# Patient Record
Sex: Female | Born: 1950 | Race: White | Hispanic: No | Smoking: Former smoker
Health system: Southern US, Community
[De-identification: ages and names within clinical notes are randomized; demographics above are authoritative.]

## PROBLEM LIST (undated history)

## (undated) DIAGNOSIS — I219 Acute myocardial infarction, unspecified: Secondary | ICD-10-CM

## (undated) DIAGNOSIS — I509 Heart failure, unspecified: Secondary | ICD-10-CM

## (undated) DIAGNOSIS — I10 Essential (primary) hypertension: Secondary | ICD-10-CM

## (undated) DIAGNOSIS — E119 Type 2 diabetes mellitus without complications: Secondary | ICD-10-CM

## (undated) DIAGNOSIS — T4145XA Adverse effect of unspecified anesthetic, initial encounter: Secondary | ICD-10-CM

## (undated) DIAGNOSIS — I739 Peripheral vascular disease, unspecified: Secondary | ICD-10-CM

## (undated) DIAGNOSIS — D649 Anemia, unspecified: Secondary | ICD-10-CM

## (undated) DIAGNOSIS — M7070 Other bursitis of hip, unspecified hip: Secondary | ICD-10-CM

## (undated) DIAGNOSIS — Z9289 Personal history of other medical treatment: Secondary | ICD-10-CM

## (undated) DIAGNOSIS — E46 Unspecified protein-calorie malnutrition: Secondary | ICD-10-CM

## (undated) DIAGNOSIS — R6521 Severe sepsis with septic shock: Secondary | ICD-10-CM

## (undated) DIAGNOSIS — I639 Cerebral infarction, unspecified: Secondary | ICD-10-CM

## (undated) DIAGNOSIS — J9690 Respiratory failure, unspecified, unspecified whether with hypoxia or hypercapnia: Secondary | ICD-10-CM

## (undated) DIAGNOSIS — M1990 Unspecified osteoarthritis, unspecified site: Secondary | ICD-10-CM

## (undated) DIAGNOSIS — T8859XA Other complications of anesthesia, initial encounter: Secondary | ICD-10-CM

## (undated) DIAGNOSIS — I2510 Atherosclerotic heart disease of native coronary artery without angina pectoris: Secondary | ICD-10-CM

## (undated) DIAGNOSIS — G43909 Migraine, unspecified, not intractable, without status migrainosus: Secondary | ICD-10-CM

## (undated) DIAGNOSIS — M869 Osteomyelitis, unspecified: Secondary | ICD-10-CM

## (undated) DIAGNOSIS — R0902 Hypoxemia: Secondary | ICD-10-CM

## (undated) DIAGNOSIS — G709 Myoneural disorder, unspecified: Secondary | ICD-10-CM

## (undated) DIAGNOSIS — A419 Sepsis, unspecified organism: Secondary | ICD-10-CM

## (undated) DIAGNOSIS — K219 Gastro-esophageal reflux disease without esophagitis: Secondary | ICD-10-CM

## (undated) DIAGNOSIS — Z8489 Family history of other specified conditions: Secondary | ICD-10-CM

## (undated) DIAGNOSIS — E785 Hyperlipidemia, unspecified: Secondary | ICD-10-CM

## (undated) HISTORY — PX: APPENDECTOMY: SHX54

## (undated) HISTORY — PX: CORONARY ANGIOPLASTY: SHX604

## (undated) HISTORY — DX: Hyperlipidemia, unspecified: E78.5

## (undated) HISTORY — PX: OVARY SURGERY: SHX727

## (undated) HISTORY — PX: ABDOMINAL HYSTERECTOMY: SHX81

## (undated) HISTORY — PX: CATARACT EXTRACTION W/ INTRAOCULAR LENS IMPLANT: SHX1309

## (undated) SURGERY — Surgical Case
Anesthesia: *Unknown

---

## 1999-05-07 ENCOUNTER — Other Ambulatory Visit: Admission: RE | Admit: 1999-05-07 | Discharge: 1999-05-07 | Payer: Self-pay | Admitting: *Deleted

## 2000-07-08 ENCOUNTER — Other Ambulatory Visit: Admission: RE | Admit: 2000-07-08 | Discharge: 2000-07-08 | Payer: Self-pay | Admitting: *Deleted

## 2003-05-02 ENCOUNTER — Encounter: Payer: Self-pay | Admitting: Emergency Medicine

## 2003-05-02 ENCOUNTER — Inpatient Hospital Stay (HOSPITAL_COMMUNITY): Admission: AD | Admit: 2003-05-02 | Discharge: 2003-05-03 | Payer: Self-pay | Admitting: Pediatrics

## 2003-05-05 ENCOUNTER — Encounter: Admission: RE | Admit: 2003-05-05 | Discharge: 2003-08-03 | Payer: Self-pay | Admitting: Pediatrics

## 2005-07-04 ENCOUNTER — Emergency Department (HOSPITAL_COMMUNITY): Admission: EM | Admit: 2005-07-04 | Discharge: 2005-07-04 | Payer: Self-pay | Admitting: Family Medicine

## 2007-03-12 ENCOUNTER — Emergency Department (HOSPITAL_COMMUNITY): Admission: EM | Admit: 2007-03-12 | Discharge: 2007-03-12 | Payer: Self-pay | Admitting: Emergency Medicine

## 2008-06-23 ENCOUNTER — Other Ambulatory Visit: Admission: RE | Admit: 2008-06-23 | Discharge: 2008-06-23 | Payer: Self-pay | Admitting: Internal Medicine

## 2008-07-06 ENCOUNTER — Encounter: Admission: RE | Admit: 2008-07-06 | Discharge: 2008-07-06 | Payer: Self-pay | Admitting: Internal Medicine

## 2008-08-04 ENCOUNTER — Encounter: Admission: RE | Admit: 2008-08-04 | Discharge: 2008-08-04 | Payer: Self-pay | Admitting: Internal Medicine

## 2008-08-16 ENCOUNTER — Encounter: Admission: RE | Admit: 2008-08-16 | Discharge: 2008-08-16 | Payer: Self-pay | Admitting: Internal Medicine

## 2009-01-28 DIAGNOSIS — I219 Acute myocardial infarction, unspecified: Secondary | ICD-10-CM

## 2009-01-28 HISTORY — DX: Acute myocardial infarction, unspecified: I21.9

## 2009-10-13 ENCOUNTER — Inpatient Hospital Stay (HOSPITAL_COMMUNITY): Admission: EM | Admit: 2009-10-13 | Discharge: 2009-10-18 | Payer: Self-pay | Admitting: Emergency Medicine

## 2009-10-13 HISTORY — PX: CARDIAC CATHETERIZATION: SHX172

## 2009-10-16 ENCOUNTER — Encounter (INDEPENDENT_AMBULATORY_CARE_PROVIDER_SITE_OTHER): Payer: Self-pay | Admitting: Cardiovascular Disease

## 2009-10-17 HISTORY — PX: TRANSTHORACIC ECHOCARDIOGRAM: SHX275

## 2010-01-26 ENCOUNTER — Ambulatory Visit (HOSPITAL_COMMUNITY)
Admission: RE | Admit: 2010-01-26 | Discharge: 2010-01-26 | Payer: Self-pay | Source: Home / Self Care | Attending: Cardiovascular Disease | Admitting: Cardiovascular Disease

## 2010-01-26 HISTORY — PX: OTHER SURGICAL HISTORY: SHX169

## 2010-02-18 ENCOUNTER — Encounter: Payer: Self-pay | Admitting: Internal Medicine

## 2010-02-19 ENCOUNTER — Encounter: Payer: Self-pay | Admitting: Internal Medicine

## 2010-02-23 ENCOUNTER — Inpatient Hospital Stay (HOSPITAL_COMMUNITY)
Admission: RE | Admit: 2010-02-23 | Discharge: 2010-02-24 | Payer: Self-pay | Source: Home / Self Care | Attending: Cardiovascular Disease | Admitting: Cardiovascular Disease

## 2010-02-23 HISTORY — PX: OTHER SURGICAL HISTORY: SHX169

## 2010-02-23 LAB — CBC
HCT: 38.5 % (ref 36.0–46.0)
Hemoglobin: 13.2 g/dL (ref 12.0–15.0)
MCH: 29.9 pg (ref 26.0–34.0)
MCHC: 34.3 g/dL (ref 30.0–36.0)
MCV: 87.1 fL (ref 78.0–100.0)
Platelets: 180 K/uL (ref 150–400)
RBC: 4.42 MIL/uL (ref 3.87–5.11)
RDW: 12.7 % (ref 11.5–15.5)
WBC: 7.5 K/uL (ref 4.0–10.5)

## 2010-02-23 LAB — POCT ACTIVATED CLOTTING TIME: Activated Clotting Time: 435 s

## 2010-02-23 LAB — BASIC METABOLIC PANEL WITH GFR
BUN: 10 mg/dL (ref 6–23)
CO2: 25 meq/L (ref 19–32)
Calcium: 9.1 mg/dL (ref 8.4–10.5)
Chloride: 106 meq/L (ref 96–112)
Creatinine, Ser: 0.68 mg/dL (ref 0.4–1.2)
GFR calc Af Amer: >60 mL/min (ref >60)
GFR calc non Af Amer: >60 mL/min (ref >60)
Glucose, Bld: 157 mg/dL — ABNORMAL HIGH (ref 70–99)
Potassium: 4.8 meq/L (ref 3.5–5.1)
Sodium: 140 meq/L (ref 135–145)

## 2010-02-23 LAB — GLUCOSE, CAPILLARY: Glucose-Capillary: 151 mg/dL — ABNORMAL HIGH (ref 70–99)

## 2010-02-23 LAB — PROTIME-INR: INR: 1.04 (ref 0.00–1.49)

## 2010-02-24 LAB — BASIC METABOLIC PANEL
Calcium: 8.9 mg/dL (ref 8.4–10.5)
GFR calc Af Amer: >60 mL/min (ref >60)
GFR calc non Af Amer: >60 mL/min (ref >60)
Glucose, Bld: 144 mg/dL — ABNORMAL HIGH (ref 70–99)

## 2010-02-24 LAB — CBC
Hemoglobin: 12 g/dL (ref 12.0–15.0)
Platelets: 158 10*3/uL (ref 150–400)
RBC: 3.98 MIL/uL (ref 3.87–5.11)
WBC: 8.2 10*3/uL (ref 4.0–10.5)

## 2010-03-09 NOTE — Procedures (Signed)
[name], NOBILE [name]  [id number]  MEDICAL RECORD [name]  [id number]          PATIENT TYPE:  INP  LOCATION:  6525                         FACILITY:  [facility]  PHYSICIAN:  [name] [name], M.D.   DATE OF BIRTH:  February 09, 1950  DATE OF PROCEDURE:  02/23/2010 DATE OF DISCHARGE:                   PERIPHERAL VASCULAR INVASIVE PROCEDURE   PROCEDURES:  Right lower extremity angiogram/Diamondback orbital rotational atherectomy/percutaneous transluminal angioplasty and stent.  [name] is a 60 year old mildly overweight, married Caucasian female, mother to one daughter.  She was referred to me by Dr. Tresa Endo for [facility] evaluation.  She has a history of acute myocardial infarction secondary to occluded dominant circumflex.  She underwent PCI and stenting with a Promus drug-eluting stent by Dr. Tresa Endo on October 13, 2009.  She did have some inferior and lateral wall motion abnormalities and found an EF by 2-D echo of 45-50%.  Risk factors include continued tobacco abuse for approximately 40 years, diabetes, hypertension, hyperlipidemia as well as family history.  She was complaining of claudication in the last 2 years, worse in the last 6 months, left greater than right, less than 1 block.  Dopplers performed in our office on December 18, 2009, revealed ABIs in the 0.5 range with what appeared to be an occluded SFAs.  She also had an occluded right internal carotid artery.  Angiogram done on January 26, 2010, revealing an occluded left SFA at the origin down to the [facility] canal three vessel runoff.  She had 90 plus percent calcified distal and mid right SFA stenosis with two-vessel runoff.  She presents now for [facility] orbital rotational atherectomy, PTA and stenting of her popliteal and SFA for lifestyle-limiting claudication.  PROCEDURE DESCRIPTION:  The patient was brought to the Second Floor [facility] Angiographic Suite in the post absorptive state.  She  was premedicated with p.o. Valium, IV Versed, and fentanyl.  Her left groin was prepped and shaved in the usual sterile fashion.  Xylocaine 1% was used for local anesthesia.  A 7-French crossover sheath was inserted into the left femoral artery using standard Seldinger technique.  A 5- [place] crossover catheter was used for contralateral access.  The patient received Angiomax bolus with an ACT of 425.  She was on aspirin and Effient.  The SFA and popliteal were crossed with a 0.014 Spartacore 3.0 semi length wire along with an 0.018 Quick-Cross catheter.  This was placed in the below-the-knee pop.  Following this,  Spartacore wire was exchanged through the Quick-Cross for a 0.014 ViperWire.  Visipaque dye was used for the entirety of the case.  Retrograde aortic pressures were monitored during the case.  Using a 2.0 Stealth Predator orbital rotational atherectomy bur, atherectomy was performed of the mid and distal SFA and popliteal lesion up to 90,000 rpm.  Postdilatation was then performed with a 4 x 80 FoxCross in the popliteal and the SFA area.  Angiography revealed diffuse disease that was high-grade and flow-limiting.  It should be noted that there also was a physiologically significant lesion in the proximal right external iliac artery with at least a 50-75 mm pressure gradient.  Following this, overlapping  5 x 100-mm long Absolute Pro nitinol self-expanding stents were deployed beginning at the area just at the knee, extending back into the mid SFA with the final stenting of 5 x 60 Absolute Pro.  Postdilatation was performed with a 5 x 100 FoxCross balloon at 4-6 atmospheres resulting in reduction of long 90- 95% calcified diffusely diseased SFA and popliteal stenosis to 0% residual with excellent flow.  The sheath was then withdrawn just to the bifurcation and stenting was performed of the distal common and proximal external iliac artery with a 10 x 4 Absolute Pro nitinol  self-expanding stent with gentle postdilatation using a 7 x 3 balloon resulting in reduction of 70% lesion to 0% residual.  The patient tolerated the procedure well.  There was a palpable pulse at the end of the case.  The sheath was then withdrawn across the bifurcation into the left iliac system.  The patient left the lab in stable condition.  Sheath will be removed once the ACT falls below 150.  The patient will remain on aspirin and Effient, will be hydrated overnight and discharged home in the morning.  I will see back in the office in approximately 1 week for followup.  She may need staged left SFA intervention.     [name] [name], M.D.     JB/MEDQ  D:  02/23/2010  T:  02/24/2010  Job:  [id number]  cc:   Second Floor PV Angiographic Suite [facility] Nicki Guadalajara, M.D. Georgann Housekeeper, MD  Electronically Signed by [name] [name] M.D. on 03/09/2010 08:06:53 AM

## 2010-03-09 NOTE — Discharge Summary (Signed)
  NAMELATALIA, [name] [name]                  ACCOUNT NO.:  [id number]  MEDICAL RECORD NO.:  [id number]          PATIENT TYPE:  INP  LOCATION:  6525                         FACILITY:  [facility]  PHYSICIAN:  Nanetta Batty, M.D.   DATE OF BIRTH:  02/02/1950  DATE OF ADMISSION:  02/23/2010 DATE OF DISCHARGE:  02/24/2010                              DISCHARGE SUMMARY   DISCHARGE DIAGNOSES: 1. Claudication. 2. Peripheral vascular disease with occluded superficial femoral     artery.     a.     Diamondback atherectomy and percutaneous transluminal      angioplasty and stent to the right popliteal superficial femoral      artery reducing 95% stenosis to 0.     b.     Percutaneous transluminal angioplasty  stent to the right AI      is 50% stenosis resist to 0. 3. Coronary artery disease with history of myocardial infarction, drug-     eluting stent to the left circumflex in September 2011. 4. Diabetes mellitus. 5. Dyslipidemia.  DISCHARGE CONDITION:  Improved.  PROCEDURE:  Peripheral angiogram as well as percutaneous transluminal angioplasty and stent as stated on February 23, 2010.  DISCHARGE MEDICATIONS:  See medication reconciliation sheet.  We will hold her metformin until February 27, 2010.  DISCHARGE INSTRUCTIONS: 1. Follow up with Dr. Allyson Sabal on March 08, 2010. 2. Follow up with lower extremity Dopplers on March 06, 2010, at 3     p.m.  HOSPITAL COURSE:  The patient presented electively to short stay and underwent PV angiogram by Dr. Allyson Sabal secondary to claudication.  Please see his dictated report.  She has been stable post procedure.  She did bleed some in her groin site.  Extra pressure was held.  She was ambulating without problems prior to discharge.  LABS:  Sodium 138, potassium 4.4, BUN 12, creatinine 0.81, glucose 144. Hemoglobin 12, hematocrit 34.2, platelets 158, WBC 8.2. Blood pressure 107/53.  She is stable and will be discharged home.  Dr. Allyson Sabal has seen her in the  morning of discharge.     Darcella Gasman. Annie Paras, N.P.   ______________________________ Nanetta Batty, M.D.    LRI/MEDQ  D:  02/24/2010  T:  02/25/2010  Job:  [id number]  cc:   Georgann Housekeeper, MD Nicki Guadalajara, M.D.  Electronically Signed by Nada Boozer N.P. on 02/26/2010 03:20:44 PM Electronically Signed by Nanetta Batty M.D. on 03/09/2010 08:06:51 AM

## 2010-04-09 LAB — GLUCOSE, CAPILLARY
Glucose-Capillary: 130 mg/dL — ABNORMAL HIGH (ref 70–99)
Glucose-Capillary: 133 mg/dL — ABNORMAL HIGH (ref 70–99)

## 2010-04-12 LAB — POCT I-STAT, CHEM 8
BUN: 14 mg/dL (ref 6–23)
Chloride: 106 mEq/L (ref 96–112)
Potassium: 3.9 mEq/L (ref 3.5–5.1)
Sodium: 138 mEq/L (ref 135–145)

## 2010-04-12 LAB — BASIC METABOLIC PANEL
BUN: 4 mg/dL — ABNORMAL LOW (ref 6–23)
BUN: 4 mg/dL — ABNORMAL LOW (ref 6–23)
CO2: 22 mEq/L (ref 19–32)
CO2: 23 mEq/L (ref 19–32)
Calcium: 8.6 mg/dL (ref 8.4–10.5)
Calcium: 9 mg/dL (ref 8.4–10.5)
Chloride: 104 mEq/L (ref 96–112)
Chloride: 108 mEq/L (ref 96–112)
Chloride: 109 mEq/L (ref 96–112)
Chloride: 110 mEq/L (ref 96–112)
Creatinine, Ser: 0.54 mg/dL (ref 0.4–1.2)
Creatinine, Ser: 0.56 mg/dL (ref 0.4–1.2)
Creatinine, Ser: 0.66 mg/dL (ref 0.4–1.2)
GFR calc Af Amer: >60 mL/min (ref >60)
GFR calc Af Amer: >60 mL/min (ref >60)
GFR calc non Af Amer: >60 mL/min (ref >60)
GFR calc non Af Amer: >60 mL/min (ref >60)
GFR calc non Af Amer: >60 mL/min (ref >60)
Glucose, Bld: 112 mg/dL — ABNORMAL HIGH (ref 70–99)
Glucose, Bld: 90 mg/dL (ref 70–99)
Potassium: 3.7 mEq/L (ref 3.5–5.1)
Potassium: 3.8 mEq/L (ref 3.5–5.1)
Sodium: 133 mEq/L — ABNORMAL LOW (ref 135–145)
Sodium: 138 mEq/L (ref 135–145)

## 2010-04-12 LAB — CBC
HCT: 33.1 % — ABNORMAL LOW (ref 36.0–46.0)
HCT: 37.4 % (ref 36.0–46.0)
HCT: 40.1 % (ref 36.0–46.0)
HCT: 44.1 % (ref 36.0–46.0)
Hemoglobin: 11.6 g/dL — ABNORMAL LOW (ref 12.0–15.0)
Hemoglobin: 11.9 g/dL — ABNORMAL LOW (ref 12.0–15.0)
Hemoglobin: 13 g/dL (ref 12.0–15.0)
Hemoglobin: 14.3 g/dL (ref 12.0–15.0)
MCH: 31.3 pg (ref 26.0–34.0)
MCH: 31.8 pg (ref 26.0–34.0)
MCH: 31.9 pg (ref 26.0–34.0)
MCHC: 35.8 g/dL (ref 30.0–36.0)
MCHC: 35.8 g/dL (ref 30.0–36.0)
MCV: 87.6 fL (ref 78.0–100.0)
MCV: 89.3 fL (ref 78.0–100.0)
MCV: 89.7 fL (ref 78.0–100.0)
MCV: 91.4 fL (ref 78.0–100.0)
Platelets: 158 10*3/uL (ref 150–400)
Platelets: 159 10*3/uL (ref 150–400)
RBC: 3.8 MIL/uL — ABNORMAL LOW (ref 3.87–5.11)
RBC: 4.09 MIL/uL (ref 3.87–5.11)
RBC: 4.49 MIL/uL (ref 3.87–5.11)
RDW: 12.4 % (ref 11.5–15.5)
RDW: 12.5 % (ref 11.5–15.5)
RDW: 12.8 % (ref 11.5–15.5)
WBC: 11 10*3/uL — ABNORMAL HIGH (ref 4.0–10.5)
WBC: 13.6 10*3/uL — ABNORMAL HIGH (ref 4.0–10.5)
WBC: 13.6 10*3/uL — ABNORMAL HIGH (ref 4.0–10.5)
WBC: 9.7 10*3/uL (ref 4.0–10.5)

## 2010-04-12 LAB — CARDIAC PANEL(CRET KIN+CKTOT+MB+TROPI)
CK, MB: 375.8 ng/mL (ref 0.3–4.0)
Relative Index: 6.8 — ABNORMAL HIGH (ref 0.0–2.5)
Relative Index: 8.6 — ABNORMAL HIGH (ref 0.0–2.5)
Total CK: 2827 U/L — ABNORMAL HIGH (ref 7–177)
Troponin I: 63.95 ng/mL (ref 0.00–0.06)

## 2010-04-12 LAB — CK TOTAL AND CKMB (NOT AT ARMC)
CK, MB: 222.7 ng/mL (ref 0.3–4.0)
Relative Index: 8.3 — ABNORMAL HIGH (ref 0.0–2.5)
Total CK: 2688 U/L — ABNORMAL HIGH (ref 7–177)
Total CK: 92 U/L (ref 7–177)

## 2010-04-12 LAB — GLUCOSE, CAPILLARY
Glucose-Capillary: 130 mg/dL — ABNORMAL HIGH (ref 70–99)
Glucose-Capillary: 138 mg/dL — ABNORMAL HIGH (ref 70–99)
Glucose-Capillary: 138 mg/dL — ABNORMAL HIGH (ref 70–99)
Glucose-Capillary: 151 mg/dL — ABNORMAL HIGH (ref 70–99)
Glucose-Capillary: 175 mg/dL — ABNORMAL HIGH (ref 70–99)
Glucose-Capillary: 182 mg/dL — ABNORMAL HIGH (ref 70–99)
Glucose-Capillary: 204 mg/dL — ABNORMAL HIGH (ref 70–99)
Glucose-Capillary: 207 mg/dL — ABNORMAL HIGH (ref 70–99)
Glucose-Capillary: 213 mg/dL — ABNORMAL HIGH (ref 70–99)
Glucose-Capillary: 216 mg/dL — ABNORMAL HIGH (ref 70–99)
Glucose-Capillary: 253 mg/dL — ABNORMAL HIGH (ref 70–99)
Glucose-Capillary: 70 mg/dL (ref 70–99)

## 2010-04-12 LAB — DIFFERENTIAL
Basophils Absolute: 0 10*3/uL (ref 0.0–0.1)
Eosinophils Relative: 1 % (ref 0–5)
Lymphocytes Relative: 29 % (ref 12–46)
Lymphs Abs: 4 10*3/uL (ref 0.7–4.0)
Monocytes Absolute: 1 10*3/uL (ref 0.1–1.0)
Monocytes Relative: 7 % (ref 3–12)

## 2010-04-12 LAB — COMPREHENSIVE METABOLIC PANEL
ALT: 62 U/L — ABNORMAL HIGH (ref 0–35)
AST: 222 U/L — ABNORMAL HIGH (ref 0–37)
Alkaline Phosphatase: 92 U/L (ref 39–117)
CO2: 22 mEq/L (ref 19–32)
GFR calc Af Amer: >60 mL/min (ref >60)
GFR calc non Af Amer: >60 mL/min (ref >60)
Glucose, Bld: 226 mg/dL — ABNORMAL HIGH (ref 70–99)
Potassium: 4.2 mEq/L (ref 3.5–5.1)
Sodium: 137 mEq/L (ref 135–145)
Total Protein: 6.8 g/dL (ref 6.0–8.3)

## 2010-04-12 LAB — HEMOGLOBIN A1C: Hgb A1c MFr Bld: 8.7 % — ABNORMAL HIGH (ref <5.7)

## 2010-04-12 LAB — BRAIN NATRIURETIC PEPTIDE
Pro B Natriuretic peptide (BNP): 172 pg/mL — ABNORMAL HIGH (ref 0.0–100.0)
Pro B Natriuretic peptide (BNP): 565 pg/mL — ABNORMAL HIGH (ref 0.0–100.0)

## 2010-04-12 LAB — URINALYSIS, ROUTINE W REFLEX MICROSCOPIC
Glucose, UA: 250 mg/dL — AB
Specific Gravity, Urine: 1.007 (ref 1.005–1.030)
pH: 6.5 (ref 5.0–8.0)

## 2010-04-12 LAB — TROPONIN I
Troponin I: 0.18 ng/mL — ABNORMAL HIGH (ref 0.00–0.06)
Troponin I: 95.26 ng/mL (ref 0.00–0.06)

## 2010-04-12 LAB — URINE MICROSCOPIC-ADD ON

## 2010-04-12 LAB — URINE CULTURE

## 2010-04-12 LAB — LIPID PANEL
Total CHOL/HDL Ratio: 4.8 RATIO
Triglycerides: 213 mg/dL — ABNORMAL HIGH (ref <150)
VLDL: 43 mg/dL — ABNORMAL HIGH (ref 0–40)

## 2010-04-12 LAB — MRSA PCR SCREENING: MRSA by PCR: NEGATIVE

## 2010-04-12 LAB — TSH: TSH: 0.634 u[IU]/mL (ref 0.350–4.500)

## 2010-06-15 NOTE — Discharge Summary (Signed)
NAME:  [name] [name], [name] [name]                            ACCOUNT NO.:  [id number]   MEDICAL RECORD NO.:  [id number]                   PATIENT TYPE:  INP   LOCATION:  3002                                 FACILITY:  [facility]   PHYSICIAN:  Genene Churn. Love, M.D.                 DATE OF BIRTH:  02/25/1950   DATE OF ADMISSION:  05/02/2003  DATE OF DISCHARGE:  05/03/2003                                 DISCHARGE SUMMARY   PATIENT ADDRESS:  [address], [place], [place] [address]   This was the first [facility] for this 60 year old right-handed,  white, married female admitted May 02, 2003, by Dr. Sharene Skeans for evaluation  of suspected left body seizure.   HISTORY OF PRESENT ILLNESS:  This patient was sitting on the commode about 5  a.m. the day of admission having to urinate and experienced left truncal  pain with jerking and posturing of the left arm and leg, both of which were  extended.  There was no evidence of associated loss of consciousness.  The  left leg was internally rotated in the equinus position, and there was  jerking movement of the arm and leg.  This lasted for about 30 seconds to 2  minutes.  It was felt this most likely represented a form of simple partial  seizure. There was no loss of consciousness, and there as no evidence of  urinary incontinence, and there was no tongue biting.  There was no  postictal state such as left-sided weakness.   PAST MEDICAL HISTORY:  Significant for no prior history of seizures or  strokes.  She does have a known history of plantar fasciitis, infrequent  migraines, and tension headaches.  Prior to her admission, she had been  placed on a course of steroids for shingles which had recently been  discontinued.   MEDICATIONS ON ADMISSION:  Included ibuprofen.   ALLERGIES:  No known history of drug allergies.   SOCIAL HISTORY:  The patient [facility]  [address]. She smokes one-half pack per day  of cigarettes which she has  done for 32 years. She does not use alcohol.  She is married and has  children.  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   PHYSICAL EXAMINATION:  Her examination at time of admission was  unremarkable.   LABORATORY AND X-RAY DATA:  Her laboratory data revealed a white blood cell  count of 8700, hemoglobin 17.0, hematocrit 48.6, platelet count 192,000.  Differential was 73% polys, 19% lymphs, 6% monos, 2% eosinophils.  Her pro  time revealed 13.0, INR 1.0, PTT 30. Sodium 134, potassium 4.1, chloride  103, CO2 content 27, glucose high at 334, BUN 9, creatinine 0.7, total  bilirubin 0.3, alkaline phosphatase 144, SGOT 38, SGPT 37, total protein  7.3, albumin 3.7, calcium 10.1, hemoglobin A1C high at 13.2.  Her  homocysteine level was 10.47.  Urine drug screen was unremarkable.  Her  urinalysis revealed greater than 1000 mg glucose and negative nitrites and  leukocytes.  A lipid profile is pending.   Other laboratory data included a MRI study of the brain performed with and  without contrast enhancement showed mild sinusitis changes in the ethmoid  air cells and maxillary sinuses.  There were some nonspecific subcortical  white matter hyperintense changes noted in the anterior parietal regions  bilaterally.  They were nonspecific in terms of etiology and could represent  small vessel changes, either from hypertension or diabetes.  There was no  evidence of an acute stroke.   Intracranial MRA of the brain showed portions of both internal carotid  arteries to have adequate caliber and flow.  There was mild to moderate  caliber irregularity involving the cavernous portions of both internal  carotids.  The supraclinoid portions were normal bilaterally.  The right MCA  and its mid M1 segment demonstrated signal drop off with decreased caliber.  The trifurcation branches appear to be patent.  The  right anterior cerebral  artery and the left anterior cerebral artery both alternate adequate caliber  and flow signal.  The EA column was thought to be unremarkable.  Left M1  segment appeared to be somewhat short probably on a congenital basis.  Left  MCA trifurcation was  within normal limits.  The vertebrobasilar junctions  were codominant with flow signal and demonstrated both posterior inferior  cerebellar arteries present.  The basilar artery, the right posterior  cerebral and superior cerebral arteries demonstrated adequate caliber and  flow signal.  Overall the impression on the MRA was signal drop off with  significantly decreased caliber of the right M1 segment in its mid portion.  This could represent hemodynamically significant stenosis, very focal in  nature, with vessel tortuosity throughout.  There was no definite acute  stroke seen in that distribution.  There was also irregularity in the  cavernous segments of the internal carotid arteries which could represent  changes related to atherosclerosis.   MRV examination showed that there was no evidence of any superior sagittal  sinus clotting.  There was suggestion of mildly severe stenosis of the  posterior one-third of the superior saggital sinus and some signal drop off  in the proximal left transverse sinus that could represent some focal  stenosis.  This raised the question, if she had papiledema, that she could  have pseudotumor cerebri.   CT scan of the head at [facility] on May 02, 2003, showed a  hyperdense straight sinus, question of thrombus, and RV venography was  recommended.  There could have been a subtle infarct along the left basal  ganglia posterior to the left internal capsule and possibly involving the  subcortical region of the left frontal lobe could not be excluded on the CT  scan.   Chest x-ray showed no evidence of acute abnormality.  Telemetry showed normal sinus rhythm.   EKG  showed normal sinus rhythm with prolonged QT.   HOSPITAL COURSE:  The patient was admitted for suspected right brain, left  body focal seizure.  No acute stroke was seen on the MRI, but it was noted  that she had right MCA stenosis and now was found to have evidence of  diabetes mellitus.  It was not clear as to the cause of her right brain,  left  body focal seizure but could have been related to the MCA stenosis plus  the recently discovered diabetes mellitus.  Her capillary blood glucose is  ranging 340 to 195.  She did not have a physician.  The diabetes coordinator  was contacted who saw the patient in the hospital and instructed the patient  in diabetes and set up a Glucometer training session on Thursday, May 05, 2003.  The patient is to obtain a medical physician as an outpatient.   The patient had no further symptoms or signs in the hospital, but seizure  precautions were discussed with the patient and not driving a car.  She is  to stay out of work until May 09, 2003.   She did have an EEG in the hospital which was normal during the awake state.   IMPRESSION:  1. Right brain, left body focal simple partial seizure (Code 345.50).  2. 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  3. New onset diabetes mellitus (Code 250.60).  4. Recent history of shingles requiring steroid therapy (Code unknown).  5. Possible hypertension (Code 796.2).  6. Morbid obesity (Code 278.01).   PLAN:  1. The plan at this time is to discharge the patient on seizure precautions,     not driving a car.  2. She is to take 1 baby aspirin per day.  3. Metformin 500 mg 1 tablet p.o. b.i.d.  4. She is on a diabetic diet with 45 g of carbohydrates per meal.  5. She is to have Glucometer training on May 05, 2003.  6. She is not to drive a car until she returns to see Dr. Sharene Skeans in one     month.  7. She is to stay out of work until May 09, 2003.  8. She is  discharged improved from pre-hospital status.                                                Genene Churn. Sandria Manly, M.D.    JML/MEDQ  D:  05/03/2003  T:  05/04/2003  Job:  [id number]   cc:   [facility]. Sharene Skeans, M.D.  [address]  [address]  [place]  [place] [address]  Fax: [phone]

## 2010-06-15 NOTE — H&P (Signed)
NAME:  [name] [name], HOGAN                            ACCOUNT NO.:  [id number]   MEDICAL RECORD NO.:  [id number]                   PATIENT TYPE:  EMS   LOCATION:  ED                                   FACILITY:  [facility]   PHYSICIAN:  Deanna Artis. Sharene Skeans, M.D.           DATE OF BIRTH:  10/16/1950   DATE OF ADMISSION:  05/02/2003  DATE OF DISCHARGE:                                HISTORY & PHYSICAL   CHIEF COMPLAINT:  Shaking, posturing of my left arm and leg.   HISTORY OF PRESENT ILLNESS:  The patient is a 60 year old married white  woman awakened around 0500 having to urinate.  She was incontinent of urine.  While sitting on the commode, she experienced left truncal pain and then had  jerking and posturing of the left arm and leg.  Both were extended.  The arm  was inwardly rotated, the hand was semiflexed.  The left leg was internally  rotated in an equinus position.  There was jerking movement of the arm and  leg.  This lasted for 30 seconds to about two minutes.  She did not lose  consciousness, nor did she have postictal confusion.  She did not bite her  tongue and did not have further incontinence.  She did not have a Todd's  paresis.  The left side was tender in the aftermath. She did not complain of  a headache.   PAST MEDICAL HISTORY:  No prior seizures, stroke.  She does have plantar  fasciitis, infrequent migraines, and somewhat more frequent tension  headaches, no closed head injury or meningitis.   PAST SURGICAL HISTORY:  Hysterectomy and appendectomy in 1996, exploratory  laparoscopy for a ruptured ovarian cyst in 1985.   MEDICATIONS:  P.r.n. ibuprofen.   ALLERGIES:  No known drug allergies.   REVIEW OF SYSTEMS:  The patient has normal appetite and sleep.  No  complaints of head and neck.  LUNGS:  No COPD.  HEART:  No hypertension,  angina, or heart attack.  GASTROINTESTINAL:  No nausea, vomiting, or  diarrhea.  GENITOURINARY:  No urinary tract infection or hematuria.  MUSCULOSKELETAL:  No fractures or deformities.  SKIN:  No rash or  neurocutaneous lesions.  HEMATOLOGIC:  No anemia or bruisability.  ENDOCRINE:  No known diabetes or thyroid disease.  ALLERGY/IMMUNOLOGY:  No  known environmental allergies.  NEUROPSYCHE:  No depression or anxiety.  NEUROLOGY:  No prior neurologic complaints. The patient is morbidly obese.   12-system review is otherwise negative.   FAMILY HISTORY:  Father had myocardial infarctions and cerebrovascular  accidents.  Maternal great uncle cerebrovascular accidents.  There is a  history of hypertension.   SOCIAL HISTORY:  The patient is followed intermittently through [facility].  She smokes 1-1/2 packs per day and has done so for 32 years.  No  use of alcohol.  She is married and has children.  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   PHYSICAL EXAMINATION:  VITAL SIGNS:  Blood pressure 189/99, pulse 103,  respirations 22, temperature 98, pulse oximetry 95% on room air.  HEENT:  No signs of infection, no bruits, no meningismus.  LUNGS:  Clear.  HEART:  No murmurs.  ABDOMEN:  Protuberant with active bowel sounds.  Nontender. Abdomen soft  with no hepatosplenomegaly.  EXTREMITIES:  Normal.  NEUROLOGY:  Awake, alert, and no dysphagia.  Normal memory, orientation,  fund of knowledge, concentration, and language.  Cranial nerves; pupils  right greater than left phsyiologic, right 3.3 mm, left 2.5 mm, both  reactive.  Fundi were normal.  Visual fields full to double simultaneous  stimuli.  Symmetric facial strength.  Midline tongue and uvula. Air  conduction greater than bone conduction.  Motor examination; normal  strength, tone, and mass.  Good fine motor movements, no pronator drift.  Sensation; stocking polyneuropathy, good stereognosis.  Cerebellar  examination; good finger-to-nose, rapid fine movements.  Gait is normal, she  could get up on her toes.  I did not  ask her to get up on her heels.  She  has normal balance.  Deep tendon reflexes normal, biceps diminished to  absent in upper extremities.  She seemed to have a crossed adductor tapping  the right knee to the left abducted, but the left patellar reflex was  absent.  The ankles were absent.  She had bilateral flexor plantar  responses.   IMPRESSION:  1. Single seizure, not definitely epilepsy.  Simple partial motor type right     brain signature.  780.39  2. Hypertension.  3. Newly identified diabetes mellitus, not treated.  4. Morbid obesity.  5. CT scan is probably normal, but there is a right signal in the superior     sagittal sinus which needs to be evaluated.   PLAN:  The patient will be admitted and have an EEG today. She will have an  MRI of the brain without and with contrast, MRA intracranial, MRV  intracranial to look for vascular abnormalities.  We will sedate her with  Xanax for this. She will have an evaluation for metabolic syndrome including  hemoglobin A1C, lipid panel, and serum homocysteine.  We will have her seen  by the diabetic treatment center while she is in the hospital.                                                Deanna Artis. Sharene Skeans, M.D.    [facility]  D:  05/02/2003  T:  05/02/2003  Job:  [id number]

## 2010-06-15 NOTE — Procedures (Signed)
CLINICAL HISTORY:  The patient is a 60 year old with a single seizure, not  definitely epilepsy. Study is being done to look for the presence of a  seizure disorder. The patient had left sided posturing and jerking.   PROCEDURE:  The tracing was carried out on a 32-channel digital Cadwell  recorder reformatted into 16-channel montages with one devoted to EKG. The  patient was awake and drowsy during the recording. The International 10/20  system lead placement was used.   DESCRIPTION OF FINDINGS:  Dominant frequency is 9 hertz, 30 microvolt  activity that is well regulated and attenuates partially with eye opening.   Background activity is consistent with a mixture of alpha and theta range  components that are broadly distributed with strongly predominant under 20  microvolt beta range activity.   The patient becomes drowsy with increasing theta activity in the background.  Intermittent photo stimulation was carried out and failed to induce a  definite driving response. Hyperventilation was carried out and caused  little change in background activity, except the patient remained awake  throughout. There was no focal slowing. There was no intraictal epileptiform  activity in the form of spikes or sharp waves.   EKG showed a regular sinus rhythm with a ventricular response of 87 beats  per minute.   IMPRESSION:  Normal waking and drowsy record.    WILLIAM H. Sharene Skeans, M.D.   ZOX:WRUE  D:  05/02/2003 22:47:04  T:  05/03/2003 09:41:52  Job #:  [id number]

## 2010-06-15 NOTE — Procedures (Signed)
[name], [name]                  ACCOUNT NO.:  [id number]   MEDICAL RECORD NO.:  [id number]          PATIENT TYPE:  AMB   LOCATION:  SDS                          FACILITY:  [facility]   PHYSICIAN:  Nanetta Batty, M.D.   DATE OF BIRTH:  March 07, 1950   DATE OF PROCEDURE:  DATE OF DISCHARGE:  01/26/2010                    PERIPHERAL VASCULAR INVASIVE PROCEDURE    [name] [name] is a 60 year old mildly overweight married Caucasian female,  mother of 1 daughter, sent to me by Dr. Tresa Endo because of peripheral  vascular evaluation secondary to claudication.  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 Tresa Endo  on October 13, 2009.  She did have inferior lateral wall motion  abnormalities at that time with an EF on 2-D echo of 45-50%.  Her risk  factors include recently  discontinued tobacco abuse, smoked 40 pack  years, diabetes, hypertension, and hyperlipidemia.  There is a strong  family history of  heart disease in her father who had MI as well as  brother.  She has never had a clinical stroke, but on MRI has had old  stroke.  She is complaining of claudication for the last 2 years, worse  over the last 6 months, left greater than right with less than 1 block.  Dopplers done in the office performed on December 18, 2009, revealed an  ABI of 0.5 with occluded SFAs bilaterally.  Her Dopplers also showed  occluded right internal carotid artery.  She presents now for  angiography and potential endovascular therapy.   PROCEDURE DESCRIPTION:  The patient was brought to the Second Floor  [place] [facility] Suite in the postabsorptive state.  She was  premedicated with p.o. Valium, IV Versed and fentanyl.  Her right groin  was prepped and shaved in usual sterile fashion.  Xylocaine 1% was used  for local anesthesia.  A 5-French sheath was inserted into the right  femoral artery  using standard Seldinger technique under a direct  fluoroscopic control because of difficult to palpate pulse and  fluoroscopic calcification.  A 5-French tennis racket catheter was  advanced into the distal abdominal aorta.  Abdominal aortography with  bifemoral runoff was then performed using bolus chase digital  subtraction step table technique.  Visipaque dye was used for the  entirety of the case.  Retrograde aortic pressure was monitored during  the case.   ANGIOGRAPHIC RESULTS:  1. Abdominal aorta.      a.     Renal artery - normal.      b.     Infrarenal abdominal aorta - normal.  2. Left lower extremity.      a.     Total SFA at the origin with reconstitution in [place] canal       and two-vessel runoff.  3. Right lower extremity;      a.     90% mid right SFA stenosis with large area of calcification       adjacent to this and three-vessel runoff.   IMPRESSION:  [name] [name] has high-grade SFA disease left greater than  right.  I believe her left SFA would require fem-pop bypass grafting,  her right could be stented though she might require Diamondback orbital  rotational atherectomy to debulk because of the fluoroscopic  calcification.  Sheath was removed and pressure was held on the  groin to achieve hemostasis.  The patient left lab in stable condition.  She will be gently hydrated, discharged home later today as an  outpatient, and we will see her back in the office in 1 or 2 weeks for  followup.  She left the lab in stable condition.      Nanetta Batty, M.D.      Cordelia Pen  D:  01/26/2010  T:  01/27/2010  Job:  [id number]   cc:   Second Floor [facility]  [facility]  Nicki Guadalajara, M.D.  Georgann Housekeeper, MD  Sigmund I. Patsi Sears, M.D.   Electronically Signed by Nanetta Batty M.D. on 02/14/2010 05:40:39 PM

## 2010-06-17 HISTORY — PX: OTHER SURGICAL HISTORY: SHX169

## 2010-06-18 ENCOUNTER — Ambulatory Visit (HOSPITAL_COMMUNITY)
Admission: RE | Admit: 2010-06-18 | Discharge: 2010-06-19 | Disposition: A | Discharge status: Home / Self Care | Payer: 59 | Source: Ambulatory Visit | Attending: Cardiovascular Disease | Admitting: Cardiovascular Disease

## 2010-06-18 ENCOUNTER — Ambulatory Visit (HOSPITAL_COMMUNITY): Payer: 59

## 2010-06-18 DIAGNOSIS — Z01818 Encounter for other preprocedural examination: Secondary | ICD-10-CM | POA: Insufficient documentation

## 2010-06-18 DIAGNOSIS — T82898A Other specified complication of vascular prosthetic devices, implants and grafts, initial encounter: Secondary | ICD-10-CM | POA: Insufficient documentation

## 2010-06-18 DIAGNOSIS — Z01810 Encounter for preprocedural cardiovascular examination: Secondary | ICD-10-CM | POA: Insufficient documentation

## 2010-06-18 DIAGNOSIS — E119 Type 2 diabetes mellitus without complications: Secondary | ICD-10-CM | POA: Insufficient documentation

## 2010-06-18 DIAGNOSIS — I2510 Atherosclerotic heart disease of native coronary artery without angina pectoris: Secondary | ICD-10-CM | POA: Insufficient documentation

## 2010-06-18 DIAGNOSIS — Z01812 Encounter for preprocedural laboratory examination: Secondary | ICD-10-CM | POA: Insufficient documentation

## 2010-06-18 DIAGNOSIS — I6529 Occlusion and stenosis of unspecified carotid artery: Secondary | ICD-10-CM | POA: Insufficient documentation

## 2010-06-18 DIAGNOSIS — I252 Old myocardial infarction: Secondary | ICD-10-CM | POA: Insufficient documentation

## 2010-06-18 DIAGNOSIS — I70219 Atherosclerosis of native arteries of extremities with intermittent claudication, unspecified extremity: Secondary | ICD-10-CM | POA: Insufficient documentation

## 2010-06-18 DIAGNOSIS — Y831 Surgical operation with implant of artificial internal device as the cause of abnormal reaction of the patient, or of later complication, without mention of misadventure at the time of the procedure: Secondary | ICD-10-CM | POA: Insufficient documentation

## 2010-06-18 DIAGNOSIS — I10 Essential (primary) hypertension: Secondary | ICD-10-CM | POA: Insufficient documentation

## 2010-06-18 DIAGNOSIS — E785 Hyperlipidemia, unspecified: Secondary | ICD-10-CM | POA: Insufficient documentation

## 2010-06-18 DIAGNOSIS — I700 Atherosclerosis of aorta: Secondary | ICD-10-CM | POA: Insufficient documentation

## 2010-06-18 LAB — GLUCOSE, CAPILLARY
Glucose-Capillary: 157 mg/dL — ABNORMAL HIGH (ref 70–99)
Glucose-Capillary: 202 mg/dL — ABNORMAL HIGH (ref 70–99)
Glucose-Capillary: 217 mg/dL — ABNORMAL HIGH (ref 70–99)

## 2010-06-19 LAB — BASIC METABOLIC PANEL
BUN: 14 mg/dL (ref 6–23)
Calcium: 9.4 mg/dL (ref 8.4–10.5)
GFR calc non Af Amer: >60 mL/min (ref >60)
Potassium: 3.9 mEq/L (ref 3.5–5.1)

## 2010-06-19 LAB — GLUCOSE, CAPILLARY: Glucose-Capillary: 138 mg/dL — ABNORMAL HIGH (ref 70–99)

## 2010-06-19 LAB — CBC
HCT: 35.4 % — ABNORMAL LOW (ref 36.0–46.0)
Hemoglobin: 12.4 g/dL (ref 12.0–15.0)
MCH: 30.8 pg (ref 26.0–34.0)
MCHC: 35 g/dL (ref 30.0–36.0)
MCV: 88.1 fL (ref 78.0–100.0)
Platelets: 168 10*3/uL (ref 150–400)
RBC: 4.02 MIL/uL (ref 3.87–5.11)
RDW: 13.3 % (ref 11.5–15.5)
WBC: 9.1 10*3/uL (ref 4.0–10.5)

## 2010-06-21 NOTE — Procedures (Signed)
[name] [name], [name] [name]                  ACCOUNT NO.:  [id number]  MEDICAL RECORD NO.:  [id number]           PATIENT TYPE:  O  LOCATION:  6529                         FACILITY:  [facility]  PHYSICIAN:  Nanetta Batty, M.D.   DATE OF BIRTH:  May 22, 1950  DATE OF PROCEDURE: DATE OF DISCHARGE:  06/17/2010                   PERIPHERAL VASCULAR INVASIVE PROCEDURE   PROCEDURE:  Peripheral angiogram/percutaneous transluminal angiography stent procedure.  HISTORY OF PRESENT ILLNESS:  [name] is a 60 year old mildly overweight married Caucasian female mother of one daughter who is a cardiology patient of Dr. Tresa Endo.  She has a history of CAD status post myocardial infarction secondary to occluded dominant circumflex, PCI, and stenting by Dr. Tresa Endo on October 13, 2009, with a drug-eluting stent.  She did have some inferior and lateral wall motion abnormalities with an EF by 2- D echo of 45-50%.  Risk factors include discontinued tobacco abuse having smoked for 40 pack years, diabetes, hypertension, hyperlipidemia. I performed Diamondback orbital rotational atherectomy, PTA and stenting of right popliteal SFA February 23, 2010, as well as stenting of the right external iliac artery which resulted in improvement of her symptoms as well as her ABIs from 0.5-0.83.  Several days prior to that office visit, she came to the office with a painful dominant right leg. Her Dopplers performed on Jun 14, 2010, that showed high-grade iliac and popliteal stenosis.  She presents now for angiography and potential reintervention.  PROCEDURE DESCRIPTION:  The patient was brought to Second Floor [place] [facility] Suite in a postabsorptive state.  She was premedicated with p.o. Valium, IV Versed and fentanyl.  Her left groin was prepped and shaved in usual sterile fashion.  Xylocaine 1% was used for local anesthesia.  A 5 upgraded to a 6-French sheath was inserted into the left femoral artery using standard  Seldinger technique under direct fluoroscopic control given the diffuse calcification of the common femoral artery on the left.  A 5-French pigtail catheter was used for abdominal aortography with bifemoral runoff using bolus chase digital subtraction step table technique.  Visipaque dye was used for the entirety of the case.  Retrograde aortic pressures were monitored during the case.  ANGIOGRAPHIC RESULTS: 1. Abdominal aorta normal. 2. Left lower extremity;     a.     80% fairly focal and calcified left external iliac artery      stenosis at the bifurcation.     b.     Occlusion of the left common femoral artery by the catheter      suggesting high-grade disease.     c.     Short segment occlusion left SFA mid with two-vessel runoff. 3. Right lower extremity;     a.     95% "in-stent" restenosis within the distal third of the      right external iliac artery stent.     b.     60-70% segmental proximal right SFA stenosis.     c.     95% diffuse in-stent restenosis within the mid and distal      third of the previously placed nitinol self-expanding stents  within the mid and distal right SFA with three-vessel runoff.  PROCEDURE DESCRIPTION:  Contralateral access was obtained with a crossover catheter, Versacore followed by a Rosen wire and a 6-French destination sheath.  The patient received Angiomax bolus with ACT of 515.  The Versacore wire was then advanced through the nitinol stents in the SFA and popliteal down to below the knee.  PTA was performed within the right external iliac artery stent improving inflow.  PTA was then performed within the right popliteal and SFA stent using a 5 x 100 balloon at 10 atmospheres throughout the entirety of the stented segment.  Stenting was then performed of the right external iliac artery using a 6 x 18 Genesis on Opta balloon stent premount and postdilatation with a 7 x 2 balloon resulting reduction of a 95% "in-stent"  restenosis within the distal portion of the right external iliac artery stent to 0% residual and 95% diffuse in-stent restenosis within the right SFA and popliteal stent to less than 20-30% residual.  There was excellent runoff.  There was a fairly focal 80% lesion just at the distal edge of the above-the-knee popliteal stent.  A total of 170 mL of contrast was used for the case.  The sheath was then withdrawn across the bifurcation.  The patient left the lab in stable condition.  Sheath will be removed in several hours.  The patient will be hydrated, treated with aspirin and Plavix, discharged home in the morning.  She will get followup Dopplers and will see me back.  She has a recurrent in-stent restenosis within the SFA popliteal stent, one option would be iodine stent grafting versus below-the-knee pop bypass grafting.     Nanetta Batty, M.D.     Cordelia Pen  D:  06/18/2010  T:  06/18/2010  Job:  [id number]  cc:   Laurell Josephs Second Floor [facility] Nicki Guadalajara, M.D. Georgann Housekeeper, MD  Electronically Signed by Nanetta Batty M.D. on 06/21/2010 03:43:44 PM

## 2010-07-11 NOTE — Discharge Summary (Signed)
[name] [name], [name] [name]                  ACCOUNT NO.:  [id number]  MEDICAL RECORD NO.:  [id number]           PATIENT TYPE:  O  LOCATION:  6529                         FACILITY:  [facility]  PHYSICIAN:  [name] [name], M.D.   DATE OF BIRTH:  July 06, 1950  DATE OF ADMISSION:  06/18/2010 DATE OF DISCHARGE:  06/19/2010                              DISCHARGE SUMMARY   DISCHARGE DIAGNOSES: 1. Claudication, left greater than right lower extremity. 2. Peripheral arterial disease with right external iliac artery stent,     now occluded 95% in-stent restenosis, right superficial femoral     artery stenosis undergoing percutaneous transluminal angioplasty .     a.     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 3. Coronary artery disease with history of drug-eluting stent. 4. Diabetes mellitus with peripheral neuropathy. 5. Dyslipidemia.DISCHARGE CONDITION:  Stable.  PROCEDURES:  PV angiogram by Dr. Nanetta [name].  Jun 18, 2010, PTA and stent for in-stent restenosis to the right EIA and right SFA PTA only for in-stent restenosis.  She does have in-stent restenosis in the SFA-popliteal stent.  Dr. Allyson [name] will decide whether Viabahn stent graft versus below-the-knee pop bypass graft will be needed.  HOSPITAL COURSE:  A 60 year old patient of Dr. Allyson [name], who also has coronary artery disease who sees Dr. Tresa [name], presented to his office secondary to claudication.  She has a history of MI secondary to occluded dominant circ, undergoing PCI and stenting with a drug-eluting stent in September 2011.  She had inferior and lateral wall motion abnormalities at that time with echo showing EF of 45-50%.  She has stopped smoking and she does have diabetes, hypertension, and hyperlipidemia.  She also has occluded right internal carotid artery. In December, she had a PV angiogram revealing occluded left SFA from  its origin down to the [facility] canal with three-vessel runoff.  She underwent rotational atherectomy and PTA stenting with right popliteal and stenting of her right external iliac artery.  Her symptoms were improved and her ABIs increased from 0.5 to 0.83, but prior to this admission, she had increased pain and numbness in her right lower extremity and follow up Dopplers performed in May 2012 showed occluded iliac artery and high-grade popliteal disease as well and she presented for PV angiogram which she underwent with results as stated, undergoing PTA and stent.  By the next morning, she was stable, was seen by Dr. Clarene [name]. Blood pressure was stable 99-100/56, pulse 71, respiratory rate 17, temperature 98.4, and oxygen saturation 95% on room air.  Sodium 140, potassium 3.9, BUN 14, creatinine 0.80, and glucose 122.  Hemoglobin 12.4, hematocrit 35.4, platelets 168, and WBC 9.1.  Heart, regular rate and rhythm.  Lungs were clear with occasional crackles.  Abdomen soft. Extremities; right foot warm and 2+ pedal.  Left foot cold and 1+ to trace pedal.  Dr. Clarene [name] discussed diet, exercise, diabetes with the patient and her daughter.  She stopped smoking 1 year ago.  She ambulated and was ready for discharge home.  She will follow up with Dr. Allyson [name].  DISCHARGE INSTRUCTIONS: 1. Activity as tolerated.  Increase activity slowly.  May shower.  No     lifting for 1 week.  No driving for 2 days. 2. Low-sodium, heart-healthy, diabetic diet. 3. Wash cath site with soap and water.  Call if any bleeding,     swelling, or drainage. 4. Follow with Dr. Allyson [name] on July 17, 2010, at 10:45 a.m. your Dopplers     are July 12, 2010, at 3:00 p.m.  Medication reconciliation sheet:  Please see medication list per [facility] computer.  Medications remain the same.  She was already on furosemide, metoprolol, nitroglycerin, oxycodone, and potassium as an outpatient.     [name] [name]. [name] [name],  N.P.   ______________________________ [name] [name], M.D.    LRI/MEDQ  D:  06/19/2010  T:  06/20/2010  Job:  [id number]  Electronically Signed by [name] [name] N.P. on 06/27/2010 04:36:45 PM Electronically Signed by [name] [name] M.D. on 07/11/2010 10:44:02 AM

## 2010-10-25 HISTORY — PX: OTHER SURGICAL HISTORY: SHX169

## 2011-03-19 HISTORY — PX: OTHER SURGICAL HISTORY: SHX169

## 2011-05-29 ENCOUNTER — Other Ambulatory Visit: Payer: Self-pay | Admitting: Cardiovascular Disease

## 2011-05-29 ENCOUNTER — Ambulatory Visit
Admission: RE | Admit: 2011-05-29 | Discharge: 2011-05-29 | Disposition: A | Discharge status: Home / Self Care | Payer: 59 | Source: Ambulatory Visit | Attending: Cardiovascular Disease | Admitting: Cardiovascular Disease

## 2011-05-29 DIAGNOSIS — I270 Primary pulmonary hypertension: Secondary | ICD-10-CM

## 2011-05-31 ENCOUNTER — Encounter (HOSPITAL_COMMUNITY): Payer: Self-pay | Admitting: Pharmacy Technician

## 2011-06-04 ENCOUNTER — Encounter (HOSPITAL_COMMUNITY): Payer: Self-pay

## 2011-06-04 ENCOUNTER — Encounter (HOSPITAL_COMMUNITY)
Admission: RE | Disposition: A | Discharge status: Home / Self Care | Payer: Self-pay | Source: Ambulatory Visit | Attending: Cardiovascular Disease

## 2011-06-04 ENCOUNTER — Inpatient Hospital Stay (HOSPITAL_COMMUNITY)
Admission: RE | Admit: 2011-06-04 | Discharge: 2011-06-06 | DRG: 301 | Disposition: A | Discharge status: Home / Self Care | Payer: 59 | Source: Ambulatory Visit | Attending: Cardiovascular Disease | Admitting: Cardiovascular Disease

## 2011-06-04 DIAGNOSIS — Z9861 Coronary angioplasty status: Secondary | ICD-10-CM

## 2011-06-04 DIAGNOSIS — Z79899 Other long term (current) drug therapy: Secondary | ICD-10-CM

## 2011-06-04 DIAGNOSIS — Z87891 Personal history of nicotine dependence: Secondary | ICD-10-CM

## 2011-06-04 DIAGNOSIS — Z23 Encounter for immunization: Secondary | ICD-10-CM

## 2011-06-04 DIAGNOSIS — I70219 Atherosclerosis of native arteries of extremities with intermittent claudication, unspecified extremity: Principal | ICD-10-CM | POA: Diagnosis present

## 2011-06-04 DIAGNOSIS — I959 Hypotension, unspecified: Secondary | ICD-10-CM | POA: Diagnosis not present

## 2011-06-04 DIAGNOSIS — F17200 Nicotine dependence, unspecified, uncomplicated: Secondary | ICD-10-CM | POA: Diagnosis present

## 2011-06-04 DIAGNOSIS — Z7982 Long term (current) use of aspirin: Secondary | ICD-10-CM

## 2011-06-04 DIAGNOSIS — I739 Peripheral vascular disease, unspecified: Secondary | ICD-10-CM | POA: Diagnosis present

## 2011-06-04 DIAGNOSIS — I2510 Atherosclerotic heart disease of native coronary artery without angina pectoris: Secondary | ICD-10-CM | POA: Diagnosis present

## 2011-06-04 DIAGNOSIS — I7092 Chronic total occlusion of artery of the extremities: Secondary | ICD-10-CM | POA: Diagnosis present

## 2011-06-04 HISTORY — DX: Acute myocardial infarction, unspecified: I21.9

## 2011-06-04 HISTORY — DX: Essential (primary) hypertension: I10

## 2011-06-04 HISTORY — DX: Atherosclerotic heart disease of native coronary artery without angina pectoris: I25.10

## 2011-06-04 HISTORY — PX: ATHERECTOMY: SHX5502

## 2011-06-04 HISTORY — PX: OTHER SURGICAL HISTORY: SHX169

## 2011-06-04 HISTORY — DX: Peripheral vascular disease, unspecified: I73.9

## 2011-06-04 LAB — POCT ACTIVATED CLOTTING TIME: Activated Clotting Time: >1000 seconds

## 2011-06-04 LAB — HEMOGLOBIN AND HEMATOCRIT, BLOOD
HCT: 35.5 % — ABNORMAL LOW (ref 36.0–46.0)
Hemoglobin: 13 g/dL (ref 12.0–15.0)

## 2011-06-04 LAB — CBC
MCHC: 35.8 g/dL (ref 30.0–36.0)
Platelets: 188 10*3/uL (ref 150–400)
RDW: 13.3 % (ref 11.5–15.5)

## 2011-06-04 SURGERY — ATHERECTOMY
Anesthesia: LOCAL

## 2011-06-04 MED ORDER — FENTANYL CITRATE 0.05 MG/ML IJ SOLN
INTRAMUSCULAR | Status: AC
  Filled 2011-06-04: qty 2

## 2011-06-04 MED ORDER — SODIUM CHLORIDE 0.9 % IV SOLN: INTRAVENOUS | Status: DC

## 2011-06-04 MED ORDER — AMLODIPINE BESYLATE 5 MG PO TABS
5.0000 mg | ORAL_TABLET | Freq: Every day | ORAL | Status: DC
  Administered 2011-06-06: 5 mg via ORAL
  Filled 2011-06-04 (×2): qty 1

## 2011-06-04 MED ORDER — NIACIN ER (ANTIHYPERLIPIDEMIC) 500 MG PO TBCR
1500.0000 mg | EXTENDED_RELEASE_TABLET | Freq: Every day | ORAL | Status: DC
  Administered 2011-06-04 – 2011-06-05 (×2): 1500 mg via ORAL
  Filled 2011-06-04 (×3): qty 3

## 2011-06-04 MED ORDER — SODIUM CHLORIDE 0.9 % IV SOLN: INTRAVENOUS | Status: AC

## 2011-06-04 MED ORDER — HEPARIN (PORCINE) IN NACL 2-0.9 UNIT/ML-% IJ SOLN
INTRAMUSCULAR | Status: AC
  Filled 2011-06-04: qty 1000

## 2011-06-04 MED ORDER — ALPRAZOLAM 0.25 MG PO TABS: 0.2500 mg | ORAL_TABLET | Freq: Three times a day (TID) | ORAL | Status: DC | PRN

## 2011-06-04 MED ORDER — LIDOCAINE HCL (PF) 1 % IJ SOLN
INTRAMUSCULAR | Status: AC
  Filled 2011-06-04: qty 30

## 2011-06-04 MED ORDER — ACETAMINOPHEN 325 MG PO TABS
ORAL_TABLET | ORAL | Status: AC
  Filled 2011-06-04: qty 2

## 2011-06-04 MED ORDER — SODIUM CHLORIDE 0.9 % IJ SOLN: 3.0000 mL | INTRAMUSCULAR | Status: DC | PRN

## 2011-06-04 MED ORDER — DOPAMINE-DEXTROSE 3.2-5 MG/ML-% IV SOLN
2.0000 ug/kg/min | INTRAVENOUS | Status: DC
  Administered 2011-06-05: 5 ug/kg/min via INTRAVENOUS
  Filled 2011-06-04: qty 250

## 2011-06-04 MED ORDER — PRASUGREL HCL 10 MG PO TABS
10.0000 mg | ORAL_TABLET | Freq: Every day | ORAL | Status: DC
  Administered 2011-06-05 – 2011-06-06 (×2): 10 mg via ORAL
  Filled 2011-06-04 (×2): qty 1

## 2011-06-04 MED ORDER — ONDANSETRON HCL 4 MG/2ML IJ SOLN
4.0000 mg | Freq: Four times a day (QID) | INTRAMUSCULAR | Status: DC | PRN
  Administered 2011-06-04: 4 mg via INTRAVENOUS

## 2011-06-04 MED ORDER — LOSARTAN POTASSIUM 50 MG PO TABS
100.0000 mg | ORAL_TABLET | Freq: Every day | ORAL | Status: DC
  Administered 2011-06-06: 100 mg via ORAL
  Filled 2011-06-04 (×2): qty 2

## 2011-06-04 MED ORDER — MORPHINE SULFATE 4 MG/ML IJ SOLN: 1.0000 mg | INTRAMUSCULAR | Status: DC | PRN

## 2011-06-04 MED ORDER — DOPAMINE-DEXTROSE 3.2-5 MG/ML-% IV SOLN
INTRAVENOUS | Status: AC
  Filled 2011-06-04: qty 250

## 2011-06-04 MED ORDER — MIDAZOLAM HCL 2 MG/2ML IJ SOLN
INTRAMUSCULAR | Status: AC
  Filled 2011-06-04: qty 2

## 2011-06-04 MED ORDER — SODIUM CHLORIDE 0.9 % IV SOLN
INTRAVENOUS | Status: DC
  Administered 2011-06-04: 1000 mL via INTRAVENOUS

## 2011-06-04 MED ORDER — ACETAMINOPHEN 325 MG PO TABS
650.0000 mg | ORAL_TABLET | ORAL | Status: DC | PRN
  Administered 2011-06-04: 650 mg via ORAL

## 2011-06-04 MED ORDER — ATROPINE SULFATE 1 MG/ML IJ SOLN
INTRAMUSCULAR | Status: AC
  Filled 2011-06-04: qty 1

## 2011-06-04 MED ORDER — METOPROLOL TARTRATE 50 MG PO TABS
50.0000 mg | ORAL_TABLET | Freq: Two times a day (BID) | ORAL | Status: DC
  Administered 2011-06-06: 50 mg via ORAL
  Filled 2011-06-04 (×5): qty 1

## 2011-06-04 MED ORDER — PANTOPRAZOLE SODIUM 40 MG PO TBEC
40.0000 mg | DELAYED_RELEASE_TABLET | Freq: Every day | ORAL | Status: DC
  Administered 2011-06-05 – 2011-06-06 (×2): 40 mg via ORAL
  Filled 2011-06-04 (×2): qty 1

## 2011-06-04 MED ORDER — GLIMEPIRIDE 4 MG PO TABS
4.0000 mg | ORAL_TABLET | Freq: Every day | ORAL | Status: DC
  Administered 2011-06-05 – 2011-06-06 (×2): 4 mg via ORAL
  Filled 2011-06-04 (×3): qty 1

## 2011-06-04 MED ORDER — PNEUMOCOCCAL VAC POLYVALENT 25 MCG/0.5ML IJ INJ
0.5000 mL | INJECTION | INTRAMUSCULAR | Status: AC
  Filled 2011-06-04: qty 0.5

## 2011-06-04 MED ORDER — ZOLPIDEM TARTRATE 10 MG PO TABS: 10.0000 mg | ORAL_TABLET | Freq: Every evening | ORAL | Status: DC | PRN

## 2011-06-04 MED ORDER — ISOSORBIDE MONONITRATE ER 60 MG PO TB24
90.0000 mg | ORAL_TABLET | Freq: Every day | ORAL | Status: DC
  Administered 2011-06-06: 90 mg via ORAL
  Filled 2011-06-04 (×2): qty 1

## 2011-06-04 MED ORDER — ATORVASTATIN CALCIUM 80 MG PO TABS
80.0000 mg | ORAL_TABLET | Freq: Every day | ORAL | Status: DC
  Administered 2011-06-05 – 2011-06-06 (×2): 80 mg via ORAL
  Filled 2011-06-04 (×2): qty 1

## 2011-06-04 MED ORDER — DIAZEPAM 5 MG PO TABS
5.0000 mg | ORAL_TABLET | ORAL | Status: AC
  Administered 2011-06-04: 5 mg via ORAL
  Filled 2011-06-04: qty 1

## 2011-06-04 MED ORDER — TRAMADOL HCL 50 MG PO TABS
50.0000 mg | ORAL_TABLET | Freq: Four times a day (QID) | ORAL | Status: DC | PRN
  Filled 2011-06-04: qty 1

## 2011-06-04 NOTE — Op Note (Signed)
SHAKIRA LOS is a 61 y.o. female    [id number] LOCATION:  FACILITY: [facility]  PHYSICIAN: Nanetta Batty, M.D. December 15, 1950   DATE OF PROCEDURE:  06/04/2011  DATE OF DISCHARGE:  [facility]  PV Angio    History obtained from chart review. Ms. Kayes is a 61 year old married Caucasian female mother one child patient of Dr. Lorelee Market. She has a history of CAD and PAD status post circumflex stenting in the past. She's had stenting of her right common and external iliac artery as well as diamondback atherectomy, PTA and stenting of her right SFA. She has a known occluded left SFA. She said redilatation of her right SFA for "in-stent restenosis. She presents now for re\re angiography and potential percutaneous intervention for recurrent stenosis and lifestyle limiting claudication.  PROCEDURE DESCRIPTION:    The patient was brought to the second floor  [place] [facility] cath lab in the postabsorptive state. She was  premedicated with Valium 5 mg by mouth, IV Versed and fentanyl. Her left groin was prepped and shaved in usual sterile fashion. Xylocaine 1% was used  for local anesthesia. A 5 French sheath was inserted into the left common femoral  artery using standard Seldinger technique. The patient received  5000 units of heparin intravenously. A 5 [place] tennis racket catheter was used for abdominal aortography with bifemoral runoff using bolus chase digital subtraction step table technique. Visipaque dye was used for the entirety of the case. Retrograde aortic pressure was monitored during the case.    HEMODYNAMICS:    AO SYSTOLIC/AO DIASTOLIC: 160/71    ANGIOGRAPHIC RESULTS:   1: Abdominal aortogram-the distal abdominal aorta with widely patent. The iliac medication was free of significant disease.  2: Left lower extremity-60% calcified hypodense ostial left external iliac artery stenosis with a 23 mm pullback gradient using a 5 French endhole catheter after  administration of 200 mcg of intra-arterial nitroglycerin glycerin. There was severe diffuse disease in the left common femoral artery extending into the profunda femoris. The left SFA was occluded.there was two-vessel runoff.  3: Right lower extremity-the right common and external iliac artery stents were widely patent. The right SFA stent was occluded with reconstitution in the below below the knee popliteal bypass on the femoris collaterals with three-vessel runoff.    IMPRESSION:Ms. Clutter has bilateral total SFAs probably not percutaneously addressable. I believe she is a good candidate for femoropopliteal bypass grafting .I have reviewed the entrance with Dr. Mariam Dollar problem, VVS, who agrees with the above approach.the patient will be discharged home today as an outpatient after being gently hydrated a remaining recumbent for 4 hours. She'll see me back in the office in one to 2 weeks for followup and discussion of her options. She left the Cath Lab in stable condition.  Runell Gess MD, [facility] 06/04/2011 12:21 PM

## 2011-06-04 NOTE — H&P (Signed)
  H & P will be scanned in.  Pt was reexamined and existing H & P reviewed. No changes found.  Runell Gess, MD [facility] 06/04/2011 11:31 AM

## 2011-06-05 ENCOUNTER — Encounter (HOSPITAL_COMMUNITY): Payer: Self-pay

## 2011-06-05 DIAGNOSIS — Z87891 Personal history of nicotine dependence: Secondary | ICD-10-CM

## 2011-06-05 DIAGNOSIS — I2510 Atherosclerotic heart disease of native coronary artery without angina pectoris: Secondary | ICD-10-CM | POA: Diagnosis present

## 2011-06-05 DIAGNOSIS — I739 Peripheral vascular disease, unspecified: Secondary | ICD-10-CM | POA: Diagnosis present

## 2011-06-05 DIAGNOSIS — I959 Hypotension, unspecified: Secondary | ICD-10-CM | POA: Diagnosis not present

## 2011-06-05 LAB — BASIC METABOLIC PANEL
BUN: 11 mg/dL (ref 6–23)
CO2: 24 mEq/L (ref 19–32)
Calcium: 9.6 mg/dL (ref 8.4–10.5)
Chloride: 102 mEq/L (ref 96–112)
Creatinine, Ser: 0.6 mg/dL (ref 0.50–1.10)
GFR calc Af Amer: >90 mL/min (ref >90)
GFR calc non Af Amer: >90 mL/min (ref >90)
Glucose, Bld: 313 mg/dL — ABNORMAL HIGH (ref 70–99)
Potassium: 4.6 mEq/L (ref 3.5–5.1)
Sodium: 136 mEq/L (ref 135–145)

## 2011-06-05 LAB — CBC
HCT: 33.8 % — ABNORMAL LOW (ref 36.0–46.0)
Hemoglobin: 12.2 g/dL (ref 12.0–15.0)
MCH: 31 pg (ref 26.0–34.0)
MCHC: 36.1 g/dL — ABNORMAL HIGH (ref 30.0–36.0)
MCV: 85.8 fL (ref 78.0–100.0)
Platelets: 204 10*3/uL (ref 150–400)
RBC: 3.94 MIL/uL (ref 3.87–5.11)
RDW: 13 % (ref 11.5–15.5)
WBC: 9.8 10*3/uL (ref 4.0–10.5)

## 2011-06-05 LAB — POCT ACTIVATED CLOTTING TIME
Activated Clotting Time: 259 seconds
Activated Clotting Time: 177 seconds

## 2011-06-05 MED ORDER — SODIUM CHLORIDE 0.9 % IV BOLUS (SEPSIS)
500.0000 mL | Freq: Once | INTRAVENOUS | Status: AC
  Administered 2011-06-05: 500 mL via INTRAVENOUS

## 2011-06-05 MED ORDER — SODIUM CHLORIDE 0.9 % IV SOLN: INTRAVENOUS | Status: AC

## 2011-06-05 NOTE — Progress Notes (Signed)
[facility]  Subjective: Slept well.  Groin sore but getting better.  Objective: Vital signs in last 24 hours: Temp:  [96.9 F (36.1 C)-98.5 F (36.9 C)] 98.4 F (36.9 C) (05/08 0345) Pulse Rate:  [69-112] 86  (05/08 0700) Resp:  [15-24] 20  (05/08 0700) BP: (77-129)/(43-65) 113/59 mmHg (05/08 0700) SpO2:  [93 %-99 %] 99 % (05/08 0700) Weight:  [83.915 kg (185 lb)] 83.915 kg (185 lb) (05/07 0948)    Intake/Output from previous day: 05/07 0701 - 05/08 0700 In: 1235.3 [P.O.:510; I.V.:725.3] Out: 2500 [Urine:2500] Intake/Output this shift:    Medications Current Facility-Administered Medications  Medication Dose Route Frequency Provider Last Rate Last Dose  . 0.9 %  sodium chloride infusion   Intravenous Continuous Runell Gess, MD      . 0.9 %  sodium chloride infusion   Intravenous Continuous Runell Gess, MD      . acetaminophen (TYLENOL) tablet 650 mg  650 mg Oral Q4H PRN Runell Gess, MD   650 mg at 06/04/11 1404  . ALPRAZolam Prudy Feeler) tablet 0.25 mg  0.25 mg Oral TID PRN Abelino Derrick, PA      . amLODipine (NORVASC) tablet 5 mg  5 mg Oral Daily Eda Paschal [place], [place]      . atorvastatin (LIPITOR) tablet 80 mg  80 mg Oral Daily Eda Paschal [place], [place]      . diazepam (VALIUM) tablet 5 mg  5 mg Oral On Call Runell Gess, MD   5 mg at 06/04/11 1057  . DOPamine (INTROPIN) 800 mg in dextrose 5 % 250 mL infusion  2-20 mcg/kg/min Intravenous Titrated Runell Gess, MD 7.9 mL/hr at 06/05/11 0600 5 mcg/kg/min at 06/05/11 0600  . fentaNYL (SUBLIMAZE) 0.05 MG/ML injection           . glimepiride (AMARYL) tablet 4 mg  4 mg Oral QAC breakfast Eda Paschal [place], [place]      . heparin 2-0.9 UNIT/ML-% infusion           . heparin 2-0.9 UNIT/ML-% infusion           . isosorbide mononitrate (IMDUR) 24 hr tablet 90 mg  90 mg Oral Daily Luke K [place], [place]      . lidocaine (XYLOCAINE) 1 % injection           . lidocaine (XYLOCAINE) 1 % injection           .  losartan (COZAAR) tablet 100 mg  100 mg Oral Daily Eda Paschal [place], [place]      . metoprolol (LOPRESSOR) tablet 50 mg  50 mg Oral BID Eda Paschal [place], [place]      . midazolam (VERSED) 2 MG/2ML injection           . morphine 4 MG/ML injection 1 mg  1 mg Intravenous Q1H PRN Runell Gess, MD      . niacin (NIASPAN) CR tablet 1,500 mg  1,500 mg Oral QHS Eda Paschal [place], [place]   1,500 mg at 06/04/11 2224  . ondansetron (ZOFRAN) injection 4 mg  4 mg Intravenous Q6H PRN Runell Gess, MD   4 mg at 06/04/11 1637  . pantoprazole (PROTONIX) EC tablet 40 mg  40 mg Oral Daily Eda Paschal [place], [place]      . pneumococcal 23 valent vaccine (PNU-IMMUNE) injection 0.5 mL  0.5 mL Intramuscular Tomorrow-1000 Runell Gess, MD      . prasugrel (EFFIENT) tablet 10  mg  10 mg Oral Daily Eda Paschal [place], [place]      . traMADol Janean Sark) tablet 50 mg  50 mg Oral Q6H PRN Abelino Derrick, [place]      . zolpidem (AMBIEN) tablet 10 mg  10 mg Oral QHS PRN Abelino Derrick, [place]      . DISCONTD: 0.9 %  sodium chloride infusion   Intravenous Continuous Runell Gess, MD 75 mL/hr at 06/04/11 1058 1,000 mL at 06/04/11 1058  . DISCONTD: sodium chloride 0.9 % injection 3 mL  3 mL Intravenous PRN Runell Gess, MD        PE: General appearance: alert, cooperative and no distress Lungs: clear to auscultation bilaterally Heart: regular rate and rhythm, S1, S2 normal, no murmur, click, rub or gallop Extremities: No LEE Pulses: Radials 2+ and symmetric.  No palpable DPs.  Right foot tender. Left groin:  +ecchymosis.  Small hematoma. No Bruit  Lab Results:   Basename 06/04/11 2114 06/04/11 1728  WBC -- 15.6*  HGB 13.0 12.6  HCT 35.5* 35.2*  PLT -- 188    Studies/Results: PROCEDURE DESCRIPTION:  The patient was brought to the second floor  [place] [facility] cath lab in the postabsorptive state. She was  premedicated with Valium 5 mg by mouth, IV Versed and fentanyl. Her left groin  was prepped and shaved in usual sterile fashion. Xylocaine 1%  was used  for local anesthesia. A 5 French sheath was inserted into the left common femoral  artery using standard Seldinger technique. The patient received  5000 units of heparin intravenously. A 5 [place] tennis racket catheter was used for abdominal aortography with bifemoral runoff using bolus chase digital subtraction step table technique. Visipaque dye was used for the entirety of the case. Retrograde aortic pressure was monitored during the case.  HEMODYNAMICS:  AO SYSTOLIC/AO DIASTOLIC: 160/71  ANGIOGRAPHIC RESULTS:  1: Abdominal aortogram-the distal abdominal aorta with widely patent. The iliac medication was free of significant disease.  2: Left lower extremity-60% calcified hypodense ostial left external iliac artery stenosis with a 23 mm pullback gradient using a 5 French endhole catheter after administration of 200 mcg of intra-arterial nitroglycerin glycerin. There was severe diffuse disease in the left common femoral artery extending into the profunda femoris. The left SFA was occluded.there was two-vessel runoff.  3: Right lower extremity-the right common and external iliac artery stents were widely patent. The right SFA stent was occluded with reconstitution in the below below the knee popliteal bypass on the femoris collaterals with three-vessel runoff.  IMPRESSION:Ms. Stief has bilateral total SFAs probably not percutaneously addressable. I believe she is a good candidate for femoropopliteal bypass grafting .I have reviewed the entrance with Dr. Mariam Dollar problem, VVS, who agrees with the above approach.the patient will be discharged home today as an outpatient after being gently hydrated a remaining recumbent for 4 hours. She'll see me back in the office in one to 2 weeks for followup and discussion of her options. She left the Cath Lab in stable condition.  Runell Gess MD, [facility]  06/04/2011  12:21 PM    Assessment/Plan  Active Problems: Patient Active Hospital Problem  List:  PAD (peripheral artery disease) (06/05/2011) Hypotension (06/05/2011) CAD (coronary artery disease): DES to dominant Circ 10/13/09 (06/05/2011) Claudication in peripheral vascular disease:  Lifestyle limiting. (06/05/2011) Hx of tobacco use, presenting hazards to health (06/05/2011)   Plan:  S/P PV Angiogram 06/04/11.   The pt has bilateral total SFAs probably not percutaneously addressable.  She likely is a good candidate for femoropopliteal bypass grafting.  During sheath removal yesterday the patient became hypotensive.  She apparently received NS bolus then was started on dopamine.  It is slowly being weaned down.  BP Range 77/43 - 129/57.  Last 86/58.  Will give another bolus.  Dopamine is at and trying to wean off.  H&H stable.       LOS: 1 day    HAGER,BRYAN W 06/05/2011 7:44 AM  I have seen and examined the patient along with Dwana Melena, PA.  I have reviewed the chart, notes and new data.  I agree with PA's note.  Key new complaints: a little sore in left groin Key examination changes: small ecchymosis, no clear large hematoma Key new findings / data: Hgb stable  PLAN: Wean off dopamine. DC home when asymptomatic and BP stable standing up.  Thurmon Fair, MD, [facility] [phone] 06/05/2011, 8:33 AM

## 2011-06-05 NOTE — Care Management Note (Signed)
    Page 1 of 1   06/05/2011     10:31:57 AM   CARE MANAGEMENT NOTE 06/05/2011  Patient:  [name] [name], [name] [name]   Account Number:  [id number]  Date Initiated:  06/05/2011  Documentation initiated by:  Junius Creamer  Subjective/Objective Assessment:   adm w claudication     Action/Plan:   lives w husband,   Anticipated DC Date:  06/05/2011   Anticipated DC Plan:  HOME/SELF CARE      DC Planning Services  CM consult      Choice offered to / List presented to:             Status of service:   Medicare Important Message given?   (If response is "NO", the following Medicare IM given date fields will be blank) Date Medicare IM given:   Date Additional Medicare IM given:    Discharge Disposition:  HOME/SELF CARE  Per UR Regulation:  Reviewed for med. necessity/level of care/duration of stay  If discussed at Long Length of Stay Meetings, dates discussed:    Comments:  5/8 debbie Maleka Contino rn,bsn

## 2011-06-05 NOTE — Progress Notes (Signed)
Pt OOB to chair tolerated moving well. Once up in the chair for 30 minutes B/P started to decrease ( see vital signs on flowsheet) pt became symptomatic states I dont feel as good as I did. Placed pt feet up in recliner

## 2011-06-06 ENCOUNTER — Encounter (HOSPITAL_COMMUNITY): Payer: Self-pay

## 2011-06-06 LAB — GLUCOSE, CAPILLARY: Glucose-Capillary: 207 mg/dL — ABNORMAL HIGH (ref 70–99)

## 2011-06-06 NOTE — Progress Notes (Addendum)
[facility]  Subjective: Slept well.  Groin sore but dizziness and hypotension have resolved. Off dopamine.  Objective: Vital signs in last 24 hours: Temp:  [98 F (36.7 C)-98.4 F (36.9 C)] 98.4 F (36.9 C) (05/09 0730) Pulse Rate:  [74-96] 86  (05/09 0600) Resp:  [17-25] 18  (05/09 0652) BP: (78-140)/(45-78) 140/58 mmHg (05/09 0652) SpO2:  [94 %-100 %] 98 % (05/09 0652)    Intake/Output from previous day: 05/08 0701 - 05/09 0700 In: 2982.3 [P.O.:1440; I.V.:1542.3] Out: 2775 [Urine:2775] Intake/Output this shift:    Medications Current Facility-Administered Medications  Medication Dose Route Frequency Provider Last Rate Last Dose  . 0.9 %  sodium chloride infusion   Intravenous Continuous Dwana Melena, PA      . acetaminophen (TYLENOL) tablet 650 mg  650 mg Oral Q4H PRN Runell Gess, MD   650 mg at 06/04/11 1404  . ALPRAZolam Prudy Feeler) tablet 0.25 mg  0.25 mg Oral TID PRN Abelino Derrick, PA      . amLODipine (NORVASC) tablet 5 mg  5 mg Oral Daily Eda Paschal [place], [place]      . atorvastatin (LIPITOR) tablet 80 mg  80 mg Oral Daily Eda Paschal [place], [place]   80 mg at 06/05/11 0906  . DOPamine (INTROPIN) 800 mg in dextrose 5 % 250 mL infusion  2-20 mcg/kg/min Intravenous Titrated Runell Gess, MD 1.6 mL/hr at 06/05/11 2000 1 mcg/kg/min at 06/05/11 2000  . glimepiride (AMARYL) tablet 4 mg  4 mg Oral QAC breakfast Eda Paschal [place], [place]   4 mg at 06/05/11 1324  . isosorbide mononitrate (IMDUR) 24 hr tablet 90 mg  90 mg Oral Daily Luke K [place], [place]      . losartan (COZAAR) tablet 100 mg  100 mg Oral Daily Eda Paschal [place], [place]      . metoprolol (LOPRESSOR) tablet 50 mg  50 mg Oral BID Eda Paschal [place], [place]      . morphine 4 MG/ML injection 1 mg  1 mg Intravenous Q1H PRN Runell Gess, MD      . niacin (NIASPAN) CR tablet 1,500 mg  1,500 mg Oral QHS Eda Paschal [place], [place]   1,500 mg at 06/05/11 2127  . ondansetron (ZOFRAN) injection 4 mg  4 mg Intravenous Q6H PRN Runell Gess, MD   4 mg at 06/04/11 1637  . pantoprazole (PROTONIX) EC tablet 40 mg  40 mg Oral Daily Eda Paschal [place], [place]   40 mg at 06/05/11 0908  . pneumococcal 23 valent vaccine (PNU-IMMUNE) injection 0.5 mL  0.5 mL Intramuscular Tomorrow-1000 Runell Gess, MD      . prasugrel (EFFIENT) tablet 10 mg  10 mg Oral Daily Eda Paschal [place], [place]   10 mg at 06/05/11 0906  . sodium chloride 0.9 % bolus 500 mL  500 mL Intravenous Once Dwana Melena, PA   500 mL at 06/05/11 0901  . traMADol (ULTRAM) tablet 50 mg  50 mg Oral Q6H PRN Abelino Derrick, PA      . zolpidem (AMBIEN) tablet 10 mg  10 mg Oral QHS PRN Abelino Derrick, PA      . DISCONTD: 0.9 %  sodium chloride infusion   Intravenous Continuous Runell Gess, MD        PE: General appearance: alert, cooperative and no distress Lungs: clear to auscultation bilaterally Heart: regular rate and rhythm, S1, S2 normal, no murmur, click, rub or gallop Extremities:  No LEE Pulses: Radials 2+ and symmetric.  No palpable DPs.  Right foot tender. Left groin:  +ecchymosis.  Small hematoma. No Bruit  Lab Results:   Basename 06/05/11 1608 06/05/11 1610 06/04/11 2114 06/04/11 1728  WBC -- 9.8 -- 15.6*  HGB 12.2 12.2 13.0 --  HCT 34.2* 33.8* 35.5* --  PLT -- 204 -- 188      Assessment/Plan  Active Problems: Patient Active Hospital Problem List:  PAD (peripheral artery disease) (06/05/2011) Hypotension (06/05/2011) CAD (coronary artery disease): DES to dominant Circ 10/13/09 (06/05/2011) Claudication in peripheral vascular disease:  Lifestyle limiting. (06/05/2011) Hx of tobacco use, presenting hazards to health (06/05/2011)   Cause of hypotension is unclear. No overt severe blood loss and Hgb stable. Possible delayed anaphylactoid contrast reaction.  If she can walk around without dizziness or drop in BP she will go home later today.  Restart metoprolol only at discharge. Reintroduce losartan, nitrates and amlodipine gradually as outpatient.  LOS: 2 days

## 2011-06-09 NOTE — Discharge Summary (Signed)
Physician Discharge Summary  Patient ID: [name] [name] MRN: [id number] DOB/AGE: 1950-02-01 61 y.o.  Admit date: 06/04/2011 Discharge date: 06/09/2011  Admission Diagnoses:  Discharge Diagnoses:  Principal Problem:  *PAD (peripheral artery disease) Active Problems:  Hypotension  CAD (coronary artery disease): DES to dominant Circ 10/13/09  Claudication in peripheral vascular disease:  Lifestyle limiting.  Hx of tobacco use, presenting hazards to health   Discharged Condition: stable  Hospital Course:   [name] [name] is a 61 year old married Caucasian female mother one child patient of Dr. Lorelee Market. She has a history of CAD and PAD status post circumflex stenting in the past. She's had stenting of her right common and external iliac artery as well as diamondback atherectomy, PTA and stenting of her right SFA. She has a known occluded left SFA. She said redilatation of her right SFA for "in-stent restenosis. She presented for re angiography and potential percutaneous intervention for recurrent stenosis and lifestyle limiting claudication.  [name] [name] has bilateral total SFAs probably not percutaneously addressable. I believe she is a good candidate for femoropopliteal bypass grafting .  The results were reviewed with Dr. Mariam Dollar problem, VVS, who agrees with the above approach.  During sheath removal the patient became hypotensive. Possible delayed anaphylactoid contrast reaction.  She apparently received NS bolus then was started on dopamine. It was slowly weaned off.  Restarted metoprolol  at discharge. Reintroduce losartan, nitrates and amlodipine gradually as outpatient.  THe patient was discharge home in stable condition after being seen by Dr. Royann Shivers.  Follow up with Dr, Allyson Sabal.  Consults: None  Significant Diagnostic Studies:  PROCEDURE DESCRIPTION:  The patient was brought to the second floor  [place] [facility] cath lab in the postabsorptive state. She was  premedicated with Valium 5 mg  by mouth, IV Versed and fentanyl. Her left groin  was prepped and shaved in usual sterile fashion. Xylocaine 1% was used  for local anesthesia. A 5 French sheath was inserted into the left common femoral  artery using standard Seldinger technique. The patient received  5000 units of heparin intravenously. A 5 [place] tennis racket catheter was used for abdominal aortography with bifemoral runoff using bolus chase digital subtraction step table technique. Visipaque dye was used for the entirety of the case. Retrograde aortic pressure was monitored during the case.  HEMODYNAMICS:  AO SYSTOLIC/AO DIASTOLIC: 160/71  ANGIOGRAPHIC RESULTS:  1: Abdominal aortogram-the distal abdominal aorta with widely patent. The iliac medication was free of significant disease.  2: Left lower extremity-60% calcified hypodense ostial left external iliac artery stenosis with a 23 mm pullback gradient using a 5 French endhole catheter after administration of 200 mcg of intra-arterial nitroglycerin glycerin. There was severe diffuse disease in the left common femoral artery extending into the profunda femoris. The left SFA was occluded.there was two-vessel runoff.  3: Right lower extremity-the right common and external iliac artery stents were widely patent. The right SFA stent was occluded with reconstitution in the below below the knee popliteal bypass on the femoris collaterals with three-vessel runoff.  IMPRESSION:[name] [name] has bilateral total SFAs probably not percutaneously addressable. I believe she is a good candidate for femoropopliteal bypass grafting .I have reviewed the entrance with Dr. Mariam Dollar problem, VVS, who agrees with the above approach.the patient will be discharged home today as an outpatient after being gently hydrated a remaining recumbent for 4 hours. She'll see me back in the office in one to 2 weeks for followup and discussion of her options. She left  the Cath Lab in stable condition.  Runell Gess MD,  [facility]  06/04/2011  12:21 PM   Treatments: Dopamine  Discharge Exam: Blood pressure 125/47, pulse 85, temperature 98.6 F (37 C), temperature source Oral, resp. rate 19, height 5\' 4"  (1.626 m), weight 83.915 kg (185 lb), SpO2 98.00%.   Disposition: 01-Home or Self Care  Discharge Orders    Future Orders Please Complete By Expires   Diet - low sodium heart healthy      Increase activity slowly      Discharge instructions      Comments:   No Driving or lifting more than a gallon of milk for two days.     Medication List  As of 06/09/2011 10:51 AM   STOP taking these medications         metoprolol 50 MG tablet         TAKE these medications         amLODipine 5 MG tablet   Commonly known as: NORVASC   Take 5 mg by mouth daily.      aspirin EC 325 MG tablet   Take 325 mg by mouth at bedtime. Prior to Niaspan      atorvastatin 80 MG tablet   Commonly known as: LIPITOR   Take 80 mg by mouth daily.      glimepiride 4 MG tablet   Commonly known as: AMARYL   Take 4 mg by mouth daily before breakfast.      isosorbide mononitrate 60 MG 24 hr tablet   Commonly known as: IMDUR   Take 90 mg by mouth daily.      losartan 100 MG tablet   Commonly known as: COZAAR   Take 100 mg by mouth daily.      metFORMIN 1000 MG tablet   Commonly known as: GLUCOPHAGE   Take 1,000 mg by mouth 2 (two) times daily with a meal.      niacin 750 MG CR tablet   Commonly known as: NIASPAN   Take 1,500 mg by mouth at bedtime.      pantoprazole 40 MG tablet   Commonly known as: PROTONIX   Take 40 mg by mouth daily.      prasugrel 10 MG Tabs   Commonly known as: EFFIENT   Take 10 mg by mouth daily.           Follow-up Information    Follow up with Runell Gess, MD on 06/17/2011. (    3:45 PM)    Contact information:   [address] [place] [address] [phone]          Signed: Dwana Melena 06/09/2011, 10:51 AM

## 2011-09-18 ENCOUNTER — Other Ambulatory Visit: Payer: Self-pay | Admitting: Internal Medicine

## 2011-09-18 DIAGNOSIS — Z1231 Encounter for screening mammogram for malignant neoplasm of breast: Secondary | ICD-10-CM

## 2011-09-26 ENCOUNTER — Ambulatory Visit: Payer: 59

## 2012-06-10 ENCOUNTER — Other Ambulatory Visit: Payer: Self-pay | Admitting: *Deleted

## 2012-06-10 MED ORDER — AMLODIPINE BESYLATE 5 MG PO TABS: 5.0000 mg | ORAL_TABLET | Freq: Every day | ORAL | Status: DC

## 2012-06-10 NOTE — Telephone Encounter (Signed)
Refill sent to pharmacy.   

## 2012-08-04 ENCOUNTER — Other Ambulatory Visit: Payer: Self-pay

## 2012-08-04 MED ORDER — NIACIN ER (ANTIHYPERLIPIDEMIC) 750 MG PO TBCR: 1500.0000 mg | EXTENDED_RELEASE_TABLET | Freq: Every day | ORAL | Status: DC

## 2012-08-04 NOTE — Telephone Encounter (Signed)
Rx was sent to pharmacy electronically. 

## 2013-02-17 ENCOUNTER — Other Ambulatory Visit (HOSPITAL_COMMUNITY): Payer: Self-pay | Admitting: Cardiovascular Disease

## 2013-02-17 DIAGNOSIS — I739 Peripheral vascular disease, unspecified: Secondary | ICD-10-CM

## 2013-02-23 ENCOUNTER — Encounter (HOSPITAL_COMMUNITY): Payer: 59

## 2013-03-02 ENCOUNTER — Emergency Department (HOSPITAL_COMMUNITY): Payer: 59

## 2013-03-02 ENCOUNTER — Encounter (HOSPITAL_COMMUNITY): Payer: Self-pay | Admitting: Emergency Medicine

## 2013-03-02 ENCOUNTER — Inpatient Hospital Stay (HOSPITAL_COMMUNITY): Payer: 59

## 2013-03-02 ENCOUNTER — Observation Stay (HOSPITAL_COMMUNITY)
Admission: EM | Admit: 2013-03-02 | Discharge: 2013-03-04 | Disposition: A | Discharge status: Home / Self Care | Payer: 59 | Attending: Internal Medicine | Admitting: Internal Medicine

## 2013-03-02 DIAGNOSIS — R29818 Other symptoms and signs involving the nervous system: Secondary | ICD-10-CM

## 2013-03-02 DIAGNOSIS — R519 Headache, unspecified: Secondary | ICD-10-CM

## 2013-03-02 DIAGNOSIS — E785 Hyperlipidemia, unspecified: Secondary | ICD-10-CM | POA: Insufficient documentation

## 2013-03-02 DIAGNOSIS — Z87891 Personal history of nicotine dependence: Secondary | ICD-10-CM | POA: Insufficient documentation

## 2013-03-02 DIAGNOSIS — I10 Essential (primary) hypertension: Secondary | ICD-10-CM | POA: Insufficient documentation

## 2013-03-02 DIAGNOSIS — Z7902 Long term (current) use of antithrombotics/antiplatelets: Secondary | ICD-10-CM | POA: Insufficient documentation

## 2013-03-02 DIAGNOSIS — R2990 Unspecified symptoms and signs involving the nervous system: Secondary | ICD-10-CM | POA: Diagnosis present

## 2013-03-02 DIAGNOSIS — G43109 Migraine with aura, not intractable, without status migrainosus: Principal | ICD-10-CM

## 2013-03-02 DIAGNOSIS — Z8673 Personal history of transient ischemic attack (TIA), and cerebral infarction without residual deficits: Secondary | ICD-10-CM | POA: Insufficient documentation

## 2013-03-02 DIAGNOSIS — IMO0002 Reserved for concepts with insufficient information to code with codable children: Secondary | ICD-10-CM

## 2013-03-02 DIAGNOSIS — Z794 Long term (current) use of insulin: Secondary | ICD-10-CM | POA: Insufficient documentation

## 2013-03-02 DIAGNOSIS — E1165 Type 2 diabetes mellitus with hyperglycemia: Secondary | ICD-10-CM

## 2013-03-02 DIAGNOSIS — IMO0001 Reserved for inherently not codable concepts without codable children: Secondary | ICD-10-CM | POA: Insufficient documentation

## 2013-03-02 DIAGNOSIS — E119 Type 2 diabetes mellitus without complications: Secondary | ICD-10-CM | POA: Diagnosis present

## 2013-03-02 DIAGNOSIS — I2510 Atherosclerotic heart disease of native coronary artery without angina pectoris: Secondary | ICD-10-CM | POA: Diagnosis present

## 2013-03-02 DIAGNOSIS — I739 Peripheral vascular disease, unspecified: Secondary | ICD-10-CM

## 2013-03-02 DIAGNOSIS — R51 Headache: Secondary | ICD-10-CM

## 2013-03-02 DIAGNOSIS — I6529 Occlusion and stenosis of unspecified carotid artery: Secondary | ICD-10-CM | POA: Insufficient documentation

## 2013-03-02 DIAGNOSIS — R4701 Aphasia: Secondary | ICD-10-CM

## 2013-03-02 DIAGNOSIS — I252 Old myocardial infarction: Secondary | ICD-10-CM | POA: Insufficient documentation

## 2013-03-02 DIAGNOSIS — Z7982 Long term (current) use of aspirin: Secondary | ICD-10-CM | POA: Insufficient documentation

## 2013-03-02 HISTORY — DX: Myoneural disorder, unspecified: G70.9

## 2013-03-02 HISTORY — DX: Unspecified osteoarthritis, unspecified site: M19.90

## 2013-03-02 HISTORY — DX: Adverse effect of unspecified anesthetic, initial encounter: T41.45XA

## 2013-03-02 HISTORY — DX: Other complications of anesthesia, initial encounter: T88.59XA

## 2013-03-02 LAB — PROTIME-INR
INR: 1.02 (ref 0.00–1.49)
Prothrombin Time: 13.2 seconds (ref 11.6–15.2)

## 2013-03-02 LAB — CBC
HEMATOCRIT: 41.6 % (ref 36.0–46.0)
Hemoglobin: 15.4 g/dL — ABNORMAL HIGH (ref 12.0–15.0)
MCH: 31.6 pg (ref 26.0–34.0)
MCHC: 37 g/dL — ABNORMAL HIGH (ref 30.0–36.0)
MCV: 85.4 fL (ref 78.0–100.0)
Platelets: 224 10*3/uL (ref 150–400)
RBC: 4.87 MIL/uL (ref 3.87–5.11)
RDW: 13.2 % (ref 11.5–15.5)
WBC: 9.5 10*3/uL (ref 4.0–10.5)

## 2013-03-02 LAB — COMPREHENSIVE METABOLIC PANEL
ALT: 18 U/L (ref 0–35)
AST: 18 U/L (ref 0–37)
Albumin: 3.5 g/dL (ref 3.5–5.2)
Alkaline Phosphatase: 98 U/L (ref 39–117)
BUN: 15 mg/dL (ref 6–23)
CALCIUM: 9.3 mg/dL (ref 8.4–10.5)
CO2: 22 meq/L (ref 19–32)
Chloride: 93 mEq/L — ABNORMAL LOW (ref 96–112)
Creatinine, Ser: 0.57 mg/dL (ref 0.50–1.10)
GFR calc Af Amer: >90 mL/min (ref >90)
GLUCOSE: 415 mg/dL — AB (ref 70–99)
Potassium: 4.2 mEq/L (ref 3.7–5.3)
Sodium: 132 mEq/L — ABNORMAL LOW (ref 137–147)
TOTAL PROTEIN: 7.5 g/dL (ref 6.0–8.3)
Total Bilirubin: 0.5 mg/dL (ref 0.3–1.2)

## 2013-03-02 LAB — POCT I-STAT, CHEM 8
BUN: 15 mg/dL (ref 6–23)
CALCIUM ION: 1.18 mmol/L (ref 1.13–1.30)
CHLORIDE: 98 meq/L (ref 96–112)
Creatinine, Ser: 0.7 mg/dL (ref 0.50–1.10)
GLUCOSE: 408 mg/dL — AB (ref 70–99)
HEMATOCRIT: 46 % (ref 36.0–46.0)
HEMOGLOBIN: 15.6 g/dL — AB (ref 12.0–15.0)
POTASSIUM: 4 meq/L (ref 3.7–5.3)
Sodium: 134 mEq/L — ABNORMAL LOW (ref 137–147)
TCO2: 24 mmol/L (ref 0–100)

## 2013-03-02 LAB — DIFFERENTIAL
Basophils Absolute: 0 10*3/uL (ref 0.0–0.1)
Basophils Relative: 0 % (ref 0–1)
EOS ABS: 0.1 10*3/uL (ref 0.0–0.7)
EOS PCT: 1 % (ref 0–5)
LYMPHS ABS: 2.2 10*3/uL (ref 0.7–4.0)
LYMPHS PCT: 24 % (ref 12–46)
MONO ABS: 0.7 10*3/uL (ref 0.1–1.0)
MONOS PCT: 8 % (ref 3–12)
Neutro Abs: 6.4 10*3/uL (ref 1.7–7.7)
Neutrophils Relative %: 67 % (ref 43–77)

## 2013-03-02 LAB — APTT: aPTT: 25 seconds (ref 24–37)

## 2013-03-02 LAB — GLUCOSE, CAPILLARY
GLUCOSE-CAPILLARY: 380 mg/dL — AB (ref 70–99)
GLUCOSE-CAPILLARY: 279 mg/dL — AB (ref 70–99)

## 2013-03-02 LAB — POCT I-STAT TROPONIN I: Troponin i, poc: 0.01 ng/mL (ref 0.00–0.08)

## 2013-03-02 LAB — TROPONIN I

## 2013-03-02 MED ORDER — SODIUM CHLORIDE 0.9 % IV BOLUS (SEPSIS)
1000.0000 mL | Freq: Once | INTRAVENOUS | Status: AC
  Administered 2013-03-02: 1000 mL via INTRAVENOUS

## 2013-03-02 MED ORDER — KETOROLAC TROMETHAMINE 30 MG/ML IJ SOLN
30.0000 mg | Freq: Once | INTRAMUSCULAR | Status: AC
  Administered 2013-03-02: 30 mg via INTRAVENOUS
  Filled 2013-03-02: qty 1

## 2013-03-02 MED ORDER — INSULIN ASPART 100 UNIT/ML ~~LOC~~ SOLN
0.0000 [IU] | Freq: Every day | SUBCUTANEOUS | Status: DC
Start: 2013-03-02 — End: 2013-03-04
  Administered 2013-03-02: 3 [IU] via SUBCUTANEOUS
  Filled 2013-03-02: qty 1

## 2013-03-02 MED ORDER — MORPHINE SULFATE 4 MG/ML IJ SOLN
4.0000 mg | Freq: Once | INTRAMUSCULAR | Status: AC
  Administered 2013-03-02: 4 mg via INTRAVENOUS
  Filled 2013-03-02: qty 1

## 2013-03-02 MED ORDER — PROCHLORPERAZINE EDISYLATE 5 MG/ML IJ SOLN
10.0000 mg | Freq: Four times a day (QID) | INTRAMUSCULAR | Status: DC | PRN
  Administered 2013-03-02: 10 mg via INTRAVENOUS
  Filled 2013-03-02 (×2): qty 2

## 2013-03-02 NOTE — ED Notes (Signed)
Paged Dr. Cena Benton regarding change in mental status when performing neuro checks. Dr. Cena Benton stated to have floor page neurology if pt continues to have changes in mental status.

## 2013-03-02 NOTE — ED Notes (Signed)
Patient transported to X-ray 

## 2013-03-02 NOTE — Consult Note (Signed)
Referring Physician: Patria Mane    Chief Complaint: Headache and difficulty with speech  HPI: [name] [name] is an 63 y.o. female who was shopping with family today.  On the way home she began to complain of headache.  She describes the headache as being on the left.  She can not determine if it is sharp, dull or throbbing but reports that it just hurts.  It is not like any other headache she has had.  Has some associated photophobia but no phonophobia, nausea or vomiting.   Once at home began to have difficulty getting her words out.  EMS was called at that time and the patient was brought in as a code stroke.  Initial NIHSS of 2.  Date last known well: Date: 03/02/2013 Time last known well: Time: 17:30 tPA Given: No: Patient on Effient  Past Medical History  Diagnosis Date  . Peripheral vascular disease   . Diabetes mellitus   . Coronary artery disease   . Myocardial infarction   . Hypertension     Past Surgical History  Procedure Laterality Date  . Coronary angioplasty    . Abdominal hysterectomy      Family History  Problem Relation Age of Onset  . Hypertension Mother    Social History:  reports that she quit smoking about 3 years ago. Her smoking use included Cigarettes. She smoked 0.00 packs per day. She does not have any smokeless tobacco history on file. She reports that she does not drink alcohol or use illicit drugs.  Allergies:  Allergies  Allergen Reactions  . Latex Rash  . Penicillins Swelling and Rash    Medications: I have reviewed the patient's current medications. Prior to Admission:  Current outpatient prescriptions: amLODipine (NORVASC) 5 MG tablet, Take 1 tablet (5 mg total) by mouth daily., Disp: 30 tablet, Rfl: 3;   aspirin EC 325 MG tablet, Take 325 mg by mouth at bedtime. Prior to Niaspan, Disp: , Rfl: ;   atorvastatin (LIPITOR) 80 MG tablet, Take 80 mg by mouth daily., Disp: , Rfl: ;  glimepiride (AMARYL) 4 MG tablet, Take 4 mg by mouth daily before  breakfast., Disp: , Rfl:  isosorbide mononitrate (IMDUR) 60 MG 24 hr tablet, Take 90 mg by mouth daily., Disp: , Rfl: ;   losartan (COZAAR) 100 MG tablet, Take 100 mg by mouth daily., Disp: , Rfl: ;   metFORMIN (GLUCOPHAGE) 1000 MG tablet, Take 1,000 mg by mouth 2 (two) times daily with a meal., Disp: , Rfl: ;   niacin (NIASPAN) 750 MG CR tablet, Take 2 tablets (1,500 mg total) by mouth at bedtime., Disp: 60 tablet, Rfl: 1 pantoprazole (PROTONIX) 40 MG tablet, Take 40 mg by mouth daily., Disp: , Rfl: ;   prasugrel (EFFIENT) 10 MG TABS, Take 10 mg by mouth daily., Disp: , Rfl:   ROS: History obtained from family member  General ROS: negative for - chills, fatigue, fever, night sweats, weight gain or weight loss Psychological ROS: increased stress at home Ophthalmic ROS: negative for - blurry vision, double vision, eye pain or loss of vision ENT ROS: negative for - epistaxis, nasal discharge, oral lesions, sore throat, tinnitus or vertigo Allergy and Immunology ROS: negative for - hives or itchy/watery eyes Hematological and Lymphatic ROS: negative for - bleeding problems, bruising or swollen lymph nodes Endocrine ROS: negative for - galactorrhea, hair pattern changes, polydipsia/polyuria or temperature intolerance Respiratory ROS: negative for - cough, hemoptysis, shortness of breath or wheezing Cardiovascular ROS: negative for -  chest pain, dyspnea on exertion, edema or irregular heartbeat Gastrointestinal ROS: negative for - abdominal pain, diarrhea, hematemesis, nausea/vomiting or stool incontinence Genito-Urinary ROS: negative for - dysuria, hematuria, incontinence or urinary frequency/urgency Musculoskeletal ROS: negative for - joint swelling or muscular weakness Neurological ROS: as noted in HPI Dermatological ROS: negative for rash and skin lesion changes  Physical Examination: Blood pressure 115/72, pulse 106, temperature 98.2 F (36.8 C), temperature source Oral, resp. rate 18,  SpO2 97.00%.  Neurologic Examination: Mental Status: Alert, oriented, thought content appropriate.  Patient has some word finding difficulties and make some paraphasic errors.  Able to follow commands without difficulty. Cranial Nerves: II: Discs flat bilaterally; Does not appreciate my fingers in the right visual field with formal testing but seems to appreciate objects otherwise on the right. Left pupil 2mm and right pupil 3mm.  Both round, reactive to light and accommodation III,IV, VI: ptosis not present, extra-ocular motions intact bilaterally V,VII: smile symmetric, facial light touch sensation normal bilaterally VIII: hearing normal bilaterally IX,X: gag reflex present XI: bilateral shoulder shrug XII: midline tongue extension Motor: Right : Upper extremity   5/5    Left:     Upper extremity   5/5  Lower extremity   5/5     Lower extremity   5/5 Tone and bulk:normal tone throughout; no atrophy noted Sensory: Pinprick and light touch intact throughout, bilaterally Deep Tendon Reflexes: 2+ and symmetric throughout Plantars: Right: downgoing   Left: downgoing Cerebellar: normal finger-to-nose and normal heel-to-shin test Gait: Unable to test CV: pulses palpable throughout    Laboratory Studies:  Basic Metabolic Panel:  Recent Labs Lab 03/02/13 1942 03/02/13 1948  NA 132* 134*  K 4.2 4.0  CL 93* 98  CO2 22  --   GLUCOSE 415* 408*  BUN 15 15  CREATININE 0.57 0.70  CALCIUM 9.3  --     Liver Function Tests:  Recent Labs Lab 03/02/13 1942  AST 18  ALT 18  ALKPHOS 98  BILITOT 0.5  PROT 7.5  ALBUMIN 3.5   No results found for this basename: LIPASE, AMYLASE,  in the last 168 hours No results found for this basename: AMMONIA,  in the last 168 hours  CBC:  Recent Labs Lab 03/02/13 1942 03/02/13 1948  WBC 9.5  --   NEUTROABS 6.4  --   HGB 15.4* 15.6*  HCT 41.6 46.0  MCV 85.4  --   PLT 224  --     Cardiac Enzymes:  Recent Labs Lab 03/02/13 1942   TROPONINI <0.30    BNP: No components found with this basename: POCBNP,   CBG:  Recent Labs Lab 03/02/13 1959  GLUCAP 380*    Microbiology: Results for orders placed during the hospital encounter of 06/04/11  MRSA PCR SCREENING     Status: None   Collection Time    06/04/11  5:28 PM      Result Value Range Status   MRSA by PCR NEGATIVE  NEGATIVE Final   Comment:            The GeneXpert MRSA Assay (FDA     approved for NASAL specimens     only), is one component of a     comprehensive MRSA colonization     surveillance program. It is not     intended to diagnose MRSA     infection nor to guide or     monitor treatment for     MRSA infections.    Coagulation Studies:  Recent Labs  03/02/13 1942  LABPROT 13.2  INR 1.02    Urinalysis: No results found for this basename: COLORURINE, APPERANCEUR, LABSPEC, PHURINE, GLUCOSEU, HGBUR, BILIRUBINUR, KETONESUR, PROTEINUR, UROBILINOGEN, NITRITE, LEUKOCYTESUR,  in the last 168 hours  Lipid Panel:    Component Value Date/Time   CHOL  Value: 150        ATP III CLASSIFICATION:  <200     mg/dL   Desirable  621-308200-239  mg/dL   Borderline High  >=657>=240    mg/dL   High        8/46/96299/16/2011 0406   TRIG 213* 10/13/2009 0406   HDL 31* 10/13/2009 0406   CHOLHDL 4.8 10/13/2009 0406   VLDL 43* 10/13/2009 0406   LDLCALC  Value: 76        Total Cholesterol/HDL:CHD Risk Coronary Heart Disease Risk Table                     Men   Women  1/2 Average Risk   3.4   3.3  Average Risk       5.0   4.4  2 X Average Risk   9.6   7.1  3 X Average Risk  23.4   11.0        Use the calculated Patient Ratio above and the CHD Risk Table to determine the patient's CHD Risk.        ATP III CLASSIFICATION (LDL):  <100     mg/dL   Optimal  528-413100-129  mg/dL   Near or Above                    Optimal  130-159  mg/dL   Borderline  244-010160-189  mg/dL   High  >272>190     mg/dL   Very High 5/36/64409/16/2011 [id number]    HgbA1C:  Lab Results  Component Value Date   HGBA1C  Value: 8.7 (NOTE)                                                                        According to the ADA Clinical Practice Recommendations for 2011, when HbA1c is used as a screening test:   >=6.5%   Diagnostic of Diabetes Mellitus           (if abnormal result  is confirmed)  5.7-6.4%   Increased risk of developing Diabetes Mellitus  References:Diagnosis and Classification of Diabetes Mellitus,Diabetes Care,2011,34(Suppl 1):S62-S69 and Standards of Medical Care in         Diabetes - 2011,Diabetes Care,2011,34  (Suppl 1):S11-S61.* 10/13/2009    Urine Drug Screen:   No results found for this basename: labopia, cocainscrnur, labbenz, amphetmu, thcu, labbarb    Alcohol Level: No results found for this basename: ETH,  in the last 168 hours  Other results: EKG: sinus tachycardia at 109 bpm  Imaging: Ct Head (brain) Wo Contrast  03/02/2013   CLINICAL DATA:  Code stroke.  EXAM: CT HEAD WITHOUT CONTRAST  TECHNIQUE: Contiguous axial images were obtained from the base of the skull through the vertex without intravenous contrast.  COMPARISON:  No comparisons  FINDINGS: No acute intracranial abnormality. Specifically, no hemorrhage, hydrocephalus, mass lesion, acute infarction, or significant intracranial injury. No acute calvarial  abnormality. Visualized paranasal sinuses and mastoids clear. Orbital soft tissues unremarkable.  IMPRESSION: No acute intracranial abnormality.  Critical Value/emergent results were called by telephone at the time of interpretation on 03/02/2013 at 7:54 PM to Dr. Thad Ranger, who verbally acknowledged these results.   Electronically Signed   By: Charlett Nose M.D.   On: 03/02/2013 19:56    Assessment: 63 y.o. female presenting with headache and difficulty with speech.  Patient on Effient.  Unclear compliance with medications but it seems that she at least has had her anticoagulant within the past 24 hours.  She is therefore not a tPA candidate.  Head CT reviewed and shows no acute changes.  Although acute  infarct on the differential can not rule out a complicated migraine as well.  Further work up recommended.    Stroke Risk Factors - diabetes mellitus and hypertension  Plan: 1. HgbA1c, fasting lipid panel 2. MRI, MRA  of the brain without contrast 3. PT consult, OT consult, Speech consult 4. Echocardiogram 5. Carotid dopplers 6. Prophylactic therapy-Continue Effient at current dose  7. Analgesia for headache 8. Telemetry monitoring 9. Frequent neuro checks  Case discussed with Dr. Constance Goltz, MD [facility] [phone] 03/02/2013, 8:35 PM

## 2013-03-02 NOTE — H&P (Addendum)
[facility] History and Physical  [name] Jaegeronie A Smith ZOX:[id number] DOB: 11-26-1950 DOA: 03/02/2013  Referring physician: Dr. Patria Maneampos PCP: No primary provider on file.   Chief Complaint: Slurred speech   HPI: [name] [name] is a 63 y.o. female  With history of migraine headaches (per family), peripheral vascular disease status post stenting, coronary artery disease status post stent in 2011 on effient and aspirin. Presents to the ED after developing slurred speech. Reportedly she has also been complaining of photophobia. Much of the history is obtained from the family since patient is currently resting in somnolent after recent opioid medication administration in the ED. The problem occurred insidiously and has been persistent since onset according to family.  While in the ED neurology was consult at and recommended admission for further evaluation and recommendations. White blood cell count 9.5, temperature 97.6, and troponin within normal limits.   Review of Systems:  Patient unable to provide due to somnolence after opioid administration.  Past Medical History  Diagnosis Date  . Peripheral vascular disease   . Diabetes mellitus   . Coronary artery disease   . Myocardial infarction   . Hypertension    Past Surgical History  Procedure Laterality Date  . Coronary angioplasty    . Abdominal hysterectomy     Social History:  reports that she quit smoking about 3 years ago. Her smoking use included Cigarettes. She smoked 0.00 packs per day. She does not have any smokeless tobacco history on file. She reports that she does not drink alcohol or use illicit drugs.  Allergies  Allergen Reactions  . Latex Rash  . Penicillins Swelling and Rash    Family History  Problem Relation Age of Onset  . Hypertension Mother      Prior to Admission medications   Medication Sig Start Date End Date Taking? Authorizing Provider  amLODipine (NORVASC) 5 MG tablet Take 1 tablet (5 mg total) by mouth  daily. 06/10/12  Yes Lennette Biharihomas A Kelly, MD  aspirin EC 325 MG tablet Take 325 mg by mouth at bedtime. Prior to Niaspan   Yes Historical Provider, MD  atorvastatin (LIPITOR) 80 MG tablet Take 80 mg by mouth daily.   Yes Historical Provider, MD  glimepiride (AMARYL) 4 MG tablet Take 4 mg by mouth daily before breakfast.   Yes Historical Provider, MD  ibuprofen (ADVIL,MOTRIN) 200 MG tablet Take 400 mg by mouth every 6 (six) hours as needed for moderate pain.   Yes Historical Provider, MD  isosorbide mononitrate (IMDUR) 60 MG 24 hr tablet Take 90 mg by mouth daily.   Yes Historical Provider, MD  losartan (COZAAR) 100 MG tablet Take 100 mg by mouth daily.   Yes Historical Provider, MD  metFORMIN (GLUCOPHAGE) 1000 MG tablet Take 1,000 mg by mouth 2 (two) times daily with a meal.   Yes Historical Provider, MD  pantoprazole (PROTONIX) 40 MG tablet Take 40 mg by mouth daily.   Yes Historical Provider, MD  prasugrel (EFFIENT) 10 MG TABS Take 10 mg by mouth daily.   Yes Historical Provider, MD  niacin (NIASPAN) 750 MG CR tablet Take 2 tablets (1,500 mg total) by mouth at bedtime. 08/04/12   Lennette Biharihomas A Kelly, MD   Physical Exam: Filed Vitals:   03/02/13 2145  BP: 113/54  Pulse: 94  Temp:   Resp: 19    BP 113/54  Pulse 94  Temp(Src) 97.6 F (36.4 C) (Oral)  Resp 19  SpO2 96%  General: Resting comfortably, with wet towel over  her eyes, arousable on command. Eyes: PERRL, pinpoint pupils. No icterus ENT: grossly normal hearing, lips & tongue Neck: no LAD, masses or thyromegaly Cardiovascular: RRR, no m/r/g. No LE edema. Respiratory: CTA bilaterally, no w/r/r. Normal respiratory effort. Abdomen: soft, nt, nd Skin: no rash or induration seen on limited exam Musculoskeletal: grossly normal tone BUE/BLE Psychiatric: Limited examination due to somnolence after opioid administration. Neurologic: No facial asymmetry, Limited examination due to somnolence after opioid administration.patient moving extremities  equally           Labs on Admission:  Basic Metabolic Panel:  Recent Labs Lab 03/02/13 1942 03/02/13 1948  NA 132* 134*  K 4.2 4.0  CL 93* 98  CO2 22  --   GLUCOSE 415* 408*  BUN 15 15  CREATININE 0.57 0.70  CALCIUM 9.3  --    Liver Function Tests:  Recent Labs Lab 03/02/13 1942  AST 18  ALT 18  ALKPHOS 98  BILITOT 0.5  PROT 7.5  ALBUMIN 3.5   No results found for this basename: LIPASE, AMYLASE,  in the last 168 hours No results found for this basename: AMMONIA,  in the last 168 hours CBC:  Recent Labs Lab 03/02/13 1942 03/02/13 1948  WBC 9.5  --   NEUTROABS 6.4  --   HGB 15.4* 15.6*  HCT 41.6 46.0  MCV 85.4  --   PLT 224  --    Cardiac Enzymes:  Recent Labs Lab 03/02/13 1942  TROPONINI <0.30    BNP (last 3 results) No results found for this basename: PROBNP,  in the last 8760 hours CBG:  Recent Labs Lab 03/02/13 1959  GLUCAP 380*    Radiological Exams on Admission: Ct Head (brain) Wo Contrast  03/02/2013   CLINICAL DATA:  Code stroke.  EXAM: CT HEAD WITHOUT CONTRAST  TECHNIQUE: Contiguous axial images were obtained from the base of the skull through the vertex without intravenous contrast.  COMPARISON:  No comparisons  FINDINGS: No acute intracranial abnormality. Specifically, no hemorrhage, hydrocephalus, mass lesion, acute infarction, or significant intracranial injury. No acute calvarial abnormality. Visualized paranasal sinuses and mastoids clear. Orbital soft tissues unremarkable.  IMPRESSION: No acute intracranial abnormality.  Critical Value/emergent results were called by telephone at the time of interpretation on 03/02/2013 at 7:54 PM to Dr. Thad Ranger, who verbally acknowledged these results.   Electronically Signed   By: Charlett Nose M.D.   On: 03/02/2013 19:56    EKG: Independently reviewed. Normal sinus rhythm with no ST elevations or depressions  Assessment/Plan Principal Problem:   Stroke-like symptom - Neurology consulted:  addendum: recommend continuing effient at current dose. - Stroke workup underway please refer to orders for details - Telemetry monitoring  Active Problems:   CAD (coronary artery disease): DES to dominant Circ 10/13/09 - Continue Effient, addendum: will not continue aspirin given that patient is on effient    Diabetes mellitus - Place on sliding scale insulin - Diabetic diet - Hemoglobin A1c    Headache(784.0) - Patient reportedly has history of migraine headaches. Sequela could be contributing/or manifesting itself as principal problem  DVT prophylaxis - SCDs  Code Status: Full code Family Communication: Discussed with daughters at bedside Disposition Plan: Pending further recommendations from neurologist  Time spent: > 55 minutes  Penny Pia [facility] Pager [phone]

## 2013-03-02 NOTE — ED Provider Notes (Signed)
CSN: [id number]     Arrival date & time 03/02/13  1937 History   First MD Initiated Contact with Patient 03/02/13 1954     Chief Complaint  Patient presents with  . Code Stroke    HPI As reported that around 6 to 6:30 PM today the patient developed difficulty speaking.  Family reports some slurred speech.  Patient has a history of stroke in 2008.  She also has a history of coronary artery disease and peripheral arterial disease.  She no longer smokes cigarettes.  She continues to have diabetes, hypertension.  She is on effient.  She reports at the same time she developed a left-sided headache.  She presented to the emergency department as a code stroke she denies weakness of her arms or legs.  She states at one point she was having some pain and possible numbness of her right upper extremity.   Past Medical History  Diagnosis Date  . Peripheral vascular disease   . Diabetes mellitus   . Coronary artery disease   . Myocardial infarction   . Hypertension    Past Surgical History  Procedure Laterality Date  . Coronary angioplasty    . Abdominal hysterectomy     Family History  Problem Relation Age of Onset  . Hypertension Mother    History  Substance Use Topics  . Smoking status: Former Smoker    Types: Cigarettes    Quit date: 10/04/2009  . Smokeless tobacco: Not on file  . Alcohol Use: No   OB History   Grav Para Term Preterm Abortions TAB SAB Ect Mult Living                 Review of Systems  All other systems reviewed and are negative.    Allergies  Latex and Penicillins  Home Medications   Current Outpatient Rx  Name  Route  Sig  Dispense  Refill  . amLODipine (NORVASC) 5 MG tablet   Oral   Take 1 tablet (5 mg total) by mouth daily.   30 tablet   3   . aspirin EC 325 MG tablet   Oral   Take 325 mg by mouth at bedtime. Prior to Niaspan         . atorvastatin (LIPITOR) 80 MG tablet   Oral   Take 80 mg by mouth daily.         Marland Kitchen. glimepiride (AMARYL)  4 MG tablet   Oral   Take 4 mg by mouth daily before breakfast.         . isosorbide mononitrate (IMDUR) 60 MG 24 hr tablet   Oral   Take 90 mg by mouth daily.         Marland Kitchen. losartan (COZAAR) 100 MG tablet   Oral   Take 100 mg by mouth daily.         . metFORMIN (GLUCOPHAGE) 1000 MG tablet   Oral   Take 1,000 mg by mouth 2 (two) times daily with a meal.         . niacin (NIASPAN) 750 MG CR tablet   Oral   Take 2 tablets (1,500 mg total) by mouth at bedtime.   60 tablet   1   . pantoprazole (PROTONIX) 40 MG tablet   Oral   Take 40 mg by mouth daily.         . prasugrel (EFFIENT) 10 MG TABS   Oral   Take 10 mg by mouth daily.  BP 115/72  Pulse 106  Temp(Src) 97.6 F (36.4 C) (Oral)  Resp 18  SpO2 97% Physical Exam  Nursing note and vitals reviewed. Constitutional: She is oriented to person, place, and time. She appears well-developed and well-nourished. No distress.  HENT:  Head: Normocephalic and atraumatic.  Eyes: EOM are normal. Pupils are equal, round, and reactive to light.  Neck: Normal range of motion.  Cardiovascular: Normal rate, regular rhythm and normal heart sounds.   Pulmonary/Chest: Effort normal and breath sounds normal.  Abdominal: Soft. She exhibits no distension. There is no tenderness.  Musculoskeletal: Normal range of motion.  Neurological: She is alert and oriented to person, place, and time.  5/5 strength in major muscle groups of  bilateral upper and lower extremities.  Intermittent expressive aphasia without dysarthria. No facial asymetry.   Skin: Skin is warm and dry.  Psychiatric: She has a normal mood and affect. Judgment normal.    ED Course  Procedures (including critical care time) Labs Review Labs Reviewed  CBC - Abnormal; Notable for the following:    Hemoglobin 15.4 (*)    MCHC 37.0 (*)    All other components within normal limits  COMPREHENSIVE METABOLIC PANEL - Abnormal; Notable for the following:     Sodium 132 (*)    Chloride 93 (*)    Glucose, Bld 415 (*)    All other components within normal limits  GLUCOSE, CAPILLARY - Abnormal; Notable for the following:    Glucose-Capillary 380 (*)    All other components within normal limits  POCT I-STAT, CHEM 8 - Abnormal; Notable for the following:    Sodium 134 (*)    Glucose, Bld 408 (*)    Hemoglobin 15.6 (*)    All other components within normal limits  PROTIME-INR  APTT  DIFFERENTIAL  TROPONIN I  POCT I-STAT TROPONIN I   Imaging Review Ct Head (brain) Wo Contrast  03/02/2013   CLINICAL DATA:  Code stroke.  EXAM: CT HEAD WITHOUT CONTRAST  TECHNIQUE: Contiguous axial images were obtained from the base of the skull through the vertex without intravenous contrast.  COMPARISON:  No comparisons  FINDINGS: No acute intracranial abnormality. Specifically, no hemorrhage, hydrocephalus, mass lesion, acute infarction, or significant intracranial injury. No acute calvarial abnormality. Visualized paranasal sinuses and mastoids clear. Orbital soft tissues unremarkable.  IMPRESSION: No acute intracranial abnormality.  Critical Value/emergent results were called by telephone at the time of interpretation on 03/02/2013 at 7:54 PM to Dr. Thad Ranger, who verbally acknowledged these results.   Electronically Signed   By: Charlett Nose M.D.   On: 03/02/2013 19:56  I personally reviewed the imaging tests through PACS system I reviewed available ER/hospitalization records through the EMR   EKG Interpretation    Date/Time:  Tuesday March 02 2013 19:59:18 EST Ventricular Rate:  109 PR Interval:  159 QRS Duration: 97 QT Interval:  386 QTC Calculation: 520 R Axis:   2 Text Interpretation:  Sinus tachycardia Abnormal inferior Q waves Prolonged QT interval No significant change was found Confirmed by Dionicio Shelnutt  MD, Iraida Cragin ([id number]) on 03/02/2013 8:08:07 PM            MDM   1. Expressive aphasia   2. Headache    Stroke versus operative migraine.  Patient  seen in consultation by neurology.  Patient is not a candidate for TPA given her use of anticoagulants.  Please see consultation note for complete details    Lyanne Co, MD 03/02/13 2110

## 2013-03-02 NOTE — ED Notes (Signed)
Patient was riding in car with daughter, sudden onset of left frontal lobe HA.  Patient started having garbled/slurred speech at 1800.  No other deficits per EMS.  Patient denies any weakness or dizziness.  Patient continues to feel like she can't get what she wants to say out.  Patient is normotensive en route to ED by EMS.  Patient is CAOx3 on arrival to ED.

## 2013-03-03 ENCOUNTER — Telehealth (HOSPITAL_COMMUNITY): Payer: Self-pay | Admitting: *Deleted

## 2013-03-03 ENCOUNTER — Encounter (HOSPITAL_COMMUNITY): Payer: Self-pay | Admitting: General Practice

## 2013-03-03 ENCOUNTER — Inpatient Hospital Stay (HOSPITAL_COMMUNITY): Payer: 59

## 2013-03-03 DIAGNOSIS — E119 Type 2 diabetes mellitus without complications: Secondary | ICD-10-CM

## 2013-03-03 DIAGNOSIS — R51 Headache: Secondary | ICD-10-CM

## 2013-03-03 DIAGNOSIS — IMO0002 Reserved for concepts with insufficient information to code with codable children: Secondary | ICD-10-CM

## 2013-03-03 DIAGNOSIS — I739 Peripheral vascular disease, unspecified: Secondary | ICD-10-CM

## 2013-03-03 DIAGNOSIS — IMO0001 Reserved for inherently not codable concepts without codable children: Secondary | ICD-10-CM

## 2013-03-03 DIAGNOSIS — E1165 Type 2 diabetes mellitus with hyperglycemia: Secondary | ICD-10-CM

## 2013-03-03 DIAGNOSIS — I517 Cardiomegaly: Secondary | ICD-10-CM

## 2013-03-03 DIAGNOSIS — G43109 Migraine with aura, not intractable, without status migrainosus: Secondary | ICD-10-CM

## 2013-03-03 DIAGNOSIS — R4701 Aphasia: Secondary | ICD-10-CM

## 2013-03-03 LAB — HEMOGLOBIN A1C
HEMOGLOBIN A1C: 13.2 % — AB (ref <5.7)
Mean Plasma Glucose: 332 mg/dL — ABNORMAL HIGH (ref <117)

## 2013-03-03 LAB — LIPID PANEL
Cholesterol: 242 mg/dL — ABNORMAL HIGH (ref 0–200)
HDL: 33 mg/dL — AB (ref >39)
LDL CALC: 152 mg/dL — AB (ref 0–99)
Total CHOL/HDL Ratio: 7.3 RATIO
Triglycerides: 283 mg/dL — ABNORMAL HIGH (ref <150)
VLDL: 57 mg/dL — ABNORMAL HIGH (ref 0–40)

## 2013-03-03 LAB — GLUCOSE, CAPILLARY
GLUCOSE-CAPILLARY: 271 mg/dL — AB (ref 70–99)
Glucose-Capillary: 159 mg/dL — ABNORMAL HIGH (ref 70–99)
Glucose-Capillary: 131 mg/dL — ABNORMAL HIGH (ref 70–99)
Glucose-Capillary: 171 mg/dL — ABNORMAL HIGH (ref 70–99)

## 2013-03-03 MED ORDER — NIACIN ER 500 MG PO CPCR
1500.0000 mg | ORAL_CAPSULE | Freq: Every day | ORAL | Status: DC
  Administered 2013-03-03: 1500 mg via ORAL
  Filled 2013-03-03 (×2): qty 3

## 2013-03-03 MED ORDER — INSULIN ASPART 100 UNIT/ML ~~LOC~~ SOLN
0.0000 [IU] | Freq: Three times a day (TID) | SUBCUTANEOUS | Status: DC
  Administered 2013-03-03: 8 [IU] via SUBCUTANEOUS
  Administered 2013-03-04: 5 [IU] via SUBCUTANEOUS
  Administered 2013-03-04: 11 [IU] via SUBCUTANEOUS

## 2013-03-03 MED ORDER — ATORVASTATIN CALCIUM 80 MG PO TABS
80.0000 mg | ORAL_TABLET | Freq: Every day | ORAL | Status: DC
  Administered 2013-03-03 – 2013-03-04 (×2): 80 mg via ORAL
  Filled 2013-03-03 (×2): qty 1

## 2013-03-03 MED ORDER — SENNOSIDES-DOCUSATE SODIUM 8.6-50 MG PO TABS: 1.0000 | ORAL_TABLET | Freq: Every evening | ORAL | Status: DC | PRN

## 2013-03-03 MED ORDER — INSULIN ASPART 100 UNIT/ML ~~LOC~~ SOLN
0.0000 [IU] | Freq: Three times a day (TID) | SUBCUTANEOUS | Status: DC
Start: 2013-03-03 — End: 2013-03-03
  Administered 2013-03-03: 4 [IU] via SUBCUTANEOUS
  Administered 2013-03-03: 3 [IU] via SUBCUTANEOUS

## 2013-03-03 MED ORDER — PANTOPRAZOLE SODIUM 40 MG PO TBEC
40.0000 mg | DELAYED_RELEASE_TABLET | Freq: Every day | ORAL | Status: DC
  Administered 2013-03-03 – 2013-03-04 (×2): 40 mg via ORAL
  Filled 2013-03-03 (×4): qty 1

## 2013-03-03 MED ORDER — INSULIN GLARGINE 100 UNIT/ML ~~LOC~~ SOLN
15.0000 [IU] | Freq: Every day | SUBCUTANEOUS | Status: DC
  Administered 2013-03-03: 15 [IU] via SUBCUTANEOUS
  Filled 2013-03-03 (×2): qty 0.15

## 2013-03-03 MED ORDER — PRASUGREL HCL 10 MG PO TABS
10.0000 mg | ORAL_TABLET | Freq: Every day | ORAL | Status: DC
  Administered 2013-03-03 – 2013-03-04 (×2): 10 mg via ORAL
  Filled 2013-03-03 (×2): qty 1

## 2013-03-03 MED ORDER — NIACIN ER (ANTIHYPERLIPIDEMIC) 500 MG PO TBCR: 1500.0000 mg | EXTENDED_RELEASE_TABLET | Freq: Every day | ORAL | Status: DC

## 2013-03-03 MED ORDER — ISOSORBIDE MONONITRATE ER 60 MG PO TB24
90.0000 mg | ORAL_TABLET | Freq: Every day | ORAL | Status: DC
  Administered 2013-03-03 – 2013-03-04 (×2): 90 mg via ORAL
  Filled 2013-03-03 (×2): qty 1

## 2013-03-03 MED ORDER — ASPIRIN EC 325 MG PO TBEC
325.0000 mg | DELAYED_RELEASE_TABLET | Freq: Every day | ORAL | Status: DC
  Filled 2013-03-03: qty 1

## 2013-03-03 MED ORDER — INSULIN ASPART 100 UNIT/ML ~~LOC~~ SOLN: 0.0000 [IU] | Freq: Every day | SUBCUTANEOUS | Status: DC

## 2013-03-03 MED ORDER — LOSARTAN POTASSIUM 50 MG PO TABS
100.0000 mg | ORAL_TABLET | Freq: Every day | ORAL | Status: DC
  Administered 2013-03-03 – 2013-03-04 (×2): 100 mg via ORAL
  Filled 2013-03-03 (×2): qty 2

## 2013-03-03 MED ORDER — AMLODIPINE BESYLATE 5 MG PO TABS
5.0000 mg | ORAL_TABLET | Freq: Every day | ORAL | Status: DC
  Administered 2013-03-03 – 2013-03-04 (×2): 5 mg via ORAL
  Filled 2013-03-03 (×2): qty 1

## 2013-03-03 NOTE — Progress Notes (Signed)
Subjective: Patient had no new complaints. Speech difficulty has resolved. Headache has diminished in intensity down to 5/10.  Objective: Current vital signs: BP 118/59  Pulse 98  Temp(Src) 98 F (36.7 C) (Oral)  Resp 20  Ht 5\' 4"  (1.626 m)  Wt 76.93 kg (169 lb 9.6 oz)  BMI 29.10 kg/m2  SpO2 98%  Neurologic Exam: Alert and in no acute distress. Mental status was normal. Patient had no indications of receptive or expressive aphasia. Speech was normal with no dysarthria. No facial weakness was noted. Patient moved extremities equally with no signs of focal weakness.  MRI of the brain showed no signs of an acute infarction. 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 Entire anterior circulation on the right was applied from the left carotid artery via the anterior communicating artery. 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 There was no significant stenosis involving the left internal carotid artery. Vertebral artery flow was antegrade bilaterally.  Medications: I have reviewed the patient's current medications.  Assessment/Plan: 63 year old lady who presented with probable complicated migraine headache with neurologic deficit of aphasia resolved at this point and headache markedly improved. TIA is less likely but cannot be completely ruled out. Stroke workup to this point has been unremarkable. No stroke was demonstrated on MRI study. 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 Echocardiogram is pending. Carotid Doppler study showed no significant stenosis of left internal carotid artery. No further workup is indicated if echocardiogram was unremarkable.    Recommend continuing aspirin 325 mg per day. Continue Lipitor 80 mg per day.  C.R. Roseanne Reno, MD [facility] [phone]  03/03/2013  1:27 PM

## 2013-03-03 NOTE — Progress Notes (Signed)
Dr. Thad Ranger paged to notify current [facility] of 6. She is aware of patient receiving pain medication in the ER. No new orders given at this time. Will continue to monitor patient per shift.

## 2013-03-03 NOTE — Evaluation (Signed)
SLP Cancellation Note  Patient Details Name: [name] [name] MRN: [id number] DOB: Aug 18, 1950   Cancelled treatment:       Reason Eval/Treat Not Completed: Patient at procedure or test/unavailable (pt was at MRI, will reattempt later)   Donavan Burnet, MS [facility] SLP [phone]  SM:[id number]

## 2013-03-03 NOTE — Progress Notes (Signed)
[facility] PROGRESS NOTE  [name] [name] ZOX:[id number] DOB: [birth year] DOA: 03/02/2013 PCP: No primary provider on file.  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 Right carotid occlusion -This has been chronic which patient is aware Diabetes mellitus type 2, uncontrolled -Start Lantus 15 units at bedtime -Continue NovoLog sliding scale -Consult diabetic educator to provide education -Consult nutritionist Hyperlipidemia -LDL 152 -Lipitor increased to 80mg  daily -continue niacin 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  EXAM: CHEST  2 VIEW  COMPARISON:  05/29/2011  FINDINGS: Interstitial coarsening which slightly greater than baseline. No edema or consolidation. No effusion or pneumothorax. Borderline cardiomegaly.  IMPRESSION: Interstitial coarsening which could be bronchitic or congestive.   Electronically Signed   By: Tiburcio Pea M.D.   On: 03/02/2013 22:48   Ct Head (brain) Wo Contrast  03/02/2013   CLINICAL DATA:  Code stroke.  EXAM: CT HEAD WITHOUT CONTRAST  TECHNIQUE: Contiguous axial images were obtained from the base of the skull through the vertex without intravenous contrast.  COMPARISON:  No comparisons  FINDINGS: No acute intracranial abnormality. Specifically, no hemorrhage, hydrocephalus, mass lesion, acute infarction, or significant intracranial injury. No acute calvarial abnormality.  Visualized paranasal sinuses and mastoids clear. Orbital soft tissues unremarkable.  IMPRESSION: No acute intracranial abnormality.  Critical Value/emergent results were called by telephone at the time of interpretation on 03/02/2013 at 7:54 PM to Dr. Thad Ranger, who verbally acknowledged these results.   Electronically Signed   By: Charlett Nose M.D.   On: 03/02/2013 19:56   Mr Brain Wo Contrast  03/03/2013   CLINICAL DATA:  Headache.  Vascular disease.  Slurred speech.  EXAM: MRI HEAD WITHOUT CONTRAST  MRA HEAD WITHOUT CONTRAST  TECHNIQUE: Multiplanar, multiecho pulse sequences of the brain and surrounding structures were obtained without intravenous contrast. Angiographic images of the head were obtained using MRA technique without contrast.  COMPARISON:  Head CT 03/02/2013.  MRI 05/02/2003.  FINDINGS: MRI HEAD FINDINGS  Diffusion imaging does not show any acute or subacute infarction. There are minimal chronic small vessel changes of the pons. There are a few old small vessel cerebellar infarctions. The cerebral hemispheres show mild chronic small-vessel change affecting the thalami in the hemispheric white matter. No cortical or large vessel territory infarction. No mass lesion, hemorrhage, hydrocephalus or extra-axial collection. No pituitary mass. No inflammatory sinus disease. No skull or skullbase abnormality.  MRA HEAD FINDINGS  The left internal carotid artery is widely patent into the brain. There is a 2.5 mm aneurysm projecting posteriorly from the internal carotid artery at the proximal siphon. The anterior and middle cerebral vessels are widely patent. Flow extends through the anterior communicating artery to the right anterior circulation. There is no antegrade flow a in the right internal carotid artery. Flow is present throughout the right anterior and middle cerebral artery territories, though the more distal branch vessels show some irregularity.  Both vertebral arteries are widely patent to the  basilar. No basilar stenosis. Posterior circulation branch vessels are patent.  IMPRESSION: No acute infarction. Mild chronic small vessel disease throughout the brain as outlined above.  Occlusion of the right internal carotid artery without antegrade flow at the skullbase. Patent anterior communicating artery with the entire anterior circulation being supplied from the left carotid.  2.5 mm aneurysm projecting posteriorly from the proximal carotid siphon on the left. Similar to the study of 2005.   Electronically Signed   By: Paulina Fusi M.D.   On: 03/03/2013 12:27   Mr Maxine Glenn Head/brain Wo Cm  03/03/2013   CLINICAL DATA:  Headache.  Vascular disease.  Slurred speech.  EXAM: MRI HEAD WITHOUT CONTRAST  MRA HEAD WITHOUT CONTRAST  TECHNIQUE: Multiplanar, multiecho pulse sequences of the brain and surrounding structures were obtained without intravenous contrast. Angiographic images of the head were obtained using MRA technique without contrast.  COMPARISON:  Head CT 03/02/2013.  MRI 05/02/2003.  FINDINGS: MRI HEAD FINDINGS  Diffusion imaging does not show any acute or subacute infarction. There are minimal chronic small vessel changes of the pons. There are a few old small vessel cerebellar infarctions. The cerebral hemispheres show mild chronic small-vessel change affecting the thalami in the hemispheric white matter. No cortical or large vessel territory infarction. No mass lesion, hemorrhage, hydrocephalus or extra-axial collection. No pituitary mass. No inflammatory sinus disease. No skull or skullbase abnormality.  MRA HEAD FINDINGS  The left internal carotid artery is widely patent into the brain. There is a 2.5 mm aneurysm projecting posteriorly from the internal carotid artery at the proximal siphon. The anterior and middle cerebral vessels are widely patent. Flow extends through the anterior communicating artery to the right anterior circulation. There is no antegrade flow a in the right internal carotid  artery. Flow is present throughout the right anterior and middle cerebral artery territories, though the more distal branch vessels show some irregularity.  Both vertebral arteries are widely patent to the basilar. No basilar stenosis. Posterior circulation branch vessels are patent.  IMPRESSION: No acute infarction. Mild chronic small vessel disease throughout the brain as outlined above.  Occlusion of the right internal carotid artery without antegrade flow at the skullbase. Patent anterior communicating artery with the entire anterior circulation being supplied from the left carotid.  2.5 mm aneurysm projecting posteriorly from the proximal carotid siphon on the left. Similar to the study of 2005.   Electronically Signed   By: Paulina Fusi M.D.   On: 03/03/2013 12:27         Subjective: Patient is feeling better. Denies any headache, visual disturbance, fever, chills, chest pain, shortness breath, nausea, vomiting, diarrhea, abdominal pain.  Objective: Filed Vitals:   03/03/13 0220 03/03/13 0438 03/03/13 0622 03/03/13 1504  BP: 127/58 100/50 118/59 132/78  Pulse: 104 99 98 99  Temp: 97.4 F (36.3 C) 97.9 F (36.6 C) 98 F (36.7 C) 98.2 F (36.8 C)  TempSrc: Oral Oral Oral Oral  Resp: 18 18 20 20   Height:      Weight:      SpO2: 96% 99% 98% 95%   No intake or output data in the 24 hours ending 03/03/13 1630 Weight change:  Exam:   General:  Pt is alert, follows commands appropriately, not in acute distress  HEENT: No icterus, No thrush,[place]/AT  Cardiovascular: RRR, S1/S2, no rubs, no gallops  Respiratory: CTA bilaterally, no wheezing, no crackles, no rhonchi  Abdomen: Soft/+BS, non tender, non distended, no guarding  Extremities: No edema, No lymphangitis, No petechiae, No rashes, no synovitis  Data Reviewed: Basic  Metabolic Panel:  Recent Labs Lab 03/02/13 1942 03/02/13 1948  NA 132* 134*  K 4.2 4.0  CL 93* 98  CO2 22  --   GLUCOSE 415* 408*  BUN 15 15   CREATININE 0.57 0.70  CALCIUM 9.3  --    Liver Function Tests:  Recent Labs Lab 03/02/13 1942  AST 18  ALT 18  ALKPHOS 98  BILITOT 0.5  PROT 7.5  ALBUMIN 3.5   No results found for this basename: LIPASE, AMYLASE,  in the last 168 hours No results found for this basename: AMMONIA,  in the last 168 hours CBC:  Recent Labs Lab 03/02/13 1942 03/02/13 1948  WBC 9.5  --   NEUTROABS 6.4  --   HGB 15.4* 15.6*  HCT 41.6 46.0  MCV 85.4  --   PLT 224  --    Cardiac Enzymes:  Recent Labs Lab 03/02/13 1942  TROPONINI <0.30   BNP: No components found with this basename: POCBNP,  CBG:  Recent Labs Lab 03/02/13 1959 03/02/13 2321 03/03/13 0626 03/03/13 1142  GLUCAP 380* 279* 159* 131*    No results found for this or any previous visit (from the past 240 hour(s)).   Scheduled Meds: . amLODipine  5 mg Oral Daily  . atorvastatin  80 mg Oral Daily  . insulin aspart  0-15 Units Subcutaneous TID WC  . insulin aspart  0-5 Units Subcutaneous QHS  . insulin glargine  15 Units Subcutaneous QHS  . isosorbide mononitrate  90 mg Oral Daily  . losartan  100 mg Oral Daily  . niacin  1,500 mg Oral QHS  . pantoprazole  40 mg Oral Daily  . prasugrel  10 mg Oral Daily   Continuous Infusions:    [name] Eslick, DO  [facility] Pager [phone]  If 7PM-7AM, please contact night-coverage [email or web address] Password [email or web address] 03/03/2013, 4:30 PM   LOS: 1 day

## 2013-03-03 NOTE — Evaluation (Signed)
Physical Therapy Evaluation Patient Details Name: [name] [name] MRN: [id number] DOB: 1950-04-29 Today's Date: 03/03/2013 Time: 0801-0826 PT Time Calculation (min): 25 min  PT Assessment / Plan / Recommendation History of Present Illness  pt presents with speech difficulties.    Clinical Impression  Pt and daughter both indicating that pt's mobility is at baseline.  Pt does have balance deficits at baseline and states that her balance depends on how her neuropathies feel each day.  Pt has all needed DME and good support from her daughter.  No further therapy needs at this time.  Will notify OT of pt being at baseline and sign off.      PT Assessment  Patent does not need any further PT services    Follow Up Recommendations  No PT follow up;Supervision - Intermittent    Does the patient have the potential to tolerate intense rehabilitation      Barriers to Discharge        Equipment Recommendations  None recommended by PT    Recommendations for Other Services     Frequency      Precautions / Restrictions Precautions Precautions: Fall Restrictions Weight Bearing Restrictions: No   Pertinent Vitals/Pain Indicates tingling in Bil feet from neuropathies.        Mobility  Bed Mobility Overal bed mobility: Modified Independent General bed mobility comments: Needs icnreased time.   Transfers Overall transfer level: Modified independent Equipment used: None Ambulation/Gait Ambulation/Gait assistance: Supervision Ambulation Distance (Feet): 50 Feet (and 50) Assistive device: None Gait Pattern/deviations: Step-through pattern;Decreased stride length;Trunk flexed General Gait Details: pt indicates sometimes she uses AD at home when Neuropathies are bad and sometimes no AD.  pt and daughter both indicate mobility is at baseline.   Stairs: Yes Stairs assistance: Supervision Stair Management: One rail Left;Step to pattern;Forwards Number of Stairs: 2 General stair comments: pt  uses Bil UEs on rail and leans on rail for support.  Per daughter and pt this is baseline for pt.   Modified Rankin (Stroke Patients Only) Pre-Morbid Rankin Score: Moderate disability Modified Rankin: Moderate disability    Exercises     PT Diagnosis:    PT Problem List:   PT Treatment Interventions:       PT Goals(Current goals can be found in the care plan section)    Visit Information  Last PT Received On: 03/03/13 Assistance Needed: +1 History of Present Illness: pt presents with speech difficulties.         Prior Functioning  Home Living Family/patient expects to be discharged to:: Private residence Living Arrangements: Children Available Help at Discharge: Family;Available PRN/intermittently Type of Home: House Home Access: Stairs to enter [employer] of Steps: 2 Entrance Stairs-Rails: Left Home Layout: One level Home Equipment: Walker - 2 wheels;Walker - 4 wheels;Cane - single point;Shower seat;Toilet riser Prior Function Level of Independence: Needs assistance ADL's / Homemaking Assistance Needed: Daughter A with homemaking and getting in/out of tub.  Occasionally daughter A with dressing. Communication Communication: No difficulties    Cognition  Cognition Arousal/Alertness: Awake/alert Behavior During Therapy: WFL for tasks assessed/performed Overall Cognitive Status: Within Functional Limits for tasks assessed    Extremity/Trunk Assessment Upper Extremity Assessment Upper Extremity Assessment: Generalized weakness Lower Extremity Assessment Lower Extremity Assessment: Generalized weakness Cervical / Trunk Assessment Cervical / Trunk Assessment: Kyphotic   Balance Balance Overall balance assessment: Modified Independent  End of Session PT - End of Session Equipment Utilized During Treatment: Gait belt Activity Tolerance: Patient tolerated treatment well Patient  left: in bed;with call bell/phone within reach;with family/visitor present  (sitting EOB) Nurse Communication: Mobility status  GP     Sunny SchleinRitenour, Decklyn Hyder F, [place] CarolinaPT [phone] 03/03/2013, 8:37 AM

## 2013-03-03 NOTE — Progress Notes (Addendum)
Bilateral carotid artery duplex completed.  Left:  1-39% ICA stenosis.  Right:  Reversal of flow in the proximal ICA and loss of diastolic flow in the CCA suggest occlusion.  Bilateral:  Vertebral artery flow is antegrade.

## 2013-03-03 NOTE — Progress Notes (Signed)
Echo Lab  2D Echocardiogram completed.  Janus Vlcek L Elinor Kleine, RDCS 03/03/2013 10:05 AM

## 2013-03-03 NOTE — Progress Notes (Signed)
OT Cancellation Note  Patient Details Name: [name] [name] MRN: [id number] DOB: [birth year]   Cancelled Treatment:    Reason Eval/Treat Not Completed: OT screened, no needs identified, will sign off    Earlie Raveling OTR/L [phone] 03/03/2013, 8:46 AM

## 2013-03-03 NOTE — Progress Notes (Signed)
UR complete.  Cormick Moss RN, MSN 

## 2013-03-03 NOTE — Progress Notes (Signed)
Inpatient Diabetes Program Recommendations  AACE/ADA: New Consensus Statement on Inpatient Glycemic Control (2013)  Target Ranges:  Prepandial:   less than 140 mg/dL      Peak postprandial:   less than 180 mg/dL (1-2 hours)      Critically ill patients:  140 - 180 mg/dL   Reason for Visit: Results for LUVERNA, JOHANNES (MRN [id number]) as of 03/03/2013 10:14  Ref. Range 03/02/2013 19:59 03/02/2013 23:21 03/03/2013 06:26  Glucose-Capillary Latest Range: 70-99 mg/dL 915 (H) 056 (H) 979 (H)   Diabetes history: Type 2 Outpatient Diabetes medications: Amaryl 4 mg with breakfast, Metformin 1000 mg bid with meals Current orders for Inpatient glycemic control: Novolog resistant correction tid with meals and HS    A1C pending.  May consider adding Lantus 15 units daily while in the hospital.    Will follow. Beryl Meager, RN, BC-ADM Inpatient Diabetes Coordinator Pager [phone]

## 2013-03-04 DIAGNOSIS — R51 Headache: Secondary | ICD-10-CM

## 2013-03-04 DIAGNOSIS — I2510 Atherosclerotic heart disease of native coronary artery without angina pectoris: Secondary | ICD-10-CM

## 2013-03-04 DIAGNOSIS — G43109 Migraine with aura, not intractable, without status migrainosus: Principal | ICD-10-CM

## 2013-03-04 DIAGNOSIS — R29818 Other symptoms and signs involving the nervous system: Secondary | ICD-10-CM

## 2013-03-04 DIAGNOSIS — R4701 Aphasia: Secondary | ICD-10-CM

## 2013-03-04 LAB — CBC
HCT: 40.4 % (ref 36.0–46.0)
Hemoglobin: 14.4 g/dL (ref 12.0–15.0)
MCH: 30.8 pg (ref 26.0–34.0)
MCHC: 35.6 g/dL (ref 30.0–36.0)
MCV: 86.3 fL (ref 78.0–100.0)
Platelets: 194 10*3/uL (ref 150–400)
RBC: 4.68 MIL/uL (ref 3.87–5.11)
RDW: 13.6 % (ref 11.5–15.5)
WBC: 7.8 10*3/uL (ref 4.0–10.5)

## 2013-03-04 LAB — PROTIME-INR
INR: 1.06 (ref 0.00–1.49)
PROTHROMBIN TIME: 13.6 s (ref 11.6–15.2)

## 2013-03-04 LAB — BASIC METABOLIC PANEL
BUN: 12 mg/dL (ref 6–23)
CALCIUM: 9.1 mg/dL (ref 8.4–10.5)
CO2: 21 mEq/L (ref 19–32)
CREATININE: 0.56 mg/dL (ref 0.50–1.10)
Chloride: 103 mEq/L (ref 96–112)
Glucose, Bld: 234 mg/dL — ABNORMAL HIGH (ref 70–99)
Potassium: 4.2 mEq/L (ref 3.7–5.3)
Sodium: 139 mEq/L (ref 137–147)

## 2013-03-04 LAB — GLUCOSE, CAPILLARY
GLUCOSE-CAPILLARY: 229 mg/dL — AB (ref 70–99)
Glucose-Capillary: 318 mg/dL — ABNORMAL HIGH (ref 70–99)

## 2013-03-04 LAB — APTT: APTT: 28 s (ref 24–37)

## 2013-03-04 MED ORDER — INSULIN PEN STARTER KIT
1.0000 | Freq: Once | Status: AC
  Administered 2013-03-04: 1
  Filled 2013-03-04: qty 1

## 2013-03-04 MED ORDER — INSULIN PEN NEEDLE 32G X 4 MM MISC: Status: DC

## 2013-03-04 MED ORDER — INSULIN GLARGINE 100 UNIT/ML SOLOSTAR PEN: 15.0000 [IU] | PEN_INJECTOR | Freq: Every day | SUBCUTANEOUS | Status: DC

## 2013-03-04 NOTE — Progress Notes (Signed)
  RD consulted for nutrition education regarding diabetes.   Lab Results  Component Value Date   HGBA1C 13.2* 03/03/2013   Pt reports that she has not been taking her medications for diabetes for the past 3 months. Pt states she has received diabetes diet education in the past but, she is interested in reviewing diet education again. RD provided "Carbohydrate Counting for People with Diabetes" handout from the Academy of Nutrition and Dietetics. Discussed different food groups and their effects on blood sugar, emphasizing carbohydrate-containing foods. Provided list of carbohydrates and recommended serving sizes of common foods. Provided examples of ways to balance meals/snacks and encouraged intake of high-fiber, whole grain complex carbohydrates. Pt reports using Splenda, avoiding desserts, eating sugar-free cookies, and avoiding fruits/fruit juice PTA. She reports eating large portions of potatoes, bread, and pinto beans PTA which make her blood sugar go up. Pt also reports eating 2 eggs and 2 slices of bacon every morning.   Lipid Panel     Component Value Date/Time   CHOL 242* 03/03/2013 0431   TRIG 283* 03/03/2013 0431   HDL 33* 03/03/2013 0431   CHOLHDL 7.3 03/03/2013 0431   VLDL 57* 03/03/2013 0431   LDLCALC 152* 03/03/2013 0431   Provided "Cardiac Nutrition Therapy" handout from the Academy of Nutrition and Dietetics. Encouraged pt to reduce intake of saturated fat. Encouraged intake of fresh fruits and vegetables, whole grains, and fish.  Teach back method used. Pt voices understanding. Pt expresses goals to decrease portions sizes of carbohydrate containing foods, eat more fish, eat less bacon, and eat more vegetables.   Expect good compliance.  Body mass index is 29.1 kg/(m^2). Pt meets criteria for Overweight based on current BMI.  Current diet order is Carb Modified, patient is consuming approximately 75% of meals at this time. Labs and medications reviewed. No further nutrition  interventions warranted at this time. RD contact information provided. If additional nutrition issues arise, please re-consult RD.  Ian Malkin RD, LDN Inpatient Clinical Dietitian Pager: [phone] After Hours Pager: [phone]

## 2013-03-04 NOTE — Discharge Summary (Signed)
Physician Discharge Summary  [name] [name] WGN:[id number] DOB: 1950/10/17 DOA: 03/02/2013  PCP: Georgann HousekeeperHUSAIN,KARRAR, MD  Admit date: 03/02/2013 Discharge date: 03/04/2013  Recommendations for Outpatient Follow-up:  1. Pt will need to follow up with PCP in 2 weeks post discharge 2. Please obtain BMP to evaluate electrolytes and kidney function 3. 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 The patient was instructed to check her sugars before each meal and at bedtime and to keep her glycemic log. 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 female  With history of migraine headaches (per family), peripheral vascular disease status post stenting, coronary artery disease status post stent in 2011 on effient and aspirin. Presents to the ED after developing slurred speech. Reportedly she has also been complaining of photophobia. Much of the history is obtained from the family since patient is currently resting in somnolent after recent opioid medication administration in the ED. The problem occurred insidiously and has been persistent since onset according to family.  While in the ED neurology was consult at and recommended admission for further evaluation and recommendations. White blood cell count 9.5, temperature 97.6, and troponin within normal limits. . Workup was undertaken as discussed above. There was no acute stroke noted on MRI. The patient was found to have a migraine. Her focal deficits resolved during the hospitalization and did not recur. The patient's diabetes was optimized. The diabetic educator provided education for the patient. She will follow up with endocrinology in the outpatient setting. The patient's blood pressure was optimized.       Consultants: neurology  Discharge Exam: Filed Vitals:   03/04/13 1023  BP: 119/49  Pulse: 88  Temp: 97.6 F (36.4 C)  Resp: 18   Filed Vitals:   03/04/13 0113 03/04/13 0526 03/04/13 1001 03/04/13 1023  BP: 112/55 132/60 130/57 119/49  Pulse: 88 86  88  Temp: 98.3 F (36.8 C) 98.4 F (36.9  C)  97.6 F (36.4 C)  TempSrc: Oral Oral  Oral  Resp: 20 18  18   Height:      Weight:      SpO2: 98% 99%  97%   General: A&O x 3, NAD, pleasant,  cooperative Cardiovascular: RRR, no rub, no gallop, no S3 Respiratory: CTAB, no wheeze, no rhonchi Abdomen:soft, nontender, nondistended, positive bowel sounds Extremities: No edema, No lymphangitis, no petechiae  Discharge Instructions  Discharge Orders   Future Appointments Provider Department Dept Phone   03/16/2013 8:15 AM Carlus Pavlov, MD [facility] End[facility]ology [phone]   Future Orders Complete By Expires   Ambulatory referral to Nutrition and Diabetic Education  As directed    Comments:     A1c 13.2%.  History of Dm X 10 years.  Being d/c'd on insulin.  Patient: Please call the [facility] after discharge to schedule an appointment for diabetes education if you do not hear from the center before discharge  [phone]   Diet - low sodium heart healthy  As directed    Increase activity slowly  As directed        Medication List    STOP taking these medications       glimepiride 4 MG tablet  Commonly known as:  AMARYL     ibuprofen 200 MG tablet  Commonly known as:  ADVIL,MOTRIN      TAKE these medications       amLODipine 5 MG tablet  Commonly known as:  NORVASC  Take 1 tablet (5 mg total) by mouth daily.     aspirin EC 325 MG tablet  Take 325 mg by mouth at bedtime. Prior to Niaspan     atorvastatin 80 MG tablet  Commonly known as:  LIPITOR  Take 80 mg by mouth daily.     Insulin Glargine 100 UNIT/ML Solostar Pen  Commonly known as:  LANTUS SOLOSTAR  Inject 15 Units into the skin daily at 10 pm.     Insulin Pen Needle 32G X 4 MM Misc  Use with your insulin pen to dispense insulin every day     isosorbide mononitrate 60 MG 24 hr tablet  Commonly known as:  IMDUR  Take 90 mg by mouth daily.     losartan 100 MG tablet  Commonly known as:  COZAAR  Take 100 mg by mouth daily.     metFORMIN 1000 MG tablet  Commonly known as:  GLUCOPHAGE  Take 1,000 mg by mouth 2 (two) times daily with a meal.      niacin 750 MG CR tablet  Commonly known as:  NIASPAN  Take 2 tablets (1,500 mg total) by mouth at bedtime.     pantoprazole 40 MG tablet  Commonly known as:  PROTONIX  Take 40 mg by mouth daily.     prasugrel 10 MG Tabs tablet  Commonly known as:  EFFIENT  Take 10 mg by mouth daily.         The results of significant diagnostics from this hospitalization (including imaging, microbiology, ancillary and laboratory) are listed below for reference.    Significant Diagnostic Studies: Dg Chest 2 View  03/02/2013   CLINICAL DATA:  Stroke.  EXAM: CHEST  2 VIEW  COMPARISON:  05/29/2011  FINDINGS: Interstitial coarsening which slightly greater than baseline. No edema or consolidation. No effusion or pneumothorax. Borderline cardiomegaly.  IMPRESSION: Interstitial coarsening which could be bronchitic or congestive.   Electronically Signed   By: Christiane Ha  Watts M.D.   On: 03/02/2013 22:48   Ct Head (brain) Wo Contrast  03/02/2013   CLINICAL DATA:  Code stroke.  EXAM: CT HEAD WITHOUT CONTRAST  TECHNIQUE: Contiguous axial images were obtained from the base of the skull through the vertex without intravenous contrast.  COMPARISON:  No comparisons  FINDINGS: No acute intracranial abnormality. Specifically, no hemorrhage, hydrocephalus, mass lesion, acute infarction, or significant intracranial injury. No acute calvarial abnormality. Visualized paranasal sinuses and mastoids clear. Orbital soft tissues unremarkable.  IMPRESSION: No acute intracranial abnormality.  Critical Value/emergent results were called by telephone at the time of interpretation on 03/02/2013 at 7:54 PM to Dr. Thad Ranger, who verbally acknowledged these results.   Electronically Signed   By: Charlett Nose M.D.   On: 03/02/2013 19:56   Mr Brain Wo Contrast  03/03/2013   CLINICAL DATA:  Headache.  Vascular disease.  Slurred speech.  EXAM: MRI HEAD WITHOUT CONTRAST  MRA HEAD WITHOUT CONTRAST  TECHNIQUE: Multiplanar, multiecho pulse sequences  of the brain and surrounding structures were obtained without intravenous contrast. Angiographic images of the head were obtained using MRA technique without contrast.  COMPARISON:  Head CT 03/02/2013.  MRI 05/02/2003.  FINDINGS: MRI HEAD FINDINGS  Diffusion imaging does not show any acute or subacute infarction. There are minimal chronic small vessel changes of the pons. There are a few old small vessel cerebellar infarctions. The cerebral hemispheres show mild chronic small-vessel change affecting the thalami in the hemispheric white matter. No cortical or large vessel territory infarction. No mass lesion, hemorrhage, hydrocephalus or extra-axial collection. No pituitary mass. No inflammatory sinus disease. No skull or skullbase abnormality.  MRA HEAD FINDINGS  The left internal carotid artery is widely patent into the brain. There is a 2.5 mm aneurysm projecting posteriorly from the internal carotid artery at the proximal siphon. The anterior and middle cerebral vessels are widely patent. Flow extends through the anterior communicating artery to the right anterior circulation. There is no antegrade flow a in the right internal carotid artery. Flow is present throughout the right anterior and middle cerebral artery territories, though the more distal branch vessels show some irregularity.  Both vertebral arteries are widely patent to the basilar. No basilar stenosis. Posterior circulation branch vessels are patent.  IMPRESSION: No acute infarction. Mild chronic small vessel disease throughout the brain as outlined above.  Occlusion of the right internal carotid artery without antegrade flow at the skullbase. Patent anterior communicating artery with the entire anterior circulation being supplied from the left carotid.  2.5 mm aneurysm projecting posteriorly from the proximal carotid siphon on the left. Similar to the study of 2005.   Electronically Signed   By: Paulina Fusi M.D.   On: 03/03/2013 12:27   Mr Maxine Glenn  Head/brain Wo Cm  03/03/2013   CLINICAL DATA:  Headache.  Vascular disease.  Slurred speech.  EXAM: MRI HEAD WITHOUT CONTRAST  MRA HEAD WITHOUT CONTRAST  TECHNIQUE: Multiplanar, multiecho pulse sequences of the brain and surrounding structures were obtained without intravenous contrast. Angiographic images of the head were obtained using MRA technique without contrast.  COMPARISON:  Head CT 03/02/2013.  MRI 05/02/2003.  FINDINGS: MRI HEAD FINDINGS  Diffusion imaging does not show any acute or subacute infarction. There are minimal chronic small vessel changes of the pons. There are a few old small vessel cerebellar infarctions. The cerebral hemispheres show mild chronic small-vessel change affecting the thalami in the hemispheric white matter. No cortical or large vessel territory infarction. No mass lesion,  hemorrhage, hydrocephalus or extra-axial collection. No pituitary mass. No inflammatory sinus disease. No skull or skullbase abnormality.  MRA HEAD FINDINGS  The left internal carotid artery is widely patent into the brain. There is a 2.5 mm aneurysm projecting posteriorly from the internal carotid artery at the proximal siphon. The anterior and middle cerebral vessels are widely patent. Flow extends through the anterior communicating artery to the right anterior circulation. There is no antegrade flow a in the right internal carotid artery. Flow is present throughout the right anterior and middle cerebral artery territories, though the more distal branch vessels show some irregularity.  Both vertebral arteries are widely patent to the basilar. No basilar stenosis. Posterior circulation branch vessels are patent.  IMPRESSION: No acute infarction. Mild chronic small vessel disease throughout the brain as outlined above.  Occlusion of the right internal carotid artery without antegrade flow at the skullbase. Patent anterior communicating artery with the entire anterior circulation being supplied from the left  carotid.  2.5 mm aneurysm projecting posteriorly from the proximal carotid siphon on the left. Similar to the study of 2005.   Electronically Signed   By: Paulina Fusi M.D.   On: 03/03/2013 12:27     Microbiology: No results found for this or any previous visit (from the past 240 hour(s)).   Labs: Basic Metabolic Panel:  Recent Labs Lab 03/02/13 1942 03/02/13 1948 03/04/13 0710  NA 132* 134* 139  K 4.2 4.0 4.2  CL 93* 98 103  CO2 22  --  21  GLUCOSE 415* 408* 234*  BUN 15 15 12   CREATININE 0.57 0.70 0.56  CALCIUM 9.3  --  9.1   Liver Function Tests:  Recent Labs Lab 03/02/13 1942  AST 18  ALT 18  ALKPHOS 98  BILITOT 0.5  PROT 7.5  ALBUMIN 3.5   No results found for this basename: LIPASE, AMYLASE,  in the last 168 hours No results found for this basename: AMMONIA,  in the last 168 hours CBC:  Recent Labs Lab 03/02/13 1942 03/02/13 1948 03/04/13 0710  WBC 9.5  --  7.8  NEUTROABS 6.4  --   --   HGB 15.4* 15.6* 14.4  HCT 41.6 46.0 40.4  MCV 85.4  --  86.3  PLT 224  --  194   Cardiac Enzymes:  Recent Labs Lab 03/02/13 1942  TROPONINI <0.30   BNP: No components found with this basename: POCBNP,  CBG:  Recent Labs Lab 03/03/13 0626 03/03/13 1142 03/03/13 1642 03/03/13 2101 03/04/13 0635  GLUCAP 159* 131* 271* 171* 229*    Time coordinating discharge:  Greater than 30 minutes  Signed:  Hermila Millis, DO [facility] Pager: [phone] 03/04/2013, 12:43 PM

## 2013-03-04 NOTE — Progress Notes (Signed)
Subjective: The patient's daughters at the bedside. The patient states she feels fine and is back to baseline.   Objective: Current vital signs: BP 132/60  Pulse 86  Temp(Src) 98.4 F (36.9 C) (Oral)  Resp 18  Ht 5' 4"  (1.626 m)  Wt 76.93 kg (169 lb 9.6 oz)  BMI 29.10 kg/m2  SpO2 99% Vital signs in last 24 hours: Temp:  [97.9 F (36.6 C)-98.5 F (36.9 C)] 98.4 F (36.9 C) (02/05 0526) Pulse Rate:  [86-99] 86 (02/05 0526) Resp:  [18-20] 18 (02/05 0526) BP: (112-132)/(55-78) 132/60 mmHg (02/05 0526) SpO2:  [95 %-99 %] 99 % (02/05 0526)  Intake/Output from previous day:   Intake/Output this shift:   Nutritional status: Carb Control  Physical Exam  Neurologic Exam:  MENTAL STATUS: awake, alert, Language fluent Follows simple commands. Naming intact   CRANIAL NERVES: pupils equal and reactive to light, visual fields full to confrontation, extraocular muscles intact, no nystagmus, facial sensation and strength symmetric, uvula midline, shoulder shrug symmetric, tongue midline, Corneal reflex,  MOTOR: normal bulk and tone, full strength in the BUE, BLE - Remote fracture left upper extremity with slightly decreased range of motion and mild weakness. SENSORY: normal and symmetric to light touch except mildly decreased sensation left lower extremity secondary to diabetic neuropathy. COORDINATION: finger-nose-finger normal - heel to shin normal - rapid alternating movements normal REFLEXES: deep tendon reflexes present and symmetric - no babinski GAIT/STATION: Not tested   Lab Results: Basic Metabolic Panel:  Recent Labs Lab 03/02/13 1942 03/02/13 1948 03/04/13 0710  NA 132* 134* 139  K 4.2 4.0 4.2  CL 93* 98 103  CO2 22  --  21  GLUCOSE 415* 408* 234*  BUN 15 15 12   CREATININE 0.57 0.70 0.56  CALCIUM 9.3  --  9.1    Liver Function Tests:  Recent Labs Lab 03/02/13 1942  AST 18  ALT 18  ALKPHOS 98  BILITOT 0.5  PROT 7.5  ALBUMIN 3.5   No results found for  this basename: LIPASE, AMYLASE,  in the last 168 hours No results found for this basename: AMMONIA,  in the last 168 hours  CBC:  Recent Labs Lab 03/02/13 1942 03/02/13 1948 03/04/13 0710  WBC 9.5  --  7.8  NEUTROABS 6.4  --   --   HGB 15.4* 15.6* 14.4  HCT 41.6 46.0 40.4  MCV 85.4  --  86.3  PLT 224  --  194    Cardiac Enzymes:  Recent Labs Lab 03/02/13 1942  TROPONINI <0.30    Lipid Panel:  Recent Labs Lab 03/03/13 0431  CHOL 242*  TRIG 283*  HDL 33*  CHOLHDL 7.3  VLDL 57*  LDLCALC 152*    CBG:  Recent Labs Lab 03/03/13 0626 03/03/13 1142 03/03/13 1642 03/03/13 2101 03/04/13 0635  GLUCAP 159* 131* 271* 171* 229*    Microbiology: Results for orders placed during the hospital encounter of 06/04/11  MRSA PCR SCREENING     Status: None   Collection Time    06/04/11  5:28 PM      Result Value Range Status   MRSA by PCR NEGATIVE  NEGATIVE Final   Comment:            The GeneXpert MRSA Assay (FDA     approved for NASAL specimens     only), is one component of a     comprehensive MRSA colonization     surveillance program. It is not     intended  to diagnose MRSA     infection nor to guide or     monitor treatment for     MRSA infections.    Coagulation Studies:  Recent Labs  03/02/13 1942 03/04/13 0710  LABPROT 13.2 13.6  INR 1.02 1.06    2D Echo - ejection fraction 40-45%. No cardiac source of emboli identified.  Imaging:  Dg Chest 2 View 03/02/2013    Interstitial coarsening which could be bronchitic or congestive.   Ct Head (brain) Wo Contrast 03/02/2013    No acute intracranial abnormality.     MRI / MRA Brain Wo Contrast 03/03/2013    No acute infarction. Mild chronic small vessel disease throughout the brain as outlined above.  Occlusion of the right internal carotid artery without antegrade flow at the skullbase. Patent anterior communicating artery with the entire anterior circulation being supplied from the left carotid.  2.5  mm aneurysm projecting posteriorly from the proximal carotid siphon on the left. Similar to the study of 2005.       Medications:  Current Facility-Administered Medications  Medication Dose Route Frequency Provider Last Rate Last Dose  . amLODipine (NORVASC) tablet 5 mg  5 mg Oral Daily Velvet Bathe, MD   5 mg at 03/03/13 1156  . atorvastatin (LIPITOR) tablet 80 mg  80 mg Oral Daily Velvet Bathe, MD   80 mg at 03/03/13 1157  . insulin aspart (novoLOG) injection 0-15 Units  0-15 Units Subcutaneous TID WC Orson Eva, MD   5 Units at 03/04/13 [phone]  . insulin aspart (novoLOG) injection 0-5 Units  0-5 Units Subcutaneous QHS Velvet Bathe, MD   3 Units at 03/02/13 2326  . insulin glargine (LANTUS) injection 15 Units  15 Units Subcutaneous QHS Orson Eva, MD   15 Units at 03/03/13 2154  . Insulin Pen Starter Kit 1 kit  1 kit Other Once Shanon Brow Tat, MD      . isosorbide mononitrate (IMDUR) 24 hr tablet 90 mg  90 mg Oral Daily Velvet Bathe, MD   90 mg at 03/03/13 1156  . losartan (COZAAR) tablet 100 mg  100 mg Oral Daily Velvet Bathe, MD   100 mg at 03/03/13 1158  . niacin CR capsule 1,500 mg  1,500 mg Oral QHS Orson Eva, MD   1,500 mg at 03/03/13 2148  . pantoprazole (PROTONIX) EC tablet 40 mg  40 mg Oral Daily Velvet Bathe, MD   40 mg at 03/03/13 1158  . prasugrel (EFFIENT) tablet 10 mg  10 mg Oral Daily Velvet Bathe, MD   10 mg at 03/03/13 1158  . prochlorperazine (COMPAZINE) injection 10 mg  10 mg Intravenous Q6H PRN Hoy Morn, MD   10 mg at 03/02/13 2052  . senna-docusate (Senokot-S) tablet 1 tablet  1 tablet Oral QHS PRN Velvet Bathe, MD         Assessment/Plan: 63 year old lady who presented with probable complicated migraine headache with neurologic deficit of aphasia resolved at this point and headache markedly improved. TIA is less likely but cannot be completely ruled out. Stroke workup has been unremarkable. No stroke was demonstrated on MRI study. MRA showed occlusion of right internal  carotid which is chronic. Small 2.5 mm aneurysm involving left carotid is also chronic and unchanged. Carotid Doppler study showed no significant stenosis of left internal carotid artery. Echo revealed ejection fraction 40-45% however no cardiac source of emboli was identified.  No further workup is indicated. Continue aspirin 325 mg daily with Lipitor 80 mg daily.  Probable discharge today.   Mikey Bussing PA-C [facility] Pager [phone] 03/04/2013, 9:27 AM

## 2013-03-04 NOTE — Progress Notes (Signed)
Discharge order received. VSS, educated patient about stroke/TIA prevention and diabetes management. Patient sent home with insulin pen starter kit and watched all insulin/ diabetes education videos and voiced understanding of how to use the pen as well as demonstrated knowledge and confidence in performing insulin injections. Educated on new medications and when next doses due. IV removed. Discharged home with daughter via wheelchair.  [name], [name] 2:04 PM 03/04/2013

## 2013-03-04 NOTE — Progress Notes (Signed)
Inpatient Diabetes Program Recommendations  AACE/ADA: New Consensus Statement on Inpatient Glycemic Control (2013)  Target Ranges:  Prepandial:   less than 140 mg/dL      Peak postprandial:   less than 180 mg/dL (1-2 hours)      Critically ill patients:  140 - 180 mg/dL    **Patient for d/c home today.  A1c 13.2%.  Patient needs insulin for d/c per Dr. Carles Collet.  **Spoke to patient about her current A1c of 13.2%.  Explained what an A1c is and what it measures.  Reminded patient that her goal A1c is 7% or less per ADA standards to prevent both acute and long-term complications.  Encouraged patient to check her CBGs at least bid at home (fasting and another check within the day) and to record all CBGs in a logbook for her PCP to review.  Also discussed the fact that patient will be discharged home on insulin.  Patient stated she would rather use insulin pens at home.  Has [employer] insurance coverage.  **Educated patient and daughter on insulin pen use at home.  Reviewed contents of insulin flexpen starter kit.  Reviewed all steps if insulin pen including attachment of needle, 2-unit air shot, dialing up dose, giving injection, removing needle, disposal of sharps, storage of unused insulin, disposal of insulin etc.  Patient able to provide successful return demonstration.  Also reviewed troubleshooting with insulin pen.  MD to give patient Rxs for insulin pens and insulin pen needles.  **Called patient's pharmacy [facility]).  Patient can get Lantus solostar insulin pens for $18 co-pay.  **Patient may do well on Lantus QHS + Metformin.  MD, Please consider sending patient home on Lantus Solostar insulin pen QHS + her previous dose of Metformin.  Would d/c patient's home Amaryl.  Patient to follow up with [facility] as an outpatient.  **Dr. Carles Collet- Please provide patient with the following Rxs at d/c:  1. Lantus Solostar insulin pens 2. Insulin pen needles- 32 gauge X 41m  **Patient relayed that  she is interested in seeing an Endocrinologist after d/c for further DM management.  Assisted patient in making [facility]  Appointment placed on AVS and call made to patient to relay information.  Will follow. JWyn QuakerRN, MSN, CDE Diabetes Coordinator Inpatient Diabetes Program Team Pager: [phone](8a-10p)

## 2013-03-09 NOTE — Progress Notes (Signed)
Physical Therapy Note  Late entry for missed g-codes.     21-Mar-2013 0815  PT Visit Information  Last PT Received On March 21, 2013  PT G-Codes **NOT FOR INPATIENT CLASS**  Functional Assessment Tool Used Clinical Judgement  Functional Limitation Mobility: Walking and moving around  Mobility: Walking and Moving Around Current Status (K1031) CI  Mobility: Walking and Moving Around Goal Status [phone]) CI  Mobility: Walking and Moving Around Discharge Status [phone]) CI   Mack Hook, PT [phone]

## 2013-03-15 ENCOUNTER — Other Ambulatory Visit: Payer: Self-pay | Admitting: Cardiovascular Disease

## 2013-03-16 ENCOUNTER — Ambulatory Visit: Payer: 59 | Admitting: Internal Medicine

## 2013-03-18 ENCOUNTER — Other Ambulatory Visit: Payer: Self-pay | Admitting: *Deleted

## 2013-03-18 MED ORDER — PANTOPRAZOLE SODIUM 40 MG PO TBEC: 40.0000 mg | DELAYED_RELEASE_TABLET | Freq: Every day | ORAL | Status: DC

## 2013-03-18 NOTE — Telephone Encounter (Signed)
Rx was sent to pharmacy electronically. 

## 2013-04-12 ENCOUNTER — Other Ambulatory Visit: Payer: Self-pay | Admitting: Cardiovascular Disease

## 2013-04-12 NOTE — Telephone Encounter (Signed)
Rx was sent to pharmacy electronically. 

## 2013-04-13 ENCOUNTER — Ambulatory Visit: Payer: 59 | Admitting: Cardiovascular Disease

## 2013-04-14 ENCOUNTER — Ambulatory Visit: Payer: 59 | Admitting: Dietician

## 2013-05-08 ENCOUNTER — Other Ambulatory Visit: Payer: Self-pay | Admitting: Cardiovascular Disease

## 2013-05-11 ENCOUNTER — Ambulatory Visit: Payer: 59 | Admitting: Cardiovascular Disease

## 2013-05-12 ENCOUNTER — Other Ambulatory Visit: Payer: Self-pay | Admitting: *Deleted

## 2013-05-24 ENCOUNTER — Ambulatory Visit: Payer: 59 | Admitting: *Deleted

## 2013-05-29 ENCOUNTER — Other Ambulatory Visit: Payer: Self-pay | Admitting: Cardiovascular Disease

## 2013-06-01 ENCOUNTER — Other Ambulatory Visit: Payer: Self-pay | Admitting: Cardiovascular Disease

## 2013-06-02 NOTE — Telephone Encounter (Signed)
Rx was sent to pharmacy electronically. 

## 2013-06-11 ENCOUNTER — Other Ambulatory Visit: Payer: Self-pay | Admitting: Cardiovascular Disease

## 2013-06-11 MED ORDER — PRASUGREL HCL 10 MG PO TABS: 10.0000 mg | ORAL_TABLET | Freq: Every day | ORAL | Status: DC

## 2013-06-11 NOTE — Telephone Encounter (Signed)
Patient has not been seen since 09/17/2011, left VM with patient to make and keep appointment, patient has made two appointments in the past until Rx is refilled then canceled appointments.  Rx refill of #10 with 0 refills sent to patient pharmacy

## 2013-06-12 ENCOUNTER — Other Ambulatory Visit: Payer: Self-pay | Admitting: Cardiovascular Disease

## 2013-06-14 NOTE — Telephone Encounter (Signed)
Rx was sent to pharmacy electronically. 

## 2013-06-17 ENCOUNTER — Telehealth: Payer: Self-pay | Admitting: Cardiovascular Disease

## 2013-06-17 MED ORDER — NIACIN ER (ANTIHYPERLIPIDEMIC) 750 MG PO TBCR: 1500.0000 mg | EXTENDED_RELEASE_TABLET | Freq: Every day | ORAL | Status: DC

## 2013-06-17 MED ORDER — LOSARTAN POTASSIUM 100 MG PO TABS: 100.0000 mg | ORAL_TABLET | Freq: Every day | ORAL | Status: DC

## 2013-06-17 MED ORDER — PRASUGREL HCL 10 MG PO TABS: 10.0000 mg | ORAL_TABLET | Freq: Every day | ORAL | Status: DC

## 2013-06-17 MED ORDER — AMLODIPINE BESYLATE 5 MG PO TABS: 5.0000 mg | ORAL_TABLET | Freq: Every day | ORAL | Status: DC

## 2013-06-17 MED ORDER — ATORVASTATIN CALCIUM 80 MG PO TABS: 80.0000 mg | ORAL_TABLET | Freq: Every day | ORAL | Status: DC

## 2013-06-17 MED ORDER — PANTOPRAZOLE SODIUM 40 MG PO TBEC: 40.0000 mg | DELAYED_RELEASE_TABLET | Freq: Every day | ORAL | Status: DC

## 2013-06-17 NOTE — Telephone Encounter (Signed)
Per Nada Boozer... Please refill this patient medications until her next appointment .... Thanks

## 2013-06-17 NOTE — Telephone Encounter (Signed)
Refills for all cardiac medications sent to pharmacy electronically.

## 2013-06-18 ENCOUNTER — Encounter: Payer: Self-pay | Admitting: Cardiovascular Disease

## 2013-06-18 ENCOUNTER — Ambulatory Visit: Payer: 59 | Admitting: Cardiology

## 2013-06-28 ENCOUNTER — Ambulatory Visit (INDEPENDENT_AMBULATORY_CARE_PROVIDER_SITE_OTHER): Payer: 59 | Admitting: Cardiology

## 2013-06-28 ENCOUNTER — Encounter: Payer: Self-pay | Admitting: Cardiology

## 2013-06-28 VITALS — BP 132/64 | HR 89 | Ht 64.0 in | Wt 170.9 lb

## 2013-06-28 DIAGNOSIS — IMO0002 Reserved for concepts with insufficient information to code with codable children: Secondary | ICD-10-CM

## 2013-06-28 DIAGNOSIS — E785 Hyperlipidemia, unspecified: Secondary | ICD-10-CM

## 2013-06-28 DIAGNOSIS — I2510 Atherosclerotic heart disease of native coronary artery without angina pectoris: Secondary | ICD-10-CM

## 2013-06-28 DIAGNOSIS — IMO0001 Reserved for inherently not codable concepts without codable children: Secondary | ICD-10-CM

## 2013-06-28 DIAGNOSIS — I6529 Occlusion and stenosis of unspecified carotid artery: Secondary | ICD-10-CM

## 2013-06-28 DIAGNOSIS — I739 Peripheral vascular disease, unspecified: Secondary | ICD-10-CM

## 2013-06-28 DIAGNOSIS — E119 Type 2 diabetes mellitus without complications: Secondary | ICD-10-CM

## 2013-06-28 DIAGNOSIS — E1165 Type 2 diabetes mellitus with hyperglycemia: Secondary | ICD-10-CM

## 2013-06-28 HISTORY — DX: Hyperlipidemia, unspecified: E78.5

## 2013-06-28 MED ORDER — PANTOPRAZOLE SODIUM 40 MG PO TBEC
40.0000 mg | DELAYED_RELEASE_TABLET | Freq: Every day | ORAL | Status: DC
Start: 2013-06-28 — End: 2014-02-25

## 2013-06-28 MED ORDER — AMLODIPINE BESYLATE 5 MG PO TABS: 5.0000 mg | ORAL_TABLET | Freq: Every day | ORAL | Status: DC

## 2013-06-28 MED ORDER — EZETIMIBE 10 MG PO TABS: 10.0000 mg | ORAL_TABLET | Freq: Every day | ORAL | Status: DC

## 2013-06-28 MED ORDER — ISOSORBIDE MONONITRATE ER 60 MG PO TB24: ORAL_TABLET | ORAL | Status: DC

## 2013-06-28 MED ORDER — LOSARTAN POTASSIUM 100 MG PO TABS: 100.0000 mg | ORAL_TABLET | Freq: Every day | ORAL | Status: DC

## 2013-06-28 MED ORDER — PRASUGREL HCL 10 MG PO TABS: 10.0000 mg | ORAL_TABLET | Freq: Every day | ORAL | Status: DC

## 2013-06-28 MED ORDER — ATORVASTATIN CALCIUM 80 MG PO TABS
80.0000 mg | ORAL_TABLET | Freq: Every day | ORAL | Status: DC
Start: 2013-06-28 — End: 2014-07-15

## 2013-06-28 NOTE — Assessment & Plan Note (Signed)
Carotid disease with recent carotid Dopplers with migraine syndrome, total RCIA and 0-39% left carotid internal artery disease.

## 2013-06-28 NOTE — Assessment & Plan Note (Signed)
Has not seen Dr. Allyson Sabal in some time, she does have claudication symptoms but no worse than it was the last time she saw him.

## 2013-06-28 NOTE — Patient Instructions (Addendum)
Have lipid and hepatic lab work done in 6 weeks after starting zetia.    Follow up with Dr. Tresa Endo in 6 months unless problems prior to that time.

## 2013-06-28 NOTE — Assessment & Plan Note (Signed)
In 02/2013 total cholesterol 242 triglycerides 283 HDL 33 LDL of 152 and that was taking Lipitor 80 mg daily.  I am adding Zetia today and recheck hepatic and lipid panel in 6 weeks.

## 2013-06-28 NOTE — Assessment & Plan Note (Signed)
Followed by primary care.   

## 2013-06-28 NOTE — Progress Notes (Signed)
06/28/2013   PCP: Georgann Housekeeper, MD   Chief Complaint  Patient presents with  . Annual Exam    C/o leg pain-contributed to neuropathy, otherwise, no complaints.    Primary Cardiologist: Dr. Bishop Limbo   HPI:  63 year old female presents to the office today for followup for her coronary artery disease and refill of medications.  She has coronary artery disease as well as peripheral vascular disease. September 2011 she had ST segment The left circumflex myocardial infarction underwent intervention to a telemetry occluded proximal circumflex ultimately stented with a premised drug-eluting stent. She had diffuse disease in her circumflex beyond the site of the total occlusion involving multiple bifurcation branches and underwent angioplasty of her smaller vessels.    She also has has significant peripheral vascular disease has undergone angioplasty and stenting with a long went down back rotational atherectomy of the right popliteal and SFA in January 2012 and required repeat intervention for diffuse in-stent restenosis on her most recent angiogram in May of 2013 Chaddock lid SFAs bilaterally and patent iliac stent on the right 80% calcified distal left common femoral artery stenosis it was felt this could not be addressed percutaneously and she would require bilateral femoropopliteal bypass grafting if her symptoms worsened.  Also has a total right internal carotid stenosis and a small amount of fibrous plaque in the left. Carotid Dopplers were just done in February of this year, and begin entirely excluded RCA left with 1-39% stenosis.  2-D echo was also done during hospitalization EF remains 40-45% there is akinesis of the basal inferior posterior myocardium she had grade 1 diastolic dysfunction mitral valve with calcified annulus.  She has not seen Dr. Tresa Endo since 2013. Recently she was hospitalized for migraine lipid panel was done revealing total cholesterol 242 triglycerides 283  HDL 33 LDL of 152 and that was taking Lipitor 80 mg daily.  Her hemoglobin A1c was 13 at that time as well.  It looks like niacin was to be added but patient has not started taking.  We did discuss her elevated cholesterol levels and I am inclined to add zetia and hold the niacin for now.  Her diabetes was poorly controlled she has been managing this better and she believes her last hemoglobin A1c was 8 or 9 but improved from 13.   Allergies  Allergen Reactions  . Hydrochlorothiazide   . Latex Rash  . Penicillins Swelling and Rash    Current Outpatient Prescriptions  Medication Sig Dispense Refill  . amLODipine (NORVASC) 5 MG tablet Take 1 tablet (5 mg total) by mouth daily.  30 tablet  6  . aspirin EC 81 MG tablet Take 81 mg by mouth daily.      Marland Kitchen atorvastatin (LIPITOR) 80 MG tablet Take 1 tablet (80 mg total) by mouth daily.  30 tablet  6  . Insulin Glargine (LANTUS) 100 UNIT/ML Solostar Pen Inject 30 Units into the skin daily at 10 pm.      . isosorbide mononitrate (IMDUR) 60 MG 24 hr tablet Take 1.5 tablets by mouth once daily. <please make appointment>  45 tablet  6  . losartan (COZAAR) 100 MG tablet Take 1 tablet (100 mg total) by mouth daily.  30 tablet  6  . metFORMIN (GLUCOPHAGE) 1000 MG tablet Take 1,000 mg by mouth 2 (two) times daily with a meal.      . pantoprazole (PROTONIX) 40 MG tablet Take 1 tablet (40 mg total) by mouth daily.  30 tablet  6  . prasugrel (EFFIENT) 10 MG TABS tablet Take 1 tablet (10 mg total) by mouth daily.  30 tablet  6  . ezetimibe (ZETIA) 10 MG tablet Take 1 tablet (10 mg total) by mouth daily.  35 tablet  0   No current facility-administered medications for this visit.    Past Medical History  Diagnosis Date  . Peripheral vascular disease   . Diabetes mellitus   . Coronary artery disease   . Myocardial infarction   . Hypertension   . Complication of anesthesia     DIFFICULT WAKING  . Headache(784.0)   . Neuromuscular disorder     DIABETIC  NEUROPATHY  . Arthritis   . Hyperlipidemia LDL goal < 70 06/28/2013    Past Surgical History  Procedure Laterality Date  . Coronary angioplasty    . Abdominal hysterectomy    . Peripheral vascular angiogram  01/26/2010    High-grade SFA disease: left greater than right. Left SFA would require fem-pop bypass grafting. Right SFA could be stented but might require Diamondback Orbital atherectomy.  . Peripheral vascular angiogram  02/23/2010    Stealth Predator orbital rotational atherectomy performed on SFA & Popliteal up to 90,000 RPM. Stenting using overlapping 5x16600mm and 5x1160mm Absolute Pro Nitinol self-expanding stents beginning just at the knee up to the mid SFA resulting in reduction of 90-95% calcified SFA & Popliteal stenosis to 0. Stenting performed on the distal common & proximal iliac artery with a 10x4 Absolute Pro- 70-0%.  . Peripheral vascular angiogram  06/17/2010    PTA performed to the right external iliac artery stent using a 5x100 balloon at 10 atmospheres. Stenting performed using a 6x18 Genesis on Opta balloon. Postdilatation with a 7x2 balloon resulting in a 95% "in-stent" stenosis to 0% residual.  . Peripheral vascular angiogram  06/04/2011    Bilateral total SFAs not percutaneously addressable. Good canidate for femoropopliteal bypass grafting  . Carotid duplex  03/19/2011    Right ICA-demonstrates complete occlusion. Left ICA-demonstrates a small amount of fibrous plaque.  . Cardiac catheterization  10/13/2009    95% stenosis in the AV groove circumflex and 95% ostial stenosis in small OM3. A 3x6628mm drug-eluting Promus stent inserted ito the circumflex. Dilatated with a 3.25x2812mm noncompliant Quantum balloon within entire segment. The entire region was reduced to 0% and brisk TIMI3 flow.  Eugenie Birks. Lexiscan myoview  10/25/2010    Moderate perfusion defect due to infarct/scar with mild perinfarct ischemia seen in the Basal Inferolateral, Basal Anterolateral, Mid Inferolateral, and Mid  Anterolateral regions. Post-stress EF is 50%.  . Transthoracic echocardiogram  10/17/2009    EF 45-50%, moderate hypokinesis of the entire inferolateral myocardium, mild concentric hypertrophy and mild regurg of the mitral valva.    ZOX:WRUEAVW:UJROS:General:no colds or fevers, no weight changes Skin:no rashes or ulcers HEENT:no blurred vision, no congestion CV:see HPI PUL:see HPI GI:no diarrhea constipation or melena, no indigestion GU:no hematuria, no dysuria MS:no joint pain, + claudication with hip pain she also has neuropathy she has burning of her feet as well. She can only walk a couple blocks before she has to stop. At this time she's not interested in pursuing a femoropopliteal bypass.  Neuro:no syncope, no lightheadedness Endo:+  Diabetes had been uncontrolled but is currently improving, no thyroid disease  PHYSICAL EXAM BP 132/64  Pulse 89  Ht 5\' 4"  (1.626 m)  Wt 170 lb 14.4 oz (77.52 kg)  BMI 29.32 kg/m2 General:Pleasant affect, NAD Skin:Warm and dry, brisk capillary refill HEENT:normocephalic,  sclera clear, mucus membranes moist Neck:supple, no JVD, + carotid bruits bil Heart:S1S2 RRR with 2/6 systolic murmur, gallup, rub or click Lungs:clear without rales, rhonchi, or wheezes ZOX:WRUE, non tender, + BS, do not palpate liver spleen or masses Ext:no lower ext edema, 2+ pedal pulses, 2+ radial pulses Neuro:alert and oriented, MAE, follows commands, + facial symmetry  EKG:SR no acute changes from previous.  ASSESSMENT AND PLAN CAD (coronary artery disease): DES to dominant Circ 10/13/09 No chest pain, some dyspnea on exertion.  Continue current medications including effient and as she does have diffuse disease in the circumflex.  She will follow with Dr. Tresa Endo in 6 months unless she has problems prior to that time.  Hyperlipidemia LDL goal < 70 In 02/2013 total cholesterol 242 triglycerides 283 HDL 33 LDL of 152 and that was taking Lipitor 80 mg daily.  I am adding Zetia today and  recheck hepatic and lipid panel in 6 weeks.  Diabetes mellitus type II, uncontrolled Followed by primary care  PAD (peripheral artery disease) Carotid disease with recent carotid Dopplers with migraine syndrome, total RCIA and 0-39% left carotid internal artery disease.    Claudication in peripheral vascular disease:  Lifestyle limiting. Has not seen Dr. Allyson Sabal in some time, she does have claudication symptoms but no worse than it was the last time she saw him.

## 2013-06-28 NOTE — Assessment & Plan Note (Addendum)
No chest pain, some dyspnea on exertion.  Continue current medications including effient and as she does have diffuse disease in the circumflex.  She will follow with Dr. Tresa Endo in 6 months unless she has problems prior to that time.

## 2013-07-06 ENCOUNTER — Ambulatory Visit: Payer: 59 | Admitting: *Deleted

## 2013-07-08 ENCOUNTER — Encounter (HOSPITAL_COMMUNITY): Payer: 59

## 2013-08-25 ENCOUNTER — Ambulatory Visit: Payer: 59 | Admitting: *Deleted

## 2013-10-13 LAB — LIPID PANEL
CHOLESTEROL: 106 mg/dL (ref 0–200)
HDL: 33 mg/dL — ABNORMAL LOW (ref >39)
LDL Cholesterol: 41 mg/dL (ref 0–99)
Total CHOL/HDL Ratio: 3.2 Ratio
Triglycerides: 160 mg/dL — ABNORMAL HIGH (ref <150)
VLDL: 32 mg/dL (ref 0–40)

## 2013-10-13 LAB — HEPATIC FUNCTION PANEL
ALT: 20 U/L (ref 0–35)
AST: 19 U/L (ref 0–37)
Albumin: 4.2 g/dL (ref 3.5–5.2)
Alkaline Phosphatase: 77 U/L (ref 39–117)
BILIRUBIN INDIRECT: 0.4 mg/dL (ref 0.2–1.2)
Bilirubin, Direct: 0.1 mg/dL (ref 0.0–0.3)
Total Bilirubin: 0.5 mg/dL (ref 0.2–1.2)
Total Protein: 7.2 g/dL (ref 6.0–8.3)

## 2013-10-14 ENCOUNTER — Other Ambulatory Visit: Payer: Self-pay | Admitting: Internal Medicine

## 2013-10-14 DIAGNOSIS — Z1231 Encounter for screening mammogram for malignant neoplasm of breast: Secondary | ICD-10-CM

## 2013-10-18 ENCOUNTER — Encounter: Payer: Self-pay | Admitting: *Deleted

## 2013-10-29 ENCOUNTER — Ambulatory Visit: Payer: 59

## 2013-11-09 ENCOUNTER — Ambulatory Visit: Payer: 59

## 2014-01-06 ENCOUNTER — Encounter (HOSPITAL_COMMUNITY): Payer: Self-pay | Admitting: Cardiovascular Disease

## 2014-02-24 ENCOUNTER — Other Ambulatory Visit: Payer: Self-pay | Admitting: Cardiology

## 2014-02-24 NOTE — Telephone Encounter (Signed)
Rx has been sent to the pharmacy electronically. ° °

## 2014-02-25 ENCOUNTER — Other Ambulatory Visit: Payer: Self-pay | Admitting: Cardiology

## 2014-02-25 NOTE — Telephone Encounter (Signed)
Rx(s) sent to pharmacy electronically. Message sent to [facility], Dr. Landry Dyke scheduler, to contact patient for an appointment

## 2014-03-02 ENCOUNTER — Telehealth: Payer: Self-pay | Admitting: Cardiovascular Disease

## 2014-03-07 ENCOUNTER — Other Ambulatory Visit: Payer: Self-pay | Admitting: Cardiovascular Disease

## 2014-03-07 NOTE — Telephone Encounter (Signed)
Rx(s) sent to pharmacy electronically. OV 05/02/14 

## 2014-03-09 NOTE — Telephone Encounter (Signed)
Closed encounter °

## 2014-03-20 ENCOUNTER — Other Ambulatory Visit: Payer: Self-pay | Admitting: Cardiovascular Disease

## 2014-03-20 ENCOUNTER — Other Ambulatory Visit: Payer: Self-pay | Admitting: Cardiology

## 2014-03-21 NOTE — Telephone Encounter (Signed)
Rx(s) sent to pharmacy electronically.  

## 2014-04-15 ENCOUNTER — Encounter: Payer: Self-pay | Admitting: Podiatrist

## 2014-04-15 ENCOUNTER — Ambulatory Visit (INDEPENDENT_AMBULATORY_CARE_PROVIDER_SITE_OTHER): Payer: 59 | Admitting: Podiatrist

## 2014-04-15 VITALS — BP 130/70 | HR 98 | Resp 12

## 2014-04-15 DIAGNOSIS — B351 Tinea unguium: Secondary | ICD-10-CM | POA: Diagnosis not present

## 2014-04-15 DIAGNOSIS — I739 Peripheral vascular disease, unspecified: Secondary | ICD-10-CM | POA: Diagnosis not present

## 2014-04-15 DIAGNOSIS — E1140 Type 2 diabetes mellitus with diabetic neuropathy, unspecified: Secondary | ICD-10-CM | POA: Diagnosis not present

## 2014-04-15 DIAGNOSIS — M79609 Pain in unspecified limb: Secondary | ICD-10-CM

## 2014-04-15 NOTE — Progress Notes (Signed)
   Subjective:    Patient ID: [name] [name], female    DOB: 04-23-1950, 64 y.o.   MRN: [id number]  HPI  Patient presents today for a diabetic foot check and for a toenail debridement.  She states she has decreased sensation and decreased circulation to both feet.  She has had multiple stents placed in both legs and states that she knows her circulation is bad.  She complains of ischemic foot pain at night that requires her to get out of bed and walk around to alleviate the pain.  She also has tingling and numbness in her left foot greater than right foot.   Review of Systems  Cardiovascular: Positive for leg swelling.  Neurological: Positive for numbness.  All other systems reviewed and are negative.      Objective:   Physical Exam Pedal pulses non palpable dp or pt bilateral. Capillary refill time is increased to distal digits bilateral.  Temperature is cool to cool,  Rubor on dependency is noted bilateral.  Neurological sensation decreased on the left greater than right foot.  Sensation intact at 1/5 sites left and 2/5 sites right,  Vibratory sensation is absent bilateral.  Toenails are mildly elongated, thick, discolored, incurvated x 10.  No open lesions present, no pre ulcerative lesions present.  Rectus foot type with intact intrinsic musculature noted.      Assessment & Plan:  Diabetes with pvd, neuropathy, symptomatic toenails  Plan:  Careful debridement carried out on the toenail.  Discussed the improtance for her to be very careful with her feet as she is at a higher risk for foot related ulcerations and complications due to pvd and neuropathy.  Recommended routine follow ups every 3 months.  She is following up with her cardiologist for her stents as she routinely does every 6 months.

## 2014-04-15 NOTE — Patient Instructions (Signed)
Diabetes and Foot Care Diabetes may cause you to have problems because of poor blood supply (circulation) to your feet and legs. This may cause the skin on your feet to become thinner, break easier, and heal more slowly. Your skin may become dry, and the skin may peel and crack. You may also have nerve damage in your legs and feet causing decreased feeling in them. You may not notice minor injuries to your feet that could lead to infections or more serious problems. Taking care of your feet is one of the most important things you can do for yourself.  HOME CARE INSTRUCTIONS  Wear shoes at all times, even in the house. Do not go barefoot. Bare feet are easily injured.  Check your feet daily for blisters, cuts, and redness. If you cannot see the bottom of your feet, use a mirror or ask someone for help.  Wash your feet with warm water (do not use hot water) and mild soap. Then pat your feet and the areas between your toes until they are completely dry. Do not soak your feet as this can dry your skin.  Apply a moisturizing lotion or petroleum jelly (that does not contain alcohol and is unscented) to the skin on your feet and to dry, brittle toenails. Do not apply lotion between your toes.  Trim your toenails straight across. Do not dig under them or around the cuticle. File the edges of your nails with an emery board or nail file.  Do not cut corns or calluses or try to remove them with medicine.  Wear clean socks or stockings every day. Make sure they are not too tight. Do not wear knee-high stockings since they may decrease blood flow to your legs.  Wear shoes that fit properly and have enough cushioning. To break in new shoes, wear them for just a few hours a day. This prevents you from injuring your feet. Always look in your shoes before you put them on to be sure there are no objects inside.  Do not cross your legs. This may decrease the blood flow to your feet.  If you find a minor scrape,  cut, or break in the skin on your feet, keep it and the skin around it clean and dry. These areas may be cleansed with mild soap and water. Do not cleanse the area with peroxide, alcohol, or iodine.  When you remove an adhesive bandage, be sure not to damage the skin around it.  If you have a wound, look at it several times a day to make sure it is healing.  Do not use heating pads or hot water bottles. They may burn your skin. If you have lost feeling in your feet or legs, you may not know it is happening until it is too late.  Make sure your health care provider performs a complete foot exam at least annually or more often if you have foot problems. Report any cuts, sores, or bruises to your health care provider immediately. SEEK MEDICAL CARE IF:   You have an injury that is not healing.  You have cuts or breaks in the skin.  You have an ingrown nail.  You notice redness on your legs or feet.  You feel burning or tingling in your legs or feet.  You have pain or cramps in your legs and feet.  Your legs or feet are numb.  Your feet always feel cold. SEEK IMMEDIATE MEDICAL CARE IF:   There is increasing redness,   swelling, or pain in or around a wound.  There is a red line that goes up your leg.  Pus is coming from a wound.  You develop a fever or as directed by your health care provider.  You notice a bad smell coming from an ulcer or wound. Document Released: 01/12/2000 Document Revised: 09/16/2012 Document Reviewed: 06/23/2012 Exit[employer] Patient Information 2015 [employer]. This information is not intended to replace advice given to you by your health care provider. Make sure you discuss any questions you have with your health care provider.  

## 2014-05-02 ENCOUNTER — Ambulatory Visit: Payer: Self-pay | Admitting: Cardiovascular Disease

## 2014-05-11 ENCOUNTER — Other Ambulatory Visit: Payer: Self-pay | Admitting: Cardiovascular Disease

## 2014-05-12 NOTE — Telephone Encounter (Signed)
E-SENT TO PHARMACY   NEEDS APPOINTMENT

## 2014-07-02 ENCOUNTER — Other Ambulatory Visit: Payer: Self-pay | Admitting: Cardiology

## 2014-07-04 NOTE — Telephone Encounter (Signed)
Rx has been sent to the pharmacy electronically. ° °

## 2014-07-15 ENCOUNTER — Other Ambulatory Visit: Payer: Self-pay | Admitting: Cardiology

## 2014-07-15 NOTE — Telephone Encounter (Signed)
Rx(s) sent to pharmacy electronically.  

## 2014-07-22 ENCOUNTER — Ambulatory Visit (INDEPENDENT_AMBULATORY_CARE_PROVIDER_SITE_OTHER): Payer: 59 | Admitting: Podiatry

## 2014-07-22 DIAGNOSIS — M79676 Pain in unspecified toe(s): Secondary | ICD-10-CM

## 2014-07-22 DIAGNOSIS — I739 Peripheral vascular disease, unspecified: Secondary | ICD-10-CM

## 2014-07-22 DIAGNOSIS — B351 Tinea unguium: Secondary | ICD-10-CM

## 2014-07-22 DIAGNOSIS — E1140 Type 2 diabetes mellitus with diabetic neuropathy, unspecified: Secondary | ICD-10-CM | POA: Diagnosis not present

## 2014-07-23 NOTE — Progress Notes (Signed)
Patient ID: [name] [name], female   DOB: [birth year], 64 y.o.   MRN: [id number]  Subjective: 64 y.o.-year-old female returns the office today for painful, elongated, thickened toenails. Denies any redness or drainage around the nails. Denies any acute changes since last appointment and no new complaints today. Denies any systemic complaints such as fevers, chills, nausea, vomiting. She follows with Dr. Allyson Sabal for this.   Objective: AAO 3, NAD DP/PT pulses decreased, CRT less than 3 seconds Protective sensation decreased with Simms Weinstein monofilament Nails hypertrophic, dystrophic, elongated, brittle, discolored 10. There is tenderness overlying the nails 1-5 bilaterally. There is no surrounding erythema or drainage along the nail sites. No open lesions or pre-ulcerative lesions are identified. No other areas of tenderness bilateral lower extremities. No overlying edema, erythema, increased warmth. No pain with calf compression, swelling, warmth, erythema.  Assessment: Patient presents with symptomatic onychomycosis; DM with neuropathy/angiopathy   Plan: -Treatment options including alternatives, risks, complications were discussed -Nails sharply debrided 10 without complication/bleeding. -Discussed daily foot inspection. If there are any changes, to call the office immediately.  -Follow-up in 3 months or sooner if any problems are to arise. In the meantime, encouraged to call the office with any questions, concerns, changes symptoms.

## 2014-07-30 ENCOUNTER — Other Ambulatory Visit: Payer: Self-pay | Admitting: Cardiovascular Disease

## 2014-08-02 NOTE — Telephone Encounter (Signed)
Rx(s) sent to pharmacy electronically.  

## 2014-09-05 ENCOUNTER — Other Ambulatory Visit: Payer: Self-pay | Admitting: Cardiovascular Disease

## 2014-09-05 NOTE — Telephone Encounter (Signed)
REFILL 

## 2014-10-13 ENCOUNTER — Encounter: Payer: Self-pay | Admitting: Cardiovascular Disease

## 2014-10-13 ENCOUNTER — Ambulatory Visit (INDEPENDENT_AMBULATORY_CARE_PROVIDER_SITE_OTHER): Payer: Commercial Managed Care - HMO | Admitting: Cardiovascular Disease

## 2014-10-13 VITALS — BP 150/70 | HR 89 | Ht 64.0 in | Wt 172.1 lb

## 2014-10-13 DIAGNOSIS — I25810 Atherosclerosis of coronary artery bypass graft(s) without angina pectoris: Secondary | ICD-10-CM | POA: Diagnosis not present

## 2014-10-13 DIAGNOSIS — E785 Hyperlipidemia, unspecified: Secondary | ICD-10-CM | POA: Diagnosis not present

## 2014-10-13 DIAGNOSIS — Z79899 Other long term (current) drug therapy: Secondary | ICD-10-CM | POA: Diagnosis not present

## 2014-10-13 DIAGNOSIS — IMO0002 Reserved for concepts with insufficient information to code with codable children: Secondary | ICD-10-CM

## 2014-10-13 DIAGNOSIS — I739 Peripheral vascular disease, unspecified: Secondary | ICD-10-CM

## 2014-10-13 DIAGNOSIS — R0989 Other specified symptoms and signs involving the circulatory and respiratory systems: Secondary | ICD-10-CM

## 2014-10-13 DIAGNOSIS — R011 Cardiac murmur, unspecified: Secondary | ICD-10-CM

## 2014-10-13 DIAGNOSIS — E1165 Type 2 diabetes mellitus with hyperglycemia: Secondary | ICD-10-CM

## 2014-10-13 MED ORDER — METOPROLOL SUCCINATE ER 50 MG PO TB24: ORAL_TABLET | ORAL | Status: DC

## 2014-10-13 NOTE — Patient Instructions (Signed)
Your physician has recommended you make the following change in your medication: start new prescription for metoprolol succ 50 mg as directed on the bottle. This has already been sent to your pharmacy.  Your physician recommends that you return for lab work fasting.  Your physician has requested that you have a carotid duplex. This test is an ultrasound of the carotid arteries in your neck. It looks at blood flow through these arteries that supply the brain with blood. Allow one hour for this exam. There are no restrictions or special instructions.  Your physician has requested that you have an echocardiogram. Echocardiography is a painless test that uses sound waves to create images of your heart. It provides your doctor with information about the size and shape of your heart and how well your heart's chambers and valves are working. This procedure takes approximately one hour. There are no restrictions for this procedure.  Your physician has requested that you have a lower  extremity arterial duplex. This test is an ultrasound of the arteries in the legs or arms. It looks at arterial blood flow in the legs and arms. Allow one hour for Lower and Upper Arterial scans. There are no restrictions or special instructions  Your physician recommends that you schedule a follow-up appointment in: 2 months with Dr. Tresa Endo.

## 2014-10-15 ENCOUNTER — Encounter: Payer: Self-pay | Admitting: Cardiovascular Disease

## 2014-10-15 NOTE — Progress Notes (Signed)
Patient ID: [name] [name], female   DOB: [birth year], 64 y.o.   MRN: [id number]     HPI: [name] [name] is a 64 y.o. female who presents to the office today for a cardiology evaluation.  I have not seen her in over 3 years.  [name] [name] has a history of CAD as well as PVD.  In September 2011 she suffered an ST segment elevation myocardial infarction and underwent intervention to a totally occluded proximal left circumflex coronary artery which was successfully stented with a DES stent.  There was diffuse disease in her circumflex beyond the site of total occlusion and involving multiple bifurcation branches for which she underwent PTCA of the smaller vessels.  She has significant PVD and underwent angioplasty and stenting with diamondback rotational atherectomy for right popliteal and SFA in January 2012 and required repeat intervention in 2013 by Dr. Gwenlyn Found.  She has documented carotid stenosis with total right internal carotid occlusion and small amount of fibrous plaque in the left.  An echo Doppler study showed an EF in the 40-45% range with akinesis of basal inferior posterior myocardium with grade 1 diastolic dysfunction and mitral valvular annular calcification.  She was seen by Cecilie Kicks in June 2015.  At that time she was having shortness of breath with exertion but no chest pain.  She has hyperlipidemia with cholesterols in the past.  There continues to be high despite lipid-lowering therapy with Lipitor 80 mg and Zetia was added to her medical regimen.  She has type 2 diabetes not well controlled.  She continues to have chronic claudication symptoms but this has been fairly stable and not progressive.  She presents now for evaluation.  She has difficulty walking and now walks with a cane.  She has significant peripheral neuropathy.  She's had diabetes mellitus for at least 14 years.  She notes swelling of her ankles, left greater than right.  She denies PND, orthopnea.  She is unaware of  palpitations.    Past Medical History  Diagnosis Date  . Peripheral vascular disease   . Diabetes mellitus   . Coronary artery disease   . Myocardial infarction   . Hypertension   . Complication of anesthesia     DIFFICULT WAKING  . Headache(784.0)   . Neuromuscular disorder     DIABETIC NEUROPATHY  . Arthritis   . Hyperlipidemia LDL goal < 70 06/28/2013    Past Surgical History  Procedure Laterality Date  . Coronary angioplasty    . Abdominal hysterectomy    . Peripheral vascular angiogram  01/26/2010    High-grade SFA disease: left greater than right. Left SFA would require fem-pop bypass grafting. Right SFA could be stented but might require Diamondback Orbital atherectomy.  . Peripheral vascular angiogram  02/23/2010    Stealth Predator orbital rotational atherectomy performed on SFA & Popliteal up to 90,000 RPM. Stenting using overlapping 5x153m and 5x625mAbsolute Pro Nitinol self-expanding stents beginning just at the knee up to the mid SFA resulting in reduction of 90-95% calcified SFA & Popliteal stenosis to 0. Stenting performed on the distal common & proximal iliac artery with a 10x4 Absolute Pro- 70-0%.  . Peripheral vascular angiogram  06/17/2010    PTA performed to the right external iliac artery stent using a 5x100 balloon at 10 atmospheres. Stenting performed using a 6x18 Genesis on Opta balloon. Postdilatation with a 7x2 balloon resulting in a 95% "in-stent" stenosis to 0% residual.  . Peripheral vascular angiogram  06/04/2011  Bilateral total SFAs not percutaneously addressable. Good canidate for femoropopliteal bypass grafting  . Carotid duplex  03/19/2011    Right ICA-demonstrates complete occlusion. Left ICA-demonstrates a small amount of fibrous plaque.  . Cardiac catheterization  10/13/2009    95% stenosis in the AV groove circumflex and 95% ostial stenosis in small OM3. A 3x59m drug-eluting Promus stent inserted ito the circumflex. Dilatated with a 3.25x118m noncompliant Quantum balloon within entire segment. The entire region was reduced to 0% and brisk TIMI3 flow.  . Carlton Adamyoview  10/25/2010    Moderate perfusion defect due to infarct/scar with mild perinfarct ischemia seen in the Basal Inferolateral, Basal Anterolateral, Mid Inferolateral, and Mid Anterolateral regions. Post-stress EF is 50%.  . Transthoracic echocardiogram  10/17/2009    EF 45-50%, moderate hypokinesis of the entire inferolateral myocardium, mild concentric hypertrophy and mild regurg of the mitral valva.  . Atherectomy N/A 06/04/2011    Procedure: ATHERECTOMY;  Surgeon: JoLorretta HarpMD;  [facility];  Service: Cardiovascular;  Laterality: N/A;    Allergies  Allergen Reactions  . Hydrochlorothiazide   . Latex Rash  . Penicillins Swelling and Rash    Current Outpatient Prescriptions  Medication Sig Dispense Refill  . amLODipine (NORVASC) 5 MG tablet Take 1 tablet (5 mg total) by mouth daily. 30 tablet 5  . aspirin EC 81 MG tablet Take 81 mg by mouth daily.    . Marland Kitchentorvastatin (LIPITOR) 80 MG tablet TAKE 1 TABLET BY MOUTH EVERY DAY. MUST KEEP 06/28/13 APPOINTMENT FOR FUTURE REFILLS 30 tablet 6  . EFFIENT 10 MG TABS tablet TAKE 1 TABLET BY MOUTH DAILY*NEEDS APPOINTMENT* 30 tablet 1  . ezetimibe (ZETIA) 10 MG tablet Take 1 tablet (10 mg total) by mouth daily. 30 tablet 5  . Insulin Glargine (LANTUS) 100 UNIT/ML Solostar Pen Inject 35 Units into the skin daily at 10 pm.     . isosorbide mononitrate (IMDUR) 60 MG 24 hr tablet Take 1.5 tablets by mouth once daily. <please make appointment> 45 tablet 6  . losartan (COZAAR) 100 MG tablet Take 1 tablet (100 mg total) by mouth daily. 30 tablet 5  . metFORMIN (GLUCOPHAGE) 1000 MG tablet Take 1,000 mg by mouth 2 (two) times daily with a meal.    . pantoprazole (PROTONIX) 40 MG tablet TAKE 1 TABLET BY MOUTH EVERY DAY 30 tablet 6  . metoprolol succinate (TOPROL-XL) 50 MG 24 hr tablet Take 1/2 tablet daily for 2 weeks then  increase to 1 tablet daily. 30 tablet 6   No current facility-administered medications for this visit.    Social History   Social History  . Marital Status: Married    Spouse Name: N/A  . Number of Children: N/A  . Years of Education: N/A   Occupational History  . Not on file.   Social History Main Topics  . Smoking status: Former Smoker    Types: Cigarettes    Quit date: 10/04/2009  . Smokeless tobacco: Never Used  . Alcohol Use: No  . Drug Use: No  . Sexual Activity: Yes    Birth Control/ Protection: Surgical   Other Topics Concern  . Not on file   Social History Narrative    Family History  Problem Relation Age of Onset  . Hypertension Mother     ROS General: Negative; No fevers, chills, or night sweats HEENT: Negative; No changes in vision or hearing, sinus congestion, difficulty swallowing Pulmonary: Negative; No cough, wheezing, shortness of breath, hemoptysis Cardiovascular: See  HPI: Positive for claudication. GI: Negative; No nausea, vomiting, diarrhea, or abdominal pain GU: Negative; No dysuria, hematuria, or difficulty voiding Musculoskeletal: Negative; no myalgias, joint pain, or weakness Hematologic: Negative; no easy bruising, bleeding Endocrine: Positive for poorly controlled diabetes Neuro: Positive for peripheral neuropathy Skin: Negative; No rashes or skin lesions Psychiatric: Negative; No behavioral problems, depression Sleep: Negative; No snoring,  daytime sleepiness, hypersomnolence, bruxism, restless legs, hypnogognic hallucinations. Other comprehensive 14 point system review is negative   Physical Exam BP 150/70 mmHg  Pulse 89  Ht 5' 4"  (1.626 m)  Wt 172 lb 1.6 oz (78.064 kg)  BMI 29.53 kg/m2 Wt Readings from Last 3 Encounters:  10/13/14 172 lb 1.6 oz (78.064 kg)  06/28/13 170 lb 14.4 oz (77.52 kg)  03/03/13 169 lb 9.6 oz (76.93 kg)   General: Alert, oriented, no distress.  Skin: normal turgor, no rashes, warm and dry HEENT:  Normocephalic, atraumatic. Pupils equal round and reactive to light; sclera anicteric; extraocular muscles intact, No lid lag; Nose without nasal septal hypertrophy; Mouth/Parynx benign; Mallinpatti scale 3 Neck: No JVD, positive for carotid bruit Lungs: clear to ausculatation and percussion bilaterally; no wheezing or rales, normal inspiratory and expiratory effort Chest wall: without tenderness to palpitation Heart: PMI not displaced, RRR, s1 s2 normal, 2/6 systolic murmur, No diastolic murmur, no rubs, gallops, thrills, or heaves Abdomen: soft, nontender; no hepatosplenomehaly, BS+; abdominal aorta nontender and not dilated by palpation. Back: no CVA tenderness Pulses: 2+ positive for bilateral femoral bruits Musculoskeletal: full range of motion, normal strength, no joint deformities Extremities: Pulses 2+, no clubbing cyanosis or edema, Homan's sign negative  Neurologic: grossly nonfocal; Cranial nerves grossly wnl Psychologic: Normal mood and affect   ECG (independently read by me): Normal sinus rhythm at 89 bpm.  Normal intervals.  LABS:  BMP Latest Ref Rng 03/04/2013 03/02/2013 03/02/2013  Glucose 70 - 99 mg/dL 234(H) 408(H) 415(H)  BUN 6 - 23 mg/dL 12 15 15   Creatinine 0.50 - 1.10 mg/dL 0.56 0.70 0.57  Sodium 137 - 147 mEq/L 139 134(L) 132(L)  Potassium 3.7 - 5.3 mEq/L 4.2 4.0 4.2  Chloride 96 - 112 mEq/L 103 98 93(L)  CO2 19 - 32 mEq/L 21 - 22  Calcium 8.4 - 10.5 mg/dL 9.1 - 9.3     Hepatic Function Latest Ref Rng 10/13/2013 03/02/2013 10/13/2009  Total Protein 6.0 - 8.3 g/dL 7.2 7.5 6.8  Albumin 3.5 - 5.2 g/dL 4.2 3.5 3.7  AST 0 - 37 U/L 19 18 222(H)  ALT 0 - 35 U/L 20 18 62(H)  Alk Phosphatase 39 - 117 U/L 77 98 92  Total Bilirubin 0.2 - 1.2 mg/dL 0.5 0.5 0.5  Bilirubin, Direct 0.0 - 0.3 mg/dL 0.1 - -    CBC Latest Ref Rng 03/04/2013 03/02/2013 03/02/2013  WBC 4.0 - 10.5 K/uL 7.8 - 9.5  Hemoglobin 12.0 - 15.0 g/dL 14.4 15.6(H) 15.4(H)  Hematocrit 36.0 - 46.0 % 40.4 46.0 41.6   Platelets 150 - 400 K/uL 194 - 224   Lab Results  Component Value Date   MCV 86.3 03/04/2013   MCV 85.4 03/02/2013   MCV 85.8 06/05/2011    Lab Results  Component Value Date   TSH 0.634 10/13/2009    BNP No results found for: BNP  ProBNP    Component Value Date/Time   PROBNP 565.0* 10/18/2009 0500     Lipid Panel     Component Value Date/Time   CHOL 106 10/13/2013 0913   TRIG 160* 10/13/2013 0913  HDL 33* 10/13/2013 0913   CHOLHDL 3.2 10/13/2013 0913   VLDL 32 10/13/2013 0913   LDLCALC 41 10/13/2013 0913     RADIOLOGY: No results found.    ASSESSMENT AND PLAN: Ms. Pattijo Juste is a 64 year old female who suffered an ST segment elevation MI in September 2011 secondary to total left circumflex occlusion.  She status post intervention with DES stenting proximally and PTCA of diffuse disease distally.  She has significant peripheral vascular disease and is status post prior intervention by Dr. Gwenlyn Found.  She has chronic claudication symptomatology.  She has diabetes which has not been well controlled and she has significant diabetic neuropathy.  Her blood pressure today is increased on losartan 100 mg, Imdur 30 mg and amlodipine 5 mg with initial blood pressure by the nurse 150/70 and repeat blood pressure by me 180/84.  Her resting pulse is 89.  I have recommended initiation of Toprol-XL 25 mg per 2 weeks and then she will increase this to 50 mg.  I'm scheduling her for 2-D echo Doppler study to further evaluate her systolic and diastolic function as well as 2/6 systolic murmur.  She will undergo follow-up carotid duplex imaging, as well as lower extremity Dopplers.  Laboratory will be obtained in the fasting state.  I will see her back in the office in 2 months for reevaluation.    Time spent: 30 minutes  Troy Sine, MD, [facility]  10/15/2014 4:39 PM

## 2014-10-19 ENCOUNTER — Other Ambulatory Visit: Payer: Self-pay | Admitting: Cardiovascular Disease

## 2014-10-19 DIAGNOSIS — I739 Peripheral vascular disease, unspecified: Secondary | ICD-10-CM

## 2014-10-24 ENCOUNTER — Other Ambulatory Visit (HOSPITAL_COMMUNITY): Payer: Commercial Managed Care - HMO

## 2014-10-26 ENCOUNTER — Other Ambulatory Visit: Payer: Self-pay | Admitting: Cardiovascular Disease

## 2014-10-26 ENCOUNTER — Ambulatory Visit (HOSPITAL_BASED_OUTPATIENT_CLINIC_OR_DEPARTMENT_OTHER)
Admission: RE | Admit: 2014-10-26 | Discharge: 2014-10-26 | Disposition: A | Discharge status: Home / Self Care | Payer: Commercial Managed Care - HMO | Source: Ambulatory Visit | Attending: Urology | Admitting: Urology

## 2014-10-26 ENCOUNTER — Ambulatory Visit (HOSPITAL_COMMUNITY)
Admission: RE | Admit: 2014-10-26 | Discharge: 2014-10-26 | Disposition: A | Discharge status: Home / Self Care | Payer: Commercial Managed Care - HMO | Source: Ambulatory Visit | Attending: Cardiovascular Disease | Admitting: Cardiovascular Disease

## 2014-10-26 DIAGNOSIS — E785 Hyperlipidemia, unspecified: Secondary | ICD-10-CM | POA: Diagnosis not present

## 2014-10-26 DIAGNOSIS — Z87891 Personal history of nicotine dependence: Secondary | ICD-10-CM | POA: Diagnosis not present

## 2014-10-26 DIAGNOSIS — E119 Type 2 diabetes mellitus without complications: Secondary | ICD-10-CM | POA: Diagnosis not present

## 2014-10-26 DIAGNOSIS — I739 Peripheral vascular disease, unspecified: Secondary | ICD-10-CM | POA: Insufficient documentation

## 2014-10-26 DIAGNOSIS — R0989 Other specified symptoms and signs involving the circulatory and respiratory systems: Secondary | ICD-10-CM | POA: Insufficient documentation

## 2014-10-26 DIAGNOSIS — I2510 Atherosclerotic heart disease of native coronary artery without angina pectoris: Secondary | ICD-10-CM | POA: Insufficient documentation

## 2014-10-26 DIAGNOSIS — I6523 Occlusion and stenosis of bilateral carotid arteries: Secondary | ICD-10-CM | POA: Diagnosis not present

## 2014-10-26 DIAGNOSIS — Z95820 Peripheral vascular angioplasty status with implants and grafts: Secondary | ICD-10-CM | POA: Diagnosis not present

## 2014-10-28 ENCOUNTER — Ambulatory Visit (HOSPITAL_COMMUNITY): Payer: Commercial Managed Care - HMO | Attending: Cardiovascular Disease

## 2014-10-28 ENCOUNTER — Other Ambulatory Visit: Payer: Self-pay

## 2014-10-28 DIAGNOSIS — E785 Hyperlipidemia, unspecified: Secondary | ICD-10-CM | POA: Insufficient documentation

## 2014-10-28 DIAGNOSIS — E119 Type 2 diabetes mellitus without complications: Secondary | ICD-10-CM | POA: Insufficient documentation

## 2014-10-28 DIAGNOSIS — I10 Essential (primary) hypertension: Secondary | ICD-10-CM | POA: Diagnosis not present

## 2014-10-28 DIAGNOSIS — I517 Cardiomegaly: Secondary | ICD-10-CM | POA: Diagnosis not present

## 2014-10-28 DIAGNOSIS — F17200 Nicotine dependence, unspecified, uncomplicated: Secondary | ICD-10-CM | POA: Insufficient documentation

## 2014-10-28 DIAGNOSIS — R011 Cardiac murmur, unspecified: Secondary | ICD-10-CM

## 2014-10-29 ENCOUNTER — Other Ambulatory Visit: Payer: Self-pay | Admitting: Cardiovascular Disease

## 2014-10-31 NOTE — Telephone Encounter (Signed)
Rx(s) sent to pharmacy electronically.  

## 2014-11-03 ENCOUNTER — Ambulatory Visit: Payer: Commercial Managed Care - HMO | Admitting: Podiatry

## 2014-11-07 ENCOUNTER — Other Ambulatory Visit: Payer: Self-pay | Admitting: Cardiovascular Disease

## 2014-11-07 ENCOUNTER — Telehealth: Payer: Self-pay | Admitting: *Deleted

## 2014-11-07 NOTE — Telephone Encounter (Signed)
-----   Message from Lennette Bihari, MD sent at 11/06/2014 12:44 PM EDT ----- Severe ABI reduction; show Dr. Allyson Sabal and f/u with him ASAP

## 2014-11-07 NOTE — Telephone Encounter (Signed)
Patient notified of results and recommendations. I spoke with [name] [name]) she will call patient with ASAP appointment.

## 2014-11-08 NOTE — Telephone Encounter (Signed)
Rx request sent to pharmacy.  

## 2014-11-16 ENCOUNTER — Encounter: Payer: Commercial Managed Care - HMO | Admitting: Cardiovascular Disease

## 2014-12-03 ENCOUNTER — Other Ambulatory Visit: Payer: Self-pay | Admitting: Cardiovascular Disease

## 2014-12-19 ENCOUNTER — Ambulatory Visit: Payer: Commercial Managed Care - HMO | Admitting: Cardiovascular Disease

## 2015-01-03 ENCOUNTER — Encounter: Payer: Self-pay | Admitting: Cardiovascular Disease

## 2015-01-03 ENCOUNTER — Ambulatory Visit (INDEPENDENT_AMBULATORY_CARE_PROVIDER_SITE_OTHER): Payer: Commercial Managed Care - HMO | Admitting: Cardiovascular Disease

## 2015-01-03 VITALS — BP 142/118 | HR 88 | Ht 64.0 in | Wt 167.0 lb

## 2015-01-03 DIAGNOSIS — I739 Peripheral vascular disease, unspecified: Secondary | ICD-10-CM | POA: Diagnosis not present

## 2015-01-03 NOTE — Progress Notes (Signed)
01/03/2015 [name] [name]   [birth year]  [id number]  Primary Physician Georgann Housekeeper, MD Primary Cardiologist: Runell Gess MD Roseanne Reno   HPI:  [name] [name] is a 64 year old Caucasian female patient of Dr. Landry Dyke with a history of CAD and PAD. I last saw her at the time of her previous angiogram 06/04/11. She has had right common and external iliac artery stenting as well as diamondback atherectomy of her right SFA, PTA and stenting of that vessel as well with known occluded left SFA. She's had redilatation of her right SFA for "in-stent restenosis. I re-angiogramed 06/04/11  Demonstrating a patent right common iliac artery stent, an occluded right SFA stent, 60% proximal left external iliac artery stenosis with a 23 mm gradient, 80% left common femoral artery stenosis. She has severe lifestyle including claudication as well as rest pain on the left with recent Dopplers performed 10/26/14 revealing a right ABI 0.49 and a left upper and 25. We decide to proceed with angiography and potential intervention of her left external iliac artery. She may ultimately require left common femoral endarterectomy and patch angioplasty plus or minus femoropopliteal bypass grafting.her other problems include history of CAD status post circumflex stenting and at the time of a myocardial infarction. She has a history of hypertension, diabetes and hyperlipidemia as well. She denies chest pain or shortness of breath. She has severe lifestyle limiting claudication.   Current Outpatient Prescriptions  Medication Sig Dispense Refill  . amLODipine (NORVASC) 5 MG tablet TAKE 1 TABLET BY MOUTH EVERY DAY 30 tablet 0  . aspirin EC 81 MG tablet Take 81 mg by mouth daily.    Marland Kitchen atorvastatin (LIPITOR) 80 MG tablet TAKE 1 TABLET BY MOUTH EVERY DAY. MUST KEEP 06/28/13 APPOINTMENT FOR FUTURE REFILLS 30 tablet 6  . EFFIENT 10 MG TABS tablet TAKE 1 TABLET BY MOUTH DAILY. NEEDS APPT. 30 tablet 1  . ezetimibe (ZETIA) 10 MG  tablet Take 1 tablet (10 mg total) by mouth daily. 30 tablet 5  . Insulin Glargine (LANTUS) 100 UNIT/ML Solostar Pen Inject 35 Units into the skin daily at 10 pm.     . isosorbide mononitrate (IMDUR) 60 MG 24 hr tablet Take 1.5 tablets by mouth once daily. <please make appointment> 45 tablet 6  . losartan (COZAAR) 100 MG tablet TAKE 1 TABLET BY MOUTH EVERY DAY 30 tablet 11  . metFORMIN (GLUCOPHAGE) 1000 MG tablet Take 1,000 mg by mouth 2 (two) times daily with a meal.    . metoprolol succinate (TOPROL-XL) 50 MG 24 hr tablet Take 1/2 tablet daily for 2 weeks then increase to 1 tablet daily. (Patient taking differently: Take 50 mg by mouth daily. ) 30 tablet 6  . pantoprazole (PROTONIX) 40 MG tablet TAKE 1 TABLET BY MOUTH EVERY DAY 30 tablet 6   No current facility-administered medications for this visit.    Allergies  Allergen Reactions  . Hydrochlorothiazide   . Latex Rash  . Penicillins Swelling and Rash    Social History   Social History  . Marital Status: Married    Spouse Name: N/A  . Number of Children: N/A  . Years of Education: N/A   Occupational History  . Not on file.   Social History Main Topics  . Smoking status: Former Smoker    Types: Cigarettes    Quit date: 10/04/2009  . Smokeless tobacco: Never Used  . Alcohol Use: No  . Drug Use: No  . Sexual Activity: Yes    Birth  Control/ Protection: Surgical   Other Topics Concern  . Not on file   Social History Narrative     Review of Systems: General: negative for chills, fever, night sweats or weight changes.  Cardiovascular: negative for chest pain, dyspnea on exertion, edema, orthopnea, palpitations, paroxysmal nocturnal dyspnea or shortness of breath Dermatological: negative for rash Respiratory: negative for cough or wheezing Urologic: negative for hematuria Abdominal: negative for nausea, vomiting, diarrhea, bright red blood per rectum, melena, or hematemesis Neurologic: negative for visual changes,  syncope, or dizziness All other systems reviewed and are otherwise negative except as noted above.    Blood pressure 142/118, pulse 88, height  (1.626 m), weight 167 lb (75.751 kg).  General appearance: alert and no distress Neck: no adenopathy, no JVD, supple, symmetrical, trachea midline, thyroid not enlarged, symmetric, no tenderness/mass/nodules and bbilateral carotid bruits Lungs: clear to auscultation bilaterally Heart: regular rate and rhythm, S1, S2 normal, no murmur, click, rub or gallop Extremities: 1+ femorals without bruits bilaterally  EKG not performed today  ASSESSMENT AND PLAN:   PAD (peripheral artery disease) (HCC) [name] [name] is a 64 year old Caucasian female patient of Dr. Landry Dyke with a history of CAD and PAD. She has had right common and external iliac artery stenting as well as diamondback atherectomy of her right SFA, PTA and stenting of that vessel as well with known occluded left SFA. She's had redilatation of her right SFA for "in-stent restenosis. I re-angiogramed 06/04/11  Demonstrating a patent right common iliac artery stent, an occluded right SFA stent, 60% proximal left external iliac artery stenosis with a 23 mm gradient, 80% left common femoral artery stenosis. She has severe lifestyle including claudication as well as rest pain on the left with recent Dopplers performed 10/26/14 revealing a right ABI 0.49 and a left upper and 25. We decide to proceed with angiography and potential intervention of her left external iliac artery. She may ultimately require left common femoral endarterectomy and patch angioplasty plus or minus femoropopliteal bypass grafting.      Runell Gess MD FACP,FACC,FAHA, [facility] 01/03/2015 11:29 AM

## 2015-01-03 NOTE — Assessment & Plan Note (Signed)
[name] is a 64 year old Caucasian female patient of Dr. Landry Dyke with a history of CAD and PAD. She has had right common and external iliac artery stenting as well as diamondback atherectomy of her right SFA, PTA and stenting of that vessel as well with known occluded left SFA. She's had redilatation of her right SFA for "in-stent restenosis. I re-angiogramed 06/04/11  Demonstrating a patent right common iliac artery stent, an occluded right SFA stent, 60% proximal left external iliac artery stenosis with a 23 mm gradient, 80% left common femoral artery stenosis. She has severe lifestyle including claudication as well as rest pain on the left with recent Dopplers performed 10/26/14 revealing a right ABI 0.49 and a left upper and 25. We decide to proceed with angiography and potential intervention of her left external iliac artery. She may ultimately require left common femoral endarterectomy and patch angioplasty plus or minus femoropopliteal bypass grafting.

## 2015-01-03 NOTE — Patient Instructions (Addendum)
Medication Instructions:  Your physician recommends that you continue on your current medications as directed. Please refer to the Current Medication list given to you today.   Labwork: Your physician recommends that you return for lab work in: TODAY The lab can be found on the FIRST FLOOR of out building in [address]   Testing/Procedures: Dr. Allyson Sabal has ordered a peripheral angiogram to be done at [facility].  This procedure is going to look at the bloodflow in your lower extremities.  If Dr. Allyson Sabal is able to open up the arteries, you will have to spend one night in the hospital.  If he is not able to open the arteries, you will be able to go home that same day.  SCHEDULE ON 12/8  After the procedure, you will not be allowed to drive for 3 days or push, pull, or lift anything greater than 10 lbs for one week.    You will be required to have the following tests prior to the procedure:  1. Blood work-the blood work can be done no more than 7 days prior to the procedure.  It can be done at any [facility] lab.  There is one downstairs on the first floor of this building and one in the [facility] building [phone] [address], [address])  2. Chest Xray-the chest xray order has already been placed at the [facility].     *REPS: TO BE DECIDED  Puncture site: RIGHT GROIN    Any Other Special Instructions Will Be Listed Below (If Applicable).     If you need a refill on your cardiac medications before your next appointment, please call your pharmacy.

## 2015-01-12 ENCOUNTER — Other Ambulatory Visit: Payer: Self-pay | Admitting: Cardiovascular Disease

## 2015-01-13 NOTE — Telephone Encounter (Signed)
Rx(s) sent to pharmacy electronically.  

## 2015-01-16 ENCOUNTER — Telehealth: Payer: Self-pay | Admitting: Podiatry

## 2015-01-16 NOTE — Telephone Encounter (Signed)
Left voicemail to call to schedule appt.

## 2015-01-18 ENCOUNTER — Other Ambulatory Visit: Payer: Self-pay

## 2015-01-18 DIAGNOSIS — I739 Peripheral vascular disease, unspecified: Secondary | ICD-10-CM

## 2015-01-23 ENCOUNTER — Ambulatory Visit: Payer: Commercial Managed Care - HMO | Admitting: Podiatry

## 2015-01-29 DIAGNOSIS — M869 Osteomyelitis, unspecified: Secondary | ICD-10-CM

## 2015-01-29 HISTORY — DX: Osteomyelitis, unspecified: M86.9

## 2015-01-31 ENCOUNTER — Telehealth: Payer: Self-pay | Admitting: Cardiovascular Disease

## 2015-01-31 NOTE — Telephone Encounter (Signed)
Pt called in stating that she will need to cancel her Catheterization on 1/5 and will need to r/s for another time. Please f/u with her  Thanks

## 2015-01-31 NOTE — Telephone Encounter (Signed)
Left message for pt to call.

## 2015-01-31 NOTE — Telephone Encounter (Signed)
Left message to call back.  Want to verify pt wants to cancel 1/5 procedure

## 2015-02-01 NOTE — Telephone Encounter (Signed)
Spoke with pt she is sick and would like to reschedule procedure, she will call us when ready.

## 2015-02-02 ENCOUNTER — Encounter (HOSPITAL_COMMUNITY): Admission: RE | Payer: Self-pay | Source: Ambulatory Visit

## 2015-02-02 ENCOUNTER — Ambulatory Visit (HOSPITAL_COMMUNITY)
Admission: RE | Admit: 2015-02-02 | Payer: Commercial Managed Care - HMO | Source: Ambulatory Visit | Admitting: Cardiovascular Disease

## 2015-02-02 SURGERY — LOWER EXTREMITY ANGIOGRAPHY
Anesthesia: LOCAL

## 2015-02-08 ENCOUNTER — Other Ambulatory Visit: Payer: Self-pay | Admitting: Cardiovascular Disease

## 2015-02-08 NOTE — Telephone Encounter (Signed)
Rx request sent to pharmacy.  

## 2015-03-01 ENCOUNTER — Other Ambulatory Visit: Payer: Self-pay | Admitting: Cardiovascular Disease

## 2015-03-01 NOTE — Telephone Encounter (Signed)
Rx(s) sent to pharmacy electronically.  

## 2015-03-17 ENCOUNTER — Other Ambulatory Visit: Payer: Self-pay | Admitting: Cardiovascular Disease

## 2015-03-20 NOTE — Telephone Encounter (Signed)
Rx(s) sent to pharmacy electronically.  

## 2015-03-24 ENCOUNTER — Other Ambulatory Visit: Payer: Self-pay | Admitting: Cardiovascular Disease

## 2015-03-27 NOTE — Telephone Encounter (Signed)
Rx(s) sent to pharmacy electronically.  

## 2015-03-28 ENCOUNTER — Other Ambulatory Visit: Payer: Self-pay | Admitting: Cardiovascular Disease

## 2015-03-28 NOTE — Telephone Encounter (Signed)
REFILL 

## 2015-04-03 ENCOUNTER — Other Ambulatory Visit: Payer: Self-pay | Admitting: Internal Medicine

## 2015-04-03 DIAGNOSIS — Z1231 Encounter for screening mammogram for malignant neoplasm of breast: Secondary | ICD-10-CM

## 2015-04-12 ENCOUNTER — Ambulatory Visit
Admission: RE | Admit: 2015-04-12 | Discharge: 2015-04-12 | Disposition: A | Discharge status: Home / Self Care | Payer: Commercial Managed Care - HMO | Source: Ambulatory Visit | Attending: Internal Medicine | Admitting: Internal Medicine

## 2015-04-12 DIAGNOSIS — Z1231 Encounter for screening mammogram for malignant neoplasm of breast: Secondary | ICD-10-CM

## 2015-04-19 ENCOUNTER — Telehealth: Payer: Self-pay | Admitting: Cardiovascular Disease

## 2015-04-19 MED ORDER — PRASUGREL HCL 10 MG PO TABS: 10.0000 mg | ORAL_TABLET | Freq: Every day | ORAL | Status: DC

## 2015-04-19 NOTE — Telephone Encounter (Signed)
Pt called in wanting to speak with a nurse about taking another medication besides Effient. She said it is too expensive and will cause her to go in the donut hole. Please f/u with her   Thanks

## 2015-04-19 NOTE — Telephone Encounter (Signed)
Returning your call. °

## 2015-04-19 NOTE — Telephone Encounter (Signed)
Left msg for patient to call.  I have provided samples at front desk of Effient.

## 2015-04-19 NOTE — Telephone Encounter (Signed)
Returned call to patient. Notes her medicare coverage won't kick in until May, she needs samples until that time. Informed her I have samples at front desk for her. Advised pt to notify us when she needs samples, we can try to keep her supplied until May - she voiced understanding, thanks.

## 2015-05-30 DIAGNOSIS — L570 Actinic keratosis: Secondary | ICD-10-CM | POA: Diagnosis not present

## 2015-05-30 DIAGNOSIS — B353 Tinea pedis: Secondary | ICD-10-CM | POA: Diagnosis not present

## 2015-06-09 ENCOUNTER — Other Ambulatory Visit: Payer: Self-pay | Admitting: Cardiovascular Disease

## 2015-06-12 NOTE — Telephone Encounter (Signed)
Rx request sent to pharmacy.  

## 2015-06-20 ENCOUNTER — Telehealth: Payer: Self-pay

## 2015-06-20 MED ORDER — PRASUGREL HCL 10 MG PO TABS: 10.0000 mg | ORAL_TABLET | Freq: Every day | ORAL | Status: DC

## 2015-06-20 NOTE — Telephone Encounter (Signed)
Patient called in today to get samples of Zetia and Effient. Informed patient that we did not have samples of Zetia, but samples of Effient were placed up front for pickup. Patient informed me that her insurance does not cover Zetia and would to be put on something else. Please advise.

## 2015-06-28 NOTE — Telephone Encounter (Signed)
Dr Tresa Endo please address. Thanks.

## 2015-06-29 NOTE — Telephone Encounter (Signed)
Continue lipitor 80mg daily  

## 2015-06-30 MED ORDER — ATORVASTATIN CALCIUM 80 MG PO TABS
80.0000 mg | ORAL_TABLET | Freq: Every day | ORAL | Status: DC
Start: 2015-06-30 — End: 2016-08-27

## 2015-06-30 NOTE — Telephone Encounter (Signed)
Left message on voicemail informing patient that Zetia has been changed to Lipitor 80 mg QD and I will send rx over to [facility] on [address], advised patient to contact office if she had any questions or concerns.

## 2015-07-03 ENCOUNTER — Telehealth: Payer: Self-pay | Admitting: Cardiovascular Disease

## 2015-07-03 DIAGNOSIS — Z79899 Other long term (current) drug therapy: Secondary | ICD-10-CM

## 2015-07-03 DIAGNOSIS — Z5181 Encounter for therapeutic drug level monitoring: Secondary | ICD-10-CM

## 2015-07-03 DIAGNOSIS — Z7902 Long term (current) use of antithrombotics/antiplatelets: Principal | ICD-10-CM

## 2015-07-03 NOTE — Telephone Encounter (Signed)
Clarified instruction for lipitor - continue current dose of 80mg . Pt voiced understanding. She wanted samples of Effient as well.  Informed pt we do not have Effient samples - manufacturer has provided [place] w savings card but this is not medicare reimbursable. Pt voiced acknowledgment - wants to know if any other options. She has difficulty in getting the Effient due to cost.

## 2015-07-03 NOTE — Telephone Encounter (Signed)
New message     Talk to the nurse about her generic lipitor.

## 2015-07-04 NOTE — Telephone Encounter (Signed)
Reviewed with Dr. Allyson Sabal.  Ok to switch patient to clopidogrel from Effient.  Please have P2Y12 drawn 2-3 weeks after switching.

## 2015-07-05 MED ORDER — CLOPIDOGREL BISULFATE 75 MG PO TABS: 75.0000 mg | ORAL_TABLET | Freq: Every day | ORAL | Status: DC

## 2015-07-05 NOTE — Telephone Encounter (Signed)
Called patient and discussed recommendations. Relayed instructions to start the Plavix (24 hrs after last Effient dose) and to have P2Y12 test performed at [facility] 2-3 weeks after this. Pt voiced understanding. Rx sent to her preferred pharmacy. Lab slip ordered and mailed to her.

## 2015-08-02 ENCOUNTER — Other Ambulatory Visit: Payer: Self-pay | Admitting: Cardiovascular Disease

## 2015-08-17 ENCOUNTER — Telehealth: Payer: Self-pay | Admitting: Cardiovascular Disease

## 2015-08-17 NOTE — Telephone Encounter (Signed)
Follow-up ° ° ° ° °The pt is returning the nurses call °

## 2015-08-17 NOTE — Telephone Encounter (Signed)
Pt needs appt asap. ,

## 2015-08-17 NOTE — Telephone Encounter (Signed)
No answer. Left message to call back.   

## 2015-08-18 ENCOUNTER — Ambulatory Visit
Admission: RE | Admit: 2015-08-18 | Discharge: 2015-08-18 | Disposition: A | Discharge status: Home / Self Care | Payer: Medicare Other | Source: Ambulatory Visit | Attending: Cardiovascular Disease | Admitting: Cardiovascular Disease

## 2015-08-18 ENCOUNTER — Other Ambulatory Visit: Payer: Self-pay | Admitting: *Deleted

## 2015-08-18 ENCOUNTER — Encounter: Payer: Self-pay | Admitting: Cardiovascular Disease

## 2015-08-18 ENCOUNTER — Ambulatory Visit (INDEPENDENT_AMBULATORY_CARE_PROVIDER_SITE_OTHER): Payer: Medicare Other | Admitting: Cardiovascular Disease

## 2015-08-18 VITALS — BP 130/62 | HR 84 | Ht 64.0 in | Wt 164.4 lb

## 2015-08-18 DIAGNOSIS — I739 Peripheral vascular disease, unspecified: Secondary | ICD-10-CM

## 2015-08-18 DIAGNOSIS — Z79899 Other long term (current) drug therapy: Secondary | ICD-10-CM

## 2015-08-18 DIAGNOSIS — D689 Coagulation defect, unspecified: Secondary | ICD-10-CM | POA: Diagnosis not present

## 2015-08-18 DIAGNOSIS — Z01818 Encounter for other preprocedural examination: Secondary | ICD-10-CM

## 2015-08-18 DIAGNOSIS — R0602 Shortness of breath: Secondary | ICD-10-CM | POA: Diagnosis not present

## 2015-08-18 NOTE — Patient Instructions (Signed)
Medication Instructions:  Your physician recommends that you continue on your current medications as directed. Please refer to the Current Medication list given to you today.   Testing/Procedures: Your physician has requested that you have a lower extremity arterial doppler- During this test, ultrasound is used to evaluate arterial blood flow in the legs. Allow approximately one hour for this exam. WILL NEED PRIOR TO THE ANGIOGRAM.    Dr. Allyson Sabal has ordered a peripheral angiogram to be done at [facility].  This procedure is going to look at the bloodflow in your lower extremities.  If Dr. Allyson Sabal is able to open up the arteries, you will have to spend one night in the hospital.  If he is not able to open the arteries, you will be able to go home that same day.    After the procedure, you will not be allowed to drive for 3 days or push, pull, or lift anything greater than 10 lbs for one week.    You will be required to have the following tests prior to the procedure:  1. Blood work-the blood work can be done no more than 14 days prior to the procedure.  It can be done at any [facility] lab.  There is one downstairs on the first floor of this building and one in the [facility] building [phone] [address], [address])  2. Chest Xray-the chest xray order has already been placed at the [facility].     *REPS none per Dr Allyson Sabal  Puncture site RIGHT GROIN   If you need a refill on your cardiac medications before your next appointment, please call your pharmacy.

## 2015-08-18 NOTE — Assessment & Plan Note (Signed)
Mrs. Nalle returns today for follow-up of her peripheral arterial disease. She has rest pain at this point with a nonhealing ulcer on her left foot. Her left lower extremity Dopplers performed 10/18/14 revealed a right ABI 0.49 and a left point to 5. She does have known occluded SFAs bilaterally probably not pertinent his intractable with high-grade common femoral disease. She also has diabetes with diabetic neuropathy. At this point we'll proceed with angiography to further define her anatomy in anticipation of potential femoropopliteal bypass grafting.

## 2015-08-18 NOTE — Progress Notes (Signed)
08/18/2015 [name] [name]   07-Oct-1950  [id number]  Primary Physician Georgann Housekeeper, MD Primary Cardiologist: Runell Gess MD Roseanne Reno  HPI:  [name] [name] is a 65 year old Caucasian female patient of Dr. Landry Dyke with a history of CAD and PAD. I last saw her at the time of her previous angiogram 01/03/15. She has had right common and external iliac artery stenting as well as diamondback atherectomy of her right SFA, PTA and stenting of that vessel as well with known occluded left SFA. She's had redilatation of her right SFA for "in-stent restenosis. I re-angiogramed 06/04/11 Demonstrating a patent right common iliac artery stent, an occluded right SFA stent, 60% proximal left external iliac artery stenosis with a 23 mm gradient, 80% left common femoral artery stenosis. She has severe lifestyle including claudication as well as rest pain on the left with recent Dopplers performed 10/26/14 revealing a right ABI 0.49 and a left upper and 25. We decide to proceed with angiography and potential intervention of her left external iliac artery. She may ultimately require left common femoral endarterectomy and patch angioplasty plus or minus femoropopliteal bypass grafting.her other problems include history of CAD status post circumflex stenting and at the time of a myocardial infarction. She has a history of hypertension, diabetes and hyperlipidemia as well. She denies chest pain or shortness of breath. She has severe lifestyle limiting claudication, and now has rest pain with a nonhealing ulcer on her left foot. Her left lower extremity arterial Doppler studies performed 10/26/14 showed a right ABI 0.49 and a left of 0.25. Her SFAs were occluded and she had high-grade common femoral disease bilaterally.    Current Outpatient Prescriptions  Medication Sig Dispense Refill  . amLODipine (NORVASC) 5 MG tablet TAKE 1 TABLET(5 MG) BY MOUTH DAILY 30 tablet 11  . aspirin EC 81 MG tablet Take 81 mg by  mouth daily.    Marland Kitchen atorvastatin (LIPITOR) 80 MG tablet Take 1 tablet (80 mg total) by mouth daily. 90 tablet 1  . clopidogrel (PLAVIX) 75 MG tablet Take 1 tablet (75 mg total) by mouth daily. 30 tablet 5  . gabapentin (NEURONTIN) 100 MG capsule Take 100 mg by mouth 2 (two) times daily.  3  . Insulin Glargine (LANTUS) 100 UNIT/ML Solostar Pen Inject 35 Units into the skin daily at 10 pm.     . isosorbide mononitrate (IMDUR) 60 MG 24 hr tablet Take 1.5 tablets by mouth once daily. <please make appointment> (Patient taking differently: Take 90 mg by mouth daily. ) 45 tablet 6  . losartan (COZAAR) 100 MG tablet TAKE 1 TABLET BY MOUTH EVERY DAY 30 tablet 11  . metFORMIN (GLUCOPHAGE) 1000 MG tablet Take 1,000 mg by mouth 2 (two) times daily with a meal.    . metoprolol succinate (TOPROL-XL) 50 MG 24 hr tablet TAKE 1 TABLET(50 MG) BY MOUTH DAILY 30 tablet 0  . pantoprazole (PROTONIX) 40 MG tablet Take 1 tablet (40 mg total) by mouth daily. 30 tablet 10   No current facility-administered medications for this visit.    Allergies  Allergen Reactions  . Hydrochlorothiazide   . Latex Rash  . Penicillins Swelling and Rash    Has patient had a PCN reaction causing immediate rash, facial/tongue/throat swelling, SOB or lightheadedness with hypotension: Yes Has patient had a PCN reaction causing severe rash involving mucus membranes or skin necrosis: No Has patient had a PCN reaction that required hospitalization No Has patient had a PCN reaction occurring  within the last 10 years: No If all of the above answers are "NO", then may proceed with Cephalosporin use.     Social History   Social History  . Marital Status: Married    Spouse Name: N/A  . Number of Children: N/A  . Years of Education: N/A   Occupational History  . Not on file.   Social History Main Topics  . Smoking status: Former Smoker    Types: Cigarettes    Quit date: 10/04/2009  . Smokeless tobacco: Never Used  . Alcohol Use: No   . Drug Use: No  . Sexual Activity: Yes    Birth Control/ Protection: Surgical   Other Topics Concern  . Not on file   Social History Narrative     Review of Systems: General: negative for chills, fever, night sweats or weight changes.  Cardiovascular: negative for chest pain, dyspnea on exertion, edema, orthopnea, palpitations, paroxysmal nocturnal dyspnea or shortness of breath Dermatological: negative for rash Respiratory: negative for cough or wheezing Urologic: negative for hematuria Abdominal: negative for nausea, vomiting, diarrhea, bright red blood per rectum, melena, or hematemesis Neurologic: negative for visual changes, syncope, or dizziness All other systems reviewed and are otherwise negative except as noted above.    Blood pressure 130/62, pulse 84, height 5\' 4"  (1.626 m), weight 164 lb 6.4 oz (74.571 kg), SpO2 96 %.  General appearance: alert and no distress Neck: no adenopathy, no JVD, supple, symmetrical, trachea midline, thyroid not enlarged, symmetric, no tenderness/mass/nodules and Loud bilateral carotid bruits Lungs: clear to auscultation bilaterally Heart: regular rate and rhythm, S1, S2 normal, no murmur, click, rub or gallop Extremities: extremities normal, atraumatic, no cyanosis or edema and Nonhealing ulcer lateral aspect of her left foot  EKG not performed today  ASSESSMENT AND PLAN:   PAD (peripheral artery disease) (HCC) [name] [name] returns today for follow-up of her peripheral arterial disease. She has rest pain at this point with a nonhealing ulcer on her left foot. Her left lower extremity Dopplers performed 10/18/14 revealed a right ABI 0.49 and a left point to 5. She does have known occluded SFAs bilaterally probably not pertinent his intractable with high-grade common femoral disease. She also has diabetes with diabetic neuropathy. At this point we'll proceed with angiography to further define her anatomy in anticipation of potential femoropopliteal  bypass grafting.      Runell Gess MD FACP,FACC,FAHA, [facility] 08/18/2015 10:44 AM

## 2015-08-18 NOTE — Telephone Encounter (Signed)
Pt saw Dr. Allyson Sabal today

## 2015-08-21 ENCOUNTER — Other Ambulatory Visit: Payer: Self-pay | Admitting: Cardiovascular Disease

## 2015-08-21 DIAGNOSIS — I739 Peripheral vascular disease, unspecified: Secondary | ICD-10-CM

## 2015-08-23 ENCOUNTER — Telehealth: Payer: Self-pay | Admitting: Cardiovascular Disease

## 2015-08-23 NOTE — Telephone Encounter (Signed)
New message     The pt is coming in the morning for a doppler study and she was wondering if she should go ahead and get her lab work done tomorrow while she is there.   The pt needs to be called back so she knows what to do please

## 2015-08-23 NOTE — Telephone Encounter (Signed)
Patient notified she can have labs done tomorrow. She is aware she will NOT be fasting.

## 2015-08-24 ENCOUNTER — Other Ambulatory Visit: Payer: Self-pay | Admitting: Cardiovascular Disease

## 2015-08-24 ENCOUNTER — Telehealth: Payer: Self-pay | Admitting: *Deleted

## 2015-08-24 ENCOUNTER — Ambulatory Visit (HOSPITAL_COMMUNITY)
Admission: RE | Admit: 2015-08-24 | Discharge: 2015-08-24 | Disposition: A | Discharge status: Home / Self Care | Payer: Medicare Other | Source: Ambulatory Visit | Attending: Cardiology | Admitting: Cardiology

## 2015-08-24 DIAGNOSIS — I70203 Unspecified atherosclerosis of native arteries of extremities, bilateral legs: Secondary | ICD-10-CM | POA: Diagnosis not present

## 2015-08-24 DIAGNOSIS — E785 Hyperlipidemia, unspecified: Secondary | ICD-10-CM | POA: Insufficient documentation

## 2015-08-24 DIAGNOSIS — I739 Peripheral vascular disease, unspecified: Secondary | ICD-10-CM

## 2015-08-24 DIAGNOSIS — G629 Polyneuropathy, unspecified: Secondary | ICD-10-CM | POA: Diagnosis not present

## 2015-08-24 DIAGNOSIS — Z79899 Other long term (current) drug therapy: Secondary | ICD-10-CM | POA: Diagnosis not present

## 2015-08-24 DIAGNOSIS — E119 Type 2 diabetes mellitus without complications: Secondary | ICD-10-CM | POA: Insufficient documentation

## 2015-08-24 DIAGNOSIS — Z01818 Encounter for other preprocedural examination: Secondary | ICD-10-CM | POA: Diagnosis not present

## 2015-08-24 DIAGNOSIS — I10 Essential (primary) hypertension: Secondary | ICD-10-CM | POA: Diagnosis not present

## 2015-08-24 DIAGNOSIS — I2510 Atherosclerotic heart disease of native coronary artery without angina pectoris: Secondary | ICD-10-CM | POA: Insufficient documentation

## 2015-08-24 DIAGNOSIS — D689 Coagulation defect, unspecified: Secondary | ICD-10-CM | POA: Diagnosis not present

## 2015-08-24 NOTE — Telephone Encounter (Signed)
Received information from Vascular that Dr Allyson Sabal requests pt's procedure to be changed to Monday July 31.   Called cath lab and had procedure rescheduled for August 28, 2015 at 0800 and pt to arrive at 0600.  Patient still in clinic. Information given to patient with date and time change. She verbalized understanding and date and time change and to still follow instructions as previously given to her.

## 2015-08-25 LAB — CBC WITH DIFFERENTIAL/PLATELET
BASOS ABS: 0 {cells}/uL (ref 0–200)
Basophils Relative: 0 %
EOS ABS: 306 {cells}/uL (ref 15–500)
EOS PCT: 3 %
HCT: 36.8 % (ref 35.0–45.0)
Hemoglobin: 12.7 g/dL (ref 11.7–15.5)
LYMPHS ABS: 2142 {cells}/uL (ref 850–3900)
Lymphocytes Relative: 21 %
MCH: 30 pg (ref 27.0–33.0)
MCHC: 34.5 g/dL (ref 32.0–36.0)
MCV: 87 fL (ref 80.0–100.0)
MPV: 8.8 fL (ref 7.5–12.5)
Monocytes Absolute: 714 cells/uL (ref 200–950)
Monocytes Relative: 7 %
NEUTROS ABS: 7038 {cells}/uL (ref 1500–7800)
Neutrophils Relative %: 69 %
PLATELETS: 317 10*3/uL (ref 140–400)
RBC: 4.23 MIL/uL (ref 3.80–5.10)
RDW: 12.9 % (ref 11.0–15.0)
WBC: 10.2 10*3/uL (ref 3.8–10.8)

## 2015-08-25 LAB — BASIC METABOLIC PANEL
BUN: 15 mg/dL (ref 7–25)
CHLORIDE: 103 mmol/L (ref 98–110)
CO2: 25 mmol/L (ref 20–31)
Calcium: 9.1 mg/dL (ref 8.6–10.4)
Creat: 0.61 mg/dL (ref 0.50–0.99)
Glucose, Bld: 241 mg/dL — ABNORMAL HIGH (ref 65–99)
Potassium: 5 mmol/L (ref 3.5–5.3)
SODIUM: 139 mmol/L (ref 135–146)

## 2015-08-25 LAB — APTT: aPTT: 27 s (ref 22–34)

## 2015-08-25 LAB — PROTIME-INR
INR: 1
Prothrombin Time: 10.7 s (ref 9.0–11.5)

## 2015-08-25 LAB — TSH: TSH: 1.05 m[IU]/L

## 2015-08-28 ENCOUNTER — Encounter (HOSPITAL_COMMUNITY)
Admission: RE | Disposition: A | Discharge status: Home / Self Care | Payer: Self-pay | Source: Ambulatory Visit | Attending: Cardiovascular Disease

## 2015-08-28 ENCOUNTER — Encounter (HOSPITAL_COMMUNITY): Payer: Self-pay | Admitting: General Practice

## 2015-08-28 ENCOUNTER — Ambulatory Visit (HOSPITAL_COMMUNITY)
Admission: RE | Admit: 2015-08-28 | Discharge: 2015-08-29 | Disposition: A | Discharge status: Home / Self Care | Payer: Medicare Other | Source: Ambulatory Visit | Attending: Cardiovascular Disease | Admitting: Cardiovascular Disease

## 2015-08-28 DIAGNOSIS — I252 Old myocardial infarction: Secondary | ICD-10-CM | POA: Insufficient documentation

## 2015-08-28 DIAGNOSIS — Z794 Long term (current) use of insulin: Secondary | ICD-10-CM | POA: Insufficient documentation

## 2015-08-28 DIAGNOSIS — I70211 Atherosclerosis of native arteries of extremities with intermittent claudication, right leg: Secondary | ICD-10-CM | POA: Diagnosis not present

## 2015-08-28 DIAGNOSIS — Z7984 Long term (current) use of oral hypoglycemic drugs: Secondary | ICD-10-CM | POA: Diagnosis not present

## 2015-08-28 DIAGNOSIS — E11621 Type 2 diabetes mellitus with foot ulcer: Secondary | ICD-10-CM | POA: Insufficient documentation

## 2015-08-28 DIAGNOSIS — Z7982 Long term (current) use of aspirin: Secondary | ICD-10-CM | POA: Diagnosis not present

## 2015-08-28 DIAGNOSIS — M1990 Unspecified osteoarthritis, unspecified site: Secondary | ICD-10-CM | POA: Insufficient documentation

## 2015-08-28 DIAGNOSIS — E785 Hyperlipidemia, unspecified: Secondary | ICD-10-CM | POA: Diagnosis present

## 2015-08-28 DIAGNOSIS — E119 Type 2 diabetes mellitus without complications: Secondary | ICD-10-CM

## 2015-08-28 DIAGNOSIS — I10 Essential (primary) hypertension: Secondary | ICD-10-CM | POA: Insufficient documentation

## 2015-08-28 DIAGNOSIS — I2510 Atherosclerotic heart disease of native coronary artery without angina pectoris: Secondary | ICD-10-CM | POA: Diagnosis present

## 2015-08-28 DIAGNOSIS — I70245 Atherosclerosis of native arteries of left leg with ulceration of other part of foot: Secondary | ICD-10-CM | POA: Insufficient documentation

## 2015-08-28 DIAGNOSIS — I700 Atherosclerosis of aorta: Secondary | ICD-10-CM | POA: Diagnosis not present

## 2015-08-28 DIAGNOSIS — E1140 Type 2 diabetes mellitus with diabetic neuropathy, unspecified: Secondary | ICD-10-CM | POA: Diagnosis not present

## 2015-08-28 DIAGNOSIS — Z87891 Personal history of nicotine dependence: Secondary | ICD-10-CM | POA: Diagnosis not present

## 2015-08-28 DIAGNOSIS — I70229 Atherosclerosis of native arteries of extremities with rest pain, unspecified extremity: Secondary | ICD-10-CM | POA: Diagnosis present

## 2015-08-28 DIAGNOSIS — L97529 Non-pressure chronic ulcer of other part of left foot with unspecified severity: Secondary | ICD-10-CM | POA: Diagnosis not present

## 2015-08-28 DIAGNOSIS — I70222 Atherosclerosis of native arteries of extremities with rest pain, left leg: Secondary | ICD-10-CM | POA: Diagnosis not present

## 2015-08-28 DIAGNOSIS — E1151 Type 2 diabetes mellitus with diabetic peripheral angiopathy without gangrene: Secondary | ICD-10-CM | POA: Insufficient documentation

## 2015-08-28 DIAGNOSIS — Z7902 Long term (current) use of antithrombotics/antiplatelets: Secondary | ICD-10-CM | POA: Diagnosis not present

## 2015-08-28 DIAGNOSIS — Z880 Allergy status to penicillin: Secondary | ICD-10-CM | POA: Diagnosis not present

## 2015-08-28 DIAGNOSIS — Z955 Presence of coronary angioplasty implant and graft: Secondary | ICD-10-CM | POA: Diagnosis not present

## 2015-08-28 DIAGNOSIS — I998 Other disorder of circulatory system: Secondary | ICD-10-CM | POA: Diagnosis present

## 2015-08-28 DIAGNOSIS — Z91040 Latex allergy status: Secondary | ICD-10-CM | POA: Diagnosis not present

## 2015-08-28 DIAGNOSIS — Y812 Prosthetic and other implants, materials and accessory general- and plastic-surgery devices associated with adverse incidents: Secondary | ICD-10-CM | POA: Insufficient documentation

## 2015-08-28 DIAGNOSIS — T82856A Stenosis of peripheral vascular stent, initial encounter: Secondary | ICD-10-CM | POA: Insufficient documentation

## 2015-08-28 DIAGNOSIS — I739 Peripheral vascular disease, unspecified: Secondary | ICD-10-CM | POA: Diagnosis present

## 2015-08-28 HISTORY — PX: PERIPHERAL VASCULAR CATHETERIZATION: SHX172C

## 2015-08-28 HISTORY — DX: Other bursitis of hip, unspecified hip: M70.70

## 2015-08-28 HISTORY — PX: ILIAC ARTERY STENT: SHX1786

## 2015-08-28 HISTORY — DX: Peripheral vascular disease, unspecified: I73.9

## 2015-08-28 HISTORY — PX: OTHER SURGICAL HISTORY: SHX169

## 2015-08-28 HISTORY — DX: Migraine, unspecified, not intractable, without status migrainosus: G43.909

## 2015-08-28 HISTORY — DX: Type 2 diabetes mellitus without complications: E11.9

## 2015-08-28 LAB — GLUCOSE, CAPILLARY
GLUCOSE-CAPILLARY: 198 mg/dL — AB (ref 65–99)
Glucose-Capillary: 230 mg/dL — ABNORMAL HIGH (ref 65–99)
Glucose-Capillary: 207 mg/dL — ABNORMAL HIGH (ref 65–99)
Glucose-Capillary: 226 mg/dL — ABNORMAL HIGH (ref 65–99)

## 2015-08-28 LAB — POCT ACTIVATED CLOTTING TIME: ACTIVATED CLOTTING TIME: 164 s

## 2015-08-28 SURGERY — LOWER EXTREMITY ANGIOGRAPHY
Anesthesia: LOCAL

## 2015-08-28 MED ORDER — OXYCODONE-ACETAMINOPHEN 5-325 MG PO TABS
1.0000 | ORAL_TABLET | ORAL | Status: DC | PRN
  Administered 2015-08-28 – 2015-08-29 (×4): 1 via ORAL
  Filled 2015-08-28 (×4): qty 1

## 2015-08-28 MED ORDER — PANTOPRAZOLE SODIUM 40 MG PO TBEC
40.0000 mg | DELAYED_RELEASE_TABLET | Freq: Every day | ORAL | Status: DC
  Administered 2015-08-28 – 2015-08-29 (×2): 40 mg via ORAL
  Filled 2015-08-28 (×2): qty 1

## 2015-08-28 MED ORDER — ASPIRIN 81 MG PO CHEW
81.0000 mg | CHEWABLE_TABLET | ORAL | Status: AC
  Administered 2015-08-28: 81 mg via ORAL

## 2015-08-28 MED ORDER — LIDOCAINE HCL (PF) 1 % IJ SOLN
INTRAMUSCULAR | Status: AC
  Filled 2015-08-28: qty 30

## 2015-08-28 MED ORDER — MIDAZOLAM HCL 2 MG/2ML IJ SOLN
INTRAMUSCULAR | Status: DC | PRN
  Administered 2015-08-28 (×2): 1 mg via INTRAVENOUS

## 2015-08-28 MED ORDER — CLOPIDOGREL BISULFATE 300 MG PO TABS
ORAL_TABLET | ORAL | Status: DC | PRN
  Administered 2015-08-28: 300 mg via ORAL

## 2015-08-28 MED ORDER — ASPIRIN 81 MG PO CHEW
CHEWABLE_TABLET | ORAL | Status: AC
  Administered 2015-08-28: 81 mg via ORAL
  Filled 2015-08-28: qty 1

## 2015-08-28 MED ORDER — NITROGLYCERIN 1 MG/10 ML FOR IR/CATH LAB
INTRA_ARTERIAL | Status: DC | PRN
  Administered 2015-08-28: 200 ug

## 2015-08-28 MED ORDER — LIDOCAINE HCL (PF) 1 % IJ SOLN
INTRAMUSCULAR | Status: DC | PRN
  Administered 2015-08-28: 60 mL

## 2015-08-28 MED ORDER — ONDANSETRON HCL 4 MG/2ML IJ SOLN
4.0000 mg | Freq: Four times a day (QID) | INTRAMUSCULAR | Status: DC | PRN
  Administered 2015-08-28: 4 mg via INTRAVENOUS
  Filled 2015-08-28: qty 2

## 2015-08-28 MED ORDER — ASPIRIN EC 81 MG PO TBEC: 81.0000 mg | DELAYED_RELEASE_TABLET | Freq: Every day | ORAL | Status: DC

## 2015-08-28 MED ORDER — MUPIROCIN CALCIUM 2 % EX CREA
TOPICAL_CREAM | Freq: Every day | CUTANEOUS | Status: DC
  Administered 2015-08-28: 15:00:00 via TOPICAL
  Administered 2015-08-29: 1 via TOPICAL
  Filled 2015-08-28: qty 15

## 2015-08-28 MED ORDER — HEPARIN (PORCINE) IN NACL 2-0.9 UNIT/ML-% IJ SOLN
INTRAMUSCULAR | Status: AC
  Filled 2015-08-28: qty 1000

## 2015-08-28 MED ORDER — MIDAZOLAM HCL 2 MG/2ML IJ SOLN
INTRAMUSCULAR | Status: AC
  Filled 2015-08-28: qty 2

## 2015-08-28 MED ORDER — GABAPENTIN 300 MG PO CAPS
300.0000 mg | ORAL_CAPSULE | Freq: Two times a day (BID) | ORAL | Status: DC
Start: 2015-08-28 — End: 2015-08-29
  Administered 2015-08-28 – 2015-08-29 (×3): 300 mg via ORAL
  Filled 2015-08-28 (×3): qty 1

## 2015-08-28 MED ORDER — METOPROLOL SUCCINATE ER 50 MG PO TB24
50.0000 mg | ORAL_TABLET | Freq: Every day | ORAL | Status: DC
  Administered 2015-08-28 – 2015-08-29 (×2): 50 mg via ORAL
  Filled 2015-08-28 (×2): qty 1

## 2015-08-28 MED ORDER — ACETAMINOPHEN 325 MG PO TABS: 650.0000 mg | ORAL_TABLET | ORAL | Status: DC | PRN

## 2015-08-28 MED ORDER — SODIUM CHLORIDE 0.9 % IV SOLN
INTRAVENOUS | Status: AC
  Administered 2015-08-28: 10:00:00 via INTRAVENOUS

## 2015-08-28 MED ORDER — AMLODIPINE BESYLATE 5 MG PO TABS
5.0000 mg | ORAL_TABLET | Freq: Every day | ORAL | Status: DC
  Administered 2015-08-28 – 2015-08-29 (×2): 5 mg via ORAL
  Filled 2015-08-28 (×2): qty 1

## 2015-08-28 MED ORDER — ATORVASTATIN CALCIUM 80 MG PO TABS
80.0000 mg | ORAL_TABLET | Freq: Every day | ORAL | Status: DC
  Administered 2015-08-28: 18:00:00 80 mg via ORAL
  Filled 2015-08-28: qty 1

## 2015-08-28 MED ORDER — SODIUM CHLORIDE 0.9 % WEIGHT BASED INFUSION
3.0000 mL/kg/h | INTRAVENOUS | Status: DC
  Administered 2015-08-28: 3 mL/kg/h via INTRAVENOUS

## 2015-08-28 MED ORDER — ASPIRIN EC 81 MG PO TBEC
81.0000 mg | DELAYED_RELEASE_TABLET | Freq: Every day | ORAL | Status: DC
  Administered 2015-08-29: 10:00:00 81 mg via ORAL
  Filled 2015-08-28 (×2): qty 1

## 2015-08-28 MED ORDER — NITROGLYCERIN IN D5W 200-5 MCG/ML-% IV SOLN
INTRAVENOUS | Status: AC
  Filled 2015-08-28: qty 250

## 2015-08-28 MED ORDER — INSULIN ASPART 100 UNIT/ML ~~LOC~~ SOLN
0.0000 [IU] | Freq: Three times a day (TID) | SUBCUTANEOUS | Status: DC
  Administered 2015-08-28: 3 [IU] via SUBCUTANEOUS
  Administered 2015-08-28: 5 [IU] via SUBCUTANEOUS
  Administered 2015-08-29: 11 [IU] via SUBCUTANEOUS
  Administered 2015-08-29: 5 [IU] via SUBCUTANEOUS

## 2015-08-28 MED ORDER — FENTANYL CITRATE (PF) 100 MCG/2ML IJ SOLN
INTRAMUSCULAR | Status: DC | PRN
  Administered 2015-08-28 (×4): 25 ug via INTRAVENOUS

## 2015-08-28 MED ORDER — INSULIN GLARGINE 100 UNIT/ML ~~LOC~~ SOLN
35.0000 [IU] | Freq: Every day | SUBCUTANEOUS | Status: DC
  Administered 2015-08-28: 35 [IU] via SUBCUTANEOUS
  Filled 2015-08-28 (×2): qty 0.35

## 2015-08-28 MED ORDER — HEPARIN SODIUM (PORCINE) 1000 UNIT/ML IJ SOLN
INTRAMUSCULAR | Status: AC
  Filled 2015-08-28: qty 1

## 2015-08-28 MED ORDER — HEPARIN SODIUM (PORCINE) 1000 UNIT/ML IJ SOLN
INTRAMUSCULAR | Status: DC | PRN
  Administered 2015-08-28: 3000 [IU] via INTRAVENOUS
  Administered 2015-08-28: 2000 [IU] via INTRAVENOUS
  Administered 2015-08-28: 4000 [IU] via INTRAVENOUS

## 2015-08-28 MED ORDER — ISOSORBIDE MONONITRATE ER 60 MG PO TB24
90.0000 mg | ORAL_TABLET | Freq: Every day | ORAL | Status: DC
  Administered 2015-08-28 – 2015-08-29 (×2): 90 mg via ORAL
  Filled 2015-08-28 (×2): qty 2

## 2015-08-28 MED ORDER — VERAPAMIL HCL 2.5 MG/ML IV SOLN
INTRAVENOUS | Status: AC
  Filled 2015-08-28: qty 2

## 2015-08-28 MED ORDER — SODIUM CHLORIDE 0.9 % WEIGHT BASED INFUSION: 1.0000 mL/kg/h | INTRAVENOUS | Status: DC

## 2015-08-28 MED ORDER — CLOPIDOGREL BISULFATE 75 MG PO TABS
75.0000 mg | ORAL_TABLET | Freq: Every day | ORAL | Status: DC
  Administered 2015-08-29: 75 mg via ORAL
  Filled 2015-08-28: qty 1

## 2015-08-28 MED ORDER — HEPARIN (PORCINE) IN NACL 2-0.9 UNIT/ML-% IJ SOLN
INTRAMUSCULAR | Status: DC | PRN
  Administered 2015-08-28: 1000 mL

## 2015-08-28 MED ORDER — CLOPIDOGREL BISULFATE 75 MG PO TABS: 75.0000 mg | ORAL_TABLET | Freq: Every day | ORAL | Status: DC

## 2015-08-28 MED ORDER — CLOPIDOGREL BISULFATE 300 MG PO TABS
ORAL_TABLET | ORAL | Status: AC
  Filled 2015-08-28: qty 1

## 2015-08-28 MED ORDER — IODIXANOL 320 MG/ML IV SOLN
INTRAVENOUS | Status: DC | PRN
  Administered 2015-08-28: 225 mL via INTRAVENOUS

## 2015-08-28 MED ORDER — MORPHINE SULFATE (PF) 2 MG/ML IV SOLN
2.0000 mg | INTRAVENOUS | Status: DC | PRN
  Administered 2015-08-28 – 2015-08-29 (×7): 2 mg via INTRAVENOUS
  Filled 2015-08-28 (×7): qty 1

## 2015-08-28 MED ORDER — LOSARTAN POTASSIUM 50 MG PO TABS
100.0000 mg | ORAL_TABLET | Freq: Every day | ORAL | Status: DC
  Administered 2015-08-28 – 2015-08-29 (×2): 100 mg via ORAL
  Filled 2015-08-28 (×2): qty 2

## 2015-08-28 MED ORDER — SODIUM CHLORIDE 0.9% FLUSH: 3.0000 mL | INTRAVENOUS | Status: DC | PRN

## 2015-08-28 MED ORDER — METFORMIN HCL 500 MG PO TABS
1000.0000 mg | ORAL_TABLET | Freq: Two times a day (BID) | ORAL | Status: DC
Start: 2015-08-31 — End: 2015-08-29

## 2015-08-28 MED ORDER — FENTANYL CITRATE (PF) 100 MCG/2ML IJ SOLN
INTRAMUSCULAR | Status: AC
  Filled 2015-08-28: qty 2

## 2015-08-28 MED ORDER — HYDRALAZINE HCL 20 MG/ML IJ SOLN: 10.0000 mg | Freq: Four times a day (QID) | INTRAMUSCULAR | Status: DC | PRN

## 2015-08-28 SURGICAL SUPPLY — 20 items
BALLN ANGIOSCULPT OTW 5.0X20 (BALLOONS) ×3
BALLOON ANGIOSCULPT OTW 5.0X20 (BALLOONS) IMPLANT
CATH ANGIO 5F PIGTAIL 65CM (CATHETERS) ×1 IMPLANT
CATH STRAIGHT 5FR 65CM (CATHETERS) ×1 IMPLANT
DEVICE CONTINUOUS FLUSH (MISCELLANEOUS) ×1 IMPLANT
DIAMONDBACK SOLID OAS 1.5MM (CATHETERS) ×3
KIT ENCORE 26 ADVANTAGE (KITS) ×1 IMPLANT
KIT PV (KITS) ×3 IMPLANT
SHEATH BRITE TIP 6FR 35CM (SHEATH) ×1 IMPLANT
SHEATH PINNACLE 5F 10CM (SHEATH) ×1 IMPLANT
SHEATH PINNACLE 6F 10CM (SHEATH) ×1 IMPLANT
STENT ABSOLUTE PRO 8X40X135 (Permanent Stent) ×1 IMPLANT
SYRINGE MEDRAD AVANTA MACH 7 (SYRINGE) ×1 IMPLANT
SYSTEM DIMNDBCK SLD OAS 1.5MM (CATHETERS) IMPLANT
TAPE RADIOPAQUE TURBO (MISCELLANEOUS) ×1 IMPLANT
TRANSDUCER W/STOPCOCK (MISCELLANEOUS) ×3 IMPLANT
TRAY PV CATH (CUSTOM PROCEDURE TRAY) ×3 IMPLANT
WIRE HITORQ VERSACORE ST 145CM (WIRE) ×1 IMPLANT
WIRE VERSACORE LOC 115CM (WIRE) ×1 IMPLANT
WIRE VIPER ADVANCE .017X335CM (WIRE) ×1 IMPLANT

## 2015-08-28 NOTE — H&P (View-Only) (Signed)
08/18/2015 [name] [name]   27-Jul-1950  [id number]  Primary Physician Georgann Housekeeper, MD Primary Cardiologist: Runell Gess MD Roseanne Reno  HPI:  [name] [name] is a 65 year old Caucasian female patient of Dr. Landry Dyke with a history of CAD and PAD. I last saw [name] [name] at the time of [name] [name] previous angiogram 01/03/15. She has had right common and external iliac artery stenting as well as diamondback atherectomy of [name] [name] right SFA, PTA and stenting of that vessel as well with known occluded left SFA. She's had redilatation of [name] [name] right SFA for "in-stent restenosis. I re-angiogramed 06/04/11 Demonstrating a patent right common iliac artery stent, an occluded right SFA stent, 60% proximal left external iliac artery stenosis with a 23 mm gradient, 80% left common femoral artery stenosis. She has severe lifestyle including claudication as well as rest pain on the left with recent Dopplers performed 10/26/14 revealing a right ABI 0.49 and a left upper and 25. We decide to proceed with angiography and potential intervention of [name] [name] left external iliac artery. She may ultimately require left common femoral endarterectomy and patch angioplasty plus or minus femoropopliteal bypass grafting.[name] [name] other problems include history of CAD status post circumflex stenting and at the time of a myocardial infarction. She has a history of hypertension, diabetes and hyperlipidemia as well. She denies chest pain or shortness of breath. She has severe lifestyle limiting claudication, and now has rest pain with a nonhealing ulcer on [name] [name] left foot. [name] [name] left lower extremity arterial Doppler studies performed 10/26/14 showed a right ABI 0.49 and a left of 0.25. [name] [name] SFAs were occluded and she had high-grade common femoral disease bilaterally.    Current Outpatient Prescriptions  Medication Sig Dispense Refill  . amLODipine (NORVASC) 5 MG tablet TAKE 1 TABLET(5 MG) BY MOUTH DAILY 30 tablet 11  . aspirin EC 81 MG tablet Take 81 mg by  mouth daily.    Marland Kitchen atorvastatin (LIPITOR) 80 MG tablet Take 1 tablet (80 mg total) by mouth daily. 90 tablet 1  . clopidogrel (PLAVIX) 75 MG tablet Take 1 tablet (75 mg total) by mouth daily. 30 tablet 5  . gabapentin (NEURONTIN) 100 MG capsule Take 100 mg by mouth 2 (two) times daily.  3  . Insulin Glargine (LANTUS) 100 UNIT/ML Solostar Pen Inject 35 Units into the skin daily at 10 pm.     . isosorbide mononitrate (IMDUR) 60 MG 24 hr tablet Take 1.5 tablets by mouth once daily. <please make appointment> (Patient taking differently: Take 90 mg by mouth daily. ) 45 tablet 6  . losartan (COZAAR) 100 MG tablet TAKE 1 TABLET BY MOUTH EVERY DAY 30 tablet 11  . metFORMIN (GLUCOPHAGE) 1000 MG tablet Take 1,000 mg by mouth 2 (two) times daily with a meal.    . metoprolol succinate (TOPROL-XL) 50 MG 24 hr tablet TAKE 1 TABLET(50 MG) BY MOUTH DAILY 30 tablet 0  . pantoprazole (PROTONIX) 40 MG tablet Take 1 tablet (40 mg total) by mouth daily. 30 tablet 10   No current facility-administered medications for this visit.    Allergies  Allergen Reactions  . Hydrochlorothiazide   . Latex Rash  . Penicillins Swelling and Rash    Has patient had a PCN reaction causing immediate rash, facial/tongue/throat swelling, SOB or lightheadedness with hypotension: Yes Has patient had a PCN reaction causing severe rash involving mucus membranes or skin necrosis: No Has patient had a PCN reaction that required hospitalization No Has patient had a PCN reaction occurring  within the last 10 years: No If all of the above answers are "NO", then may proceed with Cephalosporin use.     Social History   Social History  . Marital Status: Married    Spouse Name: N/A  . Number of Children: N/A  . Years of Education: N/A   Occupational History  . Not on file.   Social History Main Topics  . Smoking status: Former Smoker    Types: Cigarettes    Quit date: 10/04/2009  . Smokeless tobacco: Never Used  . Alcohol Use: No   . Drug Use: No  . Sexual Activity: Yes    Birth Control/ Protection: Surgical   Other Topics Concern  . Not on file   Social History Narrative     Review of Systems: General: negative for chills, fever, night sweats or weight changes.  Cardiovascular: negative for chest pain, dyspnea on exertion, edema, orthopnea, palpitations, paroxysmal nocturnal dyspnea or shortness of breath Dermatological: negative for rash Respiratory: negative for cough or wheezing Urologic: negative for hematuria Abdominal: negative for nausea, vomiting, diarrhea, bright red blood per rectum, melena, or hematemesis Neurologic: negative for visual changes, syncope, or dizziness All other systems reviewed and are otherwise negative except as noted above.    Blood pressure 130/62, pulse 84, height 5\' 4"  (1.626 m), weight 164 lb 6.4 oz (74.571 kg), SpO2 96 %.  General appearance: alert and no distress Neck: no adenopathy, no JVD, supple, symmetrical, trachea midline, thyroid not enlarged, symmetric, no tenderness/mass/nodules and Loud bilateral carotid bruits Lungs: clear to auscultation bilaterally Heart: regular rate and rhythm, S1, S2 normal, no murmur, click, rub or gallop Extremities: extremities normal, atraumatic, no cyanosis or edema and Nonhealing ulcer lateral aspect of [name] [name] left foot  EKG not performed today  ASSESSMENT AND PLAN:   PAD (peripheral artery disease) (HCC) [name] [name] returns today for follow-up of [name] [name] peripheral arterial disease. She has rest pain at this point with a nonhealing ulcer on [name] [name] left foot. [name] [name] left lower extremity Dopplers performed 10/18/14 revealed a right ABI 0.49 and a left point to 5. She does have known occluded SFAs bilaterally probably not pertinent his intractable with high-grade common femoral disease. She also has diabetes with diabetic neuropathy. At this point we'll proceed with angiography to further define [name] [name] anatomy in anticipation of potential femoropopliteal  bypass grafting.      Runell Gess MD FACP,FACC,FAHA, [facility] 08/18/2015 10:44 AM

## 2015-08-28 NOTE — Consult Note (Signed)
 [facility] [place]  Patient name: [name] [name] MRN: [id number] DOB: 04/15/1950 Sex: female  REASON FOR CONSULT: Critical limb ischemia left lower extremity. Consult is from Dr. Jonathan Berry.  HPI: [name] [name] is a 65 y.o. female, who underwent arteriography today by Dr. Jonathan Berry. She gives a long history of bilateral lower extremity claudication which has been more significant on the left side. She experiences pain in both calves and thighs and hips which is brought on by ambulation and relieved with rest. This occurs at a very short distance. She developed rest pain in her left foot approximately 3-4 weeks ago.She also states that she's had a wound on the lateral aspect of her left fifth toe for about 2 weeks.  She underwent an arteriogram today which showed multilevel arterial occlusive disease bilaterally. Given the limb threatening situation of the left lower extremity vascular surgery was consulted.  Her risk factors for peripheral vascular disease include diabetes, hypertension, hyperlipidemia, and history of tobacco use in the past.  She had a myocardial infarction in 2006. She had a PTCA at that time and has had no problems since then. She is followed by Dr. Tom Kelley and Dr. Jonathan Berry. She denies any recent chest pain or chest pressure.   Past Medical History:  Diagnosis Date  . Arthritis   . Complication of anesthesia    DIFFICULT WAKING  . Coronary artery disease   . Diabetes mellitus   . Headache(784.0)   . Hyperlipidemia LDL goal < 70 06/28/2013  . Hypertension   . Myocardial infarction (HCC)   . Neuromuscular disorder (HCC)    DIABETIC NEUROPATHY  . Peripheral vascular disease (HCC)     Family History  Problem Relation Age of Onset  . Hypertension Mother   . Heart failure Mother   . Heart failure Father   . Stroke Father   . Diabetes Father    There is no family history of premature cardiovascular disease.  SOCIAL HISTORY:  She is married. She has one child. She quit tobacco in 2006.  Social History   Social History  . Marital status: Married    Spouse name: N/A  . Number of children: N/A  . Years of education: N/A   Occupational History  . Not on file.   Social History Main Topics  . Smoking status: Former Smoker    Types: Cigarettes    Quit date: 10/04/2009  . Smokeless tobacco: Never Used  . Alcohol use No  . Drug use: No  . Sexual activity: Yes    Birth control/ protection: Surgical   Other Topics Concern  . Not on file   Social History Narrative  . No narrative on file    Allergies  Allergen Reactions  . Hydrochlorothiazide   . Latex Rash  . Penicillins Swelling and Rash    Has patient had a PCN reaction causing immediate rash, facial/tongue/throat swelling, SOB or lightheadedness with hypotension: Yes Has patient had a PCN reaction causing severe rash involving mucus membranes or skin necrosis: No Has patient had a PCN reaction that required hospitalization No Has patient had a PCN reaction occurring within the last 10 years: No If all of the above answers are "NO", then may proceed with Cephalosporin use.     Current Facility-Administered Medications  Medication Dose Route Frequency Provider Last Rate Last Dose  . 0.9 %  sodium chloride infusion   Intravenous Continuous Jonathan J Berry, MD      .   acetaminophen (TYLENOL) tablet 650 mg  650 mg Oral Q4H PRN Jonathan J Berry, MD      . amLODipine (NORVASC) tablet 5 mg  5 mg Oral Daily Jonathan J Berry, MD      . [START ON 08/29/2015] aspirin EC tablet 81 mg  81 mg Oral Daily Jonathan J Berry, MD      . atorvastatin (LIPITOR) tablet 80 mg  80 mg Oral q1800 Jonathan J Berry, MD      . [START ON 08/29/2015] clopidogrel (PLAVIX) tablet 75 mg  75 mg Oral Q breakfast Jonathan J Berry, MD      . gabapentin (NEURONTIN) capsule 300 mg  300 mg Oral BID Jonathan J Berry, MD      . hydrALAZINE (APRESOLINE) injection 10 mg  10 mg Intravenous Q6H PRN  Jonathan J Berry, MD      . insulin glargine (LANTUS) injection 35 Units  35 Units Subcutaneous Q2200 Jonathan J Berry, MD      . isosorbide mononitrate (IMDUR) 24 hr tablet 90 mg  90 mg Oral Daily Jonathan J Berry, MD      . losartan (COZAAR) tablet 100 mg  100 mg Oral Daily Jonathan J Berry, MD      . [START ON 08/31/2015] metFORMIN (GLUCOPHAGE) tablet 1,000 mg  1,000 mg Oral BID WC Jonathan J Berry, MD      . metoprolol succinate (TOPROL-XL) 24 hr tablet 50 mg  50 mg Oral Daily Jonathan J Berry, MD      . morphine 2 MG/ML injection 2 mg  2 mg Intravenous Q1H PRN Jonathan J Berry, MD      . ondansetron (ZOFRAN) injection 4 mg  4 mg Intravenous Q6H PRN Jonathan J Berry, MD      . pantoprazole (PROTONIX) EC tablet 40 mg  40 mg Oral Daily Jonathan J Berry, MD        REVIEW OF SYSTEMS:  [X] denotes positive finding, [ ] denotes negative finding Cardiac  Comments:  Chest pain or chest pressure:    Shortness of breath upon exertion: X   Short of breath when lying flat:    Irregular heart rhythm:        Vascular    Pain in calf, thigh, or hip brought on by ambulation: X Bilateral calf thigh and hip.  Pain in feet at night that wakes you up from your sleep:  X Left foot  Blood clot in your veins:    Leg swelling:         Pulmonary    Oxygen at home:    Productive cough:     Wheezing:         Neurologic    Sudden weakness in arms or legs:     Sudden numbness in arms or legs:     Sudden onset of difficulty speaking or slurred speech:    Temporary loss of vision in one eye:     Problems with dizziness:         Gastrointestinal    Blood in stool:     Vomited blood:         Genitourinary    Burning when urinating:     Blood in urine:        Psychiatric    Major depression:         Hematologic    Bleeding problems:    Problems with blood clotting too easily:        Skin    Rashes or   ulcers: X Left fifth toe      Constitutional    Fever or chills:      PHYSICAL  EXAM: Vitals:   08/28/15 0925 08/28/15 0930 08/28/15 0935 08/28/15 0935  BP: (!) 204/101 (!) 198/98  (!) 193/107  Pulse: 95 92 (!) 118 96  Resp: 12 (!) 9 14 (!) 0  Temp:      TempSrc:      SpO2: 98% 98% 98% 99%  Weight:      Height:        GENERAL: The patient is a well-nourished female, in no acute distress. The vital signs are documented above. CARDIAC: There is a regular rate and rhythm.  VASCULAR: I do not detect carotid bruits. She has dressings on both groins and I cannot assess her femoral pulses. I cannot palpate popliteal or pedal pulses. She has mild bilateral lower extremity swelling more significant on the right side. PULMONARY: There is good air exchange bilaterally without wheezing or rales. ABDOMEN: Soft and non-tender with normal pitched bowel sounds.  MUSCULOSKELETAL: There are no major deformities or cyanosis. NEUROLOGIC: No focal weakness or paresthesias are detected. SKIN: There are no ulcers or rashes noted. PSYCHIATRIC: The patient has a normal affect.  DATA:   ARTERIOGRAM: I have reviewed her arteriogram that was performed today. She has single renal arteries bilaterally with no significant renal artery stenosis. The infrarenal aorta is widely patent.  On the left side, which is the site of concern, there is mild disease in the proximal left common iliac artery. There was significant plaque at the bifurcation of the left common iliac artery involving the proximal hypogastric artery and proximal external iliac artery. This was successfully addressed with atherectomy, balloon angioplasty, and stenting. The catheter was essentially occlusive on the left. The common femoral artery is occluded with a patent deep femoral artery. The superficial femoral artery is occluded with poor visualization distally.  On the right side, there is extensive plaque in the common femoral artery. The superficial femoral artery is occluded. There does appear to be a patent posterior  tibial artery on the right.  ARTERIAL DOPPLER STUDY ON 08/24/2015:  ABI is 0% on the left is no Doppler signals could be obtained of the left foot. ABI on the right was 28%.  CAROTID DUPLEX: Carotid duplex scan in September 2016 showed a chronically occludedright internal carotid artery with less than 39% left carotid stenosis.   MEDICAL ISSUES:  CRITICAL LIMB ISCHEMIA LEFT LOWER EXTREMITY: This patient has multilevel arterial occlusive disease with a nonhealing wound of the left foot. This is clearly a limb threatening situation. Without attempted revascularization she would require proximal amputation. I think the best option would be left femoral endarterectomy with profundoplasty and intraoperative arteriography. If she has a distal target for bypass she could potentially also require bypass. She has not previously had her vein taken from either leg. A will look at her vein at the time of surgery. She could potentially require placement of a prosthetic graft. I have reviewed the indications for lower extremity bypass. I have also reviewed the potential complications of surgery including but not limited to: wound healing problems, infection, graft thrombosis, limb loss, or other unpredictable medical problems. All the patient's questions were answered and they are agreeable to proceed. Her surgery is scheduled for 09/05/15.     Dickson, Christopher [facility] [facility] [phone]     

## 2015-08-28 NOTE — Consult Note (Addendum)
WOC Nurse wound consult note Reason for Consult: Consult requested for left foot.  Pt is followed by a podiatrist prior to admission and states she has been using Silvadene.  She had revascularization to the LLE and is followed by the vascular team. Wound type: Left outer foot with dry yellow raised callous; 3X2cm; with loose nonviable outer layer of skin which removes easily when touched, and clipped off the remaining attached skin with scissors. There was no pain or bleeding. Measurement: After nonviable skin was removed, it revealed inner wound bed 50% red, 15% yellow, 35% dark purple-red. Drainage (amount, consistency, odor) Scant amt yellow drainage, no odor Periwound: Yellow calloused raised wound edges remain to wound edges Dressing procedure/placement/frequency: Bactroban to provide antimicrobial benefits and promote moist healing. Foam dressing to protect from further injury.  Pt can resume follow-up with her podiatrist after discharge. Discussed plan of care and she denied further questions. Please re-consult if further assistance is needed.  Thank-you,  Cammie Mcgee MSN, RN, CWOCN, [place], CNS [phone]

## 2015-08-28 NOTE — Interval H&P Note (Signed)
History and Physical Interval Note:  08/28/2015 7:44 AM  [name] [name]  has presented today for surgery, with the diagnosis of pvd  The various methods of treatment have been discussed with the patient and family. After consideration of risks, benefits and other options for treatment, the patient has consented to  Procedure(s): Lower Extremity Angiography (N/A) as a surgical intervention .  The patient's history has been reviewed, patient examined, no change in status, stable for surgery.  I have reviewed the patient's chart and labs.  Questions were answered to the patient's satisfaction.     Nanetta Batty

## 2015-08-28 NOTE — Progress Notes (Signed)
Site area: left groin  Site Prior to Removal:  Level 0  Pressure Applied For 20 MINUTES    Minutes Beginning at 1220  Manual:   Yes.    Patient Status During Pull:  stable  Post Pull Groin Site:  Level 0  Post Pull Instructions Given:  Yes.    Post Pull Pulses Present:  No.  Dressing Applied:  Yes.    Comments:  LLE is pulseless, at baseline.  Doppler can detect flow, but nonpulsatile.

## 2015-08-28 NOTE — Care Management Note (Signed)
Case Management Note  Patient Details  Name: [name] [name] MRN: [id number] Date of Birth: Sep 21, 1950  Subjective/Objective:    Patient is from home, s/p angiography, already on plavix , NCM will cont to follow for dc needs.                 Action/Plan:   Expected Discharge Date:                  Expected Discharge Plan:  Home/Self Care  In-House Referral:     Discharge planning Services  CM Consult  Post Acute Care Choice:    Choice offered to:     DME Arranged:    DME Agency:     HH Arranged:    HH Agency:     Status of Service:  In process, will continue to follow  If discussed at Long Length of Stay Meetings, dates discussed:    Additional Comments:  Leone Haven, RN 08/28/2015, 1:03 PM

## 2015-08-29 DIAGNOSIS — I2510 Atherosclerotic heart disease of native coronary artery without angina pectoris: Secondary | ICD-10-CM | POA: Diagnosis not present

## 2015-08-29 DIAGNOSIS — I998 Other disorder of circulatory system: Secondary | ICD-10-CM

## 2015-08-29 DIAGNOSIS — I252 Old myocardial infarction: Secondary | ICD-10-CM | POA: Diagnosis not present

## 2015-08-29 DIAGNOSIS — I70245 Atherosclerosis of native arteries of left leg with ulceration of other part of foot: Secondary | ICD-10-CM | POA: Diagnosis not present

## 2015-08-29 DIAGNOSIS — I10 Essential (primary) hypertension: Secondary | ICD-10-CM | POA: Diagnosis not present

## 2015-08-29 DIAGNOSIS — L97529 Non-pressure chronic ulcer of other part of left foot with unspecified severity: Secondary | ICD-10-CM | POA: Diagnosis not present

## 2015-08-29 DIAGNOSIS — I700 Atherosclerosis of aorta: Secondary | ICD-10-CM | POA: Diagnosis not present

## 2015-08-29 DIAGNOSIS — I739 Peripheral vascular disease, unspecified: Secondary | ICD-10-CM

## 2015-08-29 DIAGNOSIS — E1140 Type 2 diabetes mellitus with diabetic neuropathy, unspecified: Secondary | ICD-10-CM | POA: Diagnosis not present

## 2015-08-29 DIAGNOSIS — E1151 Type 2 diabetes mellitus with diabetic peripheral angiopathy without gangrene: Secondary | ICD-10-CM | POA: Diagnosis not present

## 2015-08-29 DIAGNOSIS — E11621 Type 2 diabetes mellitus with foot ulcer: Secondary | ICD-10-CM | POA: Diagnosis not present

## 2015-08-29 DIAGNOSIS — Z955 Presence of coronary angioplasty implant and graft: Secondary | ICD-10-CM | POA: Diagnosis not present

## 2015-08-29 DIAGNOSIS — I70211 Atherosclerosis of native arteries of extremities with intermittent claudication, right leg: Secondary | ICD-10-CM | POA: Diagnosis not present

## 2015-08-29 DIAGNOSIS — T82856A Stenosis of peripheral vascular stent, initial encounter: Secondary | ICD-10-CM | POA: Diagnosis not present

## 2015-08-29 LAB — GLUCOSE, CAPILLARY
GLUCOSE-CAPILLARY: 206 mg/dL — AB (ref 65–99)
GLUCOSE-CAPILLARY: 346 mg/dL — AB (ref 65–99)

## 2015-08-29 LAB — BASIC METABOLIC PANEL
ANION GAP: 6 (ref 5–15)
BUN: 20 mg/dL (ref 6–20)
CALCIUM: 8.9 mg/dL (ref 8.9–10.3)
CO2: 24 mmol/L (ref 22–32)
Chloride: 103 mmol/L (ref 101–111)
Creatinine, Ser: 0.78 mg/dL (ref 0.44–1.00)
GFR calc Af Amer: >60 mL/min (ref >60)
Glucose, Bld: 236 mg/dL — ABNORMAL HIGH (ref 65–99)
POTASSIUM: 4.4 mmol/L (ref 3.5–5.1)
SODIUM: 133 mmol/L — AB (ref 135–145)

## 2015-08-29 LAB — CBC
HEMATOCRIT: 31.9 % — AB (ref 36.0–46.0)
Hemoglobin: 10.8 g/dL — ABNORMAL LOW (ref 12.0–15.0)
MCH: 29.7 pg (ref 26.0–34.0)
MCHC: 33.9 g/dL (ref 30.0–36.0)
MCV: 87.6 fL (ref 78.0–100.0)
PLATELETS: 233 10*3/uL (ref 150–400)
RBC: 3.64 MIL/uL — ABNORMAL LOW (ref 3.87–5.11)
RDW: 12.7 % (ref 11.5–15.5)
WBC: 8.7 10*3/uL (ref 4.0–10.5)

## 2015-08-29 LAB — POCT ACTIVATED CLOTTING TIME
ACTIVATED CLOTTING TIME: 230 s
ACTIVATED CLOTTING TIME: 252 s
ACTIVATED CLOTTING TIME: 263 s
Activated Clotting Time: 191 seconds

## 2015-08-29 MED ORDER — CLOPIDOGREL BISULFATE 75 MG PO TABS: 75.0000 mg | ORAL_TABLET | Freq: Every day | ORAL | 3 refills | Status: DC

## 2015-08-29 MED ORDER — OXYCODONE-ACETAMINOPHEN 5-325 MG PO TABS: 1.0000 | ORAL_TABLET | ORAL | 0 refills | Status: DC | PRN

## 2015-08-29 MED FILL — Verapamil HCl IV Soln 2.5 MG/ML: INTRAVENOUS | Qty: 2 | Status: AC

## 2015-08-29 MED FILL — Nitroglycerin IV Soln 200 MCG/ML in D5W: INTRAVENOUS | Qty: 250 | Status: AC

## 2015-08-29 NOTE — Progress Notes (Signed)
Patient Name: [name] [name] Date of Encounter: 08/29/2015  Primary Cardiologist: Dr. Tresa Endo PV specialist: Dr. Allyson Sabal   Principal Problem:   Critical lower limb ischemia Active Problems:   PAD (peripheral artery disease) (HCC)   CAD (coronary artery disease): DES to dominant Circ 10/13/09   Diabetes mellitus (HCC)   Hyperlipidemia with target LDL less than 70    SUBJECTIVE  Denies any CP or SOB. Still has L leg pain worse than the right.   CURRENT MEDS . amLODipine  5 mg Oral Daily  . aspirin EC  81 mg Oral Daily  . atorvastatin  80 mg Oral q1800  . clopidogrel  75 mg Oral Q breakfast  . gabapentin  300 mg Oral BID  . insulin aspart  0-15 Units Subcutaneous TID WC  . insulin glargine  35 Units Subcutaneous Q2200  . isosorbide mononitrate  90 mg Oral Daily  . losartan  100 mg Oral Daily  . [START ON 08/31/2015] metFORMIN  1,000 mg Oral BID WC  . metoprolol succinate  50 mg Oral Daily  . mupirocin cream   Topical Daily  . pantoprazole  40 mg Oral Daily    OBJECTIVE  Vitals:   08/28/15 2351 08/29/15 0259 08/29/15 0300 08/29/15 0747  BP: (!) 127/46 (!) 153/49 (!) 153/49   Pulse:  85 79 86  Resp: Temp:  98.2 F (36.8 C)  98 F (36.7 C)  TempSrc:  Oral  Oral  SpO2:  93% 92% 95%  Weight:  166 lb 3.6 oz (75.4 kg)    Height:        Intake/Output Summary (Last 24 hours) at 08/29/15 0747 Last data filed at 08/29/15 0304  Gross per 24 hour  Intake             1275 ml  Output             2350 ml  Net            -1075 ml   Filed Weights   08/28/15 0620 08/29/15 0259  Weight: 165 lb (74.8 kg) 166 lb 3.6 oz (75.4 kg)    PHYSICAL EXAM  General: Pleasant, NAD. Neuro: Alert and oriented X 3. Moves all extremities spontaneously. Psych: Normal affect. HEENT:  Normal  Neck: Supple without bruits or JVD. Lungs:  Resp regular and unlabored, CTA. Heart: RRR no s3, s4, or murmurs. L femoral cath site stable, no bleeding or significant hematoma Abdomen: Soft,  non-tender, non-distended, BS + x 4.  Extremities: No clubbing, cyanosis or edema. DP/PT/Radials 2+ and equal bilaterally.  Accessory Clinical Findings  CBC  Recent Labs  08/29/15 0321  WBC 8.7  HGB 10.8*  HCT 31.9*  MCV 87.6  PLT 233   Basic Metabolic Panel  Recent Labs  08/29/15 0321  NA 133*  K 4.4  CL 103  CO2 24  GLUCOSE 236*  BUN 20  CREATININE 0.78  CALCIUM 8.9   TELE NSR with HR 70-80s    ECG  No new EKG  Echocardiogram 10/28/2014  LV EF: 50% -   55%  ------------------------------------------------------------------- Indications:      (R01.1).  ------------------------------------------------------------------- History:   PMH:  Acquired from the patient and from the patient&'s chart.  Murmur.  Coronary artery disease.  PMH:   Myocardial infarction.  Risk factors:  Current tobacco use. Hypertension. Diabetes mellitus. Dyslipidemia.  ------------------------------------------------------------------- Study Conclusions  - Left ventricle: Distal septal hypokinesis. The cavity size was   mildly  dilated. Wall thickness was normal. Systolic function was   normal. The estimated ejection fraction was in the range of 50%   to 55%. - Left atrium: The atrium was mildly dilated. - Atrial septum: No subcostal views.    Radiology/Studies  Dg Chest 2 View  Result Date: 08/18/2015 CLINICAL DATA:  65 year old with bilateral lower extremity swelling and shortness of breath. Preoperative evaluation prior to arteriography. EXAM: CHEST  2 VIEW COMPARISON:  03/02/2013, 05/29/2011 and earlier. FINDINGS: AP sitting erect and lateral images were obtained. Cardiac silhouette normal in size for AP technique, unchanged. Thoracic aorta atherosclerotic, unchanged. Hilar and mediastinal contours otherwise unremarkable. Lungs clear. Bronchovascular markings normal. Pulmonary vascularity normal. No visible pleural effusions. No pneumothorax. Mild degenerative changes  involving the thoracic spine. IMPRESSION: 1.  No acute cardiopulmonary disease. 2. Thoracic aortic atherosclerosis. Electronically Signed   By: Hulan Saas M.D.   On: 08/18/2015 15:40   Lower extremity angiography Procedures Performed:                      1. Abdominal aortogram/bilateral iliac angiogram/bifemoral runoff                      2. Diamondback orbital rotational arthrectomy left common iliac artery                      3. Cutting balloon atherectomy left common iliac artery                      4. Stenting left common iliac artery Angiographic Data:   1: Abdominal aortogram-the distal abdominal aorta was fluoroscopically calcified and diffusely atherosclerotic 2: Left lower extremity-there was a 95% calcified distal left common iliac artery stenosis which was occlusive with passing an 0. 35 wire. Below that there was faint filling of the profunda. The SFA popliteal and tibial vessels appeared to be occluded. 3: Right lower extremity-there was a 95% eccentric calcified stenosis in the right common femoral artery. The right SFA was occluded. The profunda fill the popliteal artery by collaterals. I was unable to see the infrapopliteal vasculature  IMPRESSION: Mrs. Schlauch has critical limb ischemia on the left with rest pain and a nonhealing ulcer. She has very poor inflow and outflow. We will proceed with diamondback orbital rotational left colectomy for debulking of a highly calcified distal left common iliac artery stenosis with a 70-80 mm gradient followed by balloon angioplasty and stenting.  Final Impression: This Pottle has critical limb ischemia with resting pain. I successfully open the left common iliac artery for inflow. Her profunda appears patent and she has high-grade distal left common femoral and proximal profunda disease. I reviewed this with Dr. Cari Caraway , vascular surgeon, who is agreed to consider her for common femoral and profunda endarterectomy with patch  angioplasty to improve collateral flow to her infrapopliteal vessels. She left the lab in stable condition. She will be discharged home in the morning. The sheath will be removed once the eighth because below 170 and pressure held. She'll be hydrated overnight.   ASSESSMENT AND PLAN  1. PAD with claudication  - lower extremity Dopplers performed 10/18/14 revealed a right ABI 0.49 and a left 0.25  - resting pain on the left side with nonhealing ulcers.   - repeat LE doppler 08/25/2015 showed bilateral ABI critical range, R ABI 0.28, L ABI 0.08. Occluded great toe-brachial indices bilaterally. 50-74% R common femorral artery stenosis, 30-49%  L common femoral artery stenosis, known occluded bilateral SFA, trickle flow in bilateral popliteal arteries  - Proceeded with angiography to define anatomy in anticipation of potential femopop bypass  - LE angiography 08/28/2015 95% calcified stenosis in L common femoral artery with faint filling of profunda below, occluded L SFA, popliteal and tibial vessels. 95% eccentric R common femoral artery stenosis with occluded R SFA, R profunda fill the popliteal artery by collaterals, unable to see infrapopliteal vascular.  - she underwent successful L common iliac artery diamondback orbital rotational athectomy  - patient has been seen by Dr. Edilia Bo who recommended femoral endarterectomy with profundoplasty and intraoperative arteriography. Surgery is scheduled for 09/05/2015.   - discharge today, however will need to check with vascular surgery if need to hold plavix.   2. CAD  - s/p DES to LCx in 10/13/2009, and PCTA of distal small vessels of distal LCx.   3. HTN: continue amlodipine, imdur, losartan.  4. HLD: on 80mg  lipitor  5. DM II: hold metformin for 48 hours before resuming.    Signed, Azalee Course PA-C Pager: [phone] Agree with note by Azalee Course PA-C  S/P Diamondback Orbital Rotational Atherectomy, cutting balloon PTA and stenting highly calcified distal  LCIA. Groin OK. Feels much better. OK to DC home. ROV with me 2-3 weeks. Pt was seen by Dr. Edilia Bo for LCFA/ profunda endarterectomy with patch angioplasty.  Runell Gess, M.D., FACP, [facility], [facility] [address] [place], [place]  [address]  [phone] 08/29/2015 8:51 AM

## 2015-08-29 NOTE — Care Management Note (Signed)
Case Management Note  Patient Details  Name: [name] [name] MRN: [id number] Date of Birth: 1950-04-11  Subjective/Objective:  Patient is from home, s/p angiography, already on plavix, patient states she would like to work with [facility] if she needs [facility] services.  Referral made to [facility] with [facility] for HHPT, and Jermaine for rolling walker, he will bring up to patient's room.  Patient is for discharge today.                    Action/Plan:   Expected Discharge Date:                  Expected Discharge Plan:  Home/Self Care  In-House Referral:     Discharge planning Services  CM Consult  Post Acute Care Choice:  Home Health Choice offered to:  Patient  DME Arranged:  Walker rolling DME Agency:  [facility]  HH Arranged:  PT [facility] Agency:     Status of Service:  Completed, signed off  If discussed at Long Length of Stay Meetings, dates discussed:    Additional Comments:  Leone Haven, RN 08/29/2015, 3:37 PM

## 2015-08-29 NOTE — Progress Notes (Signed)
Addendum to discharge:  Discussed with Dr. Allyson Sabal, given pain from ischemic limb, will prescribe patient with limited 20 tablets of percocet. This is a one time Rx prior to vascular repair next week, if she needs more later, she will need to discuss with surgeon or her PCP.   We also got her rolling walker and home PT as per recommended by PT.  Ramond Dial PA Pager: [phone]

## 2015-08-29 NOTE — Discharge Instructions (Signed)
No driving for 48 hours. No lifting over 5 lbs for 1 week. No sexual activity for 1 week. Keep procedure site clean & dry. If you notice increased pain, swelling, bleeding or pus, call/return!  You may shower, but no soaking baths/hot tubs/pools for 1 week.   Seek medical attention if has sudden worsening of leg pain, discoloration or loss of sensation.

## 2015-08-29 NOTE — Evaluation (Signed)
Physical Therapy Evaluation Patient Details Name: [name] [name] MRN: [id number] DOB: 1951-01-22 Today's Date: 08/29/2015   History of Present Illness  LLE angiography with stent due to occlusive disease completed 08/28/15  Clinical Impression  Pt admitted with above diagnosis. Pt currently with functional limitations due to the deficits listed below (see PT Problem List). Pt will benefit from HHPT prior to upcoming surgery to increase their independence and safety with mobility (with new 2 wheel RW). HHPT can also address LLE contractures prior to surgery.    Follow Up Recommendations Home health PT    Equipment Recommendations  Rolling walker with 5" wheels    Recommendations for Other Services       Precautions / Restrictions Precautions Precautions: Fall Restrictions LLE Weight Bearing: Weight bearing as tolerated      Mobility  Bed Mobility Overal bed mobility: Modified Independent                Transfers Overall transfer level: Needs assistance Equipment used: Rolling walker (2 wheeled) Transfers: Sit to/from Stand Sit to Stand: Min assist         General transfer comment: to anchor RW as she pulls to stand (accustomed to rollator with brakes)  Ambulation/Gait Ambulation/Gait assistance: Min assist Ambulation Distance (Feet): 5 Feet Assistive device: Rolling walker (2 wheeled) Gait Pattern/deviations: Step-to pattern;Decreased stride length;Trunk flexed   Gait velocity interpretation: Below normal speed for age/gender General Gait Details: LLE held in flexion with wt-bearing through toes; UEs tend to "give way" due to weakness; educated to stand upright and attempt to lock her elbows for support  Stairs            Wheelchair Mobility    Modified Rankin (Stroke Patients Only)       Balance Overall balance assessment: Needs assistance Sitting-balance support: No upper extremity supported;Feet unsupported Sitting balance-Leahy Scale: Normal      Standing balance support: Bilateral upper extremity supported Standing balance-Leahy Scale: Poor Standing balance comment: due to wt-bearing only on RLE                             Pertinent Vitals/Pain Pain Assessment: Faces Faces Pain Scale: Hurts whole lot Pain Location: posterior Lt knee Pain Descriptors / Indicators: Cramping;Grimacing;Guarding Pain Intervention(s): Limited activity within patient's tolerance;Monitored during session;Repositioned;Relaxation    Home Living Family/patient expects to be discharged to:: Private residence Living Arrangements: Children Available Help at Discharge: Family;Available PRN/intermittently (sister and ?cousin also available) Type of Home: House Home Access: Stairs to enter Entrance Stairs-Rails: Left Entrance Stairs-Number of Steps: 3 Home Layout: One level Home Equipment: Walker - 4 wheels;Cane - quad;Cane - single point;Bedside commode Additional Comments: states 4 wheel walker has a seat; was her mother's    Prior Function Level of Independence: Independent with assistive device(s)         Comments: prior to onset of severe pain, walking short distances (limited by claudication pain)     Hand Dominance        Extremity/Trunk Assessment   Upper Extremity Assessment: Generalized weakness           Lower Extremity Assessment: Generalized weakness;LLE deficits/detail   LLE Deficits / Details: tends to hold her leg in abduction, flexion, external rotation; able to achieve neutral rotation, hip extension -30, knee extension -30, DF to neutral  Cervical / Trunk Assessment: Other exceptions  Communication   Communication: No difficulties  Cognition Arousal/Alertness: Awake/alert Behavior During Therapy: [facility] for  tasks assessed/performed Overall Cognitive Status: Within Functional Limits for tasks assessed                      General Comments General comments (skin integrity, edema, etc.):  Educated patient on differences between 4 wheel walker and 2 wheel walker (improved upright posture and ability to offload sore leg). she agreed she would like RW to use (especially in anticipation of 2 more surgeries)    Exercises Other Exercises Other Exercises: Educated pt in passive stretching of LLE (in supine with towel roll to lateral thigh, rolled towel under knee--small enough to allow slight stretch via gravity). Educated to attempt to stretch up to 30 minutes at a time, gradually making roll underknee smaller to incr stretch. Discussed use of ice (with appropriate barrier between ice pack and her skin) and benefits to pain and spasticity.      Assessment/Plan    PT Assessment All further PT needs can be met in the next venue of care  PT Diagnosis Difficulty walking;Acute pain   PT Problem List Decreased strength;Decreased range of motion;Decreased activity tolerance;Decreased balance;Decreased mobility;Decreased knowledge of use of DME;Impaired sensation;Pain  PT Treatment Interventions     PT Goals (Current goals can be found in the Care Plan section) Acute Rehab PT Goals Patient Stated Goal: be able to tolerate walking PT Goal Formulation: All assessment and education complete, DC therapy    Frequency     Barriers to discharge        Co-evaluation               End of Session Equipment Utilized During Treatment: Gait belt Activity Tolerance: Patient limited by pain Patient left: in chair;with call bell/phone within reach Nurse Communication: Mobility status;Other (comment) (HHPT, RW)    Functional Assessment Tool Used: clinical judgemetn Functional Limitation: Mobility: Walking and moving around Mobility: Walking and Moving Around Current Status [phone]): At least 1 percent but less than 20 percent impaired, limited or restricted Mobility: Walking and Moving Around Goal Status [phone]): At least 1 percent but less than 20 percent impaired, limited or  restricted Mobility: Walking and Moving Around Discharge Status [phone]): At least 1 percent but less than 20 percent impaired, limited or restricted    Time: 8315-1761 PT Time Calculation (min) (ACUTE ONLY): 38 min   Charges:   PT Evaluation $PT Eval Low Complexity: 1 Procedure PT Treatments $Therapeutic Exercise: 23-37 mins   PT G Codes:   PT G-Codes **NOT FOR INPATIENT CLASS** Functional Assessment Tool Used: clinical judgemetn Functional Limitation: Mobility: Walking and moving around Mobility: Walking and Moving Around Current Status ([id number]): At least 1 percent but less than 20 percent impaired, limited or restricted Mobility: Walking and Moving Around Goal Status [phone]): At least 1 percent but less than 20 percent impaired, limited or restricted Mobility: Walking and Moving Around Discharge Status [phone]): At least 1 percent but less than 20 percent impaired, limited or restricted    SASSER,LYNN 08/29/2015, 3:39 PM Pager [phone]

## 2015-08-29 NOTE — Discharge Summary (Signed)
Discharge Summary    Patient ID: [name] [name],  MRN: [id number], DOB/AGE: 06-02-1950 65 y.o.  Admit date: 08/28/2015 Discharge date: 08/29/2015  Primary Care Provider: WUJWJX,BJYNWG Primary Cardiologist: Dr. Tresa Endo PV specialist: Dr. Allyson Sabal  Discharge Diagnoses    Principal Problem:   Critical lower limb ischemia Active Problems:   PAD (peripheral artery disease) (HCC)   CAD (coronary artery disease): DES to dominant Circ 10/13/09   Diabetes mellitus (HCC)   Hyperlipidemia with target LDL less than 70   Allergies Allergies  Allergen Reactions  . Hydrochlorothiazide   . Latex Rash  . Penicillins Swelling and Rash    Has patient had a PCN reaction causing immediate rash, facial/tongue/throat swelling, SOB or lightheadedness with hypotension: Yes Has patient had a PCN reaction causing severe rash involving mucus membranes or skin necrosis: No Has patient had a PCN reaction that required hospitalization No Has patient had a PCN reaction occurring within the last 10 years: No If all of the above answers are "NO", then may proceed with Cephalosporin use.     Diagnostic Studies/Procedures    Lower extremity angiography Procedures Performed: 1. Abdominal aortogram/bilateral iliac angiogram/bifemoral runoff 2. Diamondback orbital rotational arthrectomy left common iliac artery 3. Cutting balloon atherectomy left common iliac artery 4. Stenting left common iliac artery Angiographic Data:   1: Abdominal aortogram-the distal abdominal aorta was fluoroscopically calcified and diffusely atherosclerotic 2: Left lower extremity-there was a 95% calcified distal left common iliac artery stenosis which was occlusive with passing an 0. 35 wire. Below that there was faint filling of the profunda. The SFA popliteal and tibial vessels appeared to be occluded. 3: Right lower extremity-there was a 95%  eccentric calcified stenosis in the right common femoral artery. The right SFA was occluded. The profunda fill the popliteal artery by collaterals. I was unable to see the infrapopliteal vasculature  IMPRESSION:[name] [name] has critical limb ischemia on the left with rest pain and a nonhealing ulcer. She has very poor inflow and outflow. We will proceed with diamondback orbital rotational left colectomy for debulking of a highly calcified distal left common iliac artery stenosis with a 70-80 mm gradient followed by balloon angioplasty and stenting.  Final Impression:This [name] [name] has critical limb ischemia with resting pain. I successfully open the left common iliac artery for inflow. Her profunda appears patent and she has high-grade distal left common femoral and proximal profunda disease. I reviewed this with Dr. Cari Caraway , vascular surgeon, who is agreed to consider her for common femoral and profunda endarterectomy with patch angioplasty to improve collateral flow to her infrapopliteal vessels. She left the lab in stable condition. She will be discharged home in the morning. The sheath will be removed once the eighth because below 170 and pressure held. She'll be hydrated overnight. _____________   History of Present Illness     [name] [name] is a 65 year old Caucasian female patient of Dr. Landry Dyke with a history of CAD and PAD. I last saw her at the time of her previous angiogram 01/03/15. She has had right common and external iliac artery stenting as well as diamondback atherectomy of her right SFA, PTA and stenting of that vessel as well with known occluded left SFA. She's had redilatation of her right SFA for "in-stent restenosis. I re-angiogramed 06/04/11 Demonstrating a patent right common iliac artery stent, an occluded right SFA stent, 60% proximal left external iliac artery stenosis with a 23 mm gradient, 80% left common femoral artery stenosis. She has severe  lifestyle including claudication as  well as rest pain on the left with recent Dopplers performed 10/26/14 revealing a right ABI 0.49 and a left upper and 25. We decide to proceed with angiography and potential intervention of her left external iliac artery. She may ultimately require left common femoral endarterectomy and patch angioplasty plus or minus femoropopliteal bypass grafting.her other problems include history of CAD status post circumflex stenting and at the time of a myocardial infarction. She has a history of hypertension, diabetes and hyperlipidemia as well. She denies chest pain or shortness of breath. She has severe lifestyle limiting claudication, and now has rest pain with a nonhealing ulcer on her left foot. Her left lower extremity arterial Doppler studies performed 10/26/14 showed a right ABI 0.49 and a left of 0.25. Her SFAs were occluded and she had high-grade common femoral disease bilaterally  Hospital Course     Her symptom was consistent with critical limb ischemia. Recent lower extremity Doppler obtained on 08/17/2015 showed bilateral ABI critical range, R ABI 0.28, L ABI 0.08. Occluded great toe-brachial indices bilaterally. 50-74% R common femorral artery stenosis, 30-49% L common femoral artery stenosis, known occluded bilateral SFA, trickle flow in bilateral popliteal arteries. She underwent lower extremity angiography on 08/28/2015 which showed 95% calcified stenosis in L common femoral artery with faint filling of profunda below, occluded L SFA, popliteal and tibial vessels. 95% eccentric R common femoral artery stenosis with occluded R SFA, R profunda fill the popliteal artery by collaterals, unable to see infrapopliteal vascular. She underwent left common iliac artery diamondback orbital rotational atherectomy followed by balloon angioplasty and stenting. Given the severe lower extremity stenosis, she was evaluated by Dr. Edilia Bo who agree patient will need femoral endarterectomy breath profundoplasty and  intraoperative arteriography in order to save the limb. This has been scheduled for 09/05/2015. I have discussed with Dr. Edilia Bo, who preferred to proceed with surgery if patient can come off Plavix, I have also discussed with Dr. Allyson Sabal who is okay with the patient to temporarily come off Plavix and restart after surgery.  Patient was seen in the morning of 08/29/2015 at which time she denies any significant discomfort. She is deemed stable for discharge from cardiology perspective. Her left femoral cath site appears to be stable without bleeding or significant hematoma.  _____________  Discharge Vitals Blood pressure (!) 97/52, pulse 85, temperature 97 F (36.1 C), temperature source Oral, resp. rate (!) 21, height  (1.626 m), weight 166 lb 3.6 oz (75.4 kg), SpO2 96 %.  Filed Weights   08/28/15 0620 08/29/15 0259  Weight: 165 lb (74.8 kg) 166 lb 3.6 oz (75.4 kg)    Labs & Radiologic Studies     CBC  Recent Labs  08/29/15 0321  WBC 8.7  HGB 10.8*  HCT 31.9*  MCV 87.6  PLT 233   Basic Metabolic Panel  Recent Labs  08/29/15 0321  NA 133*  K 4.4  CL 103  CO2 24  GLUCOSE 236*  BUN 20  CREATININE 0.78  CALCIUM 8.9    Dg Chest 2 View  Result Date: 08/18/2015 CLINICAL DATA:  65 year old with bilateral lower extremity swelling and shortness of breath. Preoperative evaluation prior to arteriography. EXAM: CHEST  2 VIEW COMPARISON:  03/02/2013, 05/29/2011 and earlier. FINDINGS: AP sitting erect and lateral images were obtained. Cardiac silhouette normal in size for AP technique, unchanged. Thoracic aorta atherosclerotic, unchanged. Hilar and mediastinal contours otherwise unremarkable. Lungs clear. Bronchovascular markings normal. Pulmonary vascularity normal. No visible pleural  effusions. No pneumothorax. Mild degenerative changes involving the thoracic spine. IMPRESSION: 1.  No acute cardiopulmonary disease. 2. Thoracic aortic atherosclerosis. Electronically Signed   By: Hulan Saas M.D.   On: 08/18/2015 15:40    Disposition   Pt is being discharged home today in good condition.  Follow-up Plans & Appointments    Follow-up Information    Nanetta Batty, MD Follow up on 09/19/2015.   Specialties:  Cardiology, Radiology Why:  4:00PM. Cardiology followup. Contact information: [address] [place] [address] [phone]        Waverly Ferrari, MD .   Specialties:  Vascular Surgery, Cardiology Why:  Dr. Adele Dan office will contact you for preoperative instruction.  Contact information: [address] [place] [address] [phone]          Discharge Instructions    Diet - low sodium heart healthy    Complete by:  As directed   Increase activity slowly    Complete by:  As directed      Discharge Medications   Current Discharge Medication List    CONTINUE these medications which have CHANGED   Details  clopidogrel (PLAVIX) 75 MG tablet Take 1 tablet (75 mg total) by mouth daily. Holding for vascular surgery, restart as soon as possible after leg surgery (after 8/8) Qty: 90 tablet, Refills: 3      CONTINUE these medications which have NOT CHANGED   Details  amLODipine (NORVASC) 5 MG tablet TAKE 1 TABLET(5 MG) BY MOUTH DAILY Qty: 30 tablet, Refills: 11    aspirin EC 81 MG tablet Take 81 mg by mouth daily.    atorvastatin (LIPITOR) 80 MG tablet Take 1 tablet (80 mg total) by mouth daily. Qty: 90 tablet, Refills: 1    gabapentin (NEURONTIN) 100 MG capsule Take 300 mg by mouth 2 (two) times daily.  Refills: 3    Insulin Glargine (LANTUS) 100 UNIT/ML Solostar Pen Inject 35 Units into the skin daily at 10 pm.     isosorbide mononitrate (IMDUR) 60 MG 24 hr tablet Take 1.5 tablets by mouth once daily. <please make appointment> Qty: 45 tablet, Refills: 6    losartan (COZAAR) 100 MG tablet TAKE 1 TABLET BY MOUTH EVERY DAY Qty: 30 tablet, Refills: 11    metFORMIN (GLUCOPHAGE) 1000 MG tablet Take 1,000 mg  by mouth 2 (two) times daily with a meal.    metoprolol succinate (TOPROL-XL) 50 MG 24 hr tablet TAKE 1 TABLET(50 MG) BY MOUTH DAILY Qty: 30 tablet, Refills: 0    pantoprazole (PROTONIX) 40 MG tablet Take 1 tablet (40 mg total) by mouth daily. Qty: 30 tablet, Refills: 10          Outstanding Labs/Studies   None  Duration of Discharge Encounter   Greater than 30 minutes including physician time.  Signed, Azalee Course PA-C 08/29/2015, 11:33 AM

## 2015-09-01 ENCOUNTER — Encounter (HOSPITAL_COMMUNITY): Payer: Self-pay

## 2015-09-01 ENCOUNTER — Telehealth: Payer: Self-pay | Admitting: Cardiovascular Disease

## 2015-09-01 ENCOUNTER — Other Ambulatory Visit: Payer: Self-pay | Admitting: *Deleted

## 2015-09-01 ENCOUNTER — Encounter (HOSPITAL_COMMUNITY)
Admission: RE | Admit: 2015-09-01 | Discharge: 2015-09-01 | Disposition: A | Discharge status: Home / Self Care | Payer: Medicare Other | Source: Ambulatory Visit | Attending: Vascular Surgery | Admitting: Vascular Surgery

## 2015-09-01 DIAGNOSIS — Z794 Long term (current) use of insulin: Secondary | ICD-10-CM | POA: Insufficient documentation

## 2015-09-01 DIAGNOSIS — Z0183 Encounter for blood typing: Secondary | ICD-10-CM | POA: Insufficient documentation

## 2015-09-01 DIAGNOSIS — Z880 Allergy status to penicillin: Secondary | ICD-10-CM | POA: Insufficient documentation

## 2015-09-01 DIAGNOSIS — I10 Essential (primary) hypertension: Secondary | ICD-10-CM | POA: Insufficient documentation

## 2015-09-01 DIAGNOSIS — Z888 Allergy status to other drugs, medicaments and biological substances status: Secondary | ICD-10-CM | POA: Diagnosis not present

## 2015-09-01 DIAGNOSIS — Z01812 Encounter for preprocedural laboratory examination: Secondary | ICD-10-CM | POA: Insufficient documentation

## 2015-09-01 DIAGNOSIS — Z79899 Other long term (current) drug therapy: Secondary | ICD-10-CM | POA: Insufficient documentation

## 2015-09-01 DIAGNOSIS — I70245 Atherosclerosis of native arteries of left leg with ulceration of other part of foot: Secondary | ICD-10-CM | POA: Insufficient documentation

## 2015-09-01 DIAGNOSIS — Z87891 Personal history of nicotine dependence: Secondary | ICD-10-CM | POA: Insufficient documentation

## 2015-09-01 DIAGNOSIS — Z91040 Latex allergy status: Secondary | ICD-10-CM | POA: Diagnosis not present

## 2015-09-01 DIAGNOSIS — E785 Hyperlipidemia, unspecified: Secondary | ICD-10-CM | POA: Insufficient documentation

## 2015-09-01 HISTORY — DX: Family history of other specified conditions: Z84.89

## 2015-09-01 HISTORY — DX: Gastro-esophageal reflux disease without esophagitis: K21.9

## 2015-09-01 LAB — SURGICAL PCR SCREEN
MRSA, PCR: NEGATIVE
STAPHYLOCOCCUS AUREUS: NEGATIVE

## 2015-09-01 LAB — ABO/RH: ABO/RH(D): A POS

## 2015-09-01 LAB — GLUCOSE, CAPILLARY: Glucose-Capillary: 155 mg/dL — ABNORMAL HIGH (ref 65–99)

## 2015-09-01 NOTE — Pre-Procedure Instructions (Signed)
[name] [name]  09/01/2015      [id number] - [place], [place] - [address] AT [facility] OF [address] [place] [address] Phone: [phone] Fax: [phone]  [facility] - [place], [place] - [address] AT [facility] [address] [place] [address] [place] [address] Phone: [phone] Fax: [phone]    Your procedure is scheduled on Tues, Aug 8 @ 7:30 AM  Report to [facility] Admitting at 5:30 AM  Call this number if you have problems the morning of surgery:  [phone]   Remember:  Do not eat food or drink liquids after midnight.  Take these medicines the morning of surgery with A SIP OF WATER Amlodipine(Norvasc),Aspirin,Gabapentin(Neurontin),Imdur(Isosorbide),Metoprolol(Toprol),Pantoprazole(Protonix),and Pain Pill(if needed)               No Goody's,BC's,Aleve,Advil,Motrin,Ibuprofen,Fish Oil,or any Herbal Medications.      How to Manage Your Diabetes Before and After Surgery  Why is it important to control my blood sugar before and after surgery? . Improving blood sugar levels before and after surgery helps healing and can limit problems. . A way of improving blood sugar control is eating a healthy diet by: o  Eating less sugar and carbohydrates o  Increasing activity/exercise o  Talking with your doctor about reaching your blood sugar goals . High blood sugars (greater than 180 mg/dL) can raise your risk of infections and slow your recovery, so you will need to focus on controlling your diabetes during the weeks before surgery. . Make sure that the doctor who takes care of your diabetes knows about your planned surgery including the date and location.  How do I manage my blood sugar before surgery? . Check your blood sugar at least 4 times a day, starting 2 days before surgery, to make sure that the level is not too high or low. o Check your blood sugar the morning of your  surgery when you wake up and every 2 hours until you get to the Short Stay unit. . If your blood sugar is less than 70 mg/dL, you will need to treat for low blood sugar: o Do not take insulin. o Treat a low blood sugar (less than 70 mg/dL) with  cup of clear juice (cranberry or apple), 4 glucose tablets, OR glucose gel. o Recheck blood sugar in 15 minutes after treatment (to make sure it is greater than 70 mg/dL). If your blood sugar is not greater than 70 mg/dL on recheck, call [phone] for further instructions. . Report your blood sugar to the short stay nurse when you get to Short Stay.  . If you are admitted to the hospital after surgery: o Your blood sugar will be checked by the staff and you will probably be given insulin after surgery (instead of oral diabetes medicines) to make sure you have good blood sugar levels. o The goal for blood sugar control after surgery is 80-180 mg/dL.              WHAT DO I DO ABOUT MY DIABETES MEDICATION?   Marland Kitchen Do not take oral diabetes medicines (pills) the morning of surgery.  . THE NIGHT BEFORE SURGERY, take _____17______ units of __Lantus_________insulin.       .   . The day of surgery, do not take other diabetes injectables, including Byetta (exenatide), Bydureon (exenatide ER), Victoza (liraglutide), or Trulicity (dulaglutide).  . If your CBG is  greater than 220 mg/dL, you may take  of your sliding scale (correction) dose of insulin.  Other Instructions:          Patient Signature:  Date:   Nurse Signature:  Date:   Reviewed and Endorsed by [facility] Patient Education Committee, August 2015    Do not wear jewelry, make-up or nail polish.  Do not wear lotions, powders, or perfumes.  You may wear deoderant.  Do not shave 48 hours prior to surgery.     Do not bring valuables to the hospital.  [facility] is not responsible for any belongings or valuables.  Contacts, dentures or bridgework may not be worn into  surgery.  Leave your suitcase in the car.  After surgery it may be brought to your room.  For patients admitted to the hospital, discharge time will be determined by your treatment team.  Patients discharged the day of surgery will not be allowed to drive home.    Special instructions:   [facility] - Preparing for Surgery  Before surgery, you can play an important role.  Because skin is not sterile, your skin needs to be as free of germs as possible.  You can reduce the number of germs on you skin by washing with CHG (chlorahexidine gluconate) soap before surgery.  CHG is an antiseptic cleaner which kills germs and bonds with the skin to continue killing germs even after washing.  Please DO NOT use if you have an allergy to CHG or antibacterial soaps.  If your skin becomes reddened/irritated stop using the CHG and inform your nurse when you arrive at Short Stay.  Do not shave (including legs and underarms) for at least 48 hours prior to the first CHG shower.  You may shave your face.  Please follow these instructions carefully:   1.  Shower with CHG Soap the night before surgery and the                                morning of Surgery.  2.  If you choose to wash your hair, wash your hair first as usual with your       normal shampoo.  3.  After you shampoo, rinse your hair and body thoroughly to remove the                      Shampoo.  4.  Use CHG as you would any other liquid soap.  You can apply chg directly       to the skin and wash gently with scrungie or a clean washcloth.  5.  Apply the CHG Soap to your body ONLY FROM THE NECK DOWN.        Do not use on open wounds or open sores.  Avoid contact with your eyes,       ears, mouth and genitals (private parts).  Wash genitals (private parts)       with your normal soap.  6.  Wash thoroughly, paying special attention to the area where your surgery        will be performed.  7.  Thoroughly rinse your body with warm water from the neck  down.  8.  DO NOT shower/wash with your normal soap after using and rinsing off       the CHG Soap.  9.  Pat yourself dry with a clean towel.  10.  Wear clean pajamas.            11.  Place clean sheets on your bed the night of your first shower and do not        sleep with pets.  Day of Surgery  Do not apply any lotions/deoderants the morning of surgery.  Please wear clean clothes to the hospital/surgery center.   Please read over the following fact sheets that you were given. Pain Booklet, MRSA Information and Surgical Site Infection Prevention

## 2015-09-01 NOTE — Telephone Encounter (Signed)
Threasa Alpha, PT called to inform us that the patient was not feeling well and cancelled appt for in-home PT initial visit today.  He will reattempt to set up w patient next week.  Called our office as FYI.  I called and spoke w/ patient. She states she is just not feeling up to PT today, just had the surgery on her foot on Tuesday, she is getting bloodwork done today, and felt like she didn't have the time or energy to commit to PT appt.  I did discuss benefits of PT w her and encouraged her to keep option open to have consult done next week. Pt voiced understanding and thanks for the call.

## 2015-09-01 NOTE — Progress Notes (Signed)
Left message for [name] [name] that orders are needed.

## 2015-09-01 NOTE — Telephone Encounter (Signed)
New Message  Referral received first visit planned for 8.4.17,   Pt canceled last min. due to not feeling well,   Pt declined visit to check on her status.   Pt stated she already alerted the Dr.   Please follow up with Dr. Threasa Alpha

## 2015-09-01 NOTE — Progress Notes (Signed)
Cardiologist is Dr.Jonathan Berry,last visit in epic from 08-18-15  Medical MD is Dr.Karrar Donette Larry  EKG in epic from 08-29-15  CXR in epic from 08-18-15  Echo reports in epic from 2011/2015/2016  Stress test report in epic from 2012  Heart cath

## 2015-09-02 LAB — HEMOGLOBIN A1C
HEMOGLOBIN A1C: 8.3 % — AB (ref 4.8–5.6)
MEAN PLASMA GLUCOSE: 192 mg/dL

## 2015-09-04 ENCOUNTER — Other Ambulatory Visit: Payer: Self-pay

## 2015-09-04 NOTE — Anesthesia Preprocedure Evaluation (Addendum)
Anesthesia Evaluation  Patient identified by MRN, date of birth, ID band Patient awake    Reviewed: Allergy & Precautions, NPO status , Patient's Chart, lab work & pertinent test results  Airway Mallampati: II  TM Distance: >3 FB Neck ROM: Full    Dental no notable dental hx. (+) Upper Dentures, Poor Dentition   Pulmonary neg pulmonary ROS, former smoker,    Pulmonary exam normal breath sounds clear to auscultation       Cardiovascular hypertension, Pt. on medications and Pt. on home beta blockers + CAD, + Past MI and + Peripheral Vascular Disease  Normal cardiovascular exam Rhythm:Regular Rate:Normal  Echo 2016 Left ventricle: Distal septal hypokinesis. The cavity size was mildly dilated. Wall thickness was normal. Systolic function was normal. The estimated ejection fraction was in the range of 50% to 55%. - Left atrium: The atrium was mildly dilated. - Atrial septum: No subcostal views.   Neuro/Psych  Headaches, negative psych ROS   GI/Hepatic Neg liver ROS, GERD  ,  Endo/Other  diabetes, Type 2  Renal/GU negative Renal ROS     Musculoskeletal  (+) Arthritis ,   Abdominal   Peds  Hematology negative hematology ROS (+)   Anesthesia Other Findings   Reproductive/Obstetrics negative OB ROS                           Anesthesia Physical Anesthesia Plan  ASA: IV  Anesthesia Plan: General   Post-op Pain Management:    Induction: Intravenous  Airway Management Planned: Oral ETT  Additional Equipment:   Intra-op Plan:   Post-operative Plan: Extubation in OR  Informed Consent: I have reviewed the patients History and Physical, chart, labs and discussed the procedure including the risks, benefits and alternatives for the proposed anesthesia with the patient or authorized representative who has indicated his/her understanding and acceptance.   Dental advisory given  Plan Discussed  with: CRNA  Anesthesia Plan Comments:        Anesthesia Quick Evaluation

## 2015-09-05 ENCOUNTER — Encounter (HOSPITAL_COMMUNITY): Payer: Self-pay | Admitting: Critical Care Medicine

## 2015-09-05 ENCOUNTER — Inpatient Hospital Stay (HOSPITAL_COMMUNITY)
Admission: RE | Admit: 2015-09-05 | Discharge: 2015-09-07 | DRG: 271 | Disposition: A | Discharge status: Home Health | Payer: Medicare Other | Source: Ambulatory Visit | Attending: Vascular Surgery | Admitting: Vascular Surgery

## 2015-09-05 ENCOUNTER — Inpatient Hospital Stay (HOSPITAL_COMMUNITY): Payer: Medicare Other | Admitting: Emergency Medicine

## 2015-09-05 ENCOUNTER — Encounter (HOSPITAL_COMMUNITY)
Admission: RE | Disposition: A | Discharge status: Home Health | Payer: Self-pay | Source: Ambulatory Visit | Attending: Vascular Surgery

## 2015-09-05 ENCOUNTER — Inpatient Hospital Stay (HOSPITAL_COMMUNITY): Payer: Medicare Other

## 2015-09-05 ENCOUNTER — Telehealth: Payer: Self-pay | Admitting: *Deleted

## 2015-09-05 DIAGNOSIS — Z833 Family history of diabetes mellitus: Secondary | ICD-10-CM | POA: Diagnosis not present

## 2015-09-05 DIAGNOSIS — Z91040 Latex allergy status: Secondary | ICD-10-CM

## 2015-09-05 DIAGNOSIS — I252 Old myocardial infarction: Secondary | ICD-10-CM | POA: Diagnosis not present

## 2015-09-05 DIAGNOSIS — Z8249 Family history of ischemic heart disease and other diseases of the circulatory system: Secondary | ICD-10-CM

## 2015-09-05 DIAGNOSIS — Z87891 Personal history of nicotine dependence: Secondary | ICD-10-CM

## 2015-09-05 DIAGNOSIS — I10 Essential (primary) hypertension: Secondary | ICD-10-CM | POA: Diagnosis not present

## 2015-09-05 DIAGNOSIS — E1151 Type 2 diabetes mellitus with diabetic peripheral angiopathy without gangrene: Principal | ICD-10-CM | POA: Diagnosis present

## 2015-09-05 DIAGNOSIS — E1140 Type 2 diabetes mellitus with diabetic neuropathy, unspecified: Secondary | ICD-10-CM | POA: Diagnosis present

## 2015-09-05 DIAGNOSIS — E785 Hyperlipidemia, unspecified: Secondary | ICD-10-CM | POA: Diagnosis present

## 2015-09-05 DIAGNOSIS — D62 Acute posthemorrhagic anemia: Secondary | ICD-10-CM | POA: Diagnosis not present

## 2015-09-05 DIAGNOSIS — I998 Other disorder of circulatory system: Secondary | ICD-10-CM

## 2015-09-05 DIAGNOSIS — I743 Embolism and thrombosis of arteries of the lower extremities: Secondary | ICD-10-CM | POA: Diagnosis not present

## 2015-09-05 DIAGNOSIS — K219 Gastro-esophageal reflux disease without esophagitis: Secondary | ICD-10-CM | POA: Diagnosis not present

## 2015-09-05 DIAGNOSIS — I2510 Atherosclerotic heart disease of native coronary artery without angina pectoris: Secondary | ICD-10-CM | POA: Diagnosis not present

## 2015-09-05 DIAGNOSIS — I739 Peripheral vascular disease, unspecified: Secondary | ICD-10-CM | POA: Diagnosis not present

## 2015-09-05 DIAGNOSIS — Z880 Allergy status to penicillin: Secondary | ICD-10-CM | POA: Diagnosis not present

## 2015-09-05 DIAGNOSIS — E11621 Type 2 diabetes mellitus with foot ulcer: Secondary | ICD-10-CM | POA: Diagnosis present

## 2015-09-05 DIAGNOSIS — L97529 Non-pressure chronic ulcer of other part of left foot with unspecified severity: Secondary | ICD-10-CM | POA: Diagnosis not present

## 2015-09-05 DIAGNOSIS — I70229 Atherosclerosis of native arteries of extremities with rest pain, unspecified extremity: Secondary | ICD-10-CM

## 2015-09-05 DIAGNOSIS — IMO0001 Reserved for inherently not codable concepts without codable children: Secondary | ICD-10-CM

## 2015-09-05 HISTORY — PX: ENDARTERECTOMY FEMORAL: SHX5804

## 2015-09-05 HISTORY — PX: INTRAOPERATIVE ARTERIOGRAM: SHX5157

## 2015-09-05 LAB — URINALYSIS, ROUTINE W REFLEX MICROSCOPIC
BILIRUBIN URINE: NEGATIVE
Glucose, UA: 100 mg/dL — AB
KETONES UR: NEGATIVE mg/dL
Leukocytes, UA: NEGATIVE
NITRITE: NEGATIVE
PH: 6 (ref 5.0–8.0)
Protein, ur: 100 mg/dL — AB
Specific Gravity, Urine: 1.013 (ref 1.005–1.030)

## 2015-09-05 LAB — CREATININE, SERUM
Creatinine, Ser: 0.47 mg/dL (ref 0.44–1.00)
GFR calc non Af Amer: >60 mL/min (ref >60)

## 2015-09-05 LAB — PROTIME-INR
INR: 1.13
PROTHROMBIN TIME: 14.6 s (ref 11.4–15.2)

## 2015-09-05 LAB — URINE MICROSCOPIC-ADD ON

## 2015-09-05 LAB — COMPREHENSIVE METABOLIC PANEL
ALK PHOS: 68 U/L (ref 38–126)
ALT: 12 U/L — ABNORMAL LOW (ref 14–54)
ANION GAP: 9 (ref 5–15)
AST: 13 U/L — AB (ref 15–41)
Albumin: 2.8 g/dL — ABNORMAL LOW (ref 3.5–5.0)
BILIRUBIN TOTAL: 0.3 mg/dL (ref 0.3–1.2)
BUN: 14 mg/dL (ref 6–20)
CALCIUM: 9.1 mg/dL (ref 8.9–10.3)
CO2: 24 mmol/L (ref 22–32)
Chloride: 105 mmol/L (ref 101–111)
Creatinine, Ser: 0.59 mg/dL (ref 0.44–1.00)
GFR calc Af Amer: >60 mL/min (ref >60)
Glucose, Bld: 155 mg/dL — ABNORMAL HIGH (ref 65–99)
POTASSIUM: 4.6 mmol/L (ref 3.5–5.1)
Sodium: 138 mmol/L (ref 135–145)
TOTAL PROTEIN: 6.2 g/dL — AB (ref 6.5–8.1)

## 2015-09-05 LAB — GLUCOSE, CAPILLARY
GLUCOSE-CAPILLARY: 102 mg/dL — AB (ref 65–99)
GLUCOSE-CAPILLARY: 207 mg/dL — AB (ref 65–99)
Glucose-Capillary: 160 mg/dL — ABNORMAL HIGH (ref 65–99)
Glucose-Capillary: 162 mg/dL — ABNORMAL HIGH (ref 65–99)

## 2015-09-05 LAB — CBC
HEMATOCRIT: 31.3 % — AB (ref 36.0–46.0)
Hemoglobin: 10.6 g/dL — ABNORMAL LOW (ref 12.0–15.0)
MCH: 29.6 pg (ref 26.0–34.0)
MCHC: 33.9 g/dL (ref 30.0–36.0)
MCV: 87.4 fL (ref 78.0–100.0)
Platelets: 221 10*3/uL (ref 150–400)
RBC: 3.58 MIL/uL — ABNORMAL LOW (ref 3.87–5.11)
RDW: 13.1 % (ref 11.5–15.5)
WBC: 11.4 10*3/uL — AB (ref 4.0–10.5)

## 2015-09-05 LAB — APTT: aPTT: 31 seconds (ref 24–36)

## 2015-09-05 LAB — PREPARE RBC (CROSSMATCH)

## 2015-09-05 SURGERY — ENDARTERECTOMY, FEMORAL
Anesthesia: General | Laterality: Left

## 2015-09-05 MED ORDER — GLYCOPYRROLATE 0.2 MG/ML IV SOSY
PREFILLED_SYRINGE | INTRAVENOUS | Status: AC
  Filled 2015-09-05: qty 3

## 2015-09-05 MED ORDER — ONDANSETRON HCL 4 MG/2ML IJ SOLN
INTRAMUSCULAR | Status: AC
  Filled 2015-09-05: qty 2

## 2015-09-05 MED ORDER — SODIUM CHLORIDE 0.9 % IV SOLN
INTRAVENOUS | Status: DC
Start: 2015-09-05 — End: 2015-09-07
  Administered 2015-09-05: 17:00:00 via INTRAVENOUS

## 2015-09-05 MED ORDER — PAPAVERINE HCL 30 MG/ML IJ SOLN
INTRAMUSCULAR | Status: AC
  Filled 2015-09-05: qty 2

## 2015-09-05 MED ORDER — ENOXAPARIN SODIUM 40 MG/0.4ML ~~LOC~~ SOLN
40.0000 mg | SUBCUTANEOUS | Status: DC
  Administered 2015-09-06: 40 mg via SUBCUTANEOUS
  Filled 2015-09-05: qty 0.4

## 2015-09-05 MED ORDER — GUAIFENESIN-DM 100-10 MG/5ML PO SYRP: 15.0000 mL | ORAL_SOLUTION | ORAL | Status: DC | PRN

## 2015-09-05 MED ORDER — ARTIFICIAL TEARS OP OINT
TOPICAL_OINTMENT | OPHTHALMIC | Status: DC | PRN
  Administered 2015-09-05: 1 via OPHTHALMIC

## 2015-09-05 MED ORDER — ARTIFICIAL TEARS OP OINT
TOPICAL_OINTMENT | OPHTHALMIC | Status: AC
  Filled 2015-09-05: qty 3.5

## 2015-09-05 MED ORDER — PANTOPRAZOLE SODIUM 40 MG PO TBEC
40.0000 mg | DELAYED_RELEASE_TABLET | Freq: Every day | ORAL | Status: DC
  Administered 2015-09-06 – 2015-09-07 (×2): 40 mg via ORAL
  Filled 2015-09-05 (×2): qty 1

## 2015-09-05 MED ORDER — FENTANYL CITRATE (PF) 100 MCG/2ML IJ SOLN
INTRAMUSCULAR | Status: DC | PRN
Start: 2015-09-05 — End: 2015-09-05
  Administered 2015-09-05: 50 ug via INTRAVENOUS
  Administered 2015-09-05: 100 ug via INTRAVENOUS
  Administered 2015-09-05: 50 ug via INTRAVENOUS

## 2015-09-05 MED ORDER — PHENYLEPHRINE HCL 10 MG/ML IJ SOLN
INTRAMUSCULAR | Status: DC | PRN
  Administered 2015-09-05: 80 ug via INTRAVENOUS

## 2015-09-05 MED ORDER — SODIUM CHLORIDE 0.9 % IV SOLN: 500.0000 mL | Freq: Once | INTRAVENOUS | Status: DC | PRN

## 2015-09-05 MED ORDER — POTASSIUM CHLORIDE CRYS ER 20 MEQ PO TBCR
20.0000 meq | EXTENDED_RELEASE_TABLET | Freq: Every day | ORAL | Status: DC | PRN
Start: 2015-09-05 — End: 2015-09-07

## 2015-09-05 MED ORDER — METOPROLOL TARTRATE 5 MG/5ML IV SOLN: 2.0000 mg | INTRAVENOUS | Status: DC | PRN

## 2015-09-05 MED ORDER — LABETALOL HCL 5 MG/ML IV SOLN: 10.0000 mg | INTRAVENOUS | Status: DC | PRN

## 2015-09-05 MED ORDER — NEOSTIGMINE METHYLSULFATE 5 MG/5ML IV SOSY
PREFILLED_SYRINGE | INTRAVENOUS | Status: AC
  Filled 2015-09-05: qty 5

## 2015-09-05 MED ORDER — MIDAZOLAM HCL 5 MG/5ML IJ SOLN
INTRAMUSCULAR | Status: DC | PRN
  Administered 2015-09-05: 2 mg via INTRAVENOUS

## 2015-09-05 MED ORDER — ATROPINE SULFATE 0.4 MG/ML IV SOSY
PREFILLED_SYRINGE | INTRAVENOUS | Status: AC
  Filled 2015-09-05: qty 2.5

## 2015-09-05 MED ORDER — ETOMIDATE 2 MG/ML IV SOLN
INTRAVENOUS | Status: AC
  Filled 2015-09-05: qty 10

## 2015-09-05 MED ORDER — CHLORHEXIDINE GLUCONATE CLOTH 2 % EX PADS: 6.0000 | MEDICATED_PAD | Freq: Once | CUTANEOUS | Status: DC

## 2015-09-05 MED ORDER — METOPROLOL SUCCINATE ER 50 MG PO TB24
50.0000 mg | ORAL_TABLET | Freq: Every day | ORAL | Status: DC
  Administered 2015-09-06 – 2015-09-07 (×2): 50 mg via ORAL
  Filled 2015-09-05 (×2): qty 1

## 2015-09-05 MED ORDER — THROMBIN 20000 UNITS EX SOLR
CUTANEOUS | Status: AC
  Filled 2015-09-05: qty 20000

## 2015-09-05 MED ORDER — SODIUM CHLORIDE 0.9 % IV SOLN
INTRAVENOUS | Status: DC | PRN
  Administered 2015-09-05: 500 mL

## 2015-09-05 MED ORDER — GLYCOPYRROLATE 0.2 MG/ML IJ SOLN
INTRAMUSCULAR | Status: DC | PRN
  Administered 2015-09-05: 0.3 mg via INTRAVENOUS

## 2015-09-05 MED ORDER — ALUM & MAG HYDROXIDE-SIMETH 200-200-20 MG/5ML PO SUSP
15.0000 mL | ORAL | Status: DC | PRN
  Administered 2015-09-06: 30 mL via ORAL
  Filled 2015-09-05: qty 30

## 2015-09-05 MED ORDER — SODIUM CHLORIDE 0.9 % IJ SOLN
INTRAMUSCULAR | Status: AC
  Filled 2015-09-05: qty 10

## 2015-09-05 MED ORDER — HEPARIN SODIUM (PORCINE) 1000 UNIT/ML IJ SOLN
INTRAMUSCULAR | Status: AC
  Filled 2015-09-05: qty 1

## 2015-09-05 MED ORDER — HYDRALAZINE HCL 20 MG/ML IJ SOLN: 5.0000 mg | INTRAMUSCULAR | Status: DC | PRN

## 2015-09-05 MED ORDER — OXYCODONE-ACETAMINOPHEN 5-325 MG PO TABS
1.0000 | ORAL_TABLET | ORAL | Status: DC | PRN
  Administered 2015-09-05: 1 via ORAL
  Administered 2015-09-06 – 2015-09-07 (×5): 2 via ORAL
  Filled 2015-09-05: qty 2
  Filled 2015-09-05: qty 1
  Filled 2015-09-05 (×4): qty 2

## 2015-09-05 MED ORDER — PROTAMINE SULFATE 10 MG/ML IV SOLN
INTRAVENOUS | Status: AC
  Filled 2015-09-05: qty 5

## 2015-09-05 MED ORDER — ROCURONIUM BROMIDE 10 MG/ML (PF) SYRINGE
PREFILLED_SYRINGE | INTRAVENOUS | Status: AC
  Filled 2015-09-05: qty 10

## 2015-09-05 MED ORDER — PAPAVERINE HCL 30 MG/ML IJ SOLN
INTRAMUSCULAR | Status: DC | PRN
  Administered 2015-09-05: 60 mg via INTRAVENOUS

## 2015-09-05 MED ORDER — MAGNESIUM SULFATE 2 GM/50ML IV SOLN
2.0000 g | Freq: Every day | INTRAVENOUS | Status: DC | PRN
  Filled 2015-09-05: qty 50

## 2015-09-05 MED ORDER — ATORVASTATIN CALCIUM 80 MG PO TABS
80.0000 mg | ORAL_TABLET | Freq: Every day | ORAL | Status: DC
  Administered 2015-09-05 – 2015-09-06 (×2): 80 mg via ORAL
  Filled 2015-09-05 (×2): qty 1

## 2015-09-05 MED ORDER — VANCOMYCIN HCL IN DEXTROSE 1-5 GM/200ML-% IV SOLN
1000.0000 mg | Freq: Two times a day (BID) | INTRAVENOUS | Status: AC
  Administered 2015-09-05 – 2015-09-06 (×2): 1000 mg via INTRAVENOUS
  Filled 2015-09-05 (×2): qty 200

## 2015-09-05 MED ORDER — PHENYLEPHRINE 40 MCG/ML (10ML) SYRINGE FOR IV PUSH (FOR BLOOD PRESSURE SUPPORT)
PREFILLED_SYRINGE | INTRAVENOUS | Status: AC
  Filled 2015-09-05: qty 10

## 2015-09-05 MED ORDER — DOCUSATE SODIUM 100 MG PO CAPS
100.0000 mg | ORAL_CAPSULE | Freq: Every day | ORAL | Status: DC
  Administered 2015-09-06 – 2015-09-07 (×2): 100 mg via ORAL
  Filled 2015-09-05 (×2): qty 1

## 2015-09-05 MED ORDER — ACETAMINOPHEN 325 MG PO TABS: 325.0000 mg | ORAL_TABLET | ORAL | Status: DC | PRN

## 2015-09-05 MED ORDER — SODIUM CHLORIDE 0.9 % IV SOLN
INTRAVENOUS | Status: DC
Start: 2015-09-05 — End: 2015-09-05

## 2015-09-05 MED ORDER — IOPAMIDOL (ISOVUE-300) INJECTION 61%
INTRAVENOUS | Status: AC
  Filled 2015-09-05: qty 50

## 2015-09-05 MED ORDER — OXYCODONE HCL 5 MG PO TABS
ORAL_TABLET | ORAL | Status: AC
  Filled 2015-09-05: qty 1

## 2015-09-05 MED ORDER — LACTATED RINGERS IV SOLN
INTRAVENOUS | Status: DC | PRN
  Administered 2015-09-05 (×2): via INTRAVENOUS

## 2015-09-05 MED ORDER — FENTANYL CITRATE (PF) 250 MCG/5ML IJ SOLN
INTRAMUSCULAR | Status: AC
  Filled 2015-09-05: qty 5

## 2015-09-05 MED ORDER — MIDAZOLAM HCL 2 MG/2ML IJ SOLN
INTRAMUSCULAR | Status: AC
  Filled 2015-09-05: qty 2

## 2015-09-05 MED ORDER — MORPHINE SULFATE (PF) 2 MG/ML IV SOLN
2.0000 mg | INTRAVENOUS | Status: DC | PRN
  Administered 2015-09-05 – 2015-09-06 (×3): 4 mg via INTRAVENOUS
  Filled 2015-09-05 (×2): qty 1
  Filled 2015-09-05 (×2): qty 2

## 2015-09-05 MED ORDER — INSULIN ASPART 100 UNIT/ML ~~LOC~~ SOLN
0.0000 [IU] | Freq: Three times a day (TID) | SUBCUTANEOUS | Status: DC
  Administered 2015-09-06: 3 [IU] via SUBCUTANEOUS
  Administered 2015-09-06: 2 [IU] via SUBCUTANEOUS

## 2015-09-05 MED ORDER — PHENOL 1.4 % MT LIQD
1.0000 | OROMUCOSAL | Status: DC | PRN
Start: 2015-09-05 — End: 2015-09-07

## 2015-09-05 MED ORDER — ACETAMINOPHEN 325 MG RE SUPP: 325.0000 mg | RECTAL | Status: DC | PRN

## 2015-09-05 MED ORDER — HEPARIN SODIUM (PORCINE) 1000 UNIT/ML IJ SOLN
INTRAMUSCULAR | Status: DC | PRN
  Administered 2015-09-05: 7000 [IU] via INTRAVENOUS
  Administered 2015-09-05: 2000 [IU] via INTRAVENOUS

## 2015-09-05 MED ORDER — 0.9 % SODIUM CHLORIDE (POUR BTL) OPTIME
TOPICAL | Status: DC | PRN
  Administered 2015-09-05: 2000 mL

## 2015-09-05 MED ORDER — SODIUM CHLORIDE 0.9 % IV SOLN
INTRAVENOUS | Status: DC | PRN
  Administered 2015-09-05: 10:00:00 via INTRAVENOUS

## 2015-09-05 MED ORDER — HYDROMORPHONE HCL 1 MG/ML IJ SOLN: 0.2500 mg | INTRAMUSCULAR | Status: DC | PRN

## 2015-09-05 MED ORDER — OXYCODONE HCL 5 MG PO TABS
5.0000 mg | ORAL_TABLET | Freq: Once | ORAL | Status: AC | PRN
  Administered 2015-09-05: 5 mg via ORAL

## 2015-09-05 MED ORDER — OXYCODONE HCL 5 MG/5ML PO SOLN: 5.0000 mg | Freq: Once | ORAL | Status: AC | PRN

## 2015-09-05 MED ORDER — ONDANSETRON HCL 4 MG/2ML IJ SOLN
INTRAMUSCULAR | Status: DC | PRN
  Administered 2015-09-05: 4 mg via INTRAVENOUS

## 2015-09-05 MED ORDER — ONDANSETRON HCL 4 MG/2ML IJ SOLN
4.0000 mg | Freq: Four times a day (QID) | INTRAMUSCULAR | Status: DC | PRN
  Administered 2015-09-06: 4 mg via INTRAVENOUS
  Filled 2015-09-05 (×2): qty 2

## 2015-09-05 MED ORDER — ASPIRIN EC 81 MG PO TBEC
81.0000 mg | DELAYED_RELEASE_TABLET | Freq: Every day | ORAL | Status: DC
Start: 2015-09-06 — End: 2015-09-07
  Administered 2015-09-06 – 2015-09-07 (×2): 81 mg via ORAL
  Filled 2015-09-05 (×2): qty 1

## 2015-09-05 MED ORDER — LIDOCAINE HCL (CARDIAC) 20 MG/ML IV SOLN
INTRAVENOUS | Status: DC | PRN
  Administered 2015-09-05: 100 mg via INTRAVENOUS

## 2015-09-05 MED ORDER — PROMETHAZINE HCL 25 MG/ML IJ SOLN: 6.2500 mg | INTRAMUSCULAR | Status: DC | PRN

## 2015-09-05 MED ORDER — LIDOCAINE 2% (20 MG/ML) 5 ML SYRINGE
INTRAMUSCULAR | Status: AC
  Filled 2015-09-05: qty 5

## 2015-09-05 MED ORDER — ROCURONIUM BROMIDE 100 MG/10ML IV SOLN
INTRAVENOUS | Status: DC | PRN
  Administered 2015-09-05: 50 mg via INTRAVENOUS

## 2015-09-05 MED ORDER — LOSARTAN POTASSIUM 50 MG PO TABS
100.0000 mg | ORAL_TABLET | Freq: Every day | ORAL | Status: DC
  Administered 2015-09-06 – 2015-09-07 (×2): 100 mg via ORAL
  Filled 2015-09-05 (×2): qty 2

## 2015-09-05 MED ORDER — VANCOMYCIN HCL IN DEXTROSE 1-5 GM/200ML-% IV SOLN
1000.0000 mg | INTRAVENOUS | Status: AC
  Administered 2015-09-05: 1000 mg via INTRAVENOUS
  Filled 2015-09-05: qty 200

## 2015-09-05 MED ORDER — INSULIN GLARGINE 100 UNIT/ML ~~LOC~~ SOLN
35.0000 [IU] | Freq: Every day | SUBCUTANEOUS | Status: DC
  Administered 2015-09-05 – 2015-09-06 (×2): 35 [IU] via SUBCUTANEOUS
  Filled 2015-09-05 (×4): qty 0.35

## 2015-09-05 MED ORDER — PHENYLEPHRINE HCL 10 MG/ML IJ SOLN
INTRAVENOUS | Status: DC | PRN
  Administered 2015-09-05: 25 ug/min via INTRAVENOUS

## 2015-09-05 MED ORDER — ISOSORBIDE MONONITRATE ER 60 MG PO TB24
90.0000 mg | ORAL_TABLET | Freq: Every day | ORAL | Status: DC
  Administered 2015-09-06 – 2015-09-07 (×2): 90 mg via ORAL
  Filled 2015-09-05 (×2): qty 1

## 2015-09-05 MED ORDER — NEOSTIGMINE METHYLSULFATE 10 MG/10ML IV SOLN
INTRAVENOUS | Status: DC | PRN
  Administered 2015-09-05: 2 mg via INTRAVENOUS

## 2015-09-05 MED ORDER — AMLODIPINE BESYLATE 5 MG PO TABS
5.0000 mg | ORAL_TABLET | Freq: Every day | ORAL | Status: DC
  Administered 2015-09-06 – 2015-09-07 (×2): 5 mg via ORAL
  Filled 2015-09-05 (×2): qty 1

## 2015-09-05 MED ORDER — PROPOFOL 10 MG/ML IV BOLUS
INTRAVENOUS | Status: DC | PRN
Start: 2015-09-05 — End: 2015-09-05
  Administered 2015-09-05: 100 mg via INTRAVENOUS

## 2015-09-05 MED ORDER — HYDROMORPHONE HCL 1 MG/ML IJ SOLN
INTRAMUSCULAR | Status: AC
  Filled 2015-09-05: qty 1

## 2015-09-05 MED ORDER — INSULIN GLARGINE 100 UNIT/ML SOLOSTAR PEN: 35.0000 [IU] | PEN_INJECTOR | Freq: Every day | SUBCUTANEOUS | Status: DC

## 2015-09-05 MED ORDER — ALBUMIN HUMAN 5 % IV SOLN
INTRAVENOUS | Status: DC | PRN
  Administered 2015-09-05: 09:00:00 via INTRAVENOUS

## 2015-09-05 MED ORDER — GABAPENTIN 300 MG PO CAPS
300.0000 mg | ORAL_CAPSULE | Freq: Two times a day (BID) | ORAL | Status: DC
  Administered 2015-09-05 – 2015-09-07 (×4): 300 mg via ORAL
  Filled 2015-09-05 (×4): qty 1

## 2015-09-05 MED ORDER — IOPAMIDOL (ISOVUE-300) INJECTION 61%
INTRAVENOUS | Status: DC | PRN
  Administered 2015-09-05: 50 mL via INTRAVENOUS

## 2015-09-05 MED ORDER — MEPERIDINE HCL 25 MG/ML IJ SOLN: 6.2500 mg | INTRAMUSCULAR | Status: DC | PRN

## 2015-09-05 SURGICAL SUPPLY — 83 items
BANDAGE ELASTIC 4 VELCRO ST LF (GAUZE/BANDAGES/DRESSINGS) IMPLANT
BANDAGE ESMARK 6X9 LF (GAUZE/BANDAGES/DRESSINGS) IMPLANT
BIOPATCH RED 1 DISK 7.0 (GAUZE/BANDAGES/DRESSINGS) ×1 IMPLANT
BIOPATCH RED 1IN DISK 7.0MM (GAUZE/BANDAGES/DRESSINGS) ×1
BNDG CMPR 9X6 STRL LF SNTH (GAUZE/BANDAGES/DRESSINGS)
BNDG ESMARK 6X9 LF (GAUZE/BANDAGES/DRESSINGS)
CANISTER SUCTION 2500CC (MISCELLANEOUS) ×3 IMPLANT
CANNULA VESSEL 3MM 2 BLNT TIP (CANNULA) ×10 IMPLANT
CATH EMB LATEX FREE 4FRX80CM (CATHETERS) ×3
CATH EMB LF 4FRX80 (CATHETERS) IMPLANT
CLIP TI MEDIUM 24 (CLIP) ×3 IMPLANT
CLIP TI WIDE RED SMALL 24 (CLIP) ×3 IMPLANT
COVER SURGICAL LIGHT HANDLE (MISCELLANEOUS) ×2 IMPLANT
CUFF TOURNIQUET SINGLE 18IN (TOURNIQUET CUFF) IMPLANT
CUFF TOURNIQUET SINGLE 24IN (TOURNIQUET CUFF) IMPLANT
CUFF TOURNIQUET SINGLE 34IN LL (TOURNIQUET CUFF) IMPLANT
CUFF TOURNIQUET SINGLE 44IN (TOURNIQUET CUFF) IMPLANT
DECANTER SPIKE VIAL GLASS SM (MISCELLANEOUS) ×2 IMPLANT
DEVICE TORQUE H2O (MISCELLANEOUS) ×2 IMPLANT
DRAIN CHANNEL 15F RND FF W/TCR (WOUND CARE) ×2 IMPLANT
DRAPE C-ARM 42X72 X-RAY (DRAPES) ×2 IMPLANT
DRAPE PROXIMA HALF (DRAPES) IMPLANT
DRAPE X-RAY CASS 24X20 (DRAPES) IMPLANT
DRSG COVADERM 4X8 (GAUZE/BANDAGES/DRESSINGS) IMPLANT
ELECT REM PT RETURN 9FT ADLT (ELECTROSURGICAL) ×3
ELECTRODE REM PT RTRN 9FT ADLT (ELECTROSURGICAL) ×1 IMPLANT
EVACUATOR SILICONE 100CC (DRAIN) ×2 IMPLANT
GAUZE SPONGE 4X4 16PLY XRAY LF (GAUZE/BANDAGES/DRESSINGS) ×2 IMPLANT
GLOVE BIO SURGEON STRL SZ7.5 (GLOVE) ×1 IMPLANT
GLOVE BIOGEL PI IND STRL 6.5 (GLOVE) IMPLANT
GLOVE BIOGEL PI IND STRL 7.0 (GLOVE) IMPLANT
GLOVE BIOGEL PI IND STRL 8 (GLOVE) ×1 IMPLANT
GLOVE BIOGEL PI INDICATOR 6.5 (GLOVE) ×8
GLOVE BIOGEL PI INDICATOR 7.0 (GLOVE) ×4
GLOVE BIOGEL PI INDICATOR 8 (GLOVE) ×2
GLOVE SURG SS PI 6.5 STRL IVOR (GLOVE) ×8 IMPLANT
GLOVE SURG SS PI 7.5 STRL IVOR (GLOVE) ×2 IMPLANT
GOWN STRL REUS W/ TWL LRG LVL3 (GOWN DISPOSABLE) ×3 IMPLANT
GOWN STRL REUS W/TWL LRG LVL3 (GOWN DISPOSABLE) ×21
GUIDEWIRE ANGLED .035X150CM (WIRE) ×2 IMPLANT
GUIDEWIRE BENTSON TYPE (WIRE) ×2 IMPLANT
KIT BASIN OR (CUSTOM PROCEDURE TRAY) ×3 IMPLANT
KIT ROOM TURNOVER OR (KITS) ×3 IMPLANT
LIQUID BAND (GAUZE/BANDAGES/DRESSINGS) ×3 IMPLANT
LOOP VESSEL MAXI BLUE (MISCELLANEOUS) ×2 IMPLANT
MARKER GRAFT CORONARY BYPASS (MISCELLANEOUS) IMPLANT
NDL 18GX1X1/2 (RX/OR ONLY) (NEEDLE) IMPLANT
NDL PERC 18GX7CM (NEEDLE) IMPLANT
NEEDLE 18GX1X1/2 (RX/OR ONLY) (NEEDLE) ×3 IMPLANT
NEEDLE PERC 18GX7CM (NEEDLE) ×3 IMPLANT
NS IRRIG 1000ML POUR BTL (IV SOLUTION) ×6 IMPLANT
PACK PERIPHERAL VASCULAR (CUSTOM PROCEDURE TRAY) ×3 IMPLANT
PAD ARMBOARD 7.5X6 YLW CONV (MISCELLANEOUS) ×4 IMPLANT
PADDING CAST COTTON 6X4 STRL (CAST SUPPLIES) IMPLANT
SET COLLECT BLD 21X3/4 12 (NEEDLE) IMPLANT
SET COLLECT BLD 21X3/4 12 PB (MISCELLANEOUS) ×2 IMPLANT
SET MICROPUNCTURE 5F STIFF (MISCELLANEOUS) ×2 IMPLANT
SHEATH AVANTI 11CM 5FR (MISCELLANEOUS) ×2 IMPLANT
SPONGE GAUZE 4X4 12PLY STER LF (GAUZE/BANDAGES/DRESSINGS) ×2 IMPLANT
SPONGE SURGIFOAM ABS GEL 100 (HEMOSTASIS) IMPLANT
STAPLER VISISTAT (STAPLE) IMPLANT
STOPCOCK 4 WAY LG BORE MALE ST (IV SETS) ×6 IMPLANT
SUT ETHILON 3 0 PS 1 (SUTURE) ×2 IMPLANT
SUT PROLENE 5 0 C 1 24 (SUTURE) ×5 IMPLANT
SUT PROLENE 5 0 C 1 36 (SUTURE) ×2 IMPLANT
SUT PROLENE 6 0 BV (SUTURE) ×7 IMPLANT
SUT PROLENE 7 0 BV 1 (SUTURE) IMPLANT
SUT SILK 2 0 FS (SUTURE) IMPLANT
SUT SILK 2 0 SH (SUTURE) ×3 IMPLANT
SUT SILK 3 0 (SUTURE)
SUT SILK 3-0 18XBRD TIE 12 (SUTURE) IMPLANT
SUT VIC AB 2-0 CTB1 (SUTURE) ×6 IMPLANT
SUT VIC AB 3-0 SH 27 (SUTURE) ×6
SUT VIC AB 3-0 SH 27X BRD (SUTURE) ×2 IMPLANT
SUT VICRYL 4-0 PS2 18IN ABS (SUTURE) ×8 IMPLANT
SYR 20CC LL (SYRINGE) ×2 IMPLANT
SYR 3ML LL SCALE MARK (SYRINGE) ×2 IMPLANT
TAPE CLOTH SURG 4X10 WHT LF (GAUZE/BANDAGES/DRESSINGS) ×2 IMPLANT
TRAY FOLEY W/METER SILVER 16FR (SET/KITS/TRAYS/PACK) ×3 IMPLANT
TUBING EXTENTION W/L.L. (IV SETS) ×2 IMPLANT
UNDERPAD 30X30 INCONTINENT (UNDERPADS AND DIAPERS) ×3 IMPLANT
WATER STERILE IRR 1000ML POUR (IV SOLUTION) ×3 IMPLANT
WIRE BENTSON .035X145CM (WIRE) ×2 IMPLANT

## 2015-09-05 NOTE — Transfer of Care (Signed)
Immediate Anesthesia Transfer of Care Note  Patient: [name] [name]  Procedure(s) Performed: Procedure(s) with comments: ENDARTERECTOMY FEMORAL WITH PROFUNDOPLASTY (Left) - Left common femoral artery vein patch using left saphenous vien INTRA OPERATIVE ARTERIOGRAM (Left)  Patient Location: PACU  Anesthesia Type:General  Level of Consciousness: awake and alert   Airway & Oxygen Therapy: Patient Spontanous Breathing and Patient connected to face mask oxygen  Post-op Assessment: Report given to RN and Post -op Vital signs reviewed and stable  Post vital signs: Reviewed and stable  Last Vitals:  Vitals:   09/05/15 0612  BP: (!) 155/56  Pulse: 79  Resp: 20  Temp: 36.8 C    Last Pain:  Vitals:   09/05/15 0629  TempSrc:   PainSc: 4          Complications: No apparent anesthesia complications

## 2015-09-05 NOTE — Anesthesia Postprocedure Evaluation (Signed)
Anesthesia Post Note  Patient: [name] [name]  Procedure(s) Performed: Procedure(s) (LRB): ENDARTERECTOMY FEMORAL WITH PROFUNDOPLASTY (Left) INTRA OPERATIVE ARTERIOGRAM (Left)  Patient location during evaluation: PACU Anesthesia Type: General Level of consciousness: sedated and patient cooperative Pain management: pain level controlled Vital Signs Assessment: post-procedure vital signs reviewed and stable Respiratory status: spontaneous breathing Cardiovascular status: stable Anesthetic complications: no    Last Vitals:  Vitals:   09/05/15 1345 09/05/15 1409  BP:    Pulse:    Resp:    Temp: 36.4 C 36.6 C    Last Pain:  Vitals:   09/05/15 1409  TempSrc: Oral  PainSc:                  Lewie Loron

## 2015-09-05 NOTE — Progress Notes (Signed)
Pharmacy Consult: Antibiotics renal dose adjustment  65 YOF s/p ENDARTERECTOMY FEMORAL WITH PROFUNDOPLASTY , getting vancomycin for post-op prophylaxis. Pharmacy is consulted for antibiotics renal dose adjustment. Scr 0.59, est. crcl 76 ml/min. Received pre-op dose vancomycin 1000 mg at 0715.  Plan: vancomycin x 2 doses as ordered, next dose due at 1800, no adjustment needed. Pharmacy sign off.   Thank you for allowing [place] to participate in this patients care. Signe Colt, PharmD Pager: [phone]

## 2015-09-05 NOTE — H&P (View-Only) (Signed)
[facility] Specialist of [facility]  Patient name: [name] [name] MRN: [id number] DOB: 1950/08/28 Sex: female  REASON FOR CONSULT: Critical limb ischemia left lower extremity. Consult is from Dr. Nanetta Batty.  HPI: [name] [name] is a 65 y.o. female, who underwent arteriography today by Dr. Nanetta Batty. She gives a long history of bilateral lower extremity claudication which has been more significant on the left side. She experiences pain in both calves and thighs and hips which is brought on by ambulation and relieved with rest. This occurs at a very short distance. She developed rest pain in her left foot approximately 3-4 weeks ago.She also states that she's had a wound on the lateral aspect of her left fifth toe for about 2 weeks.  She underwent an arteriogram today which showed multilevel arterial occlusive disease bilaterally. Given the limb threatening situation of the left lower extremity vascular surgery was consulted.  Her risk factors for peripheral vascular disease include diabetes, hypertension, hyperlipidemia, and history of tobacco use in the past.  She had a myocardial infarction in 2006. She had a PTCA at that time and has had no problems since then. She is followed by Dr. Norwood Levo and Dr. Nanetta Batty. She denies any recent chest pain or chest pressure.   Past Medical History:  Diagnosis Date  . Arthritis   . Complication of anesthesia    DIFFICULT WAKING  . Coronary artery disease   . Diabetes mellitus   . Headache(784.0)   . Hyperlipidemia LDL goal < 70 06/28/2013  . Hypertension   . Myocardial infarction (HCC)   . Neuromuscular disorder (HCC)    DIABETIC NEUROPATHY  . Peripheral vascular disease (HCC)     Family History  Problem Relation Age of Onset  . Hypertension Mother   . Heart failure Mother   . Heart failure Father   . Stroke Father   . Diabetes Father    There is no family history of premature cardiovascular disease.  SOCIAL HISTORY:  She is married. She has one child. She quit tobacco in 2006.  Social History   Social History  . Marital status: Married    Spouse name: N/A  . Number of children: N/A  . Years of education: N/A   Occupational History  . Not on file.   Social History Main Topics  . Smoking status: Former Smoker    Types: Cigarettes    Quit date: 10/04/2009  . Smokeless tobacco: Never Used  . Alcohol use No  . Drug use: No  . Sexual activity: Yes    Birth control/ protection: Surgical   Other Topics Concern  . Not on file   Social History Narrative  . No narrative on file    Allergies  Allergen Reactions  . Hydrochlorothiazide   . Latex Rash  . Penicillins Swelling and Rash    Has patient had a PCN reaction causing immediate rash, facial/tongue/throat swelling, SOB or lightheadedness with hypotension: Yes Has patient had a PCN reaction causing severe rash involving mucus membranes or skin necrosis: No Has patient had a PCN reaction that required hospitalization No Has patient had a PCN reaction occurring within the last 10 years: No If all of the above answers are "NO", then may proceed with Cephalosporin use.     Current Facility-Administered Medications  Medication Dose Route Frequency Provider Last Rate Last Dose  . 0.9 %  sodium chloride infusion   Intravenous Continuous Runell Gess, MD      .  acetaminophen (TYLENOL) tablet 650 mg  650 mg Oral Q4H PRN Runell Gess, MD      . amLODipine (NORVASC) tablet 5 mg  5 mg Oral Daily Runell Gess, MD      . Melene Muller ON 08/29/2015] aspirin EC tablet 81 mg  81 mg Oral Daily Runell Gess, MD      . atorvastatin (LIPITOR) tablet 80 mg  80 mg Oral q1800 Runell Gess, MD      . Melene Muller ON 08/29/2015] clopidogrel (PLAVIX) tablet 75 mg  75 mg Oral Q breakfast Runell Gess, MD      . gabapentin (NEURONTIN) capsule 300 mg  300 mg Oral BID Runell Gess, MD      . hydrALAZINE (APRESOLINE) injection 10 mg  10 mg Intravenous Q6H PRN  Runell Gess, MD      . insulin glargine (LANTUS) injection 35 Units  35 Units Subcutaneous Q2200 Runell Gess, MD      . isosorbide mononitrate (IMDUR) 24 hr tablet 90 mg  90 mg Oral Daily Runell Gess, MD      . losartan (COZAAR) tablet 100 mg  100 mg Oral Daily Runell Gess, MD      . Melene Muller ON 08/31/2015] metFORMIN (GLUCOPHAGE) tablet 1,000 mg  1,000 mg Oral BID WC Runell Gess, MD      . metoprolol succinate (TOPROL-XL) 24 hr tablet 50 mg  50 mg Oral Daily Runell Gess, MD      . morphine 2 MG/ML injection 2 mg  2 mg Intravenous Q1H PRN Runell Gess, MD      . ondansetron [facility]) injection 4 mg  4 mg Intravenous Q6H PRN Runell Gess, MD      . pantoprazole (PROTONIX) EC tablet 40 mg  40 mg Oral Daily Runell Gess, MD        REVIEW OF SYSTEMS:  [X]  denotes positive finding, [ ]  denotes negative finding Cardiac  Comments:  Chest pain or chest pressure:    Shortness of breath upon exertion: X   Short of breath when lying flat:    Irregular heart rhythm:        Vascular    Pain in calf, thigh, or hip brought on by ambulation: X Bilateral calf thigh and hip.  Pain in feet at night that wakes you up from your sleep:  X Left foot  Blood clot in your veins:    Leg swelling:         Pulmonary    Oxygen at home:    Productive cough:     Wheezing:         Neurologic    Sudden weakness in arms or legs:     Sudden numbness in arms or legs:     Sudden onset of difficulty speaking or slurred speech:    Temporary loss of vision in one eye:     Problems with dizziness:         Gastrointestinal    Blood in stool:     Vomited blood:         Genitourinary    Burning when urinating:     Blood in urine:        Psychiatric    Major depression:         Hematologic    Bleeding problems:    Problems with blood clotting too easily:        Skin    Rashes or  ulcers: X Left fifth toe      Constitutional    Fever or chills:      PHYSICAL  EXAM: Vitals:   08/28/15 0925 08/28/15 0930 08/28/15 0935 08/28/15 0935  BP: (!) 204/101 (!) 198/98  (!) 193/107  Pulse: 95 92 (!) 118 96  Resp: 12 (!) 9 14 (!) 0  Temp:      TempSrc:      SpO2: 98% 98% 98% 99%  Weight:      Height:        GENERAL: The patient is a well-nourished female, in no acute distress. The vital signs are documented above. CARDIAC: There is a regular rate and rhythm.  VASCULAR: I do not detect carotid bruits. She has dressings on both groins and I cannot assess her femoral pulses. I cannot palpate popliteal or pedal pulses. She has mild bilateral lower extremity swelling more significant on the right side. PULMONARY: There is good air exchange bilaterally without wheezing or rales. ABDOMEN: Soft and non-tender with normal pitched bowel sounds.  MUSCULOSKELETAL: There are no major deformities or cyanosis. NEUROLOGIC: No focal weakness or paresthesias are detected. SKIN: There are no ulcers or rashes noted. PSYCHIATRIC: The patient has a normal affect.  DATA:   ARTERIOGRAM: I have reviewed her arteriogram that was performed today. She has single renal arteries bilaterally with no significant renal artery stenosis. The infrarenal aorta is widely patent.  On the left side, which is the site of concern, there is mild disease in the proximal left common iliac artery. There was significant plaque at the bifurcation of the left common iliac artery involving the proximal hypogastric artery and proximal external iliac artery. This was successfully addressed with atherectomy, balloon angioplasty, and stenting. The catheter was essentially occlusive on the left. The common femoral artery is occluded with a patent deep femoral artery. The superficial femoral artery is occluded with poor visualization distally.  On the right side, there is extensive plaque in the common femoral artery. The superficial femoral artery is occluded. There does appear to be a patent posterior  tibial artery on the right.  ARTERIAL DOPPLER STUDY ON 08/24/2015:  ABI is 0% on the left is no Doppler signals could be obtained of the left foot. ABI on the right was 28%.  CAROTID DUPLEX: Carotid duplex scan in September 2016 showed a chronically occludedright internal carotid artery with less than 39% left carotid stenosis.   MEDICAL ISSUES:  CRITICAL LIMB ISCHEMIA LEFT LOWER EXTREMITY: This patient has multilevel arterial occlusive disease with a nonhealing wound of the left foot. This is clearly a limb threatening situation. Without attempted revascularization she would require proximal amputation. I think the best option would be left femoral endarterectomy with profundoplasty and intraoperative arteriography. If she has a distal target for bypass she could potentially also require bypass. She has not previously had her vein taken from either leg. A will look at her vein at the time of surgery. She could potentially require placement of a prosthetic graft. I have reviewed the indications for lower extremity bypass. I have also reviewed the potential complications of surgery including but not limited to: wound healing problems, infection, graft thrombosis, limb loss, or other unpredictable medical problems. All the patient's questions were answered and they are agreeable to proceed. Her surgery is scheduled for 09/05/15.     Waverly Ferrari [facility] [place] [phone]

## 2015-09-05 NOTE — Interval H&P Note (Signed)
History and Physical Interval Note:  09/05/2015 7:24 AM  [name] [name]  has presented today for surgery, with the diagnosis of Peripheral vascular disease with left fifth toe ulcer I70.245  The various methods of treatment have been discussed with the patient and family. After consideration of risks, benefits and other options for treatment, the patient has consented to  Procedure(s): ENDARTERECTOMY FEMORAL WITH PROFUNDOPLASTY (Left) BYPASS GRAFT FEMORAL-TIBIAL ARTERY (Left) as a surgical intervention .  The patient's history has been reviewed, patient examined, no change in status, stable for surgery.  I have reviewed the patient's chart and labs.  Questions were answered to the patient's satisfaction.     Waverly Ferrari

## 2015-09-05 NOTE — Op Note (Signed)
NAME: [name] [name]    MRN: [id number] DOB: February 02, 1950    DATE OF OPERATION: 09/05/2015  PREOP DIAGNOSIS: critical limb ischemia left lower extremity with multilevel arterial occlusive disease  POSTOP DIAGNOSIS: same  PROCEDURE:  1. Left common femoral artery endarterectomy and profundoplasty with vein patch angioplasty of left common femoral artery and deep femoral artery using left greater saphenous vein 2. Intraoperative arteriogram  SURGEON: Di Kindle. Edilia Bo, MD, FACS  ASSIST: Karsten Ro, [facility]  ANESTHESIA: Gen.   EBL: per anesthesia record.  INDICATIONS: [name] [name] is a 66 y.o. female who is followed by Dr. Nanetta Batty. She has a nonhealing wound on her left foot and critical limb ischemia. She underwent an arteriogram  Which showed diffuse multilevel arterial occlusive disease. On the left side she required atherectomy, angioplasty and stenting of the left common iliac artery and external iliac artery. Below that, it looked like the external iliac artery was patent with an occlusion of the common femoral artery and reconstitution of the deep femoral artery. The superficial femoral artery was occluded. There was poor visualization distally that she appeared to have severe tibial artery occlusive disease also. Was felt that her only chance for limb salvage was attempted revascularization.  FINDINGS: The patient had diffuse disease of the external iliac artery and diffuse calcific disease making the external iliac artery impossible to clamp. At the completion of the procedure there was a diminished left femoral pulse suggesting that she still had residual external iliac disease which I could not address. I did not proceed with femoropopliteal bypass grafting given the compromised inflow. She did have good flow in the deep femoral artery after the completion of the procedure.   TECHNIQUE: The patient was taken to the operating room and received a general anesthetic. The lower  abdomen and entire left lower extremity were prepped and draped in the usual sterile fashion. Given her obesity, I elected to make an oblique incision above the inguinal crease. Through this incision was able to expose the common femoral artery which was markedly calcified. I extended dissection well up under the inguinal ligament where the artery remained markedly calcified and I did not think I would be able to clamp it. I was dissected up as high as I could possibly reach. The disease did not appear to end.  I then extended the dissection distally exposing the 2 deep femoral artery branches in the proximal superficial femoral artery. There was also a posterior branch at the bifurcation which was controlled. The feeling was that I would be able to control  The artery with a Fogarty balloon  As I'll distally was not able to clamp the artery proximally.  Through the same incision, the saphenofemoral junction was dissected free. The vein was bifid. I harvested both branches to the proximal 5.  The lateral branch was smaller and this was harvested to the proximal 5 where was ligated distally  And proximally and excised and distended with heparinized saline. I felt that I could use this for the vein patch on the common femoral artery. The more medial branch appeared to be the main vein and this was preserved.  The patient was then heparinized.  Next I controlled the vessels distally and was not able to control them proximally as noted above. I made an arteriotomy with a 15 blade and the artery and there was bleeding.. I attempted to pass a 30 catheter but this was not successful and really had no way to control  the artery. I therefore elected to extend the arteriotomy and endarterectomize a short segment to allow me to clamp the artery. After I had accomplished this I was then able to pass the Fogarty proximally for proximal control. The endarterectomy was extended proximally and distally and tapered in the deep  femoral artery. The superficial femoral artery was occluded although I endarterectomize the proximal segment. Proximally in the external iliac artery I endarterectomize this is high xiphoid could possibly reach below the balloon occlusion. However there was still plaque above this level. The artery was irrigated with heparin and all loose debris removed. The vein patch was inserted and using continuous 5-0 Prolene suture. Prior to completing the closure the arteries were backbled and flushed. The anastomosis was completed. At this point there was a good Doppler signal in the deep femoral artery but I was not happy with the pulse in the groin I suspected that there was proximal disease. Therefore I attempted to perform an arteriogram but was unable to pass a wire through the external iliac artery from below. I tried both a Transport planner and an Production designer, theatre/television/film. Therefore I did not think I would be able to address the proximal disease with angioplasty and stenting.  I did shoot an intraoperative arteriogram through the patch distally to try to visualize the tibial vessels but there was poor visualization because of her multilevel disease. There appeared to be a patent diseased below-knee popliteal artery and peroneal runoff only.  Hemostasis was obtained in the wounds and a 15 Blake drain was placed. The vein harvest incision in the proximal thigh was closed with deeper 3-0 Vicryl and the skin closed with 4-0 Vicryl. The groin incision was closed with a deep 2-0 Vicryl, the subcutaneous layer with 3-0 Vicryl and the skin closed with 4-0 Vicryl.  Sterile dressing was applied. The patient tolerated the procedure well and transferred to the recovery room in stable condition. All needle and sponge counts were correct.  CLINICAL NOTE: I have had an extensive discussion with the family about the extent of her disease. The only further options I see for revascularization would be potentially radial artery  catheterization to try would dress the external iliac artery disease on the left and if this were not possible she could be considered for axillobifemoral bypass grafting. I would not want to do the femoropopliteal bypass unless the inflow could be improved and even this may be challenging given her severe tibial disease and diseased below-knee popliteal artery.  Waverly Ferrari, MD, FACS [facility] of [facility]  DATE OF DICTATION:   09/05/2015

## 2015-09-05 NOTE — Progress Notes (Addendum)
  Day of Surgery Note    Subjective:  Says she doesn't have much pain  Vitals:   09/05/15 1409 09/05/15 1411  BP:  (!) 115/50  Pulse:  67  Resp:  (!) 23  Temp: 97.9 F (36.6 C)     Incisions:   Left groin incision is clean and dry Extremities:  + left peroneal doppler signal  Cardiac:  regular Lungs:  Non labored   Assessment/Plan:  This is a 65 y.o. female who is s/p  1. Left common femoral artery endarterectomy and profundoplasty with vein patch angioplasty of left common femoral artery and deep femoral artery using left greater saphenous vein 2. Intraoperative arteriogram  -pt doing well with + doppler signal left peroneal  -left groin incision is clean and dry -minimal output in JP drain   Doreatha Massed, PA-C 09/05/2015 3:48 PM [phone]  I have interviewed the patient and examined the patient. I agree with the findings by the PA. Peroneal and DP signal with doppler. Stable post op  Cari Caraway, MD [phone]

## 2015-09-05 NOTE — Care Management Note (Signed)
Case Management Note  Patient Details  Name: [name] [name] MRN: [id number] Date of Birth: 03-03-1950  Subjective/Objective:   Patient is from home s/p Left common femoral artery endarterectomy , previously was set up with [facility] but patient declined their services per [facility] rep so his case was never opened with them.  NCM will cont to follow for dc needs.                 Action/Plan:   Expected Discharge Date:                  Expected Discharge Plan:  Home/Self Care  In-House Referral:     Discharge planning Services  CM Consult  Post Acute Care Choice:    Choice offered to:     DME Arranged:    DME Agency:     HH Arranged:    HH Agency:     Status of Service:  In process, will continue to follow  If discussed at Long Length of Stay Meetings, dates discussed:    Additional Comments:  Leone Haven, RN 09/05/2015, 4:33 PM

## 2015-09-05 NOTE — Telephone Encounter (Signed)
Patient's phone number, she did not answer and mailbox was full. Called her daughter, ok per DPR, gave her patient's appt time and date for 09/12/15 and she verbalized understanding and will let the patient know.

## 2015-09-05 NOTE — Anesthesia Procedure Notes (Signed)
Procedure Name: Intubation Date/Time: 09/05/2015 7:34 AM Performed by: Glo Herring B Pre-anesthesia Checklist: Patient identified, Emergency Drugs available, Suction available, Patient being monitored and Timeout performed Patient Re-evaluated:Patient Re-evaluated prior to inductionOxygen Delivery Method: Circle system utilized Preoxygenation: Pre-oxygenation with 100% oxygen Intubation Type: IV induction Ventilation: Mask ventilation without difficulty Laryngoscope Size: Mac and 3 Grade View: Grade I Tube type: Oral Tube size: 7.0 mm Number of attempts: 1 Airway Equipment and Method: Stylet Placement Confirmation: ETT inserted through vocal cords under direct vision,  positive ETCO2,  CO2 detector and breath sounds checked- equal and bilateral Secured at: 21 cm Tube secured with: Tape Dental Injury: Teeth and Oropharynx as per pre-operative assessment

## 2015-09-06 ENCOUNTER — Encounter (HOSPITAL_COMMUNITY): Payer: Self-pay | Admitting: Vascular Surgery

## 2015-09-06 LAB — BASIC METABOLIC PANEL
Anion gap: 4 — ABNORMAL LOW (ref 5–15)
BUN: 8 mg/dL (ref 6–20)
CHLORIDE: 108 mmol/L (ref 101–111)
CO2: 26 mmol/L (ref 22–32)
Calcium: 8.4 mg/dL — ABNORMAL LOW (ref 8.9–10.3)
Creatinine, Ser: 0.57 mg/dL (ref 0.44–1.00)
GFR calc Af Amer: >60 mL/min (ref >60)
GFR calc non Af Amer: >60 mL/min (ref >60)
GLUCOSE: 123 mg/dL — AB (ref 65–99)
POTASSIUM: 4 mmol/L (ref 3.5–5.1)
Sodium: 138 mmol/L (ref 135–145)

## 2015-09-06 LAB — CBC
HCT: 30.1 % — ABNORMAL LOW (ref 36.0–46.0)
Hemoglobin: 9.9 g/dL — ABNORMAL LOW (ref 12.0–15.0)
MCH: 29 pg (ref 26.0–34.0)
MCHC: 32.9 g/dL (ref 30.0–36.0)
MCV: 88.3 fL (ref 78.0–100.0)
Platelets: 214 10*3/uL (ref 150–400)
RBC: 3.41 MIL/uL — ABNORMAL LOW (ref 3.87–5.11)
RDW: 13.1 % (ref 11.5–15.5)
WBC: 10.6 10*3/uL — AB (ref 4.0–10.5)

## 2015-09-06 LAB — POCT I-STAT 4, (NA,K, GLUC, HGB,HCT)
GLUCOSE: 182 mg/dL — AB (ref 65–99)
Glucose, Bld: 185 mg/dL — ABNORMAL HIGH (ref 65–99)
HCT: 26 % — ABNORMAL LOW (ref 36.0–46.0)
HEMATOCRIT: 23 % — AB (ref 36.0–46.0)
HEMOGLOBIN: 7.8 g/dL — AB (ref 12.0–15.0)
Hemoglobin: 8.8 g/dL — ABNORMAL LOW (ref 12.0–15.0)
POTASSIUM: 4.4 mmol/L (ref 3.5–5.1)
Potassium: 4.4 mmol/L (ref 3.5–5.1)
SODIUM: 140 mmol/L (ref 135–145)
Sodium: 140 mmol/L (ref 135–145)

## 2015-09-06 LAB — GLUCOSE, CAPILLARY
GLUCOSE-CAPILLARY: 243 mg/dL — AB (ref 65–99)
Glucose-Capillary: 144 mg/dL — ABNORMAL HIGH (ref 65–99)
Glucose-Capillary: 191 mg/dL — ABNORMAL HIGH (ref 65–99)

## 2015-09-06 MED ORDER — CLOPIDOGREL BISULFATE 75 MG PO TABS
75.0000 mg | ORAL_TABLET | Freq: Every day | ORAL | Status: DC
  Administered 2015-09-06 – 2015-09-07 (×2): 75 mg via ORAL
  Filled 2015-09-06 (×2): qty 1

## 2015-09-06 NOTE — Progress Notes (Signed)
Report called to Koleen Nimrod for 2 [facility] room 08.

## 2015-09-06 NOTE — Evaluation (Signed)
Occupational Therapy Evaluation Patient Details Name: [name] [name] MRN: [id number] DOB: Sep 10, 1950 Today's Date: 09/06/2015    History of Present Illness [name] [name] is a 65 yo female who was admitted  s/p left common femoral artery endarterectomy and profundoplasty with vein patch angioplasty of left common femoral artery and deep femoral artery using left greater saphenous vein. PMH includes DM, PVD, PAD, previous MI, HTN, HLD, CAD, and arthritis.   Clinical Impression   Pt reports she required min assist from daughter with ADL PTA. Currently pt requires mod assist +2 for functional mobility and min assist for ADL. Pt planning to d/c home with 24/7 supervision from her daughter. Recommending HHOT for follow up in order to maximize independence and safety with ADL and functional mobility upon return home. Pt would benefit from continued skilled OT to address established goals.    Follow Up Recommendations  Home health OT;Supervision/Assistance - 24 hour    Equipment Recommendations  Other (comment) (AE)    Recommendations for Other Services       Precautions / Restrictions Precautions Precautions: Fall Restrictions Weight Bearing Restrictions: No LLE Weight Bearing: Weight bearing as tolerated      Mobility Bed Mobility Overal bed mobility: Needs Assistance Bed Mobility: Supine to Sit     Supine to sit: Min assist;HOB elevated     General bed mobility comments: Min assist for LLE to EOB. Cues for hand placement and technique.  Transfers Overall transfer level: Needs assistance Equipment used: 2 person hand held assist Transfers: Sit to/from Stand Sit to Stand: Mod assist;+2 physical assistance;+2 safety/equipment         General transfer comment: Mod assist to boost up from EOB with 2 person hand held assist to steady in standing. VCs for hand placement and technique.    Balance Overall balance assessment: Needs assistance Sitting-balance support: Feet  supported;No upper extremity supported Sitting balance-Leahy Scale: Good Sitting balance - Comments: Pt able to don L sock EOB, with min guard assist   Standing balance support: Bilateral upper extremity supported Standing balance-Leahy Scale: Poor Standing balance comment: Pt with mod 2 person HHA to maintain standing balance due to pain and weakness LLE                            ADL Overall ADL's : Needs assistance/impaired Eating/Feeding: Set up;Sitting   Grooming: Min guard;Sitting   Upper Body Bathing: Set up;Min guard;Sitting   Lower Body Bathing: Minimal assistance;Sit to/from stand   Upper Body Dressing : Set up;Min guard;Sitting Upper Body Dressing Details (indicate cue type and reason): for hospital gown Lower Body Dressing: Minimal assistance;Sit to/from stand Lower Body Dressing Details (indicate cue type and reason): Pt able to don sock sitting EOB; expect she would require assist to pull up clothing in standing Toilet Transfer: +2 for physical assistance;+2 for safety/equipment;Ambulation;BSC;Moderate assistance Toilet Transfer Details (indicate cue type and reason): Simulated by sit to stand from EOB with mobility in room Toileting- Clothing Manipulation and Hygiene: Minimal assistance;Sit to/from stand       Functional mobility during ADLs: +2 for physical assistance;+2 for safety/equipment;Moderate assistance (2 person hand held assist) General ADL Comments: VSS throughout functional activities.     Vision Vision Assessment?: No apparent visual deficits   Perception     Praxis      Pertinent Vitals/Pain Pain Assessment: Faces Faces Pain Scale: Hurts even more Pain Location: LLE Pain Descriptors / Indicators: Aching;Grimacing Pain Intervention(s): Monitored  during session;Repositioned     Hand Dominance Right   Extremity/Trunk Assessment Upper Extremity Assessment Upper Extremity Assessment: Overall WFL for tasks assessed   Lower  Extremity Assessment Lower Extremity Assessment: Defer to PT evaluation LLE:  (s/p surgery)   Cervical / Trunk Assessment Cervical / Trunk Assessment: Kyphotic   Communication Communication Communication: No difficulties   Cognition Arousal/Alertness: Awake/alert Behavior During Therapy: WFL for tasks assessed/performed Overall Cognitive Status: Within Functional Limits for tasks assessed                     General Comments       Exercises       Shoulder Instructions      Home Living Family/patient expects to be discharged to:: Private residence Living Arrangements: Children Available Help at Discharge: Family;Available PRN/intermittently Type of Home: House Home Access: Stairs to enter [employer] of Steps: 3 Entrance Stairs-Rails: Left Home Layout: One level     Bathroom Shower/Tub: Chief Strategy Officer: Standard     Home Equipment: Environmental consultant - 2 wheels;Walker - 4 wheels;Shower seat;Bedside commode          Prior Functioning/Environment Level of Independence: Needs assistance  Gait / Transfers Assistance Needed: RW for ambulation ADL's / Homemaking Assistance Needed: Assistance with dressing, bathing, cooking, and cleaning   Comments: Daughter assists with ADLs    OT Diagnosis: Generalized weakness;Acute pain   OT Problem List: Decreased strength;Decreased activity tolerance;Impaired balance (sitting and/or standing);Decreased knowledge of use of DME or AE;Decreased knowledge of precautions;Pain   OT Treatment/Interventions: Self-care/ADL training;Energy conservation;DME and/or AE instruction;Therapeutic activities;Patient/family education;Balance training    OT Goals(Current goals can be found in the care plan section) Acute Rehab OT Goals Patient Stated Goal: go home OT Goal Formulation: With patient Time For Goal Achievement: 09/20/15 Potential to Achieve Goals: Good ADL Goals Pt Will Perform Grooming: with  supervision;standing Pt Will Perform Lower Body Bathing: with supervision;with adaptive equipment;sit to/from stand Pt Will Perform Lower Body Dressing: with supervision;with adaptive equipment;sit to/from stand Pt Will Transfer to Toilet: with supervision;ambulating;bedside commode Pt Will Perform Tub/Shower Transfer: Tub transfer;with min guard assist;ambulating;shower seat;rolling walker  OT Frequency: Min 2X/week   Barriers to D/C:            Co-evaluation PT/OT/SLP Co-Evaluation/Treatment: Yes Reason for Co-Treatment: Complexity of the patient's impairments (multi-system involvement) PT goals addressed during session: Mobility/safety with mobility;Balance OT goals addressed during session: ADL's and self-care;Other (comment) (functional mobility)      End of Session Equipment Utilized During Treatment: Gait belt  Activity Tolerance: Patient limited by pain Patient left: in chair;with call bell/phone within reach;with family/visitor present   Time: 1031-1102 OT Time Calculation (min): 31 min Charges:  OT General Charges $OT Visit: 1 Procedure OT Evaluation $OT Eval Moderate Complexity: 1 Procedure G-Codes:     Gaye Alken M.S., OTR/L Pager: [phone]  09/06/2015, 1:30 PM

## 2015-09-06 NOTE — Care Management Note (Signed)
Case Management Note  Patient Details  Name: [name] [name] MRN: [id number] Date of Birth: 1950-04-09  Subjective/Objective:    Patient is s/p left common femoral artery endarterectomy, Previously was set up with [facility] but declined their services.  Patient states she lives with her daughter who works two jobs but can help her if needed.  She has Medicare as her primary insurance, she has a PCP and no problem with getting medications, she has transportation at discharge. Await pt eval.                  Action/Plan:   Expected Discharge Date:                  Expected Discharge Plan:  Home/Self Care  In-House Referral:     Discharge planning Services  CM Consult  Post Acute Care Choice:    Choice offered to:     DME Arranged:    DME Agency:     HH Arranged:    HH Agency:     Status of Service:  In process, will continue to follow  If discussed at Long Length of Stay Meetings, dates discussed:    Additional Comments:  Leone Haven, RN 09/06/2015, 10:12 AM

## 2015-09-06 NOTE — Evaluation (Signed)
Physical Therapy Evaluation Patient Details Name: [name] [name] MRN: [id number] DOB: 09/28/1950 Today's Date: 09/06/2015   History of Present Illness  [name] [name] is a 65 yo female who was admitted  s/p left common femoral artery endarterectomy and profundoplasty with vein patch angioplasty of left common femoral artery and deep femoral artery using left greater saphenous vein. PMH includes DM, PVD, PAD, previous MI, HTN, HLD, CAD, and arthritis.  Clinical Impression  Pt admitted s/p L femoral artery endarterectomy, and presents with the deficits listed below. Pt tolerated ambulation with 2 person mod HHA 15 feet limited by pain. Would benefit from RW for ambulation next session. Pt lives at home with daughter who provides assistance for ADLs at baseline. Daughter present during session attests that she can provide assistance for pt upon discharge. PT recommends HH PT to address deficits of decreased strength, mobility, and balance for safe return home. Continue acute follow.    Follow Up Recommendations Home health PT;Supervision for mobility/OOB    Equipment Recommendations  None recommended by PT    Recommendations for Other Services       Precautions / Restrictions Precautions Precautions: Fall Restrictions Weight Bearing Restrictions: No LLE Weight Bearing: Weight bearing as tolerated      Mobility  Bed Mobility Overal bed mobility: Needs Assistance Bed Mobility: Supine to Sit     Supine to sit: Min assist;HOB elevated     General bed mobility comments: Pt able to initiate trunk elevation but required verbal cues for hand placement, LE management, and for scooting to EOB. Increased time and effort, increased pain in LLE surgical site.  Transfers Overall transfer level: Needs assistance Equipment used: 2 person hand held assist Transfers: Sit to/from Stand Sit to Stand: Mod assist;+2 physical assistance;+2 safety/equipment         General transfer comment: x 2  trials. Pt required mod A for initial power up from EOB due to pain and weakness in LLE. Demonstrated L knee buckling upon standing.  Required mod A for power up from wheelchair, with 2 person HHA, with decreased knee buckling.   Ambulation/Gait Ambulation/Gait assistance: Mod assist;+2 physical assistance (Hand held assist) Ambulation Distance (Feet): 15 Feet Assistive device: 2 person hand held assist Gait Pattern/deviations: Step-to pattern;Decreased stride length;Decreased weight shift to left;Trunk flexed     General Gait Details: Pt with decreased weight accepetance to L LE due to wound on L 5th toe and pain in surgical site. Reliance of 2 person mod HHA with flexed trunk.  HR up to 132 bpm during activity.  Stairs            Wheelchair Mobility    Modified Rankin (Stroke Patients Only)       Balance Overall balance assessment: Needs assistance Sitting-balance support: Feet supported;No upper extremity supported Sitting balance-Leahy Scale: Good Sitting balance - Comments: Pt able to don L sock EOB, with min guard assist   Standing balance support: Bilateral upper extremity supported Standing balance-Leahy Scale: Poor Standing balance comment: Pt with mod 2 person HHA to maintain standing balance due to pain and weakness LLE                             Pertinent Vitals/Pain Pain Assessment: Faces Faces Pain Scale: Hurts even more Pain Location: LLE Pain Descriptors / Indicators: Aching;Grimacing Pain Intervention(s): Monitored during session;Repositioned    Home Living Family/patient expects to be discharged to:: Private residence Living Arrangements: Children Available  Help at Discharge: Family;Available PRN/intermittently Type of Home: House Home Access: Stairs to enter Entrance Stairs-Rails: Left Entrance Stairs-Number of Steps: 3 Home Layout: One level Home Equipment: Walker - 2 wheels;Walker - 4 wheels;Shower seat;Bedside commode       Prior Function Level of Independence: Needs assistance   Gait / Transfers Assistance Needed: RW for ambulation  ADL's / Homemaking Assistance Needed: Assistance with dressing, bathing, cooking, and cleaning  Comments: Daughter assists with ADLs     Hand Dominance   Dominant Hand: Right    Extremity/Trunk Assessment   Upper Extremity Assessment: Overall WFL for tasks assessed           Lower Extremity Assessment: Generalized weakness (s/p L LE surgery)      Cervical / Trunk Assessment: Kyphotic  Communication   Communication: No difficulties  Cognition Arousal/Alertness: Awake/alert Behavior During Therapy: WFL for tasks assessed/performed Overall Cognitive Status: Within Functional Limits for tasks assessed                      General Comments General comments (skin integrity, edema, etc.): PT educated on importance of maintaining L knee extension while in bed and recliner to prevent contractures.    Exercises        Assessment/Plan    PT Assessment Patient needs continued PT services  PT Diagnosis Difficulty walking;Abnormality of gait;Generalized weakness;Acute pain   PT Problem List Decreased strength;Decreased range of motion;Decreased balance;Decreased activity tolerance;Decreased mobility;Decreased coordination;Decreased knowledge of use of DME;Decreased safety awareness;Pain  PT Treatment Interventions DME instruction;Gait training;Stair training;Functional mobility training;Therapeutic activities;Therapeutic exercise;Balance training;Neuromuscular re-education;Patient/family education   PT Goals (Current goals can be found in the Care Plan section) Acute Rehab PT Goals Patient Stated Goal: go home PT Goal Formulation: With patient/family Time For Goal Achievement: 09/13/15 Potential to Achieve Goals: Good    Frequency Min 3X/week   Barriers to discharge        Co-evaluation PT/OT/SLP Co-Evaluation/Treatment: Yes Reason for  Co-Treatment: Complexity of the patient's impairments (multi-system involvement) PT goals addressed during session: Mobility/safety with mobility;Balance OT goals addressed during session: ADL's and self-care;Other (comment) (functional mobility)       End of Session Equipment Utilized During Treatment: Gait belt Activity Tolerance: Patient limited by pain Patient left: in chair;with call bell/phone within reach;with family/visitor present           Time: 1031-1105 PT Time Calculation (min) (ACUTE ONLY): 34 min   Charges:   PT Evaluation $PT Eval Moderate Complexity: 1 Procedure     PT G Codes:        Caliana Spires 2015-09-08, 1:41 PM  Park Liter, SPT (student physical therapist) Acute Rehabilitation Services [phone]

## 2015-09-06 NOTE — Progress Notes (Signed)
  [facility] Progress Note  Subjective  - POD #1  Doing ok.  Objective Vitals:   09/05/15 2335 09/06/15 0410  BP: (!) 129/49 (!) 111/52  Pulse: 84 72  Resp: 19 16  Temp: 98.3 F (36.8 C) 98.3 F (36.8 C)    Intake/Output Summary (Last 24 hours) at 09/06/15 0720 Last data filed at 09/06/15 0600  Gross per 24 hour  Intake          4018.75 ml  Output             4535 ml  Net          -516.25 ml   Left groin and proximal thigh incisions clean.  JP drain left thigh with minimal output.  Dry ulcer lateral to left 5th toe. Peroneal, DP and PT doppler flow  Assessment/Planning: 65 y.o. female is s/p: 1. Left common femoral artery endarterectomy and profundoplasty with vein patch angioplasty of left common femoral artery and deep femoral artery using left greater saphenous vein 2. Intraoperative arteriogram  1 Day Post-Op   Doing well this am.  ABLA stable.  D/c drain due to low output.  Transfer to 2W. Mobilize today with PT.   Raymond Gurney 09/06/2015 7:20 AM --  Laboratory CBC    Component Value Date/Time   WBC 10.6 (H) 09/06/2015 0149   HGB 9.9 (L) 09/06/2015 0149   HCT 30.1 (L) 09/06/2015 0149   PLT 214 09/06/2015 0149    BMET    Component Value Date/Time   NA 138 09/06/2015 0149   K 4.0 09/06/2015 0149   CL 108 09/06/2015 0149   CO2 26 09/06/2015 0149   GLUCOSE 123 (H) 09/06/2015 0149   BUN 8 09/06/2015 0149   CREATININE 0.57 09/06/2015 0149   CREATININE 0.61 08/24/2015 1504   CALCIUM 8.4 (L) 09/06/2015 0149   GFRNONAA >60 09/06/2015 0149   GFRAA >60 09/06/2015 0149    COAG Lab Results  Component Value Date   INR 1.13 09/05/2015   INR 1.0 08/24/2015   INR 1.06 03/04/2013   No results found for: PTT  Antibiotics Anti-infectives    Start     Dose/Rate Route Frequency Ordered Stop   09/05/15 1800  vancomycin (VANCOCIN) IVPB 1000 mg/200 mL premix     1,000 mg 200 mL/hr over 60 Minutes Intravenous Every 12 hours  09/05/15 1412 09/06/15 0614   09/05/15 0617  vancomycin (VANCOCIN) IVPB 1000 mg/200 mL premix     1,000 mg 200 mL/hr over 60 Minutes Intravenous 60 min pre-op 09/05/15 0617 09/05/15 0815       Maris Berger, PA-C [facility] Office: [phone] Pager: [phone] 09/06/2015 7:20 AM

## 2015-09-07 ENCOUNTER — Encounter (HOSPITAL_COMMUNITY): Payer: Medicare Other

## 2015-09-07 ENCOUNTER — Telehealth: Payer: Self-pay | Admitting: Vascular Surgery

## 2015-09-07 LAB — GLUCOSE, CAPILLARY
GLUCOSE-CAPILLARY: 108 mg/dL — AB (ref 65–99)
GLUCOSE-CAPILLARY: 158 mg/dL — AB (ref 65–99)

## 2015-09-07 MED ORDER — CIPROFLOXACIN HCL 500 MG PO TABS: 500.0000 mg | ORAL_TABLET | Freq: Two times a day (BID) | ORAL | 0 refills | Status: DC

## 2015-09-07 MED ORDER — ONDANSETRON 4 MG PO TBDP
8.0000 mg | ORAL_TABLET | Freq: Once | ORAL | Status: AC
  Administered 2015-09-07: 8 mg via ORAL
  Filled 2015-09-07: qty 2

## 2015-09-07 MED ORDER — OXYCODONE-ACETAMINOPHEN 5-325 MG PO TABS: 1.0000 | ORAL_TABLET | Freq: Four times a day (QID) | ORAL | 0 refills | Status: DC | PRN

## 2015-09-07 MED ORDER — ONDANSETRON 8 MG PO TBDP
8.0000 mg | ORAL_TABLET | Freq: Three times a day (TID) | ORAL | 0 refills | Status: DC | PRN
Start: 2015-09-07 — End: 2015-09-29

## 2015-09-07 NOTE — Care Management Important Message (Signed)
Important Message  Patient Details  Name: EMJAY MONTIETH MRN: [id number] Date of Birth: [birth year]   Medicare Important Message Given:  Yes    Bernadette Hoit 09/07/2015, 8:25 AM

## 2015-09-07 NOTE — Telephone Encounter (Signed)
Sched appt 8/23 at 2:30. Hm# has no vm, work# is not working, daughter's vm is full, mailed appt letter to inform pt of appt.

## 2015-09-07 NOTE — Progress Notes (Signed)
Reviewed discharge instructions with patient. No issues at present. IV removed. Awaiting wheelchair for discharge.   Klaus Casteneda, Charlaine Dalton RN

## 2015-09-07 NOTE — Telephone Encounter (Signed)
-----   Message from Sharee Pimple, RN sent at 09/07/2015 11:30 AM EDT ----- Regarding: schedule 2 weeks   ----- Message ----- From: Dara Lords, PA-C Sent: 09/07/2015   7:30 AM To: Vvs Charge Pool  S/p 1. Left common femoral artery endarterectomy and profundoplasty with vein patch angioplasty of left common femoral artery and deep femoral artery using left greater saphenous vein 2. Intraoperative arteriogram  F/u with [facility] in 2 weeks.  Thanks, Lelon Mast

## 2015-09-07 NOTE — Progress Notes (Signed)
Occupational Therapy Treatment Patient Details Name: [name] [name] MRN: [id number] DOB: 1950-07-10 Today's Date: 09/07/2015    History of present illness [name] [name] is a 65 yo female who was admitted  s/p left common femoral artery endarterectomy and profundoplasty with vein patch angioplasty of left common femoral artery and deep femoral artery using left greater saphenous vein. PMH includes DM, PVD, PAD, previous MI, HTN, HLD, CAD, and arthritis.   OT comments  Pt making good progress with Occupational Therapy now requiring min assist to min guard with most ADLS.  Pt fatigues quickly and will need 24/7 assist for first few days to ensure her safety with all adls. Pt off of O2 and O2 sats remained 93-96% throughout session.  Sister is coming today to stay with pt throughout the weekend.  Pt has now agreed to [facility] and PT and social work has been notified of this change.     Follow Up Recommendations  Home health OT;Supervision/Assistance - 24 hour    Equipment Recommendations  None recommended by OT    Recommendations for Other Services      Precautions / Restrictions Precautions Precautions: Fall Precaution Comments: talked to pt about making sure someone is with her when walking at all times for first few days. Restrictions Weight Bearing Restrictions: No LLE Weight Bearing: Weight bearing as tolerated       Mobility Bed Mobility Overal bed mobility: Needs Assistance Bed Mobility: Supine to Sit     Supine to sit: Supervision     General bed mobility comments: bed flat but used bedrail. Pt states she sleeps on the sofa at home.  Transfers Overall transfer level: Needs assistance Equipment used: 1 person hand held assist Transfers: Sit to/from Stand Sit to Stand: Min guard         General transfer comment: Pt doing much better with transfers today requiring much less physical assist but still needs +1 assist for to steady herself.    Balance Overall balance  assessment: Needs assistance Sitting-balance support: Feet supported Sitting balance-Leahy Scale: Normal     Standing balance support: Bilateral upper extremity supported;During functional activity Standing balance-Leahy Scale: Poor Standing balance comment: Pt required walker at all times and occasional min guard to remain standing due to legs feeling shaky and weak.                   ADL Overall ADL's : Needs assistance/impaired Eating/Feeding: Independent;Sitting   Grooming: Wash/dry hands;Oral care;Min guard;Standing Grooming Details (indicate cue type and reason): Pt stood at sink for 4 minutes to groom with min guard.  Pt fatigued at end of 4 min and needed to sit before returning to chair. Upper Body Bathing: Set up;Sitting;Supervision/ safety   Lower Body Bathing: Minimal assistance;Sit to/from stand Lower Body Bathing Details (indicate cue type and reason): Pt only requird assist to wash back side in standing. Pt can now cross legs over to wash BLEs. Upper Body Dressing : Set up;Sitting   Lower Body Dressing: Minimal assistance;Sit to/from stand Lower Body Dressing Details (indicate cue type and reason): assist only needed for balance in standing.  Pt does not need AE an can reach BLEs by crossing legs over. Toilet Transfer: Minimal assistance;Grab bars;Ambulation Toilet Transfer Details (indicate cue type and reason): Pt walked to bathroom with min assist due to poor balance but transferred on and off the commode with min guard. Toileting- Clothing Manipulation and Hygiene: Minimal assistance;Sit to/from stand       Functional mobility  during ADLs: Minimal assistance;Rolling walker General ADL Comments: Pt fatigues quickly but was able to reach lower legs today.  Pt did keep O2 sats above 93% thorughout session.      Vision                 Additional Comments: Pt off of O2 and stats remained above 93% for entire session.   Perception     Praxis       Cognition   Behavior During Therapy: WFL for tasks assessed/performed Overall Cognitive Status: Within Functional Limits for tasks assessed                       Extremity/Trunk Assessment               Exercises     Shoulder Instructions       General Comments      Pertinent Vitals/ Pain       Pain Assessment: Faces Faces Pain Scale: Hurts little more Pain Location: LLE Pain Descriptors / Indicators: Grimacing Pain Intervention(s): Limited activity within patient's tolerance;Premedicated before session;Monitored during session;Repositioned  Home Living                                          Prior Functioning/Environment              Frequency Min 2X/week     Progress Toward Goals  OT Goals(current goals can now be found in the care plan section)  Progress towards OT goals: Progressing toward goals  Acute Rehab OT Goals Patient Stated Goal: go home OT Goal Formulation: With patient Time For Goal Achievement: 09/20/15 Potential to Achieve Goals: Good ADL Goals Pt Will Perform Grooming: with supervision;standing Pt Will Perform Lower Body Bathing: with supervision;with adaptive equipment;sit to/from stand Pt Will Perform Lower Body Dressing: with supervision;with adaptive equipment;sit to/from stand Pt Will Transfer to Toilet: with supervision;ambulating;bedside commode Pt Will Perform Tub/Shower Transfer: Tub transfer;with min guard assist;ambulating;shower seat;rolling walker  Plan Discharge plan remains appropriate    Co-evaluation                 End of Session     Activity Tolerance Patient tolerated treatment well   Patient Left in chair;with call bell/phone within reach;with family/visitor present   Nurse Communication Mobility status;Other (comment) (pt now agreeable to HHOT/PT)        Time: 6387-5643 OT Time Calculation (min): 35 min  Charges: OT General Charges $OT Visit: 1 Procedure OT  Treatments $Self Care/Home Management : 23-37 mins  Hope Budds 09/07/2015, 9:47 AM  [phone]

## 2015-09-07 NOTE — Progress Notes (Signed)
   VASCULAR SURGERY ASSESSMENT & PLAN:  2 Days Post-Op s/p: Extensive iliofemoral endarterectomy and profundoplasty with vein patch angioplasty.  Dorsalis pedis and peroneal signal with the Doppler.  Home today.  She will follow up with Dr. Nanetta Batty next week.  SUBJECTIVE: No complaints. Ambulated yesterday.  PHYSICAL EXAM: Vitals:   09/06/15 1204 09/06/15 1938 09/07/15 0430 09/07/15 0859  BP: (!) 117/49 122/65 (!) 144/59 (!) 115/49  Pulse: 81 87 93   Resp: 16 20 20    Temp: 98.4 F (36.9 C) 98.5 F (36.9 C) 98.5 F (36.9 C)   TempSrc: Oral Oral Oral   SpO2: 99% 95% 100%   Weight:      Height:       Incisions look fine. Dorsalis pedis and renal signal with the Doppler.  LABS: Lab Results  Component Value Date   WBC 10.6 (H) 09/06/2015   HGB 9.9 (L) 09/06/2015   HCT 30.1 (L) 09/06/2015   MCV 88.3 09/06/2015   PLT 214 09/06/2015   Lab Results  Component Value Date   CREATININE 0.57 09/06/2015   Lab Results  Component Value Date   INR 1.13 09/05/2015   CBG (last 3)   Recent Labs  09/06/15 1645 09/06/15 2105 09/07/15 0640  GLUCAP 191* 243* 108*    Active Problems:   Critical lower limb ischemia    Cari Caraway Beeper: [phone] 09/07/2015

## 2015-09-07 NOTE — Care Management Note (Signed)
Case Management Note Previous CM note initiated by Letha Cape RN, CM  Patient Details  Name: [name] [name] MRN: [id number] Date of Birth: 04/07/1950  Subjective/Objective:    Patient is s/p left common femoral artery endarterectomy, Previously was set up with [facility] but declined their services.  Patient states she lives with her daughter who works two jobs but can help her if needed.  She has Medicare as her primary insurance, she has a PCP and no problem with getting medications, she has transportation at discharge. Await pt eval.                  Action/Plan: 09/06/15- Zannie Runkle- Pt tx from 3S to 2W- CM to continue to follow- per PT/OT evals recommendations for [facility]- will need orders for discharge-   Expected Discharge Date:   09/07/15               Expected Discharge Plan:  Home w Home Health Services  In-House Referral:     Discharge planning Services  CM Consult  Post Acute Care Choice:  Home Health Choice offered to:  Patient  DME Arranged:  N/A DME Agency:  NA  HH Arranged:  PT, OT HH Agency:  [facility]  Status of Service:  Completed, signed off  If discussed at Long Length of Stay Meetings, dates discussed:    Additional Comments:  09/07/15- 1145- Parker Sawatzky RN, CM- pt ready for d/c home today- per recommendations by PT/OT-orders have been placed - spoke with pt at bedside- who is agreeable to services- choice offered for [facility] agencies in [place]- per pt she would like to use [facility] for [facility] services- referral called to Tiffany with [facility] for HHPT/OT- per pt she has needed DME- no further CM needs noted.    Zenda Alpers [place], RN 09/07/2015, 11:46 AM [phone]

## 2015-09-07 NOTE — Discharge Summary (Signed)
Discharge Summary     [name] [name] [birth year] 65 y.o. female  [id number]  Admission Date: 09/05/2015  Discharge Date: 09/07/15  Physician: Chuck Hint, MD  Admission Diagnosis: Peripheral vascular disease with left fifth toe ulcer I70.245   HPI:   This is a 65 y.o. female who underwent arteriography today by Dr. Nanetta Batty. She gives a long history of bilateral lower extremity claudication which has been more significant on the left side. She experiences pain in both calves and thighs and hips which is brought on by ambulation and relieved with rest. This occurs at a very short distance. She developed rest pain in her left foot approximately 3-4 weeks ago.She also states that she's had a wound on the lateral aspect of her left fifth toe for about 2 weeks.  She underwent an arteriogram today which showed multilevel arterial occlusive disease bilaterally. Given the limb threatening situation of the left lower extremity vascular surgery was consulted.  Her risk factors for peripheral vascular disease include diabetes, hypertension, hyperlipidemia, and history of tobacco use in the past.  She had a myocardial infarction in 2006. She had a PTCA at that time and has had no problems since then. She is followed by Dr. Norwood Levo and Dr. Nanetta Batty. She denies any recent chest pain or chest pressure.  Hospital Course:  The patient was admitted to the hospital and taken to the operating room on 09/05/2015 and underwent: 1. Left common femoral artery endarterectomy and profundoplasty with vein patch angioplasty of left common femoral artery and deep femoral artery using left greater saphenous vein 2. Intraoperative arteriogram    The pt tolerated the procedure well and was transported to the PACU in good condition.   That afternoon, she had a doppler signal in the left peroneal and minimal output from JP drain.  By POD 1, she did have acute blood loss anemia and  tolerated this well.  She was transferred to the telemetry floor this day.  Her JP drain is removed.  She was seen by PT/OT who have recommended HHPT/OT, however, the pt has refused this.  On POD 3, she had mild fever and will be discharged home on 7 days of Cipro.   The remainder of the hospital course consisted of increasing mobilization and increasing intake of solids without difficulty.  CBC    Component Value Date/Time   WBC 10.6 (H) 09/06/2015 0149   RBC 3.41 (L) 09/06/2015 0149   HGB 9.9 (L) 09/06/2015 0149   HCT 30.1 (L) 09/06/2015 0149   PLT 214 09/06/2015 0149   MCV 88.3 09/06/2015 0149   MCH 29.0 09/06/2015 0149   MCHC 32.9 09/06/2015 0149   RDW 13.1 09/06/2015 0149   LYMPHSABS 2,142 08/24/2015 1504   MONOABS 714 08/24/2015 1504   EOSABS 306 08/24/2015 1504   BASOSABS 0 08/24/2015 1504    BMET    Component Value Date/Time   NA 138 09/06/2015 0149   K 4.0 09/06/2015 0149   CL 108 09/06/2015 0149   CO2 26 09/06/2015 0149   GLUCOSE 123 (H) 09/06/2015 0149   BUN 8 09/06/2015 0149   CREATININE 0.57 09/06/2015 0149   CREATININE 0.61 08/24/2015 1504   CALCIUM 8.4 (L) 09/06/2015 0149   GFRNONAA >60 09/06/2015 0149   GFRAA >60 09/06/2015 0149     Discharge Instructions    Call MD for:  redness, tenderness, or signs of infection (pain, swelling, bleeding, redness, odor or green/yellow discharge around incision site)  Complete by:  As directed   Call MD for:  severe or increased pain, loss or decreased feeling  in affected limb(s)    Complete by:  As directed   Call MD for:  temperature >100.5    Complete by:  As directed   Discharge wound care:    Complete by:  As directed   Wash the groin wound with soap and water daily and pat dry. (No tub bath-only shower)  Then put a dry gauze or washcloth there to keep this area dry daily and as needed.  Do not use Vaseline or neosporin on your incisions.  Only use soap and water on your incisions and then protect and keep dry.     Driving Restrictions    Complete by:  As directed   No driving for 2 weeks   Lifting restrictions    Complete by:  As directed   No lifting for 4 weeks   Resume previous diet    Complete by:  As directed      Discharge Diagnosis:  Peripheral vascular disease with left fifth toe ulcer I70.245  Secondary Diagnosis: Patient Active Problem List   Diagnosis Date Noted  . Critical lower limb ischemia 08/28/2015  . Hyperlipidemia with target LDL less than 70 06/28/2013  . Diabetes mellitus type II, uncontrolled (HCC) 03/03/2013  . Complicated migraine 03/03/2013  . Stroke-like symptom 03/02/2013  . Diabetes mellitus (HCC) 03/02/2013  . Aphasia 03/02/2013  . Headache(784.0) 03/02/2013  . PAD (peripheral artery disease) (HCC) 06/05/2011  . CAD (coronary artery disease): DES to dominant Circ 10/13/09 06/05/2011  . Hypotension 06/05/2011  . Claudication in peripheral vascular disease:  Lifestyle limiting. 06/05/2011  . Hx of tobacco use, presenting hazards to health 06/05/2011   Past Medical History:  Diagnosis Date  . Arthritis    "feel like I have it all over" (08/28/2015)  . Complication of anesthesia    DIFFICULT WAKING "only when I was smoking; no problems since I qui5"  . Coronary artery disease   . Family history of adverse reaction to anesthesia    sister slow to wake up  . GERD (gastroesophageal reflux disease)    takes Protonix daily   . Hip bursitis   . Hyperlipidemia LDL goal < 70 06/28/2013   takes Atorvastatin daily  . Hypertension    takes Metoprolol and Imdur daily  . Migraine    "none in a long time" (08/28/2015)  . Myocardial infarction (HCC) 2011  . Neuromuscular disorder (HCC)    DIABETIC NEUROPATHY  . PAD (peripheral artery disease) (HCC)   . Peripheral vascular disease (HCC)   . Type II diabetes mellitus (HCC)    takes Lantus nightly.Average fasting blood sugar runs 80-90       Medication List    TAKE these medications   amLODipine 5 MG  tablet Commonly known as:  NORVASC TAKE 1 TABLET(5 MG) BY MOUTH DAILY   aspirin EC 81 MG tablet Take 81 mg by mouth daily.   atorvastatin 80 MG tablet Commonly known as:  LIPITOR Take 1 tablet (80 mg total) by mouth daily.   ciprofloxacin 500 MG tablet Commonly known as:  CIPRO Take 1 tablet (500 mg total) by mouth 2 (two) times daily.   clopidogrel 75 MG tablet Commonly known as:  PLAVIX Take 1 tablet (75 mg total) by mouth daily. Holding for vascular surgery, restart as soon as possible after leg surgery (after 8/8)   gabapentin 100 MG capsule Commonly known as:  NEURONTIN Take 300 mg by mouth 2 (two) times daily.   Insulin Glargine 100 UNIT/ML Solostar Pen Commonly known as:  LANTUS Inject 35 Units into the skin daily at 10 pm.   isosorbide mononitrate 60 MG 24 hr tablet Commonly known as:  IMDUR Take 1.5 tablets by mouth once daily. <please make appointment> What changed:  how much to take  how to take this  when to take this  additional instructions   losartan 100 MG tablet Commonly known as:  COZAAR TAKE 1 TABLET BY MOUTH EVERY DAY   metFORMIN 1000 MG tablet Commonly known as:  GLUCOPHAGE Take 1,000 mg by mouth 2 (two) times daily with a meal.   metoprolol succinate 50 MG 24 hr tablet Commonly known as:  TOPROL-XL TAKE 1 TABLET(50 MG) BY MOUTH DAILY   oxyCODONE-acetaminophen 5-325 MG tablet Commonly known as:  PERCOCET/ROXICET Take 1-2 tablets by mouth every 6 (six) hours as needed for moderate pain. What changed:  how much to take  when to take this   pantoprazole 40 MG tablet Commonly known as:  PROTONIX Take 1 tablet (40 mg total) by mouth daily.       Prescriptions given: 1.  Percocet #20 No Refill  Instructions: 1.  Wash the groin wound with soap and water daily and pat dry. (No tub bath-only shower)  Then put a dry gauze or washcloth there to keep this area dry daily and as needed.  Do not use Vaseline or neosporin on your incisions.   Only use soap and water on your incisions and then protect and keep dry.  Disposition: home  Patient's condition: is Good  Follow up: 1. Dr. Edilia Bo in 2 weeks   Doreatha Massed, PA-C [facility] [phone] 09/07/2015  7:31 AM  - For VQI Registry use ---  Post-op:  Wound infection: No  Graft infection: No  Transfusion: Yes  If yes, 1 units given New Arrhythmia: No Ipsilateral amputation: No,  Minor,  BKA,  AKA Discharge patency: [x ] Primary,  Primary assisted,  Secondary,  Occluded Patency judged by: [x ] Dopper only,  Palpable graft pulse,  Palpable distal pulse,  ABI inc. > 0.15,  Duplex Discharge ABI: R not done, L  D/C Ambulatory Status: Ambulatory  Complications: MI: No,  Troponin only,  EKG or Clinical CHF: No Resp failure:No,  Pneumonia,  Ventilator Chg in renal function: No,  Inc. Cr > 0.5,  Temp. Dialysis,  Permanent dialysis Stroke: No,  Minor,  Major Return to OR: No  Reason for return to OR:  Bleeding,  Infection,  Thrombosis,  Revision  Discharge medications: Statin use:  yes ASA use:  yes Plavix use:  yes Beta blocker use: yes ACEI use:   no ARB use:  yes Coumadin use: no

## 2015-09-09 ENCOUNTER — Other Ambulatory Visit: Payer: Self-pay | Admitting: Cardiovascular Disease

## 2015-09-09 LAB — TYPE AND SCREEN
ABO/RH(D): A POS
ANTIBODY SCREEN: NEGATIVE
Unit division: 0
Unit division: 0

## 2015-09-11 ENCOUNTER — Telehealth: Payer: Self-pay | Admitting: Cardiovascular Disease

## 2015-09-11 DIAGNOSIS — Z794 Long term (current) use of insulin: Secondary | ICD-10-CM | POA: Diagnosis not present

## 2015-09-11 DIAGNOSIS — Z792 Long term (current) use of antibiotics: Secondary | ICD-10-CM | POA: Diagnosis not present

## 2015-09-11 DIAGNOSIS — E1151 Type 2 diabetes mellitus with diabetic peripheral angiopathy without gangrene: Secondary | ICD-10-CM | POA: Diagnosis not present

## 2015-09-11 DIAGNOSIS — M1991 Primary osteoarthritis, unspecified site: Secondary | ICD-10-CM | POA: Diagnosis not present

## 2015-09-11 DIAGNOSIS — I10 Essential (primary) hypertension: Secondary | ICD-10-CM | POA: Diagnosis not present

## 2015-09-11 DIAGNOSIS — G43909 Migraine, unspecified, not intractable, without status migrainosus: Secondary | ICD-10-CM | POA: Diagnosis not present

## 2015-09-11 DIAGNOSIS — Z7984 Long term (current) use of oral hypoglycemic drugs: Secondary | ICD-10-CM | POA: Diagnosis not present

## 2015-09-11 DIAGNOSIS — I2510 Atherosclerotic heart disease of native coronary artery without angina pectoris: Secondary | ICD-10-CM | POA: Diagnosis not present

## 2015-09-11 DIAGNOSIS — E11621 Type 2 diabetes mellitus with foot ulcer: Secondary | ICD-10-CM | POA: Diagnosis not present

## 2015-09-11 DIAGNOSIS — Z7982 Long term (current) use of aspirin: Secondary | ICD-10-CM | POA: Diagnosis not present

## 2015-09-11 DIAGNOSIS — E1140 Type 2 diabetes mellitus with diabetic neuropathy, unspecified: Secondary | ICD-10-CM | POA: Diagnosis not present

## 2015-09-11 DIAGNOSIS — Z48812 Encounter for surgical aftercare following surgery on the circulatory system: Secondary | ICD-10-CM | POA: Diagnosis not present

## 2015-09-11 DIAGNOSIS — I252 Old myocardial infarction: Secondary | ICD-10-CM | POA: Diagnosis not present

## 2015-09-11 DIAGNOSIS — L97521 Non-pressure chronic ulcer of other part of left foot limited to breakdown of skin: Secondary | ICD-10-CM | POA: Diagnosis not present

## 2015-09-11 DIAGNOSIS — Z7902 Long term (current) use of antithrombotics/antiplatelets: Secondary | ICD-10-CM | POA: Diagnosis not present

## 2015-09-11 NOTE — Telephone Encounter (Signed)
New message        Calling to get orders for skilled nursing to eval and treat.  Pt has an appt scheduled for tomorrow.  Please call and give verbal

## 2015-09-11 NOTE — Telephone Encounter (Signed)
Will forward to jennifer so she is aware at appt tomorrow

## 2015-09-12 ENCOUNTER — Encounter: Payer: Self-pay | Admitting: Cardiovascular Disease

## 2015-09-12 ENCOUNTER — Ambulatory Visit (INDEPENDENT_AMBULATORY_CARE_PROVIDER_SITE_OTHER): Payer: Medicare Other | Admitting: Cardiovascular Disease

## 2015-09-12 ENCOUNTER — Ambulatory Visit (HOSPITAL_COMMUNITY)
Admit: 2015-09-12 | Discharge: 2015-09-12 | Disposition: A | Discharge status: Home / Self Care | Payer: Medicare Other | Source: Ambulatory Visit | Attending: Urology | Admitting: Urology

## 2015-09-12 ENCOUNTER — Other Ambulatory Visit: Payer: Self-pay | Admitting: *Deleted

## 2015-09-12 ENCOUNTER — Other Ambulatory Visit: Payer: Self-pay | Admitting: Cardiovascular Disease

## 2015-09-12 VITALS — BP 155/76 | HR 97 | Ht 64.0 in | Wt 164.0 lb

## 2015-09-12 DIAGNOSIS — Z95820 Peripheral vascular angioplasty status with implants and grafts: Secondary | ICD-10-CM | POA: Insufficient documentation

## 2015-09-12 DIAGNOSIS — I70203 Unspecified atherosclerosis of native arteries of extremities, bilateral legs: Secondary | ICD-10-CM | POA: Diagnosis not present

## 2015-09-12 DIAGNOSIS — I2510 Atherosclerotic heart disease of native coronary artery without angina pectoris: Secondary | ICD-10-CM | POA: Insufficient documentation

## 2015-09-12 DIAGNOSIS — I70229 Atherosclerosis of native arteries of extremities with rest pain, unspecified extremity: Secondary | ICD-10-CM

## 2015-09-12 DIAGNOSIS — E785 Hyperlipidemia, unspecified: Secondary | ICD-10-CM | POA: Insufficient documentation

## 2015-09-12 DIAGNOSIS — E119 Type 2 diabetes mellitus without complications: Secondary | ICD-10-CM | POA: Diagnosis not present

## 2015-09-12 DIAGNOSIS — Z87891 Personal history of nicotine dependence: Secondary | ICD-10-CM | POA: Insufficient documentation

## 2015-09-12 DIAGNOSIS — I779 Disorder of arteries and arterioles, unspecified: Secondary | ICD-10-CM | POA: Diagnosis not present

## 2015-09-12 DIAGNOSIS — Z79899 Other long term (current) drug therapy: Secondary | ICD-10-CM | POA: Diagnosis not present

## 2015-09-12 DIAGNOSIS — I739 Peripheral vascular disease, unspecified: Secondary | ICD-10-CM

## 2015-09-12 DIAGNOSIS — I998 Other disorder of circulatory system: Secondary | ICD-10-CM | POA: Diagnosis not present

## 2015-09-12 NOTE — Assessment & Plan Note (Signed)
[name] returns today for follow-up. She had peripheral intervention performed by myself 08/28/15 with diamondback orbital rotational atherectomy, PTCA and stenting of a highly calcified high-grade distal left common iliac artery stenosis. She did have calcified left common femoral artery  and profunda disease as well as a total left SFA with 1 vessel runoff via the peroneal. She ultimately underwent staged left common femoral artery endarterectomy and profundoplasty with patch of the left common femoral artery by Dr. Edilia Bo 09/05/15. She has critical limb ischemia with a nonhealing ulcer on the lateral aspect of her left foot.

## 2015-09-12 NOTE — Patient Instructions (Addendum)
Medication Instructions:  Your physician recommends that you continue on your current medications as directed. Please refer to the Current Medication list given to you today.   Testing/Procedures: Dr. Allyson Sabal has ordered a peripheral angiogram to be done at [facility].  This procedure is going to look at the bloodflow in your lower extremities.  If Dr. Allyson Sabal is able to open up the arteries, you will have to spend one night in the hospital.  If he is not able to open the arteries, you will be able to go home that same day.  SCHEDULE 8/28 OR 8/31  After the procedure, you will not be allowed to drive for 3 days or push, pull, or lift anything greater than 10 lbs for one week.    You will be required to have the following tests prior to the procedure:  1. Blood work-the blood work can be done no more than 14 days prior to the procedure.  It can be done at any [facility] lab.  There is one downstairs on the first floor of this building and one in the [facility] building [phone] [address], [address])   *REPS NONE PER DR BERRY  Puncture site  LEFT BRACHIAL  Follow-Up: You have been referred to WOUND CARE CLINIC WITH DR Meyer Russel.   If you need a refill on your cardiac medications before your next appointment, please call your pharmacy.

## 2015-09-12 NOTE — Progress Notes (Signed)
09/12/2015 [name] [name]   11/10/1950  [id number]  Primary Physician Georgann HousekeeperHUSAIN,KARRAR, MD Primary Cardiologist: Runell GessJonathan J Tlaloc Taddei MD Roseanne RenoFACP, FACC, FAHA, FSCAI  HPI:  [name] [name] is a 65 year old Caucasian female patient of Dr. Landry DykeKelly's with a history of CAD and PAD. I last saw her  in the office 08/18/15. She has had right common and external iliac artery stenting as well as diamondback atherectomy of her right SFA, PTA and stenting of that vessel as well with known occluded left SFA. She's had redilatation of her right SFA for "in-stent restenosis. I re-angiogramed 06/04/11 Demonstrating a patent right common iliac artery stent, an occluded right SFA stent, 60% proximal left external iliac artery stenosis with a 23 mm gradient, 80% left common femoral artery stenosis. She has severe lifestyle including claudication as well as rest pain on the left with recent Dopplers performed 10/26/14 revealing a right ABI 0.49 and a left upper and 25. We decide to proceed with angiography and potential intervention of her left external iliac artery. She may ultimately require left common femoral endarterectomy and patch angioplasty plus or minus femoropopliteal bypass grafting.her other problems include history of CAD status post circumflex stenting and at the time of a myocardial infarction. She has a history of hypertension, diabetes and hyperlipidemia as well. She denies chest pain or shortness of breath. She has severe lifestyle limiting claudication, and now has rest pain with a nonhealing ulcer on her left foot. Her left lower extremity arterial Doppler studies performed 10/26/14 showed a right ABI 0.49 and a left of 0.25. Her SFAs were occluded and she had high-grade common femoral disease bilaterally. I angiogramed her 70/31/17 and performed diamondback orbital rotational atherectomy, PTCA and stenting of a high-grade calcified distal left common iliac artery stenosis. I did demonstrate high-grade calcified left common  femoral and profunda stenosis as well as a total left SFA with reconstitution in the popliteal artery and one vessel runoff via disease peroneal. Dr. Cari Carawayhris Dickson subsequently performed staged left common femoral endarterectomy with profundoplasty on 09/05/15. He had difficulty clamping the artery above and was unable to pass a wire and balloon for hemostasis. Since the procedure she says that her pain in her foot has somewhat improved and the ulcer seems to be healing slowly. I am going to refer her to the wound care center for further outpatient treatment. Dr. Edilia Boickson has asked me to perform angiography and potential intervention above his endarterectomy site via the left brachial approach.    Current Outpatient Prescriptions  Medication Sig Dispense Refill  . amLODipine (NORVASC) 5 MG tablet TAKE 1 TABLET(5 MG) BY MOUTH DAILY 30 tablet 11  . aspirin EC 81 MG tablet Take 81 mg by mouth daily.    Marland Kitchen. atorvastatin (LIPITOR) 80 MG tablet Take 1 tablet (80 mg total) by mouth daily. 90 tablet 1  . ciprofloxacin (CIPRO) 500 MG tablet Take 1 tablet (500 mg total) by mouth 2 (two) times daily. 14 tablet 0  . clopidogrel (PLAVIX) 75 MG tablet Take 1 tablet (75 mg total) by mouth daily. Holding for vascular surgery, restart as soon as possible after leg surgery (after 8/8) 90 tablet 3  . gabapentin (NEURONTIN) 100 MG capsule Take 300 mg by mouth 2 (two) times daily.   3  . Insulin Glargine (LANTUS) 100 UNIT/ML Solostar Pen Inject 35 Units into the skin daily at 10 pm.     . isosorbide mononitrate (IMDUR) 60 MG 24 hr tablet Take 60 mg by mouth daily.  Pt takes 1.5 tablets daily by mouth    . losartan (COZAAR) 100 MG tablet TAKE 1 TABLET BY MOUTH EVERY DAY 30 tablet 11  . metFORMIN (GLUCOPHAGE) 1000 MG tablet Take 1,000 mg by mouth 2 (two) times daily with a meal.    . metoprolol succinate (TOPROL-XL) 50 MG 24 hr tablet Take 1 tablet (50 mg total) by mouth daily. 30 tablet 2  . ondansetron (ZOFRAN ODT) 8 MG  disintegrating tablet Take 1 tablet (8 mg total) by mouth every 8 (eight) hours as needed for nausea or vomiting. 20 tablet 0  . oxyCODONE-acetaminophen (PERCOCET/ROXICET) 5-325 MG tablet Take 1-2 tablets by mouth every 6 (six) hours as needed for moderate pain. 20 tablet 0  . pantoprazole (PROTONIX) 40 MG tablet Take 1 tablet (40 mg total) by mouth daily. 30 tablet 10   No current facility-administered medications for this visit.     Allergies  Allergen Reactions  . Hydrochlorothiazide   . Latex Rash  . Penicillins Swelling and Rash    Has patient had a PCN reaction causing immediate rash, facial/tongue/throat swelling, SOB or lightheadedness with hypotension: Yes Has patient had a PCN reaction causing severe rash involving mucus membranes or skin necrosis: No Has patient had a PCN reaction that required hospitalization No Has patient had a PCN reaction occurring within the last 10 years: No If all of the above answers are "NO", then may proceed with Cephalosporin use.     Social History   Social History  . Marital status: Married    Spouse name: N/A  . Number of children: N/A  . Years of education: N/A   Occupational History  . Not on file.   Social History Main Topics  . Smoking status: Former Smoker    Packs/day: 1.50    Years: 41.00    Types: Cigarettes    Quit date: 10/12/2009  . Smokeless tobacco: Never Used  . Alcohol use No  . Drug use: No  . Sexual activity: Not Currently    Birth control/ protection: Surgical   Other Topics Concern  . Not on file   Social History Narrative  . No narrative on file     Review of Systems: General: negative for chills, fever, night sweats or weight changes.  Cardiovascular: negative for chest pain, dyspnea on exertion, edema, orthopnea, palpitations, paroxysmal nocturnal dyspnea or shortness of breath Dermatological: negative for rash Respiratory: negative for cough or wheezing Urologic: negative for hematuria Abdominal:  negative for nausea, vomiting, diarrhea, bright red blood per rectum, melena, or hematemesis Neurologic: negative for visual changes, syncope, or dizziness All other systems reviewed and are otherwise negative except as noted above.    Blood pressure (!) 155/76, pulse 97, height 5\' 4"  (1.626 m), weight 164 lb (74.4 kg).  General appearance: alert and no distress Neck: no adenopathy, no JVD, supple, symmetrical, trachea midline, thyroid not enlarged, symmetric, no tenderness/mass/nodules and Soft bilateral carotid bruits Lungs: clear to auscultation bilaterally Heart: regular rate and rhythm, S1, S2 normal, no murmur, click, rub or gallop Extremities: extremities normal, atraumatic, no cyanosis or edema and Nonhealing ulcer lateral aspect of left foot  EKG not performed today  ASSESSMENT AND PLAN:   PAD (peripheral artery disease) (HCC) [name] [name] returns today for follow-up. She had peripheral intervention performed by myself 08/28/15 with diamondback orbital rotational atherectomy, PTCA and stenting of a highly calcified high-grade distal left common iliac artery stenosis. She did have calcified left common femoral artery  and profunda disease as  well as a total left SFA with 1 vessel runoff via the peroneal. She ultimately underwent staged left common femoral artery endarterectomy and profundoplasty with patch of the left common femoral artery by Dr. Edilia Bo 09/05/15. She has critical limb ischemia with a nonhealing ulcer on the lateral aspect of her left foot.      Runell Gess MD FACP,FACC,FAHA, [facility] 09/12/2015 10:02 AM

## 2015-09-13 ENCOUNTER — Ambulatory Visit: Payer: Commercial Managed Care - HMO | Admitting: Cardiovascular Disease

## 2015-09-13 ENCOUNTER — Encounter (HOSPITAL_BASED_OUTPATIENT_CLINIC_OR_DEPARTMENT_OTHER): Payer: Medicare Other | Attending: Surgery

## 2015-09-13 DIAGNOSIS — E1151 Type 2 diabetes mellitus with diabetic peripheral angiopathy without gangrene: Secondary | ICD-10-CM | POA: Diagnosis not present

## 2015-09-13 DIAGNOSIS — E11621 Type 2 diabetes mellitus with foot ulcer: Secondary | ICD-10-CM | POA: Insufficient documentation

## 2015-09-13 DIAGNOSIS — I10 Essential (primary) hypertension: Secondary | ICD-10-CM | POA: Insufficient documentation

## 2015-09-13 DIAGNOSIS — Z794 Long term (current) use of insulin: Secondary | ICD-10-CM | POA: Insufficient documentation

## 2015-09-13 DIAGNOSIS — M1990 Unspecified osteoarthritis, unspecified site: Secondary | ICD-10-CM | POA: Insufficient documentation

## 2015-09-13 DIAGNOSIS — Z48812 Encounter for surgical aftercare following surgery on the circulatory system: Secondary | ICD-10-CM | POA: Diagnosis not present

## 2015-09-13 DIAGNOSIS — Z87891 Personal history of nicotine dependence: Secondary | ICD-10-CM | POA: Insufficient documentation

## 2015-09-13 DIAGNOSIS — Z955 Presence of coronary angioplasty implant and graft: Secondary | ICD-10-CM | POA: Diagnosis not present

## 2015-09-13 DIAGNOSIS — E1140 Type 2 diabetes mellitus with diabetic neuropathy, unspecified: Secondary | ICD-10-CM | POA: Diagnosis not present

## 2015-09-13 DIAGNOSIS — L97521 Non-pressure chronic ulcer of other part of left foot limited to breakdown of skin: Secondary | ICD-10-CM | POA: Diagnosis not present

## 2015-09-13 DIAGNOSIS — I2510 Atherosclerotic heart disease of native coronary artery without angina pectoris: Secondary | ICD-10-CM | POA: Insufficient documentation

## 2015-09-13 DIAGNOSIS — I252 Old myocardial infarction: Secondary | ICD-10-CM | POA: Diagnosis not present

## 2015-09-13 NOTE — Telephone Encounter (Signed)
OK to give verbal order for PT.

## 2015-09-13 NOTE — Telephone Encounter (Signed)
Yes

## 2015-09-13 NOTE — Telephone Encounter (Signed)
New Message  [facility] expressed she evaluated pt Monday and needs verbal order for physical therapy.  Amber also expressed she's requesting for nurse to come out due to wound on foot.  Please follow up with Amber. Thanks!

## 2015-09-13 NOTE — Telephone Encounter (Signed)
Called and left message with Amber that Dr Allyson Sabal said ok to wound care at home and to call back if she has any questions.

## 2015-09-13 NOTE — Telephone Encounter (Signed)
Routing to Dr Berry for advice. 

## 2015-09-13 NOTE — Telephone Encounter (Signed)
Returned call to [facility] and gave her verbal order for PT from Dr Allyson Sabal.  She then asked if skilled nursing could eval and treat patient at home for wound with following orders and guidelines as determined by the Wound Care Center with Dr Meyer Russel. This is from patient request because it is hard for the patient to travel back and forth to appointments.   Will route to Dr Allyson Sabal for approval of order for home wound care.

## 2015-09-14 DIAGNOSIS — Z48812 Encounter for surgical aftercare following surgery on the circulatory system: Secondary | ICD-10-CM | POA: Diagnosis not present

## 2015-09-14 DIAGNOSIS — L97521 Non-pressure chronic ulcer of other part of left foot limited to breakdown of skin: Secondary | ICD-10-CM | POA: Diagnosis not present

## 2015-09-14 DIAGNOSIS — I2510 Atherosclerotic heart disease of native coronary artery without angina pectoris: Secondary | ICD-10-CM | POA: Diagnosis not present

## 2015-09-14 DIAGNOSIS — E11621 Type 2 diabetes mellitus with foot ulcer: Secondary | ICD-10-CM | POA: Diagnosis not present

## 2015-09-14 DIAGNOSIS — E1151 Type 2 diabetes mellitus with diabetic peripheral angiopathy without gangrene: Secondary | ICD-10-CM | POA: Diagnosis not present

## 2015-09-14 DIAGNOSIS — I10 Essential (primary) hypertension: Secondary | ICD-10-CM | POA: Diagnosis not present

## 2015-09-15 ENCOUNTER — Other Ambulatory Visit: Payer: Self-pay | Admitting: *Deleted

## 2015-09-15 DIAGNOSIS — I70229 Atherosclerosis of native arteries of extremities with rest pain, unspecified extremity: Secondary | ICD-10-CM

## 2015-09-15 DIAGNOSIS — I998 Other disorder of circulatory system: Secondary | ICD-10-CM

## 2015-09-18 ENCOUNTER — Encounter: Payer: Self-pay | Admitting: Vascular Surgery

## 2015-09-19 ENCOUNTER — Other Ambulatory Visit (HOSPITAL_BASED_OUTPATIENT_CLINIC_OR_DEPARTMENT_OTHER): Payer: Self-pay | Admitting: General Surgery

## 2015-09-19 ENCOUNTER — Ambulatory Visit (HOSPITAL_COMMUNITY)
Admission: RE | Admit: 2015-09-19 | Discharge: 2015-09-19 | Disposition: A | Discharge status: Home / Self Care | Payer: Medicare Other | Source: Ambulatory Visit | Attending: General Surgery | Admitting: General Surgery

## 2015-09-19 ENCOUNTER — Ambulatory Visit: Payer: Medicare Other | Admitting: Cardiovascular Disease

## 2015-09-19 DIAGNOSIS — L97829 Non-pressure chronic ulcer of other part of left lower leg with unspecified severity: Secondary | ICD-10-CM | POA: Insufficient documentation

## 2015-09-19 DIAGNOSIS — E11621 Type 2 diabetes mellitus with foot ulcer: Secondary | ICD-10-CM | POA: Insufficient documentation

## 2015-09-19 DIAGNOSIS — Z79899 Other long term (current) drug therapy: Secondary | ICD-10-CM | POA: Diagnosis not present

## 2015-09-19 DIAGNOSIS — Z48812 Encounter for surgical aftercare following surgery on the circulatory system: Secondary | ICD-10-CM | POA: Diagnosis not present

## 2015-09-19 DIAGNOSIS — I739 Peripheral vascular disease, unspecified: Secondary | ICD-10-CM | POA: Diagnosis not present

## 2015-09-19 DIAGNOSIS — E1151 Type 2 diabetes mellitus with diabetic peripheral angiopathy without gangrene: Secondary | ICD-10-CM | POA: Diagnosis not present

## 2015-09-19 DIAGNOSIS — I2510 Atherosclerotic heart disease of native coronary artery without angina pectoris: Secondary | ICD-10-CM | POA: Diagnosis not present

## 2015-09-19 DIAGNOSIS — L97529 Non-pressure chronic ulcer of other part of left foot with unspecified severity: Principal | ICD-10-CM

## 2015-09-19 DIAGNOSIS — I998 Other disorder of circulatory system: Secondary | ICD-10-CM | POA: Diagnosis not present

## 2015-09-19 DIAGNOSIS — M7989 Other specified soft tissue disorders: Secondary | ICD-10-CM | POA: Insufficient documentation

## 2015-09-19 DIAGNOSIS — I779 Disorder of arteries and arterioles, unspecified: Secondary | ICD-10-CM | POA: Diagnosis not present

## 2015-09-19 DIAGNOSIS — L97521 Non-pressure chronic ulcer of other part of left foot limited to breakdown of skin: Secondary | ICD-10-CM | POA: Diagnosis not present

## 2015-09-19 DIAGNOSIS — I10 Essential (primary) hypertension: Secondary | ICD-10-CM | POA: Diagnosis not present

## 2015-09-19 LAB — CBC WITH DIFFERENTIAL/PLATELET
BASOS PCT: 2 %
Basophils Absolute: 184 cells/uL (ref 0–200)
EOS PCT: 3 %
Eosinophils Absolute: 276 cells/uL (ref 15–500)
HEMATOCRIT: 33.7 % — AB (ref 35.0–45.0)
HEMOGLOBIN: 11.6 g/dL — AB (ref 11.7–15.5)
LYMPHS ABS: 2484 {cells}/uL (ref 850–3900)
Lymphocytes Relative: 27 %
MCH: 29.4 pg (ref 27.0–33.0)
MCHC: 34.4 g/dL (ref 32.0–36.0)
MCV: 85.3 fL (ref 80.0–100.0)
MONO ABS: 644 {cells}/uL (ref 200–950)
MPV: 8.5 fL (ref 7.5–12.5)
Monocytes Relative: 7 %
NEUTROS ABS: 5612 {cells}/uL (ref 1500–7800)
NEUTROS PCT: 61 %
Platelets: 417 10*3/uL — ABNORMAL HIGH (ref 140–400)
RBC: 3.95 MIL/uL (ref 3.80–5.10)
RDW: 13.1 % (ref 11.0–15.0)
WBC: 9.2 10*3/uL (ref 3.8–10.8)

## 2015-09-19 LAB — APTT: aPTT: 27 s (ref 22–34)

## 2015-09-19 LAB — PROTIME-INR
INR: 1.1
PROTHROMBIN TIME: 11.3 s (ref 9.0–11.5)

## 2015-09-20 ENCOUNTER — Encounter: Payer: Self-pay | Admitting: Vascular Surgery

## 2015-09-20 ENCOUNTER — Ambulatory Visit (INDEPENDENT_AMBULATORY_CARE_PROVIDER_SITE_OTHER): Payer: Medicare Other | Admitting: Vascular Surgery

## 2015-09-20 VITALS — BP 138/63 | HR 89 | Temp 97.8°F | Resp 16 | Ht 64.0 in | Wt 164.0 lb

## 2015-09-20 DIAGNOSIS — Z87891 Personal history of nicotine dependence: Secondary | ICD-10-CM | POA: Diagnosis not present

## 2015-09-20 DIAGNOSIS — Z48812 Encounter for surgical aftercare following surgery on the circulatory system: Secondary | ICD-10-CM | POA: Diagnosis not present

## 2015-09-20 DIAGNOSIS — E1151 Type 2 diabetes mellitus with diabetic peripheral angiopathy without gangrene: Secondary | ICD-10-CM | POA: Diagnosis not present

## 2015-09-20 DIAGNOSIS — E11621 Type 2 diabetes mellitus with foot ulcer: Secondary | ICD-10-CM | POA: Diagnosis not present

## 2015-09-20 DIAGNOSIS — I252 Old myocardial infarction: Secondary | ICD-10-CM | POA: Diagnosis not present

## 2015-09-20 DIAGNOSIS — I2510 Atherosclerotic heart disease of native coronary artery without angina pectoris: Secondary | ICD-10-CM | POA: Diagnosis not present

## 2015-09-20 DIAGNOSIS — L97522 Non-pressure chronic ulcer of other part of left foot with fat layer exposed: Secondary | ICD-10-CM | POA: Diagnosis not present

## 2015-09-20 DIAGNOSIS — E0851 Diabetes mellitus due to underlying condition with diabetic peripheral angiopathy without gangrene: Secondary | ICD-10-CM | POA: Diagnosis not present

## 2015-09-20 DIAGNOSIS — I739 Peripheral vascular disease, unspecified: Secondary | ICD-10-CM | POA: Diagnosis not present

## 2015-09-20 DIAGNOSIS — I10 Essential (primary) hypertension: Secondary | ICD-10-CM | POA: Diagnosis not present

## 2015-09-20 DIAGNOSIS — L97521 Non-pressure chronic ulcer of other part of left foot limited to breakdown of skin: Secondary | ICD-10-CM | POA: Diagnosis not present

## 2015-09-20 DIAGNOSIS — Z955 Presence of coronary angioplasty implant and graft: Secondary | ICD-10-CM | POA: Diagnosis not present

## 2015-09-20 DIAGNOSIS — I70245 Atherosclerosis of native arteries of left leg with ulceration of other part of foot: Secondary | ICD-10-CM | POA: Diagnosis not present

## 2015-09-20 LAB — BASIC METABOLIC PANEL
BUN: 12 mg/dL (ref 7–25)
CHLORIDE: 103 mmol/L (ref 98–110)
CO2: 26 mmol/L (ref 20–31)
Calcium: 9.2 mg/dL (ref 8.6–10.4)
Creat: 0.58 mg/dL (ref 0.50–0.99)
GLUCOSE: 192 mg/dL — AB (ref 65–99)
POTASSIUM: 5.5 mmol/L — AB (ref 3.5–5.3)
SODIUM: 138 mmol/L (ref 135–146)

## 2015-09-20 NOTE — Progress Notes (Signed)
Patient name: [name] [name] MRN: [id number] DOB: Aug 11, 1950 Sex: female  REASON FOR VISIT: Follow up after left common femoral artery endarterectomy and profundoplasty with vein patch angioplasty.  HPI: [name] [name] is a 65 y.o. female who is followed by Dr. Nanetta BattyJonathan Berry with a nonhealing wound of the left foot and critical limb ischemia. She underwent an arteriogram which showed diffuse multilevel arterial occlusive disease. On the left side she required atherectomy, angioplasty, and stenting of the left common iliac artery and external iliac artery. Below that it looked like the external iliac artery was patent with occlusion of the common femoral artery and reconstitution of the deep femoral artery. The superficial femoral artery was occluded. There was poor visualization of her tibial vessels.  On 09/05/2015 she underwent a left common femoral artery endarterectomy and profundoplasty with vein patch angioplasty of the left common femoral artery and deep femoral artery using left great saphenous vein. I was not able to get all the plaque from above. I was considering doing a left femoropopliteal bypass graft same time, however I was not happy with the inflow and therefore did not proceed. I discussed the case with Dr. Nanetta BattyJonathan Berry and the plan was to do a arteriogram via left brachial approach to evaluate the inflow before considering femoropopliteal bypass grafting. Also, obtaining better images distally would help plan her bypass. This arteriogram scheduled for 09/28/2015.  Current Outpatient Prescriptions  Medication Sig Dispense Refill  . amLODipine (NORVASC) 5 MG tablet TAKE 1 TABLET(5 MG) BY MOUTH DAILY 30 tablet 11  . aspirin EC 81 MG tablet Take 81 mg by mouth daily.    Marland Kitchen. atorvastatin (LIPITOR) 80 MG tablet Take 1 tablet (80 mg total) by mouth daily. 90 tablet 1  . ciprofloxacin (CIPRO) 500 MG tablet Take 1 tablet (500 mg total) by mouth 2 (two) times daily. 14 tablet 0  . clopidogrel  (PLAVIX) 75 MG tablet Take 1 tablet (75 mg total) by mouth daily. Holding for vascular surgery, restart as soon as possible after leg surgery (after 8/8) 90 tablet 3  . gabapentin (NEURONTIN) 100 MG capsule Take 300 mg by mouth 2 (two) times daily.   3  . Insulin Glargine (LANTUS) 100 UNIT/ML Solostar Pen Inject 35 Units into the skin daily at 10 pm.     . isosorbide mononitrate (IMDUR) 60 MG 24 hr tablet Take 60 mg by mouth daily. Pt takes 1.5 tablets daily by mouth    . losartan (COZAAR) 100 MG tablet TAKE 1 TABLET BY MOUTH EVERY DAY 30 tablet 11  . metFORMIN (GLUCOPHAGE) 1000 MG tablet Take 1,000 mg by mouth 2 (two) times daily with a meal.    . metoprolol succinate (TOPROL-XL) 50 MG 24 hr tablet Take 1 tablet (50 mg total) by mouth daily. 30 tablet 2  . oxyCODONE-acetaminophen (PERCOCET/ROXICET) 5-325 MG tablet Take 1-2 tablets by mouth every 6 (six) hours as needed for moderate pain. 20 tablet 0  . pantoprazole (PROTONIX) 40 MG tablet Take 1 tablet (40 mg total) by mouth daily. 30 tablet 10  . ondansetron (ZOFRAN ODT) 8 MG disintegrating tablet Take 1 tablet (8 mg total) by mouth every 8 (eight) hours as needed for nausea or vomiting. (Patient not taking: Reported on 09/20/2015) 20 tablet 0   No current facility-administered medications for this visit.     REVIEW OF SYSTEMS:  [X]  denotes positive finding, [ ]  denotes negative finding Cardiac  Comments:  Chest pain or chest pressure:    Shortness  of breath upon exertion:    Short of breath when lying flat:    Irregular heart rhythm:    Constitutional    Fever or chills:      PHYSICAL EXAM: Vitals:   09/20/15 1435  BP: 138/63  Pulse: 89  Resp: 16  Temp: 97.8 F (36.6 C)  TempSrc: Oral  SpO2: 95%  Weight: 164 lb (74.4 kg)  Height: 5\' 4"  (1.626 m)    GENERAL: The patient is a well-nourished female, in no acute distress. The vital signs are documented above. CARDIOVASCULAR: There is a regular rate and rhythm. PULMONARY: There  is good air exchange bilaterally without wheezing or rales. Her incisions in the left groin are healing nicely. She has a palpable left femoral pulse. She has a markedly dampened monophasic posterior tibial, anterior tibial, and peroneal signal on the left with the Doppler. There is a wound on the lateral aspect of her left foot without significant erythema or drainage.  MEDICAL ISSUES:  NONHEALING WOUND LEFT FOOT WITH MULTILEVEL ARTERIAL OCCLUSIVE DISEASE: The patient is scheduled for an arteriogram on 09/28/2015 to evaluate her inflow on the left. If this is adequate and she could be considered for a left femoral to below-knee popliteal artery bypass. Currently I do not think she has adequate circulation to heal the wound on the lateral aspect of her left foot. I'll make further recommendations pending results of her arteriogram which is to be performed by Dr. Nanetta Batty on 09/28/2015. She is also followed in the wound care center by Dr. Meyer Russel.  [facility] [phone]

## 2015-09-21 ENCOUNTER — Telehealth: Payer: Self-pay | Admitting: *Deleted

## 2015-09-21 NOTE — Telephone Encounter (Signed)
[facility] orders for date 09/13/15 and 09/11/15, signed by Dr Allyson Sabal and faxed to [phone].

## 2015-09-25 DIAGNOSIS — Z48812 Encounter for surgical aftercare following surgery on the circulatory system: Secondary | ICD-10-CM | POA: Diagnosis not present

## 2015-09-25 DIAGNOSIS — I2510 Atherosclerotic heart disease of native coronary artery without angina pectoris: Secondary | ICD-10-CM | POA: Diagnosis not present

## 2015-09-25 DIAGNOSIS — I10 Essential (primary) hypertension: Secondary | ICD-10-CM | POA: Diagnosis not present

## 2015-09-25 DIAGNOSIS — E1151 Type 2 diabetes mellitus with diabetic peripheral angiopathy without gangrene: Secondary | ICD-10-CM | POA: Diagnosis not present

## 2015-09-25 DIAGNOSIS — L97521 Non-pressure chronic ulcer of other part of left foot limited to breakdown of skin: Secondary | ICD-10-CM | POA: Diagnosis not present

## 2015-09-25 DIAGNOSIS — E11621 Type 2 diabetes mellitus with foot ulcer: Secondary | ICD-10-CM | POA: Diagnosis not present

## 2015-09-26 DIAGNOSIS — L97521 Non-pressure chronic ulcer of other part of left foot limited to breakdown of skin: Secondary | ICD-10-CM | POA: Diagnosis not present

## 2015-09-26 DIAGNOSIS — E11621 Type 2 diabetes mellitus with foot ulcer: Secondary | ICD-10-CM | POA: Diagnosis not present

## 2015-09-26 DIAGNOSIS — E1151 Type 2 diabetes mellitus with diabetic peripheral angiopathy without gangrene: Secondary | ICD-10-CM | POA: Diagnosis not present

## 2015-09-26 DIAGNOSIS — I2510 Atherosclerotic heart disease of native coronary artery without angina pectoris: Secondary | ICD-10-CM | POA: Diagnosis not present

## 2015-09-26 DIAGNOSIS — Z48812 Encounter for surgical aftercare following surgery on the circulatory system: Secondary | ICD-10-CM | POA: Diagnosis not present

## 2015-09-26 DIAGNOSIS — I10 Essential (primary) hypertension: Secondary | ICD-10-CM | POA: Diagnosis not present

## 2015-09-27 DIAGNOSIS — I10 Essential (primary) hypertension: Secondary | ICD-10-CM | POA: Diagnosis not present

## 2015-09-27 DIAGNOSIS — E11621 Type 2 diabetes mellitus with foot ulcer: Secondary | ICD-10-CM | POA: Diagnosis not present

## 2015-09-27 DIAGNOSIS — E1151 Type 2 diabetes mellitus with diabetic peripheral angiopathy without gangrene: Secondary | ICD-10-CM | POA: Diagnosis not present

## 2015-09-27 DIAGNOSIS — Z48812 Encounter for surgical aftercare following surgery on the circulatory system: Secondary | ICD-10-CM | POA: Diagnosis not present

## 2015-09-27 DIAGNOSIS — L97521 Non-pressure chronic ulcer of other part of left foot limited to breakdown of skin: Secondary | ICD-10-CM | POA: Diagnosis not present

## 2015-09-27 DIAGNOSIS — I2510 Atherosclerotic heart disease of native coronary artery without angina pectoris: Secondary | ICD-10-CM | POA: Diagnosis not present

## 2015-09-28 ENCOUNTER — Encounter (HOSPITAL_COMMUNITY): Payer: Self-pay | Admitting: Cardiovascular Disease

## 2015-09-28 ENCOUNTER — Ambulatory Visit (HOSPITAL_COMMUNITY)
Admission: RE | Admit: 2015-09-28 | Discharge: 2015-09-29 | Disposition: A | Discharge status: Home / Self Care | Payer: Medicare Other | Source: Ambulatory Visit | Attending: Cardiovascular Disease | Admitting: Cardiovascular Disease

## 2015-09-28 ENCOUNTER — Ambulatory Visit (HOSPITAL_COMMUNITY)
Admission: RE | Disposition: A | Discharge status: Home / Self Care | Payer: Self-pay | Source: Ambulatory Visit | Attending: Cardiovascular Disease

## 2015-09-28 DIAGNOSIS — E785 Hyperlipidemia, unspecified: Secondary | ICD-10-CM | POA: Diagnosis not present

## 2015-09-28 DIAGNOSIS — E1151 Type 2 diabetes mellitus with diabetic peripheral angiopathy without gangrene: Secondary | ICD-10-CM | POA: Insufficient documentation

## 2015-09-28 DIAGNOSIS — I7092 Chronic total occlusion of artery of the extremities: Secondary | ICD-10-CM | POA: Insufficient documentation

## 2015-09-28 DIAGNOSIS — L97529 Non-pressure chronic ulcer of other part of left foot with unspecified severity: Secondary | ICD-10-CM | POA: Diagnosis not present

## 2015-09-28 DIAGNOSIS — Z7982 Long term (current) use of aspirin: Secondary | ICD-10-CM | POA: Insufficient documentation

## 2015-09-28 DIAGNOSIS — Z7984 Long term (current) use of oral hypoglycemic drugs: Secondary | ICD-10-CM | POA: Insufficient documentation

## 2015-09-28 DIAGNOSIS — Z955 Presence of coronary angioplasty implant and graft: Secondary | ICD-10-CM | POA: Diagnosis not present

## 2015-09-28 DIAGNOSIS — I252 Old myocardial infarction: Secondary | ICD-10-CM | POA: Diagnosis not present

## 2015-09-28 DIAGNOSIS — I2510 Atherosclerotic heart disease of native coronary artery without angina pectoris: Secondary | ICD-10-CM | POA: Diagnosis not present

## 2015-09-28 DIAGNOSIS — Z87891 Personal history of nicotine dependence: Secondary | ICD-10-CM | POA: Insufficient documentation

## 2015-09-28 DIAGNOSIS — Z7902 Long term (current) use of antithrombotics/antiplatelets: Secondary | ICD-10-CM | POA: Insufficient documentation

## 2015-09-28 DIAGNOSIS — Z880 Allergy status to penicillin: Secondary | ICD-10-CM | POA: Diagnosis not present

## 2015-09-28 DIAGNOSIS — I70245 Atherosclerosis of native arteries of left leg with ulceration of other part of foot: Secondary | ICD-10-CM | POA: Diagnosis not present

## 2015-09-28 DIAGNOSIS — I998 Other disorder of circulatory system: Secondary | ICD-10-CM

## 2015-09-28 DIAGNOSIS — E11621 Type 2 diabetes mellitus with foot ulcer: Secondary | ICD-10-CM | POA: Diagnosis not present

## 2015-09-28 DIAGNOSIS — Z91040 Latex allergy status: Secondary | ICD-10-CM | POA: Diagnosis not present

## 2015-09-28 DIAGNOSIS — Z794 Long term (current) use of insulin: Secondary | ICD-10-CM | POA: Diagnosis not present

## 2015-09-28 DIAGNOSIS — I70212 Atherosclerosis of native arteries of extremities with intermittent claudication, left leg: Secondary | ICD-10-CM | POA: Diagnosis not present

## 2015-09-28 DIAGNOSIS — I70229 Atherosclerosis of native arteries of extremities with rest pain, unspecified extremity: Secondary | ICD-10-CM

## 2015-09-28 DIAGNOSIS — E119 Type 2 diabetes mellitus without complications: Secondary | ICD-10-CM

## 2015-09-28 DIAGNOSIS — I10 Essential (primary) hypertension: Secondary | ICD-10-CM | POA: Insufficient documentation

## 2015-09-28 DIAGNOSIS — I739 Peripheral vascular disease, unspecified: Secondary | ICD-10-CM | POA: Diagnosis present

## 2015-09-28 HISTORY — PX: PERIPHERAL VASCULAR CATHETERIZATION: SHX172C

## 2015-09-28 LAB — GLUCOSE, CAPILLARY
GLUCOSE-CAPILLARY: 346 mg/dL — AB (ref 65–99)
GLUCOSE-CAPILLARY: 136 mg/dL — AB (ref 65–99)
Glucose-Capillary: 192 mg/dL — ABNORMAL HIGH (ref 65–99)
Glucose-Capillary: 179 mg/dL — ABNORMAL HIGH (ref 65–99)
Glucose-Capillary: 124 mg/dL — ABNORMAL HIGH (ref 65–99)

## 2015-09-28 LAB — POCT ACTIVATED CLOTTING TIME
Activated Clotting Time: 175 seconds
Activated Clotting Time: 175 seconds
Activated Clotting Time: 268 seconds
Activated Clotting Time: 180 seconds

## 2015-09-28 SURGERY — LOWER EXTREMITY ANGIOGRAPHY

## 2015-09-28 MED ORDER — FENTANYL CITRATE (PF) 100 MCG/2ML IJ SOLN
INTRAMUSCULAR | Status: AC
  Filled 2015-09-28: qty 2

## 2015-09-28 MED ORDER — ASPIRIN EC 81 MG PO TBEC: 81.0000 mg | DELAYED_RELEASE_TABLET | Freq: Every day | ORAL | Status: DC

## 2015-09-28 MED ORDER — ONDANSETRON HCL 4 MG/2ML IJ SOLN: 4.0000 mg | Freq: Four times a day (QID) | INTRAMUSCULAR | Status: DC | PRN

## 2015-09-28 MED ORDER — ASPIRIN EC 81 MG PO TBEC
81.0000 mg | DELAYED_RELEASE_TABLET | Freq: Every day | ORAL | Status: DC
  Administered 2015-09-29: 81 mg via ORAL
  Filled 2015-09-28: qty 1

## 2015-09-28 MED ORDER — SODIUM CHLORIDE 0.45 % IV SOLN
INTRAVENOUS | Status: AC
  Administered 2015-09-28: 15:00:00 via INTRAVENOUS

## 2015-09-28 MED ORDER — OXYCODONE-ACETAMINOPHEN 5-325 MG PO TABS
1.0000 | ORAL_TABLET | Freq: Four times a day (QID) | ORAL | Status: DC | PRN
  Administered 2015-09-28 – 2015-09-29 (×3): 2 via ORAL
  Filled 2015-09-28 (×3): qty 2

## 2015-09-28 MED ORDER — CLOPIDOGREL BISULFATE 75 MG PO TABS
ORAL_TABLET | ORAL | Status: AC
  Filled 2015-09-28: qty 1

## 2015-09-28 MED ORDER — INSULIN ASPART 100 UNIT/ML ~~LOC~~ SOLN
0.0000 [IU] | Freq: Three times a day (TID) | SUBCUTANEOUS | Status: DC
  Administered 2015-09-28: 18:00:00 11 [IU] via SUBCUTANEOUS
  Administered 2015-09-29: 14:00:00 5 [IU] via SUBCUTANEOUS

## 2015-09-28 MED ORDER — ZOLPIDEM TARTRATE 5 MG PO TABS: 5.0000 mg | ORAL_TABLET | Freq: Every evening | ORAL | Status: DC | PRN

## 2015-09-28 MED ORDER — ASPIRIN 81 MG PO CHEW
81.0000 mg | CHEWABLE_TABLET | ORAL | Status: AC
  Administered 2015-09-28: 81 mg via ORAL

## 2015-09-28 MED ORDER — LOSARTAN POTASSIUM 50 MG PO TABS
100.0000 mg | ORAL_TABLET | Freq: Every day | ORAL | Status: DC
  Administered 2015-09-28 – 2015-09-29 (×2): 100 mg via ORAL
  Filled 2015-09-28 (×2): qty 2

## 2015-09-28 MED ORDER — MIDAZOLAM HCL 2 MG/2ML IJ SOLN
INTRAMUSCULAR | Status: DC | PRN
  Administered 2015-09-28: 1 mg via INTRAVENOUS

## 2015-09-28 MED ORDER — FENTANYL CITRATE (PF) 100 MCG/2ML IJ SOLN
50.0000 ug | Freq: Once | INTRAMUSCULAR | Status: AC
  Administered 2015-09-28: 50 ug via INTRAVENOUS

## 2015-09-28 MED ORDER — HEPARIN SODIUM (PORCINE) 1000 UNIT/ML IJ SOLN
INTRAMUSCULAR | Status: DC | PRN
  Administered 2015-09-28: 4000 [IU] via INTRAVENOUS
  Administered 2015-09-28: 6000 [IU] via INTRAVENOUS
  Administered 2015-09-28: 2000 [IU] via INTRAVENOUS

## 2015-09-28 MED ORDER — SODIUM CHLORIDE 0.9 % WEIGHT BASED INFUSION: 1.0000 mL/kg/h | INTRAVENOUS | Status: DC

## 2015-09-28 MED ORDER — GABAPENTIN 300 MG PO CAPS
300.0000 mg | ORAL_CAPSULE | Freq: Two times a day (BID) | ORAL | Status: DC
  Administered 2015-09-28: 300 mg via ORAL
  Filled 2015-09-28: qty 1

## 2015-09-28 MED ORDER — HEPARIN SODIUM (PORCINE) 1000 UNIT/ML IJ SOLN
INTRAMUSCULAR | Status: AC
  Filled 2015-09-28: qty 1

## 2015-09-28 MED ORDER — HEPARIN (PORCINE) IN NACL 2-0.9 UNIT/ML-% IJ SOLN
INTRAMUSCULAR | Status: AC
  Filled 2015-09-28: qty 500

## 2015-09-28 MED ORDER — NITROGLYCERIN 1 MG/10 ML FOR IR/CATH LAB
INTRA_ARTERIAL | Status: AC
  Filled 2015-09-28: qty 10

## 2015-09-28 MED ORDER — FENTANYL CITRATE (PF) 100 MCG/2ML IJ SOLN
INTRAMUSCULAR | Status: DC | PRN
  Administered 2015-09-28 (×2): 25 ug via INTRAVENOUS

## 2015-09-28 MED ORDER — SODIUM CHLORIDE 0.9% FLUSH: 3.0000 mL | INTRAVENOUS | Status: DC | PRN

## 2015-09-28 MED ORDER — ALPRAZOLAM 0.25 MG PO TABS
0.2500 mg | ORAL_TABLET | Freq: Two times a day (BID) | ORAL | Status: DC | PRN
  Administered 2015-09-28 – 2015-09-29 (×2): 0.25 mg via ORAL
  Filled 2015-09-28 (×2): qty 1

## 2015-09-28 MED ORDER — MORPHINE SULFATE (PF) 2 MG/ML IV SOLN
2.0000 mg | INTRAVENOUS | Status: DC | PRN
  Administered 2015-09-28 – 2015-09-29 (×3): 2 mg via INTRAVENOUS
  Filled 2015-09-28 (×3): qty 1

## 2015-09-28 MED ORDER — PANTOPRAZOLE SODIUM 40 MG PO TBEC
40.0000 mg | DELAYED_RELEASE_TABLET | Freq: Every day | ORAL | Status: DC
  Administered 2015-09-28 – 2015-09-29 (×2): 40 mg via ORAL
  Filled 2015-09-28 (×2): qty 1

## 2015-09-28 MED ORDER — GABAPENTIN 300 MG PO CAPS
300.0000 mg | ORAL_CAPSULE | Freq: Two times a day (BID) | ORAL | Status: DC
  Administered 2015-09-28 – 2015-09-29 (×2): 300 mg via ORAL
  Filled 2015-09-28 (×2): qty 1

## 2015-09-28 MED ORDER — MIDAZOLAM HCL 2 MG/2ML IJ SOLN
INTRAMUSCULAR | Status: AC
  Filled 2015-09-28: qty 2

## 2015-09-28 MED ORDER — INSULIN GLARGINE 100 UNIT/ML SOLOSTAR PEN: 35.0000 [IU] | PEN_INJECTOR | Freq: Every day | SUBCUTANEOUS | Status: DC

## 2015-09-28 MED ORDER — ASPIRIN 81 MG PO CHEW
CHEWABLE_TABLET | ORAL | Status: AC
  Filled 2015-09-28: qty 1

## 2015-09-28 MED ORDER — FENTANYL CITRATE (PF) 100 MCG/2ML IJ SOLN
25.0000 ug | Freq: Once | INTRAMUSCULAR | Status: AC
  Administered 2015-09-28: 25 ug via INTRAVENOUS

## 2015-09-28 MED ORDER — CLOPIDOGREL BISULFATE 75 MG PO TABS
75.0000 mg | ORAL_TABLET | Freq: Every day | ORAL | Status: DC
  Administered 2015-09-29: 75 mg via ORAL
  Filled 2015-09-28: qty 1

## 2015-09-28 MED ORDER — NITROGLYCERIN 1 MG/10 ML FOR IR/CATH LAB
INTRA_ARTERIAL | Status: DC | PRN
  Administered 2015-09-28: 200 ug via INTRA_ARTERIAL

## 2015-09-28 MED ORDER — AMLODIPINE BESYLATE 5 MG PO TABS
5.0000 mg | ORAL_TABLET | Freq: Every day | ORAL | Status: DC
  Administered 2015-09-28 – 2015-09-29 (×2): 5 mg via ORAL
  Filled 2015-09-28 (×2): qty 1

## 2015-09-28 MED ORDER — HEPARIN (PORCINE) IN NACL 2-0.9 UNIT/ML-% IJ SOLN
INTRAMUSCULAR | Status: AC
  Filled 2015-09-28: qty 1000

## 2015-09-28 MED ORDER — ATORVASTATIN CALCIUM 80 MG PO TABS
80.0000 mg | ORAL_TABLET | Freq: Every day | ORAL | Status: DC
  Administered 2015-09-28 – 2015-09-29 (×2): 80 mg via ORAL
  Filled 2015-09-28 (×2): qty 1

## 2015-09-28 MED ORDER — INSULIN GLARGINE 100 UNIT/ML ~~LOC~~ SOLN
35.0000 [IU] | Freq: Every day | SUBCUTANEOUS | Status: DC
  Administered 2015-09-28: 35 [IU] via SUBCUTANEOUS
  Filled 2015-09-28 (×2): qty 0.35

## 2015-09-28 MED ORDER — ACETAMINOPHEN 325 MG PO TABS: 650.0000 mg | ORAL_TABLET | ORAL | Status: DC | PRN

## 2015-09-28 MED ORDER — CLOPIDOGREL BISULFATE 75 MG PO TABS
75.0000 mg | ORAL_TABLET | Freq: Once | ORAL | Status: AC
  Administered 2015-09-28: 75 mg via ORAL

## 2015-09-28 MED ORDER — LIDOCAINE HCL (PF) 1 % IJ SOLN
INTRAMUSCULAR | Status: AC
  Filled 2015-09-28: qty 30

## 2015-09-28 MED ORDER — SODIUM CHLORIDE 0.9 % WEIGHT BASED INFUSION
3.0000 mL/kg/h | INTRAVENOUS | Status: AC
  Administered 2015-09-28: 3 mL/kg/h via INTRAVENOUS

## 2015-09-28 MED ORDER — ISOSORBIDE MONONITRATE ER 60 MG PO TB24
90.0000 mg | ORAL_TABLET | Freq: Every day | ORAL | Status: DC
  Administered 2015-09-28 – 2015-09-29 (×2): 90 mg via ORAL
  Filled 2015-09-28 (×2): qty 1

## 2015-09-28 MED ORDER — IODIXANOL 320 MG/ML IV SOLN
INTRAVENOUS | Status: DC | PRN
  Administered 2015-09-28: 120 mL via INTRA_ARTERIAL

## 2015-09-28 SURGICAL SUPPLY — 20 items
BALLN ARMADA 4X40X135 (BALLOONS) ×4
BALLN ARMADA 6X20X135 (BALLOONS) ×4
BALLOON ARMADA 4X40X135 (BALLOONS) IMPLANT
BALLOON ARMADA 6X20X135 (BALLOONS) IMPLANT
CATH INFINITI JR4 5F (CATHETERS) ×2 IMPLANT
KIT ENCORE 26 ADVANTAGE (KITS) ×2 IMPLANT
KIT MICROINTRODUCER STIFF 5F (SHEATH) ×2 IMPLANT
KIT PV (KITS) ×4 IMPLANT
PACK CARDIAC CATHETERIZATION (CUSTOM PROCEDURE TRAY) ×2 IMPLANT
SHEATH PINNACLE 5F 10CM (SHEATH) IMPLANT
SHEATH PINNACLE 6F 10CM (SHEATH) ×2 IMPLANT
SHEATH PINNACLE R/O II 5F 6CM (SHEATH) ×2 IMPLANT
SHEATH SHUTTLE SELECT 6F (SHEATH) ×2 IMPLANT
STENT ABSOLUTE 9X40X135 (Permanent Stent) ×2 IMPLANT
SYRINGE MEDRAD AVANTA MACH 7 (SYRINGE) ×2 IMPLANT
TRANSDUCER W/STOPCOCK (MISCELLANEOUS) ×4 IMPLANT
TUBING CIL FLEX 10 FLL-RA (TUBING) ×2 IMPLANT
TUBING HIGH PRESSURE 120CM (CONNECTOR) ×4 IMPLANT
WIRE HI TORQ VERSACORE J 260CM (WIRE) ×2 IMPLANT
WIRE ROSEN-J .035X260CM (WIRE) ×2 IMPLANT

## 2015-09-28 NOTE — Care Management Note (Addendum)
Case Management Note  Patient Details  Name: [name] [name] MRN: [id number] Date of Birth: 17-Feb-1950  Subjective/Objective:  Arteriogram                  Action/Plan: Discharge Planning: NCM spoke to pt and lives at home with her dtr. Pt is active with [facility] PT/OT. Will send message to attending to see if pt is continue with [facility] once dc this admission. Pt is OIB and resumption of care orders not needed. Will follow up with [facility] on 09/29/2015. Pt states she has RW, cane and 3n1 at home.    Expected Discharge Date:                Expected Discharge Plan:  Home w Home Health Services  In-House Referral:  NA  Discharge planning Services  CM Consult  Post Acute Care Choice:  NA Choice offered to:  NA  DME Arranged:  N/A DME Agency:  NA  HH Arranged:  PT, OT HH Agency:  [facility]  Status of Service:  In process, will continue to follow  If discussed at Long Length of Stay Meetings, dates discussed:    Additional Comments:  Elliot Cousin, RN 09/28/2015, 5:54 PM

## 2015-09-28 NOTE — Interval H&P Note (Signed)
History and Physical Interval Note:  09/28/2015 7:37 AM  [name] [name]  has presented today for surgery, with the diagnosis of pad  The various methods of treatment have been discussed with the patient and family. After consideration of risks, benefits and other options for treatment, the patient has consented to  Procedure(s): Lower Extremity Angiography (N/A) as a surgical intervention .  The patient's history has been reviewed, patient examined, no change in status, stable for surgery.  I have reviewed the patient's chart and labs.  Questions were answered to the patient's satisfaction.     Nanetta Batty

## 2015-09-28 NOTE — H&P (View-Only) (Signed)
09/12/2015 [name] [name]   11/10/1950  [id number]  Primary Physician Georgann HousekeeperHUSAIN,KARRAR, MD Primary Cardiologist: Runell GessJonathan J Carmello Cabiness MD Roseanne RenoFACP, FACC, FAHA, FSCAI  HPI:  [name] [name] is a 65 year old Caucasian female patient of Dr. Landry DykeKelly's with a history of CAD and PAD. I last saw her  in the office 08/18/15. She has had right common and external iliac artery stenting as well as diamondback atherectomy of her right SFA, PTA and stenting of that vessel as well with known occluded left SFA. She's had redilatation of her right SFA for "in-stent restenosis. I re-angiogramed 06/04/11 Demonstrating a patent right common iliac artery stent, an occluded right SFA stent, 60% proximal left external iliac artery stenosis with a 23 mm gradient, 80% left common femoral artery stenosis. She has severe lifestyle including claudication as well as rest pain on the left with recent Dopplers performed 10/26/14 revealing a right ABI 0.49 and a left upper and 25. We decide to proceed with angiography and potential intervention of her left external iliac artery. She may ultimately require left common femoral endarterectomy and patch angioplasty plus or minus femoropopliteal bypass grafting.her other problems include history of CAD status post circumflex stenting and at the time of a myocardial infarction. She has a history of hypertension, diabetes and hyperlipidemia as well. She denies chest pain or shortness of breath. She has severe lifestyle limiting claudication, and now has rest pain with a nonhealing ulcer on her left foot. Her left lower extremity arterial Doppler studies performed 10/26/14 showed a right ABI 0.49 and a left of 0.25. Her SFAs were occluded and she had high-grade common femoral disease bilaterally. I angiogramed her 70/31/17 and performed diamondback orbital rotational atherectomy, PTCA and stenting of a high-grade calcified distal left common iliac artery stenosis. I did demonstrate high-grade calcified left common  femoral and profunda stenosis as well as a total left SFA with reconstitution in the popliteal artery and one vessel runoff via disease peroneal. Dr. Cari Carawayhris Dickson subsequently performed staged left common femoral endarterectomy with profundoplasty on 09/05/15. He had difficulty clamping the artery above and was unable to pass a wire and balloon for hemostasis. Since the procedure she says that her pain in her foot has somewhat improved and the ulcer seems to be healing slowly. I am going to refer her to the wound care center for further outpatient treatment. Dr. Edilia Boickson has asked me to perform angiography and potential intervention above his endarterectomy site via the left brachial approach.    Current Outpatient Prescriptions  Medication Sig Dispense Refill  . amLODipine (NORVASC) 5 MG tablet TAKE 1 TABLET(5 MG) BY MOUTH DAILY 30 tablet 11  . aspirin EC 81 MG tablet Take 81 mg by mouth daily.    Marland Kitchen. atorvastatin (LIPITOR) 80 MG tablet Take 1 tablet (80 mg total) by mouth daily. 90 tablet 1  . ciprofloxacin (CIPRO) 500 MG tablet Take 1 tablet (500 mg total) by mouth 2 (two) times daily. 14 tablet 0  . clopidogrel (PLAVIX) 75 MG tablet Take 1 tablet (75 mg total) by mouth daily. Holding for vascular surgery, restart as soon as possible after leg surgery (after 8/8) 90 tablet 3  . gabapentin (NEURONTIN) 100 MG capsule Take 300 mg by mouth 2 (two) times daily.   3  . Insulin Glargine (LANTUS) 100 UNIT/ML Solostar Pen Inject 35 Units into the skin daily at 10 pm.     . isosorbide mononitrate (IMDUR) 60 MG 24 hr tablet Take 60 mg by mouth daily.  Pt takes 1.5 tablets daily by mouth    . losartan (COZAAR) 100 MG tablet TAKE 1 TABLET BY MOUTH EVERY DAY 30 tablet 11  . metFORMIN (GLUCOPHAGE) 1000 MG tablet Take 1,000 mg by mouth 2 (two) times daily with a meal.    . metoprolol succinate (TOPROL-XL) 50 MG 24 hr tablet Take 1 tablet (50 mg total) by mouth daily. 30 tablet 2  . ondansetron (ZOFRAN ODT) 8 MG  disintegrating tablet Take 1 tablet (8 mg total) by mouth every 8 (eight) hours as needed for nausea or vomiting. 20 tablet 0  . oxyCODONE-acetaminophen (PERCOCET/ROXICET) 5-325 MG tablet Take 1-2 tablets by mouth every 6 (six) hours as needed for moderate pain. 20 tablet 0  . pantoprazole (PROTONIX) 40 MG tablet Take 1 tablet (40 mg total) by mouth daily. 30 tablet 10   No current facility-administered medications for this visit.     Allergies  Allergen Reactions  . Hydrochlorothiazide   . Latex Rash  . Penicillins Swelling and Rash    Has patient had a PCN reaction causing immediate rash, facial/tongue/throat swelling, SOB or lightheadedness with hypotension: Yes Has patient had a PCN reaction causing severe rash involving mucus membranes or skin necrosis: No Has patient had a PCN reaction that required hospitalization No Has patient had a PCN reaction occurring within the last 10 years: No If all of the above answers are "NO", then may proceed with Cephalosporin use.     Social History   Social History  . Marital status: Married    Spouse name: N/A  . Number of children: N/A  . Years of education: N/A   Occupational History  . Not on file.   Social History Main Topics  . Smoking status: Former Smoker    Packs/day: 1.50    Years: 41.00    Types: Cigarettes    Quit date: 10/12/2009  . Smokeless tobacco: Never Used  . Alcohol use No  . Drug use: No  . Sexual activity: Not Currently    Birth control/ protection: Surgical   Other Topics Concern  . Not on file   Social History Narrative  . No narrative on file     Review of Systems: General: negative for chills, fever, night sweats or weight changes.  Cardiovascular: negative for chest pain, dyspnea on exertion, edema, orthopnea, palpitations, paroxysmal nocturnal dyspnea or shortness of breath Dermatological: negative for rash Respiratory: negative for cough or wheezing Urologic: negative for hematuria Abdominal:  negative for nausea, vomiting, diarrhea, bright red blood per rectum, melena, or hematemesis Neurologic: negative for visual changes, syncope, or dizziness All other systems reviewed and are otherwise negative except as noted above.    Blood pressure (!) 155/76, pulse 97, height 5\' 4"  (1.626 m), weight 164 lb (74.4 kg).  General appearance: alert and no distress Neck: no adenopathy, no JVD, supple, symmetrical, trachea midline, thyroid not enlarged, symmetric, no tenderness/mass/nodules and Soft bilateral carotid bruits Lungs: clear to auscultation bilaterally Heart: regular rate and rhythm, S1, S2 normal, no murmur, click, rub or gallop Extremities: extremities normal, atraumatic, no cyanosis or edema and Nonhealing ulcer lateral aspect of left foot  EKG not performed today  ASSESSMENT AND PLAN:   PAD (peripheral artery disease) (HCC) [name] [name] returns today for follow-up. She had peripheral intervention performed by myself 08/28/15 with diamondback orbital rotational atherectomy, PTCA and stenting of a highly calcified high-grade distal left common iliac artery stenosis. She did have calcified left common femoral artery  and profunda disease as  well as a total left SFA with 1 vessel runoff via the peroneal. She ultimately underwent staged left common femoral artery endarterectomy and profundoplasty with patch of the left common femoral artery by Dr. Edilia Bo 09/05/15. She has critical limb ischemia with a nonhealing ulcer on the lateral aspect of her left foot.      Runell Gess MD FACP,FACC,FAHA, [facility] 09/12/2015 10:02 AM

## 2015-09-28 NOTE — Progress Notes (Signed)
41fr sheath aspirated and removed from left brachial artery. Manual pressure applied for 30 minutes. Slight hematoma was present proximal to insertion site, prior to removal. No hematoma present post hemostasis. Area of insertion tender. Tegaderm dressing applied, arm board support applied.   Left radial pulse easily palpable.  Bedrest begins at 12:45:00

## 2015-09-29 ENCOUNTER — Telehealth: Payer: Self-pay | Admitting: *Deleted

## 2015-09-29 ENCOUNTER — Encounter (HOSPITAL_COMMUNITY): Payer: Self-pay | Admitting: Cardiovascular Disease

## 2015-09-29 ENCOUNTER — Other Ambulatory Visit: Payer: Self-pay | Admitting: Physician Assistant

## 2015-09-29 DIAGNOSIS — E1151 Type 2 diabetes mellitus with diabetic peripheral angiopathy without gangrene: Secondary | ICD-10-CM | POA: Diagnosis not present

## 2015-09-29 DIAGNOSIS — I739 Peripheral vascular disease, unspecified: Secondary | ICD-10-CM

## 2015-09-29 DIAGNOSIS — I7092 Chronic total occlusion of artery of the extremities: Secondary | ICD-10-CM | POA: Diagnosis not present

## 2015-09-29 DIAGNOSIS — I998 Other disorder of circulatory system: Secondary | ICD-10-CM

## 2015-09-29 DIAGNOSIS — E11621 Type 2 diabetes mellitus with foot ulcer: Secondary | ICD-10-CM | POA: Diagnosis not present

## 2015-09-29 DIAGNOSIS — I2510 Atherosclerotic heart disease of native coronary artery without angina pectoris: Secondary | ICD-10-CM | POA: Diagnosis not present

## 2015-09-29 DIAGNOSIS — L97529 Non-pressure chronic ulcer of other part of left foot with unspecified severity: Secondary | ICD-10-CM | POA: Diagnosis not present

## 2015-09-29 DIAGNOSIS — I70245 Atherosclerosis of native arteries of left leg with ulceration of other part of foot: Secondary | ICD-10-CM | POA: Diagnosis not present

## 2015-09-29 LAB — CBC
HEMATOCRIT: 30.1 % — AB (ref 36.0–46.0)
HEMOGLOBIN: 9.9 g/dL — AB (ref 12.0–15.0)
MCH: 28.3 pg (ref 26.0–34.0)
MCHC: 32.9 g/dL (ref 30.0–36.0)
MCV: 86 fL (ref 78.0–100.0)
Platelets: 223 10*3/uL (ref 150–400)
RBC: 3.5 MIL/uL — AB (ref 3.87–5.11)
RDW: 13.7 % (ref 11.5–15.5)
WBC: 8 10*3/uL (ref 4.0–10.5)

## 2015-09-29 LAB — BASIC METABOLIC PANEL
Anion gap: 8 (ref 5–15)
BUN: 12 mg/dL (ref 6–20)
CHLORIDE: 108 mmol/L (ref 101–111)
CO2: 23 mmol/L (ref 22–32)
Calcium: 8.5 mg/dL — ABNORMAL LOW (ref 8.9–10.3)
Creatinine, Ser: 0.64 mg/dL (ref 0.44–1.00)
GFR calc non Af Amer: >60 mL/min (ref >60)
Glucose, Bld: 100 mg/dL — ABNORMAL HIGH (ref 65–99)
POTASSIUM: 3.7 mmol/L (ref 3.5–5.1)
SODIUM: 139 mmol/L (ref 135–145)

## 2015-09-29 LAB — GLUCOSE, CAPILLARY
GLUCOSE-CAPILLARY: 224 mg/dL — AB (ref 65–99)
Glucose-Capillary: 97 mg/dL (ref 65–99)

## 2015-09-29 MED ORDER — HEPARIN (PORCINE) IN NACL 2-0.9 UNIT/ML-% IJ SOLN
INTRAMUSCULAR | Status: DC | PRN
  Administered 2015-09-28: 1000 mL

## 2015-09-29 MED FILL — Heparin Sodium (Porcine) 2 Unit/ML in Sodium Chloride 0.9%: INTRAMUSCULAR | Qty: 500 | Status: CN

## 2015-09-29 MED FILL — Lidocaine HCl Local Preservative Free (PF) Inj 1%: INTRAMUSCULAR | Qty: 30 | Status: AC

## 2015-09-29 MED FILL — Heparin Sodium (Porcine) 2 Unit/ML in Sodium Chloride 0.9%: INTRAMUSCULAR | Qty: 1000 | Status: AC

## 2015-09-29 NOTE — Telephone Encounter (Signed)
[facility] orders for begin date 09/13/15 signed by Dr Allyson Sabal and faxed to number provided.

## 2015-09-29 NOTE — Discharge Instructions (Signed)
HOLD METFORMIN FOR 48 HOURS.   PLEASE REMEMBER TO BRING ALL OF YOUR MEDICATIONS TO EACH OF YOUR FOLLOW-UP OFFICE VISITS.  PLEASE ATTEND ALL SCHEDULED FOLLOW-UP APPOINTMENTS.   Activity: Increase activity slowly as tolerated. You may shower, but no soaking baths (or swimming) for 1 week. No driving for 2 days. No lifting over 5 lbs for 1 week. No sexual activity for 1 week.   You May Return to Work: in 1 week (if applicable)  Wound Care: You may wash cath site gently with soap and water. Keep cath site clean and dry. If you notice pain, swelling, bleeding or pus at your cath site, please call [phone].    Cardiac Cath Site Care Refer to this sheet in the next few weeks. These instructions provide you with information on caring for yourself after your procedure. Your caregiver may also give you more specific instructions. Your treatment has been planned according to current medical practices, but problems sometimes occur. Call your caregiver if you have any problems or questions after your procedure. HOME CARE INSTRUCTIONS  You may shower 24 hours after the procedure. Remove the bandage (dressing) and gently wash the site with plain soap and water. Gently pat the site dry.   Do not apply powder or lotion to the site.   Do not sit in a bathtub, swimming pool, or whirlpool for 5 to 7 days.   No bending, squatting, or lifting anything over 10 pounds (4.5 kg) as directed by your caregiver.   Inspect the site at least twice daily.   Do not drive home if you are discharged the same day of the procedure. Have someone else drive you.   You may drive 24 hours after the procedure unless otherwise instructed by your caregiver.  What to expect:  Any bruising will usually fade within 1 to 2 weeks.   Blood that collects in the tissue (hematoma) may be painful to the touch. It should usually decrease in size and tenderness within 1 to 2 weeks.  SEEK IMMEDIATE MEDICAL CARE IF:  You have  unusual pain at the site or down the affected limb.   You have redness, warmth, swelling, or pain at the site.   You have drainage (other than a small amount of blood on the dressing).   You have chills.   You have a fever or persistent symptoms for more than 72 hours.   You have a fever and your symptoms suddenly get worse.   Your leg becomes pale, cool, tingly, or numb.   You have heavy bleeding from the site. Hold pressure on the site.  Document Released: 02/16/2010 Document Revised: 01/03/2011 Document Reviewed: 02/16/2010 [facility] Patient Information 2012 [place], [place].

## 2015-09-29 NOTE — Progress Notes (Signed)
Subjective:  Left brachial puncture site OK. Left groin with 2+ pulse. Left foot mildly painful  Objective:  Temp:  [97.3 F (36.3 C)-98 F (36.7 C)] 97.3 F (36.3 C) (09/01 0321) Pulse Rate:  [69-87] 72 (09/01 0500) Resp:  [10-27] 15 (09/01 0500) BP: (113-181)/(38-61) 124/46 (09/01 0500) SpO2:  [92 %-100 %] 92 % (09/01 0500) Weight:  [170 lb 3.1 oz (77.2 kg)] 170 lb 3.1 oz (77.2 kg) (09/01 0321) Weight change: 5 lb 3.1 oz (2.356 kg)  Intake/Output from previous day: 08/31 0701 - 09/01 0700 In: 2066.3 [P.O.:740; I.V.:676.3] Out: 2300 [Urine:2300]  Intake/Output from this shift: No intake/output data recorded.  Physical Exam: General appearance: alert and no distress Neck: no adenopathy, no carotid bruit, no JVD, supple, symmetrical, trachea midline and thyroid not enlarged, symmetric, no tenderness/mass/nodules Lungs: clear to auscultation bilaterally Heart: regular rate and rhythm, S1, S2 normal, no murmur, click, rub or gallop Extremities: extremities normal, atraumatic, no cyanosis or edema and Ulcer medial aspect Left foot  Lab Results: Results for orders placed or performed during the hospital encounter of 09/28/15 (from the past 48 hour(s))  Glucose, capillary     Status: Abnormal   Collection Time: 09/28/15  5:55 AM  Result Value Ref Range   Glucose-Capillary 192 (H) 65 - 99 mg/dL  POCT Activated clotting time     Status: None   Collection Time: 09/28/15  8:41 AM  Result Value Ref Range   Activated Clotting Time 175 seconds  POCT Activated clotting time     Status: None   Collection Time: 09/28/15  8:42 AM  Result Value Ref Range   Activated Clotting Time 175 seconds  POCT Activated clotting time     Status: None   Collection Time: 09/28/15  8:59 AM  Result Value Ref Range   Activated Clotting Time 268 seconds  Glucose, capillary     Status: Abnormal   Collection Time: 09/28/15  9:44 AM  Result Value Ref Range   Glucose-Capillary 179 (H) 65 - 99  mg/dL  POCT Activated clotting time     Status: None   Collection Time: 09/28/15 11:04 AM  Result Value Ref Range   Activated Clotting Time 180 seconds  Glucose, capillary     Status: Abnormal   Collection Time: 09/28/15  1:43 PM  Result Value Ref Range   Glucose-Capillary 124 (H) 65 - 99 mg/dL   Comment 1 Notify RN   Glucose, capillary     Status: Abnormal   Collection Time: 09/28/15  5:39 PM  Result Value Ref Range   Glucose-Capillary 346 (H) 65 - 99 mg/dL  Glucose, capillary     Status: Abnormal   Collection Time: 09/28/15  9:09 PM  Result Value Ref Range   Glucose-Capillary 136 (H) 65 - 99 mg/dL  CBC     Status: Abnormal   Collection Time: 09/29/15  3:11 AM  Result Value Ref Range   WBC 8.0 4.0 - 10.5 K/uL   RBC 3.50 (L) 3.87 - 5.11 MIL/uL   Hemoglobin 9.9 (L) 12.0 - 15.0 g/dL   HCT 30.1 (L) 36.0 - 46.0 %   MCV 86.0 78.0 - 100.0 fL   MCH 28.3 26.0 - 34.0 pg   MCHC 32.9 30.0 - 36.0 g/dL   RDW 13.7 11.5 - 15.5 %   Platelets 223 150 - 400 K/uL  Basic metabolic panel     Status: Abnormal   Collection Time: 09/29/15  3:11 AM  Result Value Ref Range  Sodium 139 135 - 145 mmol/L   Potassium 3.7 3.5 - 5.1 mmol/L   Chloride 108 101 - 111 mmol/L   CO2 23 22 - 32 mmol/L   Glucose, Bld 100 (H) 65 - 99 mg/dL   BUN 12 6 - 20 mg/dL   Creatinine, Ser 0.64 0.44 - 1.00 mg/dL   Calcium 8.5 (L) 8.9 - 10.3 mg/dL   GFR calc non Af Amer >60 >60 mL/min   GFR calc Af Amer >60 >60 mL/min    Comment: (NOTE) The eGFR has been calculated using the CKD EPI equation. This calculation has not been validated in all clinical situations. eGFR's persistently <60 mL/min signify possible Chronic Kidney Disease.    Anion gap 8 5 - 15  Glucose, capillary     Status: None   Collection Time: 09/29/15  6:08 AM  Result Value Ref Range   Glucose-Capillary 97 65 - 99 mg/dL    Imaging: Imaging results have been reviewed  Tele- NSR  Assessment/Plan:   1. Active Problems: 2.   PAD (peripheral  artery disease) ([place]) 3.   PVD (peripheral vascular disease) with claudication ([place]) 4. CAD 5. Critical limb ischemia  Time Spent Directly with Patient:  20 minutes  Length of Stay:  LOS: 0 days   S/P LCFA PTA/Stent at proximal edge of recently placed LCFA endarterectomy with patch angioplasty for CLI. Exam benign. Labs OK. Palpable LCFA pulse with soft bruit.. OK for DC home today on DAPT. She has an appointment already next Wednesday at the [facility]. Will arrange LLE LEA at [facility] next week then ROV with me 2-3 weeks.  Quay Burow 09/29/2015, 7:50 AM

## 2015-09-29 NOTE — Consult Note (Signed)
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  Apply medi honey to wound base.  Cover with 2x2 and wrap with kerlix.  Change daily.  [facility] and is unable to afford Santyl treatment.  Pt has been using medi honey per [facility] recommendations.  Pt will return to [facility] upon discharge.  Discussed elevation and diabetic shoe use.  Pt reports that she is aware of need to do both.  Pt to be discharged this morning.  Dressing changed by this nurse.  Discussed POC with patient and bedside nurse.  Re consult if needed, will not follow at this time. Thank you, Henriette Combs BSN, RN, [facility]

## 2015-09-29 NOTE — Discharge Summary (Signed)
Discharge Summary    Patient ID: [name] [name],  MRN: [id number], DOB/AGE: 1950/04/09 65 y.o.  Admit date: 09/28/2015 Discharge date: 09/29/2015  Primary Care Provider: WUJWJX,BJYNWGHUSAIN,KARRAR Primary Cardiologist: Dr Tresa EndoKelly, Dr Allyson SabalBerry  Discharge Diagnoses    Active Problems:   PAD (peripheral artery disease) ([facility])   PVD (peripheral vascular disease) with claudication ([facility])  Allergies Allergies  Allergen Reactions  . Hydrochlorothiazide Other (See Comments)    Unknown  . Latex Rash  . Penicillins Swelling, Rash and Other (See Comments)    Has patient had a PCN reaction causing immediate rash, facial/tongue/throat swelling, SOB or lightheadedness with hypotension: Yes Has patient had a PCN reaction causing severe rash involving mucus membranes or skin necrosis: No Has patient had a PCN reaction that required hospitalization No Has patient had a PCN reaction occurring within the last 10 years: No If all of the above answers are "NO", then may proceed with Cephalosporin use.     Diagnostic Studies/Procedures    Procedures Performed:                      1. Left lower extremity angiogram with runoff                      2. PTA and 9 mm x 4 cm Abbott nitinol absolute Pro self-expanding stent to the distal left common femoral artery Final Impression: Successful PT a and self-expanding stenting of a high-grade distal left common femoral artery stenosis at the proximal edge of the endarterectomy patch for critical limb ischemia and nonhealing wound. The patient does have a diseased but patent below the knee popliteal artery and peroneal artery supplying the dorsalis pedis at the foot. If her wound does not heal she may be a candidate for a Fem -distal anterior tibial/dorsalis pedis bypass. The patient is on dual antiplatelet therapy. She'll be discharged home in the morning. We will get follow-up low lower extremity arterial Doppler [facility]  [facility]. I will see her back the following week. The intervention wasreviewed with Dr. Cari Carawayhris Dickson. _____________   History of Present Illness     65 year old Caucasian female patient of Dr. Landry DykeKelly's with a history of CAD and PAD. She has a history of hypertension, diabetes and hyperlipidemia as well. I last saw her  in the office 08/18/15. She has had right common and external iliac artery stenting as well as diamondback atherectomy of her right SFA, PTA and stenting of that vessel as well with known occluded left SFA. She's had redilatation of her right SFA for "in-stent restenosis.  She has severe lifestyle including claudication as well as rest pain on the left. Dr. Cari Carawayhris Dickson subsequently performed staged left common femoral endarterectomy with profundoplasty on 09/05/15. He had difficulty clamping the artery above and was unable to pass a wire and balloon for hemostasis. Dr. Edilia Boickson has asked me to perform angiography and potential intervention above his endarterectomy site via the left brachial approach.  Hospital Course     Consultants: None   She came to the hospital for the procedure on 09/28/2015. Results are above, she had stenting to the L CFA, and tolerated the procedure well.   On 09/01, she was seen by Dr Allyson SabalBerry, all data reviewed. Her post-procedure labs showed a slightly lower H&H, but there was some blood loss during the procedure as well as a possible dilutional component. Her glucose  was controlled. Her cath site was without hematoma.  No further inpatient workup was indicated and she is considered stable for discharge, to follow up as an outpatient.   _____________  Discharge Vitals Blood pressure (!) 124/46, pulse 72, temperature 97.3 F (36.3 C), temperature source Axillary, resp. rate 15, height 5\' 4"  (1.626 m), weight 170 lb 3.1 oz (77.2 kg), SpO2 92 %.  Filed Weights   09/28/15 0544 09/29/15 0321  Weight: 165 lb (74.8 kg) 170 lb 3.1 oz (77.2 kg)    Labs &  Radiologic Studies    CBC  Recent Labs  09/29/15 0311  WBC 8.0  HGB 9.9*  HCT 30.1*  MCV 86.0  PLT 223   Basic Metabolic Panel  Recent Labs  09/29/15 0311  NA 139  K 3.7  CL 108  CO2 23  GLUCOSE 100*  BUN 12  CREATININE 0.64  CALCIUM 8.5*  _____________   Disposition   Pt is being discharged home today in good condition.  Follow-up Plans & Appointments    Follow-up Information    [facility] Follow up on 10/09/2015.   Specialty:  Cardiology Why:  Please arrive at 3:15 pm for Doppler study at 3:30 pm. Contact information: [address] [place] [address] [phone]       Nanetta Batty, MD .   Specialties:  Cardiology, Radiology Why:  The office will call for an appointment. Contact information: [address] [place] [address] [phone]        Nicki Guadalajara, MD .   Specialty:  Cardiology Why:  As scheduled Contact information: [address] [place] [address] [phone]          Discharge Instructions    Diet - low sodium heart healthy    Complete by:  As directed   Diet Carb Modified    Complete by:  As directed   Increase activity slowly    Complete by:  As directed      Discharge Medications   Current Discharge Medication List    CONTINUE these medications which have NOT CHANGED   Details  amLODipine (NORVASC) 5 MG tablet TAKE 1 TABLET(5 MG) BY MOUTH DAILY Qty: 30 tablet, Refills: 11    aspirin EC 81 MG tablet Take 81 mg by mouth daily.    atorvastatin (LIPITOR) 80 MG tablet Take 1 tablet (80 mg total) by mouth daily. Qty: 90 tablet, Refills: 1    ciprofloxacin (CIPRO) 500 MG tablet Take 1 tablet (500 mg total) by mouth 2 (two) times daily. Qty: 14 tablet, Refills: 0    clopidogrel (PLAVIX) 75 MG tablet Take 1 tablet (75 mg total) by mouth daily. Holding for vascular surgery, restart as soon as possible after leg surgery (after 8/8) Qty: 90  tablet, Refills: 3    gabapentin (NEURONTIN) 100 MG capsule Take 300 mg by mouth 2 (two) times daily.  Refills: 3    Insulin Glargine (LANTUS) 100 UNIT/ML Solostar Pen Inject 35 Units into the skin daily at 10 pm.     isosorbide mononitrate (IMDUR) 60 MG 24 hr tablet Take 90 mg by mouth daily.     losartan (COZAAR) 100 MG tablet TAKE 1 TABLET BY MOUTH EVERY DAY Qty: 30 tablet, Refills: 11    metFORMIN (GLUCOPHAGE) 1000 MG tablet Take 1,000 mg by mouth 2 (two) times daily with a meal.    metoprolol succinate (TOPROL-XL) 50 MG 24 hr tablet Take 1 tablet (50 mg total)  by mouth daily. Qty: 30 tablet, Refills: 2    OVER THE COUNTER MEDICATION Apply 1 application topically daily. [place]    oxyCODONE-acetaminophen (PERCOCET/ROXICET) 5-325 MG tablet Take 1-2 tablets by mouth every 6 (six) hours as needed for moderate pain. Qty: 20 tablet, Refills: 0    pantoprazole (PROTONIX) 40 MG tablet Take 1 tablet (40 mg total) by mouth daily. Qty: 30 tablet, Refills: 10      STOP taking these medications     ondansetron (ZOFRAN ODT) 8 MG disintegrating tablet          Outstanding Labs/Studies   None  Duration of Discharge Encounter   Greater than 30 minutes including physician time.  Melida Quitter NP 09/29/2015, 11:26 AM

## 2015-09-29 NOTE — Care Management Note (Signed)
Case Management Note  Patient Details  Name: MICHEALLA EARNHARDT MRN: [id number] Date of Birth: 05-05-1950  Subjective/Objective:    Patient is from home s/p LE angiography , she is active with [facility] for [facility], PT, OT, she is OIB so no resume order needed.  Per Ok Edwards, MD states he still wants her to resume this at home.  Patient is in agreement as well.  NCM notified Lupita Leash with [facility] patient is for dc today.                  Action/Plan:   Expected Discharge Date:                  Expected Discharge Plan:  Home w Home Health Services  In-House Referral:  NA  Discharge planning Services  CM Consult  Post Acute Care Choice:  NA Choice offered to:  NA  DME Arranged:  N/A DME Agency:  NA  HH Arranged:  PT, OT, RN HH Agency:  [facility]  Status of Service:  Completed, signed off  If discussed at Long Length of Stay Meetings, dates discussed:    Additional Comments:  Leone Haven, RN 09/29/2015, 11:07 AM

## 2015-10-04 ENCOUNTER — Encounter (HOSPITAL_BASED_OUTPATIENT_CLINIC_OR_DEPARTMENT_OTHER): Payer: Medicare Other | Attending: Surgery

## 2015-10-04 ENCOUNTER — Other Ambulatory Visit: Payer: Self-pay | Admitting: Cardiovascular Disease

## 2015-10-04 DIAGNOSIS — I10 Essential (primary) hypertension: Secondary | ICD-10-CM | POA: Diagnosis not present

## 2015-10-04 DIAGNOSIS — I70245 Atherosclerosis of native arteries of left leg with ulceration of other part of foot: Secondary | ICD-10-CM | POA: Diagnosis not present

## 2015-10-04 DIAGNOSIS — E1140 Type 2 diabetes mellitus with diabetic neuropathy, unspecified: Secondary | ICD-10-CM | POA: Insufficient documentation

## 2015-10-04 DIAGNOSIS — I739 Peripheral vascular disease, unspecified: Secondary | ICD-10-CM

## 2015-10-04 DIAGNOSIS — E1151 Type 2 diabetes mellitus with diabetic peripheral angiopathy without gangrene: Secondary | ICD-10-CM | POA: Insufficient documentation

## 2015-10-04 DIAGNOSIS — Z87891 Personal history of nicotine dependence: Secondary | ICD-10-CM | POA: Diagnosis not present

## 2015-10-04 DIAGNOSIS — E11621 Type 2 diabetes mellitus with foot ulcer: Secondary | ICD-10-CM | POA: Diagnosis not present

## 2015-10-04 DIAGNOSIS — Z955 Presence of coronary angioplasty implant and graft: Secondary | ICD-10-CM | POA: Insufficient documentation

## 2015-10-04 DIAGNOSIS — L97521 Non-pressure chronic ulcer of other part of left foot limited to breakdown of skin: Secondary | ICD-10-CM | POA: Insufficient documentation

## 2015-10-04 DIAGNOSIS — I252 Old myocardial infarction: Secondary | ICD-10-CM | POA: Diagnosis not present

## 2015-10-04 DIAGNOSIS — I2510 Atherosclerotic heart disease of native coronary artery without angina pectoris: Secondary | ICD-10-CM | POA: Diagnosis not present

## 2015-10-04 DIAGNOSIS — Z95820 Peripheral vascular angioplasty status with implants and grafts: Secondary | ICD-10-CM | POA: Insufficient documentation

## 2015-10-04 DIAGNOSIS — Z48812 Encounter for surgical aftercare following surgery on the circulatory system: Secondary | ICD-10-CM | POA: Diagnosis not present

## 2015-10-05 ENCOUNTER — Encounter: Payer: Self-pay | Admitting: Vascular Surgery

## 2015-10-05 DIAGNOSIS — I10 Essential (primary) hypertension: Secondary | ICD-10-CM | POA: Diagnosis not present

## 2015-10-05 DIAGNOSIS — I2510 Atherosclerotic heart disease of native coronary artery without angina pectoris: Secondary | ICD-10-CM | POA: Diagnosis not present

## 2015-10-05 DIAGNOSIS — E1151 Type 2 diabetes mellitus with diabetic peripheral angiopathy without gangrene: Secondary | ICD-10-CM | POA: Diagnosis not present

## 2015-10-05 DIAGNOSIS — Z48812 Encounter for surgical aftercare following surgery on the circulatory system: Secondary | ICD-10-CM | POA: Diagnosis not present

## 2015-10-05 DIAGNOSIS — L97521 Non-pressure chronic ulcer of other part of left foot limited to breakdown of skin: Secondary | ICD-10-CM | POA: Diagnosis not present

## 2015-10-05 DIAGNOSIS — E11621 Type 2 diabetes mellitus with foot ulcer: Secondary | ICD-10-CM | POA: Diagnosis not present

## 2015-10-06 ENCOUNTER — Telehealth: Payer: Self-pay | Admitting: *Deleted

## 2015-10-06 DIAGNOSIS — E1151 Type 2 diabetes mellitus with diabetic peripheral angiopathy without gangrene: Secondary | ICD-10-CM | POA: Diagnosis not present

## 2015-10-06 DIAGNOSIS — Z48812 Encounter for surgical aftercare following surgery on the circulatory system: Secondary | ICD-10-CM | POA: Diagnosis not present

## 2015-10-06 DIAGNOSIS — E11621 Type 2 diabetes mellitus with foot ulcer: Secondary | ICD-10-CM | POA: Diagnosis not present

## 2015-10-06 DIAGNOSIS — I2510 Atherosclerotic heart disease of native coronary artery without angina pectoris: Secondary | ICD-10-CM | POA: Diagnosis not present

## 2015-10-06 DIAGNOSIS — L97521 Non-pressure chronic ulcer of other part of left foot limited to breakdown of skin: Secondary | ICD-10-CM | POA: Diagnosis not present

## 2015-10-06 DIAGNOSIS — I10 Essential (primary) hypertension: Secondary | ICD-10-CM | POA: Diagnosis not present

## 2015-10-06 NOTE — Telephone Encounter (Signed)
[facility] from [place] office PT orders for begin date 09/11/15 to end date 10/14/15, signed by Dr Allyson Sabal and successfully faxed to number provided.

## 2015-10-06 NOTE — Telephone Encounter (Signed)
Patient called requesting oxycodone. She states that she has "some" but did not want to run out over the week end.  I explained that she would need to be seen in our office before any pain medication is issued.   It was 2:40 pm at that time.  She did not request scheduling an appointment.

## 2015-10-09 ENCOUNTER — Inpatient Hospital Stay (HOSPITAL_COMMUNITY): Admit: 2015-10-09 | Payer: Medicare Other

## 2015-10-10 DIAGNOSIS — I739 Peripheral vascular disease, unspecified: Secondary | ICD-10-CM | POA: Diagnosis not present

## 2015-10-10 DIAGNOSIS — I2510 Atherosclerotic heart disease of native coronary artery without angina pectoris: Secondary | ICD-10-CM | POA: Diagnosis not present

## 2015-10-10 DIAGNOSIS — I252 Old myocardial infarction: Secondary | ICD-10-CM | POA: Diagnosis not present

## 2015-10-10 DIAGNOSIS — E1140 Type 2 diabetes mellitus with diabetic neuropathy, unspecified: Secondary | ICD-10-CM | POA: Diagnosis not present

## 2015-10-10 DIAGNOSIS — Z780 Asymptomatic menopausal state: Secondary | ICD-10-CM | POA: Diagnosis not present

## 2015-10-10 DIAGNOSIS — E1159 Type 2 diabetes mellitus with other circulatory complications: Secondary | ICD-10-CM | POA: Diagnosis not present

## 2015-10-10 DIAGNOSIS — Z1159 Encounter for screening for other viral diseases: Secondary | ICD-10-CM | POA: Diagnosis not present

## 2015-10-10 DIAGNOSIS — Z794 Long term (current) use of insulin: Secondary | ICD-10-CM | POA: Diagnosis not present

## 2015-10-10 DIAGNOSIS — I10 Essential (primary) hypertension: Secondary | ICD-10-CM | POA: Diagnosis not present

## 2015-10-10 DIAGNOSIS — E782 Mixed hyperlipidemia: Secondary | ICD-10-CM | POA: Diagnosis not present

## 2015-10-10 DIAGNOSIS — Z0000 Encounter for general adult medical examination without abnormal findings: Secondary | ICD-10-CM | POA: Diagnosis not present

## 2015-10-10 DIAGNOSIS — L97509 Non-pressure chronic ulcer of other part of unspecified foot with unspecified severity: Secondary | ICD-10-CM | POA: Diagnosis not present

## 2015-10-10 DIAGNOSIS — Z23 Encounter for immunization: Secondary | ICD-10-CM | POA: Diagnosis not present

## 2015-10-11 ENCOUNTER — Ambulatory Visit: Payer: Medicare Other | Admitting: Vascular Surgery

## 2015-10-12 ENCOUNTER — Telehealth: Payer: Self-pay | Admitting: *Deleted

## 2015-10-12 DIAGNOSIS — E1151 Type 2 diabetes mellitus with diabetic peripheral angiopathy without gangrene: Secondary | ICD-10-CM | POA: Diagnosis not present

## 2015-10-12 DIAGNOSIS — I10 Essential (primary) hypertension: Secondary | ICD-10-CM | POA: Diagnosis not present

## 2015-10-12 DIAGNOSIS — Z48812 Encounter for surgical aftercare following surgery on the circulatory system: Secondary | ICD-10-CM | POA: Diagnosis not present

## 2015-10-12 DIAGNOSIS — E11621 Type 2 diabetes mellitus with foot ulcer: Secondary | ICD-10-CM | POA: Diagnosis not present

## 2015-10-12 DIAGNOSIS — L97521 Non-pressure chronic ulcer of other part of left foot limited to breakdown of skin: Secondary | ICD-10-CM | POA: Diagnosis not present

## 2015-10-12 DIAGNOSIS — I2510 Atherosclerotic heart disease of native coronary artery without angina pectoris: Secondary | ICD-10-CM | POA: Diagnosis not present

## 2015-10-12 NOTE — Telephone Encounter (Signed)
French Ana, from [facility] called stating that while reviewing patients medications, patient states that she does not recall if all Cipro was taken that was prescribed 09/07/15  by Lelon Mast Ryne  PA upon discharge for a mild temperature. She states that she has since lost the bottle.  The patient was in for office visit with Dr Edilia Bo 09/20/2015 and showed no signs of infection or fever, temp was 97.8. Patient is to follow up with Dr Edilia Bo after left LE arterial duplex by Dr Allyson Sabal.

## 2015-10-13 ENCOUNTER — Other Ambulatory Visit: Payer: Self-pay | Admitting: Cardiovascular Disease

## 2015-10-13 DIAGNOSIS — I739 Peripheral vascular disease, unspecified: Secondary | ICD-10-CM

## 2015-10-13 DIAGNOSIS — E11621 Type 2 diabetes mellitus with foot ulcer: Secondary | ICD-10-CM | POA: Diagnosis not present

## 2015-10-13 DIAGNOSIS — E1151 Type 2 diabetes mellitus with diabetic peripheral angiopathy without gangrene: Secondary | ICD-10-CM | POA: Diagnosis not present

## 2015-10-13 DIAGNOSIS — L97521 Non-pressure chronic ulcer of other part of left foot limited to breakdown of skin: Secondary | ICD-10-CM | POA: Diagnosis not present

## 2015-10-13 DIAGNOSIS — Z48812 Encounter for surgical aftercare following surgery on the circulatory system: Secondary | ICD-10-CM | POA: Diagnosis not present

## 2015-10-13 DIAGNOSIS — I2510 Atherosclerotic heart disease of native coronary artery without angina pectoris: Secondary | ICD-10-CM | POA: Diagnosis not present

## 2015-10-13 DIAGNOSIS — I10 Essential (primary) hypertension: Secondary | ICD-10-CM | POA: Diagnosis not present

## 2015-10-16 DIAGNOSIS — E1151 Type 2 diabetes mellitus with diabetic peripheral angiopathy without gangrene: Secondary | ICD-10-CM | POA: Diagnosis not present

## 2015-10-16 DIAGNOSIS — E11621 Type 2 diabetes mellitus with foot ulcer: Secondary | ICD-10-CM | POA: Diagnosis not present

## 2015-10-16 DIAGNOSIS — G43909 Migraine, unspecified, not intractable, without status migrainosus: Secondary | ICD-10-CM | POA: Diagnosis not present

## 2015-10-16 DIAGNOSIS — Z792 Long term (current) use of antibiotics: Secondary | ICD-10-CM | POA: Diagnosis not present

## 2015-10-16 DIAGNOSIS — Z7902 Long term (current) use of antithrombotics/antiplatelets: Secondary | ICD-10-CM | POA: Diagnosis not present

## 2015-10-16 DIAGNOSIS — M1991 Primary osteoarthritis, unspecified site: Secondary | ICD-10-CM | POA: Diagnosis not present

## 2015-10-16 DIAGNOSIS — Z48812 Encounter for surgical aftercare following surgery on the circulatory system: Secondary | ICD-10-CM | POA: Diagnosis not present

## 2015-10-16 DIAGNOSIS — I2510 Atherosclerotic heart disease of native coronary artery without angina pectoris: Secondary | ICD-10-CM | POA: Diagnosis not present

## 2015-10-16 DIAGNOSIS — Z794 Long term (current) use of insulin: Secondary | ICD-10-CM | POA: Diagnosis not present

## 2015-10-16 DIAGNOSIS — Z7982 Long term (current) use of aspirin: Secondary | ICD-10-CM | POA: Diagnosis not present

## 2015-10-16 DIAGNOSIS — E1140 Type 2 diabetes mellitus with diabetic neuropathy, unspecified: Secondary | ICD-10-CM | POA: Diagnosis not present

## 2015-10-16 DIAGNOSIS — L97521 Non-pressure chronic ulcer of other part of left foot limited to breakdown of skin: Secondary | ICD-10-CM | POA: Diagnosis not present

## 2015-10-16 DIAGNOSIS — Z7984 Long term (current) use of oral hypoglycemic drugs: Secondary | ICD-10-CM | POA: Diagnosis not present

## 2015-10-16 DIAGNOSIS — I10 Essential (primary) hypertension: Secondary | ICD-10-CM | POA: Diagnosis not present

## 2015-10-17 ENCOUNTER — Telehealth: Payer: Self-pay | Admitting: Cardiovascular Disease

## 2015-10-19 DIAGNOSIS — E11621 Type 2 diabetes mellitus with foot ulcer: Secondary | ICD-10-CM | POA: Diagnosis not present

## 2015-10-19 DIAGNOSIS — I10 Essential (primary) hypertension: Secondary | ICD-10-CM | POA: Diagnosis not present

## 2015-10-19 DIAGNOSIS — Z48812 Encounter for surgical aftercare following surgery on the circulatory system: Secondary | ICD-10-CM | POA: Diagnosis not present

## 2015-10-19 DIAGNOSIS — E1151 Type 2 diabetes mellitus with diabetic peripheral angiopathy without gangrene: Secondary | ICD-10-CM | POA: Diagnosis not present

## 2015-10-19 DIAGNOSIS — I2510 Atherosclerotic heart disease of native coronary artery without angina pectoris: Secondary | ICD-10-CM | POA: Diagnosis not present

## 2015-10-19 DIAGNOSIS — L97521 Non-pressure chronic ulcer of other part of left foot limited to breakdown of skin: Secondary | ICD-10-CM | POA: Diagnosis not present

## 2015-10-25 ENCOUNTER — Other Ambulatory Visit: Payer: Self-pay | Admitting: Cardiovascular Disease

## 2015-10-25 DIAGNOSIS — I10 Essential (primary) hypertension: Secondary | ICD-10-CM | POA: Diagnosis not present

## 2015-10-25 DIAGNOSIS — E1151 Type 2 diabetes mellitus with diabetic peripheral angiopathy without gangrene: Secondary | ICD-10-CM | POA: Diagnosis not present

## 2015-10-25 DIAGNOSIS — I779 Disorder of arteries and arterioles, unspecified: Secondary | ICD-10-CM

## 2015-10-25 DIAGNOSIS — Z48812 Encounter for surgical aftercare following surgery on the circulatory system: Secondary | ICD-10-CM | POA: Diagnosis not present

## 2015-10-25 DIAGNOSIS — E11621 Type 2 diabetes mellitus with foot ulcer: Secondary | ICD-10-CM | POA: Diagnosis not present

## 2015-10-25 DIAGNOSIS — I70229 Atherosclerosis of native arteries of extremities with rest pain, unspecified extremity: Secondary | ICD-10-CM

## 2015-10-25 DIAGNOSIS — I739 Peripheral vascular disease, unspecified: Secondary | ICD-10-CM

## 2015-10-25 DIAGNOSIS — I998 Other disorder of circulatory system: Secondary | ICD-10-CM

## 2015-10-25 DIAGNOSIS — I2510 Atherosclerotic heart disease of native coronary artery without angina pectoris: Secondary | ICD-10-CM | POA: Diagnosis not present

## 2015-10-25 DIAGNOSIS — L97521 Non-pressure chronic ulcer of other part of left foot limited to breakdown of skin: Secondary | ICD-10-CM | POA: Diagnosis not present

## 2015-10-26 ENCOUNTER — Ambulatory Visit (HOSPITAL_COMMUNITY)
Admission: RE | Admit: 2015-10-26 | Discharge: 2015-10-26 | Disposition: A | Discharge status: Home / Self Care | Payer: Medicare Other | Source: Ambulatory Visit | Attending: Cardiovascular Disease | Admitting: Cardiovascular Disease

## 2015-10-26 DIAGNOSIS — I739 Peripheral vascular disease, unspecified: Secondary | ICD-10-CM

## 2015-10-26 DIAGNOSIS — E1151 Type 2 diabetes mellitus with diabetic peripheral angiopathy without gangrene: Secondary | ICD-10-CM | POA: Diagnosis not present

## 2015-10-26 DIAGNOSIS — I6523 Occlusion and stenosis of bilateral carotid arteries: Secondary | ICD-10-CM | POA: Insufficient documentation

## 2015-10-26 DIAGNOSIS — I2510 Atherosclerotic heart disease of native coronary artery without angina pectoris: Secondary | ICD-10-CM | POA: Diagnosis not present

## 2015-10-26 DIAGNOSIS — Z87891 Personal history of nicotine dependence: Secondary | ICD-10-CM | POA: Diagnosis not present

## 2015-10-26 DIAGNOSIS — I779 Disorder of arteries and arterioles, unspecified: Secondary | ICD-10-CM

## 2015-10-26 DIAGNOSIS — E785 Hyperlipidemia, unspecified: Secondary | ICD-10-CM | POA: Insufficient documentation

## 2015-10-26 DIAGNOSIS — R931 Abnormal findings on diagnostic imaging of heart and coronary circulation: Secondary | ICD-10-CM | POA: Diagnosis not present

## 2015-10-26 DIAGNOSIS — R590 Localized enlarged lymph nodes: Secondary | ICD-10-CM | POA: Diagnosis not present

## 2015-10-27 DIAGNOSIS — I739 Peripheral vascular disease, unspecified: Secondary | ICD-10-CM | POA: Diagnosis not present

## 2015-10-29 DIAGNOSIS — A419 Sepsis, unspecified organism: Secondary | ICD-10-CM

## 2015-10-29 DIAGNOSIS — J9690 Respiratory failure, unspecified, unspecified whether with hypoxia or hypercapnia: Secondary | ICD-10-CM

## 2015-10-29 DIAGNOSIS — D649 Anemia, unspecified: Secondary | ICD-10-CM

## 2015-10-29 HISTORY — DX: Sepsis, unspecified organism: A41.9

## 2015-10-29 HISTORY — DX: Respiratory failure, unspecified, unspecified whether with hypoxia or hypercapnia: J96.90

## 2015-10-29 HISTORY — DX: Anemia, unspecified: D64.9

## 2015-10-30 ENCOUNTER — Inpatient Hospital Stay (HOSPITAL_COMMUNITY)
Admission: EM | Admit: 2015-10-30 | Discharge: 2015-11-14 | DRG: 628 | Disposition: A | Discharge status: Skilled Nursing Facility | Payer: Medicare Other | Attending: Internal Medicine | Admitting: Internal Medicine

## 2015-10-30 ENCOUNTER — Emergency Department (HOSPITAL_COMMUNITY): Payer: Medicare Other

## 2015-10-30 ENCOUNTER — Encounter (HOSPITAL_BASED_OUTPATIENT_CLINIC_OR_DEPARTMENT_OTHER): Payer: Medicare Other | Attending: Internal Medicine

## 2015-10-30 ENCOUNTER — Encounter (HOSPITAL_COMMUNITY): Payer: Self-pay | Admitting: Emergency Medicine

## 2015-10-30 DIAGNOSIS — R571 Hypovolemic shock: Secondary | ICD-10-CM | POA: Diagnosis not present

## 2015-10-30 DIAGNOSIS — E08621 Diabetes mellitus due to underlying condition with foot ulcer: Secondary | ICD-10-CM | POA: Diagnosis not present

## 2015-10-30 DIAGNOSIS — I739 Peripheral vascular disease, unspecified: Secondary | ICD-10-CM | POA: Diagnosis present

## 2015-10-30 DIAGNOSIS — I214 Non-ST elevation (NSTEMI) myocardial infarction: Secondary | ICD-10-CM | POA: Diagnosis not present

## 2015-10-30 DIAGNOSIS — J810 Acute pulmonary edema: Secondary | ICD-10-CM | POA: Diagnosis not present

## 2015-10-30 DIAGNOSIS — M79672 Pain in left foot: Secondary | ICD-10-CM

## 2015-10-30 DIAGNOSIS — L089 Local infection of the skin and subcutaneous tissue, unspecified: Secondary | ICD-10-CM | POA: Diagnosis not present

## 2015-10-30 DIAGNOSIS — A419 Sepsis, unspecified organism: Secondary | ICD-10-CM | POA: Diagnosis not present

## 2015-10-30 DIAGNOSIS — I5032 Chronic diastolic (congestive) heart failure: Secondary | ICD-10-CM

## 2015-10-30 DIAGNOSIS — I110 Hypertensive heart disease with heart failure: Secondary | ICD-10-CM | POA: Diagnosis present

## 2015-10-30 DIAGNOSIS — M6281 Muscle weakness (generalized): Secondary | ICD-10-CM | POA: Diagnosis not present

## 2015-10-30 DIAGNOSIS — Z01810 Encounter for preprocedural cardiovascular examination: Secondary | ICD-10-CM | POA: Diagnosis not present

## 2015-10-30 DIAGNOSIS — E1169 Type 2 diabetes mellitus with other specified complication: Principal | ICD-10-CM | POA: Diagnosis present

## 2015-10-30 DIAGNOSIS — Z90710 Acquired absence of both cervix and uterus: Secondary | ICD-10-CM

## 2015-10-30 DIAGNOSIS — E876 Hypokalemia: Secondary | ICD-10-CM | POA: Diagnosis not present

## 2015-10-30 DIAGNOSIS — Z419 Encounter for procedure for purposes other than remedying health state, unspecified: Secondary | ICD-10-CM

## 2015-10-30 DIAGNOSIS — Z95828 Presence of other vascular implants and grafts: Secondary | ICD-10-CM | POA: Diagnosis not present

## 2015-10-30 DIAGNOSIS — E440 Moderate protein-calorie malnutrition: Secondary | ICD-10-CM | POA: Diagnosis present

## 2015-10-30 DIAGNOSIS — I70245 Atherosclerosis of native arteries of left leg with ulceration of other part of foot: Secondary | ICD-10-CM | POA: Diagnosis not present

## 2015-10-30 DIAGNOSIS — R0902 Hypoxemia: Secondary | ICD-10-CM | POA: Diagnosis not present

## 2015-10-30 DIAGNOSIS — I639 Cerebral infarction, unspecified: Secondary | ICD-10-CM | POA: Diagnosis not present

## 2015-10-30 DIAGNOSIS — J9601 Acute respiratory failure with hypoxia: Secondary | ICD-10-CM | POA: Diagnosis present

## 2015-10-30 DIAGNOSIS — Y848 Other medical procedures as the cause of abnormal reaction of the patient, or of later complication, without mention of misadventure at the time of the procedure: Secondary | ICD-10-CM | POA: Diagnosis not present

## 2015-10-30 DIAGNOSIS — Z4682 Encounter for fitting and adjustment of non-vascular catheter: Secondary | ICD-10-CM | POA: Diagnosis not present

## 2015-10-30 DIAGNOSIS — Z833 Family history of diabetes mellitus: Secondary | ICD-10-CM

## 2015-10-30 DIAGNOSIS — T80219A Unspecified infection due to central venous catheter, initial encounter: Secondary | ICD-10-CM | POA: Diagnosis not present

## 2015-10-30 DIAGNOSIS — I998 Other disorder of circulatory system: Secondary | ICD-10-CM | POA: Diagnosis not present

## 2015-10-30 DIAGNOSIS — E11628 Type 2 diabetes mellitus with other skin complications: Secondary | ICD-10-CM | POA: Diagnosis present

## 2015-10-30 DIAGNOSIS — M868X7 Other osteomyelitis, ankle and foot: Secondary | ICD-10-CM | POA: Diagnosis not present

## 2015-10-30 DIAGNOSIS — I219 Acute myocardial infarction, unspecified: Secondary | ICD-10-CM | POA: Diagnosis not present

## 2015-10-30 DIAGNOSIS — E11621 Type 2 diabetes mellitus with foot ulcer: Secondary | ICD-10-CM | POA: Diagnosis not present

## 2015-10-30 DIAGNOSIS — Z951 Presence of aortocoronary bypass graft: Secondary | ICD-10-CM | POA: Diagnosis not present

## 2015-10-30 DIAGNOSIS — D62 Acute posthemorrhagic anemia: Secondary | ICD-10-CM | POA: Diagnosis not present

## 2015-10-30 DIAGNOSIS — S91302A Unspecified open wound, left foot, initial encounter: Secondary | ICD-10-CM | POA: Diagnosis not present

## 2015-10-30 DIAGNOSIS — Z823 Family history of stroke: Secondary | ICD-10-CM

## 2015-10-30 DIAGNOSIS — E1142 Type 2 diabetes mellitus with diabetic polyneuropathy: Secondary | ICD-10-CM | POA: Diagnosis present

## 2015-10-30 DIAGNOSIS — E1122 Type 2 diabetes mellitus with diabetic chronic kidney disease: Secondary | ICD-10-CM | POA: Diagnosis not present

## 2015-10-30 DIAGNOSIS — R2681 Unsteadiness on feet: Secondary | ICD-10-CM | POA: Diagnosis not present

## 2015-10-30 DIAGNOSIS — E1165 Type 2 diabetes mellitus with hyperglycemia: Secondary | ICD-10-CM | POA: Diagnosis present

## 2015-10-30 DIAGNOSIS — L97425 Non-pressure chronic ulcer of left heel and midfoot with muscle involvement without evidence of necrosis: Secondary | ICD-10-CM

## 2015-10-30 DIAGNOSIS — K5900 Constipation, unspecified: Secondary | ICD-10-CM | POA: Diagnosis present

## 2015-10-30 DIAGNOSIS — E1151 Type 2 diabetes mellitus with diabetic peripheral angiopathy without gangrene: Secondary | ICD-10-CM | POA: Diagnosis present

## 2015-10-30 DIAGNOSIS — E118 Type 2 diabetes mellitus with unspecified complications: Secondary | ICD-10-CM

## 2015-10-30 DIAGNOSIS — L97423 Non-pressure chronic ulcer of left heel and midfoot with necrosis of muscle: Secondary | ICD-10-CM | POA: Diagnosis not present

## 2015-10-30 DIAGNOSIS — I2510 Atherosclerotic heart disease of native coronary artery without angina pectoris: Secondary | ICD-10-CM | POA: Diagnosis present

## 2015-10-30 DIAGNOSIS — Z6827 Body mass index (BMI) 27.0-27.9, adult: Secondary | ICD-10-CM

## 2015-10-30 DIAGNOSIS — Z8249 Family history of ischemic heart disease and other diseases of the circulatory system: Secondary | ICD-10-CM

## 2015-10-30 DIAGNOSIS — L97509 Non-pressure chronic ulcer of other part of unspecified foot with unspecified severity: Secondary | ICD-10-CM

## 2015-10-30 DIAGNOSIS — I10 Essential (primary) hypertension: Secondary | ICD-10-CM | POA: Diagnosis not present

## 2015-10-30 DIAGNOSIS — Z4659 Encounter for fitting and adjustment of other gastrointestinal appliance and device: Secondary | ICD-10-CM

## 2015-10-30 DIAGNOSIS — G9340 Encephalopathy, unspecified: Secondary | ICD-10-CM | POA: Diagnosis present

## 2015-10-30 DIAGNOSIS — G8194 Hemiplegia, unspecified affecting left nondominant side: Secondary | ICD-10-CM | POA: Diagnosis present

## 2015-10-30 DIAGNOSIS — T794XXA Traumatic shock, initial encounter: Secondary | ICD-10-CM | POA: Diagnosis not present

## 2015-10-30 DIAGNOSIS — Z794 Long term (current) use of insulin: Secondary | ICD-10-CM | POA: Diagnosis not present

## 2015-10-30 DIAGNOSIS — I252 Old myocardial infarction: Secondary | ICD-10-CM

## 2015-10-30 DIAGNOSIS — R6521 Severe sepsis with septic shock: Secondary | ICD-10-CM | POA: Diagnosis not present

## 2015-10-30 DIAGNOSIS — Z01818 Encounter for other preprocedural examination: Secondary | ICD-10-CM

## 2015-10-30 DIAGNOSIS — Z87891 Personal history of nicotine dependence: Secondary | ICD-10-CM

## 2015-10-30 DIAGNOSIS — R579 Shock, unspecified: Secondary | ICD-10-CM | POA: Diagnosis not present

## 2015-10-30 DIAGNOSIS — L02612 Cutaneous abscess of left foot: Secondary | ICD-10-CM | POA: Diagnosis not present

## 2015-10-30 DIAGNOSIS — E785 Hyperlipidemia, unspecified: Secondary | ICD-10-CM | POA: Diagnosis not present

## 2015-10-30 DIAGNOSIS — B951 Streptococcus, group B, as the cause of diseases classified elsewhere: Secondary | ICD-10-CM | POA: Diagnosis not present

## 2015-10-30 DIAGNOSIS — K219 Gastro-esophageal reflux disease without esophagitis: Secondary | ICD-10-CM | POA: Diagnosis present

## 2015-10-30 DIAGNOSIS — M869 Osteomyelitis, unspecified: Secondary | ICD-10-CM | POA: Diagnosis not present

## 2015-10-30 DIAGNOSIS — Z7902 Long term (current) use of antithrombotics/antiplatelets: Secondary | ICD-10-CM

## 2015-10-30 DIAGNOSIS — M86172 Other acute osteomyelitis, left ankle and foot: Secondary | ICD-10-CM | POA: Diagnosis not present

## 2015-10-30 DIAGNOSIS — Z9861 Coronary angioplasty status: Secondary | ICD-10-CM

## 2015-10-30 DIAGNOSIS — Z7982 Long term (current) use of aspirin: Secondary | ICD-10-CM

## 2015-10-30 DIAGNOSIS — IMO0002 Reserved for concepts with insufficient information to code with codable children: Secondary | ICD-10-CM | POA: Diagnosis present

## 2015-10-30 DIAGNOSIS — I5022 Chronic systolic (congestive) heart failure: Secondary | ICD-10-CM | POA: Diagnosis not present

## 2015-10-30 DIAGNOSIS — R6520 Severe sepsis without septic shock: Secondary | ICD-10-CM | POA: Diagnosis not present

## 2015-10-30 DIAGNOSIS — L97529 Non-pressure chronic ulcer of other part of left foot with unspecified severity: Secondary | ICD-10-CM | POA: Diagnosis not present

## 2015-10-30 DIAGNOSIS — Z452 Encounter for adjustment and management of vascular access device: Secondary | ICD-10-CM | POA: Diagnosis not present

## 2015-10-30 DIAGNOSIS — I70202 Unspecified atherosclerosis of native arteries of extremities, left leg: Secondary | ICD-10-CM | POA: Diagnosis present

## 2015-10-30 DIAGNOSIS — R064 Hyperventilation: Secondary | ICD-10-CM

## 2015-10-30 DIAGNOSIS — E0851 Diabetes mellitus due to underlying condition with diabetic peripheral angiopathy without gangrene: Secondary | ICD-10-CM | POA: Diagnosis not present

## 2015-10-30 DIAGNOSIS — L89150 Pressure ulcer of sacral region, unstageable: Secondary | ICD-10-CM | POA: Diagnosis not present

## 2015-10-30 DIAGNOSIS — M86272 Subacute osteomyelitis, left ankle and foot: Secondary | ICD-10-CM | POA: Diagnosis not present

## 2015-10-30 DIAGNOSIS — Z9119 Patient's noncompliance with other medical treatment and regimen: Secondary | ICD-10-CM

## 2015-10-30 DIAGNOSIS — I6523 Occlusion and stenosis of bilateral carotid arteries: Secondary | ICD-10-CM | POA: Diagnosis not present

## 2015-10-30 DIAGNOSIS — Z4789 Encounter for other orthopedic aftercare: Secondary | ICD-10-CM | POA: Diagnosis not present

## 2015-10-30 DIAGNOSIS — R0602 Shortness of breath: Secondary | ICD-10-CM | POA: Diagnosis not present

## 2015-10-30 DIAGNOSIS — B954 Other streptococcus as the cause of diseases classified elsewhere: Secondary | ICD-10-CM | POA: Diagnosis not present

## 2015-10-30 LAB — CBC WITH DIFFERENTIAL/PLATELET
Basophils Absolute: 0 10*3/uL (ref 0.0–0.1)
Basophils Relative: 0 %
Eosinophils Absolute: 0.2 10*3/uL (ref 0.0–0.7)
Eosinophils Relative: 1 %
HCT: 31.2 % — ABNORMAL LOW (ref 36.0–46.0)
Hemoglobin: 10.5 g/dL — ABNORMAL LOW (ref 12.0–15.0)
Lymphocytes Relative: 17 %
Lymphs Abs: 2.2 10*3/uL (ref 0.7–4.0)
MCH: 28.6 pg (ref 26.0–34.0)
MCHC: 33.7 g/dL (ref 30.0–36.0)
MCV: 85 fL (ref 78.0–100.0)
Monocytes Absolute: 0.9 10*3/uL (ref 0.1–1.0)
Monocytes Relative: 7 %
Neutro Abs: 10 10*3/uL — ABNORMAL HIGH (ref 1.7–7.7)
Neutrophils Relative %: 75 %
Platelets: 445 10*3/uL — ABNORMAL HIGH (ref 150–400)
RBC: 3.67 MIL/uL — ABNORMAL LOW (ref 3.87–5.11)
RDW: 14.3 % (ref 11.5–15.5)
WBC: 13.3 10*3/uL — ABNORMAL HIGH (ref 4.0–10.5)

## 2015-10-30 LAB — CBC AND DIFFERENTIAL
HEMATOCRIT: 31 % — AB (ref 36–46)
HEMOGLOBIN: 10.5 g/dL — AB (ref 12.0–16.0)
Platelets: 445 10*3/uL — AB (ref 150–399)
WBC: 13.3 10^3/mL

## 2015-10-30 LAB — BASIC METABOLIC PANEL
Anion gap: 8 (ref 5–15)
BUN: 10 mg/dL (ref 4–21)
BUN: 10 mg/dL (ref 6–20)
CO2: 26 mmol/L (ref 22–32)
Calcium: 8.6 mg/dL — ABNORMAL LOW (ref 8.9–10.3)
Chloride: 103 mmol/L (ref 101–111)
Creatinine, Ser: 0.56 mg/dL (ref 0.44–1.00)
Creatinine: 0.6 mg/dL (ref 0.5–1.1)
GFR calc Af Amer: >60 mL/min (ref >60)
GFR calc non Af Amer: >60 mL/min (ref >60)
Glucose, Bld: 172 mg/dL — ABNORMAL HIGH (ref 65–99)
Glucose: 172 mg/dL
POTASSIUM: 3.7 mmol/L (ref 3.4–5.3)
Potassium: 3.7 mmol/L (ref 3.5–5.1)
SODIUM: 137 mmol/L (ref 137–147)
Sodium: 137 mmol/L (ref 135–145)

## 2015-10-30 MED ORDER — OXYCODONE-ACETAMINOPHEN 5-325 MG PO TABS
1.0000 | ORAL_TABLET | Freq: Once | ORAL | Status: AC
  Administered 2015-10-30: 1 via ORAL
  Filled 2015-10-30: qty 1

## 2015-10-30 MED ORDER — LEVOFLOXACIN IN D5W 750 MG/150ML IV SOLN
750.0000 mg | Freq: Once | INTRAVENOUS | Status: AC
  Administered 2015-10-30: 750 mg via INTRAVENOUS
  Filled 2015-10-30: qty 150

## 2015-10-30 MED ORDER — VANCOMYCIN HCL 10 G IV SOLR
1500.0000 mg | Freq: Once | INTRAVENOUS | Status: AC
  Administered 2015-10-30: 1500 mg via INTRAVENOUS
  Filled 2015-10-30: qty 1500

## 2015-10-30 MED ORDER — OXYCODONE-ACETAMINOPHEN 5-325 MG PO TABS
1.0000 | ORAL_TABLET | Freq: Four times a day (QID) | ORAL | Status: DC | PRN
  Administered 2015-10-31: 1 via ORAL
  Administered 2015-10-31: 2 via ORAL
  Administered 2015-10-31: 1 via ORAL
  Administered 2015-11-01 – 2015-11-02 (×5): 2 via ORAL
  Filled 2015-10-30: qty 1
  Filled 2015-10-30: qty 2
  Filled 2015-10-30: qty 1
  Filled 2015-10-30 (×5): qty 2

## 2015-10-30 MED ORDER — MORPHINE SULFATE (PF) 4 MG/ML IV SOLN
4.0000 mg | Freq: Once | INTRAVENOUS | Status: AC
Start: 2015-10-30 — End: 2015-10-30
  Administered 2015-10-30: 4 mg via INTRAVENOUS
  Filled 2015-10-30: qty 1

## 2015-10-30 NOTE — ED Triage Notes (Signed)
[facility] infection needing IV antibiotics.

## 2015-10-30 NOTE — H&P (Addendum)
History and Physical    [name] [name] WUJ:[id number] DOB: 1950-06-12 DOA: 10/30/2015  PCP: Georgann Housekeeper, MD Consultants: Allyson Sabal - cardiology; Dixon - surgery Patient coming from: home - lives with daughter, Leotis Shames  Chief Complaint: foot infection  HPI: [name] [name] is a 65 y.o. female with medical history significant of DM, PVD with recent revascularization, MI in 2011, HTN, HLD, and peripheral neuropathy presenting with a diabetic foot infection.  Patient is diabetic and developed a wound on the side of her foot.  Went to the doctor, [facility]).  2 weeks ago, developed a blister on the side of her foot and it burst.  Didn't have transportation to the doctor and so missed several appointments.  When she made it there today, they were concerned about these ulcers and sent her to the ER for further evaluation.  Diabetes for about 14 years.  On insulin. Sugars run a little high, 200 range.  Prior A1c was 13, but down to 7 at last check recently.  Used to hceck glucose twice daily but not recently.  She was admitted from 8/31-09/29/15 for PAD and PVD with claudication; she had stenting of the distal left common femoral artery during that hospitalization.  She had previously had a staged left common femoral endarterectomy with profundoplasty on 09/05/15.  ED Course: Given Vanc 1500 mg, Levaquin 750 mg, morphine 4 mg  Review of Systems: As per HPI; otherwise 10 point review of systems reviewed and negative.   Ambulatory Status:  Ambulated without difficulty, other than occasional use of walker due to neuropathy  Past Medical History:  Diagnosis Date  . Arthritis    "feel like I have it all over" (08/28/2015)  . Complication of anesthesia    DIFFICULT WAKING "only when I was smoking; no problems since I quit"  . Coronary artery disease   . Family history of adverse reaction to anesthesia    sister slow to wake up  . GERD (gastroesophageal reflux disease)      takes Protonix daily   . Hip bursitis   . Hyperlipidemia LDL goal < 70 06/28/2013   takes Atorvastatin daily  . Hypertension    takes Metoprolol and Imdur daily  . Migraine    "none in a long time" (08/28/2015)  . Myocardial infarction 2011  . Neuromuscular disorder (HCC)    DIABETIC NEUROPATHY  . PAD (peripheral artery disease) (HCC)   . Peripheral vascular disease (HCC)   . Type II diabetes mellitus (HCC)    takes Lantus nightly.Average fasting blood sugar runs 80-90    Past Surgical History:  Procedure Laterality Date  . ABDOMINAL HYSTERECTOMY    . APPENDECTOMY    . ATHERECTOMY N/A 06/04/2011   Procedure: ATHERECTOMY;  Surgeon: Runell Gess, MD;  Location: [facility] CATH LAB;  Service: Cardiovascular;  Laterality: N/A;  . CARDIAC CATHETERIZATION  10/13/2009   95% stenosis in the AV groove circumflex and 95% ostial stenosis in small OM3. A 3x87mm drug-eluting Promus stent inserted ito the circumflex. Dilatated with a 3.25x50mm noncompliant Quantum balloon within entire segment. The entire region was reduced to 0% and brisk TIMI3 flow.  . CAROTID DUPLEX  03/19/2011   Right ICA-demonstrates complete occlusion. Left ICA-demonstrates a small amount of fibrous plaque.  Marland Kitchen CATARACT EXTRACTION W/ INTRAOCULAR LENS IMPLANT Right   . CESAREAN SECTION  1990  . CORONARY ANGIOPLASTY    . ENDARTERECTOMY FEMORAL Left 09/05/2015   Procedure: ENDARTERECTOMY FEMORAL WITH PROFUNDOPLASTY;  Surgeon: Chuck Hint, MD;  Location: [facility] OR;  Service: Vascular;  Laterality: Left;  Left common femoral artery vein patch using left saphenous vien  . ILIAC ARTERY STENT Left 08/28/2015   common  . INTRAOPERATIVE ARTERIOGRAM Left 09/05/2015   Procedure: INTRA OPERATIVE ARTERIOGRAM;  Surgeon: Chuck Hint, MD;  Location: [facility] OR;  Service: Vascular;  Laterality: Left;  . LEXISCAN MYOVIEW  10/25/2010   Moderate perfusion defect due to infarct/scar with mild perinfarct ischemia seen in the Basal  Inferolateral, Basal Anterolateral, Mid Inferolateral, and Mid Anterolateral regions. Post-stress EF is 50%.  . OVARY SURGERY  1983?   "ruptured"  . PERIPHERAL VASCULAR ANGIOGRAM  01/26/2010   High-grade SFA disease: left greater than right. Left SFA would require fem-pop bypass grafting. Right SFA could be stented but might require Diamondback Orbital atherectomy.  Marland Kitchen PERIPHERAL VASCULAR ANGIOGRAM  02/23/2010   Stealth Predator orbital rotational atherectomy performed on SFA & Popliteal up to 90,000 RPM. Stenting using overlapping 5x167mm and 5x61mm Absolute Pro Nitinol self-expanding stents beginning just at the knee up to the mid SFA resulting in reduction of 90-95% calcified SFA & Popliteal stenosis to 0. Stenting performed on the distal common & proximal iliac artery with a 10x4 Absolute Pro- 70-0%.  Marland Kitchen PERIPHERAL VASCULAR ANGIOGRAM  06/17/2010   PTA performed to the right external iliac artery stent using a 5x100 balloon at 10 atmospheres. Stenting performed using a 6x18 Genesis on Opta balloon. Postdilatation with a 7x2 balloon resulting in a 95% "in-stent" stenosis to 0% residual.  . PERIPHERAL VASCULAR ANGIOGRAM  06/04/2011   Bilateral total SFAs not percutaneously addressable. Good canidate for femoropopliteal bypass grafting  . PERIPHERAL VASCULAR ANGIOGRAM  08/28/2015  . PERIPHERAL VASCULAR CATHETERIZATION N/A 08/28/2015   Procedure: Lower Extremity Angiography;  Surgeon: Runell Gess, MD;  Location: [facility] INVASIVE CV LAB;  Service: Cardiovascular;  Laterality: N/A;  . PERIPHERAL VASCULAR CATHETERIZATION N/A 08/28/2015   Procedure: Abdominal Aortogram;  Surgeon: Runell Gess, MD;  Location: [facility] INVASIVE CV LAB;  Service: Cardiovascular;  Laterality: N/A;  . PERIPHERAL VASCULAR CATHETERIZATION Left 08/28/2015   Procedure: Peripheral Vascular Intervention;  Surgeon: Runell Gess, MD;  Location: [facility] INVASIVE CV LAB;  Service: Cardiovascular;  Laterality: Left;  common iliac  . PERIPHERAL  VASCULAR CATHETERIZATION Left 08/28/2015   Procedure: Peripheral Vascular Atherectomy;  Surgeon: Runell Gess, MD;  Location: [facility] INVASIVE CV LAB;  Service: Cardiovascular;  Laterality: Left;  common iliac  . PERIPHERAL VASCULAR CATHETERIZATION N/A 09/28/2015   Procedure: Lower Extremity Angiography;  Surgeon: Runell Gess, MD;  Location: [facility] INVASIVE CV LAB;  Service: Cardiovascular;  Laterality: N/A;  . PERIPHERAL VASCULAR CATHETERIZATION Left 09/28/2015   Procedure: Peripheral Vascular Intervention;  Surgeon: Runell Gess, MD;  Location: [facility] INVASIVE CV LAB;  Service: Cardiovascular;  Laterality: Left CFA  PCI with 9 mm x 4 cm Abbott nitinol absolute Pro self-expanding stent     . TRANSTHORACIC ECHOCARDIOGRAM  10/17/2009   EF 45-50%, moderate hypokinesis of the entire inferolateral myocardium, mild concentric hypertrophy and mild regurg of the mitral valva.    Social History   Social History  . Marital status: Married    Spouse name: N/A  . Number of children: N/A  . Years of education: N/A   Occupational History  . retired    Social History Main Topics  . Smoking status: Former Smoker    Packs/day: 1.50    Years: 41.00    Types: Cigarettes    Quit date:  10/12/2009  . Smokeless tobacco: Never Used  . Alcohol use No  . Drug use: No  . Sexual activity: Not Currently    Birth control/ protection: Surgical   Other Topics Concern  . Not on file   Social History Narrative  . No narrative on file    Allergies  Allergen Reactions  . Hydrochlorothiazide Other (See Comments)    Unknown  . Latex Rash  . Penicillins Swelling, Rash and Other (See Comments)    Has patient had a PCN reaction causing immediate rash, facial/tongue/throat swelling, SOB or lightheadedness with hypotension: Yes Has patient had a PCN reaction causing severe rash involving mucus membranes or skin necrosis: No Has patient had a PCN reaction that required hospitalization No Has patient had a PCN  reaction occurring within the last 10 years: No If all of the above answers are "NO", then may proceed with Cephalosporin use.     Family History  Problem Relation Age of Onset  . Hypertension Mother   . Heart failure Mother   . Heart failure Father   . Stroke Father   . Diabetes Father     Prior to Admission medications   Medication Sig Start Date End Date Taking? Authorizing Provider  amLODipine (NORVASC) 5 MG tablet TAKE 1 TABLET(5 MG) BY MOUTH DAILY 03/28/15  Yes Runell GessJonathan J Berry, MD  aspirin EC 81 MG tablet Take 81 mg by mouth daily.   Yes Historical Provider, MD  atorvastatin (LIPITOR) 80 MG tablet Take 1 tablet (80 mg total) by mouth daily. 06/30/15  Yes Lennette Biharihomas A Kelly, MD  clopidogrel (PLAVIX) 75 MG tablet Take 1 tablet (75 mg total) by mouth daily. Holding for vascular surgery, restart as soon as possible after leg surgery (after 8/8) 08/29/15  Yes Azalee CourseHao Meng, PA  gabapentin (NEURONTIN) 300 MG capsule Take 300 mg by mouth 3 (three) times daily.  10/10/15  Yes Historical Provider, MD  isosorbide mononitrate (IMDUR) 60 MG 24 hr tablet Take 90 mg by mouth daily.    Yes Historical Provider, MD  ketoconazole (NIZORAL) 2 % cream Apply 1 application topically daily.  10/20/15  Yes Historical Provider, MD  losartan (COZAAR) 100 MG tablet TAKE 1 TABLET BY MOUTH EVERY DAY 10/31/14  Yes Lennette Biharihomas A Kelly, MD  metFORMIN (GLUCOPHAGE) 1000 MG tablet Take 1,000 mg by mouth 2 (two) times daily with a meal.   Yes Historical Provider, MD  metoprolol succinate (TOPROL-XL) 50 MG 24 hr tablet Take 1 tablet (50 mg total) by mouth daily. 09/11/15  Yes Lennette Biharihomas A Kelly, MD  oxyCODONE-acetaminophen (PERCOCET/ROXICET) 5-325 MG tablet Take 1-2 tablets by mouth every 6 (six) hours as needed for moderate pain. 09/07/15  Yes Samantha J Rhyne, PA-C  pantoprazole (PROTONIX) 40 MG tablet Take 1 tablet (40 mg total) by mouth daily. 03/20/15  Yes Peter M SwazilandJordan, MD  SANTYL ointment APP TOPICALLY UTD 10/05/15  Yes Historical Provider,  MD  ciprofloxacin (CIPRO) 500 MG tablet Take 1 tablet (500 mg total) by mouth 2 (two) times daily. Patient not taking: Reported on 10/30/2015 09/07/15   Lelon MastSamantha J Rhyne, PA-C  Insulin Glargine (LANTUS) 100 UNIT/ML Solostar Pen Inject 35 Units into the skin daily at 10 pm.  03/04/13   Catarina Hartshornavid Tat, MD  OVER THE COUNTER MEDICATION Apply 1 application topically daily. [place]    Historical Provider, MD    Physical Exam: Vitals:   10/30/15 1931 10/30/15 2104 10/30/15 2138 10/30/15 2340  BP: 190/70 182/81 181/73 172/72  Pulse: 96  (!)  54 100  Resp: 16  16   Temp:      SpO2: 100%  100% 100%  Weight:      Height:         General: Appears calm and comfortable and is NAD Eyes:  PERRL, EOMI, normal lids, iris ENT:  grossly normal hearing, lips & tongue, mmm Neck:  no LAD, masses or thyromegaly Cardiovascular:  RRR, no m/r/g. +left LE edema.  Respiratory:  CTA bilaterally, no w/r/r. Normal respiratory effort. Abdomen:  soft, ntnd, NABS Skin:  LLE with mid-foot erythema and 2 ulcers on the plantar surface of the midfoot with purulent drainage.  1 ulcer is about 2 cm and appears to have a deep tract and the other is more superficial and about 1 cm.  There is an additional ulcer that appears to be chronic in nature along the left lateral foot. Musculoskeletal:  grossly normal tone BUE/BLE, good ROM, no bony abnormality Psychiatric:  grossly normal mood and affect, speech fluent and appropriate, AOx3 Neurologic:  CN 2-12 grossly intact, moves all extremities in coordinated fashion, sensation intact  Labs on Admission: I have personally reviewed following labs and imaging studies  CBC:  Recent Labs Lab 10/30/15 1930  WBC 13.3*  NEUTROABS 10.0*  HGB 10.5*  HCT 31.2*  MCV 85.0  PLT 445*   Basic Metabolic Panel:  Recent Labs Lab 10/30/15 1930  NA 137  K 3.7  CL 103  CO2 26  GLUCOSE 172*  BUN 10  CREATININE 0.56  CALCIUM 8.6*   GFR: Estimated Creatinine Clearance: 68.5 mL/min (by  C-G formula based on SCr of 0.56 mg/dL). Liver Function Tests: No results for input(s): AST, ALT, ALKPHOS, BILITOT, PROT, ALBUMIN in the last 168 hours. No results for input(s): LIPASE, AMYLASE in the last 168 hours. No results for input(s): AMMONIA in the last 168 hours. Coagulation Profile: No results for input(s): INR, PROTIME in the last 168 hours. Cardiac Enzymes: No results for input(s): CKTOTAL, CKMB, CKMBINDEX, TROPONINI in the last 168 hours. BNP (last 3 results) No results for input(s): PROBNP in the last 8760 hours. HbA1C: No results for input(s): HGBA1C in the last 72 hours. CBG: No results for input(s): GLUCAP in the last 168 hours. Lipid Profile: No results for input(s): CHOL, HDL, LDLCALC, TRIG, CHOLHDL, LDLDIRECT in the last 72 hours. Thyroid Function Tests: No results for input(s): TSH, T4TOTAL, FREET4, T3FREE, THYROIDAB in the last 72 hours. Anemia Panel: No results for input(s): VITAMINB12, FOLATE, FERRITIN, TIBC, IRON, RETICCTPCT in the last 72 hours. Urine analysis:    Component Value Date/Time   COLORURINE YELLOW 09/05/2015 0641   APPEARANCEUR CLEAR 09/05/2015 0641   LABSPEC 1.013 09/05/2015 0641   PHURINE 6.0 09/05/2015 0641   GLUCOSEU 100 (A) 09/05/2015 0641   HGBUR TRACE (A) 09/05/2015 0641   BILIRUBINUR NEGATIVE 09/05/2015 0641   KETONESUR NEGATIVE 09/05/2015 0641   PROTEINUR 100 (A) 09/05/2015 0641   UROBILINOGEN 1.0 10/16/2009 0243   NITRITE NEGATIVE 09/05/2015 0641   LEUKOCYTESUR NEGATIVE 09/05/2015 0641    Creatinine Clearance: Estimated Creatinine Clearance: 68.5 mL/min (by C-G formula based on SCr of 0.56 mg/dL).  Sepsis Labs: @LABRCNTIP (procalcitonin:4,lacticidven:4) )No results found for this or any previous visit (from the past 240 hour(s)).   Radiological Exams on Admission: Dg Foot Complete Left  Result Date: 10/30/2015 CLINICAL DATA:  Dorsal foot pain. Skin lesion with possible wound infection. EXAM: LEFT FOOT - COMPLETE 3+ VIEW  COMPARISON:  09/19/2015 FINDINGS: There is definite destructive osteomyelitis of the fifth  toe affecting the distal metatarsal an the proximal phalanx. There is probable osteomyelitis of the fourth toe affecting the distal metatarsal an the proximal phalanx. There is questionable osteomyelitis of the proximal phalanx of the third toe. IMPRESSION: Osteomyelitis of the forefoot, definite affecting the fifth toe, probable affecting the fourth toe and possible affecting the third toe. See above. Electronically Signed   By: Paulina Fusi M.D.   On: 10/30/2015 20:05    Carotid US 10/26/15: Heterogeneous plaque, bilaterally. Known chronic occlusion in the RICA. Stable 1-39% LICA stenosis. >50% bilateral ECA stenosis. Normal subclavian arteries, bilaterally. Patent vertebral arteries with antegrade flow.  Vascular LE [place] with ABIs on 10/26/15: Bilateral ABI's have increased compared to prior exam and remain in the severe range of flow reduction. No flow in the right great toe. Left great toe-brachial index is abnormal.  EKG: Not done  Assessment/Plan Principal Problem:   Osteomyelitis (HCC) Active Problems:   Diabetes mellitus type II, uncontrolled (HCC)   Hyperlipidemia with target LDL less than 70   PVD (peripheral vascular disease) with claudication (HCC)   Essential hypertension   Osteomyelitis of left foot including forefoot and possibly toes 3-5 -Patient with reported h/o foot infection with known PVD but multiple missed appointments to wound care center since revascularization procedures in Aug 2017 -Antibiotic coverage for osteo with Aztreonam and Vancomycin for now (based on potentially serious PCN allergy) -Suggest consult to Dr. Lajoyce Corners in AM - -Suggest vascular consultation in AM, as well; although she may not really be a candidate for additional revascularization procedures, it may be worth consideration -Continue Plavix for PVD  Diabetes -Acknowledges h/o poor control but better  control recently -Continue Lantus but hold Metformin -Cover with SSI  HLD -Continue Lipitor 80mg   HTN -Continue Norvasc, Cozaar, Toprol  Other -Patient reviewed in the Controlled Substances Registry.  She had 3 prescriptions for short-term narcotics by different providers in August, but does not appear to be taking long-term narcotics.   -Would suggest caution in prescribing narcotics to this patient based on non-compliance with appointments.  DVT prophylaxis: Lovenox  Code Status: Full - confirmed with patient/family Family Communication: Family members including daughter present throughout evaluation  Disposition Plan:  Home once clinically improved Consults called: Suggest Dr. Lajoyce Corners in AM  Admission status: Admit to med surg at [facility]   Jonah Blue MD [facility]  If 7PM-7AM, please contact night-coverage [email or web address] Password [email or web address]  10/31/2015, 12:36 AM

## 2015-10-30 NOTE — Progress Notes (Signed)
EDCM psoke to patient at bedside.  Patient reports she lives with her daughter Leotis Shames [phone]. Patient reports she needs assistance with her ADL's.   Patient reports she has a walker, cane, bedside commode and shower chair at home. Patient confirms her pcp is Dr. Donette Larry. Admitting MD now at bedside. EDCM did provide patient's daughter with list of home health agencies in [facility].

## 2015-10-30 NOTE — ED Provider Notes (Signed)
   Face-to-face evaluation   History: Presents for evaluation of left foot pain with swelling. History of left leg neuropathy.  Physical exam: Alert, elderly female. Left foot, tender and swollen. No proximal streaking or tenderness.  Medical screening examination/treatment/procedure(s) were conducted as a shared visit with non-physician practitioner(s) and myself.  I personally evaluated the patient during the encounter   Mancel Bale, MD 10/31/15 2124

## 2015-10-30 NOTE — ED Notes (Signed)
Family at bedside. Social worker at bedside.

## 2015-10-31 ENCOUNTER — Encounter (HOSPITAL_COMMUNITY): Payer: Self-pay | Admitting: *Deleted

## 2015-10-31 DIAGNOSIS — I110 Hypertensive heart disease with heart failure: Secondary | ICD-10-CM | POA: Diagnosis present

## 2015-10-31 DIAGNOSIS — M869 Osteomyelitis, unspecified: Secondary | ICD-10-CM

## 2015-10-31 DIAGNOSIS — I5032 Chronic diastolic (congestive) heart failure: Secondary | ICD-10-CM

## 2015-10-31 LAB — GLUCOSE, CAPILLARY
GLUCOSE-CAPILLARY: 232 mg/dL — AB (ref 65–99)
GLUCOSE-CAPILLARY: 231 mg/dL — AB (ref 65–99)
GLUCOSE-CAPILLARY: 217 mg/dL — AB (ref 65–99)
Glucose-Capillary: 163 mg/dL — ABNORMAL HIGH (ref 65–99)
Glucose-Capillary: 167 mg/dL — ABNORMAL HIGH (ref 65–99)

## 2015-10-31 LAB — CBC
HEMATOCRIT: 30.1 % — AB (ref 36.0–46.0)
Hemoglobin: 9.7 g/dL — ABNORMAL LOW (ref 12.0–15.0)
MCH: 27.5 pg (ref 26.0–34.0)
MCHC: 32.2 g/dL (ref 30.0–36.0)
MCV: 85.3 fL (ref 78.0–100.0)
Platelets: 364 10*3/uL (ref 150–400)
RBC: 3.53 MIL/uL — ABNORMAL LOW (ref 3.87–5.11)
RDW: 14.3 % (ref 11.5–15.5)
WBC: 10.1 10*3/uL (ref 4.0–10.5)

## 2015-10-31 LAB — BASIC METABOLIC PANEL
Anion gap: 7 (ref 5–15)
BUN: 6 mg/dL (ref 6–20)
CALCIUM: 8.7 mg/dL — AB (ref 8.9–10.3)
CO2: 28 mmol/L (ref 22–32)
Chloride: 104 mmol/L (ref 101–111)
Creatinine, Ser: 0.57 mg/dL (ref 0.44–1.00)
GFR calc Af Amer: >60 mL/min (ref >60)
GLUCOSE: 149 mg/dL — AB (ref 65–99)
Potassium: 4.1 mmol/L (ref 3.5–5.1)
Sodium: 139 mmol/L (ref 135–145)

## 2015-10-31 LAB — SURGICAL PCR SCREEN
MRSA, PCR: NEGATIVE
Staphylococcus aureus: NEGATIVE

## 2015-10-31 MED ORDER — VANCOMYCIN HCL IN DEXTROSE 750-5 MG/150ML-% IV SOLN
750.0000 mg | Freq: Two times a day (BID) | INTRAVENOUS | Status: DC
  Administered 2015-10-31 – 2015-11-02 (×6): 750 mg via INTRAVENOUS
  Filled 2015-10-31 (×7): qty 150

## 2015-10-31 MED ORDER — ATORVASTATIN CALCIUM 80 MG PO TABS
80.0000 mg | ORAL_TABLET | Freq: Every day | ORAL | Status: DC
  Administered 2015-10-31 – 2015-11-14 (×13): 80 mg via ORAL
  Filled 2015-10-31 (×13): qty 1

## 2015-10-31 MED ORDER — ISOSORBIDE MONONITRATE ER 60 MG PO TB24
90.0000 mg | ORAL_TABLET | Freq: Every day | ORAL | Status: DC
  Administered 2015-10-31 – 2015-11-03 (×3): 90 mg via ORAL
  Filled 2015-10-31 (×3): qty 1

## 2015-10-31 MED ORDER — ONDANSETRON HCL 4 MG/2ML IJ SOLN: 4.0000 mg | Freq: Four times a day (QID) | INTRAMUSCULAR | Status: DC | PRN

## 2015-10-31 MED ORDER — ENOXAPARIN SODIUM 40 MG/0.4ML ~~LOC~~ SOLN
40.0000 mg | SUBCUTANEOUS | Status: DC
  Administered 2015-10-31 – 2015-11-01 (×2): 40 mg via SUBCUTANEOUS
  Filled 2015-10-31 (×2): qty 0.4

## 2015-10-31 MED ORDER — ASPIRIN EC 81 MG PO TBEC
81.0000 mg | DELAYED_RELEASE_TABLET | Freq: Every day | ORAL | Status: DC
  Administered 2015-10-31 – 2015-11-01 (×2): 81 mg via ORAL
  Filled 2015-10-31 (×2): qty 1

## 2015-10-31 MED ORDER — MORPHINE SULFATE (PF) 2 MG/ML IV SOLN
2.0000 mg | INTRAVENOUS | Status: DC | PRN
  Administered 2015-10-31 – 2015-11-01 (×12): 2 mg via INTRAVENOUS
  Filled 2015-10-31 (×12): qty 1

## 2015-10-31 MED ORDER — INSULIN ASPART 100 UNIT/ML ~~LOC~~ SOLN
0.0000 [IU] | SUBCUTANEOUS | Status: DC
  Administered 2015-10-31: 5 [IU] via SUBCUTANEOUS
  Administered 2015-10-31: 3 [IU] via SUBCUTANEOUS
  Administered 2015-10-31: 5 [IU] via SUBCUTANEOUS
  Administered 2015-10-31: 3 [IU] via SUBCUTANEOUS
  Administered 2015-10-31: 5 [IU] via SUBCUTANEOUS
  Administered 2015-11-01: 3 [IU] via SUBCUTANEOUS
  Administered 2015-11-01: 2 [IU] via SUBCUTANEOUS
  Administered 2015-11-01: 5 [IU] via SUBCUTANEOUS
  Administered 2015-11-01 (×2): 2 [IU] via SUBCUTANEOUS
  Administered 2015-11-02: 3 [IU] via SUBCUTANEOUS
  Administered 2015-11-03: 5 [IU] via SUBCUTANEOUS
  Administered 2015-11-03: 3 [IU] via SUBCUTANEOUS
  Administered 2015-11-03: 5 [IU] via SUBCUTANEOUS
  Administered 2015-11-04: 3 [IU] via SUBCUTANEOUS
  Administered 2015-11-04: 2 [IU] via SUBCUTANEOUS
  Administered 2015-11-04: 3 [IU] via SUBCUTANEOUS
  Administered 2015-11-04: 2 [IU] via SUBCUTANEOUS
  Administered 2015-11-05: 3 [IU] via SUBCUTANEOUS
  Administered 2015-11-05: 2 [IU] via SUBCUTANEOUS

## 2015-10-31 MED ORDER — CLOPIDOGREL BISULFATE 75 MG PO TABS
75.0000 mg | ORAL_TABLET | Freq: Every day | ORAL | Status: DC
  Administered 2015-10-31 – 2015-11-01 (×2): 75 mg via ORAL
  Filled 2015-10-31 (×2): qty 1

## 2015-10-31 MED ORDER — DEXTROSE 5 % IV SOLN
2.0000 g | Freq: Once | INTRAVENOUS | Status: AC
  Administered 2015-10-31: 2 g via INTRAVENOUS
  Filled 2015-10-31: qty 2

## 2015-10-31 MED ORDER — INSULIN GLARGINE 100 UNIT/ML ~~LOC~~ SOLN
35.0000 [IU] | Freq: Every day | SUBCUTANEOUS | Status: DC
  Administered 2015-10-31 – 2015-11-13 (×14): 35 [IU] via SUBCUTANEOUS
  Filled 2015-10-31 (×17): qty 0.35

## 2015-10-31 MED ORDER — ACETAMINOPHEN 650 MG RE SUPP: 650.0000 mg | Freq: Four times a day (QID) | RECTAL | Status: DC | PRN

## 2015-10-31 MED ORDER — ONDANSETRON HCL 4 MG PO TABS
4.0000 mg | ORAL_TABLET | Freq: Four times a day (QID) | ORAL | Status: DC | PRN
  Administered 2015-11-01: 4 mg via ORAL
  Filled 2015-10-31: qty 1

## 2015-10-31 MED ORDER — AMLODIPINE BESYLATE 5 MG PO TABS
5.0000 mg | ORAL_TABLET | Freq: Every day | ORAL | Status: DC
  Administered 2015-10-31 – 2015-11-03 (×4): 5 mg via ORAL
  Filled 2015-10-31 (×4): qty 1

## 2015-10-31 MED ORDER — PANTOPRAZOLE SODIUM 40 MG PO TBEC
40.0000 mg | DELAYED_RELEASE_TABLET | Freq: Every day | ORAL | Status: DC
  Administered 2015-10-31 – 2015-11-03 (×3): 40 mg via ORAL
  Filled 2015-10-31 (×3): qty 1

## 2015-10-31 MED ORDER — DEXTROSE 5 % IV SOLN
1.0000 g | Freq: Three times a day (TID) | INTRAVENOUS | Status: DC
  Administered 2015-10-31 – 2015-11-01 (×4): 1 g via INTRAVENOUS
  Filled 2015-10-31 (×6): qty 1

## 2015-10-31 MED ORDER — ACETAMINOPHEN 325 MG PO TABS
650.0000 mg | ORAL_TABLET | Freq: Four times a day (QID) | ORAL | Status: DC | PRN
  Administered 2015-11-01: 650 mg via ORAL
  Filled 2015-10-31: qty 2

## 2015-10-31 MED ORDER — ENSURE ENLIVE PO LIQD
237.0000 mL | Freq: Two times a day (BID) | ORAL | Status: DC
  Administered 2015-10-31: 237 mL via ORAL

## 2015-10-31 MED ORDER — METOPROLOL SUCCINATE ER 50 MG PO TB24
50.0000 mg | ORAL_TABLET | Freq: Every day | ORAL | Status: DC
  Administered 2015-10-31 – 2015-11-02 (×4): 50 mg via ORAL
  Filled 2015-10-31 (×4): qty 1

## 2015-10-31 MED ORDER — LOSARTAN POTASSIUM 50 MG PO TABS
100.0000 mg | ORAL_TABLET | Freq: Every day | ORAL | Status: DC
  Administered 2015-10-31 – 2015-11-03 (×4): 100 mg via ORAL
  Filled 2015-10-31 (×4): qty 2

## 2015-10-31 MED ORDER — GLUCERNA SHAKE PO LIQD
237.0000 mL | Freq: Two times a day (BID) | ORAL | Status: DC
  Administered 2015-11-03: 237 mL via ORAL

## 2015-10-31 MED ORDER — GABAPENTIN 300 MG PO CAPS
300.0000 mg | ORAL_CAPSULE | Freq: Three times a day (TID) | ORAL | Status: DC
  Administered 2015-10-31 – 2015-11-14 (×38): 300 mg via ORAL
  Filled 2015-10-31 (×40): qty 1

## 2015-10-31 NOTE — Progress Notes (Signed)
PROGRESS NOTE  [name] [name]  DGL:[id number] DOB: 06-05-1950 DOA: 10/30/2015 PCP: Georgann Housekeeper, MD Outpatient Specialists:  Subjective: Denies any fever or chills. No much pain on left foot  Brief Narrative:  [name] [name] is a 65 y.o. female with medical history significant of DM, PVD with recent revascularization, MI in 2011, HTN, HLD, and peripheral neuropathy presenting with a diabetic foot infection.  Patient is diabetic and developed a wound on the side of her foot.  Went to the doctor, [facility] ([facility]).  2 weeks ago, developed a blister on the side of her foot and it burst.  Didn't have transportation to the doctor and so missed several appointments.  When she made it there today, they were concerned about these ulcers and sent her to the ER for further evaluation.  Diabetes for about 14 years.  On insulin. Sugars run a little high, 200 range.  Prior A1c was 13, but down to 7 at last check recently.  Used to hceck glucose twice daily but not recently.  She was admitted from 8/31-09/29/15 for PAD and PVD with claudication; she had stenting of the distal left common femoral artery during that hospitalization.  She had previously had a staged left common femoral endarterectomy with profundoplasty on 09/05/15  Assessment & Plan:   Principal Problem:   Osteomyelitis (HCC) Active Problems:   Diabetes mellitus type II, uncontrolled (HCC)   Hyperlipidemia with target LDL less than 70   PVD (peripheral vascular disease) with claudication (HCC)   Essential hypertension   Osteomyelitis of left foot including forefoot and possibly toes 3-5 -Patient with reported h/o foot infection with known PVD but multiple missed appointments to wound care center since revascularization procedures in Aug 2017 -Antibiotic coverage for osteo with Aztreonam and Vancomycin for now (based on potentially serious PCN allergy) -I have consulted Dr. Edilia Bo of vascular  surgery. -Patient is diabetic, but it's unclear if this is secondary to diabetic insensate neuropathy versus vascular disease. -Last ABIs showed ischemic range (ABI 0.3) on both feet. -If prolonged antibiotics indicated then we will get ID to evaluate.  Diabetes with peripheral neuropathy -Uncontrolled, hemoglobin A1c 8.3 in August 2017. -Continue Lantus but hold Metformin -Cover with SSI  HLD -Continue Lipitor 80mg   HTN -Continue Norvasc, Cozaar, Toprol  Other -Patient reviewed in the Controlled Substances Registry.  She had 3 prescriptions for short-term narcotics by different providers in August, but does not appear to be taking long-term narcotics.   -Would suggest caution in prescribing narcotics to this patient based on non-compliance with appointments.    DVT prophylaxis: Lovenox Code Status: Full Code Family Communication:  Disposition Plan:  Diet: Diet Carb Modified Fluid consistency: Thin; Room service appropriate? Yes  Consultants:   Edilia Bo  Procedures:   None  Antimicrobials:   Vancomycin and aztreonam  Objective: Vitals:   10/30/15 2340 10/31/15 0150 10/31/15 0342 10/31/15 0638  BP: 172/72 (!) 189/65 (!) 161/62 (!) 172/59  Pulse: 100 95 79 80  Resp:  16 (!) 1 16  Temp:  98.2 F (36.8 C) 98.2 F (36.8 C) 98.1 F (36.7 C)  TempSrc:  Oral Oral Oral  SpO2: 100% 96%  97%  Weight:      Height:        Intake/Output Summary (Last 24 hours) at 10/31/15 1236 Last data filed at 10/31/15 1041  Gross per 24 hour  Intake  540 ml  Output                0 ml  Net              540 ml   Filed Weights   10/30/15 1644  Weight: 72.6 kg (160 lb)    Examination: General exam: Appears calm and comfortable  Respiratory system: Clear to auscultation. Respiratory effort normal. Cardiovascular system: S1 & S2 heard, RRR. No JVD, murmurs, rubs, gallops or clicks. No pedal edema. Gastrointestinal system: Abdomen is nondistended, soft and  nontender. No organomegaly or masses felt. Normal bowel sounds heard. Central nervous system: Alert and oriented. No focal neurological deficits. Extremities: Symmetric 5 x 5 power. Skin: No rashes, lesions or ulcers Psychiatry: Judgement and insight appear normal. Mood & affect appropriate.   Data Reviewed: I have personally reviewed following labs and imaging studies  CBC:  Recent Labs Lab 10/30/15 1930 10/31/15 0615  WBC 13.3* 10.1  NEUTROABS 10.0*  --   HGB 10.5* 9.7*  HCT 31.2* 30.1*  MCV 85.0 85.3  PLT 445* 364   Basic Metabolic Panel:  Recent Labs Lab 10/30/15 1930 10/31/15 0615  NA 137 139  K 3.7 4.1  CL 103 104  CO2 26 28  GLUCOSE 172* 149*  BUN 10 6  CREATININE 0.56 0.57  CALCIUM 8.6* 8.7*   GFR: Estimated Creatinine Clearance: 68.5 mL/min (by C-G formula based on SCr of 0.57 mg/dL). Liver Function Tests: No results for input(s): AST, ALT, ALKPHOS, BILITOT, PROT, ALBUMIN in the last 168 hours. No results for input(s): LIPASE, AMYLASE in the last 168 hours. No results for input(s): AMMONIA in the last 168 hours. Coagulation Profile: No results for input(s): INR, PROTIME in the last 168 hours. Cardiac Enzymes: No results for input(s): CKTOTAL, CKMB, CKMBINDEX, TROPONINI in the last 168 hours. BNP (last 3 results) No results for input(s): PROBNP in the last 8760 hours. HbA1C: No results for input(s): HGBA1C in the last 72 hours. CBG:  Recent Labs Lab 10/31/15 0353 10/31/15 0752  GLUCAP 163* 167*   Lipid Profile: No results for input(s): CHOL, HDL, LDLCALC, TRIG, CHOLHDL, LDLDIRECT in the last 72 hours. Thyroid Function Tests: No results for input(s): TSH, T4TOTAL, FREET4, T3FREE, THYROIDAB in the last 72 hours. Anemia Panel: No results for input(s): VITAMINB12, FOLATE, FERRITIN, TIBC, IRON, RETICCTPCT in the last 72 hours. Urine analysis:    Component Value Date/Time   COLORURINE YELLOW 09/05/2015 0641   APPEARANCEUR CLEAR 09/05/2015 0641    LABSPEC 1.013 09/05/2015 0641   PHURINE 6.0 09/05/2015 0641   GLUCOSEU 100 (A) 09/05/2015 0641   HGBUR TRACE (A) 09/05/2015 0641   BILIRUBINUR NEGATIVE 09/05/2015 0641   KETONESUR NEGATIVE 09/05/2015 0641   PROTEINUR 100 (A) 09/05/2015 0641   UROBILINOGEN 1.0 10/16/2009 0243   NITRITE NEGATIVE 09/05/2015 0641   LEUKOCYTESUR NEGATIVE 09/05/2015 0641   Sepsis Labs: @LABRCNTIP (procalcitonin:4,lacticidven:4)  ) Recent Results (from the past 240 hour(s))  Surgical pcr screen     Status: None   Collection Time: 10/31/15  2:02 AM  Result Value Ref Range Status   MRSA, PCR NEGATIVE NEGATIVE Final   Staphylococcus aureus NEGATIVE NEGATIVE Final    Comment:        The Xpert SA Assay (FDA approved for NASAL specimens in patients over 27 years of age), is one component of a comprehensive surveillance program.  Test performance has been validated by [facility] for patients greater than or equal to 52 year old. It is not  intended to diagnose infection nor to guide or monitor treatment.      Invalid input(s): PROCALCITONIN, LACTICACIDVEN   Radiology Studies: Dg Foot Complete Left  Result Date: 10/30/2015 CLINICAL DATA:  Dorsal foot pain. Skin lesion with possible wound infection. EXAM: LEFT FOOT - COMPLETE 3+ VIEW COMPARISON:  09/19/2015 FINDINGS: There is definite destructive osteomyelitis of the fifth toe affecting the distal metatarsal an the proximal phalanx. There is probable osteomyelitis of the fourth toe affecting the distal metatarsal an the proximal phalanx. There is questionable osteomyelitis of the proximal phalanx of the third toe. IMPRESSION: Osteomyelitis of the forefoot, definite affecting the fifth toe, probable affecting the fourth toe and possible affecting the third toe. See above. Electronically Signed   By: Paulina FusiMark  Shogry M.D.   On: 10/30/2015 20:05    Scheduled Meds: . amLODipine  5 mg Oral Daily  . aspirin EC  81 mg Oral Daily  . atorvastatin  80 mg Oral Daily   . aztreonam  1 g Intravenous Q8H  . clopidogrel  75 mg Oral Daily  . enoxaparin (LOVENOX) injection  40 mg Subcutaneous Q24H  . feeding supplement (ENSURE ENLIVE)  237 mL Oral BID BM  . gabapentin  300 mg Oral TID  . insulin aspart  0-15 Units Subcutaneous Q4H  . insulin glargine  35 Units Subcutaneous Q2200  . isosorbide mononitrate  90 mg Oral Daily  . losartan  100 mg Oral Daily  . metoprolol succinate  50 mg Oral Daily  . pantoprazole  40 mg Oral Daily  . vancomycin  750 mg Intravenous Q12H   Continuous Infusions:    LOS: 1 day    Time spent: 35 minutes    Roemello Speyer A, MD [facility] Pager [phone]  If 7PM-7AM, please contact night-coverage [email or web address] Password [email or web address] 10/31/2015, 12:36 PM

## 2015-10-31 NOTE — Progress Notes (Signed)
Initial Nutrition Assessment  DOCUMENTATION CODES:   Non-severe (moderate) malnutrition in context of acute illness/injury  INTERVENTION:  Provide Glucerna Shake po BID, each supplement provides 220 kcal and 10 grams of protein.  Encourage adequate PO intake.   NUTRITION DIAGNOSIS:   Inadequate oral intake related to poor appetite as evidenced by per patient/family report.  GOAL:   Patient will meet greater than or equal to 90% of their needs  MONITOR:   PO intake, Supplement acceptance, Labs, Weight trends, Skin, I & O's  REASON FOR ASSESSMENT:   Malnutrition Screening Tool    ASSESSMENT:   65 y.o. female with medical history significant of DM, PVD with recent revascularization, MI in 2011, HTN, HLD, and peripheral neuropathy presenting with a diabetic foot infection.  Pt reports having a decreased appetite which has been ongoing over the past 2 weeks. Pt does however report appetite has improved since admission. Meal completion has been 75%. Pt reports she has been consuming at least 2-3 meals a day, however intake has been less (~75% less at meals). Pt reports a 10 lb weight loss in 2 weeks. Pt with a 5.8% weight loss. Pt currently has Ensure ordered. Pt agreeable to Glucerna Shake. RD to modify orders.   Limited Nutrition-Focused physical exam completed. Findings are no fat depletion, no muscle depletion, and no edema. Lower extremities were not observed as pt reports painful cramping in them.   Labs and medications reviewed.   Diet Order:  Diet Carb Modified Fluid consistency: Thin; Room service appropriate? Yes  Skin:  Wound (see comment) (Osteomyelitis on L foot)  Last BM:  10/1  Height:   Ht Readings from Last 1 Encounters:  10/30/15 5\' 4"  (1.626 m)    Weight:   Wt Readings from Last 1 Encounters:  10/30/15 160 lb (72.6 kg)    Ideal Body Weight:  54.5 kg  BMI:  Body mass index is 27.46 kg/m.  Estimated Nutritional Needs:   Kcal:   1800-1950  Protein:  85-95 grams  Fluid:  1.8 - 2 L/day  EDUCATION NEEDS:   No education needs identified at this time  Roslyn Smiling, MS, RD, LDN Pager # [phone] After hours/ weekend pager # [phone]

## 2015-10-31 NOTE — Progress Notes (Signed)
When patient arrived to unit blood pressure 189/65 HR 95 she stated that she had not taken any of her medication for blood pressure gave her medications Norvasc, lorasartin,and  metoprolol  Blood pressure decreased charted in EPIC. Patient also had a headache that also decreased. Ilean Skill LPN

## 2015-10-31 NOTE — ED Notes (Signed)
Attempt to call report to nurse on 5N, No answer.

## 2015-10-31 NOTE — ED Provider Notes (Signed)
[facility]-EMERGENCY DEPT Provider Note   CSN: [id number] Arrival date & time: 10/30/15  1629     History   Chief Complaint Chief Complaint  Patient presents with  . foot infection-[facility]    HPI [name] [name] is a 65 y.o. female.  HPI Patient presents to the emergency department with a wound to the bottom of her left foot.  The patient was currently being treated for a wound in the lateral aspect of her left foot and states that she developed blistered areas to the bottom of her foot about 5 days ago and a ruptured and now she has wounds that are seeping purulent material.  The patient was sent from the wound care center for admission to the hospital due to the fact these wounds were not draining cyst purulent material.  The patient states that she did not take any medications prior to arrival.  She had no new medicationsThe patient denies chest pain, shortness of breath, headache,blurred vision, neck pain, fever, cough, weakness, numbness, dizziness, anorexia, edema, abdominal pain, nausea, vomiting, diarrhea, rash, back pain, dysuria, hematemesis, bloody stool, near syncope, or syncope.  Past Medical History:  Diagnosis Date  . Arthritis    "feel like I have it all over" (08/28/2015)  . Complication of anesthesia    DIFFICULT WAKING "only when I was smoking; no problems since I quit"  . Coronary artery disease   . Family history of adverse reaction to anesthesia    sister slow to wake up  . GERD (gastroesophageal reflux disease)    takes Protonix daily   . Hip bursitis   . Hyperlipidemia LDL goal < 70 06/28/2013   takes Atorvastatin daily  . Hypertension    takes Metoprolol and Imdur daily  . Migraine    "none in a long time" (08/28/2015)  . Myocardial infarction 2011  . Neuromuscular disorder (HCC)    DIABETIC NEUROPATHY  . PAD (peripheral artery disease) (HCC)   . Peripheral vascular disease (HCC)   . Type II diabetes mellitus (HCC)    takes Lantus  nightly.Average fasting blood sugar runs 80-90    Patient Active Problem List   Diagnosis Date Noted  . Essential hypertension 10/31/2015  . Osteomyelitis (HCC) 10/30/2015  . PVD (peripheral vascular disease) with claudication (HCC) 09/28/2015  . Critical lower limb ischemia 08/28/2015  . Hyperlipidemia with target LDL less than 70 06/28/2013  . Diabetes mellitus type II, uncontrolled (HCC) 03/03/2013  . Complicated migraine 03/03/2013  . Stroke-like symptom 03/02/2013  . Diabetes mellitus (HCC) 03/02/2013  . Aphasia 03/02/2013  . Headache(784.0) 03/02/2013  . PAD (peripheral artery disease) (HCC) 06/05/2011  . CAD (coronary artery disease): DES to dominant Circ 10/13/09 06/05/2011  . Hypotension 06/05/2011  . Claudication in peripheral vascular disease:  Lifestyle limiting. 06/05/2011  . Hx of tobacco use, presenting hazards to health 06/05/2011    Past Surgical History:  Procedure Laterality Date  . ABDOMINAL HYSTERECTOMY    . APPENDECTOMY    . ATHERECTOMY N/A 06/04/2011   Procedure: ATHERECTOMY;  Surgeon: Runell Gess, MD;  Location: [facility] CATH LAB;  Service: Cardiovascular;  Laterality: N/A;  . CARDIAC CATHETERIZATION  10/13/2009   95% stenosis in the AV groove circumflex and 95% ostial stenosis in small OM3. A 3x80mm drug-eluting Promus stent inserted ito the circumflex. Dilatated with a 3.25x37mm noncompliant Quantum balloon within entire segment. The entire region was reduced to 0% and brisk TIMI3 flow.  . CAROTID DUPLEX  03/19/2011   Right  ICA-demonstrates complete occlusion. Left ICA-demonstrates a small amount of fibrous plaque.  Marland Kitchen CATARACT EXTRACTION W/ INTRAOCULAR LENS IMPLANT Right   . CESAREAN SECTION  1990  . CORONARY ANGIOPLASTY    . ENDARTERECTOMY FEMORAL Left 09/05/2015   Procedure: ENDARTERECTOMY FEMORAL WITH PROFUNDOPLASTY;  Surgeon: Chuck Hint, MD;  Location: [facility] OR;  Service: Vascular;  Laterality: Left;  Left common femoral artery vein patch using left  saphenous vien  . ILIAC ARTERY STENT Left 08/28/2015   common  . INTRAOPERATIVE ARTERIOGRAM Left 09/05/2015   Procedure: INTRA OPERATIVE ARTERIOGRAM;  Surgeon: Chuck Hint, MD;  Location: [facility] OR;  Service: Vascular;  Laterality: Left;  . LEXISCAN MYOVIEW  10/25/2010   Moderate perfusion defect due to infarct/scar with mild perinfarct ischemia seen in the Basal Inferolateral, Basal Anterolateral, Mid Inferolateral, and Mid Anterolateral regions. Post-stress EF is 50%.  . OVARY SURGERY  1983?   "ruptured"  . PERIPHERAL VASCULAR ANGIOGRAM  01/26/2010   High-grade SFA disease: left greater than right. Left SFA would require fem-pop bypass grafting. Right SFA could be stented but might require Diamondback Orbital atherectomy.  Marland Kitchen PERIPHERAL VASCULAR ANGIOGRAM  02/23/2010   Stealth Predator orbital rotational atherectomy performed on SFA & Popliteal up to 90,000 RPM. Stenting using overlapping 5x144mm and 5x24mm Absolute Pro Nitinol self-expanding stents beginning just at the knee up to the mid SFA resulting in reduction of 90-95% calcified SFA & Popliteal stenosis to 0. Stenting performed on the distal common & proximal iliac artery with a 10x4 Absolute Pro- 70-0%.  Marland Kitchen PERIPHERAL VASCULAR ANGIOGRAM  06/17/2010   PTA performed to the right external iliac artery stent using a 5x100 balloon at 10 atmospheres. Stenting performed using a 6x18 Genesis on Opta balloon. Postdilatation with a 7x2 balloon resulting in a 95% "in-stent" stenosis to 0% residual.  . PERIPHERAL VASCULAR ANGIOGRAM  06/04/2011   Bilateral total SFAs not percutaneously addressable. Good canidate for femoropopliteal bypass grafting  . PERIPHERAL VASCULAR ANGIOGRAM  08/28/2015  . PERIPHERAL VASCULAR CATHETERIZATION N/A 08/28/2015   Procedure: Lower Extremity Angiography;  Surgeon: Runell Gess, MD;  Location: [facility] INVASIVE CV LAB;  Service: Cardiovascular;  Laterality: N/A;  . PERIPHERAL VASCULAR CATHETERIZATION N/A 08/28/2015    Procedure: Abdominal Aortogram;  Surgeon: Runell Gess, MD;  Location: [facility] INVASIVE CV LAB;  Service: Cardiovascular;  Laterality: N/A;  . PERIPHERAL VASCULAR CATHETERIZATION Left 08/28/2015   Procedure: Peripheral Vascular Intervention;  Surgeon: Runell Gess, MD;  Location: [facility] INVASIVE CV LAB;  Service: Cardiovascular;  Laterality: Left;  common iliac  . PERIPHERAL VASCULAR CATHETERIZATION Left 08/28/2015   Procedure: Peripheral Vascular Atherectomy;  Surgeon: Runell Gess, MD;  Location: [facility] INVASIVE CV LAB;  Service: Cardiovascular;  Laterality: Left;  common iliac  . PERIPHERAL VASCULAR CATHETERIZATION N/A 09/28/2015   Procedure: Lower Extremity Angiography;  Surgeon: Runell Gess, MD;  Location: [facility] INVASIVE CV LAB;  Service: Cardiovascular;  Laterality: N/A;  . PERIPHERAL VASCULAR CATHETERIZATION Left 09/28/2015   Procedure: Peripheral Vascular Intervention;  Surgeon: Runell Gess, MD;  Location: [facility] INVASIVE CV LAB;  Service: Cardiovascular;  Laterality: Left CFA  PCI with 9 mm x 4 cm Abbott nitinol absolute Pro self-expanding stent     . TRANSTHORACIC ECHOCARDIOGRAM  10/17/2009   EF 45-50%, moderate hypokinesis of the entire inferolateral myocardium, mild concentric hypertrophy and mild regurg of the mitral valva.    OB History    No data available       Home Medications    Prior to Admission  medications   Medication Sig Start Date End Date Taking? Authorizing Provider  amLODipine (NORVASC) 5 MG tablet TAKE 1 TABLET(5 MG) BY MOUTH DAILY 03/28/15  Yes Runell Gess, MD  aspirin EC 81 MG tablet Take 81 mg by mouth daily.   Yes Historical Provider, MD  atorvastatin (LIPITOR) 80 MG tablet Take 1 tablet (80 mg total) by mouth daily. 06/30/15  Yes Lennette Bihari, MD  clopidogrel (PLAVIX) 75 MG tablet Take 1 tablet (75 mg total) by mouth daily. Holding for vascular surgery, restart as soon as possible after leg surgery (after 8/8) 08/29/15  Yes Azalee Course, PA  gabapentin  (NEURONTIN) 300 MG capsule Take 300 mg by mouth 3 (three) times daily.  10/10/15  Yes Historical Provider, MD  isosorbide mononitrate (IMDUR) 60 MG 24 hr tablet Take 90 mg by mouth daily.    Yes Historical Provider, MD  ketoconazole (NIZORAL) 2 % cream Apply 1 application topically daily.  10/20/15  Yes Historical Provider, MD  losartan (COZAAR) 100 MG tablet TAKE 1 TABLET BY MOUTH EVERY DAY 10/31/14  Yes Lennette Bihari, MD  metFORMIN (GLUCOPHAGE) 1000 MG tablet Take 1,000 mg by mouth 2 (two) times daily with a meal.   Yes Historical Provider, MD  metoprolol succinate (TOPROL-XL) 50 MG 24 hr tablet Take 1 tablet (50 mg total) by mouth daily. 09/11/15  Yes Lennette Bihari, MD  oxyCODONE-acetaminophen (PERCOCET/ROXICET) 5-325 MG tablet Take 1-2 tablets by mouth every 6 (six) hours as needed for moderate pain. 09/07/15  Yes Samantha J Rhyne, PA-C  pantoprazole (PROTONIX) 40 MG tablet Take 1 tablet (40 mg total) by mouth daily. 03/20/15  Yes Peter M [place], MD  SANTYL ointment APP TOPICALLY UTD 10/05/15  Yes Historical Provider, MD  Insulin Glargine (LANTUS) 100 UNIT/ML Solostar Pen Inject 35 Units into the skin daily at 10 pm.  03/04/13   Catarina Hartshorn, MD  OVER THE COUNTER MEDICATION Apply 1 application topically daily. [place]    Historical Provider, MD    Family History Family History  Problem Relation Age of Onset  . Hypertension Mother   . Heart failure Mother   . Heart failure Father   . Stroke Father   . Diabetes Father     Social History Social History  Substance Use Topics  . Smoking status: Former Smoker    Packs/day: 1.50    Years: 41.00    Types: Cigarettes    Quit date: 10/12/2009  . Smokeless tobacco: Never Used  . Alcohol use No     Allergies   Hydrochlorothiazide; Latex; and Penicillins   Review of Systems Review of Systems All other systems negative except as documented in the HPI. All pertinent positives and negatives as reviewed in the HPI.  Physical Exam Updated Vital  Signs BP 172/72   Pulse 100   Temp 98.1 F (36.7 C)   Resp 16   Ht 5\' 4"  (1.626 m)   Wt 72.6 kg   SpO2 100%   BMI 27.46 kg/m   Physical Exam  Constitutional: She is oriented to person, place, and time. She appears well-developed and well-nourished. No distress.  HENT:  Head: Normocephalic and atraumatic.  Mouth/Throat: Oropharynx is clear and moist.  Eyes: Pupils are equal, round, and reactive to light.  Neck: Normal range of motion. Neck supple.  Cardiovascular: Normal rate, regular rhythm and normal heart sounds.  Exam reveals no gallop and no friction rub.   No murmur heard. Pulmonary/Chest: Effort normal and breath sounds normal. No  respiratory distress. She has no wheezes.  Abdominal: Soft. Bowel sounds are normal. She exhibits no distension. There is no tenderness.  Musculoskeletal:       Feet:  Neurological: She is alert and oriented to person, place, and time. She exhibits normal muscle tone. Coordination normal.  Skin: Skin is warm and dry. No rash noted. No erythema.  Psychiatric: She has a normal mood and affect. Her behavior is normal.  Nursing note and vitals reviewed.    ED Treatments / Results  Labs (all labs ordered are listed, but only abnormal results are displayed) Labs Reviewed  BASIC METABOLIC PANEL - Abnormal; Notable for the following:       Result Value   Glucose, Bld 172 (*)    Calcium 8.6 (*)    All other components within normal limits  CBC WITH DIFFERENTIAL/PLATELET - Abnormal; Notable for the following:    WBC 13.3 (*)    RBC 3.67 (*)    Hemoglobin 10.5 (*)    HCT 31.2 (*)    Platelets 445 (*)    Neutro Abs 10.0 (*)    All other components within normal limits    EKG  EKG Interpretation None       Radiology Dg Foot Complete Left  Result Date: 10/30/2015 CLINICAL DATA:  Dorsal foot pain. Skin lesion with possible wound infection. EXAM: LEFT FOOT - COMPLETE 3+ VIEW COMPARISON:  09/19/2015 FINDINGS: There is definite destructive  osteomyelitis of the fifth toe affecting the distal metatarsal an the proximal phalanx. There is probable osteomyelitis of the fourth toe affecting the distal metatarsal an the proximal phalanx. There is questionable osteomyelitis of the proximal phalanx of the third toe. IMPRESSION: Osteomyelitis of the forefoot, definite affecting the fifth toe, probable affecting the fourth toe and possible affecting the third toe. See above. Electronically Signed   By: Paulina FusiMark  Shogry M.D.   On: 10/30/2015 20:05    Procedures Procedures (including critical care time)  Medications Ordered in ED Medications  oxyCODONE-acetaminophen (PERCOCET/ROXICET) 5-325 MG per tablet 1-2 tablet (1 tablet Oral Given 10/31/15 0003)  aztreonam (AZACTAM) 2 g in dextrose 5 % 50 mL IVPB (not administered)  vancomycin (VANCOCIN) 1,500 mg in sodium chloride 0.9 % 500 mL IVPB (0 mg Intravenous Stopped 10/30/15 2335)  levofloxacin (LEVAQUIN) IVPB 750 mg (0 mg Intravenous Stopped 10/30/15 2231)  morphine 4 MG/ML injection 4 mg (4 mg Intravenous Given 10/30/15 2058)  oxyCODONE-acetaminophen (PERCOCET/ROXICET) 5-325 MG per tablet 1 tablet (1 tablet Oral Given 10/30/15 2230)     Initial Impression / Assessment and Plan / ED Course  I have reviewed the triage vital signs and the nursing notes.  Pertinent labs & imaging results that were available during my care of the patient were reviewed by me and considered in my medical decision making (see chart for details).  Clinical Course  I started the patient on antibiotics for osteomyelitis.  I spoke with the [facility] hospitalists will be down to evaluate the patient for admission.  Patient is advised plan and all questions were answered.  She is also given pain control  Final Clinical Impressions(s) / ED Diagnoses   Final diagnoses:  Osteomyelitis of left foot, unspecified type [facility](HCC)    New Prescriptions New Prescriptions   No medications on file     Charlestine NightChristopher Jerrian Mells,  PA-C 10/31/15 0042    Mancel BaleElliott Wentz, MD 10/31/15 2124    Mancel BaleElliott Wentz, MD 10/31/15 2124

## 2015-10-31 NOTE — ED Notes (Signed)
[facility] dispatch notified of transfer

## 2015-10-31 NOTE — ED Notes (Signed)
Cindy from [facility] called for report prior to transfer.

## 2015-10-31 NOTE — ED Notes (Addendum)
Attempt to call report to nurse on 5N, no answer.

## 2015-10-31 NOTE — Consult Note (Addendum)
Patient name: [name] [name] MRN: [id number] DOB: May 23, 1950 Sex: female  REASON FOR CONSULT: Diabetic foot infection with PVD. Consult is from Dr. Arthor CaptainElmahi.  HPI: [name] [name] is a 65 y.o. female, who was well-known to me. I initially saw her on 08/28/2015 with critical limb ischemia. She has undergone multiple previous endovascular procedures by Dr. Nanetta BattyJonathan Berry. On 09/05/2015, she underwent a left common femoral artery endarterectomy and profundoplasty with vein patch angioplasty of the common femoral artery and deep femoral artery. However she had disease proximally and therefore subsequently required angioplasty and stenting of disease in her external iliac artery by Dr. Nanetta BattyJonathan Berry. This was done via a brachial approach. She has known infrainguinal arterial occlusive disease.  She was admitted yesterday with an extensive diabetic foot infection. Vascular surgeries consult for further recommendations. Because of her immobility she had missed several appointments for wound care of the foot and presents now with a very extensive wound on her foot. She had developed a large blister on the lateral aspect of her foot which burst. She currently is not a smoker.  She does have a history of bilateral lower extremity claudication. Currently however her activity has been very limited. She also has had rest pain in her left foot.  Her risk factors for peripheral vascular disease include diabetes, hypertension, hyperlipidemia, and history of tobacco use in the past.  She had a myocardial infarction in 2006. She had PTCA at that time. She has not had any recent chest pain or chest pressure.  Past Medical History:  Diagnosis Date  . Arthritis    "feel like I have it all over" (08/28/2015)  . Complication of anesthesia    DIFFICULT WAKING "only when I was smoking; no problems since I quit"  . Coronary artery disease   . Family history of adverse reaction to anesthesia    sister slow to wake up  .  GERD (gastroesophageal reflux disease)    takes Protonix daily   . Hip bursitis   . Hyperlipidemia LDL goal < 70 06/28/2013   takes Atorvastatin daily  . Hypertension    takes Metoprolol and Imdur daily  . Migraine    "none in a long time" (08/28/2015)  . Myocardial infarction 2011  . Neuromuscular disorder (HCC)    DIABETIC NEUROPATHY  . PAD (peripheral artery disease) (HCC)   . Peripheral vascular disease (HCC)   . Type II diabetes mellitus (HCC)    takes Lantus nightly.Average fasting blood sugar runs 80-90    Family History  Problem Relation Age of Onset  . Hypertension Mother   . Heart failure Mother   . Heart failure Father   . Stroke Father   . Diabetes Father     SOCIAL HISTORY: Social History   Social History  . Marital status: Married    Spouse name: N/A  . Number of children: N/A  . Years of education: N/A   Occupational History  . retired    Social History Main Topics  . Smoking status: Former Smoker    Packs/day: 1.50    Years: 41.00    Types: Cigarettes    Quit date: 10/12/2009  . Smokeless tobacco: Never Used  . Alcohol use No  . Drug use: No  . Sexual activity: Not Currently    Birth control/ protection: Surgical   Other Topics Concern  . Not on file   Social History Narrative  . No narrative on file    Allergies  Allergen Reactions  .  Hydrochlorothiazide Other (See Comments)    Unknown  . Latex Rash  . Penicillins Swelling, Rash and Other (See Comments)    Has patient had a PCN reaction causing immediate rash, facial/tongue/throat swelling, SOB or lightheadedness with hypotension: Yes Has patient had a PCN reaction causing severe rash involving mucus membranes or skin necrosis: No Has patient had a PCN reaction that required hospitalization No Has patient had a PCN reaction occurring within the last 10 years: No If all of the above answers are "NO", then may proceed with Cephalosporin use.     Current Facility-Administered  Medications  Medication Dose Route Frequency Provider Last Rate Last Dose  . acetaminophen (TYLENOL) tablet 650 mg  650 mg Oral Q6H PRN Jonah Blue, MD       Or  . acetaminophen (TYLENOL) suppository 650 mg  650 mg Rectal Q6H PRN Jonah Blue, MD      . amLODipine (NORVASC) tablet 5 mg  5 mg Oral Daily Jonah Blue, MD   5 mg at 10/31/15 0914  . aspirin EC tablet 81 mg  81 mg Oral Daily Jonah Blue, MD   81 mg at 10/31/15 0914  . atorvastatin (LIPITOR) tablet 80 mg  80 mg Oral Daily Jonah Blue, MD   80 mg at 10/31/15 0914  . aztreonam (AZACTAM) 1 g in dextrose 5 % 50 mL IVPB  1 g Intravenous Q8H Jonah Blue, MD   1 g at 10/31/15 0518  . clopidogrel (PLAVIX) tablet 75 mg  75 mg Oral Daily Jonah Blue, MD   75 mg at 10/31/15 0914  . enoxaparin (LOVENOX) injection 40 mg  40 mg Subcutaneous Q24H Jonah Blue, MD   40 mg at 10/31/15 0913  . feeding supplement (ENSURE ENLIVE) (ENSURE ENLIVE) liquid 237 mL  237 mL Oral BID BM Jonah Blue, MD   237 mL at 10/31/15 1105  . gabapentin (NEURONTIN) capsule 300 mg  300 mg Oral TID Jonah Blue, MD   300 mg at 10/31/15 0914  . insulin aspart (novoLOG) injection 0-15 Units  0-15 Units Subcutaneous Q4H Jonah Blue, MD   3 Units at 10/31/15 0800  . insulin glargine (LANTUS) injection 35 Units  35 Units Subcutaneous Q2200 Jonah Blue, MD      . isosorbide mononitrate (IMDUR) 24 hr tablet 90 mg  90 mg Oral Daily Jonah Blue, MD   90 mg at 10/31/15 0914  . losartan (COZAAR) tablet 100 mg  100 mg Oral Daily Jonah Blue, MD   100 mg at 10/31/15 0915  . metoprolol succinate (TOPROL-XL) 24 hr tablet 50 mg  50 mg Oral Daily Jonah Blue, MD   50 mg at 10/31/15 0914  . morphine 2 MG/ML injection 2 mg  2 mg Intravenous Q2H PRN Jonah Blue, MD   2 mg at 10/31/15 0915  . ondansetron (ZOFRAN) tablet 4 mg  4 mg Oral Q6H PRN Jonah Blue, MD       Or  . ondansetron [facility]) injection 4 mg  4 mg Intravenous Q6H PRN Jonah Blue, MD       . oxyCODONE-acetaminophen (PERCOCET/ROXICET) 5-325 MG per tablet 1-2 tablet  1-2 tablet Oral Q6H PRN Jonah Blue, MD   1 tablet at 10/31/15 0539  . pantoprazole (PROTONIX) EC tablet 40 mg  40 mg Oral Daily Jonah Blue, MD   40 mg at 10/31/15 0914  . vancomycin (VANCOCIN) IVPB 750 mg/150 ml premix  750 mg Intravenous Q12H Jonah Blue, MD   750 mg at 10/31/15 [phone]  REVIEW OF SYSTEMS:  [X]  denotes positive finding, [ ]  denotes negative finding Cardiac  Comments:  Chest pain or chest pressure:    Shortness of breath upon exertion: X   Short of breath when lying flat:    Irregular heart rhythm:        Vascular    Pain in calf, thigh, or hip brought on by ambulation: X   Pain in feet at night that wakes you up from your sleep:  X Left foot  Blood clot in your veins:    Leg swelling:         Pulmonary    Oxygen at home:    Productive cough:     Wheezing:         Neurologic    Sudden weakness in arms or legs:     Sudden numbness in arms or legs:     Sudden onset of difficulty speaking or slurred speech:    Temporary loss of vision in one eye:     Problems with dizziness:         Gastrointestinal    Blood in stool:     Vomited blood:         Genitourinary    Burning when urinating:     Blood in urine:        Psychiatric    Major depression:         Hematologic    Bleeding problems:    Problems with blood clotting too easily:        Skin    Rashes or ulcers:        Constitutional    Fever or chills:      PHYSICAL EXAM: Vitals:   10/31/15 0150 10/31/15 0342 10/31/15 0638 10/31/15 1300  BP: (!) 189/65 (!) 161/62 (!) 172/59 (!) 168/60  Pulse: 95 79 80 80  Resp: 16 (!) 1 16 16   Temp: 98.2 F (36.8 C) 98.2 F (36.8 C) 98.1 F (36.7 C) 98 F (36.7 C)  TempSrc: Oral Oral Oral Oral  SpO2: 96%  97% 98%  Weight:      Height:        GENERAL: The patient is a well-nourished female, in no acute distress. The vital signs are documented above. CARDIAC:  There is a regular rate and rhythm.  VASCULAR: I do not detect carotid bruits. She has palpable femoral pulses. I cannot palpate pedal pulses. She does have monophasic markedly dampened Doppler signals in the left dorsalis pedis and posterior tibial positions. She also has a monophasic right dorsalis pedis and posterior tibial signal. PULMONARY: There is good air exchange bilaterally without wheezing or rales. ABDOMEN: Soft and non-tender with normal pitched bowel sounds.  MUSCULOSKELETAL: There are no major deformities. She does have some difficulty straightening her left knee. NEUROLOGIC: No focal weakness or paresthesias are detected. SKIN: She has extensive cellulitis of the left foot with an open wound on the plantar aspect of the foot not too far from the heel. She hasn't eschar on the fifth metatarsal on the lateral aspect of the foot. PSYCHIATRIC: The patient has a normal affect.  DATA:   ARTERIOGRAM: I have reviewed her most recent arteriogram. She underwent successful stenting of the distal external iliac artery stenosis. Her vein patch angioplasty and profundoplasty site are patent. She has a superficial femoral artery occlusion on the left with reconstitution of a diseased below-knee popliteal artery. There is peroneal artery runoff and also an anterior tibial artery which is poorly visualized.  Her creatinine is 0.57. GFR is greater than 60. White count is 10.1.  Plain x-ray of the left foot shows osteomyelitis of the forefoot affecting the fifth toe and also probably effecting the fourth toe and third toe.  MEDICAL ISSUES:  DIABETIC FOOT INFECTION WITH SEVERE INFRAINGUINAL ARTERIAL OCCLUSIVE DISEASE: This patient has a limb threatening infection of the left foot with severe peripheral vascular disease. I think that without revascularization she will require a below-the-knee amputation. Even with revascularization, given the extent of the infection she is at significant risk for  limb loss. The infection extends back towards the heel. Dr. Lajoyce Corners has been consulted. I will obtain a vein map and will schedule her for left fem pop bypass grafting or femoral-tibial bypass for Thursday if cleared medically. I have reviewed the indications for lower extremity bypass. I have also reviewed the potential complications of surgery including but not limited to: wound healing problems, infection, graft thrombosis, limb loss, or other unpredictable medical problems. All the patient's questions were answered.  Waverly Ferrari [facility] [phone]

## 2015-10-31 NOTE — Progress Notes (Addendum)
Pharmacy Antibiotic Note  [name] [name] is a 65 y.o. female admitted on 10/30/2015 with L foot wound/osteomyelitis.  Pharmacy has been consulted for Vancomycin and Aztreonam  Dosing.  Vancomycin 1500 mg IV given in ED at  2100  Plan: Vancomycin 750 mg IV q12h Aztreonam 1 g IV q8h  Height: 5\' 4"  (162.6 cm) Weight: 160 lb (72.6 kg) IBW/kg (Calculated) : 54.7  Temp (24hrs), Avg:98.2 F (36.8 C), Min:98.1 F (36.7 C), Max:98.2 F (36.8 C)   Recent Labs Lab 10/30/15 1930  WBC 13.3*  CREATININE 0.56    Estimated Creatinine Clearance: 68.5 mL/min (by C-G formula based on SCr of 0.56 mg/dL).    Allergies  Allergen Reactions  . Hydrochlorothiazide Other (See Comments)    Unknown  . Latex Rash  . Penicillins Swelling, Rash and Other (See Comments)    Has patient had a PCN reaction causing immediate rash, facial/tongue/throat swelling, SOB or lightheadedness with hypotension: Yes Has patient had a PCN reaction causing severe rash involving mucus membranes or skin necrosis: No Has patient had a PCN reaction that required hospitalization No Has patient had a PCN reaction occurring within the last 10 years: No If all of the above answers are "NO", then may proceed with Cephalosporin use.    Eddie Candle 10/31/2015 1:54 AM

## 2015-11-01 ENCOUNTER — Inpatient Hospital Stay (HOSPITAL_COMMUNITY): Payer: Medicare Other

## 2015-11-01 ENCOUNTER — Encounter (HOSPITAL_COMMUNITY): Payer: Self-pay

## 2015-11-01 ENCOUNTER — Telehealth: Payer: Self-pay | Admitting: *Deleted

## 2015-11-01 DIAGNOSIS — E440 Moderate protein-calorie malnutrition: Secondary | ICD-10-CM | POA: Diagnosis present

## 2015-11-01 DIAGNOSIS — Z87891 Personal history of nicotine dependence: Secondary | ICD-10-CM

## 2015-11-01 DIAGNOSIS — Z823 Family history of stroke: Secondary | ICD-10-CM

## 2015-11-01 DIAGNOSIS — L97425 Non-pressure chronic ulcer of left heel and midfoot with muscle involvement without evidence of necrosis: Secondary | ICD-10-CM

## 2015-11-01 DIAGNOSIS — I739 Peripheral vascular disease, unspecified: Secondary | ICD-10-CM

## 2015-11-01 DIAGNOSIS — E08621 Diabetes mellitus due to underlying condition with foot ulcer: Secondary | ICD-10-CM | POA: Diagnosis present

## 2015-11-01 DIAGNOSIS — Z01810 Encounter for preprocedural cardiovascular examination: Secondary | ICD-10-CM

## 2015-11-01 DIAGNOSIS — Z833 Family history of diabetes mellitus: Secondary | ICD-10-CM

## 2015-11-01 DIAGNOSIS — Z8249 Family history of ischemic heart disease and other diseases of the circulatory system: Secondary | ICD-10-CM

## 2015-11-01 LAB — BASIC METABOLIC PANEL
ANION GAP: 8 (ref 5–15)
BUN: 13 mg/dL (ref 6–20)
CHLORIDE: 103 mmol/L (ref 101–111)
CO2: 27 mmol/L (ref 22–32)
Calcium: 8.7 mg/dL — ABNORMAL LOW (ref 8.9–10.3)
Creatinine, Ser: 0.71 mg/dL (ref 0.44–1.00)
GFR calc Af Amer: >60 mL/min (ref >60)
GFR calc non Af Amer: >60 mL/min (ref >60)
GLUCOSE: 120 mg/dL — AB (ref 65–99)
POTASSIUM: 4.3 mmol/L (ref 3.5–5.1)
Sodium: 138 mmol/L (ref 135–145)

## 2015-11-01 LAB — GLUCOSE, CAPILLARY
GLUCOSE-CAPILLARY: 106 mg/dL — AB (ref 65–99)
GLUCOSE-CAPILLARY: 148 mg/dL — AB (ref 65–99)
Glucose-Capillary: 131 mg/dL — ABNORMAL HIGH (ref 65–99)
Glucose-Capillary: 136 mg/dL — ABNORMAL HIGH (ref 65–99)
Glucose-Capillary: 232 mg/dL — ABNORMAL HIGH (ref 65–99)
Glucose-Capillary: 169 mg/dL — ABNORMAL HIGH (ref 65–99)

## 2015-11-01 LAB — CBC
HEMATOCRIT: 27.6 % — AB (ref 36.0–46.0)
HEMOGLOBIN: 8.9 g/dL — AB (ref 12.0–15.0)
MCH: 27.4 pg (ref 26.0–34.0)
MCHC: 32.2 g/dL (ref 30.0–36.0)
MCV: 84.9 fL (ref 78.0–100.0)
Platelets: 342 10*3/uL (ref 150–400)
RBC: 3.25 MIL/uL — ABNORMAL LOW (ref 3.87–5.11)
RDW: 14.4 % (ref 11.5–15.5)
WBC: 8.9 10*3/uL (ref 4.0–10.5)

## 2015-11-01 MED ORDER — GADOBENATE DIMEGLUMINE 529 MG/ML IV SOLN
15.0000 mL | Freq: Once | INTRAVENOUS | Status: AC | PRN
  Administered 2015-11-01: 15 mL via INTRAVENOUS

## 2015-11-01 MED ORDER — CEFTRIAXONE SODIUM 1 G IJ SOLR
1.0000 g | Freq: Once | INTRAMUSCULAR | Status: AC
  Administered 2015-11-01: 1 g via INTRAVENOUS
  Filled 2015-11-01: qty 10

## 2015-11-01 MED ORDER — VANCOMYCIN HCL IN DEXTROSE 1-5 GM/200ML-% IV SOLN: 1000.0000 mg | INTRAVENOUS | Status: DC

## 2015-11-01 NOTE — Progress Notes (Signed)
   VASCULAR SURGERY ASSESSMENT & PLAN:  I plan to proceed with left lower extremity bypass tomorrow if okay with the medical service. Her vein mapping is pending. I think without revascularization she would require a below the knee amputation. Even with revascularization there is risk of limb loss given the extent of the wound on her foot.  I reviewed her previous arteriogram which shows occlusion of the superficial femoral artery and reconstitution of a diseased below-knee popliteal artery. There is peroneal runoff on the left and also possibly an anterior tibial artery which is poorly visualized.  I have reviewed the indications for lower extremity bypass. I have also reviewed the potential complications of surgery including but not limited to: wound healing problems, infection, graft thrombosis, limb loss, or other unpredictable medical problems. All the patient's questions were answered and they are agreeable to proceed.  SUBJECTIVE: No specific complaints.  PHYSICAL EXAM: Vitals:   10/31/15 1300 10/31/15 2132 11/01/15 0500 11/01/15 0949  BP: (!) 168/60 (!) 125/53 (!) 144/55 (!) 149/68  Pulse: 80 81 85 77  Resp: 16 18    Temp: 98 F (36.7 C) 98.3 F (36.8 C) 98.2 F (36.8 C)   TempSrc: Oral Oral Oral   SpO2: 98% 98% 99%   Weight:      Height:       Palpable left femoral pulse.  LABS: Lab Results  Component Value Date   WBC 8.9 11/01/2015   HGB 8.9 (L) 11/01/2015   HCT 27.6 (L) 11/01/2015   MCV 84.9 11/01/2015   PLT 342 11/01/2015   Lab Results  Component Value Date   CREATININE 0.71 11/01/2015   Lab Results  Component Value Date   INR 1.1 09/19/2015   CBG (last 3)   Recent Labs  11/01/15 0521 11/01/15 0758 11/01/15 1110  GLUCAP 106* 136* 232*    Principal Problem:   Osteomyelitis (HCC) Active Problems:   Diabetes mellitus type II, uncontrolled (HCC)   Hyperlipidemia with target LDL less than 70   PVD (peripheral vascular disease) with claudication  (HCC)   Essential hypertension    Chris [name] [name] Beeper: [phone] 11/01/2015   

## 2015-11-01 NOTE — Progress Notes (Signed)
PROGRESS NOTE  [name] [name]  ZOX:[id number] DOB: Oct 13, 1950 DOA: 10/30/2015 PCP: Georgann Housekeeper, MD Outpatient Specialists:  Subjective: Denies any fever or chills. No much pain on left foot  Brief Narrative:  [name] [name] is a 65 y.o. female with medical history significant of DM, PVD with recent revascularization, MI in 2011, HTN, HLD, and peripheral neuropathy presenting with a diabetic foot infection.  Patient is diabetic and developed a wound on the side of her foot.  Went to the doctor, [facility]  2 weeks ago, developed a blister on the side of her foot and it burst.  Didn't have transportation to the doctor and so missed several appointments.  When she made it there today, they were concerned about these ulcers and sent her to the ER for further evaluation.  Diabetes for about 14 years.  On insulin. Sugars run a little high, 200 range.  Prior A1c was 13, but down to 7 at last check recently.  Used to hceck glucose twice daily but not recently.  She was admitted from 8/31-09/29/15 for PAD and PVD with claudication; she had stenting of the distal left common femoral artery during that hospitalization.  She had previously had a staged left common femoral endarterectomy with profundoplasty on 09/05/15  Subjective:  Patient seen and examined at bedside. Continues to c/o foot pain, although better controlled with pain medication. No acute events overnight.  Discussed with patient her condition and possible plan. Also discussed the possible need for an amputation given poor circulatory system and Dm. Patient going under LLE bypass revascularization   Assessment & Plan:  Osteomyelitis of left foot including forefoot and possibly toes 3-5 -Patient with reported h/o foot infection with known PVD but multiple missed appointments to wound care center since revascularization procedures in Aug 2017 -Patient is diabetic, but it's unclear if this is secondary  to diabetic insensate neuropathy versus vascular disease. -Last ABIs showed ischemic range (ABI 0.3) on both feet -Antibiotic coverage for osteo with Aztreonam and Vancomycin for now (based on potentially serious PCN allergy) -Plan for revascularization tomorrow by vascular  -ID consulted for long term antibiotic use  -Will order PICC line - poor access and long term abx  -Ortho consulted for evaluation of possible amputation.  -Medically stable for vascular procedure   Diabetes with peripheral neuropathy -Uncontrolled, hemoglobin A1c 8.3 in August 2017. -Continue Lantus but hold Metformin -Cover with SSI  HLD -Continue Lipitor 80mg   HTN -Continue Norvasc, Cozaar, Toprol  Other -Patient reviewed in the Controlled Substances Registry.  She had 3 prescriptions for short-term narcotics by different providers in August, but does not appear to be taking long-term narcotics.   -Would suggest caution in prescribing narcotics to this patient based on non-compliance with appointments.   DVT prophylaxis: Lovenox Code Status: Full Code Family Communication:  Daughter at bedside Disposition Plan: Discharge when medically improving  Diet: Diet Carb Modified Fluid consistency: Thin; Room service appropriate? Yes Diet NPO time specified Except for: Sips with Meds  Consultants:   Edilia Bo  Procedures:   None  Antimicrobials:   Vancomycin and aztreonam  Objective: Vitals:   10/31/15 1300 10/31/15 2132 11/01/15 0500 11/01/15 0949  BP: (!) 168/60 (!) 125/53 (!) 144/55 (!) 149/68  Pulse: 80 81 85 77  Resp: 16 18    Temp: 98 F (36.7 C) 98.3 F (36.8 C) 98.2 F (36.8 C)   TempSrc: Oral Oral Oral   SpO2: 98% 98% 99%  Weight:      Height:        Intake/Output Summary (Last 24 hours) at 11/01/15 1343 Last data filed at 11/01/15 1000  Gross per 24 hour  Intake              360 ml  Output                0 ml  Net              360 ml   Filed Weights   10/30/15 1644    Weight: 72.6 kg (160 lb)    Examination: General exam: Appears calm and comfortable  Respiratory system: Clear to auscultation. Respiratory effort normal. Cardiovascular system: S1 & S2 heard, RRR. No JVD, murmurs, rubs, gallops or clicks. No pedal edema. Gastrointestinal system: Abdomen is nondistended, soft and nontender. No organomegaly or masses felt.  Central nervous system: Alert and oriented. No focal neurological deficits. Extremities: Symmetric, left knee difficult ROM due to pain Skin: Ulcerated dry eschar lesion at the Left lateral foot, and mid forefoot, erythema improving  Psychiatry: Judgement and insight appear normal. Mood & affect appropriate.   Data Reviewed: I have personally reviewed following labs and imaging studies  CBC:  Recent Labs Lab 10/30/15 1930 10/31/15 0615 11/01/15 0647  WBC 13.3* 10.1 8.9  NEUTROABS 10.0*  --   --   HGB 10.5* 9.7* 8.9*  HCT 31.2* 30.1* 27.6*  MCV 85.0 85.3 84.9  PLT 445* 364 342   Basic Metabolic Panel:  Recent Labs Lab 10/30/15 1930 10/31/15 0615 11/01/15 0647  NA 137 139 138  K 3.7 4.1 4.3  CL 103 104 103  CO2 26 28 27   GLUCOSE 172* 149* 120*  BUN 10 6 13   CREATININE 0.56 0.57 0.71  CALCIUM 8.6* 8.7* 8.7*   GFR: Estimated Creatinine Clearance: 68.5 mL/min (by C-G formula based on SCr of 0.71 mg/dL). Liver Function Tests: No results for input(s): AST, ALT, ALKPHOS, BILITOT, PROT, ALBUMIN in the last 168 hours. No results for input(s): LIPASE, AMYLASE in the last 168 hours. No results for input(s): AMMONIA in the last 168 hours. Coagulation Profile: No results for input(s): INR, PROTIME in the last 168 hours. Cardiac Enzymes: No results for input(s): CKTOTAL, CKMB, CKMBINDEX, TROPONINI in the last 168 hours. BNP (last 3 results) No results for input(s): PROBNP in the last 8760 hours. HbA1C: No results for input(s): HGBA1C in the last 72 hours. CBG:  Recent Labs Lab 10/31/15 2134 11/01/15 0142  11/01/15 0521 11/01/15 0758 11/01/15 1110  GLUCAP 217* 131* 106* 136* 232*   Lipid Profile: No results for input(s): CHOL, HDL, LDLCALC, TRIG, CHOLHDL, LDLDIRECT in the last 72 hours. Thyroid Function Tests: No results for input(s): TSH, T4TOTAL, FREET4, T3FREE, THYROIDAB in the last 72 hours. Anemia Panel: No results for input(s): VITAMINB12, FOLATE, FERRITIN, TIBC, IRON, RETICCTPCT in the last 72 hours. Urine analysis:    Component Value Date/Time   COLORURINE YELLOW 09/05/2015 0641   APPEARANCEUR CLEAR 09/05/2015 0641   LABSPEC 1.013 09/05/2015 0641   PHURINE 6.0 09/05/2015 0641   GLUCOSEU 100 (A) 09/05/2015 0641   HGBUR TRACE (A) 09/05/2015 0641   BILIRUBINUR NEGATIVE 09/05/2015 0641   KETONESUR NEGATIVE 09/05/2015 0641   PROTEINUR 100 (A) 09/05/2015 0641   UROBILINOGEN 1.0 10/16/2009 0243   NITRITE NEGATIVE 09/05/2015 0641   LEUKOCYTESUR NEGATIVE 09/05/2015 0641   Sepsis Labs: @LABRCNTIP (procalcitonin:4,lacticidven:4)  ) Recent Results (from the past 240 hour(s))  Surgical pcr screen  Status: None   Collection Time: 10/31/15  2:02 AM  Result Value Ref Range Status   MRSA, PCR NEGATIVE NEGATIVE Final   Staphylococcus aureus NEGATIVE NEGATIVE Final    Comment:        The Xpert SA Assay (FDA approved for NASAL specimens in patients over 50 years of age), is one component of a comprehensive surveillance program.  Test performance has been validated by [facility] for patients greater than or equal to 32 year old. It is not intended to diagnose infection nor to guide or monitor treatment.      Invalid input(s): PROCALCITONIN, LACTICACIDVEN   Radiology Studies: Dg Foot Complete Left  Result Date: 10/30/2015 CLINICAL DATA:  Dorsal foot pain. Skin lesion with possible wound infection. EXAM: LEFT FOOT - COMPLETE 3+ VIEW COMPARISON:  09/19/2015 FINDINGS: There is definite destructive osteomyelitis of the fifth toe affecting the distal metatarsal an the  proximal phalanx. There is probable osteomyelitis of the fourth toe affecting the distal metatarsal an the proximal phalanx. There is questionable osteomyelitis of the proximal phalanx of the third toe. IMPRESSION: Osteomyelitis of the forefoot, definite affecting the fifth toe, probable affecting the fourth toe and possible affecting the third toe. See above. Electronically Signed   By: Paulina Fusi M.D.   On: 10/30/2015 20:05    Scheduled Meds: . amLODipine  5 mg Oral Daily  . aspirin EC  81 mg Oral Daily  . atorvastatin  80 mg Oral Daily  . cefTRIAXone (ROCEPHIN)  IV  1 g Intravenous Once  . clopidogrel  75 mg Oral Daily  . enoxaparin (LOVENOX) injection  40 mg Subcutaneous Q24H  . feeding supplement (GLUCERNA SHAKE)  237 mL Oral BID BM  . gabapentin  300 mg Oral TID  . insulin aspart  0-15 Units Subcutaneous Q4H  . insulin glargine  35 Units Subcutaneous Q2200  . isosorbide mononitrate  90 mg Oral Daily  . losartan  100 mg Oral Daily  . metoprolol succinate  50 mg Oral Daily  . pantoprazole  40 mg Oral Daily  . vancomycin  750 mg Intravenous Q12H   Continuous Infusions:    LOS: 2 days    Latrelle Dodrill, MD [facility] Pager [phone]  If 7PM-7AM, please contact night-coverage [email or web address] Password [email or web address] 11/01/2015, 1:43 PM

## 2015-11-01 NOTE — Telephone Encounter (Signed)
-----   Message from Runell Gess, MD sent at 10/28/2015 11:19 AM EDT ----- Mild improvement in LABI s/p mult interventions. Repeat 6 months

## 2015-11-01 NOTE — Consult Note (Signed)
Date of Admission:  10/30/2015  Date of Consult:  11/01/2015  Reason for Consult: Osteomyelitis due to diabetic foot ulcers in a patient with peripheral arterial disease Referring Physician: Dr. Quincy Simmonds   HPI: [name] [name] is an 65 y.o. female with past mental history significant for poorly controlled diabetes mellitus peripheral vascular disease and peripheral arterial disease with recent revascularization current artery disease hypertension hyperlipidemia peripheral neuropathy who has had a diabetic foot ulcer on one side of her foot for approximately 8 weeks having been followed in the wound care center. She was having this managed with local [facility] care. She then approximately 2 weeks ago developed the blisters have her foot and was not seen due to transportation issues until shortly before auscultation when she was seen by wound care they referred her to come to the emerge department for immediate evaluation.  Plain films here showed osteomyelitis of her third fourth and fifth digits. She was admitted to hospitalist service and orthopedics and vascular surgery and subsequently been consult. Antibiotics were started on admission though ants not clear to me that they needed to be started yet.  She is clearly going to need a surgical intervention likely a dictation of 3 toes. Basilar surgery trying to revascularize her with a lower externally bypass surgery due to her poor vascularity and due to concerns that she would lose her limb and not detailed to heal and dictation of her toes and would require a below the knee and dictation.  Note she has ulceration more proximal to her areas of osteomyelitis and I have concerns that she could have osteo-myelitis in those areas that have not been picked up by plain films yet  Past Medical History:  Diagnosis Date  . Arthritis    "feel like I have it all over" (08/28/2015)  . Complication of anesthesia    DIFFICULT WAKING "only when I was smoking;  no problems since I quit"  . Coronary artery disease   . Family history of adverse reaction to anesthesia    sister slow to wake up  . GERD (gastroesophageal reflux disease)    takes Protonix daily   . Hip bursitis   . Hyperlipidemia LDL goal < 70 06/28/2013   takes Atorvastatin daily  . Hypertension    takes Metoprolol and Imdur daily  . Migraine    "none in a long time" (08/28/2015)  . Myocardial infarction 2011  . Neuromuscular disorder ([place])    DIABETIC NEUROPATHY  . PAD (peripheral artery disease) ([place])   . Peripheral vascular disease ([place])   . Type II diabetes mellitus (HCC)    takes Lantus nightly.Average fasting blood sugar runs 80-90    Past Surgical History:  Procedure Laterality Date  . ABDOMINAL HYSTERECTOMY    . APPENDECTOMY    . ATHERECTOMY N/A 06/04/2011   Procedure: ATHERECTOMY;  Surgeon: Lorretta Harp, MD;  Location[facility] CATH LAB;  Service: Cardiovascular;  Laterality: N/A;  . CARDIAC CATHETERIZATION  10/13/2009   95% stenosis in the AV groove circumflex and 95% ostial stenosis in small OM3. A 3x66m drug-eluting Promus stent inserted ito the circumflex. Dilatated with a 3.25x15mnoncompliant Quantum balloon within entire segment. The entire region was reduced to 0% and brisk TIMI3 flow.  . CAROTID DUPLEX  03/19/2011   Right ICA-demonstrates complete occlusion. Left ICA-demonstrates a small amount of fibrous plaque.  . Marland KitchenATARACT EXTRACTION W/ INTRAOCULAR LENS IMPLANT Right   . CESAREAN SECTION  1990  . CORONARY ANGIOPLASTY    .  ENDARTERECTOMY FEMORAL Left 09/05/2015   Procedure: ENDARTERECTOMY FEMORAL WITH PROFUNDOPLASTY;  Surgeon: Angelia Mould, MD;  Location[facility] OR;  Service: Vascular;  Laterality: Left;  Left common femoral artery vein patch using left saphenous vien  . ILIAC ARTERY STENT Left 08/28/2015   common  . INTRAOPERATIVE ARTERIOGRAM Left 09/05/2015   Procedure: INTRA OPERATIVE ARTERIOGRAM;  Surgeon: Angelia Mould, MD;  Location: [facility];   Service: Vascular;  Laterality: Left;  . LEXISCAN MYOVIEW  10/25/2010   Moderate perfusion defect due to infarct/scar with mild perinfarct ischemia seen in the Basal Inferolateral, Basal Anterolateral, Mid Inferolateral, and Mid Anterolateral regions. Post-stress EF is 50%.  . [place]?   "ruptured"  . PERIPHERAL VASCULAR ANGIOGRAM  01/26/2010   High-grade SFA disease: left greater than right. Left SFA would require fem-pop bypass grafting. Right SFA could be stented but might require Diamondback Orbital atherectomy.  Marland Kitchen PERIPHERAL VASCULAR ANGIOGRAM  02/23/2010   Stealth Predator orbital rotational atherectomy performed on SFA & Popliteal up to 90,000 RPM. Stenting using overlapping 5x186m and 5x676mAbsolute Pro Nitinol self-expanding stents beginning just at the knee up to the mid SFA resulting in reduction of 90-95% calcified SFA & Popliteal stenosis to 0. Stenting performed on the distal common & proximal iliac artery with a 10x4 Absolute Pro- 70-0%.  . Marland KitchenERIPHERAL VASCULAR ANGIOGRAM  06/17/2010   PTA performed to the right external iliac artery stent using a 5x100 balloon at 10 atmospheres. Stenting performed using a 6x18 Genesis on Opta balloon. Postdilatation with a 7x2 balloon resulting in a 95% "in-stent" stenosis to 0% residual.  . PERIPHERAL VASCULAR ANGIOGRAM  06/04/2011   Bilateral total SFAs not percutaneously addressable. Good canidate for femoropopliteal bypass grafting  . PERIPHERAL VASCULAR ANGIOGRAM  08/28/2015  . PERIPHERAL VASCULAR CATHETERIZATION N/A 08/28/2015   Procedure: Lower Extremity Angiography;  Surgeon: JoLorretta HarpMD;  [facility];  Service: Cardiovascular;  Laterality: N/A;  . PERIPHERAL VASCULAR CATHETERIZATION N/A 08/28/2015   Procedure: Abdominal Aortogram;  Surgeon: JoLorretta HarpMD;  [facility];  Service: Cardiovascular;  Laterality: N/A;  . PERIPHERAL VASCULAR CATHETERIZATION Left 08/28/2015   Procedure:  Peripheral Vascular Intervention;  Surgeon: JoLorretta HarpMD;  [facility];  Service: Cardiovascular;  Laterality: Left;  common iliac  . PERIPHERAL VASCULAR CATHETERIZATION Left 08/28/2015   Procedure: Peripheral Vascular Atherectomy;  Surgeon: JoLorretta HarpMD;  [facility];  Service: Cardiovascular;  Laterality: Left;  common iliac  . PERIPHERAL VASCULAR CATHETERIZATION N/A 09/28/2015   Procedure: Lower Extremity Angiography;  Surgeon: JoLorretta HarpMD;  [facility];  Service: Cardiovascular;  Laterality: N/A;  . PERIPHERAL VASCULAR CATHETERIZATION Left 09/28/2015   Procedure: Peripheral Vascular Intervention;  Surgeon: JoLorretta HarpMD;  [facility];  Service: Cardiovascular;  Laterality: Left CFA  PCI with 9 mm x 4 cm Abbott nitinol absolute Pro self-expanding stent     . TRANSTHORACIC ECHOCARDIOGRAM  10/17/2009   EF 45-50%, moderate hypokinesis of the entire inferolateral myocardium, mild concentric hypertrophy and mild regurg of the mitral valva.    Social History:  reports that she quit smoking about 6 years ago. Her smoking use included Cigarettes. She has a 61.50 pack-year smoking history. She has never used smokeless tobacco. She reports that she does not drink alcohol or use drugs.   Family History  Problem Relation Age of Onset  . Hypertension Mother   .  Heart failure Mother   . Heart failure Father   . Stroke Father   . Diabetes Father     Allergies  Allergen Reactions  . Hydrochlorothiazide Other (See Comments)    Unknown  . Latex Rash  . Penicillins Swelling and Rash    Pt states she has tolerated Keflex in the past without problems. States she may have tolerated Augmentin in the past but it caused GI upset.     Medications: I have reviewed patients current medications as documented in Epic Anti-infectives    Start     Dose/Rate Route Frequency Ordered Stop   11/02/15 0600  vancomycin  (VANCOCIN) IVPB 1000 mg/200 mL premix  Status:  Discontinued     1,000 mg 200 mL/hr over 60 Minutes Intravenous On call to O.R. 11/01/15 0854 11/01/15 0910   11/01/15 1300  cefTRIAXone (ROCEPHIN) 1 g in dextrose 5 % 50 mL IVPB     1 g 100 mL/hr over 30 Minutes Intravenous  Once 11/01/15 1215 11/01/15 1407   10/31/15 0600  vancomycin (VANCOCIN) IVPB 750 mg/150 ml premix     750 mg 150 mL/hr over 60 Minutes Intravenous Every 12 hours 10/31/15 0156     10/31/15 0600  aztreonam (AZACTAM) 1 g in dextrose 5 % 50 mL IVPB  Status:  Discontinued     1 g 100 mL/hr over 30 Minutes Intravenous Every 8 hours 10/31/15 0210 11/01/15 1215   10/31/15 0045  aztreonam (AZACTAM) 2 g in dextrose 5 % 50 mL IVPB     2 g 100 mL/hr over 30 Minutes Intravenous  Once 10/31/15 0033 10/31/15 0131   10/30/15 2030  vancomycin (VANCOCIN) 1,500 mg in sodium chloride 0.9 % 500 mL IVPB     1,500 mg 250 mL/hr over 120 Minutes Intravenous  Once 10/30/15 2020 10/30/15 2335   10/30/15 2030  levofloxacin (LEVAQUIN) IVPB 750 mg     750 mg 100 mL/hr over 90 Minutes Intravenous  Once 10/30/15 2020 10/30/15 2231         ROS:as in HPI otherwise remainder of 12 point Review of Systems is negative   Blood pressure (!) 97/37, pulse 76, temperature 98 F (36.7 C), resp. rate 16, height 5' 4"  (1.626 m), weight 160 lb (72.6 kg), SpO2 91 %. General: Alert and awake, oriented x3, not in any acute distress. HEENT: anicteric sclera,  EOMI, oropharynx clear and without exudate Cardiovascular: regular rate, normal r,  no murmur rubs or gallops Pulmonary: clear to auscultation bilaterally, no wheezing, rales or rhonchi Gastrointestinal: soft nontender, nondistended, normal bowel sounds, Musculoskeletal:skin  11/01/15:  33 week old ulcer    32 week old ulcer      Neuro: nonfocal, strength and sensation intact   Results for orders placed or performed during the hospital encounter of 10/30/15 (from the past 48 hour(s))    Basic metabolic panel     Status: Abnormal   Collection Time: 10/30/15  7:30 PM  Result Value Ref Range   Sodium 137 135 - 145 mmol/L   Potassium 3.7 3.5 - 5.1 mmol/L   Chloride 103 101 - 111 mmol/L   CO2 26 22 - 32 mmol/L   Glucose, Bld 172 (H) 65 - 99 mg/dL   BUN 10 6 - 20 mg/dL   Creatinine, Ser 0.56 0.44 - 1.00 mg/dL   Calcium 8.6 (L) 8.9 - 10.3 mg/dL   GFR calc non Af Amer >60 >60 mL/min   GFR calc Af Amer >60 >60 mL/min  Comment: (NOTE) The eGFR has been calculated using the CKD EPI equation. This calculation has not been validated in all clinical situations. eGFR's persistently <60 mL/min signify possible Chronic Kidney Disease.    Anion gap 8 5 - 15  CBC with Differential     Status: Abnormal   Collection Time: 10/30/15  7:30 PM  Result Value Ref Range   WBC 13.3 (H) 4.0 - 10.5 K/uL   RBC 3.67 (L) 3.87 - 5.11 MIL/uL   Hemoglobin 10.5 (L) 12.0 - 15.0 g/dL   HCT 31.2 (L) 36.0 - 46.0 %   MCV 85.0 78.0 - 100.0 fL   MCH 28.6 26.0 - 34.0 pg   MCHC 33.7 30.0 - 36.0 g/dL   RDW 14.3 11.5 - 15.5 %   Platelets 445 (H) 150 - 400 K/uL   Neutrophils Relative % 75 %   Neutro Abs 10.0 (H) 1.7 - 7.7 K/uL   Lymphocytes Relative 17 %   Lymphs Abs 2.2 0.7 - 4.0 K/uL   Monocytes Relative 7 %   Monocytes Absolute 0.9 0.1 - 1.0 K/uL   Eosinophils Relative 1 %   Eosinophils Absolute 0.2 0.0 - 0.7 K/uL   Basophils Relative 0 %   Basophils Absolute 0.0 0.0 - 0.1 K/uL  Surgical pcr screen     Status: None   Collection Time: 10/31/15  2:02 AM  Result Value Ref Range   MRSA, PCR NEGATIVE NEGATIVE   Staphylococcus aureus NEGATIVE NEGATIVE    Comment:        The Xpert SA Assay (FDA approved for NASAL specimens in patients over 30 years of age), is one component of a comprehensive surveillance program.  Test performance has been validated by [facility] for patients greater than or equal to 34 year old. It is not intended to diagnose infection nor to guide or monitor  treatment.   Glucose, capillary     Status: Abnormal   Collection Time: 10/31/15  3:53 AM  Result Value Ref Range   Glucose-Capillary 163 (H) 65 - 99 mg/dL  Basic metabolic panel     Status: Abnormal   Collection Time: 10/31/15  6:15 AM  Result Value Ref Range   Sodium 139 135 - 145 mmol/L   Potassium 4.1 3.5 - 5.1 mmol/L   Chloride 104 101 - 111 mmol/L   CO2 28 22 - 32 mmol/L   Glucose, Bld 149 (H) 65 - 99 mg/dL   BUN 6 6 - 20 mg/dL   Creatinine, Ser 0.57 0.44 - 1.00 mg/dL   Calcium 8.7 (L) 8.9 - 10.3 mg/dL   GFR calc non Af Amer >60 >60 mL/min   GFR calc Af Amer >60 >60 mL/min    Comment: (NOTE) The eGFR has been calculated using the CKD EPI equation. This calculation has not been validated in all clinical situations. eGFR's persistently <60 mL/min signify possible Chronic Kidney Disease.    Anion gap 7 5 - 15  CBC     Status: Abnormal   Collection Time: 10/31/15  6:15 AM  Result Value Ref Range   WBC 10.1 4.0 - 10.5 K/uL   RBC 3.53 (L) 3.87 - 5.11 MIL/uL   Hemoglobin 9.7 (L) 12.0 - 15.0 g/dL   HCT 30.1 (L) 36.0 - 46.0 %   MCV 85.3 78.0 - 100.0 fL   MCH 27.5 26.0 - 34.0 pg   MCHC 32.2 30.0 - 36.0 g/dL   RDW 14.3 11.5 - 15.5 %   Platelets 364 150 - 400  K/uL  Glucose, capillary     Status: Abnormal   Collection Time: 10/31/15  7:52 AM  Result Value Ref Range   Glucose-Capillary 167 (H) 65 - 99 mg/dL   Comment 1 Repeat Test    Comment 2 Document in Chart   Glucose, capillary     Status: Abnormal   Collection Time: 10/31/15  2:24 PM  Result Value Ref Range   Glucose-Capillary 232 (H) 65 - 99 mg/dL  Glucose, capillary     Status: Abnormal   Collection Time: 10/31/15  4:49 PM  Result Value Ref Range   Glucose-Capillary 231 (H) 65 - 99 mg/dL  Glucose, capillary     Status: Abnormal   Collection Time: 10/31/15  9:34 PM  Result Value Ref Range   Glucose-Capillary 217 (H) 65 - 99 mg/dL  Glucose, capillary     Status: Abnormal   Collection Time: 11/01/15  1:42 AM   Result Value Ref Range   Glucose-Capillary 131 (H) 65 - 99 mg/dL  Glucose, capillary     Status: Abnormal   Collection Time: 11/01/15  5:21 AM  Result Value Ref Range   Glucose-Capillary 106 (H) 65 - 99 mg/dL  Basic metabolic panel     Status: Abnormal   Collection Time: 11/01/15  6:47 AM  Result Value Ref Range   Sodium 138 135 - 145 mmol/L   Potassium 4.3 3.5 - 5.1 mmol/L   Chloride 103 101 - 111 mmol/L   CO2 27 22 - 32 mmol/L   Glucose, Bld 120 (H) 65 - 99 mg/dL   BUN 13 6 - 20 mg/dL   Creatinine, Ser 0.71 0.44 - 1.00 mg/dL   Calcium 8.7 (L) 8.9 - 10.3 mg/dL   GFR calc non Af Amer >60 >60 mL/min   GFR calc Af Amer >60 >60 mL/min    Comment: (NOTE) The eGFR has been calculated using the CKD EPI equation. This calculation has not been validated in all clinical situations. eGFR's persistently <60 mL/min signify possible Chronic Kidney Disease.    Anion gap 8 5 - 15  CBC     Status: Abnormal   Collection Time: 11/01/15  6:47 AM  Result Value Ref Range   WBC 8.9 4.0 - 10.5 K/uL   RBC 3.25 (L) 3.87 - 5.11 MIL/uL   Hemoglobin 8.9 (L) 12.0 - 15.0 g/dL   HCT 27.6 (L) 36.0 - 46.0 %   MCV 84.9 78.0 - 100.0 fL   MCH 27.4 26.0 - 34.0 pg   MCHC 32.2 30.0 - 36.0 g/dL   RDW 14.4 11.5 - 15.5 %   Platelets 342 150 - 400 K/uL  Glucose, capillary     Status: Abnormal   Collection Time: 11/01/15  7:58 AM  Result Value Ref Range   Glucose-Capillary 136 (H) 65 - 99 mg/dL  Glucose, capillary     Status: Abnormal   Collection Time: 11/01/15 11:10 AM  Result Value Ref Range   Glucose-Capillary 232 (H) 65 - 99 mg/dL  Glucose, capillary     Status: Abnormal   Collection Time: 11/01/15  4:18 PM  Result Value Ref Range   Glucose-Capillary 169 (H) 65 - 99 mg/dL   Comment 1 Repeat Test    Comment 2 Document in Chart    @BRIEFLABTABLE (sdes,specrequest,cult,reptstatus)   ) Recent Results (from the past 720 hour(s))  Surgical pcr screen     Status: None   Collection Time: 10/31/15  2:02  AM  Result Value Ref Range Status   MRSA,  PCR NEGATIVE NEGATIVE Final   Staphylococcus aureus NEGATIVE NEGATIVE Final    Comment:        The Xpert SA Assay (FDA approved for NASAL specimens in patients over 29 years of age), is one component of a comprehensive surveillance program.  Test performance has been validated by [facility] for patients greater than or equal to 85 year old. It is not intended to diagnose infection nor to guide or monitor treatment.      Impression/Recommendation  Principal Problem:   Osteomyelitis ([place]) Active Problems:   Diabetes mellitus type II, uncontrolled ([place])   Hyperlipidemia with target LDL less than 70   PVD (peripheral vascular disease) with claudication ([place])   Essential hypertension   Malnutrition of moderate degree   [name] [name] is a 65 y.o. female with  Poorly controlled diabetes mellitus with peripheral vascular disease diabetic foot ulcers arterial disease and osteomyelitis of third fourth and fifth digits  #1 Diabetic foot ulcers with osteomyelitis:  Greatly appreciate VVS trying to optimize her circulation via revascularization surgery to allow her to avoid below the knee and dictation  I also like to get MRI of her foot to make sure there is no osteomyelitis more proximal in her foot to the areas that are seen on plain films and her toes  We aligned to change her from her aztreonam and vancomycin to vancomycin and ceftriaxone to be taken preoperatively prior to her bypass surgery  After bypass surgery would actually like to stop her antibiotics to give [place] a chance of obtaining cultures in the OR should she undergo an dictation of her toes and/or debridement of other tissue that can be sent to lab for culture and sensitivity testing to allow targeted antimicrobial therapy  Engage Diabetic Foot Focused Order Set  #2 Screening: check HCV, HIV, HBV  11/01/2015, 5:17 PM   Thank you so much for this interesting  consult  [place] for [place] [phone] (pager) [phone] (office) 11/01/2015, 5:17 PM  Rhina [place] 11/01/2015, 5:17 PM

## 2015-11-01 NOTE — Progress Notes (Signed)
Left Lower Extremity Vein Map    Left Great Saphenous Vein   Segment Diameter Comment  1. Origin 4.83mm   2. High Thigh 5.54mm   3. Mid Thigh 3.59mm Branch  4. Low Thigh 2.48mm Branch  5. At Knee 3.23mm   6. High Calf 2.54mm Branch  7. Low Calf 1.89mm Branch  8. Ankle 1.51mm     Incidental finding of an enlarged lymph node in the proximal left thigh measuring 16.30mm x 8.38mm.  11/01/15 2:38 PM Olen Cordial RVT

## 2015-11-01 NOTE — Telephone Encounter (Signed)
No answer. No way to leave message, mailbox full. Call daugther on DPR and gave results, she verbalized understanding and will let patient know. Repeat order entered.

## 2015-11-02 ENCOUNTER — Inpatient Hospital Stay (HOSPITAL_COMMUNITY): Payer: Medicare Other | Admitting: Certified Registered Nurse Anesthetist

## 2015-11-02 ENCOUNTER — Encounter (HOSPITAL_COMMUNITY)
Admission: EM | Disposition: A | Discharge status: Skilled Nursing Facility | Payer: Self-pay | Source: Home / Self Care | Attending: Family Medicine

## 2015-11-02 ENCOUNTER — Inpatient Hospital Stay (HOSPITAL_COMMUNITY): Payer: Medicare Other

## 2015-11-02 ENCOUNTER — Telehealth: Payer: Self-pay | Admitting: Vascular Surgery

## 2015-11-02 ENCOUNTER — Encounter (HOSPITAL_COMMUNITY): Payer: Self-pay | Admitting: Anesthesiology

## 2015-11-02 DIAGNOSIS — Z95828 Presence of other vascular implants and grafts: Secondary | ICD-10-CM

## 2015-11-02 HISTORY — PX: VEIN HARVEST: SHX6363

## 2015-11-02 HISTORY — PX: FEMORAL-POPLITEAL BYPASS GRAFT: SHX937

## 2015-11-02 HISTORY — PX: INTRAOPERATIVE ARTERIOGRAM: SHX5157

## 2015-11-02 LAB — PROTIME-INR
INR: 1.22
Prothrombin Time: 15.4 seconds — ABNORMAL HIGH (ref 11.4–15.2)

## 2015-11-02 LAB — HIV ANTIBODY (ROUTINE TESTING W REFLEX): HIV SCREEN 4TH GENERATION: NONREACTIVE

## 2015-11-02 LAB — CBC
HEMATOCRIT: 25.1 % — AB (ref 36.0–46.0)
HEMATOCRIT: 25.7 % — AB (ref 36.0–46.0)
HEMOGLOBIN: 8.2 g/dL — AB (ref 12.0–15.0)
Hemoglobin: 8.3 g/dL — ABNORMAL LOW (ref 12.0–15.0)
MCH: 27.9 pg (ref 26.0–34.0)
MCH: 27.3 pg (ref 26.0–34.0)
MCHC: 33.1 g/dL (ref 30.0–36.0)
MCHC: 31.9 g/dL (ref 30.0–36.0)
MCV: 84.2 fL (ref 78.0–100.0)
MCV: 85.7 fL (ref 78.0–100.0)
Platelets: 339 10*3/uL (ref 150–400)
Platelets: 339 10*3/uL (ref 150–400)
RBC: 2.98 MIL/uL — ABNORMAL LOW (ref 3.87–5.11)
RBC: 3 MIL/uL — ABNORMAL LOW (ref 3.87–5.11)
RDW: 14.5 % (ref 11.5–15.5)
RDW: 14.6 % (ref 11.5–15.5)
WBC: 9.9 10*3/uL (ref 4.0–10.5)
WBC: 19.8 10*3/uL — AB (ref 4.0–10.5)

## 2015-11-02 LAB — BASIC METABOLIC PANEL
Anion gap: 7 (ref 5–15)
BUN: 13 mg/dL (ref 6–20)
CHLORIDE: 104 mmol/L (ref 101–111)
CO2: 27 mmol/L (ref 22–32)
Calcium: 8.5 mg/dL — ABNORMAL LOW (ref 8.9–10.3)
Creatinine, Ser: 0.77 mg/dL (ref 0.44–1.00)
GFR calc non Af Amer: >60 mL/min (ref >60)
Glucose, Bld: 133 mg/dL — ABNORMAL HIGH (ref 65–99)
Potassium: 3.7 mmol/L (ref 3.5–5.1)
SODIUM: 138 mmol/L (ref 135–145)

## 2015-11-02 LAB — GLUCOSE, CAPILLARY
GLUCOSE-CAPILLARY: 100 mg/dL — AB (ref 65–99)
GLUCOSE-CAPILLARY: 184 mg/dL — AB (ref 65–99)
GLUCOSE-CAPILLARY: 63 mg/dL — AB (ref 65–99)
GLUCOSE-CAPILLARY: 90 mg/dL (ref 65–99)
GLUCOSE-CAPILLARY: 94 mg/dL (ref 65–99)
Glucose-Capillary: 115 mg/dL — ABNORMAL HIGH (ref 65–99)
Glucose-Capillary: 98 mg/dL (ref 65–99)
Glucose-Capillary: 107 mg/dL — ABNORMAL HIGH (ref 65–99)

## 2015-11-02 LAB — C-REACTIVE PROTEIN: CRP: 3.7 mg/dL — ABNORMAL HIGH (ref <1.0)

## 2015-11-02 LAB — PREALBUMIN: PREALBUMIN: 14.6 mg/dL — AB (ref 18–38)

## 2015-11-02 LAB — SEDIMENTATION RATE: SED RATE: 135 mm/h — AB (ref 0–22)

## 2015-11-02 LAB — MAGNESIUM: Magnesium: 1.2 mg/dL — ABNORMAL LOW (ref 1.7–2.4)

## 2015-11-02 SURGERY — BYPASS GRAFT FEMORAL-POPLITEAL ARTERY
Anesthesia: General | Site: Leg Lower | Laterality: Left

## 2015-11-02 MED ORDER — 0.9 % SODIUM CHLORIDE (POUR BTL) OPTIME
TOPICAL | Status: DC | PRN
  Administered 2015-11-02: 2000 mL

## 2015-11-02 MED ORDER — PROPOFOL 10 MG/ML IV BOLUS
INTRAVENOUS | Status: AC
  Filled 2015-11-02: qty 20

## 2015-11-02 MED ORDER — MAGNESIUM HYDROXIDE 400 MG/5ML PO SUSP: 30.0000 mL | Freq: Every day | ORAL | Status: DC | PRN

## 2015-11-02 MED ORDER — PROTAMINE SULFATE 10 MG/ML IV SOLN
INTRAVENOUS | Status: AC
Start: 2015-11-02 — End: 2015-11-02
  Filled 2015-11-02: qty 5

## 2015-11-02 MED ORDER — HEPARIN SODIUM (PORCINE) 1000 UNIT/ML IJ SOLN
INTRAMUSCULAR | Status: AC
  Filled 2015-11-02: qty 1

## 2015-11-02 MED ORDER — POLYETHYLENE GLYCOL 3350 17 G PO PACK
17.0000 g | PACK | Freq: Every day | ORAL | Status: DC
  Administered 2015-11-02 – 2015-11-13 (×8): 17 g via ORAL
  Filled 2015-11-02 (×10): qty 1

## 2015-11-02 MED ORDER — THROMBIN 20000 UNITS EX SOLR
CUTANEOUS | Status: AC
  Filled 2015-11-02: qty 20000

## 2015-11-02 MED ORDER — MIDAZOLAM HCL 2 MG/2ML IJ SOLN
INTRAMUSCULAR | Status: AC
  Filled 2015-11-02: qty 2

## 2015-11-02 MED ORDER — MAGNESIUM SULFATE 2 GM/50ML IV SOLN: 2.0000 g | Freq: Every day | INTRAVENOUS | Status: DC | PRN

## 2015-11-02 MED ORDER — LIDOCAINE HCL (CARDIAC) 20 MG/ML IV SOLN
INTRAVENOUS | Status: DC | PRN
  Administered 2015-11-02: 100 mg via INTRAVENOUS

## 2015-11-02 MED ORDER — PHENYLEPHRINE HCL 10 MG/ML IJ SOLN
INTRAVENOUS | Status: DC | PRN
  Administered 2015-11-02: 20 ug/min via INTRAVENOUS

## 2015-11-02 MED ORDER — PRO-STAT SUGAR FREE PO LIQD
30.0000 mL | Freq: Two times a day (BID) | ORAL | Status: DC
  Administered 2015-11-03 – 2015-11-04 (×3): 30 mL via ORAL
  Filled 2015-11-02 (×5): qty 30

## 2015-11-02 MED ORDER — ROCURONIUM BROMIDE 100 MG/10ML IV SOLN
INTRAVENOUS | Status: DC | PRN
  Administered 2015-11-02: 50 mg via INTRAVENOUS

## 2015-11-02 MED ORDER — MORPHINE SULFATE (PF) 2 MG/ML IV SOLN
2.0000 mg | INTRAVENOUS | Status: DC | PRN
  Filled 2015-11-02: qty 1

## 2015-11-02 MED ORDER — HYDRALAZINE HCL 20 MG/ML IJ SOLN: 5.0000 mg | INTRAMUSCULAR | Status: DC | PRN

## 2015-11-02 MED ORDER — ONDANSETRON HCL 4 MG/2ML IJ SOLN: 4.0000 mg | Freq: Four times a day (QID) | INTRAMUSCULAR | Status: DC | PRN

## 2015-11-02 MED ORDER — DEXTROSE 50 % IV SOLN
INTRAVENOUS | Status: AC
  Filled 2015-11-02: qty 50

## 2015-11-02 MED ORDER — PHENYLEPHRINE 40 MCG/ML (10ML) SYRINGE FOR IV PUSH (FOR BLOOD PRESSURE SUPPORT)
PREFILLED_SYRINGE | INTRAVENOUS | Status: AC
  Filled 2015-11-02: qty 10

## 2015-11-02 MED ORDER — OXYCODONE HCL 5 MG PO TABS
5.0000 mg | ORAL_TABLET | ORAL | Status: DC | PRN
  Administered 2015-11-03 (×2): 10 mg via ORAL
  Filled 2015-11-02 (×2): qty 2

## 2015-11-02 MED ORDER — HEPARIN (PORCINE) IN NACL 100-0.45 UNIT/ML-% IJ SOLN
400.0000 [IU]/h | INTRAMUSCULAR | Status: DC
  Administered 2015-11-02: 400 [IU]/h via INTRAVENOUS

## 2015-11-02 MED ORDER — GUAIFENESIN-DM 100-10 MG/5ML PO SYRP
15.0000 mL | ORAL_SOLUTION | ORAL | Status: DC | PRN
  Filled 2015-11-02: qty 15

## 2015-11-02 MED ORDER — SUGAMMADEX SODIUM 200 MG/2ML IV SOLN
INTRAVENOUS | Status: AC
  Filled 2015-11-02: qty 2

## 2015-11-02 MED ORDER — IOPAMIDOL (ISOVUE-300) INJECTION 61%
INTRAVENOUS | Status: DC | PRN
  Administered 2015-11-02: 50 mL via INTRA_ARTERIAL

## 2015-11-02 MED ORDER — HEPARIN (PORCINE) IN NACL 100-0.45 UNIT/ML-% IJ SOLN
INTRAMUSCULAR | Status: AC
  Filled 2015-11-02: qty 250

## 2015-11-02 MED ORDER — LABETALOL HCL 5 MG/ML IV SOLN: 10.0000 mg | INTRAVENOUS | Status: DC | PRN

## 2015-11-02 MED ORDER — SUGAMMADEX SODIUM 200 MG/2ML IV SOLN
INTRAVENOUS | Status: DC | PRN
  Administered 2015-11-02: 150 mg via INTRAVENOUS

## 2015-11-02 MED ORDER — SODIUM CHLORIDE 0.9 % IV SOLN
INTRAVENOUS | Status: DC
  Administered 2015-11-02 – 2015-11-03 (×2): via INTRAVENOUS

## 2015-11-02 MED ORDER — METOPROLOL TARTRATE 5 MG/5ML IV SOLN: 2.0000 mg | INTRAVENOUS | Status: DC | PRN

## 2015-11-02 MED ORDER — SODIUM CHLORIDE 0.9 % IV SOLN: 500.0000 mL | Freq: Once | INTRAVENOUS | Status: DC | PRN

## 2015-11-02 MED ORDER — FENTANYL CITRATE (PF) 100 MCG/2ML IJ SOLN
INTRAMUSCULAR | Status: DC | PRN
  Administered 2015-11-02: 25 ug via INTRAVENOUS
  Administered 2015-11-02: 50 ug via INTRAVENOUS
  Administered 2015-11-02: 100 ug via INTRAVENOUS
  Administered 2015-11-02: 25 ug via INTRAVENOUS
  Administered 2015-11-02 (×2): 50 ug via INTRAVENOUS

## 2015-11-02 MED ORDER — ACETAMINOPHEN 325 MG RE SUPP
325.0000 mg | RECTAL | Status: DC | PRN
  Filled 2015-11-02: qty 2

## 2015-11-02 MED ORDER — DEXTROSE 50 % IV SOLN
25.0000 mL | Freq: Once | INTRAVENOUS | Status: AC
  Administered 2015-11-02: 25 mL via INTRAVENOUS

## 2015-11-02 MED ORDER — METOPROLOL SUCCINATE ER 50 MG PO TB24
50.0000 mg | ORAL_TABLET | Freq: Every day | ORAL | Status: DC
  Administered 2015-11-03: 50 mg via ORAL
  Filled 2015-11-02: qty 1

## 2015-11-02 MED ORDER — PAPAVERINE HCL 30 MG/ML IJ SOLN
INTRAMUSCULAR | Status: AC
  Filled 2015-11-02: qty 2

## 2015-11-02 MED ORDER — HEPARIN SODIUM (PORCINE) 1000 UNIT/ML IJ SOLN
INTRAMUSCULAR | Status: DC | PRN
  Administered 2015-11-02: 7000 [IU] via INTRAVENOUS
  Administered 2015-11-02: 2000 [IU] via INTRAVENOUS

## 2015-11-02 MED ORDER — FENTANYL CITRATE (PF) 100 MCG/2ML IJ SOLN
INTRAMUSCULAR | Status: AC
  Filled 2015-11-02: qty 2

## 2015-11-02 MED ORDER — IOPAMIDOL (ISOVUE-300) INJECTION 61%
INTRAVENOUS | Status: AC
  Filled 2015-11-02: qty 50

## 2015-11-02 MED ORDER — ASPIRIN EC 81 MG PO TBEC
81.0000 mg | DELAYED_RELEASE_TABLET | Freq: Every day | ORAL | Status: DC
  Administered 2015-11-03: 81 mg via ORAL
  Filled 2015-11-02: qty 1

## 2015-11-02 MED ORDER — ALUM & MAG HYDROXIDE-SIMETH 200-200-20 MG/5ML PO SUSP
15.0000 mL | ORAL | Status: DC | PRN
  Filled 2015-11-02: qty 30

## 2015-11-02 MED ORDER — HYDROMORPHONE HCL 1 MG/ML IJ SOLN: 0.2500 mg | INTRAMUSCULAR | Status: DC | PRN

## 2015-11-02 MED ORDER — DOCUSATE SODIUM 100 MG PO CAPS: 100.0000 mg | ORAL_CAPSULE | Freq: Every day | ORAL | Status: DC

## 2015-11-02 MED ORDER — LACTATED RINGERS IV SOLN
INTRAVENOUS | Status: DC
  Administered 2015-11-02 (×2): via INTRAVENOUS

## 2015-11-02 MED ORDER — MIDAZOLAM HCL 5 MG/5ML IJ SOLN
INTRAMUSCULAR | Status: DC | PRN
  Administered 2015-11-02 (×2): 1 mg via INTRAVENOUS

## 2015-11-02 MED ORDER — PROPOFOL 10 MG/ML IV BOLUS
INTRAVENOUS | Status: DC | PRN
  Administered 2015-11-02: 100 mg via INTRAVENOUS

## 2015-11-02 MED ORDER — POTASSIUM CHLORIDE CRYS ER 20 MEQ PO TBCR: 20.0000 meq | EXTENDED_RELEASE_TABLET | Freq: Every day | ORAL | Status: DC | PRN

## 2015-11-02 MED ORDER — ONDANSETRON HCL 4 MG/2ML IJ SOLN
INTRAMUSCULAR | Status: AC
  Filled 2015-11-02: qty 2

## 2015-11-02 MED ORDER — CLOPIDOGREL BISULFATE 75 MG PO TABS
75.0000 mg | ORAL_TABLET | Freq: Every day | ORAL | Status: DC
  Administered 2015-11-03: 75 mg via ORAL
  Filled 2015-11-02: qty 1

## 2015-11-02 MED ORDER — FENTANYL CITRATE (PF) 100 MCG/2ML IJ SOLN
INTRAMUSCULAR | Status: AC
  Filled 2015-11-02: qty 4

## 2015-11-02 MED ORDER — PROTAMINE SULFATE 10 MG/ML IV SOLN
INTRAVENOUS | Status: AC
  Filled 2015-11-02: qty 5

## 2015-11-02 MED ORDER — PAPAVERINE HCL 30 MG/ML IJ SOLN
INTRAMUSCULAR | Status: DC | PRN
  Administered 2015-11-02: 60 mg

## 2015-11-02 MED ORDER — SENNOSIDES-DOCUSATE SODIUM 8.6-50 MG PO TABS
1.0000 | ORAL_TABLET | Freq: Every day | ORAL | Status: DC
  Administered 2015-11-02 – 2015-11-13 (×10): 1 via ORAL
  Filled 2015-11-02 (×11): qty 1

## 2015-11-02 MED ORDER — BISACODYL 10 MG RE SUPP: 10.0000 mg | Freq: Every day | RECTAL | Status: DC | PRN

## 2015-11-02 MED ORDER — ACETAMINOPHEN 325 MG PO TABS
325.0000 mg | ORAL_TABLET | ORAL | Status: DC | PRN
  Administered 2015-11-06: 650 mg via ORAL
  Filled 2015-11-02: qty 2

## 2015-11-02 MED ORDER — LIDOCAINE 2% (20 MG/ML) 5 ML SYRINGE
INTRAMUSCULAR | Status: AC
  Filled 2015-11-02: qty 5

## 2015-11-02 MED ORDER — SODIUM CHLORIDE 0.9 % IV SOLN
INTRAVENOUS | Status: DC | PRN
  Administered 2015-11-02: 11:00:00

## 2015-11-02 MED ORDER — PROTAMINE SULFATE 10 MG/ML IV SOLN
INTRAVENOUS | Status: DC | PRN
  Administered 2015-11-02: 20 mg via INTRAVENOUS
  Administered 2015-11-02 (×4): 10 mg via INTRAVENOUS

## 2015-11-02 MED ORDER — PHENOL 1.4 % MT LIQD: 1.0000 | OROMUCOSAL | Status: DC | PRN

## 2015-11-02 MED ORDER — PHENYLEPHRINE HCL 10 MG/ML IJ SOLN
INTRAMUSCULAR | Status: DC | PRN
  Administered 2015-11-02 (×2): 40 ug via INTRAVENOUS
  Administered 2015-11-02 (×4): 80 ug via INTRAVENOUS

## 2015-11-02 MED ORDER — ONDANSETRON HCL 4 MG/2ML IJ SOLN
INTRAMUSCULAR | Status: DC | PRN
  Administered 2015-11-02: 4 mg via INTRAVENOUS

## 2015-11-02 MED ORDER — THROMBIN 20000 UNITS EX SOLR
CUTANEOUS | Status: DC | PRN
  Administered 2015-11-02: 15:00:00 via TOPICAL

## 2015-11-02 SURGICAL SUPPLY — 56 items
ADH SKN CLS APL DERMABOND .7 (GAUZE/BANDAGES/DRESSINGS) ×6
BANDAGE ESMARK 6X9 LF (GAUZE/BANDAGES/DRESSINGS) IMPLANT
BNDG CMPR 9X6 STRL LF SNTH (GAUZE/BANDAGES/DRESSINGS) ×2
BNDG ESMARK 6X9 LF (GAUZE/BANDAGES/DRESSINGS) ×4
CANISTER SUCTION 2500CC (MISCELLANEOUS) ×4 IMPLANT
CANNULA VESSEL 3MM 2 BLNT TIP (CANNULA) ×10 IMPLANT
CLIP TI MEDIUM 24 (CLIP) ×4 IMPLANT
CLIP TI WIDE RED SMALL 24 (CLIP) ×6 IMPLANT
CUFF TOURNIQUET SINGLE 24IN (TOURNIQUET CUFF) ×2 IMPLANT
DERMABOND ADVANCED (GAUZE/BANDAGES/DRESSINGS) ×6
DERMABOND ADVANCED .7 DNX12 (GAUZE/BANDAGES/DRESSINGS) IMPLANT
DRAIN CHANNEL 15F RND FF W/TCR (WOUND CARE) ×2 IMPLANT
DRAPE X-RAY CASS 24X20 (DRAPES) ×2 IMPLANT
ELECT REM PT RETURN 9FT ADLT (ELECTROSURGICAL) ×4
ELECTRODE REM PT RTRN 9FT ADLT (ELECTROSURGICAL) ×2 IMPLANT
EVACUATOR SILICONE 100CC (DRAIN) ×4 IMPLANT
GAUZE SPONGE 4X4 16PLY XRAY LF (GAUZE/BANDAGES/DRESSINGS) ×8 IMPLANT
GLOVE BIOGEL PI IND STRL 6.5 (GLOVE) IMPLANT
GLOVE BIOGEL PI IND STRL 7.0 (GLOVE) IMPLANT
GLOVE BIOGEL PI IND STRL 8 (GLOVE) ×2 IMPLANT
GLOVE BIOGEL PI INDICATOR 6.5 (GLOVE) ×6
GLOVE BIOGEL PI INDICATOR 7.0 (GLOVE) ×4
GLOVE BIOGEL PI INDICATOR 8 (GLOVE) ×2
GLOVE SURG SS PI 6.0 STRL IVOR (GLOVE) ×4 IMPLANT
GLOVE SURG SS PI 6.5 STRL IVOR (GLOVE) ×2 IMPLANT
GLOVE SURG SS PI 7.5 STRL IVOR (GLOVE) ×4 IMPLANT
GOWN STRL REUS W/ TWL LRG LVL3 (GOWN DISPOSABLE) ×6 IMPLANT
GOWN STRL REUS W/ TWL XL LVL3 (GOWN DISPOSABLE) IMPLANT
GOWN STRL REUS W/TWL LRG LVL3 (GOWN DISPOSABLE) ×16
GOWN STRL REUS W/TWL XL LVL3 (GOWN DISPOSABLE) ×4
KIT BASIN OR (CUSTOM PROCEDURE TRAY) ×4 IMPLANT
KIT ROOM TURNOVER OR (KITS) ×4 IMPLANT
MARKER GRAFT CORONARY BYPASS (MISCELLANEOUS) ×2 IMPLANT
NS IRRIG 1000ML POUR BTL (IV SOLUTION) ×8 IMPLANT
PACK PERIPHERAL VASCULAR (CUSTOM PROCEDURE TRAY) ×4 IMPLANT
PAD ARMBOARD 7.5X6 YLW CONV (MISCELLANEOUS) ×8 IMPLANT
SET COLLECT BLD 21X3/4 12 (NEEDLE) ×2 IMPLANT
SPONGE GAUZE 4X4 12PLY STER LF (GAUZE/BANDAGES/DRESSINGS) ×2 IMPLANT
SPONGE SURGIFOAM ABS GEL 100 (HEMOSTASIS) ×2 IMPLANT
STOPCOCK 4 WAY LG BORE MALE ST (IV SETS) ×2 IMPLANT
SUT PROLENE 5 0 C 1 24 (SUTURE) ×6 IMPLANT
SUT PROLENE 6 0 BV (SUTURE) ×24 IMPLANT
SUT SILK 2 0 FS (SUTURE) ×8 IMPLANT
SUT SILK 2 0 SH (SUTURE) ×4 IMPLANT
SUT SILK 3 0 (SUTURE) ×8
SUT SILK 3-0 18XBRD TIE 12 (SUTURE) IMPLANT
SUT VIC AB 2-0 CTB1 (SUTURE) ×8 IMPLANT
SUT VIC AB 3-0 SH 27 (SUTURE) ×20
SUT VIC AB 3-0 SH 27X BRD (SUTURE) ×4 IMPLANT
SUT VICRYL 4-0 PS2 18IN ABS (SUTURE) ×14 IMPLANT
SYR 30ML LL (SYRINGE) ×2 IMPLANT
TAPE CLOTH SURG 4X10 WHT LF (GAUZE/BANDAGES/DRESSINGS) ×2 IMPLANT
TRAY FOLEY BAG SILVER LF 16FR (SET/KITS/TRAYS/PACK) ×2 IMPLANT
TUBING EXTENTION W/L.L. (IV SETS) ×2 IMPLANT
UNDERPAD 30X30 (UNDERPADS AND DIAPERS) ×4 IMPLANT
WATER STERILE IRR 1000ML POUR (IV SOLUTION) ×4 IMPLANT

## 2015-11-02 NOTE — Progress Notes (Signed)
Unable to obtain doppler for pedal pulse on right foot.  Weak post tib pulse assessed by doppler.  Primary MD in room at time of doppler.  Will continue to monitor.

## 2015-11-02 NOTE — Progress Notes (Signed)
PROGRESS NOTE  [name] [name]  [name] [name] DOB: Jul 21, 1950 DOA: 10/30/2015 PCP: Georgann Housekeeper, MD Outpatient Specialists:  Subjective: Denies any fever or chills. No much pain on left foot  Brief Narrative:  [name] [name] is a 65 y.o. female with medical history significant of DM, PVD with recent revascularization, MI in 2011, HTN, HLD, and peripheral neuropathy presenting with a diabetic foot infection.  Patient is diabetic and developed a wound on the side of her foot.  Went to the doctor, [facility] ([facility]).  2 weeks ago, developed a blister on the side of her foot and it burst.  Didn't have transportation to the doctor and so missed several appointments.  When she made it there today, they were concerned about these ulcers and sent her to the ER for further evaluation.  Diabetes for about 14 years.  On insulin. Sugars run a little high, 200 range.  Prior A1c was 13, but down to 7 at last check recently.  Used to hceck glucose twice daily but not recently.  She was admitted from 8/31-09/29/15 for PAD and PVD with claudication; she had stenting of the distal left common femoral artery during that hospitalization.  She had previously had a staged left common femoral endarterectomy with profundoplasty on 09/05/15  Subjective:  Patient seen and examined in the PACU, patient very sleepy due to sedation. S/p Left femoral to below-knee popliteal artery bypass with non-reversed translocated left great saphenous vein. Nursing staff getting pulses with doppler. C/o constipation since "Sunday    Assessment & Plan:  Osteomyelitis of left foot including forefoot and possibly toes 3-5 -Patient with reported h/o foot infection with known PVD but multiple missed appointments to wound care center since revascularization procedures in Aug 2017. Patient is diabetic, but it's unclear if this is secondary to diabetic insensate neuropathy versus vascular disease. Last ABIs showed  ischemic range (ABI 0.3) on both feet. S/p Left femoral to below-knee popliteal artery bypass with non-reversed translocated left great saphenous vein. - Awaiting Ortho consult for evaluation of possible amputation.  - Cont heparin  - Monitor pedal pulses  - Abx management per ID - stopped to follow cultures from procedure  - MRI reviewed  Diabetes with peripheral neuropathy -Uncontrolled, hemoglobin A1c 8.3 in August 2017. -Continue Lantus but hold Metformin -Cover with SSI  HLD -Continue Lipitor 80mg  HTN -Continue Norvasc, Cozaar, Toprol.  Constipation  - Miralax  - Senakote   DVT prophylaxis: Heparin  Code Status: Full Code Family Communication:  None at bedside Disposition Plan: Discharge when medically improving   Consultants:   Dickson  Procedures:   None  Antimicrobials:   Vancomycin and aztreonam  Objective: Vitals:   11/02/15 1700 11/02/15 1710 11/02/15 1715 11/02/15 1725  BP:  115/65  (!) 120/43  Pulse: 75 77 70 74  Resp: (!) 22 (!) 24 18 (!) 21  Temp:    97.9 F (36.6 C)  TempSrc:      SpO2: 99% 98% 99% 100%  Weight:      Height:        Intake/Output Summary (Last 24 hours) at 11/02/15 1818 Last data filed at 11/02/15 1637  Gross per 24 hour  Intake             2600 ml  Output             1470 ml  Net             11" 30  ml   Filed Weights   10/30/15 1644  Weight: 72.6 kg (160 lb)    Examination: General exam: Appears calm and comfortable  Respiratory system: Clear to auscultation. Respiratory effort normal. Cardiovascular system: S1 & S2 heard, RRR. No JVD, murmurs, rubs, gallops or clicks. No pedal edema. Gastrointestinal system: Abdomen is nondistended, soft and nontender. No organomegaly or masses felt.  Central nervous system: Alert and oriented. No focal neurological deficits. Extremities: Symmetric, left knee difficult ROM due to pain Skin: Ulcerated dry eschar lesion at the Left lateral foot, and mid forefoot, erythema  improving  Psychiatry: Judgement and insight appear normal. Mood & affect appropriate.   Data Reviewed: I have personally reviewed following labs and imaging studies  CBC:  Recent Labs Lab 10/30/15 1930 10/31/15 0615 11/01/15 0647 11/02/15 0330 11/02/15 1536  WBC 13.3* 10.1 8.9 9.9 19.8*  NEUTROABS 10.0*  --   --   --   --   HGB 10.5* 9.7* 8.9* 8.3* 8.2*  HCT 31.2* 30.1* 27.6* 25.1* 25.7*  MCV 85.0 85.3 84.9 84.2 85.7  PLT 445* 364 342 339 339   Basic Metabolic Panel:  Recent Labs Lab 10/30/15 1930 10/31/15 0615 11/01/15 0647 11/02/15 0330  NA 137 139 138 138  K 3.7 4.1 4.3 3.7  CL 103 104 103 104  CO2 26 28 27 27   GLUCOSE 172* 149* 120* 133*  BUN 10 6 13 13   CREATININE 0.56 0.57 0.71 0.77  CALCIUM 8.6* 8.7* 8.7* 8.5*   GFR: Estimated Creatinine Clearance: 68.5 mL/min (by C-G formula based on SCr of 0.77 mg/dL). Liver Function Tests: No results for input(s): AST, ALT, ALKPHOS, BILITOT, PROT, ALBUMIN in the last 168 hours. No results for input(s): LIPASE, AMYLASE in the last 168 hours. No results for input(s): AMMONIA in the last 168 hours. Coagulation Profile:  Recent Labs Lab 11/02/15 0330  INR 1.22   Cardiac Enzymes: No results for input(s): CKTOTAL, CKMB, CKMBINDEX, TROPONINI in the last 168 hours. BNP (last 3 results) No results for input(s): PROBNP in the last 8760 hours. HbA1C: No results for input(s): HGBA1C in the last 72 hours. CBG:  Recent Labs Lab 11/02/15 0631 11/02/15 0840 11/02/15 1512 11/02/15 1625 11/02/15 1754  GLUCAP 98 107* 63* 94 90   Lipid Profile: No results for input(s): CHOL, HDL, LDLCALC, TRIG, CHOLHDL, LDLDIRECT in the last 72 hours. Thyroid Function Tests: No results for input(s): TSH, T4TOTAL, FREET4, T3FREE, THYROIDAB in the last 72 hours. Anemia Panel: No results for input(s): VITAMINB12, FOLATE, FERRITIN, TIBC, IRON, RETICCTPCT in the last 72 hours. Urine analysis:    Component Value Date/Time   COLORURINE  YELLOW 09/05/2015 0641   APPEARANCEUR CLEAR 09/05/2015 0641   LABSPEC 1.013 09/05/2015 0641   PHURINE 6.0 09/05/2015 0641   GLUCOSEU 100 (A) 09/05/2015 0641   HGBUR TRACE (A) 09/05/2015 0641   BILIRUBINUR NEGATIVE 09/05/2015 0641   KETONESUR NEGATIVE 09/05/2015 0641   PROTEINUR 100 (A) 09/05/2015 0641   UROBILINOGEN 1.0 10/16/2009 0243   NITRITE NEGATIVE 09/05/2015 0641   LEUKOCYTESUR NEGATIVE 09/05/2015 0641   Sepsis Labs: @LABRCNTIP (procalcitonin:4,lacticidven:4)  ) Recent Results (from the past 240 hour(s))  Surgical pcr screen     Status: None   Collection Time: 10/31/15  2:02 AM  Result Value Ref Range Status   MRSA, PCR NEGATIVE NEGATIVE Final   Staphylococcus aureus NEGATIVE NEGATIVE Final    Comment:        The Xpert SA Assay (FDA approved for NASAL specimens in patients over 65 years  of age), is one component of a comprehensive surveillance program.  Test performance has been validated by [facility] for patients greater than or equal to 65 year old. It is not intended to diagnose infection nor to guide or monitor treatment.      Invalid input(s): PROCALCITONIN, LACTICACIDVEN   Radiology Studies: Mr Foot Left W Wo Contrast  Result Date: 11/02/2015 CLINICAL DATA:  Diabetic foot ulcer, assessment for osteomyelitis. EXAM: MRI OF THE LEFT FOREFOOT WITHOUT AND WITH CONTRAST TECHNIQUE: Multiplanar, multisequence MR imaging was performed both before and after administration of intravenous contrast. CONTRAST:  15mL MULTIHANCE GADOBENATE DIMEGLUMINE 529 MG/ML IV SOLN COMPARISON:  10/30/2015 FINDINGS: Osteomyelitis protocol MRI of the foot was obtained, to include the entire foot and ankle. This protocol uses a large field of view to cover the entire foot and ankle, and is suitable for assessing bony structures for osteomyelitis. Due to the large field of view and imaging plane choice, this protocol is less sensitive for assessing small structures such as ligamentous  structures of the foot and ankle, compared to a dedicated forefoot or dedicated hindfoot exam. Despite efforts by the technologist and patient, motion artifact is present on today's exam and could not be eliminated. This reduces exam sensitivity and specificity. Abnormal edema and enhancement involving the distal head and metaphysis of the fifth metatarsal and adjacent proximal half of the small toe proximal phalanx compatible with osteomyelitis. Lateral ulceration along the fifth MTP joint probably communicates with the joint on image 33/6. Extensive subcutaneous edema along the plantar foot at the level of the midfoot and forefoot, although I do not see a drainable abscess this may well be spontaneously draining to the plantar foot given the overlying skin irregularity. There is edema and likely some enhancement along the plantar foot musculature. Thickened proximal plantar fascia. Dorsal subcutaneous edema in the foot. IMPRESSION: 1. Ulceration lateral to the fifth MTP joint appears to extend all the way down into the joint. There is osteomyelitis distally in the fifth metatarsal and also in the proximal phalanx of the fifth toe. No other osteomyelitis in the foot is identified. 2. Suspected ulcerations along the plantar foot with confluent phlegmon tracking in the plantar foot, primarily within the subcutaneous tissues but also along the deep margin of the medial band of the plantar fascia. I suspected any early abscess in this region is spontaneously draining. 3. Cellulitis along the dorsum of the foot. Electronically Signed   By: Gaylyn RongWalter  Liebkemann M.D.   On: 11/02/2015 07:31   Dg Ang/ext/uni/or Left  Result Date: 11/02/2015 CLINICAL DATA:  65 year old female undergoing distal bypass EXAM: LEFT ANG/EXT/UNI/ OR COMPARISON:  Prior intraoperative angiographic images 8 09/17/2015 FINDINGS: Single cross-table lateral intraoperative image demonstrates what appears to be a patent saphenous vein bypass graft  attaching to the distal popliteal artery below the knee joint. The popliteal artery is diffusely diseased. Primary runoff to the ankle is via the peroneal artery. The anterior tibial artery is occluded proximally but reconstitutes distally. The posterior tibial artery is occluded. IMPRESSION: Intraoperative arteriogram as above. Electronically Signed   By: Malachy MoanHeath  McCullough M.D.   On: 11/02/2015 14:26    Scheduled Meds: . amLODipine  5 mg Oral Daily  . [START ON 11/03/2015] aspirin EC  81 mg Oral Daily  . atorvastatin  80 mg Oral Daily  . [START ON 11/03/2015] clopidogrel  75 mg Oral Daily  . dextrose      . [START ON 11/03/2015] docusate sodium  100 mg Oral Daily  .  feeding supplement (GLUCERNA SHAKE)  237 mL Oral BID BM  . [START ON 11/03/2015] feeding supplement (PRO-STAT SUGAR FREE 64)  30 mL Oral BID  . gabapentin  300 mg Oral TID  . heparin      . insulin aspart  0-15 Units Subcutaneous Q4H  . insulin glargine  35 Units Subcutaneous Q2200  . isosorbide mononitrate  90 mg Oral Daily  . losartan  100 mg Oral Daily  . [START ON 11/03/2015] metoprolol succinate  50 mg Oral Daily  . pantoprazole  40 mg Oral Daily  . vancomycin  750 mg Intravenous Q12H   Continuous Infusions: . sodium chloride    . heparin 400 Units/hr (11/02/15 1530)     LOS: 3 days    Latrelle Dodrill, MD [facility] Pager [phone]  If 7PM-7AM, please contact night-coverage [email or web address] Password [email or web address] 11/02/2015, 6:18 PM

## 2015-11-02 NOTE — Progress Notes (Addendum)
Inpatient Diabetes Program Recommendations  AACE/ADA: New Consensus Statement on Inpatient Glycemic Control (2015)  Target Ranges:  Prepandial:   less than 140 mg/dL      Peak postprandial:   less than 180 mg/dL (1-2 hours)      Critically ill patients:  140 - 180 mg/dL   Lab Results  Component Value Date   GLUCAP 107 (H) 11/02/2015   HGBA1C 8.3 (H) 09/01/2015    Review of Glycemic Control  Results for SOLEA, OLP (MRN [id number]) as of 11/02/2015 10:15  Ref. Range 11/01/2015 20:26 11/02/2015 00:03 11/02/2015 05:32 11/02/2015 06:31 11/02/2015 08:40  Glucose-Capillary Latest Ref Range: 65 - 99 mg/dL 121 (H) 975 (H) 883 (H) 98 107 (H)   Diabetes consult confirmed.  Diabetes history: Type 2 Outpatient Diabetes medications: Metformin 1000mg  bid, Lantus 35 units qhs Current orders for Inpatient glycemic control: Lantus 35 units qhs, Novolog 0-15 units q4h   Inpatient Diabetes Program Recommendations:   Agree with current orders for blood sugar management  Susette Racer, RN, [place], [place], CDE Diabetes Coordinator Inpatient Diabetes Program  [phone] (Team Pager) [phone] [facility]) 11/02/2015 10:17 AM

## 2015-11-02 NOTE — Transfer of Care (Signed)
Immediate Anesthesia Transfer of Care Note  Patient: [name] [name]  Procedure(s) Performed: Procedure(s): BYPASS GRAFT FEMORAL-POPLITEAL ARTERY VS FEMORAL-TIBIAL ARTERY BYPASS (Left) LEFT GREATER SAPHENOUS VEIN HARVEST (Left) INTRA OPERATIVE ARTERIOGRAM (Left)  Patient Location: PACU  Anesthesia Type:General  Level of Consciousness: awake, oriented and patient cooperative  Airway & Oxygen Therapy: Patient Spontanous Breathing and Patient connected to nasal cannula oxygen  Post-op Assessment: Report given to RN and Post -op Vital signs reviewed and stable  Post vital signs: stable  Last Vitals:  Vitals:   11/01/15 1455 11/01/15 1945  BP: (!) 97/37 120/60  Pulse: 76 78  Resp: 16 16  Temp: 36.7 C 36.8 C    Last Pain:  Vitals:   11/02/15 0025  TempSrc:   PainSc: Asleep      Patients Stated Pain Goal: 3 (11/01/15 2325)  Complications: No apparent anesthesia complications

## 2015-11-02 NOTE — H&P (View-Only) (Signed)
   VASCULAR SURGERY ASSESSMENT & PLAN:  I plan to proceed with left lower extremity bypass tomorrow if okay with the medical service. Her vein mapping is pending. I think without revascularization she would require a below the knee amputation. Even with revascularization there is risk of limb loss given the extent of the wound on her foot.  I reviewed her previous arteriogram which shows occlusion of the superficial femoral artery and reconstitution of a diseased below-knee popliteal artery. There is peroneal runoff on the left and also possibly an anterior tibial artery which is poorly visualized.  I have reviewed the indications for lower extremity bypass. I have also reviewed the potential complications of surgery including but not limited to: wound healing problems, infection, graft thrombosis, limb loss, or other unpredictable medical problems. All the patient's questions were answered and they are agreeable to proceed.  SUBJECTIVE: No specific complaints.  PHYSICAL EXAM: Vitals:   10/31/15 1300 10/31/15 2132 11/01/15 0500 11/01/15 0949  BP: (!) 168/60 (!) 125/53 (!) 144/55 (!) 149/68  Pulse: 80 81 85 77  Resp: 16 18    Temp: 98 F (36.7 C) 98.3 F (36.8 C) 98.2 F (36.8 C)   TempSrc: Oral Oral Oral   SpO2: 98% 98% 99%   Weight:      Height:       Palpable left femoral pulse.  LABS: Lab Results  Component Value Date   WBC 8.9 11/01/2015   HGB 8.9 (L) 11/01/2015   HCT 27.6 (L) 11/01/2015   MCV 84.9 11/01/2015   PLT 342 11/01/2015   Lab Results  Component Value Date   CREATININE 0.71 11/01/2015   Lab Results  Component Value Date   INR 1.1 09/19/2015   CBG (last 3)   Recent Labs  11/01/15 0521 11/01/15 0758 11/01/15 1110  GLUCAP 106* 136* 232*    Principal Problem:   Osteomyelitis (HCC) Active Problems:   Diabetes mellitus type II, uncontrolled (HCC)   Hyperlipidemia with target LDL less than 70   PVD (peripheral vascular disease) with claudication  [facility])   Essential hypertension    Cari Caraway Beeper: [phone] 11/01/2015

## 2015-11-02 NOTE — Progress Notes (Signed)
Nutrition Follow-up  DOCUMENTATION CODES:   Non-severe (moderate) malnutrition in context of acute illness/injury  INTERVENTION:  Once diet advances,   Continue Glucerna Shake po BID, each supplement provides 220 kcal and 10 grams of protein.  Provide 30 ml Prostat po BID, each supplement provides 100 kcal and 15 grams of protein.   NUTRITION DIAGNOSIS:   Inadequate oral intake related to poor appetite as evidenced by per patient/family report; ongoing  GOAL:   Patient will meet greater than or equal to 90% of their needs; not met  MONITOR:   PO intake, Supplement acceptance, Labs, Weight trends, Skin, I & O's  REASON FOR ASSESSMENT:   Malnutrition Screening Tool    ASSESSMENT:   65 y.o. female with medical history significant of DM, PVD with recent revascularization, MI in 2011, HTN, HLD, and peripheral neuropathy presenting with a diabetic foot infection.  Pt in OR during attempted time of visit. RD consulted for wound healing. Meal completion prior to NPO status has been 75%. Pt currently has Glucerna Shake ordered. RD to additionally order Prostat to aid in wound healing. RN to provide once diet advances.   Labs and medications reviewed.   Diet Order:  Diet NPO time specified Except for: Sips with Meds  Skin:  Wound (see comment) (Diabetic ulcer L foot)  Last BM:  10/1  Height:   Ht Readings from Last 1 Encounters:  10/30/15 5' 4"  (1.626 m)    Weight:   Wt Readings from Last 1 Encounters:  10/30/15 160 lb (72.6 kg)    Ideal Body Weight:  54.5 kg  BMI:  Body mass index is 27.46 kg/m.  Estimated Nutritional Needs:   Kcal:  1800-1950  Protein:  85-95 grams  Fluid:  1.8 - 2 L/day  EDUCATION NEEDS:   No education needs identified at this time  Corrin Parker, MS, RD, LDN Pager # [phone] After hours/ weekend pager # [phone]

## 2015-11-02 NOTE — Interval H&P Note (Signed)
History and Physical Interval Note:  11/02/2015 9:24 AM  [name]  has presented today for surgery, with the diagnosis of Aortoiliac Occlusive Disease Diabetic Infection left foot  The various methods of treatment have been discussed with the patient and family. After consideration of risks, benefits and other options for treatment, the patient has consented to  Procedure(s): BYPASS GRAFT FEMORAL-POPLITEAL ARTERY VS FEMORAL-TIBIAL ARTERY BYPASS (Left) as a surgical intervention .  The patient's history has been reviewed, patient examined, no change in status, stable for surgery.  I have reviewed the patient's chart and labs.  Questions were answered to the patient's satisfaction.     [name] [name]

## 2015-11-02 NOTE — Telephone Encounter (Signed)
-----   Message from Sharee Pimple, RN sent at 11/02/2015  3:28 PM EDT ----- Regarding: schedule   ----- Message ----- From: Dara Lords, PA-C Sent: 11/02/2015   2:51 PM To: [facility] Charge Pool  S/p left fem pop bypass 11/02/15.  F/u with Dr. Edilia Bo in 2 weeks.

## 2015-11-02 NOTE — Anesthesia Preprocedure Evaluation (Addendum)
Anesthesia Evaluation  Patient identified by MRN, date of birth, ID band Patient awake    Reviewed: Allergy & Precautions, H&P , NPO status , Patient's Chart, lab work & pertinent test results, reviewed documented beta blocker date and time   Airway Mallampati: II  TM Distance: >3 FB Neck ROM: Full    Dental no notable dental hx. (+) Partial Upper, Dental Advisory Given   Pulmonary neg pulmonary ROS, former smoker,    Pulmonary exam normal breath sounds clear to auscultation       Cardiovascular hypertension, Pt. on medications and Pt. on home beta blockers + CAD, + Past MI and + Peripheral Vascular Disease   Rhythm:Regular Rate:Normal     Neuro/Psych  Headaches, negative psych ROS   GI/Hepatic Neg liver ROS, GERD  Medicated and Controlled,  Endo/Other  diabetes, Insulin Dependent  Renal/GU negative Renal ROS  negative genitourinary   Musculoskeletal  (+) Arthritis , Osteoarthritis,    Abdominal   Peds  Hematology negative hematology ROS (+)   Anesthesia Other Findings   Reproductive/Obstetrics negative OB ROS                            Anesthesia Physical Anesthesia Plan  ASA: III  Anesthesia Plan: General   Post-op Pain Management:    Induction: Intravenous  Airway Management Planned: Oral ETT  Additional Equipment:   Intra-op Plan:   Post-operative Plan: Extubation in OR  Informed Consent: I have reviewed the patients History and Physical, chart, labs and discussed the procedure including the risks, benefits and alternatives for the proposed anesthesia with the patient or authorized representative who has indicated his/her understanding and acceptance.   Dental advisory given  Plan Discussed with: CRNA  Anesthesia Plan Comments:         Anesthesia Quick Evaluation

## 2015-11-02 NOTE — Anesthesia Procedure Notes (Signed)
Procedure Name: Intubation Date/Time: 11/02/2015 9:56 AM Performed by: Lovie Chol Pre-anesthesia Checklist: Patient identified, Emergency Drugs available, Suction available and Patient being monitored Patient Re-evaluated:Patient Re-evaluated prior to inductionOxygen Delivery Method: Circle System Utilized Preoxygenation: Pre-oxygenation with 100% oxygen Intubation Type: IV induction Ventilation: Mask ventilation without difficulty Grade View: Grade I Tube type: Oral Tube size: 7.0 mm Number of attempts: 1 Airway Equipment and Method: Stylet and Oral airway Placement Confirmation: ETT inserted through vocal cords under direct vision,  positive ETCO2 and breath sounds checked- equal and bilateral Secured at: 21 cm Tube secured with: Tape Dental Injury: Teeth and Oropharynx as per pre-operative assessment

## 2015-11-02 NOTE — Anesthesia Postprocedure Evaluation (Signed)
Anesthesia Post Note  Patient: [name] [name]  Procedure(s) Performed: Procedure(s) (LRB): BYPASS GRAFT FEMORAL-POPLITEAL ARTERY VS FEMORAL-TIBIAL ARTERY BYPASS (Left) LEFT GREATER SAPHENOUS VEIN HARVEST (Left) INTRA OPERATIVE ARTERIOGRAM (Left)  Patient location during evaluation: PACU Anesthesia Type: General Level of consciousness: awake and alert Pain management: pain level controlled Vital Signs Assessment: post-procedure vital signs reviewed and stable Respiratory status: spontaneous breathing, nonlabored ventilation and respiratory function stable Cardiovascular status: blood pressure returned to baseline and stable Postop Assessment: no signs of nausea or vomiting Anesthetic complications: no    Last Vitals:  Vitals:   11/02/15 1600 11/02/15 1610  BP:  (!) 127/46  Pulse: 72 74  Resp: 20 (!) 22  Temp:      Last Pain:  Vitals:   11/02/15 1545  TempSrc:   PainSc: 0-No pain                 [name],W. EDMOND

## 2015-11-02 NOTE — Op Note (Signed)
NAME: [name] [name]    MRN: [id number] DOB: [birth year]    DATE OF OPERATION: 11/02/2015  PREOP DIAGNOSIS: Critical limb ischemia left lower extremity  POSTOP DIAGNOSIS: Same  PROCEDURE:  1. Left femoral to below-knee popliteal artery bypass with non-reversed translocated left great saphenous vein. 2. Intraoperative arteriogram  SURGEON: Di Kindle. Edilia Bo, MD, FACS  ASSIST: Doreatha Massed, PA  ANESTHESIA: Gen.   EBL: Per anesthesia record  INDICATIONS: [name] [name] is a 65 y.o. female who presents with a diabetic foot infection and known infrainguinal arterial occlusive disease. It was felt that her only option for limb salvage was attempted revascularization. She's had a previous common femoral artery endarterectomy and profundoplasty and also proximal angioplasty and stenting by Dr. Nanetta Batty.  FINDINGS: The vein was somewhat small but I was reluctant to use a prosthetic graft in the face of her extensive foot infection. At the completion of the procedure she had an excellent anterior tibial signal with the Doppler. She had excellent diastolic flow below her bypass graft.  TECHNIQUE: The patient was taken to the operating room and received a general anesthetic. The left lower extremity was prepped and draped in usual sterile fashion.  The previous oblique incision in the left groin was opened and the dissection carried down through dense scar tissue to the vein patch. The common femoral artery and deep femoral artery had been patched. I was able to control the common femoral artery below the stent which was fairly low in the external iliac artery on the left. I then controlled the femoral artery and superficial femoral artery I do not that these could be clamped.   Next, a longitudinal incision was made along the medial aspect of the left leg. Here the saphenous vein was somewhat small but I dissected it free proximally and distally with branches divided between clips and 3-0  silk ties. Through this incision the below-knee popliteal artery was exposed. The artery was markedly calcified. However I dissected very high up on the below-knee popliteal artery to the popliteal fossa where there was an area that was soft and I could so at this level. I felt that this was her only option for a bypass.  Using 3 additional incisions along the medial aspect of the left leg the great saphenous vein was harvested up to the saphenofemoral junction. There was extensive scarring around the vein where I previously exposed the vein from her previous surgery. The vein was ligated distally and then irrigated up with my sailing and it was approximate 3.5 mm vein. I was reluctant to use prosthetic graft given her extensive foot infection. The saphenofemoral junction was ligated with a 2-0 silk tie.  A tunnel was created from the below the knee incision to the groin incision the patient was heparinized. Next clamps were placed on the common femoral artery, deep femoral and superficial femoral arteries and a longitudinal venotomy made in the vein patch. The vein was sewn in a non-reverse fashion into side to the patch after the proximal vein was spatulated. Prior to completing the anastomosis the artery was backbled and flushed properly and there was excellent inflow.  The vein was then irrigated with heparinized sailing and then a retrograde Mills valvulotome used to cut the valves. Excellent flow was established to the vein. The vein was then marked with a twisting and brought through the previously created tunnel for anastomosis to the very high below-knee popliteal artery. Tourniquet was placed on the thigh. The leg  was exsanguinated with an Esmarch bandage and the chart inflated to 300 mmHg. Under tourniquet control a longitudinal arteriotomy was made in the below-knee popliteal artery. The vein became very small and fortunately I was able to excise this segment and only use the larger part of the  vein.  The vein was sewn end to side to the high below-knee popliteal artery using continuous 6-0 Prolene suture. Prior to completing this anastomosis the tourniquet was released. The artery was backbled and flushed appropriately and anastomosis completed. Flow was established to the left leg. Intraoperative arteriogram was obtained which showed spasm and disease below the anastomosis but there were no other options for revascularization given her diffuse calcific disease.   The heparin was partially reversed with protamine. 15 Blake drains were placed in the groin incision the proximal thigh incision and the below the knee incision. The vein harvest incisions were closed with deep 3-0 Vicryl and skin closed with 4-0 Vicryl after hemostasis obtained. The below the knee incision was irrigated and hemostasis obtained. He was excellent flow below the graft with diastolic flow. This wound was closed with deep layer 2-0 Vicryl, a subcutaneous layer of 3-0 Vicryl, and the skin closed with 4-0 Vicryl. The groin incision was irrigated and hemostasis obtained. A radiopaque marker was placed around the proximal anastomosis. The wound was closed with deep 2-0 Vicryl, subcutaneous layer of 3-0 Vicryl, and the skin closed with 4-0 Vicryl. Sterile dressings were applied. The patient tolerated the procedure well and was transferred to the recovery room in stable condition. All needle and sponge counts were correct.  [name] Ferrarihristopher Willow Shidler, MD, FACS [facility]  DATE OF DICTATION:   11/02/2015

## 2015-11-02 NOTE — Telephone Encounter (Signed)
VM full, letter mailed for post op appt on 10/18

## 2015-11-03 ENCOUNTER — Inpatient Hospital Stay (HOSPITAL_COMMUNITY): Payer: Medicare Other

## 2015-11-03 ENCOUNTER — Encounter (HOSPITAL_COMMUNITY): Payer: Self-pay | Admitting: Vascular Surgery

## 2015-11-03 DIAGNOSIS — R579 Shock, unspecified: Secondary | ICD-10-CM

## 2015-11-03 DIAGNOSIS — M869 Osteomyelitis, unspecified: Secondary | ICD-10-CM

## 2015-11-03 DIAGNOSIS — A419 Sepsis, unspecified organism: Secondary | ICD-10-CM | POA: Diagnosis not present

## 2015-11-03 DIAGNOSIS — I639 Cerebral infarction, unspecified: Secondary | ICD-10-CM | POA: Diagnosis not present

## 2015-11-03 DIAGNOSIS — R6521 Severe sepsis with septic shock: Secondary | ICD-10-CM

## 2015-11-03 DIAGNOSIS — M86172 Other acute osteomyelitis, left ankle and foot: Secondary | ICD-10-CM

## 2015-11-03 DIAGNOSIS — G9340 Encephalopathy, unspecified: Secondary | ICD-10-CM | POA: Diagnosis present

## 2015-11-03 DIAGNOSIS — Z794 Long term (current) use of insulin: Secondary | ICD-10-CM

## 2015-11-03 DIAGNOSIS — L02612 Cutaneous abscess of left foot: Secondary | ICD-10-CM

## 2015-11-03 DIAGNOSIS — E1142 Type 2 diabetes mellitus with diabetic polyneuropathy: Secondary | ICD-10-CM

## 2015-11-03 DIAGNOSIS — Z95828 Presence of other vascular implants and grafts: Secondary | ICD-10-CM

## 2015-11-03 DIAGNOSIS — J9601 Acute respiratory failure with hypoxia: Secondary | ICD-10-CM | POA: Diagnosis present

## 2015-11-03 DIAGNOSIS — D62 Acute posthemorrhagic anemia: Secondary | ICD-10-CM | POA: Diagnosis not present

## 2015-11-03 DIAGNOSIS — R410 Disorientation, unspecified: Secondary | ICD-10-CM

## 2015-11-03 DIAGNOSIS — E1165 Type 2 diabetes mellitus with hyperglycemia: Secondary | ICD-10-CM

## 2015-11-03 LAB — BASIC METABOLIC PANEL
ANION GAP: 6 (ref 5–15)
ANION GAP: 8 (ref 5–15)
BUN: 12 mg/dL (ref 6–20)
BUN: 14 mg/dL (ref 6–20)
CALCIUM: 8.1 mg/dL — AB (ref 8.9–10.3)
CALCIUM: 7.8 mg/dL — AB (ref 8.9–10.3)
CO2: 25 mmol/L (ref 22–32)
CO2: 20 mmol/L — AB (ref 22–32)
CREATININE: 0.62 mg/dL (ref 0.44–1.00)
CREATININE: 0.64 mg/dL (ref 0.44–1.00)
Chloride: 108 mmol/L (ref 101–111)
Chloride: 109 mmol/L (ref 101–111)
GLUCOSE: 81 mg/dL (ref 65–99)
Glucose, Bld: 114 mg/dL — ABNORMAL HIGH (ref 65–99)
Potassium: 4.2 mmol/L (ref 3.5–5.1)
Potassium: 4.3 mmol/L (ref 3.5–5.1)
SODIUM: 139 mmol/L (ref 135–145)
SODIUM: 137 mmol/L (ref 135–145)

## 2015-11-03 LAB — GLUCOSE, CAPILLARY
GLUCOSE-CAPILLARY: 107 mg/dL — AB (ref 65–99)
GLUCOSE-CAPILLARY: 117 mg/dL — AB (ref 65–99)
GLUCOSE-CAPILLARY: 241 mg/dL — AB (ref 65–99)
GLUCOSE-CAPILLARY: 244 mg/dL — AB (ref 65–99)
GLUCOSE-CAPILLARY: 62 mg/dL — AB (ref 65–99)
GLUCOSE-CAPILLARY: 82 mg/dL (ref 65–99)
Glucose-Capillary: 90 mg/dL (ref 65–99)
Glucose-Capillary: 189 mg/dL — ABNORMAL HIGH (ref 65–99)
Glucose-Capillary: 108 mg/dL — ABNORMAL HIGH (ref 65–99)

## 2015-11-03 LAB — POCT I-STAT 3, ART BLOOD GAS (G3+)
ACID-BASE DEFICIT: 4 mmol/L — AB (ref 0.0–2.0)
BICARBONATE: 19.9 mmol/L — AB (ref 20.0–28.0)
O2 Saturation: 95 %
TCO2: 21 mmol/L (ref 0–100)
pCO2 arterial: 32.3 mmHg (ref 32.0–48.0)
pH, Arterial: 7.4 (ref 7.350–7.450)
pO2, Arterial: 74 mmHg — ABNORMAL LOW (ref 83.0–108.0)

## 2015-11-03 LAB — HEPATIC FUNCTION PANEL
ALT: 13 U/L — ABNORMAL LOW (ref 14–54)
AST: 31 U/L (ref 15–41)
Albumin: 1.6 g/dL — ABNORMAL LOW (ref 3.5–5.0)
Alkaline Phosphatase: 85 U/L (ref 38–126)
BILIRUBIN INDIRECT: 0.6 mg/dL (ref 0.3–0.9)
Bilirubin, Direct: 0.1 mg/dL (ref 0.1–0.5)
TOTAL PROTEIN: 4.8 g/dL — AB (ref 6.5–8.1)
Total Bilirubin: 0.7 mg/dL (ref 0.3–1.2)

## 2015-11-03 LAB — CBC
HCT: 18 % — ABNORMAL LOW (ref 36.0–46.0)
HEMATOCRIT: 27.9 % — AB (ref 36.0–46.0)
HEMOGLOBIN: 5.8 g/dL — AB (ref 12.0–15.0)
Hemoglobin: 9.2 g/dL — ABNORMAL LOW (ref 12.0–15.0)
MCH: 28 pg (ref 26.0–34.0)
MCH: 28.7 pg (ref 26.0–34.0)
MCHC: 32.2 g/dL (ref 30.0–36.0)
MCHC: 33 g/dL (ref 30.0–36.0)
MCV: 87 fL (ref 78.0–100.0)
MCV: 86.9 fL (ref 78.0–100.0)
PLATELETS: 342 10*3/uL (ref 150–400)
Platelets: 369 10*3/uL (ref 150–400)
RBC: 2.07 MIL/uL — AB (ref 3.87–5.11)
RBC: 3.21 MIL/uL — ABNORMAL LOW (ref 3.87–5.11)
RDW: 15.3 % (ref 11.5–15.5)
RDW: 14.1 % (ref 11.5–15.5)
WBC: 17.4 10*3/uL — AB (ref 4.0–10.5)
WBC: 18 10*3/uL — ABNORMAL HIGH (ref 4.0–10.5)

## 2015-11-03 LAB — PROTIME-INR
INR: 1.22
Prothrombin Time: 15.5 seconds — ABNORMAL HIGH (ref 11.4–15.2)

## 2015-11-03 LAB — BLOOD GAS, ARTERIAL
Acid-base deficit: 3.2 mmol/L — ABNORMAL HIGH (ref 0.0–2.0)
Bicarbonate: 20.2 mmol/L (ref 20.0–28.0)
O2 CONTENT: 3 L/min
O2 SAT: 94.1 %
PATIENT TEMPERATURE: 98.6
pCO2 arterial: 30.3 mmHg — ABNORMAL LOW (ref 32.0–48.0)
pH, Arterial: 7.44 (ref 7.350–7.450)
pO2, Arterial: 69.7 mmHg — ABNORMAL LOW (ref 83.0–108.0)

## 2015-11-03 LAB — LACTIC ACID, PLASMA: LACTIC ACID, VENOUS: 3.1 mmol/L — AB (ref 0.5–1.9)

## 2015-11-03 LAB — URINE MICROSCOPIC-ADD ON

## 2015-11-03 LAB — HEMOGLOBIN A1C
HEMOGLOBIN A1C: 7.6 % — AB (ref 4.8–5.6)
MEAN PLASMA GLUCOSE: 171 mg/dL

## 2015-11-03 LAB — APTT
aPTT: 42 seconds — ABNORMAL HIGH (ref 24–36)
aPTT: 36 seconds (ref 24–36)

## 2015-11-03 LAB — CK: CK TOTAL: 604 U/L — AB (ref 38–234)

## 2015-11-03 LAB — PREPARE RBC (CROSSMATCH)

## 2015-11-03 LAB — TROPONIN I
TROPONIN I: 0.14 ng/mL — AB (ref <0.03)
TROPONIN I: 1.77 ng/mL — AB (ref <0.03)

## 2015-11-03 MED ORDER — FUROSEMIDE 10 MG/ML IJ SOLN
INTRAMUSCULAR | Status: AC
  Filled 2015-11-03: qty 4

## 2015-11-03 MED ORDER — FENTANYL CITRATE (PF) 100 MCG/2ML IJ SOLN
50.0000 ug | Freq: Once | INTRAMUSCULAR | Status: AC
  Filled 2015-11-03: qty 2

## 2015-11-03 MED ORDER — SODIUM CHLORIDE 0.9 % IV BOLUS (SEPSIS)
500.0000 mL | Freq: Once | INTRAVENOUS | Status: AC
  Administered 2015-11-03: 500 mL via INTRAVENOUS

## 2015-11-03 MED ORDER — NALOXONE HCL 0.4 MG/ML IJ SOLN
INTRAMUSCULAR | Status: AC
  Administered 2015-11-03: 16:00:00
  Filled 2015-11-03: qty 1

## 2015-11-03 MED ORDER — MIDAZOLAM HCL 2 MG/2ML IJ SOLN: 1.0000 mg | INTRAMUSCULAR | Status: DC | PRN

## 2015-11-03 MED ORDER — NALOXONE HCL 0.4 MG/ML IJ SOLN
0.4000 mg | Freq: Once | INTRAMUSCULAR | Status: AC
  Administered 2015-11-03: 0.4 mg via INTRAVENOUS

## 2015-11-03 MED ORDER — ETOMIDATE 2 MG/ML IV SOLN
20.0000 mg | Freq: Once | INTRAVENOUS | Status: AC
  Administered 2015-11-03: 20 mg via INTRAVENOUS

## 2015-11-03 MED ORDER — FENTANYL CITRATE (PF) 100 MCG/2ML IJ SOLN
INTRAMUSCULAR | Status: AC
  Filled 2015-11-03: qty 4

## 2015-11-03 MED ORDER — MIDAZOLAM HCL 2 MG/2ML IJ SOLN
INTRAMUSCULAR | Status: AC
  Filled 2015-11-03: qty 4

## 2015-11-03 MED ORDER — CHLORHEXIDINE GLUCONATE 0.12% ORAL RINSE (MEDLINE KIT)
15.0000 mL | Freq: Two times a day (BID) | OROMUCOSAL | Status: DC
  Administered 2015-11-04 (×2): 15 mL via OROMUCOSAL

## 2015-11-03 MED ORDER — FUROSEMIDE 10 MG/ML IJ SOLN
40.0000 mg | Freq: Once | INTRAMUSCULAR | Status: AC
  Administered 2015-11-03: 40 mg via INTRAVENOUS

## 2015-11-03 MED ORDER — SODIUM CHLORIDE 0.9 % IV SOLN
Freq: Once | INTRAVENOUS | Status: AC
  Administered 2015-11-03: 19:00:00 via INTRAVENOUS

## 2015-11-03 MED ORDER — NALOXONE HCL 0.4 MG/ML IJ SOLN
INTRAMUSCULAR | Status: AC
  Filled 2015-11-03: qty 1

## 2015-11-03 MED ORDER — NALOXONE NEWBORN-WH INJECTION 0.4 MG/ML: 0.4000 mg | Freq: Once | INTRAMUSCULAR | Status: DC

## 2015-11-03 MED ORDER — DEXTROSE 5 % IV SOLN
2.0000 g | Freq: Two times a day (BID) | INTRAVENOUS | Status: DC
  Administered 2015-11-03 – 2015-11-12 (×18): 2 g via INTRAVENOUS
  Filled 2015-11-03 (×21): qty 2

## 2015-11-03 MED ORDER — ORAL CARE MOUTH RINSE
15.0000 mL | Freq: Four times a day (QID) | OROMUCOSAL | Status: DC
  Administered 2015-11-03 – 2015-11-04 (×3): 15 mL via OROMUCOSAL

## 2015-11-03 MED ORDER — NOREPINEPHRINE BITARTRATE 1 MG/ML IV SOLN
0.0000 ug/min | INTRAVENOUS | Status: DC
  Administered 2015-11-03: 10 ug/min via INTRAVENOUS
  Filled 2015-11-03: qty 4

## 2015-11-03 MED ORDER — FAMOTIDINE IN NACL 20-0.9 MG/50ML-% IV SOLN
20.0000 mg | Freq: Two times a day (BID) | INTRAVENOUS | Status: DC
  Administered 2015-11-03: 20 mg via INTRAVENOUS
  Filled 2015-11-03 (×4): qty 50

## 2015-11-03 MED ORDER — FENTANYL CITRATE (PF) 100 MCG/2ML IJ SOLN
100.0000 ug | Freq: Once | INTRAMUSCULAR | Status: AC
Start: 2015-11-03 — End: 2015-11-03
  Administered 2015-11-03: 100 ug via INTRAVENOUS

## 2015-11-03 MED ORDER — DEXTROSE 5 % IV SOLN
1.0000 g | Freq: Three times a day (TID) | INTRAVENOUS | Status: DC
  Filled 2015-11-03 (×2): qty 1

## 2015-11-03 MED ORDER — MAGNESIUM SULFATE 2 GM/50ML IV SOLN
2.0000 g | Freq: Once | INTRAVENOUS | Status: AC
  Administered 2015-11-03: 2 g via INTRAVENOUS
  Filled 2015-11-03: qty 50

## 2015-11-03 MED ORDER — FENTANYL 2500MCG IN NS 250ML (10MCG/ML) PREMIX INFUSION: 25.0000 ug/h | INTRAVENOUS | Status: DC

## 2015-11-03 MED ORDER — MIDAZOLAM HCL 2 MG/2ML IJ SOLN
2.0000 mg | Freq: Once | INTRAMUSCULAR | Status: AC
  Administered 2015-11-03: 2 mg via INTRAVENOUS

## 2015-11-03 MED ORDER — VANCOMYCIN HCL IN DEXTROSE 750-5 MG/150ML-% IV SOLN
750.0000 mg | Freq: Two times a day (BID) | INTRAVENOUS | Status: DC
  Administered 2015-11-03 – 2015-11-06 (×6): 750 mg via INTRAVENOUS
  Filled 2015-11-03 (×7): qty 150

## 2015-11-03 MED ORDER — DOPAMINE-DEXTROSE 3.2-5 MG/ML-% IV SOLN
INTRAVENOUS | Status: AC
  Administered 2015-11-03: 5 ug/kg/min
  Filled 2015-11-03: qty 250

## 2015-11-03 MED ORDER — FENTANYL BOLUS VIA INFUSION
25.0000 ug | INTRAVENOUS | Status: DC | PRN
  Filled 2015-11-03: qty 25

## 2015-11-03 MED ORDER — IOPAMIDOL (ISOVUE-370) INJECTION 76%
100.0000 mL | Freq: Once | INTRAVENOUS | Status: AC | PRN
Start: 2015-11-03 — End: 2015-11-03
  Administered 2015-11-03: 100 mL via INTRAVENOUS

## 2015-11-03 MED ORDER — FENTANYL CITRATE (PF) 100 MCG/2ML IJ SOLN
25.0000 ug | INTRAMUSCULAR | Status: DC | PRN
Start: 2015-11-03 — End: 2015-11-04
  Administered 2015-11-03 – 2015-11-04 (×2): 50 ug via INTRAVENOUS
  Administered 2015-11-04 (×2): 100 ug via INTRAVENOUS
  Filled 2015-11-03 (×3): qty 2

## 2015-11-03 NOTE — Clinical Social Work Note (Signed)
CSW acknowledges consult for "access meds for discharge." RNCM notified.  CSW signing off. Consult again if any social work needs arise.  Charlynn Court, CSW [phone]

## 2015-11-03 NOTE — Progress Notes (Addendum)
Pharmacy Antibiotic Note  [name] [name] is a 65 y.o. female admitted on 10/30/2015 with sepsis / wound infection.  Pharmacy has been consulted for vancomycin and aztreonam dosing.  Plan: - vancomycin 750 mg iv q12h and aztreonam 1g iv q8h - follow up on antibiotic before checking vancomycin trough - monitor renal function  Height: 5\' 4"  (162.6 cm) Weight: 158 lb 11.7 oz (72 kg) IBW/kg (Calculated) : 54.7  Temp (24hrs), Avg:98.8 F (37.1 C), Min:97.8 F (36.6 C), Max:99.6 F (37.6 C)   Recent Labs Lab 10/31/15 0615 11/01/15 0647 11/02/15 0330 11/02/15 1536 11/03/15 0416 11/03/15 1612  WBC 10.1 8.9 9.9 19.8*  --  17.4*  CREATININE 0.57 0.71 0.77  --  0.62 0.64    Estimated Creatinine Clearance: 68.2 mL/min (by C-G formula based on SCr of 0.64 mg/dL).    Allergies  Allergen Reactions  . Hydrochlorothiazide Other (See Comments)    Unknown  . Latex Rash  . Penicillins Swelling and Rash    Pt states she has tolerated Keflex in the past without problems. States she may have tolerated Augmentin in the past but it caused GI upset.     Thank you for allowing pharmacy to be a part of this patient's care.  Kinston Magnan, Tsz-Yin 11/03/2015 5:37 PM    Addendum: switching to cefepime from aztreonam (tolerated ceftriaxone this admission).  2g iv q12h.

## 2015-11-03 NOTE — Progress Notes (Signed)
CRITICAL VALUE ALERT  Critical value received:  Hemoglobin 5.8  Date of notification:  11/03/15  Time of notification:  1700  Critical value read back:Yes.    Nurse who received alert:  Tory Emerald, RN  MD notified (1st page):  Dr Delton Coombes  Time of first page:  On floor 1700  MD notified (2nd page):  Time of second page:  Responding MD:  Dr Jaci Carrel  Time MD responded:  On floor 1700   Order for emergent release blood stat. Orders placed, blood bank called.

## 2015-11-03 NOTE — Consult Note (Addendum)
Requesting Physician: Sharion Dove    Chief Complaint: stroke   HPI:                                                                                                                                         [name] [name] is an 65 y.o. female  admitted from 8/31-09/29/15 for PAD and PVD with claudication; she had stenting of the distal left common femoral artery during that hospitalization. She had previously had a staged left common femoral endarterectomy with profundoplasty on 09/05/15. Patient underwent a left femoral to below-knee popliteal artery bypass with a non-reversed translocation left great saphenous vein along with an intraoperative angiogram on 11/02/2015. Postoperatively she was noted not wake up or respond in addition she was noted not to be moving her left side very well. Due to this neurology was consult at this morning for possibility of intraoperative stroke.    Addendum: Code stroke was called just after consult. Nurse states she was normal at 12:05. Per patients daughter she has not been normal since her surgery. Patient was brought to CT for emergent CTA and Perfusion.   Date last known well: Unable to determine Time last known well: Unable to determine tPA Given: No: Out of window, Recent surgery   Past Medical History:  Diagnosis Date  . Arthritis    "feel like I have it all over" (08/28/2015)  . Complication of anesthesia    DIFFICULT WAKING "only when I was smoking; no problems since I quit"  . Coronary artery disease   . Family history of adverse reaction to anesthesia    sister slow to wake up  . GERD (gastroesophageal reflux disease)    takes Protonix daily   . Hip bursitis   . Hyperlipidemia LDL goal < 70 06/28/2013   takes Atorvastatin daily  . Hypertension    takes Metoprolol and Imdur daily  . Migraine    "none in a long time" (08/28/2015)  . Myocardial infarction 2011  . Neuromuscular disorder (HCC)    DIABETIC NEUROPATHY  . PAD (peripheral artery disease) (HCC)    . Peripheral vascular disease (HCC)   . Type II diabetes mellitus (HCC)    takes Lantus nightly.Average fasting blood sugar runs 80-90    Past Surgical History:  Procedure Laterality Date  . ABDOMINAL HYSTERECTOMY    . APPENDECTOMY    . ATHERECTOMY N/A 06/04/2011   Procedure: ATHERECTOMY;  Surgeon: Runell Gess, MD;  Location: [facility] CATH LAB;  Service: Cardiovascular;  Laterality: N/A;  . CARDIAC CATHETERIZATION  10/13/2009   95% stenosis in the AV groove circumflex and 95% ostial stenosis in small OM3. A 3x25mm drug-eluting Promus stent inserted ito the circumflex. Dilatated with a 3.25x76mm noncompliant Quantum balloon within entire segment. The entire region was reduced to 0% and brisk TIMI3 flow.  . CAROTID DUPLEX  03/19/2011   Right ICA-demonstrates complete occlusion. Left ICA-demonstrates a  small amount of fibrous plaque.  Marland Kitchen CATARACT EXTRACTION W/ INTRAOCULAR LENS IMPLANT Right   . CESAREAN SECTION  1990  . CORONARY ANGIOPLASTY    . ENDARTERECTOMY FEMORAL Left 09/05/2015   Procedure: ENDARTERECTOMY FEMORAL WITH PROFUNDOPLASTY;  Surgeon: Chuck Hint, MD;  Location: [facility] OR;  Service: Vascular;  Laterality: Left;  Left common femoral artery vein patch using left saphenous vien  . FEMORAL-POPLITEAL BYPASS GRAFT Left 11/02/2015   Procedure: BYPASS GRAFT FEMORAL-POPLITEAL ARTERY VS FEMORAL-TIBIAL ARTERY BYPASS;  Surgeon: Chuck Hint, MD;  Location: [facility] OR;  Service: Vascular;  Laterality: Left;  . ILIAC ARTERY STENT Left 08/28/2015   common  . INTRAOPERATIVE ARTERIOGRAM Left 09/05/2015   Procedure: INTRA OPERATIVE ARTERIOGRAM;  Surgeon: Chuck Hint, MD;  Location: [facility] OR;  Service: Vascular;  Laterality: Left;  . INTRAOPERATIVE ARTERIOGRAM Left 11/02/2015   Procedure: INTRA OPERATIVE ARTERIOGRAM;  Surgeon: Chuck Hint, MD;  Location: [facility] OR;  Service: Vascular;  Laterality: Left;  . LEXISCAN MYOVIEW  10/25/2010   Moderate perfusion defect due to infarct/scar  with mild perinfarct ischemia seen in the Basal Inferolateral, Basal Anterolateral, Mid Inferolateral, and Mid Anterolateral regions. Post-stress EF is 50%.  . OVARY SURGERY  1983?   "ruptured"  . PERIPHERAL VASCULAR ANGIOGRAM  01/26/2010   High-grade SFA disease: left greater than right. Left SFA would require fem-pop bypass grafting. Right SFA could be stented but might require Diamondback Orbital atherectomy.  Marland Kitchen PERIPHERAL VASCULAR ANGIOGRAM  02/23/2010   Stealth Predator orbital rotational atherectomy performed on SFA & Popliteal up to 90,000 RPM. Stenting using overlapping 5x194mm and 5x71mm Absolute Pro Nitinol self-expanding stents beginning just at the knee up to the mid SFA resulting in reduction of 90-95% calcified SFA & Popliteal stenosis to 0. Stenting performed on the distal common & proximal iliac artery with a 10x4 Absolute Pro- 70-0%.  Marland Kitchen PERIPHERAL VASCULAR ANGIOGRAM  06/17/2010   PTA performed to the right external iliac artery stent using a 5x100 balloon at 10 atmospheres. Stenting performed using a 6x18 Genesis on Opta balloon. Postdilatation with a 7x2 balloon resulting in a 95% "in-stent" stenosis to 0% residual.  . PERIPHERAL VASCULAR ANGIOGRAM  06/04/2011   Bilateral total SFAs not percutaneously addressable. Good canidate for femoropopliteal bypass grafting  . PERIPHERAL VASCULAR ANGIOGRAM  08/28/2015  . PERIPHERAL VASCULAR CATHETERIZATION N/A 08/28/2015   Procedure: Lower Extremity Angiography;  Surgeon: Runell Gess, MD;  Location: [facility] INVASIVE CV LAB;  Service: Cardiovascular;  Laterality: N/A;  . PERIPHERAL VASCULAR CATHETERIZATION N/A 08/28/2015   Procedure: Abdominal Aortogram;  Surgeon: Runell Gess, MD;  Location: [facility] INVASIVE CV LAB;  Service: Cardiovascular;  Laterality: N/A;  . PERIPHERAL VASCULAR CATHETERIZATION Left 08/28/2015   Procedure: Peripheral Vascular Intervention;  Surgeon: Runell Gess, MD;  Location: [facility] INVASIVE CV LAB;  Service: Cardiovascular;   Laterality: Left;  common iliac  . PERIPHERAL VASCULAR CATHETERIZATION Left 08/28/2015   Procedure: Peripheral Vascular Atherectomy;  Surgeon: Runell Gess, MD;  Location: [facility] INVASIVE CV LAB;  Service: Cardiovascular;  Laterality: Left;  common iliac  . PERIPHERAL VASCULAR CATHETERIZATION N/A 09/28/2015   Procedure: Lower Extremity Angiography;  Surgeon: Runell Gess, MD;  Location: [facility] INVASIVE CV LAB;  Service: Cardiovascular;  Laterality: N/A;  . PERIPHERAL VASCULAR CATHETERIZATION Left 09/28/2015   Procedure: Peripheral Vascular Intervention;  Surgeon: Runell Gess, MD;  Location: [facility] INVASIVE CV LAB;  Service: Cardiovascular;  Laterality: Left CFA  PCI with 9 mm x 4 cm Abbott nitinol absolute Pro self-expanding  stent     . TRANSTHORACIC ECHOCARDIOGRAM  10/17/2009   EF 45-50%, moderate hypokinesis of the entire inferolateral myocardium, mild concentric hypertrophy and mild regurg of the mitral valva.  Marland Kitchen VEIN HARVEST Left 11/02/2015   Procedure: LEFT GREATER SAPHENOUS VEIN HARVEST;  Surgeon: Chuck Hint, MD;  Location: [facility] OR;  Service: Vascular;  Laterality: Left;    Family History  Problem Relation Age of Onset  . Hypertension Mother   . Heart failure Mother   . Heart failure Father   . Stroke Father   . Diabetes Father    Social History:  reports that she quit smoking about 6 years ago. Her smoking use included Cigarettes. She has a 61.50 pack-year smoking history. She has never used smokeless tobacco. She reports that she does not drink alcohol or use drugs.  Allergies:  Allergies  Allergen Reactions  . Hydrochlorothiazide Other (See Comments)    Unknown  . Latex Rash  . Penicillins Swelling and Rash    Pt states she has tolerated Keflex in the past without problems. States she may have tolerated Augmentin in the past but it caused GI upset.    Medications:                                                                                                                            Prior to Admission:  Prescriptions Prior to Admission  Medication Sig Dispense Refill Last Dose  . amLODipine (NORVASC) 5 MG tablet TAKE 1 TABLET(5 MG) BY MOUTH DAILY 30 tablet 11 10/29/2015 at Unknown time  . aspirin EC 81 MG tablet Take 81 mg by mouth daily.   10/29/2015 at Unknown time  . atorvastatin (LIPITOR) 80 MG tablet Take 1 tablet (80 mg total) by mouth daily. 90 tablet 1 10/29/2015 at Unknown time  . clopidogrel (PLAVIX) 75 MG tablet Take 1 tablet (75 mg total) by mouth daily. Holding for vascular surgery, restart as soon as possible after leg surgery (after 8/8) 90 tablet 3 10/30/2015 at 1100  . gabapentin (NEURONTIN) 300 MG capsule Take 300 mg by mouth 3 (three) times daily.    10/30/2015 at Unknown time  . isosorbide mononitrate (IMDUR) 60 MG 24 hr tablet Take 90 mg by mouth daily.    10/29/2015 at Unknown time  . ketoconazole (NIZORAL) 2 % cream Apply 1 application topically daily.    10/30/2015 at Unknown time  . losartan (COZAAR) 100 MG tablet TAKE 1 TABLET BY MOUTH EVERY DAY 30 tablet 11 10/29/2015 at Unknown time  . metFORMIN (GLUCOPHAGE) 1000 MG tablet Take 1,000 mg by mouth 2 (two) times daily with a meal.   10/30/2015 at Unknown time  . metoprolol succinate (TOPROL-XL) 50 MG 24 hr tablet Take 1 tablet (50 mg total) by mouth daily. 30 tablet 2 10/28/2015 at unknown time  . oxyCODONE-acetaminophen (PERCOCET/ROXICET) 5-325 MG tablet Take 1-2 tablets by mouth every 6 (six) hours as needed for moderate pain. 20 tablet 0 10/30/2015  at Unknown time  . pantoprazole (PROTONIX) 40 MG tablet Take 1 tablet (40 mg total) by mouth daily. 30 tablet 10 10/30/2015 at Unknown time  . SANTYL ointment APP TOPICALLY UTD  0 10/30/2015 at Unknown time  . Insulin Glargine (LANTUS) 100 UNIT/ML Solostar Pen Inject 35 Units into the skin daily at 10 pm.    10/29/2015 at unknown time  . OVER THE COUNTER MEDICATION Apply 1 application topically daily. [place]   unknown   Scheduled: . amLODipine  5 mg  Oral Daily  . aspirin EC  81 mg Oral Daily  . atorvastatin  80 mg Oral Daily  . clopidogrel  75 mg Oral Daily  . feeding supplement (GLUCERNA SHAKE)  237 mL Oral BID BM  . feeding supplement (PRO-STAT SUGAR FREE 64)  30 mL Oral BID  . gabapentin  300 mg Oral TID  . insulin aspart  0-15 Units Subcutaneous Q4H  . insulin glargine  35 Units Subcutaneous Q2200  . isosorbide mononitrate  90 mg Oral Daily  . losartan  100 mg Oral Daily  . metoprolol succinate  50 mg Oral Daily  . naloxone      . pantoprazole  40 mg Oral Daily  . polyethylene glycol  17 g Oral Daily  . senna-docusate  1 tablet Oral QHS    ROS:                                                                                                                                       History obtained from unobtainable from patient due to language barrier    Neurologic Examination:                                                                                                      Blood pressure 123/72, pulse 98, temperature 99.4 F (37.4 C), temperature source Oral, resp. rate (!) 24, height 5\' 4"  (1.626 m), weight 72 kg (158 lb 11.7 oz), SpO2 97 %.  HEENT-  Normocephalic, no lesions, without obvious abnormality.  Normal external eye and conjunctiva.  Normal TM's bilaterally.  Normal auditory canals and external ears. Normal external nose, mucus membranes and septum.  Normal pharynx. Cardiovascular- S1, S2 normal, pulses palpable throughout   Lungs- chest clear, no wheezing, rales, normal symmetric air entry Abdomen- normal findings: bowel sounds normal Extremities- no edema Lymph-no adenopathy palpable Musculoskeletal-no joint tenderness, deformity or swelling Skin-warm and dry, no hyperpigmentation, vitiligo, or suspicious lesions  Neurological Examination Mental Status: Drowsy, able  to localize to sternal rub with right hand. Not able to follow commands.  Cranial Nerves: II: no blink to threat, pupils equal, round,  reactive to light and accommodation III,IV, VI: ptosis not present, extra-ocular motions intact bilaterally V,VII: face has a left air leak and facial droop, facial light touch sensation normal bilaterally VIII: hearing normal bilaterally IX,X: uvula rises symmetrically XI: bilateral shoulder shrug XII: midline tongue extension Motor: Right : Upper extremity   5/5    Left:     Upper extremity   0/5  Lower extremity   5/5     Lower extremity   0/5 Tone and bulk:normal tone throughout; no atrophy noted Sensory: no response to pain in the left arm or leg Deep Tendon Reflexes: 1+ and symmetric throughout Plantars: Right: downgoing   Left: downgoing Cerebellar: normal finger-to-nose,  and normal heel-to-shin test Gait: not tested       Lab Results: Basic Metabolic Panel:  Recent Labs Lab 10/30/15 1930 10/31/15 0615 11/01/15 0647 11/02/15 0330 11/02/15 2202 11/03/15 0416  NA 137 139 138 138  --  139  K 3.7 4.1 4.3 3.7  --  4.2  CL 103 104 103 104  --  108  CO2 26 28 27 27   --  25  GLUCOSE 172* 149* 120* 133*  --  81  BUN 10 6 13 13   --  12  CREATININE 0.56 0.57 0.71 0.77  --  0.62  CALCIUM 8.6* 8.7* 8.7* 8.5*  --  8.1*  MG  --   --   --   --  1.2*  --     Liver Function Tests: No results for input(s): AST, ALT, ALKPHOS, BILITOT, PROT, ALBUMIN in the last 168 hours. No results for input(s): LIPASE, AMYLASE in the last 168 hours. No results for input(s): AMMONIA in the last 168 hours.  CBC:  Recent Labs Lab 10/30/15 1930 10/31/15 0615 11/01/15 0647 11/02/15 0330 11/02/15 1536  WBC 13.3* 10.1 8.9 9.9 19.8*  NEUTROABS 10.0*  --   --   --   --   HGB 10.5* 9.7* 8.9* 8.3* 8.2*  HCT 31.2* 30.1* 27.6* 25.1* 25.7*  MCV 85.0 85.3 84.9 84.2 85.7  PLT 445* 364 342 339 339    Cardiac Enzymes: No results for input(s): CKTOTAL, CKMB, CKMBINDEX, TROPONINI in the last 168 hours.  Lipid Panel: No results for input(s): CHOL, TRIG, HDL, CHOLHDL, VLDL, LDLCALC in the last  454 hours.  CBG:  Recent Labs Lab 11/03/15 0358 11/03/15 0759 11/03/15 0828 11/03/15 1119 11/03/15 1324  GLUCAP 82 62* 90 189* 107*    Microbiology: Results for orders placed or performed during the hospital encounter of 10/30/15  Surgical pcr screen     Status: None   Collection Time: 10/31/15  2:02 AM  Result Value Ref Range Status   MRSA, PCR NEGATIVE NEGATIVE Final   Staphylococcus aureus NEGATIVE NEGATIVE Final    Comment:        The Xpert SA Assay (FDA approved for NASAL specimens in patients over 75 years of age), is one component of a comprehensive surveillance program.  Test performance has been validated by [facility] for patients greater than or equal to 33 year old. It is not intended to diagnose infection nor to guide or monitor treatment.     Coagulation Studies:  Recent Labs  11/02/15 0330  LABPROT 15.4*  INR 1.22    Imaging: Mr Foot Left W Wo Contrast  Result Date: 11/02/2015 CLINICAL DATA:  Diabetic foot ulcer, assessment for osteomyelitis. EXAM: MRI OF THE LEFT FOREFOOT WITHOUT AND WITH CONTRAST TECHNIQUE: Multiplanar, multisequence MR imaging was performed both before and after administration of intravenous contrast. CONTRAST:  15mL MULTIHANCE GADOBENATE DIMEGLUMINE 529 MG/ML IV SOLN COMPARISON:  10/30/2015 FINDINGS: Osteomyelitis protocol MRI of the foot was obtained, to include the entire foot and ankle. This protocol uses a large field of view to cover the entire foot and ankle, and is suitable for assessing bony structures for osteomyelitis. Due to the large field of view and imaging plane choice, this protocol is less sensitive for assessing small structures such as ligamentous structures of the foot and ankle, compared to a dedicated forefoot or dedicated hindfoot exam. Despite efforts by the technologist and patient, motion artifact is present on today's exam and could not be eliminated. This reduces exam sensitivity and specificity. Abnormal  edema and enhancement involving the distal head and metaphysis of the fifth metatarsal and adjacent proximal half of the small toe proximal phalanx compatible with osteomyelitis. Lateral ulceration along the fifth MTP joint probably communicates with the joint on image 33/6. Extensive subcutaneous edema along the plantar foot at the level of the midfoot and forefoot, although I do not see a drainable abscess this may well be spontaneously draining to the plantar foot given the overlying skin irregularity. There is edema and likely some enhancement along the plantar foot musculature. Thickened proximal plantar fascia. Dorsal subcutaneous edema in the foot. IMPRESSION: 1. Ulceration lateral to the fifth MTP joint appears to extend all the way down into the joint. There is osteomyelitis distally in the fifth metatarsal and also in the proximal phalanx of the fifth toe. No other osteomyelitis in the foot is identified. 2. Suspected ulcerations along the plantar foot with confluent phlegmon tracking in the plantar foot, primarily within the subcutaneous tissues but also along the deep margin of the medial band of the plantar fascia. I suspected any early abscess in this region is spontaneously draining. 3. Cellulitis along the dorsum of the foot. Electronically Signed   By: Gaylyn RongWalter  Liebkemann M.D.   On: 11/02/2015 07:31   Dg Ang/ext/uni/or Left  Result Date: 11/02/2015 CLINICAL DATA:  65 year old female undergoing distal bypass EXAM: LEFT ANG/EXT/UNI/ OR COMPARISON:  Prior intraoperative angiographic images 8 09/17/2015 FINDINGS: Single cross-table lateral intraoperative image demonstrates what appears to be a patent saphenous vein bypass graft attaching to the distal popliteal artery below the knee joint. The popliteal artery is diffusely diseased. Primary runoff to the ankle is via the peroneal artery. The anterior tibial artery is occluded proximally but reconstitutes distally. The posterior tibial artery is  occluded. IMPRESSION: Intraoperative arteriogram as above. Electronically Signed   By: Malachy MoanHeath  McCullough M.D.   On: 11/02/2015 14:26       Assessment and plan discussed with with attending physician and they are in agreement.    Felicie MornDavid Smith PA-C [facility] [phone]  11/03/2015, 2:15 PM   Assessment: 65 y.o. female with left arm and leg weakness likely stroke S/P surgery. LSN is not fully clear but nurse states she was normal at 12:30.  CTA head did not show any large vessel occlusion CTA neck showed the completely occluded right ICA which is probably chronic. Subsequently patient was transferred to ICU due to severe hypertension. Patient mental status worsened clinically patient has generalized hypoxic encephalopathy with possible right hemispheric stroke Now with pressure support. Continue symptomatic treatment. Once stable obtain brain MRI for further management, continue aspirin.

## 2015-11-03 NOTE — Code Documentation (Addendum)
Stroke Team and RRT responded to inpatient Code Stroke at 1423 while patient in 3S12. Patient s/p revascularization to her LLE.   Bedside RN reports the patient was LSN at 1230 and presented with left hemiparesis .The provider was notified and subsequently a Code Stroke was called. Upon Stroke Team arrival the patient was not following commands and was hypotensive with a SBP in the 70's. The heparin gtt was stopped d/t to possible hemorhage and a 500 ml bolus was given. Upon arrival in CT the patient was able to follow commands however her NIH and neuro exam were inconsistent d/t patient participation. See documentation in chart on NIHSS. The patient's BP did increase for a short period and then decreased. Neurologist at bedside and notified of BP. A second 500 ml bolus was given. A delay in getting the CTP occurred d/t unable to achieve venous access. Multiple attempts were made and the IV team was consulted and achieved proper access. After the CT scans were completed the patient's mental status declined again and the patient was unarousable to voice and left sided hemiparesis persists. On the right side the patient was localizing to pain. The patient was then transferred back to 3S12. The patient continued to be unarousable and hypotensive. [facility] at bedside. A third 500 ml bolus was given and dopamine was started. CCM was consulted and the patient will be transferring to ICU.

## 2015-11-03 NOTE — Progress Notes (Signed)
VASCULAR LAB PRELIMINARY  ARTERIAL  ABI completed: Left posterior tibial and dorsalis pedis pressures were not obtained due to patient pain tolerance, recent surgery, and surgical dressings. Right ABI of 0.28 is suggestive of critical arterial occlusive disease at rest. Left TBI of 0.20 is suggestive of abnormal arterial flow into the great toe at rest.    RIGHT    LEFT    PRESSURE WAVEFORM  PRESSURE WAVEFORM  BRACHIAL 137 Biphasic BRACHIAL 138 Biphasic  DP 30 Monophasic DP  Monophasic  PT 39 Monophasic PT  Monophasic  GREAT TOE 14 NA GREAT TOE 28 NA    RIGHT LEFT  ABI 0.28 TBI 0.2     [name] [name], RVT 11/03/2015, 12:38 PM

## 2015-11-03 NOTE — Progress Notes (Signed)
PT Cancellation Note  Patient Details Name: [name] [name] MRN: [id number] DOB: August 24, 1950   Cancelled Treatment:    Reason Eval/Treat Not Completed: Medical issues which prohibited therapy; noted pt with work up for stroke.  Will attempt another day.   Elray Mcgregor 11/03/2015, 3:37 PM  Sheran Lawless, [place] [phone] 11/03/2015

## 2015-11-03 NOTE — Progress Notes (Signed)
PROGRESS NOTE  [name] [name]  ZOX:[id number] DOB: 11-17-1950 DOA: 10/30/2015 PCP: Georgann Housekeeper, MD Outpatient Specialists:  Subjective: Denies any fever or chills. No much pain on left foot  Brief Narrative:  [name] [name] is a 65 y.o. female with medical history significant of DM, PVD with recent revascularization, MI in 2011, HTN, HLD, and peripheral neuropathy presenting with a diabetic foot infection.  [name] [name] is diabetic and developed a wound on the side of her foot.  Went to the doctor, [facility]).  2 weeks ago, developed a blister on the side of her foot and it burst.  Didn't have transportation to the doctor and so missed several appointments.  When she made it there today, they were concerned about these ulcers and sent her to the ER for further evaluation.  Diabetes for about 14 years.  On insulin. Sugars run a little high, 200 range.  Prior A1c was 13, but down to 7 at last check recently.  Used to hceck glucose twice daily but not recently.  She was admitted from 8/31-09/29/15 for PAD and PVD with claudication; she had stenting of the distal left common femoral artery during that hospitalization.  She had previously had a staged left common femoral endarterectomy with profundoplasty on 09/05/15  Subjective:  [name] [name] seen and examined in the PACU, [name] [name] very sleepy due to sedation. S/p Left femoral to below-knee popliteal artery bypass with non-reversed translocated left great saphenous vein. [name] [name] comfortable, pain well controlled.    Assessment & Plan:  Osteomyelitis of left foot including forefoot and possibly toes 3-5, PVD, S/p Left femoral to below-knee popliteal artery bypass with non-reversed translocated left great saphenous vein. -[name] [name] with reported h/o foot infection with known PVD but multiple missed appointments to wound care center since revascularization procedures in Aug 2017. [name] [name] is diabetic, but it's unclear if this  is secondary to diabetic insensate neuropathy versus vascular disease. Last ABIs showed ischemic range (ABI 0.3) on both feet.  - Discussed case with ortho, they will defer to vascular surgery if amputation needed.  - Cont heparin  - Monitor pedal pulses  - Abx management per ID - Abx stopped to follow cultures from procedure  - MRI reviewed  Diabetes with peripheral neuropathy -Uncontrolled, hemoglobin A1c 8.3 in August 2017. -Continue Lantus but hold Metformin -Cover with SSI  HLD -Continue Lipitor 80mg   HTN -Continue Norvasc, Cozaar, Toprol.  Constipation  - Miralax  - Senakote   DVT prophylaxis: Heparin  Code Status: Full Code Family Communication:  None at bedside Disposition Plan: Discharge when medically improving   Consultants:   Edilia Bo  Procedures:   None  Antimicrobials:   Vancomycin and aztreonam  Objective: Vitals:   11/02/15 2000 11/03/15 0029 11/03/15 0356 11/03/15 0700  BP: (!) 147/55 (!) 140/50 120/60 116/69  Pulse: 81 86 82 92  Resp: (!) 24 (!) 27 (!) 22 13  Temp:  99.6 F (37.6 C) 98.7 F (37.1 C) 97.8 F (36.6 C)  TempSrc:  Oral Oral Oral  SpO2: 93% 99% 97% 98%  Weight:      Height:        Intake/Output Summary (Last 24 hours) at 11/03/15 0914 Last data filed at 11/03/15 0800  Gross per 24 hour  Intake          4098.25 ml  Output             1965 ml  Net  2133.25 ml   Filed Weights   10/30/15 1644 11/02/15 1748  Weight: 72.6 kg (160 lb) 72 kg (158 lb 11.7 oz)    Examination: General exam: Appears calm and comfortable  Respiratory system: Clear to auscultation. Respiratory effort normal. Cardiovascular system: S1 & S2 heard, RRR. No JVD, murmurs, rubs, gallops or clicks. No pedal edema. Gastrointestinal system: Abdomen is nondistended, soft and nontender. No organomegaly or masses felt.  Central nervous system: Alert and oriented. No focal neurological deficits. Extremities: Symmetric, left knee difficult ROM due  to pain Skin: JP drainage in place x 3, incisions clean, Left foot with gauzes in place clean and dry Psychiatry: Judgement and insight appear normal. Mood & affect appropriate.   Data Reviewed: I have personally reviewed following labs and imaging studies  CBC:  Recent Labs Lab 10/30/15 1930 10/31/15 0615 11/01/15 0647 11/02/15 0330 11/02/15 1536  WBC 13.3* 10.1 8.9 9.9 19.8*  NEUTROABS 10.0*  --   --   --   --   HGB 10.5* 9.7* 8.9* 8.3* 8.2*  HCT 31.2* 30.1* 27.6* 25.1* 25.7*  MCV 85.0 85.3 84.9 84.2 85.7  PLT 445* 364 342 339 339   Basic Metabolic Panel:  Recent Labs Lab 10/30/15 1930 10/31/15 0615 11/01/15 0647 11/02/15 0330 11/02/15 2202 11/03/15 0416  NA 137 139 138 138  --  139  K 3.7 4.1 4.3 3.7  --  4.2  CL 103 104 103 104  --  108  CO2 26 28 27 27   --  25  GLUCOSE 172* 149* 120* 133*  --  81  BUN 10 6 13 13   --  12  CREATININE 0.56 0.57 0.71 0.77  --  0.62  CALCIUM 8.6* 8.7* 8.7* 8.5*  --  8.1*  MG  --   --   --   --  1.2*  --    GFR: Estimated Creatinine Clearance: 68.2 mL/min (by C-G formula based on SCr of 0.62 mg/dL). Liver Function Tests: No results for input(s): AST, ALT, ALKPHOS, BILITOT, PROT, ALBUMIN in the last 168 hours. No results for input(s): LIPASE, AMYLASE in the last 168 hours. No results for input(s): AMMONIA in the last 168 hours. Coagulation Profile:  Recent Labs Lab 11/02/15 0330  INR 1.22   Cardiac Enzymes: No results for input(s): CKTOTAL, CKMB, CKMBINDEX, TROPONINI in the last 168 hours. BNP (last 3 results) No results for input(s): PROBNP in the last 8760 hours. HbA1C:  Recent Labs  11/02/15 0330  HGBA1C 7.6*   CBG:  Recent Labs Lab 11/02/15 2021 11/03/15 0030 11/03/15 0358 11/03/15 0759 11/03/15 0828  GLUCAP 100* 117* 82 62* 90   Lipid Profile: No results for input(s): CHOL, HDL, LDLCALC, TRIG, CHOLHDL, LDLDIRECT in the last 72 hours. Thyroid Function Tests: No results for input(s): TSH, T4TOTAL,  FREET4, T3FREE, THYROIDAB in the last 72 hours. Anemia Panel: No results for input(s): VITAMINB12, FOLATE, FERRITIN, TIBC, IRON, RETICCTPCT in the last 72 hours. Urine analysis:    Component Value Date/Time   COLORURINE YELLOW 09/05/2015 0641   APPEARANCEUR CLEAR 09/05/2015 0641   LABSPEC 1.013 09/05/2015 0641   PHURINE 6.0 09/05/2015 0641   GLUCOSEU 100 (A) 09/05/2015 0641   HGBUR TRACE (A) 09/05/2015 0641   BILIRUBINUR NEGATIVE 09/05/2015 0641   KETONESUR NEGATIVE 09/05/2015 0641   PROTEINUR 100 (A) 09/05/2015 0641   UROBILINOGEN 1.0 10/16/2009 0243   NITRITE NEGATIVE 09/05/2015 0641   LEUKOCYTESUR NEGATIVE 09/05/2015 0641   Sepsis Labs: @LABRCNTIP (procalcitonin:4,lacticidven:4)  ) Recent Results (from the  past 240 hour(s))  Surgical pcr screen     Status: None   Collection Time: 10/31/15  2:02 AM  Result Value Ref Range Status   MRSA, PCR NEGATIVE NEGATIVE Final   Staphylococcus aureus NEGATIVE NEGATIVE Final    Comment:        The Xpert SA Assay (FDA approved for NASAL specimens in patients over 40 years of age), is one component of a comprehensive surveillance program.  Test performance has been validated by [facility] for patients greater than or equal to 6 year old. It is not intended to diagnose infection nor to guide or monitor treatment.      Invalid input(s): PROCALCITONIN, LACTICACIDVEN   Radiology Studies: Mr Foot Left W Wo Contrast  Result Date: 11/02/2015 CLINICAL DATA:  Diabetic foot ulcer, assessment for osteomyelitis. EXAM: MRI OF THE LEFT FOREFOOT WITHOUT AND WITH CONTRAST TECHNIQUE: Multiplanar, multisequence MR imaging was performed both before and after administration of intravenous contrast. CONTRAST:  15mL MULTIHANCE GADOBENATE DIMEGLUMINE 529 MG/ML IV SOLN COMPARISON:  10/30/2015 FINDINGS: Osteomyelitis protocol MRI of the foot was obtained, to include the entire foot and ankle. This protocol uses a large field of view to cover the entire  foot and ankle, and is suitable for assessing bony structures for osteomyelitis. Due to the large field of view and imaging plane choice, this protocol is less sensitive for assessing small structures such as ligamentous structures of the foot and ankle, compared to a dedicated forefoot or dedicated hindfoot exam. Despite efforts by the technologist and [name] [name], motion artifact is present on today's exam and could not be eliminated. This reduces exam sensitivity and specificity. Abnormal edema and enhancement involving the distal head and metaphysis of the fifth metatarsal and adjacent proximal half of the small toe proximal phalanx compatible with osteomyelitis. Lateral ulceration along the fifth MTP joint probably communicates with the joint on image 33/6. Extensive subcutaneous edema along the plantar foot at the level of the midfoot and forefoot, although I do not see a drainable abscess this may well be spontaneously draining to the plantar foot given the overlying skin irregularity. There is edema and likely some enhancement along the plantar foot musculature. Thickened proximal plantar fascia. Dorsal subcutaneous edema in the foot. IMPRESSION: 1. Ulceration lateral to the fifth MTP joint appears to extend all the way down into the joint. There is osteomyelitis distally in the fifth metatarsal and also in the proximal phalanx of the fifth toe. No other osteomyelitis in the foot is identified. 2. Suspected ulcerations along the plantar foot with confluent phlegmon tracking in the plantar foot, primarily within the subcutaneous tissues but also along the deep margin of the medial band of the plantar fascia. I suspected any early abscess in this region is spontaneously draining. 3. Cellulitis along the dorsum of the foot. Electronically Signed   By: Gaylyn Rong M.D.   On: 11/02/2015 07:31   Dg Ang/ext/uni/or Left  Result Date: 11/02/2015 CLINICAL DATA:  65 year old female undergoing distal bypass EXAM:  LEFT ANG/EXT/UNI/ OR COMPARISON:  Prior intraoperative angiographic images 8 09/17/2015 FINDINGS: Single cross-table lateral intraoperative image demonstrates what appears to be a patent saphenous vein bypass graft attaching to the distal popliteal artery below the knee joint. The popliteal artery is diffusely diseased. Primary runoff to the ankle is via the peroneal artery. The anterior tibial artery is occluded proximally but reconstitutes distally. The posterior tibial artery is occluded. IMPRESSION: Intraoperative arteriogram as above. Electronically Signed   By: Isac Caddy.D.  On: 11/02/2015 14:26    Scheduled Meds: . amLODipine  5 mg Oral Daily  . aspirin EC  81 mg Oral Daily  . atorvastatin  80 mg Oral Daily  . clopidogrel  75 mg Oral Daily  . feeding supplement (GLUCERNA SHAKE)  237 mL Oral BID BM  . feeding supplement (PRO-STAT SUGAR FREE 64)  30 mL Oral BID  . gabapentin  300 mg Oral TID  . insulin aspart  0-15 Units Subcutaneous Q4H  . insulin glargine  35 Units Subcutaneous Q2200  . isosorbide mononitrate  90 mg Oral Daily  . losartan  100 mg Oral Daily  . metoprolol succinate  50 mg Oral Daily  . pantoprazole  40 mg Oral Daily  . polyethylene glycol  17 g Oral Daily  . senna-docusate  1 tablet Oral QHS   Continuous Infusions: . sodium chloride 75 mL/hr at 11/03/15 0800  . heparin 400 Units/hr (11/02/15 1530)     LOS: 4 days    Latrelle DodrillEdwin Silva, MD [facility] Pager [phone]  If 7PM-7AM, please contact night-coverage [email or web address] Password [email or web address] 11/03/2015, 9:14 AM

## 2015-11-03 NOTE — Progress Notes (Addendum)
[facility]  VASCULAR SURGERY ASSESSMENT & PLAN:   NEURO: Difficult to arouse and very weak in Left UE.  Pt has a known occlusion of the Right ICA and a < 39% left carotid stenosis. (based on carotid duplex on 10/26/15). I am concerned that she may have had a stroke. Will consult neuro.  VASCULAR: Bypass is patent with know severe tibial disease. Peroneal and diseased ATA runoff only. Appreciate Dr. Audrie Lia help. He will follow foot wounds.   JP's with minimal drainage --> D/C drains.   [name] Ferrari, MD, FACS Beeper [phone] Office: [phone]  Subjective  - Tired, and has pain at incisions.  Objective 120/60 82 98.7 F (37.1 C) (Oral) (!) 22 97%  Intake/Output Summary (Last 24 hours) at 11/03/15 0717 Last data filed at 11/03/15 0400  Gross per 24 hour  Intake          3767.25 ml  Output             1810 ml  Net          1957.25 ml    Doppler AT/PT left LE, all incisions soft. JP drains x 3 in place and charged  Proximal 100 cc output Middle 15 cc " Distal 20 cc "   Assessment/Planning: POD # 1  PROCEDURE:  1. Left femoral to below-knee popliteal artery bypass with non-reversed translocated left great saphenous vein. 2. Intraoperative arteriogram  Flow was established to the left leg. Intraoperative arteriogram was obtained which showed spasm and disease below the anastomosis but there were no other options for revascularization given her diffuse calcific disease.  HGB pending re draw due to reading of 5.0.  BP stable 120-140 systolic Stable left LE with patent by pass  [name] [name], [name] [name] [facility] 11/03/2015 7:17 AM

## 2015-11-03 NOTE — Care Management Note (Addendum)
Case Management Note  Patient Details  Name: [name] [name] MRN: [id number] Date of Birth: 07-31-1950  Subjective/Objective:   NCM going to speak with patient and code was working with patient, she transferred to ICU.  Tiffany with [facility] states patient is pre set up with them for [facility]. NCM will need to see if patient will want these services and notify Tiffany with [facility] is she does.  NCM will cont to follow for dc needs.                 Action/Plan:   Expected Discharge Date:  11/02/15               Expected Discharge Plan:  Home w Home Health Services  In-House Referral:     Discharge planning Services  CM Consult  Post Acute Care Choice:    Choice offered to:     DME Arranged:    DME Agency:     HH Arranged:    HH Agency:     Status of Service:  In process, will continue to follow  If discussed at Long Length of Stay Meetings, dates discussed:    Additional Comments:  Leone Haven, RN 11/03/2015, 5:15 PM

## 2015-11-03 NOTE — Progress Notes (Signed)
Patient lethargic.  Minimum response to painful stimuli, Pupils 69mm, sluggish but reactive.  CBG 107.  Paged Dr. Edward Jolly, order received to administer Narcan.   Jaclyn Shaggy Everhart 2:00 PM

## 2015-11-03 NOTE — Progress Notes (Signed)
At 1345, I rec'd a call that Dr. Edilia Bo was on the floor, I went into the room the patient had a change in LOC for 1205.  Patient was unresponsive.  Ortho MD came in and looked at patient's left foot.  Patient began to arouse.  I went into the room I was able to get the patient to wake up and answer questions for me.  She was oriented to person, place and time, she remained drowsy.  She followed commands.  Checked her CBG and it was 107.  At approximately 1405 I went back into the patient's room, the daughter was very tearful.  I asked her what was wrong and she stated that she was unresponsive again.  I attempted to sternal rub the patient and there was no response.  Her pupils were 61mm, sluggish but reactive.  I paged attending and received an order to give Narcan. Administered the Narcan, patient aroused, but her speech was garbled and she was not moving her left side.  I called the charge nurse and called a code stroke.

## 2015-11-03 NOTE — Progress Notes (Addendum)
Subjective: No new complaints when I saw her earlier today   Antibiotics:  Anti-infectives    Start     Dose/Rate Route Frequency Ordered Stop   11/03/15 1900  vancomycin (VANCOCIN) IVPB 750 mg/150 ml premix     750 mg 150 mL/hr over 60 Minutes Intravenous Every 12 hours 11/03/15 1743     11/03/15 1900  aztreonam (AZACTAM) 1 g in dextrose 5 % 50 mL IVPB  Status:  Discontinued     1 g 100 mL/hr over 30 Minutes Intravenous Every 8 hours 11/03/15 1743 11/03/15 1909   11/02/15 0600  vancomycin (VANCOCIN) IVPB 1000 mg/200 mL premix  Status:  Discontinued     1,000 mg 200 mL/hr over 60 Minutes Intravenous On call to O.R. 11/01/15 0854 11/01/15 0910   11/01/15 1300  cefTRIAXone (ROCEPHIN) 1 g in dextrose 5 % 50 mL IVPB     1 g 100 mL/hr over 30 Minutes Intravenous  Once 11/01/15 1215 11/01/15 1407   10/31/15 0600  vancomycin (VANCOCIN) IVPB 750 mg/150 ml premix  Status:  Discontinued     750 mg 150 mL/hr over 60 Minutes Intravenous Every 12 hours 10/31/15 0156 11/02/15 1828   10/31/15 0600  aztreonam (AZACTAM) 1 g in dextrose 5 % 50 mL IVPB  Status:  Discontinued     1 g 100 mL/hr over 30 Minutes Intravenous Every 8 hours 10/31/15 0210 11/01/15 1215   10/31/15 0045  aztreonam (AZACTAM) 2 g in dextrose 5 % 50 mL IVPB     2 g 100 mL/hr over 30 Minutes Intravenous  Once 10/31/15 0033 10/31/15 0131   10/30/15 2030  vancomycin (VANCOCIN) 1,500 mg in sodium chloride 0.9 % 500 mL IVPB     1,500 mg 250 mL/hr over 120 Minutes Intravenous  Once 10/30/15 2020 10/30/15 2335   10/30/15 2030  levofloxacin (LEVAQUIN) IVPB 750 mg     750 mg 100 mL/hr over 90 Minutes Intravenous  Once 10/30/15 2020 10/30/15 2231      Medications: Scheduled Meds: . atorvastatin  80 mg Oral Daily  . famotidine (PEPCID) IV  20 mg Intravenous Q12H  . feeding supplement (GLUCERNA SHAKE)  237 mL Oral BID BM  . feeding supplement (PRO-STAT SUGAR FREE 64)  30 mL Oral BID  . fentaNYL (SUBLIMAZE) injection  50  mcg Intravenous Once  . gabapentin  300 mg Oral TID  . insulin aspart  0-15 Units Subcutaneous Q4H  . insulin glargine  35 Units Subcutaneous Q2200  . naloxone      . pantoprazole  40 mg Oral Daily  . polyethylene glycol  17 g Oral Daily  . senna-docusate  1 tablet Oral QHS  . vancomycin  750 mg Intravenous Q12H   Continuous Infusions: . sodium chloride 75 mL/hr at 11/03/15 1100  . fentaNYL infusion INTRAVENOUS    . norepinephrine (LEVOPHED) Adult infusion 10 mcg/min (11/03/15 1800)   PRN Meds:.sodium chloride, acetaminophen **OR** acetaminophen, alum & mag hydroxide-simeth, bisacodyl, fentaNYL, guaiFENesin-dextromethorphan, magnesium hydroxide, magnesium sulfate 1 - 4 g bolus IVPB, midazolam, midazolam, ondansetron, phenol, potassium chloride    Objective: Weight change:   Intake/Output Summary (Last 24 hours) at 11/03/15 1909 Last data filed at 11/03/15 1745  Gross per 24 hour  Intake          1994.03 ml  Output              645 ml  Net  1349.03 ml   Blood pressure (!) 135/54, pulse 96, temperature 98.6 F (37 C), temperature source Oral, resp. rate (!) 31, height 5\' 4"  (1.626 m), weight 161 lb 9.6 oz (73.3 kg), SpO2 97 %. Temp:  [97.8 F (36.6 C)-99.6 F (37.6 C)] 98.6 F (37 C) (10/06 1745) Pulse Rate:  [81-132] 96 (10/06 1854) Resp:  [13-41] 31 (10/06 1854) BP: (43-160)/(27-75) 135/54 (10/06 1854) SpO2:  [93 %-100 %] 97 % (10/06 1854) FiO2 (%):  [60 %-100 %] 60 % (10/06 1854) Weight:  [161 lb 9.6 oz (73.3 kg)] 161 lb 9.6 oz (73.3 kg) (10/06 1621)  Physical Exam: General: Alert and awake, oriented x person and place HEENT:EOMI CVS tachy rate, normal r,  no murmur rubs or gallops Chest:  no wheezing, rales or rhonchi Abdomen: soft , nondistended, normal bowel sounds, Extremities: pulse faint in LLE, dressing not taken down Skin: no rashes Neuro: I was not aware of LUE weakness earlier today  CBC:  CBC Latest Ref Rng & Units 11/03/2015 11/02/2015  11/02/2015  WBC 4.0 - 10.5 K/uL 17.4(H) 19.8(H) 9.9  Hemoglobin 12.0 - 15.0 g/dL 1.6(XW) 9.6(E) 8.3(L)  Hematocrit 36.0 - 46.0 % 18.0(L) 25.7(L) 25.1(L)  Platelets 150 - 400 K/uL 342 339 339      BMET  Recent Labs  11/03/15 0416 11/03/15 1612  NA 139 137  K 4.2 4.3  CL 108 109  CO2 25 20*  GLUCOSE 81 114*  BUN 12 14  CREATININE 0.62 0.64  CALCIUM 8.1* 7.8*     Liver Panel   Recent Labs  11/03/15 1612  PROT 4.8*  ALBUMIN 1.6*  AST 31  ALT 13*  ALKPHOS 85  BILITOT 0.7  BILIDIR 0.1  IBILI 0.6       Sedimentation Rate  Recent Labs  11/02/15 0330  ESRSEDRATE 135*   C-Reactive Protein  Recent Labs  11/02/15 0330  CRP 3.7*    Micro Results: Recent Results (from the past 720 hour(s))  Surgical pcr screen     Status: None   Collection Time: 10/31/15  2:02 AM  Result Value Ref Range Status   MRSA, PCR NEGATIVE NEGATIVE Final   Staphylococcus aureus NEGATIVE NEGATIVE Final    Comment:        The Xpert SA Assay (FDA approved for NASAL specimens in patients over 41 years of age), is one component of a comprehensive surveillance program.  Test performance has been validated by [facility] for patients greater than or equal to 66 year old. It is not intended to diagnose infection nor to guide or monitor treatment.     Studies/Results: Ct Angio Head W Or Wo Contrast  Result Date: 11/03/2015 CLINICAL DATA:  Left-sided weakness and aphasia. EXAM: CT ANGIOGRAPHY HEAD AND NECK CT CEREBRAL PERFUSION WITH CONTRAST TECHNIQUE: Multidetector CT imaging of the head and neck was performed using the standard protocol during bolus administration of intravenous contrast. Multiplanar CT image reconstructions and MIPs were obtained to evaluate the vascular anatomy. Carotid stenosis measurements (when applicable) are obtained utilizing NASCET criteria, using the distal internal carotid diameter as the denominator. CT cerebral perfusion imaging was also performed  during the dynamic injection of intravenous contrast. Post processing was performed. CONTRAST:  90 mL Isovue 370 COMPARISON:  Head MRI/ MRA 09/20/2013 FINDINGS: CTA NECK FINDINGS Aortic arch: Three vessel aortic arch with extensive atherosclerotic calcified plaque. There is approximately 60% stenosis of the proximal brachiocephalic artery and proximal right subclavian artery. Atherosclerotic plaque in a focal when or intimal  flap in the proximal left subclavian artery contribute to 65% stenosis. Right carotid system: Common carotid artery is patent without stenosis. Internal carotid artery is occluded at its origin without reconstitution in the neck. There is mild narrowing of the ECA origin. Left carotid system: Moderate atherosclerotic plaque at the carotid bifurcation without significant common or internal carotid artery stenosis. Mild proximal ECA stenosis. Vertebral arteries: Vertebral arteries are patent and codominant. There is mild right V2 narrowing at the C2 and C5 levels due to calcified plaque. There is mild left V2 narrowing due to degenerative vertebral spurring at C5-6 and C6-7. Skeleton: Moderate to advanced disc degeneration from C4-5 to C6-7. Other neck: 8 mm calculus in the distal left submandibular duct without significant ductal dilatation or inflammatory change. Small left thyroid nodule. Upper chest: Interlobular septal thickening in the lung apices with partially visualized ground-glass opacity posteriorly in the left upper lobe and superior segment segments of the left greater than right upper lobes. Review of the MIP images confirms the above findings CTA HEAD FINDINGS Anterior circulation: Chronically occluded right ICA with reconstitution of the terminus via a small but patent right A1 segment, unchanged. Right M1 segment is widely patent. There is at most mild narrowing of the proximal right M2 inferior division. Decreased number of distal right MCA branch vessels in the right parietal  region is chronic. Intracranial left ICA is patent with extensive atherosclerosis though only resulting in at most mild stenosis. 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 Left A1 segment is widely patent, as is the anterior communicating artery. A2 segments are patent. Left MCA is patent without evidence of major branch occlusion or significant proximal stenosis. Posterior circulation: Intracranial vertebral arteries are patent to the basilar with prominent atherosclerosis, greater on the right. There is severe proximal right V4 stenosis with additional moderate tandem stenoses. PICA origins appear patent, potentially with a high-grade stenosis on the right. AICA and SCA origins are patent. Basilar artery is patent without stenosis. Posterior communicating arteries are not identified. PCAs are patent with mild distal left P1 narrowing. No aneurysm. Venous sinuses: Patent. Anatomic variants: Small right A1. Review of the MIP images confirms the above findings CT CEREBRAL PERFUSION FINDINGS There is mildly prolonged transit time throughout most of the right MCA territory, with slightly greater delayed transit (6 seconds greater than the contralateral side) in the deep watershed territory/centrum semiovale. Cerebral blood flow is preserved. Cerebral blood volume is also normal to mildly increased. No diminished CBF/CBV suggestive of acute infarction is seen. IMPRESSION: 1. 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 2. Prolonged transit time throughout the right MCA territory due to #1 with compensated cerebral blood flow parameters. No evidence of acute infarct. 3. Chronically decreased number of distal right MCA branch vessels. No evidence of acute major branch occlusion. 4. Severe right V4 vertebral artery stenosis. 5. 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 6. Aortic atherosclerosis. 7. 8 mm left submandibular duct calculus. 8. Interlobular septal thickening and ground-glass opacities in lung apices, possibly reflecting edema. Correlate with chest radiography. These results were called by telephone at the time of interpretation on 11/03/2015 at 4:00 pm to Dr. Desmond Lopeurk, who verbally acknowledged these results. Electronically Signed   By: Sebastian AcheAllen  Grady M.D.   On: 11/03/2015 16:31   Ct Angio Neck W Or Wo Contrast  Result Date: 11/03/2015 CLINICAL DATA:  Left-sided weakness  and aphasia. EXAM: CT ANGIOGRAPHY HEAD AND NECK CT CEREBRAL PERFUSION WITH CONTRAST TECHNIQUE: Multidetector CT imaging of the head and neck was performed using the standard protocol during bolus administration of intravenous contrast. Multiplanar CT image reconstructions and MIPs were obtained to evaluate the vascular anatomy. Carotid stenosis measurements (when applicable) are obtained utilizing NASCET criteria, using the distal internal carotid diameter as the denominator. CT cerebral perfusion imaging was also performed during the dynamic injection of intravenous contrast. Post processing was performed. CONTRAST:  90 mL Isovue 370 COMPARISON:  Head MRI/ MRA 09/20/2013 FINDINGS: CTA NECK FINDINGS Aortic arch: Three vessel aortic arch with extensive atherosclerotic calcified plaque. There is approximately 60% stenosis of the proximal brachiocephalic artery and proximal right subclavian artery. Atherosclerotic plaque in a focal when or intimal flap in the proximal left subclavian artery contribute to 65% stenosis. Right carotid system: Common carotid artery is patent without stenosis. Internal carotid artery is occluded at its origin without reconstitution in the neck. There is mild narrowing of the ECA origin. Left carotid system: Moderate atherosclerotic plaque at the carotid bifurcation without significant common or internal carotid artery stenosis. Mild  proximal ECA stenosis. Vertebral arteries: Vertebral arteries are patent and codominant. There is mild right V2 narrowing at the C2 and C5 levels due to calcified plaque. There is mild left V2 narrowing due to degenerative vertebral spurring at C5-6 and C6-7. Skeleton: Moderate to advanced disc degeneration from C4-5 to C6-7. Other neck: 8 mm calculus in the distal left submandibular duct without significant ductal dilatation or inflammatory change. Small left thyroid nodule. Upper chest: Interlobular septal thickening in the lung apices with partially visualized ground-glass opacity posteriorly in the left upper lobe and superior segment segments of the left greater than right upper lobes. Review of the MIP images confirms the above findings CTA HEAD FINDINGS Anterior circulation: Chronically occluded right ICA with reconstitution of the terminus via a small but patent right A1 segment, unchanged. Right M1 segment is widely patent. There is at most mild narrowing of the proximal right M2 inferior division. Decreased number of distal right MCA branch vessels in the right parietal region is chronic. Intracranial left ICA is patent with extensive atherosclerosis though only resulting in at most mild stenosis. 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 Left A1 segment is widely patent, as is the anterior communicating artery. A2 segments are patent. Left MCA is patent without evidence of major branch occlusion or significant proximal stenosis. Posterior circulation: Intracranial vertebral arteries are patent to the basilar with prominent atherosclerosis, greater on the right. There is severe proximal right V4 stenosis with additional moderate tandem stenoses. PICA origins appear patent, potentially with a high-grade stenosis on the right. AICA and SCA origins are patent. Basilar artery is patent without  stenosis. Posterior communicating arteries are not identified. PCAs are patent with mild distal left P1 narrowing. No aneurysm. Venous sinuses: Patent. Anatomic variants: Small right A1. Review of the MIP images confirms the above findings CT CEREBRAL PERFUSION FINDINGS There is mildly prolonged transit time throughout most of the right MCA territory, with slightly greater delayed transit (6 seconds greater than the contralateral side) in the deep watershed territory/centrum semiovale. Cerebral blood flow is preserved. Cerebral blood volume is also normal to mildly increased. No diminished CBF/CBV suggestive of acute infarction is seen. IMPRESSION: 1. 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 2. Prolonged transit time throughout  the right MCA territory due to #1 with compensated cerebral blood flow parameters. No evidence of acute infarct. 3. Chronically decreased number of distal right MCA branch vessels. No evidence of acute major branch occlusion. 4. Severe right V4 vertebral artery stenosis. 5. 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 6. Aortic atherosclerosis. 7. 8 mm left submandibular duct calculus. 8. Interlobular septal thickening and ground-glass opacities in lung apices, possibly reflecting edema. Correlate with chest radiography. These results were called by telephone at the time of interpretation on 11/03/2015 at 4:00 pm to Dr. Desmond Lope, who verbally acknowledged these results. Electronically Signed   By: Sebastian Ache M.D.   On: 11/03/2015 16:31   Mr Foot Left W Wo Contrast  Result Date: 11/02/2015 CLINICAL DATA:  Diabetic foot ulcer, assessment for osteomyelitis. EXAM: MRI OF THE LEFT FOREFOOT WITHOUT AND WITH CONTRAST TECHNIQUE: Multiplanar, multisequence MR imaging was performed both before and after administration of intravenous contrast. CONTRAST:  15mL MULTIHANCE GADOBENATE DIMEGLUMINE 529 MG/ML IV SOLN COMPARISON:  10/30/2015  FINDINGS: Osteomyelitis protocol MRI of the foot was obtained, to include the entire foot and ankle. This protocol uses a large field of view to cover the entire foot and ankle, and is suitable for assessing bony structures for osteomyelitis. Due to the large field of view and imaging plane choice, this protocol is less sensitive for assessing small structures such as ligamentous structures of the foot and ankle, compared to a dedicated forefoot or dedicated hindfoot exam. Despite efforts by the technologist and patient, motion artifact is present on today's exam and could not be eliminated. This reduces exam sensitivity and specificity. Abnormal edema and enhancement involving the distal head and metaphysis of the fifth metatarsal and adjacent proximal half of the small toe proximal phalanx compatible with osteomyelitis. Lateral ulceration along the fifth MTP joint probably communicates with the joint on image 33/6. Extensive subcutaneous edema along the plantar foot at the level of the midfoot and forefoot, although I do not see a drainable abscess this may well be spontaneously draining to the plantar foot given the overlying skin irregularity. There is edema and likely some enhancement along the plantar foot musculature. Thickened proximal plantar fascia. Dorsal subcutaneous edema in the foot. IMPRESSION: 1. Ulceration lateral to the fifth MTP joint appears to extend all the way down into the joint. There is osteomyelitis distally in the fifth metatarsal and also in the proximal phalanx of the fifth toe. No other osteomyelitis in the foot is identified. 2. Suspected ulcerations along the plantar foot with confluent phlegmon tracking in the plantar foot, primarily within the subcutaneous tissues but also along the deep margin of the medial band of the plantar fascia. I suspected any early abscess in this region is spontaneously draining. 3. Cellulitis along the dorsum of the foot. Electronically Signed   By:  Gaylyn Rong M.D.   On: 11/02/2015 07:31   Ct Cerebral Perfusion W Contrast  Result Date: 11/03/2015 CLINICAL DATA:  Left-sided weakness and aphasia. EXAM: CT ANGIOGRAPHY HEAD AND NECK CT CEREBRAL PERFUSION WITH CONTRAST TECHNIQUE: Multidetector CT imaging of the head and neck was performed using the standard protocol during bolus administration of intravenous contrast. Multiplanar CT image reconstructions and MIPs were obtained to evaluate the vascular anatomy. Carotid stenosis measurements (when applicable) are obtained utilizing NASCET criteria, using the distal internal carotid diameter as the denominator. CT cerebral perfusion imaging was also performed during the dynamic injection of intravenous contrast. Post processing was performed. CONTRAST:  90 mL Isovue 370  COMPARISON:  Head MRI/ MRA 09/20/2013 FINDINGS: CTA NECK FINDINGS Aortic arch: Three vessel aortic arch with extensive atherosclerotic calcified plaque. There is approximately 60% stenosis of the proximal brachiocephalic artery and proximal right subclavian artery. Atherosclerotic plaque in a focal when or intimal flap in the proximal left subclavian artery contribute to 65% stenosis. Right carotid system: Common carotid artery is patent without stenosis. Internal carotid artery is occluded at its origin without reconstitution in the neck. There is mild narrowing of the ECA origin. Left carotid system: Moderate atherosclerotic plaque at the carotid bifurcation without significant common or internal carotid artery stenosis. Mild proximal ECA stenosis. Vertebral arteries: Vertebral arteries are patent and codominant. There is mild right V2 narrowing at the C2 and C5 levels due to calcified plaque. There is mild left V2 narrowing due to degenerative vertebral spurring at C5-6 and C6-7. Skeleton: Moderate to advanced disc degeneration from C4-5 to C6-7. Other neck: 8 mm calculus in the distal left submandibular duct without significant ductal  dilatation or inflammatory change. Small left thyroid nodule. Upper chest: Interlobular septal thickening in the lung apices with partially visualized ground-glass opacity posteriorly in the left upper lobe and superior segment segments of the left greater than right upper lobes. Review of the MIP images confirms the above findings CTA HEAD FINDINGS Anterior circulation: Chronically occluded right ICA with reconstitution of the terminus via a small but patent right A1 segment, unchanged. Right M1 segment is widely patent. There is at most mild narrowing of the proximal right M2 inferior division. Decreased number of distal right MCA branch vessels in the right parietal region is chronic. Intracranial left ICA is patent with extensive atherosclerosis though only resulting in at most mild stenosis. 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 Left A1 segment is widely patent, as is the anterior communicating artery. A2 segments are patent. Left MCA is patent without evidence of major branch occlusion or significant proximal stenosis. Posterior circulation: Intracranial vertebral arteries are patent to the basilar with prominent atherosclerosis, greater on the right. There is severe proximal right V4 stenosis with additional moderate tandem stenoses. PICA origins appear patent, potentially with a high-grade stenosis on the right. AICA and SCA origins are patent. Basilar artery is patent without stenosis. Posterior communicating arteries are not identified. PCAs are patent with mild distal left P1 narrowing. No aneurysm. Venous sinuses: Patent. Anatomic variants: Small right A1. Review of the MIP images confirms the above findings CT CEREBRAL PERFUSION FINDINGS There is mildly prolonged transit time throughout most of the right MCA territory, with slightly greater delayed transit (6 seconds greater than the  contralateral side) in the deep watershed territory/centrum semiovale. Cerebral blood flow is preserved. Cerebral blood volume is also normal to mildly increased. No diminished CBF/CBV suggestive of acute infarction is seen. IMPRESSION: 1. 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 2. Prolonged transit time throughout the right MCA territory due to #1 with compensated cerebral blood flow parameters. No evidence of acute infarct. 3. Chronically decreased number of distal right MCA branch vessels. No evidence of acute major branch occlusion. 4. Severe right V4 vertebral artery stenosis. 5. 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 6. Aortic atherosclerosis. 7. 8 mm left submandibular duct calculus. 8. Interlobular septal thickening and ground-glass opacities in lung apices, possibly reflecting edema. Correlate with chest radiography. These results were called by telephone at the time of interpretation on  11/03/2015 at 4:00 pm to Dr. Desmond Lope, who verbally acknowledged these results. Electronically Signed   By: Sebastian Ache M.D.   On: 11/03/2015 16:31   Dg Chest Port 1 View  Result Date: 11/03/2015 CLINICAL DATA:  Right central venous line placement EXAM: PORTABLE CHEST 1 VIEW COMPARISON:  08/18/2015 chest radiograph. FINDINGS: Right internal jugular central venous catheter terminates in the upper third of the superior vena cava. Stable cardiomediastinal silhouette with normal heart size with aortic atherosclerosis. No pneumothorax. No pleural effusion. Mild diffuse prominence of the central interstitial markings. No acute consolidative airspace disease. IMPRESSION: 1. Right internal jugular central venous catheter terminates in the upper third of the superior vena cava. No pneumothorax. 2. Top-normal heart size. Prominence of the central interstitial markings, favor vascular crowding due to low lung volumes . 3. Aortic atherosclerosis. Electronically  Signed   By: Delbert Phenix M.D.   On: 11/03/2015 17:31   Dg Ang/ext/uni/or Left  Result Date: 11/02/2015 CLINICAL DATA:  65 year old female undergoing distal bypass EXAM: LEFT ANG/EXT/UNI/ OR COMPARISON:  Prior intraoperative angiographic images 8 09/17/2015 FINDINGS: Single cross-table lateral intraoperative image demonstrates what appears to be a patent saphenous vein bypass graft attaching to the distal popliteal artery below the knee joint. The popliteal artery is diffusely diseased. Primary runoff to the ankle is via the peroneal artery. The anterior tibial artery is occluded proximally but reconstitutes distally. The posterior tibial artery is occluded. IMPRESSION: Intraoperative arteriogram as above. Electronically Signed   By: Malachy Moan M.D.   On: 11/02/2015 14:26   Ct Head Code Stroke Wo Contrast  Addendum Date: 11/03/2015   ADDENDUM REPORT: 11/03/2015 15:12 ADDENDUM: These results were called by telephone at the time of interpretation on 11/03/2015 at 3:05 pm to Dr. Desmond Lope, who verbally acknowledged these results. Electronically Signed   By: Sebastian Ache M.D.   On: 11/03/2015 15:12   Result Date: 11/03/2015 CLINICAL DATA:  Code stroke. Slurred speech with left-sided deficits. EXAM: CT HEAD WITHOUT CONTRAST TECHNIQUE: Contiguous axial images were obtained from the base of the skull through the vertex without intravenous contrast. COMPARISON:  Brain MRI 03/03/2013 and CT 03/02/2013 FINDINGS: Brain: Mild motion artifact. There is no evidence of acute cortical infarct, intracranial hemorrhage, mass, midline shift, or extra-axial fluid collection. Ventricles and sulci are normal in size for age. Patchy cerebral white matter hypodensities are stable to slightly progressive and nonspecific but compatible with mild chronic small vessel ischemic disease. A small chronic left cerebellar infarct is unchanged. Vascular: Calcified atherosclerosis at the skullbase. Skull: No fracture or focal osseous  lesion. Sinuses/Orbits: Prior right cataract extraction.  Clear sinuses. Other: None. ASPECTS [facility] Stroke Program Early CT Score) - Ganglionic level infarction (caudate, lentiform nuclei, internal capsule, insula, M1-M3 cortex): 7 - Supraganglionic infarction (M4-M6 cortex): 3 Total score (0-10 with 10 being normal): 10 IMPRESSION: 1. No evidence of acute intracranial abnormality. 2. ASPECTS is 10. 3. Mild chronic small vessel ischemic disease. Chronic left cerebellar infarct. Electronically Signed: By: Sebastian Ache M.D. On: 11/03/2015 14:48      Assessment/Plan:  INTERVAL HISTORY:  Sp VVS surgery with bypass graft  Patient with confusion, earlier LUE weakness on Dr. Adele Dan exam later dropped hemoglobin to 5.8, later developed shock intubated and sent to the ICU    Principal Problem:   Osteomyelitis of left foot (HCC) Active Problems:   Diabetes mellitus type II, uncontrolled (HCC)   Hyperlipidemia with target LDL less than 70   PVD (peripheral vascular disease) with claudication (HCC)  Essential hypertension   Malnutrition of moderate degree   Diabetic foot ulcer (HCC)   S/P femoral-popliteal bypass surgery   Shock (HCC)   Encephalopathy acute   Acute blood loss anemia   Acute respiratory failure with hypoxia (HCC)    [name] [name] is a 65 y.o. female with with  Poorly controlled diabetes mellitus with peripheral vascular disease diabetic foot ulcers arterial disease and osteomyelitis of third fourth and fifth digits sp Bypass surgery but unfortunately with deterioration postoperatively with confusion, drop in hemoglobin, and shock sp intubation   #1 Diabetic foot ulcers with osteomyelitis now with shock:  Patient was started on vancomycin and aztreonam but he has tolerated cephalosporins with [place]  I am changing to vancomycin and cefepime  Dr. Lajoyce Corners hoping to perform amputation of digits when patient stable  I would appreciate cultures from tissue and bone even  though pt will have been on broad spectrum antibioitics  #2 ? CVA: wonder if the patient had a hypoperfusion related CNS event due to blood loss +/- infection  Dr. Ninetta Lights will check on the patient over the weekend.     LOS: 4 days   Acey Lav 11/03/2015, 7:09 PM

## 2015-11-03 NOTE — Progress Notes (Signed)
[facility]  Patient Status: Active (receiving services up to time of hospitalization)  [facility] is providing the following services: RN and PT  If patient discharges after hours, please call [phone].   [name] [name] 11/03/2015, 11:36 AM

## 2015-11-03 NOTE — Progress Notes (Addendum)
[facility]  Subjective  - called by Dr Delton Coombes to eval pt for hypotension with decreased hemoglobin. Pt had left fem pop by [facility] yesterday.  She was seen by Dr Edilia Bo at 7 am this morning and no comment made regarding any concerns for bleeding.  Pt noted to be lethargic around 2 pm today and code stroke called.  Seen by Neurology thought to have probable stroke and not moving her left lower or upper extremity.  She then developed hypotension and was transferred to ICU.  She was on heparin which has been stopped.   Objective (!) 86/70 (!) 128 98.6 F (37 C) (Oral) (!) 30 95%  Intake/Output Summary (Last 24 hours) at 11/03/15 1820 Last data filed at 11/03/15 1745  Gross per 24 hour  Intake          2083.03 ml  Output              645 ml  Net          1438.03 ml   Left lower extremity thigh overall soft but some firmness in the mid thigh probably small to medium hematoma, thigh not tight, calf soft foot warm Drains minimal  Abdomen overall soft no guarding but exam may be unreliable due to mental status  Assessment/Planning: POD #1 left fem pop bypass now with possible CVA, sepsis, hypotension.  All of this may be multifactorial do not believe thigh hematoma is sole reason for instability.  Would transfuse as you are doing.  Stop heparin.  Broad spectrum antibiotics for purulent foot wound.  Continue stroke MI work up  Repeat hemoglobin post transfusion Check INR/PTT and make sure her coags are fully corrected  Daughter updated at bedside  Fabienne Bruns 11/03/2015 6:20 PM --  Laboratory Lab Results:  Recent Labs  11/02/15 1536 11/03/15 1612  WBC 19.8* 17.4*  HGB 8.2* 5.8*  HCT 25.7* 18.0*  PLT 339 342   BMET  Recent Labs  11/03/15 0416 11/03/15 1612  NA 139 137  K 4.2 4.3  CL 108 109  CO2 25 20*  GLUCOSE 81 114*  BUN 12 14  CREATININE 0.62 0.64  CALCIUM 8.1* 7.8*    COAG Lab Results  Component Value Date   INR 1.22  11/02/2015   INR 1.1 09/19/2015   INR 1.13 09/05/2015   No results found for: PTT

## 2015-11-03 NOTE — Procedures (Signed)
Central Venous Catheter Insertion Procedure Note [name] [name] [id number] 02/06/1950  Procedure: Insertion of Central Venous Catheter Indications: Assessment of intravascular volume and Drug and/or fluid administration  Procedure Details Consent: Risks of procedure as well as the alternatives and risks of each were explained to the (patient/caregiver).  Consent for procedure obtained. Time Out: Verified patient identification, verified procedure, site/side was marked, verified correct patient position, special equipment/implants available, medications/allergies/relevent history reviewed, required imaging and test results available.  Performed  Maximum sterile technique was used including antiseptics, cap, gloves, gown, hand hygiene, mask and sheet. Skin prep: Chlorhexidine; local anesthetic administered A antimicrobial bonded/coated triple lumen catheter was placed in the right internal jugular vein using the Seldinger technique.  Evaluation Blood flow good Complications: No apparent complications Patient did tolerate procedure well. Chest X-ray ordered to verify placement.  CXR: Line in good position.   Levy Pupa, MD, PhD 11/03/2015, 5:50 PM [facility] [phone] or if no answer [phone]

## 2015-11-03 NOTE — Consult Note (Signed)
PULMONARY / CRITICAL CARE MEDICINE   Name: [name] [name] MRN: [id number] DOB: May 14, 1950    ADMISSION DATE:  10/30/2015 CONSULTATION DATE:  11/03/15  REFERRING MD:  Dr Edward Jolly, [facility]  CHIEF COMPLAINT:  Altered MS and shock  HISTORY OF PRESENT ILLNESS:   65 yo woman, hx PVD / PAD, L foot plantar abscess, s/p L fem-pop bypass on 11/02/15. Evaluated by Dr Lajoyce Corners 10/6 and recommended surgical debridement. She was awake and interacting normally as of 12:20pm, then noted to have slurred speech, L sided weakness, altered MS. She had received narcs around 10:00 so was given narcan. She responded with improved MS, wakefulness. A head CT ruled out acute bleed. Through the pm she has had progressive lethargy, garbled speech, poor airway management and now shock. She was started on dopamine and PCCM was urgently consulted to evaluate. Note neurology has been consulted for suspected acute post-op CVA. ABG showed adequate ventilation and oxygenation . Moves to ICU   PAST MEDICAL HISTORY :  She  has a past medical history of Arthritis; Complication of anesthesia; Coronary artery disease; Family history of adverse reaction to anesthesia; GERD (gastroesophageal reflux disease); Hip bursitis; Hyperlipidemia LDL goal < 70 (06/28/2013); Hypertension; Migraine; Myocardial infarction (2011); Neuromuscular disorder (HCC); PAD (peripheral artery disease) (HCC); Peripheral vascular disease (HCC); and Type II diabetes mellitus (HCC).  PAST SURGICAL HISTORY: She  has a past surgical history that includes PERIPHERAL VASCULAR ANGIOGRAM (01/26/2010); PERIPHERAL VASCULAR ANGIOGRAM (02/23/2010); PERIPHERAL VASCULAR ANGIOGRAM (06/17/2010); PERIPHERAL VASCULAR ANGIOGRAM (06/04/2011); CAROTID DUPLEX (03/19/2011); LEXISCAN MYOVIEW (10/25/2010); transthoracic echocardiogram (10/17/2009); atherectomy (N/A, 06/04/2011); PERIPHERAL VASCULAR ANGIOGRAM (08/28/2015); Iliac artery stent (Left, 08/28/2015); Appendectomy; O[place] surgery (1983?); Cesarean section  (1990); Coronary angioplasty; Cardiac catheterization (10/13/2009); Cardiac catheterization (N/A, 08/28/2015); Cardiac catheterization (N/A, 08/28/2015); Cardiac catheterization (Left, 08/28/2015); Cardiac catheterization (Left, 08/28/2015); Endarterectomy femoral (Left, 09/05/2015); Intraoperative arteriogram (Left, 09/05/2015); Abdominal hysterectomy; Cataract extraction w/ intraocular lens implant (Right); Cardiac catheterization (N/A, 09/28/2015); Cardiac catheterization (Left, 09/28/2015); Femoral-popliteal Bypass Graft (Left, 11/02/2015); Vein harvest (Left, 11/02/2015); and Intraoperative arteriogram (Left, 11/02/2015).  Allergies  Allergen Reactions  . Hydrochlorothiazide Other (See Comments)    Unknown  . Latex Rash  . Penicillins Swelling and Rash    Pt states she has tolerated Keflex in the past without problems. States she may have tolerated Augmentin in the past but it caused GI upset.    No current facility-administered medications on file prior to encounter.    Current Outpatient Prescriptions on File Prior to Encounter  Medication Sig  . amLODipine (NORVASC) 5 MG tablet TAKE 1 TABLET(5 MG) BY MOUTH DAILY  . aspirin EC 81 MG tablet Take 81 mg by mouth daily.  Marland Kitchen atorvastatin (LIPITOR) 80 MG tablet Take 1 tablet (80 mg total) by mouth daily.  . clopidogrel (PLAVIX) 75 MG tablet Take 1 tablet (75 mg total) by mouth daily. Holding for vascular surgery, restart as soon as possible after leg surgery (after 8/8)  . isosorbide mononitrate (IMDUR) 60 MG 24 hr tablet Take 90 mg by mouth daily.   Marland Kitchen losartan (COZAAR) 100 MG tablet TAKE 1 TABLET BY MOUTH EVERY DAY  . metFORMIN (GLUCOPHAGE) 1000 MG tablet Take 1,000 mg by mouth 2 (two) times daily with a meal.  . metoprolol succinate (TOPROL-XL) 50 MG 24 hr tablet Take 1 tablet (50 mg total) by mouth daily.  Marland Kitchen oxyCODONE-acetaminophen (PERCOCET/ROXICET) 5-325 MG tablet Take 1-2 tablets by mouth every 6 (six) hours as needed for moderate pain.  .  pantoprazole (PROTONIX) 40 MG tablet Take 1 tablet (  40 mg total) by mouth daily.  . Insulin Glargine (LANTUS) 100 UNIT/ML Solostar Pen Inject 35 Units into the skin daily at 10 pm.   . OVER THE COUNTER MEDICATION Apply 1 application topically daily. [place]    FAMILY HISTORY:  Her indicated that her mother is deceased. She indicated that her father is deceased.    SOCIAL HISTORY: She  reports that she quit smoking about 6 years ago. Her smoking use included Cigarettes. She has a 61.50 pack-year smoking history. She has never used smokeless tobacco. She reports that she does not drink alcohol or use drugs.  REVIEW OF SYSTEMS:   Unable to obtain due to MS  SUBJECTIVE:  Garbled speech Dopamine just started, remains hypotensive  VITAL SIGNS: BP 123/72 (BP Location: Left Arm)   Pulse 98   Temp 99.4 F (37.4 C) (Oral)   Resp (!) 24   Ht 5\' 4"  (1.626 m)   Wt 72 kg (158 lb 11.7 oz)   SpO2 97%   BMI 27.25 kg/m   HEMODYNAMICS:    VENTILATOR SETTINGS:    INTAKE / OUTPUT: I/O last 3 completed shifts: In: 4023.3 [P.O.:60; I.V.:3413.3; IV Piggyback:550] Out: 1810 [Urine:1425; Drains:135; Blood:250]  PHYSICAL EXAMINATION: General:  Ill appearing woman,  Neuro:  Somnolent, wakes to sternal rub and has garbled speech, weak on the L,  HEENT:  Secretions pooling,  Cardiovascular:  Tachycardic, no M Lungs:  Coarse bilaterally, no wheezing Abdomen:  Soft, NT, + BS Musculoskeletal:  L LE staples CDI, L dorsal foot wound dressed.  Skin:  Cool,   LABS:  BMET  Recent Labs Lab 11/01/15 0647 11/02/15 0330 11/03/15 0416  NA 138 138 139  K 4.3 3.7 4.2  CL 103 104 108  CO2 27 27 25   BUN 13 13 12   CREATININE 0.71 0.77 0.62  GLUCOSE 120* 133* 81    Electrolytes  Recent Labs Lab 11/01/15 0647 11/02/15 0330 11/02/15 2202 11/03/15 0416  CALCIUM 8.7* 8.5*  --  8.1*  MG  --   --  1.2*  --     CBC  Recent Labs Lab 11/01/15 0647 11/02/15 0330 11/02/15 1536  WBC 8.9  9.9 19.8*  HGB 8.9* 8.3* 8.2*  HCT 27.6* 25.1* 25.7*  PLT 342 339 339    Coag's  Recent Labs Lab 11/02/15 0330 11/03/15 0416  APTT  --  42*  INR 1.22  --     Sepsis Markers No results for input(s): LATICACIDVEN, PROCALCITON, O2SATVEN in the last 168 hours.  ABG  Recent Labs Lab 11/03/15 1553  PHART 7.440  PCO2ART 30.3*  PO2ART 69.7*    Liver Enzymes No results for input(s): AST, ALT, ALKPHOS, BILITOT, ALBUMIN in the last 168 hours.  Cardiac Enzymes No results for input(s): TROPONINI, PROBNP in the last 168 hours.  Glucose  Recent Labs Lab 11/03/15 0358 11/03/15 0759 11/03/15 0828 11/03/15 1119 11/03/15 1324 11/03/15 1544  GLUCAP 82 62* 90 189* 107* 108*    Imaging Ct Head Code Stroke Wo Contrast  Addendum Date: 11/03/2015   ADDENDUM REPORT: 11/03/2015 15:12 ADDENDUM: These results were called by telephone at the time of interpretation on 11/03/2015 at 3:05 pm to Dr. Desmond Lope, who verbally acknowledged these results. Electronically Signed   By: Sebastian Ache M.D.   On: 11/03/2015 15:12   Result Date: 11/03/2015 CLINICAL DATA:  Code stroke. Slurred speech with left-sided deficits. EXAM: CT HEAD WITHOUT CONTRAST TECHNIQUE: Contiguous axial images were obtained from the base of the skull through the vertex  without intravenous contrast. COMPARISON:  Brain MRI 03/03/2013 and CT 03/02/2013 FINDINGS: Brain: Mild motion artifact. There is no evidence of acute cortical infarct, intracranial hemorrhage, mass, midline shift, or extra-axial fluid collection. Ventricles and sulci are normal in size for age. Patchy cerebral white matter hypodensities are stable to slightly progressive and nonspecific but compatible with mild chronic small vessel ischemic disease. A small chronic left cerebellar infarct is unchanged. Vascular: Calcified atherosclerosis at the skullbase. Skull: No fracture or focal osseous lesion. Sinuses/Orbits: Prior right cataract extraction.  Clear sinuses. Other:  None. ASPECTS [facility] Stroke Program Early CT Score) - Ganglionic level infarction (caudate, lentiform nuclei, internal capsule, insula, M1-M3 cortex): 7 - Supraganglionic infarction (M4-M6 cortex): 3 Total score (0-10 with 10 being normal): 10 IMPRESSION: 1. No evidence of acute intracranial abnormality. 2. ASPECTS is 10. 3. Mild chronic small vessel ischemic disease. Chronic left cerebellar infarct. Electronically Signed: By: Sebastian AcheAllen  Grady M.D. On: 11/03/2015 14:48     STUDIES:  MRI L foot >> Head Ct / CTA 10/6  >> no acute bleed, chronic near complete occlusion    CULTURES: Blood 10/6 >>  MRSA screen 10/3 >> negative  ANTIBIOTICS: vanco 10/6 >>  Imipenem 10/6 >>   SIGNIFICANT EVENTS: 10/5 s/p L fem-pop bypass 10/6 to ICU with acute shock and AMS  LINES/TUBES:  DISCUSSION: 65 yo woman w PVD, L plantar foot abscess + osteo, s/p L fem-pop abscess 10/5. Was well until 12:00 10/6, developed lethargy, AMS, shock. Responded transiently to narcan. Initial eval has revealed acute anemia, now receiving PRBC. Will cover for possible sepsis, assess for cardiac contribution. Will discuss with vascular.   ASSESSMENT / PLAN:  PULMONARY A: Hypoxemia At risk acute resp failure due to suppressed MS P:   pulm hygiene  Low threshold to intubate for airway protection.   CARDIOVASCULAR A:  Shock, suspect hemorrhagic, sepsis due to foot wound, to osteomyelitis, acute MI / cardiogenic P:  Stat CBC Follow troponin, ECG CVC placement for pressors and CVP's  RENAL A:   No acute issues, at risk for ARF P:   Follow BMP and UOP  GASTROINTESTINAL A:   SUP / Hx GERD Nutrition P:   NPO for now given potential need for procedures.  Pepcid IV  HEMATOLOGIC A:   Acute Anemia, ? L thigh, ? Retroperitoneal  P:  STAT type and screen 2 U PRBC now Serial CBC's Will discuss with vascular regarding any imaging L leg that may be helpful Consider Ct abd to eval retroperitoneal bleed if no  evidence L thigh  INFECTIOUS A:   Possible sepsis, septic shock L foot plantar abscess + toe osteomyelitis P:   Blood cx sent Empiric vanco + imipenem 10/6  ENDOCRINE A:   DM 2   P:   SSI coverage  NEUROLOGIC A:   Acute altered MS, consider acute CVA although MS and LUE movement have improved some w time and narcan P:   RASS goal: 0 Head CT with no acute bleed Still at some risk that this was CVA, appreciate neuro recs  Continue ASA Minimize sedating meds Obtain MRI brain when stable to do so   FAMILY  - Updates: discussed status with family 10/6  - Inter-disciplinary family meet or Palliative Care meeting due by:  11/10/15   Independent CC time 80 minutes  Levy Pupaobert Mael Delap, MD, PhD 11/03/2015, 5:42 PM [facility] [phone] or if no answer [phone]

## 2015-11-03 NOTE — Progress Notes (Signed)
Inpatient Diabetes Program Recommendations  AACE/ADA: New Consensus Statement on Inpatient Glycemic Control (2015)  Target Ranges:  Prepandial:   less than 140 mg/dL      Peak postprandial:   less than 180 mg/dL (1-2 hours)      Critically ill patients:  140 - 180 mg/dL   Results for TEUILA, MARSICANO (MRN [id number]) as of 11/03/2015 09:25  Ref. Range 11/02/2015 00:03 11/02/2015 05:32 11/02/2015 06:31 11/02/2015 08:40 11/02/2015 15:12 11/02/2015 16:25 11/02/2015 17:54 11/02/2015 20:21  Glucose-Capillary Latest Ref Range: 65 - 99 mg/dL 427 (H) 670 (H) 98 110 (H) 63 (L) 94 90 100 (H)   Results for WINDA, MARCUSSEN (MRN [id number]) as of 11/03/2015 09:25  Ref. Range 11/03/2015 00:30 11/03/2015 03:58 11/03/2015 07:59 11/03/2015 08:28  Glucose-Capillary Latest Ref Range: 65 - 99 mg/dL 353 (H) 82 62 (L) 90    Home DM Meds: Lantus 35 units QHS       Metformin 1000 mg bid  Current Insulin Orders: Lantus 35 units QHS      Novolog Moderate Correction Scale/ SSI (0-15 units) Q4 hours     MD- Patient having issues with Hypoglycemia yesterday afternoon and again this AM.  Please consider reducing Lantus to 28 units QHS (20% reduction for now)     --Will follow patient during hospitalization--  Ambrose Finland RN, MSN, CDE Diabetes Coordinator Inpatient Glycemic Control Team Team Pager: [phone] (8a-5p)

## 2015-11-03 NOTE — Procedures (Signed)
Intubation Procedure Note ENNIS CRITE [id number] Jul 20, 1950  Procedure: Intubation Indications: Airway protection and maintenance  Procedure Details Consent: Risks of procedure as well as the alternatives and risks of each were explained to the (patient/caregiver).  Consent for procedure obtained. Time Out: Verified patient identification, verified procedure, site/side was marked, verified correct patient position, special equipment/implants available, medications/allergies/relevent history reviewed, required imaging and test results available.  Performed  Maximum sterile technique was used including gloves, gown, hand hygiene and mask.  MAC and 3    Evaluation Hemodynamic Status: BP stable throughout; O2 sats: stable throughout Patient's Current Condition: stable Complications: No apparent complications Patient did tolerate procedure well. Chest X-ray ordered to verify placement.  CXR: pending.   Margot Oriordan S. 11/03/2015

## 2015-11-03 NOTE — Progress Notes (Addendum)
eLink Physician-Brief Progress Note Patient Name: [name] [name] DOB: April 27, 1950 MRN: [id number]   Date of Service  11/03/2015  HPI/Events of Note  Acute increase in SOB, WOB and decreased O2 sat to 85% after getting 3 Liters of crystalloid and 2 units of PRBC. BP = 160/75.  eICU Interventions  Will order: 1. BiPAP - IPAP 12/EPAP 5. Keep sat >= 92%. 2. Lasix 40 mg IV STAT. 3. Will inform ground team.      Intervention Category Intermediate Interventions: Respiratory distress - evaluation and management  Sommer,Steven Eugene 11/03/2015, 6:21 PM

## 2015-11-03 NOTE — Progress Notes (Signed)
Interval event   Patient a nurse because patient was lethargic. Per nursing staff should has received all. Medications were playing under examinations reveal blood contracted reactive to light. Narcan was given. Mild response to Narcan was observed. Subsequently patient became lethargic again and left-sided weakness is noted also confused and disoriented unable to speak. Patient was rushed to CT scan code stroke was called. CT showed no acute hemorrhage. Patient while in CT scan blood pressure was dropping 70/40. IV fluids were given but patient did not respond.  Patient was back to stepdown unit. Her dopamine was started, critical care was consulted, ABG, lactic acid is CBC BMP, blood cultures, urine culture, and troponins were ordered. Other dose of Narcan was given the patient remains unresponsive. She now on 100% oxygen, dopamine at 10 mL/h IV fluids running.   Assessment and plan 65 year old female admitted for osteomyelitis left foot likely secondary to PVD and diabetes. Underwent revascularization of left lower extremity. Now hypotensive, AMS and lethargic. - In view of patient being heparinized need to rule out bleed - CT scan shows no intracranial hemorrhage, CBC pending. Patient started on pressors be slightly improved. Need to rule out sepsis versus early signs of ischemic stroke (high risk, PVD, diabetes, hypertension, smoker). Will transfer patient to ICU unit, continue pressors follow-up labs. Will defer further management to PCCM (appreciate support).   Latrelle Dodrill, MD [facility] P [phone]

## 2015-11-03 NOTE — Progress Notes (Signed)
Patient's respiratory drive began to decompensate. Patient began to desat, and lungs sounded wet and crackles at beside. Notied Elink RN Sarah and MD Arsenio Loader in regards to patient going into possibly flash pulmonary edema due to receiving numerous products of fluids rapidly because of low BP. Will continue to monitor and assess.

## 2015-11-03 NOTE — Consult Note (Signed)
ORTHOPAEDIC CONSULTATION  REQUESTING PHYSICIAN: Lenox Ponds, MD  Chief Complaint: Abscess left foot status post revascularization.  HPI: [name] [name] is a 65 y.o. female who presents with abscess left foot. She underwent revascularization to left lower extremity. Patient has persistent abscess plantar aspect left foot with acute change in her neurologic status.  Past Medical History:  Diagnosis Date  . Arthritis    "feel like I have it all over" (08/28/2015)  . Complication of anesthesia    DIFFICULT WAKING "only when I was smoking; no problems since I quit"  . Coronary artery disease   . Family history of adverse reaction to anesthesia    sister slow to wake up  . GERD (gastroesophageal reflux disease)    takes Protonix daily   . Hip bursitis   . Hyperlipidemia LDL goal < 70 06/28/2013   takes Atorvastatin daily  . Hypertension    takes Metoprolol and Imdur daily  . Migraine    "none in a long time" (08/28/2015)  . Myocardial infarction 2011  . Neuromuscular disorder (HCC)    DIABETIC NEUROPATHY  . PAD (peripheral artery disease) (HCC)   . Peripheral vascular disease (HCC)   . Type II diabetes mellitus (HCC)    takes Lantus nightly.Average fasting blood sugar runs 80-90   Past Surgical History:  Procedure Laterality Date  . ABDOMINAL HYSTERECTOMY    . APPENDECTOMY    . ATHERECTOMY N/A 06/04/2011   Procedure: ATHERECTOMY;  Surgeon: Runell Gess, MD;  Location: [facility] CATH LAB;  Service: Cardiovascular;  Laterality: N/A;  . CARDIAC CATHETERIZATION  10/13/2009   95% stenosis in the AV groove circumflex and 95% ostial stenosis in small OM3. A 3x52mm drug-eluting Promus stent inserted ito the circumflex. Dilatated with a 3.25x50mm noncompliant Quantum balloon within entire segment. The entire region was reduced to 0% and brisk TIMI3 flow.  . CAROTID DUPLEX  03/19/2011   Right ICA-demonstrates complete occlusion. Left ICA-demonstrates a small amount of fibrous plaque.  Marland Kitchen  CATARACT EXTRACTION W/ INTRAOCULAR LENS IMPLANT Right   . CESAREAN SECTION  1990  . CORONARY ANGIOPLASTY    . ENDARTERECTOMY FEMORAL Left 09/05/2015   Procedure: ENDARTERECTOMY FEMORAL WITH PROFUNDOPLASTY;  Surgeon: Chuck Hint, MD;  Location: [facility] OR;  Service: Vascular;  Laterality: Left;  Left common femoral artery vein patch using left saphenous vien  . ILIAC ARTERY STENT Left 08/28/2015   common  . INTRAOPERATIVE ARTERIOGRAM Left 09/05/2015   Procedure: INTRA OPERATIVE ARTERIOGRAM;  Surgeon: Chuck Hint, MD;  Location: [facility] OR;  Service: Vascular;  Laterality: Left;  . LEXISCAN MYOVIEW  10/25/2010   Moderate perfusion defect due to infarct/scar with mild perinfarct ischemia seen in the Basal Inferolateral, Basal Anterolateral, Mid Inferolateral, and Mid Anterolateral regions. Post-stress EF is 50%.  . OVARY SURGERY  1983?   "ruptured"  . PERIPHERAL VASCULAR ANGIOGRAM  01/26/2010   High-grade SFA disease: left greater than right. Left SFA would require fem-pop bypass grafting. Right SFA could be stented but might require Diamondback Orbital atherectomy.  Marland Kitchen PERIPHERAL VASCULAR ANGIOGRAM  02/23/2010   Stealth Predator orbital rotational atherectomy performed on SFA & Popliteal up to 90,000 RPM. Stenting using overlapping 5x167mm and 5x41mm Absolute Pro Nitinol self-expanding stents beginning just at the knee up to the mid SFA resulting in reduction of 90-95% calcified SFA & Popliteal stenosis to 0. Stenting performed on the distal common & proximal iliac artery with a 10x4 Absolute Pro- 70-0%.  Marland Kitchen PERIPHERAL VASCULAR ANGIOGRAM  06/17/2010  PTA performed to the right external iliac artery stent using a 5x100 balloon at 10 atmospheres. Stenting performed using a 6x18 Genesis on Opta balloon. Postdilatation with a 7x2 balloon resulting in a 95% "in-stent" stenosis to 0% residual.  . PERIPHERAL VASCULAR ANGIOGRAM  06/04/2011   Bilateral total SFAs not percutaneously addressable. Good  canidate for femoropopliteal bypass grafting  . PERIPHERAL VASCULAR ANGIOGRAM  08/28/2015  . PERIPHERAL VASCULAR CATHETERIZATION N/A 08/28/2015   Procedure: Lower Extremity Angiography;  Surgeon: Runell GessJonathan J Berry, MD;  [facility];  Service: Cardiovascular;  Laterality: N/A;  . PERIPHERAL VASCULAR CATHETERIZATION N/A 08/28/2015   Procedure: Abdominal Aortogram;  Surgeon: Runell GessJonathan J Berry, MD;  Location: [facility] INVASIVE CV LAB;  Service: Cardiovascular;  Laterality: N/A;  . PERIPHERAL VASCULAR CATHETERIZATION Left 08/28/2015   Procedure: Peripheral Vascular Intervention;  Surgeon: Runell GessJonathan J Berry, MD;  [facility];  Service: Cardiovascular;  Laterality: Left;  common iliac  . PERIPHERAL VASCULAR CATHETERIZATION Left 08/28/2015   Procedure: Peripheral Vascular Atherectomy;  Surgeon: Runell GessJonathan J Berry, MD;  [facility];  Service: Cardiovascular;  Laterality: Left;  common iliac  . PERIPHERAL VASCULAR CATHETERIZATION N/A 09/28/2015   Procedure: Lower Extremity Angiography;  Surgeon: Runell GessJonathan J Berry, MD;  [facility];  Service: Cardiovascular;  Laterality: N/A;  . PERIPHERAL VASCULAR CATHETERIZATION Left 09/28/2015   Procedure: Peripheral Vascular Intervention;  Surgeon: Runell GessJonathan J Berry, MD;  [facility];  Service: Cardiovascular;  Laterality: Left CFA  PCI with 9 mm x 4 cm Abbott nitinol absolute Pro self-expanding stent     . TRANSTHORACIC ECHOCARDIOGRAM  10/17/2009   EF 45-50%, moderate hypokinesis of the entire inferolateral myocardium, mild concentric hypertrophy and mild regurg of the mitral valva.   Social History   Social History  . Marital status: Married    Spouse name: N/A  . Number of children: N/A  . Years of education: N/A   Occupational History  . retired    Social History Main Topics  . Smoking status: Former Smoker    Packs/day: 1.50    Years: 41.00    Types: Cigarettes    Quit date: 10/12/2009  .  Smokeless tobacco: Never Used  . Alcohol use No  . Drug use: No  . Sexual activity: Not Currently    Birth control/ protection: Surgical   Other Topics Concern  . None   Social History Narrative  . None   Family History  Problem Relation Age of Onset  . Hypertension Mother   . Heart failure Mother   . Heart failure Father   . Stroke Father   . Diabetes Father    - negative except otherwise stated in the family history section Allergies  Allergen Reactions  . Hydrochlorothiazide Other (See Comments)    Unknown  . Latex Rash  . Penicillins Swelling and Rash    Pt states she has tolerated Keflex in the past without problems. States she may have tolerated Augmentin in the past but it caused GI upset.   Prior to Admission medications   Medication Sig Start Date End Date Taking? Authorizing Provider  amLODipine (NORVASC) 5 MG tablet TAKE 1 TABLET(5 MG) BY MOUTH DAILY 03/28/15  Yes Runell GessJonathan J Berry, MD  aspirin EC 81 MG tablet Take 81 mg by mouth daily.   Yes Historical Provider, MD  atorvastatin (LIPITOR) 80 MG tablet Take 1 tablet (80 mg total) by mouth daily. 06/30/15  Yes Lennette Biharihomas A Kelly, MD  clopidogrel (PLAVIX) 75 MG tablet Take 1 tablet (75 mg total) by mouth daily. Holding for vascular surgery, restart as soon as possible after leg surgery (after 8/8) 08/29/15  Yes Azalee Course, PA  gabapentin (NEURONTIN) 300 MG capsule Take 300 mg by mouth 3 (three) times daily.  10/10/15  Yes Historical Provider, MD  isosorbide mononitrate (IMDUR) 60 MG 24 hr tablet Take 90 mg by mouth daily.    Yes Historical Provider, MD  ketoconazole (NIZORAL) 2 % cream Apply 1 application topically daily.  10/20/15  Yes Historical Provider, MD  losartan (COZAAR) 100 MG tablet TAKE 1 TABLET BY MOUTH EVERY DAY 10/31/14  Yes Lennette Bihari, MD  metFORMIN (GLUCOPHAGE) 1000 MG tablet Take 1,000 mg by mouth 2 (two) times daily with a meal.   Yes Historical Provider, MD  metoprolol succinate (TOPROL-XL) 50 MG 24 hr tablet  Take 1 tablet (50 mg total) by mouth daily. 09/11/15  Yes Lennette Bihari, MD  oxyCODONE-acetaminophen (PERCOCET/ROXICET) 5-325 MG tablet Take 1-2 tablets by mouth every 6 (six) hours as needed for moderate pain. 09/07/15  Yes Samantha J Rhyne, PA-C  pantoprazole (PROTONIX) 40 MG tablet Take 1 tablet (40 mg total) by mouth daily. 03/20/15  Yes Peter M Sw[place], MD  SANTYL ointment APP TOPICALLY UTD 10/05/15  Yes Historical Provider, MD  Insulin Glargine (LANTUS) 100 UNIT/ML Solostar Pen Inject 35 Units into the skin daily at 10 pm.  03/04/13   Catarina Hartshorn, MD  OVER THE COUNTER MEDICATION Apply 1 application topically daily. [place]    Historical Provider, MD   Mr Foot Left W Wo Contrast  Result Date: 11/02/2015 CLINICAL DATA:  Diabetic foot ulcer, assessment for osteomyelitis. EXAM: MRI OF THE LEFT FOREFOOT WITHOUT AND WITH CONTRAST TECHNIQUE: Multiplanar, multisequence MR imaging was performed both before and after administration of intravenous contrast. CONTRAST:  15mL MULTIHANCE GADOBENATE DIMEGLUMINE 529 MG/ML IV SOLN COMPARISON:  10/30/2015 FINDINGS: Osteomyelitis protocol MRI of the foot was obtained, to include the entire foot and ankle. This protocol uses a large field of view to cover the entire foot and ankle, and is suitable for assessing bony structures for osteomyelitis. Due to the large field of view and imaging plane choice, this protocol is less sensitive for assessing small structures such as ligamentous structures of the foot and ankle, compared to a dedicated forefoot or dedicated hindfoot exam. Despite efforts by the technologist and patient, motion artifact is present on today's exam and could not be eliminated. This reduces exam sensitivity and specificity. Abnormal edema and enhancement involving the distal head and metaphysis of the fifth metatarsal and adjacent proximal half of the small toe proximal phalanx compatible with osteomyelitis. Lateral ulceration along the fifth MTP joint probably  communicates with the joint on image 33/6. Extensive subcutaneous edema along the plantar foot at the level of the midfoot and forefoot, although I do not see a drainable abscess this may well be spontaneously draining to the plantar foot given the overlying skin irregularity. There is edema and likely some enhancement along the plantar foot musculature. Thickened proximal plantar fascia. Dorsal subcutaneous edema in the foot. IMPRESSION: 1. Ulceration lateral to the fifth MTP joint appears to extend all the way down into the joint. There is osteomyelitis distally in the fifth metatarsal and also in the proximal phalanx of the fifth toe. No other osteomyelitis in the foot is identified. 2. Suspected ulcerations along the plantar foot with confluent phlegmon tracking in the plantar foot, primarily within the subcutaneous tissues but  also along the deep margin of the medial band of the plantar fascia. I suspected any early abscess in this region is spontaneously draining. 3. Cellulitis along the dorsum of the foot. Electronically Signed   By: Gaylyn Rong M.D.   On: 11/02/2015 07:31   Dg Ang/ext/uni/or Left  Result Date: 11/02/2015 CLINICAL DATA:  65 year old female undergoing distal bypass EXAM: LEFT ANG/EXT/UNI/ OR COMPARISON:  Prior intraoperative angiographic images 8 09/17/2015 FINDINGS: Single cross-table lateral intraoperative image demonstrates what appears to be a patent saphenous vein bypass graft attaching to the distal popliteal artery below the knee joint. The popliteal artery is diffusely diseased. Primary runoff to the ankle is via the peroneal artery. The anterior tibial artery is occluded proximally but reconstitutes distally. The posterior tibial artery is occluded. IMPRESSION: Intraoperative arteriogram as above. Electronically Signed   By: Malachy Moan M.D.   On: 11/02/2015 14:26   - pertinent xrays, CT, MRI studies were reviewed and independently interpreted  Positive ROS: All  other systems have been reviewed and were otherwise negative with the exception of those mentioned in the HPI and as above.  Physical Exam: General: Alert, no acute distress Psychiatric: Patient is competent for consent with normal mood and affect Lymphatic: No axillary or cervical lymphadenopathy Cardiovascular: No pedal edema Respiratory: No cyanosis, no use of accessory musculature GI: No organomegaly, abdomen is soft and non-tender  Skin: Examination patient has an abscess ulcer on the plantar aspect the left foot. There is no cellulitis dorsally. MRI scan is reviewed which shows increased signal uptake in the fifth metatarsal head as well as the base of the fifth toe consistent with possible osteomyelitis she also has the large abscess identified on the plantar aspect of her foot visualized on the MRI scan. She has a dopplerable dorsalis pedis pulses status post revascularization.   Neurologic: Patient does not have protective sensation bilateral lower extremities.   MUSCULOSKELETAL:  On examination patient has a purulent abscess in the plantar aspect of her foot this does not extend below the plantar fascia. This does not track proximally.  Assessment: Assessment: Abscess left foot possible osteomyelitis little toe MTP joint status post revascularization.  Plan: Plan: Will have wound care initiated. Once patient has stabilized from a medical standpoint we'll plan for surgical debridement of the ulcerative area. Most likely place a installation wound VAC postoperatively. I will reevaluate the patient on Monday.  Thank you for the consult and the opportunity to see Ms. Kathleen Argue, MD [facility] [phone] 1:42 PM

## 2015-11-03 NOTE — Care Management Important Message (Signed)
Important Message  Patient Details  Name: [name] [name] MRN: [id number] Date of Birth: 09-24-1950   Medicare Important Message Given:  Yes    Zavior Thomason Abena 11/03/2015, 1:14 PM

## 2015-11-03 NOTE — Progress Notes (Signed)
CRITICAL VALUE ALERT  Critical value received:  Troponin 0.14  Date of notification:  11/03/15  Time of notification:  1730  Critical value read back:Yes.    Nurse who received alert:  Tory Emerald, RN  MD notified (1st page):  Dr Delton Coombes; on floor  Time of first page:  1730  MD notified (2nd page):  Time of second page:  Responding MD:  Dr Delton Coombes  Time MD responded:  1730; on floor

## 2015-11-04 DIAGNOSIS — E1151 Type 2 diabetes mellitus with diabetic peripheral angiopathy without gangrene: Secondary | ICD-10-CM

## 2015-11-04 DIAGNOSIS — M868X7 Other osteomyelitis, ankle and foot: Secondary | ICD-10-CM

## 2015-11-04 DIAGNOSIS — E1169 Type 2 diabetes mellitus with other specified complication: Principal | ICD-10-CM

## 2015-11-04 DIAGNOSIS — L97529 Non-pressure chronic ulcer of other part of left foot with unspecified severity: Secondary | ICD-10-CM

## 2015-11-04 DIAGNOSIS — Z9889 Other specified postprocedural states: Secondary | ICD-10-CM

## 2015-11-04 DIAGNOSIS — E11621 Type 2 diabetes mellitus with foot ulcer: Secondary | ICD-10-CM

## 2015-11-04 DIAGNOSIS — Z9911 Dependence on respirator [ventilator] status: Secondary | ICD-10-CM

## 2015-11-04 DIAGNOSIS — B9689 Other specified bacterial agents as the cause of diseases classified elsewhere: Secondary | ICD-10-CM

## 2015-11-04 LAB — CBC
HCT: 26.1 % — ABNORMAL LOW (ref 36.0–46.0)
HCT: 23.4 % — ABNORMAL LOW (ref 36.0–46.0)
HEMATOCRIT: 24 % — AB (ref 36.0–46.0)
HEMOGLOBIN: 8.1 g/dL — AB (ref 12.0–15.0)
Hemoglobin: 8.8 g/dL — ABNORMAL LOW (ref 12.0–15.0)
Hemoglobin: 7.8 g/dL — ABNORMAL LOW (ref 12.0–15.0)
MCH: 29.1 pg (ref 26.0–34.0)
MCH: 29 pg (ref 26.0–34.0)
MCH: 29.1 pg (ref 26.0–34.0)
MCHC: 33.7 g/dL (ref 30.0–36.0)
MCHC: 33.3 g/dL (ref 30.0–36.0)
MCHC: 33.8 g/dL (ref 30.0–36.0)
MCV: 86.4 fL (ref 78.0–100.0)
MCV: 87 fL (ref 78.0–100.0)
MCV: 86.3 fL (ref 78.0–100.0)
PLATELETS: 264 10*3/uL (ref 150–400)
PLATELETS: 240 10*3/uL (ref 150–400)
Platelets: 274 10*3/uL (ref 150–400)
RBC: 3.02 MIL/uL — ABNORMAL LOW (ref 3.87–5.11)
RBC: 2.69 MIL/uL — AB (ref 3.87–5.11)
RBC: 2.78 MIL/uL — ABNORMAL LOW (ref 3.87–5.11)
RDW: 14.4 % (ref 11.5–15.5)
RDW: 14.6 % (ref 11.5–15.5)
RDW: 14.8 % (ref 11.5–15.5)
WBC: 13.3 10*3/uL — ABNORMAL HIGH (ref 4.0–10.5)
WBC: 12.8 10*3/uL — AB (ref 4.0–10.5)
WBC: 12.9 10*3/uL — AB (ref 4.0–10.5)

## 2015-11-04 LAB — GLUCOSE, CAPILLARY
GLUCOSE-CAPILLARY: 122 mg/dL — AB (ref 65–99)
GLUCOSE-CAPILLARY: 155 mg/dL — AB (ref 65–99)
GLUCOSE-CAPILLARY: 93 mg/dL (ref 65–99)
Glucose-Capillary: 136 mg/dL — ABNORMAL HIGH (ref 65–99)
Glucose-Capillary: 80 mg/dL (ref 65–99)
Glucose-Capillary: 88 mg/dL (ref 65–99)

## 2015-11-04 LAB — TYPE AND SCREEN
ABO/RH(D): A POS
ANTIBODY SCREEN: NEGATIVE
UNIT DIVISION: 0
Unit division: 0

## 2015-11-04 LAB — URINE CULTURE: Culture: NO GROWTH

## 2015-11-04 LAB — HEPATITIS PANEL, ACUTE
HEP A IGM: NEGATIVE
HEP B S AG: NEGATIVE
Hep B C IgM: NEGATIVE

## 2015-11-04 LAB — PROTIME-INR
INR: 1.21
PROTHROMBIN TIME: 15.3 s — AB (ref 11.4–15.2)

## 2015-11-04 LAB — LACTIC ACID, PLASMA: Lactic Acid, Venous: 1.6 mmol/L (ref 0.5–1.9)

## 2015-11-04 LAB — CORTISOL-AM, BLOOD: Cortisol - AM: 15.8 ug/dL (ref 6.7–22.6)

## 2015-11-04 LAB — TROPONIN I
TROPONIN I: 3.83 ng/mL — AB (ref <0.03)
TROPONIN I: 2.32 ng/mL — AB (ref <0.03)

## 2015-11-04 LAB — APTT: aPTT: 37 seconds — ABNORMAL HIGH (ref 24–36)

## 2015-11-04 MED ORDER — FUROSEMIDE 10 MG/ML IJ SOLN
40.0000 mg | Freq: Once | INTRAMUSCULAR | Status: AC
  Administered 2015-11-04: 40 mg via INTRAVENOUS

## 2015-11-04 MED ORDER — ORAL CARE MOUTH RINSE
15.0000 mL | Freq: Two times a day (BID) | OROMUCOSAL | Status: DC
  Administered 2015-11-04: 15 mL via OROMUCOSAL

## 2015-11-04 MED ORDER — FENTANYL CITRATE (PF) 100 MCG/2ML IJ SOLN
12.5000 ug | INTRAMUSCULAR | Status: DC | PRN
  Administered 2015-11-05 – 2015-11-07 (×12): 25 ug via INTRAVENOUS
  Filled 2015-11-04 (×13): qty 2

## 2015-11-04 MED ORDER — PANTOPRAZOLE SODIUM 40 MG PO PACK
40.0000 mg | PACK | Freq: Every day | ORAL | Status: DC
  Administered 2015-11-04 – 2015-11-05 (×2): 40 mg
  Filled 2015-11-04 (×3): qty 20

## 2015-11-04 MED ORDER — FUROSEMIDE 10 MG/ML IJ SOLN
INTRAMUSCULAR | Status: AC
  Filled 2015-11-04: qty 4

## 2015-11-04 NOTE — Progress Notes (Signed)
eLink Physician-Brief Progress Note Patient Name: [name] [name] DOB: September 14, 1950 MRN: [id number]   Date of Service  11/04/2015  HPI/Events of Note  Hypoxemia - sat decreased into the mid 80's on 2.0 L/min [place] O2. No improvement with incentive spirometry and cough and deep breathing. Now on 100% NRBM. BP = 120/58 and last Creatinine + 0.69.  eICU Interventions  Will order: 1. Lasix 40 mg IV now.      Intervention Category Major Interventions: Hypoxemia - evaluation and management  Nalla Purdy Eugene 11/04/2015, 8:47 PM

## 2015-11-04 NOTE — Progress Notes (Signed)
STROKE TEAM PROGRESS NOTE   HISTORY OF PRESENT ILLNESS (per record) [name] [name] is an 65 y.o. female admitted from 8/31-09/29/15 for PAD and PVD with claudication; she had stenting of the distal left common femoral artery during that hospitalization. She had previously had a staged left common femoral endarterectomy with profundoplasty on 09/05/15. Patient underwent a left femoral to below-knee popliteal artery bypass with a non-reversed translocation left great saphenous vein along with an intraoperative angiogram on 11/02/2015. Postoperatively she was noted not wake up or respond in addition she was noted not to be moving her left side very well. Due to this neurology was consult at this morning for possibility of intraoperative stroke.    Addendum: Code stroke was called just after consult. Nurse states she was normal at 12:05. Per patients daughter she has not been normal since her surgery. Patient was brought to CT for emergent CTA and Perfusion.   Date last known well: Unable to determine Time last known well: Unable to determine tPA Given: No: Out of window, Recent surgery   SUBJECTIVE (INTERVAL HISTORY) Intubated but alert and following commands. No family members present. Doing much better overall.   OBJECTIVE Temp:  [97.6 F (36.4 C)-99.4 F (37.4 C)] 98.4 F (36.9 C) (10/07 0317) Pulse Rate:  [78-142] 91 (10/07 0700) Cardiac Rhythm: Normal sinus rhythm (10/07 0400) Resp:  [15-41] 18 (10/07 0700) BP: (43-168)/(27-80) 132/53 (10/07 0700) SpO2:  [85 %-100 %] 100 % (10/07 0700) FiO2 (%):  [40 %-100 %] 40 % (10/07 0419) Weight:  [73.3 kg (161 lb 9.6 oz)-78.4 kg (172 lb 13.5 oz)] 78.4 kg (172 lb 13.5 oz) (10/07 0600)  CBC:  Recent Labs Lab 10/30/15 1930  11/03/15 2122 11/04/15 0347  WBC 13.3*  < > 18.0* 13.3*  NEUTROABS 10.0*  --   --   --   HGB 10.5*  < > 9.2* 8.8*  HCT 31.2*  < > 27.9* 26.1*  MCV 85.0  < > 86.9 86.4  PLT 445*  < > 369 264  < > = values in this interval  not displayed.  Basic Metabolic Panel:  Recent Labs Lab 11/02/15 2202 11/03/15 0416 11/03/15 1612  NA  --  139 137  K  --  4.2 4.3  CL  --  108 109  CO2  --  25 20*  GLUCOSE  --  81 114*  BUN  --  12 14  CREATININE  --  0.62 0.64  CALCIUM  --  8.1* 7.8*  MG 1.2*  --   --     Lipid Panel:    Component Value Date/Time   CHOL 106 10/13/2013 0913   TRIG 160 (H) 10/13/2013 0913   HDL 33 (L) 10/13/2013 0913   CHOLHDL 3.2 10/13/2013 0913   VLDL 32 10/13/2013 0913   LDLCALC 41 10/13/2013 0913   HgbA1c:  Lab Results  Component Value Date   HGBA1C 7.6 (H) 11/02/2015   Urine Drug Screen: No results found for: LABOPIA, COCAINSCRNUR, LABBENZ, AMPHETMU, THCU, LABBARB    IMAGING  Ct Angio Head and Neck W Or Wo Contrast  10/6/20171 1. 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  2. Prolonged transit time throughout the right MCA territory due to #1 with compensated cerebral blood flow parameters. No evidence of acute infarct.  3. Chronically decreased number of distal right MCA branch vessels. No evidence of acute major branch occlusion.  4. Severe right V4 vertebral artery stenosis.  5. 60-65% stenoses of  the brachiocephalic and subclavian arteries.  6. Aortic atherosclerosis.  7. 8 mm left submandibular duct calculus.  8. Interlobular septal thickening and ground-glass opacities in lung apices, possibly reflecting edema. Correlate with chest radiography.     Ct Cerebral Perfusion W Contrast 11/03/2015 1. 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  2. Prolonged transit time throughout the right MCA territory due to #1 with compensated cerebral blood flow parameters.   No evidence of acute infarct.  3. Chronically decreased number of distal right MCA branch vessels. No evidence of acute major branch occlusion.  4. Severe right V4 vertebral artery stenosis.  5. 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  6. Aortic atherosclerosis.  7. 8 mm left submandibular duct calculus.  8. Interlobular septal thickening and ground-glass opacities in lung apices, possibly reflecting edema. Correlate with chest radiography.     Dg Chest Port 1 View 11/03/2015 1. Right internal jugular central venous catheter terminates in the upper third of the superior vena cava. No pneumothorax.  2. Top-normal heart size. Prominence of the central interstitial markings, favor vascular crowding due to low lung volumes .  3. Aortic atherosclerosis.    Dg Chest Port 1v Same Day 11/03/2015 Status post intubation. Slight retraction of the endotracheal tube may be considered. Interstitial pulmonary edema.    Dg Ang/ext/uni/or Left 11/02/2015 Intraoperative arteriogram as above.     Dg Abd Portable 1v 11/03/2015 Nonobstructive bowel gas pattern. Enteric catheter overlies the expected location of gastric body.     Ct Head Code Stroke Wo Contrast 11/03/2015   1. No evidence of acute intracranial abnormality.  2. ASPECTS is 10.  3. Mild chronic small vessel ischemic disease. Chronic left cerebellar infarct.    PHYSICAL EXAM HEENT-  Normocephalic, no lesions, without obvious abnormality.  Normal external eye and conjunctiva.  Normal TM's bilaterally.  Normal auditory canals and external ears. Normal external nose, mucus membranes and septum.  Normal pharynx. Cardiovascular- S1, S2 normal, pulses palpable throughout   Lungs- chest clear, no wheezing, rales, normal symmetric air entry Abdomen- normal findings: bowel sounds normal Extremities- no edema Lymph-no adenopathy palpable Musculoskeletal-no joint tenderness, deformity or swelling Skin-warm and dry, no hyperpigmentation, vitiligo, or suspicious lesions  Neurological Examination Mental Status: Intubated but awake and alert and follows commands consistently..  Cranial Nerves: II: no blink to threat, pupils equal,  round, reactive to light and accommodation III,IV, VI: ptosis not present, extra-ocular motions intact bilaterally V,VII: face has a left air leak and facial droop, facial light touch sensation normal bilaterally VIII: hearing normal bilaterally IX,X: uvula rises symmetrically XI: bilateral shoulder shrug XII: midline tongue extension Motor: Right :  Upper extremity   5/5                                      Left:     Upper extremity   4/5             Lower extremity   5/5                                                  Lower extremity   4/5 Tone and bulk:normal tone throughout; no atrophy noted Sensory: no response to pain in the left arm or  leg Deep Tendon Reflexes: 1+ and symmetric throughout Plantars: Right: downgoing                                Left: downgoing Cerebellar: normal finger-to-nose,  and normal heel-to-shin test Gait: not tested    ASSESSMENT/PLAN Ms. ANNASTACIA WOLBECK is a 65 y.o. female with history of left femoral to below-knee popliteal artery bypass 11/02/2015, diabetes mellitus, peripheral vascular disease, diabetic neuropathy, coronary artery disease with previous MI, migraine headaches, hypertension, hyperlipidemia, presenting with left-sided weakness following surgery. She did not receive IV t-PA due to recent surgery.  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 CTA neck.  2D Echo - pending  LDL - will order  HgbA1c - 7.6  VTE prophylaxis - SCDs  Diet NPO time specified  aspirin 81 mg daily and clopidogrel 75 mg daily prior to admission, now on No antithrombotic  Ongoing aggressive stroke risk factor management  Therapy recommendations: pending  Disposition: Pending  Hypertension  Stable  Permissive hypertension (OK if < 220/120) but gradually  normalize in 5-7 days  Long-term BP goal normotensive  Hyperlipidemia  Home meds:  Lipitor 80 mg daily resumed in hospital  LDL pending, goal < 70  Continue statin at discharge  Diabetes  HgbA1c 7.6, goal < 7.0  Uncontrolled  Other Stroke Risk Factors  Advanced age  Former cigarette smoker - quit smoking 6 years ago.  Obesity, Body mass index is 29.67 kg/m., recommend weight loss, diet and exercise as appropriate   Family hx stroke (Father)  Coronary artery disease  Migraines    Other Active Problems  Pulmonary edema by chest x-ray  Postop anemia  Leukocytosis - osteomyelitis of the left foot - infectious disease following - Vancomycin / Maxipime  NPO   PLAN  Resume antiplatelet therapy when okay from surgical standpoint.   Hospital day # 5  Delton See PA-C [facility] Pager [phone] 11/04/2015, 2:40 PM I have personally examined this patient, reviewed notes, independently viewed imaging studies, participated in medical decision making and plan of care.ROS completed by me personally and pertinent positives fully documented  I have made any additions or clarifications directly to the above note. Agree with note above. Patient has clearly improved. Recommend extubation today. I suspect patient had a right brain TIA with known chronic right ICA occlusion likely due to failure of collaterals circulation. Avoid hypotension and maintain adequate hydration status. Discussion a bedside with family members and answered questions. Start aspirin if okay from a surgical standpoint. This patient is critically ill and at significant risk of neurological worsening, death and care requires constant monitoring of vital signs, hemodynamics,respiratory and cardiac monitoring, extensive review of multiple databases, frequent neurological assessment, discussion with family, other specialists and medical decision making of high complexity.I have made any  additions or clarifications directly to the above note.This critical care time does not reflect procedure time, or teaching time or supervisory time of PA/NP/Med Resident etc but could involve care discussion time.  I spent 30 minutes of neurocritical care time  in the care of  this patient.     Delia Heady, MD Medical Director [facility] Pager: [phone] 11/04/2015 3:02 PM   To contact Stroke Continuity provider,  please refer to [email or web address] After hours, contact General Neurology

## 2015-11-04 NOTE — Progress Notes (Signed)
INFECTIOUS DISEASE PROGRESS NOTE  ID: [name] [name] is a 65 y.o. female with  Principal Problem:   Osteomyelitis of left foot ([place]) Active Problems:   Diabetes mellitus type II, uncontrolled ([place])   Hyperlipidemia with target LDL less than 70   PVD (peripheral vascular disease) with claudication (HCC)   Essential hypertension   Malnutrition of moderate degree   Diabetic foot ulcer (HCC)   S/P femoral-popliteal bypass surgery   Septic shock (HCC)   Encephalopathy acute   Acute blood loss anemia   Acute respiratory failure with hypoxia (HCC)   CVA (cerebral vascular accident) ([place])  Subjective: Awake and alert, would like tube out.   Abtx:  Anti-infectives    Start     Dose/Rate Route Frequency Ordered Stop   11/03/15 2000  ceFEPIme (MAXIPIME) 2 g in dextrose 5 % 50 mL IVPB     2 g 100 mL/hr over 30 Minutes Intravenous Every 12 hours 11/03/15 1913     11/03/15 1900  vancomycin (VANCOCIN) IVPB 750 mg/150 ml premix     750 mg 150 mL/hr over 60 Minutes Intravenous Every 12 hours 11/03/15 1743     11/03/15 1900  aztreonam (AZACTAM) 1 g in dextrose 5 % 50 mL IVPB  Status:  Discontinued     1 g 100 mL/hr over 30 Minutes Intravenous Every 8 hours 11/03/15 1743 11/03/15 1909   11/02/15 0600  vancomycin (VANCOCIN) IVPB 1000 mg/200 mL premix  Status:  Discontinued     1,000 mg 200 mL/hr over 60 Minutes Intravenous On call to O.R. 11/01/15 0854 11/01/15 0910   11/01/15 1300  cefTRIAXone (ROCEPHIN) 1 g in dextrose 5 % 50 mL IVPB     1 g 100 mL/hr over 30 Minutes Intravenous  Once 11/01/15 1215 11/01/15 1407   10/31/15 0600  vancomycin (VANCOCIN) IVPB 750 mg/150 ml premix  Status:  Discontinued     750 mg 150 mL/hr over 60 Minutes Intravenous Every 12 hours 10/31/15 0156 11/02/15 1828   10/31/15 0600  aztreonam (AZACTAM) 1 g in dextrose 5 % 50 mL IVPB  Status:  Discontinued     1 g 100 mL/hr over 30 Minutes Intravenous Every 8 hours 10/31/15 0210 11/01/15 1215   10/31/15 0045   aztreonam (AZACTAM) 2 g in dextrose 5 % 50 mL IVPB     2 g 100 mL/hr over 30 Minutes Intravenous  Once 10/31/15 0033 10/31/15 0131   10/30/15 2030  vancomycin (VANCOCIN) 1,500 mg in sodium chloride 0.9 % 500 mL IVPB     1,500 mg 250 mL/hr over 120 Minutes Intravenous  Once 10/30/15 2020 10/30/15 2335   10/30/15 2030  levofloxacin (LEVAQUIN) IVPB 750 mg     750 mg 100 mL/hr over 90 Minutes Intravenous  Once 10/30/15 2020 10/30/15 2231      Medications:  Scheduled: . atorvastatin  80 mg Oral Daily  . ceFEPime (MAXIPIME) IV  2 g Intravenous Q12H  . chlorhexidine gluconate (MEDLINE KIT)  15 mL Mouth Rinse BID  . feeding supplement (PRO-STAT SUGAR FREE 64)  30 mL Oral BID  . gabapentin  300 mg Oral TID  . insulin aspart  0-15 Units Subcutaneous Q4H  . insulin glargine  35 Units Subcutaneous Q2200  . mouth rinse  15 mL Mouth Rinse QID  . pantoprazole sodium  40 mg Per Tube Daily  . polyethylene glycol  17 g Oral Daily  . senna-docusate  1 tablet Oral QHS  . vancomycin  750 mg Intravenous  Q12H    Objective: Vital signs in last 24 hours: Temp:  [97.6 F (36.4 C)-99.4 F (37.4 C)] 98.6 F (37 C) (10/07 0905) Pulse Rate:  [78-142] 90 (10/07 1000) Resp:  [15-41] 19 (10/07 1000) BP: (43-168)/(27-80) 151/68 (10/07 1000) SpO2:  [85 %-100 %] 99 % (10/07 1000) FiO2 (%):  [40 %-100 %] 40 % (10/07 1000) Weight:  [73.3 kg (161 lb 9.6 oz)-78.4 kg (172 lb 13.5 oz)] 78.4 kg (172 lb 13.5 oz) (10/07 0600)   General appearance: alert, cooperative and no distress Resp: clear to auscultation bilaterally Cardio: regular rate and rhythm GI: normal findings: bowel sounds normal and soft, non-tender Extremities: edema L groin dressed. L foot dressed.   Lab Results  Recent Labs  11/03/15 0416 11/03/15 1612 11/03/15 2122 11/04/15 0347  WBC  --  17.4* 18.0* 13.3*  HGB  --  5.8* 9.2* 8.8*  HCT  --  18.0* 27.9* 26.1*  NA 139 137  --   --   K 4.2 4.3  --   --   CL 108 109  --   --   CO2 25  20*  --   --   BUN 12 14  --   --   CREATININE 0.62 0.64  --   --    Liver Panel  Recent Labs  11/03/15 1612  PROT 4.8*  ALBUMIN 1.6*  AST 31  ALT 13*  ALKPHOS 85  BILITOT 0.7  BILIDIR 0.1  IBILI 0.6   Sedimentation Rate  Recent Labs  11/02/15 0330  ESRSEDRATE 135*   C-Reactive Protein  Recent Labs  11/02/15 0330  CRP 3.7*    Microbiology: Recent Results (from the past 240 hour(s))  Surgical pcr screen     Status: None   Collection Time: 10/31/15  2:02 AM  Result Value Ref Range Status   MRSA, PCR NEGATIVE NEGATIVE Final   Staphylococcus aureus NEGATIVE NEGATIVE Final    Comment:        The Xpert SA Assay (FDA approved for NASAL specimens in patients over 20 years of age), is one component of a comprehensive surveillance program.  Test performance has been validated by [facility] for patients greater than or equal to 56 year old. It is not intended to diagnose infection nor to guide or monitor treatment.     Studies/Results: Ct Angio Head W Or Wo Contrast  Result Date: 11/03/2015 CLINICAL DATA:  Left-sided weakness and aphasia. EXAM: CT ANGIOGRAPHY HEAD AND NECK CT CEREBRAL PERFUSION WITH CONTRAST TECHNIQUE: Multidetector CT imaging of the head and neck was performed using the standard protocol during bolus administration of intravenous contrast. Multiplanar CT image reconstructions and MIPs were obtained to evaluate the vascular anatomy. Carotid stenosis measurements (when applicable) are obtained utilizing NASCET criteria, using the distal internal carotid diameter as the denominator. CT cerebral perfusion imaging was also performed during the dynamic injection of intravenous contrast. Post processing was performed. CONTRAST:  90 mL Isovue 370 COMPARISON:  Head MRI/ MRA 09/20/2013 FINDINGS: CTA NECK FINDINGS Aortic arch: Three vessel aortic arch with extensive atherosclerotic calcified plaque. There is approximately 60% stenosis of the proximal  brachiocephalic artery and proximal right subclavian artery. Atherosclerotic plaque in a focal when or intimal flap in the proximal left subclavian artery contribute to 65% stenosis. Right carotid system: Common carotid artery is patent without stenosis. Internal carotid artery is occluded at its origin without reconstitution in the neck. There is mild narrowing of the ECA origin. Left carotid system: Moderate atherosclerotic  plaque at the carotid bifurcation without significant common or internal carotid artery stenosis. Mild proximal ECA stenosis. Vertebral arteries: Vertebral arteries are patent and codominant. There is mild right V2 narrowing at the C2 and C5 levels due to calcified plaque. There is mild left V2 narrowing due to degenerative vertebral spurring at C5-6 and C6-7. Skeleton: Moderate to advanced disc degeneration from C4-5 to C6-7. Other neck: 8 mm calculus in the distal left submandibular duct without significant ductal dilatation or inflammatory change. Small left thyroid nodule. Upper chest: Interlobular septal thickening in the lung apices with partially visualized ground-glass opacity posteriorly in the left upper lobe and superior segment segments of the left greater than right upper lobes. Review of the MIP images confirms the above findings CTA HEAD FINDINGS Anterior circulation: Chronically occluded right ICA with reconstitution of the terminus via a small but patent right A1 segment, unchanged. Right M1 segment is widely patent. There is at most mild narrowing of the proximal right M2 inferior division. Decreased number of distal right MCA branch vessels in the right parietal region is chronic. Intracranial left ICA is patent with extensive atherosclerosis though only resulting in at most mild stenosis. 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 Left A1 segment is widely  patent, as is the anterior communicating artery. A2 segments are patent. Left MCA is patent without evidence of major branch occlusion or significant proximal stenosis. Posterior circulation: Intracranial vertebral arteries are patent to the basilar with prominent atherosclerosis, greater on the right. There is severe proximal right V4 stenosis with additional moderate tandem stenoses. PICA origins appear patent, potentially with a high-grade stenosis on the right. AICA and SCA origins are patent. Basilar artery is patent without stenosis. Posterior communicating arteries are not identified. PCAs are patent with mild distal left P1 narrowing. No aneurysm. Venous sinuses: Patent. Anatomic variants: Small right A1. Review of the MIP images confirms the above findings CT CEREBRAL PERFUSION FINDINGS There is mildly prolonged transit time throughout most of the right MCA territory, with slightly greater delayed transit (6 seconds greater than the contralateral side) in the deep watershed territory/centrum semiovale. Cerebral blood flow is preserved. Cerebral blood volume is also normal to mildly increased. No diminished CBF/CBV suggestive of acute infarction is seen. IMPRESSION: 1. 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 2. Prolonged transit time throughout the right MCA territory due to #1 with compensated cerebral blood flow parameters. No evidence of acute infarct. 3. Chronically decreased number of distal right MCA branch vessels. No evidence of acute major branch occlusion. 4. Severe right V4 vertebral artery stenosis. 5. 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 6. Aortic atherosclerosis. 7. 8 mm left submandibular duct calculus. 8. Interlobular septal thickening and ground-glass opacities in lung apices, possibly reflecting edema. Correlate with chest radiography. These results were called by telephone at the time of interpretation on 11/03/2015  at 4:00 pm to Dr. Gifford Shave, who verbally acknowledged these results. Electronically Signed   By: Logan Bores M.D.   On: 11/03/2015 16:31   Ct Angio Neck W Or Wo Contrast  Result Date: 11/03/2015 CLINICAL DATA:  Left-sided weakness and aphasia. EXAM: CT ANGIOGRAPHY HEAD AND NECK CT CEREBRAL PERFUSION WITH CONTRAST TECHNIQUE: Multidetector CT imaging of the head and neck was performed using the standard protocol during bolus administration of intravenous contrast. Multiplanar CT image reconstructions and MIPs were obtained to evaluate the vascular anatomy.  Carotid stenosis measurements (when applicable) are obtained utilizing NASCET criteria, using the distal internal carotid diameter as the denominator. CT cerebral perfusion imaging was also performed during the dynamic injection of intravenous contrast. Post processing was performed. CONTRAST:  90 mL Isovue 370 COMPARISON:  Head MRI/ MRA 09/20/2013 FINDINGS: CTA NECK FINDINGS Aortic arch: Three vessel aortic arch with extensive atherosclerotic calcified plaque. There is approximately 60% stenosis of the proximal brachiocephalic artery and proximal right subclavian artery. Atherosclerotic plaque in a focal when or intimal flap in the proximal left subclavian artery contribute to 65% stenosis. Right carotid system: Common carotid artery is patent without stenosis. Internal carotid artery is occluded at its origin without reconstitution in the neck. There is mild narrowing of the ECA origin. Left carotid system: Moderate atherosclerotic plaque at the carotid bifurcation without significant common or internal carotid artery stenosis. Mild proximal ECA stenosis. Vertebral arteries: Vertebral arteries are patent and codominant. There is mild right V2 narrowing at the C2 and C5 levels due to calcified plaque. There is mild left V2 narrowing due to degenerative vertebral spurring at C5-6 and C6-7. Skeleton: Moderate to advanced disc degeneration from C4-5 to C6-7. Other  neck: 8 mm calculus in the distal left submandibular duct without significant ductal dilatation or inflammatory change. Small left thyroid nodule. Upper chest: Interlobular septal thickening in the lung apices with partially visualized ground-glass opacity posteriorly in the left upper lobe and superior segment segments of the left greater than right upper lobes. Review of the MIP images confirms the above findings CTA HEAD FINDINGS Anterior circulation: Chronically occluded right ICA with reconstitution of the terminus via a small but patent right A1 segment, unchanged. Right M1 segment is widely patent. There is at most mild narrowing of the proximal right M2 inferior division. Decreased number of distal right MCA branch vessels in the right parietal region is chronic. Intracranial left ICA is patent with extensive atherosclerosis though only resulting in at most mild stenosis. 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 Left A1 segment is widely patent, as is the anterior communicating artery. A2 segments are patent. Left MCA is patent without evidence of major branch occlusion or significant proximal stenosis. Posterior circulation: Intracranial vertebral arteries are patent to the basilar with prominent atherosclerosis, greater on the right. There is severe proximal right V4 stenosis with additional moderate tandem stenoses. PICA origins appear patent, potentially with a high-grade stenosis on the right. AICA and SCA origins are patent. Basilar artery is patent without stenosis. Posterior communicating arteries are not identified. PCAs are patent with mild distal left P1 narrowing. No aneurysm. Venous sinuses: Patent. Anatomic variants: Small right A1. Review of the MIP images confirms the above findings CT CEREBRAL PERFUSION FINDINGS There is mildly prolonged transit time throughout most of the  right MCA territory, with slightly greater delayed transit (6 seconds greater than the contralateral side) in the deep watershed territory/centrum semiovale. Cerebral blood flow is preserved. Cerebral blood volume is also normal to mildly increased. No diminished CBF/CBV suggestive of acute infarction is seen. IMPRESSION: 1. 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 2. Prolonged transit time throughout the right MCA territory due to #1 with compensated cerebral blood flow parameters. No evidence of acute infarct. 3. Chronically decreased number of distal right MCA branch vessels. No evidence of acute major branch occlusion. 4. Severe right V4 vertebral artery stenosis. 5. 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 6. Aortic atherosclerosis. 7. 8 mm left submandibular duct calculus. 8. Interlobular septal thickening and ground-glass opacities in lung apices, possibly reflecting edema. Correlate with chest radiography. These results were called by telephone at the time of interpretation on 11/03/2015 at 4:00 pm to Dr. Gifford Shave, who verbally acknowledged these results. Electronically Signed   By: Logan Bores M.D.   On: 11/03/2015 16:31   Ct Cerebral Perfusion W Contrast  Result Date: 11/03/2015 CLINICAL DATA:  Left-sided weakness and aphasia. EXAM: CT ANGIOGRAPHY HEAD AND NECK CT CEREBRAL PERFUSION WITH CONTRAST TECHNIQUE: Multidetector CT imaging of the head and neck was performed using the standard protocol during bolus administration of intravenous contrast. Multiplanar CT image reconstructions and MIPs were obtained to evaluate the vascular anatomy. Carotid stenosis measurements (when applicable) are obtained utilizing NASCET criteria, using the distal internal carotid diameter as the denominator. CT cerebral perfusion imaging was also performed during the dynamic injection of intravenous contrast. Post processing was performed. CONTRAST:  90 mL  Isovue 370 COMPARISON:  Head MRI/ MRA 09/20/2013 FINDINGS: CTA NECK FINDINGS Aortic arch: Three vessel aortic arch with extensive atherosclerotic calcified plaque. There is approximately 60% stenosis of the proximal brachiocephalic artery and proximal right subclavian artery. Atherosclerotic plaque in a focal when or intimal flap in the proximal left subclavian artery contribute to 65% stenosis. Right carotid system: Common carotid artery is patent without stenosis. Internal carotid artery is occluded at its origin without reconstitution in the neck. There is mild narrowing of the ECA origin. Left carotid system: Moderate atherosclerotic plaque at the carotid bifurcation without significant common or internal carotid artery stenosis. Mild proximal ECA stenosis. Vertebral arteries: Vertebral arteries are patent and codominant. There is mild right V2 narrowing at the C2 and C5 levels due to calcified plaque. There is mild left V2 narrowing due to degenerative vertebral spurring at C5-6 and C6-7. Skeleton: Moderate to advanced disc degeneration from C4-5 to C6-7. Other neck: 8 mm calculus in the distal left submandibular duct without significant ductal dilatation or inflammatory change. Small left thyroid nodule. Upper chest: Interlobular septal thickening in the lung apices with partially visualized ground-glass opacity posteriorly in the left upper lobe and superior segment segments of the left greater than right upper lobes. Review of the MIP images confirms the above findings CTA HEAD FINDINGS Anterior circulation: Chronically occluded right ICA with reconstitution of the terminus via a small but patent right A1 segment, unchanged. Right M1 segment is widely patent. There is at most mild narrowing of the proximal right M2 inferior division. Decreased number of distal right MCA branch vessels in the right parietal region is chronic. Intracranial left ICA is patent with extensive atherosclerosis though only resulting  in at most mild stenosis. 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 Left A1 segment is widely patent, as is the anterior communicating artery. A2 segments are patent. Left MCA is patent without evidence of major branch occlusion or significant proximal stenosis. Posterior circulation: Intracranial vertebral arteries are patent to the basilar with prominent atherosclerosis, greater on the right. There is severe proximal right V4 stenosis with additional moderate tandem stenoses. PICA origins appear patent, potentially with a high-grade stenosis on the right. AICA and SCA origins are patent. Basilar artery is patent without stenosis. Posterior communicating arteries are not identified. PCAs are patent with mild distal left P1 narrowing. No aneurysm. Venous sinuses: Patent. Anatomic variants: Small right  A1. Review of the MIP images confirms the above findings CT CEREBRAL PERFUSION FINDINGS There is mildly prolonged transit time throughout most of the right MCA territory, with slightly greater delayed transit (6 seconds greater than the contralateral side) in the deep watershed territory/centrum semiovale. Cerebral blood flow is preserved. Cerebral blood volume is also normal to mildly increased. No diminished CBF/CBV suggestive of acute infarction is seen. IMPRESSION: 1. 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 2. Prolonged transit time throughout the right MCA territory due to #1 with compensated cerebral blood flow parameters. No evidence of acute infarct. 3. Chronically decreased number of distal right MCA branch vessels. No evidence of acute major branch occlusion. 4. Severe right V4 vertebral artery stenosis. 5. 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 6. Aortic atherosclerosis. 7. 8 mm left submandibular duct calculus. 8.  Interlobular septal thickening and ground-glass opacities in lung apices, possibly reflecting edema. Correlate with chest radiography. These results were called by telephone at the time of interpretation on 11/03/2015 at 4:00 pm to Dr. Gifford Shave, who verbally acknowledged these results. Electronically Signed   By: Logan Bores M.D.   On: 11/03/2015 16:31   Dg Chest Port 1 View  Result Date: 11/03/2015 CLINICAL DATA:  Right central venous line placement EXAM: PORTABLE CHEST 1 VIEW COMPARISON:  08/18/2015 chest radiograph. FINDINGS: Right internal jugular central venous catheter terminates in the upper third of the superior vena cava. Stable cardiomediastinal silhouette with normal heart size with aortic atherosclerosis. No pneumothorax. No pleural effusion. Mild diffuse prominence of the central interstitial markings. No acute consolidative airspace disease. IMPRESSION: 1. Right internal jugular central venous catheter terminates in the upper third of the superior vena cava. No pneumothorax. 2. Top-normal heart size. Prominence of the central interstitial markings, favor vascular crowding due to low lung volumes . 3. Aortic atherosclerosis. Electronically Signed   By: Ilona Sorrel M.D.   On: 11/03/2015 17:31   Dg Chest Port 1v Same Day  Result Date: 11/03/2015 CLINICAL DATA:  Post intubation. EXAM: PORTABLE CHEST 1 VIEW COMPARISON:  11/03/2015 FINDINGS: Patient is status post intubation. Endotracheal tube overlies the tracheal air column and terminates 15 mm above the carina. Retraction with approximately 1-2 cm may be considered. Enteric catheter has been placed, tip collimated off the image. Right IJ approach central venous catheter is stable. Cardiomediastinal silhouette is normal. Mediastinal contours appear intact. Calcific atherosclerotic disease of the aorta seen. There is no evidence of pneumothorax. Low lung volumes with interstitial pulmonary edema. Osseous structures are without acute abnormality. Soft  tissues are grossly normal. IMPRESSION: Status post intubation. Slight retraction of the endotracheal tube may be considered. Interstitial pulmonary edema. Electronically Signed   By: Fidela Salisbury M.D.   On: 11/03/2015 19:17   Dg Ang/ext/uni/or Left  Result Date: 11/02/2015 CLINICAL DATA:  65 year old female undergoing distal bypass EXAM: LEFT ANG/EXT/UNI/ OR COMPARISON:  Prior intraoperative angiographic images 8 09/17/2015 FINDINGS: Single cross-table lateral intraoperative image demonstrates what appears to be a patent saphenous vein bypass graft attaching to the distal popliteal artery below the knee joint. The popliteal artery is diffusely diseased. Primary runoff to the ankle is via the peroneal artery. The anterior tibial artery is occluded proximally but reconstitutes distally. The posterior tibial artery is occluded. IMPRESSION: Intraoperative arteriogram as above. Electronically Signed   By: Jacqulynn Cadet M.D.   On: 11/02/2015 14:26   Dg Abd Portable 1v  Result Date: 11/03/2015 CLINICAL DATA:  Orogastric tube placement. EXAM: PORTABLE ABDOMEN -  1 VIEW COMPARISON:  None. FINDINGS: The bowel gas pattern is normal. Enteric catheter is seen overlying the expected location of gastric body. Bilateral iliac vascular stents are seen. Soft tissue details are obscured by a motion artifact. IMPRESSION: Nonobstructive bowel gas pattern. Enteric catheter overlies the expected location of gastric body. Electronically Signed   By: Fidela Salisbury M.D.   On: 11/03/2015 19:20   Ct Head Code Stroke Wo Contrast  Addendum Date: 11/03/2015   ADDENDUM REPORT: 11/03/2015 15:12 ADDENDUM: These results were called by telephone at the time of interpretation on 11/03/2015 at 3:05 pm to Dr. Gifford Shave, who verbally acknowledged these results. Electronically Signed   By: Logan Bores M.D.   On: 11/03/2015 15:12   Result Date: 11/03/2015 CLINICAL DATA:  Code stroke. Slurred speech with left-sided deficits. EXAM:  CT HEAD WITHOUT CONTRAST TECHNIQUE: Contiguous axial images were obtained from the base of the skull through the vertex without intravenous contrast. COMPARISON:  Brain MRI 03/03/2013 and CT 03/02/2013 FINDINGS: Brain: Mild motion artifact. There is no evidence of acute cortical infarct, intracranial hemorrhage, mass, midline shift, or extra-axial fluid collection. Ventricles and sulci are normal in size for age. Patchy cerebral white matter hypodensities are stable to slightly progressive and nonspecific but compatible with mild chronic small vessel ischemic disease. A small chronic left cerebellar infarct is unchanged. Vascular: Calcified atherosclerosis at the skullbase. Skull: No fracture or focal osseous lesion. Sinuses/Orbits: Prior right cataract extraction.  Clear sinuses. Other: None. ASPECTS [place] Stroke Program Early CT Score) - Ganglionic level infarction (caudate, lentiform nuclei, internal capsule, insula, M1-M3 cortex): 7 - Supraganglionic infarction (M4-M6 cortex): 3 Total score (0-10 with 10 being normal): 10 IMPRESSION: 1. No evidence of acute intracranial abnormality. 2. ASPECTS is 10. 3. Mild chronic small vessel ischemic disease. Chronic left cerebellar infarct. Electronically Signed: By: Logan Bores M.D. On: 11/03/2015 14:48     Assessment/Plan: Diabetic Foot Ulcers, osteomyelitis Acute blood loss anemia PVD L fem-pop 10-5 Acute CVA VDRF  Total days of antibiotics: 5 (day 1 vanco/cefepime)         Repeat BCx ngtd H/h improved She appears to be doing well, hopefully extubated soon.   Bobby Rumpf Infectious Diseases (pager) [phone] [email or web address] 11/04/2015, 10:56 AM  LOS: 5 days

## 2015-11-04 NOTE — Procedures (Signed)
Extubation Procedure Note  Patient Details:   Name: [name] [name] DOB: April 01, 1950 MRN: [id number]   Airway Documentation:  Airway 7.5 mm (Active)  Secured at (cm) 24 cm 11/04/2015 11:45 AM  Measured From Lips 11/04/2015 11:45 AM  Secured Location Right 11/04/2015 11:45 AM  Secured By [employer] 11/04/2015 11:45 AM  Tube Holder Repositioned Yes 11/04/2015 11:45 AM  Cuff Pressure (cm H2O) 26 cm H2O 11/04/2015  4:19 AM  Site Condition Dry 11/04/2015 11:45 AM    Evaluation  O2 sats: stable throughout Complications: No apparent complications Patient did tolerate procedure well. Bilateral Breath Sounds: Clear   Yes   Patient was extubated to a 4L [place]. Cuff leak was heard. No stridor noted. RN at bedside with RT during extubation. Patient was able to tell us her name and location. RT will continue to monitor.  [name] [name] 11/04/2015, 2:27 PM

## 2015-11-04 NOTE — Progress Notes (Signed)
eLink Physician-Brief Progress Note Patient Name: [name] [name] DOB: 11-13-1950 MRN: [id number]   Date of Service  11/04/2015  HPI/Events of Note  Troponin I 1.77 and previously 0.16. Patient currently off vasopressor support. Status post endotracheal intubation. Hemoglobin 9.2 on last check. INR 1.2 due to & PTT 36. Currently off systemic anticoagulation.   eICU Interventions  1. Checking EKG 2. Holding on systemic anticoagulation given concern for bleed 3. Ordering transthoracic echocardiogram to evaluate for wall motion abnormality      Intervention Category Intermediate Interventions: Diagnostic test evaluation  Lawanda Cousins 11/04/2015, 12:20 AM

## 2015-11-04 NOTE — Progress Notes (Signed)
CRITICAL VALUE ALERT  Critical value received:  Troponin 1.77  Date of notification:  11/04/2015  Time of notification:  2345  Critical value read back:Yes  Nurse who received alert:  Tyra Gural  MD notified (1st page):  Elink  Time of first page:  2350  MD notified (2nd page):  Time of second page:  Responding MD:  Jamison Neighbor  Time MD responded:  2350

## 2015-11-04 NOTE — Progress Notes (Signed)
Called to room for pt desat. Sats 82-84% on 6L [place]. No improvement on 55% venti mask. Sats improved with 100% NRB and sats currently 99%. RT will continue to monitor.

## 2015-11-04 NOTE — Progress Notes (Signed)
Physical Therapy Wound Evaluation/Treatment Patient Details  Name: [name] [name] MRN: [id number] Date of Birth: 11-Apr-1950  Today's Date: 11/04/2015 Time: 3875-6433 Time Calculation (min): 26 min  Subjective  Subjective: On vent. Able to answer yes/no appropriately Patient and Family Stated Goals: None stated Date of Onset:  (Unknown) Prior Treatments: None noted. Now s/p revascularization LLE.  Pain Score:  4 (Faces) During pulsed-lavage and debridement  Wound Assessment  Wound / Incision (Open or Dehisced) 08/28/15 Diabetic ulcer Foot Left;Lateral (Active)  Dressing Type ABD 11/04/2015  9:52 AM  Dressing Changed Changed 11/04/2015  9:52 AM  Dressing Status Clean;Intact 11/04/2015  9:52 AM  Dressing Change Frequency Daily 11/04/2015  9:52 AM  Site / Wound Assessment Dry;Black 11/04/2015  9:52 AM  % Wound base Black 100% 11/04/2015  9:52 AM  Peri-wound Assessment Edema;Pink 11/04/2015  9:52 AM  Wound Length (cm) 2.5 cm 11/04/2015  9:52 AM  Wound Width (cm) 1.5 cm 11/04/2015  9:52 AM  Margins Attached edges (approximated) 11/04/2015  9:52 AM  Drainage Amount None 11/04/2015  9:52 AM  Treatment Hydrotherapy (Pulse lavage);Other (Comment) 11/04/2015  9:52 AM     Wound / Incision (Open or Dehisced) 10/30/15 Diabetic ulcer Foot Left;Mid (Active)  Dressing Type ABD 11/04/2015  9:52 AM  Dressing Changed Changed 11/04/2015  9:52 AM  Dressing Status Clean;Dry;Intact 11/04/2015  9:52 AM  Dressing Change Frequency Daily 11/04/2015  9:52 AM  Site / Wound Assessment Brown;Yellow 11/04/2015  9:52 AM  % Wound base Yellow 100% 11/04/2015  9:52 AM  Peri-wound Assessment Pink;Denuded;Erythema (blanchable) 11/04/2015  9:52 AM  Wound Length (cm) 2.5 cm 11/04/2015  9:52 AM  Wound Width (cm) 2.75 cm 11/04/2015  9:52 AM  Margins Unattached edges (unapproximated) 11/04/2015  9:52 AM  Closure None 11/04/2015  9:52 AM  Drainage Amount Moderate 11/04/2015  9:52 AM  Drainage Description Purulent 11/04/2015  9:52 AM  Treatment  Hydrotherapy (Pulse lavage);Debridement (Selective) 11/04/2015  9:52 AM     Wound / Incision (Open or Dehisced) 10/30/15 Diabetic ulcer Foot Left;Mid (Active)  Dressing Type ABD 11/04/2015  9:52 AM  Dressing Changed Changed 11/04/2015  9:52 AM  Dressing Status Clean;Dry;Intact 11/04/2015  9:52 AM  Dressing Change Frequency Daily 11/04/2015  9:52 AM  Site / Wound Assessment Yellow 11/04/2015  9:52 AM  % Wound base Yellow 100% 11/04/2015  9:52 AM  Peri-wound Assessment Denuded;Erythema (blanchable);Pink 11/04/2015  9:52 AM  Wound Length (cm) 1 cm 11/04/2015  9:52 AM  Wound Width (cm) 1 cm 11/04/2015  9:52 AM  Closure None 11/04/2015  9:52 AM  Drainage Amount Moderate 11/04/2015  9:52 AM  Drainage Description Purulent 11/04/2015  9:52 AM  Treatment Hydrotherapy (Pulse lavage);Debridement (Selective) 11/04/2015  9:52 AM      Hydrotherapy Pulsed lavage therapy - wound location: Left foot plantar surface and lateral surface Pulsed Lavage with Suction (psi): 4 psi Pulsed Lavage with Suction - Normal Saline Used: 1000 mL Pulsed Lavage Tip: Tip with splash shield Selective Debridement Selective Debridement - Location: Left foot plantar surface Selective Debridement - Tools Used: Forceps;Scissors Selective Debridement - Tissue Removed: Yellow eschar   Wound Assessment and Plan  Wound Therapy - Assess/Plan/Recommendations Wound Therapy - Clinical Statement: Patient with wounds on Lt foot with black/yellow eschar covering.  Patient with DM and PVD impacting healing.  Now s/p revascularization LLE.  Will benefit from hydrotherapy to cleanse wounds. Wound Therapy - Functional Problem List: Wounds impacting mobility and gait Factors Delaying/Impairing Wound Healing: Diabetes Mellitus;Vascular compromise Hydrotherapy Plan: Debridement;Dressing change;Patient/family  education;Pulsatile lavage with suction Wound Therapy - Frequency: 6X / week Wound Therapy - Current Recommendations: PT Wound Therapy - Follow Up  Recommendations: Home health RN Wound Plan: See above  Wound Therapy Goals- Improve the function of patient's integumentary system by progressing the wound(s) through the phases of wound healing (inflammation - proliferation - remodeling) by: Decrease Necrotic Tissue to: 50% Decrease Necrotic Tissue - Progress: Goal set today Increase Granulation Tissue to: 50% Increase Granulation Tissue - Progress: Goal set today Improve Drainage Characteristics: Min;Serous Improve Drainage Characteristics - Progress: Goal set today Patient/Family will be able to : Understand pressure relief for wound healing and dressing changes. Patient/Family Instruction Goal - Progress: Goal set today Goals/treatment plan/discharge plan were made with and agreed upon by patient/family: Yes Time For Goal Achievement: 2 weeks Wound Therapy - Potential for Goals: Good  Goals will be updated until maximal potential achieved or discharge criteria met.  Discharge criteria: when goals achieved, discharge from hospital, MD decision/surgical intervention, no progress towards goals, refusal/missing three consecutive treatments without notification or medical reason.  GP     Despina Pole 11/04/2015, 10:19 AM Carita Pian. Sanjuana Kava, [place] Pager [phone]

## 2015-11-04 NOTE — Progress Notes (Signed)
PULMONARY / CRITICAL CARE MEDICINE   Name: [name] [name] MRN: [id number] DOB: April 14, 1950    ADMISSION DATE:  10/30/2015 CONSULTATION DATE:  11/03/15  REFERRING MD:  Dr Edward Jolly, [facility] - Inpatient  CHIEF COMPLAINT:  Altered MS and shock  HISTORY OF PRESENT ILLNESS:   65 yo woman, hx PVD / PAD, L foot plantar abscess, s/p L fem-pop bypass on 11/02/15. Evaluated by Dr Lajoyce Corners 10/6 and recommended surgical debridement. She was awake and interacting normally as of 12:20pm, then noted to have slurred speech, L sided weakness, altered MS. She had received narcs around 10:00 so was given narcan. She responded with improved MS, wakefulness. A head CT ruled out acute bleed. Through the pm she has had progressive lethargy, garbled speech, poor airway management and now shock. She was started on dopamine and PCCM was urgently consulted to evaluate. Note neurology has been consulted for suspected acute post-op CVA. ABG showed adequate ventilation and oxygenation . Moves to ICU. Received 2 u PRBCs for hgb to 5.8. Intubated evening of 10/6 for concern for ability to protect airway.   PAST MEDICAL HISTORY :  She  has a past medical history of Arthritis; Complication of anesthesia; Coronary artery disease; Family history of adverse reaction to anesthesia; GERD (gastroesophageal reflux disease); Hip bursitis; Hyperlipidemia LDL goal < 70 (06/28/2013); Hypertension; Migraine; Myocardial infarction (2011); Neuromuscular disorder (HCC); PAD (peripheral artery disease) (HCC); Peripheral vascular disease (HCC); and Type II diabetes mellitus (HCC).  PAST SURGICAL HISTORY: She  has a past surgical history that includes PERIPHERAL VASCULAR ANGIOGRAM (01/26/2010); PERIPHERAL VASCULAR ANGIOGRAM (02/23/2010); PERIPHERAL VASCULAR ANGIOGRAM (06/17/2010); PERIPHERAL VASCULAR ANGIOGRAM (06/04/2011); CAROTID DUPLEX (03/19/2011); LEXISCAN MYOVIEW (10/25/2010); transthoracic echocardiogram (10/17/2009); atherectomy (N/A, 06/04/2011); PERIPHERAL VASCULAR ANGIOGRAM  (08/28/2015); Iliac artery stent (Left, 08/28/2015); Appendectomy; Ovary surgery (1983?); Cesarean section (1990); Coronary angioplasty; Cardiac catheterization (10/13/2009); Cardiac catheterization (N/A, 08/28/2015); Cardiac catheterization (N/A, 08/28/2015); Cardiac catheterization (Left, 08/28/2015); Cardiac catheterization (Left, 08/28/2015); Endarterectomy femoral (Left, 09/05/2015); Intraoperative arteriogram (Left, 09/05/2015); Abdominal hysterectomy; Cataract extraction w/ intraocular lens implant (Right); Cardiac catheterization (N/A, 09/28/2015); Cardiac catheterization (Left, 09/28/2015); Femoral-popliteal Bypass Graft (Left, 11/02/2015); Vein harvest (Left, 11/02/2015); and Intraoperative arteriogram (Left, 11/02/2015).  Allergies  Allergen Reactions  . Hydrochlorothiazide Other (See Comments)    Unknown  . Latex Rash  . Penicillins Swelling and Rash    Pt states she has tolerated Keflex in the past without problems. States she may have tolerated Augmentin in the past but it caused GI upset.    No current facility-administered medications on file prior to encounter.    Current Outpatient Prescriptions on File Prior to Encounter  Medication Sig  . amLODipine (NORVASC) 5 MG tablet TAKE 1 TABLET(5 MG) BY MOUTH DAILY  . aspirin EC 81 MG tablet Take 81 mg by mouth daily.  Marland Kitchen atorvastatin (LIPITOR) 80 MG tablet Take 1 tablet (80 mg total) by mouth daily.  . clopidogrel (PLAVIX) 75 MG tablet Take 1 tablet (75 mg total) by mouth daily. Holding for vascular surgery, restart as soon as possible after leg surgery (after 8/8)  . isosorbide mononitrate (IMDUR) 60 MG 24 hr tablet Take 90 mg by mouth daily.   Marland Kitchen losartan (COZAAR) 100 MG tablet TAKE 1 TABLET BY MOUTH EVERY DAY  . metFORMIN (GLUCOPHAGE) 1000 MG tablet Take 1,000 mg by mouth 2 (two) times daily with a meal.  . metoprolol succinate (TOPROL-XL) 50 MG 24 hr tablet Take 1 tablet (50 mg total) by mouth daily.  Marland Kitchen oxyCODONE-acetaminophen  (PERCOCET/ROXICET) 5-325 MG tablet Take 1-2 tablets by mouth  every 6 (six) hours as needed for moderate pain.  . pantoprazole (PROTONIX) 40 MG tablet Take 1 tablet (40 mg total) by mouth daily.  . Insulin Glargine (LANTUS) 100 UNIT/ML Solostar Pen Inject 35 Units into the skin daily at 10 pm.   . OVER THE COUNTER MEDICATION Apply 1 application topically daily. [place]    FAMILY HISTORY:  Her indicated that her mother is deceased. She indicated that her father is deceased.    SOCIAL HISTORY: She  reports that she quit smoking about 6 years ago. Her smoking use included Cigarettes. She has a 61.50 pack-year smoking history. She has never used smokeless tobacco. She reports that she does not drink alcohol or use drugs.  REVIEW OF SYSTEMS:   Unable to obtain due to intubation  SUBJECTIVE:  Alert, awake, sitting up in bed Indicates L foot hurts and so dose manipulation of drains Pressors off  VITAL SIGNS: BP (!) 151/68   Pulse 90   Temp 98.6 F (37 C) (Oral)   Resp 19   Ht 5\' 4"  (1.626 m)   Wt 78.4 kg (172 lb 13.5 oz)   SpO2 99%   BMI 29.67 kg/m   HEMODYNAMICS:    VENTILATOR SETTINGS: Vent Mode: PSV;CPAP FiO2 (%):  [40 %-100 %] 40 % Set Rate:  [14 bmp] 14 bmp Vt Set:  [540 mL] 540 mL PEEP:  [5 cmH20] 5 cmH20 Pressure Support:  [8 cmH20] 8 cmH20 Plateau Pressure:  [17 cmH20-19 cmH20] 17 cmH20  INTAKE / OUTPUT: I/O last 3 completed shifts: In: 3004.9 [P.O.:60; I.V.:1324.9; Blood:670; IV Piggyback:950] Out: 1520 [Urine:1500; Drains:20]  PHYSICAL EXAMINATION: General:  Ill appearing woman, sitting up in bed with ETT in place Neuro: Alert, gesticulating with hands, nodding responses to questions HEENT: MMM.  Cardiovascular: RRR, no m/r/g appreciated Lungs: Coarse bilaterally, improved from yesterday, no wheezing Abdomen: Soft, NT, + BS Musculoskeletal:  L LE staples CDI, L dorsal foot wound dressed. 2 L LE bulb drains with minimal drainage (both less than 25 cc) Skin:  Right leg warmer than left. Could not palpate DP, PT pulses.   LABS:  BMET  Recent Labs Lab 11/02/15 0330 11/03/15 0416 11/03/15 1612  NA 138 139 137  K 3.7 4.2 4.3  CL 104 108 109  CO2 27 25 20*  BUN 13 12 14   CREATININE 0.77 0.62 0.64  GLUCOSE 133* 81 114*    Electrolytes  Recent Labs Lab 11/02/15 0330 11/02/15 2202 11/03/15 0416 11/03/15 1612  CALCIUM 8.5*  --  8.1* 7.8*  MG  --  1.2*  --   --     CBC  Recent Labs Lab 11/03/15 1612 11/03/15 2122 11/04/15 0347  WBC 17.4* 18.0* 13.3*  HGB 5.8* 9.2* 8.8*  HCT 18.0* 27.9* 26.1*  PLT 342 369 264    Coag's  Recent Labs Lab 11/02/15 0330 11/03/15 0416 11/03/15 2143 11/04/15 0347  APTT  --  42* 36 37*  INR 1.22  --  1.22 1.21    Sepsis Markers  Recent Labs Lab 11/03/15 1612 11/03/15 2329  LATICACIDVEN 3.1* 1.6    ABG  Recent Labs Lab 11/03/15 1553 11/03/15 2121  PHART 7.440 7.400  PCO2ART 30.3* 32.3  PO2ART 69.7* 74.0*    Liver Enzymes  Recent Labs Lab 11/03/15 1612  AST 31  ALT 13*  ALKPHOS 85  BILITOT 0.7  ALBUMIN 1.6*    Cardiac Enzymes  Recent Labs Lab 11/03/15 1612 11/03/15 2223 11/04/15 0347  TROPONINI 0.14* 1.77* 3.83*  Glucose  Recent Labs Lab 11/03/15 1324 11/03/15 1544 11/03/15 2020 11/03/15 2317 11/04/15 0316 11/04/15 0907  GLUCAP 107* 108* 241* 244* 136* 122*    Imaging Ct Angio Head W Or Wo Contrast  Result Date: 11/03/2015 CLINICAL DATA:  Left-sided weakness and aphasia. EXAM: CT ANGIOGRAPHY HEAD AND NECK CT CEREBRAL PERFUSION WITH CONTRAST TECHNIQUE: Multidetector CT imaging of the head and neck was performed using the standard protocol during bolus administration of intravenous contrast. Multiplanar CT image reconstructions and MIPs were obtained to evaluate the vascular anatomy. Carotid stenosis measurements (when applicable) are obtained utilizing NASCET criteria, using the distal internal carotid diameter as the denominator. CT cerebral  perfusion imaging was also performed during the dynamic injection of intravenous contrast. Post processing was performed. CONTRAST:  90 mL Isovue 370 COMPARISON:  Head MRI/ MRA 09/20/2013 FINDINGS: CTA NECK FINDINGS Aortic arch: Three vessel aortic arch with extensive atherosclerotic calcified plaque. There is approximately 60% stenosis of the proximal brachiocephalic artery and proximal right subclavian artery. Atherosclerotic plaque in a focal when or intimal flap in the proximal left subclavian artery contribute to 65% stenosis. Right carotid system: Common carotid artery is patent without stenosis. Internal carotid artery is occluded at its origin without reconstitution in the neck. There is mild narrowing of the ECA origin. Left carotid system: Moderate atherosclerotic plaque at the carotid bifurcation without significant common or internal carotid artery stenosis. Mild proximal ECA stenosis. Vertebral arteries: Vertebral arteries are patent and codominant. There is mild right V2 narrowing at the C2 and C5 levels due to calcified plaque. There is mild left V2 narrowing due to degenerative vertebral spurring at C5-6 and C6-7. Skeleton: Moderate to advanced disc degeneration from C4-5 to C6-7. Other neck: 8 mm calculus in the distal left submandibular duct without significant ductal dilatation or inflammatory change. Small left thyroid nodule. Upper chest: Interlobular septal thickening in the lung apices with partially visualized ground-glass opacity posteriorly in the left upper lobe and superior segment segments of the left greater than right upper lobes. Review of the MIP images confirms the above findings CTA HEAD FINDINGS Anterior circulation: Chronically occluded right ICA with reconstitution of the terminus via a small but patent right A1 segment, unchanged. Right M1 segment is widely patent. There is at most mild narrowing of the proximal right M2 inferior division. Decreased number of distal right MCA  branch vessels in the right parietal region is chronic. Intracranial left ICA is patent with extensive atherosclerosis though only resulting in at most mild stenosis. 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 Left A1 segment is widely patent, as is the anterior communicating artery. A2 segments are patent. Left MCA is patent without evidence of major branch occlusion or significant proximal stenosis. Posterior circulation: Intracranial vertebral arteries are patent to the basilar with prominent atherosclerosis, greater on the right. There is severe proximal right V4 stenosis with additional moderate tandem stenoses. PICA origins appear patent, potentially with a high-grade stenosis on the right. AICA and SCA origins are patent. Basilar artery is patent without stenosis. Posterior communicating arteries are not identified. PCAs are patent with mild distal left P1 narrowing. No aneurysm. Venous sinuses: Patent. Anatomic variants: Small right A1. Review of the MIP images confirms the above findings CT CEREBRAL PERFUSION FINDINGS There is mildly prolonged transit time throughout most of the right MCA territory, with slightly greater delayed transit (6 seconds greater than the contralateral side) in  the deep watershed territory/centrum semiovale. Cerebral blood flow is preserved. Cerebral blood volume is also normal to mildly increased. No diminished CBF/CBV suggestive of acute infarction is seen. IMPRESSION: 1. 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 2. Prolonged transit time throughout the right MCA territory due to #1 with compensated cerebral blood flow parameters. No evidence of acute infarct. 3. Chronically decreased number of distal right MCA branch vessels. No evidence of acute major branch occlusion. 4. Severe right V4 vertebral artery stenosis. 5.  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 6. Aortic atherosclerosis. 7. 8 mm left submandibular duct calculus. 8. Interlobular septal thickening and ground-glass opacities in lung apices, possibly reflecting edema. Correlate with chest radiography. These results were called by telephone at the time of interpretation on 11/03/2015 at 4:00 pm to Dr. Desmond Lope, who verbally acknowledged these results. Electronically Signed   By: Sebastian Ache M.D.   On: 11/03/2015 16:31   Ct Angio Neck W Or Wo Contrast  Result Date: 11/03/2015 CLINICAL DATA:  Left-sided weakness and aphasia. EXAM: CT ANGIOGRAPHY HEAD AND NECK CT CEREBRAL PERFUSION WITH CONTRAST TECHNIQUE: Multidetector CT imaging of the head and neck was performed using the standard protocol during bolus administration of intravenous contrast. Multiplanar CT image reconstructions and MIPs were obtained to evaluate the vascular anatomy. Carotid stenosis measurements (when applicable) are obtained utilizing NASCET criteria, using the distal internal carotid diameter as the denominator. CT cerebral perfusion imaging was also performed during the dynamic injection of intravenous contrast. Post processing was performed. CONTRAST:  90 mL Isovue 370 COMPARISON:  Head MRI/ MRA 09/20/2013 FINDINGS: CTA NECK FINDINGS Aortic arch: Three vessel aortic arch with extensive atherosclerotic calcified plaque. There is approximately 60% stenosis of the proximal brachiocephalic artery and proximal right subclavian artery. Atherosclerotic plaque in a focal when or intimal flap in the proximal left subclavian artery contribute to 65% stenosis. Right carotid system: Common carotid artery is patent without stenosis. Internal carotid artery is occluded at its origin without reconstitution in the neck. There is mild narrowing of the ECA origin. Left carotid system: Moderate atherosclerotic plaque at the carotid bifurcation without significant common or internal carotid  artery stenosis. Mild proximal ECA stenosis. Vertebral arteries: Vertebral arteries are patent and codominant. There is mild right V2 narrowing at the C2 and C5 levels due to calcified plaque. There is mild left V2 narrowing due to degenerative vertebral spurring at C5-6 and C6-7. Skeleton: Moderate to advanced disc degeneration from C4-5 to C6-7. Other neck: 8 mm calculus in the distal left submandibular duct without significant ductal dilatation or inflammatory change. Small left thyroid nodule. Upper chest: Interlobular septal thickening in the lung apices with partially visualized ground-glass opacity posteriorly in the left upper lobe and superior segment segments of the left greater than right upper lobes. Review of the MIP images confirms the above findings CTA HEAD FINDINGS Anterior circulation: Chronically occluded right ICA with reconstitution of the terminus via a small but patent right A1 segment, unchanged. Right M1 segment is widely patent. There is at most mild narrowing of the proximal right M2 inferior division. Decreased number of distal right MCA branch vessels in the right parietal region is chronic. Intracranial left ICA is patent with extensive atherosclerosis though only resulting in at most mild stenosis. 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 Left A1 segment is widely patent, as is the anterior communicating artery. A2  segments are patent. Left MCA is patent without evidence of major branch occlusion or significant proximal stenosis. Posterior circulation: Intracranial vertebral arteries are patent to the basilar with prominent atherosclerosis, greater on the right. There is severe proximal right V4 stenosis with additional moderate tandem stenoses. PICA origins appear patent, potentially with a high-grade stenosis on the right. AICA and SCA origins are patent. Basilar  artery is patent without stenosis. Posterior communicating arteries are not identified. PCAs are patent with mild distal left P1 narrowing. No aneurysm. Venous sinuses: Patent. Anatomic variants: Small right A1. Review of the MIP images confirms the above findings CT CEREBRAL PERFUSION FINDINGS There is mildly prolonged transit time throughout most of the right MCA territory, with slightly greater delayed transit (6 seconds greater than the contralateral side) in the deep watershed territory/centrum semiovale. Cerebral blood flow is preserved. Cerebral blood volume is also normal to mildly increased. No diminished CBF/CBV suggestive of acute infarction is seen. IMPRESSION: 1. 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 2. Prolonged transit time throughout the right MCA territory due to #1 with compensated cerebral blood flow parameters. No evidence of acute infarct. 3. Chronically decreased number of distal right MCA branch vessels. No evidence of acute major branch occlusion. 4. Severe right V4 vertebral artery stenosis. 5. 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 6. Aortic atherosclerosis. 7. 8 mm left submandibular duct calculus. 8. Interlobular septal thickening and ground-glass opacities in lung apices, possibly reflecting edema. Correlate with chest radiography. These results were called by telephone at the time of interpretation on 11/03/2015 at 4:00 pm to Dr. Desmond Lope, who verbally acknowledged these results. Electronically Signed   By: Sebastian Ache M.D.   On: 11/03/2015 16:31   Ct Cerebral Perfusion W Contrast  Result Date: 11/03/2015 CLINICAL DATA:  Left-sided weakness and aphasia. EXAM: CT ANGIOGRAPHY HEAD AND NECK CT CEREBRAL PERFUSION WITH CONTRAST TECHNIQUE: Multidetector CT imaging of the head and neck was performed using the standard protocol during bolus administration of intravenous contrast. Multiplanar CT image reconstructions and  MIPs were obtained to evaluate the vascular anatomy. Carotid stenosis measurements (when applicable) are obtained utilizing NASCET criteria, using the distal internal carotid diameter as the denominator. CT cerebral perfusion imaging was also performed during the dynamic injection of intravenous contrast. Post processing was performed. CONTRAST:  90 mL Isovue 370 COMPARISON:  Head MRI/ MRA 09/20/2013 FINDINGS: CTA NECK FINDINGS Aortic arch: Three vessel aortic arch with extensive atherosclerotic calcified plaque. There is approximately 60% stenosis of the proximal brachiocephalic artery and proximal right subclavian artery. Atherosclerotic plaque in a focal when or intimal flap in the proximal left subclavian artery contribute to 65% stenosis. Right carotid system: Common carotid artery is patent without stenosis. Internal carotid artery is occluded at its origin without reconstitution in the neck. There is mild narrowing of the ECA origin. Left carotid system: Moderate atherosclerotic plaque at the carotid bifurcation without significant common or internal carotid artery stenosis. Mild proximal ECA stenosis. Vertebral arteries: Vertebral arteries are patent and codominant. There is mild right V2 narrowing at the C2 and C5 levels due to calcified plaque. There is mild left V2 narrowing due to degenerative vertebral spurring at C5-6 and C6-7. Skeleton: Moderate to advanced disc degeneration from C4-5 to C6-7. Other neck: 8 mm calculus in the distal left submandibular duct without significant ductal dilatation or inflammatory change. Small left thyroid nodule. Upper chest: Interlobular septal thickening in the lung apices with partially visualized ground-glass opacity posteriorly in the left  upper lobe and superior segment segments of the left greater than right upper lobes. Review of the MIP images confirms the above findings CTA HEAD FINDINGS Anterior circulation: Chronically occluded right ICA with reconstitution  of the terminus via a small but patent right A1 segment, unchanged. Right M1 segment is widely patent. There is at most mild narrowing of the proximal right M2 inferior division. Decreased number of distal right MCA branch vessels in the right parietal region is chronic. Intracranial left ICA is patent with extensive atherosclerosis though only resulting in at most mild stenosis. 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 Left A1 segment is widely patent, as is the anterior communicating artery. A2 segments are patent. Left MCA is patent without evidence of major branch occlusion or significant proximal stenosis. Posterior circulation: Intracranial vertebral arteries are patent to the basilar with prominent atherosclerosis, greater on the right. There is severe proximal right V4 stenosis with additional moderate tandem stenoses. PICA origins appear patent, potentially with a high-grade stenosis on the right. AICA and SCA origins are patent. Basilar artery is patent without stenosis. Posterior communicating arteries are not identified. PCAs are patent with mild distal left P1 narrowing. No aneurysm. Venous sinuses: Patent. Anatomic variants: Small right A1. Review of the MIP images confirms the above findings CT CEREBRAL PERFUSION FINDINGS There is mildly prolonged transit time throughout most of the right MCA territory, with slightly greater delayed transit (6 seconds greater than the contralateral side) in the deep watershed territory/centrum semiovale. Cerebral blood flow is preserved. Cerebral blood volume is also normal to mildly increased. No diminished CBF/CBV suggestive of acute infarction is seen. IMPRESSION: 1. 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 2. Prolonged transit time throughout the right MCA territory due to #1 with compensated  cerebral blood flow parameters. No evidence of acute infarct. 3. Chronically decreased number of distal right MCA branch vessels. No evidence of acute major branch occlusion. 4. Severe right V4 vertebral artery stenosis. 5. 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 6. Aortic atherosclerosis. 7. 8 mm left submandibular duct calculus. 8. Interlobular septal thickening and ground-glass opacities in lung apices, possibly reflecting edema. Correlate with chest radiography. These results were called by telephone at the time of interpretation on 11/03/2015 at 4:00 pm to Dr. Desmond Lope, who verbally acknowledged these results. Electronically Signed   By: Sebastian Ache M.D.   On: 11/03/2015 16:31   Dg Chest Port 1 View  Result Date: 11/03/2015 CLINICAL DATA:  Right central venous line placement EXAM: PORTABLE CHEST 1 VIEW COMPARISON:  08/18/2015 chest radiograph. FINDINGS: Right internal jugular central venous catheter terminates in the upper third of the superior vena cava. Stable cardiomediastinal silhouette with normal heart size with aortic atherosclerosis. No pneumothorax. No pleural effusion. Mild diffuse prominence of the central interstitial markings. No acute consolidative airspace disease. IMPRESSION: 1. Right internal jugular central venous catheter terminates in the upper third of the superior vena cava. No pneumothorax. 2. Top-normal heart size. Prominence of the central interstitial markings, favor vascular crowding due to low lung volumes . 3. Aortic atherosclerosis. Electronically Signed   By: Delbert Phenix M.D.   On: 11/03/2015 17:31   Dg Chest Port 1v Same Day  Result Date: 11/03/2015 CLINICAL DATA:  Post intubation. EXAM: PORTABLE CHEST 1 VIEW COMPARISON:  11/03/2015 FINDINGS: Patient is status post intubation. Endotracheal tube overlies the tracheal air column and terminates 15  mm above the carina. Retraction with approximately 1-2 cm may be considered. Enteric catheter has been  placed, tip collimated off the image. Right IJ approach central venous catheter is stable. Cardiomediastinal silhouette is normal. Mediastinal contours appear intact. Calcific atherosclerotic disease of the aorta seen. There is no evidence of pneumothorax. Low lung volumes with interstitial pulmonary edema. Osseous structures are without acute abnormality. Soft tissues are grossly normal. IMPRESSION: Status post intubation. Slight retraction of the endotracheal tube may be considered. Interstitial pulmonary edema. Electronically Signed   By: Ted Mcalpine M.D.   On: 11/03/2015 19:17   Dg Abd Portable 1v  Result Date: 11/03/2015 CLINICAL DATA:  Orogastric tube placement. EXAM: PORTABLE ABDOMEN - 1 VIEW COMPARISON:  None. FINDINGS: The bowel gas pattern is normal. Enteric catheter is seen overlying the expected location of gastric body. Bilateral iliac vascular stents are seen. Soft tissue details are obscured by a motion artifact. IMPRESSION: Nonobstructive bowel gas pattern. Enteric catheter overlies the expected location of gastric body. Electronically Signed   By: Ted Mcalpine M.D.   On: 11/03/2015 19:20   Ct Head Code Stroke Wo Contrast  Addendum Date: 11/03/2015   ADDENDUM REPORT: 11/03/2015 15:12 ADDENDUM: These results were called by telephone at the time of interpretation on 11/03/2015 at 3:05 pm to Dr. Desmond Lope, who verbally acknowledged these results. Electronically Signed   By: Sebastian Ache M.D.   On: 11/03/2015 15:12   Result Date: 11/03/2015 CLINICAL DATA:  Code stroke. Slurred speech with left-sided deficits. EXAM: CT HEAD WITHOUT CONTRAST TECHNIQUE: Contiguous axial images were obtained from the base of the skull through the vertex without intravenous contrast. COMPARISON:  Brain MRI 03/03/2013 and CT 03/02/2013 FINDINGS: Brain: Mild motion artifact. There is no evidence of acute cortical infarct, intracranial hemorrhage, mass, midline shift, or extra-axial fluid collection.  Ventricles and sulci are normal in size for age. Patchy cerebral white matter hypodensities are stable to slightly progressive and nonspecific but compatible with mild chronic small vessel ischemic disease. A small chronic left cerebellar infarct is unchanged. Vascular: Calcified atherosclerosis at the skullbase. Skull: No fracture or focal osseous lesion. Sinuses/Orbits: Prior right cataract extraction.  Clear sinuses. Other: None. ASPECTS [facility] Stroke Program Early CT Score) - Ganglionic level infarction (caudate, lentiform nuclei, internal capsule, insula, M1-M3 cortex): 7 - Supraganglionic infarction (M4-M6 cortex): 3 Total score (0-10 with 10 being normal): 10 IMPRESSION: 1. No evidence of acute intracranial abnormality. 2. ASPECTS is 10. 3. Mild chronic small vessel ischemic disease. Chronic left cerebellar infarct. Electronically Signed: By: Sebastian Ache M.D. On: 11/03/2015 14:48     STUDIES:  MRI L foot >> Osteomyelitis involving 5th metatarsal and proximal phalanx fifth toe Head Ct / CTA 10/6  >> no acute bleed, chronic near complete occlusion; cellulitis of dorsal surface left foot  DG chest 10/6 > Low lung volumes and interstitial pulmonary edema noted; ETT 15 mm above carina  DG abdomen 10/6 > OG tube in place  CULTURES: Blood 10/6 >>  Urine cx 10/6 >> MRSA screen 10/3 >> negative  ANTIBIOTICS: vanco 10/6 >>  Cefepime 10/6 >> Aztreonam 10/3 > 10/4   SIGNIFICANT EVENTS: 10/5 s/p L fem-pop bypass 10/6 to ICU with acute shock and AMS  LINES/TUBES: ETT 10/6 >> R IJ 10/6 >> PIV x 2 >> Anterior groin and distal leg bulb drains x 2 10/5 >>  DISCUSSION: 65 yo woman w PVD, L plantar foot abscess + osteo, s/p L fem-pop abscess 10/5. Was well until 12:00 10/6, developed lethargy,  AMS, shock. Responded transiently to narcan. Initial eval has revealed acute anemia, now receiving PRBC. Will cover for possible sepsis, assess for cardiac contribution. Vascular agreed with watching for  resolution of suspected bleed into L thigh, following serial CBCs. Patient's hgb has stabilized after 2 u PRBCs. Leukocytosis improving on abx to cover osteomyelitis. Troponins elevated to 3.83 without EKG changes.  ASSESSMENT / PLAN:  PULMONARY A: Hypoxemia At risk acute resp failure due to suppressed MS; intubated 10/6 P:   pulm hygiene  Attempt to wean off ventilator 10/7  CARDIOVASCULAR A:  Shock, suspect hemorrhagic, sepsis due to foot wound, to osteomyelitis, acute MI / cardiogenic Elevated troponin setting of hypovolemic shock P:  Follow troponin ECHO to look for wall motion abnormality Pressors discontinued  RENAL A:   No acute issues, at risk for ARF. SCr stable.  P:   Follow BMP and UOP  GASTROINTESTINAL A:   SUP / Hx GERD Nutrition P:   NPO for now given potential need for procedures.  Pepcid IV  HEMATOLOGIC A:   Acute Anemia, suspect bleed into L thigh Hgb stable  P:  S/p 2 U PRBC CBCs q12h Vascular recommended CT abdomen and upper LEs if hgb did not stabilize, deferred for now  INFECTIOUS A:   Possible sepsis, septic shock L foot plantar abscess + toe osteomyelitis P:   Blood cx sent Empiric vanco + cefepime 10/6 >> Vascular and Ortho following, probably will need a debridement sgy next week  ENDOCRINE A:   DM 2. Hgb A1c 7.6 11/02/15   P:   SSI coverage  NEUROLOGIC A:   Acute altered MS, consider acute CVA although MS and LUE movement have improved  P:   RASS goal: 0 Head CT with no acute bleed S/p narcan Still at some risk that this was CVA, appreciate neuro recs, Would still plan to get MRI brain Continue ASA Minimize sedating meds    FAMILY  - Updates: discussed status with family 10/6  - Inter-disciplinary family meet or Palliative Care meeting due by:  11/10/15  Dani Gobble, MD  [facility], PGY-2  Attending Note:  I have examined patient, reviewed labs, studies and notes. I have discussed the case  with Dr Sampson Goon, and I agree with the data and plans as amended above. 65 with PVD/PAD, L plantar foot abscess + toe osteo, s/p fem-pop bypass on 10/5. She experienced acute decompensation 10/6 - shock with altered MS, likely multifactorial due to anemia, narcotics, sepsis from her L foot. The constellation of sx is concerning for CVA. Head CT scan reassuring. On eval today she is hemodynamically improved, awake and interacting on MV after PRBC, addition of abx. Will continue current care. Goal assess for extubation today.  Independent critical care time is 34 minutes.   Levy Pupa, MD, PhD 11/04/2015, 12:44 PM [facility] [phone] or if no answer [phone]

## 2015-11-04 NOTE — Progress Notes (Signed)
CRITICAL VALUE ALERT  Critical value received:  Troponin 3.83  Date of notification:  11/04/2015  Time of notification:  0530  Critical value read back: Yes  Nurse who received alert:  Clema Skousen RN  MD notified (1st page):  Elink  Time of first page:  0530  MD notified (2nd page):  Time of second page:  Responding MD:  Jamison Neighbor  Time MD responded:  [phone]  Will continue to monitor

## 2015-11-04 NOTE — Progress Notes (Signed)
[facility] [place] [facility]  Subjective  - awake moving all extremities following commands   Objective (!) 151/68 90 98.6 F (37 C) (Oral) 19 99%  Intake/Output Summary (Last 24 hours) at 11/04/15 1012 Last data filed at 11/04/15 1000  Gross per 24 hour  Intake          1670.63 ml  Output             1225 ml  Net           445.63 ml   Left foot warm, incisions clean, necrotic wound plantar aspect of foot  Assessment/Planning: Acute blood loss anemia, no further swelling of leg hemoglobin rose appropriately, patent bypass Hopefully extubate later today Antibiotics per ID  Fabienne Bruns 11/04/2015 10:12 AM --  Laboratory Lab Results:  Recent Labs  11/03/15 2122 11/04/15 0347  WBC 18.0* 13.3*  HGB 9.2* 8.8*  HCT 27.9* 26.1*  PLT 369 264   BMET  Recent Labs  11/03/15 0416 11/03/15 1612  NA 139 137  K 4.2 4.3  CL 108 109  CO2 25 20*  GLUCOSE 81 114*  BUN 12 14  CREATININE 0.62 0.64  CALCIUM 8.1* 7.8*    COAG Lab Results  Component Value Date   INR 1.21 11/04/2015   INR 1.22 11/03/2015   INR 1.22 11/02/2015   No results found for: PTT

## 2015-11-05 ENCOUNTER — Inpatient Hospital Stay (HOSPITAL_COMMUNITY): Payer: Medicare Other

## 2015-11-05 DIAGNOSIS — E1142 Type 2 diabetes mellitus with diabetic polyneuropathy: Secondary | ICD-10-CM

## 2015-11-05 DIAGNOSIS — L02612 Cutaneous abscess of left foot: Secondary | ICD-10-CM

## 2015-11-05 DIAGNOSIS — R6521 Severe sepsis with septic shock: Secondary | ICD-10-CM

## 2015-11-05 DIAGNOSIS — A419 Sepsis, unspecified organism: Secondary | ICD-10-CM

## 2015-11-05 LAB — URINE CULTURE: Culture: NO GROWTH

## 2015-11-05 LAB — CBC
HCT: 24.3 % — ABNORMAL LOW (ref 36.0–46.0)
HEMATOCRIT: 25.4 % — AB (ref 36.0–46.0)
HEMOGLOBIN: 7.8 g/dL — AB (ref 12.0–15.0)
Hemoglobin: 8.3 g/dL — ABNORMAL LOW (ref 12.0–15.0)
MCH: 28.7 pg (ref 26.0–34.0)
MCH: 28.3 pg (ref 26.0–34.0)
MCHC: 32.7 g/dL (ref 30.0–36.0)
MCHC: 32.1 g/dL (ref 30.0–36.0)
MCV: 87.9 fL (ref 78.0–100.0)
MCV: 88 fL (ref 78.0–100.0)
Platelets: 272 10*3/uL (ref 150–400)
Platelets: 276 10*3/uL (ref 150–400)
RBC: 2.89 MIL/uL — ABNORMAL LOW (ref 3.87–5.11)
RBC: 2.76 MIL/uL — ABNORMAL LOW (ref 3.87–5.11)
RDW: 15 % (ref 11.5–15.5)
RDW: 15 % (ref 11.5–15.5)
WBC: 12.6 10*3/uL — AB (ref 4.0–10.5)
WBC: 10.8 10*3/uL — ABNORMAL HIGH (ref 4.0–10.5)

## 2015-11-05 LAB — COMPREHENSIVE METABOLIC PANEL
ALBUMIN: 1.4 g/dL — AB (ref 3.5–5.0)
ALT: 12 U/L — AB (ref 14–54)
ANION GAP: 8 (ref 5–15)
AST: 27 U/L (ref 15–41)
Alkaline Phosphatase: 74 U/L (ref 38–126)
BILIRUBIN TOTAL: 0.4 mg/dL (ref 0.3–1.2)
BUN: 9 mg/dL (ref 6–20)
CALCIUM: 8.1 mg/dL — AB (ref 8.9–10.3)
CO2: 27 mmol/L (ref 22–32)
Chloride: 105 mmol/L (ref 101–111)
Creatinine, Ser: 0.47 mg/dL (ref 0.44–1.00)
GFR calc Af Amer: >60 mL/min (ref >60)
GFR calc non Af Amer: >60 mL/min (ref >60)
GLUCOSE: 96 mg/dL (ref 65–99)
Potassium: 3.3 mmol/L — ABNORMAL LOW (ref 3.5–5.1)
Sodium: 140 mmol/L (ref 135–145)
TOTAL PROTEIN: 5 g/dL — AB (ref 6.5–8.1)

## 2015-11-05 LAB — LIPID PANEL
Cholesterol: 91 mg/dL (ref 0–200)
HDL: 25 mg/dL — AB (ref >40)
LDL Cholesterol: 44 mg/dL (ref 0–99)
TRIGLYCERIDES: 109 mg/dL (ref <150)
Total CHOL/HDL Ratio: 3.6 RATIO
VLDL: 22 mg/dL (ref 0–40)

## 2015-11-05 LAB — GLUCOSE, CAPILLARY
GLUCOSE-CAPILLARY: 111 mg/dL — AB (ref 65–99)
GLUCOSE-CAPILLARY: 117 mg/dL — AB (ref 65–99)
GLUCOSE-CAPILLARY: 139 mg/dL — AB (ref 65–99)
GLUCOSE-CAPILLARY: 55 mg/dL — AB (ref 65–99)
GLUCOSE-CAPILLARY: 91 mg/dL (ref 65–99)
Glucose-Capillary: 78 mg/dL (ref 65–99)
Glucose-Capillary: 160 mg/dL — ABNORMAL HIGH (ref 65–99)

## 2015-11-05 LAB — C DIFFICILE QUICK SCREEN W PCR REFLEX
C DIFFICILE (CDIFF) TOXIN: NEGATIVE
C Diff antigen: NEGATIVE
C Diff interpretation: NOT DETECTED

## 2015-11-05 MED ORDER — ASPIRIN EC 81 MG PO TBEC
81.0000 mg | DELAYED_RELEASE_TABLET | Freq: Every day | ORAL | Status: DC
  Administered 2015-11-05 – 2015-11-14 (×9): 81 mg via ORAL
  Filled 2015-11-05 (×9): qty 1

## 2015-11-05 MED ORDER — CLOPIDOGREL BISULFATE 75 MG PO TABS
75.0000 mg | ORAL_TABLET | Freq: Every day | ORAL | Status: DC
  Administered 2015-11-05 – 2015-11-14 (×9): 75 mg via ORAL
  Filled 2015-11-05 (×10): qty 1

## 2015-11-05 MED ORDER — COLLAGENASE 250 UNIT/GM EX OINT
TOPICAL_OINTMENT | Freq: Every day | CUTANEOUS | Status: DC
  Administered 2015-11-05: 1 via TOPICAL
  Administered 2015-11-06 – 2015-11-09 (×4): via TOPICAL
  Filled 2015-11-05: qty 30

## 2015-11-05 NOTE — Progress Notes (Signed)
eLink Physician-Brief Progress Note Patient Name: [name] [name] DOB: 06-Feb-1950 MRN: [id number]   Date of Service  11/05/2015  HPI/Events of Note  Camera check on patient postextubation. Sleeping comfortably. Currently on Venturi mask respiratory rate 24, heart rate 100, saturation 97% with BP 123/48. No signs of accessory muscle use.   eICU Interventions  Continuing close ICU monitoring.      Intervention Category Major Interventions: Respiratory failure - evaluation and management  Lawanda Cousins 11/05/2015, 12:17 AM

## 2015-11-05 NOTE — Progress Notes (Signed)
STROKE TEAM PROGRESS NOTE   HISTORY OF PRESENT ILLNESS (per record) [name] [name] is an 65 y.o. female admitted from 8/31-09/29/15 for PAD and PVD with claudication; she had stenting of the distal left common femoral artery during that hospitalization. She had previously had a staged left common femoral endarterectomy with profundoplasty on 09/05/15. Patient underwent a left femoral to below-knee popliteal artery bypass with a non-reversed translocation left great saphenous vein along with an intraoperative angiogram on 11/02/2015. Postoperatively she was noted not wake up or respond in addition she was noted not to be moving her left side very well. Due to this neurology was consult at this morning for possibility of intraoperative stroke.    Addendum: Code stroke was called just after consult. Nurse states she was normal at 12:05. Per patients daughter she has not been normal since her surgery. Patient was brought to CT for emergent CTA and Perfusion.   Date last known well: Unable to determine Time last known well: Unable to determine tPA Given: No: Out of window, Recent surgery   SUBJECTIVE (INTERVAL HISTORY) Extubated y`day and is doing well.  alert and following commands. No family members present. Doing much better overall.Has persistent mild left leg weakness   OBJECTIVE Temp:  [98.3 F (36.8 C)-99.6 F (37.6 C)] 98.3 F (36.8 C) (10/08 1100) Pulse Rate:  [89-118] 96 (10/08 1200) Cardiac Rhythm: Sinus tachycardia (10/08 1000) Resp:  [10-37] 23 (10/08 1200) BP: (113-148)/(46-65) 135/61 (10/08 1200) SpO2:  [83 %-100 %] 100 % (10/08 1200) FiO2 (%):  [40 %-100 %] 55 % (10/07 2155)  CBC:  Recent Labs Lab 10/30/15 1930  11/04/15 2130 11/05/15 1008  WBC 13.3*  < > 12.9* 12.6*  NEUTROABS 10.0*  --   --   --   HGB 10.5*  < > 8.1* 8.3*  HCT 31.2*  < > 24.0* 25.4*  MCV 85.0  < > 86.3 87.9  PLT 445*  < > 274 272  < > = values in this interval not displayed.  Basic Metabolic Panel:   Recent Labs Lab 11/02/15 2202  11/03/15 1612 11/05/15 0458  NA  --   < > 137 140  K  --   < > 4.3 3.3*  CL  --   < > 109 105  CO2  --   < > 20* 27  GLUCOSE  --   < > 114* 96  BUN  --   < > 14 9  CREATININE  --   < > 0.64 0.47  CALCIUM  --   < > 7.8* 8.1*  MG 1.2*  --   --   --   < > = values in this interval not displayed.  Lipid Panel:     Component Value Date/Time   CHOL 91 11/05/2015 0458   TRIG 109 11/05/2015 0458   HDL 25 (L) 11/05/2015 0458   CHOLHDL 3.6 11/05/2015 0458   VLDL 22 11/05/2015 0458   LDLCALC 44 11/05/2015 0458   HgbA1c:  Lab Results  Component Value Date   HGBA1C 7.6 (H) 11/02/2015   Urine Drug Screen: No results found for: LABOPIA, COCAINSCRNUR, LABBENZ, AMPHETMU, THCU, LABBARB    IMAGING  Ct Angio Head and Neck W Or Wo Contrast  10/6/20171 1. 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  2. Prolonged transit time throughout the right MCA territory due to #1 with compensated cerebral blood flow parameters. No evidence of acute infarct.  3. Chronically decreased number of distal  right MCA branch vessels. No evidence of acute major branch occlusion.  4. Severe right V4 vertebral artery stenosis.  5. 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  6. Aortic atherosclerosis.  7. 8 mm left submandibular duct calculus.  8. Interlobular septal thickening and ground-glass opacities in lung apices, possibly reflecting edema. Correlate with chest radiography.     Ct Cerebral Perfusion W Contrast 11/03/2015 1. 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  2. Prolonged transit time throughout the right MCA territory due to #1 with compensated cerebral blood flow parameters.   No evidence of acute infarct.  3. Chronically decreased number of distal right MCA branch vessels. No evidence of acute major branch occlusion.  4. Severe right V4 vertebral  artery stenosis.  5. 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  6. Aortic atherosclerosis.  7. 8 mm left submandibular duct calculus.  8. Interlobular septal thickening and ground-glass opacities in lung apices, possibly reflecting edema. Correlate with chest radiography.     Dg Chest Port 1 View 11/03/2015 1. Right internal jugular central venous catheter terminates in the upper third of the superior vena cava. No pneumothorax.  2. Top-normal heart size. Prominence of the central interstitial markings, favor vascular crowding due to low lung volumes .  3. Aortic atherosclerosis.    Dg Chest Port 1v Same Day 11/03/2015 Status post intubation. Slight retraction of the endotracheal tube may be considered. Interstitial pulmonary edema.    Dg Ang/ext/uni/or Left 11/02/2015 Intraoperative arteriogram as above.     Dg Abd Portable 1v 11/03/2015 Nonobstructive bowel gas pattern. Enteric catheter overlies the expected location of gastric body.     Ct Head Code Stroke Wo Contrast 11/03/2015   1. No evidence of acute intracranial abnormality.  2. ASPECTS is 10.  3. Mild chronic small vessel ischemic disease. Chronic left cerebellar infarct.    PHYSICAL EXAM HEENT-  Normocephalic, no lesions, without obvious abnormality.  Normal external eye and conjunctiva.  Normal TM's bilaterally.  Normal auditory canals and external ears. Normal external nose, mucus membranes and septum.  Normal pharynx. Cardiovascular- S1, S2 normal, pulses palpable throughout   Lungs- chest clear, no wheezing, rales, normal symmetric air entry Abdomen- normal findings: bowel sounds normal Extremities- no edema Lymph-no adenopathy palpable Musculoskeletal-no joint tenderness, deformity or swelling Skin-warm and dry, no hyperpigmentation, vitiligo, or suspicious lesions  Neurological Examination Mental Status: Extubated   awake and alert and follows commands consistently..  Cranial  Nerves: II: no blink to threat, pupils equal, round, reactive to light and accommodation III,IV, VI: ptosis not present, extra-ocular motions intact bilaterally V,VII: face has a left air leak and facial droop, facial light touch sensation normal bilaterally VIII: hearing normal bilaterally IX,X: uvula rises symmetrically XI: bilateral shoulder shrug XII: midline tongue extension Motor: Right :  Upper extremity   5/5                                      Left:     Upper extremity   4/5             Lower extremity   5/5  Lower extremity   4/5 Tone and bulk:normal tone throughout; no atrophy noted Sensory: no response to pain in the left arm or leg Deep Tendon Reflexes: 1+ and symmetric throughout Plantars: Right: downgoing                                Left: downgoing Cerebellar: normal finger-to-nose,  and normal heel-to-shin test Gait: not tested    ASSESSMENT/PLAN [name] [name] is a 65 y.o. female with history of left femoral to below-knee popliteal artery bypass 11/02/2015, diabetes mellitus, peripheral vascular disease, diabetic neuropathy, coronary artery disease with previous MI, migraine headaches, hypertension, hyperlipidemia, presenting with left-sided weakness following surgery. She did not receive IV t-PA due to recent surgery.  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 CTA neck.  2D Echo - pending  LDL -44  HgbA1c - 7.6  VTE prophylaxis - SCDs Diet Carb Modified Fluid consistency: Thin; Room service appropriate? Yes  aspirin 81 mg daily and clopidogrel 75 mg daily prior to admission, now on No antithrombotic  Ongoing aggressive stroke risk factor management  Therapy recommendations:  pending  Disposition: Pending  Hypertension  Stable  Permissive hypertension (OK if < 220/120) but gradually normalize in 5-7 days  Long-term BP goal normotensive  Hyperlipidemia  Home meds:  Lipitor 80 mg daily resumed in hospital  LDL pending, goal < 70  Continue statin at discharge  Diabetes  HgbA1c 7.6, goal < 7.0  Uncontrolled  Other Stroke Risk Factors  Advanced age  Former cigarette smoker - quit smoking 6 years ago.  Obesity, Body mass index is 29.67 kg/m., recommend weight loss, diet and exercise as appropriate   Family hx stroke (Father)  Coronary artery disease  Migraines    Other Active Problems  Pulmonary edema by chest x-ray  Postop anemia  Leukocytosis - osteomyelitis of the left foot - infectious disease following - Vancomycin / Maxipime  NPO   PLAN  Resume antiplatelet therapy when okay from surgical standpoint.   Hospital day # 6  Delton See PA-C [facility] Pager [phone] 11/05/2015, 1:10 PM I have personally examined this patient, reviewed notes, independently viewed imaging studies, participated in medical decision making and plan of care.ROS completed by me personally and pertinent positives fully documented  I have made any additions or clarifications directly to the above note.  Patient has clearly improved.  I suspect patient had a right brain  Small subcortical infarct not seen on CT  with known chronic right ICA occlusion likely due to failure of collaterals circulation. Avoid hypotension and maintain adequate hydration status. Discussion a bedside with family members and answered questions.I do not see need for MRI as it is not going to alter treatment plan. Start aspirin if okay from a surgical standpoint.Await echo results. Mobilize out of bed and therapy consults.Stroke team will sign off. Call for questions. D/W Dr Sharion Dove.Greater than 50% time durintg this 25 minute visit was spent on counselling and  coordination of care about stroke risk and prevention.  Delia Heady, MD Medical Director [facility] Pager: [phone] 11/05/2015 1:10 PM   To contact Stroke Continuity provider, please refer to [email or web address]. After hours, contact General Neurology

## 2015-11-05 NOTE — Progress Notes (Signed)
Pt's R leg is warm to touch, with diminished capillary refill. Unable to doppler or palpate post tib, dorsalis pedis, or popliteal pulse. Pt c/o extreme pain with movement. Md Fields made aware upon his AM rounds. No new orders  Felipa Emory

## 2015-11-05 NOTE — Progress Notes (Signed)
[facility] [place]  Subjective  - feels much better   Objective (!) 145/62 (!) 104 98.8 F (37.1 C) (Oral) (!) 23 91%  Intake/Output Summary (Last 24 hours) at 11/05/15 0839 Last data filed at 11/05/15 0800  Gross per 24 hour  Intake              865 ml  Output             2421 ml  Net            -1556 ml   Left leg foot warm, doppler signals  Assessment/Planning: Patent bypass Left foot wound Dr Lajoyce Corners to see ? TIA Non ST MI will restart plavix/asa, ECHO pending. Will need cardiology eval She is transferring out to telemetry  Fabienne Bruns 11/05/2015 8:39 AM --  Laboratory Lab Results:  Recent Labs  11/04/15 1036 11/04/15 2130  WBC 12.8* 12.9*  HGB 7.8* 8.1*  HCT 23.4* 24.0*  PLT 240 274   BMET  Recent Labs  11/03/15 1612 11/05/15 0458  NA 137 140  K 4.3 3.3*  CL 109 105  CO2 20* 27  GLUCOSE 114* 96  BUN 14 9  CREATININE 0.64 0.47  CALCIUM 7.8* 8.1*    COAG Lab Results  Component Value Date   INR 1.21 11/04/2015   INR 1.22 11/03/2015   INR 1.22 11/02/2015   No results found for: PTT

## 2015-11-05 NOTE — Progress Notes (Signed)
Patient ID: [name] [name] Age, female   DOB: [birth year], 65 y.o.   MRN: [id number] Left foot ulcer with fibrinous exudative tissue. Abscess has resolved. Continue with hydrotherapy daily we will add Santyl dressing changes daily plus dry dressing. We will schedule patient for surgical debridement of the left foot wound early this week.

## 2015-11-05 NOTE — Progress Notes (Signed)
INFECTIOUS DISEASE PROGRESS NOTE  ID: [name] [name] is a 65 y.o. female with  Principal Problem:   Osteomyelitis of left foot (HCC) Active Problems:   Diabetes mellitus type II, uncontrolled (HCC)   Hyperlipidemia with target LDL less than 70   PVD (peripheral vascular disease) with claudication (HCC)   Essential hypertension   Malnutrition of moderate degree   Diabetic foot ulcer (HCC)   S/P femoral-popliteal bypass surgery   Septic shock (HCC)   Encephalopathy acute   Acute blood loss anemia   Acute respiratory failure with hypoxia (HCC)   CVA (cerebral vascular accident) (HCC)  Subjective: Awake and interacts, off vent.   Abtx:  Anti-infectives    Start     Dose/Rate Route Frequency Ordered Stop   11/03/15 2000  ceFEPIme (MAXIPIME) 2 g in dextrose 5 % 50 mL IVPB     2 g 100 mL/hr over 30 Minutes Intravenous Every 12 hours 11/03/15 1913     11/03/15 1900  vancomycin (VANCOCIN) IVPB 750 mg/150 ml premix     750 mg 150 mL/hr over 60 Minutes Intravenous Every 12 hours 11/03/15 1743     11/03/15 1900  aztreonam (AZACTAM) 1 g in dextrose 5 % 50 mL IVPB  Status:  Discontinued     1 g 100 mL/hr over 30 Minutes Intravenous Every 8 hours 11/03/15 1743 11/03/15 1909   11/02/15 0600  vancomycin (VANCOCIN) IVPB 1000 mg/200 mL premix  Status:  Discontinued     1,000 mg 200 mL/hr over 60 Minutes Intravenous On call to O.R. 11/01/15 0854 11/01/15 0910   11/01/15 1300  cefTRIAXone (ROCEPHIN) 1 g in dextrose 5 % 50 mL IVPB     1 g 100 mL/hr over 30 Minutes Intravenous  Once 11/01/15 1215 11/01/15 1407   10/31/15 0600  vancomycin (VANCOCIN) IVPB 750 mg/150 ml premix  Status:  Discontinued     750 mg 150 mL/hr over 60 Minutes Intravenous Every 12 hours 10/31/15 0156 11/02/15 1828   10/31/15 0600  aztreonam (AZACTAM) 1 g in dextrose 5 % 50 mL IVPB  Status:  Discontinued     1 g 100 mL/hr over 30 Minutes Intravenous Every 8 hours 10/31/15 0210 11/01/15 1215   10/31/15 0045  aztreonam  (AZACTAM) 2 g in dextrose 5 % 50 mL IVPB     2 g 100 mL/hr over 30 Minutes Intravenous  Once 10/31/15 0033 10/31/15 0131   10/30/15 2030  vancomycin (VANCOCIN) 1,500 mg in sodium chloride 0.9 % 500 mL IVPB     1,500 mg 250 mL/hr over 120 Minutes Intravenous  Once 10/30/15 2020 10/30/15 2335   10/30/15 2030  levofloxacin (LEVAQUIN) IVPB 750 mg     750 mg 100 mL/hr over 90 Minutes Intravenous  Once 10/30/15 2020 10/30/15 2231      Medications:  Scheduled: . aspirin EC  81 mg Oral Daily  . atorvastatin  80 mg Oral Daily  . ceFEPime (MAXIPIME) IV  2 g Intravenous Q12H  . clopidogrel  75 mg Oral Daily  . gabapentin  300 mg Oral TID  . insulin aspart  0-15 Units Subcutaneous Q4H  . insulin glargine  35 Units Subcutaneous Q2200  . mouth rinse  15 mL Mouth Rinse BID  . pantoprazole sodium  40 mg Per Tube Daily  . polyethylene glycol  17 g Oral Daily  . senna-docusate  1 tablet Oral QHS  . vancomycin  750 mg Intravenous Q12H    Objective: Vital signs in last 24  hours: Temp:  [98.5 F (36.9 C)-99.6 F (37.6 C)] 98.8 F (37.1 C) (10/08 0346) Pulse Rate:  [87-118] 104 (10/08 0800) Resp:  [10-37] 23 (10/08 0800) BP: (113-151)/(46-68) 145/62 (10/08 0800) SpO2:  [83 %-100 %] 91 % (10/08 0800) FiO2 (%):  [40 %-100 %] 55 % (10/07 2155)   General appearance: alert, fatigued and no distress Resp: clear to auscultation bilaterally Cardio: regular rate and rhythm GI: normal findings: soft, non-tender and abnormal findings:  hypoactive bowel sounds Extremities: L foot wrapped.   Lab Results  Recent Labs  11/03/15 1612  11/04/15 1036 11/04/15 2130 11/05/15 0458  WBC 17.4*  < > 12.8* 12.9*  --   HGB 5.8*  < > 7.8* 8.1*  --   HCT 18.0*  < > 23.4* 24.0*  --   NA 137  --   --   --  140  K 4.3  --   --   --  3.3*  CL 109  --   --   --  105  CO2 20*  --   --   --  27  BUN 14  --   --   --  9  CREATININE 0.64  --   --   --  0.47  < > = values in this interval not displayed. Liver  Panel  Recent Labs  11/03/15 1612 11/05/15 0458  PROT 4.8* 5.0*  ALBUMIN 1.6* 1.4*  AST 31 27  ALT 13* 12*  ALKPHOS 85 74  BILITOT 0.7 0.4  BILIDIR 0.1  --   IBILI 0.6  --    Sedimentation Rate No results for input(s): ESRSEDRATE in the last 72 hours. C-Reactive Protein No results for input(s): CRP in the last 72 hours.  Microbiology: Recent Results (from the past 240 hour(s))  Surgical pcr screen     Status: None   Collection Time: 10/31/15  2:02 AM  Result Value Ref Range Status   MRSA, PCR NEGATIVE NEGATIVE Final   Staphylococcus aureus NEGATIVE NEGATIVE Final    Comment:        The Xpert SA Assay (FDA approved for NASAL specimens in patients over 44 years of age), is one component of a comprehensive surveillance program.  Test performance has been validated by [facility] for patients greater than or equal to 56 year old. It is not intended to diagnose infection nor to guide or monitor treatment.   Culture, blood (routine x 2)     Status: None (Preliminary result)   Collection Time: 11/03/15  4:14 PM  Result Value Ref Range Status   Specimen Description BLOOD LEFT HAND  Final   Special Requests IN PEDIATRIC BOTTLE 2CC  Final   Culture NO GROWTH < 24 HOURS  Final   Report Status PENDING  Incomplete  Culture, blood (routine x 2)     Status: None (Preliminary result)   Collection Time: 11/03/15  4:17 PM  Result Value Ref Range Status   Specimen Description BLOOD LEFT WRIST  Final   Special Requests BOTTLES DRAWN AEROBIC ONLY 5CC  Final   Culture NO GROWTH < 24 HOURS  Final   Report Status PENDING  Incomplete  Urine culture     Status: None   Collection Time: 11/03/15  7:04 PM  Result Value Ref Range Status   Specimen Description URINE, CATHETERIZED  Final   Special Requests NONE  Final   Culture NO GROWTH  Final   Report Status 11/04/2015 FINAL  Final    Studies/Results:  Ct Angio Head W Or Wo Contrast  Result Date: 11/03/2015 CLINICAL DATA:   Left-sided weakness and aphasia. EXAM: CT ANGIOGRAPHY HEAD AND NECK CT CEREBRAL PERFUSION WITH CONTRAST TECHNIQUE: Multidetector CT imaging of the head and neck was performed using the standard protocol during bolus administration of intravenous contrast. Multiplanar CT image reconstructions and MIPs were obtained to evaluate the vascular anatomy. Carotid stenosis measurements (when applicable) are obtained utilizing NASCET criteria, using the distal internal carotid diameter as the denominator. CT cerebral perfusion imaging was also performed during the dynamic injection of intravenous contrast. Post processing was performed. CONTRAST:  90 mL Isovue 370 COMPARISON:  Head MRI/ MRA 09/20/2013 FINDINGS: CTA NECK FINDINGS Aortic arch: Three vessel aortic arch with extensive atherosclerotic calcified plaque. There is approximately 60% stenosis of the proximal brachiocephalic artery and proximal right subclavian artery. Atherosclerotic plaque in a focal when or intimal flap in the proximal left subclavian artery contribute to 65% stenosis. Right carotid system: Common carotid artery is patent without stenosis. Internal carotid artery is occluded at its origin without reconstitution in the neck. There is mild narrowing of the ECA origin. Left carotid system: Moderate atherosclerotic plaque at the carotid bifurcation without significant common or internal carotid artery stenosis. Mild proximal ECA stenosis. Vertebral arteries: Vertebral arteries are patent and codominant. There is mild right V2 narrowing at the C2 and C5 levels due to calcified plaque. There is mild left V2 narrowing due to degenerative vertebral spurring at C5-6 and C6-7. Skeleton: Moderate to advanced disc degeneration from C4-5 to C6-7. Other neck: 8 mm calculus in the distal left submandibular duct without significant ductal dilatation or inflammatory change. Small left thyroid nodule. Upper chest: Interlobular septal thickening in the lung apices with  partially visualized ground-glass opacity posteriorly in the left upper lobe and superior segment segments of the left greater than right upper lobes. Review of the MIP images confirms the above findings CTA HEAD FINDINGS Anterior circulation: Chronically occluded right ICA with reconstitution of the terminus via a small but patent right A1 segment, unchanged. Right M1 segment is widely patent. There is at most mild narrowing of the proximal right M2 inferior division. Decreased number of distal right MCA branch vessels in the right parietal region is chronic. Intracranial left ICA is patent with extensive atherosclerosis though only resulting in at most mild stenosis. 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 Left A1 segment is widely patent, as is the anterior communicating artery. A2 segments are patent. Left MCA is patent without evidence of major branch occlusion or significant proximal stenosis. Posterior circulation: Intracranial vertebral arteries are patent to the basilar with prominent atherosclerosis, greater on the right. There is severe proximal right V4 stenosis with additional moderate tandem stenoses. PICA origins appear patent, potentially with a high-grade stenosis on the right. AICA and SCA origins are patent. Basilar artery is patent without stenosis. Posterior communicating arteries are not identified. PCAs are patent with mild distal left P1 narrowing. No aneurysm. Venous sinuses: Patent. Anatomic variants: Small right A1. Review of the MIP images confirms the above findings CT CEREBRAL PERFUSION FINDINGS There is mildly prolonged transit time throughout most of the right MCA territory, with slightly greater delayed transit (6 seconds greater than the contralateral side) in the deep watershed territory/centrum semiovale. Cerebral blood flow is preserved. Cerebral blood volume is  also normal to mildly increased. No diminished CBF/CBV suggestive of acute infarction is seen.  IMPRESSION: 1. 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 2. Prolonged transit time throughout the right MCA territory due to #1 with compensated cerebral blood flow parameters. No evidence of acute infarct. 3. Chronically decreased number of distal right MCA branch vessels. No evidence of acute major branch occlusion. 4. Severe right V4 vertebral artery stenosis. 5. 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 6. Aortic atherosclerosis. 7. 8 mm left submandibular duct calculus. 8. Interlobular septal thickening and ground-glass opacities in lung apices, possibly reflecting edema. Correlate with chest radiography. These results were called by telephone at the time of interpretation on 11/03/2015 at 4:00 pm to Dr. Desmond Lope, who verbally acknowledged these results. Electronically Signed   By: Sebastian Ache M.D.   On: 11/03/2015 16:31   Ct Angio Neck W Or Wo Contrast  Result Date: 11/03/2015 CLINICAL DATA:  Left-sided weakness and aphasia. EXAM: CT ANGIOGRAPHY HEAD AND NECK CT CEREBRAL PERFUSION WITH CONTRAST TECHNIQUE: Multidetector CT imaging of the head and neck was performed using the standard protocol during bolus administration of intravenous contrast. Multiplanar CT image reconstructions and MIPs were obtained to evaluate the vascular anatomy. Carotid stenosis measurements (when applicable) are obtained utilizing NASCET criteria, using the distal internal carotid diameter as the denominator. CT cerebral perfusion imaging was also performed during the dynamic injection of intravenous contrast. Post processing was performed. CONTRAST:  90 mL Isovue 370 COMPARISON:  Head MRI/ MRA 09/20/2013 FINDINGS: CTA NECK FINDINGS Aortic arch: Three vessel aortic arch with extensive atherosclerotic calcified plaque. There is approximately 60% stenosis of the proximal  brachiocephalic artery and proximal right subclavian artery. Atherosclerotic plaque in a focal when or intimal flap in the proximal left subclavian artery contribute to 65% stenosis. Right carotid system: Common carotid artery is patent without stenosis. Internal carotid artery is occluded at its origin without reconstitution in the neck. There is mild narrowing of the ECA origin. Left carotid system: Moderate atherosclerotic plaque at the carotid bifurcation without significant common or internal carotid artery stenosis. Mild proximal ECA stenosis. Vertebral arteries: Vertebral arteries are patent and codominant. There is mild right V2 narrowing at the C2 and C5 levels due to calcified plaque. There is mild left V2 narrowing due to degenerative vertebral spurring at C5-6 and C6-7. Skeleton: Moderate to advanced disc degeneration from C4-5 to C6-7. Other neck: 8 mm calculus in the distal left submandibular duct without significant ductal dilatation or inflammatory change. Small left thyroid nodule. Upper chest: Interlobular septal thickening in the lung apices with partially visualized ground-glass opacity posteriorly in the left upper lobe and superior segment segments of the left greater than right upper lobes. Review of the MIP images confirms the above findings CTA HEAD FINDINGS Anterior circulation: Chronically occluded right ICA with reconstitution of the terminus via a small but patent right A1 segment, unchanged. Right M1 segment is widely patent. There is at most mild narrowing of the proximal right M2 inferior division. Decreased number of distal right MCA branch vessels in the right parietal region is chronic. Intracranial left ICA is patent with extensive atherosclerosis though only resulting in at most mild stenosis. 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 Left A1 segment is widely  patent, as is the anterior communicating artery. A2 segments are patent. Left MCA is patent without evidence of major branch occlusion or significant proximal stenosis. Posterior circulation: Intracranial vertebral arteries are patent to the basilar with  prominent atherosclerosis, greater on the right. There is severe proximal right V4 stenosis with additional moderate tandem stenoses. PICA origins appear patent, potentially with a high-grade stenosis on the right. AICA and SCA origins are patent. Basilar artery is patent without stenosis. Posterior communicating arteries are not identified. PCAs are patent with mild distal left P1 narrowing. No aneurysm. Venous sinuses: Patent. Anatomic variants: Small right A1. Review of the MIP images confirms the above findings CT CEREBRAL PERFUSION FINDINGS There is mildly prolonged transit time throughout most of the right MCA territory, with slightly greater delayed transit (6 seconds greater than the contralateral side) in the deep watershed territory/centrum semiovale. Cerebral blood flow is preserved. Cerebral blood volume is also normal to mildly increased. No diminished CBF/CBV suggestive of acute infarction is seen. IMPRESSION: 1. 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 2. Prolonged transit time throughout the right MCA territory due to #1 with compensated cerebral blood flow parameters. No evidence of acute infarct. 3. Chronically decreased number of distal right MCA branch vessels. No evidence of acute major branch occlusion. 4. Severe right V4 vertebral artery stenosis. 5. 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 6. Aortic atherosclerosis. 7. 8 mm left submandibular duct calculus. 8. Interlobular septal thickening and ground-glass opacities in lung apices, possibly reflecting edema. Correlate with chest radiography. These results were called by telephone at the time of interpretation on 11/03/2015  at 4:00 pm to Dr. Desmond Lope, who verbally acknowledged these results. Electronically Signed   By: Sebastian Ache M.D.   On: 11/03/2015 16:31   Ct Cerebral Perfusion W Contrast  Result Date: 11/03/2015 CLINICAL DATA:  Left-sided weakness and aphasia. EXAM: CT ANGIOGRAPHY HEAD AND NECK CT CEREBRAL PERFUSION WITH CONTRAST TECHNIQUE: Multidetector CT imaging of the head and neck was performed using the standard protocol during bolus administration of intravenous contrast. Multiplanar CT image reconstructions and MIPs were obtained to evaluate the vascular anatomy. Carotid stenosis measurements (when applicable) are obtained utilizing NASCET criteria, using the distal internal carotid diameter as the denominator. CT cerebral perfusion imaging was also performed during the dynamic injection of intravenous contrast. Post processing was performed. CONTRAST:  90 mL Isovue 370 COMPARISON:  Head MRI/ MRA 09/20/2013 FINDINGS: CTA NECK FINDINGS Aortic arch: Three vessel aortic arch with extensive atherosclerotic calcified plaque. There is approximately 60% stenosis of the proximal brachiocephalic artery and proximal right subclavian artery. Atherosclerotic plaque in a focal when or intimal flap in the proximal left subclavian artery contribute to 65% stenosis. Right carotid system: Common carotid artery is patent without stenosis. Internal carotid artery is occluded at its origin without reconstitution in the neck. There is mild narrowing of the ECA origin. Left carotid system: Moderate atherosclerotic plaque at the carotid bifurcation without significant common or internal carotid artery stenosis. Mild proximal ECA stenosis. Vertebral arteries: Vertebral arteries are patent and codominant. There is mild right V2 narrowing at the C2 and C5 levels due to calcified plaque. There is mild left V2 narrowing due to degenerative vertebral spurring at C5-6 and C6-7. Skeleton: Moderate to advanced disc degeneration from C4-5 to C6-7. Other  neck: 8 mm calculus in the distal left submandibular duct without significant ductal dilatation or inflammatory change. Small left thyroid nodule. Upper chest: Interlobular septal thickening in the lung apices with partially visualized ground-glass opacity posteriorly in the left upper lobe and superior segment segments of the left greater than right upper lobes. Review of the MIP images confirms the above findings CTA HEAD FINDINGS Anterior circulation:  Chronically occluded right ICA with reconstitution of the terminus via a small but patent right A1 segment, unchanged. Right M1 segment is widely patent. There is at most mild narrowing of the proximal right M2 inferior division. Decreased number of distal right MCA branch vessels in the right parietal region is chronic. Intracranial left ICA is patent with extensive atherosclerosis though only resulting in at most mild stenosis. 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 Left A1 segment is widely patent, as is the anterior communicating artery. A2 segments are patent. Left MCA is patent without evidence of major branch occlusion or significant proximal stenosis. Posterior circulation: Intracranial vertebral arteries are patent to the basilar with prominent atherosclerosis, greater on the right. There is severe proximal right V4 stenosis with additional moderate tandem stenoses. PICA origins appear patent, potentially with a high-grade stenosis on the right. AICA and SCA origins are patent. Basilar artery is patent without stenosis. Posterior communicating arteries are not identified. PCAs are patent with mild distal left P1 narrowing. No aneurysm. Venous sinuses: Patent. Anatomic variants: Small right A1. Review of the MIP images confirms the above findings CT CEREBRAL PERFUSION FINDINGS There is mildly prolonged transit time throughout most of the  right MCA territory, with slightly greater delayed transit (6 seconds greater than the contralateral side) in the deep watershed territory/centrum semiovale. Cerebral blood flow is preserved. Cerebral blood volume is also normal to mildly increased. No diminished CBF/CBV suggestive of acute infarction is seen. IMPRESSION: 1. 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 2. Prolonged transit time throughout the right MCA territory due to #1 with compensated cerebral blood flow parameters. No evidence of acute infarct. 3. Chronically decreased number of distal right MCA branch vessels. No evidence of acute major branch occlusion. 4. Severe right V4 vertebral artery stenosis. 5. 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 6. Aortic atherosclerosis. 7. 8 mm left submandibular duct calculus. 8. Interlobular septal thickening and ground-glass opacities in lung apices, possibly reflecting edema. Correlate with chest radiography. These results were called by telephone at the time of interpretation on 11/03/2015 at 4:00 pm to Dr. Desmond Lopeurk, who verbally acknowledged these results. Electronically Signed   By: Sebastian AcheAllen  Grady M.D.   On: 11/03/2015 16:31   Dg Chest Port 1 View  Result Date: 11/03/2015 CLINICAL DATA:  Right central venous line placement EXAM: PORTABLE CHEST 1 VIEW COMPARISON:  08/18/2015 chest radiograph. FINDINGS: Right internal jugular central venous catheter terminates in the upper third of the superior vena cava. Stable cardiomediastinal silhouette with normal heart size with aortic atherosclerosis. No pneumothorax. No pleural effusion. Mild diffuse prominence of the central interstitial markings. No acute consolidative airspace disease. IMPRESSION: 1. Right internal jugular central venous catheter terminates in the upper third of the superior vena cava. No pneumothorax. 2. Top-normal heart size. Prominence of the central interstitial markings, favor  vascular crowding due to low lung volumes . 3. Aortic atherosclerosis. Electronically Signed   By: Delbert PhenixJason A Poff M.D.   On: 11/03/2015 17:31   Dg Chest Port 1v Same Day  Result Date: 11/03/2015 CLINICAL DATA:  Post intubation. EXAM: PORTABLE CHEST 1 VIEW COMPARISON:  11/03/2015 FINDINGS: Patient is status post intubation. Endotracheal tube overlies the tracheal air column and terminates 15 mm above the carina. Retraction with approximately 1-2 cm may be considered. Enteric catheter has been placed, tip collimated off the image. Right IJ approach central venous catheter  is stable. Cardiomediastinal silhouette is normal. Mediastinal contours appear intact. Calcific atherosclerotic disease of the aorta seen. There is no evidence of pneumothorax. Low lung volumes with interstitial pulmonary edema. Osseous structures are without acute abnormality. Soft tissues are grossly normal. IMPRESSION: Status post intubation. Slight retraction of the endotracheal tube may be considered. Interstitial pulmonary edema. Electronically Signed   By: Ted Mcalpine M.D.   On: 11/03/2015 19:17   Dg Abd Portable 1v  Result Date: 11/03/2015 CLINICAL DATA:  Orogastric tube placement. EXAM: PORTABLE ABDOMEN - 1 VIEW COMPARISON:  None. FINDINGS: The bowel gas pattern is normal. Enteric catheter is seen overlying the expected location of gastric body. Bilateral iliac vascular stents are seen. Soft tissue details are obscured by a motion artifact. IMPRESSION: Nonobstructive bowel gas pattern. Enteric catheter overlies the expected location of gastric body. Electronically Signed   By: Ted Mcalpine M.D.   On: 11/03/2015 19:20   Ct Head Code Stroke Wo Contrast  Addendum Date: 11/03/2015   ADDENDUM REPORT: 11/03/2015 15:12 ADDENDUM: These results were called by telephone at the time of interpretation on 11/03/2015 at 3:05 pm to Dr. Desmond Lope, who verbally acknowledged these results. Electronically Signed   By: Sebastian Ache M.D.   On:  11/03/2015 15:12   Result Date: 11/03/2015 CLINICAL DATA:  Code stroke. Slurred speech with left-sided deficits. EXAM: CT HEAD WITHOUT CONTRAST TECHNIQUE: Contiguous axial images were obtained from the base of the skull through the vertex without intravenous contrast. COMPARISON:  Brain MRI 03/03/2013 and CT 03/02/2013 FINDINGS: Brain: Mild motion artifact. There is no evidence of acute cortical infarct, intracranial hemorrhage, mass, midline shift, or extra-axial fluid collection. Ventricles and sulci are normal in size for age. Patchy cerebral white matter hypodensities are stable to slightly progressive and nonspecific but compatible with mild chronic small vessel ischemic disease. A small chronic left cerebellar infarct is unchanged. Vascular: Calcified atherosclerosis at the skullbase. Skull: No fracture or focal osseous lesion. Sinuses/Orbits: Prior right cataract extraction.  Clear sinuses. Other: None. ASPECTS [facility] Stroke Program Early CT Score) - Ganglionic level infarction (caudate, lentiform nuclei, internal capsule, insula, M1-M3 cortex): 7 - Supraganglionic infarction (M4-M6 cortex): 3 Total score (0-10 with 10 being normal): 10 IMPRESSION: 1. No evidence of acute intracranial abnormality. 2. ASPECTS is 10. 3. Mild chronic small vessel ischemic disease. Chronic left cerebellar infarct. Electronically Signed: By: Sebastian Ache M.D. On: 11/03/2015 14:48     Assessment/Plan: Diabetic Foot Ulcers, osteomyelitis Acute blood loss anemia PVD L fem-pop 10-5 Acute CVA VDRF  Total days of antibiotics: 6 (day 2 vanco/cefepime)  Afeb, wbc better extubated H/h stable Hopefully out of unit soon and to surgery for foot         Johny Sax Infectious Diseases (pager) [phone] [email or web address] 11/05/2015, 8:25 AM  LOS: 6 days

## 2015-11-05 NOTE — Progress Notes (Signed)
Hypoglycemic Event  CBG: 55  Treatment: 15 GM carbohydrate snack  Symptoms: None  Follow-up CBG: Time:0410 CBG Result: 78 Possible Reasons for Event: Inadequate meal intake  Comments/MD notified: patient was easily arousable,  no abnormal VS, no symptoms, 4 oz of OJ consumed without difficulty, ELink not notified    Hale Bogus, [place]

## 2015-11-05 NOTE — Progress Notes (Signed)
PULMONARY / CRITICAL CARE MEDICINE   Name: [name] [name] MRN: [id number] DOB: [birth year]    ADMISSION DATE:  10/30/2015 CONSULTATION DATE:  11/03/15  REFERRING MD:  Dr Edward Jolly, [facility]  CHIEF COMPLAINT:  Altered MS and shock  HISTORY OF PRESENT ILLNESS:   65 yo woman, hx PVD / PAD, L foot plantar abscess, s/p L fem-pop bypass on 11/02/15. Evaluated by Dr Lajoyce Corners 10/6 and recommended surgical debridement. She was awake and interacting normally as of 12:20pm, then noted to have slurred speech, L sided weakness, altered MS. She had received narcs around 10:00 so was given narcan. She responded with improved MS, wakefulness. A head CT ruled out acute bleed. Through the pm she has had progressive lethargy, garbled speech, poor airway management and now shock. She was started on dopamine and PCCM was urgently consulted to evaluate. Note neurology has been consulted for suspected acute post-op CVA. ABG showed adequate ventilation and oxygenation . Moves to ICU. Received 2 u PRBCs for hgb to 5.8. Intubated evening of 10/6 for concern for ability to protect airway.   SUBJECTIVE:  extubated without incident Had some hypoxemia last night, was treated w IS and lasix empirically  VITAL SIGNS: BP (!) 144/64   Pulse (!) 102   Temp 98.8 F (37.1 C) (Oral)   Resp (!) 22   Ht 5\' 4"  (1.626 m)   Wt 78.4 kg (172 lb 13.5 oz)   SpO2 95%   BMI 29.67 kg/m   HEMODYNAMICS:    VENTILATOR SETTINGS: Vent Mode: PSV;CPAP FiO2 (%):  [40 %-100 %] 55 % PEEP:  [5 cmH20] 5 cmH20 Pressure Support:  [5 cmH20-8 cmH20] 5 cmH20  INTAKE / OUTPUT: I/O last 3 completed shifts: In: 1511.2 [I.V.:421.2; Other:290; IV Piggyback:800] Out: 3396 [Urine:3375; Drains:21]  PHYSICAL EXAMINATION: General:  Ill appearing woman, comfortable in bed on [place] Neuro: Awake, appropriate, interacting, moves all ext HEENT: MMM.  Cardiovascular: RRR, no m/r/g appreciated Lungs: Clear superiorly, decreased w some insp crackles at bases Abdomen:  Soft, NT, + BS Musculoskeletal:  L LE staples CDI, L dorsal foot wound dressed. 2 L LE bulb drains with minimal drainage (both less than 25 cc) Skin: Right leg warmer than left. Could not palpate DP, PT pulses.   LABS:  BMET  Recent Labs Lab 11/03/15 0416 11/03/15 1612 11/05/15 0458  NA 139 137 140  K 4.2 4.3 3.3*  CL 108 109 105  CO2 25 20* 27  BUN 12 14 9   CREATININE 0.62 0.64 0.47  GLUCOSE 81 114* 96    Electrolytes  Recent Labs Lab 11/02/15 2202 11/03/15 0416 11/03/15 1612 11/05/15 0458  CALCIUM  --  8.1* 7.8* 8.1*  MG 1.2*  --   --   --     CBC  Recent Labs Lab 11/04/15 0347 11/04/15 1036 11/04/15 2130  WBC 13.3* 12.8* 12.9*  HGB 8.8* 7.8* 8.1*  HCT 26.1* 23.4* 24.0*  PLT 264 240 274    Coag's  Recent Labs Lab 11/02/15 0330 11/03/15 0416 11/03/15 2143 11/04/15 0347  APTT  --  42* 36 37*  INR 1.22  --  1.22 1.21    Sepsis Markers  Recent Labs Lab 11/03/15 1612 11/03/15 2329  LATICACIDVEN 3.1* 1.6    ABG  Recent Labs Lab 11/03/15 1553 11/03/15 2121  PHART 7.440 7.400  PCO2ART 30.3* 32.3  PO2ART 69.7* 74.0*    Liver Enzymes  Recent Labs Lab 11/03/15 1612 11/05/15 0458  AST 31 27  ALT 13* 12*  ALKPHOS  85 74  BILITOT 0.7 0.4  ALBUMIN 1.6* 1.4*    Cardiac Enzymes  Recent Labs Lab 11/03/15 2223 11/04/15 0347 11/04/15 1036  TROPONINI 1.77* 3.83* 2.32*    Glucose  Recent Labs Lab 11/04/15 1535 11/04/15 1912 11/04/15 2334 11/05/15 0345 11/05/15 0410 11/05/15 0721  GLUCAP 80 93 88 55* 78 111*    Imaging No results found.   STUDIES:  MRI L foot >> Osteomyelitis involving 5th metatarsal and proximal phalanx fifth toe Head Ct / CTA 10/6  >> no acute bleed, chronic near complete occlusion; cellulitis of dorsal surface left foot  DG chest 10/6 > Low lung volumes and interstitial pulmonary edema noted; ETT 15 mm above carina  DG abdomen 10/6 > OG tube in place  CULTURES: Blood 10/6 >>  Urine cx 10/6 >>  negative MRSA screen 10/3 >> negative  ANTIBIOTICS: vanco 10/6 >>  Cefepime 10/6 >> Aztreonam 10/3 > 10/4   SIGNIFICANT EVENTS: 10/5 s/p L fem-pop bypass 10/6 to ICU with acute shock and AMS  LINES/TUBES: ETT 10/6 >> 10/7 R IJ 10/6 >> PIV x 2 >> Anterior groin and distal leg bulb drains x 2 10/5 >>  DISCUSSION: 65 yo woman w PVD, L plantar foot abscess + osteo, s/p L fem-pop abscess 10/5. Was well until 12:00 10/6, developed lethargy, AMS, shock. Responded transiently to narcan. Initial eval has revealed acute anemia, now receiving PRBC. Will cover for possible sepsis, assess for cardiac contribution. Vascular agreed with watching for resolution of suspected bleed into L thigh, following serial CBCs. Patient's hgb has stabilized after 2 u PRBCs. Leukocytosis improving on abx to cover osteomyelitis. Troponins elevated to 3.83 without EKG changes. Extubated 10/6  ASSESSMENT / PLAN:  PULMONARY A: Hypoxemia At risk acute resp failure due to suppressed MS; intubated 10/6 P:   pulm hygiene  Push IS and OOB today CXR to assess atx or pulm edema  CARDIOVASCULAR A:  Shock, suspect hemorrhagic, sepsis due to foot wound, to osteomyelitis, acute MI / cardiogenic - resolved Elevated troponin setting of hypovolemic shock P:  Troponin peak at 3.83 ECHO to look for wall motion abnormality, read pending Pressors off Likely involve cardiology regarding stress-NSTEMI once stabilized. Unclear she is a good candidate for intervention at this time.   RENAL A:   No acute issues, at risk for ARF. SCr stable.  P:   Follow BMP and UOP  GASTROINTESTINAL A:   SUP / Hx GERD Nutrition P:   Advance diet as tolerated protonix qd  HEMATOLOGIC A:   Acute Anemia, suspect bleed into L thigh Hgb stable  P:  S/p 2 U PRBC, no evidence further blood loss Follow CBC [facility]  INFECTIOUS A:   Possible sepsis, septic shock L foot plantar abscess + toe  osteomyelitis P:   Blood cx sent, NGTD Empiric vanco + cefepime 10/6 >> Vascular and Ortho following, probably will need a debridement sgy next week. Dr Lajoyce Cornersuda following  ENDOCRINE A:   DM 2. Hgb A1c 7.6 11/02/15   P:   SSI coverage  NEUROLOGIC A:   Acute altered MS, consider acute CVA although MS and LUE movement have improved  P:   RASS goal: 0 Head CT with no acute bleed Still at some risk that this was CVA, appreciate neuro recs, Would still plan to get MRI brain when stable to do so Restart ASA when stable to do so Minimize sedating meds    FAMILY  - Updates: discussed  status with family 10/6  - Inter-disciplinary family meet or Palliative Care meeting due by:  11/10/15    Levy Pupa, MD, PhD 11/05/2015, 7:46 AM [facility] [phone] or if no answer [phone]

## 2015-11-06 ENCOUNTER — Other Ambulatory Visit (HOSPITAL_COMMUNITY): Payer: Medicare Other

## 2015-11-06 DIAGNOSIS — E785 Hyperlipidemia, unspecified: Secondary | ICD-10-CM

## 2015-11-06 DIAGNOSIS — I248 Other forms of acute ischemic heart disease: Secondary | ICD-10-CM

## 2015-11-06 DIAGNOSIS — Z95828 Presence of other vascular implants and grafts: Secondary | ICD-10-CM

## 2015-11-06 DIAGNOSIS — I639 Cerebral infarction, unspecified: Secondary | ICD-10-CM

## 2015-11-06 DIAGNOSIS — I10 Essential (primary) hypertension: Secondary | ICD-10-CM

## 2015-11-06 DIAGNOSIS — I25708 Atherosclerosis of coronary artery bypass graft(s), unspecified, with other forms of angina pectoris: Secondary | ICD-10-CM

## 2015-11-06 LAB — CREATININE, SERUM
CREATININE: 0.43 mg/dL — AB (ref 0.44–1.00)
GFR calc Af Amer: >60 mL/min (ref >60)

## 2015-11-06 LAB — CBC
HEMATOCRIT: 26.3 % — AB (ref 36.0–46.0)
Hemoglobin: 8.6 g/dL — ABNORMAL LOW (ref 12.0–15.0)
MCH: 29 pg (ref 26.0–34.0)
MCHC: 32.7 g/dL (ref 30.0–36.0)
MCV: 88.6 fL (ref 78.0–100.0)
PLATELETS: 325 10*3/uL (ref 150–400)
RBC: 2.97 MIL/uL — AB (ref 3.87–5.11)
RDW: 15.2 % (ref 11.5–15.5)
WBC: 9.6 10*3/uL (ref 4.0–10.5)

## 2015-11-06 LAB — GLUCOSE, CAPILLARY
GLUCOSE-CAPILLARY: 158 mg/dL — AB (ref 65–99)
Glucose-Capillary: 78 mg/dL (ref 65–99)
Glucose-Capillary: 85 mg/dL (ref 65–99)
Glucose-Capillary: 124 mg/dL — ABNORMAL HIGH (ref 65–99)
Glucose-Capillary: 151 mg/dL — ABNORMAL HIGH (ref 65–99)

## 2015-11-06 LAB — VANCOMYCIN, TROUGH: Vancomycin Tr: 11 ug/mL — ABNORMAL LOW (ref 15–20)

## 2015-11-06 MED ORDER — POTASSIUM CHLORIDE CRYS ER 20 MEQ PO TBCR
40.0000 meq | EXTENDED_RELEASE_TABLET | Freq: Once | ORAL | Status: AC
  Administered 2015-11-06: 40 meq via ORAL
  Filled 2015-11-06: qty 2

## 2015-11-06 MED ORDER — POTASSIUM CHLORIDE 20 MEQ/15ML (10%) PO SOLN: 40.0000 meq | Freq: Once | ORAL | Status: DC

## 2015-11-06 MED ORDER — INSULIN ASPART 100 UNIT/ML ~~LOC~~ SOLN
0.0000 [IU] | Freq: Three times a day (TID) | SUBCUTANEOUS | Status: DC
  Administered 2015-11-06: 2 [IU] via SUBCUTANEOUS
  Administered 2015-11-06: 3 [IU] via SUBCUTANEOUS
  Administered 2015-11-07: 2 [IU] via SUBCUTANEOUS
  Administered 2015-11-07: 3 [IU] via SUBCUTANEOUS
  Administered 2015-11-08 (×2): 2 [IU] via SUBCUTANEOUS
  Administered 2015-11-09: 3 [IU] via SUBCUTANEOUS
  Administered 2015-11-09: 2 [IU] via SUBCUTANEOUS
  Administered 2015-11-09 – 2015-11-11 (×2): 3 [IU] via SUBCUTANEOUS
  Administered 2015-11-11 (×2): 2 [IU] via SUBCUTANEOUS
  Administered 2015-11-12: 3 [IU] via SUBCUTANEOUS
  Administered 2015-11-12: 2 [IU] via SUBCUTANEOUS
  Administered 2015-11-13 (×2): 3 [IU] via SUBCUTANEOUS

## 2015-11-06 MED ORDER — PANTOPRAZOLE SODIUM 40 MG PO TBEC
40.0000 mg | DELAYED_RELEASE_TABLET | Freq: Every day | ORAL | Status: DC
  Administered 2015-11-06 – 2015-11-14 (×9): 40 mg via ORAL
  Filled 2015-11-06 (×8): qty 1

## 2015-11-06 MED ORDER — VANCOMYCIN HCL IN DEXTROSE 1-5 GM/200ML-% IV SOLN
1000.0000 mg | Freq: Two times a day (BID) | INTRAVENOUS | Status: DC
  Administered 2015-11-06 – 2015-11-11 (×11): 1000 mg via INTRAVENOUS
  Filled 2015-11-06 (×15): qty 200

## 2015-11-06 NOTE — Evaluation (Signed)
Physical Therapy Evaluation Patient Details Name: [name] [name] N Cuyler MRN: [id number] DOB: Sep 25, 1950 Today's Date: 11/06/2015   History of Present Illness  Pt adm with lt plantar abscess and underwent revascularization on 10/5. On 10/6 acutely developed slurred speech, L sided weakness, and altered MS. CT negative but neurology consulted and felt it was right brain  Small subcortical infarct not seen on CT. Pt PMH - DM, PVD, PAD, MI, HTN, CAD, arthritis,  Clinical Impression  Pt admitted with above diagnosis and presents to PT with functional limitations due to deficits listed below (See PT problem list). Pt needs skilled PT to maximize independence and safety to allow discharge to ST-SNF. Expect pt's progress is going to be steady but slow and will need time to return to modified independent mobility.     Follow Up Recommendations SNF    Equipment Recommendations  None recommended by PT    Recommendations for Other Services       Precautions / Restrictions Precautions Precautions: Fall Restrictions Weight Bearing Restrictions: No      Mobility  Bed Mobility Overal bed mobility: Needs Assistance Bed Mobility: Supine to Sit     Supine to sit: +2 for physical assistance;Mod assist     General bed mobility comments: Assist to bring legs off bed, elevate trunk, and bring hips to EOB  Transfers Overall transfer level: Needs assistance Equipment used: Rolling walker (2 wheeled);Ambulation equipment used Transfers: Sit to/from [employer] CorporationStand;Stand Pivot Transfers Sit to Stand: +2 physical assistance;Mod assist Stand pivot transfers: +2 physical assistance;Mod assist (with Stedy)       General transfer comment: Assist to bring hips up. Pt standing with forward flexion over walker and unable to make pivotal steps. Returned to sitting and then used [employer]ara Stedy for pivot to chair.  Ambulation/Gait             General Gait Details: Unable to take any steps.  Stairs             Wheelchair Mobility    Modified Rankin (Stroke Patients Only) Modified Rankin (Stroke Patients Only) Pre-Morbid Rankin Score: Moderate disability Modified Rankin: Severe disability     Balance Overall balance assessment: Needs assistance Sitting-balance support: No upper extremity supported;Feet supported Sitting balance-Leahy Scale: Fair     Standing balance support: Bilateral upper extremity supported Standing balance-Leahy Scale: Poor Standing balance comment: walker and +2 mod A for static standing                             Pertinent Vitals/Pain Pain Assessment: Faces Faces Pain Scale: Hurts even more Pain Location: bil lower extremities Pain Descriptors / Indicators: Grimacing;Guarding Pain Intervention(s): Limited activity within patient's tolerance;Monitored during session;Repositioned    Home Living Family/patient expects to be discharged to:: Private residence Living Arrangements: Children Available Help at Discharge: Family;Available PRN/intermittently Type of Home: House Home Access: Ramped entrance;Stairs to enter Entrance Stairs-Rails: Left Entrance Stairs-Number of Steps: 3 Home Layout: One level Home Equipment: Walker - 4 wheels;Walker - 2 wheels;Bedside commode;Shower seat      Prior Function Level of Independence: Needs assistance   Gait / Transfers Assistance Needed: mod I with rollator  ADL's / Homemaking Assistance Needed: Assist with dressing and set up for sponge bathing        Hand Dominance   Dominant Hand: Right    Extremity/Trunk Assessment   Upper Extremity Assessment: Defer to OT evaluation  Lower Extremity Assessment: Generalized weakness;LLE deficits/detail   LLE Deficits / Details: Limited by pain s/p surgery so difficult to determine weakness vs pain     Communication   Communication: No difficulties  Cognition Arousal/Alertness: Awake/alert Behavior During Therapy: WFL for tasks  assessed/performed Overall Cognitive Status: Within Functional Limits for tasks assessed                      General Comments      Exercises     Assessment/Plan    PT Assessment Patient needs continued PT services  PT Problem List Decreased strength;Decreased activity tolerance;Decreased balance;Decreased mobility;Pain          PT Treatment Interventions DME instruction;Gait training;Functional mobility training;Therapeutic activities;Therapeutic exercise;Balance training;Patient/family education    PT Goals (Current goals can be found in the Care Plan section)  Acute Rehab PT Goals Patient Stated Goal: Return home PT Goal Formulation: With patient Time For Goal Achievement: 11/20/15 Potential to Achieve Goals: Good    Frequency Min 3X/week   Barriers to discharge Decreased caregiver support Daughter works so pt home alone at times    Co-evaluation PT/OT/SLP Co-Evaluation/Treatment: Yes Reason for Co-Treatment: For patient/therapist safety PT goals addressed during session: Mobility/safety with mobility         End of Session Equipment Utilized During Treatment: Gait belt Activity Tolerance: Patient tolerated treatment well Patient left: in chair;with call bell/phone within reach;with family/visitor present Nurse Communication: Mobility status;Need for lift equipment         Time: 6073-7106 PT Time Calculation (min) (ACUTE ONLY): 28 min   Charges:   PT Evaluation $PT Eval Moderate Complexity: 1 Procedure     PT G Codes:        Devron Cohick November 07, 2015, 1:24 PM [facility] PT [phone]

## 2015-11-06 NOTE — Evaluation (Signed)
Occupational Therapy Evaluation Patient Details Name: [name] [name] MRN: [id number] DOB: July 20, 1950 Today's Date: 11/06/2015    History of Present Illness Pt adm with lt plantar abscess and underwent revascularization on 10/5. On 10/6 acutely developed slurred speech, L sided weakness, and altered MS. CT negative but neurology consulted and felt it was right brain  Small subcortical infarct not seen on CT. Pt PMH - DM, PVD, PAD, MI, HTN, CAD, arthritis,   Clinical Impression   Pt admitted with above. She demonstrates the below listed deficits and will benefit from continued OT to maximize safety and independence with BADLs.  Pt presents to OT with generalized weakness, decreased activity tolerance, impaired standing balance, and pain.  She reports long standing weakness Lt UE due to fracture with nerve involvement.   Currently, she requires max A for LB ADLs and mod A +2 for functional mobility.  Anticipate she will require SNF level rehab at discharge.       Follow Up Recommendations  SNF    Equipment Recommendations  3 in 1 bedside comode;Tub/shower bench    Recommendations for Other Services       Precautions / Restrictions Precautions Precautions: Fall      Mobility Bed Mobility Overal bed mobility: Needs Assistance Bed Mobility: Supine to Sit     Supine to sit: +2 for physical assistance;Mod assist     General bed mobility comments: Assist to bring legs off bed, elevate trunk, and bring hips to EOB  Transfers Overall transfer level: Needs assistance Equipment used: Rolling walker (2 wheeled);Ambulation equipment used Transfers: Sit to/from [employer] Sit to Stand: +2 physical assistance;Mod assist Stand pivot transfers: +2 physical assistance;Mod assist (with Stedy)       General transfer comment: Assist to bring hips up. Pt standing with forward flexion over walker and unable to make pivotal steps. Returned to sitting and then used [employer]  for pivot to chair.    Balance Overall balance assessment: Needs assistance Sitting-balance support: No upper extremity supported Sitting balance-Leahy Scale: Fair     Standing balance support: Bilateral upper extremity supported Standing balance-Leahy Scale: Poor Standing balance comment: walker and +2 mod A for static standing                            ADL Overall ADL's : Needs assistance/impaired Eating/Feeding: Independent   Grooming: Wash/dry hands;Wash/dry face;Oral care;Brushing hair;Sitting   Upper Body Bathing: Set up;Sitting   Lower Body Bathing: Maximal assistance;Sit to/from stand   Upper Body Dressing : Minimal assistance;Sitting   Lower Body Dressing: Total assistance;Sit to/from stand   Toilet Transfer: Moderate assistance;+2 for physical assistance (stedy )   Toileting- Clothing Manipulation and Hygiene: Total assistance;Sit to/from stand       Functional mobility during ADLs: Moderate assistance;+2 for physical assistance General ADL Comments: Limited by pain and weakness      Vision Additional Comments: Pt reports no visual changes.  She indicates she has blurry vision PTA due to Lt cataract and it has remained unchanged    Perception Perception Perception Tested?: Yes   Praxis Praxis Praxis tested?: Within functional limits    Pertinent Vitals/Pain Pain Assessment: Faces Faces Pain Scale: Hurts even more Pain Location: bil. LEs  Pain Descriptors / Indicators: Grimacing;Guarding Pain Intervention(s): Limited activity within patient's tolerance;Monitored during session;Repositioned     Hand Dominance Right   Extremity/Trunk Assessment Upper Extremity Assessment Upper Extremity Assessment: LUE deficits/detail LUE Deficits /  Details: Pt reports h/o Lt UE fracture with nerve damage and long term weakness.  Strength grossly 4-/5 - 4/5  LUE Sensation: decreased light touch LUE Coordination: decreased fine motor   Lower Extremity  Assessment Lower Extremity Assessment: Defer to PT evaluation LLE Deficits / Details: Limited by pain s/p surgery so difficult to determine weakness vs pain   Cervical / Trunk Assessment Cervical / Trunk Assessment: Normal   Communication Communication Communication: No difficulties   Cognition Arousal/Alertness: Awake/alert Behavior During Therapy: WFL for tasks assessed/performed Overall Cognitive Status: Within Functional Limits for tasks assessed                     General Comments       Exercises       Shoulder Instructions      Home Living Family/patient expects to be discharged to:: Private residence Living Arrangements: Children Available Help at Discharge: Family;Available PRN/intermittently Type of Home: House Home Access: Ramped entrance;Stairs to enter Entrance Stairs-Number of Steps: 3 Entrance Stairs-Rails: Left Home Layout: One level     Bathroom Shower/Tub: Chief Strategy OfficerTub/shower unit   Bathroom Toilet: Standard     Home Equipment: Environmental consultantWalker - 4 wheels;Walker - 2 wheels;Bedside commode;Shower seat          Prior Functioning/Environment Level of Independence: Needs assistance  Gait / Transfers Assistance Needed: mod I with rollator ADL's / Homemaking Assistance Needed: Assist with dressing and set up for sponge bathing            OT Problem List: Decreased strength;Decreased activity tolerance;Impaired balance (sitting and/or standing);Decreased safety awareness;Decreased knowledge of use of DME or AE;Pain   OT Treatment/Interventions: Self-care/ADL training;Therapeutic exercise;DME and/or AE instruction;Therapeutic activities;Patient/family education;Balance training    OT Goals(Current goals can be found in the care plan section) Acute Rehab OT Goals Patient Stated Goal: Return home OT Goal Formulation: With patient Time For Goal Achievement: 11/20/15 Potential to Achieve Goals: Good ADL Goals Pt Will Perform Lower Body Bathing: with mod  assist;sit to/from stand Pt Will Perform Upper Body Dressing: with set-up;sitting Pt Will Perform Lower Body Dressing: with mod assist;sit to/from stand Pt Will Transfer to Toilet: with mod assist;stand pivot transfer;bedside commode Pt Will Perform Toileting - Clothing Manipulation and hygiene: with mod assist;sit to/from stand Pt/caregiver will Perform Home Exercise Program: Left upper extremity;Right Upper extremity;With theraband;With Supervision;With written HEP provided  OT Frequency: Min 2X/week   Barriers to D/C:            Co-evaluation PT/OT/SLP Co-Evaluation/Treatment: Yes Reason for Co-Treatment: Complexity of the patient's impairments (multi-system involvement);For patient/therapist safety   OT goals addressed during session: ADL's and self-care;Strengthening/ROM      End of Session Equipment Utilized During Treatment: Other (comment) (stedy ) Nurse Communication: Mobility status  Activity Tolerance: Patient limited by pain Patient left: in chair;with call bell/phone within reach;with nursing/sitter in room   Time: [id number] OT Time Calculation (min): 28 min Charges:  OT General Charges $OT Visit: 1 Procedure OT Evaluation $OT Eval Moderate Complexity: 1 Procedure G-Codes:    Jaquilla Woodroof M 11/06/2015, 2:54 PM

## 2015-11-06 NOTE — Consult Note (Signed)
Cardiology Consult    Patient ID: [name] [name] MRN: [id number], DOB/AGE: Feb 23, 1950   Admit date: 10/30/2015 Date of Consult: 11/06/2015  Primary Physician: Georgann HousekeeperHUSAIN,KARRAR, MD Reason for Consult: Elevated troponin Primary Cardiologist: Dr. Margo AyeBerry/Dr. Tresa EndoKelly Requesting Provider: Dr. Sharon SellerMcClung  Patient Profile    Ms. [name] [name] is a 65 year old female with a past medical history of HTN, HLD, DM, PAD. Also with history of CAD and STEMI in 2011. She presented to the ED on 10/30/15 [facility] for a left foot infection. Developed septic and hypovolemic shock, found to have elevated troponin so Cardiology was consulted.   History of Present Illness  [name] [name] recently had left common femoral artery endarterectomy and profundoplasty with vein patch angioplasty of the common femoral artery and deep femoral artery on 09/05/15. However, she had disease proximally and therefore subsequently required angioplasty and stenting of disease in her left external iliac artery by Dr. Allyson SabalBerry on 09/28/15.   When she presented, her left foot wound was critical and it was felt that she would require repeat left femoral popliteal artery bypass to avoid amputation. This was done on 11/02/15. Post surgery on 11/03/15 she was obtunded and had left arm and leg weakness. Also she was profoundly anemic with Hgb of 5.8, the day prior her Hgb was 8.2.  Symptoms were worrisome for acute CVA. Patient was transferred to the ICU. Head CT showed no acute bleed. Neuro felt that her symptoms were consistent with right brain TIA with known chronic right ICA occlusion.   Her respiratory status declined and she required intubation later that day on 11/03/15, and was extubated on 11/04/15.  Her troponin was elevated at 0.16, subsequent troponin was 1.77, then 3.83, last was 2.32. A Echo was ordered to assess for wall motion abnormality. Echo still pending at the time of my encounter.   Ms. [name] [name]  had a NSTEMI in Sept. 2011. Cath report below. She had a DES placed to her circumflex, had some mild to moderate disease in her LAD with 30% proximal stenosis. Her EKG showed NSR with some ST flattening in the anterior leads. Ms. [name] [name] tells me that at the time of her STEMI in 2011, she had diffuse chest pressure, jaw and teeth pain and was nauseous. She denies having any of these symptoms this admission, however she was intubated at the time elevated troponins resulted.    Past Medical History   Past Medical History:  Diagnosis Date  . Arthritis    "feel like I have it all over" (08/28/2015)  . Complication of anesthesia    DIFFICULT WAKING "only when I was smoking; no problems since I quit"  . Coronary artery disease   . Family history of adverse reaction to anesthesia    sister slow to wake up  . GERD (gastroesophageal reflux disease)    takes Protonix daily   . Hip bursitis   . Hyperlipidemia LDL goal < 70 06/28/2013   takes Atorvastatin daily  . Hypertension    takes Metoprolol and Imdur daily  . Migraine    "none in a long time" (08/28/2015)  . Myocardial infarction 2011  . Neuromuscular disorder (HCC)    DIABETIC NEUROPATHY  . PAD (peripheral artery disease) (HCC)   . Peripheral vascular disease (HCC)   . Type II diabetes mellitus (HCC)    takes Lantus nightly.Average fasting blood sugar runs 80-90    Past Surgical History:  Procedure Laterality Date  .  ABDOMINAL HYSTERECTOMY    . APPENDECTOMY    . ATHERECTOMY N/A 06/04/2011   Procedure: ATHERECTOMY;  Surgeon: Runell Gess, MD;  Location[facility] CATH LAB;  Service: Cardiovascular;  Laterality: N/A;  . CARDIAC CATHETERIZATION  10/13/2009   95% stenosis in the AV groove circumflex and 95% ostial stenosis in small OM3. A 3x61mm drug-eluting Promus stent inserted ito the circumflex. Dilatated with a 3.25x51mm noncompliant Quantum balloon within entire segment. The entire region was reduced to 0% and brisk TIMI3 flow.  . CAROTID  DUPLEX  03/19/2011   Right ICA-demonstrates complete occlusion. Left ICA-demonstrates a small amount of fibrous plaque.  Marland Kitchen CATARACT EXTRACTION W/ INTRAOCULAR LENS IMPLANT Right   . CESAREAN SECTION  1990  . CORONARY ANGIOPLASTY    . ENDARTERECTOMY FEMORAL Left 09/05/2015   Procedure: ENDARTERECTOMY FEMORAL WITH PROFUNDOPLASTY;  Surgeon: Chuck Hint, MD;  Location: [facility] OR;  Service: Vascular;  Laterality: Left;  Left common femoral artery vein patch using left saphenous vien  . FEMORAL-POPLITEAL BYPASS GRAFT Left 11/02/2015   Procedure: BYPASS GRAFT FEMORAL-POPLITEAL ARTERY VS FEMORAL-TIBIAL ARTERY BYPASS;  Surgeon: Chuck Hint, MD;  Location: [facility] OR;  Service: Vascular;  Laterality: Left;  . ILIAC ARTERY STENT Left 08/28/2015   common  . INTRAOPERATIVE ARTERIOGRAM Left 09/05/2015   Procedure: INTRA OPERATIVE ARTERIOGRAM;  Surgeon: Chuck Hint, MD;  Location: [facility] OR;  Service: Vascular;  Laterality: Left;  . INTRAOPERATIVE ARTERIOGRAM Left 11/02/2015   Procedure: INTRA OPERATIVE ARTERIOGRAM;  Surgeon: Chuck Hint, MD;  Location: [facility] OR;  Service: Vascular;  Laterality: Left;  . LEXISCAN MYOVIEW  10/25/2010   Moderate perfusion defect due to infarct/scar with mild perinfarct ischemia seen in the Basal Inferolateral, Basal Anterolateral, Mid Inferolateral, and Mid Anterolateral regions. Post-stress EF is 50%.  . OVARY SURGERY  1983?   "ruptured"  . PERIPHERAL VASCULAR ANGIOGRAM  01/26/2010   High-grade SFA disease: left greater than right. Left SFA would require fem-pop bypass grafting. Right SFA could be stented but might require Diamondback Orbital atherectomy.  Marland Kitchen PERIPHERAL VASCULAR ANGIOGRAM  02/23/2010   Stealth Predator orbital rotational atherectomy performed on SFA & Popliteal up to 90,000 RPM. Stenting using overlapping 5x177mm and 5x71mm Absolute Pro Nitinol self-expanding stents beginning just at the knee up to the mid SFA resulting in reduction of 90-95%  calcified SFA & Popliteal stenosis to 0. Stenting performed on the distal common & proximal iliac artery with a 10x4 Absolute Pro- 70-0%.  Marland Kitchen PERIPHERAL VASCULAR ANGIOGRAM  06/17/2010   PTA performed to the right external iliac artery stent using a 5x100 balloon at 10 atmospheres. Stenting performed using a 6x18 Genesis on Opta balloon. Postdilatation with a 7x2 balloon resulting in a 95% "in-stent" stenosis to 0% residual.  . PERIPHERAL VASCULAR ANGIOGRAM  06/04/2011   Bilateral total SFAs not percutaneously addressable. Good canidate for femoropopliteal bypass grafting  . PERIPHERAL VASCULAR ANGIOGRAM  08/28/2015  . PERIPHERAL VASCULAR CATHETERIZATION N/A 08/28/2015   Procedure: Lower Extremity Angiography;  Surgeon: Runell Gess, MD;  Location: [facility] INVASIVE CV LAB;  Service: Cardiovascular;  Laterality: N/A;  . PERIPHERAL VASCULAR CATHETERIZATION N/A 08/28/2015   Procedure: Abdominal Aortogram;  Surgeon: Runell Gess, MD;  Location: [facility] INVASIVE CV LAB;  Service: Cardiovascular;  Laterality: N/A;  . PERIPHERAL VASCULAR CATHETERIZATION Left 08/28/2015   Procedure: Peripheral Vascular Intervention;  Surgeon: Runell Gess, MD;  Location: [facility] INVASIVE CV LAB;  Service: Cardiovascular;  Laterality: Left;  common iliac  . PERIPHERAL VASCULAR CATHETERIZATION Left 08/28/2015  Procedure: Peripheral Vascular Atherectomy;  Surgeon: Runell Gess, MD;  Location: [facility] INVASIVE CV LAB;  Service: Cardiovascular;  Laterality: Left;  common iliac  . PERIPHERAL VASCULAR CATHETERIZATION N/A 09/28/2015   Procedure: Lower Extremity Angiography;  Surgeon: Runell Gess, MD;  Location: [facility] INVASIVE CV LAB;  Service: Cardiovascular;  Laterality: N/A;  . PERIPHERAL VASCULAR CATHETERIZATION Left 09/28/2015   Procedure: Peripheral Vascular Intervention;  Surgeon: Runell Gess, MD;  Location: [facility] INVASIVE CV LAB;  Service: Cardiovascular;  Laterality: Left CFA  PCI with 9 mm x 4 cm Abbott nitinol absolute Pro  self-expanding stent     . TRANSTHORACIC ECHOCARDIOGRAM  10/17/2009   EF 45-50%, moderate hypokinesis of the entire inferolateral myocardium, mild concentric hypertrophy and mild regurg of the mitral valva.  Marland Kitchen VEIN HARVEST Left 11/02/2015   Procedure: LEFT GREATER SAPHENOUS VEIN HARVEST;  Surgeon: Chuck Hint, MD;  Location: [facility] OR;  Service: Vascular;  Laterality: Left;     Allergies  Allergies  Allergen Reactions  . Hydrochlorothiazide Other (See Comments)    Unknown  . Latex Rash  . Penicillins Swelling and Rash    Pt states she has tolerated Keflex in the past without problems. States she may have tolerated Augmentin in the past but it caused GI upset.    Inpatient Medications    . aspirin EC  81 mg Oral Daily  . atorvastatin  80 mg Oral Daily  . ceFEPime (MAXIPIME) IV  2 g Intravenous Q12H  . clopidogrel  75 mg Oral Daily  . collagenase   Topical Daily  . gabapentin  300 mg Oral TID  . insulin aspart  0-15 Units Subcutaneous TID WC  . insulin glargine  35 Units Subcutaneous Q2200  . pantoprazole  40 mg Oral Q1200  . polyethylene glycol  17 g Oral Daily  . potassium chloride  40 mEq Oral Once  . senna-docusate  1 tablet Oral QHS  . vancomycin  1,000 mg Intravenous Q12H    Family History    Family History  Problem Relation Age of Onset  . Hypertension Mother   . Heart failure Mother   . Heart failure Father   . Stroke Father   . Diabetes Father     Social History    Social History   Social History  . Marital status: Married    Spouse name: N/A  . Number of children: N/A  . Years of education: N/A   Occupational History  . retired    Social History Main Topics  . Smoking status: Former Smoker    Packs/day: 1.50    Years: 41.00    Types: Cigarettes    Quit date: 10/12/2009  . Smokeless tobacco: Never Used  . Alcohol use No  . Drug use: No  . Sexual activity: Not Currently    Birth control/ protection: Surgical   Other Topics Concern  .  Not on file   Social History Narrative  . No narrative on file     Review of Systems    General:  No chills, fever, night sweats or weight changes.  Cardiovascular:  No chest pain, dyspnea on exertion, edema, orthopnea, palpitations, paroxysmal nocturnal dyspnea. Dermatological: No rash, lesions/masses Respiratory: No cough, dyspnea Urologic: No hematuria, dysuria Abdominal:   No nausea, vomiting, diarrhea, bright red blood per rectum, melena, or hematemesis Neurologic:  No visual changes, wkns, changes in mental status. All other systems reviewed and are otherwise negative except as noted above.  Physical Exam  Blood pressure (!) 145/67, pulse (!) 105, temperature 98.3 F (36.8 C), temperature source Oral, resp. rate 20, height 5\' 4"  (1.626 m), weight 172 lb 13.5 oz (78.4 kg), SpO2 99 %.  General: Pleasant, NAD Psych: Normal affect. Neuro: Alert and oriented X 3. Moves all extremities spontaneously. HEENT: Normal  Neck: Supple without bruits or JVD. Lungs:  Resp regular and unlabored, CTA. Heart: RRR no s3, s4, or murmurs. Abdomen: Soft, non-tender, non-distended, BS + x 4.  Extremities: No clubbing, cyanosis or edema. DP/PT/Radials 2+ and equal bilaterally.  Labs     Recent Labs  11/03/15 1612 11/03/15 2223 11/04/15 0347 11/04/15 1036  CKTOTAL 604*  --   --   --   TROPONINI 0.14* 1.77* 3.83* 2.32*   Lab Results  Component Value Date   WBC 10.8 (H) 11/05/2015   HGB 7.8 (L) 11/05/2015   HCT 24.3 (L) 11/05/2015   MCV 88.0 11/05/2015   PLT 276 11/05/2015    Recent Labs Lab 11/05/15 0458 11/06/15 0600  NA 140  --   K 3.3*  --   CL 105  --   CO2 27  --   BUN 9  --   CREATININE 0.47 0.43*  CALCIUM 8.1*  --   PROT 5.0*  --   BILITOT 0.4  --   ALKPHOS 74  --   ALT 12*  --   AST 27  --   GLUCOSE 96  --    Lab Results  Component Value Date   CHOL 91 11/05/2015   HDL 25 (L) 11/05/2015   LDLCALC 44 11/05/2015   TRIG 109 11/05/2015   No results found  for: [facility]   Radiology Studies    Ct Angio Head W Or Wo Contrast  Result Date: 11/03/2015 CLINICAL DATA:  Left-sided weakness and aphasia. EXAM: CT ANGIOGRAPHY HEAD AND NECK CT CEREBRAL PERFUSION WITH CONTRAST TECHNIQUE: Multidetector CT imaging of the head and neck was performed using the standard protocol during bolus administration of intravenous contrast. Multiplanar CT image reconstructions and MIPs were obtained to evaluate the vascular anatomy. Carotid stenosis measurements (when applicable) are obtained utilizing NASCET criteria, using the distal internal carotid diameter as the denominator. CT cerebral perfusion imaging was also performed during the dynamic injection of intravenous contrast. Post processing was performed. CONTRAST:  90 mL Isovue 370 COMPARISON:  Head MRI/ MRA 09/20/2013 FINDINGS: CTA NECK FINDINGS Aortic arch: Three vessel aortic arch with extensive atherosclerotic calcified plaque. There is approximately 60% stenosis of the proximal brachiocephalic artery and proximal right subclavian artery. Atherosclerotic plaque in a focal when or intimal flap in the proximal left subclavian artery contribute to 65% stenosis. Right carotid system: Common carotid artery is patent without stenosis. Internal carotid artery is occluded at its origin without reconstitution in the neck. There is mild narrowing of the ECA origin. Left carotid system: Moderate atherosclerotic plaque at the carotid bifurcation without significant common or internal carotid artery stenosis. Mild proximal ECA stenosis. Vertebral arteries: Vertebral arteries are patent and codominant. There is mild right V2 narrowing at the C2 and C5 levels due to calcified plaque. There is mild left V2 narrowing due to degenerative vertebral spurring at C5-6 and C6-7. Skeleton: Moderate to advanced disc degeneration from C4-5 to C6-7. Other neck: 8 mm calculus in the distal left submandibular duct without significant ductal dilatation or  inflammatory change. Small left thyroid nodule. Upper chest: Interlobular septal thickening in the lung apices with partially visualized ground-glass opacity posteriorly in the left upper lobe  and superior segment segments of the left greater than right upper lobes. Review of the MIP images confirms the above findings CTA HEAD FINDINGS Anterior circulation: Chronically occluded right ICA with reconstitution of the terminus via a small but patent right A1 segment, unchanged. Right M1 segment is widely patent. There is at most mild narrowing of the proximal right M2 inferior division. Decreased number of distal right MCA branch vessels in the right parietal region is chronic. Intracranial left ICA is patent with extensive atherosclerosis though only resulting in at most mild stenosis. 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 Left A1 segment is widely patent, as is the anterior communicating artery. A2 segments are patent. Left MCA is patent without evidence of major branch occlusion or significant proximal stenosis. Posterior circulation: Intracranial vertebral arteries are patent to the basilar with prominent atherosclerosis, greater on the right. There is severe proximal right V4 stenosis with additional moderate tandem stenoses. PICA origins appear patent, potentially with a high-grade stenosis on the right. AICA and SCA origins are patent. Basilar artery is patent without stenosis. Posterior communicating arteries are not identified. PCAs are patent with mild distal left P1 narrowing. No aneurysm. Venous sinuses: Patent. Anatomic variants: Small right A1. Review of the MIP images confirms the above findings CT CEREBRAL PERFUSION FINDINGS There is mildly prolonged transit time throughout most of the right MCA territory, with slightly greater delayed transit (6 seconds greater than the contralateral  side) in the deep watershed territory/centrum semiovale. Cerebral blood flow is preserved. Cerebral blood volume is also normal to mildly increased. No diminished CBF/CBV suggestive of acute infarction is seen. IMPRESSION: 1. 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 2. Prolonged transit time throughout the right MCA territory due to #1 with compensated cerebral blood flow parameters. No evidence of acute infarct. 3. Chronically decreased number of distal right MCA branch vessels. No evidence of acute major branch occlusion. 4. Severe right V4 vertebral artery stenosis. 5. 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 6. Aortic atherosclerosis. 7. 8 mm left submandibular duct calculus. 8. Interlobular septal thickening and ground-glass opacities in lung apices, possibly reflecting edema. Correlate with chest radiography. These results were called by telephone at the time of interpretation on 11/03/2015 at 4:00 pm to Dr. Desmond Lope, who verbally acknowledged these results. Electronically Signed   By: Sebastian Ache M.D.   On: 11/03/2015 16:31   Ct Angio Neck W Or Wo Contrast  Result Date: 11/03/2015 CLINICAL DATA:  Left-sided weakness and aphasia. EXAM: CT ANGIOGRAPHY HEAD AND NECK CT CEREBRAL PERFUSION WITH CONTRAST TECHNIQUE: Multidetector CT imaging of the head and neck was performed using the standard protocol during bolus administration of intravenous contrast. Multiplanar CT image reconstructions and MIPs were obtained to evaluate the vascular anatomy. Carotid stenosis measurements (when applicable) are obtained utilizing NASCET criteria, using the distal internal carotid diameter as the denominator. CT cerebral perfusion imaging was also performed during the dynamic injection of intravenous contrast. Post processing was performed. CONTRAST:  90 mL Isovue 370 COMPARISON:  Head MRI/ MRA 09/20/2013 FINDINGS: CTA NECK FINDINGS Aortic arch: Three vessel  aortic arch with extensive atherosclerotic calcified plaque. There is approximately 60% stenosis of the proximal brachiocephalic artery and proximal right subclavian artery. Atherosclerotic plaque in a focal when or intimal flap in the proximal left subclavian artery contribute to 65% stenosis. Right carotid system: Common carotid artery is patent without stenosis.  Internal carotid artery is occluded at its origin without reconstitution in the neck. There is mild narrowing of the ECA origin. Left carotid system: Moderate atherosclerotic plaque at the carotid bifurcation without significant common or internal carotid artery stenosis. Mild proximal ECA stenosis. Vertebral arteries: Vertebral arteries are patent and codominant. There is mild right V2 narrowing at the C2 and C5 levels due to calcified plaque. There is mild left V2 narrowing due to degenerative vertebral spurring at C5-6 and C6-7. Skeleton: Moderate to advanced disc degeneration from C4-5 to C6-7. Other neck: 8 mm calculus in the distal left submandibular duct without significant ductal dilatation or inflammatory change. Small left thyroid nodule. Upper chest: Interlobular septal thickening in the lung apices with partially visualized ground-glass opacity posteriorly in the left upper lobe and superior segment segments of the left greater than right upper lobes. Review of the MIP images confirms the above findings CTA HEAD FINDINGS Anterior circulation: Chronically occluded right ICA with reconstitution of the terminus via a small but patent right A1 segment, unchanged. Right M1 segment is widely patent. There is at most mild narrowing of the proximal right M2 inferior division. Decreased number of distal right MCA branch vessels in the right parietal region is chronic. Intracranial left ICA is patent with extensive atherosclerosis though only resulting in at most mild stenosis. 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 Left A1 segment is widely patent, as is the anterior communicating artery. A2 segments are patent. Left MCA is patent without evidence of major branch occlusion or significant proximal stenosis. Posterior circulation: Intracranial vertebral arteries are patent to the basilar with prominent atherosclerosis, greater on the right. There is severe proximal right V4 stenosis with additional moderate tandem stenoses. PICA origins appear patent, potentially with a high-grade stenosis on the right. AICA and SCA origins are patent. Basilar artery is patent without stenosis. Posterior communicating arteries are not identified. PCAs are patent with mild distal left P1 narrowing. No aneurysm. Venous sinuses: Patent. Anatomic variants: Small right A1. Review of the MIP images confirms the above findings CT CEREBRAL PERFUSION FINDINGS There is mildly prolonged transit time throughout most of the right MCA territory, with slightly greater delayed transit (6 seconds greater than the contralateral side) in the deep watershed territory/centrum semiovale. Cerebral blood flow is preserved. Cerebral blood volume is also normal to mildly increased. No diminished CBF/CBV suggestive of acute infarction is seen. IMPRESSION: 1. 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 2. Prolonged transit time throughout the right MCA territory due to #1 with compensated cerebral blood flow parameters. No evidence of acute infarct. 3. Chronically decreased number of distal right MCA branch vessels. No evidence of acute major branch occlusion. 4. Severe right V4 vertebral artery stenosis. 5. 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 6. Aortic atherosclerosis. 7. 8 mm left submandibular duct calculus. 8. Interlobular septal thickening and ground-glass opacities in lung apices, possibly reflecting edema.  Correlate with chest radiography. These results were called by telephone at the time of interpretation on 11/03/2015 at 4:00 pm to Dr. Desmond Lope, who verbally acknowledged these results. Electronically Signed   By: Sebastian Ache M.D.   On: 11/03/2015 16:31   Mr Foot Left W Wo Contrast  Result Date: 11/02/2015 CLINICAL DATA:  Diabetic foot ulcer, assessment for osteomyelitis. EXAM: MRI OF THE LEFT FOREFOOT WITHOUT AND WITH CONTRAST TECHNIQUE: Multiplanar, multisequence MR imaging was performed both  before and after administration of intravenous contrast. CONTRAST:  15mL MULTIHANCE GADOBENATE DIMEGLUMINE 529 MG/ML IV SOLN COMPARISON:  10/30/2015 FINDINGS: Osteomyelitis protocol MRI of the foot was obtained, to include the entire foot and ankle. This protocol uses a large field of view to cover the entire foot and ankle, and is suitable for assessing bony structures for osteomyelitis. Due to the large field of view and imaging plane choice, this protocol is less sensitive for assessing small structures such as ligamentous structures of the foot and ankle, compared to a dedicated forefoot or dedicated hindfoot exam. Despite efforts by the technologist and patient, motion artifact is present on today's exam and could not be eliminated. This reduces exam sensitivity and specificity. Abnormal edema and enhancement involving the distal head and metaphysis of the fifth metatarsal and adjacent proximal half of the small toe proximal phalanx compatible with osteomyelitis. Lateral ulceration along the fifth MTP joint probably communicates with the joint on image 33/6. Extensive subcutaneous edema along the plantar foot at the level of the midfoot and forefoot, although I do not see a drainable abscess this may well be spontaneously draining to the plantar foot given the overlying skin irregularity. There is edema and likely some enhancement along the plantar foot musculature. Thickened proximal plantar fascia. Dorsal subcutaneous  edema in the foot. IMPRESSION: 1. Ulceration lateral to the fifth MTP joint appears to extend all the way down into the joint. There is osteomyelitis distally in the fifth metatarsal and also in the proximal phalanx of the fifth toe. No other osteomyelitis in the foot is identified. 2. Suspected ulcerations along the plantar foot with confluent phlegmon tracking in the plantar foot, primarily within the subcutaneous tissues but also along the deep margin of the medial band of the plantar fascia. I suspected any early abscess in this region is spontaneously draining. 3. Cellulitis along the dorsum of the foot. Electronically Signed   By: Gaylyn Rong M.D.   On: 11/02/2015 07:31   Ct Cerebral Perfusion W Contrast  Result Date: 11/03/2015 CLINICAL DATA:  Left-sided weakness and aphasia. EXAM: CT ANGIOGRAPHY HEAD AND NECK CT CEREBRAL PERFUSION WITH CONTRAST TECHNIQUE: Multidetector CT imaging of the head and neck was performed using the standard protocol during bolus administration of intravenous contrast. Multiplanar CT image reconstructions and MIPs were obtained to evaluate the vascular anatomy. Carotid stenosis measurements (when applicable) are obtained utilizing NASCET criteria, using the distal internal carotid diameter as the denominator. CT cerebral perfusion imaging was also performed during the dynamic injection of intravenous contrast. Post processing was performed. CONTRAST:  90 mL Isovue 370 COMPARISON:  Head MRI/ MRA 09/20/2013 FINDINGS: CTA NECK FINDINGS Aortic arch: Three vessel aortic arch with extensive atherosclerotic calcified plaque. There is approximately 60% stenosis of the proximal brachiocephalic artery and proximal right subclavian artery. Atherosclerotic plaque in a focal when or intimal flap in the proximal left subclavian artery contribute to 65% stenosis. Right carotid system: Common carotid artery is patent without stenosis. Internal carotid artery is occluded at its origin  without reconstitution in the neck. There is mild narrowing of the ECA origin. Left carotid system: Moderate atherosclerotic plaque at the carotid bifurcation without significant common or internal carotid artery stenosis. Mild proximal ECA stenosis. Vertebral arteries: Vertebral arteries are patent and codominant. There is mild right V2 narrowing at the C2 and C5 levels due to calcified plaque. There is mild left V2 narrowing due to degenerative vertebral spurring at C5-6 and C6-7. Skeleton: Moderate to advanced disc degeneration from C4-5  to C6-7. Other neck: 8 mm calculus in the distal left submandibular duct without significant ductal dilatation or inflammatory change. Small left thyroid nodule. Upper chest: Interlobular septal thickening in the lung apices with partially visualized ground-glass opacity posteriorly in the left upper lobe and superior segment segments of the left greater than right upper lobes. Review of the MIP images confirms the above findings CTA HEAD FINDINGS Anterior circulation: Chronically occluded right ICA with reconstitution of the terminus via a small but patent right A1 segment, unchanged. Right M1 segment is widely patent. There is at most mild narrowing of the proximal right M2 inferior division. Decreased number of distal right MCA branch vessels in the right parietal region is chronic. Intracranial left ICA is patent with extensive atherosclerosis though only resulting in at most mild stenosis. 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 Left A1 segment is widely patent, as is the anterior communicating artery. A2 segments are patent. Left MCA is patent without evidence of major branch occlusion or significant proximal stenosis. Posterior circulation: Intracranial vertebral arteries are patent to the basilar with prominent atherosclerosis, greater on the right.  There is severe proximal right V4 stenosis with additional moderate tandem stenoses. PICA origins appear patent, potentially with a high-grade stenosis on the right. AICA and SCA origins are patent. Basilar artery is patent without stenosis. Posterior communicating arteries are not identified. PCAs are patent with mild distal left P1 narrowing. No aneurysm. Venous sinuses: Patent. Anatomic variants: Small right A1. Review of the MIP images confirms the above findings CT CEREBRAL PERFUSION FINDINGS There is mildly prolonged transit time throughout most of the right MCA territory, with slightly greater delayed transit (6 seconds greater than the contralateral side) in the deep watershed territory/centrum semiovale. Cerebral blood flow is preserved. Cerebral blood volume is also normal to mildly increased. No diminished CBF/CBV suggestive of acute infarction is seen. IMPRESSION: 1. 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 2. Prolonged transit time throughout the right MCA territory due to #1 with compensated cerebral blood flow parameters. No evidence of acute infarct. 3. Chronically decreased number of distal right MCA branch vessels. No evidence of acute major branch occlusion. 4. Severe right V4 vertebral artery stenosis. 5. 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 6. Aortic atherosclerosis. 7. 8 mm left submandibular duct calculus. 8. Interlobular septal thickening and ground-glass opacities in lung apices, possibly reflecting edema. Correlate with chest radiography. These results were called by telephone at the time of interpretation on 11/03/2015 at 4:00 pm to Dr. Desmond Lope, who verbally acknowledged these results. Electronically Signed   By: Sebastian Ache M.D.   On: 11/03/2015 16:31   Dg Chest Port 1 View  Result Date: 11/05/2015 CLINICAL DATA:  Hypoxia EXAM: PORTABLE CHEST 1 VIEW COMPARISON:  November 03, 2015 FINDINGS: Endotracheal tube and  nasogastric tube have been removed. Central catheter tip is in the superior vena cava. No pneumothorax. There is a focal area of consolidation in the right upper lobe medially. There is right base atelectasis. Lungs elsewhere clear. Heart is upper normal in size with pulmonary vascularity within normal limits. There is atherosclerotic calcification in the aorta. No adenopathy. IMPRESSION: Small area of airspace consolidation in the right upper lobe medially, likely infiltrate. Mild right base atelectasis. Lungs elsewhere clear. Stable cardiac silhouette. Aortic atherosclerosis. No pneumothorax. Electronically Signed   By: Bretta Bang III M.D.   On: 11/05/2015  10:56   Dg Chest Port 1 View  Result Date: 11/03/2015 CLINICAL DATA:  Right central venous line placement EXAM: PORTABLE CHEST 1 VIEW COMPARISON:  08/18/2015 chest radiograph. FINDINGS: Right internal jugular central venous catheter terminates in the upper third of the superior vena cava. Stable cardiomediastinal silhouette with normal heart size with aortic atherosclerosis. No pneumothorax. No pleural effusion. Mild diffuse prominence of the central interstitial markings. No acute consolidative airspace disease. IMPRESSION: 1. Right internal jugular central venous catheter terminates in the upper third of the superior vena cava. No pneumothorax. 2. Top-normal heart size. Prominence of the central interstitial markings, favor vascular crowding due to low lung volumes . 3. Aortic atherosclerosis. Electronically Signed   By: Delbert Phenix M.D.   On: 11/03/2015 17:31   Dg Chest Port 1v Same Day  Result Date: 11/03/2015 CLINICAL DATA:  Post intubation. EXAM: PORTABLE CHEST 1 VIEW COMPARISON:  11/03/2015 FINDINGS: Patient is status post intubation. Endotracheal tube overlies the tracheal air column and terminates 15 mm above the carina. Retraction with approximately 1-2 cm may be considered. Enteric catheter has been placed, tip collimated off the  image. Right IJ approach central venous catheter is stable. Cardiomediastinal silhouette is normal. Mediastinal contours appear intact. Calcific atherosclerotic disease of the aorta seen. There is no evidence of pneumothorax. Low lung volumes with interstitial pulmonary edema. Osseous structures are without acute abnormality. Soft tissues are grossly normal. IMPRESSION: Status post intubation. Slight retraction of the endotracheal tube may be considered. Interstitial pulmonary edema. Electronically Signed   By: Ted Mcalpine M.D.   On: 11/03/2015 19:17   Dg Ang/ext/uni/or Left  Result Date: 11/02/2015 CLINICAL DATA:  66 year old female undergoing distal bypass EXAM: LEFT ANG/EXT/UNI/ OR COMPARISON:  Prior intraoperative angiographic images 8 09/17/2015 FINDINGS: Single cross-table lateral intraoperative image demonstrates what appears to be a patent saphenous vein bypass graft attaching to the distal popliteal artery below the knee joint. The popliteal artery is diffusely diseased. Primary runoff to the ankle is via the peroneal artery. The anterior tibial artery is occluded proximally but reconstitutes distally. The posterior tibial artery is occluded. IMPRESSION: Intraoperative arteriogram as above. Electronically Signed   By: Malachy Moan M.D.   On: 11/02/2015 14:26   Dg Abd Portable 1v  Result Date: 11/03/2015 CLINICAL DATA:  Orogastric tube placement. EXAM: PORTABLE ABDOMEN - 1 VIEW COMPARISON:  None. FINDINGS: The bowel gas pattern is normal. Enteric catheter is seen overlying the expected location of gastric body. Bilateral iliac vascular stents are seen. Soft tissue details are obscured by a motion artifact. IMPRESSION: Nonobstructive bowel gas pattern. Enteric catheter overlies the expected location of gastric body. Electronically Signed   By: Ted Mcalpine M.D.   On: 11/03/2015 19:20   Dg Foot Complete Left  Result Date: 10/30/2015 CLINICAL DATA:  Dorsal foot pain. Skin lesion  with possible wound infection. EXAM: LEFT FOOT - COMPLETE 3+ VIEW COMPARISON:  09/19/2015 FINDINGS: There is definite destructive osteomyelitis of the fifth toe affecting the distal metatarsal an the proximal phalanx. There is probable osteomyelitis of the fourth toe affecting the distal metatarsal an the proximal phalanx. There is questionable osteomyelitis of the proximal phalanx of the third toe. IMPRESSION: Osteomyelitis of the forefoot, definite affecting the fifth toe, probable affecting the fourth toe and possible affecting the third toe. See above. Electronically Signed   By: Paulina Fusi M.D.   On: 10/30/2015 20:05   Ct Head Code Stroke Wo Contrast  Addendum Date: 11/03/2015   ADDENDUM REPORT: 11/03/2015 15:12 ADDENDUM:  These results were called by telephone at the time of interpretation on 11/03/2015 at 3:05 pm to Dr. Desmond Lope, who verbally acknowledged these results. Electronically Signed   By: Sebastian Ache M.D.   On: 11/03/2015 15:12   Result Date: 11/03/2015 CLINICAL DATA:  Code stroke. Slurred speech with left-sided deficits. EXAM: CT HEAD WITHOUT CONTRAST TECHNIQUE: Contiguous axial images were obtained from the base of the skull through the vertex without intravenous contrast. COMPARISON:  Brain MRI 03/03/2013 and CT 03/02/2013 FINDINGS: Brain: Mild motion artifact. There is no evidence of acute cortical infarct, intracranial hemorrhage, mass, midline shift, or extra-axial fluid collection. Ventricles and sulci are normal in size for age. Patchy cerebral white matter hypodensities are stable to slightly progressive and nonspecific but compatible with mild chronic small vessel ischemic disease. A small chronic left cerebellar infarct is unchanged. Vascular: Calcified atherosclerosis at the skullbase. Skull: No fracture or focal osseous lesion. Sinuses/Orbits: Prior right cataract extraction.  Clear sinuses. Other: None. ASPECTS [facility] Stroke Program Early CT Score) - Ganglionic level infarction  (caudate, lentiform nuclei, internal capsule, insula, M1-M3 cortex): 7 - Supraganglionic infarction (M4-M6 cortex): 3 Total score (0-10 with 10 being normal): 10 IMPRESSION: 1. No evidence of acute intracranial abnormality. 2. ASPECTS is 10. 3. Mild chronic small vessel ischemic disease. Chronic left cerebellar infarct. Electronically Signed: By: Sebastian Ache M.D. On: 11/03/2015 14:48    EKG & Cardiac Imaging    EKG: NSR, nonspecific ST flattening in anterior leads.   Echocardiogram: pending.   Assessment & Plan    1. Elevated troponin with history of CAD: Troponin elevated in the setting of hypovolemia however, patient has a history of STEMI in 2011 and multiple risk factors for CAD. Unable to assess accurately if she experienced any angina as she was intubated. She denies chest pain currently.  Will follow Echo to assess for wall motion abnormality. I think she would also benefit from nuclear perfusion study as well. MD to advise on timing. Continue ASA and Plavix, which she was on outpatient as well.    2. History of PAD s/p fem-pop: Stable, management by Vascular surgery.   3. HTN: Would add back beta blocker, hypotension has resolved.   4. HLD: Continue high intensity statin. LDL is 44.   5. Osteomyelitis of left foot: Dr. Lajoyce Corners following and ID.   Signed, Little Ishikawa, NP 11/06/2015, 11:26 AM Pager: [phone]   I have seen, examined and evaluated the patient this PM along with Suzzette Righter, NP.  After reviewing all the available data and chart, we discussed the patients laboratory, study & physical findings as well as symptoms in detail. I agree with her findings, examination as well as impression recommendations as per our discussion.    Patient with significant cardiac history and risk factors who presented with osteomyelitis and was taken for redo femoropopliteal bypass on the left. Perioperatively she had significant hypotension and anemia. In this setting she was transferred  to the ICU. She was found to have a but also had positive troponin levels. Patient denies currently having any chest pain whatsoever in her echocardiogram looks relatively stable.  At this point, I think the elevated troponin could very well have been related to demand ischemia, and in the absence of ongoing symptoms, I think the best course of action is to treat medically for now with supportive care, as the event was 2 days ago. She is not actively having symptoms, I simply recommend titrating up medical management using her existing home  medications. Agree with holding beta blocker (with COPD would consider metoprolol or bisoprolol).  Continue high-dose statin and Plavix.  I think at this point probably the best course of action will be consider outpatient stress test evaluation   Bryan Lemma, M.D., M.S. Interventional Cardiologist   Pager # [phone] Phone # [phone] [address] [place], [place] [address]

## 2015-11-06 NOTE — Progress Notes (Signed)
Subjective:  Patient feeling much better has been extubated over the weekend   Antibiotics:  Anti-infectives    Start     Dose/Rate Route Frequency Ordered Stop   11/06/15 1900  vancomycin (VANCOCIN) IVPB 1000 mg/200 mL premix     1,000 mg 200 mL/hr over 60 Minutes Intravenous Every 12 hours 11/06/15 1044     11/03/15 2000  ceFEPIme (MAXIPIME) 2 g in dextrose 5 % 50 mL IVPB     2 g 100 mL/hr over 30 Minutes Intravenous Every 12 hours 11/03/15 1913     11/03/15 1900  vancomycin (VANCOCIN) IVPB 750 mg/150 ml premix  Status:  Discontinued     750 mg 150 mL/hr over 60 Minutes Intravenous Every 12 hours 11/03/15 1743 11/06/15 1044   11/03/15 1900  aztreonam (AZACTAM) 1 g in dextrose 5 % 50 mL IVPB  Status:  Discontinued     1 g 100 mL/hr over 30 Minutes Intravenous Every 8 hours 11/03/15 1743 11/03/15 1909   11/02/15 0600  vancomycin (VANCOCIN) IVPB 1000 mg/200 mL premix  Status:  Discontinued     1,000 mg 200 mL/hr over 60 Minutes Intravenous On call to O.R. 11/01/15 0854 11/01/15 0910   11/01/15 1300  cefTRIAXone (ROCEPHIN) 1 g in dextrose 5 % 50 mL IVPB     1 g 100 mL/hr over 30 Minutes Intravenous  Once 11/01/15 1215 11/01/15 1407   10/31/15 0600  vancomycin (VANCOCIN) IVPB 750 mg/150 ml premix  Status:  Discontinued     750 mg 150 mL/hr over 60 Minutes Intravenous Every 12 hours 10/31/15 0156 11/02/15 1828   10/31/15 0600  aztreonam (AZACTAM) 1 g in dextrose 5 % 50 mL IVPB  Status:  Discontinued     1 g 100 mL/hr over 30 Minutes Intravenous Every 8 hours 10/31/15 0210 11/01/15 1215   10/31/15 0045  aztreonam (AZACTAM) 2 g in dextrose 5 % 50 mL IVPB     2 g 100 mL/hr over 30 Minutes Intravenous  Once 10/31/15 0033 10/31/15 0131   10/30/15 2030  vancomycin (VANCOCIN) 1,500 mg in sodium chloride 0.9 % 500 mL IVPB     1,500 mg 250 mL/hr over 120 Minutes Intravenous  Once 10/30/15 2020 10/30/15 2335   10/30/15 2030  levofloxacin (LEVAQUIN) IVPB 750 mg     750 mg 100  mL/hr over 90 Minutes Intravenous  Once 10/30/15 2020 10/30/15 2231      Medications: Scheduled Meds: . aspirin EC  81 mg Oral Daily  . atorvastatin  80 mg Oral Daily  . ceFEPime (MAXIPIME) IV  2 g Intravenous Q12H  . clopidogrel  75 mg Oral Daily  . collagenase   Topical Daily  . gabapentin  300 mg Oral TID  . insulin aspart  0-15 Units Subcutaneous TID WC  . insulin glargine  35 Units Subcutaneous Q2200  . pantoprazole  40 mg Oral Q1200  . polyethylene glycol  17 g Oral Daily  . senna-docusate  1 tablet Oral QHS  . vancomycin  1,000 mg Intravenous Q12H   Continuous Infusions:   PRN Meds:.acetaminophen **OR** acetaminophen, alum & mag hydroxide-simeth, bisacodyl, fentaNYL (SUBLIMAZE) injection, guaiFENesin-dextromethorphan, magnesium hydroxide, ondansetron, phenol    Objective: Weight change:   Intake/Output Summary (Last 24 hours) at 11/06/15 1414 Last data filed at 11/06/15 1200  Gross per 24 hour  Intake              840 ml  Output  1925 ml  Net            -1085 ml   Blood pressure (!) 162/59, pulse 97, temperature 98.4 F (36.9 C), temperature source Oral, resp. rate (!) 23, height 5\' 4"  (1.626 m), weight 172 lb 13.5 oz (78.4 kg), SpO2 100 %. Temp:  [98.3 F (36.8 C)-99.3 F (37.4 C)] 98.4 F (36.9 C) (10/09 1200) Pulse Rate:  [80-114] 97 (10/09 1300) Resp:  [14-31] 23 (10/09 1300) BP: (96-162)/(40-137) 162/59 (10/09 1300) SpO2:  [93 %-100 %] 100 % (10/09 1300)  Physical Exam: General: Alert and awake, oriented x person and place And hospital  HEENT:EOMI CVS tachy rate, normal r,  no murmur rubs or gallops Chest:  no wheezing, rales or rhonchi Abdomen: soft , nondistended, normal bowel sounds, Extremities: pulse faint in LLE, dressing not taken down Neuro: He shows me her strength in her arms and states that she has had chronic low left upper extremity weakness  CBC:  CBC Latest Ref Rng & Units 11/06/2015 11/05/2015 11/05/2015  WBC 4.0 - 10.5  K/uL 9.6 10.8(H) 12.6(H)  Hemoglobin 12.0 - 15.0 g/dL 5.2(W) 7.8(L) 8.3(L)  Hematocrit 36.0 - 46.0 % 26.3(L) 24.3(L) 25.4(L)  Platelets 150 - 400 K/uL 325 276 272      BMET  Recent Labs  11/03/15 1612 11/05/15 0458 11/06/15 0600  NA 137 140  --   K 4.3 3.3*  --   CL 109 105  --   CO2 20* 27  --   GLUCOSE 114* 96  --   BUN 14 9  --   CREATININE 0.64 0.47 0.43*  CALCIUM 7.8* 8.1*  --      Liver Panel   Recent Labs  11/03/15 1612 11/05/15 0458  PROT 4.8* 5.0*  ALBUMIN 1.6* 1.4*  AST 31 27  ALT 13* 12*  ALKPHOS 85 74  BILITOT 0.7 0.4  BILIDIR 0.1  --   IBILI 0.6  --        Sedimentation Rate No results for input(s): ESRSEDRATE in the last 72 hours. C-Reactive Protein No results for input(s): CRP in the last 72 hours.  Micro Results: Recent Results (from the past 720 hour(s))  Surgical pcr screen     Status: None   Collection Time: 10/31/15  2:02 AM  Result Value Ref Range Status   MRSA, PCR NEGATIVE NEGATIVE Final   Staphylococcus aureus NEGATIVE NEGATIVE Final    Comment:        The Xpert SA Assay (FDA approved for NASAL specimens in patients over 85 years of age), is one component of a comprehensive surveillance program.  Test performance has been validated by [facility] for patients greater than or equal to 62 year old. It is not intended to diagnose infection nor to guide or monitor treatment.   Culture, blood (routine x 2)     Status: None (Preliminary result)   Collection Time: 11/03/15  4:14 PM  Result Value Ref Range Status   Specimen Description BLOOD LEFT HAND  Final   Special Requests IN PEDIATRIC BOTTLE 2CC  Final   Culture NO GROWTH 3 DAYS  Final   Report Status PENDING  Incomplete  Culture, blood (routine x 2)     Status: None (Preliminary result)   Collection Time: 11/03/15  4:17 PM  Result Value Ref Range Status   Specimen Description BLOOD LEFT WRIST  Final   Special Requests BOTTLES DRAWN AEROBIC ONLY 5CC  Final    Culture NO GROWTH 3 DAYS  Final   Report Status PENDING  Incomplete  Urine culture     Status: None   Collection Time: 11/03/15  4:17 PM  Result Value Ref Range Status   Specimen Description URINE, RANDOM  Final   Special Requests NONE  Final   Culture NO GROWTH  Final   Report Status 11/05/2015 FINAL  Final  Urine culture     Status: None   Collection Time: 11/03/15  7:04 PM  Result Value Ref Range Status   Specimen Description URINE, CATHETERIZED  Final   Special Requests NONE  Final   Culture NO GROWTH  Final   Report Status 11/04/2015 FINAL  Final  C difficile quick scan w PCR reflex     Status: None   Collection Time: 11/05/15  4:21 PM  Result Value Ref Range Status   C Diff antigen NEGATIVE NEGATIVE Final   C Diff toxin NEGATIVE NEGATIVE Final   C Diff interpretation No C. difficile detected.  Final    Studies/Results: Dg Chest Port 1 View  Result Date: 11/05/2015 CLINICAL DATA:  Hypoxia EXAM: PORTABLE CHEST 1 VIEW COMPARISON:  November 03, 2015 FINDINGS: Endotracheal tube and nasogastric tube have been removed. Central catheter tip is in the superior vena cava. No pneumothorax. There is a focal area of consolidation in the right upper lobe medially. There is right base atelectasis. Lungs elsewhere clear. Heart is upper normal in size with pulmonary vascularity within normal limits. There is atherosclerotic calcification in the aorta. No adenopathy. IMPRESSION: Small area of airspace consolidation in the right upper lobe medially, likely infiltrate. Mild right base atelectasis. Lungs elsewhere clear. Stable cardiac silhouette. Aortic atherosclerosis. No pneumothorax. Electronically Signed   By: Bretta BangWilliam  Woodruff III M.D.   On: 11/05/2015 10:56      Assessment/Plan:  INTERVAL HISTORY:  Sp VVS surgery with bypass graft  Patient with confusion, earlier LUE weakness on Dr. Adele Danickson's exam later dropped hemoglobin to 5.8, later developed shock intubated and sent to the ICU  Over  the weekend she has been successfully extubated and now is hemodynamically stable she is doing much better neurologically as well  Principal Problem:   Osteomyelitis of left foot (HCC) Active Problems:   Diabetes mellitus type II, uncontrolled (HCC)   Hyperlipidemia with target LDL less than 70   PVD (peripheral vascular disease) with claudication (HCC)   Essential hypertension   Malnutrition of moderate degree   Diabetic foot ulcer (HCC)   S/P femoral-popliteal bypass surgery   Septic shock (HCC)   Encephalopathy acute   Acute blood loss anemia   Acute respiratory failure with hypoxia (HCC)   CVA (cerebral vascular accident) (HCC)    [name] [name] is a 65 y.o. female with with  Poorly controlled diabetes mellitus with peripheral vascular disease diabetic foot ulcers arterial disease and osteomyelitis of third fourth and fifth digits sp Bypass surgery but unfortunately with deterioration postoperatively with confusion, drop in hemoglobin, and shock sp intubation   #1 Diabetic foot ulcers with osteomyelitis then with  shock:  Expect much of the shock may have been due to blood loss rather than infection but I will leave her on her current broad-spectrum antibiotics of vancomycin and cefepime   Dr. Lajoyce Cornersuda hoping to perform amputation of digits when patient stable  I would appreciate cultures from tissue and bone even though pt will have been on broad spectrum antibioitics  #2 ? CVA: wonder if the patient had a hypoperfusion related CNS event due to blood loss +/-  infection       LOS: 7 days   Acey Lav 11/06/2015, 2:14 PM

## 2015-11-06 NOTE — Progress Notes (Signed)
Physical Therapy Wound Treatment Patient Details  Name: [name] [name] MRN: [id number] Date of Birth: April 11, 1950  Today's Date: 11/06/2015 Time: 6122-4497 Time Calculation (min): 20 min  Subjective  Subjective: Pt off vent. Conversing appropriately. Patient and Family Stated Goals: Get better Date of Onset:  (Unknown) Prior Treatments: None noted. Now s/p revascularization LLE.  Pain Score: Pain Score: Minimal with hydrotherapy  Wound Assessment  Wound / Incision (Open or Dehisced) 10/30/15 Diabetic ulcer Foot Left;Mid (Active)  Dressing Type Gauze (Comment) 11/06/2015 12:00 PM  Dressing Changed Changed 11/06/2015 12:00 PM  Dressing Status Clean;Dry;Intact 11/06/2015 12:00 PM  Dressing Change Frequency Daily 11/06/2015 12:00 PM  Site / Wound Assessment Brown;Yellow 11/06/2015 12:00 PM  % Wound base Red or Granulating 0% 11/06/2015 12:00 PM  % Wound base Yellow 100% 11/06/2015 12:00 PM  % Wound base Black 0% 11/06/2015 12:00 PM  % Wound base Other (Comment) 0% 11/06/2015 12:00 PM  Peri-wound Assessment Pink;Denuded;Erythema (blanchable) 11/06/2015 12:00 PM  Wound Length (cm) 2.5 cm 11/04/2015  9:52 AM  Wound Width (cm) 2.75 cm 11/04/2015  9:52 AM  Margins Unattached edges (unapproximated) 11/06/2015 12:00 PM  Closure None 11/06/2015 12:00 PM  Drainage Amount Moderate 11/06/2015 12:00 PM  Drainage Description Purulent 11/06/2015 12:00 PM  Non-staged Wound Description Full thickness 10/31/2015  1:35 AM  Treatment Debridement (Selective);Hydrotherapy (Pulse lavage) 11/06/2015 12:00 PM   Santyl applied to wound bed prior to applying dressing.    Hydrotherapy Pulsed lavage therapy - wound location: Left foot plantar surface and lateral surface Pulsed Lavage with Suction (psi): 8 psi Pulsed Lavage with Suction - Normal Saline Used: 1000 mL Pulsed Lavage Tip: Tip with splash shield Selective Debridement Selective Debridement - Location: Left foot plantar surface Selective Debridement - Tools Used:  Forceps;Scissors Selective Debridement - Tissue Removed: yellow necrotic tissue   Wound Assessment and Plan  Wound Therapy - Assess/Plan/Recommendations Wound Therapy - Clinical Statement: Continuing with hydrotherapy for removal of necrotic tissues. Wound Therapy - Functional Problem List: Wounds impacting mobility and gait Factors Delaying/Impairing Wound Healing: Diabetes Mellitus;Vascular compromise Hydrotherapy Plan: Debridement;Dressing change;Patient/family education;Pulsatile lavage with suction Wound Therapy - Frequency: 6X / week Wound Therapy - Current Recommendations: PT Wound Therapy - Follow Up Recommendations: Home health RN Wound Plan: See above  Wound Therapy Goals- Improve the function of patient's integumentary system by progressing the wound(s) through the phases of wound healing (inflammation - proliferation - remodeling) by: Decrease Necrotic Tissue to: 50% Decrease Necrotic Tissue - Progress: Progressing toward goal Increase Granulation Tissue to: 50% Increase Granulation Tissue - Progress: Progressing toward goal Improve Drainage Characteristics: Min;Serous Improve Drainage Characteristics - Progress: Progressing toward goal Patient/Family will be able to : Understand pressure relief for wound healing and dressing changes. Patient/Family Instruction Goal - Progress: Progressing toward goal  Goals will be updated until maximal potential achieved or discharge criteria met.  Discharge criteria: when goals achieved, discharge from hospital, MD decision/surgical intervention, no progress towards goals, refusal/missing three consecutive treatments without notification or medical reason.  GP     Thos Matsumoto 11/06/2015, 1:06 PM [employer] PT [phone]

## 2015-11-06 NOTE — Progress Notes (Signed)
[facility] TEAM 1 - Stepdown/ICU TEAM  [name] [name]  ZOX:[id number] DOB: [birth year] DOA: 10/30/2015 PCP: Georgann Housekeeper, MD    Brief Narrative:  65 yo F w/ hx PVD / PAD and a L foot plantar abscess who was admitted on 10/2 and underwent L fem-pop bypass on 11/02/15. Evaluated by Dr Lajoyce Corners 10/06 who recommended surgical debridement. Later on 10/6 she was noted to have acutely developed slurred speech, L sided weakness, and altered MS. She had received narcotics 2 hours earlier so she was given narcan. She responded with improved MS. A head CT ruled out acute bleed. Through the following pm she had progressive lethargy, garbled speech, poor airway management and then shock. She was started on dopamine and PCCM was consulted. Neurology was consulted for suspected acute post-op CVA. ABG showed adequate ventilation and oxygenation.  She received 2 u PRBCs for hgb 5.8. Intubated evening of 10/6 for concern for ability to protect airway.   Significant Events: 10/5 s/p L fem-pop bypass 10/6 to ICU with acute shock and AMS - intubated 10/7 extubated   Subjective: The pt is alert and conversant.  She denies cp, sob, n/v, or abdom pain.  She reports that her L foot "feels good."  Assessment & Plan:  Acute hypoxic resp failure  intubated 10/6 > extubated 10/7 - essentially resolved - wean to RA as able   Hemorrhagic shock - Acute blood loss Anemia - bleeding into L thigh S/p 2U PRBC - Hgb fluctuating around transfusion threshold of 8.0 - recheck this afternoon and transfuse an additional unit of PRBC if < 8.0  Recent Labs Lab 11/04/15 0347 11/04/15 1036 11/04/15 2130 11/05/15 1008 11/05/15 2236  HGB 8.8* 7.8* 8.1* 8.3* 7.8*    Sepsis due to osteomyelitis of the foot - L plantar abscess  Sepsis physiology resolved - Ortho and Vascular following - to cont daily hydrotherapy/wound care per Ortho - Ortho planning for surgical debridement this week  Acute MI - Elevated troponin - CAD In the setting  of hypovolemic shock - Troponin peaked at 3.83 - TTE to eval for WMA pending - on ASA - followed by Dr. Daphene Jaeger w/ hx of multiple coronary stent procedures - will ask Cards to see in consultation   DM 2 A1c 7.6 11/02/15 - CBG controlled     Acute altered MS, consider acute CVA although MS and LUE movement have improved   RASS goal: 0 Head CT with no acute bleed Still at some risk that this was CVA, appreciate neuro recs, Would still plan to get MRI brain when stable to do so Restart ASA when stable to do so Minimize sedating meds  Possible Right brain small subcortical infarct known chronic RICA occlusion - suspect failure of collaterals as mechanism due to transient hypotension - Neuro following - on ASA + Plavix   HTN BP reasonably controlled - avoid overcorrection w/ recent CVA  HLD Cont usual lipitor dose   Hypokalemia Replace and follow   DVT prophylaxis: SCDs Code Status: FULL CODE Family Communication: no family present at time of exam  Disposition Plan:   Consultants:  Vasc Surgery  Neuro PCCM ID  Antimicrobials:  Vanc 10/6 >  Cefepime 10/6 > Aztreonam 10/3 > 10/4  Objective: Blood pressure (!) 128/47, pulse 84, temperature 98.3 F (36.8 C), temperature source Oral, resp. rate 18, height 5\' 4"  (1.626 m), weight 78.4 kg (172 lb 13.5 oz), SpO2 95 %.  Intake/Output Summary (Last 24 hours) at 11/06/15 0859 Last data filed  at 11/06/15 0800  Gross per 24 hour  Intake              360 ml  Output             1825 ml  Net            -1465 ml   Filed Weights   11/02/15 1748 11/03/15 1621 11/04/15 0600  Weight: 72 kg (158 lb 11.7 oz) 73.3 kg (161 lb 9.6 oz) 78.4 kg (172 lb 13.5 oz)    Examination: General: No acute respiratory distress Lungs: Clear to auscultation bilaterally without wheezes or crackles Cardiovascular: Regular rate and rhythm without murmur gallop or rub normal S1 and S2 Abdomen: Nontender, nondistended, soft, bowel sounds positive, no  rebound, no ascites, no appreciable mass Extremities: No significant cyanosis, clubbing, or edema bilateral lower extremities  CBC:  Recent Labs Lab 10/30/15 1930  11/04/15 0347 11/04/15 1036 11/04/15 2130 11/05/15 1008 11/05/15 2236  WBC 13.3*  < > 13.3* 12.8* 12.9* 12.6* 10.8*  NEUTROABS 10.0*  --   --   --   --   --   --   HGB 10.5*  < > 8.8* 7.8* 8.1* 8.3* 7.8*  HCT 31.2*  < > 26.1* 23.4* 24.0* 25.4* 24.3*  MCV 85.0  < > 86.4 87.0 86.3 87.9 88.0  PLT 445*  < > 264 240 274 272 276  < > = values in this interval not displayed. Basic Metabolic Panel:  Recent Labs Lab 11/01/15 0647 11/02/15 0330 11/02/15 2202 11/03/15 0416 11/03/15 1612 11/05/15 0458 11/06/15 0600  NA 138 138  --  139 137 140  --   K 4.3 3.7  --  4.2 4.3 3.3*  --   CL 103 104  --  108 109 105  --   CO2 27 27  --  25 20* 27  --   GLUCOSE 120* 133*  --  81 114* 96  --   BUN 13 13  --  12 14 9   --   CREATININE 0.71 0.77  --  0.62 0.64 0.47 0.43*  CALCIUM 8.7* 8.5*  --  8.1* 7.8* 8.1*  --   MG  --   --  1.2*  --   --   --   --    GFR: Estimated Creatinine Clearance: 71.1 mL/min (by C-G formula based on SCr of 0.43 mg/dL (L)).  Liver Function Tests:  Recent Labs Lab 11/03/15 1612 11/05/15 0458  AST 31 27  ALT 13* 12*  ALKPHOS 85 74  BILITOT 0.7 0.4  PROT 4.8* 5.0*  ALBUMIN 1.6* 1.4*    Coagulation Profile:  Recent Labs Lab 11/02/15 0330 11/03/15 2143 11/04/15 0347  INR 1.22 1.22 1.21    Cardiac Enzymes:  Recent Labs Lab 11/03/15 1612 11/03/15 2223 11/04/15 0347 11/04/15 1036  CKTOTAL 604*  --   --   --   TROPONINI 0.14* 1.77* 3.83* 2.32*    HbA1C: Hgb A1c MFr Bld  Date/Time Value Ref Range Status  11/02/2015 03:30 AM 7.6 (H) 4.8 - 5.6 % Final    Comment:    (NOTE)         Pre-diabetes: 5.7 - 6.4         Diabetes: >6.4         Glycemic control for adults with diabetes: <7.0   09/01/2015 01:51 PM 8.3 (H) 4.8 - 5.6 % Final    Comment:    (NOTE)  Pre-diabetes:  5.7 - 6.4         Diabetes: >6.4         Glycemic control for adults with diabetes: <7.0     CBG:  Recent Labs Lab 11/05/15 1611 11/05/15 1914 11/05/15 2353 11/06/15 0324 11/06/15 0814  GLUCAP 91 160* 117* 78 85    Recent Results (from the past 240 hour(s))  Surgical pcr screen     Status: None   Collection Time: 10/31/15  2:02 AM  Result Value Ref Range Status   MRSA, PCR NEGATIVE NEGATIVE Final   Staphylococcus aureus NEGATIVE NEGATIVE Final    Comment:        The Xpert SA Assay (FDA approved for NASAL specimens in patients over 21 years of age), is one component of a comprehensive surveillance program.  Test performance has been validated by [facility] for patients greater than or equal to 61 year old. It is not intended to diagnose infection nor to guide or monitor treatment.   Culture, blood (routine x 2)     Status: None (Preliminary result)   Collection Time: 11/03/15  4:14 PM  Result Value Ref Range Status   Specimen Description BLOOD LEFT HAND  Final   Special Requests IN PEDIATRIC BOTTLE 2CC  Final   Culture NO GROWTH 2 DAYS  Final   Report Status PENDING  Incomplete  Culture, blood (routine x 2)     Status: None (Preliminary result)   Collection Time: 11/03/15  4:17 PM  Result Value Ref Range Status   Specimen Description BLOOD LEFT WRIST  Final   Special Requests BOTTLES DRAWN AEROBIC ONLY 5CC  Final   Culture NO GROWTH 2 DAYS  Final   Report Status PENDING  Incomplete  Urine culture     Status: None   Collection Time: 11/03/15  4:17 PM  Result Value Ref Range Status   Specimen Description URINE, RANDOM  Final   Special Requests NONE  Final   Culture NO GROWTH  Final   Report Status 11/05/2015 FINAL  Final  Urine culture     Status: None   Collection Time: 11/03/15  7:04 PM  Result Value Ref Range Status   Specimen Description URINE, CATHETERIZED  Final   Special Requests NONE  Final   Culture NO GROWTH  Final   Report Status 11/04/2015  FINAL  Final  C difficile quick scan w PCR reflex     Status: None   Collection Time: 11/05/15  4:21 PM  Result Value Ref Range Status   C Diff antigen NEGATIVE NEGATIVE Final   C Diff toxin NEGATIVE NEGATIVE Final   C Diff interpretation No C. difficile detected.  Final     Scheduled Meds: . aspirin EC  81 mg Oral Daily  . atorvastatin  80 mg Oral Daily  . ceFEPime (MAXIPIME) IV  2 g Intravenous Q12H  . clopidogrel  75 mg Oral Daily  . collagenase   Topical Daily  . gabapentin  300 mg Oral TID  . insulin aspart  0-15 Units Subcutaneous Q4H  . insulin glargine  35 Units Subcutaneous Q2200  . pantoprazole sodium  40 mg Per Tube Daily  . polyethylene glycol  17 g Oral Daily  . senna-docusate  1 tablet Oral QHS  . vancomycin  750 mg Intravenous Q12H     LOS: 7 days   Lonia Blood, MD [facility] Office  [phone] Pager - Text Page per Amion as per below:  On-Call/Text Page:  [email or web address]      password [email or web address]  If 7PM-7AM, please contact night-coverage [email or web address] Password [email or web address] 11/06/2015, 8:59 AM

## 2015-11-06 NOTE — Consult Note (Signed)
WOC consult for foot wound requested prior to ortho service involvement.  Dr Lajoyce Corners is now following for assessment and plan of care and plans to take patient to the OR, according to progress notes.  Please refer to him for further questions and re-consult if further assistance is needed.  Thank-you,  Cammie Mcgee MSN, RN, CWOCN, [place], CNS [phone]

## 2015-11-06 NOTE — Progress Notes (Signed)
Pharmacy Antibiotic Note  [name] [name] is a 65 y.o. female admitted on 10/30/2015 with possible osteomyelitis.  Pharmacy has been consulted for vancomycin and cefepime dosing.  Vancomycin trough is sub-therapeutic at 11 on 750mg  IV every 12 hours. Scr is stable at 0.43. Good UOP at 1 cc/kg/hr. Afebrile. WBC is trending down at 10.8 and lactic acid normalized.   Plan: The dose of Vancomycin  will be adjusted to 1000mg  IV every 12 hours based on renal function. Goal trough 15-20 mcg/mL. Continue Cefepime 2g IV every 12 hours. Monitor renal function, clinical status, and levels as appropriate.   Height: 5\' 4"  (162.6 cm) Weight: 172 lb 13.5 oz (78.4 kg) IBW/kg (Calculated) : 54.7  Temp (24hrs), Avg:98.6 F (37 C), Min:98.3 F (36.8 C), Max:99.3 F (37.4 C)   Recent Labs Lab 11/02/15 0330  11/03/15 0416 11/03/15 1612  11/03/15 2329 11/04/15 0347 11/04/15 1036 11/04/15 2130 11/05/15 0458 11/05/15 1008 11/05/15 2236 11/06/15 0600 11/06/15 0615  WBC 9.9  < >  --  17.4*  < >  --  13.3* 12.8* 12.9*  --  12.6* 10.8*  --   --   CREATININE 0.77  --  0.62 0.64  --   --   --   --   --  0.47  --   --  0.43*  --   LATICACIDVEN  --   --   --  3.1*  --  1.6  --   --   --   --   --   --   --   --   VANCOTROUGH  --   --   --   --   --   --   --   --   --   --   --   --   --  11*  < > = values in this interval not displayed.  Estimated Creatinine Clearance: 71.1 mL/min (by C-G formula based on SCr of 0.43 mg/dL (L)).    Allergies  Allergen Reactions  . Hydrochlorothiazide Other (See Comments)    Unknown  . Latex Rash  . Penicillins Swelling and Rash    Pt states she has tolerated Keflex in the past without problems. States she may have tolerated Augmentin in the past but it caused GI upset.    Antimicrobials this admission: 10/3 vanc >> 10/4; 106>> 10/3 aztreonam >> 10/5 10/4 CTX x 1 dose 10/2 LVQ x 1 dose 10/6 cefepime >>  Dose adjustments this admission: 10/9 VT 11 on 750 mg IV  every 12 hours.   Microbiology results: 10/3 MRSA PCR: negative 10/6 Hepatitis panel negative 10/6 Blood x 2 neg 10/8 Cdiff neg 10/6 Urine neg  Thank you for allowing pharmacy to be a part of this patient's care.  Link Snuffer, PharmD, BCPS Clinical Pharmacist [phone] 11/06/2015 10:38 AM

## 2015-11-06 NOTE — Progress Notes (Signed)
   VASCULAR SURGERY ASSESSMENT & PLAN:  4 Days Post-Op s/p: Left fem bk pop bypass with vein. Graft is patent. Has known tibial artery occlusive disease.   Dr. Lajoyce Corners following foot wounds.   Much better from a neurologic standpoint. Appreciate Neurology's help.   SUBJECTIVE: No specific complaints.   PHYSICAL EXAM: Vitals:   11/06/15 0600 11/06/15 0700 11/06/15 0800 11/06/15 0816  BP: (!) 147/54 (!) 129/50 (!) 128/47   Pulse: 94 86 84   Resp: (!) 21 (!) 21 18   Temp:    98.3 F (36.8 C)  TempSrc:    Oral  SpO2: 98% 100% 95%   Weight:      Height:       Brisk left ATA signal with the doppler. Incisions left leg look fine.   LABS: Lab Results  Component Value Date   WBC 10.8 (H) 11/05/2015   HGB 7.8 (L) 11/05/2015   HCT 24.3 (L) 11/05/2015   MCV 88.0 11/05/2015   PLT 276 11/05/2015   Lab Results  Component Value Date   CREATININE 0.43 (L) 11/06/2015   Lab Results  Component Value Date   INR 1.21 11/04/2015   CBG (last 3)   Recent Labs  11/05/15 2353 11/06/15 0324 11/06/15 0814  GLUCAP 117* 78 85    Principal Problem:   Osteomyelitis of left foot (HCC) Active Problems:   Diabetes mellitus type II, uncontrolled (HCC)   Hyperlipidemia with target LDL less than 70   PVD (peripheral vascular disease) with claudication (HCC)   Essential hypertension   Malnutrition of moderate degree   Diabetic foot ulcer (HCC)   S/P femoral-popliteal bypass surgery   Septic shock (HCC)   Encephalopathy acute   Acute blood loss anemia   Acute respiratory failure with hypoxia (HCC)   CVA (cerebral vascular accident) [facility])   Cari Caraway Beeper: [phone] 11/06/2015

## 2015-11-07 ENCOUNTER — Other Ambulatory Visit (HOSPITAL_COMMUNITY): Payer: Medicare Other

## 2015-11-07 DIAGNOSIS — T80219A Unspecified infection due to central venous catheter, initial encounter: Secondary | ICD-10-CM | POA: Diagnosis not present

## 2015-11-07 LAB — COMPREHENSIVE METABOLIC PANEL
ALK PHOS: 85 U/L (ref 38–126)
ALT: 15 U/L (ref 14–54)
AST: 22 U/L (ref 15–41)
Albumin: 1.5 g/dL — ABNORMAL LOW (ref 3.5–5.0)
Anion gap: 7 (ref 5–15)
BILIRUBIN TOTAL: 0.6 mg/dL (ref 0.3–1.2)
BUN: <5 mg/dL — ABNORMAL LOW (ref 6–20)
CALCIUM: 8.2 mg/dL — AB (ref 8.9–10.3)
CO2: 27 mmol/L (ref 22–32)
CREATININE: 0.43 mg/dL — AB (ref 0.44–1.00)
Chloride: 106 mmol/L (ref 101–111)
Glucose, Bld: 64 mg/dL — ABNORMAL LOW (ref 65–99)
Potassium: 3.4 mmol/L — ABNORMAL LOW (ref 3.5–5.1)
SODIUM: 140 mmol/L (ref 135–145)
TOTAL PROTEIN: 5.6 g/dL — AB (ref 6.5–8.1)

## 2015-11-07 LAB — CBC
HEMATOCRIT: 24.7 % — AB (ref 36.0–46.0)
HEMOGLOBIN: 8 g/dL — AB (ref 12.0–15.0)
MCH: 28.8 pg (ref 26.0–34.0)
MCHC: 32.4 g/dL (ref 30.0–36.0)
MCV: 88.8 fL (ref 78.0–100.0)
Platelets: 321 10*3/uL (ref 150–400)
RBC: 2.78 MIL/uL — AB (ref 3.87–5.11)
RDW: 15.2 % (ref 11.5–15.5)
WBC: 8.7 10*3/uL (ref 4.0–10.5)

## 2015-11-07 LAB — GLUCOSE, CAPILLARY
GLUCOSE-CAPILLARY: 214 mg/dL — AB (ref 65–99)
Glucose-Capillary: 106 mg/dL — ABNORMAL HIGH (ref 65–99)
Glucose-Capillary: 190 mg/dL — ABNORMAL HIGH (ref 65–99)
Glucose-Capillary: 122 mg/dL — ABNORMAL HIGH (ref 65–99)

## 2015-11-07 MED ORDER — GABAPENTIN 600 MG PO TABS: 300.0000 mg | ORAL_TABLET | Freq: Three times a day (TID) | ORAL | Status: DC

## 2015-11-07 MED ORDER — INSULIN ASPART 100 UNIT/ML ~~LOC~~ SOLN: 0.0000 [IU] | SUBCUTANEOUS | Status: DC

## 2015-11-07 MED ORDER — METOPROLOL TARTRATE 25 MG PO TABS
25.0000 mg | ORAL_TABLET | Freq: Two times a day (BID) | ORAL | Status: DC
  Administered 2015-11-07 – 2015-11-08 (×3): 25 mg via ORAL
  Filled 2015-11-07 (×3): qty 1

## 2015-11-07 MED ORDER — FENTANYL CITRATE (PF) 100 MCG/2ML IJ SOLN
25.0000 ug | INTRAMUSCULAR | Status: DC | PRN
  Administered 2015-11-07 – 2015-11-08 (×9): 25 ug via INTRAVENOUS
  Filled 2015-11-07 (×9): qty 2

## 2015-11-07 MED ORDER — SODIUM CHLORIDE 0.9 % IV SOLN
INTRAVENOUS | Status: DC
  Administered 2015-11-07: 18:00:00 via INTRAVENOUS

## 2015-11-07 NOTE — Progress Notes (Signed)
   VASCULAR SURGERY ASSESSMENT & PLAN:  5 Days Post-Op s/p: Left femoral to below knee popliteal artery bypass with vein. Excellent anterior tibial signal on the left with Doppler.  Dr. Lajoyce Corners plans surgery on her foot on Friday. Continue dressing changes and intravenous antibiotics.  SUBJECTIVE: No specific complaints.   PHYSICAL EXAM: Vitals:   11/07/15 1100 11/07/15 1158 11/07/15 1200 11/07/15 1300  BP:   (!) 149/71   Pulse: (!) 107  99 (!) 105  Resp: 19  18 19   Temp:  97.9 F (36.6 C)    TempSrc:  Oral    SpO2: 99%  100% 98%  Weight:      Height:       Brisk left anterior tibial signal with Doppler.  Her incisions look fine.   LABS: Lab Results  Component Value Date   WBC 8.7 11/07/2015   HGB 8.0 (L) 11/07/2015   HCT 24.7 (L) 11/07/2015   MCV 88.8 11/07/2015   PLT 321 11/07/2015   Lab Results  Component Value Date   CREATININE 0.43 (L) 11/07/2015   Lab Results  Component Value Date   INR 1.21 11/04/2015   CBG (last 3)   Recent Labs  11/06/15 2130 11/07/15 0840 11/07/15 1157  GLUCAP 158* 106* 190*    Principal Problem:   Osteomyelitis of left foot (HCC) Active Problems:   Diabetes mellitus type II, uncontrolled (HCC)   Hyperlipidemia with target LDL less than 70   PVD (peripheral vascular disease) with claudication (HCC)   Essential hypertension   Malnutrition of moderate degree   Diabetic foot ulcer (HCC)   S/P femoral-popliteal bypass surgery   Septic shock (HCC)   Encephalopathy acute   Acute blood loss anemia   Acute respiratory failure with hypoxia (HCC)   CVA (cerebral vascular accident) [facility])    Cari Caraway Beeper: [phone] 11/07/2015

## 2015-11-07 NOTE — Progress Notes (Signed)
Physical Therapy Wound Treatment Patient Details  Name: [name] [name] MRN: [id number] Date of Birth: 1950/09/07  Today's Date: 11/07/2015 Time: 1000-1030 Time Calculation (min): 30 min  Subjective  Subjective: Pt pleasant and agreeable to therapy Patient and Family Stated Goals: Heal wounds, decrease pain Date of Onset:  (Unknown) Prior Treatments: None noted. Now s/p revascularization LLE.  Pain Score: Pt reported moderate pain throughout session which limited debridement. RN notified and was present with pain meds at end of session.   Wound Assessment  Wound / Incision (Open or Dehisced) 10/30/15 Diabetic ulcer Foot Left;Mid (Active)  Dressing Type ABD;Gauze (Comment);Moist to dry 11/07/2015 10:45 AM  Dressing Changed Changed 11/07/2015 10:45 AM  Dressing Status Clean;Dry;Intact 11/07/2015 10:45 AM  Dressing Change Frequency Daily 11/07/2015 10:45 AM  Site / Wound Assessment Black;Yellow;Pink 11/07/2015 10:45 AM  % Wound base Red or Granulating 5% 11/07/2015 10:45 AM  % Wound base Yellow 75% 11/07/2015 10:45 AM  % Wound base Black 20% 11/07/2015 10:45 AM  % Wound base Other (Comment) 0% 11/07/2015 10:45 AM  Peri-wound Assessment Denuded;Erythema (blanchable);Pink 11/07/2015 10:45 AM  Wound Length (cm) 2.5 cm 11/04/2015  9:52 AM  Wound Width (cm) 2.75 cm 11/04/2015  9:52 AM  Margins Unattached edges (unapproximated) 11/07/2015 10:45 AM  Closure None 11/07/2015 10:45 AM  Drainage Amount Moderate 11/07/2015 10:45 AM  Drainage Description Serosanguineous 11/07/2015 10:45 AM  Non-staged Wound Description Full thickness 11/07/2015 10:45 AM  Treatment Debridement (Selective);Hydrotherapy (Pulse lavage);Packing (Saline gauze) 11/07/2015 10:45 AM   Santyl applied to wound bed prior to applying dressing.     Hydrotherapy Pulsed lavage therapy - wound location: Left foot plantar surface and lateral surface Pulsed Lavage with Suction (psi): 12 psi Pulsed Lavage with Suction - Normal  Saline Used: 1000 mL Pulsed Lavage Tip: Tip with splash shield Selective Debridement Selective Debridement - Location: Left foot plantar surface Selective Debridement - Tools Used: Forceps;Scissors Selective Debridement - Tissue Removed: yellow and black necrotic tissue   Wound Assessment and Plan  Wound Therapy - Assess/Plan/Recommendations Wound Therapy - Clinical Statement: Continuing with hydrotherapy for removal of necrotic tissues. Wound Therapy - Functional Problem List: Wounds impacting mobility and gait Factors Delaying/Impairing Wound Healing: Diabetes Mellitus;Vascular compromise Hydrotherapy Plan: Debridement;Dressing change;Patient/family education;Pulsatile lavage with suction Wound Therapy - Frequency: 6X / week Wound Therapy - Current Recommendations: PT Wound Therapy - Follow Up Recommendations: Home health RN Wound Plan: See above  Wound Therapy Goals- Improve the function of patient's integumentary system by progressing the wound(s) through the phases of wound healing (inflammation - proliferation - remodeling) by: Decrease Necrotic Tissue to: 50% Decrease Necrotic Tissue - Progress: Progressing toward goal Increase Granulation Tissue to: 50% Increase Granulation Tissue - Progress: Progressing toward goal Improve Drainage Characteristics: Min;Serous Improve Drainage Characteristics - Progress: Progressing toward goal Patient/Family will be able to : Understand pressure relief for wound healing and dressing changes. Patient/Family Instruction Goal - Progress: Progressing toward goal Goals/treatment plan/discharge plan were made with and agreed upon by patient/family: Yes Time For Goal Achievement: 2 weeks Wound Therapy - Potential for Goals: Good  Goals will be updated until maximal potential achieved or discharge criteria met.  Discharge criteria: when goals achieved, discharge from hospital, MD decision/surgical intervention, no progress towards goals,  refusal/missing three consecutive treatments without notification or medical reason.  GP     Rolinda Roan 11/07/2015, 10:54 AM   Rolinda Roan, PT, DPT Acute Rehabilitation Services Pager: [phone]

## 2015-11-07 NOTE — Progress Notes (Signed)
PROGRESS NOTE    [name] [name]  ZDG:[id number] DOB: 14-Oct-1950 DOA: 10/30/2015 PCP: Georgann Housekeeper, MD    Brief Narrative: 65 yo F w/ hx PVD / PAD and a L foot plantar abscess who was admitted on 10/2 and underwent L fem-pop bypass on 11/02/15. Evaluated by Dr Lajoyce Corners 10/06 who recommended surgical debridement. Later on 10/6 she was noted to have acutely developed slurred speech, L sided weakness, and altered MS. She had received narcotics 2 hours earlier so she was given narcan. She responded with improved MS. A head CT ruled out acute bleed. Through the following pm she had progressive lethargy, garbled speech, poor airway management and then shock. She was started on dopamine and PCCM was consulted. Neurology was consulted for suspected acute post-op CVA. ABG showed adequate ventilation and oxygenation.  She received 2 u PRBCs for hgb 5.8 due to bleeding into left thigh. Intubated evening of 10/6 for concern for ability to protect airway. Extubated on 10/07. Hb and hct stable. Developed MI type 2 due to anemia, conservative management.   Assessment & Plan:   Principal Problem:   Osteomyelitis of left foot (HCC) Active Problems:   Diabetes mellitus type II, uncontrolled (HCC)   Hyperlipidemia with target LDL less than 70   PVD (peripheral vascular disease) with claudication (HCC)   Essential hypertension   Malnutrition of moderate degree   Diabetic foot ulcer (HCC)   S/P femoral-popliteal bypass surgery   Septic shock (HCC)   Encephalopathy acute   Acute blood loss anemia   Acute respiratory failure with hypoxia (HCC)   CVA (cerebral vascular accident) (HCC)   1. Respiratory failure acute hypoxic. Failure due to decreased mentation, has been improved. Patient is awake and alert, tolerating well nasal cannula, with oxygenation 98 %. Chest film noted right upper lobe infiltrate, will order follow chest film.   2. Osteomyelitis due to diabetic foot ulcer with peripheral vascular disease. Will  continue antibiotic therapy with cefepime/ vancomysn. Follow on cultures. Pain control with fentanyl.   3. Hemorrhagic shock (due to bleeding on left thigh). Hb and hct stable, will continue to follow on cell count in am, no signs of active bleeding. Hb at 8.0 from 8.6.   4. MI type 2. Patient hemodynamic stable, will continue antiplatelet therapy with clopidogrel/ asa and statin therapy. No active chest pain. Continue to follow cell count. Continue metoprolol.   5. T2DM. Will continue glucose cover and monitoring with insulin sliding scale. Gabapentin for diabetic neuropathy. Insulin glargine 35 units. Serum glucose 158-106-190.   6. Acute change in mental status with possible right brain stem subcortical infarct. Patient awake and alert, non focal. Will continue antiplatelet therapy.  7. HTN. Blood pressure controlled will continue, close monitor of vital signs, blood pressure systolic 130 to 150.     DVT prophylaxis: non due to bleeding Code Status: full Family Communication: no family at the bedside Disposition Plan: home    Consultants:   Vascular surgery  Orthopedics  Neurology  Cardiology  Critical Care  Cardiology   Procedures: 10/05  1. Left femoral to below-knee popliteal artery bypass with non-reversed translocated left great saphenous vein. 2. Intraoperative arteriogram    Antimicrobials:   Vancomycin  cefepime   Subjective: Patient with positive pain on the left lower extremity, improved with fentanyl, no radiation, no worsening factors, no associated nausea or vomiting, no fevers.   Objective: Vitals:   11/07/15 1100 11/07/15 1158 11/07/15 1200 11/07/15 1300  BP:   (!) 149/71  Pulse: (!) 107  99 (!) 105  Resp: 19  18 19   Temp:  97.9 F (36.6 C)    TempSrc:  Oral    SpO2: 99%  100% 98%  Weight:      Height:        Intake/Output Summary (Last 24 hours) at 11/07/15 1420 Last data filed at 11/07/15 1100  Gross per 24 hour  Intake              1250 ml  Output             2290 ml  Net            -1040 ml   Filed Weights   11/02/15 1748 11/03/15 1621 11/04/15 0600  Weight: 72 kg (158 lb 11.7 oz) 73.3 kg (161 lb 9.6 oz) 78.4 kg (172 lb 13.5 oz)    Examination:  General exam: Deconditioned E ENT: Mild conjunctival pallor, oral mucosa moist. Respiratory system: Clear to auscultation. Respiratory effort normal. Mild decreased breath sounds at bases, no wheezing, rales or rhonchi.  Cardiovascular system: S1 & S2 heard, RRR. No JVD, murmurs, rubs, gallops or clicks. No pedal edema. Gastrointestinal system: Abdomen is nondistended, soft and nontender. No organomegaly or masses felt. Normal bowel sounds heard. Central nervous system: Alert and oriented. No focal neurological deficits. Extremities: Symmetric 5 x 5 power. Left foot with dressing in place.  Skin: wound with dressing in place on left foot.     Data Reviewed: I have personally reviewed following labs and imaging studies  CBC:  Recent Labs Lab 11/04/15 2130 11/05/15 1008 11/05/15 2236 11/06/15 1345 11/07/15 0447  WBC 12.9* 12.6* 10.8* 9.6 8.7  HGB 8.1* 8.3* 7.8* 8.6* 8.0*  HCT 24.0* 25.4* 24.3* 26.3* 24.7*  MCV 86.3 87.9 88.0 88.6 88.8  PLT 274 272 276 325 321   Basic Metabolic Panel:  Recent Labs Lab 11/02/15 0330 11/02/15 2202 11/03/15 0416 11/03/15 1612 11/05/15 0458 11/06/15 0600 11/07/15 0447  NA 138  --  139 137 140  --  140  K 3.7  --  4.2 4.3 3.3*  --  3.4*  CL 104  --  108 109 105  --  106  CO2 27  --  25 20* 27  --  27  GLUCOSE 133*  --  81 114* 96  --  64*  BUN 13  --  12 14 9   --  <5*  CREATININE 0.77  --  0.62 0.64 0.47 0.43* 0.43*  CALCIUM 8.5*  --  8.1* 7.8* 8.1*  --  8.2*  MG  --  1.2*  --   --   --   --   --    GFR: Estimated Creatinine Clearance: 71.1 mL/min (by C-G formula based on SCr of 0.43 mg/dL (L)). Liver Function Tests:  Recent Labs Lab 11/03/15 1612 11/05/15 0458 11/07/15 0447  AST 31 27 22   ALT 13* 12*  15  ALKPHOS 85 74 85  BILITOT 0.7 0.4 0.6  PROT 4.8* 5.0* 5.6*  ALBUMIN 1.6* 1.4* 1.5*   No results for input(s): LIPASE, AMYLASE in the last 168 hours. No results for input(s): AMMONIA in the last 168 hours. Coagulation Profile:  Recent Labs Lab 11/02/15 0330 11/03/15 2143 11/04/15 0347  INR 1.22 1.22 1.21   Cardiac Enzymes:  Recent Labs Lab 11/03/15 1612 11/03/15 2223 11/04/15 0347 11/04/15 1036  CKTOTAL 604*  --   --   --   TROPONINI 0.14* 1.77* 3.83* 2.32*  BNP (last 3 results) No results for input(s): PROBNP in the last 8760 hours. HbA1C: No results for input(s): HGBA1C in the last 72 hours. CBG:  Recent Labs Lab 11/06/15 1133 11/06/15 1537 11/06/15 2130 11/07/15 0840 11/07/15 1157  GLUCAP 124* 151* 158* 106* 190*   Lipid Profile:  Recent Labs  11/05/15 0458  CHOL 91  HDL 25*  LDLCALC 44  TRIG 960  CHOLHDL 3.6   Thyroid Function Tests: No results for input(s): TSH, T4TOTAL, FREET4, T3FREE, THYROIDAB in the last 72 hours. Anemia Panel: No results for input(s): VITAMINB12, FOLATE, FERRITIN, TIBC, IRON, RETICCTPCT in the last 72 hours. Sepsis Labs:  Recent Labs Lab 11/03/15 1612 11/03/15 2329  LATICACIDVEN 3.1* 1.6    Recent Results (from the past 240 hour(s))  Surgical pcr screen     Status: None   Collection Time: 10/31/15  2:02 AM  Result Value Ref Range Status   MRSA, PCR NEGATIVE NEGATIVE Final   Staphylococcus aureus NEGATIVE NEGATIVE Final    Comment:        The Xpert SA Assay (FDA approved for NASAL specimens in patients over 43 years of age), is one component of a comprehensive surveillance program.  Test performance has been validated by [facility] for patients greater than or equal to 63 year old. It is not intended to diagnose infection nor to guide or monitor treatment.   Culture, blood (routine x 2)     Status: None (Preliminary result)   Collection Time: 11/03/15  4:14 PM  Result Value Ref Range Status    Specimen Description BLOOD LEFT HAND  Final   Special Requests IN PEDIATRIC BOTTLE 2CC  Final   Culture NO GROWTH 4 DAYS  Final   Report Status PENDING  Incomplete  Culture, blood (routine x 2)     Status: None (Preliminary result)   Collection Time: 11/03/15  4:17 PM  Result Value Ref Range Status   Specimen Description BLOOD LEFT WRIST  Final   Special Requests BOTTLES DRAWN AEROBIC ONLY 5CC  Final   Culture NO GROWTH 4 DAYS  Final   Report Status PENDING  Incomplete  Urine culture     Status: None   Collection Time: 11/03/15  4:17 PM  Result Value Ref Range Status   Specimen Description URINE, RANDOM  Final   Special Requests NONE  Final   Culture NO GROWTH  Final   Report Status 11/05/2015 FINAL  Final  Urine culture     Status: None   Collection Time: 11/03/15  7:04 PM  Result Value Ref Range Status   Specimen Description URINE, CATHETERIZED  Final   Special Requests NONE  Final   Culture NO GROWTH  Final   Report Status 11/04/2015 FINAL  Final  C difficile quick scan w PCR reflex     Status: None   Collection Time: 11/05/15  4:21 PM  Result Value Ref Range Status   C Diff antigen NEGATIVE NEGATIVE Final   C Diff toxin NEGATIVE NEGATIVE Final   C Diff interpretation No C. difficile detected.  Final         Radiology Studies: No results found.      Scheduled Meds: . aspirin EC  81 mg Oral Daily  . atorvastatin  80 mg Oral Daily  . ceFEPime (MAXIPIME) IV  2 g Intravenous Q12H  . clopidogrel  75 mg Oral Daily  . collagenase   Topical Daily  . gabapentin  300 mg Oral TID  . gabapentin  300 mg Oral TID  . insulin aspart  0-15 Units Subcutaneous TID WC  . insulin glargine  35 Units Subcutaneous Q2200  . pantoprazole  40 mg Oral Q1200  . polyethylene glycol  17 g Oral Daily  . senna-docusate  1 tablet Oral QHS  . vancomycin  1,000 mg Intravenous Q12H   Continuous Infusions:    LOS: 8 days     Ilma Achee Annett Gulaaniel Dewayne Severe, MD [facility] Pager  [phone]  If 7PM-7AM, please contact night-coverage [email or web address] Password [email or web address] 11/07/2015, 2:20 PM

## 2015-11-07 NOTE — Care Management Important Message (Signed)
Important Message  Patient Details  Name: [name] [name] MRN: [id number] Date of Birth: [birth year]   Medicare Important Message Given:  Other (see comment)    Kathaleen Dudziak Abena 11/07/2015, 9:01 AM

## 2015-11-07 NOTE — Care Management Note (Signed)
Case Management Note  Original Note initiated by Letha Cape 11/03/15  Patient Details  Name: [name] [name] MRN: [id number] Date of Birth: [birth year]  Subjective/Objective:   NCM going to speak with patient and code was working with patient, she transferred to ICU.  Tiffany with [facility] states patient is pre set up with them for [facility]. NCM will need to see if patient will want these services and notify Tiffany with [facility] is she does.  NCM will cont to follow for dc needs.                 Action/Plan:   Expected Discharge Date:  11/02/15               Expected Discharge Plan:  Skilled Nursing Facility  In-House Referral:  Clinical Social Work  Discharge planning Services  CM Consult  Post Acute Care Choice:    Choice offered to:     DME Arranged:    DME Agency:     HH Arranged:    HH Agency:     Status of Service:  In process, will continue to follow  If discussed at Long Length of Stay Meetings, dates discussed:    Additional Comments: 11/07/2015 SNF recommended for discharge - CSW consulted  Cherylann Parr, RN 11/07/2015, 2:12 PM

## 2015-11-07 NOTE — Progress Notes (Signed)
Patient ID: [name] [name] Age, female   DOB: 19-Jun-1950, 65 y.o.   MRN: [id number] Patient stabilizing. Plan for left foot surgical debridement plantar ulcer with placement of infusion wound VAC on Friday morning.

## 2015-11-07 NOTE — Progress Notes (Signed)
Subjective:  Patient feeling much better    Antibiotics:  Anti-infectives    Start     Dose/Rate Route Frequency Ordered Stop   11/06/15 1900  vancomycin (VANCOCIN) IVPB 1000 mg/200 mL premix     1,000 mg 200 mL/hr over 60 Minutes Intravenous Every 12 hours 11/06/15 1044     11/03/15 2000  ceFEPIme (MAXIPIME) 2 g in dextrose 5 % 50 mL IVPB     2 g 100 mL/hr over 30 Minutes Intravenous Every 12 hours 11/03/15 1913     11/03/15 1900  vancomycin (VANCOCIN) IVPB 750 mg/150 ml premix  Status:  Discontinued     750 mg 150 mL/hr over 60 Minutes Intravenous Every 12 hours 11/03/15 1743 11/06/15 1044   11/03/15 1900  aztreonam (AZACTAM) 1 g in dextrose 5 % 50 mL IVPB  Status:  Discontinued     1 g 100 mL/hr over 30 Minutes Intravenous Every 8 hours 11/03/15 1743 11/03/15 1909   11/02/15 0600  vancomycin (VANCOCIN) IVPB 1000 mg/200 mL premix  Status:  Discontinued     1,000 mg 200 mL/hr over 60 Minutes Intravenous On call to O.R. 11/01/15 0854 11/01/15 0910   11/01/15 1300  cefTRIAXone (ROCEPHIN) 1 g in dextrose 5 % 50 mL IVPB     1 g 100 mL/hr over 30 Minutes Intravenous  Once 11/01/15 1215 11/01/15 1407   10/31/15 0600  vancomycin (VANCOCIN) IVPB 750 mg/150 ml premix  Status:  Discontinued     750 mg 150 mL/hr over 60 Minutes Intravenous Every 12 hours 10/31/15 0156 11/02/15 1828   10/31/15 0600  aztreonam (AZACTAM) 1 g in dextrose 5 % 50 mL IVPB  Status:  Discontinued     1 g 100 mL/hr over 30 Minutes Intravenous Every 8 hours 10/31/15 0210 11/01/15 1215   10/31/15 0045  aztreonam (AZACTAM) 2 g in dextrose 5 % 50 mL IVPB     2 g 100 mL/hr over 30 Minutes Intravenous  Once 10/31/15 0033 10/31/15 0131   10/30/15 2030  vancomycin (VANCOCIN) 1,500 mg in sodium chloride 0.9 % 500 mL IVPB     1,500 mg 250 mL/hr over 120 Minutes Intravenous  Once 10/30/15 2020 10/30/15 2335   10/30/15 2030  levofloxacin (LEVAQUIN) IVPB 750 mg     750 mg 100 mL/hr over 90 Minutes Intravenous   Once 10/30/15 2020 10/30/15 2231      Medications: Scheduled Meds: . aspirin EC  81 mg Oral Daily  . atorvastatin  80 mg Oral Daily  . ceFEPime (MAXIPIME) IV  2 g Intravenous Q12H  . clopidogrel  75 mg Oral Daily  . collagenase   Topical Daily  . gabapentin  300 mg Oral TID  . insulin aspart  0-15 Units Subcutaneous TID WC  . insulin glargine  35 Units Subcutaneous Q2200  . metoprolol tartrate  25 mg Oral BID  . pantoprazole  40 mg Oral Q1200  . polyethylene glycol  17 g Oral Daily  . senna-docusate  1 tablet Oral QHS  . vancomycin  1,000 mg Intravenous Q12H   Continuous Infusions: . sodium chloride     PRN Meds:.acetaminophen **OR** acetaminophen, alum & mag hydroxide-simeth, bisacodyl, fentaNYL (SUBLIMAZE) injection, guaiFENesin-dextromethorphan, magnesium hydroxide, ondansetron, phenol    Objective: Weight change:   Intake/Output Summary (Last 24 hours) at 11/07/15 1604 Last data filed at 11/07/15 1400  Gross per 24 hour  Intake  1410 ml  Output             2090 ml  Net             -680 ml   Blood pressure (!) 130/118, pulse (!) 108, temperature 98 F (36.7 C), temperature source Oral, resp. rate 17, height 5\' 4"  (1.626 m), weight 172 lb 13.5 oz (78.4 kg), SpO2 98 %. Temp:  [97.9 F (36.6 C)-99.2 F (37.3 C)] 98 F (36.7 C) (10/10 1509) Pulse Rate:  [88-115] 108 (10/10 1540) Resp:  [10-29] 17 (10/10 1540) BP: (128-157)/(47-118) 130/118 (10/10 1400) SpO2:  [95 %-100 %] 98 % (10/10 1540)  Physical Exam: General: Alert and awake, oriented x person and place And hospital  HEENT:EOMI CVS tachy rate, normal r,  no murmur rubs or gallops Chest:  no wheezing, rales or rhonchi Abdomen: soft , nondistended, normal bowel sounds, Extremities: pulse faint in LLE, dressing not taken down Neuro: He shows me her strength in her arms and states that she has had chronic low left upper extremity weakness  CBC:  CBC Latest Ref Rng & Units 11/07/2015 11/06/2015  11/05/2015  WBC 4.0 - 10.5 K/uL 8.7 9.6 10.8(H)  Hemoglobin 12.0 - 15.0 g/dL 8.0(L) 8.6(L) 7.8(L)  Hematocrit 36.0 - 46.0 % 24.7(L) 26.3(L) 24.3(L)  Platelets 150 - 400 K/uL 321 325 276      BMET  Recent Labs  11/05/15 0458 11/06/15 0600 11/07/15 0447  NA 140  --  140  K 3.3*  --  3.4*  CL 105  --  106  CO2 27  --  27  GLUCOSE 96  --  64*  BUN 9  --  <5*  CREATININE 0.47 0.43* 0.43*  CALCIUM 8.1*  --  8.2*     Liver Panel   Recent Labs  11/05/15 0458 11/07/15 0447  PROT 5.0* 5.6*  ALBUMIN 1.4* 1.5*  AST 27 22  ALT 12* 15  ALKPHOS 74 85  BILITOT 0.4 0.6       Sedimentation Rate No results for input(s): ESRSEDRATE in the last 72 hours. C-Reactive Protein No results for input(s): CRP in the last 72 hours.  Micro Results: Recent Results (from the past 720 hour(s))  Surgical pcr screen     Status: None   Collection Time: 10/31/15  2:02 AM  Result Value Ref Range Status   MRSA, PCR NEGATIVE NEGATIVE Final   Staphylococcus aureus NEGATIVE NEGATIVE Final    Comment:        The Xpert SA Assay (FDA approved for NASAL specimens in patients over 72 years of age), is one component of a comprehensive surveillance program.  Test performance has been validated by [facility] for patients greater than or equal to 75 year old. It is not intended to diagnose infection nor to guide or monitor treatment.   Culture, blood (routine x 2)     Status: None (Preliminary result)   Collection Time: 11/03/15  4:14 PM  Result Value Ref Range Status   Specimen Description BLOOD LEFT HAND  Final   Special Requests IN PEDIATRIC BOTTLE 2CC  Final   Culture NO GROWTH 4 DAYS  Final   Report Status PENDING  Incomplete  Culture, blood (routine x 2)     Status: None (Preliminary result)   Collection Time: 11/03/15  4:17 PM  Result Value Ref Range Status   Specimen Description BLOOD LEFT WRIST  Final   Special Requests BOTTLES DRAWN AEROBIC ONLY 5CC  Final   Culture  NO GROWTH  4 DAYS  Final   Report Status PENDING  Incomplete  Urine culture     Status: None   Collection Time: 11/03/15  4:17 PM  Result Value Ref Range Status   Specimen Description URINE, RANDOM  Final   Special Requests NONE  Final   Culture NO GROWTH  Final   Report Status 11/05/2015 FINAL  Final  Urine culture     Status: None   Collection Time: 11/03/15  7:04 PM  Result Value Ref Range Status   Specimen Description URINE, CATHETERIZED  Final   Special Requests NONE  Final   Culture NO GROWTH  Final   Report Status 11/04/2015 FINAL  Final  C difficile quick scan w PCR reflex     Status: None   Collection Time: 11/05/15  4:21 PM  Result Value Ref Range Status   C Diff antigen NEGATIVE NEGATIVE Final   C Diff toxin NEGATIVE NEGATIVE Final   C Diff interpretation No C. difficile detected.  Final    Studies/Results: No results found.    Assessment/Plan:  INTERVAL HISTORY:  Sp VVS surgery with bypass graft  Patient with confusion, earlier LUE weakness on Dr. Adele Dan exam later dropped hemoglobin to 5.8, later developed shock intubated and sent to the ICU  Over the weekend she has been successfully extubated and now is hemodynamically stable she is doing much better neurologically as well  She is feeling better and better  Principal Problem:   Osteomyelitis of left foot (HCC) Active Problems:   Diabetes mellitus type II, uncontrolled (HCC)   Hyperlipidemia with target LDL less than 70   PVD (peripheral vascular disease) with claudication (HCC)   Essential hypertension   Malnutrition of moderate degree   Diabetic foot ulcer (HCC)   S/P femoral-popliteal bypass surgery   Septic shock (HCC)   Encephalopathy acute   Acute blood loss anemia   Acute respiratory failure with hypoxia (HCC)   CVA (cerebral vascular accident) (HCC)    [name] [name] is a 65 y.o. female with with  Poorly controlled diabetes mellitus with peripheral vascular disease diabetic foot ulcers arterial  disease and osteomyelitis of third fourth and fifth digits sp Bypass surgery but unfortunately with deterioration postoperatively with confusion, drop in hemoglobin, and shock sp intubation   #1 Diabetic foot ulcers with osteomyelitis then with  shock:  Expect much of the shock may have been due to blood loss rather than infection but I will leave her on her current broad-spectrum antibiotics of vancomycin and cefepime   Dr. Lajoyce Corners hoping to perform amputation of digits this Friday  I would appreciate cultures from tissue and bone even though pt will have been on broad spectrum antibioitics  #2 ? CVA: wonder if the patient had a hypoperfusion related CNS event due to blood loss +/- infection  I will follow peripherally in the meantime.       LOS: 8 days   Acey Lav 11/07/2015, 4:04 PM

## 2015-11-08 ENCOUNTER — Inpatient Hospital Stay (HOSPITAL_COMMUNITY): Payer: Medicare Other

## 2015-11-08 ENCOUNTER — Other Ambulatory Visit (HOSPITAL_COMMUNITY): Payer: Self-pay | Admitting: Family

## 2015-11-08 ENCOUNTER — Encounter (HOSPITAL_COMMUNITY): Payer: Self-pay | Admitting: Cardiology

## 2015-11-08 DIAGNOSIS — Z419 Encounter for procedure for purposes other than remedying health state, unspecified: Secondary | ICD-10-CM

## 2015-11-08 DIAGNOSIS — E118 Type 2 diabetes mellitus with unspecified complications: Secondary | ICD-10-CM

## 2015-11-08 DIAGNOSIS — M86272 Subacute osteomyelitis, left ankle and foot: Secondary | ICD-10-CM

## 2015-11-08 DIAGNOSIS — E1122 Type 2 diabetes mellitus with diabetic chronic kidney disease: Secondary | ICD-10-CM

## 2015-11-08 DIAGNOSIS — R579 Shock, unspecified: Secondary | ICD-10-CM

## 2015-11-08 DIAGNOSIS — I2510 Atherosclerotic heart disease of native coronary artery without angina pectoris: Secondary | ICD-10-CM

## 2015-11-08 DIAGNOSIS — T794XXA Traumatic shock, initial encounter: Secondary | ICD-10-CM

## 2015-11-08 DIAGNOSIS — I219 Acute myocardial infarction, unspecified: Secondary | ICD-10-CM | POA: Diagnosis not present

## 2015-11-08 LAB — BASIC METABOLIC PANEL
ANION GAP: 8 (ref 5–15)
BUN: 5 mg/dL (ref 4–21)
BUN: 5 mg/dL — ABNORMAL LOW (ref 6–20)
CHLORIDE: 105 mmol/L (ref 101–111)
CO2: 27 mmol/L (ref 22–32)
Calcium: 8.5 mg/dL — ABNORMAL LOW (ref 8.9–10.3)
Creatinine, Ser: 0.41 mg/dL — ABNORMAL LOW (ref 0.44–1.00)
Creatinine: 0.4 mg/dL — AB (ref 0.5–1.1)
GFR calc Af Amer: >60 mL/min (ref >60)
GLUCOSE: 51 mg/dL — AB (ref 65–99)
Glucose: 51 mg/dL
POTASSIUM: 3.1 mmol/L — AB (ref 3.5–5.1)
Potassium: 3.1 mmol/L — AB (ref 3.4–5.3)
Sodium: 140 mmol/L (ref 135–145)
Sodium: 140 mmol/L (ref 137–147)

## 2015-11-08 LAB — CBC WITH DIFFERENTIAL/PLATELET
BASOS ABS: 0 10*3/uL (ref 0.0–0.1)
Basophils Relative: 0 %
EOS ABS: 0.3 10*3/uL (ref 0.0–0.7)
Eosinophils Relative: 2 %
HCT: 25 % — ABNORMAL LOW (ref 36.0–46.0)
HEMOGLOBIN: 8.3 g/dL — AB (ref 12.0–15.0)
LYMPHS ABS: 1.8 10*3/uL (ref 0.7–4.0)
Lymphocytes Relative: 15 %
MCH: 28.5 pg (ref 26.0–34.0)
MCHC: 33.2 g/dL (ref 30.0–36.0)
MCV: 85.9 fL (ref 78.0–100.0)
Monocytes Absolute: 1.2 10*3/uL — ABNORMAL HIGH (ref 0.1–1.0)
Monocytes Relative: 10 %
NEUTROS PCT: 73 %
Neutro Abs: 8.5 10*3/uL — ABNORMAL HIGH (ref 1.7–7.7)
Platelets: 448 10*3/uL — ABNORMAL HIGH (ref 150–400)
RBC: 2.91 MIL/uL — AB (ref 3.87–5.11)
RDW: 15.5 % (ref 11.5–15.5)
WBC: 11.8 10*3/uL — AB (ref 4.0–10.5)

## 2015-11-08 LAB — GLUCOSE, CAPILLARY
GLUCOSE-CAPILLARY: 107 mg/dL — AB (ref 65–99)
GLUCOSE-CAPILLARY: 143 mg/dL — AB (ref 65–99)
GLUCOSE-CAPILLARY: 259 mg/dL — AB (ref 65–99)
Glucose-Capillary: 51 mg/dL — ABNORMAL LOW (ref 65–99)
Glucose-Capillary: 137 mg/dL — ABNORMAL HIGH (ref 65–99)

## 2015-11-08 LAB — ECHOCARDIOGRAM COMPLETE
CHL CUP MV DEC (S): 164
EERAT: 14.76
EWDT: 164 ms
FS: 23 % — AB (ref 28–44)
Height: 64 in
IV/PV OW: 1.17
LA diam end sys: 43 mm
LADIAMINDEX: 2.42 cm/m2
LASIZE: 43 mm
LAVOL: 50.9 mL
LAVOLA4C: 60.7 mL
LAVOLIN: 28.6 mL/m2
LV E/e'average: 14.76
LV TDI E'LATERAL: 8.81
LVEEMED: 14.76
LVELAT: 8.81 cm/s
LVOT SV: 47 mL
LVOT VTI: 16.7 cm
LVOT area: 2.84 cm2
LVOT diameter: 19 mm
LVOTPV: 85.2 cm/s
Lateral S' vel: 15.3 cm/s
MVPG: 7 mmHg
MVPKAVEL: 132 m/s
MVPKEVEL: 130 m/s
PW: 9.48 mm — AB (ref 0.6–1.1)
RV TAPSE: 19.5 mm
TDI e' medial: 9.79
Weight: 2546.75 oz

## 2015-11-08 LAB — CBC AND DIFFERENTIAL
HCT: 25 % — AB (ref 36–46)
HEMOGLOBIN: 8.3 g/dL — AB (ref 12.0–16.0)
PLATELETS: 448 10*3/uL — AB (ref 150–399)
WBC: 11.8 10*3/mL

## 2015-11-08 LAB — CULTURE, BLOOD (ROUTINE X 2)
CULTURE: NO GROWTH
Culture: NO GROWTH

## 2015-11-08 MED ORDER — METOPROLOL TARTRATE 50 MG PO TABS
50.0000 mg | ORAL_TABLET | Freq: Two times a day (BID) | ORAL | Status: DC
  Administered 2015-11-08 – 2015-11-14 (×11): 50 mg via ORAL
  Filled 2015-11-08 (×12): qty 1

## 2015-11-08 MED ORDER — PRO-STAT SUGAR FREE PO LIQD
30.0000 mL | Freq: Two times a day (BID) | ORAL | Status: DC
  Administered 2015-11-08 – 2015-11-14 (×11): 30 mL via ORAL
  Filled 2015-11-08 (×10): qty 30

## 2015-11-08 MED ORDER — DIPHENHYDRAMINE HCL 25 MG PO CAPS: 50.0000 mg | ORAL_CAPSULE | Freq: Once | ORAL | Status: DC

## 2015-11-08 MED ORDER — LISINOPRIL 5 MG PO TABS
2.5000 mg | ORAL_TABLET | Freq: Every day | ORAL | Status: DC
  Administered 2015-11-09: 2.5 mg via ORAL
  Filled 2015-11-08 (×4): qty 1

## 2015-11-08 MED ORDER — METOPROLOL TARTRATE 25 MG PO TABS
25.0000 mg | ORAL_TABLET | Freq: Once | ORAL | Status: AC
  Administered 2015-11-08: 25 mg via ORAL
  Filled 2015-11-08: qty 1

## 2015-11-08 NOTE — Progress Notes (Signed)
  Echocardiogram 2D Echocardiogram has been performed.  Leta Jungling M 11/08/2015, 8:12 AM

## 2015-11-08 NOTE — Progress Notes (Signed)
Pt transferred from 45M to 3S15 after report.  Pt arrives A&Ox4 and is oriented to surroundings with call bell at side.  Daughter accompanies her.  Pulses are dopplerable.  Will continue to monitor.

## 2015-11-08 NOTE — Progress Notes (Signed)
Nutrition Follow-up  DOCUMENTATION CODES:   Non-severe (moderate) malnutrition in context of acute illness/injury  INTERVENTION:    Pro-stat 30 ml BID with meals, each supplement provides 100 kcal and 15 gm protein  NUTRITION DIAGNOSIS:   Increased nutrient needs related to wound healing as evidenced by estimated needs.  Ongoing  GOAL:   Patient will meet greater than or equal to 90% of their needs  Progressing  MONITOR:   PO intake, Supplement acceptance, Labs, Weight trends, Skin, I & O's  ASSESSMENT:   65 y.o. female with medical history significant of DM, PVD with recent revascularization, MI in 2011, HTN, HLD, and peripheral neuropathy presenting with a diabetic foot infection.  S/P left femoral to below knee popliteal artery bypass with vein on 10/5. Plans for toe amputations on Friday. Patient reports good intake of meals. She does not like the Glucerna supplements, but agreed to try Pro-stat liquid protein supplement. Discussed increased protein needs and good dietary sources of protein with patient.   Diet Order:  Diet Carb Modified Fluid consistency: Thin; Room service appropriate? Yes  Skin:  Wound (see comment) (Diabetic ulcer L foot)  Last BM:  10/9  Height:   Ht Readings from Last 1 Encounters:  11/03/15 5\' 4"  (1.626 m)    Weight:   Wt Readings from Last 1 Encounters:  11/07/15 159 lb 2.8 oz (72.2 kg)    Ideal Body Weight:  54.5 kg  BMI:  Body mass index is 27.32 kg/m.  Estimated Nutritional Needs:   Kcal:  1800-1950  Protein:  85-95 grams  Fluid:  1.8 - 2 L/day  EDUCATION NEEDS:   No education needs identified at this time  Joaquin Courts, RD, LDN, CNSC Pager [phone] After Hours Pager [phone]

## 2015-11-08 NOTE — Progress Notes (Signed)
Physical Therapy Wound Treatment Patient Details  Name: [name] [name] MRN: [id number] Date of Birth: 18-Jun-1950  Today's Date: 11/08/2015 Time:  -     Subjective  Subjective: Pt pleasant and agreeable to therapy Patient and Family Stated Goals: Heal wounds, decrease pain Date of Onset:  (Unknown) Prior Treatments: None noted. Now s/p revascularization LLE.  Pain Score:  Pt was premedicated however had increased pain during treatment which limited selective debridement.   Wound Assessment  Wound / Incision (Open or Dehisced) 10/30/15 Diabetic ulcer Foot Left;Mid (Active)  Dressing Type ABD;Gauze (Comment);Moist to dry 11/08/2015  9:31 AM  Dressing Changed Changed 11/08/2015  9:31 AM  Dressing Status Clean;Dry;Intact 11/08/2015  9:31 AM  Dressing Change Frequency Daily 11/08/2015  9:31 AM  Site / Wound Assessment Black;Yellow;Pink 11/08/2015  9:31 AM  % Wound base Red or Granulating 10% 11/08/2015  9:31 AM  % Wound base Yellow 75% 11/08/2015  9:31 AM  % Wound base Black 15% 11/08/2015  9:31 AM  % Wound base Other (Comment) 0% 11/08/2015  9:31 AM  Peri-wound Assessment Pink;Denuded;Erythema (blanchable) 11/08/2015  9:31 AM  Wound Length (cm) 2.5 cm 11/04/2015  9:52 AM  Wound Width (cm) 2.75 cm 11/04/2015  9:52 AM  Margins Unattached edges (unapproximated) 11/08/2015  9:31 AM  Closure None 11/08/2015  9:31 AM  Drainage Amount Moderate 11/08/2015  9:31 AM  Drainage Description Serosanguineous 11/08/2015  9:31 AM  Non-staged Wound Description Full thickness 11/08/2015  9:31 AM  Treatment Debridement (Selective);Hydrotherapy (Pulse lavage);Packing (Saline gauze) 11/08/2015  9:31 AM  Santyl applied to wound bed prior to applying dressing.   Hydrotherapy Pulsed lavage therapy - wound location: Left foot plantar surface and lateral surface Pulsed Lavage with Suction (psi): 12 psi Pulsed Lavage with Suction - Normal Saline Used: 1000 mL Pulsed Lavage Tip: Tip with splash shield Selective  Debridement Selective Debridement - Location: Left foot plantar surface Selective Debridement - Tools Used: Forceps;Scissors Selective Debridement - Tissue Removed: yellow and black necrotic tissue   Wound Assessment and Plan  Wound Therapy - Assess/Plan/Recommendations Wound Therapy - Clinical Statement: Continuing with hydrotherapy for removal of necrotic tissues. Pt would benefit from selective debridement of lateral wounds - MD if you feel this is appropriate please add to Hydrotherapy order.  Wound Therapy - Functional Problem List: Wounds impacting mobility and gait Factors Delaying/Impairing Wound Healing: Diabetes Mellitus;Vascular compromise Hydrotherapy Plan: Debridement;Dressing change;Patient/family education;Pulsatile lavage with suction Wound Therapy - Frequency: 6X / week Wound Therapy - Current Recommendations: PT Wound Therapy - Follow Up Recommendations: Home health RN Wound Plan: See above  Wound Therapy Goals- Improve the function of patient's integumentary system by progressing the wound(s) through the phases of wound healing (inflammation - proliferation - remodeling) by: Decrease Necrotic Tissue to: 50% Decrease Necrotic Tissue - Progress: Progressing toward goal Increase Granulation Tissue to: 50% Increase Granulation Tissue - Progress: Progressing toward goal Improve Drainage Characteristics: Min;Serous Improve Drainage Characteristics - Progress: Progressing toward goal Patient/Family will be able to : Understand pressure relief for wound healing and dressing changes. Patient/Family Instruction Goal - Progress: Progressing toward goal Goals/treatment plan/discharge plan were made with and agreed upon by patient/family: Yes Time For Goal Achievement: 2 weeks Wound Therapy - Potential for Goals: Good  Goals will be updated until maximal potential achieved or discharge criteria met.  Discharge criteria: when goals achieved, discharge from hospital, MD  decision/surgical intervention, no progress towards goals, refusal/missing three consecutive treatments without notification or medical reason.  GP     Rich Reining,  Mickel Baas 11/08/2015, 10:20 AM   Rolinda Roan, PT, DPT Acute Rehabilitation Services Pager: [phone]

## 2015-11-08 NOTE — Progress Notes (Signed)
Physical Therapy Treatment Patient Details Name: [name] [name] MRN: [id number] DOB: 10/10/1950 Today's Date: 11/08/2015    History of Present Illness Pt adm with lt plantar abscess and underwent revascularization on 10/5. On 10/6 acutely developed slurred speech, L sided weakness, and altered MS. CT negative but neurology consulted and felt it was right brain  Small subcortical infarct not seen on CT. Pt PMH - DM, PVD, PAD, MI, HTN, CAD, arthritis,    PT Comments    Patient progressing some with ability to take steps towards door.  Was hypoglycemic this am, but received juice and was improved above 100.  HR limiting ambulation due to up to 144.  She will need SNF level rehab at d/c.  Will continue skilled PT in the acute setting.   Follow Up Recommendations  SNF     Equipment Recommendations  None recommended by PT    Recommendations for Other Services       Precautions / Restrictions Precautions Precautions: Fall Precaution Comments: watch HR    Mobility  Bed Mobility Overal bed mobility: Needs Assistance Bed Mobility: Supine to Sit     Supine to sit: Min assist;HOB elevated     General bed mobility comments: increased time, cues and heavy use of bed rail; extra time/effort to bring hips to EOB and to lift trunk; assist for steadying as lifting trunk upright  Transfers Overall transfer level: Needs assistance Equipment used: Rolling walker (2 wheeled) Transfers: Sit to/from Stand Sit to Stand: Mod assist;+2 physical assistance Stand pivot transfers: +2 physical assistance;Mod assist       General transfer comment: lifting help up from bed, then from armchair.  Patient flexed forward and needed support for improved upright posture  Ambulation/Gait Ambulation/Gait assistance: Mod assist;+2 physical assistance Ambulation Distance (Feet): 6 Feet Assistive device: Rolling walker (2 wheeled) Gait Pattern/deviations: Step-to pattern;Decreased stride length;Trunk  flexed;Shuffle     General Gait Details: heavy use of RW and poor postural control with pain and reports flexed posture prior to this admission; HR up to 144 with ambulation so provided chair to rest, then pivot back to bed   Stairs            Wheelchair Mobility    Modified Rankin (Stroke Patients Only)       Balance Overall balance assessment: Needs assistance Sitting-balance support: Feet supported Sitting balance-Leahy Scale: Good     Standing balance support: Bilateral upper extremity supported Standing balance-Leahy Scale: Poor Standing balance comment: +2 with walker for static balance, initially posterior, and c/o if extends trunk too much could fall backwards                    Cognition Arousal/Alertness: Awake/alert Behavior During Therapy: WFL for tasks assessed/performed Overall Cognitive Status: Within Functional Limits for tasks assessed                      Exercises General Exercises - Lower Extremity Heel Slides: AAROM;Left;5 reps;Supine    General Comments        Pertinent Vitals/Pain Pain Assessment: 0-10 Pain Score: 6  Pain Location: bilateral LE's; L>R Pain Descriptors / Indicators: Aching;Discomfort;Sore Pain Intervention(s): Monitored during session;Limited activity within patient's tolerance;Premedicated before session    Home Living                      Prior Function            PT Goals (current goals can now be  found in the care plan section) Progress towards PT goals: Progressing toward goals    Frequency    Min 3X/week      PT Plan Current plan remains appropriate    Co-evaluation PT/OT/SLP Co-Evaluation/Treatment: Yes Reason for Co-Treatment: Complexity of the patient's impairments (multi-system involvement);For patient/therapist safety PT goals addressed during session: Mobility/safety with mobility;Proper use of DME       End of Session Equipment Utilized During Treatment: Gait  belt Activity Tolerance: Treatment limited secondary to medical complications (Comment) (elevated HR to 144) Patient left: in bed;with call bell/phone within reach;Other (comment) (sitting EOB eating breakfast)     Time: [id number] PT Time Calculation (min) (ACUTE ONLY): 29 min  Charges:  $Therapeutic Activity: 8-22 mins                    G Codes:      Elray McgregorCynthia Syvanna Ciolino 11/08/2015, 11:05 AM  Sheran Lawlessyndi Andrej Spagnoli, PT [phone] 11/08/2015

## 2015-11-08 NOTE — Care Management Note (Addendum)
Case Management Note  Patient Details  Name: [name] [name] MRN: [id number] Date of Birth: 06/18/1950  Subjective/Objective:  Post op day 6 for left fem pop, has osteo left foot. Patient 's grand daughter lives with her and she works every day.  Per pt eval rec snf.  CSW notified.  Patient is receiving hydrotherapy from physical therapy.  Previous plan was for [facility] but since then has been changed to SNF. Plan for left foot surgical debridement plantar ulcer with placement of infusion wound VAC on Friday morning                Action/Plan:   Expected Discharge Date:  11/02/15               Expected Discharge Plan:  Skilled Nursing Facility  In-House Referral:  Clinical Social Work  Discharge planning Services  CM Consult  Post Acute Care Choice:    Choice offered to:     DME Arranged:    DME Agency:     HH Arranged:    HH Agency:     Status of Service:  In process, will continue to follow  If discussed at Long Length of Stay Meetings, dates discussed:    Additional Comments:  Leone Haven, RN 11/08/2015, 3:22 PM

## 2015-11-08 NOTE — Progress Notes (Signed)
   VASCULAR SURGERY ASSESSMENT & PLAN:  6 Days Post-Op s/p: Left femoral to below knee popliteal artery bypass with vein. Excellent anterior tibial signal on the left with Doppler.  Dr. Lajoyce Corners plans surgery on her foot on Friday. Continue dressing changes and intravenous antibiotics.  SUBJECTIVE: No complaints  PHYSICAL EXAM: Vitals:   11/07/15 2239 11/07/15 2322 11/08/15 0255 11/08/15 0344  BP: (!) 155/69 (!) 148/63  (!) 140/48  Pulse: 87 84 82 90  Resp: 19 18 (!) 22 (!) 22  Temp: 98.1 F (36.7 C) 98.4 F (36.9 C)  98.4 F (36.9 C)  TempSrc: Oral Oral  Oral  SpO2: 98% 100% (!) 80% 95%  Weight: 159 lb 2.8 oz (72.2 kg)     Height:       Incisions look fine. Excellent ATA signal with doppler  LABS: Lab Results  Component Value Date   WBC 8.7 11/07/2015   HGB 8.0 (L) 11/07/2015   HCT 24.7 (L) 11/07/2015   MCV 88.8 11/07/2015   PLT 321 11/07/2015   Lab Results  Component Value Date   CREATININE 0.41 (L) 11/08/2015   Lab Results  Component Value Date   INR 1.21 11/04/2015   CBG (last 3)   Recent Labs  11/07/15 1157 11/07/15 1826 11/07/15 1930  GLUCAP 190* 122* 214*    Principal Problem:   Osteomyelitis of left foot (HCC) Active Problems:   Diabetes mellitus type II, uncontrolled (HCC)   Hyperlipidemia with target LDL less than 70   PVD (peripheral vascular disease) with claudication (HCC)   Essential hypertension   Malnutrition of moderate degree   Diabetic foot ulcer (HCC)   S/P femoral-popliteal bypass surgery   Septic shock (HCC)   Encephalopathy acute   Acute blood loss anemia   Acute respiratory failure with hypoxia (HCC)   CVA (cerebral vascular accident) [facility])   Central line infection, initial encounter    Cari Caraway Beeper: [phone] 11/08/2015

## 2015-11-08 NOTE — Progress Notes (Signed)
PROGRESS NOTE    [name] [name]  YQM:[id number] DOB: 1950/12/09 DOA: 10/30/2015 PCP: Georgann Housekeeper, MD   Brief Narrative:  65 y.o. WF PMHx DM Type 2 uncontrolled with, complications, PVD with recent revascularization, MI in 2011, HTN, CAD native artery, HLD, and Peripheral Neuropathy   Presenting with a diabetic foot infection.  Patient is diabetic and developed a wound on the side of her foot.  Went to the doctor, [facility] ([facility]).  2 weeks ago, developed a blister on the side of her foot and it burst.  Didn't have transportation to the doctor and so missed several appointments.  When she made it there today, they were concerned about these ulcers and sent her to the ER for further evaluation.  Diabetes for about 14 years.  On insulin. Sugars run a little high, 200 range.  Prior A1c was 13, but down to 7 at last check recently.  Used to Check glucose twice daily but not recently.  She was admitted from 8/31-09/29/15 for PAD and PVD with claudication; she had stenting of the distal left common femoral artery during that hospitalization.  She had previously had a staged left common femoral endarterectomy with profundoplasty on 09/05/15.   Subjective: 10/11 A/O 4, states left foot pain secondary to hydrotherapy this A.m. Negative SOB, negative CP. States to her knowledge on Friday surgery is to do another I&D of left foot not an amputation.     Assessment & Plan:   Principal Problem:   Osteomyelitis of left foot (HCC) Active Problems:   Diabetes mellitus type II, uncontrolled (HCC)   Hyperlipidemia with target LDL less than 70   PVD (peripheral vascular disease) with claudication (HCC)   Essential hypertension   Malnutrition of moderate degree   Diabetic foot ulcer (HCC)   S/P femoral-popliteal bypass surgery   Septic shock (HCC)   Encephalopathy acute   Acute blood loss anemia   Acute respiratory failure with hypoxia (HCC)   CVA (cerebral vascular  accident) (HCC)   Central line infection, initial encounter   Demand myocardial infarction   Surgery, elective   Traumatic hemorrhagic shock (HCC)   Chronic systolic CHF (congestive heart failure) (HCC)   Uncontrolled type 2 diabetes mellitus with complication (HCC)    Respiratory failure acute hypoxic.  -Failure due to decreased mentation: Resolved -Titrate O2 to maintain SPO2> 93%   Osteomyelitis due to diabetic foot ulcer with peripheral vascular disease.  -Continue current antibiotics therapy per surgery. -Pain controlled on current therapy -Daily hydrotherapy -Scheduled for further I&D on Friday 10/13  Hemorrhagic shock (due to bleeding on left thigh).  -Stable Recent Labs     11/06/15  1345  11/07/15  0447  11/08/15  0530  HGB  8.6*  8.0*  8.3*   Chronic systolic CHF (mild) -Strict I&O -Daily weight -Transfuse for hemoglobin<8 -See HTN   HTN.  -Metoprolol 25 mg BID -Start lisinopril 2.5 mg daily  MI - Patient hemodynamic stable, will continue antiplatelet therapy with clopidogrel/ asa and statin therapy. No active chest pain  Diabetes type 2 uncontrolled with complication.  -10/5 Hemoglobin A1c= 7.6  -Lantus 35 units daily -Moderate SSI  Acute change in mental status with possible right brain stem subcortical infarct.  -CTA negative for acute infarct.     DVT prophylaxis: non due to bleeding Code Status: full Family Communication:  daughter at the bedside Disposition Plan: home      Consultants:  Dr.Christopher Adele [facility]  surgery Dr Aldean Baker. [facility] Orthopedic surgery Dr Roselyn Reef Willodean Rosenthal. Neurology Cardiology [facility]     Procedures/Significant Events:  10/5 ; -Left femoral to below-knee popliteal artery bypass with non-reversed translocated left great saphenous vein.- Intraoperative arteriogram 10/6:ABI completed: Left posterior tibial and dorsalis pedis pressures were not obtained due to patient pain tolerance,  recent surgery, and surgical dressings. Right ABI of 0.28 is suggestive of critical arterial occlusive disease at rest. Left TBI of 0.20 is suggestive of abnormal arterial flow into the great toe at rest. 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 - No evidence of acute infarct.-Chronically decreased number of distal right MCA branch vessels.No evidence of acute major branch occlusion. -Severe right V4 vertebral artery stenosis. -60-65% stenoses of the brachiocephalic and subclavian arteries. -Interlobular septal thickening and ground-glass opacities in lung apices, possibly reflecting edema.  10/11 echocardiogram:- LVEF = 50% to 55%. Akinesis of the basal-midinferoseptal myocardium. -(grade 1 diastolic dysfunction). -Left atrium: The atrium was mildly dilated.     Cultures 10/3 MRSA by PCR negative 10/6 Blood left hand/wrist negative 10/6 urine 2 negative   Antimicrobials: Aztreonam 10/3>> 10/4 Cefepime 10/6>> Ceftriaxone 10/41 dose Vancomycin 10/3>>    Devices    LINES / TUBES:      Continuous Infusions: . sodium chloride 10 mL/hr at 11/07/15 1827     Objective: Vitals:   11/08/15 1515 11/08/15 1917 11/09/15 0500 11/09/15 0531  BP: (!) 148/55 135/64  140/60  Pulse: 91 97  87  Resp: (!) 25 18  18   Temp: 97.7 F (36.5 C) 98.3 F (36.8 C)  98.6 F (37 C)  TempSrc: Oral Oral  Oral  SpO2: 97% 95%  97%  Weight:  73.6 kg (162 lb 4.1 oz) 72 kg (158 lb 12.8 oz)   Height:  5\' 4"  (1.626 m)      Intake/Output Summary (Last 24 hours) at 11/09/15 9604 Last data filed at 11/09/15 0150  Gross per 24 hour  Intake              120 ml  Output             2275 ml  Net            -2155 ml   Filed Weights   11/07/15 2239 11/08/15 1917 11/09/15 0500  Weight: 72.2 kg (159 lb 2.8 oz) 73.6 kg (162 lb 4.1 oz) 72 kg (158 lb 12.8 oz)    Examination:  General: A/O 4, left foot pain, No acute  respiratory distress Eyes: negative scleral hemorrhage, negative anisocoria, negative icterus ENT: Negative Runny nose, negative gingival bleeding, Neck:  Negative scars, masses, torticollis, lymphadenopathy, JVD Lungs: Clear to auscultation bilaterally without wheezes or crackles Cardiovascular: Regular rate and rhythm without murmur gallop or rub normal S1 and S2 Abdomen: negative abdominal pain, nondistended, positive soft, bowel sounds, no rebound, no ascites, no appreciable mass Extremities: left foot wrapped in gauze with what appears to be wet-to-dry dressing. Did not take down secondary to hydrotherapy just being performed this A.m. will evaluate foot in the A.m. during hydrotherapy warm to touch Skin: Right IJ CVL covered and clean negative sign of infection Psychiatric:  Negative depression, negative anxiety, negative fatigue, negative mania  Central nervous system:  Cranial nerves II through XII intact, tongue/uvula midline, all extremities muscle strength 5/5, sensation intact throughout, negative dysarthria, negative expressive aphasia, negative receptive aphasia.  .     Data Reviewed: Care during the described time  interval was provided by me .  I have reviewed this patient's available data, including medical history, events of note, physical examination, and all test results as part of my evaluation. I have personally reviewed and interpreted all radiology studies.  CBC:  Recent Labs Lab 11/05/15 1008 11/05/15 2236 11/06/15 1345 11/07/15 0447 11/08/15 0530  WBC 12.6* 10.8* 9.6 8.7 11.8*  NEUTROABS  --   --   --   --  8.5*  HGB 8.3* 7.8* 8.6* 8.0* 8.3*  HCT 25.4* 24.3* 26.3* 24.7* 25.0*  MCV 87.9 88.0 88.6 88.8 85.9  PLT 272 276 325 321 448*   Basic Metabolic Panel:  Recent Labs Lab 11/02/15 2202  11/03/15 0416 11/03/15 1612 11/05/15 0458 11/06/15 0600 11/07/15 0447 11/08/15 0530  NA  --   --  139 137 140  --  140 140  K  --   --  4.2 4.3 3.3*  --  3.4*  3.1*  CL  --   --  108 109 105  --  106 105  CO2  --   --  25 20* 27  --  27 27  GLUCOSE  --   --  81 114* 96  --  64* 51*  BUN  --   --  12 14 9   --  <5* 5*  CREATININE  --   < > 0.62 0.64 0.47 0.43* 0.43* 0.41*  CALCIUM  --   --  8.1* 7.8* 8.1*  --  8.2* 8.5*  MG 1.2*  --   --   --   --   --   --   --   < > = values in this interval not displayed. GFR: Estimated Creatinine Clearance: 68.2 mL/min (by C-G formula based on SCr of 0.41 mg/dL (L)). Liver Function Tests:  Recent Labs Lab 11/03/15 1612 11/05/15 0458 11/07/15 0447  AST 31 27 22   ALT 13* 12* 15  ALKPHOS 85 74 85  BILITOT 0.7 0.4 0.6  PROT 4.8* 5.0* 5.6*  ALBUMIN 1.6* 1.4* 1.5*   No results for input(s): LIPASE, AMYLASE in the last 168 hours. No results for input(s): AMMONIA in the last 168 hours. Coagulation Profile:  Recent Labs Lab 11/03/15 2143 11/04/15 0347  INR 1.22 1.21   Cardiac Enzymes:  Recent Labs Lab 11/03/15 1612 11/03/15 2223 11/04/15 0347 11/04/15 1036  CKTOTAL 604*  --   --   --   TROPONINI 0.14* 1.77* 3.83* 2.32*   BNP (last 3 results) No results for input(s): PROBNP in the last 8760 hours. HbA1C: No results for input(s): HGBA1C in the last 72 hours. CBG:  Recent Labs Lab 11/08/15 0751 11/08/15 0841 11/08/15 1122 11/08/15 1704 11/08/15 2208  GLUCAP 51* 107* 143* 137* 259*   Lipid Profile: No results for input(s): CHOL, HDL, LDLCALC, TRIG, CHOLHDL, LDLDIRECT in the last 72 hours. Thyroid Function Tests: No results for input(s): TSH, T4TOTAL, FREET4, T3FREE, THYROIDAB in the last 72 hours. Anemia Panel: No results for input(s): VITAMINB12, FOLATE, FERRITIN, TIBC, IRON, RETICCTPCT in the last 72 hours. Urine analysis:    Component Value Date/Time   COLORURINE YELLOW 09/05/2015 0641   APPEARANCEUR CLEAR 09/05/2015 0641   LABSPEC 1.013 09/05/2015 0641   PHURINE 6.0 09/05/2015 0641   GLUCOSEU 100 (A) 09/05/2015 0641   HGBUR TRACE (A) 09/05/2015 0641   BILIRUBINUR NEGATIVE  09/05/2015 0641   KETONESUR NEGATIVE 09/05/2015 0641   PROTEINUR 100 (A) 09/05/2015 0641   UROBILINOGEN 1.0 10/16/2009 0243   NITRITE NEGATIVE 09/05/2015  [id number]   LEUKOCYTESUR NEGATIVE 09/05/2015 0641   Sepsis Labs: @LABRCNTIP (procalcitonin:4,lacticidven:4)  ) Recent Results (from the past 240 hour(s))  Surgical pcr screen     Status: None   Collection Time: 10/31/15  2:02 AM  Result Value Ref Range Status   MRSA, PCR NEGATIVE NEGATIVE Final   Staphylococcus aureus NEGATIVE NEGATIVE Final    Comment:        The Xpert SA Assay (FDA approved for NASAL specimens in patients over 65 years of age), is one component of a comprehensive surveillance program.  Test performance has been validated by [facility] for patients greater than or equal to 65 year old. It is not intended to diagnose infection nor to guide or monitor treatment.   Culture, blood (routine x 2)     Status: None   Collection Time: 11/03/15  4:14 PM  Result Value Ref Range Status   Specimen Description BLOOD LEFT HAND  Final   Special Requests IN PEDIATRIC BOTTLE 2CC  Final   Culture NO GROWTH 5 DAYS  Final   Report Status 11/08/2015 FINAL  Final  Culture, blood (routine x 2)     Status: None   Collection Time: 11/03/15  4:17 PM  Result Value Ref Range Status   Specimen Description BLOOD LEFT WRIST  Final   Special Requests BOTTLES DRAWN AEROBIC ONLY 5CC  Final   Culture NO GROWTH 5 DAYS  Final   Report Status 11/08/2015 FINAL  Final  Urine culture     Status: None   Collection Time: 11/03/15  4:17 PM  Result Value Ref Range Status   Specimen Description URINE, RANDOM  Final   Special Requests NONE  Final   Culture NO GROWTH  Final   Report Status 11/05/2015 FINAL  Final  Urine culture     Status: None   Collection Time: 11/03/15  7:04 PM  Result Value Ref Range Status   Specimen Description URINE, CATHETERIZED  Final   Special Requests NONE  Final   Culture NO GROWTH  Final   Report Status 11/04/2015  FINAL  Final  C difficile quick scan w PCR reflex     Status: None   Collection Time: 11/05/15  4:21 PM  Result Value Ref Range Status   C Diff antigen NEGATIVE NEGATIVE Final   C Diff toxin NEGATIVE NEGATIVE Final   C Diff interpretation No C. difficile detected.  Final         Radiology Studies: No results found.      Scheduled Meds: . aspirin EC  81 mg Oral Daily  . atorvastatin  80 mg Oral Daily  . ceFEPime (MAXIPIME) IV  2 g Intravenous Q12H  . clopidogrel  75 mg Oral Daily  . collagenase   Topical Daily  . diphenhydrAMINE  50 mg Oral Once  . feeding supplement (PRO-STAT SUGAR FREE 64)  30 mL Oral BID  . gabapentin  300 mg Oral TID  . insulin aspart  0-15 Units Subcutaneous TID WC  . insulin glargine  35 Units Subcutaneous Q2200  . lisinopril  2.5 mg Oral Daily  . metoprolol tartrate  50 mg Oral BID  . pantoprazole  40 mg Oral Q1200  . polyethylene glycol  17 g Oral Daily  . senna-docusate  1 tablet Oral QHS  . vancomycin  1,000 mg Intravenous Q12H   Continuous Infusions: . sodium chloride 10 mL/hr at 11/07/15 1827     LOS: 10 days    Time spent: 40 minutes  Drema Dallas, MD [facility] Pager [phone]   If 7PM-7AM, please contact night-coverage [email or web address] Password [facility] 11/09/2015, 7:27 AM

## 2015-11-08 NOTE — Progress Notes (Signed)
Occupational Therapy Treatment Patient Details Name: [name] Ageonie N Pickford MRN: [id number] DOB: [birth year] Today's Date: 11/08/2015    History of present illness Pt adm with lt plantar abscess and underwent revascularization on 10/5. On 10/6 acutely developed slurred speech, L sided weakness, and altered MS. CT negative but neurology consulted and felt it was right brain  Small subcortical infarct not seen on CT. Pt PMH - DM, PVD, PAD, MI, HTN, CAD, arthritis,   OT comments  This 65 yo female admitted with above presents to acute OT making progress with transfers and will continue to benefit from acute OT with follow up OT at SNF. We will continue to follow.  Follow Up Recommendations  SNF    Equipment Recommendations  3 in 1 bedside comode;Tub/shower bench       Precautions / Restrictions Precautions Precautions: Fall Precaution Comments: watch HR (from 100's to 140's today) Restrictions Weight Bearing Restrictions: No       Mobility Bed Mobility Overal bed mobility: Needs Assistance Bed Mobility: Supine to Sit     Supine to sit: Min assist;HOB elevated     General bed mobility comments: increased time, cues and heavy use of bed rail; extra time/effort to bring hips to EOB and to lift trunk; assist for steadying as lifting trunk upright  Transfers Overall transfer level: Needs assistance Equipment used: Rolling walker (2 wheeled) Transfers: Sit to/from [employer]Stand;Stand Pivot Transfers Sit to Stand: Mod assist;+2 physical assistance Stand pivot transfers: +2 physical assistance;Mod assist       General transfer comment: lifting help up from bed, then from armchair.  Patient flexed forward and needed support for improved upright posture    Balance Overall balance assessment: Needs assistance Sitting-balance support: Feet supported;No upper extremity supported Sitting balance-Leahy Scale: Good     Standing balance support: Bilateral upper extremity supported Standing  balance-Leahy Scale: Poor Standing balance comment: + with RW for static balance with initial posterior lean with c/o if she extends her trunk too much she feels like she will fall backwards                   ADL Overall ADL's : Needs assistance/impaired     Grooming: Wash/dry face;Set up;Sitting                   Toilet Transfer: Moderate assistance;+2 for physical assistance;Ambulation;RW (forward away from side of bed, recliner behind her to sit)                           Cognition   Behavior During Therapy: [facility] for tasks assessed/performed Overall Cognitive Status: Within Functional Limits for tasks assessed                                    Pertinent Vitals/ Pain       Pain Assessment: 0-10 Pain Score: 6  Pain Location: Bil LEs L>R Pain Descriptors / Indicators: Aching;Discomfort;Sore Pain Intervention(s): Monitored during session;Limited activity within patient's tolerance;Premedicated before session;Repositioned         Frequency  Min 2X/week        Progress Toward Goals  OT Goals(current goals can now be found in the care plan section)  Progress towards OT goals: Progressing toward goals (used RW today not Stedy)     Plan Discharge plan remains appropriate    Co-evaluation    PT/OT/SLP Co-Evaluation/Treatment: Yes  Reason for Co-Treatment: Complexity of the patient's impairments (multi-system involvement);For patient/therapist safety PT goals addressed during session: Mobility/safety with mobility;Proper use of DME OT goals addressed during session: ADL's and self-care;Strengthening/ROM      End of Session Equipment Utilized During Treatment: Gait belt;Rolling walker   Activity Tolerance Patient limited by fatigue;Patient limited by pain   Patient Left with call bell/phone within reach (sitting EOB eating her breakfast)   Nurse Communication          Time: 2257-5051 OT Time Calculation (min): 28  min  Charges: OT General Charges $OT Visit: 1 Procedure OT Treatments $Self Care/Home Management : 8-22 mins  Evette Georges [phone] 11/08/2015, 11:14 AM

## 2015-11-08 NOTE — Progress Notes (Addendum)
Patient Name: [name] [name] Date of Encounter: 11/08/2015  Primary Cardiologist: Dr. Berry/[facility]  Hospital Problem List     Principal Problem:   Osteomyelitis of left foot [facility](HCC) Active Problems:   Demand myocardial infarction   Diabetes mellitus type II, uncontrolled (HCC)   Hyperlipidemia with target LDL less than 70   PVD (peripheral vascular disease) with claudication (HCC)   Essential hypertension   Malnutrition of moderate degree   Diabetic foot ulcer (HCC)   S/P femoral-popliteal bypass surgery   Septic shock (HCC)   Encephalopathy acute   Acute blood loss anemia   Acute respiratory failure with hypoxia (HCC)   CVA (cerebral vascular accident) (HCC)   Central line infection, initial encounter     Subjective   Feels well, denies chest pain and SOB.   Inpatient Medications    Scheduled Meds: . aspirin EC  81 mg Oral Daily  . atorvastatin  80 mg Oral Daily  . ceFEPime (MAXIPIME) IV  2 g Intravenous Q12H  . clopidogrel  75 mg Oral Daily  . collagenase   Topical Daily  . feeding supplement (PRO-STAT SUGAR FREE 64)  30 mL Oral BID  . gabapentin  300 mg Oral TID  . insulin aspart  0-15 Units Subcutaneous TID WC  . insulin glargine  35 Units Subcutaneous Q2200  . lisinopril  2.5 mg Oral Daily  . metoprolol tartrate  50 mg Oral BID  . pantoprazole  40 mg Oral Q1200  . polyethylene glycol  17 g Oral Daily  . senna-docusate  1 tablet Oral QHS  . vancomycin  1,000 mg Intravenous Q12H   Continuous Infusions: . sodium chloride 10 mL/hr at 11/07/15 1827   PRN Meds:.acetaminophen **OR** acetaminophen, alum & mag hydroxide-simeth, bisacodyl, fentaNYL (SUBLIMAZE) injection, guaiFENesin-dextromethorphan, magnesium hydroxide, ondansetron, phenol   Vital Signs    Vitals:   11/08/15 0255 11/08/15 0344 11/08/15 0700 11/08/15 1100  BP:  (!) 140/48 (!) 156/68 (!) 154/78  Pulse: 82 90 (!) 103 (!) 107  Resp: (!) 22 (!) 22 (!) 27 (!) 21  Temp:  98.4 F (36.9 C) 98.4 F  (36.9 C) 98.1 F (36.7 C)  TempSrc:  Oral Oral Oral  SpO2: (!) 80% 95% 97% 96%  Weight:      Height:        Intake/Output Summary (Last 24 hours) at 11/08/15 1413 Last data filed at 11/08/15 1410  Gross per 24 hour  Intake              610 ml  Output             2050 ml  Net            -1440 ml   Filed Weights   11/03/15 1621 11/04/15 0600 11/07/15 2239  Weight: 73.3 kg (161 lb 9.6 oz) 78.4 kg (172 lb 13.5 oz) 72.2 kg (159 lb 2.8 oz)    Physical Exam   GEN: Well nourished, well developed, in no acute distress.  HEENT: Grossly normal.  Neck: Supple, no JVD, carotid bruits, or masses. Cardiac: RRR, no murmurs, rubs, or gallops. No clubbing, cyanosis, edema.  Radials/DP/PT 2+ and equal bilaterally.  Respiratory:  Respirations regular and unlabored, clear to auscultation bilaterally. GI: Soft, nontender, nondistended, BS + x 4. MS: no deformity or atrophy, left foot dressing in place. Skin: warm and dry, no rash. Neuro:  Strength and sensation are intact. Psych: AAOx3.  Normal affect.  Labs    CBC  Recent Labs  11/07/15 0447 11/08/15 0530  WBC 8.7 11.8*  NEUTROABS  --  8.5*  HGB 8.0* 8.3*  HCT 24.7* 25.0*  MCV 88.8 85.9  PLT 321 448*   Basic Metabolic Panel  Recent Labs  11/07/15 0447 11/08/15 0530  NA 140 140  K 3.4* 3.1*  CL 106 105  CO2 27 27  GLUCOSE 64* 51*  BUN <5* 5*  CREATININE 0.43* 0.41*  CALCIUM 8.2* 8.5*   Liver Function Tests  Recent Labs  11/07/15 0447  AST 22  ALT 15  ALKPHOS 85  BILITOT 0.6  PROT 5.6*  ALBUMIN 1.5*     Telemetry    NSR- Personally Reviewed  ECG    NSR - Personally Reviewed  Radiology    No results found.   Cardiac Studies   2-D Echocardiogram:  Left ventricle: The cavity size was normal. Wall thickness was   normal. Systolic function was normal. The estimated ejection   fraction was in the range of 50% to 55%. There is akinesis of the   basal-midinferoseptal myocardium. Doppler parameters  are   consistent with abnormal left ventricular relaxation (grade 1   diastolic dysfunction). - Mitral valve: Calcified annulus. Mildly thickened leaflets . - Left atrium: The atrium was mildly dilated.  Impressions:  - No cardiac source of emboli was indentified.   Patient Profile     [name] [name] is a 65 year old female with a past medical history of HTN, HLD, DM, PAD. Also with history of CAD and STEMI in 2011. She presented to the ED on 10/30/15 [facility] for a left foot infection. Developed septic and hypovolemic shock, found to have elevated troponin so Cardiology was consulted.   Assessment & Plan   1. Elevated troponin with history of CAD / DEMAND INFARCTION: Troponin elevated in the setting of hypovolemia however, patient has a history of STEMI in 2011 and multiple risk factors for CAD. Unable to assess accurately if she experienced any angina as she was intubated. She denies chest pain currently.  Echocardiogram performed. See above. Inferior wall motion abnormality is consistent with known prior infarct.  Dr. Lajoyce Corners plans for surgery on Friday on her left foot. Ok from cardiac standpoint to proceed with surgery and we can do outpatient stress test after she recovers as she is not having any ischemic symptoms currently.   2. History of PAD s/p fem-pop: Stable, management by Vascular surgery.   3. HTN: Well controlled. Metoprolol was started back yesterday.   4. HLD: Continue high intensity statin. LDL is 44.   5. Osteomyelitis of left foot: Dr. Lajoyce Corners following and ID.     Signed, Little Ishikawa, NP  11/08/2015, 2:13 PM    I have seen, examined and evaluated the patient this pm along with Suzzette Righter, NP.  After reviewing all the available data and chart, we discussed the patients laboratory, study & physical findings as well as symptoms in detail. I agree with her findings, examination as well as impression  recommendations as per our discussion.    Echocardiogram reviewed shows persistent inferior wall motion abnormality with preserved ejection fraction. This would suggest that her troponin elevation is probably more related to demand ischemia/demand infarction and not true ACS. This was in the setting of hemorrhagic shock and associated renal insufficiency. In this situation, the heart is essentially an innocent bystander.   With active osteomyelitis, it would seem that surgical debridement is relatively urgent if not  emergent to avoid sepsis and further complications. With a normal echocardiogram and no active anginal symptoms, I would not recommend further cardiac evaluation prior to going for surgery.  Data was suggested in the absence of active anginal symptoms or active heart failure, the patient would do better going forward with surgery and dealing with, occasions of the curve as opposed to investigate for ischemia and then potentially delaying necessary surgery for intervention.   I have titrated up her beta blocker dose for her now increased blood pressure and heart rate. The stable blood pressure dosing also provides mitigation for perioperative risk.     Revised Cardiac Risk Index Score is 2. - This is based on her insulin-dependent diabetes and history of coronary artery disease. Neither of these are able to be altered. Based on this risk, calculated risk of major cardiac events would be 6.6%. This is reduced by over one half in the setting of ongoing beta blocker therapy.  M.D./PA time with patient roughly 1 hour. Greater than 50% of which was spent directly discussed with the patient and her family the risks and benefits of proceeding with the surgery versus not. I think her heart with a stable echocardiogram should be fine with what is a relatively low risk surgery from a cardiac standpoint. The other side of this is that the risk of not doing the surgery is also very high.  We will  continue to be available for assistance if necessary perioperatively.   Bryan Lemma, M.D., M.S. Interventional Cardiologist   Pager # [phone] Phone # [phone] [address] [place], [place] [address]

## 2015-11-09 ENCOUNTER — Encounter: Payer: Self-pay | Admitting: Vascular Surgery

## 2015-11-09 ENCOUNTER — Other Ambulatory Visit (HOSPITAL_COMMUNITY): Payer: Self-pay | Admitting: Family

## 2015-11-09 DIAGNOSIS — E118 Type 2 diabetes mellitus with unspecified complications: Secondary | ICD-10-CM

## 2015-11-09 DIAGNOSIS — E1165 Type 2 diabetes mellitus with hyperglycemia: Secondary | ICD-10-CM | POA: Diagnosis present

## 2015-11-09 DIAGNOSIS — I5022 Chronic systolic (congestive) heart failure: Secondary | ICD-10-CM | POA: Diagnosis present

## 2015-11-09 DIAGNOSIS — T794XXA Traumatic shock, initial encounter: Secondary | ICD-10-CM | POA: Diagnosis present

## 2015-11-09 DIAGNOSIS — L97423 Non-pressure chronic ulcer of left heel and midfoot with necrosis of muscle: Secondary | ICD-10-CM

## 2015-11-09 DIAGNOSIS — M86172 Other acute osteomyelitis, left ankle and foot: Secondary | ICD-10-CM

## 2015-11-09 DIAGNOSIS — IMO0002 Reserved for concepts with insufficient information to code with codable children: Secondary | ICD-10-CM | POA: Diagnosis present

## 2015-11-09 DIAGNOSIS — L02612 Cutaneous abscess of left foot: Secondary | ICD-10-CM

## 2015-11-09 LAB — BASIC METABOLIC PANEL
Anion gap: 7 (ref 5–15)
BUN: 9 mg/dL (ref 4–21)
BUN: 9 mg/dL (ref 6–20)
CALCIUM: 8.5 mg/dL — AB (ref 8.9–10.3)
CO2: 26 mmol/L (ref 22–32)
CREATININE: 0.51 mg/dL (ref 0.44–1.00)
Chloride: 105 mmol/L (ref 101–111)
Creatinine: 0.5 mg/dL (ref 0.5–1.1)
GFR calc Af Amer: >60 mL/min (ref >60)
GLUCOSE: 167 mg/dL
Glucose, Bld: 167 mg/dL — ABNORMAL HIGH (ref 65–99)
POTASSIUM: 3.9 mmol/L (ref 3.5–5.1)
Potassium: 3.9 mmol/L (ref 3.4–5.3)
SODIUM: 138 mmol/L (ref 137–147)
SODIUM: 138 mmol/L (ref 135–145)

## 2015-11-09 LAB — GLUCOSE, CAPILLARY
GLUCOSE-CAPILLARY: 140 mg/dL — AB (ref 65–99)
GLUCOSE-CAPILLARY: 162 mg/dL — AB (ref 65–99)
Glucose-Capillary: 165 mg/dL — ABNORMAL HIGH (ref 65–99)
Glucose-Capillary: 208 mg/dL — ABNORMAL HIGH (ref 65–99)

## 2015-11-09 LAB — CBC
HCT: 26.4 % — ABNORMAL LOW (ref 36.0–46.0)
Hemoglobin: 8.7 g/dL — ABNORMAL LOW (ref 12.0–15.0)
MCH: 28.7 pg (ref 26.0–34.0)
MCHC: 33 g/dL (ref 30.0–36.0)
MCV: 87.1 fL (ref 78.0–100.0)
PLATELETS: 414 10*3/uL — AB (ref 150–400)
RBC: 3.03 MIL/uL — AB (ref 3.87–5.11)
RDW: 15.3 % (ref 11.5–15.5)
WBC: 12.2 10*3/uL — ABNORMAL HIGH (ref 4.0–10.5)

## 2015-11-09 LAB — CBC AND DIFFERENTIAL
HEMATOCRIT: 26 % — AB (ref 36–46)
HEMOGLOBIN: 8.7 g/dL — AB (ref 12.0–16.0)
PLATELETS: 414 10*3/uL — AB (ref 150–399)
WBC: 12.2 10^3/mL

## 2015-11-09 MED ORDER — CLINDAMYCIN PHOSPHATE 900 MG/50ML IV SOLN
900.0000 mg | INTRAVENOUS | Status: AC
  Administered 2015-11-10 (×2): 900 mg via INTRAVENOUS
  Filled 2015-11-09 (×2): qty 50

## 2015-11-09 MED ORDER — SODIUM CHLORIDE 0.9% FLUSH
10.0000 mL | INTRAVENOUS | Status: DC | PRN
  Administered 2015-11-10 (×2): 10 mL
  Administered 2015-11-11 – 2015-11-12 (×2): 20 mL
  Filled 2015-11-09 (×4): qty 40

## 2015-11-09 MED ORDER — FENTANYL CITRATE (PF) 100 MCG/2ML IJ SOLN
25.0000 ug | INTRAMUSCULAR | Status: DC | PRN
Start: 2015-11-09 — End: 2015-11-14
  Administered 2015-11-09 – 2015-11-14 (×12): 50 ug via INTRAVENOUS
  Filled 2015-11-09 (×13): qty 2

## 2015-11-09 MED ORDER — FENTANYL CITRATE (PF) 100 MCG/2ML IJ SOLN
50.0000 ug | Freq: Once | INTRAMUSCULAR | Status: AC
  Administered 2015-11-09: 50 ug via INTRAVENOUS
  Filled 2015-11-09: qty 2

## 2015-11-09 MED ORDER — COLLAGENASE 250 UNIT/GM EX OINT: TOPICAL_OINTMENT | Freq: Every day | CUTANEOUS | 0 refills | Status: DC

## 2015-11-09 MED ORDER — CLINDAMYCIN PHOSPHATE 900 MG/50ML IV SOLN: 900.0000 mg | INTRAVENOUS | Status: DC

## 2015-11-09 MED ORDER — SODIUM CHLORIDE 0.45 % IV SOLN
INTRAVENOUS | Status: DC
  Administered 2015-11-09 – 2015-11-10 (×2): via INTRAVENOUS

## 2015-11-09 MED ORDER — CHLORHEXIDINE GLUCONATE 4 % EX LIQD
60.0000 mL | Freq: Once | CUTANEOUS | Status: AC
  Administered 2015-11-10: 4 via TOPICAL
  Filled 2015-11-09: qty 60

## 2015-11-09 MED ORDER — COLLAGENASE 250 UNIT/GM EX OINT: 1.0000 "application " | TOPICAL_OINTMENT | Freq: Every day | CUTANEOUS | 0 refills | Status: DC

## 2015-11-09 NOTE — Progress Notes (Signed)
Physical Therapy Wound Treatment Patient Details  Name: [name] [name] MRN: [id number] Date of Birth: 1950/12/12  Today's Date: 11/09/2015 Time: 5573-2202 Time Calculation (min): 27 min  Subjective  Subjective: Pt pleasant and agreeable to therapy Patient and Family Stated Goals: Heal wounds, decrease pain Date of Onset:  (Unknown) Prior Treatments: None noted. Now s/p revascularization LLE.  Pain Score:    Wound Assessment  Wound / Incision (Open or Dehisced) 10/30/15 Diabetic ulcer Foot Left;Mid (Active)  Dressing Type ABD;Gauze (Comment);Moist to dry 11/09/2015 10:31 AM  Dressing Changed Changed 11/09/2015 10:31 AM  Dressing Status Clean;Dry;Intact 11/09/2015 10:31 AM  Dressing Change Frequency Daily 11/09/2015 10:31 AM  Site / Wound Assessment Black;Yellow;Pink 11/09/2015 10:31 AM  % Wound base Red or Granulating 10% 11/09/2015 10:31 AM  % Wound base Yellow 75% 11/09/2015 10:31 AM  % Wound base Black 15% 11/09/2015 10:31 AM  % Wound base Other (Comment) 0% 11/09/2015 10:31 AM  Peri-wound Assessment Pink;Denuded;Erythema (blanchable) 11/09/2015 10:31 AM  Wound Length (cm) 2.5 cm 11/04/2015  9:52 AM  Wound Width (cm) 2.75 cm 11/04/2015  9:52 AM  Margins Unattached edges (unapproximated) 11/09/2015 10:31 AM  Closure None 11/09/2015 10:31 AM  Drainage Amount Moderate 11/09/2015 10:31 AM  Drainage Description Serosanguineous 11/09/2015 10:31 AM  Non-staged Wound Description Full thickness 11/09/2015 10:31 AM  Treatment Debridement (Selective);Hydrotherapy (Pulse lavage);Packing (Saline gauze) 11/09/2015 10:31 AM   Santyl applied to wound bed prior to applying dressing.     Hydrotherapy Pulsed lavage therapy - wound location: Left foot plantar surface and lateral surface Pulsed Lavage with Suction (psi): 12 psi Pulsed Lavage with Suction - Normal Saline Used: 1000 mL Pulsed Lavage Tip: Tip with splash shield Selective Debridement Selective Debridement - Location: Left foot  plantar surface and lateral surface Selective Debridement - Tools Used: Forceps;Scissors Selective Debridement - Tissue Removed: yellow and black necrotic tissue   Wound Assessment and Plan  Wound Therapy - Assess/Plan/Recommendations Wound Therapy - Clinical Statement: Continuing with hydrotherapy for removal of necrotic tissues. Spoke with Dr. Sharol Given via telephone on 11/09/15 and he gave verbal permission to add lateral foot wounds to hydrotherapy order.  Wound Therapy - Functional Problem List: Wounds impacting mobility and gait Factors Delaying/Impairing Wound Healing: Diabetes Mellitus;Vascular compromise Hydrotherapy Plan: Debridement;Dressing change;Patient/family education;Pulsatile lavage with suction Wound Therapy - Frequency: 6X / week Wound Therapy - Current Recommendations: PT Wound Therapy - Follow Up Recommendations: Home health RN Wound Plan: See above  Wound Therapy Goals- Improve the function of patient's integumentary system by progressing the wound(s) through the phases of wound healing (inflammation - proliferation - remodeling) by: Decrease Necrotic Tissue to: 50% Decrease Necrotic Tissue - Progress: Progressing toward goal Increase Granulation Tissue to: 50% Increase Granulation Tissue - Progress: Progressing toward goal Improve Drainage Characteristics: Min;Serous Improve Drainage Characteristics - Progress: Progressing toward goal Patient/Family will be able to : Understand pressure relief for wound healing and dressing changes. Goals/treatment plan/discharge plan were made with and agreed upon by patient/family: Yes Time For Goal Achievement: 2 weeks Wound Therapy - Potential for Goals: Good  Goals will be updated until maximal potential achieved or discharge criteria met.  Discharge criteria: when goals achieved, discharge from hospital, MD decision/surgical intervention, no progress towards goals, refusal/missing three consecutive treatments without notification  or medical reason.  GP     Rolinda Roan 11/09/2015, 10:39 AM   Rolinda Roan, PT, DPT Acute Rehabilitation Services Pager: [phone]

## 2015-11-09 NOTE — Progress Notes (Signed)
  Progress Note    11/09/2015 10:25 AM 7 Days Post-Op  Subjective: feels good this a.m.  Vitals:   11/09/15 0531 11/09/15 0931  BP: 140/60 (!) 164/69  Pulse: 87 99  Resp: 18   Temp: 98.6 F (37 C)     Physical Exam: Incision cdi Multiphasic signal AT Foot dressing cdi  CBC    Component Value Date/Time   WBC 11.8 (H) 11/08/2015 0530   RBC 2.91 (L) 11/08/2015 0530   HGB 8.3 (L) 11/08/2015 0530   HCT 25.0 (L) 11/08/2015 0530   PLT 448 (H) 11/08/2015 0530   MCV 85.9 11/08/2015 0530   MCH 28.5 11/08/2015 0530   MCHC 33.2 11/08/2015 0530   RDW 15.5 11/08/2015 0530   LYMPHSABS 1.8 11/08/2015 0530   MONOABS 1.2 (H) 11/08/2015 0530   EOSABS 0.3 11/08/2015 0530   BASOSABS 0.0 11/08/2015 0530    BMET    Component Value Date/Time   NA 140 11/08/2015 0530   K 3.1 (L) 11/08/2015 0530   CL 105 11/08/2015 0530   CO2 27 11/08/2015 0530   GLUCOSE 51 (L) 11/08/2015 0530   BUN 5 (L) 11/08/2015 0530   CREATININE 0.41 (L) 11/08/2015 0530   CREATININE 0.58 09/19/2015 1305   CALCIUM 8.5 (L) 11/08/2015 0530   GFRNONAA >60 11/08/2015 0530   GFRAA >60 11/08/2015 0530    INR    Component Value Date/Time   INR 1.21 11/04/2015 0347     Intake/Output Summary (Last 24 hours) at 11/09/15 1025 Last data filed at 11/09/15 1000  Gross per 24 hour  Intake              120 ml  Output             2775 ml  Net            -2655 ml     Assessment:  65 y.o. female is s/p L fem-bk pop with vein bypass.  Plan: Continue asa/plavix/statin Ok for Ortho procedure from vascular standpoint Will require rehab on discharge   Win Guajardo C. Randie Heinz, MD [facility] Office: [phone] Pager: [phone]  11/09/2015 10:25 AM

## 2015-11-09 NOTE — Progress Notes (Signed)
Spoke with Dr. Lajoyce Corners who gave verbal order to d/c NPO at midnight order

## 2015-11-09 NOTE — NC FL2 (Signed)
[place] MEDICAID FL2 LEVEL OF CARE SCREENING TOOL     IDENTIFICATION  Patient Name: [name] [name] Birthdate: [birth year] Sex: female Admission Date (Current Location): 10/30/2015  [facility] and [place] Number:  Producer, television/film/video and Address:  [facility] [place]. [facility], [address] [place], [place] [address]      Provider Number: [id number]  Attending Physician Name and Address:  Tyrone Nine, MD  Relative Name and Phone Number:  Leotis Shames, daughter, [phone]    Current Level of Care: Hospital Recommended Level of Care: Skilled Nursing Facility Prior Approval Number:    Date Approved/Denied:   PASRR Number: [id number]  Discharge Plan: SNF    Current Diagnoses: Patient Active Problem List   Diagnosis Date Noted  . Traumatic hemorrhagic shock (HCC)   . Chronic systolic CHF (congestive heart failure) (HCC)   . Uncontrolled type 2 diabetes mellitus with complication (HCC)   . Demand myocardial infarction 11/08/2015  . Surgery, elective   . Central line infection, initial encounter   . Septic shock (HCC) 11/03/2015  . Encephalopathy acute 11/03/2015  . Acute blood loss anemia 11/03/2015  . Acute respiratory failure with hypoxia (HCC)   . CVA (cerebral vascular accident) (HCC)   . S/P femoral-popliteal bypass surgery   . Malnutrition of moderate degree 11/01/2015  . Diabetic foot ulcer (HCC)   . Essential hypertension 10/31/2015  . Osteomyelitis of left foot (HCC) 10/30/2015  . PVD (peripheral vascular disease) with claudication (HCC) 09/28/2015  . Critical lower limb ischemia 08/28/2015  . Hyperlipidemia with target LDL less than 70 06/28/2013  . Diabetes mellitus type II, uncontrolled (HCC) 03/03/2013  . Complicated migraine 03/03/2013  . Stroke-like symptom 03/02/2013  . Diabetes mellitus (HCC) 03/02/2013  . Aphasia 03/02/2013  . Headache(784.0) 03/02/2013  . PAD (peripheral artery disease) (HCC) 06/05/2011  . Coronary artery disease involving  native coronary artery of native heart without angina pectoris 06/05/2011  . Hypotension 06/05/2011  . Claudication in peripheral vascular disease:  Lifestyle limiting. 06/05/2011  . Hx of tobacco use, presenting hazards to health 06/05/2011    Orientation RESPIRATION BLADDER Height & Weight     Self, Time, Situation, Place  O2 (Nasal cannula 2.5L) Continent, Indwelling catheter (Urinary catheter) Weight: 72 kg (158 lb 12.8 oz) Height:  5\' 4"  (162.6 cm)  BEHAVIORAL SYMPTOMS/MOOD NEUROLOGICAL BOWEL NUTRITION STATUS   (N/A)   Continent Diet (Please see DC Summary)  AMBULATORY STATUS COMMUNICATION OF NEEDS Skin   Extensive Assist Verbally Other (Comment)                       Personal Care Assistance Level of Assistance  Bathing, Feeding, Dressing Bathing Assistance: Maximum assistance Feeding assistance: Independent Dressing Assistance: Limited assistance     Functional Limitations Info             SPECIAL CARE FACTORS FREQUENCY  PT (By licensed PT), OT (By licensed OT)     PT Frequency: 5x/week OT Frequency: 3x/week            Contractures Contractures Info: Not present    Additional Factors Info  Code Status, Allergies, Insulin Sliding Scale Code Status Info: Full Allergies Info: Hydrochlorothiazide, Latex, Penicillins   Insulin Sliding Scale Info: insulin aspart (novoLOG) injection 0-15 Units;insulin glargine (LANTUS) injection 35 Units       Current Medications (11/09/2015):  This is the current hospital active medication list Current Facility-Administered Medications  Medication Dose Route Frequency Provider Last Rate Last Dose  .  0.9 %  sodium chloride infusion   Intravenous Continuous Coralie KeensMauricio Daniel Arrien, MD 10 mL/hr at 11/07/15 1827    . acetaminophen (TYLENOL) tablet 325-650 mg  325-650 mg Oral Q4H PRN Ames CoupeSamantha J Rhyne, PA-C   650 mg at 11/06/15 [id number]   Or  . acetaminophen (TYLENOL) suppository 325-650 mg  325-650 mg Rectal Q4H PRN Ames CoupeSamantha J  Rhyne, PA-C      . alum & mag hydroxide-simeth (MAALOX/MYLANTA) 200-200-20 MG/5ML suspension 15-30 mL  15-30 mL Oral Q2H PRN Samantha J Rhyne, PA-C      . aspirin EC tablet 81 mg  81 mg Oral Daily Sherren Kernsharles E Fields, MD   81 mg at 11/09/15 [id number]  . atorvastatin (LIPITOR) tablet 80 mg  80 mg Oral Daily Jonah BlueJennifer Yates, MD   80 mg at 11/09/15 0927  . bisacodyl (DULCOLAX) suppository 10 mg  10 mg Rectal Daily PRN Samantha J Rhyne, PA-C      . ceFEPIme (MAXIPIME) 2 g in dextrose 5 % 50 mL IVPB  2 g Intravenous Q12H Lenox PondsEdwin Silva Zapata, MD   2 g at 11/09/15 1119  . chlorhexidine (HIBICLENS) 4 % liquid 4 application  60 mL Topical Once Adonis HugueninErin R Zamora, NP      . clopidogrel (PLAVIX) tablet 75 mg  75 mg Oral Daily Sherren Kernsharles E Fields, MD   75 mg at 11/09/15 [id number]  . collagenase (SANTYL) ointment   Topical Daily Nadara MustardMarcus V Duda, MD      . diphenhydrAMINE (BENADRYL) capsule 50 mg  50 mg Oral Once Leanne ChangKatherine P Schorr, NP   Stopped at 11/09/15 0000  . feeding supplement (PRO-STAT SUGAR FREE 64) liquid 30 mL  30 mL Oral BID Drema Dallasurtis J Woods, MD   30 mL at 11/09/15 0926  . fentaNYL (SUBLIMAZE) injection 25-50 mcg  25-50 mcg Intravenous Q2H PRN Leanne ChangKatherine P Schorr, NP   50 mcg at 11/09/15 0948  . gabapentin (NEURONTIN) capsule 300 mg  300 mg Oral TID Jonah BlueJennifer Yates, MD   300 mg at 11/09/15 0927  . guaiFENesin-dextromethorphan (ROBITUSSIN DM) 100-10 MG/5ML syrup 15 mL  15 mL Oral Q4H PRN Samantha J Rhyne, PA-C      . insulin aspart (novoLOG) injection 0-15 Units  0-15 Units Subcutaneous TID WC Lonia BloodJeffrey T McClung, MD   2 Units at 11/09/15 [id number]  . insulin glargine (LANTUS) injection 35 Units  35 Units Subcutaneous Q2200 Jonah BlueJennifer Yates, MD   35 Units at 11/08/15 2309  . lisinopril (PRINIVIL,ZESTRIL) tablet 2.5 mg  2.5 mg Oral Daily Drema Dallasurtis J Woods, MD      . magnesium hydroxide (MILK OF MAGNESIA) suspension 30 mL  30 mL Oral Daily PRN Ames CoupeSamantha J Rhyne, PA-C      . metoprolol (LOPRESSOR) tablet 50 mg  50 mg Oral BID Marykay Lexavid W Harding, MD    50 mg at 11/09/15 [id number]  . ondansetron (ZOFRAN) injection 4 mg  4 mg Intravenous Q6H PRN Samantha J Rhyne, PA-C      . pantoprazole (PROTONIX) EC tablet 40 mg  40 mg Oral Q1200 Lonia BloodJeffrey T McClung, MD   40 mg at 11/08/15 1258  . phenol (CHLORASEPTIC) mouth spray 1 spray  1 spray Mouth/Throat PRN Samantha J Rhyne, PA-C      . polyethylene glycol (MIRALAX / GLYCOLAX) packet 17 g  17 g Oral Daily Lenox PondsEdwin Silva Zapata, MD   17 g at 11/09/15 0927  . senna-docusate (Senokot-S) tablet 1 tablet  1 tablet Oral QHS Lenox PondsEdwin Silva Zapata, MD   1 tablet  at 11/08/15 2151  . sodium chloride flush (NS) 0.9 % injection 10-40 mL  10-40 mL Intracatheter PRN Tyrone Nine, MD      . vancomycin (VANCOCIN) IVPB 1000 mg/200 mL premix  1,000 mg Intravenous Q12H Quenton Fetter, RPH   1,000 mg at 11/08/15 2228     Discharge Medications: Please see discharge summary for a list of discharge medications.  Relevant Imaging Results:  Relevant Lab Results:   Additional Information SSN: [id number] [place], [place]

## 2015-11-09 NOTE — Progress Notes (Signed)
PROGRESS NOTE    [name] [name]  RUE:[id number] DOB: 01-18-1951 DOA: 10/30/2015 PCP: Georgann Housekeeper, MD    Brief Narrative: 65 yo F w/ hx PVD / PAD and a L foot plantar abscess who was admitted on 10/2 and underwent L fem-pop bypass on 11/02/15. Evaluated by Dr Lajoyce Corners 10/06 who recommended surgical debridement. Later on 10/6 she was noted to have acutely developed slurred speech, L sided weakness, and altered MS. She had received narcotics 2 hours earlier so she was given narcan. She responded with improved MS. A head CT ruled out acute bleed. Through the following pm she had progressive lethargy, garbled speech, poor airway management and then shock. She was started on dopamine and PCCM was consulted. Neurology was consulted for suspected acute post-op CVA. ABG showed adequate ventilation and oxygenation.  She received 2 u PRBCs for hgb 5.8 due to bleeding into left thigh. Intubated evening of 10/6 for concern for ability to protect airway. Extubated on 10/07. Hb and hct stable. Developed MI type 2 due to anemia, conservative management was undertaken. Plan is for surgical debridement 10/13 per Dr. Lajoyce Corners.   Assessment & Plan:   Principal Problem:   Osteomyelitis of left foot (HCC) Active Problems:   Diabetes mellitus type II, uncontrolled (HCC)   Hyperlipidemia with target LDL less than 70   PVD (peripheral vascular disease) with claudication (HCC)   Essential hypertension   Malnutrition of moderate degree   Diabetic foot ulcer (HCC)   S/P femoral-popliteal bypass surgery   Septic shock (HCC)   Encephalopathy acute   Acute blood loss anemia   Acute respiratory failure with hypoxia (HCC)   CVA (cerebral vascular accident) (HCC)   Central line infection, initial encounter   Demand myocardial infarction   Surgery, elective   Traumatic hemorrhagic shock (HCC)   Chronic systolic CHF (congestive heart failure) (HCC)   Uncontrolled type 2 diabetes mellitus with complication (HCC)  Acute hypoxemic  respiratory failure: Related to diminished mentation which has improved. s/p intubation now stable on room air - Continue oxygen by  to maintain SpO2 >92% - Chest film 10/8 noted right upper lobe infiltrate. Pt on vanc/cefepime.  Osteomyelitis due to diabetic foot ulcer with peripheral vascular disease: - Continue vanc/cefepime - Cultures NGTD - Pain controlled with fentanyl, bowel regimen ordered - to OR for I&D 10/13 per Dr. Lajoyce Corners. NPO p MN. - WOC   Hemorrhagic shock (due to bleeding on left thigh): - Trend CBCs - Transfuse prn hgb < 8   Type 2 NSTEMI, chronic systolic CHF and HTN: Patient hemodynamic stable, will continue antiplatelet therapy with clopidogrel/ asa and statin therapy. No active chest pain.  - Echo w/preserved EF, G1DD.  - Continue metoprolol.  - Daily weights, I/O - Started lisinopril due to history of HFrEF, will D/C this due to hypotension and recent echo with preserved EF.  T2DM: Uncontrolled with complications including current diabetic foot infection. 14 year history with neuropathy. A1c has improved 13 > 7 recently. - Insulin glargine 35 units daily + mod SSI  Acute change in mental status with possible right brain stem subcortical infarct: - CT head was negative. Neurology consulted - Patient awake and alert, non focal. Will continue antiplatelet therapy.  DVT prophylaxis: SCDs Code Status: Full Family Communication: None at bedside. Declined offer to call Disposition Plan: home    Consultants:  Dr.Christopher Adele Dan Vascular surgery Dr Aldean Baker. [facility] Orthopedic surgery Dr Roselyn Reef Willodean Rosenthal. Neurology Cardiology Critical Care  Procedures:  Foley catheter 10/5 >  10/12 10/5 ; -Left femoral to below-knee popliteal artery bypass with non-reversed translocated left great saphenous vein.- Intraoperative arteriogram 10/6:ABI completed: Left posterior tibial and dorsalis pedis pressures were not obtained due to patient pain tolerance, recent  surgery, and surgical dressings. Right ABI of 0.28 is suggestive of critical arterial occlusive disease at rest. Left TBI of 0.20 is suggestive of abnormal arterial flow into the great toe at rest. 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 - No evidence of acute infarct.-Chronically decreased number of distal right MCA branch vessels.No evidence of acute major branch occlusion. -Severe right V4 vertebral artery stenosis. -60-65% stenoses of the brachiocephalic and subclavian arteries. -Interlobular septal thickening and ground-glass opacities in lung apices, possibly reflecting edema.  10/11 echocardiogram:- LVEF = 50% to 55%. Akinesis of the basal-midinferoseptal myocardium. -(grade 1diastolic dysfunction). -Left atrium: The atrium was mildly dilated.   Antimicrobials:   Vancomycin (10/3 >> )  cefepime (10/6 >> )  Aztreonam (10/3 >> 10/4)  Ceftriaxone (10/4 x1)  Subjective: Patient expresses concern over plan for surgery. Nervous. Reports controlled pain in LLE. Denies fever, N/V/D.  Objective: Vitals:   11/09/15 0500 11/09/15 0531 11/09/15 0931 11/09/15 1137  BP:  140/60 (!) 164/69 (!) 132/57  Pulse:  87 99 82  Resp:  18    Temp:  98.6 F (37 C)    TempSrc:  Oral    SpO2:  97% 95% 98%  Weight: 72 kg (158 lb 12.8 oz)     Height:        Intake/Output Summary (Last 24 hours) at 11/09/15 1140 Last data filed at 11/09/15 1000  Gross per 24 hour  Intake              120 ml  Output             2525 ml  Net            -2405 ml   Filed Weights   11/07/15 2239 11/08/15 1917 11/09/15 0500  Weight: 72.2 kg (159 lb 2.8 oz) 73.6 kg (162 lb 4.1 oz) 72 kg (158 lb 12.8 oz)    Examination:  General exam: Deconditioned E ENT: Mild conjunctival pallor, oral mucosa moist. Respiratory system: Clear to auscultation. Respiratory effort normal. Mild decreased breath sounds at bases, no wheezing, rales or rhonchi.    Cardiovascular system: S1 & S2 heard, RRR. No JVD, murmurs, rubs, gallops or clicks. No pedal edema. Gastrointestinal system: Abdomen is nondistended, soft and nontender. No organomegaly or masses felt. Normal bowel sounds heard. Central nervous system: Alert and oriented. No focal neurological deficits. Extremities: Symmetric 5 x 5 power. Left foot with dressing in place. Linear surgical incisions c/d/i on left leg without drainage.  Skin: wound with dressing in place on left foot.   Data Reviewed: I have personally reviewed following labs and imaging studies  CBC:  Recent Labs Lab 11/05/15 1008 11/05/15 2236 11/06/15 1345 11/07/15 0447 11/08/15 0530  WBC 12.6* 10.8* 9.6 8.7 11.8*  NEUTROABS  --   --   --   --  8.5*  HGB 8.3* 7.8* 8.6* 8.0* 8.3*  HCT 25.4* 24.3* 26.3* 24.7* 25.0*  MCV 87.9 88.0 88.6 88.8 85.9  PLT 272 276 325 321 448*   Basic Metabolic Panel:  Recent Labs Lab 11/02/15 2202  11/03/15 0416 11/03/15 1612 11/05/15 0458 11/06/15 0600 11/07/15 0447 11/08/15 0530  NA  --   --  139 137 140  --  140 140  K  --   --  4.2 4.3 3.3*  --  3.4* 3.1*  CL  --   --  108 109 105  --  106 105  CO2  --   --  25 20* 27  --  27 27  GLUCOSE  --   --  81 114* 96  --  64* 51*  BUN  --   --  12 14 9   --  <5* 5*  CREATININE  --   < > 0.62 0.64 0.47 0.43* 0.43* 0.41*  CALCIUM  --   --  8.1* 7.8* 8.1*  --  8.2* 8.5*  MG 1.2*  --   --   --   --   --   --   --   < > = values in this interval not displayed. GFR: Estimated Creatinine Clearance: 68.2 mL/min (by C-G formula based on SCr of 0.41 mg/dL (L)). Liver Function Tests:  Recent Labs Lab 11/03/15 1612 11/05/15 0458 11/07/15 0447  AST 31 27 22   ALT 13* 12* 15  ALKPHOS 85 74 85  BILITOT 0.7 0.4 0.6  PROT 4.8* 5.0* 5.6*  ALBUMIN 1.6* 1.4* 1.5*   No results for input(s): LIPASE, AMYLASE in the last 168 hours. No results for input(s): AMMONIA in the last 168 hours. Coagulation Profile:  Recent Labs Lab  11/03/15 2143 11/04/15 0347  INR 1.22 1.21   Cardiac Enzymes:  Recent Labs Lab 11/03/15 1612 11/03/15 2223 11/04/15 0347 11/04/15 1036  CKTOTAL 604*  --   --   --   TROPONINI 0.14* 1.77* 3.83* 2.32*   BNP (last 3 results) No results for input(s): PROBNP in the last 8760 hours. HbA1C: No results for input(s): HGBA1C in the last 72 hours. CBG:  Recent Labs Lab 11/08/15 0841 11/08/15 1122 11/08/15 1704 11/08/15 2208 11/09/15 0802  GLUCAP 107* 143* 137* 259* 140*   Lipid Profile: No results for input(s): CHOL, HDL, LDLCALC, TRIG, CHOLHDL, LDLDIRECT in the last 72 hours. Thyroid Function Tests: No results for input(s): TSH, T4TOTAL, FREET4, T3FREE, THYROIDAB in the last 72 hours. Anemia Panel: No results for input(s): VITAMINB12, FOLATE, FERRITIN, TIBC, IRON, RETICCTPCT in the last 72 hours. Sepsis Labs:  Recent Labs Lab 11/03/15 1612 11/03/15 2329  LATICACIDVEN 3.1* 1.6   Recent Results (from the past 240 hour(s))  Surgical pcr screen     Status: None   Collection Time: 10/31/15  2:02 AM  Result Value Ref Range Status   MRSA, PCR NEGATIVE NEGATIVE Final   Staphylococcus aureus NEGATIVE NEGATIVE Final    Comment:        The Xpert SA Assay (FDA approved for NASAL specimens in patients over 65 years of age), is one component of a comprehensive surveillance program.  Test performance has been validated by [facility] for patients greater than or equal to 252 year old. It is not intended to diagnose infection nor to guide or monitor treatment.   Culture, blood (routine x 2)     Status: None   Collection Time: 11/03/15  4:14 PM  Result Value Ref Range Status   Specimen Description BLOOD LEFT HAND  Final   Special Requests IN PEDIATRIC BOTTLE 2CC  Final   Culture NO GROWTH 5 DAYS  Final   Report Status 11/08/2015 FINAL  Final  Culture, blood (routine x 2)     Status: None   Collection Time: 11/03/15  4:17 PM  Result Value Ref Range Status   Specimen  Description BLOOD LEFT  WRIST  Final   Special Requests BOTTLES DRAWN AEROBIC ONLY 5CC  Final   Culture NO GROWTH 5 DAYS  Final   Report Status 11/08/2015 FINAL  Final  Urine culture     Status: None   Collection Time: 11/03/15  4:17 PM  Result Value Ref Range Status   Specimen Description URINE, RANDOM  Final   Special Requests NONE  Final   Culture NO GROWTH  Final   Report Status 11/05/2015 FINAL  Final  Urine culture     Status: None   Collection Time: 11/03/15  7:04 PM  Result Value Ref Range Status   Specimen Description URINE, CATHETERIZED  Final   Special Requests NONE  Final   Culture NO GROWTH  Final   Report Status 11/04/2015 FINAL  Final  C difficile quick scan w PCR reflex     Status: None   Collection Time: 11/05/15  4:21 PM  Result Value Ref Range Status   C Diff antigen NEGATIVE NEGATIVE Final   C Diff toxin NEGATIVE NEGATIVE Final   C Diff interpretation No C. difficile detected.  Final    Radiology Studies: No results found.  Scheduled Meds: . aspirin EC  81 mg Oral Daily  . atorvastatin  80 mg Oral Daily  . ceFEPime (MAXIPIME) IV  2 g Intravenous Q12H  . chlorhexidine  60 mL Topical Once  . clopidogrel  75 mg Oral Daily  . collagenase   Topical Daily  . diphenhydrAMINE  50 mg Oral Once  . feeding supplement (PRO-STAT SUGAR FREE 64)  30 mL Oral BID  . gabapentin  300 mg Oral TID  . insulin aspart  0-15 Units Subcutaneous TID WC  . insulin glargine  35 Units Subcutaneous Q2200  . lisinopril  2.5 mg Oral Daily  . metoprolol tartrate  50 mg Oral BID  . pantoprazole  40 mg Oral Q1200  . polyethylene glycol  17 g Oral Daily  . senna-docusate  1 tablet Oral QHS  . vancomycin  1,000 mg Intravenous Q12H   Continuous Infusions: . sodium chloride 10 mL/hr at 11/07/15 1827    LOS: 10 days   Hazeline Junker, MD [facility] Pager [phone]  If 7PM-7AM, please contact night-coverage [email or web address] Password [email or web address] 11/09/2015, 11:40 AM

## 2015-11-09 NOTE — Progress Notes (Signed)
Patient is refusing Patient ID: [name] [name], female   DOB: [birth year], 65 y.o.   MRN: [id number] Patient is refusing to proceed with debridement surgery of her left  foot, the wound is improving with hydrotherapy. I will cancel surgery for tomorrow.  Wound continue daily santyl dressing changes at discharge, I will follow up in the office after discharge.

## 2015-11-09 NOTE — Progress Notes (Signed)
Patient stated she recently got off the phone with Dr. Lajoyce Corners and was agreeable to surgery tomorrow. Orders replaced for NPO at midnight

## 2015-11-10 ENCOUNTER — Encounter (HOSPITAL_COMMUNITY)
Admission: EM | Disposition: A | Discharge status: Skilled Nursing Facility | Payer: Self-pay | Source: Home / Self Care | Attending: Family Medicine

## 2015-11-10 ENCOUNTER — Inpatient Hospital Stay (HOSPITAL_COMMUNITY): Payer: Medicare Other | Admitting: Anesthesiology

## 2015-11-10 ENCOUNTER — Encounter (HOSPITAL_COMMUNITY): Payer: Self-pay | Admitting: Anesthesiology

## 2015-11-10 DIAGNOSIS — M86172 Other acute osteomyelitis, left ankle and foot: Secondary | ICD-10-CM | POA: Diagnosis not present

## 2015-11-10 DIAGNOSIS — I219 Acute myocardial infarction, unspecified: Secondary | ICD-10-CM

## 2015-11-10 DIAGNOSIS — L97423 Non-pressure chronic ulcer of left heel and midfoot with necrosis of muscle: Secondary | ICD-10-CM | POA: Diagnosis not present

## 2015-11-10 DIAGNOSIS — L02612 Cutaneous abscess of left foot: Secondary | ICD-10-CM | POA: Diagnosis not present

## 2015-11-10 DIAGNOSIS — I5022 Chronic systolic (congestive) heart failure: Secondary | ICD-10-CM

## 2015-11-10 HISTORY — PX: I & D EXTREMITY: SHX5045

## 2015-11-10 LAB — GLUCOSE, CAPILLARY
GLUCOSE-CAPILLARY: 105 mg/dL — AB (ref 65–99)
GLUCOSE-CAPILLARY: 95 mg/dL (ref 65–99)
GLUCOSE-CAPILLARY: 170 mg/dL — AB (ref 65–99)
Glucose-Capillary: 121 mg/dL — ABNORMAL HIGH (ref 65–99)
Glucose-Capillary: 127 mg/dL — ABNORMAL HIGH (ref 65–99)
Glucose-Capillary: 104 mg/dL — ABNORMAL HIGH (ref 65–99)

## 2015-11-10 SURGERY — IRRIGATION AND DEBRIDEMENT EXTREMITY
Anesthesia: Monitor Anesthesia Care | Site: Foot | Laterality: Left

## 2015-11-10 SURGERY — IRRIGATION AND DEBRIDEMENT EXTREMITY
Anesthesia: Choice | Laterality: Left

## 2015-11-10 MED ORDER — 0.9 % SODIUM CHLORIDE (POUR BTL) OPTIME
TOPICAL | Status: DC | PRN
  Administered 2015-11-10: 1000 mL

## 2015-11-10 MED ORDER — FENTANYL CITRATE (PF) 100 MCG/2ML IJ SOLN
INTRAMUSCULAR | Status: AC
Start: 2015-11-10 — End: 2015-11-10
  Filled 2015-11-10: qty 2

## 2015-11-10 MED ORDER — METHOCARBAMOL 1000 MG/10ML IJ SOLN
500.0000 mg | Freq: Four times a day (QID) | INTRAVENOUS | Status: DC | PRN
  Filled 2015-11-10: qty 5

## 2015-11-10 MED ORDER — FENTANYL CITRATE (PF) 100 MCG/2ML IJ SOLN
INTRAMUSCULAR | Status: DC | PRN
Start: 2015-11-10 — End: 2015-11-10
  Administered 2015-11-10 (×4): 50 ug via INTRAVENOUS

## 2015-11-10 MED ORDER — MIDAZOLAM HCL 5 MG/5ML IJ SOLN
INTRAMUSCULAR | Status: DC | PRN
  Administered 2015-11-10 (×2): 1 mg via INTRAVENOUS

## 2015-11-10 MED ORDER — PHENYLEPHRINE HCL 10 MG/ML IJ SOLN
INTRAMUSCULAR | Status: DC | PRN
  Administered 2015-11-10: 80 ug via INTRAVENOUS
  Administered 2015-11-10: 40 ug via INTRAVENOUS

## 2015-11-10 MED ORDER — SODIUM CHLORIDE 0.9 % IV SOLN
INTRAVENOUS | Status: DC
  Administered 2015-11-10 – 2015-11-12 (×2): via INTRAVENOUS

## 2015-11-10 MED ORDER — ONDANSETRON HCL 4 MG/2ML IJ SOLN
INTRAMUSCULAR | Status: AC
  Filled 2015-11-10: qty 2

## 2015-11-10 MED ORDER — MIDAZOLAM HCL 2 MG/2ML IJ SOLN
INTRAMUSCULAR | Status: AC
  Filled 2015-11-10: qty 2

## 2015-11-10 MED ORDER — PROMETHAZINE HCL 25 MG/ML IJ SOLN: 6.2500 mg | INTRAMUSCULAR | Status: DC | PRN

## 2015-11-10 MED ORDER — FENTANYL CITRATE (PF) 100 MCG/2ML IJ SOLN
25.0000 ug | INTRAMUSCULAR | Status: DC | PRN
Start: 2015-11-10 — End: 2015-11-10
  Administered 2015-11-10 (×3): 50 ug via INTRAVENOUS

## 2015-11-10 MED ORDER — METOCLOPRAMIDE HCL 5 MG/ML IJ SOLN: 5.0000 mg | Freq: Three times a day (TID) | INTRAMUSCULAR | Status: DC | PRN

## 2015-11-10 MED ORDER — ONDANSETRON HCL 4 MG/2ML IJ SOLN
INTRAMUSCULAR | Status: DC | PRN
  Administered 2015-11-10: 4 mg via INTRAVENOUS

## 2015-11-10 MED ORDER — ONDANSETRON HCL 4 MG/2ML IJ SOLN: 4.0000 mg | Freq: Four times a day (QID) | INTRAMUSCULAR | Status: DC | PRN

## 2015-11-10 MED ORDER — METOCLOPRAMIDE HCL 10 MG PO TABS: 5.0000 mg | ORAL_TABLET | Freq: Three times a day (TID) | ORAL | Status: DC | PRN

## 2015-11-10 MED ORDER — SODIUM CHLORIDE 0.9 % IR SOLN
Status: DC | PRN
  Administered 2015-11-10: 1

## 2015-11-10 MED ORDER — ONDANSETRON HCL 4 MG PO TABS: 4.0000 mg | ORAL_TABLET | Freq: Four times a day (QID) | ORAL | Status: DC | PRN

## 2015-11-10 MED ORDER — ACETAMINOPHEN 325 MG PO TABS: 650.0000 mg | ORAL_TABLET | Freq: Four times a day (QID) | ORAL | Status: DC | PRN

## 2015-11-10 MED ORDER — FENTANYL CITRATE (PF) 100 MCG/2ML IJ SOLN
INTRAMUSCULAR | Status: AC
  Administered 2015-11-10: 18:00:00
  Filled 2015-11-10: qty 2

## 2015-11-10 MED ORDER — PHENYLEPHRINE 40 MCG/ML (10ML) SYRINGE FOR IV PUSH (FOR BLOOD PRESSURE SUPPORT)
PREFILLED_SYRINGE | INTRAVENOUS | Status: AC
  Filled 2015-11-10: qty 10

## 2015-11-10 MED ORDER — PROPOFOL 10 MG/ML IV BOLUS
INTRAVENOUS | Status: DC | PRN
  Administered 2015-11-10: 20 mg via INTRAVENOUS

## 2015-11-10 MED ORDER — CLINDAMYCIN PHOSPHATE 900 MG/50ML IV SOLN
INTRAVENOUS | Status: AC
  Filled 2015-11-10: qty 50

## 2015-11-10 MED ORDER — ACETAMINOPHEN 650 MG RE SUPP: 650.0000 mg | Freq: Four times a day (QID) | RECTAL | Status: DC | PRN

## 2015-11-10 MED ORDER — METHOCARBAMOL 500 MG PO TABS
500.0000 mg | ORAL_TABLET | Freq: Four times a day (QID) | ORAL | Status: DC | PRN
Start: 2015-11-10 — End: 2015-11-14

## 2015-11-10 MED ORDER — PROPOFOL 500 MG/50ML IV EMUL
INTRAVENOUS | Status: DC | PRN
  Administered 2015-11-10: 75 ug/kg/min via INTRAVENOUS

## 2015-11-10 MED ORDER — FENTANYL CITRATE (PF) 100 MCG/2ML IJ SOLN
INTRAMUSCULAR | Status: AC
  Filled 2015-11-10: qty 2

## 2015-11-10 MED ORDER — OXYCODONE HCL 5 MG PO TABS
5.0000 mg | ORAL_TABLET | ORAL | Status: DC | PRN
  Administered 2015-11-10 – 2015-11-14 (×7): 10 mg via ORAL
  Filled 2015-11-10 (×8): qty 2

## 2015-11-10 SURGICAL SUPPLY — 43 items
BLADE SURG 10 STRL SS (BLADE) IMPLANT
BLADE SURG 21 STRL SS (BLADE) ×3 IMPLANT
BNDG COHESIVE 4X5 TAN STRL (GAUZE/BANDAGES/DRESSINGS) IMPLANT
BNDG COHESIVE 6X5 TAN STRL LF (GAUZE/BANDAGES/DRESSINGS) IMPLANT
BNDG GAUZE ELAST 4 BULKY (GAUZE/BANDAGES/DRESSINGS) ×2 IMPLANT
CANISTER WOUND CARE 500ML ATS (WOUND CARE) ×4 IMPLANT
CONT SPEC 4OZ CLIKSEAL STRL BL (MISCELLANEOUS) ×2 IMPLANT
COVER SURGICAL LIGHT HANDLE (MISCELLANEOUS) ×4 IMPLANT
DRAPE INCISE IOBAN 66X45 STRL (DRAPES) ×4 IMPLANT
DRAPE U-SHAPE 47X51 STRL (DRAPES) ×1 IMPLANT
DRESSING VERAFLO CLEANSE CC (GAUZE/BANDAGES/DRESSINGS) IMPLANT
DRSG ADAPTIC 3X8 NADH LF (GAUZE/BANDAGES/DRESSINGS) ×1 IMPLANT
DRSG VERAFLO CLEANSE CC (GAUZE/BANDAGES/DRESSINGS) ×3
DURAPREP 26ML APPLICATOR (WOUND CARE) ×3 IMPLANT
ELECT CAUTERY BLADE 6.4 (BLADE) IMPLANT
ELECT REM PT RETURN 9FT ADLT (ELECTROSURGICAL)
ELECTRODE REM PT RTRN 9FT ADLT (ELECTROSURGICAL) IMPLANT
GAUZE SPONGE 4X4 12PLY STRL (GAUZE/BANDAGES/DRESSINGS) ×1 IMPLANT
GLOVE BIOGEL PI IND STRL 9 (GLOVE) ×1 IMPLANT
GLOVE BIOGEL PI INDICATOR 9 (GLOVE) ×2
GLOVE SURG ORTHO 9.0 STRL STRW (GLOVE) ×3 IMPLANT
GOWN STRL REUS W/ TWL LRG LVL3 (GOWN DISPOSABLE) ×1 IMPLANT
GOWN STRL REUS W/ TWL XL LVL3 (GOWN DISPOSABLE) ×2 IMPLANT
GOWN STRL REUS W/TWL LRG LVL3 (GOWN DISPOSABLE) ×3
GOWN STRL REUS W/TWL XL LVL3 (GOWN DISPOSABLE) ×6
HANDPIECE INTERPULSE COAX TIP (DISPOSABLE)
KIT BASIN OR (CUSTOM PROCEDURE TRAY) ×3 IMPLANT
KIT ROOM TURNOVER OR (KITS) ×3 IMPLANT
NS IRRIG 1000ML POUR BTL (IV SOLUTION) ×3 IMPLANT
PACK ORTHO EXTREMITY (CUSTOM PROCEDURE TRAY) ×3 IMPLANT
PAD ARMBOARD 7.5X6 YLW CONV (MISCELLANEOUS) ×4 IMPLANT
SET HNDPC FAN SPRY TIP SCT (DISPOSABLE) IMPLANT
SPONGE LAP 18X18 X RAY DECT (DISPOSABLE) ×2 IMPLANT
STOCKINETTE IMPERVIOUS 9X36 MD (GAUZE/BANDAGES/DRESSINGS) IMPLANT
SWAB COLLECTION DEVICE MRSA (MISCELLANEOUS) ×3 IMPLANT
TOWEL OR 17X24 6PK STRL BLUE (TOWEL DISPOSABLE) ×3 IMPLANT
TOWEL OR 17X26 10 PK STRL BLUE (TOWEL DISPOSABLE) ×3 IMPLANT
TUBE ANAEROBIC SPECIMEN COL (MISCELLANEOUS) ×2 IMPLANT
TUBE CONNECTING 12'X1/4 (SUCTIONS) ×1
TUBE CONNECTING 12X1/4 (SUCTIONS) ×2 IMPLANT
UNDERPAD 30X30 (UNDERPADS AND DIAPERS) ×1 IMPLANT
WATER STERILE IRR 1000ML POUR (IV SOLUTION) ×1 IMPLANT
YANKAUER SUCT BULB TIP NO VENT (SUCTIONS) ×3 IMPLANT

## 2015-11-10 NOTE — Care Management Important Message (Signed)
Important Message  Patient Details  Name: [name] [name] MRN: [id number] Date of Birth: 02/20/1950   Medicare Important Message Given:  Yes    Kyal Arts Abena 11/10/2015, 10:27 AM

## 2015-11-10 NOTE — Anesthesia Preprocedure Evaluation (Addendum)
Anesthesia Evaluation  Patient identified by MRN, date of birth, ID band Patient awake    Reviewed: Allergy & Precautions, NPO status , Patient's Chart, lab work & pertinent test results  Airway Mallampati: II  TM Distance: >3 FB Neck ROM: Full    Dental  (+) Edentulous Upper, Dental Advisory Given   Pulmonary former smoker,    breath sounds clear to auscultation       Cardiovascular hypertension, Pt. on medications + Past MI and +CHF   Rhythm:Regular Rate:Normal     Neuro/Psych TIACVA, No Residual Symptoms    GI/Hepatic GERD  Controlled,  Endo/Other  diabetes, Type 2, Insulin Dependent, Oral Hypoglycemic Agents  Renal/GU      Musculoskeletal   Abdominal   Peds  Hematology   Anesthesia Other Findings   Reproductive/Obstetrics                            Anesthesia Physical Anesthesia Plan  ASA: III  Anesthesia Plan: MAC   Post-op Pain Management:    Induction:   Airway Management Planned: Mask and Natural Airway  Additional Equipment:   Intra-op Plan:   Post-operative Plan:   Informed Consent: I have reviewed the patients History and Physical, chart, labs and discussed the procedure including the risks, benefits and alternatives for the proposed anesthesia with the patient or authorized representative who has indicated his/her understanding and acceptance.   Dental advisory given  Plan Discussed with: CRNA, Anesthesiologist and Surgeon  Anesthesia Plan Comments:         Anesthesia Quick Evaluation

## 2015-11-10 NOTE — Anesthesia Postprocedure Evaluation (Signed)
Anesthesia Post Note  Patient: [name] [name]  Procedure(s) Performed: Procedure(s) (LRB): Debridement Left Foot Ulcer, Application  Wound VAC (Left)  Patient location during evaluation: PACU Anesthesia Type: MAC Level of consciousness: awake and alert Pain management: pain level controlled Vital Signs Assessment: post-procedure vital signs reviewed and stable Respiratory status: spontaneous breathing, nonlabored ventilation and respiratory function stable Cardiovascular status: stable and blood pressure returned to baseline Anesthetic complications: no    Last Vitals:  Vitals:   11/10/15 1645 11/10/15 1649  BP:  (!) 149/73  Pulse: 90 92  Resp: 18 (!) 22  Temp:      Last Pain:  Vitals:   11/10/15 1640  TempSrc:   PainSc: Asleep                 Linton Rump

## 2015-11-10 NOTE — Progress Notes (Signed)
HydroTherapy Discharge Patient Details Name: [name] [name] MRN: [id number] DOB: 1950/10/16 Today's Date: 11/10/2015 Time:  -     Patient discharged from PT services secondary to pt with surgical I&D and placement of wound VAC .  GP   [employer] PT [phone]   [facility] 11/10/2015, 5:23 PM

## 2015-11-10 NOTE — Op Note (Signed)
10/30/2015 - 11/10/2015  3:34 PM  PATIENT:  [name]    PRE-OPERATIVE DIAGNOSIS:  Left Foot Ulcer  POST-OPERATIVE DIAGNOSIS:  Same  PROCEDURE:  Debridement Left Foot Ulcer, Application  Wound VAC  SURGEON:  [name], MD  PHYSICIAN ASSISTANT:None ANESTHESIA:   General  PREOPERATIVE INDICATIONS:  [name] [name] is a  65 y.o. female with a diagnosis of Left Foot Ulcer who failed conservative measures and elected for surgical management.    The risks benefits and alternatives were discussed with the patient preoperatively including but not limited to the risks of infection, bleeding, nerve injury, cardiopulmonary complications, the need for revision surgery, among others, and the patient was willing to proceed.  OPERATIVE IMPLANTS: Instillation cleanse wound VAC  OPERATIVE FINDINGS: Large necrotic ulcer 10 x 6 cm plantar left foot which extended to an ulcer over the fifth metatarsal head.  OPERATIVE PROCEDURE: Patient brought the operating room after undergoing a regional block she underwent a Mack anesthetic. After adequate levels anesthesia were obtained patient's left lower extremity was prepped using DuraPrep draped into a sterile field a timeout was called. A football shaped elliptical incision was made around the necrotic tissue and this extended down to the fascia and the skin soft tissue muscle and part of the plantar fascia was excised in 1 block of tissue. Electrocautery was used for hemostasis. Ronjair was also used to remove soft tissue and fascia. This also extended through tunneling to an ulcer on the fifth metatarsal head and this was also debrided. Before meals and wish technique was used to affix a cleanse instillation wound VAC sponge. This had a good suction fit patient was taken to the PACU in stable condition  We'll plan for discharge to home with a traditional wound VAC that will be changed Monday Wednesday Friday. Wound dimensions 10 x 6 cm and 2 cm deep.

## 2015-11-10 NOTE — Progress Notes (Signed)
PT Cancellation Note  Patient Details Name: JUANETTE RINNER MRN: [id number] DOB: 1950-05-28   Cancelled Treatment:    Reason Eval/Treat Not Completed: Per chart review, pt to go to OR today for L foot wound debridement and wound VAC placement. Please update hydro orders as needed after surgery.   Conni Slipper 11/10/2015, 9:37 AM   Conni Slipper, PT, DPT Acute Rehabilitation Services Pager: [phone]

## 2015-11-10 NOTE — Transfer of Care (Signed)
Immediate Anesthesia Transfer of Care Note  Patient: [name] [name] Age  Procedure(s) Performed: Procedure(s): Debridement Left Foot Ulcer, Application  Wound VAC (Left)  Patient Location: PACU  Anesthesia Type:MAC  Level of Consciousness: awake, oriented and patient cooperative  Airway & Oxygen Therapy: Patient Spontanous Breathing and Patient connected to face mask oxygen  Post-op Assessment: Report given to RN and Post -op Vital signs reviewed and stable  Post vital signs: Reviewed  Last Vitals:  Vitals:   11/09/15 2238 11/10/15 0449  BP: (!) 139/47 (!) 151/67  Pulse: 98 95  Resp: 18 18  Temp: 36.7 C 36.7 C    Last Pain:  Vitals:   11/10/15 1026  TempSrc:   PainSc: 4       Patients Stated Pain Goal: 2 (11/09/15 1958)  Complications: No apparent anesthesia complications

## 2015-11-10 NOTE — Progress Notes (Signed)
Pt iv clindamycin 900mg  not given according to the care order note at the [facility] pt suppose to receive it 60 minutes prior to surgery and  Her surgery is scheduled at 2:30pm, kelly the first shift nurse has been informed during a report time

## 2015-11-10 NOTE — H&P (View-Only) (Signed)
Patient is refusing Patient ID: [name] [name], female   DOB: 05/31/1950, 65 y.o.   MRN: [id number] Patient is refusing to proceed with debridement surgery of her left  foot, the wound is improving with hydrotherapy. I will cancel surgery for tomorrow.  Wound continue daily santyl dressing changes at discharge, I will follow up in the office after discharge. 

## 2015-11-10 NOTE — Progress Notes (Signed)
VASCULAR SURGERY:  For debridement of foot today by Dr. Lajoyce Corners. If problems from Vascular standpoint over the weekend, Dr. Randie Heinz is on call.  Waverly Ferrari, MD, FACS Beeper [phone] Office: [phone]

## 2015-11-10 NOTE — Progress Notes (Signed)
PROGRESS NOTE    [name] [name]  ZOX:[id number] DOB: 23-Feb-1950 DOA: 10/30/2015 PCP: Georgann Housekeeper, MD    Brief Narrative: 65 yo F w/ hx PVD / PAD and a L foot plantar abscess who was admitted on 10/2 and underwent L fem-pop bypass on 11/02/15. Evaluated by Dr Lajoyce Corners 10/06 who recommended surgical debridement. Later on 10/6 she was noted to have acutely developed slurred speech, L sided weakness, and altered MS. She had received narcotics 2 hours earlier so she was given narcan. She responded with improved MS. A head CT ruled out acute bleed. Through the following pm she had progressive lethargy, garbled speech, poor airway management and then shock. She was started on dopamine and PCCM was consulted. Neurology was consulted for suspected acute post-op CVA. ABG showed adequate ventilation and oxygenation.  She received 2 u PRBCs for hgb 5.8 due to bleeding into left thigh. Intubated evening of 10/6 for concern for ability to protect airway. Extubated on 10/07. Hb and hct stable. Developed MI type 2 due to anemia, conservative management was undertaken. Plan is for surgical debridement 10/13 per Dr. Lajoyce Corners.   Assessment & Plan:   Principal Problem:   Osteomyelitis of left foot (HCC) Active Problems:   Diabetes mellitus type II, uncontrolled (HCC)   Hyperlipidemia with target LDL less than 70   PVD (peripheral vascular disease) with claudication (HCC)   Essential hypertension   Malnutrition of moderate degree   Diabetic foot ulcer (HCC)   S/P femoral-popliteal bypass surgery   Septic shock (HCC)   Encephalopathy acute   Acute blood loss anemia   Acute respiratory failure with hypoxia (HCC)   CVA (cerebral vascular accident) (HCC)   Central line infection, initial encounter   Demand myocardial infarction   Surgery, elective   Traumatic hemorrhagic shock (HCC)   Chronic systolic CHF (congestive heart failure) (HCC)   Uncontrolled type 2 diabetes mellitus with complication (HCC)  Acute hypoxemic  respiratory failure: Related to diminished mentation which has improved. s/p intubation now stable on room air - Continue oxygen by [place] to maintain SpO2 >92% - Chest film 10/8 noted right upper lobe infiltrate. Pt on vanc/cefepime.  Osteomyelitis due to diabetic foot ulcer with peripheral vascular disease: - Continue vanc/cefepime - Cultures NGTD - Pain controlled with fentanyl, bowel regimen ordered - to OR for I&D 10/13 per Dr. Lajoyce Corners. - WOC   Hemorrhagic shock (due to bleeding on left thigh): - Trend CBCs - Transfuse prn hgb < 8   Type 2 NSTEMI, chronic systolic CHF and HTN: Patient hemodynamic stable, will continue antiplatelet therapy with clopidogrel/ asa and statin therapy. No active chest pain.  - Echo w/preserved EF, G1DD.  - Continue metoprolol.  - Daily weights, I/O - Continue ASA perioperatively - Started lisinopril due to history of HFrEF, will D/C this due to hypotension and recent echo with preserved EF.  T2DM: Uncontrolled with complications including current diabetic foot infection. 14 year history with neuropathy. A1c has improved 13 > 7 recently. - Insulin glargine 35 units daily + mod SSI  Acute change in mental status with possible right brain stem subcortical infarct: - CT head was negative. Neurology consulted - Patient awake and alert, non focal. Will continue antiplatelet therapy.  DVT prophylaxis: SCDs Code Status: Full Family Communication: Daughter at bedside Disposition Plan: To OR today.     Consultants:  Dr.Christopher Adele Dan Vascular surgery Dr Aldean Baker. [facility] Orthopedic surgery Dr Roselyn Reef Willodean Rosenthal. Neurology Cardiology Critical Care  Procedures:  Foley catheter 10/5 >  10/12 10/5 ; -Left femoral to below-knee popliteal artery bypass with non-reversed translocated left great saphenous vein.- Intraoperative arteriogram 10/6:ABI completed: Left posterior tibial and dorsalis pedis pressures were not obtained due to patient pain  tolerance, recent surgery, and surgical dressings. Right ABI of 0.28 is suggestive of critical arterial occlusive disease at rest. Left TBI of 0.20 is suggestive of abnormal arterial flow into the great toe at rest. 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 - No evidence of acute infarct.-Chronically decreased number of distal right MCA branch vessels.No evidence of acute major branch occlusion. -Severe right V4 vertebral artery stenosis. -60-65% stenoses of the brachiocephalic and subclavian arteries. -Interlobular septal thickening and ground-glass opacities in lung apices, possibly reflecting edema.  10/11 echocardiogram:- LVEF = 50% to 55%. Akinesis of the basal-midinferoseptal myocardium. -(grade 1diastolic dysfunction). -Left atrium: The atrium was mildly dilated.   Antimicrobials:   Vancomycin (10/3 >> )  cefepime (10/6 >> )  Aztreonam (10/3 >> 10/4)  Ceftriaxone (10/4 x1)  Subjective: Patient without complaint this AM. Pain in LLE controlled. Denies fever, N/V/D.  Objective: Vitals:   11/09/15 1137 11/09/15 1434 11/09/15 2238 11/10/15 0449  BP: (!) 132/57 (!) 138/53 (!) 139/47 (!) 151/67  Pulse: 82 89 98 95  Resp:  (!) 23 18 18   Temp:  97.9 F (36.6 C) 98.1 F (36.7 C) 98 F (36.7 C)  TempSrc:  Oral Oral Oral  SpO2: 98% 96% 90% 93%  Weight:    74.3 kg (163 lb 12.8 oz)  Height:        Intake/Output Summary (Last 24 hours) at 11/10/15 1237 Last data filed at 11/10/15 0837  Gross per 24 hour  Intake              260 ml  Output              975 ml  Net             -715 ml   Filed Weights   11/08/15 1917 11/09/15 0500 11/10/15 0449  Weight: 73.6 kg (162 lb 4.1 oz) 72 kg (158 lb 12.8 oz) 74.3 kg (163 lb 12.8 oz)    Examination:  General exam: Deconditioned E ENT: Mild conjunctival pallor, oral mucosa moist. Respiratory system: Clear to auscultation. Respiratory effort normal. Mild  decreased breath sounds at bases, no wheezing, rales or rhonchi.  Cardiovascular system: S1 & S2 heard, RRR. No JVD, murmurs, rubs, gallops or clicks. No pedal edema. Gastrointestinal system: Abdomen is nondistended, soft and nontender. No organomegaly or masses felt. Normal bowel sounds heard. Central nervous system: Alert and oriented. No focal neurological deficits. Extremities: Symmetric 5 x 5 power. Left foot with dressing in place. Linear surgical incisions c/d/i on left leg without drainage.  Skin: wound with dressing in place on left foot.   Data Reviewed: I have personally reviewed following labs and imaging studies  CBC:  Recent Labs Lab 11/05/15 2236 11/06/15 1345 11/07/15 0447 11/08/15 0530 11/09/15 1300  WBC 10.8* 9.6 8.7 11.8* 12.2*  NEUTROABS  --   --   --  8.5*  --   HGB 7.8* 8.6* 8.0* 8.3* 8.7*  HCT 24.3* 26.3* 24.7* 25.0* 26.4*  MCV 88.0 88.6 88.8 85.9 87.1  PLT 276 325 321 448* 414*   Basic Metabolic Panel:  Recent Labs Lab 11/03/15 1612 11/05/15 0458 11/06/15 0600 11/07/15 0447 11/08/15 0530 11/09/15 1300  NA 137 140  --  140 140 138  K 4.3  3.3*  --  3.4* 3.1* 3.9  CL 109 105  --  106 105 105  CO2 20* 27  --  27 27 26   GLUCOSE 114* 96  --  64* 51* 167*  BUN 14 9  --  <5* 5* 9  CREATININE 0.64 0.47 0.43* 0.43* 0.41* 0.51  CALCIUM 7.8* 8.1*  --  8.2* 8.5* 8.5*   GFR: Estimated Creatinine Clearance: 69.2 mL/min (by C-G formula based on SCr of 0.51 mg/dL). Liver Function Tests:  Recent Labs Lab 11/03/15 1612 11/05/15 0458 11/07/15 0447  AST 31 27 22   ALT 13* 12* 15  ALKPHOS 85 74 85  BILITOT 0.7 0.4 0.6  PROT 4.8* 5.0* 5.6*  ALBUMIN 1.6* 1.4* 1.5*   No results for input(s): LIPASE, AMYLASE in the last 168 hours. No results for input(s): AMMONIA in the last 168 hours. Coagulation Profile:  Recent Labs Lab 11/03/15 2143 11/04/15 0347  INR 1.22 1.21   Cardiac Enzymes:  Recent Labs Lab 11/03/15 1612 11/03/15 2223 11/04/15 0347  11/04/15 1036  CKTOTAL 604*  --   --   --   TROPONINI 0.14* 1.77* 3.83* 2.32*   BNP (last 3 results) No results for input(s): PROBNP in the last 8760 hours. HbA1C: No results for input(s): HGBA1C in the last 72 hours. CBG:  Recent Labs Lab 11/09/15 1220 11/09/15 1702 11/09/15 2236 11/10/15 0755 11/10/15 1225  GLUCAP 162* 165* 208* 121* 127*   Lipid Profile: No results for input(s): CHOL, HDL, LDLCALC, TRIG, CHOLHDL, LDLDIRECT in the last 72 hours. Thyroid Function Tests: No results for input(s): TSH, T4TOTAL, FREET4, T3FREE, THYROIDAB in the last 72 hours. Anemia Panel: No results for input(s): VITAMINB12, FOLATE, FERRITIN, TIBC, IRON, RETICCTPCT in the last 72 hours. Sepsis Labs:  Recent Labs Lab 11/03/15 1612 11/03/15 2329  LATICACIDVEN 3.1* 1.6   Recent Results (from the past 240 hour(s))  Culture, blood (routine x 2)     Status: None   Collection Time: 11/03/15  4:14 PM  Result Value Ref Range Status   Specimen Description BLOOD LEFT HAND  Final   Special Requests IN PEDIATRIC BOTTLE 2CC  Final   Culture NO GROWTH 5 DAYS  Final   Report Status 11/08/2015 FINAL  Final  Culture, blood (routine x 2)     Status: None   Collection Time: 11/03/15  4:17 PM  Result Value Ref Range Status   Specimen Description BLOOD LEFT WRIST  Final   Special Requests BOTTLES DRAWN AEROBIC ONLY 5CC  Final   Culture NO GROWTH 5 DAYS  Final   Report Status 11/08/2015 FINAL  Final  Urine culture     Status: None   Collection Time: 11/03/15  4:17 PM  Result Value Ref Range Status   Specimen Description URINE, RANDOM  Final   Special Requests NONE  Final   Culture NO GROWTH  Final   Report Status 11/05/2015 FINAL  Final  Urine culture     Status: None   Collection Time: 11/03/15  7:04 PM  Result Value Ref Range Status   Specimen Description URINE, CATHETERIZED  Final   Special Requests NONE  Final   Culture NO GROWTH  Final   Report Status 11/04/2015 FINAL  Final  C difficile  quick scan w PCR reflex     Status: None   Collection Time: 11/05/15  4:21 PM  Result Value Ref Range Status   C Diff antigen NEGATIVE NEGATIVE Final   C Diff toxin NEGATIVE NEGATIVE Final  C Diff interpretation No C. difficile detected.  Final    Radiology Studies: No results found.  Scheduled Meds: . aspirin EC  81 mg Oral Daily  . atorvastatin  80 mg Oral Daily  . ceFEPime (MAXIPIME) IV  2 g Intravenous Q12H  . clindamycin (CLEOCIN) IV  900 mg Intravenous To SSTC  . clopidogrel  75 mg Oral Daily  . collagenase   Topical Daily  . diphenhydrAMINE  50 mg Oral Once  . feeding supplement (PRO-STAT SUGAR FREE 64)  30 mL Oral BID  . gabapentin  300 mg Oral TID  . insulin aspart  0-15 Units Subcutaneous TID WC  . insulin glargine  35 Units Subcutaneous Q2200  . metoprolol tartrate  50 mg Oral BID  . pantoprazole  40 mg Oral Q1200  . polyethylene glycol  17 g Oral Daily  . senna-docusate  1 tablet Oral QHS  . vancomycin  1,000 mg Intravenous Q12H   Continuous Infusions: . sodium chloride 75 mL/hr at 11/10/15 1044  . sodium chloride 10 mL/hr at 11/07/15 1827    LOS: 11 days   Hazeline Junker, MD [facility] Pager [phone]  If 7PM-7AM, please contact night-coverage [email or web address] Password [facility] 11/10/2015, 12:37 PM

## 2015-11-10 NOTE — Interval H&P Note (Signed)
History and Physical Interval Note:  11/10/2015 7:15 AM  [name] [name] Age  has presented today for surgery, with the diagnosis of Left Foot Ulcer  The various methods of treatment have been discussed with the patient and family. After consideration of risks, benefits and other options for treatment, the patient has consented to  Procedure(s): Debridement Left Foot Ulcer, Apply Wound VAC (Left) as a surgical intervention .  The patient's history has been reviewed, patient examined, no change in status, stable for surgery.  I have reviewed the patient's chart and labs.  Questions were answered to the patient's satisfaction.     [name]

## 2015-11-10 NOTE — Anesthesia Procedure Notes (Signed)
Procedure Name: MAC Date/Time: 11/10/2015 2:50 PM Performed by: Lovie Chol Pre-anesthesia Checklist: Patient identified, Emergency Drugs available, Suction available, Patient being monitored and Timeout performed Patient Re-evaluated:Patient Re-evaluated prior to inductionOxygen Delivery Method: Simple face mask

## 2015-11-10 NOTE — Progress Notes (Signed)
Subjective:   No new complaints   Antibiotics:  Anti-infectives    Start     Dose/Rate Route Frequency Ordered Stop   11/10/15 1420  clindamycin (CLEOCIN) 900 MG/50ML IVPB    Comments:  Rock, Victorino Dike   : cabinet override      11/10/15 1420 11/10/15 1452   11/10/15 0600  clindamycin (CLEOCIN) IVPB 900 mg     900 mg 100 mL/hr over 30 Minutes Intravenous To Short Stay 11/09/15 1723 11/10/15 1452   11/09/15 0746  clindamycin (CLEOCIN) IVPB 900 mg  Status:  Discontinued     900 mg 100 mL/hr over 30 Minutes Intravenous On call to O.R. 11/09/15 0747 11/09/15 0754   11/06/15 1900  vancomycin (VANCOCIN) IVPB 1000 mg/200 mL premix     1,000 mg 200 mL/hr over 60 Minutes Intravenous Every 12 hours 11/06/15 1044     11/03/15 2000  ceFEPIme (MAXIPIME) 2 g in dextrose 5 % 50 mL IVPB     2 g 100 mL/hr over 30 Minutes Intravenous Every 12 hours 11/03/15 1913     11/03/15 1900  vancomycin (VANCOCIN) IVPB 750 mg/150 ml premix  Status:  Discontinued     750 mg 150 mL/hr over 60 Minutes Intravenous Every 12 hours 11/03/15 1743 11/06/15 1044   11/03/15 1900  aztreonam (AZACTAM) 1 g in dextrose 5 % 50 mL IVPB  Status:  Discontinued     1 g 100 mL/hr over 30 Minutes Intravenous Every 8 hours 11/03/15 1743 11/03/15 1909   11/02/15 0600  vancomycin (VANCOCIN) IVPB 1000 mg/200 mL premix  Status:  Discontinued     1,000 mg 200 mL/hr over 60 Minutes Intravenous On call to O.R. 11/01/15 0854 11/01/15 0910   11/01/15 1300  cefTRIAXone (ROCEPHIN) 1 g in dextrose 5 % 50 mL IVPB     1 g 100 mL/hr over 30 Minutes Intravenous  Once 11/01/15 1215 11/01/15 1407   10/31/15 0600  vancomycin (VANCOCIN) IVPB 750 mg/150 ml premix  Status:  Discontinued     750 mg 150 mL/hr over 60 Minutes Intravenous Every 12 hours 10/31/15 0156 11/02/15 1828   10/31/15 0600  aztreonam (AZACTAM) 1 g in dextrose 5 % 50 mL IVPB  Status:  Discontinued     1 g 100 mL/hr over 30 Minutes Intravenous Every 8 hours 10/31/15  0210 11/01/15 1215   10/31/15 0045  aztreonam (AZACTAM) 2 g in dextrose 5 % 50 mL IVPB     2 g 100 mL/hr over 30 Minutes Intravenous  Once 10/31/15 0033 10/31/15 0131   10/30/15 2030  vancomycin (VANCOCIN) 1,500 mg in sodium chloride 0.9 % 500 mL IVPB     1,500 mg 250 mL/hr over 120 Minutes Intravenous  Once 10/30/15 2020 10/30/15 2335   10/30/15 2030  levofloxacin (LEVAQUIN) IVPB 750 mg     750 mg 100 mL/hr over 90 Minutes Intravenous  Once 10/30/15 2020 10/30/15 2231      Medications: Scheduled Meds: . aspirin EC  81 mg Oral Daily  . atorvastatin  80 mg Oral Daily  . ceFEPime (MAXIPIME) IV  2 g Intravenous Q12H  . clopidogrel  75 mg Oral Daily  . diphenhydrAMINE  50 mg Oral Once  . feeding supplement (PRO-STAT SUGAR FREE 64)  30 mL Oral BID  . fentaNYL      . fentaNYL      . gabapentin  300 mg Oral TID  . insulin aspart  0-15 Units Subcutaneous TID  WC  . insulin glargine  35 Units Subcutaneous Q2200  . metoprolol tartrate  50 mg Oral BID  . pantoprazole  40 mg Oral Q1200  . polyethylene glycol  17 g Oral Daily  . senna-docusate  1 tablet Oral QHS  . vancomycin  1,000 mg Intravenous Q12H   Continuous Infusions: . sodium chloride 75 mL/hr at 11/10/15 1044  . sodium chloride 10 mL/hr at 11/07/15 1827  . sodium chloride     PRN Meds:.acetaminophen **OR** acetaminophen, acetaminophen **OR** acetaminophen, alum & mag hydroxide-simeth, bisacodyl, fentaNYL (SUBLIMAZE) injection, guaiFENesin-dextromethorphan, magnesium hydroxide, methocarbamol **OR** methocarbamol (ROBAXIN)  IV, metoCLOPramide **OR** metoCLOPramide (REGLAN) injection, ondansetron, ondansetron **OR** ondansetron (ZOFRAN) IV, phenol, sodium chloride flush    Objective: Weight change: 1 lb 8.7 oz (0.7 kg)  Intake/Output Summary (Last 24 hours) at 11/10/15 1720 Last data filed at 11/10/15 1600  Gross per 24 hour  Intake              445 ml  Output              470 ml  Net              -25 ml   Blood pressure  (!) 150/70, pulse 88, temperature 98 F (36.7 C), temperature source Oral, resp. rate 16, height 5\' 4"  (1.626 m), weight 163 lb 12.8 oz (74.3 kg), SpO2 98 %. Temp:  [97 F (36.1 C)-98.1 F (36.7 C)] 98 F (36.7 C) (10/13 1716) Pulse Rate:  [85-98] 88 (10/13 1716) Resp:  [14-22] 16 (10/13 1716) BP: (127-151)/(47-105) 150/70 (10/13 1716) SpO2:  [87 %-100 %] 98 % (10/13 1716) Weight:  [163 lb 12.8 oz (74.3 kg)] 163 lb 12.8 oz (74.3 kg) (10/13 0449)  Physical Exam: General: Alert and awake, oriented x 3  HEENT:EOMI CVS tachy rate, normal r,  no murmur rubs or gallops Chest:  no wheezing, rales or rhonchi Abdomen: soft , nondistended, normal bowel sounds, Extremities: pulse faint in LLE, dressing not taken down Neuro: He shows me her strength in her arms and states that she has had chronic low left upper extremity weakness  CBC:  CBC Latest Ref Rng & Units 11/09/2015 11/08/2015 11/07/2015  WBC 4.0 - 10.5 K/uL 12.2(H) 11.8(H) 8.7  Hemoglobin 12.0 - 15.0 g/dL 6.6(A) 8.3(L) 8.0(L)  Hematocrit 36.0 - 46.0 % 26.4(L) 25.0(L) 24.7(L)  Platelets 150 - 400 K/uL 414(H) 448(H) 321      BMET  Recent Labs  11/08/15 0530 11/09/15 1300  NA 140 138  K 3.1* 3.9  CL 105 105  CO2 27 26  GLUCOSE 51* 167*  BUN 5* 9  CREATININE 0.41* 0.51  CALCIUM 8.5* 8.5*     Liver Panel  No results for input(s): PROT, ALBUMIN, AST, ALT, ALKPHOS, BILITOT, BILIDIR, IBILI in the last 72 hours.     Sedimentation Rate No results for input(s): ESRSEDRATE in the last 72 hours. C-Reactive Protein No results for input(s): CRP in the last 72 hours.  Micro Results: Recent Results (from the past 720 hour(s))  Surgical pcr screen     Status: None   Collection Time: 10/31/15  2:02 AM  Result Value Ref Range Status   MRSA, PCR NEGATIVE NEGATIVE Final   Staphylococcus aureus NEGATIVE NEGATIVE Final    Comment:        The Xpert SA Assay (FDA approved for NASAL specimens in patients over 21 years of  age), is one component of a comprehensive surveillance program.  Test performance has been validated by  [facility] for patients greater than or equal to 65 year old. It is not intended to diagnose infection nor to guide or monitor treatment.   Culture, blood (routine x 2)     Status: None   Collection Time: 11/03/15  4:14 PM  Result Value Ref Range Status   Specimen Description BLOOD LEFT HAND  Final   Special Requests IN PEDIATRIC BOTTLE 2CC  Final   Culture NO GROWTH 5 DAYS  Final   Report Status 11/08/2015 FINAL  Final  Culture, blood (routine x 2)     Status: None   Collection Time: 11/03/15  4:17 PM  Result Value Ref Range Status   Specimen Description BLOOD LEFT WRIST  Final   Special Requests BOTTLES DRAWN AEROBIC ONLY 5CC  Final   Culture NO GROWTH 5 DAYS  Final   Report Status 11/08/2015 FINAL  Final  Urine culture     Status: None   Collection Time: 11/03/15  4:17 PM  Result Value Ref Range Status   Specimen Description URINE, RANDOM  Final   Special Requests NONE  Final   Culture NO GROWTH  Final   Report Status 11/05/2015 FINAL  Final  Urine culture     Status: None   Collection Time: 11/03/15  7:04 PM  Result Value Ref Range Status   Specimen Description URINE, CATHETERIZED  Final   Special Requests NONE  Final   Culture NO GROWTH  Final   Report Status 11/04/2015 FINAL  Final  C difficile quick scan w PCR reflex     Status: None   Collection Time: 11/05/15  4:21 PM  Result Value Ref Range Status   C Diff antigen NEGATIVE NEGATIVE Final   C Diff toxin NEGATIVE NEGATIVE Final   C Diff interpretation No C. difficile detected.  Final    Studies/Results: No results found.    Assessment/Plan:  INTERVAL HISTORY:  Sp VVS surgery with bypass graft  Patient with confusion, earlier LUE weakness on Dr. Adele Danickson's exam later dropped hemoglobin to 5.8, later developed shock intubated and sent to the ICU  Over the weekend she has been successfully extubated  and now is hemodynamically stable she is doing much better neurologically as well  Since time of my visit this ring she has now undergone debridement and the operative room and Dr. Lajoyce Cornersuda and cultures have been sent  Principal Problem:   Osteomyelitis of left foot (HCC) Active Problems:   Diabetes mellitus type II, uncontrolled (HCC)   Hyperlipidemia with target LDL less than 70   PVD (peripheral vascular disease) with claudication (HCC)   Essential hypertension   Malnutrition of moderate degree   Diabetic foot ulcer (HCC)   S/P femoral-popliteal bypass surgery   Septic shock (HCC)   Encephalopathy acute   Acute blood loss anemia   Acute respiratory failure with hypoxia (HCC)   CVA (cerebral vascular accident) (HCC)   Central line infection, initial encounter   Demand myocardial infarction   Surgery, elective   Traumatic hemorrhagic shock (HCC)   Chronic systolic CHF (congestive heart failure) (HCC)   Uncontrolled type 2 diabetes mellitus with complication (HCC)    [name] [name] is a 65 y.o. female with with  Poorly controlled diabetes mellitus with peripheral vascular disease diabetic foot ulcers arterial disease and osteomyelitis of third fourth and fifth digits sp Bypass surgery but unfortunately with deterioration postoperatively with confusion, drop in hemoglobin, and shock sp intubation   #1 Diabetic foot ulcers with osteomyelitis then with  shock:  Expect much of the shock may have been due to blood loss rather than infection but I will leave her on her current broad-spectrum antibiotics of vancomycin and cefepime  She is now status post debridement of her foot ulcer but not amputation of her areas of osteomyelitis  Continue vancomycin and cefepime for now  Follow-up cultures  I would give her an 8 week course of IV antibiotics with close monitoring of her wound and her areas of osteomyelitis.  I do not think she is going to heal osteomyelitis in her toes given her  underlying pathology.  We may need to consider chronic oral suppressive therapy if she is not willing to undergo imitation of the toes  #2 ? CVA: wonder if the patient had a hypoperfusion related CNS event due to blood loss +/- infection  I will follow peripherally in the meantime.  Dr. Drue Second is covering this weekend and will take over service the second half of October      LOS: 11 days   Acey Lav 11/10/2015, 5:20 PM

## 2015-11-10 NOTE — Progress Notes (Signed)
  Pt catheter was removed by first shift nurse, pt has been able to void, will continue to monitor

## 2015-11-10 NOTE — Progress Notes (Signed)
PT Cancellation Note  Patient Details Name: [name] [name] MRN: [id number] DOB: 06/30/1950   Cancelled Treatment:    Reason Eval/Treat Not Completed: Patient declined, no reason specifiedPt declined. Going to OR at 1pm today. PT will continue to follow acutely.    Derek Mound, PTA Pager: [phone]   11/10/2015, 12:09 PM

## 2015-11-11 LAB — GLUCOSE, CAPILLARY
GLUCOSE-CAPILLARY: 138 mg/dL — AB (ref 65–99)
GLUCOSE-CAPILLARY: 161 mg/dL — AB (ref 65–99)
Glucose-Capillary: 122 mg/dL — ABNORMAL HIGH (ref 65–99)
Glucose-Capillary: 174 mg/dL — ABNORMAL HIGH (ref 65–99)

## 2015-11-11 MED ORDER — FLUCONAZOLE 100 MG PO TABS
100.0000 mg | ORAL_TABLET | Freq: Every day | ORAL | Status: DC
  Administered 2015-11-11 – 2015-11-14 (×4): 100 mg via ORAL
  Filled 2015-11-11 (×4): qty 1

## 2015-11-11 NOTE — Clinical Social Work Note (Signed)
Clinical Social Work Assessment  Patient Details  Name: [name] [name] MRN: [id number] Date of Birth: 11/01/1950  Date of referral:  11/10/15               Reason for consult:  Facility Placement, Discharge Planning                Permission sought to share information with:  Facility Sport and exercise psychologist, Family Supports Permission granted to share information::  Yes, Verbal Permission Granted  Name::     [name] [name]  Agency::  [facility]  Relationship::  Daughter  Contact Information:  [phone]  Housing/Transportation Living arrangements for the past 2 months:  Single Family Home Source of Information:  Patient, Medical Team Patient Interpreter Needed:  None Criminal Activity/Legal Involvement Pertinent to Current Situation/Hospitalization:  No - Comment as needed Significant Relationships:  Adult Children Lives with:  Adult Children Do you feel safe going back to the place where you live?  Yes Need for family participation in patient care:  Yes (Comment)  Care giving concerns:  PT recommending SNF once medically stable for discharge.   Social Worker assessment / plan:  CSW met with patient. No supports at bedside. CSW introduced role and explained that PT recommendations would be discussed. Patient agreeable to SNF placement. CSW provided bed offers. Patient wants to discuss with her daughter and should have decision made by tomorrow 10/15. Discussed how insurance pays. No further concerns. CSW encouraged patient to contact CSW as needed. CSW will continue to follow patient for support and facilitate discharge to SNF once medically stable.  Employment status:  Retired Forensic scientist:  Medicare PT Recommendations:  [place] / Referral to community resources:  [place]  Patient/Family's Response to care:  Patient agreeable to SNF placement. Patient's daughter supportive and involved in patient's care. Patient appreciated social  work intervention.  Patient/Family's Understanding of and Emotional Response to Diagnosis, Current Treatment, and Prognosis:  Patient understands the need for rehab prior to returning home. Patient appears happy with hospital care.  Emotional Assessment Appearance:  Appears stated age Attitude/Demeanor/Rapport:  Other (Pleasant) Affect (typically observed):  Accepting, Appropriate, Calm, Pleasant Orientation:  Oriented to Self, Oriented to Place, Oriented to  Time, Oriented to Situation Alcohol / Substance use:  Tobacco Use Psych involvement (Current and /or in the community):  No (Comment)  Discharge Needs  Concerns to be addressed:  Care Coordination Readmission within the last 30 days:  No Current discharge risk:  Dependent with Mobility Barriers to Discharge:  No Barriers Identified   Candie Chroman, LCSW 11/11/2015, 9:30 AM

## 2015-11-11 NOTE — Progress Notes (Signed)
PROGRESS NOTE                                                                                                                                                                                                             Patient Demographics:    [name] [name], is a 65 y.o. female, DOB - December 13, 1950, ZOX:[id number]  Admit date - 10/30/2015   Admitting Physician Jonah Blue, MD  Outpatient Primary MD for the patient is Georgann Housekeeper, MD  LOS - 12  Outpatient Specialists: NONE  Chief Complaint  Patient presents with  . foot infection-[facility]       Brief Narrative   65 year old female with history of diabetes mellitus peripheral vascular disease status post revascularization, history of MI, hypertension, hyperlipidemia, peripheral neuropathy resented with left foot ostium myelitis admitted on 10/2 and underwent left fem-pop bypass on 11/02/15. She was evaluated by orthopedics who recommended surgical debridement. However on 10/60 tablet acute slurred speech with left-sided weakness and encephalopathy and then went into shock. She was also found to have drop in her blood 5.8 secondary to bleeding into her left thigh. She was intubated for airway protection and successfully extubated the next day. Hemoglobin improved with transfusion. She also developed NSTEMI. Patient was taken to or for surgical debridement of the osteo-myelitis/diabetic foot ulcer on 10/13.   Subjective Denies any pain in her left foot this morning. No overnight issues.   Assessment  & Plan :    Principal Problem:   Osteomyelitis of left foot (HCC) Secondary to uncontrolled diabetes with diabetic foot ulcer and peripheral vascular disease Empiric vancomycin and cefepime. Pending final culture. Pain control with when necessary IV fentanyl. -Left foot wound VAC, orthopedics closely following. -Antibiotic course per ID. (Will need at least 6  weeks course)  Active Problems: Acute hypoxemic respiratory failure Commonly seen of hemorrhagic/septic shock,NSTEMI, infection and narcotics. Now resolved.  Septic shock Result. Empiric antibiotics.    Diabetes mellitus type II, uncontrolled (HCC) History of neuropathy and peripheral vascular disease. A1c of 7. Continue Lantus 30 daily   Acute encephalopathy with possible right brainstem/subcortical infarct Head CT unremarkable. Appreciate neurology consult. No focal neurological deficit. Continue antiplatelet therapy.  NSTEMI chronic systolic CHF Continue Plavix and aspirin. Continue statin and beta blocker. Echo shows normal EF with grade 1 diastolic dysfunction.  Hemorrhagic shock secondary  to bleeding into left thigh Hemoglobin is stable after PRBC transfusion.     PVD (peripheral vascular disease) with claudication (HCC) S/p fem-pop bypass.        Code Status : Full code  Family Communication  : None at bedside  Disposition Plan  : Possibly needs nursing facility  Barriers For Discharge : Active symptoms  Consults  :   Vascular surgery Orthopedics ID PC CM (signed off)  Procedures  :  Left Fem pop bypass Debridement of left foot ulcer and appendectomy occasional wound VAC Intubation 2-D echo  DVT Prophylaxis  :  SCDs  Lab Results  Component Value Date   PLT 414 (H) 11/09/2015    Antibiotics  :   Anti-infectives    Start     Dose/Rate Route Frequency Ordered Stop   11/11/15 1145  fluconazole (DIFLUCAN) tablet 100 mg     100 mg Oral Daily 11/11/15 1141     11/10/15 1420  clindamycin (CLEOCIN) 900 MG/50ML IVPB    Comments:  Rock, Jennifer   : cabinet override      11/10/15 1420 11/10/15 1452   11/10/15 0600  clindamycin (CLEOCIN) IVPB 900 mg     900 mg 100 mL/hr over 30 Minutes Intravenous To Short Stay 11/09/15 1723 11/10/15 1452   11/09/15 0746  clindamycin (CLEOCIN) IVPB 900 mg  Status:  Discontinued     900 mg 100 mL/hr over 30 Minutes  Intravenous On call to O.R. 11/09/15 0747 11/09/15 0754   11/06/15 1900  vancomycin (VANCOCIN) IVPB 1000 mg/200 mL premix     1,000 mg 200 mL/hr over 60 Minutes Intravenous Every 12 hours 11/06/15 1044     11/03/15 2000  ceFEPIme (MAXIPIME) 2 g in dextrose 5 % 50 mL IVPB     2 g 100 mL/hr over 30 Minutes Intravenous Every 12 hours 11/03/15 1913     11/03/15 1900  vancomycin (VANCOCIN) IVPB 750 mg/150 ml premix  Status:  Discontinued     750 mg 150 mL/hr over 60 Minutes Intravenous Every 12 hours 11/03/15 1743 11/06/15 1044   11/03/15 1900  aztreonam (AZACTAM) 1 g in dextrose 5 % 50 mL IVPB  Status:  Discontinued     1 g 100 mL/hr over 30 Minutes Intravenous Every 8 hours 11/03/15 1743 11/03/15 1909   11/02/15 0600  vancomycin (VANCOCIN) IVPB 1000 mg/200 mL premix  Status:  Discontinued     1,000 mg 200 mL/hr over 60 Minutes Intravenous On call to O.R. 11/01/15 0854 11/01/15 0910   11/01/15 1300  cefTRIAXone (ROCEPHIN) 1 g in dextrose 5 % 50 mL IVPB     1 g 100 mL/hr over 30 Minutes Intravenous  Once 11/01/15 1215 11/01/15 1407   10/31/15 0600  vancomycin (VANCOCIN) IVPB 750 mg/150 ml premix  Status:  Discontinued     750 mg 150 mL/hr over 60 Minutes Intravenous Every 12 hours 10/31/15 0156 11/02/15 1828   10/31/15 0600  aztreonam (AZACTAM) 1 g in dextrose 5 % 50 mL IVPB  Status:  Discontinued     1 g 100 mL/hr over 30 Minutes Intravenous Every 8 hours 10/31/15 0210 11/01/15 1215   10/31/15 0045  aztreonam (AZACTAM) 2 g in dextrose 5 % 50 mL IVPB     2 g 100 mL/hr over 30 Minutes Intravenous  Once 10/31/15 0033 10/31/15 0131   10/30/15 2030  vancomycin (VANCOCIN) 1,500 mg in sodium chloride 0.9 % 500 mL IVPB     1,500 mg 250 mL/hr over  120 Minutes Intravenous  Once 10/30/15 2020 10/30/15 2335   10/30/15 2030  levofloxacin (LEVAQUIN) IVPB 750 mg     750 mg 100 mL/hr over 90 Minutes Intravenous  Once 10/30/15 2020 10/30/15 2231        Objective:   Vitals:   11/10/15 1802 11/10/15  2143 11/11/15 0605 11/11/15 1416  BP: (!) 144/61 (!) 133/56 (!) 128/58 (!) 132/49  Pulse: 89 (!) 101 84 84  Resp:  18 (!) 1 20  Temp:  98.7 F (37.1 C)  98.4 F (36.9 C)  TempSrc:  Oral Oral Oral  SpO2: 99% 97% 95% 99%  Weight:   72.5 kg (159 lb 13.3 oz)   Height:        Wt Readings from Last 3 Encounters:  11/11/15 72.5 kg (159 lb 13.3 oz)  09/29/15 77.2 kg (170 lb 3.1 oz)  09/20/15 74.4 kg (164 lb)     Intake/Output Summary (Last 24 hours) at 11/11/15 1534 Last data filed at 11/11/15 1300  Gross per 24 hour  Intake          1695.83 ml  Output              140 ml  Net          1555.83 ml     Physical Exam  Gen: not in distress HEENT: pallor+, moist mucosa, supple neck Chest: clear b/l, no added sounds CVS: N S1&S2, no murmurs, rubs or gallop GI: soft, NT, ND, BS+ Musculoskeletal: warm, wound vac over left foot CNS: Alert and oriented, nonfocal    Data Review:    CBC  Recent Labs Lab 11/05/15 2236 11/06/15 1345 11/07/15 0447 11/08/15 0530 11/09/15 1300  WBC 10.8* 9.6 8.7 11.8* 12.2*  HGB 7.8* 8.6* 8.0* 8.3* 8.7*  HCT 24.3* 26.3* 24.7* 25.0* 26.4*  PLT 276 325 321 448* 414*  MCV 88.0 88.6 88.8 85.9 87.1  MCH 28.3 29.0 28.8 28.5 28.7  MCHC 32.1 32.7 32.4 33.2 33.0  RDW 15.0 15.2 15.2 15.5 15.3  LYMPHSABS  --   --   --  1.8  --   MONOABS  --   --   --  1.2*  --   EOSABS  --   --   --  0.3  --   BASOSABS  --   --   --  0.0  --     Chemistries   Recent Labs Lab 11/05/15 0458 11/06/15 0600 11/07/15 0447 11/08/15 0530 11/09/15 1300  NA 140  --  140 140 138  K 3.3*  --  3.4* 3.1* 3.9  CL 105  --  106 105 105  CO2 27  --  27 27 26   GLUCOSE 96  --  64* 51* 167*  BUN 9  --  <5* 5* 9  CREATININE 0.47 0.43* 0.43* 0.41* 0.51  CALCIUM 8.1*  --  8.2* 8.5* 8.5*  AST 27  --  22  --   --   ALT 12*  --  15  --   --   ALKPHOS 74  --  85  --   --   BILITOT 0.4  --  0.6  --   --     ------------------------------------------------------------------------------------------------------------------ No results for input(s): CHOL, HDL, LDLCALC, TRIG, CHOLHDL, LDLDIRECT in the last 72 hours.  Lab Results  Component Value Date   HGBA1C 7.6 (H) 11/02/2015   ------------------------------------------------------------------------------------------------------------------ No results for input(s): TSH, T4TOTAL, T3FREE, THYROIDAB in the last 72 hours.  Invalid input(s):  FREET3 ------------------------------------------------------------------------------------------------------------------ No results for input(s): VITAMINB12, FOLATE, FERRITIN, TIBC, IRON, RETICCTPCT in the last 72 hours.  Coagulation profile No results for input(s): INR, PROTIME in the last 168 hours.  No results for input(s): DDIMER in the last 72 hours.  Cardiac Enzymes No results for input(s): CKMB, TROPONINI, MYOGLOBIN in the last 168 hours.  Invalid input(s): CK ------------------------------------------------------------------------------------------------------------------ No results found for: BNP  Inpatient Medications  Scheduled Meds: . aspirin EC  81 mg Oral Daily  . atorvastatin  80 mg Oral Daily  . ceFEPime (MAXIPIME) IV  2 g Intravenous Q12H  . clopidogrel  75 mg Oral Daily  . diphenhydrAMINE  50 mg Oral Once  . feeding supplement (PRO-STAT SUGAR FREE 64)  30 mL Oral BID  . fluconazole  100 mg Oral Daily  . gabapentin  300 mg Oral TID  . insulin aspart  0-15 Units Subcutaneous TID WC  . insulin glargine  35 Units Subcutaneous Q2200  . metoprolol tartrate  50 mg Oral BID  . pantoprazole  40 mg Oral Q1200  . polyethylene glycol  17 g Oral Daily  . senna-docusate  1 tablet Oral QHS  . vancomycin  1,000 mg Intravenous Q12H   Continuous Infusions: . sodium chloride 10 mL/hr at 11/10/15 1734   PRN Meds:.acetaminophen **OR** acetaminophen, alum & mag hydroxide-simeth, bisacodyl,  fentaNYL (SUBLIMAZE) injection, guaiFENesin-dextromethorphan, magnesium hydroxide, methocarbamol **OR** methocarbamol (ROBAXIN)  IV, metoCLOPramide **OR** metoCLOPramide (REGLAN) injection, ondansetron, ondansetron **OR** ondansetron (ZOFRAN) IV, oxyCODONE, phenol, sodium chloride flush  Micro Results Recent Results (from the past 240 hour(s))  Culture, blood (routine x 2)     Status: None   Collection Time: 11/03/15  4:14 PM  Result Value Ref Range Status   Specimen Description BLOOD LEFT HAND  Final   Special Requests IN PEDIATRIC BOTTLE 2CC  Final   Culture NO GROWTH 5 DAYS  Final   Report Status 11/08/2015 FINAL  Final  Culture, blood (routine x 2)     Status: None   Collection Time: 11/03/15  4:17 PM  Result Value Ref Range Status   Specimen Description BLOOD LEFT WRIST  Final   Special Requests BOTTLES DRAWN AEROBIC ONLY 5CC  Final   Culture NO GROWTH 5 DAYS  Final   Report Status 11/08/2015 FINAL  Final  Urine culture     Status: None   Collection Time: 11/03/15  4:17 PM  Result Value Ref Range Status   Specimen Description URINE, RANDOM  Final   Special Requests NONE  Final   Culture NO GROWTH  Final   Report Status 11/05/2015 FINAL  Final  Urine culture     Status: None   Collection Time: 11/03/15  7:04 PM  Result Value Ref Range Status   Specimen Description URINE, CATHETERIZED  Final   Special Requests NONE  Final   Culture NO GROWTH  Final   Report Status 11/04/2015 FINAL  Final  C difficile quick scan w PCR reflex     Status: None   Collection Time: 11/05/15  4:21 PM  Result Value Ref Range Status   C Diff antigen NEGATIVE NEGATIVE Final   C Diff toxin NEGATIVE NEGATIVE Final   C Diff interpretation No C. difficile detected.  Final  Aerobic/Anaerobic Culture (surgical/deep wound)     Status: None (Preliminary result)   Collection Time: 11/10/15  3:07 PM  Result Value Ref Range Status   Specimen Description TISSUE  Final   Special Requests   Final    LEFT FOOT  ULCER TISSUE ON SWAB PATIENT ON FOLLOWING VANC AND MAXIPIME   Gram Stain   Final    ABUNDANT WBC PRESENT, PREDOMINANTLY PMN NO ORGANISMS SEEN    Culture CULTURE REINCUBATED FOR BETTER GROWTH  Final   Report Status PENDING  Incomplete  Aerobic/Anaerobic Culture (surgical/deep wound)     Status: None (Preliminary result)   Collection Time: 11/10/15  3:07 PM  Result Value Ref Range Status   Specimen Description TISSUE  Final   Special Requests   Final    LEFT FOOT ULCER PATIENT ON FOLLOWING VANC AND MAXIPIME   Gram Stain   Final    ABUNDANT WBC PRESENT, PREDOMINANTLY PMN RARE GRAM POSITIVE COCCI IN PAIRS    Culture   Final    RARE STREPTOCOCCUS AGALACTIAE CULTURE REINCUBATED FOR BETTER GROWTH    Report Status PENDING  Incomplete    Radiology Reports Ct Angio Head W Or Wo Contrast  Result Date: 11/03/2015 CLINICAL DATA:  Left-sided weakness and aphasia. EXAM: CT ANGIOGRAPHY HEAD AND NECK CT CEREBRAL PERFUSION WITH CONTRAST TECHNIQUE: Multidetector CT imaging of the head and neck was performed using the standard protocol during bolus administration of intravenous contrast. Multiplanar CT image reconstructions and MIPs were obtained to evaluate the vascular anatomy. Carotid stenosis measurements (when applicable) are obtained utilizing NASCET criteria, using the distal internal carotid diameter as the denominator. CT cerebral perfusion imaging was also performed during the dynamic injection of intravenous contrast. Post processing was performed. CONTRAST:  90 mL Isovue 370 COMPARISON:  Head MRI/ MRA 09/20/2013 FINDINGS: CTA NECK FINDINGS Aortic arch: Three vessel aortic arch with extensive atherosclerotic calcified plaque. There is approximately 60% stenosis of the proximal brachiocephalic artery and proximal right subclavian artery. Atherosclerotic plaque in a focal when or intimal flap in the proximal left subclavian artery contribute to 65% stenosis. Right carotid system: Common carotid  artery is patent without stenosis. Internal carotid artery is occluded at its origin without reconstitution in the neck. There is mild narrowing of the ECA origin. Left carotid system: Moderate atherosclerotic plaque at the carotid bifurcation without significant common or internal carotid artery stenosis. Mild proximal ECA stenosis. Vertebral arteries: Vertebral arteries are patent and codominant. There is mild right V2 narrowing at the C2 and C5 levels due to calcified plaque. There is mild left V2 narrowing due to degenerative vertebral spurring at C5-6 and C6-7. Skeleton: Moderate to advanced disc degeneration from C4-5 to C6-7. Other neck: 8 mm calculus in the distal left submandibular duct without significant ductal dilatation or inflammatory change. Small left thyroid nodule. Upper chest: Interlobular septal thickening in the lung apices with partially visualized ground-glass opacity posteriorly in the left upper lobe and superior segment segments of the left greater than right upper lobes. Review of the MIP images confirms the above findings CTA HEAD FINDINGS Anterior circulation: Chronically occluded right ICA with reconstitution of the terminus via a small but patent right A1 segment, unchanged. Right M1 segment is widely patent. There is at most mild narrowing of the proximal right M2 inferior division. Decreased number of distal right MCA branch vessels in the right parietal region is chronic. Intracranial left ICA is patent with extensive atherosclerosis though only resulting in at most mild stenosis. 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 Left A1 segment is widely patent, as is the anterior communicating artery. A2 segments are patent. Left MCA is patent without evidence of major branch occlusion  or significant proximal stenosis. Posterior circulation: Intracranial vertebral arteries  are patent to the basilar with prominent atherosclerosis, greater on the right. There is severe proximal right V4 stenosis with additional moderate tandem stenoses. PICA origins appear patent, potentially with a high-grade stenosis on the right. AICA and SCA origins are patent. Basilar artery is patent without stenosis. Posterior communicating arteries are not identified. PCAs are patent with mild distal left P1 narrowing. No aneurysm. Venous sinuses: Patent. Anatomic variants: Small right A1. Review of the MIP images confirms the above findings CT CEREBRAL PERFUSION FINDINGS There is mildly prolonged transit time throughout most of the right MCA territory, with slightly greater delayed transit (6 seconds greater than the contralateral side) in the deep watershed territory/centrum semiovale. Cerebral blood flow is preserved. Cerebral blood volume is also normal to mildly increased. No diminished CBF/CBV suggestive of acute infarction is seen. IMPRESSION: 1. 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 2. Prolonged transit time throughout the right MCA territory due to #1 with compensated cerebral blood flow parameters. No evidence of acute infarct. 3. Chronically decreased number of distal right MCA branch vessels. No evidence of acute major branch occlusion. 4. Severe right V4 vertebral artery stenosis. 5. 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 6. Aortic atherosclerosis. 7. 8 mm left submandibular duct calculus. 8. Interlobular septal thickening and ground-glass opacities in lung apices, possibly reflecting edema. Correlate with chest radiography. These results were called by telephone at the time of interpretation on 11/03/2015 at 4:00 pm to Dr. Desmond Lope, who verbally acknowledged these results. Electronically Signed   By: Sebastian Ache M.D.   On: 11/03/2015 16:31   Ct Angio Neck W Or Wo Contrast  Result Date: 11/03/2015 CLINICAL DATA:  Left-sided  weakness and aphasia. EXAM: CT ANGIOGRAPHY HEAD AND NECK CT CEREBRAL PERFUSION WITH CONTRAST TECHNIQUE: Multidetector CT imaging of the head and neck was performed using the standard protocol during bolus administration of intravenous contrast. Multiplanar CT image reconstructions and MIPs were obtained to evaluate the vascular anatomy. Carotid stenosis measurements (when applicable) are obtained utilizing NASCET criteria, using the distal internal carotid diameter as the denominator. CT cerebral perfusion imaging was also performed during the dynamic injection of intravenous contrast. Post processing was performed. CONTRAST:  90 mL Isovue 370 COMPARISON:  Head MRI/ MRA 09/20/2013 FINDINGS: CTA NECK FINDINGS Aortic arch: Three vessel aortic arch with extensive atherosclerotic calcified plaque. There is approximately 60% stenosis of the proximal brachiocephalic artery and proximal right subclavian artery. Atherosclerotic plaque in a focal when or intimal flap in the proximal left subclavian artery contribute to 65% stenosis. Right carotid system: Common carotid artery is patent without stenosis. Internal carotid artery is occluded at its origin without reconstitution in the neck. There is mild narrowing of the ECA origin. Left carotid system: Moderate atherosclerotic plaque at the carotid bifurcation without significant common or internal carotid artery stenosis. Mild proximal ECA stenosis. Vertebral arteries: Vertebral arteries are patent and codominant. There is mild right V2 narrowing at the C2 and C5 levels due to calcified plaque. There is mild left V2 narrowing due to degenerative vertebral spurring at C5-6 and C6-7. Skeleton: Moderate to advanced disc degeneration from C4-5 to C6-7. Other neck: 8 mm calculus in the distal left submandibular duct without significant ductal dilatation or inflammatory change. Small left thyroid nodule. Upper chest: Interlobular septal thickening in the lung apices with partially  visualized ground-glass opacity posteriorly in the left upper lobe and superior segment segments of the left greater  than right upper lobes. Review of the MIP images confirms the above findings CTA HEAD FINDINGS Anterior circulation: Chronically occluded right ICA with reconstitution of the terminus via a small but patent right A1 segment, unchanged. Right M1 segment is widely patent. There is at most mild narrowing of the proximal right M2 inferior division. Decreased number of distal right MCA branch vessels in the right parietal region is chronic. Intracranial left ICA is patent with extensive atherosclerosis though only resulting in at most mild stenosis. 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 Left A1 segment is widely patent, as is the anterior communicating artery. A2 segments are patent. Left MCA is patent without evidence of major branch occlusion or significant proximal stenosis. Posterior circulation: Intracranial vertebral arteries are patent to the basilar with prominent atherosclerosis, greater on the right. There is severe proximal right V4 stenosis with additional moderate tandem stenoses. PICA origins appear patent, potentially with a high-grade stenosis on the right. AICA and SCA origins are patent. Basilar artery is patent without stenosis. Posterior communicating arteries are not identified. PCAs are patent with mild distal left P1 narrowing. No aneurysm. Venous sinuses: Patent. Anatomic variants: Small right A1. Review of the MIP images confirms the above findings CT CEREBRAL PERFUSION FINDINGS There is mildly prolonged transit time throughout most of the right MCA territory, with slightly greater delayed transit (6 seconds greater than the contralateral side) in the deep watershed territory/centrum semiovale. Cerebral blood flow is preserved. Cerebral blood volume is also  normal to mildly increased. No diminished CBF/CBV suggestive of acute infarction is seen. IMPRESSION: 1. 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 2. Prolonged transit time throughout the right MCA territory due to #1 with compensated cerebral blood flow parameters. No evidence of acute infarct. 3. Chronically decreased number of distal right MCA branch vessels. No evidence of acute major branch occlusion. 4. Severe right V4 vertebral artery stenosis. 5. 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 6. Aortic atherosclerosis. 7. 8 mm left submandibular duct calculus. 8. Interlobular septal thickening and ground-glass opacities in lung apices, possibly reflecting edema. Correlate with chest radiography. These results were called by telephone at the time of interpretation on 11/03/2015 at 4:00 pm to Dr. Desmond Lope, who verbally acknowledged these results. Electronically Signed   By: Sebastian Ache M.D.   On: 11/03/2015 16:31   Mr Foot Left W Wo Contrast  Result Date: 11/02/2015 CLINICAL DATA:  Diabetic foot ulcer, assessment for osteomyelitis. EXAM: MRI OF THE LEFT FOREFOOT WITHOUT AND WITH CONTRAST TECHNIQUE: Multiplanar, multisequence MR imaging was performed both before and after administration of intravenous contrast. CONTRAST:  15mL MULTIHANCE GADOBENATE DIMEGLUMINE 529 MG/ML IV SOLN COMPARISON:  10/30/2015 FINDINGS: Osteomyelitis protocol MRI of the foot was obtained, to include the entire foot and ankle. This protocol uses a large field of view to cover the entire foot and ankle, and is suitable for assessing bony structures for osteomyelitis. Due to the large field of view and imaging plane choice, this protocol is less sensitive for assessing small structures such as ligamentous structures of the foot and ankle, compared to a dedicated forefoot or dedicated hindfoot exam. Despite efforts by the technologist and patient, motion artifact is present on  today's exam and could not be eliminated. This reduces exam sensitivity and specificity. Abnormal edema and enhancement involving the distal head and metaphysis of the fifth metatarsal and adjacent proximal half of  the small toe proximal phalanx compatible with osteomyelitis. Lateral ulceration along the fifth MTP joint probably communicates with the joint on image 33/6. Extensive subcutaneous edema along the plantar foot at the level of the midfoot and forefoot, although I do not see a drainable abscess this may well be spontaneously draining to the plantar foot given the overlying skin irregularity. There is edema and likely some enhancement along the plantar foot musculature. Thickened proximal plantar fascia. Dorsal subcutaneous edema in the foot. IMPRESSION: 1. Ulceration lateral to the fifth MTP joint appears to extend all the way down into the joint. There is osteomyelitis distally in the fifth metatarsal and also in the proximal phalanx of the fifth toe. No other osteomyelitis in the foot is identified. 2. Suspected ulcerations along the plantar foot with confluent phlegmon tracking in the plantar foot, primarily within the subcutaneous tissues but also along the deep margin of the medial band of the plantar fascia. I suspected any early abscess in this region is spontaneously draining. 3. Cellulitis along the dorsum of the foot. Electronically Signed   By: Gaylyn Rong M.D.   On: 11/02/2015 07:31   Ct Cerebral Perfusion W Contrast  Result Date: 11/03/2015 CLINICAL DATA:  Left-sided weakness and aphasia. EXAM: CT ANGIOGRAPHY HEAD AND NECK CT CEREBRAL PERFUSION WITH CONTRAST TECHNIQUE: Multidetector CT imaging of the head and neck was performed using the standard protocol during bolus administration of intravenous contrast. Multiplanar CT image reconstructions and MIPs were obtained to evaluate the vascular anatomy. Carotid stenosis measurements (when applicable) are obtained utilizing NASCET  criteria, using the distal internal carotid diameter as the denominator. CT cerebral perfusion imaging was also performed during the dynamic injection of intravenous contrast. Post processing was performed. CONTRAST:  90 mL Isovue 370 COMPARISON:  Head MRI/ MRA 09/20/2013 FINDINGS: CTA NECK FINDINGS Aortic arch: Three vessel aortic arch with extensive atherosclerotic calcified plaque. There is approximately 60% stenosis of the proximal brachiocephalic artery and proximal right subclavian artery. Atherosclerotic plaque in a focal when or intimal flap in the proximal left subclavian artery contribute to 65% stenosis. Right carotid system: Common carotid artery is patent without stenosis. Internal carotid artery is occluded at its origin without reconstitution in the neck. There is mild narrowing of the ECA origin. Left carotid system: Moderate atherosclerotic plaque at the carotid bifurcation without significant common or internal carotid artery stenosis. Mild proximal ECA stenosis. Vertebral arteries: Vertebral arteries are patent and codominant. There is mild right V2 narrowing at the C2 and C5 levels due to calcified plaque. There is mild left V2 narrowing due to degenerative vertebral spurring at C5-6 and C6-7. Skeleton: Moderate to advanced disc degeneration from C4-5 to C6-7. Other neck: 8 mm calculus in the distal left submandibular duct without significant ductal dilatation or inflammatory change. Small left thyroid nodule. Upper chest: Interlobular septal thickening in the lung apices with partially visualized ground-glass opacity posteriorly in the left upper lobe and superior segment segments of the left greater than right upper lobes. Review of the MIP images confirms the above findings CTA HEAD FINDINGS Anterior circulation: Chronically occluded right ICA with reconstitution of the terminus via a small but patent right A1 segment, unchanged. Right M1 segment is widely patent. There is at most mild  narrowing of the proximal right M2 inferior division. Decreased number of distal right MCA branch vessels in the right parietal region is chronic. Intracranial left ICA is patent with extensive atherosclerosis though only resulting in at most mild stenosis. 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 Left A1 segment is widely patent, as is the anterior communicating artery. A2 segments are patent. Left MCA is patent without evidence of major branch occlusion or significant proximal stenosis. Posterior circulation: Intracranial vertebral arteries are patent to the basilar with prominent atherosclerosis, greater on the right. There is severe proximal right V4 stenosis with additional moderate tandem stenoses. PICA origins appear patent, potentially with a high-grade stenosis on the right. AICA and SCA origins are patent. Basilar artery is patent without stenosis. Posterior communicating arteries are not identified. PCAs are patent with mild distal left P1 narrowing. No aneurysm. Venous sinuses: Patent. Anatomic variants: Small right A1. Review of the MIP images confirms the above findings CT CEREBRAL PERFUSION FINDINGS There is mildly prolonged transit time throughout most of the right MCA territory, with slightly greater delayed transit (6 seconds greater than the contralateral side) in the deep watershed territory/centrum semiovale. Cerebral blood flow is preserved. Cerebral blood volume is also normal to mildly increased. No diminished CBF/CBV suggestive of acute infarction is seen. IMPRESSION: 1. 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 2. Prolonged transit time throughout the right MCA territory due to #1 with compensated cerebral blood flow parameters. No evidence of acute infarct. 3. Chronically decreased number of distal right MCA branch vessels. No  evidence of acute major branch occlusion. 4. Severe right V4 vertebral artery stenosis. 5. 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 6. Aortic atherosclerosis. 7. 8 mm left submandibular duct calculus. 8. Interlobular septal thickening and ground-glass opacities in lung apices, possibly reflecting edema. Correlate with chest radiography. These results were called by telephone at the time of interpretation on 11/03/2015 at 4:00 pm to Dr. Desmond Lope, who verbally acknowledged these results. Electronically Signed   By: Sebastian Ache M.D.   On: 11/03/2015 16:31   Dg Chest Port 1 View  Result Date: 11/05/2015 CLINICAL DATA:  Hypoxia EXAM: PORTABLE CHEST 1 VIEW COMPARISON:  November 03, 2015 FINDINGS: Endotracheal tube and nasogastric tube have been removed. Central catheter tip is in the superior vena cava. No pneumothorax. There is a focal area of consolidation in the right upper lobe medially. There is right base atelectasis. Lungs elsewhere clear. Heart is upper normal in size with pulmonary vascularity within normal limits. There is atherosclerotic calcification in the aorta. No adenopathy. IMPRESSION: Small area of airspace consolidation in the right upper lobe medially, likely infiltrate. Mild right base atelectasis. Lungs elsewhere clear. Stable cardiac silhouette. Aortic atherosclerosis. No pneumothorax. Electronically Signed   By: Bretta Bang III M.D.   On: 11/05/2015 10:56   Dg Chest Port 1 View  Result Date: 11/03/2015 CLINICAL DATA:  Right central venous line placement EXAM: PORTABLE CHEST 1 VIEW COMPARISON:  08/18/2015 chest radiograph. FINDINGS: Right internal jugular central venous catheter terminates in the upper third of the superior vena cava. Stable cardiomediastinal silhouette with normal heart size with aortic atherosclerosis. No pneumothorax. No pleural effusion. Mild diffuse prominence of the central interstitial markings. No acute consolidative airspace disease.  IMPRESSION: 1. Right internal jugular central venous catheter terminates in the upper third of the superior vena cava. No pneumothorax. 2. Top-normal heart size. Prominence of the central interstitial markings, favor vascular crowding due to low lung volumes . 3. Aortic atherosclerosis. Electronically Signed   By: Delbert Phenix M.D.   On: 11/03/2015 17:31   Dg Chest Port 1v Same Day  Result Date:  11/03/2015 CLINICAL DATA:  Post intubation. EXAM: PORTABLE CHEST 1 VIEW COMPARISON:  11/03/2015 FINDINGS: Patient is status post intubation. Endotracheal tube overlies the tracheal air column and terminates 15 mm above the carina. Retraction with approximately 1-2 cm may be considered. Enteric catheter has been placed, tip collimated off the image. Right IJ approach central venous catheter is stable. Cardiomediastinal silhouette is normal. Mediastinal contours appear intact. Calcific atherosclerotic disease of the aorta seen. There is no evidence of pneumothorax. Low lung volumes with interstitial pulmonary edema. Osseous structures are without acute abnormality. Soft tissues are grossly normal. IMPRESSION: Status post intubation. Slight retraction of the endotracheal tube may be considered. Interstitial pulmonary edema. Electronically Signed   By: Ted Mcalpine M.D.   On: 11/03/2015 19:17   Dg Ang/ext/uni/or Left  Result Date: 11/02/2015 CLINICAL DATA:  65 year old female undergoing distal bypass EXAM: LEFT ANG/EXT/UNI/ OR COMPARISON:  Prior intraoperative angiographic images 8 09/17/2015 FINDINGS: Single cross-table lateral intraoperative image demonstrates what appears to be a patent saphenous vein bypass graft attaching to the distal popliteal artery below the knee joint. The popliteal artery is diffusely diseased. Primary runoff to the ankle is via the peroneal artery. The anterior tibial artery is occluded proximally but reconstitutes distally. The posterior tibial artery is occluded. IMPRESSION:  Intraoperative arteriogram as above. Electronically Signed   By: Malachy Moan M.D.   On: 11/02/2015 14:26   Dg Abd Portable 1v  Result Date: 11/03/2015 CLINICAL DATA:  Orogastric tube placement. EXAM: PORTABLE ABDOMEN - 1 VIEW COMPARISON:  None. FINDINGS: The bowel gas pattern is normal. Enteric catheter is seen overlying the expected location of gastric body. Bilateral iliac vascular stents are seen. Soft tissue details are obscured by a motion artifact. IMPRESSION: Nonobstructive bowel gas pattern. Enteric catheter overlies the expected location of gastric body. Electronically Signed   By: Ted Mcalpine M.D.   On: 11/03/2015 19:20   Dg Foot Complete Left  Result Date: 10/30/2015 CLINICAL DATA:  Dorsal foot pain. Skin lesion with possible wound infection. EXAM: LEFT FOOT - COMPLETE 3+ VIEW COMPARISON:  09/19/2015 FINDINGS: There is definite destructive osteomyelitis of the fifth toe affecting the distal metatarsal an the proximal phalanx. There is probable osteomyelitis of the fourth toe affecting the distal metatarsal an the proximal phalanx. There is questionable osteomyelitis of the proximal phalanx of the third toe. IMPRESSION: Osteomyelitis of the forefoot, definite affecting the fifth toe, probable affecting the fourth toe and possible affecting the third toe. See above. Electronically Signed   By: Paulina Fusi M.D.   On: 10/30/2015 20:05   Ct Head Code Stroke Wo Contrast  Addendum Date: 11/03/2015   ADDENDUM REPORT: 11/03/2015 15:12 ADDENDUM: These results were called by telephone at the time of interpretation on 11/03/2015 at 3:05 pm to Dr. Desmond Lope, who verbally acknowledged these results. Electronically Signed   By: Sebastian Ache M.D.   On: 11/03/2015 15:12   Result Date: 11/03/2015 CLINICAL DATA:  Code stroke. Slurred speech with left-sided deficits. EXAM: CT HEAD WITHOUT CONTRAST TECHNIQUE: Contiguous axial images were obtained from the base of the skull through the vertex without  intravenous contrast. COMPARISON:  Brain MRI 03/03/2013 and CT 03/02/2013 FINDINGS: Brain: Mild motion artifact. There is no evidence of acute cortical infarct, intracranial hemorrhage, mass, midline shift, or extra-axial fluid collection. Ventricles and sulci are normal in size for age. Patchy cerebral white matter hypodensities are stable to slightly progressive and nonspecific but compatible with mild chronic small vessel ischemic disease. A small chronic left cerebellar infarct  is unchanged. Vascular: Calcified atherosclerosis at the skullbase. Skull: No fracture or focal osseous lesion. Sinuses/Orbits: Prior right cataract extraction.  Clear sinuses. Other: None. ASPECTS [facility] Stroke Program Early CT Score) - Ganglionic level infarction (caudate, lentiform nuclei, internal capsule, insula, M1-M3 cortex): 7 - Supraganglionic infarction (M4-M6 cortex): 3 Total score (0-10 with 10 being normal): 10 IMPRESSION: 1. No evidence of acute intracranial abnormality. 2. ASPECTS is 10. 3. Mild chronic small vessel ischemic disease. Chronic left cerebellar infarct. Electronically Signed: By: Sebastian Ache M.D. On: 11/03/2015 14:48    Time Spent in minutes  25   Eddie North M.D on 11/11/2015 at 3:34 PM  Between 7am to 7pm - Pager - [phone]  After 7pm go to [email or web address] - password [facility]  [facility] -  Office  [phone]

## 2015-11-11 NOTE — Progress Notes (Signed)
Patient ID: [name] [name] Age, female   DOB: 1950-07-12, 65 y.o.   MRN: [id number] Tolerated surgery well.  VAC on left foot that periodically infuses  Saline thru the wound.  Good seal with the VAC.

## 2015-11-11 NOTE — Progress Notes (Signed)
Physical Therapy Treatment Patient Details Name: JANEMARIE GILLENWATER MRN: [id number] DOB: [birth year] Today's Date: 11/11/2015    History of Present Illness Pt adm with lt plantar abscess and underwent revascularization on 10/5. On 10/6 acutely developed slurred speech, L sided weakness, and altered MS. CT negative but neurology consulted and felt it was right brain  Small subcortical infarct not seen on CT. Pt PMH - DM, PVD, PAD, MI, HTN, CAD, arthritis,    PT Comments    Pt underwent I&D with wound vac placement L foot 11-10-15. Pt limited by pain and fatigue today. Recommend attempting SPT without RW next session.  Follow Up Recommendations  SNF     Equipment Recommendations  None recommended by PT    Recommendations for Other Services       Precautions / Restrictions Precautions Precautions: Fall;Other (comment) Precaution Comments: watch HR  Restrictions Weight Bearing Restrictions: Yes LLE Weight Bearing: Non weight bearing    Mobility  Bed Mobility         Supine to sit: Min assist;HOB elevated     General bed mobility comments: +rail, assist with LLE, increased time to complete  Transfers   Equipment used: Rolling walker (2 wheeled)   Sit to Stand: +2 physical assistance;Mod assist Stand pivot transfers: +2 physical assistance;Max assist       General transfer comment: Pt flexed forward needing assist for upright posure. Heavy reliance on RW. Pt having difficulty maintaining NWB LLE and unable to hop to progress RLE. Max assist of 2 needed to complete SPT to recliner.   Ambulation/Gait             General Gait Details: unable   Stairs            Wheelchair Mobility    Modified Rankin (Stroke Patients Only) Modified Rankin (Stroke Patients Only) Pre-Morbid Rankin Score: Moderate disability Modified Rankin: Severe disability     Balance   Sitting-balance support: Feet supported;No upper extremity supported Sitting balance-Leahy Scale:  Good     Standing balance support: During functional activity;Bilateral upper extremity supported Standing balance-Leahy Scale: Zero                      Cognition Arousal/Alertness: Awake/alert Behavior During Therapy: WFL for tasks assessed/performed Overall Cognitive Status: Within Functional Limits for tasks assessed                      Exercises      General Comments        Pertinent Vitals/Pain Pain Score: 6  Pain Location: L foot Pain Descriptors / Indicators: Sore Pain Intervention(s): Monitored during session;Repositioned;Limited activity within patient's tolerance    Home Living                      Prior Function            PT Goals (current goals can now be found in the care plan section) Acute Rehab PT Goals Patient Stated Goal: Return home PT Goal Formulation: With patient Time For Goal Achievement: 11/20/15 Potential to Achieve Goals: Good Progress towards PT goals: Progressing toward goals    Frequency    Min 3X/week      PT Plan Current plan remains appropriate    Co-evaluation             End of Session Equipment Utilized During Treatment: Gait belt;Oxygen Activity Tolerance: Patient limited by fatigue;Patient limited by pain Patient left: in  chair;with call bell/phone within reach     Time: 0830-0856 PT Time Calculation (min) (ACUTE ONLY): 26 min  Charges:  $Therapeutic Activity: 23-37 mins                    G Codes:      Ilda FoilGarrow, Charles Niese Rene 11/11/2015, 10:22 AM

## 2015-11-12 ENCOUNTER — Inpatient Hospital Stay (HOSPITAL_COMMUNITY): Payer: Medicare Other

## 2015-11-12 LAB — GLUCOSE, CAPILLARY
GLUCOSE-CAPILLARY: 171 mg/dL — AB (ref 65–99)
GLUCOSE-CAPILLARY: 195 mg/dL — AB (ref 65–99)
Glucose-Capillary: 129 mg/dL — ABNORMAL HIGH (ref 65–99)

## 2015-11-12 LAB — BLOOD GAS, ARTERIAL
ACID-BASE DEFICIT: 2.5 mmol/L — AB (ref 0.0–2.0)
Bicarbonate: 23.1 mmol/L (ref 20.0–28.0)
DRAWN BY: 257701
Delivery systems: POSITIVE
Expiratory PAP: 5
FIO2: 100
Inspiratory PAP: 10
LHR: 12 {breaths}/min
MODE: POSITIVE
O2 Saturation: 99.6 %
PH ART: 7.287 — AB (ref 7.350–7.450)
Patient temperature: 98.6
pCO2 arterial: 49.9 mmHg — ABNORMAL HIGH (ref 32.0–48.0)
pO2, Arterial: 256 mmHg — ABNORMAL HIGH (ref 83.0–108.0)

## 2015-11-12 LAB — BASIC METABOLIC PANEL
ANION GAP: 5 (ref 5–15)
BUN: 15 mg/dL (ref 4–21)
BUN: 15 mg/dL (ref 6–20)
CHLORIDE: 106 mmol/L (ref 101–111)
CO2: 25 mmol/L (ref 22–32)
CREATININE: 0.5 mg/dL (ref 0.5–1.1)
Calcium: 8.5 mg/dL — ABNORMAL LOW (ref 8.9–10.3)
Creatinine, Ser: 0.54 mg/dL (ref 0.44–1.00)
GFR calc Af Amer: >60 mL/min (ref >60)
GFR calc non Af Amer: >60 mL/min (ref >60)
GLUCOSE: 157 mg/dL
GLUCOSE: 157 mg/dL — AB (ref 65–99)
POTASSIUM: 3.9 mmol/L (ref 3.4–5.3)
POTASSIUM: 3.9 mmol/L (ref 3.5–5.1)
Sodium: 136 mmol/L (ref 135–145)
Sodium: 136 mmol/L — AB (ref 137–147)

## 2015-11-12 LAB — IRON AND TIBC
IRON: 20 ug/dL — AB (ref 28–170)
Saturation Ratios: 11 % (ref 10.4–31.8)
TIBC: 182 ug/dL — ABNORMAL LOW (ref 250–450)
UIBC: 162 ug/dL

## 2015-11-12 LAB — CBC AND DIFFERENTIAL
HCT: 22 % — AB (ref 36–46)
HEMOGLOBIN: 7.1 g/dL — AB (ref 12.0–16.0)
PLATELETS: 395 10*3/uL (ref 150–399)
WBC: 10.3 10*3/mL

## 2015-11-12 LAB — CBC
HEMATOCRIT: 21.8 % — AB (ref 36.0–46.0)
HEMOGLOBIN: 7.1 g/dL — AB (ref 12.0–15.0)
MCH: 28.9 pg (ref 26.0–34.0)
MCHC: 32.6 g/dL (ref 30.0–36.0)
MCV: 88.6 fL (ref 78.0–100.0)
Platelets: 395 10*3/uL (ref 150–400)
RBC: 2.46 MIL/uL — ABNORMAL LOW (ref 3.87–5.11)
RDW: 15.8 % — ABNORMAL HIGH (ref 11.5–15.5)
WBC: 10.3 10*3/uL (ref 4.0–10.5)

## 2015-11-12 LAB — MRSA PCR SCREENING: MRSA by PCR: NEGATIVE

## 2015-11-12 LAB — TROPONIN I: TROPONIN I: 0.05 ng/mL — AB (ref <0.03)

## 2015-11-12 LAB — VANCOMYCIN, TROUGH: Vancomycin Tr: 20 ug/mL (ref 15–20)

## 2015-11-12 LAB — PREPARE RBC (CROSSMATCH)

## 2015-11-12 MED ORDER — DEXTROSE 5 % IV SOLN
2.0000 g | INTRAVENOUS | Status: DC
  Administered 2015-11-12 – 2015-11-14 (×3): 2 g via INTRAVENOUS
  Filled 2015-11-12 (×4): qty 2

## 2015-11-12 MED ORDER — SODIUM CHLORIDE 0.9 % IV SOLN
Freq: Once | INTRAVENOUS | Status: AC
  Administered 2015-11-12: 09:00:00 via INTRAVENOUS

## 2015-11-12 MED ORDER — HYDRALAZINE HCL 20 MG/ML IJ SOLN
INTRAMUSCULAR | Status: AC
  Administered 2015-11-12: 10 mg via INTRAVENOUS
  Filled 2015-11-12: qty 1

## 2015-11-12 MED ORDER — HYDRALAZINE HCL 20 MG/ML IJ SOLN
10.0000 mg | Freq: Once | INTRAMUSCULAR | Status: AC
  Administered 2015-11-12: 10 mg via INTRAVENOUS

## 2015-11-12 MED ORDER — FUROSEMIDE 10 MG/ML IJ SOLN
60.0000 mg | INTRAMUSCULAR | Status: AC
  Administered 2015-11-12: 60 mg via INTRAVENOUS

## 2015-11-12 NOTE — Progress Notes (Signed)
Pharmacy Antibiotic Note  [name] [name] is a 65 y.o. female admitted on 10/30/2015 with osteomyelitis.  Pharmacy has been consulted for vancomycin and cefepime dosing.  Vancomycin trough is therapeutic at 20 micrograms/mL on 1000mg  IV every 12 hours. Scr is stable at 0.54. Good UOP at 1.4 cc/kg/hr. Afebrile. WBC is trending down at 10.8 and lactic acid normalized.   Plan: This patient's current antibiotics will be continued without adjustments.   Vancomycin 1000 mg IV q12h - Goal trough 15-20 mcg/mL.  Cefepime 2g IV every 12 hours. Monitor renal function, clinical status, and levels as appropriate.   Height: 5\' 4"  (162.6 cm) Weight: 161 lb 13.1 oz (73.4 kg) IBW/kg (Calculated) : 54.7  Temp (24hrs), Avg:98.7 F (37.1 C), Min:98.4 F (36.9 C), Max:99.2 F (37.3 C)   Recent Labs Lab 11/06/15 0600 11/06/15 0615 11/06/15 1345 11/07/15 0447 11/08/15 0530 11/09/15 1300 11/12/15 0450 11/12/15 1035  WBC  --   --  9.6 8.7 11.8* 12.2* 10.3  --   CREATININE 0.43*  --   --  0.43* 0.41* 0.51 0.54  --   VANCOTROUGH  --  11*  --   --   --   --   --  20    Estimated Creatinine Clearance: 68.8 mL/min (by C-G formula based on SCr of 0.54 mg/dL).    Allergies  Allergen Reactions  . Hydrochlorothiazide Other (See Comments)    Unknown  . Latex Rash  . Penicillins Swelling and Rash    Pt states she has tolerated Keflex in the past without problems. States she may have tolerated Augmentin in the past but it caused GI upset.    Antimicrobials this admission: 10/3 vanc >> 10/4; 10/6>> 10/3 aztreonam >> 10/5 10/4 CTX x 1 dose 10/2 LVQ x 1 dose 10/6 cefepime >>  Dose adjustments this admission: 10/9 VT 11 on 750 mg IV every 12 hours.   Microbiology results: 10/3 MRSA PCR: negative 10/6 Hepatitis panel negative 10/6 Blood x 2 neg 10/8 Cdiff neg 10/6 Urine neg 10/13 surgical wound Cxr: rare streptococcus agalactiae seen, rare GPC in pairs seen, no anaerobes isolated  Thank you for  allowing pharmacy to be a part of this patient's care.   Allena Katz, Pharm.D. PGY1 Pharmacy Resident 10/15/201711:50 AM Pager [phone]

## 2015-11-12 NOTE — Progress Notes (Signed)
MD notified via text page about critical trop value. Will redraw @ 0000. Lavell Anchors, RN

## 2015-11-12 NOTE — Progress Notes (Signed)
  Cultures look like they are only isolating group b strep  - plan for 6 wk of abtx with ceftriaxone 2gm IV daily. - can place picc line for long term abtx

## 2015-11-12 NOTE — Progress Notes (Signed)
Patient went into acute respiratory distress shortly after PRBC transfusion was completed. Placed on NRB. sats improved but she was tachycardic and had accelerated HT with SBP >210. Lungs congested and trachyphonic on exam. Given lasix 60 mg IV x1, hydralazine 10 mg IV x1. CXR shows acute pulmonary edema. palced on BiPAP . Tachypnea and tachycardia improving. BP improved to 150s/90s. Check troponin and stat EKG. Transfer to stepdown unit for close monitoring  daughter at bedside.

## 2015-11-12 NOTE — Significant Event (Signed)
Rapid Response Event Note  Overview:  Called by RN for Respiratory distress. Time Called: 1704 Arrival Time: 1710 Event Type: Respiratory  Initial Focused Assessment:  Called by RN for respiratory distress.  On my arrival to patietns rooms RN and family at bedside.  Patietn is diaphoretic, SOB with increased WOB.  Patinet is sitting up in bed with NRB mask on.  203/87, 136, 97% on NRB, RR 26.  Breath Sounds crackles, wheeze throughout.    Interventions:  RT called for ABG and BIPAP.  MD paged and updated.  PCXR being obtained.   Dr. Gonzella Lex at bedside.  60 mg IV lasix given, 10 mg IV hyrdralazine given as per MD.  ABG results 7.28, Co249.9, O2 256, 23.1 HCO3 on BIPAP.   Plan of Care (if not transferred):  Patient to transfer to SDU via bed with bipap and monitor  Event Summary: RN to call if assistance needed   at      at          [facility], Maryagnes Amos

## 2015-11-12 NOTE — Progress Notes (Signed)
K. Score, NP notified of patient's NPO status on Bipap and she advised to hold all meds. Will continue to monitor. Lavell Anchors, RN

## 2015-11-12 NOTE — Progress Notes (Signed)
PROGRESS NOTE                                                                                                                                                                                                             Patient Demographics:    [name] [name], is a 65 y.o. female, DOB - 08-May-1950, ZOX:[id number]  Admit date - 10/30/2015   Admitting Physician Jonah Blue, MD  Outpatient Primary MD for the patient is Georgann Housekeeper, MD  LOS - 13  Outpatient Specialists: NONE  Chief Complaint  Patient presents with  . foot infection-[facility]       Brief Narrative   65 year old female with history of diabetes mellitus peripheral vascular disease status post revascularization, history of MI, hypertension, hyperlipidemia, peripheral neuropathy resented with left foot ostium myelitis admitted on 10/2 and underwent left fem-pop bypass on 11/02/15. She was evaluated by orthopedics who recommended surgical debridement. However on 10/60 tablet acute slurred speech with left-sided weakness and encephalopathy and then went into shock. She was also found to have drop in her blood 5.8 secondary to bleeding into her left thigh. She was intubated for airway protection and successfully extubated the next day. Hemoglobin improved with transfusion. She also developed NSTEMI. Patient was taken to OR for surgical debridement of the osteomyelitis / diabetic foot ulcer on 10/13.   Subjective Denies any pain in her left foot this morning. Noted for drop in H&H.   Assessment  & Plan :    Principal Problem:   Osteomyelitis of left foot (HCC) Secondary to uncontrolled diabetes with diabetic foot ulcer and peripheral vascular disease Empiric vancomycin and cefepime. Cultures growing few Strep Agalactiae. Sensitivity pending.  Pain control with when necessary IV fentanyl. -Left foot wound VAC, orthopedics closely  following. -Antibiotic course per ID. (Will need at least 6 weeks course) , based on on sensitivity. Needs PICC line.  Active Problems: Acute hypoxemic respiratory failure Secondary to hemorrhagic/septic shock,NSTEMI, infection and narcotics. Now resolved.  Septic shock Resolved. Continue empiric antibiotics.    Diabetes mellitus type II, uncontrolled (HCC) History of neuropathy and peripheral vascular disease. A1c of 7. Continue Lantus and sliding scale coverage..   Acute encephalopathy with possible right brainstem/subcortical infarct Head CT unremarkable. Appreciate neurology consult. No focal neurological deficit. Continue antiplatelet therapy.  NSTEMI chronic systolic CHF Continue Plavix and  aspirin. Continue statin and beta blocker. Echo shows normal EF with grade 1 diastolic dysfunction.  Hemorrhagic shock secondary to bleeding into left thigh Drop in H&H today, transfuse 1 U today.     PVD (peripheral vascular disease) with claudication (HCC) S/p fem-pop bypass.        Code Status : Full code  Family Communication  : None at bedside  Disposition Plan  : Possibly needs nursing facility  Barriers For Discharge : Active symptoms  Consults  :   Vascular surgery Orthopedics ID PCCM (signed off)  Procedures  :  Left Fem pop bypass Debridement of left foot ulcer and appendectomy occasional wound VAC Intubation 2-D echo  DVT Prophylaxis  :  SCDs  Lab Results  Component Value Date   PLT 395 11/12/2015    Antibiotics  :   Anti-infectives    Start     Dose/Rate Route Frequency Ordered Stop   11/11/15 1145  fluconazole (DIFLUCAN) tablet 100 mg     100 mg Oral Daily 11/11/15 1141     11/10/15 1420  clindamycin (CLEOCIN) 900 MG/50ML IVPB    Comments:  Rock, Jennifer   : cabinet override      11/10/15 1420 11/10/15 1452   11/10/15 0600  clindamycin (CLEOCIN) IVPB 900 mg     900 mg 100 mL/hr over 30 Minutes Intravenous To Short Stay 11/09/15 1723  11/10/15 1452   11/09/15 0746  clindamycin (CLEOCIN) IVPB 900 mg  Status:  Discontinued     900 mg 100 mL/hr over 30 Minutes Intravenous On call to O.R. 11/09/15 0747 11/09/15 0754   11/06/15 1900  vancomycin (VANCOCIN) IVPB 1000 mg/200 mL premix     1,000 mg 200 mL/hr over 60 Minutes Intravenous Every 12 hours 11/06/15 1044     11/03/15 2000  ceFEPIme (MAXIPIME) 2 g in dextrose 5 % 50 mL IVPB     2 g 100 mL/hr over 30 Minutes Intravenous Every 12 hours 11/03/15 1913     11/03/15 1900  vancomycin (VANCOCIN) IVPB 750 mg/150 ml premix  Status:  Discontinued     750 mg 150 mL/hr over 60 Minutes Intravenous Every 12 hours 11/03/15 1743 11/06/15 1044   11/03/15 1900  aztreonam (AZACTAM) 1 g in dextrose 5 % 50 mL IVPB  Status:  Discontinued     1 g 100 mL/hr over 30 Minutes Intravenous Every 8 hours 11/03/15 1743 11/03/15 1909   11/02/15 0600  vancomycin (VANCOCIN) IVPB 1000 mg/200 mL premix  Status:  Discontinued     1,000 mg 200 mL/hr over 60 Minutes Intravenous On call to O.R. 11/01/15 0854 11/01/15 0910   11/01/15 1300  cefTRIAXone (ROCEPHIN) 1 g in dextrose 5 % 50 mL IVPB     1 g 100 mL/hr over 30 Minutes Intravenous  Once 11/01/15 1215 11/01/15 1407   10/31/15 0600  vancomycin (VANCOCIN) IVPB 750 mg/150 ml premix  Status:  Discontinued     750 mg 150 mL/hr over 60 Minutes Intravenous Every 12 hours 10/31/15 0156 11/02/15 1828   10/31/15 0600  aztreonam (AZACTAM) 1 g in dextrose 5 % 50 mL IVPB  Status:  Discontinued     1 g 100 mL/hr over 30 Minutes Intravenous Every 8 hours 10/31/15 0210 11/01/15 1215   10/31/15 0045  aztreonam (AZACTAM) 2 g in dextrose 5 % 50 mL IVPB     2 g 100 mL/hr over 30 Minutes Intravenous  Once 10/31/15 0033 10/31/15 0131   10/30/15 2030  vancomycin (VANCOCIN)  1,500 mg in sodium chloride 0.9 % 500 mL IVPB     1,500 mg 250 mL/hr over 120 Minutes Intravenous  Once 10/30/15 2020 10/30/15 2335   10/30/15 2030  levofloxacin (LEVAQUIN) IVPB 750 mg     750 mg 100  mL/hr over 90 Minutes Intravenous  Once 10/30/15 2020 10/30/15 2231        Objective:   Vitals:   11/11/15 0605 11/11/15 1416 11/11/15 2124 11/12/15 0549  BP: (!) 128/58 (!) 132/49 (!) 128/57 (!) 121/46  Pulse: 84 84 84 80  Resp: (!) 1 20 18 17   Temp:  98.4 F (36.9 C) 98.6 F (37 C) 99.2 F (37.3 C)  TempSrc: Oral Oral Oral Oral  SpO2: 95% 99% 97% 94%  Weight: 72.5 kg (159 lb 13.3 oz)   73.4 kg (161 lb 13.1 oz)  Height:        Wt Readings from Last 3 Encounters:  11/12/15 73.4 kg (161 lb 13.1 oz)  09/29/15 77.2 kg (170 lb 3.1 oz)  09/20/15 74.4 kg (164 lb)     Intake/Output Summary (Last 24 hours) at 11/12/15 1205 Last data filed at 11/12/15 1154  Gross per 24 hour  Intake           1066.5 ml  Output             1650 ml  Net           -583.5 ml     Physical Exam  Gen: not in distress HEENT: pallor+, moist mucosa, supple neck Chest: clear b/l, no added sounds CVS: N S1&S2, no murmurs GI: soft, NT, ND, BS+ Musculoskeletal: warm, wound vac over left foot     Data Review:    CBC  Recent Labs Lab 11/06/15 1345 11/07/15 0447 11/08/15 0530 11/09/15 1300 11/12/15 0450  WBC 9.6 8.7 11.8* 12.2* 10.3  HGB 8.6* 8.0* 8.3* 8.7* 7.1*  HCT 26.3* 24.7* 25.0* 26.4* 21.8*  PLT 325 321 448* 414* 395  MCV 88.6 88.8 85.9 87.1 88.6  MCH 29.0 28.8 28.5 28.7 28.9  MCHC 32.7 32.4 33.2 33.0 32.6  RDW 15.2 15.2 15.5 15.3 15.8*  LYMPHSABS  --   --  1.8  --   --   MONOABS  --   --  1.2*  --   --   EOSABS  --   --  0.3  --   --   BASOSABS  --   --  0.0  --   --     Chemistries   Recent Labs Lab 11/06/15 0600 11/07/15 0447 11/08/15 0530 11/09/15 1300 11/12/15 0450  NA  --  140 140 138 136  K  --  3.4* 3.1* 3.9 3.9  CL  --  106 105 105 106  CO2  --  27 27 26 25   GLUCOSE  --  64* 51* 167* 157*  BUN  --  <5* 5* 9 15  CREATININE 0.43* 0.43* 0.41* 0.51 0.54  CALCIUM  --  8.2* 8.5* 8.5* 8.5*  AST  --  22  --   --   --   ALT  --  15  --   --   --   ALKPHOS  --   85  --   --   --   BILITOT  --  0.6  --   --   --    ------------------------------------------------------------------------------------------------------------------ No results for input(s): CHOL, HDL, LDLCALC, TRIG, CHOLHDL, LDLDIRECT in the last 72 hours.  Lab Results  Component Value Date   HGBA1C 7.6 (H) 11/02/2015   ------------------------------------------------------------------------------------------------------------------ No results for input(s): TSH, T4TOTAL, T3FREE, THYROIDAB in the last 72 hours.  Invalid input(s): FREET3 ------------------------------------------------------------------------------------------------------------------ No results for input(s): VITAMINB12, FOLATE, FERRITIN, TIBC, IRON, RETICCTPCT in the last 72 hours.  Coagulation profile No results for input(s): INR, PROTIME in the last 168 hours.  No results for input(s): DDIMER in the last 72 hours.  Cardiac Enzymes No results for input(s): CKMB, TROPONINI, MYOGLOBIN in the last 168 hours.  Invalid input(s): CK ------------------------------------------------------------------------------------------------------------------ No results found for: BNP  Inpatient Medications  Scheduled Meds: . sodium chloride   Intravenous Once  . aspirin EC  81 mg Oral Daily  . atorvastatin  80 mg Oral Daily  . ceFEPime (MAXIPIME) IV  2 g Intravenous Q12H  . clopidogrel  75 mg Oral Daily  . diphenhydrAMINE  50 mg Oral Once  . feeding supplement (PRO-STAT SUGAR FREE 64)  30 mL Oral BID  . fluconazole  100 mg Oral Daily  . gabapentin  300 mg Oral TID  . insulin aspart  0-15 Units Subcutaneous TID WC  . insulin glargine  35 Units Subcutaneous Q2200  . metoprolol tartrate  50 mg Oral BID  . pantoprazole  40 mg Oral Q1200  . polyethylene glycol  17 g Oral Daily  . senna-docusate  1 tablet Oral QHS  . vancomycin  1,000 mg Intravenous Q12H   Continuous Infusions: . sodium chloride 10 mL/hr at 11/12/15 0552    PRN Meds:.acetaminophen **OR** acetaminophen, alum & mag hydroxide-simeth, bisacodyl, fentaNYL (SUBLIMAZE) injection, guaiFENesin-dextromethorphan, magnesium hydroxide, methocarbamol **OR** methocarbamol (ROBAXIN)  IV, metoCLOPramide **OR** metoCLOPramide (REGLAN) injection, ondansetron, ondansetron **OR** ondansetron (ZOFRAN) IV, oxyCODONE, phenol, sodium chloride flush  Micro Results Recent Results (from the past 240 hour(s))  Culture, blood (routine x 2)     Status: None   Collection Time: 11/03/15  4:14 PM  Result Value Ref Range Status   Specimen Description BLOOD LEFT HAND  Final   Special Requests IN PEDIATRIC BOTTLE 2CC  Final   Culture NO GROWTH 5 DAYS  Final   Report Status 11/08/2015 FINAL  Final  Culture, blood (routine x 2)     Status: None   Collection Time: 11/03/15  4:17 PM  Result Value Ref Range Status   Specimen Description BLOOD LEFT WRIST  Final   Special Requests BOTTLES DRAWN AEROBIC ONLY 5CC  Final   Culture NO GROWTH 5 DAYS  Final   Report Status 11/08/2015 FINAL  Final  Urine culture     Status: None   Collection Time: 11/03/15  4:17 PM  Result Value Ref Range Status   Specimen Description URINE, RANDOM  Final   Special Requests NONE  Final   Culture NO GROWTH  Final   Report Status 11/05/2015 FINAL  Final  Urine culture     Status: None   Collection Time: 11/03/15  7:04 PM  Result Value Ref Range Status   Specimen Description URINE, CATHETERIZED  Final   Special Requests NONE  Final   Culture NO GROWTH  Final   Report Status 11/04/2015 FINAL  Final  C difficile quick scan w PCR reflex     Status: None   Collection Time: 11/05/15  4:21 PM  Result Value Ref Range Status   C Diff antigen NEGATIVE NEGATIVE Final   C Diff toxin NEGATIVE NEGATIVE Final   C Diff interpretation No C. difficile detected.  Final  Aerobic/Anaerobic Culture (surgical/deep wound)     Status: None (  Preliminary result)   Collection Time: 11/10/15  3:07 PM  Result Value Ref  Range Status   Specimen Description TISSUE  Final   Special Requests   Final    LEFT FOOT ULCER TISSUE ON SWAB PATIENT ON FOLLOWING VANC AND MAXIPIME   Gram Stain   Final    ABUNDANT WBC PRESENT, PREDOMINANTLY PMN NO ORGANISMS SEEN    Culture FEW GROUP B STREP(S.AGALACTIAE)ISOLATED  Final   Report Status PENDING  Incomplete  Aerobic/Anaerobic Culture (surgical/deep wound)     Status: None (Preliminary result)   Collection Time: 11/10/15  3:07 PM  Result Value Ref Range Status   Specimen Description TISSUE  Final   Special Requests   Final    LEFT FOOT ULCER PATIENT ON FOLLOWING VANC AND MAXIPIME   Gram Stain   Final    ABUNDANT WBC PRESENT, PREDOMINANTLY PMN RARE GRAM POSITIVE COCCI IN PAIRS    Culture   Final    RARE STREPTOCOCCUS AGALACTIAE NO ANAEROBES ISOLATED; CULTURE IN PROGRESS FOR 5 DAYS    Report Status PENDING  Incomplete    Radiology Reports Ct Angio Head W Or Wo Contrast  Result Date: 11/03/2015 CLINICAL DATA:  Left-sided weakness and aphasia. EXAM: CT ANGIOGRAPHY HEAD AND NECK CT CEREBRAL PERFUSION WITH CONTRAST TECHNIQUE: Multidetector CT imaging of the head and neck was performed using the standard protocol during bolus administration of intravenous contrast. Multiplanar CT image reconstructions and MIPs were obtained to evaluate the vascular anatomy. Carotid stenosis measurements (when applicable) are obtained utilizing NASCET criteria, using the distal internal carotid diameter as the denominator. CT cerebral perfusion imaging was also performed during the dynamic injection of intravenous contrast. Post processing was performed. CONTRAST:  90 mL Isovue 370 COMPARISON:  Head MRI/ MRA 09/20/2013 FINDINGS: CTA NECK FINDINGS Aortic arch: Three vessel aortic arch with extensive atherosclerotic calcified plaque. There is approximately 60% stenosis of the proximal brachiocephalic artery and proximal right subclavian artery. Atherosclerotic plaque in a focal when or intimal  flap in the proximal left subclavian artery contribute to 65% stenosis. Right carotid system: Common carotid artery is patent without stenosis. Internal carotid artery is occluded at its origin without reconstitution in the neck. There is mild narrowing of the ECA origin. Left carotid system: Moderate atherosclerotic plaque at the carotid bifurcation without significant common or internal carotid artery stenosis. Mild proximal ECA stenosis. Vertebral arteries: Vertebral arteries are patent and codominant. There is mild right V2 narrowing at the C2 and C5 levels due to calcified plaque. There is mild left V2 narrowing due to degenerative vertebral spurring at C5-6 and C6-7. Skeleton: Moderate to advanced disc degeneration from C4-5 to C6-7. Other neck: 8 mm calculus in the distal left submandibular duct without significant ductal dilatation or inflammatory change. Small left thyroid nodule. Upper chest: Interlobular septal thickening in the lung apices with partially visualized ground-glass opacity posteriorly in the left upper lobe and superior segment segments of the left greater than right upper lobes. Review of the MIP images confirms the above findings CTA HEAD FINDINGS Anterior circulation: Chronically occluded right ICA with reconstitution of the terminus via a small but patent right A1 segment, unchanged. Right M1 segment is widely patent. There is at most mild narrowing of the proximal right M2 inferior division. Decreased number of distal right MCA branch vessels in the right parietal region is chronic. Intracranial left ICA is patent with extensive atherosclerosis though only resulting in at most mild stenosis. 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 Left A1 segment is widely patent, as is the anterior communicating artery. A2 segments are patent. Left MCA is patent without evidence of  major branch occlusion or significant proximal stenosis. Posterior circulation: Intracranial vertebral arteries are patent to the basilar with prominent atherosclerosis, greater on the right. There is severe proximal right V4 stenosis with additional moderate tandem stenoses. PICA origins appear patent, potentially with a high-grade stenosis on the right. AICA and SCA origins are patent. Basilar artery is patent without stenosis. Posterior communicating arteries are not identified. PCAs are patent with mild distal left P1 narrowing. No aneurysm. Venous sinuses: Patent. Anatomic variants: Small right A1. Review of the MIP images confirms the above findings CT CEREBRAL PERFUSION FINDINGS There is mildly prolonged transit time throughout most of the right MCA territory, with slightly greater delayed transit (6 seconds greater than the contralateral side) in the deep watershed territory/centrum semiovale. Cerebral blood flow is preserved. Cerebral blood volume is also normal to mildly increased. No diminished CBF/CBV suggestive of acute infarction is seen. IMPRESSION: 1. 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 2. Prolonged transit time throughout the right MCA territory due to #1 with compensated cerebral blood flow parameters. No evidence of acute infarct. 3. Chronically decreased number of distal right MCA branch vessels. No evidence of acute major branch occlusion. 4. Severe right V4 vertebral artery stenosis. 5. 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 6. Aortic atherosclerosis. 7. 8 mm left submandibular duct calculus. 8. Interlobular septal thickening and ground-glass opacities in lung apices, possibly reflecting edema. Correlate with chest radiography. These results were called by telephone at the time of interpretation on 11/03/2015 at 4:00 pm to Dr. Desmond Lope, who verbally acknowledged these results. Electronically Signed   By: Sebastian Ache M.D.    On: 11/03/2015 16:31   Ct Angio Neck W Or Wo Contrast  Result Date: 11/03/2015 CLINICAL DATA:  Left-sided weakness and aphasia. EXAM: CT ANGIOGRAPHY HEAD AND NECK CT CEREBRAL PERFUSION WITH CONTRAST TECHNIQUE: Multidetector CT imaging of the head and neck was performed using the standard protocol during bolus administration of intravenous contrast. Multiplanar CT image reconstructions and MIPs were obtained to evaluate the vascular anatomy. Carotid stenosis measurements (when applicable) are obtained utilizing NASCET criteria, using the distal internal carotid diameter as the denominator. CT cerebral perfusion imaging was also performed during the dynamic injection of intravenous contrast. Post processing was performed. CONTRAST:  90 mL Isovue 370 COMPARISON:  Head MRI/ MRA 09/20/2013 FINDINGS: CTA NECK FINDINGS Aortic arch: Three vessel aortic arch with extensive atherosclerotic calcified plaque. There is approximately 60% stenosis of the proximal brachiocephalic artery and proximal right subclavian artery. Atherosclerotic plaque in a focal when or intimal flap in the proximal left subclavian artery contribute to 65% stenosis. Right carotid system: Common carotid artery is patent without stenosis. Internal carotid artery is occluded at its origin without reconstitution in the neck. There is mild narrowing of the ECA origin. Left carotid system: Moderate atherosclerotic plaque at the carotid bifurcation without significant common or internal carotid artery stenosis. Mild proximal ECA stenosis. Vertebral arteries: Vertebral arteries are patent and codominant. There is mild right V2 narrowing at the C2 and C5 levels due to calcified plaque. There is mild left V2 narrowing due to degenerative vertebral spurring at C5-6 and C6-7. Skeleton: Moderate to advanced disc degeneration from C4-5 to C6-7. Other neck: 8 mm calculus in the distal left submandibular duct without  significant ductal dilatation or inflammatory  change. Small left thyroid nodule. Upper chest: Interlobular septal thickening in the lung apices with partially visualized ground-glass opacity posteriorly in the left upper lobe and superior segment segments of the left greater than right upper lobes. Review of the MIP images confirms the above findings CTA HEAD FINDINGS Anterior circulation: Chronically occluded right ICA with reconstitution of the terminus via a small but patent right A1 segment, unchanged. Right M1 segment is widely patent. There is at most mild narrowing of the proximal right M2 inferior division. Decreased number of distal right MCA branch vessels in the right parietal region is chronic. Intracranial left ICA is patent with extensive atherosclerosis though only resulting in at most mild stenosis. 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 Left A1 segment is widely patent, as is the anterior communicating artery. A2 segments are patent. Left MCA is patent without evidence of major branch occlusion or significant proximal stenosis. Posterior circulation: Intracranial vertebral arteries are patent to the basilar with prominent atherosclerosis, greater on the right. There is severe proximal right V4 stenosis with additional moderate tandem stenoses. PICA origins appear patent, potentially with a high-grade stenosis on the right. AICA and SCA origins are patent. Basilar artery is patent without stenosis. Posterior communicating arteries are not identified. PCAs are patent with mild distal left P1 narrowing. No aneurysm. Venous sinuses: Patent. Anatomic variants: Small right A1. Review of the MIP images confirms the above findings CT CEREBRAL PERFUSION FINDINGS There is mildly prolonged transit time throughout most of the right MCA territory, with slightly greater delayed transit (6 seconds greater than the contralateral side) in the  deep watershed territory/centrum semiovale. Cerebral blood flow is preserved. Cerebral blood volume is also normal to mildly increased. No diminished CBF/CBV suggestive of acute infarction is seen. IMPRESSION: 1. 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 2. Prolonged transit time throughout the right MCA territory due to #1 with compensated cerebral blood flow parameters. No evidence of acute infarct. 3. Chronically decreased number of distal right MCA branch vessels. No evidence of acute major branch occlusion. 4. Severe right V4 vertebral artery stenosis. 5. 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 6. Aortic atherosclerosis. 7. 8 mm left submandibular duct calculus. 8. Interlobular septal thickening and ground-glass opacities in lung apices, possibly reflecting edema. Correlate with chest radiography. These results were called by telephone at the time of interpretation on 11/03/2015 at 4:00 pm to Dr. Desmond Lope, who verbally acknowledged these results. Electronically Signed   By: Sebastian Ache M.D.   On: 11/03/2015 16:31   Mr Foot Left W Wo Contrast  Result Date: 11/02/2015 CLINICAL DATA:  Diabetic foot ulcer, assessment for osteomyelitis. EXAM: MRI OF THE LEFT FOREFOOT WITHOUT AND WITH CONTRAST TECHNIQUE: Multiplanar, multisequence MR imaging was performed both before and after administration of intravenous contrast. CONTRAST:  15mL MULTIHANCE GADOBENATE DIMEGLUMINE 529 MG/ML IV SOLN COMPARISON:  10/30/2015 FINDINGS: Osteomyelitis protocol MRI of the foot was obtained, to include the entire foot and ankle. This protocol uses a large field of view to cover the entire foot and ankle, and is suitable for assessing bony structures for osteomyelitis. Due to the large field of view and imaging plane choice, this protocol is less sensitive for assessing small structures such as ligamentous structures of the foot and ankle, compared to a dedicated forefoot  or dedicated hindfoot exam. Despite efforts by the technologist  and patient, motion artifact is present on today's exam and could not be eliminated. This reduces exam sensitivity and specificity. Abnormal edema and enhancement involving the distal head and metaphysis of the fifth metatarsal and adjacent proximal half of the small toe proximal phalanx compatible with osteomyelitis. Lateral ulceration along the fifth MTP joint probably communicates with the joint on image 33/6. Extensive subcutaneous edema along the plantar foot at the level of the midfoot and forefoot, although I do not see a drainable abscess this may well be spontaneously draining to the plantar foot given the overlying skin irregularity. There is edema and likely some enhancement along the plantar foot musculature. Thickened proximal plantar fascia. Dorsal subcutaneous edema in the foot. IMPRESSION: 1. Ulceration lateral to the fifth MTP joint appears to extend all the way down into the joint. There is osteomyelitis distally in the fifth metatarsal and also in the proximal phalanx of the fifth toe. No other osteomyelitis in the foot is identified. 2. Suspected ulcerations along the plantar foot with confluent phlegmon tracking in the plantar foot, primarily within the subcutaneous tissues but also along the deep margin of the medial band of the plantar fascia. I suspected any early abscess in this region is spontaneously draining. 3. Cellulitis along the dorsum of the foot. Electronically Signed   By: Gaylyn Rong M.D.   On: 11/02/2015 07:31   Ct Cerebral Perfusion W Contrast  Result Date: 11/03/2015 CLINICAL DATA:  Left-sided weakness and aphasia. EXAM: CT ANGIOGRAPHY HEAD AND NECK CT CEREBRAL PERFUSION WITH CONTRAST TECHNIQUE: Multidetector CT imaging of the head and neck was performed using the standard protocol during bolus administration of intravenous contrast. Multiplanar CT image reconstructions and MIPs were obtained to evaluate  the vascular anatomy. Carotid stenosis measurements (when applicable) are obtained utilizing NASCET criteria, using the distal internal carotid diameter as the denominator. CT cerebral perfusion imaging was also performed during the dynamic injection of intravenous contrast. Post processing was performed. CONTRAST:  90 mL Isovue 370 COMPARISON:  Head MRI/ MRA 09/20/2013 FINDINGS: CTA NECK FINDINGS Aortic arch: Three vessel aortic arch with extensive atherosclerotic calcified plaque. There is approximately 60% stenosis of the proximal brachiocephalic artery and proximal right subclavian artery. Atherosclerotic plaque in a focal when or intimal flap in the proximal left subclavian artery contribute to 65% stenosis. Right carotid system: Common carotid artery is patent without stenosis. Internal carotid artery is occluded at its origin without reconstitution in the neck. There is mild narrowing of the ECA origin. Left carotid system: Moderate atherosclerotic plaque at the carotid bifurcation without significant common or internal carotid artery stenosis. Mild proximal ECA stenosis. Vertebral arteries: Vertebral arteries are patent and codominant. There is mild right V2 narrowing at the C2 and C5 levels due to calcified plaque. There is mild left V2 narrowing due to degenerative vertebral spurring at C5-6 and C6-7. Skeleton: Moderate to advanced disc degeneration from C4-5 to C6-7. Other neck: 8 mm calculus in the distal left submandibular duct without significant ductal dilatation or inflammatory change. Small left thyroid nodule. Upper chest: Interlobular septal thickening in the lung apices with partially visualized ground-glass opacity posteriorly in the left upper lobe and superior segment segments of the left greater than right upper lobes. Review of the MIP images confirms the above findings CTA HEAD FINDINGS Anterior circulation: Chronically occluded right ICA with reconstitution of the terminus via a small but  patent right A1 segment, unchanged. Right M1 segment is widely patent. There is at most mild narrowing of the proximal  right M2 inferior division. Decreased number of distal right MCA branch vessels in the right parietal region is chronic. Intracranial left ICA is patent with extensive atherosclerosis though only resulting in at most mild stenosis. 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 Left A1 segment is widely patent, as is the anterior communicating artery. A2 segments are patent. Left MCA is patent without evidence of major branch occlusion or significant proximal stenosis. Posterior circulation: Intracranial vertebral arteries are patent to the basilar with prominent atherosclerosis, greater on the right. There is severe proximal right V4 stenosis with additional moderate tandem stenoses. PICA origins appear patent, potentially with a high-grade stenosis on the right. AICA and SCA origins are patent. Basilar artery is patent without stenosis. Posterior communicating arteries are not identified. PCAs are patent with mild distal left P1 narrowing. No aneurysm. Venous sinuses: Patent. Anatomic variants: Small right A1. Review of the MIP images confirms the above findings CT CEREBRAL PERFUSION FINDINGS There is mildly prolonged transit time throughout most of the right MCA territory, with slightly greater delayed transit (6 seconds greater than the contralateral side) in the deep watershed territory/centrum semiovale. Cerebral blood flow is preserved. Cerebral blood volume is also normal to mildly increased. No diminished CBF/CBV suggestive of acute infarction is seen. IMPRESSION: 1. 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 2. Prolonged transit time throughout the right MCA territory due to #1 with compensated cerebral blood flow parameters. No  evidence of acute infarct. 3. Chronically decreased number of distal right MCA branch vessels. No evidence of acute major branch occlusion. 4. Severe right V4 vertebral artery stenosis. 5. 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 6. Aortic atherosclerosis. 7. 8 mm left submandibular duct calculus. 8. Interlobular septal thickening and ground-glass opacities in lung apices, possibly reflecting edema. Correlate with chest radiography. These results were called by telephone at the time of interpretation on 11/03/2015 at 4:00 pm to Dr. Desmond Lope, who verbally acknowledged these results. Electronically Signed   By: Sebastian Ache M.D.   On: 11/03/2015 16:31   Dg Chest Port 1 View  Result Date: 11/05/2015 CLINICAL DATA:  Hypoxia EXAM: PORTABLE CHEST 1 VIEW COMPARISON:  November 03, 2015 FINDINGS: Endotracheal tube and nasogastric tube have been removed. Central catheter tip is in the superior vena cava. No pneumothorax. There is a focal area of consolidation in the right upper lobe medially. There is right base atelectasis. Lungs elsewhere clear. Heart is upper normal in size with pulmonary vascularity within normal limits. There is atherosclerotic calcification in the aorta. No adenopathy. IMPRESSION: Small area of airspace consolidation in the right upper lobe medially, likely infiltrate. Mild right base atelectasis. Lungs elsewhere clear. Stable cardiac silhouette. Aortic atherosclerosis. No pneumothorax. Electronically Signed   By: Bretta Bang III M.D.   On: 11/05/2015 10:56   Dg Chest Port 1 View  Result Date: 11/03/2015 CLINICAL DATA:  Right central venous line placement EXAM: PORTABLE CHEST 1 VIEW COMPARISON:  08/18/2015 chest radiograph. FINDINGS: Right internal jugular central venous catheter terminates in the upper third of the superior vena cava. Stable cardiomediastinal silhouette with normal heart size with aortic atherosclerosis. No pneumothorax. No pleural effusion. Mild diffuse  prominence of the central interstitial markings. No acute consolidative airspace disease. IMPRESSION: 1. Right internal jugular central venous catheter terminates in the upper third of the superior vena cava. No pneumothorax. 2. Top-normal heart size. Prominence of the central  interstitial markings, favor vascular crowding due to low lung volumes . 3. Aortic atherosclerosis. Electronically Signed   By: Delbert Phenix M.D.   On: 11/03/2015 17:31   Dg Chest Port 1v Same Day  Result Date: 11/03/2015 CLINICAL DATA:  Post intubation. EXAM: PORTABLE CHEST 1 VIEW COMPARISON:  11/03/2015 FINDINGS: Patient is status post intubation. Endotracheal tube overlies the tracheal air column and terminates 15 mm above the carina. Retraction with approximately 1-2 cm may be considered. Enteric catheter has been placed, tip collimated off the image. Right IJ approach central venous catheter is stable. Cardiomediastinal silhouette is normal. Mediastinal contours appear intact. Calcific atherosclerotic disease of the aorta seen. There is no evidence of pneumothorax. Low lung volumes with interstitial pulmonary edema. Osseous structures are without acute abnormality. Soft tissues are grossly normal. IMPRESSION: Status post intubation. Slight retraction of the endotracheal tube may be considered. Interstitial pulmonary edema. Electronically Signed   By: Ted Mcalpine M.D.   On: 11/03/2015 19:17   Dg Ang/ext/uni/or Left  Result Date: 11/02/2015 CLINICAL DATA:  65 year old female undergoing distal bypass EXAM: LEFT ANG/EXT/UNI/ OR COMPARISON:  Prior intraoperative angiographic images 8 09/17/2015 FINDINGS: Single cross-table lateral intraoperative image demonstrates what appears to be a patent saphenous vein bypass graft attaching to the distal popliteal artery below the knee joint. The popliteal artery is diffusely diseased. Primary runoff to the ankle is via the peroneal artery. The anterior tibial artery is occluded proximally  but reconstitutes distally. The posterior tibial artery is occluded. IMPRESSION: Intraoperative arteriogram as above. Electronically Signed   By: Malachy Moan M.D.   On: 11/02/2015 14:26   Dg Abd Portable 1v  Result Date: 11/03/2015 CLINICAL DATA:  Orogastric tube placement. EXAM: PORTABLE ABDOMEN - 1 VIEW COMPARISON:  None. FINDINGS: The bowel gas pattern is normal. Enteric catheter is seen overlying the expected location of gastric body. Bilateral iliac vascular stents are seen. Soft tissue details are obscured by a motion artifact. IMPRESSION: Nonobstructive bowel gas pattern. Enteric catheter overlies the expected location of gastric body. Electronically Signed   By: Ted Mcalpine M.D.   On: 11/03/2015 19:20   Dg Foot Complete Left  Result Date: 10/30/2015 CLINICAL DATA:  Dorsal foot pain. Skin lesion with possible wound infection. EXAM: LEFT FOOT - COMPLETE 3+ VIEW COMPARISON:  09/19/2015 FINDINGS: There is definite destructive osteomyelitis of the fifth toe affecting the distal metatarsal an the proximal phalanx. There is probable osteomyelitis of the fourth toe affecting the distal metatarsal an the proximal phalanx. There is questionable osteomyelitis of the proximal phalanx of the third toe. IMPRESSION: Osteomyelitis of the forefoot, definite affecting the fifth toe, probable affecting the fourth toe and possible affecting the third toe. See above. Electronically Signed   By: Paulina Fusi M.D.   On: 10/30/2015 20:05   Ct Head Code Stroke Wo Contrast  Addendum Date: 11/03/2015   ADDENDUM REPORT: 11/03/2015 15:12 ADDENDUM: These results were called by telephone at the time of interpretation on 11/03/2015 at 3:05 pm to Dr. Desmond Lope, who verbally acknowledged these results. Electronically Signed   By: Sebastian Ache M.D.   On: 11/03/2015 15:12   Result Date: 11/03/2015 CLINICAL DATA:  Code stroke. Slurred speech with left-sided deficits. EXAM: CT HEAD WITHOUT CONTRAST TECHNIQUE: Contiguous  axial images were obtained from the base of the skull through the vertex without intravenous contrast. COMPARISON:  Brain MRI 03/03/2013 and CT 03/02/2013 FINDINGS: Brain: Mild motion artifact. There is no evidence of acute cortical infarct, intracranial hemorrhage, mass, midline shift, or  extra-axial fluid collection. Ventricles and sulci are normal in size for age. Patchy cerebral white matter hypodensities are stable to slightly progressive and nonspecific but compatible with mild chronic small vessel ischemic disease. A small chronic left cerebellar infarct is unchanged. Vascular: Calcified atherosclerosis at the skullbase. Skull: No fracture or focal osseous lesion. Sinuses/Orbits: Prior right cataract extraction.  Clear sinuses. Other: None. ASPECTS [facility] Stroke Program Early CT Score) - Ganglionic level infarction (caudate, lentiform nuclei, internal capsule, insula, M1-M3 cortex): 7 - Supraganglionic infarction (M4-M6 cortex): 3 Total score (0-10 with 10 being normal): 10 IMPRESSION: 1. No evidence of acute intracranial abnormality. 2. ASPECTS is 10. 3. Mild chronic small vessel ischemic disease. Chronic left cerebellar infarct. Electronically Signed: By: Sebastian Ache M.D. On: 11/03/2015 14:48    Time Spent in minutes  25   Eddie North M.D on 11/12/2015 at 12:05 PM  Between 7am to 7pm - Pager - [phone]  After 7pm go to [email or web address] - password [email or web address]  [facility] -  Office  [phone]

## 2015-11-13 DIAGNOSIS — E876 Hypokalemia: Secondary | ICD-10-CM

## 2015-11-13 DIAGNOSIS — J810 Acute pulmonary edema: Secondary | ICD-10-CM

## 2015-11-13 LAB — BASIC METABOLIC PANEL
Anion gap: 7 (ref 5–15)
BUN: 11 mg/dL (ref 6–20)
BUN: 11 mg/dL (ref 4–21)
CHLORIDE: 107 mmol/L (ref 101–111)
CO2: 25 mmol/L (ref 22–32)
Calcium: 8.8 mg/dL — ABNORMAL LOW (ref 8.9–10.3)
Creatinine, Ser: 0.56 mg/dL (ref 0.44–1.00)
Creatinine: 0.6 mg/dL (ref 0.5–1.1)
GFR calc Af Amer: >60 mL/min (ref >60)
GFR calc non Af Amer: >60 mL/min (ref >60)
GLUCOSE: 111 mg/dL — AB (ref 65–99)
GLUCOSE: 111 mg/dL
POTASSIUM: 3.4 mmol/L — AB (ref 3.5–5.1)
SODIUM: 139 mmol/L (ref 135–145)
SODIUM: 139 mmol/L (ref 137–147)

## 2015-11-13 LAB — GLUCOSE, CAPILLARY
GLUCOSE-CAPILLARY: 104 mg/dL — AB (ref 65–99)
GLUCOSE-CAPILLARY: 158 mg/dL — AB (ref 65–99)
GLUCOSE-CAPILLARY: 125 mg/dL — AB (ref 65–99)
Glucose-Capillary: 183 mg/dL — ABNORMAL HIGH (ref 65–99)

## 2015-11-13 LAB — TYPE AND SCREEN
ABO/RH(D): A POS
ANTIBODY SCREEN: NEGATIVE
Unit division: 0

## 2015-11-13 LAB — CBC
HCT: 27.6 % — ABNORMAL LOW (ref 36.0–46.0)
HEMOGLOBIN: 9.1 g/dL — AB (ref 12.0–15.0)
MCH: 28.5 pg (ref 26.0–34.0)
MCHC: 33 g/dL (ref 30.0–36.0)
MCV: 86.5 fL (ref 78.0–100.0)
Platelets: 422 10*3/uL — ABNORMAL HIGH (ref 150–400)
RBC: 3.19 MIL/uL — ABNORMAL LOW (ref 3.87–5.11)
RDW: 15.5 % (ref 11.5–15.5)
WBC: 10.7 10*3/uL — ABNORMAL HIGH (ref 4.0–10.5)

## 2015-11-13 LAB — TROPONIN I
Troponin I: 0.05 ng/mL (ref <0.03)
Troponin I: 0.05 ng/mL (ref <0.03)

## 2015-11-13 LAB — CBC AND DIFFERENTIAL: WBC: 10.7 10^3/mL

## 2015-11-13 MED ORDER — POTASSIUM CHLORIDE CRYS ER 20 MEQ PO TBCR
40.0000 meq | EXTENDED_RELEASE_TABLET | Freq: Once | ORAL | Status: AC
  Administered 2015-11-13: 40 meq via ORAL
  Filled 2015-11-13: qty 2

## 2015-11-13 MED ORDER — FUROSEMIDE 10 MG/ML IJ SOLN
40.0000 mg | Freq: Once | INTRAMUSCULAR | Status: AC
  Administered 2015-11-13: 40 mg via INTRAVENOUS
  Filled 2015-11-13: qty 4

## 2015-11-13 NOTE — Consult Note (Deleted)
WOC Nurse wound consult note Reason for Consult: Change VAC to regular dressing without installation of fluids. Wound type: Surgical area on plantar surface of left foot  Measurement:10 cm x 5.5 cm x 1.8 cm. Wound bed: 100% beefy red, with bright red bleeding upon removal of existing dressing. Drainage :  No odor detected.  Bloody drainage. Periwound: Intact. Dressing procedure/placement/frequency:  Small VAC dressing MWF.  Patient will likely need medicating prior to dressing change.  Patient received 50 mcg of Fentanyl IV by primary RN at initiation of dressing change today and was able to tolerate the procedure well.  There is an additional area on the left lateral malleolus that measures 2.5 cm x 2.3 cm without depth.  The wound bed is really dark pink/red.  A small piece of xeroform and allevyn dressing was placed to this site.  Discussed POC with patient and bedside nurse.   Thanks Helmut Muster MSN, RN, CNS-BC, [employer]

## 2015-11-13 NOTE — Progress Notes (Signed)
Patient ID: [name] [name] Age, female   DOB: 1950/10/28, 65 y.o.   MRN: [id number] Post op day 3 infusional VAC left foot, will have VAC changed to regular vac, may d/c to home on regular vac, change m,w,f

## 2015-11-13 NOTE — Consult Note (Signed)
WOC Nurse wound consult note Reason for Consult: Change VAC to regular dressing without installation of fluids. Wound type: Surgical area on plantar surface of left foot  Measurement:10 cm x 5.5 cm x 1.8 cm. Wound bed: 100% beefy red, with bright red bleeding upon removal of existing dressing. Drainage :  No odor detected.  Bloody drainage. Periwound: Intact. Dressing procedure/placement/frequency:  Small VAC dressing MWF.  Patient will likely need medicating prior to dressing change.  Patient received 50 mcg of Fentanyl IV by primary RN at initiation of dressing change today and was able to tolerate the procedure well.  There is an additional area on the left lateral 5th metatarsal head that measures 2.5 cm x 2.3 cm without depth.  The wound bed is really dark pink/red.  A small piece of xeroform and allevyn dressing was placed to this site.  Discussed POC with patient and bedside nurse.   Thanks Helmut Muster MSN, RN, CNS-BC, [employer]

## 2015-11-13 NOTE — Progress Notes (Signed)
Physical Therapy Treatment Patient Details Name: [name] [name] MRN: [id number] DOB: 08/22/1950 Today's Date: 11/13/2015    History of Present Illness Pt adm with lt plantar abscess and underwent revascularization on 10/5. On 10/6 acutely developed slurred speech, L sided weakness, and altered MS. CT negative but neurology consulted and felt it was right brain  Small subcortical infarct not seen on CT. Pt PMH - DM, PVD, PAD, MI, HTN, CAD, arthritis.  On 11/12/15 had acute respiratory distress after gettin blood and was transferred down to stepdown unit and placed on BiPap.      PT Comments    Pt was better able to help both get EOB and transfer to chair with two person assist today.  She did much better keeping her left foot NWB during transfer and was instructed to preform ankle pumps and quad sets on her left leg to help maintain her ROM while she is healing.   Follow Up Recommendations  SNF     Equipment Recommendations  Wheelchair cushion (measurements PT);Wheelchair (measurements PT)    Recommendations for Other Services   NA     Precautions / Restrictions Precautions Precautions: Fall;Other (comment) Precaution Comments: wound vac Restrictions LLE Weight Bearing: Non weight bearing    Mobility  Bed Mobility Overal bed mobility: Needs Assistance Bed Mobility: Supine to Sit     Supine to sit: Min assist;HOB elevated     General bed mobility comments: Min assist to help support trunk during transition to sitting EOB.  Pt managing both legs.   Transfers Overall transfer level: Needs assistance Equipment used: 2 person hand held assist Transfers: Sit to/from [employer] CorporationStand;Stand Pivot Transfers Sit to Stand: +2 physical assistance;Mod assist Stand pivot transfers: +2 physical assistance;Mod assist       General transfer comment: Two person mod assist to support trunk while turning to chair, much easier without RW as a barrier between pt and therapist.  I think we should  continue to try to stand EOB with RW.  Pt did well maintaining NWB in her left foot during transfer.          Balance Overall balance assessment: Needs assistance Sitting-balance support: Feet supported;Bilateral upper extremity supported Sitting balance-Leahy Scale: Good     Standing balance support: Bilateral upper extremity supported Standing balance-Leahy Scale: Poor                      Cognition Arousal/Alertness: Awake/alert Behavior During Therapy: WFL for tasks assessed/performed Overall Cognitive Status: Within Functional Limits for tasks assessed                      Exercises General Exercises - Lower Extremity Ankle Circles/Pumps: AROM;AAROM;Left;10 reps Quad Sets: AROM;Left;10 reps        Pertinent Vitals/Pain Pain Assessment: 0-10 Pain Score: 5  Pain Location: left foot Pain Descriptors / Indicators: Aching;Burning Pain Intervention(s): Limited activity within patient's tolerance;Monitored during session;Repositioned           PT Goals (current goals can now be found in the care plan section) Acute Rehab PT Goals Patient Stated Goal: Return home    Frequency    Min 3X/week      PT Plan Current plan remains appropriate       End of Session Equipment Utilized During Treatment: Gait belt Activity Tolerance: Patient limited by pain;Patient limited by fatigue Patient left: in chair;with call bell/phone within reach     Time: [id number] PT Time Calculation (min) (ACUTE ONLY):  25 min  Charges:  $Therapeutic Activity: 23-37 mins                      Traven Davids B. Saaya Procell, PT, DPT [phone]   11/13/2015, 4:22 PM

## 2015-11-13 NOTE — Care Management Important Message (Signed)
Important Message  Patient Details  Name: [name] [name] MRN: [id number] Date of Birth: [birth year]   Medicare Important Message Given:  Yes    Darril Patriarca Abena 11/13/2015, 1:45 PM

## 2015-11-13 NOTE — Progress Notes (Signed)
Primary RN made aware that PICC would not be inserted today (10/16).

## 2015-11-13 NOTE — Progress Notes (Addendum)
PROGRESS NOTE                                                                                                                                                                                                             Patient Demographics:    [name] [name], is a 65 y.o. female, DOB - 1950-10-14, NFA:[id number]  Admit date - 10/30/2015   Admitting Physician Jonah Blue, MD  Outpatient Primary MD for the patient is Georgann Housekeeper, MD  LOS - 14  Outpatient Specialists: NONE  Chief Complaint  Patient presents with  . foot infection-[facility]       Brief Narrative   65 year old female with history of diabetes mellitus peripheral vascular disease status post revascularization, history of MI, hypertension, hyperlipidemia, peripheral neuropathy resented with left foot ostium myelitis admitted on 10/2 and underwent left fem-pop bypass on 11/02/15. She was evaluated by orthopedics who recommended surgical debridement. However on 10/60 tablet acute slurred speech with left-sided weakness and encephalopathy and then went into shock. She was also found to have drop in her blood 5.8 secondary to bleeding into her left thigh. She was intubated for airway protection and successfully extubated the next day. Hemoglobin improved with transfusion. She also developed NSTEMI. Patient was taken to OR for surgical debridement of the osteomyelitis / diabetic foot ulcer on 10/13.  Patient transfers 1 unit PRBC on 10/15 and shortly went into acute respiratory distress with pulmonary edema. Given IV Lasix and placed on BiPAP and transferred to stepdown unit.  Subjective Transferred to stepdown unit for acute pulmonary edema posttransfusion. Required BiPAP overnight and now stable on nasal cannula.   Assessment  & Plan :    Principal Problem:   Osteomyelitis of left foot (HCC) Secondary to uncontrolled diabetes with diabetic foot  ulcer and peripheral vascular disease Empiric vancomycin and cefepime. Cultures growing few Strep Agalactiae. Sensitivity pending.  Pain control with when necessary IV fentanyl. -plan to switch to regular wound VAC today. -I did recommend 6 weeks of IV Rocephin. Order PICC line.  Active Problems: Acute hypoxemic respiratory failure Secondary to hemorrhagic/septic shock,NSTEMI, infection and narcotics. Went into acute pulmonary edema on 10/16. Improved with BiPAP and diuretics.  Septic shock Resolved. Continue empiric antibiotics.    Diabetes mellitus type II, uncontrolled (HCC) History of neuropathy and peripheral vascular disease. A1c of 7.  Continue Lantus and sliding scale coverage..   Acute encephalopathy with possible right brainstem/subcortical infarct Head CT unremarkable. Appreciate neurology consult. No focal neurological deficit. Continue antiplatelet therapy.  NSTEMI chronic systolic CHF Continue Plavix and aspirin. Continue statin and beta blocker. Echo shows normal EF with grade 1 diastolic dysfunction.  Hemorrhagic shock secondary to bleeding into left thigh H&H improved with transfusion.     PVD (peripheral vascular disease) with claudication (HCC) S/p fem-pop bypass. Will follow-up with Dr. Edilia Bo as outpatient.   Hypokalemia Replenished     Code Status : Full code  Family Communication  : Spoke with daughter on 10/15.  Disposition Plan  : SNF on 10/17  Barriers For Discharge : Improving symptoms. Needs telemetry monitoring for another day, PICC line placement.  Consults  :   Vascular surgery Orthopedics ID PCCM (signed off)  Procedures  :  Left Fem pop bypass Debridement of left foot ulcer wound VAC placement Intubation 2-D echo  DVT Prophylaxis  :  SCDs  Lab Results  Component Value Date   PLT 422 (H) 11/13/2015    Antibiotics  :   Anti-infectives    Start     Dose/Rate Route Frequency Ordered Stop   11/12/15 1230  cefTRIAXone  (ROCEPHIN) 2 g in dextrose 5 % 50 mL IVPB     2 g 100 mL/hr over 30 Minutes Intravenous Every 24 hours 11/12/15 1219     11/11/15 1145  fluconazole (DIFLUCAN) tablet 100 mg     100 mg Oral Daily 11/11/15 1141     11/10/15 1420  clindamycin (CLEOCIN) 900 MG/50ML IVPB    Comments:  Rock, Jennifer   : cabinet override      11/10/15 1420 11/10/15 1452   11/10/15 0600  clindamycin (CLEOCIN) IVPB 900 mg     900 mg 100 mL/hr over 30 Minutes Intravenous To Short Stay 11/09/15 1723 11/10/15 1452   11/09/15 0746  clindamycin (CLEOCIN) IVPB 900 mg  Status:  Discontinued     900 mg 100 mL/hr over 30 Minutes Intravenous On call to O.R. 11/09/15 0747 11/09/15 0754   11/06/15 1900  vancomycin (VANCOCIN) IVPB 1000 mg/200 mL premix  Status:  Discontinued     1,000 mg 200 mL/hr over 60 Minutes Intravenous Every 12 hours 11/06/15 1044 11/12/15 1219   11/03/15 2000  ceFEPIme (MAXIPIME) 2 g in dextrose 5 % 50 mL IVPB  Status:  Discontinued     2 g 100 mL/hr over 30 Minutes Intravenous Every 12 hours 11/03/15 1913 11/12/15 1219   11/03/15 1900  vancomycin (VANCOCIN) IVPB 750 mg/150 ml premix  Status:  Discontinued     750 mg 150 mL/hr over 60 Minutes Intravenous Every 12 hours 11/03/15 1743 11/06/15 1044   11/03/15 1900  aztreonam (AZACTAM) 1 g in dextrose 5 % 50 mL IVPB  Status:  Discontinued     1 g 100 mL/hr over 30 Minutes Intravenous Every 8 hours 11/03/15 1743 11/03/15 1909   11/02/15 0600  vancomycin (VANCOCIN) IVPB 1000 mg/200 mL premix  Status:  Discontinued     1,000 mg 200 mL/hr over 60 Minutes Intravenous On call to O.R. 11/01/15 0854 11/01/15 0910   11/01/15 1300  cefTRIAXone (ROCEPHIN) 1 g in dextrose 5 % 50 mL IVPB     1 g 100 mL/hr over 30 Minutes Intravenous  Once 11/01/15 1215 11/01/15 1407   10/31/15 0600  vancomycin (VANCOCIN) IVPB 750 mg/150 ml premix  Status:  Discontinued     750 mg 150  mL/hr over 60 Minutes Intravenous Every 12 hours 10/31/15 0156 11/02/15 1828   10/31/15 0600   aztreonam (AZACTAM) 1 g in dextrose 5 % 50 mL IVPB  Status:  Discontinued     1 g 100 mL/hr over 30 Minutes Intravenous Every 8 hours 10/31/15 0210 11/01/15 1215   10/31/15 0045  aztreonam (AZACTAM) 2 g in dextrose 5 % 50 mL IVPB     2 g 100 mL/hr over 30 Minutes Intravenous  Once 10/31/15 0033 10/31/15 0131   10/30/15 2030  vancomycin (VANCOCIN) 1,500 mg in sodium chloride 0.9 % 500 mL IVPB     1,500 mg 250 mL/hr over 120 Minutes Intravenous  Once 10/30/15 2020 10/30/15 2335   10/30/15 2030  levofloxacin (LEVAQUIN) IVPB 750 mg     750 mg 100 mL/hr over 90 Minutes Intravenous  Once 10/30/15 2020 10/30/15 2231        Objective:   Vitals:   11/12/15 2300 11/13/15 0335 11/13/15 0500 11/13/15 0722  BP: (!) 124/51 (!) 144/63  (!) 150/49  Pulse: 93 (!) 109  (!) 102  Resp: (!) 25 (!) 27  19  Temp: 99 F (37.2 C) 99 F (37.2 C)  98.1 F (36.7 C)  TempSrc: Axillary Axillary  Oral  SpO2: 100% 100%  95%  Weight:   80.3 kg (177 lb 0.5 oz)   Height:        Wt Readings from Last 3 Encounters:  11/13/15 80.3 kg (177 lb 0.5 oz)  09/29/15 77.2 kg (170 lb 3.1 oz)  09/20/15 74.4 kg (164 lb)     Intake/Output Summary (Last 24 hours) at 11/13/15 1105 Last data filed at 11/13/15 0600  Gross per 24 hour  Intake          1044.17 ml  Output             1370 ml  Net          -325.83 ml     Physical Exam  Gen: not in distress HEENT:  moist mucosa, supple neck Chest: Improved breath sounds bilaterally CVS: N S1&S2, no murmurs GI: soft, NT, ND, BS+ Musculoskeletal: warm, wound vac over left foot     Data Review:    CBC  Recent Labs Lab 11/07/15 0447 11/08/15 0530 11/09/15 1300 11/12/15 0450 11/13/15 0927  WBC 8.7 11.8* 12.2* 10.3 10.7*  HGB 8.0* 8.3* 8.7* 7.1* 9.1*  HCT 24.7* 25.0* 26.4* 21.8* 27.6*  PLT 321 448* 414* 395 422*  MCV 88.8 85.9 87.1 88.6 86.5  MCH 28.8 28.5 28.7 28.9 28.5  MCHC 32.4 33.2 33.0 32.6 33.0  RDW 15.2 15.5 15.3 15.8* 15.5  LYMPHSABS  --  1.8   --   --   --   MONOABS  --  1.2*  --   --   --   EOSABS  --  0.3  --   --   --   BASOSABS  --  0.0  --   --   --     Chemistries   Recent Labs Lab 11/07/15 0447 11/08/15 0530 11/09/15 1300 11/12/15 0450 11/13/15 0927  NA 140 140 138 136 139  K 3.4* 3.1* 3.9 3.9 3.4*  CL 106 105 105 106 107  CO2 27 27 26 25 25   GLUCOSE 64* 51* 167* 157* 111*  BUN <5* 5* 9 15 11   CREATININE 0.43* 0.41* 0.51 0.54 0.56  CALCIUM 8.2* 8.5* 8.5* 8.5* 8.8*  AST 22  --   --   --   --  ALT 15  --   --   --   --   ALKPHOS 85  --   --   --   --   BILITOT 0.6  --   --   --   --    ------------------------------------------------------------------------------------------------------------------ No results for input(s): CHOL, HDL, LDLCALC, TRIG, CHOLHDL, LDLDIRECT in the last 72 hours.  Lab Results  Component Value Date   HGBA1C 7.6 (H) 11/02/2015   ------------------------------------------------------------------------------------------------------------------ No results for input(s): TSH, T4TOTAL, T3FREE, THYROIDAB in the last 72 hours.  Invalid input(s): FREET3 ------------------------------------------------------------------------------------------------------------------  Recent Labs  11/12/15 0830  TIBC 182*  IRON 20*    Coagulation profile No results for input(s): INR, PROTIME in the last 168 hours.  No results for input(s): DDIMER in the last 72 hours.  Cardiac Enzymes  Recent Labs Lab 11/12/15 1845 11/13/15 0533  TROPONINI 0.05* 0.05*   ------------------------------------------------------------------------------------------------------------------ No results found for: BNP  Inpatient Medications  Scheduled Meds: . aspirin EC  81 mg Oral Daily  . atorvastatin  80 mg Oral Daily  . cefTRIAXone (ROCEPHIN)  IV  2 g Intravenous Q24H  . clopidogrel  75 mg Oral Daily  . diphenhydrAMINE  50 mg Oral Once  . feeding supplement (PRO-STAT SUGAR FREE 64)  30 mL Oral BID  .  fluconazole  100 mg Oral Daily  . furosemide  40 mg Intravenous Once  . gabapentin  300 mg Oral TID  . insulin aspart  0-15 Units Subcutaneous TID WC  . insulin glargine  35 Units Subcutaneous Q2200  . metoprolol tartrate  50 mg Oral BID  . pantoprazole  40 mg Oral Q1200  . polyethylene glycol  17 g Oral Daily  . senna-docusate  1 tablet Oral QHS   Continuous Infusions: . sodium chloride 10 mL/hr at 11/12/15 0552   PRN Meds:.acetaminophen **OR** acetaminophen, alum & mag hydroxide-simeth, bisacodyl, fentaNYL (SUBLIMAZE) injection, guaiFENesin-dextromethorphan, magnesium hydroxide, methocarbamol **OR** methocarbamol (ROBAXIN)  IV, metoCLOPramide **OR** metoCLOPramide (REGLAN) injection, ondansetron, ondansetron **OR** ondansetron (ZOFRAN) IV, oxyCODONE, phenol, sodium chloride flush  Micro Results Recent Results (from the past 240 hour(s))  Culture, blood (routine x 2)     Status: None   Collection Time: 11/03/15  4:14 PM  Result Value Ref Range Status   Specimen Description BLOOD LEFT HAND  Final   Special Requests IN PEDIATRIC BOTTLE 2CC  Final   Culture NO GROWTH 5 DAYS  Final   Report Status 11/08/2015 FINAL  Final  Culture, blood (routine x 2)     Status: None   Collection Time: 11/03/15  4:17 PM  Result Value Ref Range Status   Specimen Description BLOOD LEFT WRIST  Final   Special Requests BOTTLES DRAWN AEROBIC ONLY 5CC  Final   Culture NO GROWTH 5 DAYS  Final   Report Status 11/08/2015 FINAL  Final  Urine culture     Status: None   Collection Time: 11/03/15  4:17 PM  Result Value Ref Range Status   Specimen Description URINE, RANDOM  Final   Special Requests NONE  Final   Culture NO GROWTH  Final   Report Status 11/05/2015 FINAL  Final  Urine culture     Status: None   Collection Time: 11/03/15  7:04 PM  Result Value Ref Range Status   Specimen Description URINE, CATHETERIZED  Final   Special Requests NONE  Final   Culture NO GROWTH  Final   Report Status 11/04/2015  FINAL  Final  C difficile quick scan w PCR reflex  Status: None   Collection Time: 11/05/15  4:21 PM  Result Value Ref Range Status   C Diff antigen NEGATIVE NEGATIVE Final   C Diff toxin NEGATIVE NEGATIVE Final   C Diff interpretation No C. difficile detected.  Final  Aerobic/Anaerobic Culture (surgical/deep wound)     Status: None (Preliminary result)   Collection Time: 11/10/15  3:07 PM  Result Value Ref Range Status   Specimen Description TISSUE  Final   Special Requests   Final    LEFT FOOT ULCER TISSUE ON SWAB PATIENT ON FOLLOWING VANC AND MAXIPIME   Gram Stain   Final    ABUNDANT WBC PRESENT, PREDOMINANTLY PMN NO ORGANISMS SEEN    Culture   Final    FEW GROUP B STREP(S.AGALACTIAE)ISOLATED TESTING AGAINST S. AGALACTIAE NOT ROUTINELY PERFORMED DUE TO PREDICTABILITY OF AMP/PEN/VAN SUSCEPTIBILITY. NO ANAEROBES ISOLATED; CULTURE IN PROGRESS FOR 5 DAYS    Report Status PENDING  Incomplete  Aerobic/Anaerobic Culture (surgical/deep wound)     Status: None (Preliminary result)   Collection Time: 11/10/15  3:07 PM  Result Value Ref Range Status   Specimen Description TISSUE  Final   Special Requests   Final    LEFT FOOT ULCER PATIENT ON FOLLOWING VANC AND MAXIPIME   Gram Stain   Final    ABUNDANT WBC PRESENT, PREDOMINANTLY PMN RARE GRAM POSITIVE COCCI IN PAIRS    Culture   Final    RARE GROUP B STREP(S.AGALACTIAE)ISOLATED TESTING AGAINST S. AGALACTIAE NOT ROUTINELY PERFORMED DUE TO PREDICTABILITY OF AMP/PEN/VAN SUSCEPTIBILITY. NO ANAEROBES ISOLATED; CULTURE IN PROGRESS FOR 5 DAYS    Report Status PENDING  Incomplete  MRSA PCR Screening     Status: None   Collection Time: 11/12/15  7:05 PM  Result Value Ref Range Status   MRSA by PCR NEGATIVE NEGATIVE Final    Comment:        The GeneXpert MRSA Assay (FDA approved for NASAL specimens only), is one component of a comprehensive MRSA colonization surveillance program. It is not intended to diagnose MRSA infection nor to  guide or monitor treatment for MRSA infections.     Radiology Reports Ct Angio Head W Or Wo Contrast  Result Date: 11/03/2015 CLINICAL DATA:  Left-sided weakness and aphasia. EXAM: CT ANGIOGRAPHY HEAD AND NECK CT CEREBRAL PERFUSION WITH CONTRAST TECHNIQUE: Multidetector CT imaging of the head and neck was performed using the standard protocol during bolus administration of intravenous contrast. Multiplanar CT image reconstructions and MIPs were obtained to evaluate the vascular anatomy. Carotid stenosis measurements (when applicable) are obtained utilizing NASCET criteria, using the distal internal carotid diameter as the denominator. CT cerebral perfusion imaging was also performed during the dynamic injection of intravenous contrast. Post processing was performed. CONTRAST:  90 mL Isovue 370 COMPARISON:  Head MRI/ MRA 09/20/2013 FINDINGS: CTA NECK FINDINGS Aortic arch: Three vessel aortic arch with extensive atherosclerotic calcified plaque. There is approximately 60% stenosis of the proximal brachiocephalic artery and proximal right subclavian artery. Atherosclerotic plaque in a focal when or intimal flap in the proximal left subclavian artery contribute to 65% stenosis. Right carotid system: Common carotid artery is patent without stenosis. Internal carotid artery is occluded at its origin without reconstitution in the neck. There is mild narrowing of the ECA origin. Left carotid system: Moderate atherosclerotic plaque at the carotid bifurcation without significant common or internal carotid artery stenosis. Mild proximal ECA stenosis. Vertebral arteries: Vertebral arteries are patent and codominant. There is mild right V2 narrowing at the C2 and C5 levels  due to calcified plaque. There is mild left V2 narrowing due to degenerative vertebral spurring at C5-6 and C6-7. Skeleton: Moderate to advanced disc degeneration from C4-5 to C6-7. Other neck: 8 mm calculus in the distal left submandibular duct  without significant ductal dilatation or inflammatory change. Small left thyroid nodule. Upper chest: Interlobular septal thickening in the lung apices with partially visualized ground-glass opacity posteriorly in the left upper lobe and superior segment segments of the left greater than right upper lobes. Review of the MIP images confirms the above findings CTA HEAD FINDINGS Anterior circulation: Chronically occluded right ICA with reconstitution of the terminus via a small but patent right A1 segment, unchanged. Right M1 segment is widely patent. There is at most mild narrowing of the proximal right M2 inferior division. Decreased number of distal right MCA branch vessels in the right parietal region is chronic. Intracranial left ICA is patent with extensive atherosclerosis though only resulting in at most mild stenosis. 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 Left A1 segment is widely patent, as is the anterior communicating artery. A2 segments are patent. Left MCA is patent without evidence of major branch occlusion or significant proximal stenosis. Posterior circulation: Intracranial vertebral arteries are patent to the basilar with prominent atherosclerosis, greater on the right. There is severe proximal right V4 stenosis with additional moderate tandem stenoses. PICA origins appear patent, potentially with a high-grade stenosis on the right. AICA and SCA origins are patent. Basilar artery is patent without stenosis. Posterior communicating arteries are not identified. PCAs are patent with mild distal left P1 narrowing. No aneurysm. Venous sinuses: Patent. Anatomic variants: Small right A1. Review of the MIP images confirms the above findings CT CEREBRAL PERFUSION FINDINGS There is mildly prolonged transit time throughout most of the right MCA territory, with slightly greater delayed transit  (6 seconds greater than the contralateral side) in the deep watershed territory/centrum semiovale. Cerebral blood flow is preserved. Cerebral blood volume is also normal to mildly increased. No diminished CBF/CBV suggestive of acute infarction is seen. IMPRESSION: 1. 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 2. Prolonged transit time throughout the right MCA territory due to #1 with compensated cerebral blood flow parameters. No evidence of acute infarct. 3. Chronically decreased number of distal right MCA branch vessels. No evidence of acute major branch occlusion. 4. Severe right V4 vertebral artery stenosis. 5. 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 6. Aortic atherosclerosis. 7. 8 mm left submandibular duct calculus. 8. Interlobular septal thickening and ground-glass opacities in lung apices, possibly reflecting edema. Correlate with chest radiography. These results were called by telephone at the time of interpretation on 11/03/2015 at 4:00 pm to Dr. Desmond Lope, who verbally acknowledged these results. Electronically Signed   By: Sebastian Ache M.D.   On: 11/03/2015 16:31   Ct Angio Neck W Or Wo Contrast  Result Date: 11/03/2015 CLINICAL DATA:  Left-sided weakness and aphasia. EXAM: CT ANGIOGRAPHY HEAD AND NECK CT CEREBRAL PERFUSION WITH CONTRAST TECHNIQUE: Multidetector CT imaging of the head and neck was performed using the standard protocol during bolus administration of intravenous contrast. Multiplanar CT image reconstructions and MIPs were obtained to evaluate the vascular anatomy. Carotid stenosis measurements (when applicable) are obtained utilizing NASCET criteria, using the distal internal carotid diameter as the denominator. CT cerebral perfusion imaging was also performed during the dynamic injection of intravenous contrast. Post processing was performed.  CONTRAST:  90 mL Isovue 370 COMPARISON:  Head MRI/ MRA 09/20/2013 FINDINGS: CTA  NECK FINDINGS Aortic arch: Three vessel aortic arch with extensive atherosclerotic calcified plaque. There is approximately 60% stenosis of the proximal brachiocephalic artery and proximal right subclavian artery. Atherosclerotic plaque in a focal when or intimal flap in the proximal left subclavian artery contribute to 65% stenosis. Right carotid system: Common carotid artery is patent without stenosis. Internal carotid artery is occluded at its origin without reconstitution in the neck. There is mild narrowing of the ECA origin. Left carotid system: Moderate atherosclerotic plaque at the carotid bifurcation without significant common or internal carotid artery stenosis. Mild proximal ECA stenosis. Vertebral arteries: Vertebral arteries are patent and codominant. There is mild right V2 narrowing at the C2 and C5 levels due to calcified plaque. There is mild left V2 narrowing due to degenerative vertebral spurring at C5-6 and C6-7. Skeleton: Moderate to advanced disc degeneration from C4-5 to C6-7. Other neck: 8 mm calculus in the distal left submandibular duct without significant ductal dilatation or inflammatory change. Small left thyroid nodule. Upper chest: Interlobular septal thickening in the lung apices with partially visualized ground-glass opacity posteriorly in the left upper lobe and superior segment segments of the left greater than right upper lobes. Review of the MIP images confirms the above findings CTA HEAD FINDINGS Anterior circulation: Chronically occluded right ICA with reconstitution of the terminus via a small but patent right A1 segment, unchanged. Right M1 segment is widely patent. There is at most mild narrowing of the proximal right M2 inferior division. Decreased number of distal right MCA branch vessels in the right parietal region is chronic. Intracranial left ICA is patent with extensive atherosclerosis though only resulting in at most mild stenosis. The 2.5 mm proximal left cavernous  carotid aneurysm described on prior MRA is not discretely seen and may reflect atherosclerotic luminal irregularity instead. Left A1 segment is widely patent, as is the anterior communicating artery. A2 segments are patent. Left MCA is patent without evidence of major branch occlusion or significant proximal stenosis. Posterior circulation: Intracranial vertebral arteries are patent to the basilar with prominent atherosclerosis, greater on the right. There is severe proximal right V4 stenosis with additional moderate tandem stenoses. PICA origins appear patent, potentially with a high-grade stenosis on the right. AICA and SCA origins are patent. Basilar artery is patent without stenosis. Posterior communicating arteries are not identified. PCAs are patent with mild distal left P1 narrowing. No aneurysm. Venous sinuses: Patent. Anatomic variants: Small right A1. Review of the MIP images confirms the above findings CT CEREBRAL PERFUSION FINDINGS There is mildly prolonged transit time throughout most of the right MCA territory, with slightly greater delayed transit (6 seconds greater than the contralateral side) in the deep watershed territory/centrum semiovale. Cerebral blood flow is preserved. Cerebral blood volume is also normal to mildly increased. No diminished CBF/CBV suggestive of acute infarction is seen. IMPRESSION: 1. 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 2. Prolonged transit time throughout the right MCA territory due to #1 with compensated cerebral blood flow parameters. No evidence of acute infarct. 3. Chronically decreased number of distal right MCA branch vessels. No evidence of acute major branch occlusion. 4. Severe right V4 vertebral artery stenosis. 5. 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 6. Aortic atherosclerosis. 7. 8 mm left submandibular duct calculus. 8. Interlobular septal thickening and ground-glass opacities in lung  apices, possibly reflecting edema. Correlate with chest radiography. These results were called by  telephone at the time of interpretation on 11/03/2015 at 4:00 pm to Dr. Desmond Lope, who verbally acknowledged these results. Electronically Signed   By: Sebastian Ache M.D.   On: 11/03/2015 16:31   Mr Foot Left W Wo Contrast  Result Date: 11/02/2015 CLINICAL DATA:  Diabetic foot ulcer, assessment for osteomyelitis. EXAM: MRI OF THE LEFT FOREFOOT WITHOUT AND WITH CONTRAST TECHNIQUE: Multiplanar, multisequence MR imaging was performed both before and after administration of intravenous contrast. CONTRAST:  15mL MULTIHANCE GADOBENATE DIMEGLUMINE 529 MG/ML IV SOLN COMPARISON:  10/30/2015 FINDINGS: Osteomyelitis protocol MRI of the foot was obtained, to include the entire foot and ankle. This protocol uses a large field of view to cover the entire foot and ankle, and is suitable for assessing bony structures for osteomyelitis. Due to the large field of view and imaging plane choice, this protocol is less sensitive for assessing small structures such as ligamentous structures of the foot and ankle, compared to a dedicated forefoot or dedicated hindfoot exam. Despite efforts by the technologist and patient, motion artifact is present on today's exam and could not be eliminated. This reduces exam sensitivity and specificity. Abnormal edema and enhancement involving the distal head and metaphysis of the fifth metatarsal and adjacent proximal half of the small toe proximal phalanx compatible with osteomyelitis. Lateral ulceration along the fifth MTP joint probably communicates with the joint on image 33/6. Extensive subcutaneous edema along the plantar foot at the level of the midfoot and forefoot, although I do not see a drainable abscess this may well be spontaneously draining to the plantar foot given the overlying skin irregularity. There is edema and likely some enhancement along the plantar foot musculature. Thickened proximal  plantar fascia. Dorsal subcutaneous edema in the foot. IMPRESSION: 1. Ulceration lateral to the fifth MTP joint appears to extend all the way down into the joint. There is osteomyelitis distally in the fifth metatarsal and also in the proximal phalanx of the fifth toe. No other osteomyelitis in the foot is identified. 2. Suspected ulcerations along the plantar foot with confluent phlegmon tracking in the plantar foot, primarily within the subcutaneous tissues but also along the deep margin of the medial band of the plantar fascia. I suspected any early abscess in this region is spontaneously draining. 3. Cellulitis along the dorsum of the foot. Electronically Signed   By: Gaylyn Rong M.D.   On: 11/02/2015 07:31   Ct Cerebral Perfusion W Contrast  Result Date: 11/03/2015 CLINICAL DATA:  Left-sided weakness and aphasia. EXAM: CT ANGIOGRAPHY HEAD AND NECK CT CEREBRAL PERFUSION WITH CONTRAST TECHNIQUE: Multidetector CT imaging of the head and neck was performed using the standard protocol during bolus administration of intravenous contrast. Multiplanar CT image reconstructions and MIPs were obtained to evaluate the vascular anatomy. Carotid stenosis measurements (when applicable) are obtained utilizing NASCET criteria, using the distal internal carotid diameter as the denominator. CT cerebral perfusion imaging was also performed during the dynamic injection of intravenous contrast. Post processing was performed. CONTRAST:  90 mL Isovue 370 COMPARISON:  Head MRI/ MRA 09/20/2013 FINDINGS: CTA NECK FINDINGS Aortic arch: Three vessel aortic arch with extensive atherosclerotic calcified plaque. There is approximately 60% stenosis of the proximal brachiocephalic artery and proximal right subclavian artery. Atherosclerotic plaque in a focal when or intimal flap in the proximal left subclavian artery contribute to 65% stenosis. Right carotid system: Common carotid artery is patent without stenosis. Internal carotid  artery is occluded at its origin without reconstitution in the neck. There is mild narrowing of  the ECA origin. Left carotid system: Moderate atherosclerotic plaque at the carotid bifurcation without significant common or internal carotid artery stenosis. Mild proximal ECA stenosis. Vertebral arteries: Vertebral arteries are patent and codominant. There is mild right V2 narrowing at the C2 and C5 levels due to calcified plaque. There is mild left V2 narrowing due to degenerative vertebral spurring at C5-6 and C6-7. Skeleton: Moderate to advanced disc degeneration from C4-5 to C6-7. Other neck: 8 mm calculus in the distal left submandibular duct without significant ductal dilatation or inflammatory change. Small left thyroid nodule. Upper chest: Interlobular septal thickening in the lung apices with partially visualized ground-glass opacity posteriorly in the left upper lobe and superior segment segments of the left greater than right upper lobes. Review of the MIP images confirms the above findings CTA HEAD FINDINGS Anterior circulation: Chronically occluded right ICA with reconstitution of the terminus via a small but patent right A1 segment, unchanged. Right M1 segment is widely patent. There is at most mild narrowing of the proximal right M2 inferior division. Decreased number of distal right MCA branch vessels in the right parietal region is chronic. Intracranial left ICA is patent with extensive atherosclerosis though only resulting in at most mild stenosis. 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 Left A1 segment is widely patent, as is the anterior communicating artery. A2 segments are patent. Left MCA is patent without evidence of major branch occlusion or significant proximal stenosis. Posterior circulation: Intracranial vertebral arteries are patent to the basilar with prominent  atherosclerosis, greater on the right. There is severe proximal right V4 stenosis with additional moderate tandem stenoses. PICA origins appear patent, potentially with a high-grade stenosis on the right. AICA and SCA origins are patent. Basilar artery is patent without stenosis. Posterior communicating arteries are not identified. PCAs are patent with mild distal left P1 narrowing. No aneurysm. Venous sinuses: Patent. Anatomic variants: Small right A1. Review of the MIP images confirms the above findings CT CEREBRAL PERFUSION FINDINGS There is mildly prolonged transit time throughout most of the right MCA territory, with slightly greater delayed transit (6 seconds greater than the contralateral side) in the deep watershed territory/centrum semiovale. Cerebral blood flow is preserved. Cerebral blood volume is also normal to mildly increased. No diminished CBF/CBV suggestive of acute infarction is seen. IMPRESSION: 1. 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 2. Prolonged transit time throughout the right MCA territory due to #1 with compensated cerebral blood flow parameters. No evidence of acute infarct. 3. Chronically decreased number of distal right MCA branch vessels. No evidence of acute major branch occlusion. 4. Severe right V4 vertebral artery stenosis. 5. 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 6. Aortic atherosclerosis. 7. 8 mm left submandibular duct calculus. 8. Interlobular septal thickening and ground-glass opacities in lung apices, possibly reflecting edema. Correlate with chest radiography. These results were called by telephone at the time of interpretation on 11/03/2015 at 4:00 pm to Dr. Desmond Lope, who verbally acknowledged these results. Electronically Signed   By: Sebastian Ache M.D.   On: 11/03/2015 16:31   Dg Chest Port 1 View  Result Date: 11/12/2015 CLINICAL DATA:  Increasing shortness of breath. EXAM: PORTABLE CHEST 1 VIEW  COMPARISON:  11/05/2015 FINDINGS: Cardiomegaly noted. Increasing moderate to severe bilateral pulmonary edema noted. A right IJ central venous catheter with tip overlying the lower SVC again noted. There may be trace bilateral pleural  effusions present. There is no evidence of pneumothorax. IMPRESSION: Increasing moderate to severe pulmonary edema. Electronically Signed   By: Harmon PierJeffrey  Hu M.D.   On: 11/12/2015 17:31   Dg Chest Port 1 View  Result Date: 11/05/2015 CLINICAL DATA:  Hypoxia EXAM: PORTABLE CHEST 1 VIEW COMPARISON:  November 03, 2015 FINDINGS: Endotracheal tube and nasogastric tube have been removed. Central catheter tip is in the superior vena cava. No pneumothorax. There is a focal area of consolidation in the right upper lobe medially. There is right base atelectasis. Lungs elsewhere clear. Heart is upper normal in size with pulmonary vascularity within normal limits. There is atherosclerotic calcification in the aorta. No adenopathy. IMPRESSION: Small area of airspace consolidation in the right upper lobe medially, likely infiltrate. Mild right base atelectasis. Lungs elsewhere clear. Stable cardiac silhouette. Aortic atherosclerosis. No pneumothorax. Electronically Signed   By: Bretta BangWilliam  Woodruff III M.D.   On: 11/05/2015 10:56   Dg Chest Port 1 View  Result Date: 11/03/2015 CLINICAL DATA:  Right central venous line placement EXAM: PORTABLE CHEST 1 VIEW COMPARISON:  08/18/2015 chest radiograph. FINDINGS: Right internal jugular central venous catheter terminates in the upper third of the superior vena cava. Stable cardiomediastinal silhouette with normal heart size with aortic atherosclerosis. No pneumothorax. No pleural effusion. Mild diffuse prominence of the central interstitial markings. No acute consolidative airspace disease. IMPRESSION: 1. Right internal jugular central venous catheter terminates in the upper third of the superior vena cava. No pneumothorax. 2. Top-normal heart size.  Prominence of the central interstitial markings, favor vascular crowding due to low lung volumes . 3. Aortic atherosclerosis. Electronically Signed   By: Delbert PhenixJason A Poff M.D.   On: 11/03/2015 17:31   Dg Chest Port 1v Same Day  Result Date: 11/03/2015 CLINICAL DATA:  Post intubation. EXAM: PORTABLE CHEST 1 VIEW COMPARISON:  11/03/2015 FINDINGS: Patient is status post intubation. Endotracheal tube overlies the tracheal air column and terminates 15 mm above the carina. Retraction with approximately 1-2 cm may be considered. Enteric catheter has been placed, tip collimated off the image. Right IJ approach central venous catheter is stable. Cardiomediastinal silhouette is normal. Mediastinal contours appear intact. Calcific atherosclerotic disease of the aorta seen. There is no evidence of pneumothorax. Low lung volumes with interstitial pulmonary edema. Osseous structures are without acute abnormality. Soft tissues are grossly normal. IMPRESSION: Status post intubation. Slight retraction of the endotracheal tube may be considered. Interstitial pulmonary edema. Electronically Signed   By: Ted Mcalpineobrinka  Dimitrova M.D.   On: 11/03/2015 19:17   Dg Ang/ext/uni/or Left  Result Date: 11/02/2015 CLINICAL DATA:  65 year old female undergoing distal bypass EXAM: LEFT ANG/EXT/UNI/ OR COMPARISON:  Prior intraoperative angiographic images 8 09/17/2015 FINDINGS: Single cross-table lateral intraoperative image demonstrates what appears to be a patent saphenous vein bypass graft attaching to the distal popliteal artery below the knee joint. The popliteal artery is diffusely diseased. Primary runoff to the ankle is via the peroneal artery. The anterior tibial artery is occluded proximally but reconstitutes distally. The posterior tibial artery is occluded. IMPRESSION: Intraoperative arteriogram as above. Electronically Signed   By: Malachy MoanHeath  McCullough M.D.   On: 11/02/2015 14:26   Dg Abd Portable 1v  Result Date: 11/03/2015 CLINICAL  DATA:  Orogastric tube placement. EXAM: PORTABLE ABDOMEN - 1 VIEW COMPARISON:  None. FINDINGS: The bowel gas pattern is normal. Enteric catheter is seen overlying the expected location of gastric body. Bilateral iliac vascular stents are seen. Soft tissue details are obscured by a motion artifact. IMPRESSION: Nonobstructive bowel gas  pattern. Enteric catheter overlies the expected location of gastric body. Electronically Signed   By: Ted Mcalpine M.D.   On: 11/03/2015 19:20   Dg Foot Complete Left  Result Date: 10/30/2015 CLINICAL DATA:  Dorsal foot pain. Skin lesion with possible wound infection. EXAM: LEFT FOOT - COMPLETE 3+ VIEW COMPARISON:  09/19/2015 FINDINGS: There is definite destructive osteomyelitis of the fifth toe affecting the distal metatarsal an the proximal phalanx. There is probable osteomyelitis of the fourth toe affecting the distal metatarsal an the proximal phalanx. There is questionable osteomyelitis of the proximal phalanx of the third toe. IMPRESSION: Osteomyelitis of the forefoot, definite affecting the fifth toe, probable affecting the fourth toe and possible affecting the third toe. See above. Electronically Signed   By: Paulina Fusi M.D.   On: 10/30/2015 20:05   Ct Head Code Stroke Wo Contrast  Addendum Date: 11/03/2015   ADDENDUM REPORT: 11/03/2015 15:12 ADDENDUM: These results were called by telephone at the time of interpretation on 11/03/2015 at 3:05 pm to Dr. Desmond Lope, who verbally acknowledged these results. Electronically Signed   By: Sebastian Ache M.D.   On: 11/03/2015 15:12   Result Date: 11/03/2015 CLINICAL DATA:  Code stroke. Slurred speech with left-sided deficits. EXAM: CT HEAD WITHOUT CONTRAST TECHNIQUE: Contiguous axial images were obtained from the base of the skull through the vertex without intravenous contrast. COMPARISON:  Brain MRI 03/03/2013 and CT 03/02/2013 FINDINGS: Brain: Mild motion artifact. There is no evidence of acute cortical infarct, intracranial  hemorrhage, mass, midline shift, or extra-axial fluid collection. Ventricles and sulci are normal in size for age. Patchy cerebral white matter hypodensities are stable to slightly progressive and nonspecific but compatible with mild chronic small vessel ischemic disease. A small chronic left cerebellar infarct is unchanged. Vascular: Calcified atherosclerosis at the skullbase. Skull: No fracture or focal osseous lesion. Sinuses/Orbits: Prior right cataract extraction.  Clear sinuses. Other: None. ASPECTS [facility] Stroke Program Early CT Score) - Ganglionic level infarction (caudate, lentiform nuclei, internal capsule, insula, M1-M3 cortex): 7 - Supraganglionic infarction (M4-M6 cortex): 3 Total score (0-10 with 10 being normal): 10 IMPRESSION: 1. No evidence of acute intracranial abnormality. 2. ASPECTS is 10. 3. Mild chronic small vessel ischemic disease. Chronic left cerebellar infarct. Electronically Signed: By: Sebastian Ache M.D. On: 11/03/2015 14:48    Time Spent in minutes  35   Eddie North M.D on 11/13/2015 at 11:05 AM  Between 7am to 7pm - Pager - [phone]  After 7pm go to [email or web address] - password [facility]  [facility] -  Office  [phone]

## 2015-11-13 NOTE — Progress Notes (Signed)
RN removed BiPAP for mouth care, patient looks well with a SPO2 of 97% on Room Air, and a rate of 22. Patient not placed back on BiPAP and RT will continue to monitor.

## 2015-11-13 NOTE — Progress Notes (Signed)
   VASCULAR SURGERY ASSESSMENT & PLAN:  11 Days Post-Op s/p: Left fem pop bypass with vein.  Graft patent.  I will arrange f/u in my office.   SUBJECTIVE: No specific complaints  PHYSICAL EXAM: Vitals:   11/12/15 2300 11/13/15 0335 11/13/15 0500 11/13/15 0722  BP: (!) 124/51 (!) 144/63  (!) 150/49  Pulse: 93 (!) 109  (!) 102  Resp: (!) 25 (!) 27  19  Temp: 99 F (37.2 C) 99 F (37.2 C)  98.1 F (36.7 C)  TempSrc: Axillary Axillary  Oral  SpO2: 100% 100%  95%  Weight:   177 lb 0.5 oz (80.3 kg)   Height:       Good ATA signal with doppler left leg Incisions look fine  LABS: Lab Results  Component Value Date   WBC 10.3 11/12/2015   HGB 7.1 (L) 11/12/2015   HCT 21.8 (L) 11/12/2015   MCV 88.6 11/12/2015   PLT 395 11/12/2015   Lab Results  Component Value Date   CREATININE 0.54 11/12/2015   Lab Results  Component Value Date   INR 1.21 11/04/2015   CBG (last 3)   Recent Labs  11/12/15 1211 11/12/15 2141 11/13/15 0733  GLUCAP 171* 195* 104*    Principal Problem:   Osteomyelitis of left foot (HCC) Active Problems:   Diabetes mellitus type II, uncontrolled (HCC)   Hyperlipidemia with target LDL less than 70   PVD (peripheral vascular disease) with claudication (HCC)   Essential hypertension   Malnutrition of moderate degree   Diabetic foot ulcer (HCC)   S/P femoral-popliteal bypass surgery   Septic shock (HCC)   Encephalopathy acute   Acute blood loss anemia   Acute respiratory failure with hypoxia (HCC)   CVA (cerebral vascular accident) (HCC)   Central line infection, initial encounter   Demand myocardial infarction   Surgery, elective   Traumatic hemorrhagic shock (HCC)   Chronic systolic CHF (congestive heart failure) (HCC)   Uncontrolled type 2 diabetes mellitus with complication (HCC)    Cari Caraway Beeper: [phone] 11/13/2015

## 2015-11-13 NOTE — Progress Notes (Signed)
Patient is tolerating 2L BiPAP well with respirations of 23, SpO2 of 97% and no signs of respiratory distress. RT will continue to monitor.

## 2015-11-14 ENCOUNTER — Encounter (HOSPITAL_COMMUNITY): Payer: Self-pay | Admitting: Orthopedic Surgery

## 2015-11-14 ENCOUNTER — Ambulatory Visit: Payer: Medicare Other | Admitting: Cardiovascular Disease

## 2015-11-14 DIAGNOSIS — Z794 Long term (current) use of insulin: Secondary | ICD-10-CM | POA: Diagnosis not present

## 2015-11-14 DIAGNOSIS — M1990 Unspecified osteoarthritis, unspecified site: Secondary | ICD-10-CM | POA: Diagnosis not present

## 2015-11-14 DIAGNOSIS — Z91040 Latex allergy status: Secondary | ICD-10-CM

## 2015-11-14 DIAGNOSIS — E1140 Type 2 diabetes mellitus with diabetic neuropathy, unspecified: Secondary | ICD-10-CM | POA: Diagnosis not present

## 2015-11-14 DIAGNOSIS — I739 Peripheral vascular disease, unspecified: Secondary | ICD-10-CM | POA: Diagnosis not present

## 2015-11-14 DIAGNOSIS — R531 Weakness: Secondary | ICD-10-CM | POA: Diagnosis not present

## 2015-11-14 DIAGNOSIS — A419 Sepsis, unspecified organism: Secondary | ICD-10-CM | POA: Diagnosis not present

## 2015-11-14 DIAGNOSIS — E1151 Type 2 diabetes mellitus with diabetic peripheral angiopathy without gangrene: Secondary | ICD-10-CM | POA: Diagnosis not present

## 2015-11-14 DIAGNOSIS — I214 Non-ST elevation (NSTEMI) myocardial infarction: Secondary | ICD-10-CM | POA: Diagnosis not present

## 2015-11-14 DIAGNOSIS — Z833 Family history of diabetes mellitus: Secondary | ICD-10-CM | POA: Diagnosis not present

## 2015-11-14 DIAGNOSIS — Z961 Presence of intraocular lens: Secondary | ICD-10-CM | POA: Diagnosis not present

## 2015-11-14 DIAGNOSIS — T794XXD Traumatic shock, subsequent encounter: Secondary | ICD-10-CM

## 2015-11-14 DIAGNOSIS — E440 Moderate protein-calorie malnutrition: Secondary | ICD-10-CM

## 2015-11-14 DIAGNOSIS — B954 Other streptococcus as the cause of diseases classified elsewhere: Secondary | ICD-10-CM | POA: Diagnosis not present

## 2015-11-14 DIAGNOSIS — I252 Old myocardial infarction: Secondary | ICD-10-CM | POA: Diagnosis not present

## 2015-11-14 DIAGNOSIS — K219 Gastro-esophageal reflux disease without esophagitis: Secondary | ICD-10-CM | POA: Diagnosis not present

## 2015-11-14 DIAGNOSIS — Z945 Skin transplant status: Secondary | ICD-10-CM | POA: Diagnosis not present

## 2015-11-14 DIAGNOSIS — M869 Osteomyelitis, unspecified: Secondary | ICD-10-CM | POA: Diagnosis not present

## 2015-11-14 DIAGNOSIS — M868X7 Other osteomyelitis, ankle and foot: Secondary | ICD-10-CM | POA: Diagnosis not present

## 2015-11-14 DIAGNOSIS — I6523 Occlusion and stenosis of bilateral carotid arteries: Secondary | ICD-10-CM | POA: Diagnosis not present

## 2015-11-14 DIAGNOSIS — Z95828 Presence of other vascular implants and grafts: Secondary | ICD-10-CM | POA: Diagnosis not present

## 2015-11-14 DIAGNOSIS — B951 Streptococcus, group B, as the cause of diseases classified elsewhere: Secondary | ICD-10-CM

## 2015-11-14 DIAGNOSIS — I2510 Atherosclerotic heart disease of native coronary artery without angina pectoris: Secondary | ICD-10-CM | POA: Diagnosis not present

## 2015-11-14 DIAGNOSIS — Z8249 Family history of ischemic heart disease and other diseases of the circulatory system: Secondary | ICD-10-CM | POA: Diagnosis not present

## 2015-11-14 DIAGNOSIS — I639 Cerebral infarction, unspecified: Secondary | ICD-10-CM | POA: Diagnosis not present

## 2015-11-14 DIAGNOSIS — Z955 Presence of coronary angioplasty implant and graft: Secondary | ICD-10-CM | POA: Diagnosis not present

## 2015-11-14 DIAGNOSIS — R6520 Severe sepsis without septic shock: Secondary | ICD-10-CM | POA: Diagnosis not present

## 2015-11-14 DIAGNOSIS — J9601 Acute respiratory failure with hypoxia: Secondary | ICD-10-CM | POA: Diagnosis not present

## 2015-11-14 DIAGNOSIS — E1142 Type 2 diabetes mellitus with diabetic polyneuropathy: Secondary | ICD-10-CM | POA: Diagnosis not present

## 2015-11-14 DIAGNOSIS — M6281 Muscle weakness (generalized): Secondary | ICD-10-CM | POA: Diagnosis not present

## 2015-11-14 DIAGNOSIS — R2681 Unsteadiness on feet: Secondary | ICD-10-CM | POA: Diagnosis not present

## 2015-11-14 DIAGNOSIS — Z90710 Acquired absence of both cervix and uterus: Secondary | ICD-10-CM | POA: Diagnosis not present

## 2015-11-14 DIAGNOSIS — L89150 Pressure ulcer of sacral region, unstageable: Secondary | ICD-10-CM | POA: Diagnosis not present

## 2015-11-14 DIAGNOSIS — I110 Hypertensive heart disease with heart failure: Secondary | ICD-10-CM | POA: Diagnosis not present

## 2015-11-14 DIAGNOSIS — Z4789 Encounter for other orthopedic aftercare: Secondary | ICD-10-CM | POA: Diagnosis not present

## 2015-11-14 DIAGNOSIS — L97529 Non-pressure chronic ulcer of other part of left foot with unspecified severity: Secondary | ICD-10-CM | POA: Diagnosis not present

## 2015-11-14 DIAGNOSIS — Z880 Allergy status to penicillin: Secondary | ICD-10-CM | POA: Diagnosis not present

## 2015-11-14 DIAGNOSIS — S91302A Unspecified open wound, left foot, initial encounter: Secondary | ICD-10-CM | POA: Diagnosis not present

## 2015-11-14 DIAGNOSIS — R918 Other nonspecific abnormal finding of lung field: Secondary | ICD-10-CM | POA: Diagnosis not present

## 2015-11-14 DIAGNOSIS — Z888 Allergy status to other drugs, medicaments and biological substances status: Secondary | ICD-10-CM

## 2015-11-14 DIAGNOSIS — I509 Heart failure, unspecified: Secondary | ICD-10-CM | POA: Diagnosis not present

## 2015-11-14 DIAGNOSIS — M86172 Other acute osteomyelitis, left ankle and foot: Secondary | ICD-10-CM | POA: Diagnosis not present

## 2015-11-14 DIAGNOSIS — E1169 Type 2 diabetes mellitus with other specified complication: Secondary | ICD-10-CM | POA: Diagnosis not present

## 2015-11-14 DIAGNOSIS — E11621 Type 2 diabetes mellitus with foot ulcer: Secondary | ICD-10-CM | POA: Diagnosis not present

## 2015-11-14 DIAGNOSIS — I5022 Chronic systolic (congestive) heart failure: Secondary | ICD-10-CM | POA: Diagnosis not present

## 2015-11-14 DIAGNOSIS — E785 Hyperlipidemia, unspecified: Secondary | ICD-10-CM | POA: Diagnosis not present

## 2015-11-14 DIAGNOSIS — Z9841 Cataract extraction status, right eye: Secondary | ICD-10-CM | POA: Diagnosis not present

## 2015-11-14 DIAGNOSIS — E08621 Diabetes mellitus due to underlying condition with foot ulcer: Secondary | ICD-10-CM | POA: Diagnosis not present

## 2015-11-14 DIAGNOSIS — Z823 Family history of stroke: Secondary | ICD-10-CM | POA: Diagnosis not present

## 2015-11-14 DIAGNOSIS — L97425 Non-pressure chronic ulcer of left heel and midfoot with muscle involvement without evidence of necrosis: Secondary | ICD-10-CM | POA: Diagnosis not present

## 2015-11-14 DIAGNOSIS — Z9889 Other specified postprocedural states: Secondary | ICD-10-CM | POA: Diagnosis not present

## 2015-11-14 DIAGNOSIS — I998 Other disorder of circulatory system: Secondary | ICD-10-CM | POA: Diagnosis not present

## 2015-11-14 DIAGNOSIS — D62 Acute posthemorrhagic anemia: Secondary | ICD-10-CM | POA: Diagnosis not present

## 2015-11-14 DIAGNOSIS — Z8673 Personal history of transient ischemic attack (TIA), and cerebral infarction without residual deficits: Secondary | ICD-10-CM | POA: Diagnosis not present

## 2015-11-14 DIAGNOSIS — S7012XD Contusion of left thigh, subsequent encounter: Secondary | ICD-10-CM | POA: Diagnosis not present

## 2015-11-14 DIAGNOSIS — Z9289 Personal history of other medical treatment: Secondary | ICD-10-CM | POA: Diagnosis not present

## 2015-11-14 DIAGNOSIS — E118 Type 2 diabetes mellitus with unspecified complications: Secondary | ICD-10-CM | POA: Diagnosis not present

## 2015-11-14 DIAGNOSIS — M79609 Pain in unspecified limb: Secondary | ICD-10-CM | POA: Diagnosis not present

## 2015-11-14 LAB — GLUCOSE, CAPILLARY
GLUCOSE-CAPILLARY: 115 mg/dL — AB (ref 65–99)
Glucose-Capillary: 196 mg/dL — ABNORMAL HIGH (ref 65–99)

## 2015-11-14 MED ORDER — FLUCONAZOLE 100 MG PO TABS: 100.0000 mg | ORAL_TABLET | Freq: Every day | ORAL | 0 refills | Status: AC

## 2015-11-14 MED ORDER — POLYETHYLENE GLYCOL 3350 17 G PO PACK: 17.0000 g | PACK | Freq: Every day | ORAL | 0 refills | Status: DC | PRN

## 2015-11-14 MED ORDER — FUROSEMIDE 20 MG PO TABS: 20.0000 mg | ORAL_TABLET | Freq: Every day | ORAL | 0 refills | Status: DC | PRN

## 2015-11-14 MED ORDER — PRO-STAT SUGAR FREE PO LIQD: 30.0000 mL | Freq: Two times a day (BID) | ORAL | 0 refills | Status: DC

## 2015-11-14 MED ORDER — DEXTROSE 5 % IV SOLN: 2.0000 g | INTRAVENOUS | 0 refills | Status: AC

## 2015-11-14 MED ORDER — OXYCODONE-ACETAMINOPHEN 5-325 MG PO TABS: 2.0000 | ORAL_TABLET | Freq: Four times a day (QID) | ORAL | 0 refills | Status: DC | PRN

## 2015-11-14 NOTE — Care Management Note (Signed)
Case Management Note  Patient Details  Name: HENNESSEY KOTECKI MRN: [id number] Date of Birth: Jun 26, 1950  Subjective/Objective:    Post op day 6 for left fem pop, has osteo left foot. Patient 's grand daughter lives with her and she works every day.  Per pt eval rec snf.  CSW notified.  Patient is receiving hydrotherapy from physical therapy.  Previous plan was for [facility] but since then has been changed to SNF. Plan for left foot surgical debridement plantar ulcer with placement of infusion wound VAC on Friday morning .  11/14/15 1256 Letha Cape RN, CM- patient for dc to snf today with a transport wound vac, patient also has a sponge dressing that will go with her.  Per CSW [facility] states they will have to order wound vac.  NCM informed CSW that the transport vac will be with patient until they order one.  CSW states SNF needs to know the size of the sponge, NCM informed CSW it is a small, also gave CSW the measurments of wound.                               Action/Plan:   Expected Discharge Date:  11/02/15               Expected Discharge Plan:  Skilled Nursing Facility  In-House Referral:  Clinical Social Work  Discharge planning Services  CM Consult  Post Acute Care Choice:    Choice offered to:     DME Arranged:    DME Agency:     HH Arranged:    HH Agency:     Status of Service:  Completed, signed off  If discussed at [employer], dates discussed:    Additional Comments:  Leone Haven, RN 11/14/2015, 12:55 PM

## 2015-11-14 NOTE — Clinical Social Work Placement (Signed)
   CLINICAL SOCIAL WORK PLACEMENT  NOTE  Date:  11/14/2015  Patient Details  Name: [name] [name] MRN: [id number] Date of Birth: 07-Nov-1950  Clinical Social Work is seeking post-discharge placement for this patient at the Skilled  Nursing Facility level of care (*CSW will initial, date and re-position this form in  chart as items are completed):      Patient/family provided with [facility] Clinical Social Work Department's list of facilities offering this level of care within the geographic area requested by the patient (or if unable, by the patient's family).      Patient/family informed of their freedom to choose among providers that offer the needed level of care, that participate in Medicare, Medicaid or managed care program needed by the patient, have an available bed and are willing to accept the patient.      Patient/family informed of [facility]'s ownership interest in [facility] and [facility], as well as of the fact that they are under no obligation to receive care at these facilities.  PASRR submitted to EDS on 11/09/15     PASRR number received on 11/09/15     Existing PASRR number confirmed on       FL2 transmitted to all facilities in geographic area requested by pt/family on 11/09/15     FL2 transmitted to all facilities within larger geographic area on       Patient informed that his/her managed care company has contracts with or will negotiate with certain facilities, including the following:        Yes   Patient/family informed of bed offers received.  Patient chooses bed at [facility]     Physician recommends and patient chooses bed at      Patient to be transferred to [facility] on 11/14/15.  Patient to be transferred to facility by [facility]     Patient family notified on 11/14/15 of transfer.  Name of family member notified:  Sister at bedside     PHYSICIAN       Additional Comment:     _______________________________________________ Margarito Liner, LCSW 11/14/2015, 2:23 PM

## 2015-11-14 NOTE — Progress Notes (Signed)
Called report to [employer] and spoke with Orthoptist. New wound vac ordered and switched out for transport. Pt on room air. Marisue Ivan RN

## 2015-11-14 NOTE — Clinical Social Work Note (Signed)
CSW facilitated patient discharge including contacting patient family and facility to confirm patient discharge plans. Clinical information faxed to facility and family agreeable with plan. CSW arranged ambulance transport via [facility] to [facility] around 4:15 pm. RN to call report prior to discharge [phone]).  CSW will sign off for now as social work intervention is no longer needed. Please consult [place] again if new needs arise.  Charlynn Court, CSW [phone]

## 2015-11-14 NOTE — Discharge Summary (Signed)
Physician Discharge Summary  [name] [name] FYB:[id number] DOB: 01-Jul-1950 DOA: 10/30/2015  PCP: Wenda Low, MD  Admit date: 10/30/2015 Discharge date: 11/14/2015  Admitted From: Home Disposition: Skilled nursing facility ([facility])  Recommendations for Outpatient Follow-up:  #1 Patient to follow-up with orthopedics Dr. Sharol Given in 1 week and vascular surgeon Dr. Scot Dock in 2 weeks. #2 Patient being discharged on IV ceftriaxone 2 g daily (via PICC line) for total 6 weeks  (stop date for antibiotic 12/24/2015).  Please check weekly CBC with potential,BMET, CRP and ESR while on antibiotics and fax results to Dr. Baxter Flattery AT [phone]. Patient should follow-up with Dr. Baxter Flattery after completion of antibiotics. Please remove PICC line after completion of antibiotics.   Home Health: Oxygen as needed Equipment/Devices: Left foot Wound VAC  Discharge Condition: Fair CODE STATUS: Full code Diet recommendation: Heart Healthy / Carb Modified with supplements    Discharge Diagnoses:  Principal Problem:   Septic shock ([place])   Active Problems:   Osteomyelitis of left foot (HCC)   Demand myocardial infarction   Traumatic hemorrhagic shock (HCC)   Hyperlipidemia with target LDL less than 70   PVD (peripheral vascular disease) with claudication (HCC)   Essential hypertension   Malnutrition of moderate degree   Diabetic foot ulcer (HCC)   S/P femoral-popliteal bypass surgery   Encephalopathy acute   Acute blood loss anemia   Acute respiratory failure with hypoxia (HCC)   CVA (cerebral vascular accident) ([place])   Chronic systolic CHF (congestive heart failure) ([place])   Uncontrolled type 2 diabetes mellitus with complication (HCC)  Brief narrative/history of present illness Please refer to admission H&P for details, in brief,65 year old female with history of diabetes mellitus peripheral vascular disease status post revascularization, history of MI, hypertension, hyperlipidemia, peripheral  neuropathy resented with left foot ostium myelitis admitted on 10/2 and underwent left fem-pop bypass on 11/02/15. She was evaluated by orthopedics who recommended surgical debridement. However on 10/60 tablet acute slurred speech with left-sided weakness and encephalopathy and then went into shock. She was also found to have drop in her blood 5.8 secondary to bleeding into her left thigh. She was intubated for airway protection and successfully extubated the next day. Hemoglobin improved with transfusion. She also developed NSTEMI. Patient was taken to OR for surgical debridement of the osteomyelitis / diabetic foot ulcer on 10/13.  On 10/15 patient was transfused 1 unit PRBC and shortly after transfusion completed, went into acute respiratory distress with pulmonary edema. Given IV Lasix and placed on BiPAP and transferred to stepdown unit. Patient required BiPAP overnight and diuresed well after which she has been stable on room air.   Hospital course Principal Problem:   Osteomyelitis of left foot ([place]) Secondary to uncontrolled diabetes with diabetic foot ulcer and peripheral vascular disease Received empiric vancomycin and cefepime. Debridement Cultures growing few Strep Agalactiae.  -Switched to regular wound VAC. Pain control with as needed Vicodin (has not required in medications much) ID recommend 6 weeks of IV Rocephin. -An upper extremity PICC line has been placed for IV antibiotics until 12/24/2015. (See recommendations above)  Active Problems: Acute hypoxemic respiratory failure Secondary to hemorrhagic/septic shock,NSTEMI, infection and narcotics. Went into acute pulmonary edema on 10/16. Improved with BiPAP and diuretics. Now stable on room air.  Septic shock Resolved. Antibiotic course as above.    Diabetes mellitus type II, uncontrolled ([place]) History of neuropathy and peripheral vascular disease. A1c of 7. Continue home dose Lantus with sliding scale coverage.. CBG  stable.   Acute  encephalopathy with possible right brainstem/subcortical infarct Head CT unremarkable. Appreciate neurology consult. No focal neurological deficit. Continue antiplatelet therapy.  NSTEMI chronic systolic CHF Aspirin, Plavix, beta blocker and statin.. Echo shows normal EF with grade 1 diastolic dysfunction.  Hemorrhagic shock secondary to bleeding into left thigh H&H improved with transfusion.     PVD (peripheral vascular disease) with claudication (HCC) S/p fem-pop bypass. Will follow-up with Dr. Scot Dock in 2 weeks   Hypokalemia Replenished  Moderate protein calorie malnutrition Added supplement.   Family Communication  :  sister and daughter at bedside  Disposition Plan  : SNF   Consults  :   Vascular surgery (Dr. Scot Dock) Orthopedics (Dr. Sharol Given) ID  PCCM   Procedures  :  Left Fem pop bypass Debridement of left foot ulcer wound VAC placement Intubation 2-D echo   Discharge Instructions  Discharge Instructions    Change dressing    Complete by:  As directed    Wash wound left foot with soap and water daily, apply santyl and dry dressing daily       Medication List    TAKE these medications   amLODipine 5 MG tablet Commonly known as:  NORVASC TAKE 1 TABLET(5 MG) BY MOUTH DAILY   aspirin EC 81 MG tablet Take 81 mg by mouth daily.   atorvastatin 80 MG tablet Commonly known as:  LIPITOR Take 1 tablet (80 mg total) by mouth daily.   cefTRIAXone 2 g in dextrose 5 % 50 mL Inject 2 g into the vein daily. Start taking on:  11/15/2015   clopidogrel 75 MG tablet Commonly known as:  PLAVIX Take 1 tablet (75 mg total) by mouth daily. Holding for vascular surgery, restart as soon as possible after leg surgery (after 8/8)   collagenase ointment Commonly known as:  SANTYL Apply 1 application topically daily. What changed:  See the new instructions.   collagenase ointment Commonly known as:  SANTYL Apply topically  daily. What changed:  You were already taking a medication with the same name, and this prescription was added. Make sure you understand how and when to take each.   feeding supplement (PRO-STAT SUGAR FREE 64) Liqd Take 30 mLs by mouth 2 (two) times daily.   fluconazole 100 MG tablet Commonly known as:  DIFLUCAN Take 1 tablet (100 mg total) by mouth daily. Start taking on:  11/15/2015   furosemide 20 MG tablet Commonly known as:  LASIX Take 1 tablet (20 mg total) by mouth daily as needed for fluid or edema (shortness of breath).   gabapentin 300 MG capsule Commonly known as:  NEURONTIN Take 300 mg by mouth 3 (three) times daily.   Insulin Glargine 100 UNIT/ML Solostar Pen Commonly known as:  LANTUS Inject 35 Units into the skin daily at 10 pm.   isosorbide mononitrate 60 MG 24 hr tablet Commonly known as:  IMDUR Take 90 mg by mouth daily.   ketoconazole 2 % cream Commonly known as:  NIZORAL Apply 1 application topically daily.   losartan 100 MG tablet Commonly known as:  COZAAR TAKE 1 TABLET BY MOUTH EVERY DAY   metFORMIN 1000 MG tablet Commonly known as:  GLUCOPHAGE Take 1,000 mg by mouth 2 (two) times daily with a meal.   metoprolol succinate 50 MG 24 hr tablet Commonly known as:  TOPROL-XL Take 1 tablet (50 mg total) by mouth daily.   OVER THE COUNTER MEDICATION Apply 1 application topically daily. [place]   oxyCODONE-acetaminophen 5-325 MG tablet Commonly known as:  PERCOCET/ROXICET Take 2 tablets by mouth every 6 (six) hours as needed for moderate pain. What changed:  how much to take   pantoprazole 40 MG tablet Commonly known as:  PROTONIX Take 1 tablet (40 mg total) by mouth daily.   polyethylene glycol packet Commonly known as:  MIRALAX / GLYCOLAX Take 17 g by mouth daily as needed.      Follow-up Information    Deitra Mayo, MD Follow up in 2 week(s).   Specialties:  Vascular Surgery, Cardiology Why:  Office will call you to arrange your  appt (sent) Contact information: [place] [address] [phone]        Newt Minion, MD Follow up in 1 week(s).   Specialty:  Orthopedic Surgery Contact information: [place] [address] [phone]        Carlyle Basques, MD Follow up in 6 week(s).   Specialty:  Infectious Diseases Why:  after completion of antibiotics Contact information: [address] [place] [address] [phone]          Allergies  Allergen Reactions  . Hydrochlorothiazide Other (See Comments)    Unknown  . Latex Rash  . Penicillins Swelling and Rash    Pt states she has tolerated Keflex in the past without problems. States she may have tolerated Augmentin in the past but it caused GI upset.      Procedures/Studies: Ct Angio Head W Or Wo Contrast  Result Date: 11/03/2015 CLINICAL DATA:  Left-sided weakness and aphasia. EXAM: CT ANGIOGRAPHY HEAD AND NECK CT CEREBRAL PERFUSION WITH CONTRAST TECHNIQUE: Multidetector CT imaging of the head and neck was performed using the standard protocol during bolus administration of intravenous contrast. Multiplanar CT image reconstructions and MIPs were obtained to evaluate the vascular anatomy. Carotid stenosis measurements (when applicable) are obtained utilizing NASCET criteria, using the distal internal carotid diameter as the denominator. CT cerebral perfusion imaging was also performed during the dynamic injection of intravenous contrast. Post processing was performed. CONTRAST:  90 mL Isovue 370 COMPARISON:  Head MRI/ MRA 09/20/2013 FINDINGS: CTA NECK FINDINGS Aortic arch: Three vessel aortic arch with extensive atherosclerotic calcified plaque. There is approximately 60% stenosis of the proximal brachiocephalic artery and proximal right subclavian artery. Atherosclerotic plaque in a focal when or intimal flap in the proximal left subclavian artery contribute to 65% stenosis. Right carotid system:  Common carotid artery is patent without stenosis. Internal carotid artery is occluded at its origin without reconstitution in the neck. There is mild narrowing of the ECA origin. Left carotid system: Moderate atherosclerotic plaque at the carotid bifurcation without significant common or internal carotid artery stenosis. Mild proximal ECA stenosis. Vertebral arteries: Vertebral arteries are patent and codominant. There is mild right V2 narrowing at the C2 and C5 levels due to calcified plaque. There is mild left V2 narrowing due to degenerative vertebral spurring at C5-6 and C6-7. Skeleton: Moderate to advanced disc degeneration from C4-5 to C6-7. Other neck: 8 mm calculus in the distal left submandibular duct without significant ductal dilatation or inflammatory change. Small left thyroid nodule. Upper chest: Interlobular septal thickening in the lung apices with partially visualized ground-glass opacity posteriorly in the left upper lobe and superior segment segments of the left greater than right upper lobes. Review of the MIP images confirms the above findings CTA HEAD FINDINGS Anterior circulation: Chronically occluded right ICA with reconstitution of the terminus via a small but patent right A1 segment, unchanged. Right M1 segment is  widely patent. There is at most mild narrowing of the proximal right M2 inferior division. Decreased number of distal right MCA branch vessels in the right parietal region is chronic. Intracranial left ICA is patent with extensive atherosclerosis though only resulting in at most mild stenosis. 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 Left A1 segment is widely patent, as is the anterior communicating artery. A2 segments are patent. Left MCA is patent without evidence of major branch occlusion or significant proximal stenosis. Posterior circulation: Intracranial  vertebral arteries are patent to the basilar with prominent atherosclerosis, greater on the right. There is severe proximal right V4 stenosis with additional moderate tandem stenoses. PICA origins appear patent, potentially with a high-grade stenosis on the right. AICA and SCA origins are patent. Basilar artery is patent without stenosis. Posterior communicating arteries are not identified. PCAs are patent with mild distal left P1 narrowing. No aneurysm. Venous sinuses: Patent. Anatomic variants: Small right A1. Review of the MIP images confirms the above findings CT CEREBRAL PERFUSION FINDINGS There is mildly prolonged transit time throughout most of the right MCA territory, with slightly greater delayed transit (6 seconds greater than the contralateral side) in the deep watershed territory/centrum semiovale. Cerebral blood flow is preserved. Cerebral blood volume is also normal to mildly increased. No diminished CBF/CBV suggestive of acute infarction is seen. IMPRESSION: 1. 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 2. Prolonged transit time throughout the right MCA territory due to #1 with compensated cerebral blood flow parameters. No evidence of acute infarct. 3. Chronically decreased number of distal right MCA branch vessels. No evidence of acute major branch occlusion. 4. Severe right V4 vertebral artery stenosis. 5. 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 6. Aortic atherosclerosis. 7. 8 mm left submandibular duct calculus. 8. Interlobular septal thickening and ground-glass opacities in lung apices, possibly reflecting edema. Correlate with chest radiography. These results were called by telephone at the time of interpretation on 11/03/2015 at 4:00 pm to Dr. Gifford Shave, who verbally acknowledged these results. Electronically Signed   By: Logan Bores M.D.   On: 11/03/2015 16:31   Ct Angio Neck W Or Wo Contrast  Result Date: 11/03/2015 CLINICAL  DATA:  Left-sided weakness and aphasia. EXAM: CT ANGIOGRAPHY HEAD AND NECK CT CEREBRAL PERFUSION WITH CONTRAST TECHNIQUE: Multidetector CT imaging of the head and neck was performed using the standard protocol during bolus administration of intravenous contrast. Multiplanar CT image reconstructions and MIPs were obtained to evaluate the vascular anatomy. Carotid stenosis measurements (when applicable) are obtained utilizing NASCET criteria, using the distal internal carotid diameter as the denominator. CT cerebral perfusion imaging was also performed during the dynamic injection of intravenous contrast. Post processing was performed. CONTRAST:  90 mL Isovue 370 COMPARISON:  Head MRI/ MRA 09/20/2013 FINDINGS: CTA NECK FINDINGS Aortic arch: Three vessel aortic arch with extensive atherosclerotic calcified plaque. There is approximately 60% stenosis of the proximal brachiocephalic artery and proximal right subclavian artery. Atherosclerotic plaque in a focal when or intimal flap in the proximal left subclavian artery contribute to 65% stenosis. Right carotid system: Common carotid artery is patent without stenosis. Internal carotid artery is occluded at its origin without reconstitution in the neck. There is mild narrowing of the ECA origin. Left carotid system: Moderate atherosclerotic plaque at the carotid bifurcation without significant common or internal carotid artery stenosis. Mild proximal ECA stenosis. Vertebral arteries: Vertebral arteries are  patent and codominant. There is mild right V2 narrowing at the C2 and C5 levels due to calcified plaque. There is mild left V2 narrowing due to degenerative vertebral spurring at C5-6 and C6-7. Skeleton: Moderate to advanced disc degeneration from C4-5 to C6-7. Other neck: 8 mm calculus in the distal left submandibular duct without significant ductal dilatation or inflammatory change. Small left thyroid nodule. Upper chest: Interlobular septal thickening in the lung  apices with partially visualized ground-glass opacity posteriorly in the left upper lobe and superior segment segments of the left greater than right upper lobes. Review of the MIP images confirms the above findings CTA HEAD FINDINGS Anterior circulation: Chronically occluded right ICA with reconstitution of the terminus via a small but patent right A1 segment, unchanged. Right M1 segment is widely patent. There is at most mild narrowing of the proximal right M2 inferior division. Decreased number of distal right MCA branch vessels in the right parietal region is chronic. Intracranial left ICA is patent with extensive atherosclerosis though only resulting in at most mild stenosis. 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 Left A1 segment is widely patent, as is the anterior communicating artery. A2 segments are patent. Left MCA is patent without evidence of major branch occlusion or significant proximal stenosis. Posterior circulation: Intracranial vertebral arteries are patent to the basilar with prominent atherosclerosis, greater on the right. There is severe proximal right V4 stenosis with additional moderate tandem stenoses. PICA origins appear patent, potentially with a high-grade stenosis on the right. AICA and SCA origins are patent. Basilar artery is patent without stenosis. Posterior communicating arteries are not identified. PCAs are patent with mild distal left P1 narrowing. No aneurysm. Venous sinuses: Patent. Anatomic variants: Small right A1. Review of the MIP images confirms the above findings CT CEREBRAL PERFUSION FINDINGS There is mildly prolonged transit time throughout most of the right MCA territory, with slightly greater delayed transit (6 seconds greater than the contralateral side) in the deep watershed territory/centrum semiovale. Cerebral blood flow is preserved. Cerebral  blood volume is also normal to mildly increased. No diminished CBF/CBV suggestive of acute infarction is seen. IMPRESSION: 1. 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 2. Prolonged transit time throughout the right MCA territory due to #1 with compensated cerebral blood flow parameters. No evidence of acute infarct. 3. Chronically decreased number of distal right MCA branch vessels. No evidence of acute major branch occlusion. 4. Severe right V4 vertebral artery stenosis. 5. 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 6. Aortic atherosclerosis. 7. 8 mm left submandibular duct calculus. 8. Interlobular septal thickening and ground-glass opacities in lung apices, possibly reflecting edema. Correlate with chest radiography. These results were called by telephone at the time of interpretation on 11/03/2015 at 4:00 pm to Dr. Gifford Shave, who verbally acknowledged these results. Electronically Signed   By: Logan Bores M.D.   On: 11/03/2015 16:31   Mr Foot Left W Wo Contrast  Result Date: 11/02/2015 CLINICAL DATA:  Diabetic foot ulcer, assessment for osteomyelitis. EXAM: MRI OF THE LEFT FOREFOOT WITHOUT AND WITH CONTRAST TECHNIQUE: Multiplanar, multisequence MR imaging was performed both before and after administration of intravenous contrast. CONTRAST:  15m MULTIHANCE GADOBENATE DIMEGLUMINE 529 MG/ML IV SOLN COMPARISON:  10/30/2015 FINDINGS: Osteomyelitis protocol MRI of the foot was obtained, to include the entire foot and ankle. This protocol uses a large field of view to cover the entire  foot and ankle, and is suitable for assessing bony structures for osteomyelitis. Due to the large field of view and imaging plane choice, this protocol is less sensitive for assessing small structures such as ligamentous structures of the foot and ankle, compared to a dedicated forefoot or dedicated hindfoot exam. Despite efforts by the technologist and patient, motion  artifact is present on today's exam and could not be eliminated. This reduces exam sensitivity and specificity. Abnormal edema and enhancement involving the distal head and metaphysis of the fifth metatarsal and adjacent proximal half of the small toe proximal phalanx compatible with osteomyelitis. Lateral ulceration along the fifth MTP joint probably communicates with the joint on image 33/6. Extensive subcutaneous edema along the plantar foot at the level of the midfoot and forefoot, although I do not see a drainable abscess this may well be spontaneously draining to the plantar foot given the overlying skin irregularity. There is edema and likely some enhancement along the plantar foot musculature. Thickened proximal plantar fascia. Dorsal subcutaneous edema in the foot. IMPRESSION: 1. Ulceration lateral to the fifth MTP joint appears to extend all the way down into the joint. There is osteomyelitis distally in the fifth metatarsal and also in the proximal phalanx of the fifth toe. No other osteomyelitis in the foot is identified. 2. Suspected ulcerations along the plantar foot with confluent phlegmon tracking in the plantar foot, primarily within the subcutaneous tissues but also along the deep margin of the medial band of the plantar fascia. I suspected any early abscess in this region is spontaneously draining. 3. Cellulitis along the dorsum of the foot. Electronically Signed   By: Van Clines M.D.   On: 11/02/2015 07:31   Ct Cerebral Perfusion W Contrast  Result Date: 11/03/2015 CLINICAL DATA:  Left-sided weakness and aphasia. EXAM: CT ANGIOGRAPHY HEAD AND NECK CT CEREBRAL PERFUSION WITH CONTRAST TECHNIQUE: Multidetector CT imaging of the head and neck was performed using the standard protocol during bolus administration of intravenous contrast. Multiplanar CT image reconstructions and MIPs were obtained to evaluate the vascular anatomy. Carotid stenosis measurements (when applicable) are obtained  utilizing NASCET criteria, using the distal internal carotid diameter as the denominator. CT cerebral perfusion imaging was also performed during the dynamic injection of intravenous contrast. Post processing was performed. CONTRAST:  90 mL Isovue 370 COMPARISON:  Head MRI/ MRA 09/20/2013 FINDINGS: CTA NECK FINDINGS Aortic arch: Three vessel aortic arch with extensive atherosclerotic calcified plaque. There is approximately 60% stenosis of the proximal brachiocephalic artery and proximal right subclavian artery. Atherosclerotic plaque in a focal when or intimal flap in the proximal left subclavian artery contribute to 65% stenosis. Right carotid system: Common carotid artery is patent without stenosis. Internal carotid artery is occluded at its origin without reconstitution in the neck. There is mild narrowing of the ECA origin. Left carotid system: Moderate atherosclerotic plaque at the carotid bifurcation without significant common or internal carotid artery stenosis. Mild proximal ECA stenosis. Vertebral arteries: Vertebral arteries are patent and codominant. There is mild right V2 narrowing at the C2 and C5 levels due to calcified plaque. There is mild left V2 narrowing due to degenerative vertebral spurring at C5-6 and C6-7. Skeleton: Moderate to advanced disc degeneration from C4-5 to C6-7. Other neck: 8 mm calculus in the distal left submandibular duct without significant ductal dilatation or inflammatory change. Small left thyroid nodule. Upper chest: Interlobular septal thickening in the lung apices with partially visualized ground-glass opacity posteriorly in the left upper lobe and superior segment segments  of the left greater than right upper lobes. Review of the MIP images confirms the above findings CTA HEAD FINDINGS Anterior circulation: Chronically occluded right ICA with reconstitution of the terminus via a small but patent right A1 segment, unchanged. Right M1 segment is widely patent. There is at  most mild narrowing of the proximal right M2 inferior division. Decreased number of distal right MCA branch vessels in the right parietal region is chronic. Intracranial left ICA is patent with extensive atherosclerosis though only resulting in at most mild stenosis. 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 Left A1 segment is widely patent, as is the anterior communicating artery. A2 segments are patent. Left MCA is patent without evidence of major branch occlusion or significant proximal stenosis. Posterior circulation: Intracranial vertebral arteries are patent to the basilar with prominent atherosclerosis, greater on the right. There is severe proximal right V4 stenosis with additional moderate tandem stenoses. PICA origins appear patent, potentially with a high-grade stenosis on the right. AICA and SCA origins are patent. Basilar artery is patent without stenosis. Posterior communicating arteries are not identified. PCAs are patent with mild distal left P1 narrowing. No aneurysm. Venous sinuses: Patent. Anatomic variants: Small right A1. Review of the MIP images confirms the above findings CT CEREBRAL PERFUSION FINDINGS There is mildly prolonged transit time throughout most of the right MCA territory, with slightly greater delayed transit (6 seconds greater than the contralateral side) in the deep watershed territory/centrum semiovale. Cerebral blood flow is preserved. Cerebral blood volume is also normal to mildly increased. No diminished CBF/CBV suggestive of acute infarction is seen. IMPRESSION: 1. 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 2. Prolonged transit time throughout the right MCA territory due to #1 with compensated cerebral blood flow parameters. No evidence of acute infarct. 3. Chronically decreased number of distal right MCA branch  vessels. No evidence of acute major branch occlusion. 4. Severe right V4 vertebral artery stenosis. 5. 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 6. Aortic atherosclerosis. 7. 8 mm left submandibular duct calculus. 8. Interlobular septal thickening and ground-glass opacities in lung apices, possibly reflecting edema. Correlate with chest radiography. These results were called by telephone at the time of interpretation on 11/03/2015 at 4:00 pm to Dr. Gifford Shave, who verbally acknowledged these results. Electronically Signed   By: Logan Bores M.D.   On: 11/03/2015 16:31   Dg Chest Port 1 View  Result Date: 11/12/2015 CLINICAL DATA:  Increasing shortness of breath. EXAM: PORTABLE CHEST 1 VIEW COMPARISON:  11/05/2015 FINDINGS: Cardiomegaly noted. Increasing moderate to severe bilateral pulmonary edema noted. A right IJ central venous catheter with tip overlying the lower SVC again noted. There may be trace bilateral pleural effusions present. There is no evidence of pneumothorax. IMPRESSION: Increasing moderate to severe pulmonary edema. Electronically Signed   By: Margarette Canada M.D.   On: 11/12/2015 17:31   Dg Chest Port 1 View  Result Date: 11/05/2015 CLINICAL DATA:  Hypoxia EXAM: PORTABLE CHEST 1 VIEW COMPARISON:  November 03, 2015 FINDINGS: Endotracheal tube and nasogastric tube have been removed. Central catheter tip is in the superior vena cava. No pneumothorax. There is a focal area of consolidation in the right upper lobe medially. There is right base atelectasis. Lungs elsewhere clear. Heart is upper normal in size with pulmonary vascularity within normal limits. There is atherosclerotic calcification in the aorta. No adenopathy. IMPRESSION: Small area of  airspace consolidation in the right upper lobe medially, likely infiltrate. Mild right base atelectasis. Lungs elsewhere clear. Stable cardiac silhouette. Aortic atherosclerosis. No pneumothorax. Electronically Signed   By: Lowella Grip III M.D.   On: 11/05/2015 10:56   Dg Chest Port 1 View  Result Date: 11/03/2015 CLINICAL DATA:  Right central venous line placement EXAM: PORTABLE CHEST 1 VIEW COMPARISON:  08/18/2015 chest radiograph. FINDINGS: Right internal jugular central venous catheter terminates in the upper third of the superior vena cava. Stable cardiomediastinal silhouette with normal heart size with aortic atherosclerosis. No pneumothorax. No pleural effusion. Mild diffuse prominence of the central interstitial markings. No acute consolidative airspace disease. IMPRESSION: 1. Right internal jugular central venous catheter terminates in the upper third of the superior vena cava. No pneumothorax. 2. Top-normal heart size. Prominence of the central interstitial markings, favor vascular crowding due to low lung volumes . 3. Aortic atherosclerosis. Electronically Signed   By: Ilona Sorrel M.D.   On: 11/03/2015 17:31   Dg Chest Port 1v Same Day  Result Date: 11/03/2015 CLINICAL DATA:  Post intubation. EXAM: PORTABLE CHEST 1 VIEW COMPARISON:  11/03/2015 FINDINGS: Patient is status post intubation. Endotracheal tube overlies the tracheal air column and terminates 15 mm above the carina. Retraction with approximately 1-2 cm may be considered. Enteric catheter has been placed, tip collimated off the image. Right IJ approach central venous catheter is stable. Cardiomediastinal silhouette is normal. Mediastinal contours appear intact. Calcific atherosclerotic disease of the aorta seen. There is no evidence of pneumothorax. Low lung volumes with interstitial pulmonary edema. Osseous structures are without acute abnormality. Soft tissues are grossly normal. IMPRESSION: Status post intubation. Slight retraction of the endotracheal tube may be considered. Interstitial pulmonary edema. Electronically Signed   By: Fidela Salisbury M.D.   On: 11/03/2015 19:17   Dg Ang/ext/uni/or Left  Result Date: 11/02/2015 CLINICAL DATA:   65 year old female undergoing distal bypass EXAM: LEFT ANG/EXT/UNI/ OR COMPARISON:  Prior intraoperative angiographic images 8 09/17/2015 FINDINGS: Single cross-table lateral intraoperative image demonstrates what appears to be a patent saphenous vein bypass graft attaching to the distal popliteal artery below the knee joint. The popliteal artery is diffusely diseased. Primary runoff to the ankle is via the peroneal artery. The anterior tibial artery is occluded proximally but reconstitutes distally. The posterior tibial artery is occluded. IMPRESSION: Intraoperative arteriogram as above. Electronically Signed   By: Jacqulynn Cadet M.D.   On: 11/02/2015 14:26   Dg Abd Portable 1v  Result Date: 11/03/2015 CLINICAL DATA:  Orogastric tube placement. EXAM: PORTABLE ABDOMEN - 1 VIEW COMPARISON:  None. FINDINGS: The bowel gas pattern is normal. Enteric catheter is seen overlying the expected location of gastric body. Bilateral iliac vascular stents are seen. Soft tissue details are obscured by a motion artifact. IMPRESSION: Nonobstructive bowel gas pattern. Enteric catheter overlies the expected location of gastric body. Electronically Signed   By: Fidela Salisbury M.D.   On: 11/03/2015 19:20   Dg Foot Complete Left  Result Date: 10/30/2015 CLINICAL DATA:  Dorsal foot pain. Skin lesion with possible wound infection. EXAM: LEFT FOOT - COMPLETE 3+ VIEW COMPARISON:  09/19/2015 FINDINGS: There is definite destructive osteomyelitis of the fifth toe affecting the distal metatarsal an the proximal phalanx. There is probable osteomyelitis of the fourth toe affecting the distal metatarsal an the proximal phalanx. There is questionable osteomyelitis of the proximal phalanx of the third toe. IMPRESSION: Osteomyelitis of the forefoot, definite affecting the fifth toe, probable affecting the fourth toe and possible affecting  the third toe. See above. Electronically Signed   By: Nelson Chimes M.D.   On: 10/30/2015 20:05    Ct Head Code Stroke Wo Contrast  Addendum Date: 11/03/2015   ADDENDUM REPORT: 11/03/2015 15:12 ADDENDUM: These results were called by telephone at the time of interpretation on 11/03/2015 at 3:05 pm to Dr. Gifford Shave, who verbally acknowledged these results. Electronically Signed   By: Logan Bores M.D.   On: 11/03/2015 15:12   Result Date: 11/03/2015 CLINICAL DATA:  Code stroke. Slurred speech with left-sided deficits. EXAM: CT HEAD WITHOUT CONTRAST TECHNIQUE: Contiguous axial images were obtained from the base of the skull through the vertex without intravenous contrast. COMPARISON:  Brain MRI 03/03/2013 and CT 03/02/2013 FINDINGS: Brain: Mild motion artifact. There is no evidence of acute cortical infarct, intracranial hemorrhage, mass, midline shift, or extra-axial fluid collection. Ventricles and sulci are normal in size for age. Patchy cerebral white matter hypodensities are stable to slightly progressive and nonspecific but compatible with mild chronic small vessel ischemic disease. A small chronic left cerebellar infarct is unchanged. Vascular: Calcified atherosclerosis at the skullbase. Skull: No fracture or focal osseous lesion. Sinuses/Orbits: Prior right cataract extraction.  Clear sinuses. Other: None. ASPECTS [facility] Stroke Program Early CT Score) - Ganglionic level infarction (caudate, lentiform nuclei, internal capsule, insula, M1-M3 cortex): 7 - Supraganglionic infarction (M4-M6 cortex): 3 Total score (0-10 with 10 being normal): 10 IMPRESSION: 1. No evidence of acute intracranial abnormality. 2. ASPECTS is 10. 3. Mild chronic small vessel ischemic disease. Chronic left cerebellar infarct. Electronically Signed: By: Logan Bores M.D. On: 11/03/2015 14:48    2-D echo    Subjective: No overnight events. Maintaining O2 sat on room air. Denies shortness of breath.  Discharge Exam: Vitals:   11/14/15 0700 11/14/15 1041  BP: 134/61 (!) 145/65  Pulse: (!) 105 (!) 106  Resp: (!) 26 (!) 25   Temp: 98.1 F (36.7 C) 98.9 F (37.2 C)   Vitals:   11/13/15 2331 11/14/15 0400 11/14/15 0700 11/14/15 1041  BP: (!) 119/47 132/64 134/61 (!) 145/65  Pulse: 74 80 (!) 105 (!) 106  Resp: 18 (!) 22 (!) 26 (!) 25  Temp: 98.8 F (37.1 C) 98.4 F (36.9 C) 98.1 F (36.7 C) 98.9 F (37.2 C)  TempSrc: Oral Oral Oral Oral  SpO2: 99% 95% 96% 97%  Weight:  72.5 kg (159 lb 13.3 oz)    Height:        Gen: not in distress HEENT:   Aller present, moist mucosa, supple neck Chest: Clear to auscultation bilaterally CVS: N S1&S2, no murmurs GI: soft, NT, ND, BS+ Musculoskeletal: warm, wound vac over left foot CNS: Alert and oriented    The results of significant diagnostics from this hospitalization (including imaging, microbiology, ancillary and laboratory) are listed below for reference.     Microbiology: Recent Results (from the past 240 hour(s))  C difficile quick scan w PCR reflex     Status: None   Collection Time: 11/05/15  4:21 PM  Result Value Ref Range Status   C Diff antigen NEGATIVE NEGATIVE Final   C Diff toxin NEGATIVE NEGATIVE Final   C Diff interpretation No C. difficile detected.  Final  Aerobic/Anaerobic Culture (surgical/deep wound)     Status: None (Preliminary result)   Collection Time: 11/10/15  3:07 PM  Result Value Ref Range Status   Specimen Description TISSUE  Final   Special Requests   Final    LEFT FOOT ULCER TISSUE ON SWAB PATIENT  ON FOLLOWING VANC AND MAXIPIME   Gram Stain   Final    ABUNDANT WBC PRESENT, PREDOMINANTLY PMN NO ORGANISMS SEEN    Culture   Final    FEW GROUP B STREP(S.AGALACTIAE)ISOLATED TESTING AGAINST S. AGALACTIAE NOT ROUTINELY PERFORMED DUE TO PREDICTABILITY OF AMP/PEN/VAN SUSCEPTIBILITY. NO ANAEROBES ISOLATED; CULTURE IN PROGRESS FOR 5 DAYS    Report Status PENDING  Incomplete  Aerobic/Anaerobic Culture (surgical/deep wound)     Status: None (Preliminary result)   Collection Time: 11/10/15  3:07 PM  Result Value Ref Range  Status   Specimen Description TISSUE  Final   Special Requests   Final    LEFT FOOT ULCER PATIENT ON FOLLOWING VANC AND MAXIPIME   Gram Stain   Final    ABUNDANT WBC PRESENT, PREDOMINANTLY PMN RARE GRAM POSITIVE COCCI IN PAIRS    Culture   Final    RARE GROUP B STREP(S.AGALACTIAE)ISOLATED TESTING AGAINST S. AGALACTIAE NOT ROUTINELY PERFORMED DUE TO PREDICTABILITY OF AMP/PEN/VAN SUSCEPTIBILITY. NO ANAEROBES ISOLATED; CULTURE IN PROGRESS FOR 5 DAYS    Report Status PENDING  Incomplete  MRSA PCR Screening     Status: None   Collection Time: 11/12/15  7:05 PM  Result Value Ref Range Status   MRSA by PCR NEGATIVE NEGATIVE Final    Comment:        The GeneXpert MRSA Assay (FDA approved for NASAL specimens only), is one component of a comprehensive MRSA colonization surveillance program. It is not intended to diagnose MRSA infection nor to guide or monitor treatment for MRSA infections.      Labs: BNP (last 3 results) No results for input(s): BNP in the last 8760 hours. Basic Metabolic Panel:  Recent Labs Lab 11/08/15 0530 11/09/15 1300 11/12/15 0450 11/13/15 0927  NA 140 138 136 139  K 3.1* 3.9 3.9 3.4*  CL 105 105 106 107  CO2 27 26 25 25   GLUCOSE 51* 167* 157* 111*  BUN 5* 9 15 11   CREATININE 0.41* 0.51 0.54 0.56  CALCIUM 8.5* 8.5* 8.5* 8.8*   Liver Function Tests: No results for input(s): AST, ALT, ALKPHOS, BILITOT, PROT, ALBUMIN in the last 168 hours. No results for input(s): LIPASE, AMYLASE in the last 168 hours. No results for input(s): AMMONIA in the last 168 hours. CBC:  Recent Labs Lab 11/08/15 0530 11/09/15 1300 11/12/15 0450 11/13/15 0927  WBC 11.8* 12.2* 10.3 10.7*  NEUTROABS 8.5*  --   --   --   HGB 8.3* 8.7* 7.1* 9.1*  HCT 25.0* 26.4* 21.8* 27.6*  MCV 85.9 87.1 88.6 86.5  PLT 448* 414* 395 422*   Cardiac Enzymes:  Recent Labs Lab 11/12/15 1845 11/13/15 0533 11/13/15 1516  TROPONINI 0.05* 0.05* 0.05*   BNP: Invalid input(s):  POCBNP CBG:  Recent Labs Lab 11/13/15 0733 11/13/15 1153 11/13/15 1741 11/13/15 2115 11/14/15 0733  GLUCAP 104* 183* 158* 125* 115*   D-Dimer No results for input(s): DDIMER in the last 72 hours. Hgb A1c No results for input(s): HGBA1C in the last 72 hours. Lipid Profile No results for input(s): CHOL, HDL, LDLCALC, TRIG, CHOLHDL, LDLDIRECT in the last 72 hours. Thyroid function studies No results for input(s): TSH, T4TOTAL, T3FREE, THYROIDAB in the last 72 hours.  Invalid input(s): FREET3 Anemia work up  Recent Labs  11/12/15 0830  TIBC 182*  IRON 20*   Urinalysis    Component Value Date/Time   COLORURINE YELLOW 09/05/2015 [place] 09/05/2015 0641   LABSPEC 1.013 09/05/2015 [place]  6.0 09/05/2015 0641   GLUCOSEU 100 (A) 09/05/2015 0641   HGBUR TRACE (A) 09/05/2015 0641   BILIRUBINUR NEGATIVE 09/05/2015 0641   KETONESUR NEGATIVE 09/05/2015 0641   PROTEINUR 100 (A) 09/05/2015 0641   UROBILINOGEN 1.0 10/16/2009 0243   NITRITE NEGATIVE 09/05/2015 0641   LEUKOCYTESUR NEGATIVE 09/05/2015 0641   Sepsis Labs Invalid input(s): PROCALCITONIN,  WBC,  LACTICIDVEN Microbiology Recent Results (from the past 240 hour(s))  C difficile quick scan w PCR reflex     Status: None   Collection Time: 11/05/15  4:21 PM  Result Value Ref Range Status   C Diff antigen NEGATIVE NEGATIVE Final   C Diff toxin NEGATIVE NEGATIVE Final   C Diff interpretation No C. difficile detected.  Final  Aerobic/Anaerobic Culture (surgical/deep wound)     Status: None (Preliminary result)   Collection Time: 11/10/15  3:07 PM  Result Value Ref Range Status   Specimen Description TISSUE  Final   Special Requests   Final    LEFT FOOT ULCER TISSUE ON SWAB PATIENT ON FOLLOWING VANC AND MAXIPIME   Gram Stain   Final    ABUNDANT WBC PRESENT, PREDOMINANTLY PMN NO ORGANISMS SEEN    Culture   Final    FEW GROUP B STREP(S.AGALACTIAE)ISOLATED TESTING AGAINST S. AGALACTIAE NOT  ROUTINELY PERFORMED DUE TO PREDICTABILITY OF AMP/PEN/VAN SUSCEPTIBILITY. NO ANAEROBES ISOLATED; CULTURE IN PROGRESS FOR 5 DAYS    Report Status PENDING  Incomplete  Aerobic/Anaerobic Culture (surgical/deep wound)     Status: None (Preliminary result)   Collection Time: 11/10/15  3:07 PM  Result Value Ref Range Status   Specimen Description TISSUE  Final   Special Requests   Final    LEFT FOOT ULCER PATIENT ON FOLLOWING VANC AND MAXIPIME   Gram Stain   Final    ABUNDANT WBC PRESENT, PREDOMINANTLY PMN RARE GRAM POSITIVE COCCI IN PAIRS    Culture   Final    RARE GROUP B STREP(S.AGALACTIAE)ISOLATED TESTING AGAINST S. AGALACTIAE NOT ROUTINELY PERFORMED DUE TO PREDICTABILITY OF AMP/PEN/VAN SUSCEPTIBILITY. NO ANAEROBES ISOLATED; CULTURE IN PROGRESS FOR 5 DAYS    Report Status PENDING  Incomplete  MRSA PCR Screening     Status: None   Collection Time: 11/12/15  7:05 PM  Result Value Ref Range Status   MRSA by PCR NEGATIVE NEGATIVE Final    Comment:        The GeneXpert MRSA Assay (FDA approved for NASAL specimens only), is one component of a comprehensive MRSA colonization surveillance program. It is not intended to diagnose MRSA infection nor to guide or monitor treatment for MRSA infections.      Time coordinating discharge: Over 30 minutes  SIGNED:   Louellen Molder, MD  [facility] 11/14/2015, 11:57 AM Pager   If 7PM-7AM, please contact night-coverage [email or web address] Password [email or web address]

## 2015-11-14 NOTE — Progress Notes (Addendum)
[facility] for Infectious Disease    Date of Admission:  10/30/2015   Total days of antibiotics 15        Day 2 ceftriaxone        Day 3 fluc           ID: [name] [name] is a 65 y.o. female with Poorly controlled diabetes mellitus with peripheral vascular disease diabetic foot ulcers arterial disease and osteomyelitis of third fourth and fifth digits sp Bypass surgery and local debridement c/b respiratory distress and intubation. OR CX growing Group B strep  Principal Problem:   Osteomyelitis of left foot (HCC) Active Problems:   Diabetes mellitus type II, uncontrolled (HCC)   Hyperlipidemia with target LDL less than 70   PVD (peripheral vascular disease) with claudication (HCC)   Essential hypertension   Malnutrition of moderate degree   Diabetic foot ulcer (HCC)   S/P femoral-popliteal bypass surgery   Septic shock (HCC)   Encephalopathy acute   Acute blood loss anemia   Acute respiratory failure with hypoxia (HCC)   CVA (cerebral vascular accident) ([place])   Central line infection, initial encounter   Demand myocardial infarction   Surgery, elective   Traumatic hemorrhagic shock ([place])   Chronic systolic CHF (congestive heart failure) ([place])   Uncontrolled type 2 diabetes mellitus with complication (HCC)    Subjective: Afebrile, no difficulty with wound vac on her left foot  Medications:  . aspirin EC  81 mg Oral Daily  . atorvastatin  80 mg Oral Daily  . cefTRIAXone (ROCEPHIN)  IV  2 g Intravenous Q24H  . clopidogrel  75 mg Oral Daily  . diphenhydrAMINE  50 mg Oral Once  . feeding supplement (PRO-STAT SUGAR FREE 64)  30 mL Oral BID  . fluconazole  100 mg Oral Daily  . gabapentin  300 mg Oral TID  . insulin aspart  0-15 Units Subcutaneous TID WC  . insulin glargine  35 Units Subcutaneous Q2200  . metoprolol tartrate  50 mg Oral BID  . pantoprazole  40 mg Oral Q1200  . polyethylene glycol  17 g Oral Daily  . senna-docusate  1 tablet Oral QHS     Objective: Vital signs in last 24 hours: Temp:  [98.1 F (36.7 C)-98.8 F (37.1 C)] 98.1 F (36.7 C) (10/17 0700) Pulse Rate:  [74-105] 105 (10/17 0700) Resp:  [18-26] 26 (10/17 0700) BP: (119-155)/(47-79) 134/61 (10/17 0700) SpO2:  [95 %-99 %] 96 % (10/17 0700) Weight:  [159 lb 13.3 oz (72.5 kg)] 159 lb 13.3 oz (72.5 kg) (10/17 0400) Physical Exam  Constitutional:  oriented to person, place, and time. appears well-developed and well-nourished. No distress.  HENT: [place]/AT, PERRLA, no scleral icterus Neck: left IJ in place Mouth/Throat: Oropharynx is clear and moist. No oropharyngeal exudate.  Cardiovascular: Normal rate, regular rhythm and normal heart sounds. Exam reveals no gallop and no friction rub.  No murmur heard.  Pulmonary/Chest: Effort normal and breath sounds normal. No respiratory distress.  has no wheezes.  Neck = supple, no nuchal rigidity Abdominal: Soft. Bowel sounds are normal.  exhibits no distension. There is no tenderness.  Lymphadenopathy: no cervical adenopathy. No axillary adenopathy Neurological: alert and oriented to person, place, and time.  Skin: left foot has wound vac in place as well as left pressure ulcer on lateral aspect of foot by 5th digit Psychiatric: a normal mood and affect.  behavior is normal.   Lab Results  Recent Labs  11/12/15 0450 11/13/15 0927  WBC 10.3 10.7*  HGB 7.1* 9.1*  HCT 21.8* 27.6*  NA 136 139  K 3.9 3.4*  CL 106 107  CO2 25 25  BUN 15 11  CREATININE 0.54 0.56   Lab Results  Component Value Date   ESRSEDRATE 135 (H) 11/02/2015    Microbiology: 10/13 rare group b strep Studies/Results: Dg Chest Port 1 View  Result Date: 11/12/2015 CLINICAL DATA:  Increasing shortness of breath. EXAM: PORTABLE CHEST 1 VIEW COMPARISON:  11/05/2015 FINDINGS: Cardiomegaly noted. Increasing moderate to severe bilateral pulmonary edema noted. A right IJ central venous catheter with tip overlying the lower SVC again noted. There  may be trace bilateral pleural effusions present. There is no evidence of pneumothorax. IMPRESSION: Increasing moderate to severe pulmonary edema. Electronically Signed   By: Margarette Canada M.D.   On: 11/12/2015 17:31     Assessment/Plan: Diabetic foot ulcer c/b osteo s/p debridement and revascularization  = cultures grew group b strep, will recommend to narrow abtx to ceftriaxone 2gm IV daily x 6 wk as of 10/14 for treatment of osteo.  PVD s/p vascular surgery = likely to improve healing process of left foot osteo. Recommend to follow up with vascular surgery  -agree with plan to d/c right IJ CL and place picc line  Will have pt follow up in the id clinic. Will defer to wound care to ortho/vascular recs  ------------------------------------  Home health orders listed below  Diagnosis: Diabetic osteo  Culture Result: group b strep  Allergies  Allergen Reactions  . Hydrochlorothiazide Other (See Comments)    Unknown  . Latex Rash  . Penicillins Swelling and Rash    Pt states she has tolerated Keflex in the past without problems. States she may have tolerated Augmentin in the past but it caused GI upset.    Discharge antibiotics: Per pharmacy protocol  Ceftriaxone 2gm iv daily  Duration: 6 wk End Date: Nov 26th  [place] Per Protocol:  Labs weekly while on IV antibiotics: _x_ CBC with differential _x_ BMP __ CMP _x_ CRP _x_ ESR   _x_ Please pull PIC at completion of IV antibiotics __ Please leave PIC in place until doctor has seen patient or been notified  Fax weekly labs to [phone]  Clinic Follow Up Appt: 4-6 wk  @ [place], [facility] for Infectious Diseases Cell: [phone] Pager: [phone]  11/14/2015, 8:55 AM

## 2015-11-14 NOTE — Progress Notes (Signed)
VASCULAR SURGERY  For discharge today. I will arrange f/u of her bypass graft in 3 weeks.  Waverly Ferrari, MD, FACS [facility] [phone] Office: [phone]

## 2015-11-14 NOTE — Progress Notes (Signed)
Peripherally Inserted Central Catheter/Midline Placement  The IV Nurse has discussed with the patient and/or persons authorized to consent for the patient, the purpose of this procedure and the potential benefits and risks involved with this procedure.  The benefits include less needle sticks, lab draws from the catheter, and the patient may be discharged home with the catheter. Risks include, but not limited to, infection, bleeding, blood clot (thrombus formation), and puncture of an artery; nerve damage and irregular heartbeat and possibility to perform a PICC exchange if needed/ordered by physician.  Alternatives to this procedure were also discussed.  Bard Power PICC patient education guide, fact sheet on infection prevention and patient information card has been provided to patient /or left at bedside.    PICC/Midline Placement Documentation  PICC Single Lumen 11/14/15 PICC Right Basilic 40 cm 1 cm (Active)  Indication for Insertion or Continuance of Line Home intravenous therapies (PICC only) 11/14/2015 11:00 AM  Exposed Catheter (cm) 1 cm 11/14/2015 11:00 AM  Dressing Change Due 11/21/15 11/14/2015 11:00 AM       Franne Grip Renee 11/14/2015, 11:37 AM

## 2015-11-15 ENCOUNTER — Non-Acute Institutional Stay (SKILLED_NURSING_FACILITY): Payer: Medicare Other | Admitting: Internal Medicine

## 2015-11-15 ENCOUNTER — Telehealth: Payer: Self-pay | Admitting: Vascular Surgery

## 2015-11-15 ENCOUNTER — Encounter: Payer: Medicare Other | Admitting: Vascular Surgery

## 2015-11-15 ENCOUNTER — Encounter: Payer: Self-pay | Admitting: Internal Medicine

## 2015-11-15 DIAGNOSIS — Z95828 Presence of other vascular implants and grafts: Secondary | ICD-10-CM

## 2015-11-15 DIAGNOSIS — A419 Sepsis, unspecified organism: Secondary | ICD-10-CM | POA: Diagnosis not present

## 2015-11-15 DIAGNOSIS — J9601 Acute respiratory failure with hypoxia: Secondary | ICD-10-CM

## 2015-11-15 DIAGNOSIS — L97423 Non-pressure chronic ulcer of left heel and midfoot with necrosis of muscle: Secondary | ICD-10-CM

## 2015-11-15 DIAGNOSIS — IMO0002 Reserved for concepts with insufficient information to code with codable children: Secondary | ICD-10-CM

## 2015-11-15 DIAGNOSIS — I110 Hypertensive heart disease with heart failure: Secondary | ICD-10-CM | POA: Diagnosis not present

## 2015-11-15 DIAGNOSIS — T794XXD Traumatic shock, subsequent encounter: Secondary | ICD-10-CM

## 2015-11-15 DIAGNOSIS — I5032 Chronic diastolic (congestive) heart failure: Secondary | ICD-10-CM

## 2015-11-15 DIAGNOSIS — R6521 Severe sepsis with septic shock: Secondary | ICD-10-CM

## 2015-11-15 DIAGNOSIS — I739 Peripheral vascular disease, unspecified: Secondary | ICD-10-CM

## 2015-11-15 DIAGNOSIS — E08621 Diabetes mellitus due to underlying condition with foot ulcer: Secondary | ICD-10-CM | POA: Diagnosis not present

## 2015-11-15 DIAGNOSIS — E785 Hyperlipidemia, unspecified: Secondary | ICD-10-CM

## 2015-11-15 DIAGNOSIS — I214 Non-ST elevation (NSTEMI) myocardial infarction: Secondary | ICD-10-CM

## 2015-11-15 DIAGNOSIS — M86172 Other acute osteomyelitis, left ankle and foot: Secondary | ICD-10-CM

## 2015-11-15 DIAGNOSIS — E1142 Type 2 diabetes mellitus with diabetic polyneuropathy: Secondary | ICD-10-CM

## 2015-11-15 DIAGNOSIS — E118 Type 2 diabetes mellitus with unspecified complications: Secondary | ICD-10-CM | POA: Diagnosis not present

## 2015-11-15 DIAGNOSIS — Z794 Long term (current) use of insulin: Secondary | ICD-10-CM

## 2015-11-15 DIAGNOSIS — I5022 Chronic systolic (congestive) heart failure: Secondary | ICD-10-CM | POA: Diagnosis not present

## 2015-11-15 DIAGNOSIS — E1165 Type 2 diabetes mellitus with hyperglycemia: Secondary | ICD-10-CM

## 2015-11-15 LAB — AEROBIC/ANAEROBIC CULTURE (SURGICAL/DEEP WOUND)

## 2015-11-15 LAB — AEROBIC/ANAEROBIC CULTURE W GRAM STAIN (SURGICAL/DEEP WOUND)

## 2015-11-15 NOTE — Progress Notes (Signed)
: Provider:  Randon GoldsmithAnne D. Lyn HollingsheadAlexander, MD [facility]  [facility] Room Number: 109P Place of Service:  SNF (31)  PCP: Georgann HousekeeperHUSAIN,KARRAR, MD Patient Care Team: Georgann HousekeeperKarrar Husain, MD as PCP - General (Internal Medicine) Runell GessJonathan J Berry, MD as Consulting Physician (Cardiology)  Extended Emergency Contact Information Primary Emergency Contact: Eugenio HoesLamb,[name]  [place] Mobile Phone: [phone] Relation: Daughter     Allergies: Hydrochlorothiazide; Latex; and Penicillins  Chief Complaint  Patient presents with  . New Admit To SNF    Admit to Facility    HPI: Patient is 65 y.o. female with diabetes mellitus, peripheral vascular disease status post revascularization, history of MI, hypertension, hyperlipidemia, peripheral neuropathy and  left foot osteomyelitis was [facility]17 where she underwent a fem-pop. Before she the foot could be surgically debrided pt presented with stroke symptoms and went into shock. Hb was found to be 5.8 2/2 bleeding into her thigh. She developed an NSTEMI as well. When she was transfused she went into pulmonary edema which resolved with diuresis. Pt finallu underwent debridement of foot on 10/13. Pt is admitted to SNF for IV rocephin for 6 weeks (stop 12/24/2015. While at SNF pt will be followed for HLD, tx with lipitor, peripheral neuropathy, tx with neurontin and HTN, tx with norvasc, lasox, losartan and metoprolol.  Past Medical History:  Diagnosis Date  . Arthritis    "feel like I have it all over" (08/28/2015)  . Complication of anesthesia    DIFFICULT WAKING "only when I was smoking; no problems since I quit"  . Coronary artery disease   . Family history of adverse reaction to anesthesia    sister slow to wake up  . GERD (gastroesophageal reflux disease)    takes Protonix daily   . Hip bursitis   . Hyperlipidemia LDL goal < 70 06/28/2013   takes Atorvastatin daily  . Hypertension    takes Metoprolol  and Imdur daily  . Migraine    "none in a long time" (08/28/2015)  . Myocardial infarction 2011  . Neuromuscular disorder (HCC)    DIABETIC NEUROPATHY  . PAD (peripheral artery disease) (HCC)   . Peripheral vascular disease (HCC)   . Type II diabetes mellitus (HCC)    takes Lantus nightly.Average fasting blood sugar runs 80-90    Past Surgical History:  Procedure Laterality Date  . ABDOMINAL HYSTERECTOMY    . APPENDECTOMY    . ATHERECTOMY N/A 06/04/2011   Procedure: ATHERECTOMY;  Surgeon: Runell GessJonathan J Berry, MD;  [facility];  Service: Cardiovascular;  Laterality: N/A;  . CARDIAC CATHETERIZATION  10/13/2009   95% stenosis in the AV groove circumflex and 95% ostial stenosis in small OM3. A 3x5928mm drug-eluting Promus stent inserted ito the circumflex. Dilatated with a 3.25x5512mm noncompliant Quantum balloon within entire segment. The entire region was reduced to 0% and brisk TIMI3 flow.  . CAROTID DUPLEX  03/19/2011   Right ICA-demonstrates complete occlusion. Left ICA-demonstrates a small amount of fibrous plaque.  Marland Kitchen. CATARACT EXTRACTION W/ INTRAOCULAR LENS IMPLANT Right   . CESAREAN SECTION  1990  . CORONARY ANGIOPLASTY    . ENDARTERECTOMY FEMORAL Left 09/05/2015   Procedure: ENDARTERECTOMY FEMORAL WITH PROFUNDOPLASTY;  Surgeon: Chuck Hinthristopher S Dickson, MD;  [facility];  Service: Vascular;  Laterality: Left;  Left common femoral artery vein patch using left saphenous vien  . FEMORAL-POPLITEAL BYPASS GRAFT Left 11/02/2015   Procedure: BYPASS GRAFT FEMORAL-POPLITEAL ARTERY VS FEMORAL-TIBIAL ARTERY BYPASS;  Surgeon: Cristal Deerhristopher  Adele Dan, MD;  Location: [facility] OR;  Service: Vascular;  Laterality: Left;  . I&D EXTREMITY Left 11/10/2015   Procedure: Debridement Left Foot Ulcer, Application  Wound VAC;  Surgeon: [name], MD;  Location: [facility] OR;  Service: Orthopedics;  Laterality: Left;  . ILIAC ARTERY STENT Left 08/28/2015   common  . INTRAOPERATIVE ARTERIOGRAM Left 09/05/2015   Procedure:  INTRA OPERATIVE ARTERIOGRAM;  Surgeon: Chuck Hint, MD;  Location: [facility] OR;  Service: Vascular;  Laterality: Left;  . INTRAOPERATIVE ARTERIOGRAM Left 11/02/2015   Procedure: INTRA OPERATIVE ARTERIOGRAM;  Surgeon: Chuck Hint, MD;  Location: [facility] OR;  Service: Vascular;  Laterality: Left;  . LEXISCAN MYOVIEW  10/25/2010   Moderate perfusion defect due to infarct/scar with mild perinfarct ischemia seen in the Basal Inferolateral, Basal Anterolateral, Mid Inferolateral, and Mid Anterolateral regions. Post-stress EF is 50%.  . OVARY SURGERY  1983?   "ruptured"  . PERIPHERAL VASCULAR ANGIOGRAM  01/26/2010   High-grade SFA disease: left greater than right. Left SFA would require fem-pop bypass grafting. Right SFA could be stented but might require Diamondback Orbital atherectomy.  Marland Kitchen PERIPHERAL VASCULAR ANGIOGRAM  02/23/2010   Stealth Predator orbital rotational atherectomy performed on SFA & Popliteal up to 90,000 RPM. Stenting using overlapping 5x135mm and 5x63mm Absolute Pro Nitinol self-expanding stents beginning just at the knee up to the mid SFA resulting in reduction of 90-95% calcified SFA & Popliteal stenosis to 0. Stenting performed on the distal common & proximal iliac artery with a 10x4 Absolute Pro- 70-0%.  Marland Kitchen PERIPHERAL VASCULAR ANGIOGRAM  06/17/2010   PTA performed to the right external iliac artery stent using a 5x100 balloon at 10 atmospheres. Stenting performed using a 6x18 Genesis on Opta balloon. Postdilatation with a 7x2 balloon resulting in a 95% "in-stent" stenosis to 0% residual.  . PERIPHERAL VASCULAR ANGIOGRAM  06/04/2011   Bilateral total SFAs not percutaneously addressable. Good canidate for femoropopliteal bypass grafting  . PERIPHERAL VASCULAR ANGIOGRAM  08/28/2015  . PERIPHERAL VASCULAR CATHETERIZATION N/A 08/28/2015   Procedure: Lower Extremity Angiography;  Surgeon: Runell Gess, MD;  Location: [facility] INVASIVE CV LAB;  Service: Cardiovascular;  Laterality: N/A;  .  PERIPHERAL VASCULAR CATHETERIZATION N/A 08/28/2015   Procedure: Abdominal Aortogram;  Surgeon: Runell Gess, MD;  Location: [facility] INVASIVE CV LAB;  Service: Cardiovascular;  Laterality: N/A;  . PERIPHERAL VASCULAR CATHETERIZATION Left 08/28/2015   Procedure: Peripheral Vascular Intervention;  Surgeon: Runell Gess, MD;  Location: [facility] INVASIVE CV LAB;  Service: Cardiovascular;  Laterality: Left;  common iliac  . PERIPHERAL VASCULAR CATHETERIZATION Left 08/28/2015   Procedure: Peripheral Vascular Atherectomy;  Surgeon: Runell Gess, MD;  Location[facility] INVASIVE CV LAB;  Service: Cardiovascular;  Laterality: Left;  common iliac  . PERIPHERAL VASCULAR CATHETERIZATION N/A 09/28/2015   Procedure: Lower Extremity Angiography;  Surgeon: Runell Gess, MD;  Location: [facility] INVASIVE CV LAB;  Service: Cardiovascular;  Laterality: N/A;  . PERIPHERAL VASCULAR CATHETERIZATION Left 09/28/2015   Procedure: Peripheral Vascular Intervention;  Surgeon: Runell Gess, MD;  Location: [facility] INVASIVE CV LAB;  Service: Cardiovascular;  Laterality: Left CFA  PCI with 9 mm x 4 cm Abbott nitinol absolute Pro self-expanding stent     . TRANSTHORACIC ECHOCARDIOGRAM  10/17/2009   EF 45-50%, moderate hypokinesis of the entire inferolateral myocardium, mild concentric hypertrophy and mild regurg of the mitral valva.  Marland Kitchen VEIN HARVEST Left 11/02/2015   Procedure: LEFT GREATER SAPHENOUS VEIN HARVEST;  Surgeon: Chuck Hint, MD;  Location: [facility] OR;  Service: Vascular;  Laterality: Left;      Medication List       Accurate as of 11/15/15  9:41 AM. Always use your most recent med list.          amLODipine 5 MG tablet Commonly known as:  NORVASC TAKE 1 TABLET(5 MG) BY MOUTH DAILY   aspirin EC 81 MG tablet Take 81 mg by mouth daily.   atorvastatin 80 MG tablet Commonly known as:  LIPITOR Take 1 tablet (80 mg total) by mouth daily.   cefTRIAXone 2 g in dextrose 5 % 50 mL Inject 2 g into the vein daily.     clopidogrel 75 MG tablet Commonly known as:  PLAVIX Take 1 tablet (75 mg total) by mouth daily. Holding for vascular surgery, restart as soon as possible after leg surgery (after 8/8)   collagenase ointment Commonly known as:  SANTYL Apply 1 application topically daily.   collagenase ointment Commonly known as:  SANTYL Apply topically daily.   feeding supplement (PRO-STAT SUGAR FREE 64) Liqd Take 30 mLs by mouth 2 (two) times daily.   fluconazole 100 MG tablet Commonly known as:  DIFLUCAN Take 1 tablet (100 mg total) by mouth daily.   furosemide 20 MG tablet Commonly known as:  LASIX Take 1 tablet (20 mg total) by mouth daily as needed for fluid or edema (shortness of breath).   gabapentin 300 MG capsule Commonly known as:  NEURONTIN Take 300 mg by mouth 3 (three) times daily.   Insulin Glargine 100 UNIT/ML Solostar Pen Commonly known as:  LANTUS Inject 35 Units into the skin daily at 10 pm.   isosorbide mononitrate 60 MG 24 hr tablet Commonly known as:  IMDUR Take 90 mg by mouth daily.   ketoconazole 2 % cream Commonly known as:  NIZORAL Apply 1 application topically daily.   losartan 100 MG tablet Commonly known as:  COZAAR TAKE 1 TABLET BY MOUTH EVERY DAY   metFORMIN 1000 MG tablet Commonly known as:  GLUCOPHAGE Take 1,000 mg by mouth 2 (two) times daily with a meal.   metoprolol succinate 50 MG 24 hr tablet Commonly known as:  TOPROL-XL Take 1 tablet (50 mg total) by mouth daily.   OVER THE COUNTER MEDICATION Apply 1 application topically daily. [place]   oxyCODONE-acetaminophen 5-325 MG tablet Commonly known as:  PERCOCET/ROXICET Take 2 tablets by mouth every 6 (six) hours as needed for moderate pain.   pantoprazole 40 MG tablet Commonly known as:  PROTONIX Take 1 tablet (40 mg total) by mouth daily.   polyethylene glycol packet Commonly known as:  MIRALAX / GLYCOLAX Take 17 g by mouth daily as needed.       No orders of the defined types  were placed in this encounter.   There is no immunization history for the selected administration types on file for this patient.  Social History  Substance Use Topics  . Smoking status: Former Smoker    Packs/day: 1.50    Years: 41.00    Types: Cigarettes    Quit date: 10/12/2009  . Smokeless tobacco: Never Used  . Alcohol use No    Family history is   Family History  Problem Relation Age of Onset  . Hypertension Mother   . Heart failure Mother   . Heart failure Father   . Stroke Father   . Diabetes Father       Review of Systems  DATA OBTAINED: from patient, nurse GENERAL:  no fevers, fatigue, appetite  changes SKIN: No itching, or rash EYES: No eye pain, redness, discharge EARS: No earache, tinnitus, change in hearing NOSE: No congestion, drainage or bleeding  MOUTH/THROAT: No mouth or tooth pain, No sore throat RESPIRATORY: No cough, wheezing, SOB CARDIAC: No chest pain, palpitations, lower extremity edema  GI: No abdominal pain, No N/V/D or constipation, No heartburn or reflux  GU: No dysuria, frequency or urgency, or incontinence  MUSCULOSKELETAL: No unrelieved bone/joint pain NEUROLOGIC: No headache, dizziness or focal weakness PSYCHIATRIC: No c/o anxiety or sadness   Vitals:   11/15/15 0905  BP: (!) 163/68  Pulse: 84  Resp: 18  Temp: 98.1 F (36.7 C)    SpO2 Readings from Last 1 Encounters:  11/15/15 93%   Body mass index is 27.25 kg/m.     Physical Exam  GENERAL APPEARANCE: Alert, conversant,  No acute distress.  SKIN: No diaphoresis rash; incisions look clean,wound vac HEAD: Normocephalic, atraumatic  EYES: Conjunctiva/lids clear. Pupils round, reactive. EOMs intact.  EARS: External exam WNL, canals clear. Hearing grossly normal.  NOSE: No deformity or discharge.  MOUTH/THROAT: Lips w/o lesions  RESPIRATORY: Breathing is even, unlabored. Lung sounds are clear   CARDIOVASCULAR: Heart RRR no murmurs, rubs or gallops. No peripheral edema.    GASTROINTESTINAL: Abdomen is soft, non-tender, not distended w/ normal bowel sounds. GENITOURINARY: Bladder non tender, not distended  MUSCULOSKELETAL: No abnormal joints or musculature NEUROLOGIC:  Cranial nerves 2-12 grossly intact. Moves all extremities  PSYCHIATRIC: Mood and affect appropriate to situation, no behavioral issues  Patient Active Problem List   Diagnosis Date Noted  . Traumatic hemorrhagic shock (HCC)   . Chronic systolic CHF (congestive heart failure) (HCC)   . Uncontrolled type 2 diabetes mellitus with complication (HCC)   . Demand myocardial infarction 11/08/2015  . Surgery, elective   . Central line infection, initial encounter   . Septic shock (HCC) 11/03/2015  . Encephalopathy acute 11/03/2015  . Acute blood loss anemia 11/03/2015  . Acute respiratory failure with hypoxia (HCC)   . CVA (cerebral vascular accident) (HCC)   . S/P femoral-popliteal bypass surgery   . Malnutrition of moderate degree 11/01/2015  . Diabetic foot ulcer (HCC)   . Essential hypertension 10/31/2015  . Osteomyelitis of left foot (HCC) 10/30/2015  . PVD (peripheral vascular disease) with claudication (HCC) 09/28/2015  . Critical lower limb ischemia 08/28/2015  . Hyperlipidemia with target LDL less than 70 06/28/2013  . Diabetes mellitus type II, uncontrolled (HCC) 03/03/2013  . Complicated migraine 03/03/2013  . Stroke-like symptom 03/02/2013  . Diabetes mellitus (HCC) 03/02/2013  . Aphasia 03/02/2013  . Headache(784.0) 03/02/2013  . PAD (peripheral artery disease) (HCC) 06/05/2011  . Coronary artery disease involving native coronary artery of native heart without angina pectoris 06/05/2011  . Hypotension 06/05/2011  . Claudication in peripheral vascular disease:  Lifestyle limiting. 06/05/2011  . Hx of tobacco use, presenting hazards to health 06/05/2011      Labs reviewed: Basic Metabolic Panel:    Component Value Date/Time   NA 139 11/13/2015 0927   NA 139 11/13/2015    K 3.4 (L) 11/13/2015 0927   CL 107 11/13/2015 0927   CO2 25 11/13/2015 0927   GLUCOSE 111 (H) 11/13/2015 0927   BUN 11 11/13/2015 0927   BUN 11 11/13/2015   CREATININE 0.56 11/13/2015 0927   CREATININE 0.58 09/19/2015 1305   CALCIUM 8.8 (L) 11/13/2015 0927   PROT 5.6 (L) 11/07/2015 0447   ALBUMIN 1.5 (L) 11/07/2015 0447   AST 22 11/07/2015 0447  ALT 15 11/07/2015 0447   ALKPHOS 85 11/07/2015 0447   BILITOT 0.6 11/07/2015 0447   GFRNONAA >60 11/13/2015 0927   GFRAA >60 11/13/2015 0927     Recent Labs  11/02/15 2202  11/09/15 1300 11/12/15 11/12/15 0450 11/13/15 11/13/15 0927  NA  --   < > 138 136* 136 139 139  K  --   < > 3.9 3.9 3.9  --  3.4*  CL  --   < > 105  --  106  --  107  CO2  --   < > 26  --  25  --  25  GLUCOSE  --   < > 167*  --  157*  --  111*  BUN  --   < > 9 15 15 11 11   CREATININE  --   < > 0.51 0.5 0.54 0.6 0.56  CALCIUM  --   < > 8.5*  --  8.5*  --  8.8*  MG 1.2*  --   --   --   --   --   --   < > = values in this interval not displayed. Liver Function Tests:  Recent Labs  11/03/15 1612 11/05/15 0458 11/07/15 0447  AST 31 27 22   ALT 13* 12* 15  ALKPHOS 85 74 85  BILITOT 0.7 0.4 0.6  PROT 4.8* 5.0* 5.6*  ALBUMIN 1.6* 1.4* 1.5*   No results for input(s): LIPASE, AMYLASE in the last 8760 hours. No results for input(s): AMMONIA in the last 8760 hours. CBC:  Recent Labs  09/19/15 1305  10/30/15 1930  11/08/15 0530  11/09/15 1300 11/12/15 11/12/15 0450 11/13/15 11/13/15 0927  WBC 9.2  < > 13.3*  < > 11.8*  < > 12.2* 10.3 10.3 10.7 10.7*  NEUTROABS 5,612  --  10.0*  --  8.5*  --   --   --   --   --   --   HGB 11.6*  < > 10.5*  < > 8.3*  < > 8.7* 7.1* 7.1*  --  9.1*  HCT 33.7*  < > 31.2*  < > 25.0*  < > 26.4* 22* 21.8*  --  27.6*  MCV 85.3  < > 85.0  < > 85.9  --  87.1  --  88.6  --  86.5  PLT 417*  < > 445*  < > 448*  < > 414* 395 395  --  422*  < > = values in this interval not displayed. Lipid  Recent Labs  11/05/15 0458  CHOL 91    HDL 25*  LDLCALC 44  TRIG 060    Cardiac Enzymes:  Recent Labs  11/03/15 1612  11/12/15 1845 11/13/15 0533 11/13/15 1516  CKTOTAL 604*  --   --   --   --   TROPONINI 0.14*  < > 0.05* 0.05* 0.05*  < > = values in this interval not displayed. BNP: No results for input(s): BNP in the last 8760 hours. No results found for: [facility] Lab Results  Component Value Date   HGBA1C 7.6 (H) 11/02/2015   Lab Results  Component Value Date   TSH 1.05 08/24/2015   No results found for: VITAMINB12 No results found for: FOLATE Lab Results  Component Value Date   IRON 20 (L) 11/12/2015   TIBC 182 (L) 11/12/2015    Imaging and Procedures obtained prior to SNF admission: Dg Foot Complete Left  Result Date: 10/30/2015 CLINICAL DATA:  Dorsal foot pain.  Skin lesion with possible wound infection. EXAM: LEFT FOOT - COMPLETE 3+ VIEW COMPARISON:  09/19/2015 FINDINGS: There is definite destructive osteomyelitis of the fifth toe affecting the distal metatarsal an the proximal phalanx. There is probable osteomyelitis of the fourth toe affecting the distal metatarsal an the proximal phalanx. There is questionable osteomyelitis of the proximal phalanx of the third toe. IMPRESSION: Osteomyelitis of the forefoot, definite affecting the fifth toe, probable affecting the fourth toe and possible affecting the third toe. See above. Electronically Signed   By: Paulina Fusi M.D.   On: 10/30/2015 20:05     Not all labs, radiology exams or other studies done during hospitalization come through on my EPIC note; however they are reviewed by me.    Assessment and Plan  OSTEOMYELITIS L FOOT/ PVD - s/p fem pop and debridement of foot; pt to be on IV Rocephin for 6 weeks (stop date 11/26) SNF - IV rocephin for 6 weeks; OT/PT  ACUTE HYPOXIC RESP FAILURE/HEMORRHAGIC SHOCK/SEPTIC SHOCK/NSTEMI -  All due to hemorrhage into thigh with resultant Hb 5.8;required intubation and was extubated within 24 hrs; transfused I  u PRBC SNF - will f;u CBC  CHRONIC CHF/NSTEMI- Pt went into pulm edema 2/2 transfusion;tx with bipap and diuresis and resolved; ECHO with gade I dyastolic dys and normalEF SNF - cont ASA, plavix, bblocker, statin, lasix  POSSIBLE CVA - CT head neg, no focal neuro deficits SNF - cont plavix  DM2- with neuropathy and PVD;A1c 7.0 SNF - pt to be on metformin 1000mg  BID and lantus 35 u qHS  HLD SNF - cnt lipitor 80 mg daily  HTN SNF - cont norvasc 5 mg, lasix 20 mg, losartan 100 mg QOD,toprol XL 50 mg daily    Time spent > 45 min;> 50% of time with patient was spent reviewing records, labs, tests and studies, counseling and developing plan of care  Thurston Hole D. Lyn Hollingshead, MD

## 2015-11-15 NOTE — Telephone Encounter (Signed)
Spoke to daughter pt coming 11/8

## 2015-11-15 NOTE — Telephone Encounter (Signed)
-----   Message from Sharee Pimple, RN sent at 11/14/2015 12:55 PM EDT ----- Regarding: FW: f/u   3 weeks   ----- Message ----- From: Chuck Hint, MD Sent: 11/14/2015  12:07 PM To: [facility] Charge Pool Subject: f/u                                            She is being discharge today to rehab closer to home.  She will need a f/u visit in 3 weeks Thanks CD

## 2015-11-18 LAB — BASIC METABOLIC PANEL
BUN: 13 mg/dL (ref 4–21)
Creatinine: 1 mg/dL (ref 0.5–1.1)
Glucose: 78 mg/dL
Potassium: 3.6 mmol/L (ref 3.4–5.3)
SODIUM: 143 mmol/L (ref 137–147)

## 2015-11-18 LAB — CBC AND DIFFERENTIAL
HEMATOCRIT: 31 % — AB (ref 36–46)
HEMOGLOBIN: 10 g/dL — AB (ref 12.0–16.0)
PLATELETS: 365 10*3/uL (ref 150–399)
WBC: 7.4 10^3/mL

## 2015-11-19 ENCOUNTER — Encounter: Payer: Self-pay | Admitting: Internal Medicine

## 2015-11-19 DIAGNOSIS — E1142 Type 2 diabetes mellitus with diabetic polyneuropathy: Secondary | ICD-10-CM | POA: Insufficient documentation

## 2015-11-19 DIAGNOSIS — I214 Non-ST elevation (NSTEMI) myocardial infarction: Secondary | ICD-10-CM | POA: Insufficient documentation

## 2015-11-21 NOTE — Progress Notes (Signed)
10/21

## 2015-11-22 ENCOUNTER — Telehealth (INDEPENDENT_AMBULATORY_CARE_PROVIDER_SITE_OTHER): Payer: Self-pay

## 2015-11-22 ENCOUNTER — Ambulatory Visit (INDEPENDENT_AMBULATORY_CARE_PROVIDER_SITE_OTHER): Payer: Medicare Other | Admitting: Family

## 2015-11-22 VITALS — Ht 64.0 in | Wt 150.0 lb

## 2015-11-22 DIAGNOSIS — L97425 Non-pressure chronic ulcer of left heel and midfoot with muscle involvement without evidence of necrosis: Secondary | ICD-10-CM

## 2015-11-22 DIAGNOSIS — I739 Peripheral vascular disease, unspecified: Secondary | ICD-10-CM

## 2015-11-22 DIAGNOSIS — M86172 Other acute osteomyelitis, left ankle and foot: Secondary | ICD-10-CM

## 2015-11-22 DIAGNOSIS — E08621 Diabetes mellitus due to underlying condition with foot ulcer: Secondary | ICD-10-CM

## 2015-11-22 NOTE — Telephone Encounter (Signed)
Called and sw Kasey to advise that the pt was sent back to the facility with a dry dressing and advised that she was to have the wound vac applied and continue to change three times a day and that to the lateral side of the foot the two wounds can be treated with santyl 4x4 and gauze. This is to be changed daily and that pt will follow up with Dr. Lajoyce Corners in the office in one week.

## 2015-11-22 NOTE — Telephone Encounter (Signed)
Patient was in office today status post a large ulcer debridement of the left foot with wound vac placement and two smaller ulcers on the lateral side of the left foot. [name] [name] is wanting to know if Dr. Lajoyce Corners will be addressing these two smaller wounds and with what. I will call back to advise.

## 2015-11-22 NOTE — Progress Notes (Signed)
Wound Care Note   Patient: [name] [name]           Date of Birth: 1950-07-25           MRN: [id number]             PCP: Georgann Housekeeper, MD Visit Date: 11/22/2015   Assessment & Plan: Visit Diagnoses:  1. PVD (peripheral vascular disease) with claudication (HCC)   2. Other acute osteomyelitis of left foot (HCC)   3. Diabetic ulcer of left midfoot associated with diabetes mellitus due to underlying condition, with muscle involvement without evidence of necrosis (HCC)     Plan: Placed in a dry dressing today. We will resume the wound VAC once back at [facility]. Continue wound VAC with changes on Monday, Wednesday, and Friday. Return in 1 week for visit with Dr. Lajoyce Corners at which time she will discuss surgical options including skin grafting.  Follow-Up Instructions: Return in about 1 week (around 11/29/2015) for post op wound check.  Orders:  No orders of the defined types were placed in this encounter.  No orders of the defined types were placed in this encounter.     Procedures: No notes on file   Clinical Data: No additional findings.      No images are attached to the encounter.   Subjective: Chief Complaint  Patient presents with  . Left Foot - Wound Check    Left foot ulcer debridement 11/10/15 application of wound vac     Pt is s/p debridement of large necrotic ulcer left foot and application of wound vac 11/10/15. Vac was to be changed 3 x q week. Pt is at a skilled nursing facility -[facility] and states that they changes her dressing there. Nothing applied to the lateral side of the foot which has 2 smaller ulcers with pink and bleeding tissue. The foot is very swollen and pt states this is because she has " been ready for my appt since 7am" and contributes this to her foot being down and dependant. She is extremely tender to touch and foot is slightly pink but without warmth.    Wound Check     Review of Systems  Constitutional: Negative for chills and  fever.    Objective: Vital Signs: Ht 5\' 4"  (1.626 m)   Wt 150 lb (68 kg)   BMI 25.75 kg/m   Physical Exam: Left foot: Plantar wound Shows great granulation tissue as seen in attached image. Minimal bleeding. There is some exposed tendon as well. No purulence. There is some surrounding erythema and pitting edema to the foot. No cellulitis. To the lateral foot there are 2 small wounds also seen in images which are attached. These wounds are both approximately 2 cm in diameter and are covered with eschar. No drainage no cellulitis or sign of infection.  Specialty Comments: No specialty comments available.   PMFS History: Patient Active Problem List   Diagnosis Date Noted  . NSTEMI (non-ST elevated myocardial infarction) (HCC) 11/19/2015  . DM type 2 with diabetic peripheral neuropathy (HCC) 11/19/2015  . Traumatic hemorrhagic shock (HCC)   . Chronic systolic CHF (congestive heart failure) (HCC)   . Uncontrolled type 2 diabetes mellitus with complication (HCC)   . Demand myocardial infarction 11/08/2015  . Surgery, elective   . Central line infection, initial encounter   . Septic shock (HCC) 11/03/2015  . Encephalopathy acute 11/03/2015  . Acute blood loss anemia 11/03/2015  . Acute respiratory failure with hypoxia (HCC)   .  CVA (cerebral vascular accident) (HCC)   . S/P femoral-popliteal bypass surgery   . Malnutrition of moderate degree 11/01/2015  . Diabetic foot ulcer (HCC)   . Hypertensive heart disease with chronic diastolic congestive heart failure (HCC) 10/31/2015  . Osteomyelitis of left foot (HCC) 10/30/2015  . PVD (peripheral vascular disease) with claudication (HCC) 09/28/2015  . Critical lower limb ischemia 08/28/2015  . Hyperlipidemia with target LDL less than 70 06/28/2013  . Diabetes mellitus type II, uncontrolled (HCC) 03/03/2013  . Complicated migraine 03/03/2013  . Stroke-like symptom 03/02/2013  . Diabetes mellitus (HCC) 03/02/2013  . Aphasia 03/02/2013  .  Headache(784.0) 03/02/2013  . PAD (peripheral artery disease) (HCC) 06/05/2011  . Coronary artery disease involving native coronary artery of native heart without angina pectoris 06/05/2011  . Hypotension 06/05/2011  . Claudication in peripheral vascular disease:  Lifestyle limiting. 06/05/2011  . Hx of tobacco use, presenting hazards to health 06/05/2011   Past Medical History:  Diagnosis Date  . Arthritis    "feel like I have it all over" (08/28/2015)  . Complication of anesthesia    DIFFICULT WAKING "only when I was smoking; no problems since I quit"  . Coronary artery disease   . Family history of adverse reaction to anesthesia    sister slow to wake up  . GERD (gastroesophageal reflux disease)    takes Protonix daily   . Hip bursitis   . Hyperlipidemia LDL goal < 70 06/28/2013   takes Atorvastatin daily  . Hypertension    takes Metoprolol and Imdur daily  . Migraine    "none in a long time" (08/28/2015)  . Myocardial infarction 2011  . Neuromuscular disorder (HCC)    DIABETIC NEUROPATHY  . PAD (peripheral artery disease) (HCC)   . Peripheral vascular disease (HCC)   . Type II diabetes mellitus (HCC)    takes Lantus nightly.Average fasting blood sugar runs 80-90    Family History  Problem Relation [name] of Onset  . Hypertension Mother   . Heart failure Mother   . Heart failure Father   . Stroke Father   . Diabetes Father    Past Surgical History:  Procedure Laterality Date  . ABDOMINAL HYSTERECTOMY    . APPENDECTOMY    . ATHERECTOMY N/A 06/04/2011   Procedure: ATHERECTOMY;  Surgeon: Runell Gess, MD;  Location: [facility] CATH LAB;  Service: Cardiovascular;  Laterality: N/A;  . CARDIAC CATHETERIZATION  10/13/2009   95% stenosis in the AV groove circumflex and 95% ostial stenosis in small OM3. A 3x55mm drug-eluting Promus stent inserted ito the circumflex. Dilatated with a 3.25x32mm noncompliant Quantum balloon within entire segment. The entire region was reduced to 0% and  brisk TIMI3 flow.  . CAROTID DUPLEX  03/19/2011   Right ICA-demonstrates complete occlusion. Left ICA-demonstrates a small amount of fibrous plaque.  Marland Kitchen CATARACT EXTRACTION W/ INTRAOCULAR LENS IMPLANT Right   . CESAREAN SECTION  1990  . CORONARY ANGIOPLASTY    . ENDARTERECTOMY FEMORAL Left 09/05/2015   Procedure: ENDARTERECTOMY FEMORAL WITH PROFUNDOPLASTY;  Surgeon: Chuck Hint, MD;  Location: [facility] OR;  Service: Vascular;  Laterality: Left;  Left common femoral artery vein patch using left saphenous vien  . FEMORAL-POPLITEAL BYPASS GRAFT Left 11/02/2015   Procedure: BYPASS GRAFT FEMORAL-POPLITEAL ARTERY VS FEMORAL-TIBIAL ARTERY BYPASS;  Surgeon: Chuck Hint, MD;  Location: [facility] OR;  Service: Vascular;  Laterality: Left;  . I&D EXTREMITY Left 11/10/2015   Procedure: Debridement Left Foot Ulcer, Application  Wound VAC;  Surgeon: Randa Evens  Lajoyce Corners, MD;  Location: [facility] OR;  Service: Orthopedics;  Laterality: Left;  . ILIAC ARTERY STENT Left 08/28/2015   common  . INTRAOPERATIVE ARTERIOGRAM Left 09/05/2015   Procedure: INTRA OPERATIVE ARTERIOGRAM;  Surgeon: Chuck Hint, MD;  Location: [facility] OR;  Service: Vascular;  Laterality: Left;  . INTRAOPERATIVE ARTERIOGRAM Left 11/02/2015   Procedure: INTRA OPERATIVE ARTERIOGRAM;  Surgeon: Chuck Hint, MD;  Location: [facility] OR;  Service: Vascular;  Laterality: Left;  . LEXISCAN MYOVIEW  10/25/2010   Moderate perfusion defect due to infarct/scar with mild perinfarct ischemia seen in the Basal Inferolateral, Basal Anterolateral, Mid Inferolateral, and Mid Anterolateral regions. Post-stress EF is 50%.  . OVARY SURGERY  1983?   "ruptured"  . PERIPHERAL VASCULAR ANGIOGRAM  01/26/2010   High-grade SFA disease: left greater than right. Left SFA would require fem-pop bypass grafting. Right SFA could be stented but might require Diamondback Orbital atherectomy.  Marland Kitchen PERIPHERAL VASCULAR ANGIOGRAM  02/23/2010   Stealth Predator orbital rotational  atherectomy performed on SFA & Popliteal up to 90,000 RPM. Stenting using overlapping 5x136mm and 5x45mm Absolute Pro Nitinol self-expanding stents beginning just at the knee up to the mid SFA resulting in reduction of 90-95% calcified SFA & Popliteal stenosis to 0. Stenting performed on the distal common & proximal iliac artery with a 10x4 Absolute Pro- 70-0%.  Marland Kitchen PERIPHERAL VASCULAR ANGIOGRAM  06/17/2010   PTA performed to the right external iliac artery stent using a 5x100 balloon at 10 atmospheres. Stenting performed using a 6x18 Genesis on Opta balloon. Postdilatation with a 7x2 balloon resulting in a 95% "in-stent" stenosis to 0% residual.  . PERIPHERAL VASCULAR ANGIOGRAM  06/04/2011   Bilateral total SFAs not percutaneously addressable. Good canidate for femoropopliteal bypass grafting  . PERIPHERAL VASCULAR ANGIOGRAM  08/28/2015  . PERIPHERAL VASCULAR CATHETERIZATION N/A 08/28/2015   Procedure: Lower Extremity Angiography;  Surgeon: Runell Gess, MD;  Location: [facility] INVASIVE CV LAB;  Service: Cardiovascular;  Laterality: N/A;  . PERIPHERAL VASCULAR CATHETERIZATION N/A 08/28/2015   Procedure: Abdominal Aortogram;  Surgeon: Runell Gess, MD;  Location: [facility] INVASIVE CV LAB;  Service: Cardiovascular;  Laterality: N/A;  . PERIPHERAL VASCULAR CATHETERIZATION Left 08/28/2015   Procedure: Peripheral Vascular Intervention;  Surgeon: Runell Gess, MD;  Location: [facility] INVASIVE CV LAB;  Service: Cardiovascular;  Laterality: Left;  common iliac  . PERIPHERAL VASCULAR CATHETERIZATION Left 08/28/2015   Procedure: Peripheral Vascular Atherectomy;  Surgeon: Runell Gess, MD;  Location: [facility] INVASIVE CV LAB;  Service: Cardiovascular;  Laterality: Left;  common iliac  . PERIPHERAL VASCULAR CATHETERIZATION N/A 09/28/2015   Procedure: Lower Extremity Angiography;  Surgeon: Runell Gess, MD;  Location: [facility] INVASIVE CV LAB;  Service: Cardiovascular;  Laterality: N/A;  . PERIPHERAL VASCULAR CATHETERIZATION Left  09/28/2015   Procedure: Peripheral Vascular Intervention;  Surgeon: Runell Gess, MD;  Location: [facility] INVASIVE CV LAB;  Service: Cardiovascular;  Laterality: Left CFA  PCI with 9 mm x 4 cm Abbott nitinol absolute Pro self-expanding stent     . TRANSTHORACIC ECHOCARDIOGRAM  10/17/2009   EF 45-50%, moderate hypokinesis of the entire inferolateral myocardium, mild concentric hypertrophy and mild regurg of the mitral valva.  Marland Kitchen VEIN HARVEST Left 11/02/2015   Procedure: LEFT GREATER SAPHENOUS VEIN HARVEST;  Surgeon: Chuck Hint, MD;  Location: [facility] OR;  Service: Vascular;  Laterality: Left;   Social History   Occupational History  . retired    Social History Main Topics  . Smoking status: Former Smoker    Packs/day:  1.50    Years: 41.00    Types: Cigarettes    Quit date: 10/12/2009  . Smokeless tobacco: Never Used  . Alcohol use No  . Drug use: No  . Sexual activity: Not Currently    Birth control/ protection: Surgical

## 2015-11-25 LAB — CBC AND DIFFERENTIAL
HCT: 30 % — AB (ref 36–46)
Hemoglobin: 10 g/dL — AB (ref 12.0–16.0)
Platelets: 297 10*3/uL (ref 150–399)
WBC: 7.3 10^3/mL

## 2015-11-25 LAB — BASIC METABOLIC PANEL
BUN: 13 mg/dL (ref 4–21)
CREATININE: 0.6 mg/dL (ref 0.5–1.1)
GLUCOSE: 124 mg/dL
POTASSIUM: 3.9 mmol/L (ref 3.4–5.3)
SODIUM: 141 mmol/L (ref 137–147)

## 2015-11-28 ENCOUNTER — Ambulatory Visit (INDEPENDENT_AMBULATORY_CARE_PROVIDER_SITE_OTHER): Payer: Medicare Other | Admitting: Orthopedic Surgery

## 2015-11-28 ENCOUNTER — Encounter (INDEPENDENT_AMBULATORY_CARE_PROVIDER_SITE_OTHER): Payer: Self-pay | Admitting: Family

## 2015-11-28 DIAGNOSIS — E13621 Other specified diabetes mellitus with foot ulcer: Secondary | ICD-10-CM

## 2015-11-28 DIAGNOSIS — L97509 Non-pressure chronic ulcer of other part of unspecified foot with unspecified severity: Secondary | ICD-10-CM

## 2015-11-28 NOTE — Progress Notes (Signed)
Office Visit Note   Patient: [name] [name]           Date of Birth: March 07, 1950           MRN: [id number] Visit Date: 11/28/2015              Requested by: Georgann Housekeeper, MD [address] [place], [place] [address] PCP: Georgann Housekeeper, MD   Assessment & Plan: Visit Diagnoses:  1. Foot ulcer due to secondary DM [facility])     Plan: Plan for split-thickness skin graft on Friday. She will continue the wound VAC continues physical therapy at skilled nursing facility.  Follow-Up Instructions: Return in about 2 weeks (around 12/12/2015).   Orders:  No orders of the defined types were placed in this encounter.  No orders of the defined types were placed in this encounter.     Procedures: No procedures performed   Clinical Data: No additional findings.   Subjective: Chief Complaint  Patient presents with  . Left Foot - Routine Post Op    Debridement of left foot ulcer and application of wound vac.    Debridement of left foot ulcer and application of wound vas. Patient is 2 weeks and 4 days out from surgery. The pt has a wound vac in place and this is being changed at [facility]. There is a pink beefy tissue at the surgical site and there are two wounds on the lateral side of the foot. The one that is at the distal end of the foot is larger and macerated. The SNF is applying allevyn type dressing to these two wounds. The foot is swollen and slightly pink. The patient complains of pain.    Review of Systems   Objective: Vital Signs: There were no vitals taken for this visit.  Physical ExamOrtho Exam  Specialty Comments:  No specialty comments available.  Imaging: No results found.   PMFS History: Patient Active Problem List   Diagnosis Date Noted  . NSTEMI (non-ST elevated myocardial infarction) (HCC) 11/19/2015  . DM type 2 with diabetic peripheral neuropathy (HCC) 11/19/2015  . Traumatic hemorrhagic shock (HCC)   . Chronic systolic CHF (congestive heart  failure) (HCC)   . Uncontrolled type 2 diabetes mellitus with complication (HCC)   . Demand myocardial infarction 11/08/2015  . Surgery, elective   . Central line infection, initial encounter   . Septic shock (HCC) 11/03/2015  . Encephalopathy acute 11/03/2015  . Acute blood loss anemia 11/03/2015  . Acute respiratory failure with hypoxia (HCC)   . CVA (cerebral vascular accident) (HCC)   . S/P femoral-popliteal bypass surgery   . Malnutrition of moderate degree 11/01/2015  . Diabetic foot ulcer (HCC)   . Hypertensive heart disease with chronic diastolic congestive heart failure (HCC) 10/31/2015  . Osteomyelitis of left foot (HCC) 10/30/2015  . PVD (peripheral vascular disease) with claudication (HCC) 09/28/2015  . Critical lower limb ischemia 08/28/2015  . Hyperlipidemia with target LDL less than 70 06/28/2013  . Diabetes mellitus type II, uncontrolled (HCC) 03/03/2013  . Complicated migraine 03/03/2013  . Stroke-like symptom 03/02/2013  . Diabetes mellitus (HCC) 03/02/2013  . Aphasia 03/02/2013  . Headache(784.0) 03/02/2013  . PAD (peripheral artery disease) (HCC) 06/05/2011  . Coronary artery disease involving native coronary artery of native heart without angina pectoris 06/05/2011  . Hypotension 06/05/2011  . Claudication in peripheral vascular disease:  Lifestyle limiting. 06/05/2011  . Hx of tobacco use, presenting hazards to health 06/05/2011   Past Medical History:  Diagnosis Date  . Arthritis    "feel like I have it all over" (08/28/2015)  . Complication of anesthesia    DIFFICULT WAKING "only when I was smoking; no problems since I quit"  . Coronary artery disease   . Family history of adverse reaction to anesthesia    sister slow to wake up  . GERD (gastroesophageal reflux disease)    takes Protonix daily   . Hip bursitis   . Hyperlipidemia LDL goal < 70 06/28/2013   takes Atorvastatin daily  . Hypertension    takes Metoprolol and Imdur daily  . Migraine     "none in a long time" (08/28/2015)  . Myocardial infarction 2011  . Neuromuscular disorder (HCC)    DIABETIC NEUROPATHY  . PAD (peripheral artery disease) (HCC)   . Peripheral vascular disease (HCC)   . Type II diabetes mellitus (HCC)    takes Lantus nightly.Average fasting blood sugar runs 80-90    Family History  Problem Relation [name] of Onset  . Hypertension Mother   . Heart failure Mother   . Heart failure Father   . Stroke Father   . Diabetes Father     Past Surgical History:  Procedure Laterality Date  . ABDOMINAL HYSTERECTOMY    . APPENDECTOMY    . ATHERECTOMY N/A 06/04/2011   Procedure: ATHERECTOMY;  Surgeon: Runell GessJonathan J Berry, MD;  [facility];  Service: Cardiovascular;  Laterality: N/A;  . CARDIAC CATHETERIZATION  10/13/2009   95% stenosis in the AV groove circumflex and 95% ostial stenosis in small OM3. A 3x4128mm drug-eluting Promus stent inserted ito the circumflex. Dilatated with a 3.25x2912mm noncompliant Quantum balloon within entire segment. The entire region was reduced to 0% and brisk TIMI3 flow.  . CAROTID DUPLEX  03/19/2011   Right ICA-demonstrates complete occlusion. Left ICA-demonstrates a small amount of fibrous plaque.  Marland Kitchen. CATARACT EXTRACTION W/ INTRAOCULAR LENS IMPLANT Right   . CESAREAN SECTION  1990  . CORONARY ANGIOPLASTY    . ENDARTERECTOMY FEMORAL Left 09/05/2015   Procedure: ENDARTERECTOMY FEMORAL WITH PROFUNDOPLASTY;  Surgeon: Chuck Hinthristopher S Dickson, MD;  [facility];  Service: Vascular;  Laterality: Left;  Left common femoral artery vein patch using left saphenous vien  . FEMORAL-POPLITEAL BYPASS GRAFT Left 11/02/2015   Procedure: BYPASS GRAFT FEMORAL-POPLITEAL ARTERY VS FEMORAL-TIBIAL ARTERY BYPASS;  Surgeon: Chuck Hinthristopher S Dickson, MD;  [facility];  Service: Vascular;  Laterality: Left;  . I&D EXTREMITY Left 11/10/2015   Procedure: Debridement Left Foot Ulcer, Application  Wound VAC;  Surgeon: Nadara MustardMarcus V Duda, MD;  Location: [facility] OR;  Service:  Orthopedics;  Laterality: Left;  . ILIAC ARTERY STENT Left 08/28/2015   common  . INTRAOPERATIVE ARTERIOGRAM Left 09/05/2015   Procedure: INTRA OPERATIVE ARTERIOGRAM;  Surgeon: Chuck Hinthristopher S Dickson, MD;  [facility];  Service: Vascular;  Laterality: Left;  . INTRAOPERATIVE ARTERIOGRAM Left 11/02/2015   Procedure: INTRA OPERATIVE ARTERIOGRAM;  Surgeon: Chuck Hinthristopher S Dickson, MD;  [facility];  Service: Vascular;  Laterality: Left;  . LEXISCAN MYOVIEW  10/25/2010   Moderate perfusion defect due to infarct/scar with mild perinfarct ischemia seen in the Basal Inferolateral, Basal Anterolateral, Mid Inferolateral, and Mid Anterolateral regions. Post-stress EF is 50%.  . OVARY SURGERY  1983?   "ruptured"  . PERIPHERAL VASCULAR ANGIOGRAM  01/26/2010   High-grade SFA disease: left greater than right. Left SFA would require fem-pop bypass grafting. Right SFA could be stented but might require Diamondback Orbital atherectomy.  Marland Kitchen. PERIPHERAL VASCULAR ANGIOGRAM  02/23/2010  Stealth Predator orbital rotational atherectomy performed on SFA & Popliteal up to 90,000 RPM. Stenting using overlapping 5x118mm and 5x48mm Absolute Pro Nitinol self-expanding stents beginning just at the knee up to the mid SFA resulting in reduction of 90-95% calcified SFA & Popliteal stenosis to 0. Stenting performed on the distal common & proximal iliac artery with a 10x4 Absolute Pro- 70-0%.  Marland Kitchen PERIPHERAL VASCULAR ANGIOGRAM  06/17/2010   PTA performed to the right external iliac artery stent using a 5x100 balloon at 10 atmospheres. Stenting performed using a 6x18 Genesis on Opta balloon. Postdilatation with a 7x2 balloon resulting in a 95% "in-stent" stenosis to 0% residual.  . PERIPHERAL VASCULAR ANGIOGRAM  06/04/2011   Bilateral total SFAs not percutaneously addressable. Good canidate for femoropopliteal bypass grafting  . PERIPHERAL VASCULAR ANGIOGRAM  08/28/2015  . PERIPHERAL VASCULAR CATHETERIZATION N/A 08/28/2015   Procedure:  Lower Extremity Angiography;  Surgeon: Runell Gess, MD;  Location: [facility] INVASIVE CV LAB;  Service: Cardiovascular;  Laterality: N/A;  . PERIPHERAL VASCULAR CATHETERIZATION N/A 08/28/2015   Procedure: Abdominal Aortogram;  Surgeon: Runell Gess, MD;  Location: [facility] INVASIVE CV LAB;  Service: Cardiovascular;  Laterality: N/A;  . PERIPHERAL VASCULAR CATHETERIZATION Left 08/28/2015   Procedure: Peripheral Vascular Intervention;  Surgeon: Runell Gess, MD;  Location: [facility] INVASIVE CV LAB;  Service: Cardiovascular;  Laterality: Left;  common iliac  . PERIPHERAL VASCULAR CATHETERIZATION Left 08/28/2015   Procedure: Peripheral Vascular Atherectomy;  Surgeon: Runell Gess, MD;  Location: [facility] INVASIVE CV LAB;  Service: Cardiovascular;  Laterality: Left;  common iliac  . PERIPHERAL VASCULAR CATHETERIZATION N/A 09/28/2015   Procedure: Lower Extremity Angiography;  Surgeon: Runell Gess, MD;  Location: [facility] INVASIVE CV LAB;  Service: Cardiovascular;  Laterality: N/A;  . PERIPHERAL VASCULAR CATHETERIZATION Left 09/28/2015   Procedure: Peripheral Vascular Intervention;  Surgeon: Runell Gess, MD;  Location: [facility] INVASIVE CV LAB;  Service: Cardiovascular;  Laterality: Left CFA  PCI with 9 mm x 4 cm Abbott nitinol absolute Pro self-expanding stent     . TRANSTHORACIC ECHOCARDIOGRAM  10/17/2009   EF 45-50%, moderate hypokinesis of the entire inferolateral myocardium, mild concentric hypertrophy and mild regurg of the mitral valva.  Marland Kitchen VEIN HARVEST Left 11/02/2015   Procedure: LEFT GREATER SAPHENOUS VEIN HARVEST;  Surgeon: Chuck Hint, MD;  Location: [facility] OR;  Service: Vascular;  Laterality: Left;   Social History   Occupational History  . retired    Social History Main Topics  . Smoking status: Former Smoker    Packs/day: 1.50    Years: 41.00    Types: Cigarettes    Quit date: 10/12/2009  . Smokeless tobacco: Never Used  . Alcohol use No  . Drug use: No  . Sexual activity: Not Currently     Birth control/ protection: Surgical

## 2015-11-29 ENCOUNTER — Other Ambulatory Visit (INDEPENDENT_AMBULATORY_CARE_PROVIDER_SITE_OTHER): Payer: Self-pay | Admitting: Orthopedic Surgery

## 2015-11-29 DIAGNOSIS — E13621 Other specified diabetes mellitus with foot ulcer: Secondary | ICD-10-CM

## 2015-11-29 DIAGNOSIS — L97509 Non-pressure chronic ulcer of other part of unspecified foot with unspecified severity: Principal | ICD-10-CM

## 2015-11-29 NOTE — Telephone Encounter (Signed)
Closed encounter °

## 2015-11-30 ENCOUNTER — Encounter (HOSPITAL_COMMUNITY): Payer: Self-pay | Admitting: *Deleted

## 2015-11-30 NOTE — Progress Notes (Signed)
Anesthesia Chart Review:  Pt is same day work up.  Pt is a 65 year old female scheduled for L foot skin graft and vac on 12/01/2015 with Aldean Baker, MD.   - Cardiologist is Dr. Margo Aye. T[facility] Endo.  - PCP is Georgann Housekeeper, MD.   PMH includes:  CAD (DES to CX 2011), PAD (s/p L FPBG 11/02/15; s/p L femoral endarterectomy 09/05/15), CHF, HTN, DM, hyperlipidemia, stroke, GERD.  Former smoker. BMI 26  Pt hospitalized 10/2-10/17/17 for osteomyelitis, complicated by demand MI, traumatic hemorrhagic shock, septic shock, acute encephalopathy, acute blood loss anemia, acute respiratory failure with hypoxia, acute pulmonary edema, suspected small subcortical infarct not seen on CT. Pt was discharged to SNF.   Medications include: Amlodipine, ASA 81 mg, Lipitor, Plavix, doxycycline, Lasix, Lantus, Imdur, losartan, metformin, metoprolol, Protonix.  Labs will be obtained DOS.   1 view CXR 11/12/15: Increasing moderate to severe pulmonary edema.  EKG 11/04/15: NSR. Nonspecific T wave abnormality  Echo 11/08/15:  - Left ventricle: The cavity size was normal. Wall thickness was normal. Systolic function was normal. The estimated ejection fraction was in the range of 50% to 55%. There is akinesis of the basal-midinferoseptal myocardium. Doppler parameters are consistent with abnormal left ventricular relaxation (grade 1 diastolic dysfunction). - Mitral valve: Calcified annulus. Mildly thickened leaflets . - Left atrium: The atrium was mildly dilated. - Impressions: No cardiac source of emboli was indentified.  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 Stable 1-39% LICA stenosis.  Nuclear stress test 10/25/10:  1. There is a moderate perfusion defect due to infarct/scar with mild peri-infarct ischemia seen in the basal inferolateral, basal anterolateral, mid inferolateral and mid anterolateral regions. Post-stress LV is normal in size. 2. Post stress EF 50%. Global LV systolic function is  mildly reduced. Wall motion abnormalities. 3. Compared to previous study, there is no significant change. This is a low risk scan.  Cardiac cath 10/13/09:  1. Acute STEMI secondary to total proximal occlusion of a large dominant left CX vessel. 2. Mild concomitant CAD with 30% narrowing in the first diagonal branch of LAD and 20% LAD stenosis. 3. Nondominant normal RCA. 4. Successive PCI involving at least 4 sites in CX with PTCA of the distal CX with a 95% stenosis being reduced to 20%, PTCA of the OM 3 with a 95% stenosis being reduced to 40-50%, 40% narrowing in the mid AV groove CX, and 100% followed by 80% stenosis in the proximal CXx treated with DES  Pt tolerated similar surgery 11/10/15. If labs acceptable, I anticipate pt can proceed as scheduled.   Rica Mast, FNP-BC [facility]/Anesthesiology Phone: [phone] 11/30/2015 4:35 PM

## 2015-11-30 NOTE — Pre-Procedure Instructions (Signed)
    [name] [name]  11/30/2015     Your procedure is scheduled on Friday, November 3.  Report to [facility] Admitting at 11:00 AM  Call this number if you have problems the morning of surgery: [phone]   Remember:  Do not eat food or drink liquids after midnight.  Take these medicines the morning of surgery with A SIP OF WATER: Amlodipine (Norvasc),  Gabapentin, Isosorbide, Metoprolol, Pantoprazole.   DO NOT give Metformin the morning of Surgery.          May take Oxycodone- Acetaminophen if needed.  Thursday HS- Give 1/2 dose of Lantus- 17 units.  Friday, November 3:   o Check your blood sugar the morning of your surgery when you wake up and every 2 hours until you get to the Short Stay unit. . If your blood sugar is less than 70 mg/dL, you will need to treat for low blood sugar. o Treat a low blood sugar (less than 70 mg/dL) with  4 glucose tablets, OR glucose gel. o Recheck blood sugar in 15 minutes after treatment (to make sure it is greater than 70 mg/dL). If your blood sugar is not greater than 70 mg/dL on recheck, call [phone] for further instructions. . Report your blood sugar to the short stay nurse when you get to Short Stay.   per order of Dr Arta Bruce

## 2015-12-01 ENCOUNTER — Encounter (HOSPITAL_COMMUNITY)
Admission: RE | Disposition: A | Discharge status: Skilled Nursing Facility | Payer: Self-pay | Source: Ambulatory Visit | Attending: Orthopedic Surgery

## 2015-12-01 ENCOUNTER — Ambulatory Visit (HOSPITAL_COMMUNITY): Payer: Medicare Other

## 2015-12-01 ENCOUNTER — Ambulatory Visit (HOSPITAL_COMMUNITY): Payer: Medicare Other | Admitting: Emergency Medicine

## 2015-12-01 ENCOUNTER — Ambulatory Visit (HOSPITAL_COMMUNITY)
Admission: RE | Admit: 2015-12-01 | Discharge: 2015-12-01 | Disposition: A | Discharge status: Skilled Nursing Facility | Payer: Medicare Other | Source: Ambulatory Visit | Attending: Orthopedic Surgery | Admitting: Orthopedic Surgery

## 2015-12-01 ENCOUNTER — Encounter (HOSPITAL_COMMUNITY): Payer: Self-pay | Admitting: *Deleted

## 2015-12-01 DIAGNOSIS — I2510 Atherosclerotic heart disease of native coronary artery without angina pectoris: Secondary | ICD-10-CM | POA: Insufficient documentation

## 2015-12-01 DIAGNOSIS — I509 Heart failure, unspecified: Secondary | ICD-10-CM | POA: Insufficient documentation

## 2015-12-01 DIAGNOSIS — M1990 Unspecified osteoarthritis, unspecified site: Secondary | ICD-10-CM | POA: Diagnosis not present

## 2015-12-01 DIAGNOSIS — L97529 Non-pressure chronic ulcer of other part of left foot with unspecified severity: Secondary | ICD-10-CM | POA: Insufficient documentation

## 2015-12-01 DIAGNOSIS — I6523 Occlusion and stenosis of bilateral carotid arteries: Secondary | ICD-10-CM | POA: Insufficient documentation

## 2015-12-01 DIAGNOSIS — Z961 Presence of intraocular lens: Secondary | ICD-10-CM | POA: Insufficient documentation

## 2015-12-01 DIAGNOSIS — I252 Old myocardial infarction: Secondary | ICD-10-CM | POA: Insufficient documentation

## 2015-12-01 DIAGNOSIS — J811 Chronic pulmonary edema: Secondary | ICD-10-CM

## 2015-12-01 DIAGNOSIS — Z7982 Long term (current) use of aspirin: Secondary | ICD-10-CM | POA: Insufficient documentation

## 2015-12-01 DIAGNOSIS — E1151 Type 2 diabetes mellitus with diabetic peripheral angiopathy without gangrene: Secondary | ICD-10-CM | POA: Diagnosis not present

## 2015-12-01 DIAGNOSIS — I110 Hypertensive heart disease with heart failure: Secondary | ICD-10-CM | POA: Diagnosis not present

## 2015-12-01 DIAGNOSIS — E13621 Other specified diabetes mellitus with foot ulcer: Secondary | ICD-10-CM

## 2015-12-01 DIAGNOSIS — E08621 Diabetes mellitus due to underlying condition with foot ulcer: Secondary | ICD-10-CM

## 2015-12-01 DIAGNOSIS — Z9841 Cataract extraction status, right eye: Secondary | ICD-10-CM | POA: Insufficient documentation

## 2015-12-01 DIAGNOSIS — E1140 Type 2 diabetes mellitus with diabetic neuropathy, unspecified: Secondary | ICD-10-CM | POA: Diagnosis not present

## 2015-12-01 DIAGNOSIS — Z8673 Personal history of transient ischemic attack (TIA), and cerebral infarction without residual deficits: Secondary | ICD-10-CM | POA: Insufficient documentation

## 2015-12-01 DIAGNOSIS — Z955 Presence of coronary angioplasty implant and graft: Secondary | ICD-10-CM | POA: Insufficient documentation

## 2015-12-01 DIAGNOSIS — E785 Hyperlipidemia, unspecified: Secondary | ICD-10-CM | POA: Diagnosis not present

## 2015-12-01 DIAGNOSIS — Z90710 Acquired absence of both cervix and uterus: Secondary | ICD-10-CM | POA: Insufficient documentation

## 2015-12-01 DIAGNOSIS — Z833 Family history of diabetes mellitus: Secondary | ICD-10-CM | POA: Insufficient documentation

## 2015-12-01 DIAGNOSIS — S91302A Unspecified open wound, left foot, initial encounter: Secondary | ICD-10-CM | POA: Diagnosis not present

## 2015-12-01 DIAGNOSIS — Z888 Allergy status to other drugs, medicaments and biological substances status: Secondary | ICD-10-CM | POA: Insufficient documentation

## 2015-12-01 DIAGNOSIS — Z87891 Personal history of nicotine dependence: Secondary | ICD-10-CM | POA: Insufficient documentation

## 2015-12-01 DIAGNOSIS — L97425 Non-pressure chronic ulcer of left heel and midfoot with muscle involvement without evidence of necrosis: Secondary | ICD-10-CM

## 2015-12-01 DIAGNOSIS — K219 Gastro-esophageal reflux disease without esophagitis: Secondary | ICD-10-CM | POA: Diagnosis not present

## 2015-12-01 DIAGNOSIS — Z91040 Latex allergy status: Secondary | ICD-10-CM | POA: Insufficient documentation

## 2015-12-01 DIAGNOSIS — L97509 Non-pressure chronic ulcer of other part of unspecified foot with unspecified severity: Secondary | ICD-10-CM

## 2015-12-01 DIAGNOSIS — I5022 Chronic systolic (congestive) heart failure: Secondary | ICD-10-CM | POA: Diagnosis not present

## 2015-12-01 DIAGNOSIS — Z794 Long term (current) use of insulin: Secondary | ICD-10-CM | POA: Insufficient documentation

## 2015-12-01 DIAGNOSIS — Z880 Allergy status to penicillin: Secondary | ICD-10-CM | POA: Insufficient documentation

## 2015-12-01 DIAGNOSIS — R918 Other nonspecific abnormal finding of lung field: Secondary | ICD-10-CM | POA: Diagnosis not present

## 2015-12-01 DIAGNOSIS — Z823 Family history of stroke: Secondary | ICD-10-CM | POA: Insufficient documentation

## 2015-12-01 DIAGNOSIS — Z8249 Family history of ischemic heart disease and other diseases of the circulatory system: Secondary | ICD-10-CM | POA: Insufficient documentation

## 2015-12-01 DIAGNOSIS — E11621 Type 2 diabetes mellitus with foot ulcer: Secondary | ICD-10-CM | POA: Insufficient documentation

## 2015-12-01 HISTORY — DX: Cerebral infarction, unspecified: I63.9

## 2015-12-01 HISTORY — DX: Severe sepsis with septic shock: R65.21

## 2015-12-01 HISTORY — PX: SKIN SPLIT GRAFT: SHX444

## 2015-12-01 HISTORY — DX: Respiratory failure, unspecified, unspecified whether with hypoxia or hypercapnia: J96.90

## 2015-12-01 HISTORY — DX: Anemia, unspecified: D64.9

## 2015-12-01 HISTORY — DX: Personal history of other medical treatment: Z92.89

## 2015-12-01 HISTORY — DX: Heart failure, unspecified: I50.9

## 2015-12-01 HISTORY — DX: Osteomyelitis, unspecified: M86.9

## 2015-12-01 HISTORY — DX: Sepsis, unspecified organism: A41.9

## 2015-12-01 HISTORY — DX: Unspecified protein-calorie malnutrition: E46

## 2015-12-01 LAB — PROTIME-INR
INR: 1.15
Prothrombin Time: 14.8 seconds (ref 11.4–15.2)

## 2015-12-01 LAB — COMPREHENSIVE METABOLIC PANEL
ALBUMIN: 2.9 g/dL — AB (ref 3.5–5.0)
ALK PHOS: 72 U/L (ref 38–126)
ALT: 9 U/L — AB (ref 14–54)
ANION GAP: 11 (ref 5–15)
AST: 19 U/L (ref 15–41)
BUN: 15 mg/dL (ref 6–20)
CHLORIDE: 107 mmol/L (ref 101–111)
CO2: 22 mmol/L (ref 22–32)
CREATININE: 0.51 mg/dL (ref 0.44–1.00)
Calcium: 8.8 mg/dL — ABNORMAL LOW (ref 8.9–10.3)
GFR calc non Af Amer: >60 mL/min (ref >60)
GLUCOSE: 84 mg/dL (ref 65–99)
Potassium: 3.9 mmol/L (ref 3.5–5.1)
SODIUM: 140 mmol/L (ref 135–145)
Total Bilirubin: 0.5 mg/dL (ref 0.3–1.2)
Total Protein: 6.7 g/dL (ref 6.5–8.1)

## 2015-12-01 LAB — CBC
HCT: 40.5 % (ref 36.0–46.0)
HEMOGLOBIN: 13.5 g/dL (ref 12.0–15.0)
MCH: 29.2 pg (ref 26.0–34.0)
MCHC: 33.3 g/dL (ref 30.0–36.0)
MCV: 87.5 fL (ref 78.0–100.0)
PLATELETS: 225 10*3/uL (ref 150–400)
RBC: 4.63 MIL/uL (ref 3.87–5.11)
RDW: 14.9 % (ref 11.5–15.5)
WBC: 7.7 10*3/uL (ref 4.0–10.5)

## 2015-12-01 LAB — GLUCOSE, CAPILLARY
GLUCOSE-CAPILLARY: 92 mg/dL (ref 65–99)
Glucose-Capillary: 70 mg/dL (ref 65–99)
Glucose-Capillary: 71 mg/dL (ref 65–99)

## 2015-12-01 LAB — APTT: APTT: 28 s (ref 24–36)

## 2015-12-01 SURGERY — APPLICATION, GRAFT, SKIN, SPLIT-THICKNESS
Anesthesia: Monitor Anesthesia Care | Site: Foot | Laterality: Left

## 2015-12-01 MED ORDER — 0.9 % SODIUM CHLORIDE (POUR BTL) OPTIME
TOPICAL | Status: DC | PRN
  Administered 2015-12-01: 200 mL

## 2015-12-01 MED ORDER — PROMETHAZINE HCL 25 MG/ML IJ SOLN: 6.2500 mg | INTRAMUSCULAR | Status: DC | PRN

## 2015-12-01 MED ORDER — MIDAZOLAM HCL 2 MG/2ML IJ SOLN
INTRAMUSCULAR | Status: AC
  Filled 2015-12-01: qty 2

## 2015-12-01 MED ORDER — FENTANYL CITRATE (PF) 100 MCG/2ML IJ SOLN: 25.0000 ug | INTRAMUSCULAR | Status: DC | PRN

## 2015-12-01 MED ORDER — LACTATED RINGERS IV SOLN: INTRAVENOUS | Status: DC

## 2015-12-01 MED ORDER — CLINDAMYCIN PHOSPHATE 900 MG/50ML IV SOLN
INTRAVENOUS | Status: AC
  Filled 2015-12-01: qty 50

## 2015-12-01 MED ORDER — MUPIROCIN CALCIUM 2 % EX CREA
TOPICAL_CREAM | CUTANEOUS | Status: AC
  Filled 2015-12-01: qty 15

## 2015-12-01 MED ORDER — LACTATED RINGERS IV SOLN
INTRAVENOUS | Status: DC
  Administered 2015-12-01: 12:00:00 via INTRAVENOUS

## 2015-12-01 MED ORDER — FENTANYL CITRATE (PF) 100 MCG/2ML IJ SOLN
INTRAMUSCULAR | Status: AC
  Filled 2015-12-01: qty 2

## 2015-12-01 MED ORDER — MINERAL OIL LIGHT 100 % EX OIL
TOPICAL_OIL | CUTANEOUS | Status: AC
  Filled 2015-12-01: qty 25

## 2015-12-01 MED ORDER — FENTANYL CITRATE (PF) 100 MCG/2ML IJ SOLN
INTRAMUSCULAR | Status: DC | PRN
  Administered 2015-12-01 (×2): 50 ug via INTRAVENOUS

## 2015-12-01 MED ORDER — MEPERIDINE HCL 25 MG/ML IJ SOLN: 6.2500 mg | INTRAMUSCULAR | Status: DC | PRN

## 2015-12-01 MED ORDER — CHLORHEXIDINE GLUCONATE 4 % EX LIQD: 60.0000 mL | Freq: Once | CUTANEOUS | Status: DC

## 2015-12-01 MED ORDER — PROPOFOL 500 MG/50ML IV EMUL
INTRAVENOUS | Status: DC | PRN
  Administered 2015-12-01: 30 ug/kg/min via INTRAVENOUS

## 2015-12-01 MED ORDER — CLINDAMYCIN PHOSPHATE 900 MG/50ML IV SOLN
900.0000 mg | INTRAVENOUS | Status: AC
  Administered 2015-12-01: 900 mg via INTRAVENOUS

## 2015-12-01 MED ORDER — MIDAZOLAM HCL 5 MG/5ML IJ SOLN
INTRAMUSCULAR | Status: DC | PRN
  Administered 2015-12-01 (×2): 1 mg via INTRAVENOUS

## 2015-12-01 SURGICAL SUPPLY — 24 items
BNDG CMPR 9X4 STRL LF SNTH (GAUZE/BANDAGES/DRESSINGS) ×1
BNDG ESMARK 4X9 LF (GAUZE/BANDAGES/DRESSINGS) ×3 IMPLANT
COVER SURGICAL LIGHT HANDLE (MISCELLANEOUS) ×6 IMPLANT
DRAPE U-SHAPE 47X51 STRL (DRAPES) ×3 IMPLANT
DRSG MEPITEL 4X7.2 (GAUZE/BANDAGES/DRESSINGS) ×3 IMPLANT
DRSG VAC ATS SM SENSATRAC (GAUZE/BANDAGES/DRESSINGS) ×2 IMPLANT
DURAPREP 26ML APPLICATOR (WOUND CARE) ×3 IMPLANT
ELECT REM PT RETURN 9FT ADLT (ELECTROSURGICAL) ×3
ELECTRODE REM PT RTRN 9FT ADLT (ELECTROSURGICAL) ×1 IMPLANT
GLOVE BIOGEL PI IND STRL 9 (GLOVE) ×1 IMPLANT
GLOVE BIOGEL PI INDICATOR 9 (GLOVE) ×2
GLOVE SURG ORTHO 9.0 STRL STRW (GLOVE) ×3 IMPLANT
GRAFT TISS THERASKIN 2X3 (Tissue) IMPLANT
KIT BASIN OR (CUSTOM PROCEDURE TRAY) ×3 IMPLANT
KIT ROOM TURNOVER OR (KITS) ×3 IMPLANT
MANIFOLD NEPTUNE II (INSTRUMENTS) ×3 IMPLANT
PACK ORTHO EXTREMITY (CUSTOM PROCEDURE TRAY) ×3 IMPLANT
PAD ARMBOARD 7.5X6 YLW CONV (MISCELLANEOUS) ×6 IMPLANT
SUCTION FRAZIER HANDLE 10FR (MISCELLANEOUS)
SUCTION TUBE FRAZIER 10FR DISP (MISCELLANEOUS) IMPLANT
TISSUE THERASKIN 2X3 (Tissue) ×6 IMPLANT
TOWEL OR 17X24 6PK STRL BLUE (TOWEL DISPOSABLE) ×3 IMPLANT
TOWEL OR 17X26 10 PK STRL BLUE (TOWEL DISPOSABLE) ×3 IMPLANT
WATER STERILE IRR 1000ML POUR (IV SOLUTION) ×3 IMPLANT

## 2015-12-01 NOTE — Anesthesia Postprocedure Evaluation (Signed)
Anesthesia Post Note  Patient: [name] [name]  Procedure(s) Performed: Procedure(s) (LRB): LEFT FOOT SKIN GRAFT AND VAC (Left)  Patient location during evaluation: PACU Anesthesia Type: MAC Level of consciousness: awake and alert Pain management: pain level controlled Vital Signs Assessment: post-procedure vital signs reviewed and stable Respiratory status: spontaneous breathing, nonlabored ventilation, respiratory function stable and patient connected to nasal cannula oxygen Cardiovascular status: stable and blood pressure returned to baseline Anesthetic complications: no    Last Vitals:  Vitals:   12/01/15 1715 12/01/15 1724  BP: (!) 150/62 (!) 158/64  Pulse: 84 94  Resp: 14 14  Temp: 36.2 C     Last Pain:  Vitals:   12/01/15 1724  TempSrc:   PainSc: 0-No pain                 Shelton Silvas

## 2015-12-01 NOTE — Anesthesia Preprocedure Evaluation (Addendum)
Anesthesia Evaluation  Patient identified by MRN, date of birth, ID band Patient awake    Reviewed: reviewed documented beta blocker date and time   Airway Mallampati: II  TM Distance: >3 FB Neck ROM: Full    Dental  (+) Dental Advisory Given, Upper Dentures   Pulmonary former smoker,    breath sounds clear to auscultation       Cardiovascular hypertension, Pt. on medications and Pt. on home beta blockers + CAD, + Past MI, + Peripheral Vascular Disease and +CHF   Rhythm:Regular Rate:Bradycardia     Neuro/Psych  Headaches, PSYCHIATRIC DISORDERS  Neuromuscular disease CVA    GI/Hepatic Neg liver ROS, GERD  Medicated,  Endo/Other  diabetes, Type 2, Insulin Dependent, Oral Hypoglycemic Agents  Renal/GU negative Renal ROS  negative genitourinary   Musculoskeletal  (+) Arthritis , Osteoarthritis,    Abdominal   Peds negative pediatric ROS (+)  Hematology negative hematology ROS (+)   Anesthesia Other Findings   Reproductive/Obstetrics negative OB ROS                           Echo 11/08/15:  - Left ventricle: The cavity size was normal. Wall thickness was normal. Systolic function was normal. The estimated ejection fraction was in the range of 50% to 55%. There is akinesis of the basal-midinferoseptal myocardium. Doppler parameters are consistent with abnormal left ventricular relaxation (grade 1 diastolic dysfunction). - Mitral valve: Calcified annulus. Mildly thickened leaflets . - Left atrium: The atrium was mildly dilated. - Impressions: No cardiac source of emboli was indentified.  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 Stable 1-39% LICA stenosis.  Nuclear stress test 10/25/10:  1. There is a moderate perfusion defect due to infarct/scar with mild peri-infarct ischemia seen in the basal inferolateral, basal anterolateral, mid inferolateral and mid anterolateral regions.  Post-stress LV is normal in size. 2. Post stress EF 50%. Global LV systolic function is mildly reduced. Wall motion abnormalities. 3. Compared to previous study, there is no significant change. This is a low risk scan.  Cardiac cath 10/13/09:  1. Acute STEMI secondary to total proximal occlusion of a large dominant left CX vessel. 2. Mild concomitant CAD with 30% narrowing in the first diagonal branch of LAD and 20% LAD stenosis. 3. Nondominant normal RCA. 4. Successive PCI involving at least 4 sites in CX with PTCA of the distal CX with a 95% stenosis being reduced to 20%, PTCA of the OM 3 with a 95% stenosis being reduced to 40-50%, 40% narrowing in the mid AV groove CX, and 100% followed by 80% stenosis in the proximal CXx treated with DES  Anesthesia Physical Anesthesia Plan  ASA: III  Anesthesia Plan: MAC   Post-op Pain Management:    Induction: Intravenous  Airway Management Planned: Natural Airway  Additional Equipment: None  Intra-op Plan:   Post-operative Plan:   Informed Consent: I have reviewed the patients History and Physical, chart, labs and discussed the procedure including the risks, benefits and alternatives for the proposed anesthesia with the patient or authorized representative who has indicated his/her understanding and acceptance.     Plan Discussed with: CRNA and Surgeon  Anesthesia Plan Comments:       Anesthesia Quick Evaluation

## 2015-12-01 NOTE — Op Note (Signed)
12/01/2015  4:43 PM  PATIENT:  [name]    PRE-OPERATIVE DIAGNOSIS:  Wound Left Foot  POST-OPERATIVE DIAGNOSIS:  Same  PROCEDURE:  LEFT FOOT SKIN GRAFT AND VAC  SURGEON:  [name], MD  PHYSICIAN ASSISTANT:None ANESTHESIA:   General  PREOPERATIVE INDICATIONS:  [name] [name] is a  65 y.o. female with a diagnosis of Wound Left Foot who failed conservative measures and elected for surgical management.    The risks benefits and alternatives were discussed with the patient preoperatively including but not limited to the risks of infection, bleeding, nerve injury, cardiopulmonary complications, the need for revision surgery, among others, and the patient was willing to proceed.  OPERATIVE IMPLANTS: TheraSkin grafts 2 x 3" 2  OPERATIVE FINDINGS: Good granulation tissue on the plantar aspect of her foot the lateral column had ischemic ulcers over the fifth metatarsal 2  OPERATIVE PROCEDURE: Patient brought the operating room after undergoing a regional block. After adequate levels anesthesia were obtained patient's left lower extremity was prepped using DuraPrep draped in the sterile field a timeout was called. The plantar wound which was 4 x 6" was debrided of skin and soft tissue with a 10 blade knife back to granulating tissue. Patient also had 2 ulcers laterally which were also debrided to granulating tissue these were both 10 mm in diameter. The plantar wound was 5 mm deep. The TheraSkin skin graft was applied and stapled in place to units of the 2 x 3" were applied the plantar aspect and a small piece was applied to the ulcers on the lateral column. A wound VAC was applied this had a good suction fit Mepitel was applied directly to the wound. Patient was taken the PACU in stable condition.  Plan: Plan for discharge back to skilled nursing no change in her current care nonweightbearing on the left.

## 2015-12-01 NOTE — H&P (Signed)
[name] [name] is an 65 y.o. female.   Chief Complaint: Wound dorsum left foot HPI: Patient is a 65 year old woman diabetic insensate neuropathy peripheral vascular disease who is status post debridement and foot salvage for an infection of the left foot. Patient has a good granulating wound at this time and presents at this time for split-thickness skin graft.  Past Medical History:  Diagnosis Date  . Anemia 10/2015   Acute Blood Loss  . Arthritis    "feel like I have it all over" (08/28/2015)  . CHF (congestive heart failure) (HCC)   . Complication of anesthesia    DIFFICULT WAKING "only when I was smoking; no problems since I quit"  . Coronary artery disease   . Family history of adverse reaction to anesthesia    sister slow to wake up  . GERD (gastroesophageal reflux disease)    takes Protonix daily   . Hip bursitis   . History of blood transfusion    10/2015  . Hyperlipidemia LDL goal < 70 06/28/2013   takes Atorvastatin daily  . Hypertension    takes Metoprolol and Imdur daily  . Malnutrition (HCC)   . Migraine    "none in a long time" (08/28/2015)  . Myocardial infarction 2011  . Neuromuscular disorder (HCC)    DIABETIC NEUROPATHY  . Osteomyelitis (HCC) 2017   Left foot  . PAD (peripheral artery disease) (HCC)   . Peripheral vascular disease (HCC)   . Respiratory failure (HCC) 10/2015   Acute Hypoxia- acute pulmonary edema 11/13/2015  . Septic shock (HCC) 10/2015  . Stroke (HCC)   . Type II diabetes mellitus (HCC)    takes Lantus nightly.Average fasting blood sugar runs 80-90  Type II    Past Surgical History:  Procedure Laterality Date  . ABDOMINAL HYSTERECTOMY    . APPENDECTOMY    . ATHERECTOMY N/A 06/04/2011   Procedure: ATHERECTOMY;  Surgeon: Runell Gess, MD;  Location: [facility] CATH LAB;  Service: Cardiovascular;  Laterality: N/A;  . CARDIAC CATHETERIZATION  10/13/2009   95% stenosis in the AV groove circumflex and 95% ostial stenosis in small OM3. A 3x43mm  drug-eluting Promus stent inserted ito the circumflex. Dilatated with a 3.25x28mm noncompliant Quantum balloon within entire segment. The entire region was reduced to 0% and brisk TIMI3 flow.  . CAROTID DUPLEX  03/19/2011   Right ICA-demonstrates complete occlusion. Left ICA-demonstrates a small amount of fibrous plaque.  Marland Kitchen CATARACT EXTRACTION W/ INTRAOCULAR LENS IMPLANT Right   . CESAREAN SECTION  1990  . CORONARY ANGIOPLASTY    . ENDARTERECTOMY FEMORAL Left 09/05/2015   Procedure: ENDARTERECTOMY FEMORAL WITH PROFUNDOPLASTY;  Surgeon: Chuck Hint, MD;  Location: [facility] OR;  Service: Vascular;  Laterality: Left;  Left common femoral artery vein patch using left saphenous vien  . FEMORAL-POPLITEAL BYPASS GRAFT Left 11/02/2015   Procedure: BYPASS GRAFT FEMORAL-POPLITEAL ARTERY VS FEMORAL-TIBIAL ARTERY BYPASS;  Surgeon: Chuck Hint, MD;  Location: [facility] OR;  Service: Vascular;  Laterality: Left;  . I&D EXTREMITY Left 11/10/2015   Procedure: Debridement Left Foot Ulcer, Application  Wound VAC;  Surgeon: [name], MD;  Location: [facility] OR;  Service: Orthopedics;  Laterality: Left;  . ILIAC ARTERY STENT Left 08/28/2015   common  . INTRAOPERATIVE ARTERIOGRAM Left 09/05/2015   Procedure: INTRA OPERATIVE ARTERIOGRAM;  Surgeon: Chuck Hint, MD;  Location: [facility] OR;  Service: Vascular;  Laterality: Left;  . INTRAOPERATIVE ARTERIOGRAM Left 11/02/2015   Procedure: INTRA OPERATIVE ARTERIOGRAM;  Surgeon: Chuck Hint,  MD;  Location: [facility] OR;  Service: Vascular;  Laterality: Left;  . LEXISCAN MYOVIEW  10/25/2010   Moderate perfusion defect due to infarct/scar with mild perinfarct ischemia seen in the Basal Inferolateral, Basal Anterolateral, Mid Inferolateral, and Mid Anterolateral regions. Post-stress EF is 50%.  . OVARY SURGERY  1983?   "ruptured"  . PERIPHERAL VASCULAR ANGIOGRAM  01/26/2010   High-grade SFA disease: left greater than right. Left SFA would require fem-pop bypass  grafting. Right SFA could be stented but might require Diamondback Orbital atherectomy.  Marland Kitchen PERIPHERAL VASCULAR ANGIOGRAM  02/23/2010   Stealth Predator orbital rotational atherectomy performed on SFA & Popliteal up to 90,000 RPM. Stenting using overlapping 5x159mm and 5x53mm Absolute Pro Nitinol self-expanding stents beginning just at the knee up to the mid SFA resulting in reduction of 90-95% calcified SFA & Popliteal stenosis to 0. Stenting performed on the distal common & proximal iliac artery with a 10x4 Absolute Pro- 70-0%.  Marland Kitchen PERIPHERAL VASCULAR ANGIOGRAM  06/17/2010   PTA performed to the right external iliac artery stent using a 5x100 balloon at 10 atmospheres. Stenting performed using a 6x18 Genesis on Opta balloon. Postdilatation with a 7x2 balloon resulting in a 95% "in-stent" stenosis to 0% residual.  . PERIPHERAL VASCULAR ANGIOGRAM  06/04/2011   Bilateral total SFAs not percutaneously addressable. Good canidate for femoropopliteal bypass grafting  . PERIPHERAL VASCULAR ANGIOGRAM  08/28/2015  . PERIPHERAL VASCULAR CATHETERIZATION N/A 08/28/2015   Procedure: Lower Extremity Angiography;  Surgeon: Runell Gess, MD;  Location: [facility] INVASIVE CV LAB;  Service: Cardiovascular;  Laterality: N/A;  . PERIPHERAL VASCULAR CATHETERIZATION N/A 08/28/2015   Procedure: Abdominal Aortogram;  Surgeon: Runell Gess, MD;  Location: [facility] INVASIVE CV LAB;  Service: Cardiovascular;  Laterality: N/A;  . PERIPHERAL VASCULAR CATHETERIZATION Left 08/28/2015   Procedure: Peripheral Vascular Intervention;  Surgeon: Runell Gess, MD;  Location: [facility] INVASIVE CV LAB;  Service: Cardiovascular;  Laterality: Left;  common iliac  . PERIPHERAL VASCULAR CATHETERIZATION Left 08/28/2015   Procedure: Peripheral Vascular Atherectomy;  Surgeon: Runell Gess, MD;  Location: [facility] INVASIVE CV LAB;  Service: Cardiovascular;  Laterality: Left;  common iliac  . PERIPHERAL VASCULAR CATHETERIZATION N/A 09/28/2015   Procedure: Lower  Extremity Angiography;  Surgeon: Runell Gess, MD;  Location: [facility] INVASIVE CV LAB;  Service: Cardiovascular;  Laterality: N/A;  . PERIPHERAL VASCULAR CATHETERIZATION Left 09/28/2015   Procedure: Peripheral Vascular Intervention;  Surgeon: Runell Gess, MD;  Location: [facility] INVASIVE CV LAB;  Service: Cardiovascular;  Laterality: Left CFA  PCI with 9 mm x 4 cm Abbott nitinol absolute Pro self-expanding stent     . TRANSTHORACIC ECHOCARDIOGRAM  10/17/2009   EF 45-50%, moderate hypokinesis of the entire inferolateral myocardium, mild concentric hypertrophy and mild regurg of the mitral valva.  Marland Kitchen VEIN HARVEST Left 11/02/2015   Procedure: LEFT GREATER SAPHENOUS VEIN HARVEST;  Surgeon: Chuck Hint, MD;  Location: [facility] OR;  Service: Vascular;  Laterality: Left;    Family History  Problem Relation Age of Onset  . Hypertension Mother   . Heart failure Mother   . Heart failure Father   . Stroke Father   . Diabetes Father    Social History:  reports that she quit smoking about 6 years ago. Her smoking use included Cigarettes. She has a 61.50 pack-year smoking history. She has never used smokeless tobacco. She reports that she does not drink alcohol or use drugs.  Allergies:  Allergies  Allergen Reactions  . Hydrochlorothiazide Other (See Comments)  lethargic   . Latex Rash  . Penicillins Swelling and Rash    Pt states she has tolerated Keflex in the past without problems. States she may have tolerated Augmentin in the past but it caused GI upset. Has patient had a PCN reaction causing immediate rash, facial/tongue/throat swelling, SOB or lightheadedness with hypotension: Yes Has patient had a PCN reaction causing severe rash involving mucus membranes or skin necrosis: No Has patient had a PCN reaction that required hospitalization No Has patient had a PCN reaction occurring within the last 10 years: No    No prescriptions prior to admission.    No results found for this or any  previous visit (from the past 48 hour(s)). No results found.  Review of Systems  All other systems reviewed and are negative.   There were no vitals taken for this visit. Physical Exam  On examination patient is alert oriented no adenopathy well-dressed normal affect normal story effort has an antalgic gait she does not have a palpable pulse. She has a massive wound on the dorsum of the left foot. There is good granulation tissue there is no purulence no cellulitis no signs of infection. Assessment/Plan Assessment: Granulating wound dorsum left foot.  Plan: We'll plan for split-thickness skin graft application of a wound VAC. Risk and benefits were discussed including risk of 9 union of the skin graft potential for additional surgery. Patient states she understands wish to proceed at this time  Nadara MustardMarcus V Duda, MD 12/01/2015, 6:37 AM

## 2015-12-01 NOTE — Transfer of Care (Signed)
Immediate Anesthesia Transfer of Care Note  Patient: [name] [name]  Procedure(s) Performed: Procedure(s): LEFT FOOT SKIN GRAFT AND VAC (Left)  Patient Location: PACU  Anesthesia Type:MAC  Level of Consciousness: awake, alert , oriented and patient cooperative  Airway & Oxygen Therapy: Patient Spontanous Breathing and Patient connected to face mask oxygen  Post-op Assessment: Report given to RN and Post -op Vital signs reviewed and stable  Post vital signs: Reviewed and stable  Last Vitals:  Vitals:   12/01/15 1145 12/01/15 1641  BP: 122/67   Pulse: 80   Resp: 20   Temp: 37.1 C 36.1 C    Last Pain:  Vitals:   12/01/15 1145  TempSrc: Oral      Patients Stated Pain Goal: 2 (12/01/15 1151)  Complications: No apparent anesthesia complications

## 2015-12-04 ENCOUNTER — Telehealth (INDEPENDENT_AMBULATORY_CARE_PROVIDER_SITE_OTHER): Payer: Self-pay | Admitting: Orthopedic Surgery

## 2015-12-04 ENCOUNTER — Encounter (HOSPITAL_COMMUNITY): Payer: Self-pay | Admitting: Orthopedic Surgery

## 2015-12-04 LAB — CBC AND DIFFERENTIAL
HCT: 32 % — AB (ref 36–46)
Hemoglobin: 10.8 g/dL — AB (ref 12.0–16.0)
PLATELETS: 257 10*3/uL (ref 150–399)
WBC: 8.7 10*3/mL

## 2015-12-04 LAB — BASIC METABOLIC PANEL
BUN: 16 mg/dL (ref 4–21)
CREATININE: 0.5 mg/dL (ref 0.5–1.1)
GLUCOSE: 79 mg/dL
POTASSIUM: 3.7 mmol/L (ref 3.4–5.3)
Sodium: 142 mmol/L (ref 137–147)

## 2015-12-04 NOTE — Telephone Encounter (Signed)
[name] [name] with [facility] called needing WD care orders.  The number to contact her is [phone]

## 2015-12-05 ENCOUNTER — Encounter: Payer: Self-pay | Admitting: Vascular Surgery

## 2015-12-06 ENCOUNTER — Encounter: Payer: Self-pay | Admitting: Vascular Surgery

## 2015-12-06 ENCOUNTER — Ambulatory Visit (INDEPENDENT_AMBULATORY_CARE_PROVIDER_SITE_OTHER): Payer: Medicare Other | Admitting: Vascular Surgery

## 2015-12-06 VITALS — BP 127/72 | HR 101 | Temp 97.3°F | Resp 16 | Ht 64.0 in | Wt 145.0 lb

## 2015-12-06 DIAGNOSIS — I739 Peripheral vascular disease, unspecified: Secondary | ICD-10-CM

## 2015-12-06 DIAGNOSIS — Z48812 Encounter for surgical aftercare following surgery on the circulatory system: Secondary | ICD-10-CM

## 2015-12-06 NOTE — Progress Notes (Signed)
Patient name: [name] [name] MRN: [id number] DOB: [birth year] Sex: female  REASON FOR VISIT: Follow up after left femoropopliteal bypass.  HPI: [name] [name] is a 65 y.o. female who had presented with a nonhealing wound of her left foot and critical limb ischemia. She had diffuse multilevel arterial occlusive disease. She underwent atherectomy and angioplasty and stenting of the left common iliac artery and external iliac artery. Below that she had occlusion of the common femoral artery. She underwent left common femoral artery endarterectomy and profundoplasty with vein patch angioplasty but I was unable to get adequate inflow and therefore, Dr. Nanetta Batty performed angioplasty and stenting of the external iliac artery from a brachial approach. She was taken back to the operating room on 11/02/2015 and underwent a left femoral to below-knee popliteal artery bypass with a vein graft. Dr. Lajoyce Corners has been managing her left foot wound. She comes in for her first outpatient visit after her femoropopliteal bypass.  She is currently at [facility] skilled nursing facility. She is making excellent progress. She denies any pain in her left leg.  Current Outpatient Prescriptions  Medication Sig Dispense Refill  . Amino Acids-Protein Hydrolys (FEEDING SUPPLEMENT, PRO-STAT SUGAR FREE 64,) LIQD Take 30 mLs by mouth 2 (two) times daily. 900 mL 0  . amLODipine (NORVASC) 5 MG tablet TAKE 1 TABLET(5 MG) BY MOUTH DAILY 30 tablet 11  . aspirin EC 81 MG tablet Take 81 mg by mouth daily.    Marland Kitchen atorvastatin (LIPITOR) 80 MG tablet Take 1 tablet (80 mg total) by mouth daily. 90 tablet 1  . cefTRIAXone 2 g in dextrose 5 % 50 mL Inject 2 g into the vein daily. 40 Dose 0  . clopidogrel (PLAVIX) 75 MG tablet Take 1 tablet (75 mg total) by mouth daily. Holding for vascular surgery, restart as soon as possible after leg surgery (after 8/8) (Patient taking differently: Take 75 mg by mouth daily. ) 90 tablet 3  . doxycycline  (VIBRA-TABS) 100 MG tablet Take 100 mg by mouth 2 (two) times daily. Due to positive culture of strep B left foot wound    . furosemide (LASIX) 20 MG tablet Take 1 tablet (20 mg total) by mouth daily as needed for fluid or edema (shortness of breath). 30 tablet 0  . gabapentin (NEURONTIN) 300 MG capsule Take 300 mg by mouth 3 (three) times daily.     . Insulin Glargine (LANTUS) 100 UNIT/ML Solostar Pen Inject 35 Units into the skin daily at 10 pm.     . isosorbide mononitrate (IMDUR) 60 MG 24 hr tablet Take 90 mg by mouth daily.     Marland Kitchen losartan (COZAAR) 100 MG tablet TAKE 1 TABLET BY MOUTH EVERY DAY 30 tablet 11  . metFORMIN (GLUCOPHAGE) 1000 MG tablet Take 1,000 mg by mouth 2 (two) times daily with a meal.    . metoprolol succinate (TOPROL-XL) 50 MG 24 hr tablet Take 1 tablet (50 mg total) by mouth daily. 30 tablet 2  . pantoprazole (PROTONIX) 40 MG tablet Take 1 tablet (40 mg total) by mouth daily. 30 tablet 10  . polyethylene glycol (MIRALAX / GLYCOLAX) packet Take 17 g by mouth daily as needed. 14 each 0  . ketoconazole (NIZORAL) 2 % cream APP EXT AA BID UNTIL GONE  1  . oxyCODONE-acetaminophen (PERCOCET/ROXICET) 5-325 MG tablet Take 2 tablets by mouth every 6 (six) hours as needed for moderate pain. (Patient not taking: Reported on 12/06/2015) 15 tablet 0   No current  facility-administered medications for this visit.     REVIEW OF SYSTEMS:  [X]  denotes positive finding, [ ]  denotes negative finding Cardiac  Comments:  Chest pain or chest pressure:    Shortness of breath upon exertion:    Short of breath when lying flat:    Irregular heart rhythm:    Constitutional    Fever or chills:      PHYSICAL EXAM: Vitals:   12/06/15 1151  BP: 127/72  Pulse: (!) 101  Resp: 16  Temp: 97.3 F (36.3 C)  SpO2: 98%  Weight: 145 lb (65.8 kg)  Height: 5\' 4"  (1.626 m)    GENERAL: The patient is a well-nourished female, in no acute distress. The vital signs are documented  above. CARDIOVASCULAR: There is a regular rate and rhythm. PULMONARY: There is good air exchange bilaterally without wheezing or rales. She has a palpable left popliteal pulse. Her incisions are all healing nicely. She has a brisk anterior tibial signal at the Doppler. This was the only signal that I could really assess because of her VAC dressing.  MEDICAL ISSUES:  STATUS POST LEFT FEMORAL TO BELOW-KNEE POPLITEAL ARTERY BYPASS WITH VEIN: The patient is doing well status post left femoropopliteal bypass grafting. Dr. Lajoyce Cornersuda is managing the left foot and she currently has a negative pressure dressing on this. She'll continue to follow her peripheral vascular disease with Dr. Nanetta BattyJonathan Berry. I have ordered a graft duplex in our office in 3 months to follow her bypass graft. She knows to call sooner if she has problems.  Waverly Ferrariickson, [name] [name] [facility] [place] [facility] [phone]

## 2015-12-06 NOTE — Telephone Encounter (Signed)
Called spoke with Baird Lyons at [facility] to advise what Dr. Audrie Lia discharge instructions stated. Leave wound VAC in place for 1 week. Then remove wound VAC and start Silvadene plus dry dressing changes daily.

## 2015-12-08 ENCOUNTER — Non-Acute Institutional Stay (SKILLED_NURSING_FACILITY): Payer: Medicare Other | Admitting: Internal Medicine

## 2015-12-08 ENCOUNTER — Encounter: Payer: Self-pay | Admitting: Internal Medicine

## 2015-12-08 ENCOUNTER — Ambulatory Visit (INDEPENDENT_AMBULATORY_CARE_PROVIDER_SITE_OTHER): Payer: Medicare Other | Admitting: Family

## 2015-12-08 VITALS — Ht 64.0 in | Wt 145.0 lb

## 2015-12-08 DIAGNOSIS — E1165 Type 2 diabetes mellitus with hyperglycemia: Secondary | ICD-10-CM

## 2015-12-08 DIAGNOSIS — E118 Type 2 diabetes mellitus with unspecified complications: Secondary | ICD-10-CM

## 2015-12-08 DIAGNOSIS — M86172 Other acute osteomyelitis, left ankle and foot: Secondary | ICD-10-CM

## 2015-12-08 DIAGNOSIS — Z945 Skin transplant status: Secondary | ICD-10-CM

## 2015-12-08 DIAGNOSIS — IMO0002 Reserved for concepts with insufficient information to code with codable children: Secondary | ICD-10-CM

## 2015-12-08 DIAGNOSIS — Z794 Long term (current) use of insulin: Secondary | ICD-10-CM

## 2015-12-08 DIAGNOSIS — Z9889 Other specified postprocedural states: Secondary | ICD-10-CM

## 2015-12-08 DIAGNOSIS — Z87891 Personal history of nicotine dependence: Secondary | ICD-10-CM

## 2015-12-08 DIAGNOSIS — I739 Peripheral vascular disease, unspecified: Secondary | ICD-10-CM

## 2015-12-08 NOTE — Progress Notes (Signed)
Wound Care Note   Patient: [name] [name]           Date of Birth: 09-22-1950           MRN: [id number]             PCP: Georgann Housekeeper, MD Visit Date: 12/08/2015   Assessment & Plan: Visit Diagnoses:  1. H/O skin graft   2. PAD (peripheral artery disease) (HCC)   3. Claudication in peripheral vascular disease:  Lifestyle limiting.   4. PVD (peripheral vascular disease) with claudication (HCC)   5. Uncontrolled type 2 diabetes mellitus with complication, with long-term current use of insulin (HCC)   6. Hx of tobacco use, presenting hazards to health   7. Other acute osteomyelitis of left foot (HCC)     Plan: Continue Silvadene dressings daily. Daily wound care and wound cleansing. Remain nonweightbearing.  Follow-Up Instructions: Return in about 2 weeks (around 12/22/2015).  Orders:  No orders of the defined types were placed in this encounter.  No orders of the defined types were placed in this encounter.     Procedures: No notes on file   Clinical Data: No additional findings.   No images are attached to the encounter.   Subjective: Chief Complaint  Patient presents with  . Left Foot - Routine Post Op    12/01/15 left foot skin graft and wound vac placement     Left foot wound skin graft and wound vac application. [facility] called this morning and had concerns about the vac sponge being " stapled into the skin graft". Pt comes in with dry dressing and this was removed. The skin graft itself is what is stapled into the skin. There is silvadene ointment applied copiously to the wound. The patient has no compliants.     Review of Systems  Constitutional: Negative for chills and fever.  Skin: Positive for wound.    Objective: Vital Signs: Ht 5\' 4"  (1.626 m)   Wt 145 lb (65.8 kg)   BMI 24.89 kg/m   Physical Exam: Skin graft to plantar and lateral left foot intact. Staples in place. The plantar graft is grey causing concern to wound nurse. No drainage,  surrounding erythema or sign of infection. No surrounding maceration.  Specialty Comments: No specialty comments available.   PMFS History: Patient Active Problem List   Diagnosis Date Noted  . H/O skin graft 12/11/2015  . NSTEMI (non-ST elevated myocardial infarction) (HCC) 11/19/2015  . DM type 2 with diabetic peripheral neuropathy (HCC) 11/19/2015  . Traumatic hemorrhagic shock (HCC)   . Chronic systolic CHF (congestive heart failure) (HCC)   . Uncontrolled type 2 diabetes mellitus with complication (HCC)   . Demand myocardial infarction 11/08/2015  . Surgery, elective   . Central line infection, initial encounter   . Septic shock (HCC) 11/03/2015  . Encephalopathy acute 11/03/2015  . Acute blood loss anemia 11/03/2015  . Acute respiratory failure with hypoxia (HCC)   . CVA (cerebral vascular accident) (HCC)   . S/P femoral-popliteal bypass surgery   . Malnutrition of moderate degree 11/01/2015  . Diabetic ulcer of left midfoot associated with diabetes mellitus due to underlying condition, with muscle involvement without evidence of necrosis (HCC)   . Hypertensive heart disease with chronic diastolic congestive heart failure (HCC) 10/31/2015  . Osteomyelitis of left foot (HCC) 10/30/2015  . PVD (peripheral vascular disease) with claudication (HCC) 09/28/2015  . Critical lower limb ischemia 08/28/2015  . Hyperlipidemia with target LDL less than  70 06/28/2013  . Diabetes mellitus type II, uncontrolled (HCC) 03/03/2013  . Complicated migraine 03/03/2013  . Stroke-like symptom 03/02/2013  . Diabetes mellitus (HCC) 03/02/2013  . Aphasia 03/02/2013  . Headache(784.0) 03/02/2013  . PAD (peripheral artery disease) (HCC) 06/05/2011  . Coronary artery disease involving native coronary artery of native heart without angina pectoris 06/05/2011  . Hypotension 06/05/2011  . Claudication in peripheral vascular disease:  Lifestyle limiting. 06/05/2011  . Hx of tobacco use, presenting  hazards to health 06/05/2011   Past Medical History:  Diagnosis Date  . Anemia 10/2015   Acute Blood Loss  . Arthritis    "feel like I have it all over" (08/28/2015)  . CHF (congestive heart failure) (HCC)   . Complication of anesthesia    DIFFICULT WAKING "only when I was smoking; no problems since I quit"  . Coronary artery disease   . Family history of adverse reaction to anesthesia    sister slow to wake up  . GERD (gastroesophageal reflux disease)    takes Protonix daily   . Hip bursitis   . History of blood transfusion    10/2015  . Hyperlipidemia LDL goal < 70 06/28/2013   takes Atorvastatin daily  . Hypertension    takes Metoprolol and Imdur daily  . Malnutrition (HCC)   . Migraine    "none in a long time" (08/28/2015)  . Myocardial infarction 2011  . Neuromuscular disorder (HCC)    DIABETIC NEUROPATHY  . Osteomyelitis (HCC) 2017   Left foot  . PAD (peripheral artery disease) (HCC)   . Peripheral vascular disease (HCC)   . Respiratory failure (HCC) 10/2015   Acute Hypoxia- acute pulmonary edema 11/13/2015  . Septic shock (HCC) 10/2015  . Stroke (HCC)   . Type II diabetes mellitus (HCC)    takes Lantus nightly.Average fasting blood sugar runs 80-90  Type II    Family History  Problem Relation [name] of Onset  . Hypertension Mother   . Heart failure Mother   . Heart failure Father   . Stroke Father   . Diabetes Father    Past Surgical History:  Procedure Laterality Date  . ABDOMINAL HYSTERECTOMY    . APPENDECTOMY    . ATHERECTOMY N/A 06/04/2011   Procedure: ATHERECTOMY;  Surgeon: Runell GessJonathan J Berry, MD;  [facility];  Service: Cardiovascular;  Laterality: N/A;  . CARDIAC CATHETERIZATION  10/13/2009   95% stenosis in the AV groove circumflex and 95% ostial stenosis in small OM3. A 3x6228mm drug-eluting Promus stent inserted ito the circumflex. Dilatated with a 3.25x2712mm noncompliant Quantum balloon within entire segment. The entire region was reduced to 0%  and brisk TIMI3 flow.  . CAROTID DUPLEX  03/19/2011   Right ICA-demonstrates complete occlusion. Left ICA-demonstrates a small amount of fibrous plaque.  Marland Kitchen. CATARACT EXTRACTION W/ INTRAOCULAR LENS IMPLANT Right   . CESAREAN SECTION  1990  . CORONARY ANGIOPLASTY    . ENDARTERECTOMY FEMORAL Left 09/05/2015   Procedure: ENDARTERECTOMY FEMORAL WITH PROFUNDOPLASTY;  Surgeon: Chuck Hinthristopher S Dickson, MD;  [facility];  Service: Vascular;  Laterality: Left;  Left common femoral artery vein patch using left saphenous vien  . FEMORAL-POPLITEAL BYPASS GRAFT Left 11/02/2015   Procedure: BYPASS GRAFT FEMORAL-POPLITEAL ARTERY VS FEMORAL-TIBIAL ARTERY BYPASS;  Surgeon: Chuck Hinthristopher S Dickson, MD;  [facility];  Service: Vascular;  Laterality: Left;  . I&D EXTREMITY Left 11/10/2015   Procedure: Debridement Left Foot Ulcer, Application  Wound VAC;  Surgeon: Nadara MustardMarcus V Duda, MD;  Location:  [facility] OR;  Service: Orthopedics;  Laterality: Left;  . ILIAC ARTERY STENT Left 08/28/2015   common  . INTRAOPERATIVE ARTERIOGRAM Left 09/05/2015   Procedure: INTRA OPERATIVE ARTERIOGRAM;  Surgeon: Chuck Hint, MD;  Location[facility] OR;  Service: Vascular;  Laterality: Left;  . INTRAOPERATIVE ARTERIOGRAM Left 11/02/2015   Procedure: INTRA OPERATIVE ARTERIOGRAM;  Surgeon: Chuck Hint, MD;  Location: [facility] OR;  Service: Vascular;  Laterality: Left;  . LEXISCAN MYOVIEW  10/25/2010   Moderate perfusion defect due to infarct/scar with mild perinfarct ischemia seen in the Basal Inferolateral, Basal Anterolateral, Mid Inferolateral, and Mid Anterolateral regions. Post-stress EF is 50%.  . OVARY SURGERY  1983?   "ruptured"  . PERIPHERAL VASCULAR ANGIOGRAM  01/26/2010   High-grade SFA disease: left greater than right. Left SFA would require fem-pop bypass grafting. Right SFA could be stented but might require Diamondback Orbital atherectomy.  Marland Kitchen PERIPHERAL VASCULAR ANGIOGRAM  02/23/2010   Stealth Predator orbital rotational  atherectomy performed on SFA & Popliteal up to 90,000 RPM. Stenting using overlapping 5x123mm and 5x89mm Absolute Pro Nitinol self-expanding stents beginning just at the knee up to the mid SFA resulting in reduction of 90-95% calcified SFA & Popliteal stenosis to 0. Stenting performed on the distal common & proximal iliac artery with a 10x4 Absolute Pro- 70-0%.  Marland Kitchen PERIPHERAL VASCULAR ANGIOGRAM  06/17/2010   PTA performed to the right external iliac artery stent using a 5x100 balloon at 10 atmospheres. Stenting performed using a 6x18 Genesis on Opta balloon. Postdilatation with a 7x2 balloon resulting in a 95% "in-stent" stenosis to 0% residual.  . PERIPHERAL VASCULAR ANGIOGRAM  06/04/2011   Bilateral total SFAs not percutaneously addressable. Good canidate for femoropopliteal bypass grafting  . PERIPHERAL VASCULAR ANGIOGRAM  08/28/2015  . PERIPHERAL VASCULAR CATHETERIZATION N/A 08/28/2015   Procedure: Lower Extremity Angiography;  Surgeon: Runell Gess, MD;  [facility]: [facility] INVASIVE CV LAB;  Service: Cardiovascular;  Laterality: N/A;  . PERIPHERAL VASCULAR CATHETERIZATION N/A 08/28/2015   Procedure: Abdominal Aortogram;  Surgeon: Runell Gess, MD;  Location: [facility] INVASIVE CV LAB;  Service: Cardiovascular;  Laterality: N/A;  . PERIPHERAL VASCULAR CATHETERIZATION Left 08/28/2015   Procedure: Peripheral Vascular Intervention;  Surgeon: Runell Gess, MD;  Location: [facility] INVASIVE CV LAB;  Service: Cardiovascular;  Laterality: Left;  common iliac  . PERIPHERAL VASCULAR CATHETERIZATION Left 08/28/2015   Procedure: Peripheral Vascular Atherectomy;  Surgeon: Runell Gess, MD;  Location: [facility] INVASIVE CV LAB;  Service: Cardiovascular;  Laterality: Left;  common iliac  . PERIPHERAL VASCULAR CATHETERIZATION N/A 09/28/2015   Procedure: Lower Extremity Angiography;  Surgeon: Runell Gess, MD;  Location: [facility] INVASIVE CV LAB;  Service: Cardiovascular;  Laterality: N/A;  . PERIPHERAL VASCULAR CATHETERIZATION Left  09/28/2015   Procedure: Peripheral Vascular Intervention;  Surgeon: Runell Gess, MD;  Location: [facility] INVASIVE CV LAB;  Service: Cardiovascular;  Laterality: Left CFA  PCI with 9 mm x 4 cm Abbott nitinol absolute Pro self-expanding stent     . SKIN SPLIT GRAFT Left 12/01/2015   Procedure: LEFT FOOT SKIN GRAFT AND VAC;  Surgeon: [name], MD;  Location: [facility] OR;  Service: Orthopedics;  Laterality: Left;  . TRANSTHORACIC ECHOCARDIOGRAM  10/17/2009   EF 45-50%, moderate hypokinesis of the entire inferolateral myocardium, mild concentric hypertrophy and mild regurg of the mitral valva.  Marland Kitchen VEIN HARVEST Left 11/02/2015   Procedure: LEFT GREATER SAPHENOUS VEIN HARVEST;  Surgeon: Chuck Hint, MD;  Location: [facility] OR;  Service: Vascular;  Laterality: Left;  Social History   Occupational History  . retired    Social History Main Topics  . Smoking status: Former Smoker    Packs/day: 1.50    Years: 41.00    Types: Cigarettes    Quit date: 10/12/2009  . Smokeless tobacco: Never Used  . Alcohol use No  . Drug use: No  . Sexual activity: Not Currently    Birth control/ protection: Surgical

## 2015-12-08 NOTE — Progress Notes (Signed)
Location:  [facility] Room Number: 109P Place of Service:  SNF (31)  Randon Goldsmith. Lyn Hollingshead, MD  Patient Care Team: Georgann Housekeeper, MD as PCP - General (Internal Medicine) Runell Gess, MD as Consulting Physician (Cardiology)  Extended Emergency Contact Information Primary Emergency Contact: Eugenio Hoes [place] Mobile Phone: [phone] Relation: Daughter    Allergies: Hydrochlorothiazide; Latex; and Penicillins  Chief Complaint  Patient presents with  . Acute Visit    Acute    HPI: Patient is 65 y.o. female who is being seen acutely because the wound care nurse would like me to see pt's foot that Dr Lajoyce Corners operated upon. Lora Havens has already called the ortho office.  Past Medical History:  Diagnosis Date  . Anemia 10/2015   Acute Blood Loss  . Arthritis    "feel like I have it all over" (08/28/2015)  . CHF (congestive heart failure) (HCC)   . Complication of anesthesia    DIFFICULT WAKING "only when I was smoking; no problems since I quit"  . Coronary artery disease   . Family history of adverse reaction to anesthesia    sister slow to wake up  . GERD (gastroesophageal reflux disease)    takes Protonix daily   . Hip bursitis   . History of blood transfusion    10/2015  . Hyperlipidemia LDL goal < 70 06/28/2013   takes Atorvastatin daily  . Hypertension    takes Metoprolol and Imdur daily  . Malnutrition (HCC)   . Migraine    "none in a long time" (08/28/2015)  . Myocardial infarction 2011  . Neuromuscular disorder (HCC)    DIABETIC NEUROPATHY  . Osteomyelitis (HCC) 2017   Left foot  . PAD (peripheral artery disease) (HCC)   . Peripheral vascular disease (HCC)   . Respiratory failure (HCC) 10/2015   Acute Hypoxia- acute pulmonary edema 11/13/2015  . Septic shock (HCC) 10/2015  . Stroke (HCC)   . Type II diabetes mellitus (HCC)    takes Lantus nightly.Average fasting blood sugar runs 80-90  Type II    Past  Surgical History:  Procedure Laterality Date  . ABDOMINAL HYSTERECTOMY    . APPENDECTOMY    . ATHERECTOMY N/A 06/04/2011   Procedure: ATHERECTOMY;  Surgeon: Runell Gess, MD;  Location[facility] CATH LAB;  Service: Cardiovascular;  Laterality: N/A;  . CARDIAC CATHETERIZATION  10/13/2009   95% stenosis in the AV groove circumflex and 95% ostial stenosis in small OM3. A 3x52mm drug-eluting Promus stent inserted ito the circumflex. Dilatated with a 3.25x53mm noncompliant Quantum balloon within entire segment. The entire region was reduced to 0% and brisk TIMI3 flow.  . CAROTID DUPLEX  03/19/2011   Right ICA-demonstrates complete occlusion. Left ICA-demonstrates a small amount of fibrous plaque.  Marland Kitchen CATARACT EXTRACTION W/ INTRAOCULAR LENS IMPLANT Right   . CESAREAN SECTION  1990  . CORONARY ANGIOPLASTY    . ENDARTERECTOMY FEMORAL Left 09/05/2015   Procedure: ENDARTERECTOMY FEMORAL WITH PROFUNDOPLASTY;  Surgeon: Chuck Hint, MD;  Location: [facility] OR;  Service: Vascular;  Laterality: Left;  Left common femoral artery vein patch using left saphenous vien  . FEMORAL-POPLITEAL BYPASS GRAFT Left 11/02/2015   Procedure: BYPASS GRAFT FEMORAL-POPLITEAL ARTERY VS FEMORAL-TIBIAL ARTERY BYPASS;  Surgeon: Chuck Hint, MD;  Location: [facility] OR;  Service: Vascular;  Laterality: Left;  . I&D EXTREMITY Left 11/10/2015   Procedure: Debridement Left Foot Ulcer, Application  Wound VAC;  Surgeon: [name], MD;  Location:  [facility] OR;  Service: Orthopedics;  Laterality: Left;  . ILIAC ARTERY STENT Left 08/28/2015   common  . INTRAOPERATIVE ARTERIOGRAM Left 09/05/2015   Procedure: INTRA OPERATIVE ARTERIOGRAM;  Surgeon: Chuck Hint, MD;  Location: [facility] OR;  Service: Vascular;  Laterality: Left;  . INTRAOPERATIVE ARTERIOGRAM Left 11/02/2015   Procedure: INTRA OPERATIVE ARTERIOGRAM;  Surgeon: Chuck Hint, MD;  Location: [facility] OR;  Service: Vascular;  Laterality: Left;  . LEXISCAN MYOVIEW  10/25/2010    Moderate perfusion defect due to infarct/scar with mild perinfarct ischemia seen in the Basal Inferolateral, Basal Anterolateral, Mid Inferolateral, and Mid Anterolateral regions. Post-stress EF is 50%.  . OVARY SURGERY  1983?   "ruptured"  . PERIPHERAL VASCULAR ANGIOGRAM  01/26/2010   High-grade SFA disease: left greater than right. Left SFA would require fem-pop bypass grafting. Right SFA could be stented but might require Diamondback Orbital atherectomy.  Marland Kitchen PERIPHERAL VASCULAR ANGIOGRAM  02/23/2010   Stealth Predator orbital rotational atherectomy performed on SFA & Popliteal up to 90,000 RPM. Stenting using overlapping 5x110mm and 5x30mm Absolute Pro Nitinol self-expanding stents beginning just at the knee up to the mid SFA resulting in reduction of 90-95% calcified SFA & Popliteal stenosis to 0. Stenting performed on the distal common & proximal iliac artery with a 10x4 Absolute Pro- 70-0%.  Marland Kitchen PERIPHERAL VASCULAR ANGIOGRAM  06/17/2010   PTA performed to the right external iliac artery stent using a 5x100 balloon at 10 atmospheres. Stenting performed using a 6x18 Genesis on Opta balloon. Postdilatation with a 7x2 balloon resulting in a 95% "in-stent" stenosis to 0% residual.  . PERIPHERAL VASCULAR ANGIOGRAM  06/04/2011   Bilateral total SFAs not percutaneously addressable. Good canidate for femoropopliteal bypass grafting  . PERIPHERAL VASCULAR ANGIOGRAM  08/28/2015  . PERIPHERAL VASCULAR CATHETERIZATION N/A 08/28/2015   Procedure: Lower Extremity Angiography;  Surgeon: Runell Gess, MD;  Location: [facility] INVASIVE CV LAB;  Service: Cardiovascular;  Laterality: N/A;  . PERIPHERAL VASCULAR CATHETERIZATION N/A 08/28/2015   Procedure: Abdominal Aortogram;  Surgeon: Runell Gess, MD;  Location: [facility] INVASIVE CV LAB;  Service: Cardiovascular;  Laterality: N/A;  . PERIPHERAL VASCULAR CATHETERIZATION Left 08/28/2015   Procedure: Peripheral Vascular Intervention;  Surgeon: Runell Gess, MD;  Location: [facility]  INVASIVE CV LAB;  Service: Cardiovascular;  Laterality: Left;  common iliac  . PERIPHERAL VASCULAR CATHETERIZATION Left 08/28/2015   Procedure: Peripheral Vascular Atherectomy;  Surgeon: Runell Gess, MD;  Location: [facility] INVASIVE CV LAB;  Service: Cardiovascular;  Laterality: Left;  common iliac  . PERIPHERAL VASCULAR CATHETERIZATION N/A 09/28/2015   Procedure: Lower Extremity Angiography;  Surgeon: Runell Gess, MD;  Location: [facility] INVASIVE CV LAB;  Service: Cardiovascular;  Laterality: N/A;  . PERIPHERAL VASCULAR CATHETERIZATION Left 09/28/2015   Procedure: Peripheral Vascular Intervention;  Surgeon: Runell Gess, MD;  Location: [facility] INVASIVE CV LAB;  Service: Cardiovascular;  Laterality: Left CFA  PCI with 9 mm x 4 cm Abbott nitinol absolute Pro self-expanding stent     . SKIN SPLIT GRAFT Left 12/01/2015   Procedure: LEFT FOOT SKIN GRAFT AND VAC;  Surgeon: [name], MD;  Location: [facility] OR;  Service: Orthopedics;  Laterality: Left;  . TRANSTHORACIC ECHOCARDIOGRAM  10/17/2009   EF 45-50%, moderate hypokinesis of the entire inferolateral myocardium, mild concentric hypertrophy and mild regurg of the mitral valva.  Marland Kitchen VEIN HARVEST Left 11/02/2015   Procedure: LEFT GREATER SAPHENOUS VEIN HARVEST;  Surgeon: Chuck Hint, MD;  Location: [facility] OR;  Service: Vascular;  Laterality: Left;  Medication List       Accurate as of 12/08/15 12:22 PM. Always use your most recent med list.          amLODipine 5 MG tablet Commonly known as:  NORVASC TAKE 1 TABLET(5 MG) BY MOUTH DAILY   aspirin EC 81 MG tablet Take 81 mg by mouth daily.   atorvastatin 80 MG tablet Commonly known as:  LIPITOR Take 1 tablet (80 mg total) by mouth daily.   cefTRIAXone 2 g in dextrose 5 % 50 mL Inject 2 g into the vein daily.   clopidogrel 75 MG tablet Commonly known as:  PLAVIX Take 1 tablet (75 mg total) by mouth daily. Holding for vascular surgery, restart as soon as possible after leg surgery (after  8/8)   doxycycline 100 MG tablet Commonly known as:  VIBRA-TABS Take 100 mg by mouth 2 (two) times daily. Due to positive culture of strep B left foot wound   feeding supplement (PRO-STAT SUGAR FREE 64) Liqd Take 30 mLs by mouth 2 (two) times daily.   furosemide 20 MG tablet Commonly known as:  LASIX Take 1 tablet (20 mg total) by mouth daily as needed for fluid or edema (shortness of breath).   gabapentin 300 MG capsule Commonly known as:  NEURONTIN Take 300 mg by mouth 3 (three) times daily.   Insulin Glargine 100 UNIT/ML Solostar Pen Commonly known as:  LANTUS Inject 35 Units into the skin daily at 10 pm.   isosorbide mononitrate 60 MG 24 hr tablet Commonly known as:  IMDUR 60 mg. Take 1 tablet by mouth along with 30 mg to equal 90 mg daily   isosorbide mononitrate 30 MG 24 hr tablet Commonly known as:  IMDUR 30 mg. Take 1 tablet along by mouth with 60 mg to equal 90 mg daily.   ketoconazole 2 % cream Commonly known as:  NIZORAL Apply 1 application topically daily. Apply daily to lower extremities   losartan 100 MG tablet Commonly known as:  COZAAR TAKE 1 TABLET BY MOUTH EVERY DAY   metFORMIN 1000 MG tablet Commonly known as:  GLUCOPHAGE Take 1,000 mg by mouth 2 (two) times daily with a meal.   metoprolol succinate 50 MG 24 hr tablet Commonly known as:  TOPROL-XL Take 1 tablet (50 mg total) by mouth daily.   oxyCODONE-acetaminophen 5-325 MG tablet Commonly known as:  PERCOCET/ROXICET Take 2 tablets by mouth every 6 (six) hours as needed for moderate pain.   pantoprazole 40 MG tablet Commonly known as:  PROTONIX Take 1 tablet (40 mg total) by mouth daily.   polyethylene glycol packet Commonly known as:  MIRALAX / GLYCOLAX Take 17 g by mouth daily as needed.       Meds ordered this encounter  Medications  . isosorbide mononitrate (IMDUR) 30 MG 24 hr tablet    Sig: 30 mg. Take 1 tablet along by mouth with 60 mg to equal 90 mg daily.  Marland Kitchen ketoconazole  (NIZORAL) 2 % cream    Sig: Apply 1 application topically daily. Apply daily to lower extremities    Immunization History  Administered Date(s) Administered  . PPD Test 11/14/2015    Social History  Substance Use Topics  . Smoking status: Former Smoker    Packs/day: 1.50    Years: 41.00    Types: Cigarettes    Quit date: 10/12/2009  . Smokeless tobacco: Never Used  . Alcohol use No    Review of Systems  DATA OBTAINED: from patient GENERAL:  no fevers, fatigue, appetite changes SKIN: wound on foot good HEENT: No complaint RESPIRATORY: No cough, wheezing, SOB CARDIAC: No chest pain, palpitations, lower extremity edema  GI: No abdominal pain, No N/V/D or constipation, No heartburn or reflux  GU: No dysuria, frequency or urgency, or incontinence  MUSCULOSKELETAL: No unrelieved bone/joint pain NEUROLOGIC: No headache, dizziness  PSYCHIATRIC: No overt anxiety or sadness  Vitals:   12/08/15 1204  BP: 122/72  Pulse: 86  Resp: 18  Temp: 97.9 F (36.6 C)   Body mass index is 24.92 kg/m. Physical Exam  GENERAL APPEARANCE: Alert, conversant, No acute distress  SKIN: L foot wound - graft is seen stapled in;also seen is a black plastic appearing flap also stapled in; no infection HEENT: Unremarkable RESPIRATORY: Breathing is even, unlabored. Lung sounds are clear   CARDIOVASCULAR: Heart RRR no murmurs, rubs or gallops. No peripheral edema  GASTROINTESTINAL: Abdomen is soft, non-tender, not distended w/ normal bowel sounds.  GENITOURINARY: Bladder non tender, not distended  MUSCULOSKELETAL: No abnormal joints or musculature NEUROLOGIC: Cranial nerves 2-12 grossly intact. Moves all extremities PSYCHIATRIC: Mood and affect appropriate to situation, no behavioral issues  Patient Active Problem List   Diagnosis Date Noted  . NSTEMI (non-ST elevated myocardial infarction) (HCC) 11/19/2015  . DM type 2 with diabetic peripheral neuropathy (HCC) 11/19/2015  . Traumatic  hemorrhagic shock (HCC)   . Chronic systolic CHF (congestive heart failure) (HCC)   . Uncontrolled type 2 diabetes mellitus with complication (HCC)   . Demand myocardial infarction 11/08/2015  . Surgery, elective   . Central line infection, initial encounter   . Septic shock (HCC) 11/03/2015  . Encephalopathy acute 11/03/2015  . Acute blood loss anemia 11/03/2015  . Acute respiratory failure with hypoxia (HCC)   . CVA (cerebral vascular accident) (HCC)   . S/P femoral-popliteal bypass surgery   . Malnutrition of moderate degree 11/01/2015  . Diabetic ulcer of left midfoot associated with diabetes mellitus due to underlying condition, with muscle involvement without evidence of necrosis (HCC)   . Hypertensive heart disease with chronic diastolic congestive heart failure (HCC) 10/31/2015  . Osteomyelitis of left foot (HCC) 10/30/2015  . PVD (peripheral vascular disease) with claudication (HCC) 09/28/2015  . Critical lower limb ischemia 08/28/2015  . Hyperlipidemia with target LDL less than 70 06/28/2013  . Diabetes mellitus type II, uncontrolled (HCC) 03/03/2013  . Complicated migraine 03/03/2013  . Stroke-like symptom 03/02/2013  . Diabetes mellitus (HCC) 03/02/2013  . Aphasia 03/02/2013  . Headache(784.0) 03/02/2013  . PAD (peripheral artery disease) (HCC) 06/05/2011  . Coronary artery disease involving native coronary artery of native heart without angina pectoris 06/05/2011  . Hypotension 06/05/2011  . Claudication in peripheral vascular disease:  Lifestyle limiting. 06/05/2011  . Hx of tobacco use, presenting hazards to health 06/05/2011    CMP     Component Value Date/Time   NA 142 12/04/2015   K 3.7 12/04/2015   CL 107 12/01/2015 1207   CO2 22 12/01/2015 1207   GLUCOSE 84 12/01/2015 1207   BUN 16 12/04/2015   CREATININE 0.5 12/04/2015   CREATININE 0.51 12/01/2015 1207   CREATININE 0.58 09/19/2015 1305   CALCIUM 8.8 (L) 12/01/2015 1207   PROT 6.7 12/01/2015 1207    ALBUMIN 2.9 (L) 12/01/2015 1207   AST 19 12/01/2015 1207   ALT 9 (L) 12/01/2015 1207   ALKPHOS 72 12/01/2015 1207   BILITOT 0.5 12/01/2015 1207   GFRNONAA >60 12/01/2015 1207   GFRAA >60 12/01/2015 1207  Recent Labs  11/02/15 2202  11/12/15 0450  11/13/15 0927  11/25/15 12/01/15 1207 12/04/15  NA  --   < > 136  < > 139  < > 141 140 142  K  --   < > 3.9  --  3.4*  < > 3.9 3.9 3.7  CL  --   < > 106  --  107  --   --  107  --   CO2  --   < > 25  --  25  --   --  22  --   GLUCOSE  --   < > 157*  --  111*  --   --  84  --   BUN  --   < > 15  < > 11  < > 13 15 16   CREATININE  --   < > 0.54  < > 0.56  < > 0.6 0.51 0.5  CALCIUM  --   < > 8.5*  --  8.8*  --   --  8.8*  --   MG 1.2*  --   --   --   --   --   --   --   --   < > = values in this interval not displayed.  Recent Labs  11/05/15 0458 11/07/15 0447 12/01/15 1207  AST 27 22 19   ALT 12* 15 9*  ALKPHOS 74 85 72  BILITOT 0.4 0.6 0.5  PROT 5.0* 5.6* 6.7  ALBUMIN 1.4* 1.5* 2.9*    Recent Labs  09/19/15 1305  10/30/15 1930  11/08/15 0530  11/12/15 0450  11/13/15 0927  11/25/15 12/01/15 1207 12/04/15  WBC 9.2  < > 13.3*  < > 11.8*  < > 10.3  < > 10.7*  < > 7.3 7.7 8.7  NEUTROABS 5,612  --  10.0*  --  8.5*  --   --   --   --   --   --   --   --   HGB 11.6*  < > 10.5*  < > 8.3*  < > 7.1*  --  9.1*  < > 10.0* 13.5 10.8*  HCT 33.7*  < > 31.2*  < > 25.0*  < > 21.8*  --  27.6*  < > 30* 40.5 32*  MCV 85.3  < > 85.0  < > 85.9  < > 88.6  --  86.5  --   --  87.5  --   PLT 417*  < > 445*  < > 448*  < > 395  --  422*  < > 297 225 257  < > = values in this interval not displayed.  Recent Labs  11/05/15 0458  CHOL 91  LDLCALC 44  TRIG 109   No results found for: [facility] Lab Results  Component Value Date   TSH 1.05 08/24/2015   Lab Results  Component Value Date   HGBA1C 7.6 (H) 11/02/2015   Lab Results  Component Value Date   CHOL 91 11/05/2015   HDL 25 (L) 11/05/2015   LDLCALC 44 11/05/2015   TRIG 109  11/05/2015   CHOLHDL 3.6 11/05/2015    Significant Diagnostic Results in last 30 days:  Dg Chest 2 View  Result Date: 12/01/2015 CLINICAL DATA:  Preop for left foot skin graft procedure. EXAM: CHEST  2 VIEW COMPARISON:  11/12/2015 FINDINGS: Right PICC line tip is in the region of the cavoatrial junction. The heart is enlarged but stable. Stable tortuosity  and calcification of the thoracic aorta. The lungs are clear. No pleural effusion. The bony thorax is intact. IMPRESSION: No acute cardiopulmonary findings. Stable cardiac enlargement and aortic atherosclerosis. Right PICC line tip in good position at the cavoatrial junction. Electronically Signed   By: Rudie Meyer M.D.   On: 12/01/2015 11:58   Dg Chest Port 1 View  Result Date: 11/12/2015 CLINICAL DATA:  Increasing shortness of breath. EXAM: PORTABLE CHEST 1 VIEW COMPARISON:  11/05/2015 FINDINGS: Cardiomegaly noted. Increasing moderate to severe bilateral pulmonary edema noted. A right IJ central venous catheter with tip overlying the lower SVC again noted. There may be trace bilateral pleural effusions present. There is no evidence of pneumothorax. IMPRESSION: Increasing moderate to severe pulmonary edema. Electronically Signed   By: Harmon Pier M.D.   On: 11/12/2015 17:31    Assessment and Plan  L FOOT SKIN GRAFT- pt and family member are concerned; we spoke for a while; Lora Havens has made appt for pt to be seen by ortho today; I reviewed Op note- could not tell from it what the black flap is (back of the skin mesh?) or from online product information    Time spent > 25 min Andrianna Manalang D. Lyn Hollingshead, MD

## 2015-12-08 NOTE — Addendum Note (Signed)
Addended by: Lorin Mercy K on: 12/08/2015 01:00 PM   Modules accepted: Orders

## 2015-12-10 ENCOUNTER — Encounter: Payer: Self-pay | Admitting: Internal Medicine

## 2015-12-11 DIAGNOSIS — Z945 Skin transplant status: Secondary | ICD-10-CM | POA: Insufficient documentation

## 2015-12-12 ENCOUNTER — Ambulatory Visit (INDEPENDENT_AMBULATORY_CARE_PROVIDER_SITE_OTHER): Payer: Medicare Other | Admitting: Orthopedic Surgery

## 2015-12-16 LAB — CBC AND DIFFERENTIAL
HCT: 36 % (ref 36–46)
Hemoglobin: 11.8 g/dL — AB (ref 12.0–16.0)
Platelets: 205 10*3/uL (ref 150–399)
WBC: 6.5 10*3/mL

## 2015-12-16 LAB — BASIC METABOLIC PANEL
BUN: 19 mg/dL (ref 4–21)
CREATININE: 0.7 mg/dL (ref 0.5–1.1)
GLUCOSE: 126 mg/dL
POTASSIUM: 3.5 mmol/L (ref 3.4–5.3)
SODIUM: 143 mmol/L (ref 137–147)

## 2015-12-18 ENCOUNTER — Ambulatory Visit (INDEPENDENT_AMBULATORY_CARE_PROVIDER_SITE_OTHER): Payer: Medicare Other | Admitting: Orthopedic Surgery

## 2015-12-18 ENCOUNTER — Encounter (INDEPENDENT_AMBULATORY_CARE_PROVIDER_SITE_OTHER): Payer: Self-pay | Admitting: Orthopedic Surgery

## 2015-12-18 VITALS — Ht 64.0 in | Wt 145.0 lb

## 2015-12-18 DIAGNOSIS — E1142 Type 2 diabetes mellitus with diabetic polyneuropathy: Secondary | ICD-10-CM

## 2015-12-18 DIAGNOSIS — L97421 Non-pressure chronic ulcer of left heel and midfoot limited to breakdown of skin: Secondary | ICD-10-CM

## 2015-12-18 NOTE — Progress Notes (Signed)
Wound Care Note   Patient: [name] [name]           Date of Birth: 01-07-1951           MRN: [id number]             PCP: Georgann HousekeeperHUSAIN,KARRAR, MD Visit Date: 12/18/2015   Assessment & Plan: Visit Diagnoses:  1. Midfoot ulcer, left, limited to breakdown of skin (HCC)   2. Diabetic polyneuropathy associated with type 2 diabetes mellitus (HCC)     Plan: Follow-up in 2 weeks continue dialysis of cleansing daily Silvadene dressing changes daily to the skin graft left foot. The staples are harvested. Patient will complete her course of ceftriaxone and her course of doxycycline. Minimize weightbearing left foot  Follow-Up Instructions: Return in about 3 weeks (around 01/08/2016).  Orders:  No orders of the defined types were placed in this encounter.  No orders of the defined types were placed in this encounter.      Procedures: No notes on file   Clinical Data: No additional findings.   No images are attached to the encounter.   Subjective: Chief Complaint  Patient presents with  . Left Foot - Routine Post Op    12/01/15 eft foot skin graft and vac application    Patient is 2 weeks and 3 days s/p a left foot skin graft application and wound vac placement. Patient is a resident at  [facility].  They are applying Silvadene ointment to the surgical site daily. She is non weight bearing in a wheelchair. No questions or concerns today.    Review of Systems  Miscellaneous:Patient is currently in skilled nursing.     Objective: Vital Signs: Ht 5\' 4"  (1.626 m)   Wt 145 lb (65.8 kg)   BMI 24.89 kg/m   Physical Exam: Examination patient has excellent granulation tissue. The wounds are healing quite nicely. There is no redness no synovitis no drainage no odor.  Specialty Comments: No specialty comments available.   PMFS History: Patient Active Problem List   Diagnosis Date Noted  . H/O skin graft 12/11/2015  . NSTEMI (non-ST elevated myocardial infarction) (HCC)  11/19/2015  . DM type 2 with diabetic peripheral neuropathy (HCC) 11/19/2015  . Traumatic hemorrhagic shock (HCC)   . Chronic systolic CHF (congestive heart failure) (HCC)   . Uncontrolled type 2 diabetes mellitus with complication (HCC)   . Demand myocardial infarction 11/08/2015  . Surgery, elective   . Central line infection, initial encounter   . Septic shock (HCC) 11/03/2015  . Encephalopathy acute 11/03/2015  . Acute blood loss anemia 11/03/2015  . Acute respiratory failure with hypoxia (HCC)   . CVA (cerebral vascular accident) (HCC)   . S/P femoral-popliteal bypass surgery   . Malnutrition of moderate degree 11/01/2015  . Diabetic ulcer of left midfoot associated with diabetes mellitus due to underlying condition, with muscle involvement without evidence of necrosis (HCC)   . Hypertensive heart disease with chronic diastolic congestive heart failure (HCC) 10/31/2015  . Osteomyelitis of left foot (HCC) 10/30/2015  . PVD (peripheral vascular disease) with claudication (HCC) 09/28/2015  . Critical lower limb ischemia 08/28/2015  . Hyperlipidemia with target LDL less than 70 06/28/2013  . Diabetes mellitus type II, uncontrolled (HCC) 03/03/2013  . Complicated migraine 03/03/2013  . Stroke-like symptom 03/02/2013  . Diabetes mellitus (HCC) 03/02/2013  . Aphasia 03/02/2013  . Headache(784.0) 03/02/2013  . PAD (peripheral artery disease) (HCC) 06/05/2011  . Coronary artery disease involving native coronary artery  of native heart without angina pectoris 06/05/2011  . Hypotension 06/05/2011  . Claudication in peripheral vascular disease:  Lifestyle limiting. 06/05/2011  . Hx of tobacco use, presenting hazards to health 06/05/2011   Past Medical History:  Diagnosis Date  . Anemia 10/2015   Acute Blood Loss  . Arthritis    "feel like I have it all over" (08/28/2015)  . CHF (congestive heart failure) (HCC)   . Complication of anesthesia    DIFFICULT WAKING "only when I was  smoking; no problems since I quit"  . Coronary artery disease   . Family history of adverse reaction to anesthesia    sister slow to wake up  . GERD (gastroesophageal reflux disease)    takes Protonix daily   . Hip bursitis   . History of blood transfusion    10/2015  . Hyperlipidemia LDL goal < 70 06/28/2013   takes Atorvastatin daily  . Hypertension    takes Metoprolol and Imdur daily  . Malnutrition (HCC)   . Migraine    "none in a long time" (08/28/2015)  . Myocardial infarction 2011  . Neuromuscular disorder (HCC)    DIABETIC NEUROPATHY  . Osteomyelitis (HCC) 2017   Left foot  . PAD (peripheral artery disease) (HCC)   . Peripheral vascular disease (HCC)   . Respiratory failure (HCC) 10/2015   Acute Hypoxia- acute pulmonary edema 11/13/2015  . Septic shock (HCC) 10/2015  . Stroke (HCC)   . Type II diabetes mellitus (HCC)    takes Lantus nightly.Average fasting blood sugar runs 80-90  Type II    Family History  Problem Relation Age of Onset  . Hypertension Mother   . Heart failure Mother   . Heart failure Father   . Stroke Father   . Diabetes Father    Past Surgical History:  Procedure Laterality Date  . ABDOMINAL HYSTERECTOMY    . APPENDECTOMY    . ATHERECTOMY N/A 06/04/2011   Procedure: ATHERECTOMY;  Surgeon: Runell Gess, MD;  Location: [facility] CATH LAB;  Service: Cardiovascular;  Laterality: N/A;  . CARDIAC CATHETERIZATION  10/13/2009   95% stenosis in the AV groove circumflex and 95% ostial stenosis in small OM3. A 3x23mm drug-eluting Promus stent inserted ito the circumflex. Dilatated with a 3.25x52mm noncompliant Quantum balloon within entire segment. The entire region was reduced to 0% and brisk TIMI3 flow.  . CAROTID DUPLEX  03/19/2011   Right ICA-demonstrates complete occlusion. Left ICA-demonstrates a small amount of fibrous plaque.  Marland Kitchen CATARACT EXTRACTION W/ INTRAOCULAR LENS IMPLANT Right   . CESAREAN SECTION  1990  . CORONARY ANGIOPLASTY    . ENDARTERECTOMY  FEMORAL Left 09/05/2015   Procedure: ENDARTERECTOMY FEMORAL WITH PROFUNDOPLASTY;  Surgeon: Chuck Hint, MD;  Location: [facility] OR;  Service: Vascular;  Laterality: Left;  Left common femoral artery vein patch using left saphenous vien  . FEMORAL-POPLITEAL BYPASS GRAFT Left 11/02/2015   Procedure: BYPASS GRAFT FEMORAL-POPLITEAL ARTERY VS FEMORAL-TIBIAL ARTERY BYPASS;  Surgeon: Chuck Hint, MD;  Location: [facility] OR;  Service: Vascular;  Laterality: Left;  . I&D EXTREMITY Left 11/10/2015   Procedure: Debridement Left Foot Ulcer, Application  Wound VAC;  Surgeon: [name], MD;  Location: [facility] OR;  Service: Orthopedics;  Laterality: Left;  . ILIAC ARTERY STENT Left 08/28/2015   common  . INTRAOPERATIVE ARTERIOGRAM Left 09/05/2015   Procedure: INTRA OPERATIVE ARTERIOGRAM;  Surgeon: Chuck Hint, MD;  Location: [facility] OR;  Service: Vascular;  Laterality: Left;  . INTRAOPERATIVE ARTERIOGRAM Left 11/02/2015  Procedure: INTRA OPERATIVE ARTERIOGRAM;  Surgeon: Chuck Hint, MD;  Location: [facility] OR;  Service: Vascular;  Laterality: Left;  . LEXISCAN MYOVIEW  10/25/2010   Moderate perfusion defect due to infarct/scar with mild perinfarct ischemia seen in the Basal Inferolateral, Basal Anterolateral, Mid Inferolateral, and Mid Anterolateral regions. Post-stress EF is 50%.  . OVARY SURGERY  1983?   "ruptured"  . PERIPHERAL VASCULAR ANGIOGRAM  01/26/2010   High-grade SFA disease: left greater than right. Left SFA would require fem-pop bypass grafting. Right SFA could be stented but might require Diamondback Orbital atherectomy.  Marland Kitchen PERIPHERAL VASCULAR ANGIOGRAM  02/23/2010   Stealth Predator orbital rotational atherectomy performed on SFA & Popliteal up to 90,000 RPM. Stenting using overlapping 5x153mm and 5x65mm Absolute Pro Nitinol self-expanding stents beginning just at the knee up to the mid SFA resulting in reduction of 90-95% calcified SFA & Popliteal stenosis to 0. Stenting performed on the  distal common & proximal iliac artery with a 10x4 Absolute Pro- 70-0%.  Marland Kitchen PERIPHERAL VASCULAR ANGIOGRAM  06/17/2010   PTA performed to the right external iliac artery stent using a 5x100 balloon at 10 atmospheres. Stenting performed using a 6x18 Genesis on Opta balloon. Postdilatation with a 7x2 balloon resulting in a 95% "in-stent" stenosis to 0% residual.  . PERIPHERAL VASCULAR ANGIOGRAM  06/04/2011   Bilateral total SFAs not percutaneously addressable. Good canidate for femoropopliteal bypass grafting  . PERIPHERAL VASCULAR ANGIOGRAM  08/28/2015  . PERIPHERAL VASCULAR CATHETERIZATION N/A 08/28/2015   Procedure: Lower Extremity Angiography;  Surgeon: Runell Gess, MD;  Location: [facility] INVASIVE CV LAB;  Service: Cardiovascular;  Laterality: N/A;  . PERIPHERAL VASCULAR CATHETERIZATION N/A 08/28/2015   Procedure: Abdominal Aortogram;  Surgeon: Runell Gess, MD;  Location: [facility] INVASIVE CV LAB;  Service: Cardiovascular;  Laterality: N/A;  . PERIPHERAL VASCULAR CATHETERIZATION Left 08/28/2015   Procedure: Peripheral Vascular Intervention;  Surgeon: Runell Gess, MD;  Location: [facility] INVASIVE CV LAB;  Service: Cardiovascular;  Laterality: Left;  common iliac  . PERIPHERAL VASCULAR CATHETERIZATION Left 08/28/2015   Procedure: Peripheral Vascular Atherectomy;  Surgeon: Runell Gess, MD;  Location[facility] INVASIVE CV LAB;  Service: Cardiovascular;  Laterality: Left;  common iliac  . PERIPHERAL VASCULAR CATHETERIZATION N/A 09/28/2015   Procedure: Lower Extremity Angiography;  Surgeon: Runell Gess, MD;  Location: [facility] INVASIVE CV LAB;  Service: Cardiovascular;  Laterality: N/A;  . PERIPHERAL VASCULAR CATHETERIZATION Left 09/28/2015   Procedure: Peripheral Vascular Intervention;  Surgeon: Runell Gess, MD;  Location: [facility] INVASIVE CV LAB;  Service: Cardiovascular;  Laterality: Left CFA  PCI with 9 mm x 4 cm Abbott nitinol absolute Pro self-expanding stent     . SKIN SPLIT GRAFT Left 12/01/2015   Procedure:  LEFT FOOT SKIN GRAFT AND VAC;  Surgeon: [name], MD;  Location: [facility] OR;  Service: Orthopedics;  Laterality: Left;  . TRANSTHORACIC ECHOCARDIOGRAM  10/17/2009   EF 45-50%, moderate hypokinesis of the entire inferolateral myocardium, mild concentric hypertrophy and mild regurg of the mitral valva.  Marland Kitchen VEIN HARVEST Left 11/02/2015   Procedure: LEFT GREATER SAPHENOUS VEIN HARVEST;  Surgeon: Chuck Hint, MD;  Location: [facility] OR;  Service: Vascular;  Laterality: Left;   Social History   Occupational History  . retired    Social History Main Topics  . Smoking status: Former Smoker    Packs/day: 1.50    Years: 41.00    Types: Cigarettes    Quit date: 10/12/2009  . Smokeless tobacco: Never Used  . Alcohol  use No  . Drug use: No  . Sexual activity: Not Currently    Birth control/ protection: Surgical

## 2015-12-20 ENCOUNTER — Non-Acute Institutional Stay (SKILLED_NURSING_FACILITY): Payer: Medicare Other | Admitting: Internal Medicine

## 2015-12-20 ENCOUNTER — Encounter: Payer: Self-pay | Admitting: Internal Medicine

## 2015-12-20 DIAGNOSIS — I739 Peripheral vascular disease, unspecified: Secondary | ICD-10-CM

## 2015-12-20 DIAGNOSIS — M86172 Other acute osteomyelitis, left ankle and foot: Secondary | ICD-10-CM

## 2015-12-20 DIAGNOSIS — Z95828 Presence of other vascular implants and grafts: Secondary | ICD-10-CM

## 2015-12-20 DIAGNOSIS — T794XXD Traumatic shock, subsequent encounter: Secondary | ICD-10-CM | POA: Diagnosis not present

## 2015-12-20 DIAGNOSIS — I70229 Atherosclerosis of native arteries of extremities with rest pain, unspecified extremity: Secondary | ICD-10-CM

## 2015-12-20 DIAGNOSIS — S7012XD Contusion of left thigh, subsequent encounter: Secondary | ICD-10-CM | POA: Diagnosis not present

## 2015-12-20 DIAGNOSIS — I639 Cerebral infarction, unspecified: Secondary | ICD-10-CM | POA: Diagnosis not present

## 2015-12-20 DIAGNOSIS — Z9889 Other specified postprocedural states: Secondary | ICD-10-CM

## 2015-12-20 DIAGNOSIS — E08621 Diabetes mellitus due to underlying condition with foot ulcer: Secondary | ICD-10-CM

## 2015-12-20 DIAGNOSIS — I998 Other disorder of circulatory system: Secondary | ICD-10-CM

## 2015-12-20 DIAGNOSIS — E1142 Type 2 diabetes mellitus with diabetic polyneuropathy: Secondary | ICD-10-CM

## 2015-12-20 DIAGNOSIS — L97425 Non-pressure chronic ulcer of left heel and midfoot with muscle involvement without evidence of necrosis: Secondary | ICD-10-CM

## 2015-12-20 DIAGNOSIS — D62 Acute posthemorrhagic anemia: Secondary | ICD-10-CM

## 2015-12-20 DIAGNOSIS — Z9289 Personal history of other medical treatment: Secondary | ICD-10-CM

## 2015-12-20 DIAGNOSIS — I2510 Atherosclerotic heart disease of native coronary artery without angina pectoris: Secondary | ICD-10-CM

## 2015-12-20 DIAGNOSIS — E785 Hyperlipidemia, unspecified: Secondary | ICD-10-CM

## 2015-12-20 DIAGNOSIS — I110 Hypertensive heart disease with heart failure: Secondary | ICD-10-CM

## 2015-12-20 DIAGNOSIS — IMO0002 Reserved for concepts with insufficient information to code with codable children: Secondary | ICD-10-CM

## 2015-12-20 DIAGNOSIS — I214 Non-ST elevation (NSTEMI) myocardial infarction: Secondary | ICD-10-CM

## 2015-12-20 DIAGNOSIS — I5023 Acute on chronic systolic (congestive) heart failure: Secondary | ICD-10-CM

## 2015-12-20 DIAGNOSIS — E118 Type 2 diabetes mellitus with unspecified complications: Secondary | ICD-10-CM

## 2015-12-20 DIAGNOSIS — E1165 Type 2 diabetes mellitus with hyperglycemia: Secondary | ICD-10-CM

## 2015-12-20 DIAGNOSIS — Z945 Skin transplant status: Secondary | ICD-10-CM

## 2015-12-20 DIAGNOSIS — Z794 Long term (current) use of insulin: Secondary | ICD-10-CM

## 2015-12-20 DIAGNOSIS — I5032 Chronic diastolic (congestive) heart failure: Secondary | ICD-10-CM

## 2015-12-20 NOTE — Progress Notes (Signed)
Location:  [facility]   Place of Service:  SNF (31)  PCP: Georgann Housekeeper, MD Patient Care Team: Georgann Housekeeper, MD as PCP - General (Internal Medicine) Runell Gess, MD as Consulting Physician (Cardiology)  Extended Emergency Contact Information Primary Emergency Contact: Eugenio Hoes [place] Mobile Phone: [phone] Relation: Daughter  Allergies  Allergen Reactions  . Hydrochlorothiazide Other (See Comments)    lethargic   . Latex Rash  . Penicillins Swelling and Rash    Pt states she has tolerated Keflex in the past without problems. States she may have tolerated Augmentin in the past but it caused GI upset. Has patient had a PCN reaction causing immediate rash, facial/tongue/throat swelling, SOB or lightheadedness with hypotension: Yes Has patient had a PCN reaction causing severe rash involving mucus membranes or skin necrosis: No Has patient had a PCN reaction that required hospitalization No Has patient had a PCN reaction occurring within the last 10 years: No    Chief Complaint  Patient presents with  . Discharge Note    HPI:  65 y.o. female  with diabetes mellitus, peripheral vascular disease status post revascularization, history of MI, hypertension, hyperlipidemia, peripheral neuropathy and  left foot osteomyelitis was admitted to [facility] from 10/2-17 where she underwent a fem-pop. Before she the foot could be surgically debrided pt presented with stroke symptoms and went into shock. Hb was found to be 5.8 2/2 bleeding into her thigh. She developed an NSTEMI as well. When she was transfused she went into pulmonary edema which resolved with diuresis. Pt finally underwent debridement of foot on 10/13. Pt is admitted to SNF for IV rocephin for 6 weeks (stop 12/24/2015. Doxycycline was started 110/19-11/18 after a culture from foot came back positive for strep B. Pt has been undergoing Ot/PT and is now ready to be d/c to home.    Past  Medical History:  Diagnosis Date  . Anemia 10/2015   Acute Blood Loss  . Arthritis    "feel like I have it all over" (08/28/2015)  . CHF (congestive heart failure) (HCC)   . Complication of anesthesia    DIFFICULT WAKING "only when I was smoking; no problems since I quit"  . Coronary artery disease   . Family history of adverse reaction to anesthesia    sister slow to wake up  . GERD (gastroesophageal reflux disease)    takes Protonix daily   . Hip bursitis   . History of blood transfusion    10/2015  . Hyperlipidemia LDL goal < 70 06/28/2013   takes Atorvastatin daily  . Hypertension    takes Metoprolol and Imdur daily  . Malnutrition (HCC)   . Migraine    "none in a long time" (08/28/2015)  . Myocardial infarction 2011  . Neuromuscular disorder (HCC)    DIABETIC NEUROPATHY  . Osteomyelitis (HCC) 2017   Left foot  . PAD (peripheral artery disease) (HCC)   . Peripheral vascular disease (HCC)   . Respiratory failure (HCC) 10/2015   Acute Hypoxia- acute pulmonary edema 11/13/2015  . Septic shock (HCC) 10/2015  . Stroke (HCC)   . Type II diabetes mellitus (HCC)    takes Lantus nightly.Average fasting blood sugar runs 80-90  Type II    Past Surgical History:  Procedure Laterality Date  . ABDOMINAL HYSTERECTOMY    . APPENDECTOMY    . ATHERECTOMY N/A 06/04/2011   Procedure: ATHERECTOMY;  Surgeon: Runell Gess, MD;  Location: [facility] CATH LAB;  Service:  Cardiovascular;  Laterality: N/A;  . CARDIAC CATHETERIZATION  10/13/2009   95% stenosis in the AV groove circumflex and 95% ostial stenosis in small OM3. A 3x74mm drug-eluting Promus stent inserted ito the circumflex. Dilatated with a 3.25x20mm noncompliant Quantum balloon within entire segment. The entire region was reduced to 0% and brisk TIMI3 flow.  . CAROTID DUPLEX  03/19/2011   Right ICA-demonstrates complete occlusion. Left ICA-demonstrates a small amount of fibrous plaque.  Marland Kitchen CATARACT EXTRACTION W/ INTRAOCULAR LENS IMPLANT  Right   . CESAREAN SECTION  1990  . CORONARY ANGIOPLASTY    . ENDARTERECTOMY FEMORAL Left 09/05/2015   Procedure: ENDARTERECTOMY FEMORAL WITH PROFUNDOPLASTY;  Surgeon: Chuck Hint, MD;  Location: [facility] OR;  Service: Vascular;  Laterality: Left;  Left common femoral artery vein patch using left saphenous vien  . FEMORAL-POPLITEAL BYPASS GRAFT Left 11/02/2015   Procedure: BYPASS GRAFT FEMORAL-POPLITEAL ARTERY VS FEMORAL-TIBIAL ARTERY BYPASS;  Surgeon: Chuck Hint, MD;  Location: [facility] OR;  Service: Vascular;  Laterality: Left;  . I&D EXTREMITY Left 11/10/2015   Procedure: Debridement Left Foot Ulcer, Application  Wound VAC;  Surgeon: [name], MD;  Location: [facility] OR;  Service: Orthopedics;  Laterality: Left;  . ILIAC ARTERY STENT Left 08/28/2015   common  . INTRAOPERATIVE ARTERIOGRAM Left 09/05/2015   Procedure: INTRA OPERATIVE ARTERIOGRAM;  Surgeon: Chuck Hint, MD;  Location: [facility] OR;  Service: Vascular;  Laterality: Left;  . INTRAOPERATIVE ARTERIOGRAM Left 11/02/2015   Procedure: INTRA OPERATIVE ARTERIOGRAM;  Surgeon: Chuck Hint, MD;  Location: [facility] OR;  Service: Vascular;  Laterality: Left;  . LEXISCAN MYOVIEW  10/25/2010   Moderate perfusion defect due to infarct/scar with mild perinfarct ischemia seen in the Basal Inferolateral, Basal Anterolateral, Mid Inferolateral, and Mid Anterolateral regions. Post-stress EF is 50%.  . OVARY SURGERY  1983?   "ruptured"  . PERIPHERAL VASCULAR ANGIOGRAM  01/26/2010   High-grade SFA disease: left greater than right. Left SFA would require fem-pop bypass grafting. Right SFA could be stented but might require Diamondback Orbital atherectomy.  Marland Kitchen PERIPHERAL VASCULAR ANGIOGRAM  02/23/2010   Stealth Predator orbital rotational atherectomy performed on SFA & Popliteal up to 90,000 RPM. Stenting using overlapping 5x1105mm and 5x61mm Absolute Pro Nitinol self-expanding stents beginning just at the knee up to the mid SFA resulting in  reduction of 90-95% calcified SFA & Popliteal stenosis to 0. Stenting performed on the distal common & proximal iliac artery with a 10x4 Absolute Pro- 70-0%.  Marland Kitchen PERIPHERAL VASCULAR ANGIOGRAM  06/17/2010   PTA performed to the right external iliac artery stent using a 5x100 balloon at 10 atmospheres. Stenting performed using a 6x18 Genesis on Opta balloon. Postdilatation with a 7x2 balloon resulting in a 95% "in-stent" stenosis to 0% residual.  . PERIPHERAL VASCULAR ANGIOGRAM  06/04/2011   Bilateral total SFAs not percutaneously addressable. Good canidate for femoropopliteal bypass grafting  . PERIPHERAL VASCULAR ANGIOGRAM  08/28/2015  . PERIPHERAL VASCULAR CATHETERIZATION N/A 08/28/2015   Procedure: Lower Extremity Angiography;  Surgeon: Runell Gess, MD;  Location: [facility] INVASIVE CV LAB;  Service: Cardiovascular;  Laterality: N/A;  . PERIPHERAL VASCULAR CATHETERIZATION N/A 08/28/2015   Procedure: Abdominal Aortogram;  Surgeon: Runell Gess, MD;  Location: [facility] INVASIVE CV LAB;  Service: Cardiovascular;  Laterality: N/A;  . PERIPHERAL VASCULAR CATHETERIZATION Left 08/28/2015   Procedure: Peripheral Vascular Intervention;  Surgeon: Runell Gess, MD;  Location: [facility] INVASIVE CV LAB;  Service: Cardiovascular;  Laterality: Left;  common iliac  . PERIPHERAL VASCULAR CATHETERIZATION Left  08/28/2015   Procedure: Peripheral Vascular Atherectomy;  Surgeon: Runell Gess, MD;  Location: [facility] INVASIVE CV LAB;  Service: Cardiovascular;  Laterality: Left;  common iliac  . PERIPHERAL VASCULAR CATHETERIZATION N/A 09/28/2015   Procedure: Lower Extremity Angiography;  Surgeon: Runell Gess, MD;  Location: [facility] INVASIVE CV LAB;  Service: Cardiovascular;  Laterality: N/A;  . PERIPHERAL VASCULAR CATHETERIZATION Left 09/28/2015   Procedure: Peripheral Vascular Intervention;  Surgeon: Runell Gess, MD;  Location: [facility] INVASIVE CV LAB;  Service: Cardiovascular;  Laterality: Left CFA  PCI with 9 mm x 4 cm Abbott nitinol  absolute Pro self-expanding stent     . SKIN SPLIT GRAFT Left 12/01/2015   Procedure: LEFT FOOT SKIN GRAFT AND VAC;  Surgeon: [name], MD;  Location: [facility] OR;  Service: Orthopedics;  Laterality: Left;  . TRANSTHORACIC ECHOCARDIOGRAM  10/17/2009   EF 45-50%, moderate hypokinesis of the entire inferolateral myocardium, mild concentric hypertrophy and mild regurg of the mitral valva.  Marland Kitchen VEIN HARVEST Left 11/02/2015   Procedure: LEFT GREATER SAPHENOUS VEIN HARVEST;  Surgeon: Chuck Hint, MD;  Location: [facility] OR;  Service: Vascular;  Laterality: Left;     reports that she quit smoking about 6 years ago. Her smoking use included Cigarettes. She has a 61.50 pack-year smoking history. She has never used smokeless tobacco. She reports that she does not drink alcohol or use drugs. Social History   Social History  . Marital status: Married    Spouse name: N/A  . Number of children: N/A  . Years of education: N/A   Occupational History  . retired    Social History Main Topics  . Smoking status: Former Smoker    Packs/day: 1.50    Years: 41.00    Types: Cigarettes    Quit date: 10/12/2009  . Smokeless tobacco: Never Used  . Alcohol use No  . Drug use: No  . Sexual activity: Not Currently    Birth control/ protection: Surgical   Other Topics Concern  . Not on file   Social History Narrative  . No narrative on file    Pertinent  Health Maintenance Due  Topic Date Due  . FOOT EXAM  03/27/1960  . OPHTHALMOLOGY EXAM  03/27/1960  . PAP SMEAR  03/28/1971  . COLONOSCOPY  03/27/2000  . DEXA SCAN  03/28/2015  . HEMOGLOBIN A1C  05/02/2016  . PNA vac Low Risk Adult (2 of 2 - PPSV23) 09/28/2016  . MAMMOGRAM  04/11/2017  . INFLUENZA VACCINE  Completed    Medications:   Medication List       Accurate as of 12/20/15 11:59 PM. Always use your most recent med list.          amLODipine 5 MG tablet Commonly known as:  NORVASC TAKE 1 TABLET(5 MG) BY MOUTH DAILY   aspirin EC 81  MG tablet Take 81 mg by mouth daily.   atorvastatin 80 MG tablet Commonly known as:  LIPITOR Take 1 tablet (80 mg total) by mouth daily.   cefTRIAXone 2 g in dextrose 5 % 50 mL Inject 2 g into the vein daily.   clopidogrel 75 MG tablet Commonly known as:  PLAVIX Take 1 tablet (75 mg total) by mouth daily. Holding for vascular surgery, restart as soon as possible after leg surgery (after 8/8)   feeding supplement (PRO-STAT SUGAR FREE 64) Liqd Take 30 mLs by mouth 2 (two) times daily.   furosemide 20 MG tablet Commonly known as:  LASIX Take 1  tablet (20 mg total) by mouth daily as needed for fluid or edema (shortness of breath).   gabapentin 300 MG capsule Commonly known as:  NEURONTIN Take 300 mg by mouth 3 (three) times daily.   Insulin Glargine 100 UNIT/ML Solostar Pen Commonly known as:  LANTUS Inject 25 Units into the skin daily at 10 pm.   isosorbide mononitrate 60 MG 24 hr tablet Commonly known as:  IMDUR 60 mg. Take 1 tablet by mouth along with 30 mg to equal 90 mg daily   isosorbide mononitrate 30 MG 24 hr tablet Commonly known as:  IMDUR 30 mg. Take 1 tablet along by mouth with 60 mg to equal 90 mg daily.   ketoconazole 2 % cream Commonly known as:  NIZORAL Apply 1 application topically daily. Apply daily to lower extremities   losartan 100 MG tablet Commonly known as:  COZAAR TAKE 1 TABLET BY MOUTH EVERY DAY   metFORMIN 1000 MG tablet Commonly known as:  GLUCOPHAGE Take 1,000 mg by mouth 2 (two) times daily with a meal.   metoprolol succinate 50 MG 24 hr tablet Commonly known as:  TOPROL-XL Take 1 tablet (50 mg total) by mouth daily.   oxyCODONE-acetaminophen 5-325 MG tablet Commonly known as:  PERCOCET/ROXICET Take 2 tablets by mouth every 6 (six) hours as needed for moderate pain.   pantoprazole 40 MG tablet Commonly known as:  PROTONIX Take 1 tablet (40 mg total) by mouth daily.   polyethylene glycol packet Commonly known as:  MIRALAX /  GLYCOLAX Take 17 g by mouth daily as needed.        Vitals:   12/20/15 1745  BP: (!) 91/57  Pulse: 72  Resp: (!) 72  Temp: 97.1 F (36.2 C)   There is no height or weight on file to calculate BMI.  Physical Exam  GENERAL APPEARANCE: Alert, conversant. No acute distress.  HEENT: Unremarkable. RESPIRATORY: Breathing is even, unlabored. Lung sounds are clear   CARDIOVASCULAR: Heart RRR no murmurs, rubs or gallops. No peripheral edema.  GASTROINTESTINAL: Abdomen is soft, non-tender, not distended w/ normal bowel sounds.  NEUROLOGIC: Cranial nerves 2-12 grossly intact. Moves all extremities   Labs reviewed: Basic Metabolic Panel:  Recent Labs  78/29/5608/06/13 2202  11/12/15 0450  11/13/15 0927  11/25/15 12/01/15 1207 12/04/15  NA  --   < > 136  < > 139  < > 141 140 142  K  --   < > 3.9  --  3.4*  < > 3.9 3.9 3.7  CL  --   < > 106  --  107  --   --  107  --   CO2  --   < > 25  --  25  --   --  22  --   GLUCOSE  --   < > 157*  --  111*  --   --  84  --   BUN  --   < > 15  < > 11  < > 13 15 16   CREATININE  --   < > 0.54  < > 0.56  < > 0.6 0.51 0.5  CALCIUM  --   < > 8.5*  --  8.8*  --   --  8.8*  --   MG 1.2*  --   --   --   --   --   --   --   --   < > = values in this interval not displayed. No  results found for: [facility] Liver Function Tests:  Recent Labs  11/05/15 0458 11/07/15 0447 12/01/15 1207  AST 27 22 19   ALT 12* 15 9*  ALKPHOS 74 85 72  BILITOT 0.4 0.6 0.5  PROT 5.0* 5.6* 6.7  ALBUMIN 1.4* 1.5* 2.9*   No results for input(s): LIPASE, AMYLASE in the last 8760 hours. No results for input(s): AMMONIA in the last 8760 hours. CBC:  Recent Labs  09/19/15 1305  10/30/15 1930  11/08/15 0530  11/12/15 0450  11/13/15 0927  11/25/15 12/01/15 1207 12/04/15  WBC 9.2  < > 13.3*  < > 11.8*  < > 10.3  < > 10.7*  < > 7.3 7.7 8.7  NEUTROABS 5,612  --  10.0*  --  8.5*  --   --   --   --   --   --   --   --   HGB 11.6*  < > 10.5*  < > 8.3*  < > 7.1*  --  9.1*  < >  10.0* 13.5 10.8*  HCT 33.7*  < > 31.2*  < > 25.0*  < > 21.8*  --  27.6*  < > 30* 40.5 32*  MCV 85.3  < > 85.0  < > 85.9  < > 88.6  --  86.5  --   --  87.5  --   PLT 417*  < > 445*  < > 448*  < > 395  --  422*  < > 297 225 257  < > = values in this interval not displayed. Lipid  Recent Labs  11/05/15 0458  CHOL 91  HDL 25*  LDLCALC 44  TRIG 161   Cardiac Enzymes:  Recent Labs  11/03/15 1612  11/12/15 1845 11/13/15 0533 11/13/15 1516  CKTOTAL 604*  --   --   --   --   TROPONINI 0.14*  < > 0.05* 0.05* 0.05*  < > = values in this interval not displayed. BNP: No results for input(s): BNP in the last 8760 hours. CBG:  Recent Labs  12/01/15 1143 12/01/15 1452 12/01/15 1644  GLUCAP 92 70 71    Procedures and Imaging Studies During Stay: Dg Chest 2 View  Result Date: 12/01/2015 CLINICAL DATA:  Preop for left foot skin graft procedure. EXAM: CHEST  2 VIEW COMPARISON:  11/12/2015 FINDINGS: Right PICC line tip is in the region of the cavoatrial junction. The heart is enlarged but stable. Stable tortuosity and calcification of the thoracic aorta. The lungs are clear. No pleural effusion. The bony thorax is intact. IMPRESSION: No acute cardiopulmonary findings. Stable cardiac enlargement and aortic atherosclerosis. Right PICC line tip in good position at the cavoatrial junction. Electronically Signed   By: Rudie Meyer M.D.   On: 12/01/2015 11:58    Assessment/Plan:   Critical lower limb ischemia  PVD (peripheral vascular disease) with claudication (HCC)  S/P femoral-popliteal bypass surgery  Acute blood loss anemia  Cerebrovascular accident (CVA), unspecified mechanism (HCC)  NSTEMI (non-ST elevated myocardial infarction) (HCC)  Traumatic hemorrhagic shock, subsequent encounter  Hematoma of left thigh, subsequent encounter  Other acute osteomyelitis of left foot (HCC)  Diabetic ulcer of left midfoot associated with diabetes mellitus due to underlying condition, with  muscle involvement without evidence of necrosis (HCC)  H/O skin graft  History of blood transfusion  Uncontrolled type 2 diabetes mellitus with complication, with long-term current use of insulin (HCC)  DM type 2 with diabetic peripheral neuropathy (HCC)  Acute on chronic systolic  CHF (congestive heart failure) (HCC)  Hypertensive heart disease with chronic diastolic congestive heart failure (HCC)  Coronary artery disease involving native coronary artery of native heart without angina pectoris  Hyperlipidemia with target LDL less than 70   Patient is being discharged with the following home health services: HH/OT/PT/Nursing   Patient is being discharged with the following durable medical equipment:  16 in wheel chair  Patient has been advised to f/u with their PCP in 1-2 weeks to bring them up to date on their rehab stay.  Social services at facility was responsible for arranging this appointment.  Pt was provided with a 30 day supply of prescriptions for medications and refills must be obtained from their PCP.  For controlled substances, a more limited supply may be provided adequate until PCP appointment only.    Time spent > 30 min;> 50% of time with patient was spent reviewing records, labs, tests and studies, counseling and developing plan of care  Merrilee Seashore, MD

## 2015-12-22 ENCOUNTER — Encounter: Payer: Self-pay | Admitting: Internal Medicine

## 2015-12-22 DIAGNOSIS — S7010XA Contusion of unspecified thigh, initial encounter: Secondary | ICD-10-CM | POA: Insufficient documentation

## 2015-12-22 DIAGNOSIS — Z9289 Personal history of other medical treatment: Secondary | ICD-10-CM | POA: Insufficient documentation

## 2015-12-22 DIAGNOSIS — I5023 Acute on chronic systolic (congestive) heart failure: Secondary | ICD-10-CM | POA: Insufficient documentation

## 2015-12-22 LAB — CBC AND DIFFERENTIAL
HEMATOCRIT: 36 % (ref 36–46)
Hemoglobin: 12 g/dL (ref 12.0–16.0)
PLATELETS: 200 10*3/uL (ref 150–399)
WBC: 7.3 10^3/mL

## 2015-12-22 LAB — BASIC METABOLIC PANEL
BUN: 9 mg/dL (ref 4–21)
CREATININE: 0.6 mg/dL (ref 0.5–1.1)
GLUCOSE: 112 mg/dL
Potassium: 4.2 mmol/L (ref 3.4–5.3)
SODIUM: 142 mmol/L (ref 137–147)

## 2015-12-27 DIAGNOSIS — I10 Essential (primary) hypertension: Secondary | ICD-10-CM | POA: Diagnosis not present

## 2015-12-27 DIAGNOSIS — E1151 Type 2 diabetes mellitus with diabetic peripheral angiopathy without gangrene: Secondary | ICD-10-CM | POA: Diagnosis not present

## 2015-12-27 DIAGNOSIS — E1140 Type 2 diabetes mellitus with diabetic neuropathy, unspecified: Secondary | ICD-10-CM | POA: Diagnosis not present

## 2015-12-27 DIAGNOSIS — E11621 Type 2 diabetes mellitus with foot ulcer: Secondary | ICD-10-CM | POA: Diagnosis not present

## 2015-12-27 DIAGNOSIS — L97423 Non-pressure chronic ulcer of left heel and midfoot with necrosis of muscle: Secondary | ICD-10-CM | POA: Diagnosis not present

## 2015-12-27 DIAGNOSIS — I5022 Chronic systolic (congestive) heart failure: Secondary | ICD-10-CM | POA: Diagnosis not present

## 2015-12-28 ENCOUNTER — Telehealth (INDEPENDENT_AMBULATORY_CARE_PROVIDER_SITE_OTHER): Payer: Self-pay | Admitting: Orthopedic Surgery

## 2015-12-28 NOTE — Telephone Encounter (Signed)
ERROR

## 2015-12-29 DIAGNOSIS — E1151 Type 2 diabetes mellitus with diabetic peripheral angiopathy without gangrene: Secondary | ICD-10-CM | POA: Diagnosis not present

## 2015-12-29 DIAGNOSIS — I5022 Chronic systolic (congestive) heart failure: Secondary | ICD-10-CM | POA: Diagnosis not present

## 2015-12-29 DIAGNOSIS — I10 Essential (primary) hypertension: Secondary | ICD-10-CM | POA: Diagnosis not present

## 2015-12-29 DIAGNOSIS — E11621 Type 2 diabetes mellitus with foot ulcer: Secondary | ICD-10-CM | POA: Diagnosis not present

## 2015-12-29 DIAGNOSIS — E1140 Type 2 diabetes mellitus with diabetic neuropathy, unspecified: Secondary | ICD-10-CM | POA: Diagnosis not present

## 2015-12-29 DIAGNOSIS — L97423 Non-pressure chronic ulcer of left heel and midfoot with necrosis of muscle: Secondary | ICD-10-CM | POA: Diagnosis not present

## 2016-01-02 ENCOUNTER — Ambulatory Visit (INDEPENDENT_AMBULATORY_CARE_PROVIDER_SITE_OTHER): Payer: Medicare Other | Admitting: Internal Medicine

## 2016-01-02 ENCOUNTER — Encounter: Payer: Self-pay | Admitting: Internal Medicine

## 2016-01-02 VITALS — BP 116/70 | HR 93 | Temp 98.0°F

## 2016-01-02 DIAGNOSIS — L97509 Non-pressure chronic ulcer of other part of unspecified foot with unspecified severity: Secondary | ICD-10-CM | POA: Diagnosis not present

## 2016-01-02 DIAGNOSIS — M869 Osteomyelitis, unspecified: Secondary | ICD-10-CM

## 2016-01-02 DIAGNOSIS — I5022 Chronic systolic (congestive) heart failure: Secondary | ICD-10-CM | POA: Diagnosis not present

## 2016-01-02 DIAGNOSIS — I639 Cerebral infarction, unspecified: Secondary | ICD-10-CM

## 2016-01-02 DIAGNOSIS — E1151 Type 2 diabetes mellitus with diabetic peripheral angiopathy without gangrene: Secondary | ICD-10-CM | POA: Diagnosis not present

## 2016-01-02 DIAGNOSIS — E11621 Type 2 diabetes mellitus with foot ulcer: Secondary | ICD-10-CM | POA: Diagnosis not present

## 2016-01-02 DIAGNOSIS — I10 Essential (primary) hypertension: Secondary | ICD-10-CM | POA: Diagnosis not present

## 2016-01-02 DIAGNOSIS — L97423 Non-pressure chronic ulcer of left heel and midfoot with necrosis of muscle: Secondary | ICD-10-CM | POA: Diagnosis not present

## 2016-01-02 DIAGNOSIS — E1140 Type 2 diabetes mellitus with diabetic neuropathy, unspecified: Secondary | ICD-10-CM | POA: Diagnosis not present

## 2016-01-02 DIAGNOSIS — E1169 Type 2 diabetes mellitus with other specified complication: Secondary | ICD-10-CM | POA: Diagnosis not present

## 2016-01-02 LAB — CBC WITH DIFFERENTIAL/PLATELET
BASOS PCT: 0 %
Basophils Absolute: 0 cells/uL (ref 0–200)
EOS ABS: 282 {cells}/uL (ref 15–500)
Eosinophils Relative: 3 %
HEMATOCRIT: 34 % — AB (ref 35.0–45.0)
HEMOGLOBIN: 11.2 g/dL — AB (ref 11.7–15.5)
LYMPHS PCT: 27 %
Lymphs Abs: 2538 cells/uL (ref 850–3900)
MCH: 28.2 pg (ref 27.0–33.0)
MCHC: 32.9 g/dL (ref 32.0–36.0)
MCV: 85.6 fL (ref 80.0–100.0)
MONO ABS: 846 {cells}/uL (ref 200–950)
MPV: 8.7 fL (ref 7.5–12.5)
Monocytes Relative: 9 %
NEUTROS PCT: 61 %
Neutro Abs: 5734 cells/uL (ref 1500–7800)
Platelets: 293 10*3/uL (ref 140–400)
RBC: 3.97 MIL/uL (ref 3.80–5.10)
RDW: 15.1 % — AB (ref 11.0–15.0)
WBC: 9.4 10*3/uL (ref 3.8–10.8)

## 2016-01-02 LAB — BASIC METABOLIC PANEL
BUN: 15 mg/dL (ref 7–25)
CHLORIDE: 104 mmol/L (ref 98–110)
CO2: 22 mmol/L (ref 20–31)
Calcium: 8.9 mg/dL (ref 8.6–10.4)
Creat: 0.73 mg/dL (ref 0.50–0.99)
GLUCOSE: 114 mg/dL — AB (ref 65–99)
POTASSIUM: 5.2 mmol/L (ref 3.5–5.3)
Sodium: 140 mmol/L (ref 135–146)

## 2016-01-02 MED ORDER — CEPHALEXIN 500 MG PO CAPS: 500.0000 mg | ORAL_CAPSULE | Freq: Four times a day (QID) | ORAL | 1 refills | Status: DC

## 2016-01-02 NOTE — Progress Notes (Signed)
Rfv: hospital follow up for group b strep osteo of left foot Patient ID: [name] [name] Age, female   DOB: January 02, 1951, 65 y.o.   MRN: [id number]  HPI 65yo F with diabetes, vascular disease, treated for ceftriaxone 2gm IV daily x 6 wk plus doxycycline which she did not tolerate. She reports 24lb weight loss during her hosp and snf. She is feeling much better. She had skin graft for ischemic ulcer and plantar aspect of foot. Measuring 2 x 3 x 2cm on 11/3. She now gets daily silvadene application.   Her wound on her plantar aspect of foot is 8 cm long x nearly 4 cm wide  Outpatient Encounter Prescriptions as of 01/02/2016  Medication Sig  . amLODipine (NORVASC) 5 MG tablet TAKE 1 TABLET(5 MG) BY MOUTH DAILY  . aspirin EC 81 MG tablet Take 81 mg by mouth daily.  Marland Kitchen atorvastatin (LIPITOR) 80 MG tablet Take 1 tablet (80 mg total) by mouth daily.  . clopidogrel (PLAVIX) 75 MG tablet Take 1 tablet (75 mg total) by mouth daily. Holding for vascular surgery, restart as soon as possible after leg surgery (after 8/8)  . gabapentin (NEURONTIN) 300 MG capsule Take 300 mg by mouth 3 (three) times daily.   . Insulin Glargine (LANTUS) 100 UNIT/ML Solostar Pen Inject 25 Units into the skin daily at 10 pm.   . isosorbide mononitrate (IMDUR) 30 MG 24 hr tablet 30 mg. Take 1 tablet along by mouth with 60 mg to equal 90 mg daily.  . isosorbide mononitrate (IMDUR) 60 MG 24 hr tablet 60 mg. Take 1 tablet by mouth along with 30 mg to equal 90 mg daily  . ketoconazole (NIZORAL) 2 % cream Apply 1 application topically daily. Apply daily to lower extremities  . losartan (COZAAR) 100 MG tablet TAKE 1 TABLET BY MOUTH EVERY DAY  . metFORMIN (GLUCOPHAGE) 1000 MG tablet Take 1,000 mg by mouth 2 (two) times daily with a meal.  . metoprolol succinate (TOPROL-XL) 50 MG 24 hr tablet Take 1 tablet (50 mg total) by mouth daily.  . pantoprazole (PROTONIX) 40 MG tablet Take 1 tablet (40 mg total) by mouth daily.  . Amino Acids-Protein  Hydrolys (FEEDING SUPPLEMENT, PRO-STAT SUGAR FREE 64,) LIQD Take 30 mLs by mouth 2 (two) times daily. (Patient not taking: Reported on 01/02/2016)  . [EXPIRED] cefTRIAXone 2 g in dextrose 5 % 50 mL Inject 2 g into the vein daily.  . [EXPIRED] doxycycline (VIBRA-TABS) 100 MG tablet Take 100 mg by mouth 2 (two) times daily. Due to positive culture of strep B left foot wound  . furosemide (LASIX) 20 MG tablet Take 1 tablet (20 mg total) by mouth daily as needed for fluid or edema (shortness of breath). (Patient not taking: Reported on 01/02/2016)  . oxyCODONE-acetaminophen (PERCOCET/ROXICET) 5-325 MG tablet Take 2 tablets by mouth every 6 (six) hours as needed for moderate pain. (Patient not taking: Reported on 01/02/2016)  . polyethylene glycol (MIRALAX / GLYCOLAX) packet Take 17 g by mouth daily as needed. (Patient not taking: Reported on 01/02/2016)   No facility-administered encounter medications on file as of 01/02/2016.      Patient Active Problem List   Diagnosis Date Noted  . Thigh hematoma 12/22/2015  . History of blood transfusion 12/22/2015  . Acute on chronic systolic CHF (congestive heart failure) (HCC) 12/22/2015  . H/O skin graft 12/11/2015  . NSTEMI (non-ST elevated myocardial infarction) (HCC) 11/19/2015  . DM type 2 with diabetic peripheral neuropathy (HCC) 11/19/2015  .  Traumatic hemorrhagic shock (HCC)   . Chronic systolic CHF (congestive heart failure) (HCC)   . Uncontrolled type 2 diabetes mellitus with complication (HCC)   . Demand myocardial infarction 11/08/2015  . Surgery, elective   . Central line infection, initial encounter   . Septic shock (HCC) 11/03/2015  . Encephalopathy acute 11/03/2015  . Acute blood loss anemia 11/03/2015  . Acute respiratory failure with hypoxia (HCC)   . CVA (cerebral vascular accident) (HCC)   . S/P femoral-popliteal bypass surgery   . Malnutrition of moderate degree 11/01/2015  . Diabetic ulcer of left midfoot associated with diabetes  mellitus due to underlying condition, with muscle involvement without evidence of necrosis (HCC)   . Hypertensive heart disease with chronic diastolic congestive heart failure (HCC) 10/31/2015  . Osteomyelitis of left foot (HCC) 10/30/2015  . PVD (peripheral vascular disease) with claudication (HCC) 09/28/2015  . Critical lower limb ischemia 08/28/2015  . Hyperlipidemia with target LDL less than 70 06/28/2013  . Diabetes mellitus type II, uncontrolled (HCC) 03/03/2013  . Complicated migraine 03/03/2013  . Stroke-like symptom 03/02/2013  . Diabetes mellitus (HCC) 03/02/2013  . Aphasia 03/02/2013  . Headache(784.0) 03/02/2013  . PAD (peripheral artery disease) (HCC) 06/05/2011  . Coronary artery disease involving native coronary artery of native heart without angina pectoris 06/05/2011  . Hypotension 06/05/2011  . Claudication in peripheral vascular disease:  Lifestyle limiting. 06/05/2011  . Hx of tobacco use, presenting hazards to health 06/05/2011     Health Maintenance Due  Topic Date Due  . FOOT EXAM  03/27/1960  . OPHTHALMOLOGY EXAM  03/27/1960  . TETANUS/TDAP  03/27/1969  . PAP SMEAR  03/28/1971  . COLONOSCOPY  03/27/2000  . ZOSTAVAX  03/28/2010  . DEXA SCAN  03/28/2015     Review of Systems  Physical Exam   BP 116/70 (BP Location: Right Arm, Patient Position: Sitting, Cuff Size: Normal)   Pulse 93   Temp 98 F (36.7 C) (Oral)   SpO2 99%  Physical Exam  Constitutional:  oriented to person, place, and time. appears well-developed and well-nourished. No distress.  HENT: [place]/AT, PERRLA, no scleral icterus Mouth/Throat: Oropharynx is clear and moist. No oropharyngeal exudate.  Cardiovascular: Normal rate, regular rhythm and normal heart sounds. Exam reveals no gallop and no friction rub.  No murmur heard.  Pulmonary/Chest: Effort normal and breath sounds normal. No respiratory distress.  has no wheezes.  Neck = supple, no nuchal rigidity Abdominal: Soft. Bowel sounds  are normal.  exhibits no distension. There is no tenderness.  Lymphadenopathy: no cervical adenopathy. No axillary adenopathy Neurological: alert and oriented to person, place, and time.  Skin: foot lesions as measured above in hpi.    CBC Lab Results  Component Value Date   WBC 7.3 12/22/2015   RBC 4.63 12/01/2015   HGB 12.0 12/22/2015   HCT 36 12/22/2015   PLT 200 12/22/2015   MCV 87.5 12/01/2015   MCH 29.2 12/01/2015   MCHC 33.3 12/01/2015   RDW 14.9 12/01/2015   LYMPHSABS 1.8 11/08/2015   MONOABS 1.2 (H) 11/08/2015   EOSABS 0.3 11/08/2015   BASOSABS 0.0 11/08/2015   BMET Lab Results  Component Value Date   NA 142 12/22/2015   K 4.2 12/22/2015   CL 107 12/01/2015   CO2 22 12/01/2015   GLUCOSE 84 12/01/2015   BUN 9 12/22/2015   CREATININE 0.6 12/22/2015   CALCIUM 8.8 (L) 12/01/2015   GFRNONAA >60 12/01/2015   GFRAA >60 12/01/2015   Lab Results  Component Value Date   ESRSEDRATE 44 (H) 01/02/2016   Lab Results  Component Value Date   CRP 5.1 01/02/2016     Assessment and Plan  Diabetic foot osteo, left foot = Will check labs, likely need minimum of 2 wk of cephalexin but may need more.  Continue with wound care. Sees duda on 12/11  Addendum : plan to extend for addn 2 more weeks, thus a total of 4 wk of cephalexin

## 2016-01-03 LAB — C-REACTIVE PROTEIN: CRP: 5.1 mg/L (ref <8.0)

## 2016-01-03 LAB — SEDIMENTATION RATE: Sed Rate: 44 mm/hr — ABNORMAL HIGH (ref 0–30)

## 2016-01-04 ENCOUNTER — Telehealth (INDEPENDENT_AMBULATORY_CARE_PROVIDER_SITE_OTHER): Payer: Self-pay | Admitting: Orthopedic Surgery

## 2016-01-05 DIAGNOSIS — E1140 Type 2 diabetes mellitus with diabetic neuropathy, unspecified: Secondary | ICD-10-CM | POA: Diagnosis not present

## 2016-01-05 DIAGNOSIS — I10 Essential (primary) hypertension: Secondary | ICD-10-CM | POA: Diagnosis not present

## 2016-01-05 DIAGNOSIS — I5022 Chronic systolic (congestive) heart failure: Secondary | ICD-10-CM | POA: Diagnosis not present

## 2016-01-05 DIAGNOSIS — E1151 Type 2 diabetes mellitus with diabetic peripheral angiopathy without gangrene: Secondary | ICD-10-CM | POA: Diagnosis not present

## 2016-01-05 DIAGNOSIS — E11621 Type 2 diabetes mellitus with foot ulcer: Secondary | ICD-10-CM | POA: Diagnosis not present

## 2016-01-05 DIAGNOSIS — L97423 Non-pressure chronic ulcer of left heel and midfoot with necrosis of muscle: Secondary | ICD-10-CM | POA: Diagnosis not present

## 2016-01-08 ENCOUNTER — Encounter (INDEPENDENT_AMBULATORY_CARE_PROVIDER_SITE_OTHER): Payer: Self-pay | Admitting: Orthopedic Surgery

## 2016-01-08 ENCOUNTER — Ambulatory Visit (INDEPENDENT_AMBULATORY_CARE_PROVIDER_SITE_OTHER): Payer: Medicare Other | Admitting: Orthopedic Surgery

## 2016-01-08 DIAGNOSIS — L97423 Non-pressure chronic ulcer of left heel and midfoot with necrosis of muscle: Secondary | ICD-10-CM | POA: Diagnosis not present

## 2016-01-08 DIAGNOSIS — I5022 Chronic systolic (congestive) heart failure: Secondary | ICD-10-CM | POA: Diagnosis not present

## 2016-01-08 DIAGNOSIS — E1140 Type 2 diabetes mellitus with diabetic neuropathy, unspecified: Secondary | ICD-10-CM | POA: Diagnosis not present

## 2016-01-08 DIAGNOSIS — E11621 Type 2 diabetes mellitus with foot ulcer: Secondary | ICD-10-CM | POA: Diagnosis not present

## 2016-01-08 DIAGNOSIS — L97421 Non-pressure chronic ulcer of left heel and midfoot limited to breakdown of skin: Secondary | ICD-10-CM

## 2016-01-08 DIAGNOSIS — I10 Essential (primary) hypertension: Secondary | ICD-10-CM | POA: Diagnosis not present

## 2016-01-08 DIAGNOSIS — E1151 Type 2 diabetes mellitus with diabetic peripheral angiopathy without gangrene: Secondary | ICD-10-CM | POA: Diagnosis not present

## 2016-01-08 NOTE — Progress Notes (Signed)
Wound Care Note   Patient: [name] [name]           Date of Birth: 07/06/1950           MRN: [id number]             PCP: Georgann Housekeeper, MD Visit Date: 01/08/2016   Assessment & Plan: Visit Diagnoses:  1. Midfoot ulcer, left, limited to breakdown of skin (HCC)     Plan: The nonviable skin graft was debrided with a 10 blade knife the wound bed is healthy beefy viable there is no cellulitis no odor no drainage no signs of infection. We'll continue the Silvadene dressing changes continue nonweightbearing continue dialysis of cleansing call the office in 4 weeks   Follow-Up Instructions: Return in about 4 weeks (around 02/05/2016).  Orders:  No orders of the defined types were placed in this encounter.  No orders of the defined types were placed in this encounter.     Procedures: No notes on file   Clinical Data: No additional findings.   No images are attached to the encounter.   Subjective: Chief Complaint  Patient presents with  . Left Foot - Wound Check    12/01/15 left foot skin graft and vac application    Patient is here for follow up left foot skin graft and vac application 12/01/15. Patient is 5 weeks 3 days post op. She is having home health coming out twice a week. They are doing silvadene dressing changes. There is redness around wound bed. There is granulation tissue. There is one remaining staple removed at appointment today.    Wound Check     Review of Systems  Miscellaneous:     Objective: Vital Signs: There were no vitals taken for this visit.  Physical Exam: On examination patient is alert oriented no adenopathy well-dressed normal affect normal respiratory effort she am going to a wheelchair. Examination there is Viable skin graft tissue around the wound edges this was debrided there is petechial bleeding around the wound edges there is good granulation tissue incorporating into the skin graft. Patient has some venous stasis swelling in the  importance of elevation was discussed there is no cellulitis no signs of infection.  Specialty Comments: No specialty comments available.   PMFS History: Patient Active Problem List   Diagnosis Date Noted  . Midfoot ulcer, left, limited to breakdown of skin (HCC) 01/08/2016  . Thigh hematoma 12/22/2015  . History of blood transfusion 12/22/2015  . Acute on chronic systolic CHF (congestive heart failure) (HCC) 12/22/2015  . H/O skin graft 12/11/2015  . NSTEMI (non-ST elevated myocardial infarction) (HCC) 11/19/2015  . DM type 2 with diabetic peripheral neuropathy (HCC) 11/19/2015  . Traumatic hemorrhagic shock (HCC)   . Chronic systolic CHF (congestive heart failure) (HCC)   . Uncontrolled type 2 diabetes mellitus with complication (HCC)   . Demand myocardial infarction 11/08/2015  . Surgery, elective   . Central line infection, initial encounter   . Septic shock (HCC) 11/03/2015  . Encephalopathy acute 11/03/2015  . Acute blood loss anemia 11/03/2015  . Acute respiratory failure with hypoxia (HCC)   . CVA (cerebral vascular accident) (HCC)   . S/P femoral-popliteal bypass surgery   . Malnutrition of moderate degree 11/01/2015  . Diabetic ulcer of left midfoot associated with diabetes mellitus due to underlying condition, with muscle involvement without evidence of necrosis (HCC)   . Hypertensive heart disease with chronic diastolic congestive heart failure (HCC) 10/31/2015  . Osteomyelitis of left  foot (HCC) 10/30/2015  . PVD (peripheral vascular disease) with claudication (HCC) 09/28/2015  . Critical lower limb ischemia 08/28/2015  . Hyperlipidemia with target LDL less than 70 06/28/2013  . Diabetes mellitus type II, uncontrolled (HCC) 03/03/2013  . Complicated migraine 03/03/2013  . Stroke-like symptom 03/02/2013  . Diabetes mellitus (HCC) 03/02/2013  . Aphasia 03/02/2013  . Headache(784.0) 03/02/2013  . PAD (peripheral artery disease) (HCC) 06/05/2011  . Coronary artery  disease involving native coronary artery of native heart without angina pectoris 06/05/2011  . Hypotension 06/05/2011  . Claudication in peripheral vascular disease:  Lifestyle limiting. 06/05/2011  . Hx of tobacco use, presenting hazards to health 06/05/2011   Past Medical History:  Diagnosis Date  . Anemia 10/2015   Acute Blood Loss  . Arthritis    "feel like I have it all over" (08/28/2015)  . CHF (congestive heart failure) (HCC)   . Complication of anesthesia    DIFFICULT WAKING "only when I was smoking; no problems since I quit"  . Coronary artery disease   . Family history of adverse reaction to anesthesia    sister slow to wake up  . GERD (gastroesophageal reflux disease)    takes Protonix daily   . Hip bursitis   . History of blood transfusion    10/2015  . Hyperlipidemia LDL goal < 70 06/28/2013   takes Atorvastatin daily  . Hypertension    takes Metoprolol and Imdur daily  . Malnutrition (HCC)   . Migraine    "none in a long time" (08/28/2015)  . Myocardial infarction 2011  . Neuromuscular disorder (HCC)    DIABETIC NEUROPATHY  . Osteomyelitis (HCC) 2017   Left foot  . PAD (peripheral artery disease) (HCC)   . Peripheral vascular disease (HCC)   . Respiratory failure (HCC) 10/2015   Acute Hypoxia- acute pulmonary edema 11/13/2015  . Septic shock (HCC) 10/2015  . Stroke (HCC)   . Type II diabetes mellitus (HCC)    takes Lantus nightly.Average fasting blood sugar runs 80-90  Type II    Family History  Problem Relation [name] of Onset  . Hypertension Mother   . Heart failure Mother   . Heart failure Father   . Stroke Father   . Diabetes Father    Past Surgical History:  Procedure Laterality Date  . ABDOMINAL HYSTERECTOMY    . APPENDECTOMY    . ATHERECTOMY N/A 06/04/2011   Procedure: ATHERECTOMY;  Surgeon: Runell GessJonathan J Berry, MD;  [facility];  Service: Cardiovascular;  Laterality: N/A;  . CARDIAC CATHETERIZATION  10/13/2009   95% stenosis in the AV  groove circumflex and 95% ostial stenosis in small OM3. A 3x428mm drug-eluting Promus stent inserted ito the circumflex. Dilatated with a 3.25x5712mm noncompliant Quantum balloon within entire segment. The entire region was reduced to 0% and brisk TIMI3 flow.  . CAROTID DUPLEX  03/19/2011   Right ICA-demonstrates complete occlusion. Left ICA-demonstrates a small amount of fibrous plaque.  Marland Kitchen. CATARACT EXTRACTION W/ INTRAOCULAR LENS IMPLANT Right   . CESAREAN SECTION  1990  . CORONARY ANGIOPLASTY    . ENDARTERECTOMY FEMORAL Left 09/05/2015   Procedure: ENDARTERECTOMY FEMORAL WITH PROFUNDOPLASTY;  Surgeon: Chuck Hinthristopher S Dickson, MD;  [facility];  Service: Vascular;  Laterality: Left;  Left common femoral artery vein patch using left saphenous vien  . FEMORAL-POPLITEAL BYPASS GRAFT Left 11/02/2015   Procedure: BYPASS GRAFT FEMORAL-POPLITEAL ARTERY VS FEMORAL-TIBIAL ARTERY BYPASS;  Surgeon: Chuck Hinthristopher S Dickson, MD;  [facility];  Service: Vascular;  Laterality: Left;  . I&D EXTREMITY Left 11/10/2015   Procedure: Debridement Left Foot Ulcer, Application  Wound VAC;  Surgeon: [name], MD;  Location: [facility] OR;  Service: Orthopedics;  Laterality: Left;  . ILIAC ARTERY STENT Left 08/28/2015   common  . INTRAOPERATIVE ARTERIOGRAM Left 09/05/2015   Procedure: INTRA OPERATIVE ARTERIOGRAM;  Surgeon: Chuck Hint, MD;  Location: [facility] OR;  Service: Vascular;  Laterality: Left;  . INTRAOPERATIVE ARTERIOGRAM Left 11/02/2015   Procedure: INTRA OPERATIVE ARTERIOGRAM;  Surgeon: Chuck Hint, MD;  Location: [facility] OR;  Service: Vascular;  Laterality: Left;  . LEXISCAN MYOVIEW  10/25/2010   Moderate perfusion defect due to infarct/scar with mild perinfarct ischemia seen in the Basal Inferolateral, Basal Anterolateral, Mid Inferolateral, and Mid Anterolateral regions. Post-stress EF is 50%.  . OVARY SURGERY  1983?   "ruptured"  . PERIPHERAL VASCULAR ANGIOGRAM  01/26/2010   High-grade SFA disease: left  greater than right. Left SFA would require fem-pop bypass grafting. Right SFA could be stented but might require Diamondback Orbital atherectomy.  Marland Kitchen PERIPHERAL VASCULAR ANGIOGRAM  02/23/2010   Stealth Predator orbital rotational atherectomy performed on SFA & Popliteal up to 90,000 RPM. Stenting using overlapping 5x160mm and 5x58mm Absolute Pro Nitinol self-expanding stents beginning just at the knee up to the mid SFA resulting in reduction of 90-95% calcified SFA & Popliteal stenosis to 0. Stenting performed on the distal common & proximal iliac artery with a 10x4 Absolute Pro- 70-0%.  Marland Kitchen PERIPHERAL VASCULAR ANGIOGRAM  06/17/2010   PTA performed to the right external iliac artery stent using a 5x100 balloon at 10 atmospheres. Stenting performed using a 6x18 Genesis on Opta balloon. Postdilatation with a 7x2 balloon resulting in a 95% "in-stent" stenosis to 0% residual.  . PERIPHERAL VASCULAR ANGIOGRAM  06/04/2011   Bilateral total SFAs not percutaneously addressable. Good canidate for femoropopliteal bypass grafting  . PERIPHERAL VASCULAR ANGIOGRAM  08/28/2015  . PERIPHERAL VASCULAR CATHETERIZATION N/A 08/28/2015   Procedure: Lower Extremity Angiography;  Surgeon: Runell Gess, MD;  Location: [facility] INVASIVE CV LAB;  Service: Cardiovascular;  Laterality: N/A;  . PERIPHERAL VASCULAR CATHETERIZATION N/A 08/28/2015   Procedure: Abdominal Aortogram;  Surgeon: Runell Gess, MD;  Location: [facility] INVASIVE CV LAB;  Service: Cardiovascular;  Laterality: N/A;  . PERIPHERAL VASCULAR CATHETERIZATION Left 08/28/2015   Procedure: Peripheral Vascular Intervention;  Surgeon: Runell Gess, MD;  Location: [facility] INVASIVE CV LAB;  Service: Cardiovascular;  Laterality: Left;  common iliac  . PERIPHERAL VASCULAR CATHETERIZATION Left 08/28/2015   Procedure: Peripheral Vascular Atherectomy;  Surgeon: Runell Gess, MD;  Location: [facility] INVASIVE CV LAB;  Service: Cardiovascular;  Laterality: Left;  common iliac  . PERIPHERAL  VASCULAR CATHETERIZATION N/A 09/28/2015   Procedure: Lower Extremity Angiography;  Surgeon: Runell Gess, MD;  Location: [facility] INVASIVE CV LAB;  Service: Cardiovascular;  Laterality: N/A;  . PERIPHERAL VASCULAR CATHETERIZATION Left 09/28/2015   Procedure: Peripheral Vascular Intervention;  Surgeon: Runell Gess, MD;  Location: [facility] INVASIVE CV LAB;  Service: Cardiovascular;  Laterality: Left CFA  PCI with 9 mm x 4 cm Abbott nitinol absolute Pro self-expanding stent     . SKIN SPLIT GRAFT Left 12/01/2015   Procedure: LEFT FOOT SKIN GRAFT AND VAC;  Surgeon: [name], MD;  Location: [facility] OR;  Service: Orthopedics;  Laterality: Left;  . TRANSTHORACIC ECHOCARDIOGRAM  10/17/2009   EF 45-50%, moderate hypokinesis of the entire inferolateral myocardium, mild concentric hypertrophy and mild regurg of the mitral valva.  Marland Kitchen VEIN  HARVEST Left 11/02/2015   Procedure: LEFT GREATER SAPHENOUS VEIN HARVEST;  Surgeon: Chuck Hint, MD;  Location: [facility] OR;  Service: Vascular;  Laterality: Left;   Social History   Occupational History  . retired    Social History Main Topics  . Smoking status: Former Smoker    Packs/day: 1.50    Years: 41.00    Types: Cigarettes    Quit date: 10/12/2009  . Smokeless tobacco: Never Used  . Alcohol use No  . Drug use: No  . Sexual activity: Not Currently    Birth control/ protection: Surgical

## 2016-01-11 MED ORDER — CEPHALEXIN 500 MG PO CAPS: 500.0000 mg | ORAL_CAPSULE | Freq: Four times a day (QID) | ORAL | 0 refills | Status: DC

## 2016-01-11 NOTE — Telephone Encounter (Signed)
-----   Message from Judyann Munson, MD sent at 01/10/2016  9:15 PM EST ----- Can you call in an addn 2 more weeks of cephalexin 500mg  QID x 14d for her nad I should see her back beginning of jan

## 2016-01-11 NOTE — Telephone Encounter (Signed)
Per Dr Drue Second called the patient and advised her of a follow up apt and that a refill for 2 additional weeks of antibiotics has been called in for her. Patient was ok with this plan.

## 2016-01-17 DIAGNOSIS — I5022 Chronic systolic (congestive) heart failure: Secondary | ICD-10-CM | POA: Diagnosis not present

## 2016-01-17 DIAGNOSIS — I10 Essential (primary) hypertension: Secondary | ICD-10-CM | POA: Diagnosis not present

## 2016-01-17 DIAGNOSIS — E11621 Type 2 diabetes mellitus with foot ulcer: Secondary | ICD-10-CM | POA: Diagnosis not present

## 2016-01-17 DIAGNOSIS — E1140 Type 2 diabetes mellitus with diabetic neuropathy, unspecified: Secondary | ICD-10-CM | POA: Diagnosis not present

## 2016-01-17 DIAGNOSIS — L97423 Non-pressure chronic ulcer of left heel and midfoot with necrosis of muscle: Secondary | ICD-10-CM | POA: Diagnosis not present

## 2016-01-17 DIAGNOSIS — E1151 Type 2 diabetes mellitus with diabetic peripheral angiopathy without gangrene: Secondary | ICD-10-CM | POA: Diagnosis not present

## 2016-01-30 ENCOUNTER — Ambulatory Visit: Payer: Medicare Other | Admitting: Internal Medicine

## 2016-02-05 ENCOUNTER — Encounter (INDEPENDENT_AMBULATORY_CARE_PROVIDER_SITE_OTHER): Payer: Self-pay | Admitting: Orthopedic Surgery

## 2016-02-05 ENCOUNTER — Ambulatory Visit (INDEPENDENT_AMBULATORY_CARE_PROVIDER_SITE_OTHER): Payer: Medicare Other | Admitting: Orthopedic Surgery

## 2016-02-05 VITALS — Ht 64.0 in | Wt 145.0 lb

## 2016-02-05 DIAGNOSIS — Z945 Skin transplant status: Secondary | ICD-10-CM

## 2016-02-05 DIAGNOSIS — Z9889 Other specified postprocedural states: Secondary | ICD-10-CM

## 2016-02-05 NOTE — Progress Notes (Signed)
Office Visit Note   Patient: [name] [name]           Date of Birth: Aug 15, 1950           MRN: [id number] Visit Date: 02/05/2016              Requested by: Georgann Housekeeper, MD [address] [place], [place] [address] PCP: Georgann Housekeeper, MD  Chief Complaint  Patient presents with  . Left Foot - Routine Post Op    Left foot skin graft and vac 12/01/15 2 months post op.     HPI: Patient is here for left foot follow up. She questions if she should still continue with oral antibiotics, she was unable to attend last appointment with infectious disease. She has been without them for the past week. There is minimal redness, there is fibrinous exudative tissue. There is no odor. The lateral wounds on left foot are healing well. [name], RT  The patient is a 66 year old woman who is seen today in follow-up for split-thickness skin grafting to the plantar aspect of her left foot. Has completed her oral antibiotics, Keflex. Did miss her last follow-up with infectious disease. Encouraged her to make another appointment.  Has been doing Silvadene dressing changes daily following wound cleansing with Dial soap. Has been nonweightbearing in a wheelchair, sometimes using a walker.     Assessment & Plan: Visit Diagnoses:  1. H/O skin graft     Plan: Very pleased with improvement in plantar ulcer. Continue dial soap cleansing daily. Silvadene dressings. Remain nonweightbearing. Follow-up in 2 weeks  Follow-Up Instructions: Return in about 2 weeks (around 02/19/2016).   Ortho Exam The left foot plantar ulcer is much improved. Granulation tissue in wound bed. There is epithelialization around the wound edges. Scant exudative tissue. This was debrided with gauze today. Eschar debrided from lateral foot ulcers these are nearly healed. There is no drainage no surrounding erythema or sign of infection  Imaging: No results found.  Orders:  No orders of the defined types were placed  in this encounter.  No orders of the defined types were placed in this encounter.    Procedures: No procedures performed  Clinical Data: No additional findings.  Subjective: Review of Systems  Constitutional: Negative for chills and fever.  Skin: Positive for wound.    Objective: Vital Signs: Ht 5\' 4"  (1.626 m)   Wt 145 lb (65.8 kg)   BMI 24.89 kg/m   Specialty Comments:  No specialty comments available.  PMFS History: Patient Active Problem List   Diagnosis Date Noted  . Midfoot ulcer, left, limited to breakdown of skin (HCC) 01/08/2016  . Thigh hematoma 12/22/2015  . History of blood transfusion 12/22/2015  . Acute on chronic systolic CHF (congestive heart failure) (HCC) 12/22/2015  . H/O skin graft 12/11/2015  . NSTEMI (non-ST elevated myocardial infarction) (HCC) 11/19/2015  . DM type 2 with diabetic peripheral neuropathy (HCC) 11/19/2015  . Traumatic hemorrhagic shock (HCC)   . Chronic systolic CHF (congestive heart failure) (HCC)   . Uncontrolled type 2 diabetes mellitus with complication (HCC)   . Demand myocardial infarction 11/08/2015  . Surgery, elective   . Central line infection, initial encounter   . Septic shock (HCC) 11/03/2015  . Encephalopathy acute 11/03/2015  . Acute blood loss anemia 11/03/2015  . Acute respiratory failure with hypoxia (HCC)   . CVA (cerebral vascular accident) (HCC)   . S/P femoral-popliteal bypass surgery   . Malnutrition of moderate  degree 11/01/2015  . Diabetic ulcer of left midfoot associated with diabetes mellitus due to underlying condition, with muscle involvement without evidence of necrosis (HCC)   . Hypertensive heart disease with chronic diastolic congestive heart failure (HCC) 10/31/2015  . Osteomyelitis of left foot (HCC) 10/30/2015  . PVD (peripheral vascular disease) with claudication (HCC) 09/28/2015  . Critical lower limb ischemia 08/28/2015  . Hyperlipidemia with target LDL less than 70 06/28/2013  .  Diabetes mellitus type II, uncontrolled (HCC) 03/03/2013  . Complicated migraine 03/03/2013  . Stroke-like symptom 03/02/2013  . Diabetes mellitus (HCC) 03/02/2013  . Aphasia 03/02/2013  . Headache(784.0) 03/02/2013  . PAD (peripheral artery disease) (HCC) 06/05/2011  . Coronary artery disease involving native coronary artery of native heart without angina pectoris 06/05/2011  . Hypotension 06/05/2011  . Claudication in peripheral vascular disease:  Lifestyle limiting. 06/05/2011  . Hx of tobacco use, presenting hazards to health 06/05/2011   Past Medical History:  Diagnosis Date  . Anemia 10/2015   Acute Blood Loss  . Arthritis    "feel like I have it all over" (08/28/2015)  . CHF (congestive heart failure) (HCC)   . Complication of anesthesia    DIFFICULT WAKING "only when I was smoking; no problems since I quit"  . Coronary artery disease   . Family history of adverse reaction to anesthesia    sister slow to wake up  . GERD (gastroesophageal reflux disease)    takes Protonix daily   . Hip bursitis   . History of blood transfusion    10/2015  . Hyperlipidemia LDL goal < 70 06/28/2013   takes Atorvastatin daily  . Hypertension    takes Metoprolol and Imdur daily  . Malnutrition (HCC)   . Migraine    "none in a long time" (08/28/2015)  . Myocardial infarction 2011  . Neuromuscular disorder (HCC)    DIABETIC NEUROPATHY  . Osteomyelitis (HCC) 2017   Left foot  . PAD (peripheral artery disease) (HCC)   . Peripheral vascular disease (HCC)   . Respiratory failure (HCC) 10/2015   Acute Hypoxia- acute pulmonary edema 11/13/2015  . Septic shock (HCC) 10/2015  . Stroke (HCC)   . Type II diabetes mellitus (HCC)    takes Lantus nightly.Average fasting blood sugar runs 80-90  Type II    Family History  Problem Relation [name] of Onset  . Hypertension Mother   . Heart failure Mother   . Heart failure Father   . Stroke Father   . Diabetes Father     Past Surgical History:    Procedure Laterality Date  . ABDOMINAL HYSTERECTOMY    . APPENDECTOMY    . ATHERECTOMY N/A 06/04/2011   Procedure: ATHERECTOMY;  Surgeon: Runell Gess, MD;  Location: [facility] C[facility] LAB;  Service: Cardiovascular;  Laterality: N/A;  . CARDIAC CATHETERIZATION  10/13/2009   95% stenosis in the AV groove circumflex and 95% ostial stenosis in small OM3. A 3x42mm drug-eluting Promus stent inserted ito the circumflex. Dilatated with a 3.25x40mm noncompliant Quantum balloon within entire segment. The entire region was reduced to 0% and brisk TIMI3 flow.  . CAROTID DUPLEX  03/19/2011   Right ICA-demonstrates complete occlusion. Left ICA-demonstrates a small amount of fibrous plaque.  Marland Kitchen CATARACT EXTRACTION W/ INTRAOCULAR LENS IMPLANT Right   . CESAREAN SECTION  1990  . CORONARY ANGIOPLASTY    . ENDARTERECTOMY FEMORAL Left 09/05/2015   Procedure: ENDARTERECTOMY FEMORAL WITH PROFUNDOPLASTY;  Surgeon: Chuck Hint, MD;  Location[facility] OR;  Service:  Vascular;  Laterality: Left;  Left common femoral artery vein patch using left saphenous vien  . FEMORAL-POPLITEAL BYPASS GRAFT Left 11/02/2015   Procedure: BYPASS GRAFT FEMORAL-POPLITEAL ARTERY VS FEMORAL-TIBIAL ARTERY BYPASS;  Surgeon: Chuck Hint, MD;  Location: [facility] OR;  Service: Vascular;  Laterality: Left;  . I&D EXTREMITY Left 11/10/2015   Procedure: Debridement Left Foot Ulcer, Application  Wound VAC;  Surgeon: [name], MD;  Location: [facility] OR;  Service: Orthopedics;  Laterality: Left;  . ILIAC ARTERY STENT Left 08/28/2015   common  . INTRAOPERATIVE ARTERIOGRAM Left 09/05/2015   Procedure: INTRA OPERATIVE ARTERIOGRAM;  Surgeon: Chuck Hint, MD;  Location: [facility] OR;  Service: Vascular;  Laterality: Left;  . INTRAOPERATIVE ARTERIOGRAM Left 11/02/2015   Procedure: INTRA OPERATIVE ARTERIOGRAM;  Surgeon: Chuck Hint, MD;  Location: [facility] OR;  Service: Vascular;  Laterality: Left;  . LEXISCAN MYOVIEW  10/25/2010   Moderate perfusion  defect due to infarct/scar with mild perinfarct ischemia seen in the Basal Inferolateral, Basal Anterolateral, Mid Inferolateral, and Mid Anterolateral regions. Post-stress EF is 50%.  . OVARY SURGERY  1983?   "ruptured"  . PERIPHERAL VASCULAR ANGIOGRAM  01/26/2010   High-grade SFA disease: left greater than right. Left SFA would require fem-pop bypass grafting. Right SFA could be stented but might require Diamondback Orbital atherectomy.  Marland Kitchen PERIPHERAL VASCULAR ANGIOGRAM  02/23/2010   Stealth Predator orbital rotational atherectomy performed on SFA & Popliteal up to 90,000 RPM. Stenting using overlapping 5x165mm and 5x70mm Absolute Pro Nitinol self-expanding stents beginning just at the knee up to the mid SFA resulting in reduction of 90-95% calcified SFA & Popliteal stenosis to 0. Stenting performed on the distal common & proximal iliac artery with a 10x4 Absolute Pro- 70-0%.  Marland Kitchen PERIPHERAL VASCULAR ANGIOGRAM  06/17/2010   PTA performed to the right external iliac artery stent using a 5x100 balloon at 10 atmospheres. Stenting performed using a 6x18 Genesis on Opta balloon. Postdilatation with a 7x2 balloon resulting in a 95% "in-stent" stenosis to 0% residual.  . PERIPHERAL VASCULAR ANGIOGRAM  06/04/2011   Bilateral total SFAs not percutaneously addressable. Good canidate for femoropopliteal bypass grafting  . PERIPHERAL VASCULAR ANGIOGRAM  08/28/2015  . PERIPHERAL VASCULAR CATHETERIZATION N/A 08/28/2015   Procedure: Lower Extremity Angiography;  Surgeon: Runell Gess, MD;  Location: [facility] INVASIVE CV LAB;  Service: Cardiovascular;  Laterality: N/A;  . PERIPHERAL VASCULAR CATHETERIZATION N/A 08/28/2015   Procedure: Abdominal Aortogram;  Surgeon: Runell Gess, MD;  Location: [facility] INVASIVE CV LAB;  Service: Cardiovascular;  Laterality: N/A;  . PERIPHERAL VASCULAR CATHETERIZATION Left 08/28/2015   Procedure: Peripheral Vascular Intervention;  Surgeon: Runell Gess, MD;  Location: [facility] INVASIVE CV LAB;   Service: Cardiovascular;  Laterality: Left;  common iliac  . PERIPHERAL VASCULAR CATHETERIZATION Left 08/28/2015   Procedure: Peripheral Vascular Atherectomy;  Surgeon: Runell Gess, MD;  Location: [facility] INVASIVE CV LAB;  Service: Cardiovascular;  Laterality: Left;  common iliac  . PERIPHERAL VASCULAR CATHETERIZATION N/A 09/28/2015   Procedure: Lower Extremity Angiography;  Surgeon: Runell Gess, MD;  Location: [facility] INVASIVE CV LAB;  Service: Cardiovascular;  Laterality: N/A;  . PERIPHERAL VASCULAR CATHETERIZATION Left 09/28/2015   Procedure: Peripheral Vascular Intervention;  Surgeon: Runell Gess, MD;  Location: [facility] INVASIVE CV LAB;  Service: Cardiovascular;  Laterality: Left CFA  PCI with 9 mm x 4 cm Abbott nitinol absolute Pro self-expanding stent     . SKIN SPLIT GRAFT Left 12/01/2015   Procedure: LEFT FOOT SKIN GRAFT AND  VAC;  Surgeon: [name], MD;  Location: [facility] OR;  Service: Orthopedics;  Laterality: Left;  . TRANSTHORACIC ECHOCARDIOGRAM  10/17/2009   EF 45-50%, moderate hypokinesis of the entire inferolateral myocardium, mild concentric hypertrophy and mild regurg of the mitral valva.  Marland Kitchen VEIN HARVEST Left 11/02/2015   Procedure: LEFT GREATER SAPHENOUS VEIN HARVEST;  Surgeon: Chuck Hint, MD;  Location: [facility] OR;  Service: Vascular;  Laterality: Left;   Social History   Occupational History  . retired    Social History Main Topics  . Smoking status: Former Smoker    Packs/day: 1.50    Years: 41.00    Types: Cigarettes    Quit date: 10/12/2009  . Smokeless tobacco: Never Used  . Alcohol use No  . Drug use: No  . Sexual activity: Not Currently    Birth control/ protection: Surgical

## 2016-02-19 ENCOUNTER — Ambulatory Visit (INDEPENDENT_AMBULATORY_CARE_PROVIDER_SITE_OTHER): Payer: Medicare Other | Admitting: Orthopedic Surgery

## 2016-02-24 ENCOUNTER — Other Ambulatory Visit: Payer: Self-pay | Admitting: Cardiovascular Disease

## 2016-02-29 ENCOUNTER — Ambulatory Visit (INDEPENDENT_AMBULATORY_CARE_PROVIDER_SITE_OTHER): Payer: Medicare Other | Admitting: Orthopedic Surgery

## 2016-03-01 ENCOUNTER — Encounter (INDEPENDENT_AMBULATORY_CARE_PROVIDER_SITE_OTHER): Payer: Self-pay | Admitting: Orthopedic Surgery

## 2016-03-01 ENCOUNTER — Ambulatory Visit (INDEPENDENT_AMBULATORY_CARE_PROVIDER_SITE_OTHER): Payer: Medicare Other | Admitting: Orthopedic Surgery

## 2016-03-01 VITALS — Ht 64.0 in | Wt 145.0 lb

## 2016-03-01 DIAGNOSIS — L97421 Non-pressure chronic ulcer of left heel and midfoot limited to breakdown of skin: Secondary | ICD-10-CM

## 2016-03-01 NOTE — Progress Notes (Signed)
Office Visit Note   Patient: [name] [name]           Date of Birth: 11-Mar-1950           MRN: [id number] Visit Date: 03/01/2016              Requested by: Georgann Housekeeper, MD [address] [place], [place] [address] PCP: Georgann Housekeeper, MD  Chief Complaint  Patient presents with  . Left Foot - Routine Post Op    Left foot skin graft and vac 12/01/15 ~3 months post op    HPI: Patient presents today for follow up left foot skin grafting and vac 12/01/15. She is approximately 3 months post op. She is doing silvadene dressing changes. She is non weightbearing on foot the majority of the time. All wounds show excellent improvement. Lateral wounds are almost healed, small amount of fibrinous exudative tissue. There is no odor, no redness. [name], RT    Assessment & Plan: Visit Diagnoses:  1. Midfoot ulcer, left, limited to breakdown of skin (HCC)     Plan: Continue Silvadene dressing changes continue nonweightbearing.  Follow-Up Instructions: Return in about 4 weeks (around 03/29/2016).   Ortho Exam Patient is showing excellent improvement of her plantar ulcer skin graft. The lateral ulcers have completely healed. Plantarly she has about 10 x 25 mm x 0.1 mm deep of residual wound with good granulation tissue no cellulitis no drainage no odor no signs of infection. Iodosorb plus 2 x 2 gauze plus Ace wrap was applied.  Imaging: No results found.  Orders:  No orders of the defined types were placed in this encounter.  No orders of the defined types were placed in this encounter.    Procedures: No procedures performed  Clinical Data: No additional findings.  Subjective: Review of Systems  Objective: Vital Signs: Ht 5\' 4"  (1.626 m)   Wt 145 lb (65.8 kg)   BMI 24.89 kg/m   Specialty Comments:  No specialty comments available.  PMFS History: Patient Active Problem List   Diagnosis Date Noted  . Midfoot ulcer, left, limited to breakdown of skin (HCC)  01/08/2016  . Thigh hematoma 12/22/2015  . History of blood transfusion 12/22/2015  . Acute on chronic systolic CHF (congestive heart failure) (HCC) 12/22/2015  . H/O skin graft 12/11/2015  . NSTEMI (non-ST elevated myocardial infarction) (HCC) 11/19/2015  . DM type 2 with diabetic peripheral neuropathy (HCC) 11/19/2015  . Traumatic hemorrhagic shock (HCC)   . Chronic systolic CHF (congestive heart failure) (HCC)   . Uncontrolled type 2 diabetes mellitus with complication (HCC)   . Demand myocardial infarction 11/08/2015  . Surgery, elective   . Central line infection, initial encounter   . Septic shock (HCC) 11/03/2015  . Encephalopathy acute 11/03/2015  . Acute blood loss anemia 11/03/2015  . Acute respiratory failure with hypoxia (HCC)   . CVA (cerebral vascular accident) (HCC)   . S/P femoral-popliteal bypass surgery   . Malnutrition of moderate degree 11/01/2015  . Diabetic ulcer of left midfoot associated with diabetes mellitus due to underlying condition, with muscle involvement without evidence of necrosis (HCC)   . Hypertensive heart disease with chronic diastolic congestive heart failure (HCC) 10/31/2015  . Osteomyelitis of left foot (HCC) 10/30/2015  . PVD (peripheral vascular disease) with claudication (HCC) 09/28/2015  . Critical lower limb ischemia 08/28/2015  . Hyperlipidemia with target LDL less than 70 06/28/2013  . Diabetes mellitus type II, uncontrolled (HCC) 03/03/2013  . Complicated  migraine 03/03/2013  . Stroke-like symptom 03/02/2013  . Diabetes mellitus (HCC) 03/02/2013  . Aphasia 03/02/2013  . Headache(784.0) 03/02/2013  . PAD (peripheral artery disease) (HCC) 06/05/2011  . Coronary artery disease involving native coronary artery of native heart without angina pectoris 06/05/2011  . Hypotension 06/05/2011  . Claudication in peripheral vascular disease:  Lifestyle limiting. 06/05/2011  . Hx of tobacco use, presenting hazards to health 06/05/2011   Past  Medical History:  Diagnosis Date  . Anemia 10/2015   Acute Blood Loss  . Arthritis    "feel like I have it all over" (08/28/2015)  . CHF (congestive heart failure) (HCC)   . Complication of anesthesia    DIFFICULT WAKING "only when I was smoking; no problems since I quit"  . Coronary artery disease   . Family history of adverse reaction to anesthesia    sister slow to wake up  . GERD (gastroesophageal reflux disease)    takes Protonix daily   . Hip bursitis   . History of blood transfusion    10/2015  . Hyperlipidemia LDL goal < 70 06/28/2013   takes Atorvastatin daily  . Hypertension    takes Metoprolol and Imdur daily  . Malnutrition (HCC)   . Migraine    "none in a long time" (08/28/2015)  . Myocardial infarction 2011  . Neuromuscular disorder (HCC)    DIABETIC NEUROPATHY  . Osteomyelitis (HCC) 2017   Left foot  . PAD (peripheral artery disease) (HCC)   . Peripheral vascular disease (HCC)   . Respiratory failure (HCC) 10/2015   Acute Hypoxia- acute pulmonary edema 11/13/2015  . Septic shock (HCC) 10/2015  . Stroke (HCC)   . Type II diabetes mellitus (HCC)    takes Lantus nightly.Average fasting blood sugar runs 80-90  Type II    Family History  Problem Relation [name] of Onset  . Hypertension Mother   . Heart failure Mother   . Heart failure Father   . Stroke Father   . Diabetes Father     Past Surgical History:  Procedure Laterality Date  . ABDOMINAL HYSTERECTOMY    . APPENDECTOMY    . ATHERECTOMY N/A 06/04/2011   Procedure: ATHERECTOMY;  Surgeon: Runell Gess, MD;  Location: [facility] CATH LAB;  Service: Cardiovascular;  Laterality: N/A;  . CARDIAC CATHETERIZATION  10/13/2009   95% stenosis in the AV groove circumflex and 95% ostial stenosis in small OM3. A 3x54mm drug-eluting Promus stent inserted ito the circumflex. Dilatated with a 3.25x26mm noncompliant Quantum balloon within entire segment. The entire region was reduced to 0% and brisk TIMI3 flow.  . CAROTID  DUPLEX  03/19/2011   Right ICA-demonstrates complete occlusion. Left ICA-demonstrates a small amount of fibrous plaque.  Marland Kitchen CATARACT EXTRACTION W/ INTRAOCULAR LENS IMPLANT Right   . CESAREAN SECTION  1990  . CORONARY ANGIOPLASTY    . ENDARTERECTOMY FEMORAL Left 09/05/2015   Procedure: ENDARTERECTOMY FEMORAL WITH PROFUNDOPLASTY;  Surgeon: Chuck Hint, MD;  Location: [facility] OR;  Service: Vascular;  Laterality: Left;  Left common femoral artery vein patch using left saphenous vien  . FEMORAL-POPLITEAL BYPASS GRAFT Left 11/02/2015   Procedure: BYPASS GRAFT FEMORAL-POPLITEAL ARTERY VS FEMORAL-TIBIAL ARTERY BYPASS;  Surgeon: Chuck Hint, MD;  Location: [facility] OR;  Service: Vascular;  Laterality: Left;  . I&D EXTREMITY Left 11/10/2015   Procedure: Debridement Left Foot Ulcer, Application  Wound VAC;  Surgeon: [name], MD;  Location: [facility] OR;  Service: Orthopedics;  Laterality: Left;  . ILIAC ARTERY STENT  Left 08/28/2015   common  . INTRAOPERATIVE ARTERIOGRAM Left 09/05/2015   Procedure: INTRA OPERATIVE ARTERIOGRAM;  Surgeon: Chuck Hint, MD;  Location: [facility] OR;  Service: Vascular;  Laterality: Left;  . INTRAOPERATIVE ARTERIOGRAM Left 11/02/2015   Procedure: INTRA OPERATIVE ARTERIOGRAM;  Surgeon: Chuck Hint, MD;  Location: [facility] OR;  Service: Vascular;  Laterality: Left;  . LEXISCAN MYOVIEW  10/25/2010   Moderate perfusion defect due to infarct/scar with mild perinfarct ischemia seen in the Basal Inferolateral, Basal Anterolateral, Mid Inferolateral, and Mid Anterolateral regions. Post-stress EF is 50%.  . OVARY SURGERY  1983?   "ruptured"  . PERIPHERAL VASCULAR ANGIOGRAM  01/26/2010   High-grade SFA disease: left greater than right. Left SFA would require fem-pop bypass grafting. Right SFA could be stented but might require Diamondback Orbital atherectomy.  Marland Kitchen PERIPHERAL VASCULAR ANGIOGRAM  02/23/2010   Stealth Predator orbital rotational atherectomy performed on SFA & Popliteal  up to 90,000 RPM. Stenting using overlapping 5x121mm and 5x81mm Absolute Pro Nitinol self-expanding stents beginning just at the knee up to the mid SFA resulting in reduction of 90-95% calcified SFA & Popliteal stenosis to 0. Stenting performed on the distal common & proximal iliac artery with a 10x4 Absolute Pro- 70-0%.  Marland Kitchen PERIPHERAL VASCULAR ANGIOGRAM  06/17/2010   PTA performed to the right external iliac artery stent using a 5x100 balloon at 10 atmospheres. Stenting performed using a 6x18 Genesis on Opta balloon. Postdilatation with a 7x2 balloon resulting in a 95% "in-stent" stenosis to 0% residual.  . PERIPHERAL VASCULAR ANGIOGRAM  06/04/2011   Bilateral total SFAs not percutaneously addressable. Good canidate for femoropopliteal bypass grafting  . PERIPHERAL VASCULAR ANGIOGRAM  08/28/2015  . PERIPHERAL VASCULAR CATHETERIZATION N/A 08/28/2015   Procedure: Lower Extremity Angiography;  Surgeon: Runell Gess, MD;  Location: [facility] INVASIVE CV LAB;  Service: Cardiovascular;  Laterality: N/A;  . PERIPHERAL VASCULAR CATHETERIZATION N/A 08/28/2015   Procedure: Abdominal Aortogram;  Surgeon: Runell Gess, MD;  Location: [facility] INVASIVE CV LAB;  Service: Cardiovascular;  Laterality: N/A;  . PERIPHERAL VASCULAR CATHETERIZATION Left 08/28/2015   Procedure: Peripheral Vascular Intervention;  Surgeon: Runell Gess, MD;  Location: [facility] INVASIVE CV LAB;  Service: Cardiovascular;  Laterality: Left;  common iliac  . PERIPHERAL VASCULAR CATHETERIZATION Left 08/28/2015   Procedure: Peripheral Vascular Atherectomy;  Surgeon: Runell Gess, MD;  Location: [facility] INVASIVE CV LAB;  Service: Cardiovascular;  Laterality: Left;  common iliac  . PERIPHERAL VASCULAR CATHETERIZATION N/A 09/28/2015   Procedure: Lower Extremity Angiography;  Surgeon: Runell Gess, MD;  Location: [facility] INVASIVE CV LAB;  Service: Cardiovascular;  Laterality: N/A;  . PERIPHERAL VASCULAR CATHETERIZATION Left 09/28/2015   Procedure: Peripheral  Vascular Intervention;  Surgeon: Runell Gess, MD;  Location: [facility] INVASIVE CV LAB;  Service: Cardiovascular;  Laterality: Left CFA  PCI with 9 mm x 4 cm Abbott nitinol absolute Pro self-expanding stent     . SKIN SPLIT GRAFT Left 12/01/2015   Procedure: LEFT FOOT SKIN GRAFT AND VAC;  Surgeon: [name], MD;  Location: [facility] OR;  Service: Orthopedics;  Laterality: Left;  . TRANSTHORACIC ECHOCARDIOGRAM  10/17/2009   EF 45-50%, moderate hypokinesis of the entire inferolateral myocardium, mild concentric hypertrophy and mild regurg of the mitral valva.  Marland Kitchen VEIN HARVEST Left 11/02/2015   Procedure: LEFT GREATER SAPHENOUS VEIN HARVEST;  Surgeon: Chuck Hint, MD;  Location: [facility] OR;  Service: Vascular;  Laterality: Left;   Social History   Occupational History  . retired  Social History Main Topics  . Smoking status: Former Smoker    Packs/day: 1.50    Years: 41.00    Types: Cigarettes    Quit date: 10/12/2009  . Smokeless tobacco: Never Used  . Alcohol use No  . Drug use: No  . Sexual activity: Not Currently    Birth control/ protection: Surgical

## 2016-03-07 ENCOUNTER — Encounter: Payer: Self-pay | Admitting: Vascular Surgery

## 2016-03-13 ENCOUNTER — Ambulatory Visit (HOSPITAL_COMMUNITY): Payer: Medicare Other

## 2016-03-13 ENCOUNTER — Ambulatory Visit: Payer: Medicare Other | Admitting: Vascular Surgery

## 2016-03-26 ENCOUNTER — Other Ambulatory Visit: Payer: Self-pay | Admitting: Cardiovascular Disease

## 2016-03-26 ENCOUNTER — Telehealth: Payer: Self-pay | Admitting: *Deleted

## 2016-03-26 NOTE — Telephone Encounter (Signed)
I called pharmacy while doing refills this afternoon. Saw request for patient refill requesting 90 day supply. I spoke with pharmacist to verify if 90 day supply was being requested pharmacist said yes. I declined 90 day supply based on patient needs to make appointment and keep appointment, patient keeps cancelling once she receives refills. Patient got 02/26/16 #30 x 2 refills.while on phone with pharmacist looks like Romie Levee, CMA authorized refill for #90 x 2. Patient is not currently scheduled for follow up, patient cancelled 03/13/16 appointment.

## 2016-03-28 ENCOUNTER — Ambulatory Visit (INDEPENDENT_AMBULATORY_CARE_PROVIDER_SITE_OTHER): Payer: Medicare Other | Admitting: Orthopedic Surgery

## 2016-03-29 ENCOUNTER — Ambulatory Visit (INDEPENDENT_AMBULATORY_CARE_PROVIDER_SITE_OTHER): Payer: Medicare Other | Admitting: Orthopedic Surgery

## 2016-03-29 ENCOUNTER — Encounter: Payer: Self-pay | Admitting: Vascular Surgery

## 2016-03-29 ENCOUNTER — Encounter (INDEPENDENT_AMBULATORY_CARE_PROVIDER_SITE_OTHER): Payer: Self-pay | Admitting: Orthopedic Surgery

## 2016-03-29 DIAGNOSIS — L97421 Non-pressure chronic ulcer of left heel and midfoot limited to breakdown of skin: Secondary | ICD-10-CM

## 2016-03-29 NOTE — Progress Notes (Signed)
Office Visit Note   Patient: [name] [name] Age           Date of Birth: 1950-06-20           MRN: [id number] Visit Date: 03/29/2016              Requested by: Georgann Housekeeper, MD [address] [place], [place] [address] PCP: Georgann Housekeeper, MD  Chief Complaint  Patient presents with  . Left Foot - Follow-up    Left foot skin graft and vac 12/01/15     HPI: Patient presents today for follow up left foot skin grafting and vac 12/01/15. She is approximately 4 months post op. She is doing silvadene dressing changes. She is non weightbearing on foot the majority of the time. Wound continues to show excellent improvement. There is small amount of fibrinous exudative tissue. There is no odor and minimal swelling [name], RT    Assessment & Plan: Visit Diagnoses:  1. Midfoot ulcer, left, limited to breakdown of skin (HCC)     Plan: Patient has venous stasis swelling in the left lower extremity we will have her go to [facility] discount medical to obtain apparent medical compression stockings she will wear this around the clock adjacent to the wound change the sock daily wash with soap and water daily wash the DAILY.  Follow-Up Instructions: Return in about 2 weeks (around 04/12/2016).   Ortho Exam On examination patient is alert oriented no adenopathy well-dressed normal affect normal respiratory effort she does have an antalgic gait. Examination patient has venous stasis changes with brawny skin color changes and pitting edema in the left lower extremity she is pitting edema in the foot and ankle up to the tibial tubercle. The plantar ulcer is healing quite nicely it's currently 10 x 20 mm 0.1 mm deep with no redness no cellulitis no odor no drainage no signs of infection. ROS: Complete review of systems negative except as mentioned in the H&P. Imaging: No results found.  Labs: Lab Results  Component Value Date   HGBA1C 7.6 (H) 11/02/2015   HGBA1C 8.3 (H) 09/01/2015   HGBA1C 13.2 (H) 03/03/2013   ESRSEDRATE 44 (H) 01/02/2016   ESRSEDRATE 135 (H) 11/02/2015   CRP 5.1 01/02/2016   CRP 3.7 (H) 11/02/2015   REPTSTATUS 11/15/2015 FINAL 11/10/2015   REPTSTATUS 11/15/2015 FINAL 11/10/2015   GRAMSTAIN  11/10/2015    ABUNDANT WBC PRESENT, PREDOMINANTLY PMN NO ORGANISMS SEEN    GRAMSTAIN  11/10/2015    ABUNDANT WBC PRESENT, PREDOMINANTLY PMN RARE GRAM POSITIVE COCCI IN PAIRS    CULT  11/10/2015    FEW GROUP B STREP(S.AGALACTIAE)ISOLATED TESTING AGAINST S. AGALACTIAE NOT ROUTINELY PERFORMED DUE TO PREDICTABILITY OF AMP/PEN/VAN SUSCEPTIBILITY. NO ANAEROBES ISOLATED    CULT  11/10/2015    RARE GROUP B STREP(S.AGALACTIAE)ISOLATED TESTING AGAINST S. AGALACTIAE NOT ROUTINELY PERFORMED DUE TO PREDICTABILITY OF AMP/PEN/VAN SUSCEPTIBILITY. NO ANAEROBES ISOLATED     Orders:  No orders of the defined types were placed in this encounter.  No orders of the defined types were placed in this encounter.    Procedures: No procedures performed  Clinical Data: No additional findings.  Subjective: Review of Systems  Objective: Vital Signs: There were no vitals taken for this visit.  Specialty Comments:  No specialty comments available.  PMFS History: Patient Active Problem List   Diagnosis Date Noted  . Midfoot ulcer, left, limited to breakdown of skin (HCC) 01/08/2016  . Thigh hematoma 12/22/2015  . History of blood  transfusion 12/22/2015  . Acute on chronic systolic CHF (congestive heart failure) (HCC) 12/22/2015  . H/O skin graft 12/11/2015  . NSTEMI (non-ST elevated myocardial infarction) (HCC) 11/19/2015  . DM type 2 with diabetic peripheral neuropathy (HCC) 11/19/2015  . Traumatic hemorrhagic shock (HCC)   . Chronic systolic CHF (congestive heart failure) (HCC)   . Uncontrolled type 2 diabetes mellitus with complication (HCC)   . Demand myocardial infarction 11/08/2015  . Surgery, elective   . Central line infection, initial encounter   .  Septic shock (HCC) 11/03/2015  . Encephalopathy acute 11/03/2015  . Acute blood loss anemia 11/03/2015  . Acute respiratory failure with hypoxia (HCC)   . CVA (cerebral vascular accident) (HCC)   . S/P femoral-popliteal bypass surgery   . Malnutrition of moderate degree 11/01/2015  . Diabetic ulcer of left midfoot associated with diabetes mellitus due to underlying condition, with muscle involvement without evidence of necrosis (HCC)   . Hypertensive heart disease with chronic diastolic congestive heart failure (HCC) 10/31/2015  . Osteomyelitis of left foot (HCC) 10/30/2015  . PVD (peripheral vascular disease) with claudication (HCC) 09/28/2015  . Critical lower limb ischemia 08/28/2015  . Hyperlipidemia with target LDL less than 70 06/28/2013  . Diabetes mellitus type II, uncontrolled (HCC) 03/03/2013  . Complicated migraine 03/03/2013  . Stroke-like symptom 03/02/2013  . Diabetes mellitus (HCC) 03/02/2013  . Aphasia 03/02/2013  . Headache(784.0) 03/02/2013  . PAD (peripheral artery disease) (HCC) 06/05/2011  . Coronary artery disease involving native coronary artery of native heart without angina pectoris 06/05/2011  . Hypotension 06/05/2011  . Claudication in peripheral vascular disease:  Lifestyle limiting. 06/05/2011  . Hx of tobacco use, presenting hazards to health 06/05/2011   Past Medical History:  Diagnosis Date  . Anemia 10/2015   Acute Blood Loss  . Arthritis    "feel like I have it all over" (08/28/2015)  . CHF (congestive heart failure) (HCC)   . Complication of anesthesia    DIFFICULT WAKING "only when I was smoking; no problems since I quit"  . Coronary artery disease   . Family history of adverse reaction to anesthesia    sister slow to wake up  . GERD (gastroesophageal reflux disease)    takes Protonix daily   . Hip bursitis   . History of blood transfusion    10/2015  . Hyperlipidemia LDL goal < 70 06/28/2013   takes Atorvastatin daily  . Hypertension     takes Metoprolol and Imdur daily  . Malnutrition (HCC)   . Migraine    "none in a long time" (08/28/2015)  . Myocardial infarction 2011  . Neuromuscular disorder (HCC)    DIABETIC NEUROPATHY  . Osteomyelitis (HCC) 2017   Left foot  . PAD (peripheral artery disease) (HCC)   . Peripheral vascular disease (HCC)   . Respiratory failure (HCC) 10/2015   Acute Hypoxia- acute pulmonary edema 11/13/2015  . Septic shock (HCC) 10/2015  . Stroke (HCC)   . Type II diabetes mellitus (HCC)    takes Lantus nightly.Average fasting blood sugar runs 80-90  Type II    Family History  Problem Relation Age of Onset  . Hypertension Mother   . Heart failure Mother   . Heart failure Father   . Stroke Father   . Diabetes Father     Past Surgical History:  Procedure Laterality Date  . ABDOMINAL HYSTERECTOMY    . APPENDECTOMY    . ATHERECTOMY N/A 06/04/2011   Procedure: ATHERECTOMY;  Surgeon: Christiane Ha  Erlene Quan, MD;  Location: [facility] CATH LAB;  Service: Cardiovascular;  Laterality: N/A;  . CARDIAC CATHETERIZATION  10/13/2009   95% stenosis in the AV groove circumflex and 95% ostial stenosis in small OM3. A 3x61mm drug-eluting Promus stent inserted ito the circumflex. Dilatated with a 3.25x62mm noncompliant Quantum balloon within entire segment. The entire region was reduced to 0% and brisk TIMI3 flow.  . CAROTID DUPLEX  03/19/2011   Right ICA-demonstrates complete occlusion. Left ICA-demonstrates a small amount of fibrous plaque.  Marland Kitchen CATARACT EXTRACTION W/ INTRAOCULAR LENS IMPLANT Right   . CESAREAN SECTION  1990  . CORONARY ANGIOPLASTY    . ENDARTERECTOMY FEMORAL Left 09/05/2015   Procedure: ENDARTERECTOMY FEMORAL WITH PROFUNDOPLASTY;  Surgeon: Chuck Hint, MD;  Location: [facility] OR;  Service: Vascular;  Laterality: Left;  Left common femoral artery vein patch using left saphenous vien  . FEMORAL-POPLITEAL BYPASS GRAFT Left 11/02/2015   Procedure: BYPASS GRAFT FEMORAL-POPLITEAL ARTERY VS FEMORAL-TIBIAL ARTERY  BYPASS;  Surgeon: Chuck Hint, MD;  Location: [facility] OR;  Service: Vascular;  Laterality: Left;  . I&D EXTREMITY Left 11/10/2015   Procedure: Debridement Left Foot Ulcer, Application  Wound VAC;  Surgeon: [name], MD;  Location: [facility] OR;  Service: Orthopedics;  Laterality: Left;  . ILIAC ARTERY STENT Left 08/28/2015   common  . INTRAOPERATIVE ARTERIOGRAM Left 09/05/2015   Procedure: INTRA OPERATIVE ARTERIOGRAM;  Surgeon: Chuck Hint, MD;  Location: [facility] OR;  Service: Vascular;  Laterality: Left;  . INTRAOPERATIVE ARTERIOGRAM Left 11/02/2015   Procedure: INTRA OPERATIVE ARTERIOGRAM;  Surgeon: Chuck Hint, MD;  Location: [facility] OR;  Service: Vascular;  Laterality: Left;  . LEXISCAN MYOVIEW  10/25/2010   Moderate perfusion defect due to infarct/scar with mild perinfarct ischemia seen in the Basal Inferolateral, Basal Anterolateral, Mid Inferolateral, and Mid Anterolateral regions. Post-stress EF is 50%.  . OVARY SURGERY  1983?   "ruptured"  . PERIPHERAL VASCULAR ANGIOGRAM  01/26/2010   High-grade SFA disease: left greater than right. Left SFA would require fem-pop bypass grafting. Right SFA could be stented but might require Diamondback Orbital atherectomy.  Marland Kitchen PERIPHERAL VASCULAR ANGIOGRAM  02/23/2010   Stealth Predator orbital rotational atherectomy performed on SFA & Popliteal up to 90,000 RPM. Stenting using overlapping 5x143mm and 5x2mm Absolute Pro Nitinol self-expanding stents beginning just at the knee up to the mid SFA resulting in reduction of 90-95% calcified SFA & Popliteal stenosis to 0. Stenting performed on the distal common & proximal iliac artery with a 10x4 Absolute Pro- 70-0%.  Marland Kitchen PERIPHERAL VASCULAR ANGIOGRAM  06/17/2010   PTA performed to the right external iliac artery stent using a 5x100 balloon at 10 atmospheres. Stenting performed using a 6x18 Genesis on Opta balloon. Postdilatation with a 7x2 balloon resulting in a 95% "in-stent" stenosis to 0% residual.  .  PERIPHERAL VASCULAR ANGIOGRAM  06/04/2011   Bilateral total SFAs not percutaneously addressable. Good canidate for femoropopliteal bypass grafting  . PERIPHERAL VASCULAR ANGIOGRAM  08/28/2015  . PERIPHERAL VASCULAR CATHETERIZATION N/A 08/28/2015   Procedure: Lower Extremity Angiography;  Surgeon: Runell Gess, MD;  Location: [facility] INVASIVE CV LAB;  Service: Cardiovascular;  Laterality: N/A;  . PERIPHERAL VASCULAR CATHETERIZATION N/A 08/28/2015   Procedure: Abdominal Aortogram;  Surgeon: Runell Gess, MD;  Location: [facility] INVASIVE CV LAB;  Service: Cardiovascular;  Laterality: N/A;  . PERIPHERAL VASCULAR CATHETERIZATION Left 08/28/2015   Procedure: Peripheral Vascular Intervention;  Surgeon: Runell Gess, MD;  Location: [facility] INVASIVE CV LAB;  Service: Cardiovascular;  Laterality:  Left;  common iliac  . PERIPHERAL VASCULAR CATHETERIZATION Left 08/28/2015   Procedure: Peripheral Vascular Atherectomy;  Surgeon: Runell Gess, MD;  Location: [facility] INVASIVE CV LAB;  Service: Cardiovascular;  Laterality: Left;  common iliac  . PERIPHERAL VASCULAR CATHETERIZATION N/A 09/28/2015   Procedure: Lower Extremity Angiography;  Surgeon: Runell Gess, MD;  Location: [facility] INVASIVE CV LAB;  Service: Cardiovascular;  Laterality: N/A;  . PERIPHERAL VASCULAR CATHETERIZATION Left 09/28/2015   Procedure: Peripheral Vascular Intervention;  Surgeon: Runell Gess, MD;  Location: [facility] INVASIVE CV LAB;  Service: Cardiovascular;  Laterality: Left CFA  PCI with 9 mm x 4 cm Abbott nitinol absolute Pro self-expanding stent     . SKIN SPLIT GRAFT Left 12/01/2015   Procedure: LEFT FOOT SKIN GRAFT AND VAC;  Surgeon: [name], MD;  Location: [facility] OR;  Service: Orthopedics;  Laterality: Left;  . TRANSTHORACIC ECHOCARDIOGRAM  10/17/2009   EF 45-50%, moderate hypokinesis of the entire inferolateral myocardium, mild concentric hypertrophy and mild regurg of the mitral valva.  Marland Kitchen VEIN HARVEST Left 11/02/2015   Procedure: LEFT GREATER  SAPHENOUS VEIN HARVEST;  Surgeon: Chuck Hint, MD;  Location: [facility] OR;  Service: Vascular;  Laterality: Left;   Social History   Occupational History  . retired    Social History Main Topics  . Smoking status: Former Smoker    Packs/day: 1.50    Years: 41.00    Types: Cigarettes    Quit date: 10/12/2009  . Smokeless tobacco: Never Used  . Alcohol use No  . Drug use: No  . Sexual activity: Not Currently    Birth control/ protection: Surgical

## 2016-04-10 ENCOUNTER — Ambulatory Visit (INDEPENDENT_AMBULATORY_CARE_PROVIDER_SITE_OTHER): Payer: Medicare Other | Admitting: Vascular Surgery

## 2016-04-10 ENCOUNTER — Ambulatory Visit (HOSPITAL_COMMUNITY)
Admission: RE | Admit: 2016-04-10 | Discharge: 2016-04-10 | Disposition: A | Discharge status: Home / Self Care | Payer: Medicare Other | Source: Ambulatory Visit | Attending: Vascular Surgery | Admitting: Vascular Surgery

## 2016-04-10 ENCOUNTER — Encounter: Payer: Self-pay | Admitting: Vascular Surgery

## 2016-04-10 VITALS — BP 155/68 | HR 77 | Temp 97.8°F | Resp 18 | Ht 64.0 in | Wt 145.0 lb

## 2016-04-10 DIAGNOSIS — I739 Peripheral vascular disease, unspecified: Secondary | ICD-10-CM | POA: Diagnosis not present

## 2016-04-10 DIAGNOSIS — Z48812 Encounter for surgical aftercare following surgery on the circulatory system: Secondary | ICD-10-CM

## 2016-04-10 NOTE — Addendum Note (Signed)
Addended by: Burton Apley A on: 04/10/2016 02:11 PM   Modules accepted: Orders

## 2016-04-10 NOTE — Progress Notes (Signed)
Patient name: ILLYRIA [name] MRN: [id number] DOB: [birth year] Sex: female  REASON FOR VISIT: Follow up after left femoropopliteal bypass.  HPI: [name] [name] is a 66 y.o. female who had presented with a nonhealing wound of her left lower extremity. She had multilevel disease and Dr. Nanetta Batty has addressed her inflow disease on the left. On 11/02/2015, she had a left femoral to below-knee popliteal artery bypass with vein. When I saw her last her graft is working well and Dr. Lajoyce Corners was managing her foot wound. She comes in for a 3 month follow up visit.  She tells me that the wound on her left foot has also completely healed. She currently has a compression stocking on and wished that I did not look at the wound. Her activities fairly limited and I do not get any history of claudication or rest pain.  Past Medical History:  Diagnosis Date  . Anemia 10/2015   Acute Blood Loss  . Arthritis    "feel like I have it all over" (08/28/2015)  . CHF (congestive heart failure) (HCC)   . Complication of anesthesia    DIFFICULT WAKING "only when I was smoking; no problems since I quit"  . Coronary artery disease   . Family history of adverse reaction to anesthesia    sister slow to wake up  . GERD (gastroesophageal reflux disease)    takes Protonix daily   . Hip bursitis   . History of blood transfusion    10/2015  . Hyperlipidemia LDL goal < 70 06/28/2013   takes Atorvastatin daily  . Hypertension    takes Metoprolol and Imdur daily  . Malnutrition (HCC)   . Migraine    "none in a long time" (08/28/2015)  . Myocardial infarction 2011  . Neuromuscular disorder (HCC)    DIABETIC NEUROPATHY  . Osteomyelitis (HCC) 2017   Left foot  . PAD (peripheral artery disease) (HCC)   . Peripheral vascular disease (HCC)   . Respiratory failure (HCC) 10/2015   Acute Hypoxia- acute pulmonary edema 11/13/2015  . Septic shock (HCC) 10/2015  . Stroke (HCC)   . Type II diabetes mellitus (HCC)    takes  Lantus nightly.Average fasting blood sugar runs 80-90  Type II    Family History  Problem Relation Age of Onset  . Hypertension Mother   . Heart failure Mother   . Heart failure Father   . Stroke Father   . Diabetes Father     SOCIAL HISTORY: Social History  Substance Use Topics  . Smoking status: Former Smoker    Packs/day: 1.50    Years: 41.00    Types: Cigarettes    Quit date: 10/12/2009  . Smokeless tobacco: Never Used  . Alcohol use No    Allergies  Allergen Reactions  . Hydrochlorothiazide Other (See Comments)    lethargic   . Latex Rash  . Penicillins Swelling and Rash    Pt states she has tolerated Keflex in the past without problems. States she may have tolerated Augmentin in the past but it caused GI upset. Has patient had a PCN reaction causing immediate rash, facial/tongue/throat swelling, SOB or lightheadedness with hypotension: Yes Has patient had a PCN reaction causing severe rash involving mucus membranes or skin necrosis: No Has patient had a PCN reaction that required hospitalization No Has patient had a PCN reaction occurring within the last 10 years: No    Current Outpatient Prescriptions  Medication Sig Dispense Refill  . amLODipine (NORVASC)  5 MG tablet TAKE 1 TABLET(5 MG) BY MOUTH DAILY 30 tablet 11  . aspirin EC 81 MG tablet Take 81 mg by mouth daily.    Marland Kitchen atorvastatin (LIPITOR) 80 MG tablet Take 1 tablet (80 mg total) by mouth daily. 90 tablet 1  . clopidogrel (PLAVIX) 75 MG tablet Take 1 tablet (75 mg total) by mouth daily. Holding for vascular surgery, restart as soon as possible after leg surgery (after 8/8) 90 tablet 3  . gabapentin (NEURONTIN) 300 MG capsule Take 300 mg by mouth 3 (three) times daily.     . isosorbide mononitrate (IMDUR) 30 MG 24 hr tablet 30 mg. Take 1 tablet along by mouth with 60 mg to equal 90 mg daily.    . isosorbide mononitrate (IMDUR) 60 MG 24 hr tablet 60 mg. Take 1 tablet by mouth along with 30 mg to equal 90 mg  daily    . LEVEMIR FLEXTOUCH 100 UNIT/ML Pen INJECT 30 UNITS INTO THE SKIN HS UTD. DISCONTINUE LANTUS  3  . losartan (COZAAR) 100 MG tablet TAKE 1 TABLET BY MOUTH EVERY DAY 30 tablet 2  . metFORMIN (GLUCOPHAGE) 1000 MG tablet Take 1,000 mg by mouth 2 (two) times daily with a meal.    . metoprolol succinate (TOPROL-XL) 50 MG 24 hr tablet TAKE 1 TABLET(50 MG) BY MOUTH DAILY 90 tablet 5  . ONE TOUCH ULTRA TEST test strip USE TO TEST EVERY DAY AS DIRECTED  9  . pantoprazole (PROTONIX) 40 MG tablet Take 1 tablet (40 mg total) by mouth daily. 30 tablet 10  . polyethylene glycol (MIRALAX / GLYCOLAX) packet Take 17 g by mouth daily as needed. 14 each 0  . furosemide (LASIX) 20 MG tablet Take 1 tablet (20 mg total) by mouth daily as needed for fluid or edema (shortness of breath). (Patient not taking: Reported on 04/10/2016) 30 tablet 0  . Insulin Glargine (LANTUS) 100 UNIT/ML Solostar Pen Inject 25 Units into the skin daily at 10 pm.     . ketoconazole (NIZORAL) 2 % cream Apply 1 application topically daily. Apply daily to lower extremities    . oxyCODONE-acetaminophen (PERCOCET/ROXICET) 5-325 MG tablet Take 2 tablets by mouth every 6 (six) hours as needed for moderate pain. (Patient not taking: Reported on 04/10/2016) 15 tablet 0   No current facility-administered medications for this visit.     REVIEW OF SYSTEMS:  [X]  denotes positive finding, [ ]  denotes negative finding Cardiac  Comments:  Chest pain or chest pressure:    Shortness of breath upon exertion:    Short of breath when lying flat:    Irregular heart rhythm:        Vascular    Pain in calf, thigh, or hip brought on by ambulation:    Pain in feet at night that wakes you up from your sleep:     Blood clot in your veins:    Leg swelling:  X       Pulmonary    Oxygen at home:    Productive cough:     Wheezing:         Neurologic    Sudden weakness in arms or legs:     Sudden numbness in arms or legs:     Sudden onset of  difficulty speaking or slurred speech:    Temporary loss of vision in one eye:     Problems with dizziness:         Gastrointestinal    Blood in stool:  Vomited blood:         Genitourinary    Burning when urinating:     Blood in urine:        Psychiatric    Major depression:         Hematologic    Bleeding problems:    Problems with blood clotting too easily:        Skin    Rashes or ulcers:        Constitutional    Fever or chills:      PHYSICAL EXAM: Vitals:   04/10/16 0933 04/10/16 0934  BP: (!) 155/71 (!) 155/68  Pulse: 77   Resp: 18   Temp: 97.8 F (36.6 C)   TempSrc: Oral   SpO2: 99%   Weight: 145 lb (65.8 kg)   Height: 5\' 4"  (1.626 m)     GENERAL: The patient is a well-nourished female, in no acute distress. The vital signs are documented above. CARDIAC: There is a regular rate and rhythm.  VASCULAR: She has bilateral carotid bruits. PULMONARY: There is good air exchange bilaterally without wheezing or rales. ABDOMEN: Soft and non-tender with normal pitched bowel sounds.  MUSCULOSKELETAL: There are no major deformities or cyanosis. NEUROLOGIC: No focal weakness or paresthesias are detected. SKIN: She asked that I did not examine the wound on her left foot since she has a dressing on this. PSYCHIATRIC: The patient has a normal affect.  DATA:   DUPLEX LEFT FEMOROPOPLITEAL BYPASS GRAFT: I independently interpreted her duplex of her bypass graft in the left leg. Her bypass graft is widely patent with no areas of stenosis identified within the graft.  MEDICAL ISSUES:  STATUS POST LEFT FEMOROPOPLITEAL BYPASS WITH VEIN: Her bypass graft is patent without any areas of restenosis noted. I were to follow up graft duplex in 6 months and she will see our nurse practitioner at that time. Her ABIs are done at Dr. Clayborne Dana office. She is on aspirin and is on a statin. She has successfully quit smoking.  BILATERAL CAROTID BRUITS: Her carotid Doppler  studies are done at Dr. Clayborne Dana office. Her study in September 2017 showed a less than 39% left carotid stenosis with a known occlusion of the right internal carotid artery.  Waverly Ferrari [facility] [phone]

## 2016-04-16 ENCOUNTER — Ambulatory Visit (INDEPENDENT_AMBULATORY_CARE_PROVIDER_SITE_OTHER): Payer: Medicare Other | Admitting: Orthopedic Surgery

## 2016-04-16 ENCOUNTER — Encounter (INDEPENDENT_AMBULATORY_CARE_PROVIDER_SITE_OTHER): Payer: Self-pay | Admitting: Orthopedic Surgery

## 2016-04-16 VITALS — Ht 64.0 in | Wt 145.0 lb

## 2016-04-16 DIAGNOSIS — L97421 Non-pressure chronic ulcer of left heel and midfoot limited to breakdown of skin: Secondary | ICD-10-CM

## 2016-04-16 NOTE — Progress Notes (Signed)
Office Visit Note   Patient: [name] [name]           Date of Birth: 04-Dec-1950           MRN: [id number] Visit Date: 04/16/2016              Requested by: Georgann Housekeeper, MD [address] [place], [place] [address] PCP: Georgann Housekeeper, MD  Chief Complaint  Patient presents with  . Left Foot - Follow-up    Left foot skin graft and wound vac 12/01/15    GEX:[id number] denies any pain or problems or concerns at this time she denies any fever or chills she is pleased with her progress use been wearing a compression sock 24 hours a day 7 days a week status post skin graft on 12/01/2015. HPI  Assessment & Plan: Visit Diagnoses:  1. Midfoot ulcer, left, limited to breakdown of skin (HCC)     Plan: We'll have her continue wearing the medical compression socks she will change this daily where around-the-clock. She is given a prescription for new socks.  Follow-Up Instructions: Return in about 4 weeks (around 05/14/2016).   Ortho Exam On examination patient is alert oriented no adenopathy well-dressed normal affect normal respiratory effort she is an bleeding in a wheelchair. Patient has venous stasis changes in both legs with brawny skin color changes the left leg has a circumference of 35 cm right leg has a circumference of 37 cm. There is pitting edema up to the tibial tubercle worse in the left than the right. Patient has a palpable pulse the plantar aspect of the left leg shows excellent healing there is no redness no synovitis no drainage no signs of infection there is 100% beefy granulation tissue the wound is 5 mm x 15 mm an 0.1 mm deep. ROS:  All other review of systems negative except as mentioned in history of present illness. Imaging: No results found.  Labs: Lab Results  Component Value Date   HGBA1C 7.6 (H) 11/02/2015   HGBA1C 8.3 (H) 09/01/2015   HGBA1C 13.2 (H) 03/03/2013   ESRSEDRATE 44 (H) 01/02/2016   ESRSEDRATE 135 (H) 11/02/2015   CRP 5.1 01/02/2016   CRP  3.7 (H) 11/02/2015   REPTSTATUS 11/15/2015 FINAL 11/10/2015   REPTSTATUS 11/15/2015 FINAL 11/10/2015   GRAMSTAIN  11/10/2015    ABUNDANT WBC PRESENT, PREDOMINANTLY PMN NO ORGANISMS SEEN    GRAMSTAIN  11/10/2015    ABUNDANT WBC PRESENT, PREDOMINANTLY PMN RARE GRAM POSITIVE COCCI IN PAIRS    CULT  11/10/2015    FEW GROUP B STREP(S.AGALACTIAE)ISOLATED TESTING AGAINST S. AGALACTIAE NOT ROUTINELY PERFORMED DUE TO PREDICTABILITY OF AMP/PEN/VAN SUSCEPTIBILITY. NO ANAEROBES ISOLATED    CULT  11/10/2015    RARE GROUP B STREP(S.AGALACTIAE)ISOLATED TESTING AGAINST S. AGALACTIAE NOT ROUTINELY PERFORMED DUE TO PREDICTABILITY OF AMP/PEN/VAN SUSCEPTIBILITY. NO ANAEROBES ISOLATED     Orders:  No orders of the defined types were placed in this encounter.  No orders of the defined types were placed in this encounter.    Procedures: No procedures performed  Clinical Data: No additional findings.  Subjective: Review of Systems  Objective: Vital Signs: Ht 5\' 4"  (1.626 m)   Wt 145 lb (65.8 kg)   BMI 24.89 kg/m   Specialty Comments:  No specialty comments available.  PMFS History: Patient Active Problem List   Diagnosis Date Noted  . Midfoot ulcer, left, limited to breakdown of skin (HCC) 01/08/2016  . Thigh hematoma 12/22/2015  . History of blood transfusion  12/22/2015  . Acute on chronic systolic CHF (congestive heart failure) (HCC) 12/22/2015  . H/O skin graft 12/11/2015  . NSTEMI (non-ST elevated myocardial infarction) (HCC) 11/19/2015  . DM type 2 with diabetic peripheral neuropathy (HCC) 11/19/2015  . Traumatic hemorrhagic shock (HCC)   . Chronic systolic CHF (congestive heart failure) (HCC)   . Uncontrolled type 2 diabetes mellitus with complication (HCC)   . Demand myocardial infarction 11/08/2015  . Surgery, elective   . Central line infection, initial encounter   . Septic shock (HCC) 11/03/2015  . Encephalopathy acute 11/03/2015  . Acute blood loss anemia  11/03/2015  . Acute respiratory failure with hypoxia (HCC)   . CVA (cerebral vascular accident) (HCC)   . S/P femoral-popliteal bypass surgery   . Malnutrition of moderate degree 11/01/2015  . Diabetic ulcer of left midfoot associated with diabetes mellitus due to underlying condition, with muscle involvement without evidence of necrosis (HCC)   . Hypertensive heart disease with chronic diastolic congestive heart failure (HCC) 10/31/2015  . Osteomyelitis of left foot (HCC) 10/30/2015  . PVD (peripheral vascular disease) with claudication (HCC) 09/28/2015  . Critical lower limb ischemia 08/28/2015  . Hyperlipidemia with target LDL less than 70 06/28/2013  . Diabetes mellitus type II, uncontrolled (HCC) 03/03/2013  . Complicated migraine 03/03/2013  . Stroke-like symptom 03/02/2013  . Diabetes mellitus (HCC) 03/02/2013  . Aphasia 03/02/2013  . Headache(784.0) 03/02/2013  . PAD (peripheral artery disease) (HCC) 06/05/2011  . Coronary artery disease involving native coronary artery of native heart without angina pectoris 06/05/2011  . Hypotension 06/05/2011  . Claudication in peripheral vascular disease:  Lifestyle limiting. 06/05/2011  . Hx of tobacco use, presenting hazards to health 06/05/2011   Past Medical History:  Diagnosis Date  . Anemia 10/2015   Acute Blood Loss  . Arthritis    "feel like I have it all over" (08/28/2015)  . CHF (congestive heart failure) (HCC)   . Complication of anesthesia    DIFFICULT WAKING "only when I was smoking; no problems since I quit"  . Coronary artery disease   . Family history of adverse reaction to anesthesia    sister slow to wake up  . GERD (gastroesophageal reflux disease)    takes Protonix daily   . Hip bursitis   . History of blood transfusion    10/2015  . Hyperlipidemia LDL goal < 70 06/28/2013   takes Atorvastatin daily  . Hypertension    takes Metoprolol and Imdur daily  . Malnutrition (HCC)   . Migraine    "none in a long  time" (08/28/2015)  . Myocardial infarction 2011  . Neuromuscular disorder (HCC)    DIABETIC NEUROPATHY  . Osteomyelitis (HCC) 2017   Left foot  . PAD (peripheral artery disease) (HCC)   . Peripheral vascular disease (HCC)   . Respiratory failure (HCC) 10/2015   Acute Hypoxia- acute pulmonary edema 11/13/2015  . Septic shock (HCC) 10/2015  . Stroke (HCC)   . Type II diabetes mellitus (HCC)    takes Lantus nightly.Average fasting blood sugar runs 80-90  Type II    Family History  Problem Relation [name] of Onset  . Hypertension Mother   . Heart failure Mother   . Heart failure Father   . Stroke Father   . Diabetes Father     Past Surgical History:  Procedure Laterality Date  . ABDOMINAL HYSTERECTOMY    . APPENDECTOMY    . ATHERECTOMY N/A 06/04/2011   Procedure: ATHERECTOMY;  Surgeon: Delton See  Allyson Sabal, MD;  Location: [facility] CATH LAB;  Service: Cardiovascular;  Laterality: N/A;  . CARDIAC CATHETERIZATION  10/13/2009   95% stenosis in the AV groove circumflex and 95% ostial stenosis in small OM3. A 3x37mm drug-eluting Promus stent inserted ito the circumflex. Dilatated with a 3.25x6mm noncompliant Quantum balloon within entire segment. The entire region was reduced to 0% and brisk TIMI3 flow.  . CAROTID DUPLEX  03/19/2011   Right ICA-demonstrates complete occlusion. Left ICA-demonstrates a small amount of fibrous plaque.  Marland Kitchen CATARACT EXTRACTION W/ INTRAOCULAR LENS IMPLANT Right   . CESAREAN SECTION  1990  . CORONARY ANGIOPLASTY    . ENDARTERECTOMY FEMORAL Left 09/05/2015   Procedure: ENDARTERECTOMY FEMORAL WITH PROFUNDOPLASTY;  Surgeon: Chuck Hint, MD;  Location: [facility] OR;  Service: Vascular;  Laterality: Left;  Left common femoral artery vein patch using left saphenous vien  . FEMORAL-POPLITEAL BYPASS GRAFT Left 11/02/2015   Procedure: BYPASS GRAFT FEMORAL-POPLITEAL ARTERY VS FEMORAL-TIBIAL ARTERY BYPASS;  Surgeon: Chuck Hint, MD;  Location: [facility] OR;  Service: Vascular;   Laterality: Left;  . I&D EXTREMITY Left 11/10/2015   Procedure: Debridement Left Foot Ulcer, Application  Wound VAC;  Surgeon: [name], MD;  Location: [facility] OR;  Service: Orthopedics;  Laterality: Left;  . ILIAC ARTERY STENT Left 08/28/2015   common  . INTRAOPERATIVE ARTERIOGRAM Left 09/05/2015   Procedure: INTRA OPERATIVE ARTERIOGRAM;  Surgeon: Chuck Hint, MD;  Location: [facility] OR;  Service: Vascular;  Laterality: Left;  . INTRAOPERATIVE ARTERIOGRAM Left 11/02/2015   Procedure: INTRA OPERATIVE ARTERIOGRAM;  Surgeon: Chuck Hint, MD;  Location: [facility] OR;  Service: Vascular;  Laterality: Left;  . LEXISCAN MYOVIEW  10/25/2010   Moderate perfusion defect due to infarct/scar with mild perinfarct ischemia seen in the Basal Inferolateral, Basal Anterolateral, Mid Inferolateral, and Mid Anterolateral regions. Post-stress EF is 50%.  . OVARY SURGERY  1983?   "ruptured"  . PERIPHERAL VASCULAR ANGIOGRAM  01/26/2010   High-grade SFA disease: left greater than right. Left SFA would require fem-pop bypass grafting. Right SFA could be stented but might require Diamondback Orbital atherectomy.  Marland Kitchen PERIPHERAL VASCULAR ANGIOGRAM  02/23/2010   Stealth Predator orbital rotational atherectomy performed on SFA & Popliteal up to 90,000 RPM. Stenting using overlapping 5x169mm and 5x72mm Absolute Pro Nitinol self-expanding stents beginning just at the knee up to the mid SFA resulting in reduction of 90-95% calcified SFA & Popliteal stenosis to 0. Stenting performed on the distal common & proximal iliac artery with a 10x4 Absolute Pro- 70-0%.  Marland Kitchen PERIPHERAL VASCULAR ANGIOGRAM  06/17/2010   PTA performed to the right external iliac artery stent using a 5x100 balloon at 10 atmospheres. Stenting performed using a 6x18 Genesis on Opta balloon. Postdilatation with a 7x2 balloon resulting in a 95% "in-stent" stenosis to 0% residual.  . PERIPHERAL VASCULAR ANGIOGRAM  06/04/2011   Bilateral total SFAs not percutaneously  addressable. Good canidate for femoropopliteal bypass grafting  . PERIPHERAL VASCULAR ANGIOGRAM  08/28/2015  . PERIPHERAL VASCULAR CATHETERIZATION N/A 08/28/2015   Procedure: Lower Extremity Angiography;  Surgeon: Runell Gess, MD;  Location: [facility] INVASIVE CV LAB;  Service: Cardiovascular;  Laterality: N/A;  . PERIPHERAL VASCULAR CATHETERIZATION N/A 08/28/2015   Procedure: Abdominal Aortogram;  Surgeon: Runell Gess, MD;  Location: [facility] INVASIVE CV LAB;  Service: Cardiovascular;  Laterality: N/A;  . PERIPHERAL VASCULAR CATHETERIZATION Left 08/28/2015   Procedure: Peripheral Vascular Intervention;  Surgeon: Runell Gess, MD;  Location: [facility] INVASIVE CV LAB;  Service: Cardiovascular;  Laterality: Left;  common iliac  . PERIPHERAL VASCULAR CATHETERIZATION Left 08/28/2015   Procedure: Peripheral Vascular Atherectomy;  Surgeon: Runell Gess, MD;  Location: [facility] INVASIVE CV LAB;  Service: Cardiovascular;  Laterality: Left;  common iliac  . PERIPHERAL VASCULAR CATHETERIZATION N/A 09/28/2015   Procedure: Lower Extremity Angiography;  Surgeon: Runell Gess, MD;  Location: [facility] INVASIVE CV LAB;  Service: Cardiovascular;  Laterality: N/A;  . PERIPHERAL VASCULAR CATHETERIZATION Left 09/28/2015   Procedure: Peripheral Vascular Intervention;  Surgeon: Runell Gess, MD;  Location: [facility] INVASIVE CV LAB;  Service: Cardiovascular;  Laterality: Left CFA  PCI with 9 mm x 4 cm Abbott nitinol absolute Pro self-expanding stent     . SKIN SPLIT GRAFT Left 12/01/2015   Procedure: LEFT FOOT SKIN GRAFT AND VAC;  Surgeon: [name], MD;  Location: [facility] OR;  Service: Orthopedics;  Laterality: Left;  . TRANSTHORACIC ECHOCARDIOGRAM  10/17/2009   EF 45-50%, moderate hypokinesis of the entire inferolateral myocardium, mild concentric hypertrophy and mild regurg of the mitral valva.  Marland Kitchen VEIN HARVEST Left 11/02/2015   Procedure: LEFT GREATER SAPHENOUS VEIN HARVEST;  Surgeon: Chuck Hint, MD;  Location: [facility] OR;   Service: Vascular;  Laterality: Left;   Social History   Occupational History  . retired    Social History Main Topics  . Smoking status: Former Smoker    Packs/day: 1.50    Years: 41.00    Types: Cigarettes    Quit date: 10/12/2009  . Smokeless tobacco: Never Used  . Alcohol use No  . Drug use: No  . Sexual activity: Not Currently    Birth control/ protection: Surgical

## 2016-04-17 ENCOUNTER — Other Ambulatory Visit: Payer: Self-pay | Admitting: *Deleted

## 2016-04-17 MED ORDER — PANTOPRAZOLE SODIUM 40 MG PO TBEC: 40.0000 mg | DELAYED_RELEASE_TABLET | Freq: Every day | ORAL | 3 refills | Status: DC

## 2016-05-13 ENCOUNTER — Ambulatory Visit (INDEPENDENT_AMBULATORY_CARE_PROVIDER_SITE_OTHER): Payer: Medicare Other | Admitting: Family

## 2016-05-13 ENCOUNTER — Ambulatory Visit (INDEPENDENT_AMBULATORY_CARE_PROVIDER_SITE_OTHER): Payer: Medicare Other | Admitting: Orthopedic Surgery

## 2016-05-15 ENCOUNTER — Ambulatory Visit (INDEPENDENT_AMBULATORY_CARE_PROVIDER_SITE_OTHER): Payer: Medicare Other | Admitting: Family

## 2016-05-22 ENCOUNTER — Other Ambulatory Visit: Payer: Self-pay | Admitting: Cardiovascular Disease

## 2016-05-31 ENCOUNTER — Ambulatory Visit
Admission: RE | Admit: 2016-05-31 | Discharge: 2016-05-31 | Disposition: A | Discharge status: Home / Self Care | Payer: Medicare Other | Source: Ambulatory Visit | Attending: Internal Medicine | Admitting: Internal Medicine

## 2016-05-31 ENCOUNTER — Other Ambulatory Visit: Payer: Self-pay | Admitting: Internal Medicine

## 2016-05-31 DIAGNOSIS — R0602 Shortness of breath: Secondary | ICD-10-CM

## 2016-05-31 DIAGNOSIS — E1159 Type 2 diabetes mellitus with other circulatory complications: Secondary | ICD-10-CM | POA: Diagnosis not present

## 2016-05-31 DIAGNOSIS — I509 Heart failure, unspecified: Secondary | ICD-10-CM | POA: Diagnosis not present

## 2016-05-31 DIAGNOSIS — R600 Localized edema: Secondary | ICD-10-CM | POA: Diagnosis not present

## 2016-05-31 DIAGNOSIS — Z794 Long term (current) use of insulin: Secondary | ICD-10-CM | POA: Diagnosis not present

## 2016-05-31 DIAGNOSIS — R05 Cough: Secondary | ICD-10-CM | POA: Diagnosis not present

## 2016-06-05 ENCOUNTER — Encounter (INDEPENDENT_AMBULATORY_CARE_PROVIDER_SITE_OTHER): Payer: Self-pay | Admitting: Family

## 2016-06-05 ENCOUNTER — Ambulatory Visit (INDEPENDENT_AMBULATORY_CARE_PROVIDER_SITE_OTHER): Payer: Medicare Other | Admitting: Family

## 2016-06-05 VITALS — Ht 64.0 in | Wt 145.0 lb

## 2016-06-05 DIAGNOSIS — Z945 Skin transplant status: Secondary | ICD-10-CM

## 2016-06-05 DIAGNOSIS — L97425 Non-pressure chronic ulcer of left heel and midfoot with muscle involvement without evidence of necrosis: Secondary | ICD-10-CM | POA: Diagnosis not present

## 2016-06-05 DIAGNOSIS — B351 Tinea unguium: Secondary | ICD-10-CM

## 2016-06-05 DIAGNOSIS — Z9889 Other specified postprocedural states: Secondary | ICD-10-CM

## 2016-06-05 DIAGNOSIS — E08621 Diabetes mellitus due to underlying condition with foot ulcer: Secondary | ICD-10-CM | POA: Diagnosis not present

## 2016-06-06 ENCOUNTER — Other Ambulatory Visit: Payer: Self-pay | Admitting: Cardiovascular Disease

## 2016-06-06 NOTE — Progress Notes (Signed)
Office Visit Note   Patient: [name] [name]           Date of Birth: May 22, 1950           MRN: [id number] Visit Date: 06/05/2016              Requested by: Georgann Housekeeper, MD [address] [place], [place] [address] PCP: Georgann Housekeeper, MD  Chief Complaint  Patient presents with  . Left Foot - Follow-up    12/01/15 left foot skin graft and vac    PPH:[id number] denies any pain or problems or concerns at this time she denies any fever or chills she is pleased with her progress use been wearing a compression sock 24 hours a day 7 days a week status post skin graft on 12/01/2015. Is concerned about some recent bleeding she has had.  HPI  Assessment & Plan: Visit Diagnoses:  1. H/O skin graft   2. Diabetic ulcer of left midfoot associated with diabetes mellitus due to underlying condition, with muscle involvement without evidence of necrosis (HCC)   3. Onychomycosis     Plan: We'll have her continue wearing the medical compression socks she will change this daily. Wear around-the-clock. Continue to minimize weight bearing until healed.   Follow-Up Instructions: Return in about 4 weeks (around 07/03/2016).   Ortho Exam On examination patient is alert oriented no adenopathy well-dressed normal affect normal respiratory effort she is an bleeding in a wheelchair. There is mild pitting edema up to the tibial tubercle worse in the left than the right. Patient has a palpable pulse the plantar aspect of the left leg shows excellent healing there is no redness no synovitis no drainage no signs of infection there is 100% beefy granulation tissue the wound is 5 cm x 10 mm an 0.1 mm deep. ROS:  All other review of systems negative except as mentioned in history of present illness. Imaging: No results found.  Labs: Lab Results  Component Value Date   HGBA1C 7.6 (H) 11/02/2015   HGBA1C 8.3 (H) 09/01/2015   HGBA1C 13.2 (H) 03/03/2013   ESRSEDRATE 44 (H) 01/02/2016   ESRSEDRATE 135  (H) 11/02/2015   CRP 5.1 01/02/2016   CRP 3.7 (H) 11/02/2015   REPTSTATUS 11/15/2015 FINAL 11/10/2015   REPTSTATUS 11/15/2015 FINAL 11/10/2015   GRAMSTAIN  11/10/2015    ABUNDANT WBC PRESENT, PREDOMINANTLY PMN NO ORGANISMS SEEN    GRAMSTAIN  11/10/2015    ABUNDANT WBC PRESENT, PREDOMINANTLY PMN RARE GRAM POSITIVE COCCI IN PAIRS    CULT  11/10/2015    FEW GROUP B STREP(S.AGALACTIAE)ISOLATED TESTING AGAINST S. AGALACTIAE NOT ROUTINELY PERFORMED DUE TO PREDICTABILITY OF AMP/PEN/VAN SUSCEPTIBILITY. NO ANAEROBES ISOLATED    CULT  11/10/2015    RARE GROUP B STREP(S.AGALACTIAE)ISOLATED TESTING AGAINST S. AGALACTIAE NOT ROUTINELY PERFORMED DUE TO PREDICTABILITY OF AMP/PEN/VAN SUSCEPTIBILITY. NO ANAEROBES ISOLATED     Orders:  No orders of the defined types were placed in this encounter.  No orders of the defined types were placed in this encounter.    Procedures: No procedures performed  Clinical Data: No additional findings.  Subjective: Review of Systems  Constitutional: Negative for chills and fever.  Skin: Positive for wound. Negative for color change.    Objective: Vital Signs: Ht 5\' 4"  (1.626 m)   Wt 145 lb (65.8 kg)   BMI 24.89 kg/m   Specialty Comments:  No specialty comments available.  PMFS History: Patient Active Problem List   Diagnosis Date Noted  .  Midfoot ulcer, left, limited to breakdown of skin (HCC) 01/08/2016  . Thigh hematoma 12/22/2015  . History of blood transfusion 12/22/2015  . Acute on chronic systolic CHF (congestive heart failure) (HCC) 12/22/2015  . H/O skin graft 12/11/2015  . NSTEMI (non-ST elevated myocardial infarction) (HCC) 11/19/2015  . DM type 2 with diabetic peripheral neuropathy (HCC) 11/19/2015  . Traumatic hemorrhagic shock (HCC)   . Chronic systolic CHF (congestive heart failure) (HCC)   . Uncontrolled type 2 diabetes mellitus with complication (HCC)   . Demand myocardial infarction (HCC) 11/08/2015  . Surgery,  elective   . Central line infection, initial encounter   . Septic shock (HCC) 11/03/2015  . Encephalopathy acute 11/03/2015  . Acute blood loss anemia 11/03/2015  . Acute respiratory failure with hypoxia (HCC)   . CVA (cerebral vascular accident) (HCC)   . S/P femoral-popliteal bypass surgery   . Malnutrition of moderate degree 11/01/2015  . Diabetic ulcer of left midfoot associated with diabetes mellitus due to underlying condition, with muscle involvement without evidence of necrosis (HCC)   . Hypertensive heart disease with chronic diastolic congestive heart failure (HCC) 10/31/2015  . Osteomyelitis of left foot (HCC) 10/30/2015  . PVD (peripheral vascular disease) with claudication (HCC) 09/28/2015  . Critical lower limb ischemia 08/28/2015  . Hyperlipidemia with target LDL less than 70 06/28/2013  . Diabetes mellitus type II, uncontrolled (HCC) 03/03/2013  . Complicated migraine 03/03/2013  . Stroke-like symptom 03/02/2013  . Diabetes mellitus (HCC) 03/02/2013  . Aphasia 03/02/2013  . Headache(784.0) 03/02/2013  . PAD (peripheral artery disease) (HCC) 06/05/2011  . Coronary artery disease involving native coronary artery of native heart without angina pectoris 06/05/2011  . Hypotension 06/05/2011  . Claudication in peripheral vascular disease:  Lifestyle limiting. 06/05/2011  . Hx of tobacco use, presenting hazards to health 06/05/2011   Past Medical History:  Diagnosis Date  . Anemia 10/2015   Acute Blood Loss  . Arthritis    "feel like I have it all over" (08/28/2015)  . CHF (congestive heart failure) (HCC)   . Complication of anesthesia    DIFFICULT WAKING "only when I was smoking; no problems since I quit"  . Coronary artery disease   . Family history of adverse reaction to anesthesia    sister slow to wake up  . GERD (gastroesophageal reflux disease)    takes Protonix daily   . Hip bursitis   . History of blood transfusion    10/2015  . Hyperlipidemia LDL goal <  70 06/28/2013   takes Atorvastatin daily  . Hypertension    takes Metoprolol and Imdur daily  . Malnutrition (HCC)   . Migraine    "none in a long time" (08/28/2015)  . Myocardial infarction (HCC) 2011  . Neuromuscular disorder (HCC)    DIABETIC NEUROPATHY  . Osteomyelitis (HCC) 2017   Left foot  . PAD (peripheral artery disease) (HCC)   . Peripheral vascular disease (HCC)   . Respiratory failure (HCC) 10/2015   Acute Hypoxia- acute pulmonary edema 11/13/2015  . Septic shock (HCC) 10/2015  . Stroke (HCC)   . Type II diabetes mellitus (HCC)    takes Lantus nightly.Average fasting blood sugar runs 80-90  Type II    Family History  Problem Relation [name] of Onset  . Hypertension Mother   . Heart failure Mother   . Heart failure Father   . Stroke Father   . Diabetes Father     Past Surgical History:  Procedure Laterality Date  .  ABDOMINAL HYSTERECTOMY    . APPENDECTOMY    . ATHERECTOMY N/A 06/04/2011   Procedure: ATHERECTOMY;  Surgeon: Runell Gess, MD;  Location: [facility] CATH LAB;  Service: Cardiovascular;  Laterality: N/A;  . CARDIAC CATHETERIZATION  10/13/2009   95% stenosis in the AV groove circumflex and 95% ostial stenosis in small OM3. A 3x20mm drug-eluting Promus stent inserted ito the circumflex. Dilatated with a 3.25x69mm noncompliant Quantum balloon within entire segment. The entire region was reduced to 0% and brisk TIMI3 flow.  . CAROTID DUPLEX  03/19/2011   Right ICA-demonstrates complete occlusion. Left ICA-demonstrates a small amount of fibrous plaque.  Marland Kitchen CATARACT EXTRACTION W/ INTRAOCULAR LENS IMPLANT Right   . CESAREAN SECTION  1990  . CORONARY ANGIOPLASTY    . ENDARTERECTOMY FEMORAL Left 09/05/2015   Procedure: ENDARTERECTOMY FEMORAL WITH PROFUNDOPLASTY;  Surgeon: Chuck Hint, MD;  Location: [facility] OR;  Service: Vascular;  Laterality: Left;  Left common femoral artery vein patch using left saphenous vien  . FEMORAL-POPLITEAL BYPASS GRAFT Left 11/02/2015    Procedure: BYPASS GRAFT FEMORAL-POPLITEAL ARTERY VS FEMORAL-TIBIAL ARTERY BYPASS;  Surgeon: Chuck Hint, MD;  Location: [facility] OR;  Service: Vascular;  Laterality: Left;  . I&D EXTREMITY Left 11/10/2015   Procedure: Debridement Left Foot Ulcer, Application  Wound VAC;  Surgeon: [name], MD;  Location: [facility] OR;  Service: Orthopedics;  Laterality: Left;  . ILIAC ARTERY STENT Left 08/28/2015   common  . INTRAOPERATIVE ARTERIOGRAM Left 09/05/2015   Procedure: INTRA OPERATIVE ARTERIOGRAM;  Surgeon: Chuck Hint, MD;  Location: [facility] OR;  Service: Vascular;  Laterality: Left;  . INTRAOPERATIVE ARTERIOGRAM Left 11/02/2015   Procedure: INTRA OPERATIVE ARTERIOGRAM;  Surgeon: Chuck Hint, MD;  Location: [facility] OR;  Service: Vascular;  Laterality: Left;  . LEXISCAN MYOVIEW  10/25/2010   Moderate perfusion defect due to infarct/scar with mild perinfarct ischemia seen in the Basal Inferolateral, Basal Anterolateral, Mid Inferolateral, and Mid Anterolateral regions. Post-stress EF is 50%.  . OVARY SURGERY  1983?   "ruptured"  . PERIPHERAL VASCULAR ANGIOGRAM  01/26/2010   High-grade SFA disease: left greater than right. Left SFA would require fem-pop bypass grafting. Right SFA could be stented but might require Diamondback Orbital atherectomy.  Marland Kitchen PERIPHERAL VASCULAR ANGIOGRAM  02/23/2010   Stealth Predator orbital rotational atherectomy performed on SFA & Popliteal up to 90,000 RPM. Stenting using overlapping 5x165mm and 5x53mm Absolute Pro Nitinol self-expanding stents beginning just at the knee up to the mid SFA resulting in reduction of 90-95% calcified SFA & Popliteal stenosis to 0. Stenting performed on the distal common & proximal iliac artery with a 10x4 Absolute Pro- 70-0%.  Marland Kitchen PERIPHERAL VASCULAR ANGIOGRAM  06/17/2010   PTA performed to the right external iliac artery stent using a 5x100 balloon at 10 atmospheres. Stenting performed using a 6x18 Genesis on Opta balloon. Postdilatation with  a 7x2 balloon resulting in a 95% "in-stent" stenosis to 0% residual.  . PERIPHERAL VASCULAR ANGIOGRAM  06/04/2011   Bilateral total SFAs not percutaneously addressable. Good canidate for femoropopliteal bypass grafting  . PERIPHERAL VASCULAR ANGIOGRAM  08/28/2015  . PERIPHERAL VASCULAR CATHETERIZATION N/A 08/28/2015   Procedure: Lower Extremity Angiography;  Surgeon: Runell Gess, MD;  Location: [facility] INVASIVE CV LAB;  Service: Cardiovascular;  Laterality: N/A;  . PERIPHERAL VASCULAR CATHETERIZATION N/A 08/28/2015   Procedure: Abdominal Aortogram;  Surgeon: Runell Gess, MD;  Location: [facility] INVASIVE CV LAB;  Service: Cardiovascular;  Laterality: N/A;  . PERIPHERAL VASCULAR CATHETERIZATION Left 08/28/2015  Procedure: Peripheral Vascular Intervention;  Surgeon: Runell Gess, MD;  Location: [facility] INVASIVE CV LAB;  Service: Cardiovascular;  Laterality: Left;  common iliac  . PERIPHERAL VASCULAR CATHETERIZATION Left 08/28/2015   Procedure: Peripheral Vascular Atherectomy;  Surgeon: Runell Gess, MD;  Location: [facility] INVASIVE CV LAB;  Service: Cardiovascular;  Laterality: Left;  common iliac  . PERIPHERAL VASCULAR CATHETERIZATION N/A 09/28/2015   Procedure: Lower Extremity Angiography;  Surgeon: Runell Gess, MD;  Location: [facility] INVASIVE CV LAB;  Service: Cardiovascular;  Laterality: N/A;  . PERIPHERAL VASCULAR CATHETERIZATION Left 09/28/2015   Procedure: Peripheral Vascular Intervention;  Surgeon: Runell Gess, MD;  Location: [facility] INVASIVE CV LAB;  Service: Cardiovascular;  Laterality: Left CFA  PCI with 9 mm x 4 cm Abbott nitinol absolute Pro self-expanding stent     . SKIN SPLIT GRAFT Left 12/01/2015   Procedure: LEFT FOOT SKIN GRAFT AND VAC;  Surgeon: [name], MD;  Location: [facility] OR;  Service: Orthopedics;  Laterality: Left;  . TRANSTHORACIC ECHOCARDIOGRAM  10/17/2009   EF 45-50%, moderate hypokinesis of the entire inferolateral myocardium, mild concentric hypertrophy and mild regurg of the  mitral valva.  Marland Kitchen VEIN HARVEST Left 11/02/2015   Procedure: LEFT GREATER SAPHENOUS VEIN HARVEST;  Surgeon: Chuck Hint, MD;  Location: [facility] OR;  Service: Vascular;  Laterality: Left;   Social History   Occupational History  . retired    Social History Main Topics  . Smoking status: Former Smoker    Packs/day: 1.50    Years: 41.00    Types: Cigarettes    Quit date: 10/12/2009  . Smokeless tobacco: Never Used  . Alcohol use No  . Drug use: No  . Sexual activity: Not Currently    Birth control/ protection: Surgical

## 2016-06-11 ENCOUNTER — Encounter: Payer: Self-pay | Admitting: Cardiovascular Disease

## 2016-06-11 ENCOUNTER — Ambulatory Visit (INDEPENDENT_AMBULATORY_CARE_PROVIDER_SITE_OTHER): Payer: Medicare Other | Admitting: Cardiovascular Disease

## 2016-06-11 VITALS — BP 157/70 | HR 83 | Ht 64.0 in | Wt 146.0 lb

## 2016-06-11 DIAGNOSIS — I10 Essential (primary) hypertension: Secondary | ICD-10-CM

## 2016-06-11 DIAGNOSIS — E118 Type 2 diabetes mellitus with unspecified complications: Secondary | ICD-10-CM | POA: Diagnosis not present

## 2016-06-11 DIAGNOSIS — I2510 Atherosclerotic heart disease of native coronary artery without angina pectoris: Secondary | ICD-10-CM | POA: Diagnosis not present

## 2016-06-11 DIAGNOSIS — Z79899 Other long term (current) drug therapy: Secondary | ICD-10-CM | POA: Diagnosis not present

## 2016-06-11 DIAGNOSIS — R0609 Other forms of dyspnea: Secondary | ICD-10-CM | POA: Diagnosis not present

## 2016-06-11 DIAGNOSIS — E785 Hyperlipidemia, unspecified: Secondary | ICD-10-CM | POA: Diagnosis not present

## 2016-06-11 MED ORDER — METOPROLOL SUCCINATE ER 50 MG PO TB24: ORAL_TABLET | ORAL | 5 refills | Status: DC

## 2016-06-11 NOTE — Patient Instructions (Addendum)
Medication Instructions:  INCREASE YOUR METOPROLOL SUCC (TOPROL) TO 1 AND 1/2 TABLETS DAILY   Labwork: FASTING LP/CMET/TSH/CBC SOON AT [facility] ON THE FIRST FLOOR  Testing/Procedures: Your physician has requested that you have a lexiscan myoview. For further information please visit [email or web address] Please follow instruction sheet, as given.  Your physician has requested that you have a carotid duplex. This test is an ultrasound of the carotid arteries in your neck. It looks at blood flow through these arteries that supply the brain with blood. Allow one hour for this exam. There are no restrictions or special instructions.  Follow-Up: Your physician recommends that you schedule a follow-up appointment in: 2 MONTHS OV   If you need a refill on your cardiac medications before your next appointment, please call your pharmacy.

## 2016-06-11 NOTE — Progress Notes (Signed)
Patient ID: [name] [name], female   DOB: 1950/07/27, 66 y.o.   MRN: [id number]     HPI: [name] [name] is a 66 y.o. female who presents to the office today for a 20 month cardiology follow-up evaluation.   [name] [name] has a history of CAD as well as PVD.  In September 2011 she suffered an ST segment elevation myocardial infarction and underwent intervention to a totally occluded proximal left circumflex coronary artery which was successfully stented with a DES stent.  There was diffuse disease in her circumflex beyond the site of total occlusion and involving multiple bifurcation branches for which she underwent PTCA of the smaller vessels.  She has significant PVD and underwent angioplasty and stenting with diamondback rotational atherectomy for right popliteal and SFA in January 2012 and required repeat intervention in 2013 by Dr. Gwenlyn Found.  She has documented carotid stenosis with total right internal carotid occlusion and small amount of fibrous plaque in the left.  An echo Doppler study showed an EF in the 40-45% range with akinesis of basal inferior posterior myocardium with grade 1 diastolic dysfunction and mitral valvular annular calcification.  She was seen by Cecilie Kicks in June 2015.  At that time she was having shortness of breath with exertion but no chest pain.  She has hyperlipidemia with cholesterols in the past.  There continues to be high despite lipid-lowering therapy with Lipitor 80 mg and Zetia was added to her medical regimen.  She has type 2 diabetes not well controlled.  She had chronic claudication symptoms but this has been fairly stable and not progressive.  She presents now for evaluation.  I have not seen her in almost 2 years.  She has significant peripheral vascular disease and has undergone intervention by Dr. Gwenlyn Found to her left lower extremity. In October 2017 she was hospitalized and on 11/02/2015 had a left femoral to below-knee popliteal artery bypass by Dr. Doren Custard.  She developed  an infection and required debridement and mood management was being followed by Dr. Sharol Given. She states she was hospitalized for total of 6 weeks and required rehabilitation for 8 weeks.  During that hospitalization, an echo Doppler study showed an EF of 55-5%.  There was grade 1 diastolic dysfunction.  There was mitral annular calcification.  Left atrium was mildly data.  She has experienced issues with leg swelling and has been on Lasix now for 2 weeks with improvement.  She experiences exertional dyspnea. She denies recent chest tightness.  She was recently seen by Dr. Deforest Hoyles of [place].  BNP was increased at 761 for which furosemide 40 mg was initiated.She presents for follow-up cardiology evaluation   Past Medical History:  Diagnosis Date  . Anemia 10/2015   Acute Blood Loss  . Arthritis    "feel like I have it all over" (08/28/2015)  . CHF (congestive heart failure) ([place])   . Complication of anesthesia    DIFFICULT WAKING "only when I was smoking; no problems since I quit"  . Coronary artery disease   . Family history of adverse reaction to anesthesia    sister slow to wake up  . GERD (gastroesophageal reflux disease)    takes Protonix daily   . Hip bursitis   . History of blood transfusion    10/2015  . Hyperlipidemia LDL goal < 70 06/28/2013   takes Atorvastatin daily  . Hypertension    takes Metoprolol and Imdur daily  . Malnutrition ([place])   . Migraine    "  none in a long time" (08/28/2015)  . Myocardial infarction ([place]) 2011  . Neuromuscular disorder ([place])    DIABETIC NEUROPATHY  . Osteomyelitis ([place]) 2017   Left foot  . PAD (peripheral artery disease) ([place])   . Peripheral vascular disease ([place])   . Respiratory failure ([place]) 10/2015   Acute Hypoxia- acute pulmonary edema 11/13/2015  . Septic shock ([place]) 10/2015  . Stroke ([place])   . Type II diabetes mellitus (HCC)    takes Lantus nightly.Average fasting blood sugar runs 80-90  Type II    Past Surgical History:  Procedure  Laterality Date  . ABDOMINAL HYSTERECTOMY    . APPENDECTOMY    . ATHERECTOMY N/A 06/04/2011   Procedure: ATHERECTOMY;  Surgeon: Lorretta Harp, MD;  Location[facility] CATH LAB;  Service: Cardiovascular;  Laterality: N/A;  . CARDIAC CATHETERIZATION  10/13/2009   95% stenosis in the AV groove circumflex and 95% ostial stenosis in small OM3. A 3x80m drug-eluting Promus stent inserted ito the circumflex. Dilatated with a 3.25x154mnoncompliant Quantum balloon within entire segment. The entire region was reduced to 0% and brisk TIMI3 flow.  . CAROTID DUPLEX  03/19/2011   Right ICA-demonstrates complete occlusion. Left ICA-demonstrates a small amount of fibrous plaque.  . Marland KitchenATARACT EXTRACTION W/ INTRAOCULAR LENS IMPLANT Right   . CESAREAN SECTION  1990  . CORONARY ANGIOPLASTY    . ENDARTERECTOMY FEMORAL Left 09/05/2015   Procedure: ENDARTERECTOMY FEMORAL WITH PROFUNDOPLASTY;  Surgeon: ChAngelia MouldMD;  [facility];  Service: Vascular;  Laterality: Left;  Left common femoral artery vein patch using left saphenous vien  . FEMORAL-POPLITEAL BYPASS GRAFT Left 11/02/2015   Procedure: BYPASS GRAFT FEMORAL-POPLITEAL ARTERY VS FEMORAL-TIBIAL ARTERY BYPASS;  Surgeon: ChAngelia MouldMD;  [facility]: V[facility];  Laterality: Left;  . I&D EXTREMITY Left 11/10/2015   Procedure: Debridement Left Foot Ulcer, Application  Wound VAC;  Surgeon: MaNewt MinionMD;  [facility]: [facility]ics;  Laterality: Left;  . ILIAC ARTERY STENT Left 08/28/2015   common  . INTRAOPERATIVE ARTERIOGRAM Left 09/05/2015   Procedure: INTRA OPERATIVE ARTERIOGRAM;  Surgeon: ChAngelia MouldMD;  [place] [facility]: Vascular;  Laterality: Left;  . INTRAOPERATIVE ARTERIOGRAM Left 11/02/2015   Procedure: INTRA OPERATIVE ARTERIOGRAM;  Surgeon: ChAngelia MouldMD;  [facility]: Vascular;  Laterality: Left;  . LEXISCAN MYOVIEW  10/25/2010   Moderate perfusion defect due to  infarct/scar with mild perinfarct ischemia seen in the Basal Inferolateral, Basal Anterolateral, Mid Inferolateral, and Mid Anterolateral regions. Post-stress EF is 50%.  . OVBagley  "ruptured"  . PERIPHERAL VASCULAR ANGIOGRAM  01/26/2010   High-grade SFA disease: left greater than right. Left SFA would require fem-pop bypass grafting. Right SFA could be stented but might require Diamondback Orbital atherectomy.  . Marland KitchenERIPHERAL VASCULAR ANGIOGRAM  02/23/2010   Stealth Predator orbital rotational atherectomy performed on SFA & Popliteal up to 90,000 RPM. Stenting using overlapping 5x10032mnd 5x60m33msolute Pro Nitinol self-expanding stents beginning just at the knee up to the mid SFA resulting in reduction of 90-95% calcified SFA & Popliteal stenosis to 0. Stenting performed on the distal common & proximal iliac artery with a 10x4 Absolute Pro- 70-0%.  . PEMarland KitchenIPHERAL VASCULAR ANGIOGRAM  06/17/2010   PTA performed to the right external iliac artery stent using a 5x100 balloon at 10 atmospheres. Stenting performed using a 6x18 Genesis on Opta balloon. Postdilatation with a 7x2 balloon resulting in a  95% "in-stent" stenosis to 0% residual.  . PERIPHERAL VASCULAR ANGIOGRAM  06/04/2011   Bilateral total SFAs not percutaneously addressable. Good canidate for femoropopliteal bypass grafting  . PERIPHERAL VASCULAR ANGIOGRAM  08/28/2015  . PERIPHERAL VASCULAR CATHETERIZATION N/A 08/28/2015   Procedure: Lower Extremity Angiography;  Surgeon: Lorretta Harp, MD;  Location: [place] CV LAB;  Service: Cardiovascular;  Laterality: N/A;  . PERIPHERAL VASCULAR CATHETERIZATION N/A 08/28/2015   Procedure: Abdominal Aortogram;  Surgeon: Lorretta Harp, MD;  Location: [place] CV LAB;  Service: Cardiovascular;  Laterality: N/A;  . PERIPHERAL VASCULAR CATHETERIZATION Left 08/28/2015   Procedure: Peripheral Vascular Intervention;  Surgeon: Lorretta Harp, MD;  Location: [place] CV LAB;  Service:  Cardiovascular;  Laterality: Left;  common iliac  . PERIPHERAL VASCULAR CATHETERIZATION Left 08/28/2015   Procedure: Peripheral Vascular Atherectomy;  Surgeon: Lorretta Harp, MD;  Location: [place] CV LAB;  Service: Cardiovascular;  Laterality: Left;  common iliac  . PERIPHERAL VASCULAR CATHETERIZATION N/A 09/28/2015   Procedure: Lower Extremity Angiography;  Surgeon: Lorretta Harp, MD;  Location: [place] CV LAB;  Service: Cardiovascular;  Laterality: N/A;  . PERIPHERAL VASCULAR CATHETERIZATION Left 09/28/2015   Procedure: Peripheral Vascular Intervention;  Surgeon: Lorretta Harp, MD;  Location: [place] CV LAB;  Service: Cardiovascular;  Laterality: Left CFA  PCI with 9 mm x 4 cm Abbott nitinol absolute Pro self-expanding stent     . SKIN SPLIT GRAFT Left 12/01/2015   Procedure: LEFT FOOT SKIN GRAFT AND VAC;  Surgeon: Newt Minion, MD;  Location: [place];  Service: Orthopedics;  Laterality: Left;  . TRANSTHORACIC ECHOCARDIOGRAM  10/17/2009   EF 45-50%, moderate hypokinesis of the entire inferolateral myocardium, mild concentric hypertrophy and mild regurg of the mitral valva.  Marland Kitchen VEIN HARVEST Left 11/02/2015   Procedure: LEFT GREATER SAPHENOUS VEIN HARVEST;  Surgeon: Angelia Mould, MD;  Location: [place];  Service: Vascular;  Laterality: Left;    Allergies  Allergen Reactions  . Hydrochlorothiazide Other (See Comments)    lethargic   . Latex Rash  . Penicillins Swelling and Rash    Pt states she has tolerated Keflex in the past without problems. States she may have tolerated Augmentin in the past but it caused GI upset. Has patient had a PCN reaction causing immediate rash, facial/tongue/throat swelling, SOB or lightheadedness with hypotension: Yes Has patient had a PCN reaction causing severe rash involving mucus membranes or skin necrosis: No Has patient had a PCN reaction that required hospitalization No Has patient had a PCN reaction occurring within the last 10 years:  No    Current Outpatient Prescriptions  Medication Sig Dispense Refill  . amLODipine (NORVASC) 5 MG tablet TAKE 1 TABLET(5 MG) BY MOUTH DAILY 30 tablet 3  . aspirin EC 81 MG tablet Take 81 mg by mouth daily.    Marland Kitchen atorvastatin (LIPITOR) 80 MG tablet Take 1 tablet (80 mg total) by mouth daily. 90 tablet 1  . clopidogrel (PLAVIX) 75 MG tablet Take 1 tablet (75 mg total) by mouth daily. Holding for vascular surgery, restart as soon as possible after leg surgery (after 8/8) 90 tablet 3  . furosemide (LASIX) 40 MG tablet     . gabapentin (NEURONTIN) 300 MG capsule Take 300 mg by mouth 3 (three) times daily.     . isosorbide mononitrate (IMDUR) 30 MG 24 hr tablet 30 mg. Take 1 tablet along by mouth with 60 mg to equal 90 mg daily.    Marland Kitchen  isosorbide mononitrate (IMDUR) 60 MG 24 hr tablet 60 mg. Take 1 tablet by mouth along with 30 mg to equal 90 mg daily    . LEVEMIR FLEXTOUCH 100 UNIT/ML Pen INJECT 30 UNITS INTO THE SKIN HS UTD. DISCONTINUE LANTUS  3  . losartan (COZAAR) 100 MG tablet TAKE 1 TABLET BY MOUTH EVERY DAY 30 tablet 1  . metFORMIN (GLUCOPHAGE) 1000 MG tablet Take 1,000 mg by mouth 2 (two) times daily with a meal.    . metoprolol succinate (TOPROL-XL) 50 MG 24 hr tablet TAKE 1 AND 1/2 TABLET BY MOUTH DAILY 45 tablet 5  . ONE TOUCH ULTRA TEST test strip USE TO TEST EVERY DAY AS DIRECTED  9  . pantoprazole (PROTONIX) 40 MG tablet Take 1 tablet (40 mg total) by mouth daily. HAVE PMD DO  FUTURE  REFILLS ./CY 30 tablet 3  . potassium chloride (K-DUR,KLOR-CON) 10 MEQ tablet      No current facility-administered medications for this visit.     Social History   Social History  . Marital status: Married    Spouse name: N/A  . Number of children: N/A  . Years of education: N/A   Occupational History  . retired    Social History Main Topics  . Smoking status: Former Smoker    Packs/day: 1.50    Years: 41.00    Types: Cigarettes    Quit date: 10/12/2009  . Smokeless tobacco: Never Used    . Alcohol use No  . Drug use: No  . Sexual activity: Not Currently    Birth control/ protection: Surgical   Other Topics Concern  . Not on file   Social History Narrative  . No narrative on file    Family History  Problem Relation Age of Onset  . Hypertension Mother   . Heart failure Mother   . Heart failure Father   . Stroke Father   . Diabetes Father     ROS General: Negative; No fevers, chills, or night sweats HEENT: Negative; No changes in vision or hearing, sinus congestion, difficulty swallowing Pulmonary: Negative; No cough, wheezing, shortness of breath, hemoptysis Cardiovascular: See HPI: Positive for claudication. GI: Negative; No nausea, vomiting, diarrhea, or abdominal pain GU: Negative; No dysuria, hematuria, or difficulty voiding Musculoskeletal: Negative; no myalgias, joint pain, or weakness Hematologic: Negative; no easy bruising, bleeding Endocrine: Positive for poorly controlled diabetes Neuro: Positive for peripheral neuropathy Skin: Negative; No rashes or skin lesions Psychiatric: Negative; No behavioral problems, depression Sleep: Negative; No snoring,  daytime sleepiness, hypersomnolence, bruxism, restless legs, hypnogognic hallucinations. Other comprehensive 14 point system review is negative   Physical Exam BP (!) 157/70   Pulse 83   Ht _0  (1.626 m)   Wt 146 lb (66.2 kg)   BMI 25.06 kg/m    Repeat blood pressure by me 160/74.  Pulse in the mid 80s.  Wt Readings from Last 3 Encounters:  06/11/16 146 lb (66.2 kg)  06/05/16 145 lb (65.8 kg)  04/16/16 145 lb (65.8 kg)   General: Alert, oriented, no distress.  Skin: normal turgor, no rashes, warm and dry HEENT: Normocephalic, atraumatic. Pupils equal round and reactive to light; sclera anicteric; extraocular muscles intact, No lid lag; Nose without nasal septal hypertrophy; Mouth/Parynx benign; Mallinpatti scale 3 Neck: No JVD, positive for carotid bruit; No evidence for delayed carotid  upstroke Lungs: clear to ausculatation and percussion bilaterally; no wheezing or rales, normal inspiratory and expiratory effort Chest wall: without tenderness to palpitation Heart: PMI not  displaced, RRR, s1 s2 normal, 2/6 systolic murmur,.  No S3 gallop No diastolic murmur, no rubs, gallops, thrills, or heaves Abdomen: soft, nontender; no hepatosplenomehaly, BS+; abdominal aorta nontender and not dilated by palpation. Back: no CVA tenderness Pulses: 2+ positive for bilateral femoral bruits Musculoskeletal:  No joint deformities. Extremities: Pulses 2+, no clubbing cyanosis or edema, Homan's sign negative  Neurologic: grossly nonfocal; Cranial nerves grossly wnl Psychologic: Normal mood and affect  ECG (independently read by me): Normal sinus rhythm at 83 bpm.  Nonspecific T changes.  QTc interval 481 ms. PR interval 164 ms.  September 2016 ECG (independently read by me): Normal sinus rhythm at 89 bpm.  Normal intervals.  LABS:  BMP Latest Ref Rng & Units 01/02/2016 12/22/2015 12/16/2015  Glucose 65 - 99 mg/dL 114(H) - -  BUN 7 - 25 mg/dL _0 Creatinine 0.50 - 0.99 mg/dL 0.73 0.6 0.7  Sodium 135 - 146 mmol/L 140 142 143  Potassium 3.5 - 5.3 mmol/L 5.2 4.2 3.5  Chloride 98 - 110 mmol/L 104 - -  CO2 20 - 31 mmol/L 22 - -  Calcium 8.6 - 10.4 mg/dL 8.9 - -     Hepatic Function Latest Ref Rng & Units 12/01/2015 11/07/2015 11/05/2015  Total Protein 6.5 - 8.1 g/dL 6.7 5.6(L) 5.0(L)  Albumin 3.5 - 5.0 g/dL 2.9(L) 1.5(L) 1.4(L)  AST 15 - 41 U/L _1 ALT 14 - 54 U/L 9(L) 15 12(L)  Alk Phosphatase 38 - 126 U/L 72 85 74  Total Bilirubin 0.3 - 1.2 mg/dL 0.5 0.6 0.4  Bilirubin, Direct 0.1 - 0.5 mg/dL - - -    CBC Latest Ref Rng & Units 01/02/2016 12/22/2015 12/16/2015  WBC 3.8 - 10.8 K/uL 9.4 7.3 6.5  Hemoglobin 11.7 - 15.5 g/dL 11.2(L) 12.0 11.8(A)  Hematocrit 35.0 - 45.0 % 34.0(L) 36 36  Platelets 140 - 400 K/uL 293 200 205   Lab Results  Component Value Date   MCV 85.6  01/02/2016   MCV 87.5 12/01/2015   MCV 86.5 11/13/2015    Lab Results  Component Value Date   TSH 1.05 08/24/2015    BNP No results found for: BNP  ProBNP    Component Value Date/Time   PROBNP 565.0 (H) 10/18/2009 0500     Lipid Panel     Component Value Date/Time   CHOL 91 11/05/2015 0458   TRIG 109 11/05/2015 0458   HDL 25 (L) 11/05/2015 0458   CHOLHDL 3.6 11/05/2015 0458   VLDL 22 11/05/2015 0458   LDLCALC 44 11/05/2015 0458     RADIOLOGY: Dg Chest 2 View  Result Date: 05/31/2016 CLINICAL DATA:  Shortness of breath and chest tightness EXAM: CHEST  2 VIEW COMPARISON:  December 01, 2015 FINDINGS: There is no edema or consolidation. Heart is mildly enlarged with pulmonary venous hypertension. No adenopathy. There is aortic atherosclerosis. No bone lesions. IMPRESSION: Pulmonary vascular congestion without edema or consolidation. There is aortic atherosclerosis. Electronically Signed   By: Lowella Grip III M.D.   On: 05/31/2016 10:14    IMPRESSION:  1. Coronary artery disease involving native coronary artery of native heart without angina pectoris   2. Hyperlipidemia, unspecified hyperlipidemia type   3. Encounter for long-term (current) use of medications   4. Dyspnea on exertion   5. Essential hypertension   6. Type 2 diabetes mellitus with complication, without long-term current use of insulin (HCC)     ASSESSMENT AND PLAN: [name] [name] is a  66 year old female who suffered an ST segment elevation MI in September 2011 secondary to total left circumflex occlusion.  She status post intervention with DES stenting proximally and PTCA of diffuse disease distally.  She has significant peripheral vascular disease and is status post prior intervention by Dr. Gwenlyn Found.  She required a left femoral to below knee popliteal artery bypass with vein and also he had infection requiring debridement.  Her ejection fraction is 50-55%.  She recently has had issues with heart failure,  most likely diastolic, which has responded to initiation of Lasix for the past 2 weeks.  Her blood pressure today is elevated at 160/74, on repeat by me and I have recommended further titration of metoprolol to 75 mg daily.  She is also on losartan 100 mg for hypertension and continues to take her furosemide 40 mg daily as well as amlodipine 5 mg.  She now is on hyperal and 60 statin with atorvastatin with her PVD, CAD, and target LDL less than 70.  She is on aspirin and Plavix and denies bleeding.  She is diabetic on metformin.  Her reflux is controlled with Protonix.  She has peripheral neuropathy on gabapentin. A recent chest x-ray when her BNP was elevated showed prominent vascular congestion and evidence for aortic atherosclerosis.  Time spent: 30 minutes  Troy Sine, MD, [facility]  06/13/2016 8:40 PM

## 2016-06-12 ENCOUNTER — Telehealth: Payer: Self-pay | Admitting: Cardiovascular Disease

## 2016-06-12 NOTE — Telephone Encounter (Signed)
Called the patient and left a message to call back to schedule carotid and stress test and followup with Dr. Tresa Endo.

## 2016-06-12 NOTE — Telephone Encounter (Signed)
error 

## 2016-06-17 ENCOUNTER — Other Ambulatory Visit: Payer: Self-pay | Admitting: Cardiovascular Disease

## 2016-06-17 NOTE — Telephone Encounter (Signed)
Rx request sent to pharmacy.  

## 2016-06-19 ENCOUNTER — Telehealth (HOSPITAL_COMMUNITY): Payer: Self-pay | Admitting: Cardiovascular Disease

## 2016-06-25 NOTE — Telephone Encounter (Signed)
I called pt and spoke with her about scheduling the [facility] and she voiced that she is not going to have the test done.   06/19/2016 09:50 AM Phone (Outgoing) Jenine, [place] (Self) [phone] (H)   No Answer/Busy - Called pt and was not able to lmsg due to mailbox being full.     By Trina Ao A    06/21/2016 02:51 PM Phone ([address] Aleeta, [place] (Self) [phone] (H)   No Answer/Busy - Called pt to get her scheduled for [facility] and her VM is full    By Elita Boone

## 2016-07-03 ENCOUNTER — Ambulatory Visit (INDEPENDENT_AMBULATORY_CARE_PROVIDER_SITE_OTHER): Payer: Medicare Other | Admitting: Family

## 2016-07-03 ENCOUNTER — Encounter (INDEPENDENT_AMBULATORY_CARE_PROVIDER_SITE_OTHER): Payer: Self-pay | Admitting: Family

## 2016-07-03 VITALS — Ht 64.0 in | Wt 146.0 lb

## 2016-07-03 DIAGNOSIS — I2510 Atherosclerotic heart disease of native coronary artery without angina pectoris: Secondary | ICD-10-CM | POA: Diagnosis not present

## 2016-07-03 DIAGNOSIS — Z9889 Other specified postprocedural states: Secondary | ICD-10-CM

## 2016-07-03 DIAGNOSIS — Z945 Skin transplant status: Secondary | ICD-10-CM

## 2016-07-03 DIAGNOSIS — L97421 Non-pressure chronic ulcer of left heel and midfoot limited to breakdown of skin: Secondary | ICD-10-CM | POA: Diagnosis not present

## 2016-07-03 DIAGNOSIS — L03032 Cellulitis of left toe: Secondary | ICD-10-CM | POA: Diagnosis not present

## 2016-07-03 NOTE — Progress Notes (Signed)
Office Visit Note   Patient: [name] [name] Age           Date of Birth: 12-Jan-1951           MRN: [id number] Visit Date: 07/03/2016              Requested by: Georgann Housekeeper, MD [address] [place], [place] [address] PCP: Georgann Housekeeper, MD  Chief Complaint  Patient presents with  . Left Foot - Follow-up    12/10/16 left foot skin graft    [id number] denies any pain or problems or concerns at this time she denies any fever or chills she is pleased with her progress use been wearing a compression sock 24 hours a day 7 days a week status post skin graft on 12/01/2015. Is concerned about some recent bleeding she has had.  The patient is 66 year old woman who presents today in follow-up for ulceration to her left foot plantar aspect. Did have skin grafting in November of last year. She's been nonweightbearing wearing the silver sock daily. Complains of dry flaking skin over her lower extremity and foot. She continues to be pleased with the wound healing. Denies swelling overall states she feels well and has no concerns today.    Assessment & Plan: Visit Diagnoses:  1. Midfoot ulcer, left, limited to breakdown of skin (HCC)   2. H/O skin graft   3. Paronychia of great toe of left foot     Plan: We'll have her continue wearing the medical compression socks she will change this daily. Wear around-the-clock. Continue to minimize weight bearing until healed.   Follow-Up Instructions: No Follow-up on file.   Ortho Exam On examination patient is alert oriented no adenopathy well-dressed normal affect normal respiratory effort she is an bleeding in a wheelchair. There is mild pitting edema up to the tibial tubercle worse in the left than the right. Patient has a palpable pulse the plantar aspect of the left leg shows excellent healing there is no redness no synovitis no drainage no signs of infection there is 100% beefy granulation tissue the wound is 35 mm x 9 mm and 0.1 mm  deep. Offered a nail trim. Does have a paronychial infection to lateral left great toe nail laterally. Purulence and nonviable tissue easily debride with gauze. Bleeding tissue in wound bed. No surrounding erythema or cellulitis.   ROS:  All other review of systems negative except as mentioned in history of present illness. Imaging: No results found.  Labs: Lab Results  Component Value Date   HGBA1C 7.6 (H) 11/02/2015   HGBA1C 8.3 (H) 09/01/2015   HGBA1C 13.2 (H) 03/03/2013   ESRSEDRATE 44 (H) 01/02/2016   ESRSEDRATE 135 (H) 11/02/2015   CRP 5.1 01/02/2016   CRP 3.7 (H) 11/02/2015   REPTSTATUS 11/15/2015 FINAL 11/10/2015   REPTSTATUS 11/15/2015 FINAL 11/10/2015   GRAMSTAIN  11/10/2015    ABUNDANT WBC PRESENT, PREDOMINANTLY PMN NO ORGANISMS SEEN    GRAMSTAIN  11/10/2015    ABUNDANT WBC PRESENT, PREDOMINANTLY PMN RARE GRAM POSITIVE COCCI IN PAIRS    CULT  11/10/2015    FEW GROUP B STREP(S.AGALACTIAE)ISOLATED TESTING AGAINST S. AGALACTIAE NOT ROUTINELY PERFORMED DUE TO PREDICTABILITY OF AMP/PEN/VAN SUSCEPTIBILITY. NO ANAEROBES ISOLATED    CULT  11/10/2015    RARE GROUP B STREP(S.AGALACTIAE)ISOLATED TESTING AGAINST S. AGALACTIAE NOT ROUTINELY PERFORMED DUE TO PREDICTABILITY OF AMP/PEN/VAN SUSCEPTIBILITY. NO ANAEROBES ISOLATED     Orders:  No orders of the defined types were placed in this  encounter.  No orders of the defined types were placed in this encounter.    Procedures: No procedures performed  Clinical Data: No additional findings.  Subjective: Review of Systems  Constitutional: Negative for chills and fever.  Skin: Positive for wound. Negative for color change.    Objective: Vital Signs: Ht 5\' 4"  (1.626 m)   Wt 146 lb (66.2 kg)   BMI 25.06 kg/m   Specialty Comments:  No specialty comments available.  PMFS History: Patient Active Problem List   Diagnosis Date Noted  . Midfoot ulcer, left, limited to breakdown of skin (HCC) 01/08/2016  . Thigh  hematoma 12/22/2015  . History of blood transfusion 12/22/2015  . Acute on chronic systolic CHF (congestive heart failure) (HCC) 12/22/2015  . H/O skin graft 12/11/2015  . NSTEMI (non-ST elevated myocardial infarction) (HCC) 11/19/2015  . DM type 2 with diabetic peripheral neuropathy (HCC) 11/19/2015  . Traumatic hemorrhagic shock (HCC)   . Chronic systolic CHF (congestive heart failure) (HCC)   . Uncontrolled type 2 diabetes mellitus with complication (HCC)   . Demand myocardial infarction (HCC) 11/08/2015  . Surgery, elective   . Central line infection, initial encounter   . Septic shock (HCC) 11/03/2015  . Encephalopathy acute 11/03/2015  . Acute blood loss anemia 11/03/2015  . Acute respiratory failure with hypoxia (HCC)   . CVA (cerebral vascular accident) (HCC)   . S/P femoral-popliteal bypass surgery   . Malnutrition of moderate degree 11/01/2015  . Diabetic ulcer of left midfoot associated with diabetes mellitus due to underlying condition, with muscle involvement without evidence of necrosis (HCC)   . Hypertensive heart disease with chronic diastolic congestive heart failure (HCC) 10/31/2015  . Osteomyelitis of left foot (HCC) 10/30/2015  . PVD (peripheral vascular disease) with claudication (HCC) 09/28/2015  . Critical lower limb ischemia 08/28/2015  . Hyperlipidemia with target LDL less than 70 06/28/2013  . Diabetes mellitus type II, uncontrolled (HCC) 03/03/2013  . Complicated migraine 03/03/2013  . Stroke-like symptom 03/02/2013  . Diabetes mellitus (HCC) 03/02/2013  . Aphasia 03/02/2013  . Headache(784.0) 03/02/2013  . PAD (peripheral artery disease) (HCC) 06/05/2011  . Coronary artery disease involving native coronary artery of native heart without angina pectoris 06/05/2011  . Hypotension 06/05/2011  . Claudication in peripheral vascular disease:  Lifestyle limiting. 06/05/2011  . Hx of tobacco use, presenting hazards to health 06/05/2011   Past Medical History:    Diagnosis Date  . Anemia 10/2015   Acute Blood Loss  . Arthritis    "feel like I have it all over" (08/28/2015)  . CHF (congestive heart failure) (HCC)   . Complication of anesthesia    DIFFICULT WAKING "only when I was smoking; no problems since I quit"  . Coronary artery disease   . Family history of adverse reaction to anesthesia    sister slow to wake up  . GERD (gastroesophageal reflux disease)    takes Protonix daily   . Hip bursitis   . History of blood transfusion    10/2015  . Hyperlipidemia LDL goal < 70 06/28/2013   takes Atorvastatin daily  . Hypertension    takes Metoprolol and Imdur daily  . Malnutrition (HCC)   . Migraine    "none in a long time" (08/28/2015)  . Myocardial infarction (HCC) 2011  . Neuromuscular disorder (HCC)    DIABETIC NEUROPATHY  . Osteomyelitis (HCC) 2017   Left foot  . PAD (peripheral artery disease) (HCC)   . Peripheral vascular disease (HCC)   .  Respiratory failure (HCC) 10/2015   Acute Hypoxia- acute pulmonary edema 11/13/2015  . Septic shock (HCC) 10/2015  . Stroke (HCC)   . Type II diabetes mellitus (HCC)    takes Lantus nightly.Average fasting blood sugar runs 80-90  Type II    Family History  Problem Relation Age of Onset  . Hypertension Mother   . Heart failure Mother   . Heart failure Father   . Stroke Father   . Diabetes Father     Past Surgical History:  Procedure Laterality Date  . ABDOMINAL HYSTERECTOMY    . APPENDECTOMY    . ATHERECTOMY N/A 06/04/2011   Procedure: ATHERECTOMY;  Surgeon: Runell Gess, MD;  Location: [facility] CATH LAB;  Service: Cardiovascular;  Laterality: N/A;  . CARDIAC CATHETERIZATION  10/13/2009   95% stenosis in the AV groove circumflex and 95% ostial stenosis in small OM3. A 3x61mm drug-eluting Promus stent inserted ito the circumflex. Dilatated with a 3.25x63mm noncompliant Quantum balloon within entire segment. The entire region was reduced to 0% and brisk TIMI3 flow.  . CAROTID DUPLEX   03/19/2011   Right ICA-demonstrates complete occlusion. Left ICA-demonstrates a small amount of fibrous plaque.  Marland Kitchen CATARACT EXTRACTION W/ INTRAOCULAR LENS IMPLANT Right   . CESAREAN SECTION  1990  . CORONARY ANGIOPLASTY    . ENDARTERECTOMY FEMORAL Left 09/05/2015   Procedure: ENDARTERECTOMY FEMORAL WITH PROFUNDOPLASTY;  Surgeon: Chuck Hint, MD;  Location: [facility] OR;  Service: Vascular;  Laterality: Left;  Left common femoral artery vein patch using left saphenous vien  . FEMORAL-POPLITEAL BYPASS GRAFT Left 11/02/2015   Procedure: BYPASS GRAFT FEMORAL-POPLITEAL ARTERY VS FEMORAL-TIBIAL ARTERY BYPASS;  Surgeon: Chuck Hint, MD;  Location: [facility] OR;  Service: Vascular;  Laterality: Left;  . I&D EXTREMITY Left 11/10/2015   Procedure: Debridement Left Foot Ulcer, Application  Wound VAC;  Surgeon: [name], MD;  Location: [facility] OR;  Service: Orthopedics;  Laterality: Left;  . ILIAC ARTERY STENT Left 08/28/2015   common  . INTRAOPERATIVE ARTERIOGRAM Left 09/05/2015   Procedure: INTRA OPERATIVE ARTERIOGRAM;  Surgeon: Chuck Hint, MD;  Location[facility] OR;  Service: Vascular;  Laterality: Left;  . INTRAOPERATIVE ARTERIOGRAM Left 11/02/2015   Procedure: INTRA OPERATIVE ARTERIOGRAM;  Surgeon: Chuck Hint, MD;  Location: [facility] OR;  Service: Vascular;  Laterality: Left;  . LEXISCAN MYOVIEW  10/25/2010   Moderate perfusion defect due to infarct/scar with mild perinfarct ischemia seen in the Basal Inferolateral, Basal Anterolateral, Mid Inferolateral, and Mid Anterolateral regions. Post-stress EF is 50%.  . OVARY SURGERY  1983?   "ruptured"  . PERIPHERAL VASCULAR ANGIOGRAM  01/26/2010   High-grade SFA disease: left greater than right. Left SFA would require fem-pop bypass grafting. Right SFA could be stented but might require Diamondback Orbital atherectomy.  Marland Kitchen PERIPHERAL VASCULAR ANGIOGRAM  02/23/2010   Stealth Predator orbital rotational atherectomy performed on SFA & Popliteal up to  90,000 RPM. Stenting using overlapping 5x11mm and 5x19mm Absolute Pro Nitinol self-expanding stents beginning just at the knee up to the mid SFA resulting in reduction of 90-95% calcified SFA & Popliteal stenosis to 0. Stenting performed on the distal common & proximal iliac artery with a 10x4 Absolute Pro- 70-0%.  Marland Kitchen PERIPHERAL VASCULAR ANGIOGRAM  06/17/2010   PTA performed to the right external iliac artery stent using a 5x100 balloon at 10 atmospheres. Stenting performed using a 6x18 Genesis on Opta balloon. Postdilatation with a 7x2 balloon resulting in a 95% "in-stent" stenosis to 0% residual.  . PERIPHERAL VASCULAR  ANGIOGRAM  06/04/2011   Bilateral total SFAs not percutaneously addressable. Good canidate for femoropopliteal bypass grafting  . PERIPHERAL VASCULAR ANGIOGRAM  08/28/2015  . PERIPHERAL VASCULAR CATHETERIZATION N/A 08/28/2015   Procedure: Lower Extremity Angiography;  Surgeon: Runell Gess, MD;  Location: [facility] INVASIVE CV LAB;  Service: Cardiovascular;  Laterality: N/A;  . PERIPHERAL VASCULAR CATHETERIZATION N/A 08/28/2015   Procedure: Abdominal Aortogram;  Surgeon: Runell Gess, MD;  Location: [facility] INVASIVE CV LAB;  Service: Cardiovascular;  Laterality: N/A;  . PERIPHERAL VASCULAR CATHETERIZATION Left 08/28/2015   Procedure: Peripheral Vascular Intervention;  Surgeon: Runell Gess, MD;  Location: [facility] INVASIVE CV LAB;  Service: Cardiovascular;  Laterality: Left;  common iliac  . PERIPHERAL VASCULAR CATHETERIZATION Left 08/28/2015   Procedure: Peripheral Vascular Atherectomy;  Surgeon: Runell Gess, MD;  Location: [facility] INVASIVE CV LAB;  Service: Cardiovascular;  Laterality: Left;  common iliac  . PERIPHERAL VASCULAR CATHETERIZATION N/A 09/28/2015   Procedure: Lower Extremity Angiography;  Surgeon: Runell Gess, MD;  Location: [facility] INVASIVE CV LAB;  Service: Cardiovascular;  Laterality: N/A;  . PERIPHERAL VASCULAR CATHETERIZATION Left 09/28/2015   Procedure: Peripheral Vascular  Intervention;  Surgeon: Runell Gess, MD;  Location: [facility] INVASIVE CV LAB;  Service: Cardiovascular;  Laterality: Left CFA  PCI with 9 mm x 4 cm Abbott nitinol absolute Pro self-expanding stent     . SKIN SPLIT GRAFT Left 12/01/2015   Procedure: LEFT FOOT SKIN GRAFT AND VAC;  Surgeon: [name], MD;  Location: [facility] OR;  Service: Orthopedics;  Laterality: Left;  . TRANSTHORACIC ECHOCARDIOGRAM  10/17/2009   EF 45-50%, moderate hypokinesis of the entire inferolateral myocardium, mild concentric hypertrophy and mild regurg of the mitral valva.  Marland Kitchen VEIN HARVEST Left 11/02/2015   Procedure: LEFT GREATER SAPHENOUS VEIN HARVEST;  Surgeon: Chuck Hint, MD;  Location: [facility] OR;  Service: Vascular;  Laterality: Left;   Social History   Occupational History  . retired    Social History Main Topics  . Smoking status: Former Smoker    Packs/day: 1.50    Years: 41.00    Types: Cigarettes    Quit date: 10/12/2009  . Smokeless tobacco: Never Used  . Alcohol use No  . Drug use: No  . Sexual activity: Not Currently    Birth control/ protection: Surgical

## 2016-07-04 ENCOUNTER — Other Ambulatory Visit (INDEPENDENT_AMBULATORY_CARE_PROVIDER_SITE_OTHER): Payer: Self-pay | Admitting: Family

## 2016-07-04 ENCOUNTER — Telehealth (INDEPENDENT_AMBULATORY_CARE_PROVIDER_SITE_OTHER): Payer: Self-pay

## 2016-07-04 MED ORDER — DOXYCYCLINE HYCLATE 100 MG PO TABS: 100.0000 mg | ORAL_TABLET | Freq: Two times a day (BID) | ORAL | 0 refills | Status: DC

## 2016-07-04 NOTE — Telephone Encounter (Signed)
Patient called stating that a Rx for an antibiotic was supposed to be called into her pharmacy.  CB# is [phone].

## 2016-07-04 NOTE — Telephone Encounter (Signed)
I called and spoke with patient to advise that rx for doxycycline was sent into [facility] on [address].

## 2016-07-05 ENCOUNTER — Ambulatory Visit (HOSPITAL_COMMUNITY)
Admission: RE | Admit: 2016-07-05 | Payer: Medicare Other | Source: Ambulatory Visit | Attending: Cardiovascular Disease | Admitting: Cardiovascular Disease

## 2016-07-09 ENCOUNTER — Other Ambulatory Visit (INDEPENDENT_AMBULATORY_CARE_PROVIDER_SITE_OTHER): Payer: Self-pay | Admitting: Orthopedic Surgery

## 2016-07-09 ENCOUNTER — Telehealth (INDEPENDENT_AMBULATORY_CARE_PROVIDER_SITE_OTHER): Payer: Self-pay | Admitting: Family

## 2016-07-09 MED ORDER — SULFAMETHOXAZOLE-TRIMETHOPRIM 800-160 MG PO TABS: 1.0000 | ORAL_TABLET | Freq: Two times a day (BID) | ORAL | 0 refills | Status: DC

## 2016-07-09 NOTE — Telephone Encounter (Signed)
Prescription sent for Bactrim DS 

## 2016-07-09 NOTE — Telephone Encounter (Signed)
Patient states the Doxycycline is making her sick & request something different

## 2016-07-10 ENCOUNTER — Telehealth (HOSPITAL_COMMUNITY): Payer: Self-pay | Admitting: Cardiovascular Disease

## 2016-07-10 NOTE — Telephone Encounter (Signed)
Called pt on 06/19/16 but VM was full and was not able to leave message.  Called patient today 07/10/16 and spoke with patient and she voiced that she decided to not have the test done.

## 2016-07-10 NOTE — Telephone Encounter (Signed)
I called patient yesterday evening to advise rx for bactrim sent to her pharmacy.

## 2016-07-16 ENCOUNTER — Other Ambulatory Visit: Payer: Self-pay | Admitting: Cardiovascular Disease

## 2016-07-16 DIAGNOSIS — I6521 Occlusion and stenosis of right carotid artery: Secondary | ICD-10-CM

## 2016-07-17 ENCOUNTER — Ambulatory Visit (HOSPITAL_COMMUNITY)
Admission: RE | Admit: 2016-07-17 | Discharge: 2016-07-17 | Disposition: A | Discharge status: Home / Self Care | Payer: Medicare Other | Source: Ambulatory Visit | Attending: Cardiovascular Disease | Admitting: Cardiovascular Disease

## 2016-07-17 DIAGNOSIS — I6521 Occlusion and stenosis of right carotid artery: Secondary | ICD-10-CM | POA: Insufficient documentation

## 2016-07-29 ENCOUNTER — Telehealth: Payer: Self-pay | Admitting: *Deleted

## 2016-07-29 NOTE — Telephone Encounter (Signed)
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 Heterogeneous plaque in the LICA. Stable, 1-39% LICA stenosis. >50% LECA stenosis. Patent vertebral arteries with antegrade flow. Elevated right subclavian artery velocities. Normal left subclavian artery. No significant change from prior study.  Follow-up in one year

## 2016-07-29 NOTE — Telephone Encounter (Signed)
Patient notified of carotid results and recommendations.

## 2016-08-01 ENCOUNTER — Ambulatory Visit (INDEPENDENT_AMBULATORY_CARE_PROVIDER_SITE_OTHER): Payer: Medicare Other | Admitting: Family

## 2016-08-01 DIAGNOSIS — I739 Peripheral vascular disease, unspecified: Secondary | ICD-10-CM | POA: Diagnosis not present

## 2016-08-01 DIAGNOSIS — L03031 Cellulitis of right toe: Secondary | ICD-10-CM

## 2016-08-01 DIAGNOSIS — Z9889 Other specified postprocedural states: Secondary | ICD-10-CM

## 2016-08-01 DIAGNOSIS — B351 Tinea unguium: Secondary | ICD-10-CM | POA: Diagnosis not present

## 2016-08-01 DIAGNOSIS — I6521 Occlusion and stenosis of right carotid artery: Secondary | ICD-10-CM

## 2016-08-01 DIAGNOSIS — L97425 Non-pressure chronic ulcer of left heel and midfoot with muscle involvement without evidence of necrosis: Secondary | ICD-10-CM

## 2016-08-01 DIAGNOSIS — Z945 Skin transplant status: Secondary | ICD-10-CM

## 2016-08-01 DIAGNOSIS — E08621 Diabetes mellitus due to underlying condition with foot ulcer: Secondary | ICD-10-CM | POA: Diagnosis not present

## 2016-08-01 MED ORDER — SULFAMETHOXAZOLE-TRIMETHOPRIM 800-160 MG PO TABS: 1.0000 | ORAL_TABLET | Freq: Two times a day (BID) | ORAL | 0 refills | Status: DC

## 2016-08-01 NOTE — Progress Notes (Signed)
Office Visit Note   Patient: [name] [name]           Date of Birth: April 10, 1950           MRN: [id number] Visit Date: 08/01/2016              Requested by: Georgann Housekeeper, MD [address] [place], [place] [address] PCP: Georgann Housekeeper, MD  No chief complaint on file.   [id number] denies any pain or problems or concerns at this time she denies any fever or chills she is pleased with her progress use been wearing a compression sock 24 hours a day 7 days a week status post skin graft on 12/01/2015. Is concerned about some recent bleeding she has had.  The patient is 66 year old woman who presents today in follow-up for ulceration to her left foot plantar aspect. Did have skin grafting in November of last year. She's been nonweightbearing wearing the silver sock daily. Complains of dry flaking skin over her lower extremity and foot. She continues to be pleased with the wound healing. Denies swelling overall states she feels well and has no concerns today.  Also complaining of right foot great toe and second toe pain. Some drainage from second toe.       Assessment & Plan: Visit Diagnoses:  1. Onychomycosis   2. Paronychia of second toe of right foot   3. PVD (peripheral vascular disease) with claudication (HCC)   4. Diabetic ulcer of left midfoot associated with diabetes mellitus due to underlying condition, with muscle involvement without evidence of necrosis (HCC)   5. H/O skin graft     Plan: We'll have her continue wearing the medical compression socks she will change this daily. Wear around-the-clock. Continue to minimize weight bearing until healed.   Will call in Bactrim x 4 weeks. Return in 2 weeks to reevaluated right foot second toe.   Follow-Up Instructions: Return in about 4 weeks (around 08/29/2016).   Ortho Exam On examination patient is alert oriented no adenopathy well-dressed normal affect normal respiratory effort she is an bleeding in a wheelchair.  There is mild pitting edema up to the tibial tubercle worse in the left than the right. Patient has a palpable pulse the plantar aspect of the left leg shows excellent healing there is no redness no synovitis no drainage no signs of infection there is 100% beefy granulation tissue the wound is 30 mm x 7 mm and 0.1 mm deep. Left great toe nail intact. Is mildly elevated and thickened and discolored. No drainage, erythema, sign of infection.  Does have a paronychial infection to medial aspect, right second nail plate. Purulence expressed. Some dried blood. Nail plate intact. Toe tender. Right great toe is elevated moderately, no drainage. No erythema, no sign of infection or cellulitis.   ROS:  All other review of systems negative except as mentioned in history of present illness. Imaging: No results found.  Labs: Lab Results  Component Value Date   HGBA1C 7.6 (H) 11/02/2015   HGBA1C 8.3 (H) 09/01/2015   HGBA1C 13.2 (H) 03/03/2013   ESRSEDRATE 44 (H) 01/02/2016   ESRSEDRATE 135 (H) 11/02/2015   CRP 5.1 01/02/2016   CRP 3.7 (H) 11/02/2015   REPTSTATUS 11/15/2015 FINAL 11/10/2015   REPTSTATUS 11/15/2015 FINAL 11/10/2015   GRAMSTAIN  11/10/2015    ABUNDANT WBC PRESENT, PREDOMINANTLY PMN NO ORGANISMS SEEN    GRAMSTAIN  11/10/2015    ABUNDANT WBC PRESENT, PREDOMINANTLY PMN RARE GRAM POSITIVE COCCI IN  PAIRS    CULT  11/10/2015    FEW GROUP B STREP(S.AGALACTIAE)ISOLATED TESTING AGAINST S. AGALACTIAE NOT ROUTINELY PERFORMED DUE TO PREDICTABILITY OF AMP/PEN/VAN SUSCEPTIBILITY. NO ANAEROBES ISOLATED    CULT  11/10/2015    RARE GROUP B STREP(S.AGALACTIAE)ISOLATED TESTING AGAINST S. AGALACTIAE NOT ROUTINELY PERFORMED DUE TO PREDICTABILITY OF AMP/PEN/VAN SUSCEPTIBILITY. NO ANAEROBES ISOLATED     Orders:  No orders of the defined types were placed in this encounter.  No orders of the defined types were placed in this encounter.    Procedures: No procedures performed  Clinical  Data: No additional findings.  Subjective: Review of Systems  Constitutional: Negative for chills and fever.  Skin: Positive for wound. Negative for color change.    Objective: Vital Signs: There were no vitals taken for this visit.  Specialty Comments:  No specialty comments available.  PMFS History: Patient Active Problem List   Diagnosis Date Noted  . Midfoot ulcer, left, limited to breakdown of skin (HCC) 01/08/2016  . Thigh hematoma 12/22/2015  . History of blood transfusion 12/22/2015  . Acute on chronic systolic CHF (congestive heart failure) (HCC) 12/22/2015  . H/O skin graft 12/11/2015  . NSTEMI (non-ST elevated myocardial infarction) (HCC) 11/19/2015  . DM type 2 with diabetic peripheral neuropathy (HCC) 11/19/2015  . Traumatic hemorrhagic shock (HCC)   . Chronic systolic CHF (congestive heart failure) (HCC)   . Uncontrolled type 2 diabetes mellitus with complication (HCC)   . Demand myocardial infarction (HCC) 11/08/2015  . Surgery, elective   . Central line infection, initial encounter   . Septic shock (HCC) 11/03/2015  . Encephalopathy acute 11/03/2015  . Acute blood loss anemia 11/03/2015  . Acute respiratory failure with hypoxia (HCC)   . CVA (cerebral vascular accident) (HCC)   . S/P femoral-popliteal bypass surgery   . Malnutrition of moderate degree 11/01/2015  . Diabetic ulcer of left midfoot associated with diabetes mellitus due to underlying condition, with muscle involvement without evidence of necrosis (HCC)   . Hypertensive heart disease with chronic diastolic congestive heart failure (HCC) 10/31/2015  . Osteomyelitis of left foot (HCC) 10/30/2015  . PVD (peripheral vascular disease) with claudication (HCC) 09/28/2015  . Critical lower limb ischemia 08/28/2015  . Hyperlipidemia with target LDL less than 70 06/28/2013  . Diabetes mellitus type II, uncontrolled (HCC) 03/03/2013  . Complicated migraine 03/03/2013  . Stroke-like symptom 03/02/2013  .  Diabetes mellitus (HCC) 03/02/2013  . Aphasia 03/02/2013  . Headache(784.0) 03/02/2013  . PAD (peripheral artery disease) (HCC) 06/05/2011  . Coronary artery disease involving native coronary artery of native heart without angina pectoris 06/05/2011  . Hypotension 06/05/2011  . Claudication in peripheral vascular disease:  Lifestyle limiting. 06/05/2011  . Hx of tobacco use, presenting hazards to health 06/05/2011   Past Medical History:  Diagnosis Date  . Anemia 10/2015   Acute Blood Loss  . Arthritis    "feel like I have it all over" (08/28/2015)  . CHF (congestive heart failure) (HCC)   . Complication of anesthesia    DIFFICULT WAKING "only when I was smoking; no problems since I quit"  . Coronary artery disease   . Family history of adverse reaction to anesthesia    sister slow to wake up  . GERD (gastroesophageal reflux disease)    takes Protonix daily   . Hip bursitis   . History of blood transfusion    10/2015  . Hyperlipidemia LDL goal < 70 06/28/2013   takes Atorvastatin daily  . Hypertension  takes Metoprolol and Imdur daily  . Malnutrition (HCC)   . Migraine    "none in a long time" (08/28/2015)  . Myocardial infarction (HCC) 2011  . Neuromuscular disorder (HCC)    DIABETIC NEUROPATHY  . Osteomyelitis (HCC) 2017   Left foot  . PAD (peripheral artery disease) (HCC)   . Peripheral vascular disease (HCC)   . Respiratory failure (HCC) 10/2015   Acute Hypoxia- acute pulmonary edema 11/13/2015  . Septic shock (HCC) 10/2015  . Stroke (HCC)   . Type II diabetes mellitus (HCC)    takes Lantus nightly.Average fasting blood sugar runs 80-90  Type II    Family History  Problem Relation [name] of Onset  . Hypertension Mother   . Heart failure Mother   . Heart failure Father   . Stroke Father   . Diabetes Father     Past Surgical History:  Procedure Laterality Date  . ABDOMINAL HYSTERECTOMY    . APPENDECTOMY    . ATHERECTOMY N/A 06/04/2011   Procedure: ATHERECTOMY;   Surgeon: Runell Gess, MD;  Location: [facility] CATH LAB;  Service: Cardiovascular;  Laterality: N/A;  . CARDIAC CATHETERIZATION  10/13/2009   95% stenosis in the AV groove circumflex and 95% ostial stenosis in small OM3. A 3x55mm drug-eluting Promus stent inserted ito the circumflex. Dilatated with a 3.25x64mm noncompliant Quantum balloon within entire segment. The entire region was reduced to 0% and brisk TIMI3 flow.  . CAROTID DUPLEX  03/19/2011   Right ICA-demonstrates complete occlusion. Left ICA-demonstrates a small amount of fibrous plaque.  Marland Kitchen CATARACT EXTRACTION W/ INTRAOCULAR LENS IMPLANT Right   . CESAREAN SECTION  1990  . CORONARY ANGIOPLASTY    . ENDARTERECTOMY FEMORAL Left 09/05/2015   Procedure: ENDARTERECTOMY FEMORAL WITH PROFUNDOPLASTY;  Surgeon: Chuck Hint, MD;  [facility];  Service: Vascular;  Laterality: Left;  Left common femoral artery vein patch using left saphenous vien  . FEMORAL-POPLITEAL BYPASS GRAFT Left 11/02/2015   Procedure: BYPASS GRAFT FEMORAL-POPLITEAL ARTERY VS FEMORAL-TIBIAL ARTERY BYPASS;  Surgeon: Chuck Hint, MD;  Location: [facility] OR;  Service: Vascular;  Laterality: Left;  . I&D EXTREMITY Left 11/10/2015   Procedure: Debridement Left Foot Ulcer, Application  Wound VAC;  Surgeon: [name], MD;  Location: [facility] OR;  Service: Orthopedics;  Laterality: Left;  . ILIAC ARTERY STENT Left 08/28/2015   common  . INTRAOPERATIVE ARTERIOGRAM Left 09/05/2015   Procedure: INTRA OPERATIVE ARTERIOGRAM;  Surgeon: Chuck Hint, MD;  Location: [facility] OR;  Service: Vascular;  Laterality: Left;  . INTRAOPERATIVE ARTERIOGRAM Left 11/02/2015   Procedure: INTRA OPERATIVE ARTERIOGRAM;  Surgeon: Chuck Hint, MD;  Location: [facility] OR;  Service: Vascular;  Laterality: Left;  . LEXISCAN MYOVIEW  10/25/2010   Moderate perfusion defect due to infarct/scar with mild perinfarct ischemia seen in the Basal Inferolateral, Basal Anterolateral, Mid Inferolateral, and Mid  Anterolateral regions. Post-stress EF is 50%.  . OVARY SURGERY  1983?   "ruptured"  . PERIPHERAL VASCULAR ANGIOGRAM  01/26/2010   High-grade SFA disease: left greater than right. Left SFA would require fem-pop bypass grafting. Right SFA could be stented but might require Diamondback Orbital atherectomy.  Marland Kitchen PERIPHERAL VASCULAR ANGIOGRAM  02/23/2010   Stealth Predator orbital rotational atherectomy performed on SFA & Popliteal up to 90,000 RPM. Stenting using overlapping 5x11mm and 5x24mm Absolute Pro Nitinol self-expanding stents beginning just at the knee up to the mid SFA resulting in reduction of 90-95% calcified SFA & Popliteal stenosis to 0. Stenting performed on the  distal common & proximal iliac artery with a 10x4 Absolute Pro- 70-0%.  Marland Kitchen PERIPHERAL VASCULAR ANGIOGRAM  06/17/2010   PTA performed to the right external iliac artery stent using a 5x100 balloon at 10 atmospheres. Stenting performed using a 6x18 Genesis on Opta balloon. Postdilatation with a 7x2 balloon resulting in a 95% "in-stent" stenosis to 0% residual.  . PERIPHERAL VASCULAR ANGIOGRAM  06/04/2011   Bilateral total SFAs not percutaneously addressable. Good canidate for femoropopliteal bypass grafting  . PERIPHERAL VASCULAR ANGIOGRAM  08/28/2015  . PERIPHERAL VASCULAR CATHETERIZATION N/A 08/28/2015   Procedure: Lower Extremity Angiography;  Surgeon: Runell Gess, MD;  Location: [facility] INVASIVE CV LAB;  Service: Cardiovascular;  Laterality: N/A;  . PERIPHERAL VASCULAR CATHETERIZATION N/A 08/28/2015   Procedure: Abdominal Aortogram;  Surgeon: Runell Gess, MD;  Location: [facility] INVASIVE CV LAB;  Service: Cardiovascular;  Laterality: N/A;  . PERIPHERAL VASCULAR CATHETERIZATION Left 08/28/2015   Procedure: Peripheral Vascular Intervention;  Surgeon: Runell Gess, MD;  Location: [facility] INVASIVE CV LAB;  Service: Cardiovascular;  Laterality: Left;  common iliac  . PERIPHERAL VASCULAR CATHETERIZATION Left 08/28/2015   Procedure: Peripheral  Vascular Atherectomy;  Surgeon: Runell Gess, MD;  Location: [facility] INVASIVE CV LAB;  Service: Cardiovascular;  Laterality: Left;  common iliac  . PERIPHERAL VASCULAR CATHETERIZATION N/A 09/28/2015   Procedure: Lower Extremity Angiography;  Surgeon: Runell Gess, MD;  Location: [facility] INVASIVE CV LAB;  Service: Cardiovascular;  Laterality: N/A;  . PERIPHERAL VASCULAR CATHETERIZATION Left 09/28/2015   Procedure: Peripheral Vascular Intervention;  Surgeon: Runell Gess, MD;  Location: [facility] INVASIVE CV LAB;  Service: Cardiovascular;  Laterality: Left CFA  PCI with 9 mm x 4 cm Abbott nitinol absolute Pro self-expanding stent     . SKIN SPLIT GRAFT Left 12/01/2015   Procedure: LEFT FOOT SKIN GRAFT AND VAC;  Surgeon: [name], MD;  Location: [facility] OR;  Service: Orthopedics;  Laterality: Left;  . TRANSTHORACIC ECHOCARDIOGRAM  10/17/2009   EF 45-50%, moderate hypokinesis of the entire inferolateral myocardium, mild concentric hypertrophy and mild regurg of the mitral valva.  Marland Kitchen VEIN HARVEST Left 11/02/2015   Procedure: LEFT GREATER SAPHENOUS VEIN HARVEST;  Surgeon: Chuck Hint, MD;  Location: [facility] OR;  Service: Vascular;  Laterality: Left;   Social History   Occupational History  . retired    Social History Main Topics  . Smoking status: Former Smoker    Packs/day: 1.50    Years: 41.00    Types: Cigarettes    Quit date: 10/12/2009  . Smokeless tobacco: Never Used  . Alcohol use No  . Drug use: No  . Sexual activity: Not Currently    Birth control/ protection: Surgical

## 2016-08-04 ENCOUNTER — Other Ambulatory Visit: Payer: Self-pay | Admitting: Cardiovascular Disease

## 2016-08-07 DIAGNOSIS — I739 Peripheral vascular disease, unspecified: Secondary | ICD-10-CM | POA: Diagnosis not present

## 2016-08-07 DIAGNOSIS — I509 Heart failure, unspecified: Secondary | ICD-10-CM | POA: Diagnosis not present

## 2016-08-07 DIAGNOSIS — L97509 Non-pressure chronic ulcer of other part of unspecified foot with unspecified severity: Secondary | ICD-10-CM | POA: Diagnosis not present

## 2016-08-15 ENCOUNTER — Ambulatory Visit (INDEPENDENT_AMBULATORY_CARE_PROVIDER_SITE_OTHER): Payer: Medicare Other | Admitting: Family

## 2016-08-15 ENCOUNTER — Encounter (INDEPENDENT_AMBULATORY_CARE_PROVIDER_SITE_OTHER): Payer: Self-pay | Admitting: Family

## 2016-08-15 DIAGNOSIS — E1122 Type 2 diabetes mellitus with diabetic chronic kidney disease: Secondary | ICD-10-CM

## 2016-08-15 DIAGNOSIS — L03031 Cellulitis of right toe: Secondary | ICD-10-CM | POA: Diagnosis not present

## 2016-08-15 DIAGNOSIS — E1165 Type 2 diabetes mellitus with hyperglycemia: Secondary | ICD-10-CM

## 2016-08-15 DIAGNOSIS — I739 Peripheral vascular disease, unspecified: Secondary | ICD-10-CM

## 2016-08-15 DIAGNOSIS — E1142 Type 2 diabetes mellitus with diabetic polyneuropathy: Secondary | ICD-10-CM

## 2016-08-15 DIAGNOSIS — L97421 Non-pressure chronic ulcer of left heel and midfoot limited to breakdown of skin: Secondary | ICD-10-CM

## 2016-08-15 DIAGNOSIS — Z794 Long term (current) use of insulin: Secondary | ICD-10-CM

## 2016-08-15 DIAGNOSIS — Z945 Skin transplant status: Secondary | ICD-10-CM

## 2016-08-15 NOTE — Progress Notes (Signed)
Office Visit Note   Patient: [name] [name]           Date of Birth: 1950/11/05           MRN: [id number] Visit Date: 08/15/2016              Requested by: Georgann Housekeeper, MD [address] [place], [place] [address] PCP: Georgann Housekeeper, MD  Chief Complaint  Patient presents with  . Right 2nd Toe - Follow-up  . Left Foot - Follow-up    HPI:  The patient is 66 year old woman who presents today in follow-up for ulceration to her left foot plantar aspect. Did have skin grafting in November of last year. She's been nonweightbearing wearing the silver sock daily. Complains of dry flaking skin over her lower extremity and foot. She continues to be pleased with the wound healing. Overall states she feels well.  Also seen in follow up for elevation of the right great toenail with drainage and second toe medial ulceration with pain. Some drainage from second toe as well. Was prescribed Doxycycline at last visit. Did not take this.   States was nervous of side effects. Notes her sister died of C Diff 2 years ago, is nervous to get C diff herself.       Assessment & Plan: Visit Diagnoses:  1. PVD (peripheral vascular disease) with claudication (HCC)   2. Uncontrolled type 2 diabetes mellitus with chronic kidney disease, with long-term current use of insulin, unspecified CKD stage (HCC)   3. DM type 2 with diabetic peripheral neuropathy (HCC)   4. Midfoot ulcer, left, limited to breakdown of skin (HCC)   5. H/O skin graft   6. Paronychia of great toe of right foot     Plan: We'll have her continue wearing the medical compression socks to the LLE. she will change this daily. Wear around-the-clock. Continue to minimize weight bearing until healed.   Encouraged her to begin the course of oral abx. Did remove the right great toe nail. Continue daily dial soap cleansing to great toe and second to right foot. Apply antibacterial ointment dressings.   Follow-Up Instructions: Return  in about 3 weeks (around 09/05/2016).   Ortho Exam On examination patient is alert oriented no adenopathy well-dressed normal affect normal respiratory effort she is an bleeding in a wheelchair. There is mild pitting edema up to the tibial tubercle worse in the left than the right. Patient has a palpable pulse the plantar aspect of the left leg shows excellent healing there is no redness no synovitis no drainage no signs of infection there is 100% beefy granulation tissue the wound is 30 mm x 5 mm and 0.1 mm deep. Left great toe nail elevated. Is thickened and discolored. Purulence noted. Slight erythema to Gt and 2nd toe extends over dorsum of foot. No induration.  SEcond toe with medial ulcer, appears to be from pressure from GT nail. Is 5 mm in diameter. Filled in with exudative tissue. No drainage. The Toe is tender.   ROS:  All other review of systems negative except as mentioned in history of present illness. Imaging: No results found.  Labs: Lab Results  Component Value Date   HGBA1C 7.6 (H) 11/02/2015   HGBA1C 8.3 (H) 09/01/2015   HGBA1C 13.2 (H) 03/03/2013   ESRSEDRATE 44 (H) 01/02/2016   ESRSEDRATE 135 (H) 11/02/2015   CRP 5.1 01/02/2016   CRP 3.7 (H) 11/02/2015   REPTSTATUS 11/15/2015 FINAL 11/10/2015   REPTSTATUS  11/15/2015 FINAL 11/10/2015   GRAMSTAIN  11/10/2015    ABUNDANT WBC PRESENT, PREDOMINANTLY PMN NO ORGANISMS SEEN    GRAMSTAIN  11/10/2015    ABUNDANT WBC PRESENT, PREDOMINANTLY PMN RARE GRAM POSITIVE COCCI IN PAIRS    CULT  11/10/2015    FEW GROUP B STREP(S.AGALACTIAE)ISOLATED TESTING AGAINST S. AGALACTIAE NOT ROUTINELY PERFORMED DUE TO PREDICTABILITY OF AMP/PEN/VAN SUSCEPTIBILITY. NO ANAEROBES ISOLATED    CULT  11/10/2015    RARE GROUP B STREP(S.AGALACTIAE)ISOLATED TESTING AGAINST S. AGALACTIAE NOT ROUTINELY PERFORMED DUE TO PREDICTABILITY OF AMP/PEN/VAN SUSCEPTIBILITY. NO ANAEROBES ISOLATED     Orders:  No orders of the defined types were placed in  this encounter.  No orders of the defined types were placed in this encounter.    Procedures: Nail Removal Date/Time: 08/15/2016 1:46 PM Performed by: Adonis Huguenin Authorized by: Barnie Del R   Consent:    Consent obtained:  Verbal   Consent given by:  Patient   Risks discussed:  Bleeding, incomplete removal, infection, pain and permanent nail deformity   Alternatives discussed:  No treatment, delayed treatment, alternative treatment and observation Universal protocol:    Procedure explained and questions answered to patient or proxy's satisfaction: yes     Relevant documents present and verified: yes     Test results available and properly labeled: yes     Imaging studies available: yes     Required blood products, implants, devices, and special equipment available: yes     Site/side marked: yes     Immediately prior to procedure a time out was called: yes     Patient identity confirmed:  Verbally with patient Location:    Foot:  R big toe Pre-procedure details:    Skin preparation:  Betadine   Preparation: Patient was prepped and draped in the usual sterile fashion   Anesthesia (see MAR for exact dosages):    Anesthesia method:  Local infiltration   Local anesthetic:  Lidocaine 1% w/o epi Nail Removal:    Nail removed:  Complete Post-procedure details:    Dressing:  4x4 sterile gauze and Xeroform gauze   Patient tolerance of procedure:  Tolerated well, no immediate complications    Clinical Data: No additional findings.  Subjective: Review of Systems  Constitutional: Negative for chills and fever.  Skin: Positive for wound. Negative for color change.    Objective: Vital Signs: There were no vitals taken for this visit.  Specialty Comments:  No specialty comments available.  PMFS History: Patient Active Problem List   Diagnosis Date Noted  . Midfoot ulcer, left, limited to breakdown of skin (HCC) 01/08/2016  . Thigh hematoma 12/22/2015  . History of  blood transfusion 12/22/2015  . Acute on chronic systolic CHF (congestive heart failure) (HCC) 12/22/2015  . H/O skin graft 12/11/2015  . NSTEMI (non-ST elevated myocardial infarction) (HCC) 11/19/2015  . DM type 2 with diabetic peripheral neuropathy (HCC) 11/19/2015  . Traumatic hemorrhagic shock (HCC)   . Chronic systolic CHF (congestive heart failure) (HCC)   . Uncontrolled type 2 diabetes mellitus with complication (HCC)   . Demand myocardial infarction (HCC) 11/08/2015  . Surgery, elective   . Central line infection, initial encounter   . Septic shock (HCC) 11/03/2015  . Encephalopathy acute 11/03/2015  . Acute blood loss anemia 11/03/2015  . Acute respiratory failure with hypoxia (HCC)   . CVA (cerebral vascular accident) (HCC)   . S/P femoral-popliteal bypass surgery   . Malnutrition of moderate degree 11/01/2015  . Diabetic ulcer of  left midfoot associated with diabetes mellitus due to underlying condition, with muscle involvement without evidence of necrosis (HCC)   . Hypertensive heart disease with chronic diastolic congestive heart failure (HCC) 10/31/2015  . Osteomyelitis of left foot (HCC) 10/30/2015  . PVD (peripheral vascular disease) with claudication (HCC) 09/28/2015  . Critical lower limb ischemia 08/28/2015  . Hyperlipidemia with target LDL less than 70 06/28/2013  . Diabetes mellitus type II, uncontrolled (HCC) 03/03/2013  . Complicated migraine 03/03/2013  . Stroke-like symptom 03/02/2013  . Diabetes mellitus (HCC) 03/02/2013  . Aphasia 03/02/2013  . Headache(784.0) 03/02/2013  . PAD (peripheral artery disease) (HCC) 06/05/2011  . Coronary artery disease involving native coronary artery of native heart without angina pectoris 06/05/2011  . Hypotension 06/05/2011  . Claudication in peripheral vascular disease:  Lifestyle limiting. 06/05/2011  . Hx of tobacco use, presenting hazards to health 06/05/2011   Past Medical History:  Diagnosis Date  . Anemia 10/2015    Acute Blood Loss  . Arthritis    "feel like I have it all over" (08/28/2015)  . CHF (congestive heart failure) (HCC)   . Complication of anesthesia    DIFFICULT WAKING "only when I was smoking; no problems since I quit"  . Coronary artery disease   . Family history of adverse reaction to anesthesia    sister slow to wake up  . GERD (gastroesophageal reflux disease)    takes Protonix daily   . Hip bursitis   . History of blood transfusion    10/2015  . Hyperlipidemia LDL goal < 70 06/28/2013   takes Atorvastatin daily  . Hypertension    takes Metoprolol and Imdur daily  . Malnutrition (HCC)   . Migraine    "none in a long time" (08/28/2015)  . Myocardial infarction (HCC) 2011  . Neuromuscular disorder (HCC)    DIABETIC NEUROPATHY  . Osteomyelitis (HCC) 2017   Left foot  . PAD (peripheral artery disease) (HCC)   . Peripheral vascular disease (HCC)   . Respiratory failure (HCC) 10/2015   Acute Hypoxia- acute pulmonary edema 11/13/2015  . Septic shock (HCC) 10/2015  . Stroke (HCC)   . Type II diabetes mellitus (HCC)    takes Lantus nightly.Average fasting blood sugar runs 80-90  Type II    Family History  Problem Relation [name] of Onset  . Hypertension Mother   . Heart failure Mother   . Heart failure Father   . Stroke Father   . Diabetes Father     Past Surgical History:  Procedure Laterality Date  . ABDOMINAL HYSTERECTOMY    . APPENDECTOMY    . ATHERECTOMY N/A 06/04/2011   Procedure: ATHERECTOMY;  Surgeon: Runell Gess, MD;  Location: [facility] CATH LAB;  Service: Cardiovascular;  Laterality: N/A;  . CARDIAC CATHETERIZATION  10/13/2009   95% stenosis in the AV groove circumflex and 95% ostial stenosis in small OM3. A 3x18mm drug-eluting Promus stent inserted ito the circumflex. Dilatated with a 3.25x24mm noncompliant Quantum balloon within entire segment. The entire region was reduced to 0% and brisk TIMI3 flow.  . CAROTID DUPLEX  03/19/2011   Right ICA-demonstrates complete  occlusion. Left ICA-demonstrates a small amount of fibrous plaque.  Marland Kitchen CATARACT EXTRACTION W/ INTRAOCULAR LENS IMPLANT Right   . CESAREAN SECTION  1990  . CORONARY ANGIOPLASTY    . ENDARTERECTOMY FEMORAL Left 09/05/2015   Procedure: ENDARTERECTOMY FEMORAL WITH PROFUNDOPLASTY;  Surgeon: Chuck Hint, MD;  Location: [facility] OR;  Service: Vascular;  Laterality: Left;  Left common  femoral artery vein patch using left saphenous vien  . FEMORAL-POPLITEAL BYPASS GRAFT Left 11/02/2015   Procedure: BYPASS GRAFT FEMORAL-POPLITEAL ARTERY VS FEMORAL-TIBIAL ARTERY BYPASS;  Surgeon: Chuck Hint, MD;  Location: [facility] OR;  Service: Vascular;  Laterality: Left;  . I&D EXTREMITY Left 11/10/2015   Procedure: Debridement Left Foot Ulcer, Application  Wound VAC;  Surgeon: [name], MD;  Location: [facility] OR;  Service: Orthopedics;  Laterality: Left;  . ILIAC ARTERY STENT Left 08/28/2015   common  . INTRAOPERATIVE ARTERIOGRAM Left 09/05/2015   Procedure: INTRA OPERATIVE ARTERIOGRAM;  Surgeon: Chuck Hint, MD;  Location: [facility] OR;  Service: Vascular;  Laterality: Left;  . INTRAOPERATIVE ARTERIOGRAM Left 11/02/2015   Procedure: INTRA OPERATIVE ARTERIOGRAM;  Surgeon: Chuck Hint, MD;  Location: [facility] OR;  Service: Vascular;  Laterality: Left;  . LEXISCAN MYOVIEW  10/25/2010   Moderate perfusion defect due to infarct/scar with mild perinfarct ischemia seen in the Basal Inferolateral, Basal Anterolateral, Mid Inferolateral, and Mid Anterolateral regions. Post-stress EF is 50%.  . OVARY SURGERY  1983?   "ruptured"  . PERIPHERAL VASCULAR ANGIOGRAM  01/26/2010   High-grade SFA disease: left greater than right. Left SFA would require fem-pop bypass grafting. Right SFA could be stented but might require Diamondback Orbital atherectomy.  Marland Kitchen PERIPHERAL VASCULAR ANGIOGRAM  02/23/2010   Stealth Predator orbital rotational atherectomy performed on SFA & Popliteal up to 90,000 RPM. Stenting using overlapping 5x197mm  and 5x28mm Absolute Pro Nitinol self-expanding stents beginning just at the knee up to the mid SFA resulting in reduction of 90-95% calcified SFA & Popliteal stenosis to 0. Stenting performed on the distal common & proximal iliac artery with a 10x4 Absolute Pro- 70-0%.  Marland Kitchen PERIPHERAL VASCULAR ANGIOGRAM  06/17/2010   PTA performed to the right external iliac artery stent using a 5x100 balloon at 10 atmospheres. Stenting performed using a 6x18 Genesis on Opta balloon. Postdilatation with a 7x2 balloon resulting in a 95% "in-stent" stenosis to 0% residual.  . PERIPHERAL VASCULAR ANGIOGRAM  06/04/2011   Bilateral total SFAs not percutaneously addressable. Good canidate for femoropopliteal bypass grafting  . PERIPHERAL VASCULAR ANGIOGRAM  08/28/2015  . PERIPHERAL VASCULAR CATHETERIZATION N/A 08/28/2015   Procedure: Lower Extremity Angiography;  Surgeon: Runell Gess, MD;  Location: [facility] INVASIVE CV LAB;  Service: Cardiovascular;  Laterality: N/A;  . PERIPHERAL VASCULAR CATHETERIZATION N/A 08/28/2015   Procedure: Abdominal Aortogram;  Surgeon: Runell Gess, MD;  Location: [facility] INVASIVE CV LAB;  Service: Cardiovascular;  Laterality: N/A;  . PERIPHERAL VASCULAR CATHETERIZATION Left 08/28/2015   Procedure: Peripheral Vascular Intervention;  Surgeon: Runell Gess, MD;  Location: [facility] INVASIVE CV LAB;  Service: Cardiovascular;  Laterality: Left;  common iliac  . PERIPHERAL VASCULAR CATHETERIZATION Left 08/28/2015   Procedure: Peripheral Vascular Atherectomy;  Surgeon: Runell Gess, MD;  Location: [facility] INVASIVE CV LAB;  Service: Cardiovascular;  Laterality: Left;  common iliac  . PERIPHERAL VASCULAR CATHETERIZATION N/A 09/28/2015   Procedure: Lower Extremity Angiography;  Surgeon: Runell Gess, MD;  Location: [facility] INVASIVE CV LAB;  Service: Cardiovascular;  Laterality: N/A;  . PERIPHERAL VASCULAR CATHETERIZATION Left 09/28/2015   Procedure: Peripheral Vascular Intervention;  Surgeon: Runell Gess, MD;   Location: [facility] INVASIVE CV LAB;  Service: Cardiovascular;  Laterality: Left CFA  PCI with 9 mm x 4 cm Abbott nitinol absolute Pro self-expanding stent     . SKIN SPLIT GRAFT Left 12/01/2015   Procedure: LEFT FOOT SKIN GRAFT AND VAC;  Surgeon: [name], MD;  Location: [facility] OR;  Service: Orthopedics;  Laterality: Left;  . TRANSTHORACIC ECHOCARDIOGRAM  10/17/2009   EF 45-50%, moderate hypokinesis of the entire inferolateral myocardium, mild concentric hypertrophy and mild regurg of the mitral valva.  Marland Kitchen VEIN HARVEST Left 11/02/2015   Procedure: LEFT GREATER SAPHENOUS VEIN HARVEST;  Surgeon: Chuck Hint, MD;  Location: [facility] OR;  Service: Vascular;  Laterality: Left;   Social History   Occupational History  . retired    Social History Main Topics  . Smoking status: Former Smoker    Packs/day: 1.50    Years: 41.00    Types: Cigarettes    Quit date: 10/12/2009  . Smokeless tobacco: Never Used  . Alcohol use No  . Drug use: No  . Sexual activity: Not Currently    Birth control/ protection: Surgical

## 2016-08-27 ENCOUNTER — Other Ambulatory Visit: Payer: Self-pay | Admitting: Cardiovascular Disease

## 2016-08-30 ENCOUNTER — Other Ambulatory Visit: Payer: Self-pay | Admitting: Cardiovascular Disease

## 2016-09-12 ENCOUNTER — Ambulatory Visit (INDEPENDENT_AMBULATORY_CARE_PROVIDER_SITE_OTHER): Payer: Medicare Other | Admitting: Family

## 2016-09-12 DIAGNOSIS — R609 Edema, unspecified: Secondary | ICD-10-CM

## 2016-09-12 DIAGNOSIS — L97425 Non-pressure chronic ulcer of left heel and midfoot with muscle involvement without evidence of necrosis: Secondary | ICD-10-CM

## 2016-09-12 DIAGNOSIS — Z945 Skin transplant status: Secondary | ICD-10-CM

## 2016-09-12 DIAGNOSIS — E08621 Diabetes mellitus due to underlying condition with foot ulcer: Secondary | ICD-10-CM

## 2016-09-12 DIAGNOSIS — I6521 Occlusion and stenosis of right carotid artery: Secondary | ICD-10-CM

## 2016-09-12 DIAGNOSIS — Z9889 Other specified postprocedural states: Secondary | ICD-10-CM | POA: Diagnosis not present

## 2016-09-12 NOTE — Progress Notes (Signed)
Office Visit Note   Patient: [name] [name]           Date of Birth: 08-Oct-1950           MRN: [id number] Visit Date: 09/12/2016              Requested by: Georgann Housekeeper, MD [address] [place], [place] [address] PCP: Georgann Housekeeper, MD  No chief complaint on file.   HPI:  The patient is 66 year old woman who presents today in follow-up for ulceration to her left foot plantar aspect. Did have skin grafting in November of last year. She's been nonweightbearing wearing the silver sock daily. Complains of dry flaking skin over her lower extremity and foot. She continues to be pleased with the wound healing. Overall states she feels well.  Complaining of swelling to her right lower extremity with anterior blistering 3 today. Has been wearing her compression garment to the left lower extremity not the right.     Assessment & Plan: Visit Diagnoses:  1. Diabetic ulcer of left midfoot associated with diabetes mellitus due to underlying condition, with muscle involvement without evidence of necrosis (HCC)   2. H/O skin graft   3. Peripheral edema     Plan: Compression stockings to bilateral lower extremities. she will change this daily. Wear around-the-clock. May begin touchdown weightbearing through the heel on the left.    Follow-Up Instructions: Return in about 4 weeks (around 10/10/2016).   Ortho Exam On examination patient is alert oriented no adenopathy well-dressed normal affect normal respiratory effort she is an bleeding in a wheelchair. There is mild pitting edema up to the tibial tubercle worse in the left than the right. Patient has a palpable pulse the plantar aspect of the left leg shows excellent healing there is no redness no synovitis no drainage no signs of infection there is 100% beefy granulation tissue the wound is 25 mm x 4 mm and 0.1 mm deep. Left lower extremity with pitting edema there is anterior blistering of the 3 cm in diameter this is filled  with serous. Strain today withoutrunning erythema no warmth no sign of infection    ROS:  All other review of systems negative except as mentioned in history of present illness. Imaging: No results found.  Labs: Lab Results  Component Value Date   HGBA1C 7.6 (H) 11/02/2015   HGBA1C 8.3 (H) 09/01/2015   HGBA1C 13.2 (H) 03/03/2013   ESRSEDRATE 44 (H) 01/02/2016   ESRSEDRATE 135 (H) 11/02/2015   CRP 5.1 01/02/2016   CRP 3.7 (H) 11/02/2015   REPTSTATUS 11/15/2015 FINAL 11/10/2015   REPTSTATUS 11/15/2015 FINAL 11/10/2015   GRAMSTAIN  11/10/2015    ABUNDANT WBC PRESENT, PREDOMINANTLY PMN NO ORGANISMS SEEN    GRAMSTAIN  11/10/2015    ABUNDANT WBC PRESENT, PREDOMINANTLY PMN RARE GRAM POSITIVE COCCI IN PAIRS    CULT  11/10/2015    FEW GROUP B STREP(S.AGALACTIAE)ISOLATED TESTING AGAINST S. AGALACTIAE NOT ROUTINELY PERFORMED DUE TO PREDICTABILITY OF AMP/PEN/VAN SUSCEPTIBILITY. NO ANAEROBES ISOLATED    CULT  11/10/2015    RARE GROUP B STREP(S.AGALACTIAE)ISOLATED TESTING AGAINST S. AGALACTIAE NOT ROUTINELY PERFORMED DUE TO PREDICTABILITY OF AMP/PEN/VAN SUSCEPTIBILITY. NO ANAEROBES ISOLATED     Orders:  No orders of the defined types were placed in this encounter.  No orders of the defined types were placed in this encounter.    Procedures: No procedures performed  Clinical Data: No additional findings.  Subjective: Review of Systems  Constitutional: Negative for chills  and fever.  Skin: Positive for wound. Negative for color change.    Objective: Vital Signs: There were no vitals taken for this visit.  Specialty Comments:  No specialty comments available.  PMFS History: Patient Active Problem List   Diagnosis Date Noted  . Midfoot ulcer, left, limited to breakdown of skin (HCC) 01/08/2016  . Thigh hematoma 12/22/2015  . History of blood transfusion 12/22/2015  . Acute on chronic systolic CHF (congestive heart failure) (HCC) 12/22/2015  . H/O skin graft  12/11/2015  . NSTEMI (non-ST elevated myocardial infarction) (HCC) 11/19/2015  . DM type 2 with diabetic peripheral neuropathy (HCC) 11/19/2015  . Traumatic hemorrhagic shock (HCC)   . Chronic systolic CHF (congestive heart failure) (HCC)   . Uncontrolled type 2 diabetes mellitus with complication (HCC)   . Demand myocardial infarction (HCC) 11/08/2015  . Surgery, elective   . Central line infection, initial encounter   . Septic shock (HCC) 11/03/2015  . Encephalopathy acute 11/03/2015  . Acute blood loss anemia 11/03/2015  . Acute respiratory failure with hypoxia (HCC)   . CVA (cerebral vascular accident) (HCC)   . S/P femoral-popliteal bypass surgery   . Malnutrition of moderate degree 11/01/2015  . Diabetic ulcer of left midfoot associated with diabetes mellitus due to underlying condition, with muscle involvement without evidence of necrosis (HCC)   . Hypertensive heart disease with chronic diastolic congestive heart failure (HCC) 10/31/2015  . Osteomyelitis of left foot (HCC) 10/30/2015  . PVD (peripheral vascular disease) with claudication (HCC) 09/28/2015  . Critical lower limb ischemia 08/28/2015  . Hyperlipidemia with target LDL less than 70 06/28/2013  . Diabetes mellitus type II, uncontrolled (HCC) 03/03/2013  . Complicated migraine 03/03/2013  . Stroke-like symptom 03/02/2013  . Diabetes mellitus (HCC) 03/02/2013  . Aphasia 03/02/2013  . Headache(784.0) 03/02/2013  . PAD (peripheral artery disease) (HCC) 06/05/2011  . Coronary artery disease involving native coronary artery of native heart without angina pectoris 06/05/2011  . Hypotension 06/05/2011  . Claudication in peripheral vascular disease:  Lifestyle limiting. 06/05/2011  . Hx of tobacco use, presenting hazards to health 06/05/2011   Past Medical History:  Diagnosis Date  . Anemia 10/2015   Acute Blood Loss  . Arthritis    "feel like I have it all over" (08/28/2015)  . CHF (congestive heart failure) (HCC)     . Complication of anesthesia    DIFFICULT WAKING "only when I was smoking; no problems since I quit"  . Coronary artery disease   . Family history of adverse reaction to anesthesia    sister slow to wake up  . GERD (gastroesophageal reflux disease)    takes Protonix daily   . Hip bursitis   . History of blood transfusion    10/2015  . Hyperlipidemia LDL goal < 70 06/28/2013   takes Atorvastatin daily  . Hypertension    takes Metoprolol and Imdur daily  . Malnutrition (HCC)   . Migraine    "none in a long time" (08/28/2015)  . Myocardial infarction (HCC) 2011  . Neuromuscular disorder (HCC)    DIABETIC NEUROPATHY  . Osteomyelitis (HCC) 2017   Left foot  . PAD (peripheral artery disease) (HCC)   . Peripheral vascular disease (HCC)   . Respiratory failure (HCC) 10/2015   Acute Hypoxia- acute pulmonary edema 11/13/2015  . Septic shock (HCC) 10/2015  . Stroke (HCC)   . Type II diabetes mellitus (HCC)    takes Lantus nightly.Average fasting blood sugar runs 80-90  Type II  Family History  Problem Relation [name] of Onset  . Hypertension Mother   . Heart failure Mother   . Heart failure Father   . Stroke Father   . Diabetes Father     Past Surgical History:  Procedure Laterality Date  . ABDOMINAL HYSTERECTOMY    . APPENDECTOMY    . ATHERECTOMY N/A 06/04/2011   Procedure: ATHERECTOMY;  Surgeon: Runell Gess, MD;  Location: [facility] CATH LAB;  Service: Cardiovascular;  Laterality: N/A;  . CARDIAC CATHETERIZATION  10/13/2009   95% stenosis in the AV groove circumflex and 95% ostial stenosis in small OM3. A 3x21mm drug-eluting Promus stent inserted ito the circumflex. Dilatated with a 3.25x38mm noncompliant Quantum balloon within entire segment. The entire region was reduced to 0% and brisk TIMI3 flow.  . CAROTID DUPLEX  03/19/2011   Right ICA-demonstrates complete occlusion. Left ICA-demonstrates a small amount of fibrous plaque.  Marland Kitchen CATARACT EXTRACTION W/ INTRAOCULAR LENS IMPLANT  Right   . CESAREAN SECTION  1990  . CORONARY ANGIOPLASTY    . ENDARTERECTOMY FEMORAL Left 09/05/2015   Procedure: ENDARTERECTOMY FEMORAL WITH PROFUNDOPLASTY;  Surgeon: Chuck Hint, MD;  Location: [facility] OR;  Service: Vascular;  Laterality: Left;  Left common femoral artery vein patch using left saphenous vien  . FEMORAL-POPLITEAL BYPASS GRAFT Left 11/02/2015   Procedure: BYPASS GRAFT FEMORAL-POPLITEAL ARTERY VS FEMORAL-TIBIAL ARTERY BYPASS;  Surgeon: Chuck Hint, MD;  Location: [facility] OR;  Service: Vascular;  Laterality: Left;  . I&D EXTREMITY Left 11/10/2015   Procedure: Debridement Left Foot Ulcer, Application  Wound VAC;  Surgeon: [name], MD;  Location: [facility] OR;  Service: Orthopedics;  Laterality: Left;  . ILIAC ARTERY STENT Left 08/28/2015   common  . INTRAOPERATIVE ARTERIOGRAM Left 09/05/2015   Procedure: INTRA OPERATIVE ARTERIOGRAM;  Surgeon: Chuck Hint, MD;  Location: [facility] OR;  Service: Vascular;  Laterality: Left;  . INTRAOPERATIVE ARTERIOGRAM Left 11/02/2015   Procedure: INTRA OPERATIVE ARTERIOGRAM;  Surgeon: Chuck Hint, MD;  Location: [facility] OR;  Service: Vascular;  Laterality: Left;  . LEXISCAN MYOVIEW  10/25/2010   Moderate perfusion defect due to infarct/scar with mild perinfarct ischemia seen in the Basal Inferolateral, Basal Anterolateral, Mid Inferolateral, and Mid Anterolateral regions. Post-stress EF is 50%.  . OVARY SURGERY  1983?   "ruptured"  . PERIPHERAL VASCULAR ANGIOGRAM  01/26/2010   High-grade SFA disease: left greater than right. Left SFA would require fem-pop bypass grafting. Right SFA could be stented but might require Diamondback Orbital atherectomy.  Marland Kitchen PERIPHERAL VASCULAR ANGIOGRAM  02/23/2010   Stealth Predator orbital rotational atherectomy performed on SFA & Popliteal up to 90,000 RPM. Stenting using overlapping 5x157mm and 5x83mm Absolute Pro Nitinol self-expanding stents beginning just at the knee up to the mid SFA resulting in  reduction of 90-95% calcified SFA & Popliteal stenosis to 0. Stenting performed on the distal common & proximal iliac artery with a 10x4 Absolute Pro- 70-0%.  Marland Kitchen PERIPHERAL VASCULAR ANGIOGRAM  06/17/2010   PTA performed to the right external iliac artery stent using a 5x100 balloon at 10 atmospheres. Stenting performed using a 6x18 Genesis on Opta balloon. Postdilatation with a 7x2 balloon resulting in a 95% "in-stent" stenosis to 0% residual.  . PERIPHERAL VASCULAR ANGIOGRAM  06/04/2011   Bilateral total SFAs not percutaneously addressable. Good canidate for femoropopliteal bypass grafting  . PERIPHERAL VASCULAR ANGIOGRAM  08/28/2015  . PERIPHERAL VASCULAR CATHETERIZATION N/A 08/28/2015   Procedure: Lower Extremity Angiography;  Surgeon: Runell Gess, MD;  Location: [facility] INVASIVE  CV LAB;  Service: Cardiovascular;  Laterality: N/A;  . PERIPHERAL VASCULAR CATHETERIZATION N/A 08/28/2015   Procedure: Abdominal Aortogram;  Surgeon: Runell Gess, MD;  Location: [facility] INVASIVE CV LAB;  Service: Cardiovascular;  Laterality: N/A;  . PERIPHERAL VASCULAR CATHETERIZATION Left 08/28/2015   Procedure: Peripheral Vascular Intervention;  Surgeon: Runell Gess, MD;  Location: [facility] INVASIVE CV LAB;  Service: Cardiovascular;  Laterality: Left;  common iliac  . PERIPHERAL VASCULAR CATHETERIZATION Left 08/28/2015   Procedure: Peripheral Vascular Atherectomy;  Surgeon: Runell Gess, MD;  Location: [facility] INVASIVE CV LAB;  Service: Cardiovascular;  Laterality: Left;  common iliac  . PERIPHERAL VASCULAR CATHETERIZATION N/A 09/28/2015   Procedure: Lower Extremity Angiography;  Surgeon: Runell Gess, MD;  Location: [facility] INVASIVE CV LAB;  Service: Cardiovascular;  Laterality: N/A;  . PERIPHERAL VASCULAR CATHETERIZATION Left 09/28/2015   Procedure: Peripheral Vascular Intervention;  Surgeon: Runell Gess, MD;  Location: [facility] INVASIVE CV LAB;  Service: Cardiovascular;  Laterality: Left CFA  PCI with 9 mm x 4 cm Abbott nitinol  absolute Pro self-expanding stent     . SKIN SPLIT GRAFT Left 12/01/2015   Procedure: LEFT FOOT SKIN GRAFT AND VAC;  Surgeon: [name], MD;  Location: [facility] OR;  Service: Orthopedics;  Laterality: Left;  . TRANSTHORACIC ECHOCARDIOGRAM  10/17/2009   EF 45-50%, moderate hypokinesis of the entire inferolateral myocardium, mild concentric hypertrophy and mild regurg of the mitral valva.  Marland Kitchen VEIN HARVEST Left 11/02/2015   Procedure: LEFT GREATER SAPHENOUS VEIN HARVEST;  Surgeon: Chuck Hint, MD;  Location: [facility] OR;  Service: Vascular;  Laterality: Left;   Social History   Occupational History  . retired    Social History Main Topics  . Smoking status: Former Smoker    Packs/day: 1.50    Years: 41.00    Types: Cigarettes    Quit date: 10/12/2009  . Smokeless tobacco: Never Used  . Alcohol use No  . Drug use: No  . Sexual activity: Not Currently    Birth control/ protection: Surgical

## 2016-09-13 ENCOUNTER — Other Ambulatory Visit: Payer: Self-pay | Admitting: Cardiovascular Disease

## 2016-09-22 ENCOUNTER — Other Ambulatory Visit: Payer: Self-pay | Admitting: Physician Assistant

## 2016-09-23 NOTE — Telephone Encounter (Signed)
REFILL 

## 2016-09-23 NOTE — Telephone Encounter (Signed)
Please review for refill, thanks ! 

## 2016-09-24 ENCOUNTER — Telehealth (INDEPENDENT_AMBULATORY_CARE_PROVIDER_SITE_OTHER): Payer: Self-pay

## 2016-09-24 NOTE — Telephone Encounter (Signed)
Patient states that her "blister is still oozing and she is wondering if she needs antibiotic or to come in? She states that she couldn't wear the socks cause they were ripping her skin? She would like a call back

## 2016-09-25 NOTE — Telephone Encounter (Signed)
I called and spoke with patient to advise of message below. She will call if wound is worsening, or notices any acute changes. Doing neosporin dressing now with compression.

## 2016-09-30 ENCOUNTER — Other Ambulatory Visit: Payer: Self-pay | Admitting: Cardiovascular Disease

## 2016-10-01 ENCOUNTER — Other Ambulatory Visit: Payer: Self-pay | Admitting: Cardiovascular Disease

## 2016-10-01 NOTE — Telephone Encounter (Signed)
Rx(s) sent to pharmacy electronically.  

## 2016-10-09 ENCOUNTER — Ambulatory Visit (INDEPENDENT_AMBULATORY_CARE_PROVIDER_SITE_OTHER): Payer: Medicare Other | Admitting: Family

## 2016-10-09 ENCOUNTER — Encounter (INDEPENDENT_AMBULATORY_CARE_PROVIDER_SITE_OTHER): Payer: Self-pay | Admitting: Family

## 2016-10-09 DIAGNOSIS — I739 Peripheral vascular disease, unspecified: Secondary | ICD-10-CM

## 2016-10-09 DIAGNOSIS — I96 Gangrene, not elsewhere classified: Secondary | ICD-10-CM

## 2016-10-09 DIAGNOSIS — I6521 Occlusion and stenosis of right carotid artery: Secondary | ICD-10-CM

## 2016-10-09 DIAGNOSIS — E1142 Type 2 diabetes mellitus with diabetic polyneuropathy: Secondary | ICD-10-CM | POA: Diagnosis not present

## 2016-10-10 MED ORDER — DOXYCYCLINE HYCLATE 100 MG PO TABS: 100.0000 mg | ORAL_TABLET | Freq: Two times a day (BID) | ORAL | 0 refills | Status: DC

## 2016-10-10 NOTE — Progress Notes (Signed)
Office Visit Note   Patient: [name] [name]           Date of Birth: November 18, 1950           MRN: [id number] Visit Date: 10/09/2016              Requested by: Georgann Housekeeper, MD [address] [place], [place] [address] PCP: Georgann Housekeeper, MD  Chief Complaint  Patient presents with  . Right Foot - Pain  . Right Leg - Pain      HPI: Patient is a 66 year old woman seen today concerned for discoloration and ulceration to right foot great and second toes. She is also status post skin grafting to plantar aspect of left foot and bypass grafting to the LLE. Has not yet had intervention to RLE.  Denies fever or chills or any systemic symptoms.  Last ABI in 2017: - Left posterior tibial and dorsalis pedis pressures were not   obtained due to patient pain tolerance, recent surgery, and   surgical dressings. - Right ABI of 0.28 is suggestive of critical arterial occlusive   disease at rest. Left TBI of 0.2 is suggestive of abnormal   arterial flow into the great toe at rest.  Assessment & Plan: Visit Diagnoses:  1. Gangrene of toe of right foot (HCC)   2. DM type 2 with diabetic peripheral neuropathy (HCC)   3. PAD (peripheral artery disease) (HCC)     Plan: will plan for amputation of great toe and second toe, right foot. Patient understands and is in agreement. Has an appointment with Dr. Edilia Bo next week. Will hold on surgery until vascular surgery has evaluated and blood flow been optimized. Provided Rx for Doxycycline to take until surgery. Will call or return for an concerns or worsening.  Follow-Up Instructions: Return for 1 w post op.   Ortho Exam  Patient is alert, oriented, no adenopathy, well-dressed, normal affect, normal respiratory effort. On examination of right foot. The right great toe distal phalanx is exposed. Has gangrenous changes to great and second toe. There is surrounding maceration. No drainage. No odor. Erythema to foot. No warmth or ascending  cellulitis. No palpable DP or PT pulse. With doppler dampened monophasic heard, DP.   Imaging: No results found. No images are attached to the encounter.  Labs: Lab Results  Component Value Date   HGBA1C 7.6 (H) 11/02/2015   HGBA1C 8.3 (H) 09/01/2015   HGBA1C 13.2 (H) 03/03/2013   ESRSEDRATE 44 (H) 01/02/2016   ESRSEDRATE 135 (H) 11/02/2015   CRP 5.1 01/02/2016   CRP 3.7 (H) 11/02/2015   REPTSTATUS 11/15/2015 FINAL 11/10/2015   REPTSTATUS 11/15/2015 FINAL 11/10/2015   GRAMSTAIN  11/10/2015    ABUNDANT WBC PRESENT, PREDOMINANTLY PMN NO ORGANISMS SEEN    GRAMSTAIN  11/10/2015    ABUNDANT WBC PRESENT, PREDOMINANTLY PMN RARE GRAM POSITIVE COCCI IN PAIRS    CULT  11/10/2015    FEW GROUP B STREP(S.AGALACTIAE)ISOLATED TESTING AGAINST S. AGALACTIAE NOT ROUTINELY PERFORMED DUE TO PREDICTABILITY OF AMP/PEN/VAN SUSCEPTIBILITY. NO ANAEROBES ISOLATED    CULT  11/10/2015    RARE GROUP B STREP(S.AGALACTIAE)ISOLATED TESTING AGAINST S. AGALACTIAE NOT ROUTINELY PERFORMED DUE TO PREDICTABILITY OF AMP/PEN/VAN SUSCEPTIBILITY. NO ANAEROBES ISOLATED     Orders:  No orders of the defined types were placed in this encounter.  Meds ordered this encounter  Medications  . doxycycline (VIBRA-TABS) 100 MG tablet    Sig: Take 1 tablet (100 mg total) by mouth 2 (two)  times daily.    Dispense:  60 tablet    Refill:  0     Procedures: No procedures performed  Clinical Data: No additional findings.  ROS:  All other systems negative, except as noted in the HPI. Review of Systems  Constitutional: Negative for chills and fever.  Cardiovascular: Positive for leg swelling.  Skin: Positive for color change and wound.  Neurological: Positive for numbness.    Objective: Vital Signs: There were no vitals taken for this visit.  Specialty Comments:  No specialty comments available.  PMFS History: Patient Active Problem List   Diagnosis Date Noted  . Midfoot ulcer, left, limited to  breakdown of skin (HCC) 01/08/2016  . Thigh hematoma 12/22/2015  . History of blood transfusion 12/22/2015  . Acute on chronic systolic CHF (congestive heart failure) (HCC) 12/22/2015  . H/O skin graft 12/11/2015  . NSTEMI (non-ST elevated myocardial infarction) (HCC) 11/19/2015  . DM type 2 with diabetic peripheral neuropathy (HCC) 11/19/2015  . Traumatic hemorrhagic shock (HCC)   . Chronic systolic CHF (congestive heart failure) (HCC)   . Uncontrolled type 2 diabetes mellitus with complication (HCC)   . Demand myocardial infarction (HCC) 11/08/2015  . Surgery, elective   . Central line infection, initial encounter   . Septic shock (HCC) 11/03/2015  . Encephalopathy acute 11/03/2015  . Acute blood loss anemia 11/03/2015  . Acute respiratory failure with hypoxia (HCC)   . CVA (cerebral vascular accident) (HCC)   . S/P femoral-popliteal bypass surgery   . Malnutrition of moderate degree 11/01/2015  . Diabetic ulcer of left midfoot associated with diabetes mellitus due to underlying condition, with muscle involvement without evidence of necrosis (HCC)   . Hypertensive heart disease with chronic diastolic congestive heart failure (HCC) 10/31/2015  . Osteomyelitis of left foot (HCC) 10/30/2015  . PVD (peripheral vascular disease) with claudication (HCC) 09/28/2015  . Critical lower limb ischemia 08/28/2015  . Hyperlipidemia with target LDL less than 70 06/28/2013  . Diabetes mellitus type II, uncontrolled (HCC) 03/03/2013  . Complicated migraine 03/03/2013  . Stroke-like symptom 03/02/2013  . Diabetes mellitus (HCC) 03/02/2013  . Aphasia 03/02/2013  . Headache(784.0) 03/02/2013  . PAD (peripheral artery disease) (HCC) 06/05/2011  . Coronary artery disease involving native coronary artery of native heart without angina pectoris 06/05/2011  . Hypotension 06/05/2011  . Claudication in peripheral vascular disease:  Lifestyle limiting. 06/05/2011  . Hx of tobacco use, presenting hazards to  health 06/05/2011   Past Medical History:  Diagnosis Date  . Anemia 10/2015   Acute Blood Loss  . Arthritis    "feel like I have it all over" (08/28/2015)  . CHF (congestive heart failure) (HCC)   . Complication of anesthesia    DIFFICULT WAKING "only when I was smoking; no problems since I quit"  . Coronary artery disease   . Family history of adverse reaction to anesthesia    sister slow to wake up  . GERD (gastroesophageal reflux disease)    takes Protonix daily   . Hip bursitis   . History of blood transfusion    10/2015  . Hyperlipidemia LDL goal < 70 06/28/2013   takes Atorvastatin daily  . Hypertension    takes Metoprolol and Imdur daily  . Malnutrition (HCC)   . Migraine    "none in a long time" (08/28/2015)  . Myocardial infarction (HCC) 2011  . Neuromuscular disorder (HCC)    DIABETIC NEUROPATHY  . Osteomyelitis (HCC) 2017   Left foot  . PAD (  peripheral artery disease) (HCC)   . Peripheral vascular disease (HCC)   . Respiratory failure (HCC) 10/2015   Acute Hypoxia- acute pulmonary edema 11/13/2015  . Septic shock (HCC) 10/2015  . Stroke (HCC)   . Type II diabetes mellitus (HCC)    takes Lantus nightly.Average fasting blood sugar runs 80-90  Type II    Family History  Problem Relation [name] of Onset  . Hypertension Mother   . Heart failure Mother   . Heart failure Father   . Stroke Father   . Diabetes Father     Past Surgical History:  Procedure Laterality Date  . ABDOMINAL HYSTERECTOMY    . APPENDECTOMY    . ATHERECTOMY N/A 06/04/2011   Procedure: ATHERECTOMY;  Surgeon: Runell Gess, MD;  Location: [facility] CATH LAB;  Service: Cardiovascular;  Laterality: N/A;  . CARDIAC CATHETERIZATION  10/13/2009   95% stenosis in the AV groove circumflex and 95% ostial stenosis in small OM3. A 3x53mm drug-eluting Promus stent inserted ito the circumflex. Dilatated with a 3.25x88mm noncompliant Quantum balloon within entire segment. The entire region was reduced to 0% and  brisk TIMI3 flow.  . CAROTID DUPLEX  03/19/2011   Right ICA-demonstrates complete occlusion. Left ICA-demonstrates a small amount of fibrous plaque.  Marland Kitchen CATARACT EXTRACTION W/ INTRAOCULAR LENS IMPLANT Right   . CESAREAN SECTION  1990  . CORONARY ANGIOPLASTY    . ENDARTERECTOMY FEMORAL Left 09/05/2015   Procedure: ENDARTERECTOMY FEMORAL WITH PROFUNDOPLASTY;  Surgeon: Chuck Hint, MD;  Location: [facility] OR;  Service: Vascular;  Laterality: Left;  Left common femoral artery vein patch using left saphenous vien  . FEMORAL-POPLITEAL BYPASS GRAFT Left 11/02/2015   Procedure: BYPASS GRAFT FEMORAL-POPLITEAL ARTERY VS FEMORAL-TIBIAL ARTERY BYPASS;  Surgeon: Chuck Hint, MD;  Location: [facility] OR;  Service: Vascular;  Laterality: Left;  . I&D EXTREMITY Left 11/10/2015   Procedure: Debridement Left Foot Ulcer, Application  Wound VAC;  Surgeon: [name], MD;  Location: [facility] OR;  Service: Orthopedics;  Laterality: Left;  . ILIAC ARTERY STENT Left 08/28/2015   common  . INTRAOPERATIVE ARTERIOGRAM Left 09/05/2015   Procedure: INTRA OPERATIVE ARTERIOGRAM;  Surgeon: Chuck Hint, MD;  Location: [facility] OR;  Service: Vascular;  Laterality: Left;  . INTRAOPERATIVE ARTERIOGRAM Left 11/02/2015   Procedure: INTRA OPERATIVE ARTERIOGRAM;  Surgeon: Chuck Hint, MD;  Location: [facility] OR;  Service: Vascular;  Laterality: Left;  . LEXISCAN MYOVIEW  10/25/2010   Moderate perfusion defect due to infarct/scar with mild perinfarct ischemia seen in the Basal Inferolateral, Basal Anterolateral, Mid Inferolateral, and Mid Anterolateral regions. Post-stress EF is 50%.  . OVARY SURGERY  1983?   "ruptured"  . PERIPHERAL VASCULAR ANGIOGRAM  01/26/2010   High-grade SFA disease: left greater than right. Left SFA would require fem-pop bypass grafting. Right SFA could be stented but might require Diamondback Orbital atherectomy.  Marland Kitchen PERIPHERAL VASCULAR ANGIOGRAM  02/23/2010   Stealth Predator orbital rotational  atherectomy performed on SFA & Popliteal up to 90,000 RPM. Stenting using overlapping 5x175mm and 5x49mm Absolute Pro Nitinol self-expanding stents beginning just at the knee up to the mid SFA resulting in reduction of 90-95% calcified SFA & Popliteal stenosis to 0. Stenting performed on the distal common & proximal iliac artery with a 10x4 Absolute Pro- 70-0%.  Marland Kitchen PERIPHERAL VASCULAR ANGIOGRAM  06/17/2010   PTA performed to the right external iliac artery stent using a 5x100 balloon at 10 atmospheres. Stenting performed using a 6x18 Genesis on Opta balloon. Postdilatation with a 7x2  balloon resulting in a 95% "in-stent" stenosis to 0% residual.  . PERIPHERAL VASCULAR ANGIOGRAM  06/04/2011   Bilateral total SFAs not percutaneously addressable. Good canidate for femoropopliteal bypass grafting  . PERIPHERAL VASCULAR ANGIOGRAM  08/28/2015  . PERIPHERAL VASCULAR CATHETERIZATION N/A 08/28/2015   Procedure: Lower Extremity Angiography;  Surgeon: Runell Gess, MD;  Location: [facility] INVASIVE CV LAB;  Service: Cardiovascular;  Laterality: N/A;  . PERIPHERAL VASCULAR CATHETERIZATION N/A 08/28/2015   Procedure: Abdominal Aortogram;  Surgeon: Runell Gess, MD;  Location: [facility] INVASIVE CV LAB;  Service: Cardiovascular;  Laterality: N/A;  . PERIPHERAL VASCULAR CATHETERIZATION Left 08/28/2015   Procedure: Peripheral Vascular Intervention;  Surgeon: Runell Gess, MD;  Location: [facility] INVASIVE CV LAB;  Service: Cardiovascular;  Laterality: Left;  common iliac  . PERIPHERAL VASCULAR CATHETERIZATION Left 08/28/2015   Procedure: Peripheral Vascular Atherectomy;  Surgeon: Runell Gess, MD;  Location: [facility] INVASIVE CV LAB;  Service: Cardiovascular;  Laterality: Left;  common iliac  . PERIPHERAL VASCULAR CATHETERIZATION N/A 09/28/2015   Procedure: Lower Extremity Angiography;  Surgeon: Runell Gess, MD;  Location: [facility] INVASIVE CV LAB;  Service: Cardiovascular;  Laterality: N/A;  . PERIPHERAL VASCULAR CATHETERIZATION Left  09/28/2015   Procedure: Peripheral Vascular Intervention;  Surgeon: Runell Gess, MD;  Location: [facility] INVASIVE CV LAB;  Service: Cardiovascular;  Laterality: Left CFA  PCI with 9 mm x 4 cm Abbott nitinol absolute Pro self-expanding stent     . SKIN SPLIT GRAFT Left 12/01/2015   Procedure: LEFT FOOT SKIN GRAFT AND VAC;  Surgeon: [name], MD;  Location: [facility] OR;  Service: Orthopedics;  Laterality: Left;  . TRANSTHORACIC ECHOCARDIOGRAM  10/17/2009   EF 45-50%, moderate hypokinesis of the entire inferolateral myocardium, mild concentric hypertrophy and mild regurg of the mitral valva.  Marland Kitchen VEIN HARVEST Left 11/02/2015   Procedure: LEFT GREATER SAPHENOUS VEIN HARVEST;  Surgeon: Chuck Hint, MD;  Location: [facility] OR;  Service: Vascular;  Laterality: Left;   Social History   Occupational History  . retired    Social History Main Topics  . Smoking status: Former Smoker    Packs/day: 1.50    Years: 41.00    Types: Cigarettes    Quit date: 10/12/2009  . Smokeless tobacco: Never Used  . Alcohol use No  . Drug use: No  . Sexual activity: Not Currently    Birth control/ protection: Surgical

## 2016-10-14 ENCOUNTER — Ambulatory Visit (INDEPENDENT_AMBULATORY_CARE_PROVIDER_SITE_OTHER): Payer: Medicare Other | Admitting: Family

## 2016-10-16 ENCOUNTER — Telehealth: Payer: Self-pay | Admitting: *Deleted

## 2016-10-16 ENCOUNTER — Ambulatory Visit (INDEPENDENT_AMBULATORY_CARE_PROVIDER_SITE_OTHER): Payer: Medicare Other | Admitting: Family

## 2016-10-16 ENCOUNTER — Encounter: Payer: Self-pay | Admitting: Cardiovascular Disease

## 2016-10-16 ENCOUNTER — Ambulatory Visit (INDEPENDENT_AMBULATORY_CARE_PROVIDER_SITE_OTHER)
Admission: RE | Admit: 2016-10-16 | Discharge: 2016-10-16 | Disposition: A | Discharge status: Home / Self Care | Payer: Medicare Other | Source: Ambulatory Visit | Attending: Vascular Surgery | Admitting: Vascular Surgery

## 2016-10-16 ENCOUNTER — Encounter: Payer: Self-pay | Admitting: Family

## 2016-10-16 VITALS — BP 134/70 | HR 91 | Temp 98.0°F | Resp 20 | Ht 64.0 in | Wt 164.0 lb

## 2016-10-16 DIAGNOSIS — I739 Peripheral vascular disease, unspecified: Secondary | ICD-10-CM | POA: Diagnosis not present

## 2016-10-16 DIAGNOSIS — Z95828 Presence of other vascular implants and grafts: Secondary | ICD-10-CM

## 2016-10-16 DIAGNOSIS — I779 Disorder of arteries and arterioles, unspecified: Secondary | ICD-10-CM | POA: Diagnosis not present

## 2016-10-16 DIAGNOSIS — I96 Gangrene, not elsewhere classified: Secondary | ICD-10-CM | POA: Diagnosis not present

## 2016-10-16 DIAGNOSIS — I70221 Atherosclerosis of native arteries of extremities with rest pain, right leg: Secondary | ICD-10-CM

## 2016-10-16 DIAGNOSIS — Z87891 Personal history of nicotine dependence: Secondary | ICD-10-CM | POA: Diagnosis not present

## 2016-10-16 NOTE — Progress Notes (Signed)
[facility] [place]   CC: Follow up peripheral artery occlusive disease  History of Present Illness [name] [name] is a 66 y.o. female who had presented with a nonhealing wound of her left lower extremity. She had multilevel disease and Dr. Nanetta Batty has addressed her inflow disease on the left.  She had a left femoral to below-knee popliteal artery bypass with vein on 11/02/2015 by Dr. Edilia Bo.   Dr. Audrie Lia office is managing her right gangrenous toes and ulcer at the anterior aspect of her right lower leg. She has a unna boot type compression dressing on her right lower leg.   She returns today for follow up of the left leg bypass, and duplex of left leg was done.  The wound on her left foot has also completely healed. She has rest pain in her right lower leg and foot.   Dr. Edilia Bo last evaluated pt on 04-10-16. At that time her bypass graft was patent without any areas of restenosis noted. Dr. Edilia Bo advised follow up graft duplex in 6 months and see our nurse practitioner at that time. Her ABIs were done at Dr. Clayborne Dana office. She is on aspirin and is on a statin. She has successfully quit smoking. Her carotid Doppler studies were done at Dr. Clayborne Dana office. Her study in September 2017 showed a less than 39% left carotid stenosis with a known occlusion of the right internal carotid artery.  Pt is taking Bactrim prescribed by provider from Dr. Audrie Lia office.   Pt Diabetic: Yes Pt smoker: former smoker, quit in 2011  Pt meds include: Statin :Yes Betablocker: Yes ASA: Yes Other anticoagulants/antiplatelets: Plavix  Past Medical History:  Diagnosis Date  . Anemia 10/2015   Acute Blood Loss  . Arthritis    "feel like I have it all over" (08/28/2015)  . CHF (congestive heart failure) (HCC)   . Complication of anesthesia    DIFFICULT WAKING "only when I was smoking; no problems since I quit"  . Coronary artery disease   . Family history  of adverse reaction to anesthesia    sister slow to wake up  . GERD (gastroesophageal reflux disease)    takes Protonix daily   . Hip bursitis   . History of blood transfusion    10/2015  . Hyperlipidemia LDL goal < 70 06/28/2013   takes Atorvastatin daily  . Hypertension    takes Metoprolol and Imdur daily  . Malnutrition (HCC)   . Migraine    "none in a long time" (08/28/2015)  . Myocardial infarction (HCC) 2011  . Neuromuscular disorder (HCC)    DIABETIC NEUROPATHY  . Osteomyelitis (HCC) 2017   Left foot  . PAD (peripheral artery disease) (HCC)   . Peripheral vascular disease (HCC)   . Respiratory failure (HCC) 10/2015   Acute Hypoxia- acute pulmonary edema 11/13/2015  . Septic shock (HCC) 10/2015  . Stroke (HCC)   . Type II diabetes mellitus (HCC)    takes Lantus nightly.Average fasting blood sugar runs 80-90  Type II    Social History Social History  Substance Use Topics  . Smoking status: Former Smoker    Packs/day: 1.50    Years: 41.00    Types: Cigarettes    Quit date: 10/12/2009  . Smokeless tobacco: Never Used  . Alcohol use No    Family History Family History  Problem Relation Age of Onset  . Hypertension Mother   . Heart failure Mother   . Heart failure  Father   . Stroke Father   . Diabetes Father     Past Surgical History:  Procedure Laterality Date  . ABDOMINAL HYSTERECTOMY    . APPENDECTOMY    . ATHERECTOMY N/A 06/04/2011   Procedure: ATHERECTOMY;  Surgeon: Runell Gess, MD;  Location: [facility] CATH LAB;  Service: Cardiovascular;  Laterality: N/A;  . CARDIAC CATHETERIZATION  10/13/2009   95% stenosis in the AV groove circumflex and 95% ostial stenosis in small OM3. A 3x21mm drug-eluting Promus stent inserted ito the circumflex. Dilatated with a 3.25x70mm noncompliant Quantum balloon within entire segment. The entire region was reduced to 0% and brisk TIMI3 flow.  . CAROTID DUPLEX  03/19/2011   Right ICA-demonstrates complete occlusion. Left  ICA-demonstrates a small amount of fibrous plaque.  Marland Kitchen CATARACT EXTRACTION W/ INTRAOCULAR LENS IMPLANT Right   . CESAREAN SECTION  1990  . CORONARY ANGIOPLASTY    . ENDARTERECTOMY FEMORAL Left 09/05/2015   Procedure: ENDARTERECTOMY FEMORAL WITH PROFUNDOPLASTY;  Surgeon: Chuck Hint, MD;  Location: [facility] OR;  Service: Vascular;  Laterality: Left;  Left common femoral artery vein patch using left saphenous vien  . FEMORAL-POPLITEAL BYPASS GRAFT Left 11/02/2015   Procedure: BYPASS GRAFT FEMORAL-POPLITEAL ARTERY VS FEMORAL-TIBIAL ARTERY BYPASS;  Surgeon: Chuck Hint, MD;  Location: [facility] OR;  Service: Vascular;  Laterality: Left;  . I&D EXTREMITY Left 11/10/2015   Procedure: Debridement Left Foot Ulcer, Application  Wound VAC;  Surgeon: [name], MD;  Location: [facility] OR;  Service: Orthopedics;  Laterality: Left;  . ILIAC ARTERY STENT Left 08/28/2015   common  . INTRAOPERATIVE ARTERIOGRAM Left 09/05/2015   Procedure: INTRA OPERATIVE ARTERIOGRAM;  Surgeon: Chuck Hint, MD;  Location: [facility] OR;  Service: Vascular;  Laterality: Left;  . INTRAOPERATIVE ARTERIOGRAM Left 11/02/2015   Procedure: INTRA OPERATIVE ARTERIOGRAM;  Surgeon: Chuck Hint, MD;  Location: [facility] OR;  Service: Vascular;  Laterality: Left;  . LEXISCAN MYOVIEW  10/25/2010   Moderate perfusion defect due to infarct/scar with mild perinfarct ischemia seen in the Basal Inferolateral, Basal Anterolateral, Mid Inferolateral, and Mid Anterolateral regions. Post-stress EF is 50%.  . OVARY SURGERY  1983?   "ruptured"  . PERIPHERAL VASCULAR ANGIOGRAM  01/26/2010   High-grade SFA disease: left greater than right. Left SFA would require fem-pop bypass grafting. Right SFA could be stented but might require Diamondback Orbital atherectomy.  Marland Kitchen PERIPHERAL VASCULAR ANGIOGRAM  02/23/2010   Stealth Predator orbital rotational atherectomy performed on SFA & Popliteal up to 90,000 RPM. Stenting using overlapping 5x160mm and 5x102mm  Absolute Pro Nitinol self-expanding stents beginning just at the knee up to the mid SFA resulting in reduction of 90-95% calcified SFA & Popliteal stenosis to 0. Stenting performed on the distal common & proximal iliac artery with a 10x4 Absolute Pro- 70-0%.  Marland Kitchen PERIPHERAL VASCULAR ANGIOGRAM  06/17/2010   PTA performed to the right external iliac artery stent using a 5x100 balloon at 10 atmospheres. Stenting performed using a 6x18 Genesis on Opta balloon. Postdilatation with a 7x2 balloon resulting in a 95% "in-stent" stenosis to 0% residual.  . PERIPHERAL VASCULAR ANGIOGRAM  06/04/2011   Bilateral total SFAs not percutaneously addressable. Good canidate for femoropopliteal bypass grafting  . PERIPHERAL VASCULAR ANGIOGRAM  08/28/2015  . PERIPHERAL VASCULAR CATHETERIZATION N/A 08/28/2015   Procedure: Lower Extremity Angiography;  Surgeon: Runell Gess, MD;  Location: [facility] INVASIVE CV LAB;  Service: Cardiovascular;  Laterality: N/A;  . PERIPHERAL VASCULAR CATHETERIZATION N/A 08/28/2015   Procedure: Abdominal Aortogram;  Surgeon: Christiane Ha  Erlene Quan, MD;  Location: [facility] INVASIVE CV LAB;  Service: Cardiovascular;  Laterality: N/A;  . PERIPHERAL VASCULAR CATHETERIZATION Left 08/28/2015   Procedure: Peripheral Vascular Intervention;  Surgeon: Runell Gess, MD;  Location: [facility] INVASIVE CV LAB;  Service: Cardiovascular;  Laterality: Left;  common iliac  . PERIPHERAL VASCULAR CATHETERIZATION Left 08/28/2015   Procedure: Peripheral Vascular Atherectomy;  Surgeon: Runell Gess, MD;  Location: [facility] INVASIVE CV LAB;  Service: Cardiovascular;  Laterality: Left;  common iliac  . PERIPHERAL VASCULAR CATHETERIZATION N/A 09/28/2015   Procedure: Lower Extremity Angiography;  Surgeon: Runell Gess, MD;  Location: [facility] INVASIVE CV LAB;  Service: Cardiovascular;  Laterality: N/A;  . PERIPHERAL VASCULAR CATHETERIZATION Left 09/28/2015   Procedure: Peripheral Vascular Intervention;  Surgeon: Runell Gess, MD;  Location: [facility]  INVASIVE CV LAB;  Service: Cardiovascular;  Laterality: Left CFA  PCI with 9 mm x 4 cm Abbott nitinol absolute Pro self-expanding stent     . SKIN SPLIT GRAFT Left 12/01/2015   Procedure: LEFT FOOT SKIN GRAFT AND VAC;  Surgeon: [name], MD;  Location: [facility] OR;  Service: Orthopedics;  Laterality: Left;  . TRANSTHORACIC ECHOCARDIOGRAM  10/17/2009   EF 45-50%, moderate hypokinesis of the entire inferolateral myocardium, mild concentric hypertrophy and mild regurg of the mitral valva.  Marland Kitchen VEIN HARVEST Left 11/02/2015   Procedure: LEFT GREATER SAPHENOUS VEIN HARVEST;  Surgeon: Chuck Hint, MD;  Location: [facility] OR;  Service: Vascular;  Laterality: Left;    Allergies  Allergen Reactions  . Doxycycline Other (See Comments)    lethargy  . Hydrochlorothiazide Other (See Comments)    lethargic   . Latex Rash  . Penicillins Swelling and Rash    Pt states she has tolerated Keflex in the past without problems. States she may have tolerated Augmentin in the past but it caused GI upset. Has patient had a PCN reaction causing immediate rash, facial/tongue/throat swelling, SOB or lightheadedness with hypotension: Yes Has patient had a PCN reaction causing severe rash involving mucus membranes or skin necrosis: No Has patient had a PCN reaction that required hospitalization No Has patient had a PCN reaction occurring within the last 10 years: No    Current Outpatient Prescriptions  Medication Sig Dispense Refill  . amLODipine (NORVASC) 5 MG tablet TAKE 1 TABLET(5 MG) BY MOUTH DAILY 30 tablet 0  . aspirin EC 81 MG tablet Take 81 mg by mouth daily.    Marland Kitchen atorvastatin (LIPITOR) 80 MG tablet TAKE 1 TABLET(80 MG) BY MOUTH DAILY 90 tablet 0  . BD PEN NEEDLE NANO U/F 32G X 4 MM MISC U UTD D  5  . clopidogrel (PLAVIX) 75 MG tablet TAKE 1 TABLET(75 MG TOTAL) BY MOUTH DAILY. HOLD FOR VASCULAR SURGERY, RESTART AS SOON AS POSSIBLE AFTER LEG SURGERY(AFTER 8/8) 90 tablet 1  . furosemide (LASIX) 40 MG tablet       . gabapentin (NEURONTIN) 300 MG capsule Take 300 mg by mouth 3 (three) times daily.     . isosorbide mononitrate (IMDUR) 30 MG 24 hr tablet 30 mg. Take 1 tablet along by mouth with 60 mg to equal 90 mg daily.    . isosorbide mononitrate (IMDUR) 60 MG 24 hr tablet 60 mg. Take 1 tablet by mouth along with 30 mg to equal 90 mg daily    . LEVEMIR FLEXTOUCH 100 UNIT/ML Pen INJECT 30 UNITS INTO THE SKIN HS UTD. DISCONTINUE LANTUS  3  . losartan (COZAAR) 100 MG tablet TAKE 1 TABLET BY  MOUTH EVERY DAY 90 tablet 1  . metFORMIN (GLUCOPHAGE) 1000 MG tablet Take 1,000 mg by mouth 2 (two) times daily with a meal.    . metoprolol succinate (TOPROL-XL) 50 MG 24 hr tablet TAKE 1 AND 1/2 TABLET BY MOUTH DAILY 45 tablet 5  . ONE TOUCH ULTRA TEST test strip USE TO TEST EVERY DAY AS DIRECTED  9  . pantoprazole (PROTONIX) 40 MG tablet TAKE 1 TABLET BY MOUTH DAILY 90 tablet 3  . potassium chloride (K-DUR,KLOR-CON) 10 MEQ tablet daily.     Marland Kitchen sulfamethoxazole-trimethoprim (BACTRIM,SEPTRA) 400-80 MG tablet Take 1 tablet by mouth 2 (two) times daily.     No current facility-administered medications for this visit.     ROS: See HPI for pertinent positives and negatives.   Physical Examination  Vitals:   10/16/16 1055 10/16/16 1100  BP: 140/67 134/70  Pulse: 91   Resp: 20   Temp: 98 F (36.7 C)   TempSrc: Oral   Weight: 164 lb (74.4 kg)   Height:  (1.626 m)    Body mass index is 28.15 kg/m.  General: A&O x 3, WDWN, female. Gait: not observed Eyes: PERRLA. Pulmonary: Respirations are non labored, CTAB, good air movement Cardiac: regular Rhythm, no detected murmur.         Carotid Bruits Right Left   Positive Positive   Radial pulses are 2+ palpable bilaterally   Adominal aortic pulse is not palpable                         VASCULAR EXAM: Extremities with ischemic changes: right foot: first, second, and third toes with dry and wet gangrene, pale tissue proximal to gangrene, foul odor to  toes,  with open wound right lower leg (anterior/lateral aspect). The open wound has no erythema, no odor. Left foot with healing and contracting ulcer. See photos below.   Right foot   Right foot   Right lower leg, anterior/lateral aspect   Left foot                                                                                                             LE Pulses Right Left       FEMORAL  1+ palpable  2-3+ palpable        POPLITEAL  not palpable   not palpable       POSTERIOR TIBIAL  not palpable   palpable        DORSALIS PEDIS      ANTERIOR TIBIAL not palpable  palpable    Abdomen: soft, NT, no palpable masses. Skin: no rashes, see Extremities Musculoskeletal: no muscle wasting or atrophy.  Neurologic: A&O X 3; Appropriate Affect ; SENSATION: normal; MOTOR FUNCTION:  moving all extremities equally, motor strength 5/5 throughout. Speech is fluent/normal. CN 2-12 intact.    ASSESSMENT: [name] [name] is a 66 y.o. female who had presented with a nonhealing wound of her left lower extremity. She had multilevel disease and Dr. Nanetta Batty has  addressed her inflow disease on the left.  She had a left femoral to below-knee popliteal artery bypass with vein on 11/02/2015 by Dr. Edilia Bo.   She returns today for follow up of the left leg bypass, and duplex of left leg was done.  The wound on her left foot has also completely healed. She has rest pain in her right lower leg and foot.   Right first, second, and third toes are gangrenous and malodorous. There is a large shallow ulcer on the anterior/lateral aspect of her right lower leg, not malodorous. See above photos.   She has taken one week of a two week course of po Bactrim prescribed by a provider in Dr. Audrie Lia office.    DATA  Left LE Arterial Duplex (10/16/16): Distal bypass to native artery not adequately visualized.  No significant change compared to the exam on 04-10-16.  Patent left leg bypass with no  significant stenosis visualized.    PLAN:  Dr. Edilia Bo spoke with pt and daughter and examined pt. Dr. Edilia Bo spoke with Dr. Allyson Sabal. Dr. Allyson Sabal requested that our scheduler speak with Dr. Hazle Coca scheduler to get pt on schedule for tomorrow for arteriogram by Dr. Allyson Sabal.  Dressings applied to wounds of right lower leg and toes.   I discussed in depth with the patient the nature of atherosclerosis, and emphasized the importance of maximal medical management including strict control of blood pressure, blood glucose, and lipid levels, obtaining regular exercise, and continued cessation of smoking.  The patient is aware that without maximal medical management the underlying atherosclerotic disease process will progress, limiting the benefit of any interventions.  The patient was given information about PAD including signs, symptoms, treatment, what symptoms should prompt the patient to seek immediate medical care, and risk reduction measures to take.  Charisse March, RN, MSN, FNP-C [facility] [place] Phone: [phone]  Clinic MD: Edilia Bo  10/16/16 11:15 AM

## 2016-10-16 NOTE — Telephone Encounter (Signed)
Spoke with [name] at 1:36pm regarding Lower Extremity Angiogram scheduled for Thursday 10/17/16 @ 7:30am with Dr. Allyson Sabal.  Informed her [name] [name] will need to arrive at the Short Stay Center at [facility] at 5:00am---NPO after midnight--DO NOT TAKE METFORMIN TONIGHT OR IN THE MORNING---DO NOT TAKE FUROSEMIDE (LASIX) IN THE MORNING.  Patient ok to take all other medications with a sip of water.  [name] was also informed for patient to prepare for an over night stay tomorrow.    Lab work will be completed upon arrive at [facility] in the morning.She voiced her understanding.

## 2016-10-16 NOTE — Progress Notes (Signed)
Wet to dry gauze dressing to toes on right foot. Hydrogel dressing  to ulcer on right mid calf area. Kerlix wrap to foot and wooden shoe.

## 2016-10-16 NOTE — Telephone Encounter (Signed)
Patient called back @ 2:27pm---I gave patient instructions that were given to her daughter, Leotis Shames.  NPO after midnight----No Metformin tonight or tomorrow morning.  No furosemide (lasix) in the morning.   Arrive at [facility] at 5:00am--prepared to stay the night.  She voiced her understanding.

## 2016-10-16 NOTE — Patient Instructions (Signed)

## 2016-10-17 ENCOUNTER — Inpatient Hospital Stay (HOSPITAL_COMMUNITY): Payer: Medicare Other

## 2016-10-17 ENCOUNTER — Encounter (HOSPITAL_COMMUNITY)
Admission: RE | Disposition: A | Discharge status: Skilled Nursing Facility | Payer: Self-pay | Source: Ambulatory Visit | Attending: Vascular Surgery

## 2016-10-17 ENCOUNTER — Inpatient Hospital Stay (HOSPITAL_COMMUNITY)
Admission: RE | Admit: 2016-10-17 | Discharge: 2016-10-28 | DRG: 853 | Disposition: A | Discharge status: Skilled Nursing Facility | Payer: Medicare Other | Source: Ambulatory Visit | Attending: Vascular Surgery | Admitting: Vascular Surgery

## 2016-10-17 ENCOUNTER — Encounter (HOSPITAL_COMMUNITY): Payer: Self-pay | Admitting: Cardiovascular Disease

## 2016-10-17 DIAGNOSIS — Z961 Presence of intraocular lens: Secondary | ICD-10-CM | POA: Diagnosis present

## 2016-10-17 DIAGNOSIS — Z881 Allergy status to other antibiotic agents status: Secondary | ICD-10-CM | POA: Diagnosis not present

## 2016-10-17 DIAGNOSIS — I998 Other disorder of circulatory system: Secondary | ICD-10-CM | POA: Diagnosis not present

## 2016-10-17 DIAGNOSIS — Z7982 Long term (current) use of aspirin: Secondary | ICD-10-CM

## 2016-10-17 DIAGNOSIS — I340 Nonrheumatic mitral (valve) insufficiency: Secondary | ICD-10-CM | POA: Diagnosis not present

## 2016-10-17 DIAGNOSIS — Z01810 Encounter for preprocedural cardiovascular examination: Secondary | ICD-10-CM | POA: Diagnosis not present

## 2016-10-17 DIAGNOSIS — E1152 Type 2 diabetes mellitus with diabetic peripheral angiopathy with gangrene: Secondary | ICD-10-CM | POA: Diagnosis present

## 2016-10-17 DIAGNOSIS — I96 Gangrene, not elsewhere classified: Secondary | ICD-10-CM | POA: Diagnosis present

## 2016-10-17 DIAGNOSIS — K219 Gastro-esophageal reflux disease without esophagitis: Secondary | ICD-10-CM | POA: Diagnosis not present

## 2016-10-17 DIAGNOSIS — M79674 Pain in right toe(s): Secondary | ICD-10-CM | POA: Diagnosis not present

## 2016-10-17 DIAGNOSIS — I739 Peripheral vascular disease, unspecified: Secondary | ICD-10-CM

## 2016-10-17 DIAGNOSIS — Z87891 Personal history of nicotine dependence: Secondary | ICD-10-CM | POA: Diagnosis not present

## 2016-10-17 DIAGNOSIS — J9601 Acute respiratory failure with hypoxia: Secondary | ICD-10-CM | POA: Diagnosis present

## 2016-10-17 DIAGNOSIS — E11621 Type 2 diabetes mellitus with foot ulcer: Secondary | ICD-10-CM | POA: Diagnosis present

## 2016-10-17 DIAGNOSIS — I110 Hypertensive heart disease with heart failure: Secondary | ICD-10-CM | POA: Diagnosis not present

## 2016-10-17 DIAGNOSIS — M79609 Pain in unspecified limb: Secondary | ICD-10-CM | POA: Diagnosis not present

## 2016-10-17 DIAGNOSIS — Z419 Encounter for procedure for purposes other than remedying health state, unspecified: Secondary | ICD-10-CM

## 2016-10-17 DIAGNOSIS — R0602 Shortness of breath: Secondary | ICD-10-CM | POA: Diagnosis not present

## 2016-10-17 DIAGNOSIS — I70209 Unspecified atherosclerosis of native arteries of extremities, unspecified extremity: Secondary | ICD-10-CM | POA: Diagnosis not present

## 2016-10-17 DIAGNOSIS — A419 Sepsis, unspecified organism: Secondary | ICD-10-CM | POA: Diagnosis not present

## 2016-10-17 DIAGNOSIS — Z8673 Personal history of transient ischemic attack (TIA), and cerebral infarction without residual deficits: Secondary | ICD-10-CM | POA: Diagnosis not present

## 2016-10-17 DIAGNOSIS — Z23 Encounter for immunization: Secondary | ICD-10-CM | POA: Diagnosis not present

## 2016-10-17 DIAGNOSIS — R2689 Other abnormalities of gait and mobility: Secondary | ICD-10-CM | POA: Diagnosis not present

## 2016-10-17 DIAGNOSIS — Z91040 Latex allergy status: Secondary | ICD-10-CM | POA: Diagnosis not present

## 2016-10-17 DIAGNOSIS — E785 Hyperlipidemia, unspecified: Secondary | ICD-10-CM | POA: Diagnosis not present

## 2016-10-17 DIAGNOSIS — I2510 Atherosclerotic heart disease of native coronary artery without angina pectoris: Secondary | ICD-10-CM | POA: Diagnosis present

## 2016-10-17 DIAGNOSIS — Z880 Allergy status to penicillin: Secondary | ICD-10-CM

## 2016-10-17 DIAGNOSIS — Z7902 Long term (current) use of antithrombotics/antiplatelets: Secondary | ICD-10-CM | POA: Diagnosis not present

## 2016-10-17 DIAGNOSIS — J850 Gangrene and necrosis of lung: Secondary | ICD-10-CM | POA: Diagnosis not present

## 2016-10-17 DIAGNOSIS — I5043 Acute on chronic combined systolic (congestive) and diastolic (congestive) heart failure: Secondary | ICD-10-CM | POA: Diagnosis present

## 2016-10-17 DIAGNOSIS — R488 Other symbolic dysfunctions: Secondary | ICD-10-CM | POA: Diagnosis not present

## 2016-10-17 DIAGNOSIS — I509 Heart failure, unspecified: Secondary | ICD-10-CM | POA: Diagnosis not present

## 2016-10-17 DIAGNOSIS — D62 Acute posthemorrhagic anemia: Secondary | ICD-10-CM | POA: Diagnosis not present

## 2016-10-17 DIAGNOSIS — N170 Acute kidney failure with tubular necrosis: Secondary | ICD-10-CM | POA: Diagnosis present

## 2016-10-17 DIAGNOSIS — I70221 Atherosclerosis of native arteries of extremities with rest pain, right leg: Secondary | ICD-10-CM | POA: Diagnosis present

## 2016-10-17 DIAGNOSIS — E1140 Type 2 diabetes mellitus with diabetic neuropathy, unspecified: Secondary | ICD-10-CM | POA: Diagnosis not present

## 2016-10-17 DIAGNOSIS — I70245 Atherosclerosis of native arteries of left leg with ulceration of other part of foot: Secondary | ICD-10-CM

## 2016-10-17 DIAGNOSIS — M62262 Nontraumatic ischemic infarction of muscle, left lower leg: Secondary | ICD-10-CM | POA: Diagnosis not present

## 2016-10-17 DIAGNOSIS — L98499 Non-pressure chronic ulcer of skin of other sites with unspecified severity: Secondary | ICD-10-CM

## 2016-10-17 DIAGNOSIS — S8990XA Unspecified injury of unspecified lower leg, initial encounter: Secondary | ICD-10-CM | POA: Diagnosis not present

## 2016-10-17 DIAGNOSIS — Z9841 Cataract extraction status, right eye: Secondary | ICD-10-CM

## 2016-10-17 DIAGNOSIS — I252 Old myocardial infarction: Secondary | ICD-10-CM

## 2016-10-17 DIAGNOSIS — Z794 Long term (current) use of insulin: Secondary | ICD-10-CM

## 2016-10-17 DIAGNOSIS — J681 Pulmonary edema due to chemicals, gases, fumes and vapors: Secondary | ICD-10-CM | POA: Diagnosis not present

## 2016-10-17 DIAGNOSIS — E1159 Type 2 diabetes mellitus with other circulatory complications: Secondary | ICD-10-CM | POA: Diagnosis not present

## 2016-10-17 DIAGNOSIS — M6281 Muscle weakness (generalized): Secondary | ICD-10-CM | POA: Diagnosis not present

## 2016-10-17 DIAGNOSIS — Z79899 Other long term (current) drug therapy: Secondary | ICD-10-CM

## 2016-10-17 DIAGNOSIS — I10 Essential (primary) hypertension: Secondary | ICD-10-CM | POA: Diagnosis not present

## 2016-10-17 DIAGNOSIS — R2681 Unsteadiness on feet: Secondary | ICD-10-CM | POA: Diagnosis not present

## 2016-10-17 DIAGNOSIS — I999 Unspecified disorder of circulatory system: Secondary | ICD-10-CM | POA: Diagnosis not present

## 2016-10-17 DIAGNOSIS — J9691 Respiratory failure, unspecified with hypoxia: Secondary | ICD-10-CM | POA: Diagnosis not present

## 2016-10-17 DIAGNOSIS — Z4781 Encounter for orthopedic aftercare following surgical amputation: Secondary | ICD-10-CM | POA: Diagnosis not present

## 2016-10-17 DIAGNOSIS — J80 Acute respiratory distress syndrome: Secondary | ICD-10-CM | POA: Diagnosis not present

## 2016-10-17 DIAGNOSIS — E119 Type 2 diabetes mellitus without complications: Secondary | ICD-10-CM | POA: Diagnosis not present

## 2016-10-17 DIAGNOSIS — Z89421 Acquired absence of other right toe(s): Secondary | ICD-10-CM | POA: Diagnosis not present

## 2016-10-17 DIAGNOSIS — E875 Hyperkalemia: Secondary | ICD-10-CM | POA: Diagnosis not present

## 2016-10-17 DIAGNOSIS — J9690 Respiratory failure, unspecified, unspecified whether with hypoxia or hypercapnia: Secondary | ICD-10-CM

## 2016-10-17 DIAGNOSIS — Z955 Presence of coronary angioplasty implant and graft: Secondary | ICD-10-CM

## 2016-10-17 HISTORY — DX: Gangrene, not elsewhere classified: I96

## 2016-10-17 HISTORY — DX: Sepsis, unspecified organism: A41.9

## 2016-10-17 HISTORY — PX: LOWER EXTREMITY ANGIOGRAPHY: CATH118251

## 2016-10-17 LAB — CBC
HEMATOCRIT: 28 % — AB (ref 36.0–46.0)
Hemoglobin: 9.5 g/dL — ABNORMAL LOW (ref 12.0–15.0)
MCH: 28.9 pg (ref 26.0–34.0)
MCHC: 33.9 g/dL (ref 30.0–36.0)
MCV: 85.1 fL (ref 78.0–100.0)
PLATELETS: 337 10*3/uL (ref 150–400)
RBC: 3.29 MIL/uL — AB (ref 3.87–5.11)
RDW: 13.4 % (ref 11.5–15.5)
WBC: 18.4 10*3/uL — AB (ref 4.0–10.5)

## 2016-10-17 LAB — CBC WITH DIFFERENTIAL/PLATELET
Basophils Absolute: 0 10*3/uL (ref 0.0–0.1)
Basophils Relative: 0 %
EOS ABS: 0.4 10*3/uL (ref 0.0–0.7)
Eosinophils Relative: 2 %
HEMATOCRIT: 31.5 % — AB (ref 36.0–46.0)
HEMOGLOBIN: 10.5 g/dL — AB (ref 12.0–15.0)
LYMPHS ABS: 2.4 10*3/uL (ref 0.7–4.0)
LYMPHS PCT: 11 %
MCH: 28.7 pg (ref 26.0–34.0)
MCHC: 33.3 g/dL (ref 30.0–36.0)
MCV: 86.1 fL (ref 78.0–100.0)
MONOS PCT: 8 %
Monocytes Absolute: 1.6 10*3/uL — ABNORMAL HIGH (ref 0.1–1.0)
NEUTROS ABS: 16.7 10*3/uL — AB (ref 1.7–7.7)
NEUTROS PCT: 79 %
Platelets: 429 10*3/uL — ABNORMAL HIGH (ref 150–400)
RBC: 3.66 MIL/uL — AB (ref 3.87–5.11)
RDW: 13.5 % (ref 11.5–15.5)
WBC: 21.1 10*3/uL — AB (ref 4.0–10.5)

## 2016-10-17 LAB — BASIC METABOLIC PANEL
ANION GAP: 11 (ref 5–15)
BUN: 27 mg/dL — ABNORMAL HIGH (ref 6–20)
CALCIUM: 9 mg/dL (ref 8.9–10.3)
CO2: 20 mmol/L — ABNORMAL LOW (ref 22–32)
Chloride: 107 mmol/L (ref 101–111)
Creatinine, Ser: 1.3 mg/dL — ABNORMAL HIGH (ref 0.44–1.00)
GFR, EST AFRICAN AMERICAN: 48 mL/min — AB (ref >60)
GFR, EST NON AFRICAN AMERICAN: 42 mL/min — AB (ref >60)
Glucose, Bld: 82 mg/dL (ref 65–99)
POTASSIUM: 4.9 mmol/L (ref 3.5–5.1)
Sodium: 138 mmol/L (ref 135–145)

## 2016-10-17 LAB — LACTIC ACID, PLASMA: LACTIC ACID, VENOUS: 1.4 mmol/L (ref 0.5–1.9)

## 2016-10-17 LAB — GLUCOSE, CAPILLARY
GLUCOSE-CAPILLARY: 84 mg/dL (ref 65–99)
GLUCOSE-CAPILLARY: 80 mg/dL (ref 65–99)
GLUCOSE-CAPILLARY: 126 mg/dL — AB (ref 65–99)
Glucose-Capillary: 123 mg/dL — ABNORMAL HIGH (ref 65–99)

## 2016-10-17 LAB — PROTIME-INR
INR: 1.23
Prothrombin Time: 15.4 seconds — ABNORMAL HIGH (ref 11.4–15.2)

## 2016-10-17 SURGERY — LOWER EXTREMITY ANGIOGRAPHY
Anesthesia: LOCAL

## 2016-10-17 MED ORDER — ONDANSETRON HCL 4 MG/2ML IJ SOLN
4.0000 mg | Freq: Four times a day (QID) | INTRAMUSCULAR | Status: DC | PRN
  Administered 2016-10-21 – 2016-10-27 (×3): 4 mg via INTRAVENOUS
  Filled 2016-10-17 (×3): qty 2

## 2016-10-17 MED ORDER — SODIUM CHLORIDE 0.9 % IV SOLN: 250.0000 mL | INTRAVENOUS | Status: DC | PRN

## 2016-10-17 MED ORDER — MIDAZOLAM HCL 2 MG/2ML IJ SOLN
INTRAMUSCULAR | Status: AC
  Filled 2016-10-17: qty 2

## 2016-10-17 MED ORDER — METHYLPREDNISOLONE SODIUM SUCC 125 MG IJ SOLR
INTRAMUSCULAR | Status: AC
  Administered 2016-10-17: 60 mg
  Filled 2016-10-17: qty 2

## 2016-10-17 MED ORDER — ACETAMINOPHEN 325 MG PO TABS: 650.0000 mg | ORAL_TABLET | ORAL | Status: DC | PRN

## 2016-10-17 MED ORDER — ASPIRIN 81 MG PO CHEW
81.0000 mg | CHEWABLE_TABLET | ORAL | Status: AC
  Administered 2016-10-17: 81 mg via ORAL

## 2016-10-17 MED ORDER — CLOPIDOGREL BISULFATE 75 MG PO TABS
75.0000 mg | ORAL_TABLET | Freq: Once | ORAL | Status: AC
  Administered 2016-10-17: 75 mg via ORAL

## 2016-10-17 MED ORDER — SODIUM CHLORIDE 0.9% FLUSH: 3.0000 mL | Freq: Two times a day (BID) | INTRAVENOUS | Status: DC

## 2016-10-17 MED ORDER — LIDOCAINE HCL 2 % IJ SOLN
INTRAMUSCULAR | Status: AC
Start: 2016-10-17 — End: 2016-10-17
  Filled 2016-10-17: qty 10

## 2016-10-17 MED ORDER — ACETAMINOPHEN 650 MG RE SUPP
650.0000 mg | Freq: Once | RECTAL | Status: AC
  Administered 2016-10-17: 650 mg via RECTAL
  Filled 2016-10-17: qty 1

## 2016-10-17 MED ORDER — SODIUM CHLORIDE 0.9 % WEIGHT BASED INFUSION
3.0000 mL/kg/h | INTRAVENOUS | Status: DC
  Administered 2016-10-17: 3 mL/kg/h via INTRAVENOUS

## 2016-10-17 MED ORDER — LIDOCAINE HCL (PF) 1 % IJ SOLN
INTRAMUSCULAR | Status: DC | PRN
  Administered 2016-10-17: 20 mL

## 2016-10-17 MED ORDER — MIDAZOLAM HCL 2 MG/2ML IJ SOLN
INTRAMUSCULAR | Status: DC | PRN
Start: 2016-10-17 — End: 2016-10-17
  Administered 2016-10-17: 1 mg via INTRAVENOUS

## 2016-10-17 MED ORDER — HEPARIN (PORCINE) IN NACL 2-0.9 UNIT/ML-% IJ SOLN
INTRAMUSCULAR | Status: AC | PRN
  Administered 2016-10-17: 1000 mL via INTRA_ARTERIAL

## 2016-10-17 MED ORDER — FENTANYL CITRATE (PF) 100 MCG/2ML IJ SOLN
INTRAMUSCULAR | Status: AC
  Filled 2016-10-17: qty 2

## 2016-10-17 MED ORDER — DEXTROSE 5 % IV SOLN
2.0000 g | Freq: Once | INTRAVENOUS | Status: AC
  Administered 2016-10-18: 2 g via INTRAVENOUS
  Filled 2016-10-17: qty 2

## 2016-10-17 MED ORDER — VANCOMYCIN HCL IN DEXTROSE 750-5 MG/150ML-% IV SOLN
750.0000 mg | Freq: Two times a day (BID) | INTRAVENOUS | Status: DC
  Filled 2016-10-17: qty 150

## 2016-10-17 MED ORDER — FUROSEMIDE 40 MG PO TABS
40.0000 mg | ORAL_TABLET | Freq: Once | ORAL | Status: AC
  Administered 2016-10-17: 40 mg via ORAL
  Filled 2016-10-17: qty 1

## 2016-10-17 MED ORDER — ASPIRIN 81 MG PO CHEW
81.0000 mg | CHEWABLE_TABLET | Freq: Every day | ORAL | Status: DC
  Administered 2016-10-18 – 2016-10-28 (×10): 81 mg via ORAL
  Filled 2016-10-17 (×10): qty 1

## 2016-10-17 MED ORDER — CLOPIDOGREL BISULFATE 75 MG PO TABS
ORAL_TABLET | ORAL | Status: AC
  Administered 2016-10-17: 75 mg via ORAL
  Filled 2016-10-17: qty 1

## 2016-10-17 MED ORDER — FUROSEMIDE 10 MG/ML IJ SOLN
40.0000 mg | Freq: Once | INTRAMUSCULAR | Status: AC
  Administered 2016-10-17: 40 mg via INTRAVENOUS

## 2016-10-17 MED ORDER — METHYLPREDNISOLONE SODIUM SUCC 125 MG IJ SOLR: 60.0000 mg | INTRAMUSCULAR | Status: AC

## 2016-10-17 MED ORDER — VANCOMYCIN HCL IN DEXTROSE 1-5 GM/200ML-% IV SOLN
1000.0000 mg | Freq: Two times a day (BID) | INTRAVENOUS | Status: DC
  Administered 2016-10-18: 1000 mg via INTRAVENOUS
  Filled 2016-10-17 (×3): qty 200

## 2016-10-17 MED ORDER — FENTANYL CITRATE (PF) 100 MCG/2ML IJ SOLN
INTRAMUSCULAR | Status: DC | PRN
  Administered 2016-10-17: 25 ug via INTRAVENOUS

## 2016-10-17 MED ORDER — SODIUM CHLORIDE 0.9% FLUSH
3.0000 mL | Freq: Two times a day (BID) | INTRAVENOUS | Status: DC
  Administered 2016-10-17 – 2016-10-27 (×18): 3 mL via INTRAVENOUS

## 2016-10-17 MED ORDER — LEVALBUTEROL HCL 0.63 MG/3ML IN NEBU
0.6300 mg | INHALATION_SOLUTION | Freq: Four times a day (QID) | RESPIRATORY_TRACT | Status: DC | PRN
  Administered 2016-10-17 – 2016-10-26 (×2): 0.63 mg via RESPIRATORY_TRACT
  Filled 2016-10-17 (×2): qty 3

## 2016-10-17 MED ORDER — HEPARIN (PORCINE) IN NACL 2-0.9 UNIT/ML-% IJ SOLN
INTRAMUSCULAR | Status: AC
  Filled 2016-10-17: qty 1000

## 2016-10-17 MED ORDER — HYDRALAZINE HCL 20 MG/ML IJ SOLN: 5.0000 mg | INTRAMUSCULAR | Status: AC | PRN

## 2016-10-17 MED ORDER — IODIXANOL 320 MG/ML IV SOLN
INTRAVENOUS | Status: DC | PRN
  Administered 2016-10-17: 80 mL via INTRA_ARTERIAL

## 2016-10-17 MED ORDER — SODIUM CHLORIDE 0.9 % WEIGHT BASED INFUSION: 1.0000 mL/kg/h | INTRAVENOUS | Status: DC

## 2016-10-17 MED ORDER — INSULIN ASPART 100 UNIT/ML ~~LOC~~ SOLN
0.0000 [IU] | Freq: Three times a day (TID) | SUBCUTANEOUS | Status: DC
Start: 2016-10-17 — End: 2016-10-18
  Administered 2016-10-17: 2 [IU] via SUBCUTANEOUS
  Administered 2016-10-18: 8 [IU] via SUBCUTANEOUS

## 2016-10-17 MED ORDER — LIDOCAINE HCL 2 % IJ SOLN
INTRAMUSCULAR | Status: AC
  Filled 2016-10-17: qty 10

## 2016-10-17 MED ORDER — VANCOMYCIN HCL 10 G IV SOLR
1500.0000 mg | Freq: Once | INTRAVENOUS | Status: DC
  Filled 2016-10-17: qty 1500

## 2016-10-17 MED ORDER — IPRATROPIUM BROMIDE 0.02 % IN SOLN
0.5000 mg | Freq: Four times a day (QID) | RESPIRATORY_TRACT | Status: DC | PRN
  Administered 2016-10-17: 0.5 mg via RESPIRATORY_TRACT
  Filled 2016-10-17: qty 2.5

## 2016-10-17 MED ORDER — MORPHINE SULFATE (PF) 10 MG/ML IV SOLN: 2.0000 mg | INTRAVENOUS | Status: DC | PRN

## 2016-10-17 MED ORDER — MORPHINE SULFATE (PF) 2 MG/ML IV SOLN
2.0000 mg | INTRAVENOUS | Status: DC | PRN
  Administered 2016-10-17 – 2016-10-24 (×28): 2 mg via INTRAVENOUS
  Filled 2016-10-17 (×27): qty 1

## 2016-10-17 MED ORDER — SODIUM CHLORIDE 0.9% FLUSH: 3.0000 mL | INTRAVENOUS | Status: DC | PRN

## 2016-10-17 MED ORDER — ASPIRIN 81 MG PO CHEW
CHEWABLE_TABLET | ORAL | Status: AC
Start: 2016-10-17 — End: 2016-10-17
  Administered 2016-10-17: 81 mg via ORAL
  Filled 2016-10-17: qty 1

## 2016-10-17 MED ORDER — ISOSORBIDE MONONITRATE ER 60 MG PO TB24
90.0000 mg | ORAL_TABLET | Freq: Every day | ORAL | Status: DC
  Administered 2016-10-17 – 2016-10-27 (×11): 90 mg via ORAL
  Filled 2016-10-17 (×12): qty 1

## 2016-10-17 MED ORDER — SODIUM CHLORIDE 0.9% FLUSH
3.0000 mL | INTRAVENOUS | Status: DC | PRN
  Administered 2016-10-18: 3 mL via INTRAVENOUS
  Filled 2016-10-17: qty 3

## 2016-10-17 MED ORDER — VANCOMYCIN HCL IN DEXTROSE 1-5 GM/200ML-% IV SOLN
1000.0000 mg | Freq: Two times a day (BID) | INTRAVENOUS | Status: DC
  Filled 2016-10-17: qty 200

## 2016-10-17 MED ORDER — LABETALOL HCL 5 MG/ML IV SOLN: 10.0000 mg | INTRAVENOUS | Status: AC | PRN

## 2016-10-17 MED ORDER — SODIUM CHLORIDE 0.9 % IV SOLN: INTRAVENOUS | Status: AC

## 2016-10-17 MED ORDER — SODIUM CHLORIDE 0.9 % IV SOLN
1500.0000 mg | Freq: Once | INTRAVENOUS | Status: AC
  Administered 2016-10-17: 1500 mg via INTRAVENOUS
  Filled 2016-10-17: qty 1500

## 2016-10-17 SURGICAL SUPPLY — 11 items
CATH ANGIO 5F PIGTAIL 65CM (CATHETERS) ×1 IMPLANT
CATH CROSS OVER TEMPO 5F (CATHETERS) ×1 IMPLANT
CATH STRAIGHT 5FR 65CM (CATHETERS) ×1 IMPLANT
KIT PV (KITS) ×2 IMPLANT
SHEATH PINNACLE 5F 10CM (SHEATH) ×1 IMPLANT
STOPCOCK MORSE 400PSI 3WAY (MISCELLANEOUS) ×1 IMPLANT
SYRINGE MEDRAD AVANTA MACH 7 (SYRINGE) ×1 IMPLANT
TRANSDUCER W/STOPCOCK (MISCELLANEOUS) ×2 IMPLANT
TRAY PV CATH (CUSTOM PROCEDURE TRAY) ×2 IMPLANT
TUBING CIL FLEX 10 FLL-RA (TUBING) ×1 IMPLANT
WIRE HITORQ VERSACORE ST 145CM (WIRE) ×1 IMPLANT

## 2016-10-17 NOTE — Significant Event (Addendum)
Rapid Response Event Note  Overview: Time Called: 2114 Arrival Time: 2115 Event Type: Respiratory  Initial Focused Assessment: Called by bedside RN to assess patient for low oxygen saturations.  RN had already called RT, patient was placed on 15LNRB by RT, prior to this patient was on 4L nasal cannula which has been gradually increased to 10L [place], and then 15L NRB.  Patient was alert, anxious, increased work of breathing when talking but RR in the low 20s when calm and resting.  RN did administer pain medication for acute pain in right leg which per patient has helped her breathing.  I spoke with RT, I gradually attempted to wean patient's oxygen level, I switched patient to Venti Mask 12L 50%, her sats maintained and improved. I asked RT to give patient a Xopenox/Atrovent and asked primary RN to page VVS MD on call, update him and see if there were any other interventions.  Patient was given IS, patient was able to 500-750 on IS.  I was called away to another patient, when I came back, TRH MD was at bedside, sats were in the upper 70s - low 80s, RR in the 40s, patient was more anxious, mild agitation, significant increased work of breathing, + wheezing.  TRH MD ordered labs, ABG, and BIPAP was started at 2250.  TRH also asked for CCM MD, I called Pola Corn MD, asked if rounding team could come, rounding team arrived at bedside.  CCM MD ordered 60mg  IV Solu-Medrol and lasix 40mg  IV, transfer to ICU.  Interventions: - Incentive Spirometer - Xopenox/Atrovent - labs, BIPAP, will get ABG after 1hr on BIPAP - Lasix 40mg  IV and Solu Medrol 60mg  IV  Plan of Care (if not transferred): - Transfer 53M MICU  Event Summary: Name of Physician Notified: Dr. Randie Heinz by Primary RN at 2140  Name of Consulting Physician Notified: Dr. Julian Reil and Dr. Mauri Pole  at 2253, Dr. Julian Reil to call Dr. Randie Heinz  Outcome: Transferred (Comment)  Event End Time: 0015  Demetri Goshert R

## 2016-10-17 NOTE — H&P (View-Only) (Signed)
[facility]   CC: Follow up peripheral artery occlusive disease  History of Present Illness [name] [name] is a 66 y.o. female who had presented with a nonhealing wound of her left lower extremity. She had multilevel disease and Dr. Jonathan Berry has addressed her inflow disease on the left.  She had a left femoral to below-knee popliteal artery bypass with vein on 11/02/2015 by Dr. Dickson.   Dr. Duda's office is managing her right gangrenous toes and ulcer at the anterior aspect of her right lower leg. She has a unna boot type compression dressing on her right lower leg.   She returns today for follow up of the left leg bypass, and duplex of left leg was done.  The wound on her left foot has also completely healed. She has rest pain in her right lower leg and foot.   Dr. Dickson last evaluated pt on 04-10-16. At that time her bypass graft was patent without any areas of restenosis noted. Dr. Dickson advised follow up graft duplex in 6 months and see our nurse practitioner at that time. Her ABIs were done at Dr. Jonathan Berry's office. She is on aspirin and is on a statin. She has successfully quit smoking. Her carotid Doppler studies were done at Dr. Jonathan Berry's office. Her study in September 2017 showed a less than 39% left carotid stenosis with a known occlusion of the right internal carotid artery.  Pt is taking Bactrim prescribed by provider from Dr. Duda's office.   Pt Diabetic: Yes Pt smoker: former smoker, quit in 2011  Pt meds include: Statin :Yes Betablocker: Yes ASA: Yes Other anticoagulants/antiplatelets: Plavix  Past Medical History:  Diagnosis Date  . Anemia 10/2015   Acute Blood Loss  . Arthritis    "feel like I have it all over" (08/28/2015)  . CHF (congestive heart failure) (HCC)   . Complication of anesthesia    DIFFICULT WAKING "only when I was smoking; no problems since I quit"  . Coronary artery disease   . Family history  of adverse reaction to anesthesia    sister slow to wake up  . GERD (gastroesophageal reflux disease)    takes Protonix daily   . Hip bursitis   . History of blood transfusion    10/2015  . Hyperlipidemia LDL goal < 70 06/28/2013   takes Atorvastatin daily  . Hypertension    takes Metoprolol and Imdur daily  . Malnutrition (HCC)   . Migraine    "none in a long time" (08/28/2015)  . Myocardial infarction (HCC) 2011  . Neuromuscular disorder (HCC)    DIABETIC NEUROPATHY  . Osteomyelitis (HCC) 2017   Left foot  . PAD (peripheral artery disease) (HCC)   . Peripheral vascular disease (HCC)   . Respiratory failure (HCC) 10/2015   Acute Hypoxia- acute pulmonary edema 11/13/2015  . Septic shock (HCC) 10/2015  . Stroke (HCC)   . Type II diabetes mellitus (HCC)    takes Lantus nightly.Average fasting blood sugar runs 80-90  Type II    Social History Social History  Substance Use Topics  . Smoking status: Former Smoker    Packs/day: 1.50    Years: 41.00    Types: Cigarettes    Quit date: 10/12/2009  . Smokeless tobacco: Never Used  . Alcohol use No    Family History Family History  Problem Relation Age of Onset  . Hypertension Mother   . Heart failure Mother   . Heart failure   Father   . Stroke Father   . Diabetes Father     Past Surgical History:  Procedure Laterality Date  . ABDOMINAL HYSTERECTOMY    . APPENDECTOMY    . ATHERECTOMY N/A 06/04/2011   Procedure: ATHERECTOMY;  Surgeon: Jonathan J Berry, MD;  Location: [facility];  Service: Cardiovascular;  Laterality: N/A;  . CARDIAC CATHETERIZATION  10/13/2009   95% stenosis in the AV groove circumflex and 95% ostial stenosis in small OM3. A 3x28mm drug-eluting Promus stent inserted ito the circumflex. Dilatated with a 3.25x12mm noncompliant Quantum balloon within entire segment. The entire region was reduced to 0% and brisk TIMI3 flow.  . CAROTID DUPLEX  03/19/2011   Right ICA-demonstrates complete occlusion. Left  ICA-demonstrates a small amount of fibrous plaque.  . CATARACT EXTRACTION W/ INTRAOCULAR LENS IMPLANT Right   . CESAREAN SECTION  1990  . CORONARY ANGIOPLASTY    . ENDARTERECTOMY FEMORAL Left 09/05/2015   Procedure: ENDARTERECTOMY FEMORAL WITH PROFUNDOPLASTY;  Surgeon: Christopher S Dickson, MD;  Location: [facility] OR;  Service: Vascular;  Laterality: Left;  Left common femoral artery vein patch using left saphenous vien  . FEMORAL-POPLITEAL BYPASS GRAFT Left 11/02/2015   Procedure: BYPASS GRAFT FEMORAL-POPLITEAL ARTERY VS FEMORAL-TIBIAL ARTERY BYPASS;  Surgeon: Christopher S Dickson, MD;  Location: [facility];  Service: Vascular;  Laterality: Left;  . I&D EXTREMITY Left 11/10/2015   Procedure: Debridement Left Foot Ulcer, Application  Wound VAC;  Surgeon: Marcus V Duda, MD;  Location: [facility] OR;  Service: Orthopedics;  Laterality: Left;  . ILIAC ARTERY STENT Left 08/28/2015   common  . INTRAOPERATIVE ARTERIOGRAM Left 09/05/2015   Procedure: INTRA OPERATIVE ARTERIOGRAM;  Surgeon: Christopher S Dickson, MD;  Location: [facility];  Service: Vascular;  Laterality: Left;  . INTRAOPERATIVE ARTERIOGRAM Left 11/02/2015   Procedure: INTRA OPERATIVE ARTERIOGRAM;  Surgeon: Christopher S Dickson, MD;  Location: [facility];  Service: Vascular;  Laterality: Left;  . LEXISCAN MYOVIEW  10/25/2010   Moderate perfusion defect due to infarct/scar with mild perinfarct ischemia seen in the Basal Inferolateral, Basal Anterolateral, Mid Inferolateral, and Mid Anterolateral regions. Post-stress EF is 50%.  . OVARY SURGERY  1983?   "ruptured"  . PERIPHERAL VASCULAR ANGIOGRAM  01/26/2010   High-grade SFA disease: left greater than right. Left SFA would require fem-pop bypass grafting. Right SFA could be stented but might require Diamondback Orbital atherectomy.  . PERIPHERAL VASCULAR ANGIOGRAM  02/23/2010   Stealth Predator orbital rotational atherectomy performed on SFA & Popliteal up to 90,000 RPM. Stenting using overlapping 5x100mm and 5x60mm  Absolute Pro Nitinol self-expanding stents beginning just at the knee up to the mid SFA resulting in reduction of 90-95% calcified SFA & Popliteal stenosis to 0. Stenting performed on the distal common & proximal iliac artery with a 10x4 Absolute Pro- 70-0%.  . PERIPHERAL VASCULAR ANGIOGRAM  06/17/2010   PTA performed to the right external iliac artery stent using a 5x100 balloon at 10 atmospheres. Stenting performed using a 6x18 Genesis on Opta balloon. Postdilatation with a 7x2 balloon resulting in a 95% "in-stent" stenosis to 0% residual.  . PERIPHERAL VASCULAR ANGIOGRAM  06/04/2011   Bilateral total SFAs not percutaneously addressable. Good canidate for femoropopliteal bypass grafting  . PERIPHERAL VASCULAR ANGIOGRAM  08/28/2015  . PERIPHERAL VASCULAR CATHETERIZATION N/A 08/28/2015   Procedure: Lower Extremity Angiography;  Surgeon: Jonathan J Berry, MD;  Location: [facility];  Service: Cardiovascular;  Laterality: N/A;  . PERIPHERAL VASCULAR CATHETERIZATION N/A 08/28/2015   Procedure: Abdominal Aortogram;  Surgeon: Jonathan   J Berry, MD;  Location: [facility];  Service: Cardiovascular;  Laterality: N/A;  . PERIPHERAL VASCULAR CATHETERIZATION Left 08/28/2015   Procedure: Peripheral Vascular Intervention;  Surgeon: Jonathan J Berry, MD;  [facility]: [facility];  Service: Cardiovascular;  Laterality: Left;  common iliac  . PERIPHERAL VASCULAR CATHETERIZATION Left 08/28/2015   Procedure: Peripheral Vascular Atherectomy;  Surgeon: Jonathan J Berry, MD;  Location: [facility];  Service: Cardiovascular;  Laterality: Left;  common iliac  . PERIPHERAL VASCULAR CATHETERIZATION N/A 09/28/2015   Procedure: Lower Extremity Angiography;  Surgeon: Jonathan J Berry, MD;  [facility]: [facility];  Service: Cardiovascular;  Laterality: N/A;  . PERIPHERAL VASCULAR CATHETERIZATION Left 09/28/2015   Procedure: Peripheral Vascular Intervention;  Surgeon: Jonathan J Berry, MD;  Location: [facility];  Service: Cardiovascular;  Laterality: Left CFA  PCI with 9 mm x 4 cm Abbott nitinol absolute Pro self-expanding stent     . SKIN SPLIT GRAFT Left 12/01/2015   Procedure: LEFT FOOT SKIN GRAFT AND VAC;  Surgeon: Marcus V Duda, MD;  Location: [facility];  Service: Orthopedics;  Laterality: Left;  . TRANSTHORACIC ECHOCARDIOGRAM  10/17/2009   EF 45-50%, moderate hypokinesis of the entire inferolateral myocardium, mild concentric hypertrophy and mild regurg of the mitral valva.  . VEIN HARVEST Left 11/02/2015   Procedure: LEFT GREATER SAPHENOUS VEIN HARVEST;  Surgeon: Christopher S Dickson, MD;  Location: [facility];  Service: Vascular;  Laterality: Left;    Allergies  Allergen Reactions  . Doxycycline Other (See Comments)    lethargy  . Hydrochlorothiazide Other (See Comments)    lethargic   . Latex Rash  . Penicillins Swelling and Rash    Pt states she has tolerated Keflex in the past without problems. States she may have tolerated Augmentin in the past but it caused GI upset. Has patient had a PCN reaction causing immediate rash, facial/tongue/throat swelling, SOB or lightheadedness with hypotension: Yes Has patient had a PCN reaction causing severe rash involving mucus membranes or skin necrosis: No Has patient had a PCN reaction that required hospitalization No Has patient had a PCN reaction occurring within the last 10 years: No    Current Outpatient Prescriptions  Medication Sig Dispense Refill  . amLODipine (NORVASC) 5 MG tablet TAKE 1 TABLET(5 MG) BY MOUTH DAILY 30 tablet 0  . aspirin EC 81 MG tablet Take 81 mg by mouth daily.    . atorvastatin (LIPITOR) 80 MG tablet TAKE 1 TABLET(80 MG) BY MOUTH DAILY 90 tablet 0  . BD PEN NEEDLE NANO U/F 32G X 4 MM MISC U UTD D  5  . clopidogrel (PLAVIX) 75 MG tablet TAKE 1 TABLET(75 MG TOTAL) BY MOUTH DAILY. HOLD FOR VASCULAR SURGERY, RESTART AS SOON AS POSSIBLE AFTER LEG SURGERY(AFTER 8/8) 90 tablet 1  . furosemide (LASIX) 40 MG tablet       . gabapentin (NEURONTIN) 300 MG capsule Take 300 mg by mouth 3 (three) times daily.     . isosorbide mononitrate (IMDUR) 30 MG 24 hr tablet 30 mg. Take 1 tablet along by mouth with 60 mg to equal 90 mg daily.    . isosorbide mononitrate (IMDUR) 60 MG 24 hr tablet 60 mg. Take 1 tablet by mouth along with 30 mg to equal 90 mg daily    . LEVEMIR FLEXTOUCH 100 UNIT/ML Pen INJECT 30 UNITS INTO THE SKIN HS UTD. DISCONTINUE LANTUS  3  . losartan (COZAAR) 100 MG tablet TAKE 1 TABLET BY   MOUTH EVERY DAY 90 tablet 1  . metFORMIN (GLUCOPHAGE) 1000 MG tablet Take 1,000 mg by mouth 2 (two) times daily with a meal.    . metoprolol succinate (TOPROL-XL) 50 MG 24 hr tablet TAKE 1 AND 1/2 TABLET BY MOUTH DAILY 45 tablet 5  . ONE TOUCH ULTRA TEST test strip USE TO TEST EVERY DAY AS DIRECTED  9  . pantoprazole (PROTONIX) 40 MG tablet TAKE 1 TABLET BY MOUTH DAILY 90 tablet 3  . potassium chloride (K-DUR,KLOR-CON) 10 MEQ tablet daily.     . sulfamethoxazole-trimethoprim (BACTRIM,SEPTRA) 400-80 MG tablet Take 1 tablet by mouth 2 (two) times daily.     No current facility-administered medications for this visit.     ROS: See HPI for pertinent positives and negatives.   Physical Examination  Vitals:   10/16/16 1055 10/16/16 1100  BP: 140/67 134/70  Pulse: 91   Resp: 20   Temp: 98 F (36.7 C)   TempSrc: Oral   Weight: 164 lb (74.4 kg)   Height: 5' 4" (1.626 m)    Body mass index is 28.15 kg/m.  General: A&O x 3, WDWN, female. Gait: not observed Eyes: PERRLA. Pulmonary: Respirations are non labored, CTAB, good air movement Cardiac: regular Rhythm, no detected murmur.         Carotid Bruits Right Left   Positive Positive   Radial pulses are 2+ palpable bilaterally   Adominal aortic pulse is not palpable                         VASCULAR EXAM: Extremities with ischemic changes: right foot: first, second, and third toes with dry and wet gangrene, pale tissue proximal to gangrene, foul odor to  toes,  with open wound right lower leg (anterior/lateral aspect). The open wound has no erythema, no odor. Left foot with healing and contracting ulcer. See photos below.   Right foot   Right foot   Right lower leg, anterior/lateral aspect   Left foot                                                                                                             LE Pulses Right Left       FEMORAL  1+ palpable  2-3+ palpable        POPLITEAL  not palpable   not palpable       POSTERIOR TIBIAL  not palpable   palpable        DORSALIS PEDIS      ANTERIOR TIBIAL not palpable  palpable    Abdomen: soft, NT, no palpable masses. Skin: no rashes, see Extremities Musculoskeletal: no muscle wasting or atrophy.  Neurologic: A&O X 3; Appropriate Affect ; SENSATION: normal; MOTOR FUNCTION:  moving all extremities equally, motor strength 5/5 throughout. Speech is fluent/normal. CN 2-12 intact.    ASSESSMENT: [name] [name] is a 66 y.o. female who had presented with a nonhealing wound of her left lower extremity. She had multilevel disease and Dr. Jonathan Berry has   addressed her inflow disease on the left.  She had a left femoral to below-knee popliteal artery bypass with vein on 11/02/2015 by Dr. Dickson.   She returns today for follow up of the left leg bypass, and duplex of left leg was done.  The wound on her left foot has also completely healed. She has rest pain in her right lower leg and foot.   Right first, second, and third toes are gangrenous and malodorous. There is a large shallow ulcer on the anterior/lateral aspect of her right lower leg, not malodorous. See above photos.   She has taken one week of a two week course of po Bactrim prescribed by a provider in Dr. Duda's office.    DATA  Left LE Arterial Duplex (10/16/16): Distal bypass to native artery not adequately visualized.  No significant change compared to the exam on 04-10-16.  Patent left leg bypass with no  significant stenosis visualized.    PLAN:  Dr. Dickson spoke with pt and daughter and examined pt. Dr. Dickson spoke with Dr. Berry. Dr. Berry requested that our scheduler speak with Dr. Berry's scheduler to get pt on schedule for tomorrow for arteriogram by Dr. Berry.  Dressings applied to wounds of right lower leg and toes.   I discussed in depth with the patient the nature of atherosclerosis, and emphasized the importance of maximal medical management including strict control of blood pressure, blood glucose, and lipid levels, obtaining regular exercise, and continued cessation of smoking.  The patient is aware that without maximal medical management the underlying atherosclerotic disease process will progress, limiting the benefit of any interventions.  The patient was given information about PAD including signs, symptoms, treatment, what symptoms should prompt the patient to seek immediate medical care, and risk reduction measures to take.  Suzanne Nickel, RN, MSN, FNP-C [facility] [place] Office Phone: [phone]  Clinic MD: Dickson  10/16/16 11:15 AM   

## 2016-10-17 NOTE — Progress Notes (Signed)
Site area: left groin fa sheath Site Prior to Removal:  Level 0 Pressure Applied For  20 minutes Manual:   yes Patient Status During Pull:  stable Post Pull Site:  Level 0 Post Pull Instructions Given:  yes Post Pull Pulses Present: absent Dressing Applied:  Gauze and tegaderm Bedrest begins @ 0900 Comments:

## 2016-10-17 NOTE — Consult Note (Signed)
PULMONARY / CRITICAL CARE MEDICINE   Name: FATEMA RABE MRN: [id number] DOB: 05-20-1950    ADMISSION DATE:  10/17/2016 CONSULTATION DATE:  10/17/2016  REFERRING MD:  Rapid Response  CHIEF COMPLAINT:  Hypoxic respiratory distress  HISTORY OF PRESENT ILLNESS:   66 year old female with PMH as below, significant for but not limited to DMT2, PAD, PVD, HTN, HLD, GERD, CAD w/prior MI 2006, former smoker (quit 2011), and CHF (EF 50-55%, G1DD on 10/2015) admitted on 9/20 with gangrene of her right foot- great and second toes.   She previously had left femoral to below-knee popliteal bypass by Dr. Scot Dock on 10/2015.   She has taken one week of two week course of Bactrim prior to admit after being seen in the office for non-healing right foot wound and discoloration.  Underwent an arteriogram by Dr. Gwenlyn Found on 9/20 showing a right common femoral artery occlusion and superficial femoral artery occlusion.  Patient has refused amputation.  Surgery scheduled 9/21 for a right common femoral artery endarterectomy and right femoral to below knee popliteal artery bypass.  On the evening of 9/20, she became acutely short of breath, febrile 101.8, tachycardic, and hypoxic on telemetry, failing NRB, requiring BiPAP.  PCCM called for ongoing hypoxic respiratory distress.   PAST MEDICAL HISTORY :  She  has a past medical history of Anemia (10/2015); Arthritis; CHF (congestive heart failure) ([place]); Complication of anesthesia; Coronary artery disease; Family history of adverse reaction to anesthesia; GERD (gastroesophageal reflux disease); Hip bursitis; History of blood transfusion; Hyperlipidemia LDL goal < 70 (06/28/2013); Hypertension; Malnutrition ([place]); Migraine; Myocardial infarction [facility]) (2011); Neuromuscular disorder ([place]); Osteomyelitis ([place]) (2017); PAD (peripheral artery disease) ([place]); Peripheral vascular disease ([place]); Respiratory failure ([place]) (10/2015); Septic shock ([place]) (10/2015); Stroke [facility]); and Type II  diabetes mellitus ([place]).  PAST SURGICAL HISTORY: She  has a past surgical history that includes PERIPHERAL VASCULAR ANGIOGRAM (01/26/2010); PERIPHERAL VASCULAR ANGIOGRAM (02/23/2010); PERIPHERAL VASCULAR ANGIOGRAM (06/17/2010); PERIPHERAL VASCULAR ANGIOGRAM (06/04/2011); CAROTID DUPLEX (03/19/2011); LEXISCAN MYOVIEW (10/25/2010); transthoracic echocardiogram (10/17/2009); atherectomy (N/A, 06/04/2011); PERIPHERAL VASCULAR ANGIOGRAM (08/28/2015); Iliac artery stent (Left, 08/28/2015); Appendectomy; [place] surgery (1983?); Cesarean section (1990); Coronary angioplasty; Cardiac catheterization (10/13/2009); Cardiac catheterization (N/A, 08/28/2015); Cardiac catheterization (N/A, 08/28/2015); Cardiac catheterization (Left, 08/28/2015); Cardiac catheterization (Left, 08/28/2015); Endarterectomy femoral (Left, 09/05/2015); Intraoperative arteriogram (Left, 09/05/2015); Abdominal hysterectomy; Cataract extraction w/ intraocular lens implant (Right); Cardiac catheterization (N/A, 09/28/2015); Cardiac catheterization (Left, 09/28/2015); Femoral-popliteal Bypass Graft (Left, 11/02/2015); Vein harvest (Left, 11/02/2015); Intraoperative arteriogram (Left, 11/02/2015); I&D extremity (Left, 11/10/2015); Skin split graft (Left, 12/01/2015); and Lower Extremity Angiography (N/A, 10/17/2016).  Allergies  Allergen Reactions  . Doxycycline Other (See Comments)    lethargy  . Hydrochlorothiazide Other (See Comments)    lethargic   . Latex Rash  . Penicillins Swelling and Rash    Pt states she has tolerated Keflex in the past without problems. States she may have tolerated Augmentin in the past but it caused GI upset. Has patient had a PCN reaction causing immediate rash, facial/tongue/throat swelling, SOB or lightheadedness with hypotension: Yes Has patient had a PCN reaction causing severe rash involving mucus membranes or skin necrosis: No Has patient had a PCN reaction that required hospitalization No Has patient had a PCN reaction occurring  within the last 10 years: No    No current facility-administered medications on file prior to encounter.    Current Outpatient Prescriptions on File Prior to Encounter  Medication Sig  . amLODipine (NORVASC) 5 MG tablet TAKE 1 TABLET(5 MG) BY MOUTH DAILY  .  aspirin EC 81 MG tablet Take 81 mg by mouth daily.  Marland Kitchen atorvastatin (LIPITOR) 80 MG tablet TAKE 1 TABLET(80 MG) BY MOUTH DAILY  . clopidogrel (PLAVIX) 75 MG tablet TAKE 1 TABLET(75 MG TOTAL) BY MOUTH DAILY. HOLD FOR VASCULAR SURGERY, RESTART AS SOON AS POSSIBLE AFTER LEG SURGERY(AFTER 8/8)  . furosemide (LASIX) 40 MG tablet Take 40 mg by mouth daily.   Marland Kitchen gabapentin (NEURONTIN) 300 MG capsule Take 300 mg by mouth 3 (three) times daily.   . isosorbide mononitrate (IMDUR) 30 MG 24 hr tablet 30 mg. Take 1 tablet along by mouth with 60 mg to equal 90 mg daily.  . isosorbide mononitrate (IMDUR) 60 MG 24 hr tablet 60 mg. Take 1 tablet by mouth along with 30 mg to equal 90 mg daily  . LEVEMIR FLEXTOUCH 100 UNIT/ML Pen INJECT 30 UNITS INTO THE SKIN HS UTD. DISCONTINUE LANTUS  . losartan (COZAAR) 100 MG tablet TAKE 1 TABLET BY MOUTH EVERY DAY (Patient taking differently: TAKE 100 mg BY MOUTH EVERY DAY)  . metFORMIN (GLUCOPHAGE) 1000 MG tablet Take 1,000 mg by mouth 2 (two) times daily with a meal.  . metoprolol succinate (TOPROL-XL) 50 MG 24 hr tablet TAKE 1 AND 1/2 TABLET BY MOUTH DAILY (Patient taking differently: Take 75 mg by mouth daily. )  . pantoprazole (PROTONIX) 40 MG tablet TAKE 1 TABLET BY MOUTH DAILY (Patient taking differently: TAKE 40 mg  BY MOUTH DAILY)  . potassium chloride (K-DUR,KLOR-CON) 10 MEQ tablet Take 10 mEq by mouth daily.   Marland Kitchen sulfamethoxazole-trimethoprim (BACTRIM,SEPTRA) 400-80 MG tablet Take 1 tablet by mouth 2 (two) times daily.  . BD PEN NEEDLE NANO U/F 32G X 4 MM MISC U UTD D  . ONE TOUCH ULTRA TEST test strip USE TO TEST EVERY DAY AS DIRECTED    FAMILY HISTORY:  Her indicated that her mother is deceased. She  indicated that her father is deceased.    SOCIAL HISTORY: She  reports that she quit smoking about 7 years ago. Her smoking use included Cigarettes. She has a 61.50 pack-year smoking history. She has never used smokeless tobacco. She reports that she does not drink alcohol or use drugs.  REVIEW OF SYSTEMS:   Unable- patient on BiPAP  SUBJECTIVE:   VITAL SIGNS: BP (!) 144/61   Pulse (!) 122   Temp 99.8 F (37.7 C) (Oral)   Resp (!) 34   Ht 5' 4"  (1.626 m)   Wt 164 lb (74.4 kg)   SpO2 (!) 88%   BMI 28.15 kg/m   HEMODYNAMICS:    VENTILATOR SETTINGS: FiO2 (%):  [50 %] 50 %  INTAKE / OUTPUT: I/O last 3 completed shifts: In: 240 [P.O.:240] Out: 600 [Urine:600]  PHYSICAL EXAMINATION: General:  Adult female sitting in bed in moderate respiratory distress  HEENT: PERRL, No JVD PSY: Anxious Neuro: Alert, follows commands, MAE CV: ST 120's, left groin site soft, level 0, - no DP or popliteal pulses bilaterally (known) PULM: even/labored on BiPAP, lungs bilaterally w/exp wheeze, bibasilar rales GI: obese, soft, non-tender, bs active  Extremities: warm/dry, trace BLE edema, right foot wrapped in kerlix bandage Skin: no rashes  LABS:  BMET  Recent Labs Lab 10/17/16 0634  NA 138  K 4.9  CL 107  CO2 20*  BUN 27*  CREATININE 1.30*  GLUCOSE 82    Electrolytes  Recent Labs Lab 10/17/16 0634  CALCIUM 9.0    CBC  Recent Labs Lab 10/17/16 0634  WBC 18.4*  HGB 9.5*  HCT 28.0*  PLT 337    Coag's  Recent Labs Lab 10/17/16 0634  INR 1.23    Sepsis Markers No results for input(s): LATICACIDVEN, PROCALCITON, O2SATVEN in the last 168 hours.  ABG No results for input(s): PHART, PCO2ART, PO2ART in the last 168 hours.  Liver Enzymes No results for input(s): AST, ALT, ALKPHOS, BILITOT, ALBUMIN in the last 168 hours.  Cardiac Enzymes No results for input(s): TROPONINI, PROBNP in the last 168 hours.  Glucose  Recent Labs Lab 10/17/16 0607  10/17/16 0842 10/17/16 2038  GLUCAP 84 80 123*    Imaging Dg Chest Port 1v Same Day  Result Date: 10/17/2016 CLINICAL DATA:  Shortness of breath EXAM: PORTABLE CHEST 1 VIEW COMPARISON:  05/31/2016 FINDINGS: Probable tiny right pleural effusion. Heart size within normal limits. Diffuse increased interstitial opacity, may reflect mild edema or interstitial inflammatory process. Aortic atherosclerosis. No pneumothorax. IMPRESSION: Possible tiny right effusion. Diffuse increased interstitial opacity, may reflect mild interstitial edema or infection. Electronically Signed   By: Donavan Foil M.D.   On: 10/17/2016 21:07   STUDIES:  9/20 arteriogram >> shows that the common iliac and external iliac arteries are patent. There is an occlusion of the common femoral artery and superficial femoral artery. There is reconstitution of the below-knee popliteal artery. There is 2 vessel runoff via the posterior tibial artery and peroneal arteries. The posterior tibial artery is the dominant runoff  CULTURES: BC 9/20>> Sputum   ANTIBIOTICS: Vanc 9/20 >  SIGNIFICANT EVENTS: 9/20 Admit, arteriogram, respiratory distress -> ICU  LINES/TUBES: PIV x 2  DISCUSSION: 89 yoF w/extensive prior vascular disease admitted with gangrene and occluded common femoral artery and superficial femoral artery, tentatively scheduled for fem-pop bypass 9/21 who went into acute hypoxic respiratory distress, likely flash pulm edema, requiring BiPAP and transfer to ICU.  ASSESSMENT / PLAN:  PULMONARY A: Acute hypoxic respiratory failure- likely multifactoral- but ddx flash pulmonary edema vs PE (Wells score indeterminate/low)- hypoxia improved w/BiPAP, vs PNA Former smoker (41 pack year hx, not on home nebs) P:   tx to ICU  Continue BiPAP Remains high risk for intubation ABG now  Xopenex and atrovent nebs q 6hr prn  Solumedrol 35m x 1 now (exp wheeze, most likely r/t cardiac) Lasix 456mIV x 1 now Additional  Morphine 2 mg now Consider VQ scan if hypoxia continues See ID   CARDIOVASCULAR A:  Gangrene of R foot w/multilevel arterial occlusive disease CAD, HF (10/17 EF 50-55%, G1DD), PVD, PAD, HLD P:  Tele monitoring EKG noted Trend Troponin q 6 x 3  Surgical management per Vascular  Trend Lactic acid x 3  ASA and plavix per VVS  RENAL A:   AKI- at risk for further/ possible ATN 2/2 dye load/ previously on bactrim  P:   Insert Foley Lasix 4060m 1 now Ideally would like to give  BMET pending  Strict I/Os, daily weights Trend renal panel, Mg, phos  GASTROINTESTINAL A:   Hx GERD P:   NPO while on BiPAP  HEMATOLOGIC A:   Leukocytosis Anemia- chronic P:  Trend CBC Will confer with VVS for VTE ppx   INFECTIOUS A:   Gangrene R foot Concern for osteomyelitis - sed rate 44 Leukocytosis R/o CAP P:   Follow BC Send sputum  Continue vanc, add cefepime for CAP coverage Will need imaging of right foot to confirm/ rule out osteo Trend PCT    ENDOCRINE A:   DMT2 P:   CBG q 4 SSI  NEUROLOGIC A:   No acute issues  P:   Monitor  FAMILY  - Updates: Patient updated. No family at bedside.   - Inter-disciplinary family meet or Palliative Care meeting due by:  10/25/2016  CCT 45 mins  Kennieth Rad, ACNP Pulmonary and [place] Pager: [phone]  10/17/2016, 11:13 PM .STAFF NOTE  I, Dr Seward Carol have personally reviewed patient's available data, including medical history, events of note, physical examination and test results as part of my evaluation. I have discussed with ACNP Moshe Cipro and other care providers such as pharmacist, RN and RRT.  In addition,  I personally evaluated patient. I agree with the above note including assessment and plan by ACNP Simpson.   66 yr old female with PMHx CHF EF 55-60%, CAD, GERD, HLD, MI in 2006, Left foot Osteomyelitis in 2017, PAD, s/p left CFA endartectomy with profundoplasty and vein  patch angioplasty with left great saphenous vein, IDDM, CVA, presented after being seen by Vascular as an outpatient. She had a toenail removed from her right great toe and subsequently developed wounds on the right first second and third toes and now gangrene of forefoot. She also has multilevel occlusive disease.  In an attempt to salvage her limb she is scheduled for right CFA endartectomy and fempop bypass grafting. Rapid r4esponse called for desaturation, fever, tachycardic and increased work of breathing. CXR shows pulmonary vascular congestion with a small right sided effusion, EKG reviewed, pt placed on NIPPV and given lasix for diuresis and steroids. .  Plan: Pt transferred to 2 M ICU. Acute hypoxic respiratory failure secondary to acute cardiogenic Pulmonary edema vs PNA vs PE.  Well's score low pretest probability.  Pt covered with broad spectrum antibiotics and appropriate cx sent. Will get an ABG in 1 hr post NIPPV. If pt has increased work of breathing, increased secretion, change in mental status, or any contraindication to NIPPV may require intubation.  If pt tolerates NIPPV therapy will continue overnight and wean in AM. Significant arterial occlusive disease with gangrene of the right extremity for revascularization on 9/21. VTE PPX held for procedure. With the Right extremity appearance, elevated ESR and h/o IDDM concern for osteomyelitis patient on vancomycin IV.  ISS per ICU Glycemic protocol, pt remains awake alert and appropriate.   The patient is critically ill with multiple organ systems failure and requires high complexity decision making for assessment and support, frequent evaluation and titration of therapies, application of advanced monitoring technologies and extensive interpretation of multiple databases.  Critical Care Time devoted to patient care services described in this note is 45 Minutes. This time reflects time of care of this signee Dr Seward Carol. This critical care  time does not reflect procedure time, or teaching time or supervisory time of PA/NP/Med student/Med Resident etc but could involve care discussion time   Disposition: ICU Critical Care Time: 45 mins Family: daughter at bedside Code Status: Full  Dr. Seward Carol Pulmonary Critical Care Medicine Locums  10/18/2016 1:10 AM

## 2016-10-17 NOTE — H&P (Signed)
Patient name: [name] [name] MRN: [id number] DOB: 22-Aug-1950 Sex: female   REASON FOR ADMISSION:    Gangrene of right forefoot with infrainguinal arterial occlusive disease.  HPI:   [name] [name] is a pleasant 66 y.o. female,  Who had a toenail removed from her right great toe and subsequently developed wounds on the right first second and third toes. She was seen yesterday in our office by our nurse practitioner with gangrene of the forefoot and evidence of multilevel arterial occlusive disease. She was set up for an arteriogram today by Dr. Nanetta Batty. 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 She is admitted for right common femoral artery endarterectomy and right femoral to below knee popliteal artery bypass.  I originally saw the patient in consultation on 08/28/2015 with a nonhealing wound of the left lower extremity. On 09/05/2015, she underwent a left common femoral artery endarterectomy with profundoplasty and vein patch angioplasty with left great saphenous vein. At the time of surgery because of diffuse disease in the external iliac artery I was unable to get adequate inflow and therefore did not proceed with femoropopliteal bypass grafting on the left. She subsequently had angioplasty and stenting of the external iliac artery by Dr. Nanetta Batty. The wounds on the left foot ultimately healed.  She has a long history of bilateral lower extremity claudication.  The patient had a myocardial infarction in 2006. She had PTCA back at that time. She denies any recent chest pain or chest pressure.  Past Medical History:  Diagnosis Date  . Anemia 10/2015   Acute Blood Loss  . Arthritis    "feel like I have it all over" (08/28/2015)  . CHF (congestive heart failure) ([facility])   . Complication of anesthesia    DIFFICULT WAKING "only when I was smoking; no problems since I quit"  . Coronary artery disease   . Family history of  adverse reaction to anesthesia    sister slow to wake up  . GERD (gastroesophageal reflux disease)    takes Protonix daily   . Hip bursitis   . History of blood transfusion    10/2015  . Hyperlipidemia LDL goal < 70 06/28/2013   takes Atorvastatin daily  . Hypertension    takes Metoprolol and Imdur daily  . Malnutrition ([facility])   . Migraine    "none in a long time" (08/28/2015)  . Myocardial infarction ([facility]) 2011  . Neuromuscular disorder ([facility])    DIABETIC NEUROPATHY  . Osteomyelitis ([facility]) 2017   Left foot  . PAD (peripheral artery disease) ([facility])   . Peripheral vascular disease ([facility])   . Respiratory failure ([facility]) 10/2015   Acute Hypoxia- acute pulmonary edema 11/13/2015  . Septic shock ([facility]) 10/2015  . Stroke ([facility])   . Type II diabetes mellitus ([facility])    takes Lantus nightly.Average fasting blood sugar runs 80-90  Type II   Family History  Problem Relation Age of Onset  . Hypertension Mother   . Heart failure Mother   . Heart failure Father   . Stroke Father   . Diabetes Father    SOCIAL HISTORY: The patient quit smoking in 2011. Social History   Social History  . Marital status: Married    Spouse name: N/A  . Number of children: N/A  . Years of education: N/A   Occupational History  . retired    Social History Main Topics  . Smoking status: Former Smoker  Packs/day: 1.50    Years: 41.00    Types: Cigarettes    Quit date: 10/12/2009  . Smokeless tobacco: Never Used  . Alcohol use No  . Drug use: No  . Sexual activity: Not Currently    Birth control/ protection: Surgical   Other Topics Concern  . Not on file   Social History Narrative  . No narrative on file    Allergies  Allergen Reactions  . Doxycycline Other (See Comments)    lethargy  . Hydrochlorothiazide Other (See Comments)    lethargic   . Latex Rash  . Penicillins Swelling and Rash    Pt states she has tolerated Keflex in the past without problems. States she may have tolerated Augmentin  in the past but it caused GI upset. Has patient had a PCN reaction causing immediate rash, facial/tongue/throat swelling, SOB or lightheadedness with hypotension: Yes Has patient had a PCN reaction causing severe rash involving mucus membranes or skin necrosis: No Has patient had a PCN reaction that required hospitalization No Has patient had a PCN reaction occurring within the last 10 years: No    Current Facility-Administered Medications  Medication Dose Route Frequency Provider Last Rate Last Dose  . 0.9 %  sodium chloride infusion  250 mL Intravenous PRN Runell Gess, MD      . 0.9 %  sodium chloride infusion   Intravenous Continuous Runell Gess, MD      . 0.9% sodium chloride infusion  1 mL/kg/hr Intravenous Continuous Runell Gess, MD 74.4 mL/hr at 10/17/16 0755 1 mL/kg/hr at 10/17/16 0755  . sodium chloride flush (NS) 0.9 % injection 3 mL  3 mL Intravenous Q12H Runell Gess, MD      . sodium chloride flush (NS) 0.9 % injection 3 mL  3 mL Intravenous PRN Runell Gess, MD        REVIEW OF SYSTEMS:  [X]  denotes positive finding, [ ]  denotes negative finding Cardiac  Comments:  Chest pain or chest pressure:    Shortness of breath upon exertion: X   Short of breath when lying flat: X   Irregular heart rhythm:        Vascular    Pain in calf, thigh, or hip brought on by ambulation:    Pain in feet at night that wakes you up from your sleep:  X   Blood clot in your veins:    Leg swelling:  X       Pulmonary    Oxygen at home:    Productive cough:     Wheezing:         Neurologic    Sudden weakness in arms or legs:     Sudden numbness in arms or legs:     Sudden onset of difficulty speaking or slurred speech:    Temporary loss of vision in one eye:     Problems with dizziness:         Gastrointestinal    Blood in stool:     Vomited blood:         Genitourinary    Burning when urinating:     Blood in urine:        Psychiatric    Major  depression:         Hematologic    Bleeding problems:    Problems with blood clotting too easily:        Skin    Rashes or ulcers: X  Constitutional    Fever or chills:     PHYSICAL EXAM:   Vitals:   10/17/16 0855 10/17/16 0905 10/17/16 0910 10/17/16 0925  BP: (!) 145/78 (!) 139/49 (!) 137/44 (!) 126/41  Pulse: 85 91 84 82  Resp: 12 (!) Temp:      TempSrc:      SpO2: 96% 94% 95% 97%  Weight:      Height:        GENERAL: The patient is a well-nourished female, in no acute distress. The vital signs are documented above. CARDIAC: There is a regular rate and rhythm.  VASCULAR: I do not detect carotid bruits. On the right side, which is the site of concern, I cannot palpate femoral, popliteal, or pedal pulses. On the left side she has a palpable femoral pulse. I cannot palpate popliteal or pedal pulses. She has some left lower extremity swelling. PULMONARY: There is good air exchange bilaterally without wheezing or rales. ABDOMEN: Soft and non-tender with normal pitched bowel sounds.  MUSCULOSKELETAL: There are no major deformities or cyanosis. NEUROLOGIC: No focal weakness or paresthesias are detected. SKIN: She has dry gangrene involving the right first second and third toes. There is mild cellulitis. PSYCHIATRIC: The patient has a normal affect.  DATA:    ARTERIOGRAM: I reviewed her arteriogram that was performed today. This shows that the common iliac and external iliac arteries are patent. There is an occlusion of the common femoral artery and superficial femoral artery. There is reconstitution of the below-knee popliteal artery. There is 2 vessel runoff via the posterior tibial artery and peroneal arteries. The posterior tibial artery is the dominant runoff.  ECHO: Echo on 10/11/2017showed an ejection fraction of 50-55%. There is akinesis of the basal mid inferior septal myocardium.   CAROTID DUPLEX: Carotid duplex scan on 07/17/16 showed an occluded right  internal carotid artery with a less than 39% left carotid stenosis.  MEDICAL ISSUES:   GANGRENE OF THE RIGHT FOOT WITH MULTILEVEL ARTERIAL OCCLUSIVE DISEASE: This patient presents with extensive gangrene involving the first second and third toes of the right foot. She has diffuse multilevel arterial occlusive disease with an occluded common femoral artery and superficial femoral artery. She reconstitutes her below knee popliteal artery. Dominant runoff is via the posterior tibial artery. The peroneal artery is also patent but small in the distal anterior tibial artery reconstitutes distally. A think without revascularization she would clearly require below the knee or above-knee amputation. Her only option for limb salvage would be right common femoral artery endarterectomy and femoropopliteal bypass grafting. I have discussed the procedure and potential consultations with the patient and she is agreeable to proceed tomorrow.  In addition, I have recommended that we proceed with the indication of the right first second and third toes. I have also explained that we could potentially need to proceed with a formal transmetatarsal amputation. I have ordered vein mapped today and certainly would like to use vein if at all possible for her femoropopliteal bypass.  DIABETES: I will resume her current home medications and start her on a sliding scale. If we have problems with sugar management postoperatively we may require help from the hospitalist.  HISTORY OF CONGESTIVE HEART FAILURE: She does have a history of congestive heart failure and we will certainly have to be careful with her fluids perioperatively. We will need cardiology's assistant in the postoperative period given her history.  Waverly Ferrari [facility] [place] [phone]

## 2016-10-17 NOTE — H&P (Signed)
She has rest pain in her right lower leg and foot.   Dr. Edilia Bo last evaluated pt on 04-10-16. At that time her bypass graft was patent without any areas of restenosis noted. Dr. Edilia Bo advised follow up graft duplex in 6 months and see our nurse practitioner at that time. Her ABIs were done at Dr. Clayborne Dana office. She is on aspirin and is on a statin. She has successfully quit smoking. Hercarotid Doppler studies were done at Dr. Clayborne Dana office. Her study in September 2017 showed a less than 39% left carotid stenosis with a known occlusion of the right internal carotid artery.  Pt is taking Bactrim prescribed by provider from Dr. Audrie Lia office.   Pt Diabetic: Yes Pt smoker: former smoker, quit in 2011  Pt meds include: Statin :Yes Betablocker: Yes ASA: Yes Other anticoagulants/antiplatelets: Plavix      Past Medical History:  Diagnosis Date  . Anemia 10/2015   Acute Blood Loss  . Arthritis    "feel like I have it all over" (08/28/2015)  . CHF (congestive heart failure) (HCC)   . Complication of anesthesia    DIFFICULT WAKING "only when I was smoking; no problems since I quit"  . Coronary artery disease   . Family history of adverse reaction to anesthesia    sister slow to wake up  . GERD (gastroesophageal reflux disease)    takes Protonix daily   . Hip bursitis   . History of blood transfusion    10/2015  . Hyperlipidemia LDL goal < 70 06/28/2013   takes Atorvastatin daily  . Hypertension    takes Metoprolol and Imdur daily  . Malnutrition (HCC)   . Migraine    "none in a long time" (08/28/2015)  . Myocardial infarction (HCC) 2011  . Neuromuscular disorder (HCC)    DIABETIC NEUROPATHY  . Osteomyelitis (HCC) 2017   Left foot  . PAD (peripheral artery disease) (HCC)   . Peripheral vascular disease (HCC)   . Respiratory failure (HCC) 10/2015   Acute Hypoxia- acute pulmonary edema 11/13/2015  . Septic shock (HCC) 10/2015    . Stroke (HCC)   . Type II diabetes mellitus (HCC)    takes Lantus nightly.Average fasting blood sugar runs 80-90  Type II    Social History      Social History  Substance Use Topics  . Smoking status: Former Smoker    Packs/day: 1.50    Years: 41.00    Types: Cigarettes    Quit date: 10/12/2009  . Smokeless tobacco: Never Used  . Alcohol use No    Family History      Family History  Problem Relation Age of Onset  . Hypertension Mother   . Heart failure Mother   . Heart failure Father   . Stroke Father   . Diabetes Father          Past Surgical History:  Procedure Laterality Date  . ABDOMINAL HYSTERECTOMY    . APPENDECTOMY    . ATHERECTOMY N/A 06/04/2011   Procedure: ATHERECTOMY;  Surgeon: Runell Gess, MD;  [facility] C[facility] LAB;  Service: Cardiovascular;  Laterality: N/A;  . CARDIAC CATHETERIZATION  10/13/2009   95% stenosis in the AV groove circumflex and 95% ostial stenosis in small OM3. A 3x48mm drug-eluting Promus stent inserted ito the circumflex. Dilatated with a 3.25x77mm noncompliant Quantum balloon within entire segment. The entire region was reduced to 0% and brisk TIMI3 flow.  . CAROTID DUPLEX  03/19/2011   Right ICA-demonstrates  complete occlusion. Left ICA-demonstrates a small amount of fibrous plaque.  Marland Kitchen CATARACT EXTRACTION W/ INTRAOCULAR LENS IMPLANT Right   . CESAREAN SECTION  1990  . CORONARY ANGIOPLASTY    . ENDARTERECTOMY FEMORAL Left 09/05/2015   Procedure: ENDARTERECTOMY FEMORAL WITH PROFUNDOPLASTY;  Surgeon: Chuck Hint, MD;  Location: [facility] OR;  Service: Vascular;  Laterality: Left;  Left common femoral artery vein patch using left saphenous vien  . FEMORAL-POPLITEAL BYPASS GRAFT Left 11/02/2015   Procedure: BYPASS GRAFT FEMORAL-POPLITEAL ARTERY VS FEMORAL-TIBIAL ARTERY BYPASS;  Surgeon: Chuck Hint, MD;  Location: [facility] OR;  Service: Vascular;  Laterality: Left;  . I&D EXTREMITY Left 11/10/2015    Procedure: Debridement Left Foot Ulcer, Application  Wound VAC;  Surgeon: [name], MD;  Location: [facility] OR;  Service: Orthopedics;  Laterality: Left;  . ILIAC ARTERY STENT Left 08/28/2015   common  . INTRAOPERATIVE ARTERIOGRAM Left 09/05/2015   Procedure: INTRA OPERATIVE ARTERIOGRAM;  Surgeon: Chuck Hint, MD;  Location: [facility] OR;  Service: Vascular;  Laterality: Left;  . INTRAOPERATIVE ARTERIOGRAM Left 11/02/2015   Procedure: INTRA OPERATIVE ARTERIOGRAM;  Surgeon: Chuck Hint, MD;  Location: [facility] OR;  Service: Vascular;  Laterality: Left;  . LEXISCAN MYOVIEW  10/25/2010   Moderate perfusion defect due to infarct/scar with mild perinfarct ischemia seen in the Basal Inferolateral, Basal Anterolateral, Mid Inferolateral, and Mid Anterolateral regions. Post-stress EF is 50%.  . OVARY SURGERY  1983?   "ruptured"  . PERIPHERAL VASCULAR ANGIOGRAM  01/26/2010   High-grade SFA disease: left greater than right. Left SFA would require fem-pop bypass grafting. Right SFA could be stented but might require Diamondback Orbital atherectomy.  Marland Kitchen PERIPHERAL VASCULAR ANGIOGRAM  02/23/2010   Stealth Predator orbital rotational atherectomy performed on SFA & Popliteal up to 90,000 RPM. Stenting using overlapping 5x128mm and 5x31mm Absolute Pro Nitinol self-expanding stents beginning just at the knee up to the mid SFA resulting in reduction of 90-95% calcified SFA & Popliteal stenosis to 0. Stenting performed on the distal common & proximal iliac artery with a 10x4 Absolute Pro- 70-0%.  Marland Kitchen PERIPHERAL VASCULAR ANGIOGRAM  06/17/2010   PTA performed to the right external iliac artery stent using a 5x100 balloon at 10 atmospheres. Stenting performed using a 6x18 Genesis on Opta balloon. Postdilatation with a 7x2 balloon resulting in a 95% "in-stent" stenosis to 0% residual.  . PERIPHERAL VASCULAR ANGIOGRAM  06/04/2011   Bilateral total SFAs not percutaneously addressable. Good canidate for  femoropopliteal bypass grafting  . PERIPHERAL VASCULAR ANGIOGRAM  08/28/2015  . PERIPHERAL VASCULAR CATHETERIZATION N/A 08/28/2015   Procedure: Lower Extremity Angiography;  Surgeon: Runell Gess, MD;  Location: [facility] INVASIVE CV LAB;  Service: Cardiovascular;  Laterality: N/A;  . PERIPHERAL VASCULAR CATHETERIZATION N/A 08/28/2015   Procedure: Abdominal Aortogram;  Surgeon: Runell Gess, MD;  Location: [facility] INVASIVE CV LAB;  Service: Cardiovascular;  Laterality: N/A;  . PERIPHERAL VASCULAR CATHETERIZATION Left 08/28/2015   Procedure: Peripheral Vascular Intervention;  Surgeon: Runell Gess, MD;  Location: [facility] INVASIVE CV LAB;  Service: Cardiovascular;  Laterality: Left;  common iliac  . PERIPHERAL VASCULAR CATHETERIZATION Left 08/28/2015   Procedure: Peripheral Vascular Atherectomy;  Surgeon: Runell Gess, MD;  Location: [facility] INVASIVE CV LAB;  Service: Cardiovascular;  Laterality: Left;  common iliac  . PERIPHERAL VASCULAR CATHETERIZATION N/A 09/28/2015   Procedure: Lower Extremity Angiography;  Surgeon: Runell Gess, MD;  Location: [facility] INVASIVE CV LAB;  Service: Cardiovascular;  Laterality: N/A;  . PERIPHERAL VASCULAR CATHETERIZATION Left 09/28/2015  Procedure: Peripheral Vascular Intervention;  Surgeon: Runell Gess, MD;  Location: [facility] INVASIVE CV LAB;  Service: Cardiovascular;  Laterality: Left CFA  PCI with 9 mm x 4 cm Abbott nitinol absolute Pro self-expanding stent     . SKIN SPLIT GRAFT Left 12/01/2015   Procedure: LEFT FOOT SKIN GRAFT AND VAC;  Surgeon: [name], MD;  Location: [facility] OR;  Service: Orthopedics;  Laterality: Left;  . TRANSTHORACIC ECHOCARDIOGRAM  10/17/2009   EF 45-50%, moderate hypokinesis of the entire inferolateral myocardium, mild concentric hypertrophy and mild regurg of the mitral valva.  Marland Kitchen VEIN HARVEST Left 11/02/2015   Procedure: LEFT GREATER SAPHENOUS VEIN HARVEST;  Surgeon: Chuck Hint, MD;  Location: [facility] OR;  Service: Vascular;   Laterality: Left;         Allergies  Allergen Reactions  . Doxycycline Other (See Comments)    lethargy  . Hydrochlorothiazide Other (See Comments)    lethargic   . Latex Rash  . Penicillins Swelling and Rash    Pt states she has tolerated Keflex in the past without problems. States she may have tolerated Augmentin in the past but it caused GI upset. Has patient had a PCN reaction causing immediate rash, facial/tongue/throat swelling, SOB or lightheadedness with hypotension: Yes Has patient had a PCN reaction causing severe rash involving mucus membranes or skin necrosis: No Has patient had a PCN reaction that required hospitalization No Has patient had a PCN reaction occurring within the last 10 years: No          Current Outpatient Prescriptions  Medication Sig Dispense Refill  . amLODipine (NORVASC) 5 MG tablet TAKE 1 TABLET(5 MG) BY MOUTH DAILY 30 tablet 0  . aspirin EC 81 MG tablet Take 81 mg by mouth daily.    Marland Kitchen atorvastatin (LIPITOR) 80 MG tablet TAKE 1 TABLET(80 MG) BY MOUTH DAILY 90 tablet 0  . BD PEN NEEDLE NANO U/F 32G X 4 MM MISC U UTD D  5  . clopidogrel (PLAVIX) 75 MG tablet TAKE 1 TABLET(75 MG TOTAL) BY MOUTH DAILY. HOLD FOR VASCULAR SURGERY, RESTART AS SOON AS POSSIBLE AFTER LEG SURGERY(AFTER 8/8) 90 tablet 1  . furosemide (LASIX) 40 MG tablet     . gabapentin (NEURONTIN) 300 MG capsule Take 300 mg by mouth 3 (three) times daily.     . isosorbide mononitrate (IMDUR) 30 MG 24 hr tablet 30 mg. Take 1 tablet along by mouth with 60 mg to equal 90 mg daily.    . isosorbide mononitrate (IMDUR) 60 MG 24 hr tablet 60 mg. Take 1 tablet by mouth along with 30 mg to equal 90 mg daily    . LEVEMIR FLEXTOUCH 100 UNIT/ML Pen INJECT 30 UNITS INTO THE SKIN HS UTD. DISCONTINUE LANTUS  3  . losartan (COZAAR) 100 MG tablet TAKE 1 TABLET BY MOUTH EVERY DAY 90 tablet 1  . metFORMIN (GLUCOPHAGE) 1000 MG tablet Take 1,000 mg by mouth 2 (two) times daily with a meal.     . metoprolol succinate (TOPROL-XL) 50 MG 24 hr tablet TAKE 1 AND 1/2 TABLET BY MOUTH DAILY 45 tablet 5  . ONE TOUCH ULTRA TEST test strip USE TO TEST EVERY DAY AS DIRECTED  9  . pantoprazole (PROTONIX) 40 MG tablet TAKE 1 TABLET BY MOUTH DAILY 90 tablet 3  . potassium chloride (K-DUR,KLOR-CON) 10 MEQ tablet daily.     Marland Kitchen sulfamethoxazole-trimethoprim (BACTRIM,SEPTRA) 400-80 MG tablet Take 1 tablet by mouth 2 (two) times daily.  No current facility-administered medications for this visit.     ROS: See HPI for pertinent positives and negatives.   Physical Examination      Vitals:   10/16/16 1055 10/16/16 1100  BP: 140/67 134/70  Pulse: 91   Resp: 20   Temp: 98 F (36.7 C)   TempSrc: Oral   Weight: 164 lb (74.4 kg)   Height: 5\' 4"  (1.626 m)    Body mass index is 28.15 kg/m.  General: A&O x 3, WDWN, female. Gait: not observed Eyes: PERRLA. Pulmonary: Respirations are non labored, CTAB, good air movement Cardiac: regular Rhythm, no detected murmur.         Carotid Bruits Right Left   Positive Positive   Radial pulses are 2+ palpable bilaterally   Adominal aortic pulse is not palpable                         VASCULAR EXAM: Extremities with ischemic changes: right foot: first, second, and third toes with dry and wet gangrene, pale tissue proximal to gangrene, foul odor to toes,  with open wound right lower leg (anterior/lateral aspect). The open wound has no erythema, no odor. Left foot with healing and contracting ulcer. See photos below.   Right foot   Right foot   Right lower leg, anterior/lateral aspect   Left foot                                                                                                                                                          LE Pulses Right Left       FEMORAL  1+ palpable  2-3+ palpable        POPLITEAL  not palpable   not palpable       POSTERIOR TIBIAL  not palpable    palpable        DORSALIS PEDIS      ANTERIOR TIBIAL not palpable  palpable    Abdomen: soft, NT, no palpable masses. Skin: no rashes, see Extremities Musculoskeletal: no muscle wasting or atrophy.      Neurologic: A&O X 3; Appropriate Affect ; SENSATION: normal; MOTOR FUNCTION:  moving all extremities equally, motor strength 5/5 throughout. Speech is fluent/normal. CN 2-12 intact.    ASSESSMENT: [name] [name] is a 66 y.o. female who had presented with a nonhealing wound of her left lower extremity. She had multilevel disease and Dr. Nanetta Batty has addressed her inflow disease on the left.  She had a left femoral to below-knee popliteal artery bypass with vein on 11/02/2015 by Dr. Edilia Bo.   She returns today for follow up of the left leg bypass, and duplex of left leg was done.  The wound on her left foot has also completely healed. She has rest pain  in her right lower leg and foot.   Right first, second, and third toes are gangrenous and malodorous. There is a large shallow ulcer on the anterior/lateral aspect of her right lower leg, not malodorous. See above photos.   She has taken one week of a two week course of po Bactrim prescribed by a provider in Dr. Audrie Lia office.    DATA  Left LE Arterial Duplex (10/16/16): Distal bypass to native artery not adequately visualized.  No significant change compared to the exam on 04-10-16.  Patent left leg bypass with no significant stenosis visualized.    PLAN:  Dr. Edilia Bo spoke with pt and daughter and examined pt. Dr. Edilia Bo spoke with Dr. Allyson Sabal. Dr. Allyson Sabal requested that our scheduler speak with Dr. Hazle Coca scheduler to get pt on schedule for tomorrow for arteriogram by Dr. Allyson Sabal.  Dressings applied to wounds of right lower leg and toes.   I discussed in depth with the patient the nature of atherosclerosis, and emphasized the importance of maximal medical management including strict control of blood pressure,  blood glucose, and lipid levels, obtaining regular exercise, and continued cessation of smoking.  The patient is aware that without maximal medical management the underlying atherosclerotic disease process will progress, limiting the benefit of any interventions.  The patient was given information about PAD including signs, symptoms, treatment, what symptoms should prompt the patient to seek immediate medical care, and risk reduction measures to take.  Charisse March, RN, MSN, FNP-C [facility] Phone: [phone]  Clinic MD: Edilia Bo  10/16/16 11:15 AM   Addendum: Peggye Form seen and examined Ms. Franek. She has known critical PAD with a healed left foot ulcer and now with critical limb ischemia on her right foot as result of instrumentation several weeks ago. She has a total right SFA with high-grade calcified right common femoral artery stenosis and tibial vessel disease. She is referred to me by Dr. Edilia Bo for angiography to determine suitability for potential surgical revascularization.  Runell Gess, M.D., FACP, [facility], [facility] [address] [place], [place]  [address]  [phone] 10/17/2016 7:40 AM

## 2016-10-17 NOTE — Consult Note (Addendum)
Medical Consultation   [name] [name]  ZOX:[id number]  DOB: [birth year]  DOA: 10/17/2016  PCP: Georgann Housekeeper, MD  Outpatient Specialists: Dr. Edilia Bo performed PAD cath today   Requesting physician: Dr. Randie Heinz  Reason for consultation: Respiratory distress   History of Present Illness: [name] [name] is an 66 y.o. female with history of CAD, PAD, HTN, MI, DM2.  Patient recently had toenail removed from R great toe and subsequently developed wounds on R 1st-3rd toes which have progressed to gangrene of forefoot.  She was admitted to hospital and underwent arteriogram earlier today which demonstrated R common femoral artery occlusion and SFA occlusion.  She was admitted and plan was to perform R fem-pop bypass in the next day or two.  She was placed on vancomycin.  Post procedurally she began to develop SOB and was given  PO lasix at 11am.  This evening she has developed progressive SOB and respiratory distress as well as increasing O2 requirement.  Hospitalist was asked to evaluate patient.   Review of Systems:  ROS As per HPI otherwise 10 point review of systems negative.    Past Medical History: Past Medical History:  Diagnosis Date  . Anemia 10/2015   Acute Blood Loss  . Arthritis    "feel like I have it all over" (08/28/2015)  . CHF (congestive heart failure) (HCC)   . Complication of anesthesia    DIFFICULT WAKING "only when I was smoking; no problems since I quit"  . Coronary artery disease   . Family history of adverse reaction to anesthesia    sister slow to wake up  . GERD (gastroesophageal reflux disease)    takes Protonix daily   . Hip bursitis   . History of blood transfusion    10/2015  . Hyperlipidemia LDL goal < 70 06/28/2013   takes Atorvastatin daily  . Hypertension    takes Metoprolol and Imdur daily  . Malnutrition (HCC)   . Migraine    "none in a long time" (08/28/2015)  . Myocardial infarction (HCC) 2011  . Neuromuscular disorder  (HCC)    DIABETIC NEUROPATHY  . Osteomyelitis (HCC) 2017   Left foot  . PAD (peripheral artery disease) (HCC)   . Peripheral vascular disease (HCC)   . Respiratory failure (HCC) 10/2015   Acute Hypoxia- acute pulmonary edema 11/13/2015  . Septic shock (HCC) 10/2015  . Stroke (HCC)   . Type II diabetes mellitus (HCC)    takes Lantus nightly.Average fasting blood sugar runs 80-90  Type II    Past Surgical History: Past Surgical History:  Procedure Laterality Date  . ABDOMINAL HYSTERECTOMY    . APPENDECTOMY    . ATHERECTOMY N/A 06/04/2011   Procedure: ATHERECTOMY;  Surgeon: Runell Gess, MD;  Location: [facility] CATH LAB;  Service: Cardiovascular;  Laterality: N/A;  . CARDIAC CATHETERIZATION  10/13/2009   95% stenosis in the AV groove circumflex and 95% ostial stenosis in small OM3. A 3x65mm drug-eluting Promus stent inserted ito the circumflex. Dilatated with a 3.25x37mm noncompliant Quantum balloon within entire segment. The entire region was reduced to 0% and brisk TIMI3 flow.  . CAROTID DUPLEX  03/19/2011   Right ICA-demonstrates complete occlusion. Left ICA-demonstrates a small amount of fibrous plaque.  Marland Kitchen CATARACT EXTRACTION W/ INTRAOCULAR LENS IMPLANT Right   . CESAREAN SECTION  1990  . CORONARY ANGIOPLASTY    . ENDARTERECTOMY FEMORAL Left 09/05/2015   Procedure: ENDARTERECTOMY  FEMORAL WITH PROFUNDOPLASTY;  Surgeon: Chuck Hint, MD;  Location: [facility] OR;  Service: Vascular;  Laterality: Left;  Left common femoral artery vein patch using left saphenous vien  . FEMORAL-POPLITEAL BYPASS GRAFT Left 11/02/2015   Procedure: BYPASS GRAFT FEMORAL-POPLITEAL ARTERY VS FEMORAL-TIBIAL ARTERY BYPASS;  Surgeon: Chuck Hint, MD;  Location: [facility] OR;  Service: Vascular;  Laterality: Left;  . I&D EXTREMITY Left 11/10/2015   Procedure: Debridement Left Foot Ulcer, Application  Wound VAC;  Surgeon: [name], MD;  Location: [facility] OR;  Service: Orthopedics;  Laterality: Left;  . ILIAC ARTERY  STENT Left 08/28/2015   common  . INTRAOPERATIVE ARTERIOGRAM Left 09/05/2015   Procedure: INTRA OPERATIVE ARTERIOGRAM;  Surgeon: Chuck Hint, MD;  Location: [facility] OR;  Service: Vascular;  Laterality: Left;  . INTRAOPERATIVE ARTERIOGRAM Left 11/02/2015   Procedure: INTRA OPERATIVE ARTERIOGRAM;  Surgeon: Chuck Hint, MD;  Location: [facility] OR;  Service: Vascular;  Laterality: Left;  . LEXISCAN MYOVIEW  10/25/2010   Moderate perfusion defect due to infarct/scar with mild perinfarct ischemia seen in the Basal Inferolateral, Basal Anterolateral, Mid Inferolateral, and Mid Anterolateral regions. Post-stress EF is 50%.  . LOWER EXTREMITY ANGIOGRAPHY N/A 10/17/2016   Procedure: Lower Extremity Angiography;  Surgeon: Runell Gess, MD;  Location: [facility] INVASIVE CV LAB;  Service: Cardiovascular;  Laterality: N/A;  . OVARY SURGERY  1983?   "ruptured"  . PERIPHERAL VASCULAR ANGIOGRAM  01/26/2010   High-grade SFA disease: left greater than right. Left SFA would require fem-pop bypass grafting. Right SFA could be stented but might require Diamondback Orbital atherectomy.  Marland Kitchen PERIPHERAL VASCULAR ANGIOGRAM  02/23/2010   Stealth Predator orbital rotational atherectomy performed on SFA & Popliteal up to 90,000 RPM. Stenting using overlapping 5x13mm and 5x38mm Absolute Pro Nitinol self-expanding stents beginning just at the knee up to the mid SFA resulting in reduction of 90-95% calcified SFA & Popliteal stenosis to 0. Stenting performed on the distal common & proximal iliac artery with a 10x4 Absolute Pro- 70-0%.  Marland Kitchen PERIPHERAL VASCULAR ANGIOGRAM  06/17/2010   PTA performed to the right external iliac artery stent using a 5x100 balloon at 10 atmospheres. Stenting performed using a 6x18 Genesis on Opta balloon. Postdilatation with a 7x2 balloon resulting in a 95% "in-stent" stenosis to 0% residual.  . PERIPHERAL VASCULAR ANGIOGRAM  06/04/2011   Bilateral total SFAs not percutaneously addressable. Good canidate  for femoropopliteal bypass grafting  . PERIPHERAL VASCULAR ANGIOGRAM  08/28/2015  . PERIPHERAL VASCULAR CATHETERIZATION N/A 08/28/2015   Procedure: Lower Extremity Angiography;  Surgeon: Runell Gess, MD;  Location: [facility] INVASIVE CV LAB;  Service: Cardiovascular;  Laterality: N/A;  . PERIPHERAL VASCULAR CATHETERIZATION N/A 08/28/2015   Procedure: Abdominal Aortogram;  Surgeon: Runell Gess, MD;  Location: [facility] INVASIVE CV LAB;  Service: Cardiovascular;  Laterality: N/A;  . PERIPHERAL VASCULAR CATHETERIZATION Left 08/28/2015   Procedure: Peripheral Vascular Intervention;  Surgeon: Runell Gess, MD;  Location: [facility] INVASIVE CV LAB;  Service: Cardiovascular;  Laterality: Left;  common iliac  . PERIPHERAL VASCULAR CATHETERIZATION Left 08/28/2015   Procedure: Peripheral Vascular Atherectomy;  Surgeon: Runell Gess, MD;  Location: [facility] INVASIVE CV LAB;  Service: Cardiovascular;  Laterality: Left;  common iliac  . PERIPHERAL VASCULAR CATHETERIZATION N/A 09/28/2015   Procedure: Lower Extremity Angiography;  Surgeon: Runell Gess, MD;  Location: [facility] INVASIVE CV LAB;  Service: Cardiovascular;  Laterality: N/A;  . PERIPHERAL VASCULAR CATHETERIZATION Left 09/28/2015   Procedure: Peripheral Vascular Intervention;  Surgeon: Runell Gess,  MD;  Location: [facility] INVASIVE CV LAB;  Service: Cardiovascular;  Laterality: Left CFA  PCI with 9 mm x 4 cm Abbott nitinol absolute Pro self-expanding stent     . SKIN SPLIT GRAFT Left 12/01/2015   Procedure: LEFT FOOT SKIN GRAFT AND VAC;  Surgeon: [name], MD;  Location: [facility] OR;  Service: Orthopedics;  Laterality: Left;  . TRANSTHORACIC ECHOCARDIOGRAM  10/17/2009   EF 45-50%, moderate hypokinesis of the entire inferolateral myocardium, mild concentric hypertrophy and mild regurg of the mitral valva.  Marland Kitchen VEIN HARVEST Left 11/02/2015   Procedure: LEFT GREATER SAPHENOUS VEIN HARVEST;  Surgeon: Chuck Hint, MD;  Location: [facility] OR;  Service: Vascular;  Laterality:  Left;     Allergies:   Allergies  Allergen Reactions  . Doxycycline Other (See Comments)    lethargy  . Hydrochlorothiazide Other (See Comments)    lethargic   . Latex Rash  . Penicillins Swelling and Rash    Pt states she has tolerated Keflex in the past without problems. States she may have tolerated Augmentin in the past but it caused GI upset. Has patient had a PCN reaction causing immediate rash, facial/tongue/throat swelling, SOB or lightheadedness with hypotension: Yes Has patient had a PCN reaction causing severe rash involving mucus membranes or skin necrosis: No Has patient had a PCN reaction that required hospitalization No Has patient had a PCN reaction occurring within the last 10 years: No     Social History:  reports that she quit smoking about 7 years ago. Her smoking use included Cigarettes. She has a 61.50 pack-year smoking history. She has never used smokeless tobacco. She reports that she does not drink alcohol or use drugs.   Family History: Family History  Problem Relation Age of Onset  . Hypertension Mother   . Heart failure Mother   . Heart failure Father   . Stroke Father   . Diabetes Father      Physical Exam: Vitals:   10/17/16 2134 10/17/16 2200 10/17/16 2219 10/17/16 2300  BP: (!) 144/61     Pulse: (!) 114 100 (!) 122 (!) 130  Resp: (!) 34 (!) 25 (!) 34 (!) 38  Temp:      TempSrc:      SpO2:   (!) 88%   Weight:      Height:        Constitutional: ,  Alert and awake, oriented x3, in obvious respiratory distress with RR of 40+, initially satting low 80s on 12L O2 via ventimask Eyes: PERLA, EOMI, irises appear normal, anicteric sclera,  ENMT: external ears and nose appear normal Lips appears normal, oropharynx mucosa, tongue, posterior pharynx appear normal  Neck: neck appears normal, no masses, normal ROM, no thyromegaly, no JVD  CVS: Tachycardic, regluar Respiratory:  Diffuse wheezing, rhonchi Abdomen: soft nontender, nondistended,  normal bowel sounds, no hepatosplenomegaly, no hernias  Musculoskeletal: : no cyanosis, clubbing or edema noted bilaterally Neuro: Cranial nerves II-XII intact, strength, sensation, reflexes Psych: judgement and insight appear normal, stable mood and affect, mental status Skin: no rashes or lesions or ulcers, no induration or nodules   Data reviewed:  I have personally reviewed following labs and imaging studies Labs:  CBC:  Recent Labs Lab 10/17/16 0634 10/17/16 2230  WBC 18.4* 21.1*  NEUTROABS  --  16.7*  HGB 9.5* 10.5*  HCT 28.0* 31.5*  MCV 85.1 86.1  PLT 337 429*    Basic Metabolic Panel:  Recent Labs Lab 10/17/16 0634  NA 138  K 4.9  CL 107  CO2 20*  GLUCOSE 82  BUN 27*  CREATININE 1.30*  CALCIUM 9.0   GFR Estimated Creatinine Clearance: 42.1 mL/min (A) (by C-G formula based on SCr of 1.3 mg/dL (H)). Liver Function Tests: No results for input(s): AST, ALT, ALKPHOS, BILITOT, PROT, ALBUMIN in the last 168 hours. No results for input(s): LIPASE, AMYLASE in the last 168 hours. No results for input(s): AMMONIA in the last 168 hours. Coagulation profile  Recent Labs Lab 10/17/16 0634  INR 1.23    Cardiac Enzymes: No results for input(s): CKTOTAL, CKMB, CKMBINDEX, TROPONINI in the last 168 hours. BNP: Invalid input(s): POCBNP CBG:  Recent Labs Lab 10/17/16 0607 10/17/16 0842 10/17/16 2038  GLUCAP 84 80 123*   D-Dimer No results for input(s): DDIMER in the last 72 hours. Hgb A1c No results for input(s): HGBA1C in the last 72 hours. Lipid Profile No results for input(s): CHOL, HDL, LDLCALC, TRIG, CHOLHDL, LDLDIRECT in the last 72 hours. Thyroid function studies No results for input(s): TSH, T4TOTAL, T3FREE, THYROIDAB in the last 72 hours.  Invalid input(s): FREET3 Anemia work up No results for input(s): VITAMINB12, FOLATE, FERRITIN, TIBC, IRON, RETICCTPCT in the last 72 hours. Urinalysis    Component Value Date/Time   COLORURINE YELLOW  09/05/2015 0641   APPEARANCEUR CLEAR 09/05/2015 0641   LABSPEC 1.013 09/05/2015 0641   PHURINE 6.0 09/05/2015 0641   GLUCOSEU 100 (A) 09/05/2015 0641   HGBUR TRACE (A) 09/05/2015 0641   BILIRUBINUR NEGATIVE 09/05/2015 0641   KETONESUR NEGATIVE 09/05/2015 0641   PROTEINUR 100 (A) 09/05/2015 0641   UROBILINOGEN 1.0 10/16/2009 0243   NITRITE NEGATIVE 09/05/2015 0641   LEUKOCYTESUR NEGATIVE 09/05/2015 0641     Microbiology No results found for this or any previous visit (from the past 240 hour(s)).     Inpatient Medications:   Scheduled Meds: . acetaminophen  650 mg Rectal Once  . [START ON 10/18/2016] aspirin  81 mg Oral Daily  . insulin aspart  0-15 Units Subcutaneous TID WC  . isosorbide mononitrate  90 mg Oral QHS  . sodium chloride flush  3 mL Intravenous Q12H   Continuous Infusions: . sodium chloride    . [START ON 10/18/2016] vancomycin       Radiological Exams on Admission: Dg Chest Port 1v Same Day  Result Date: 10/17/2016 CLINICAL DATA:  Shortness of breath EXAM: PORTABLE CHEST 1 VIEW COMPARISON:  05/31/2016 FINDINGS: Probable tiny right pleural effusion. Heart size within normal limits. Diffuse increased interstitial opacity, may reflect mild edema or interstitial inflammatory process. Aortic atherosclerosis. No pneumothorax. IMPRESSION: Possible tiny right effusion. Diffuse increased interstitial opacity, may reflect mild interstitial edema or infection. Electronically Signed   By: Jasmine Pang M.D.   On: 10/17/2016 21:07    Impression/Recommendations Principal Problem:   PAD (peripheral artery disease) (HCC) Active Problems:   Acute respiratory failure with hypoxia (HCC)   Gangrene of left foot (HCC)   Sepsis (HCC)  1. PAD - with gangrene of L forefoot.  Plan for fem-pop bypass was planned, but obviously likely to be on hold for the moment pending resolution and treatment of respiratory failure. 2. Sepsis - Foot is grossly gangrenous which is an obvious  source of her leukocytosis, tachycardia, fever now 101.8, patient may or may not also have PNA but unclear. 1. Adding Cefepime to vanc 2. Lactate pending 3. BCx ordered 3. Acute respiratory failure with hypoxia - Given profound respiratory distress with rates in the 40s, sats in 70s-80s, ordered  rescue BIPAP and requested PCCM eval. 1. Solumedrol given for wheezing 2. Patient stabilized for the moment on BIPAP and sating well 3. ABG pending 4. Patient currently being moved to ICU by PCCM at the moment 1. Notified Dr. Randie Heinz on phone 5. Appears to be flash pulmonary edema 1.  IV lasix given 2. Trop pending but no chest pain 3. EKG ? Rate dependent ST changes in lateral leads, but certianly not STEMI  Disposition: Patient being transferred to ICU and PCCM assuming medical care for the moment.   Time Spent: 80 min  Clarice Zulauf M. D.O. [facility] 10/17/2016, 11:28 PM

## 2016-10-17 NOTE — Interval H&P Note (Signed)
History and Physical Interval Note:  10/17/2016 7:39 AM  [name] [name]  has presented today for surgery, with the diagnosis of grangren  The various methods of treatment have been discussed with the patient and family. After consideration of risks, benefits and other options for treatment, the patient has consented to  Procedure(s): Lower Extremity Angiography (N/A) as a surgical intervention .  The patient's history has been reviewed, patient examined, no change in status, stable for surgery.  I have reviewed the patient's chart and labs.  Questions were answered to the patient's satisfaction.     Nanetta Batty

## 2016-10-17 NOTE — Progress Notes (Addendum)
Pharmacy Antibiotic Note  [name] [name] is a 66 y.o. female admitted on 10/17/2016 with gangrene of R foot. Pt has extensive history of lower limb ischemia including osteomyelitis, and is on Bactrim PTA now to begin vancomycin per pharmacy. CrCl ~4ml/min, pt previously subtherapeutic on 750mg  IV q12h.  Plan: -Vancomycin 1500mg  IV x1 -Vancomycin 1000mg  IV q12h -Monitor LOT, Cx, VVS plans -Obtain vancomycin level as indicated  Height: 5\' 4"  (162.6 cm) Weight: 164 lb (74.4 kg) IBW/kg (Calculated) : 54.7  Temp (24hrs), Avg:98.5 F (36.9 C), Min:98 F (36.7 C), Max:99 F (37.2 C)   Recent Labs Lab 10/17/16 0634  WBC 18.4*  CREATININE 1.30*    Estimated Creatinine Clearance: 42.1 mL/min (A) (by C-G formula based on SCr of 1.3 mg/dL (H)).    Allergies  Allergen Reactions  . Doxycycline Other (See Comments)    lethargy  . Hydrochlorothiazide Other (See Comments)    lethargic   . Latex Rash  . Penicillins Swelling and Rash    Pt states she has tolerated Keflex in the past without problems. States she may have tolerated Augmentin in the past but it caused GI upset. Has patient had a PCN reaction causing immediate rash, facial/tongue/throat swelling, SOB or lightheadedness with hypotension: Yes Has patient had a PCN reaction causing severe rash involving mucus membranes or skin necrosis: No Has patient had a PCN reaction that required hospitalization No Has patient had a PCN reaction occurring within the last 10 years: No    Antimicrobials this admission: 9/20 Vancomycin >>   Dose adjustments this admission: n/a  Microbiology results: none  Thank you for allowing pharmacy to be a part of this patient's care.  Fredonia Highland, PharmD PGY-2 Cardiology Pharmacy Resident Pager: [phone] 10/17/2016

## 2016-10-17 NOTE — Progress Notes (Signed)
Right Lower Extremity Vein Map    Right Great Saphenous Vein   Segment Diameter Comment  1. Origin 4.76mm Tortuous  2. High Thigh 4.49mm Tortuous, branch  3. Mid Thigh 4.64mm Tortuous, branch  4. Low Thigh 3.45mm Tortuous  5. At Knee 3.27mm Tortuous  6. High Calf 3.76mm Tortuous  7. Low Calf 47mm Tortuous, branch  8. Ankle 2.42mm Tortuous   mm    mm    mm      Left GSV appears to have been previously harvested

## 2016-10-18 ENCOUNTER — Inpatient Hospital Stay (HOSPITAL_COMMUNITY): Payer: Medicare Other

## 2016-10-18 ENCOUNTER — Encounter (HOSPITAL_COMMUNITY)
Admission: RE | Disposition: A | Discharge status: Skilled Nursing Facility | Payer: Self-pay | Source: Ambulatory Visit | Attending: Vascular Surgery

## 2016-10-18 ENCOUNTER — Telehealth (INDEPENDENT_AMBULATORY_CARE_PROVIDER_SITE_OTHER): Payer: Self-pay | Admitting: Orthopedic Surgery

## 2016-10-18 DIAGNOSIS — I96 Gangrene, not elsewhere classified: Secondary | ICD-10-CM

## 2016-10-18 DIAGNOSIS — I340 Nonrheumatic mitral (valve) insufficiency: Secondary | ICD-10-CM

## 2016-10-18 DIAGNOSIS — Z01810 Encounter for preprocedural cardiovascular examination: Secondary | ICD-10-CM

## 2016-10-18 DIAGNOSIS — I739 Peripheral vascular disease, unspecified: Secondary | ICD-10-CM

## 2016-10-18 LAB — BASIC METABOLIC PANEL
ANION GAP: 10 (ref 5–15)
Anion gap: 17 — ABNORMAL HIGH (ref 5–15)
BUN: 20 mg/dL (ref 6–20)
BUN: 25 mg/dL — ABNORMAL HIGH (ref 6–20)
CALCIUM: 8.8 mg/dL — AB (ref 8.9–10.3)
CHLORIDE: 105 mmol/L (ref 101–111)
CHLORIDE: 107 mmol/L (ref 101–111)
CO2: 15 mmol/L — AB (ref 22–32)
CO2: 19 mmol/L — AB (ref 22–32)
Calcium: 9.2 mg/dL (ref 8.9–10.3)
Creatinine, Ser: 1.16 mg/dL — ABNORMAL HIGH (ref 0.44–1.00)
Creatinine, Ser: 1.32 mg/dL — ABNORMAL HIGH (ref 0.44–1.00)
GFR calc Af Amer: 56 mL/min — ABNORMAL LOW (ref >60)
GFR calc non Af Amer: 48 mL/min — ABNORMAL LOW (ref >60)
GFR calc non Af Amer: 41 mL/min — ABNORMAL LOW (ref >60)
GFR, EST AFRICAN AMERICAN: 48 mL/min — AB (ref >60)
Glucose, Bld: 167 mg/dL — ABNORMAL HIGH (ref 65–99)
Glucose, Bld: 219 mg/dL — ABNORMAL HIGH (ref 65–99)
POTASSIUM: 5.1 mmol/L (ref 3.5–5.1)
POTASSIUM: 5.4 mmol/L — AB (ref 3.5–5.1)
SODIUM: 137 mmol/L (ref 135–145)
Sodium: 136 mmol/L (ref 135–145)

## 2016-10-18 LAB — GLUCOSE, CAPILLARY
GLUCOSE-CAPILLARY: 289 mg/dL — AB (ref 65–99)
GLUCOSE-CAPILLARY: 277 mg/dL — AB (ref 65–99)
GLUCOSE-CAPILLARY: 197 mg/dL — AB (ref 65–99)
Glucose-Capillary: 140 mg/dL — ABNORMAL HIGH (ref 65–99)
Glucose-Capillary: 169 mg/dL — ABNORMAL HIGH (ref 65–99)
Glucose-Capillary: 236 mg/dL — ABNORMAL HIGH (ref 65–99)

## 2016-10-18 LAB — BLOOD GAS, ARTERIAL
Acid-base deficit: 5.4 mmol/L — ABNORMAL HIGH (ref 0.0–2.0)
Bicarbonate: 18.9 mmol/L — ABNORMAL LOW (ref 20.0–28.0)
DELIVERY SYSTEMS: POSITIVE
Drawn by: 25203
EXPIRATORY PAP: 8
FIO2: 60
Inspiratory PAP: 15
O2 Saturation: 96.1 %
PATIENT TEMPERATURE: 98.6
PH ART: 7.376 (ref 7.350–7.450)
pCO2 arterial: 32.9 mmHg (ref 32.0–48.0)
pO2, Arterial: 86.9 mmHg (ref 83.0–108.0)

## 2016-10-18 LAB — PROCALCITONIN: Procalcitonin: 0.1 ng/mL

## 2016-10-18 LAB — CBC
HEMATOCRIT: 27 % — AB (ref 36.0–46.0)
HEMOGLOBIN: 8.9 g/dL — AB (ref 12.0–15.0)
MCH: 28.3 pg (ref 26.0–34.0)
MCHC: 33 g/dL (ref 30.0–36.0)
MCV: 85.7 fL (ref 78.0–100.0)
Platelets: 308 10*3/uL (ref 150–400)
RBC: 3.15 MIL/uL — AB (ref 3.87–5.11)
RDW: 13.8 % (ref 11.5–15.5)
WBC: 14.2 10*3/uL — ABNORMAL HIGH (ref 4.0–10.5)

## 2016-10-18 LAB — TROPONIN I
TROPONIN I: 0.04 ng/mL — AB (ref <0.03)
Troponin I: 0.03 ng/mL (ref <0.03)
Troponin I: 0.06 ng/mL (ref <0.03)

## 2016-10-18 LAB — ECHOCARDIOGRAM COMPLETE
Height: 64 in
WEIGHTICAEL: 2772.5 [oz_av]

## 2016-10-18 LAB — BRAIN NATRIURETIC PEPTIDE: B NATRIURETIC PEPTIDE 5: 1176.9 pg/mL — AB (ref 0.0–100.0)

## 2016-10-18 LAB — LACTIC ACID, PLASMA: LACTIC ACID, VENOUS: 0.7 mmol/L (ref 0.5–1.9)

## 2016-10-18 LAB — PREPARE RBC (CROSSMATCH)

## 2016-10-18 SURGERY — BYPASS GRAFT FEMORAL-POPLITEAL ARTERY
Anesthesia: General | Laterality: Right

## 2016-10-18 MED ORDER — INSULIN ASPART 100 UNIT/ML ~~LOC~~ SOLN
0.0000 [IU] | SUBCUTANEOUS | Status: DC
  Administered 2016-10-18: 7 [IU] via SUBCUTANEOUS
  Administered 2016-10-18: 11 [IU] via SUBCUTANEOUS
  Administered 2016-10-18: 4 [IU] via SUBCUTANEOUS
  Administered 2016-10-19: 3 [IU] via SUBCUTANEOUS
  Administered 2016-10-19: 4 [IU] via SUBCUTANEOUS
  Administered 2016-10-19 – 2016-10-20 (×3): 3 [IU] via SUBCUTANEOUS
  Administered 2016-10-21: 4 [IU] via SUBCUTANEOUS
  Administered 2016-10-21 (×2): 3 [IU] via SUBCUTANEOUS
  Administered 2016-10-21: 7 [IU] via SUBCUTANEOUS
  Administered 2016-10-21: 4 [IU] via SUBCUTANEOUS
  Administered 2016-10-22: 7 [IU] via SUBCUTANEOUS
  Administered 2016-10-23: 4 [IU] via SUBCUTANEOUS
  Administered 2016-10-23 (×2): 3 [IU] via SUBCUTANEOUS
  Administered 2016-10-23: 4 [IU] via SUBCUTANEOUS
  Administered 2016-10-23 (×2): 3 [IU] via SUBCUTANEOUS

## 2016-10-18 MED ORDER — PROPOFOL 10 MG/ML IV BOLUS
INTRAVENOUS | Status: AC
  Filled 2016-10-18: qty 20

## 2016-10-18 MED ORDER — FENTANYL CITRATE (PF) 250 MCG/5ML IJ SOLN
INTRAMUSCULAR | Status: AC
  Filled 2016-10-18: qty 5

## 2016-10-18 MED ORDER — DEXAMETHASONE SODIUM PHOSPHATE 10 MG/ML IJ SOLN
INTRAMUSCULAR | Status: AC
  Filled 2016-10-18: qty 1

## 2016-10-18 MED ORDER — OXYCODONE HCL 5 MG/5ML PO SOLN: 5.0000 mg | Freq: Once | ORAL | Status: AC | PRN

## 2016-10-18 MED ORDER — METOPROLOL SUCCINATE ER 25 MG PO TB24
75.0000 mg | ORAL_TABLET | Freq: Every day | ORAL | Status: DC
  Administered 2016-10-18 – 2016-10-28 (×10): 75 mg via ORAL
  Filled 2016-10-18 (×10): qty 1

## 2016-10-18 MED ORDER — ONDANSETRON HCL 4 MG/2ML IJ SOLN: 4.0000 mg | Freq: Four times a day (QID) | INTRAMUSCULAR | Status: DC | PRN

## 2016-10-18 MED ORDER — ROCURONIUM BROMIDE 10 MG/ML (PF) SYRINGE
PREFILLED_SYRINGE | INTRAVENOUS | Status: AC
  Filled 2016-10-18: qty 5

## 2016-10-18 MED ORDER — SUCCINYLCHOLINE CHLORIDE 200 MG/10ML IV SOSY
PREFILLED_SYRINGE | INTRAVENOUS | Status: AC
  Filled 2016-10-18: qty 10

## 2016-10-18 MED ORDER — DEXTROSE 5 % IV SOLN
2.0000 g | INTRAVENOUS | Status: DC
  Administered 2016-10-19 – 2016-10-25 (×8): 2 g via INTRAVENOUS
  Filled 2016-10-18 (×9): qty 2

## 2016-10-18 MED ORDER — VANCOMYCIN HCL IN DEXTROSE 750-5 MG/150ML-% IV SOLN
750.0000 mg | Freq: Two times a day (BID) | INTRAVENOUS | Status: DC
  Administered 2016-10-18 – 2016-10-26 (×15): 750 mg via INTRAVENOUS
  Filled 2016-10-18 (×18): qty 150

## 2016-10-18 MED ORDER — INSULIN DETEMIR 100 UNIT/ML ~~LOC~~ SOLN
10.0000 [IU] | Freq: Every day | SUBCUTANEOUS | Status: DC
  Administered 2016-10-18 – 2016-10-27 (×10): 10 [IU] via SUBCUTANEOUS
  Filled 2016-10-18 (×12): qty 0.1

## 2016-10-18 MED ORDER — OXYCODONE HCL 5 MG PO TABS
5.0000 mg | ORAL_TABLET | Freq: Once | ORAL | Status: AC | PRN
  Administered 2016-10-20: 5 mg via ORAL
  Filled 2016-10-18: qty 1

## 2016-10-18 MED ORDER — FUROSEMIDE 10 MG/ML IJ SOLN
40.0000 mg | Freq: Once | INTRAMUSCULAR | Status: AC
  Administered 2016-10-18: 40 mg via INTRAVENOUS

## 2016-10-18 MED ORDER — HYDROMORPHONE HCL 1 MG/ML IJ SOLN: 0.2500 mg | INTRAMUSCULAR | Status: DC | PRN

## 2016-10-18 MED ORDER — FUROSEMIDE 10 MG/ML IJ SOLN
40.0000 mg | Freq: Once | INTRAMUSCULAR | Status: AC
  Administered 2016-10-18: 40 mg via INTRAVENOUS
  Filled 2016-10-18: qty 4

## 2016-10-18 MED ORDER — MIDAZOLAM HCL 2 MG/2ML IJ SOLN
INTRAMUSCULAR | Status: AC
  Filled 2016-10-18: qty 2

## 2016-10-18 MED ORDER — LIDOCAINE 2% (20 MG/ML) 5 ML SYRINGE
INTRAMUSCULAR | Status: AC
  Filled 2016-10-18: qty 5

## 2016-10-18 MED ORDER — SUGAMMADEX SODIUM 200 MG/2ML IV SOLN
INTRAVENOUS | Status: AC
Start: 2016-10-18 — End: 2016-10-18
  Filled 2016-10-18: qty 2

## 2016-10-18 MED ORDER — ONDANSETRON HCL 4 MG/2ML IJ SOLN
INTRAMUSCULAR | Status: AC
  Filled 2016-10-18: qty 2

## 2016-10-18 MED ORDER — MORPHINE SULFATE (PF) 2 MG/ML IV SOLN
2.0000 mg | Freq: Once | INTRAVENOUS | Status: AC
  Administered 2016-10-18: 2 mg via INTRAVENOUS
  Filled 2016-10-18: qty 1

## 2016-10-18 NOTE — Progress Notes (Signed)
Inpatient Diabetes Program Recommendations  AACE/ADA: New Consensus Statement on Inpatient Glycemic Control (2015)  Target Ranges:  Prepandial:   less than 140 mg/dL      Peak postprandial:   less than 180 mg/dL (1-2 hours)      Critically ill patients:  140 - 180 mg/dL   Results for ABIGAILROSE, NATALIE (MRN [id number]) as of 10/18/2016 11:29  Ref. Range 10/17/2016 06:07 10/17/2016 08:42 10/17/2016 20:38 10/17/2016 23:29 10/18/2016 00:26  Glucose-Capillary Latest Ref Range: 65 - 99 mg/dL 84 80 951 (H) 884 (H) 166 (H)   Results for KRISTEE, HOLNESS (MRN [id number]) as of 10/18/2016 11:29  Ref. Range 10/18/2016 07:55 10/18/2016 11:16  Glucose-Capillary Latest Ref Range: 65 - 99 mg/dL 932 (H) 355 (H)    Home DM Meds: Levemir 30 units daily       Metformin 1000 mg BID  Current Insulin Orders: Levemir 10 units QHS      Novolog Resistant Correction Scale/ SSI (0-20 units) Q4 hours      MD- Note patient received Solumedrol 60 mg X 1 dose last night at Midnight for breathing issues.  CBGs elevated >200 mg/dl today.  Patient takes Levemir at home (30 units daily).  Note Levemir 10 units QHS to start tonight and Novolog SSI increased to Resistant scale as well.  If patient remains elevated tomorrow AM, please consider the following:  1. Increase Levemir to 20 units QHS (2/3 total home dose)  2. Change/Reduce Novolog SSI to Moderate scale (0-15 units) TID AC + HS (patient eating PO diet)  3. Start Novolog Meal Coverage- Novolog 3 units TID with meals (hold if pt eats <50% of meal)     --Will follow patient during hospitalization--  Ambrose Finland RN, MSN, CDE Diabetes Coordinator Inpatient Glycemic Control Team Team Pager: [phone] (8a-5p)

## 2016-10-18 NOTE — Consult Note (Signed)
Cardiology Consultation:   Patient ID: [name] [name]; [id number]; [birth year]   Admit date: 10/17/2016 Date of Consult: 10/18/2016  Primary Care Provider: Georgann Housekeeper, MD Primary Cardiologist: Dr. Tresa Endo   Patient Profile:   [name] [name] is a 66 y.o. female with a hx of Type 2 diabetes, stroke, PAD, MI in 2011 requiring DES to circumflex, hypertension, hyperlipidemia, GERD, CHF, chronically occluded right carotid artery who is being seen today for the preoperative evaluation prior to vascular surgery and CHF at the request of Dr. Edilia Bo.  History of Present Illness:   [name] [name] has a history of a ST segment elevation MI in September 2011 secondary to total left circumflex occlusion. She had a DES placed in the circumflex and PTCA of diffuse disease distally. She has significant peripheral vascular disease and is status post prior intervention by Dr. Allyson Sabal. She required a left femoral to below the knee popliteal artery bypass and also had infection requiring debridement in 10/2015. She has had issues with diastolic heart failure. Her EF is 50-55%. She is on high intensity statin with atorvastatin and treatment for her PVD/CAD with a target of less than 70. She takes aspirin and Plavix. She is diabetic on metformin. Her reflux is controlled with Protonix.  The patient was last seen in the office by Dr. Tresa Endo on 06/11/16 at which time she has no significant findings.  She has successfully quit smoking.  The patient had a toenail removed from her right foot about a month ago and now her right first second and third toes have dry and wet gangrene and foul odor. She also has an open wound to the right lower leg. She now has critical limb ischemia of the right foot as a result of instrumentation several weeks ago. She was referred to Dr. Allyson Sabal by Dr. Edilia Bo for angiography to determine suitability for popliteal surgical revascularization. She has diffuse multilevel arterial occlusive disease with an  occluded common femoral artery and superficial femoral artery. Dr. Edilia Bo had planned for right common femoral artery endarterectomy in femoral-popliteal bypass grafting on 9/21, but on the evening of 9/20 the patient became acutely short of breath, febrile 101.8, tachycardic and hypoxic requiring BiPAP. Pulmonary edema versus PE versus pneumonia. Patient was given IV Lasix, Solu-Medrol, breathing treatments and she has improved. Her surgery has been postponed to probably next Tuesday.  Today the patient is breathing better but not back to normal. She has no edema. She had chest heaviness last night during her breathing episode. Today she has a vague remnant of that heaviness. Prior to this hospitalization she was not having dyspnea, chest pain, orthopnea, PND, palpitations or lightheadedness.  We are asked to see the patient regarding her cardiovascular status for the perioperative period.   Her last stress test was in 2012 and was low risk. She was scheduled for a stress test in May of this year but canceled.  Past Medical History:  Diagnosis Date  . Anemia 10/2015   Acute Blood Loss  . Arthritis    "feel like I have it all over" (08/28/2015)  . CHF (congestive heart failure) (HCC)   . Complication of anesthesia    DIFFICULT WAKING "only when I was smoking; no problems since I quit"  . Coronary artery disease   . Family history of adverse reaction to anesthesia    sister slow to wake up  . GERD (gastroesophageal reflux disease)    takes Protonix daily   . Hip bursitis   . History  of blood transfusion    10/2015  . Hyperlipidemia LDL goal < 70 06/28/2013   takes Atorvastatin daily  . Hypertension    takes Metoprolol and Imdur daily  . Malnutrition (HCC)   . Migraine    "none in a long time" (08/28/2015)  . Myocardial infarction (HCC) 2011  . Neuromuscular disorder (HCC)    DIABETIC NEUROPATHY  . Osteomyelitis (HCC) 2017   Left foot  . PAD (peripheral artery disease) (HCC)   .  Peripheral vascular disease (HCC)   . Respiratory failure (HCC) 10/2015   Acute Hypoxia- acute pulmonary edema 11/13/2015  . Septic shock (HCC) 10/2015  . Stroke (HCC)   . Type II diabetes mellitus (HCC)    takes Lantus nightly.Average fasting blood sugar runs 80-90  Type II    Past Surgical History:  Procedure Laterality Date  . ABDOMINAL HYSTERECTOMY    . APPENDECTOMY    . ATHERECTOMY N/A 06/04/2011   Procedure: ATHERECTOMY;  Surgeon: Runell Gess, MD;  Location: [facility] CATH LAB;  Service: Cardiovascular;  Laterality: N/A;  . CARDIAC CATHETERIZATION  10/13/2009   95% stenosis in the AV groove circumflex and 95% ostial stenosis in small OM3. A 3x89mm drug-eluting Promus stent inserted ito the circumflex. Dilatated with a 3.25x57mm noncompliant Quantum balloon within entire segment. The entire region was reduced to 0% and brisk TIMI3 flow.  . CAROTID DUPLEX  03/19/2011   Right ICA-demonstrates complete occlusion. Left ICA-demonstrates a small amount of fibrous plaque.  Marland Kitchen CATARACT EXTRACTION W/ INTRAOCULAR LENS IMPLANT Right   . CESAREAN SECTION  1990  . CORONARY ANGIOPLASTY    . ENDARTERECTOMY FEMORAL Left 09/05/2015   Procedure: ENDARTERECTOMY FEMORAL WITH PROFUNDOPLASTY;  Surgeon: Chuck Hint, MD;  Location: [facility] OR;  Service: Vascular;  Laterality: Left;  Left common femoral artery vein patch using left saphenous vien  . FEMORAL-POPLITEAL BYPASS GRAFT Left 11/02/2015   Procedure: BYPASS GRAFT FEMORAL-POPLITEAL ARTERY VS FEMORAL-TIBIAL ARTERY BYPASS;  Surgeon: Chuck Hint, MD;  Location: [facility] OR;  Service: Vascular;  Laterality: Left;  . I&D EXTREMITY Left 11/10/2015   Procedure: Debridement Left Foot Ulcer, Application  Wound VAC;  Surgeon: [name], MD;  Location: [facility] OR;  Service: Orthopedics;  Laterality: Left;  . ILIAC ARTERY STENT Left 08/28/2015   common  . INTRAOPERATIVE ARTERIOGRAM Left 09/05/2015   Procedure: INTRA OPERATIVE ARTERIOGRAM;  Surgeon: Chuck Hint, MD;  Location: [facility] OR;  Service: Vascular;  Laterality: Left;  . INTRAOPERATIVE ARTERIOGRAM Left 11/02/2015   Procedure: INTRA OPERATIVE ARTERIOGRAM;  Surgeon: Chuck Hint, MD;  Location: [facility] OR;  Service: Vascular;  Laterality: Left;  . LEXISCAN MYOVIEW  10/25/2010   Moderate perfusion defect due to infarct/scar with mild perinfarct ischemia seen in the Basal Inferolateral, Basal Anterolateral, Mid Inferolateral, and Mid Anterolateral regions. Post-stress EF is 50%.  . LOWER EXTREMITY ANGIOGRAPHY N/A 10/17/2016   Procedure: Lower Extremity Angiography;  Surgeon: Runell Gess, MD;  Location: [facility] INVASIVE CV LAB;  Service: Cardiovascular;  Laterality: N/A;  . OVARY SURGERY  1983?   "ruptured"  . PERIPHERAL VASCULAR ANGIOGRAM  01/26/2010   High-grade SFA disease: left greater than right. Left SFA would require fem-pop bypass grafting. Right SFA could be stented but might require Diamondback Orbital atherectomy.  Marland Kitchen PERIPHERAL VASCULAR ANGIOGRAM  02/23/2010   Stealth Predator orbital rotational atherectomy performed on SFA & Popliteal up to 90,000 RPM. Stenting using overlapping 5x155mm and 5x52mm Absolute Pro Nitinol self-expanding stents beginning just at the knee up to  the mid SFA resulting in reduction of 90-95% calcified SFA & Popliteal stenosis to 0. Stenting performed on the distal common & proximal iliac artery with a 10x4 Absolute Pro- 70-0%.  Marland Kitchen PERIPHERAL VASCULAR ANGIOGRAM  06/17/2010   PTA performed to the right external iliac artery stent using a 5x100 balloon at 10 atmospheres. Stenting performed using a 6x18 Genesis on Opta balloon. Postdilatation with a 7x2 balloon resulting in a 95% "in-stent" stenosis to 0% residual.  . PERIPHERAL VASCULAR ANGIOGRAM  06/04/2011   Bilateral total SFAs not percutaneously addressable. Good canidate for femoropopliteal bypass grafting  . PERIPHERAL VASCULAR ANGIOGRAM  08/28/2015  . PERIPHERAL VASCULAR CATHETERIZATION N/A 08/28/2015    Procedure: Lower Extremity Angiography;  Surgeon: Runell Gess, MD;  Location: [facility] INVASIVE CV LAB;  Service: Cardiovascular;  Laterality: N/A;  . PERIPHERAL VASCULAR CATHETERIZATION N/A 08/28/2015   Procedure: Abdominal Aortogram;  Surgeon: Runell Gess, MD;  Location: [facility] INVASIVE CV LAB;  Service: Cardiovascular;  Laterality: N/A;  . PERIPHERAL VASCULAR CATHETERIZATION Left 08/28/2015   Procedure: Peripheral Vascular Intervention;  Surgeon: Runell Gess, MD;  Location: [facility] INVASIVE CV LAB;  Service: Cardiovascular;  Laterality: Left;  common iliac  . PERIPHERAL VASCULAR CATHETERIZATION Left 08/28/2015   Procedure: Peripheral Vascular Atherectomy;  Surgeon: Runell Gess, MD;  Location: [facility] INVASIVE CV LAB;  Service: Cardiovascular;  Laterality: Left;  common iliac  . PERIPHERAL VASCULAR CATHETERIZATION N/A 09/28/2015   Procedure: Lower Extremity Angiography;  Surgeon: Runell Gess, MD;  Location: [facility] INVASIVE CV LAB;  Service: Cardiovascular;  Laterality: N/A;  . PERIPHERAL VASCULAR CATHETERIZATION Left 09/28/2015   Procedure: Peripheral Vascular Intervention;  Surgeon: Runell Gess, MD;  Location: [facility] INVASIVE CV LAB;  Service: Cardiovascular;  Laterality: Left CFA  PCI with 9 mm x 4 cm Abbott nitinol absolute Pro self-expanding stent     . SKIN SPLIT GRAFT Left 12/01/2015   Procedure: LEFT FOOT SKIN GRAFT AND VAC;  Surgeon: [name], MD;  Location: [facility] OR;  Service: Orthopedics;  Laterality: Left;  . TRANSTHORACIC ECHOCARDIOGRAM  10/17/2009   EF 45-50%, moderate hypokinesis of the entire inferolateral myocardium, mild concentric hypertrophy and mild regurg of the mitral valva.  Marland Kitchen VEIN HARVEST Left 11/02/2015   Procedure: LEFT GREATER SAPHENOUS VEIN HARVEST;  Surgeon: Chuck Hint, MD;  Location: [facility] OR;  Service: Vascular;  Laterality: Left;     Home Medications:  Prior to Admission medications   Medication Sig Start Date End Date Taking? Authorizing Provider    acetaminophen (TYLENOL) 500 MG tablet Take 1,000 mg by mouth every 6 (six) hours as needed for mild pain.   Yes [provider]  amLODipine (NORVASC) 5 MG tablet TAKE 1 TABLET(5 MG) BY MOUTH DAILY 08/05/16  Yes Runell Gess, MD  aspirin EC 81 MG tablet Take 81 mg by mouth daily.   Yes [provider]  atorvastatin (LIPITOR) 80 MG tablet TAKE 1 TABLET(80 MG) BY MOUTH DAILY 08/27/16  Yes Lennette Bihari, MD  clopidogrel (PLAVIX) 75 MG tablet TAKE 1 TABLET(75 MG TOTAL) BY MOUTH DAILY. HOLD FOR VASCULAR SURGERY, RESTART AS SOON AS POSSIBLE AFTER LEG SURGERY(AFTER 8/8) 09/23/16  Yes Azalee Course, PA  furosemide (LASIX) 40 MG tablet Take 40 mg by mouth daily.  05/31/16  Yes [provider]  gabapentin (NEURONTIN) 300 MG capsule Take 300 mg by mouth 3 (three) times daily.  10/10/15  Yes [provider]  isosorbide mononitrate (IMDUR) 30 MG 24 hr tablet 30 mg. Take  1 tablet along by mouth with 60 mg to equal 90 mg daily.   Yes [provider]  isosorbide mononitrate (IMDUR) 60 MG 24 hr tablet 60 mg. Take 1 tablet by mouth along with 30 mg to equal 90 mg daily   Yes [provider]  LEVEMIR FLEXTOUCH 100 UNIT/ML Pen INJECT 30 UNITS INTO THE SKIN HS UTD. DISCONTINUE LANTUS 03/15/16  Yes [provider]  losartan (COZAAR) 100 MG tablet TAKE 1 TABLET BY MOUTH EVERY DAY Patient taking differently: TAKE 100 mg BY MOUTH EVERY DAY 06/17/16  Yes Lennette Bihari, MD  metFORMIN (GLUCOPHAGE) 1000 MG tablet Take 1,000 mg by mouth 2 (two) times daily with a meal.   Yes [provider]  metoprolol succinate (TOPROL-XL) 50 MG 24 hr tablet TAKE 1 AND 1/2 TABLET BY MOUTH DAILY Patient taking differently: Take 75 mg by mouth daily.  06/11/16  Yes Lennette Bihari, MD  pantoprazole (PROTONIX) 40 MG tablet TAKE 1 TABLET BY MOUTH DAILY Patient taking differently: TAKE 40 mg  BY MOUTH DAILY 10/02/16  Yes Lennette Bihari, MD  potassium chloride (K-DUR,KLOR-CON) 10 MEQ  tablet Take 10 mEq by mouth daily.  05/31/16  Yes [provider]  sulfamethoxazole-trimethoprim (BACTRIM,SEPTRA) 400-80 MG tablet Take 1 tablet by mouth 2 (two) times daily.   Yes [provider]  BD PEN NEEDLE NANO U/F 32G X 4 MM MISC U UTD D 07/30/16   [provider]  ONE TOUCH ULTRA TEST test strip USE TO TEST EVERY DAY AS DIRECTED 02/09/16   [provider]    Inpatient Medications: Scheduled Meds: . aspirin  81 mg Oral Daily  . insulin aspart  0-20 Units Subcutaneous Q4H  . insulin detemir  10 Units Subcutaneous QHS  . isosorbide mononitrate  90 mg Oral QHS  . sodium chloride flush  3 mL Intravenous Q12H   Continuous Infusions: . sodium chloride    . [START ON 10/19/2016] ceFEPime (MAXIPIME) IV    . vancomycin     PRN Meds: sodium chloride, acetaminophen, ipratropium, levalbuterol, morphine injection, ondansetron (ZOFRAN) IV, sodium chloride flush  Allergies:    Allergies  Allergen Reactions  . Doxycycline Other (See Comments)    lethargy  . Hydrochlorothiazide Other (See Comments)    lethargic   . Latex Rash  . Penicillins Swelling and Rash    Pt states she has tolerated Keflex in the past without problems. States she may have tolerated Augmentin in the past but it caused GI upset. Has patient had a PCN reaction causing immediate rash, facial/tongue/throat swelling, SOB or lightheadedness with hypotension: Yes Has patient had a PCN reaction causing severe rash involving mucus membranes or skin necrosis: No Has patient had a PCN reaction that required hospitalization No Has patient had a PCN reaction occurring within the last 10 years: No    Social History:   Social History   Social History  . Marital status: Married    Spouse name: N/A  . Number of children: N/A  . Years of education: N/A   Occupational History  . retired    Social History Main Topics  . Smoking status: Former Smoker    Packs/day: 1.50    Years: 41.00     Types: Cigarettes    Quit date: 10/12/2009  . Smokeless tobacco: Never Used  . Alcohol use No  . Drug use: No  . Sexual activity: Not Currently    Birth control/ protection: Surgical   Other Topics Concern  .  Not on file   Social History Narrative  . No narrative on file    Family History:    Family History  Problem Relation Age of Onset  . Hypertension Mother   . Heart failure Mother   . Heart failure Father   . Stroke Father   . Diabetes Father      ROS:  Please see the history of present illness.  ROS  All other ROS reviewed and negative.     Physical Exam/Data:   Vitals:   10/18/16 1130 10/18/16 1200 10/18/16 1300 10/18/16 1400  BP:  (!) 122/59 123/72 122/61  Pulse: 95 93 94 (!) 105  Resp: 15 18 19  (!) 26  Temp:      TempSrc:      SpO2: 98% 98% 97% 96%  Weight:      Height:        Intake/Output Summary (Last 24 hours) at 10/18/16 1442 Last data filed at 10/18/16 1400  Gross per 24 hour  Intake              520 ml  Output             1625 ml  Net            -1105 ml   Filed Weights   10/17/16 0602 10/18/16 0000  Weight: 164 lb (74.4 kg) 173 lb 4.5 oz (78.6 kg)   Body mass index is 29.74 kg/m.  General:  Well nourished, well developed, in no acute distress HEENT: normal Lymph: no adenopathy Neck: no JVD Endocrine:  No thryomegaly Vascular: No carotid bruits; FA pulses 2+ bilaterally without bruits  Cardiac:  normal S1, S2; RRR; no murmur, gallop present Lungs:  clear to auscultation bilaterally, no wheezing, rhonchi, few basilar rales Abd: soft, nontender, no hepatomegaly  Ext: no edema Musculoskeletal:  No deformities, BUE and BLE strength normal and equal Skin: warm and dry  Neuro:  CNs 2-12 intact, no focal abnormalities noted Psych:  Normal affect   EKG:  The EKG was personally reviewed and demonstrates:  Sinus rhythm at 89 bpm, no ischemic changes. Last night EKG showed ST at 125 bpm with non-specific ST changes. Telemetry:  Telemetry was  personally reviewed and demonstrates:  SR/ST in the 90's-110's  Relevant CV Studies:  Echocardiogram 11/08/2015 Study Conclusions  - Left ventricle: The cavity size was normal. Wall thickness was   normal. Systolic function was normal. The estimated ejection   fraction was in the range of 50% to 55%. There is akinesis of the   basal-midinferoseptal myocardium. Doppler parameters are   consistent with abnormal left ventricular relaxation (grade 1   diastolic dysfunction). - Mitral valve: Calcified annulus. Mildly thickened leaflets . - Left atrium: The atrium was mildly dilated.  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 Heterogeneous plaque in the LICA. Stable, 1-39% LICA stenosis. >50% LECA stenosis. Patent vertebral arteries with antegrade flow. Elevated right subclavian artery velocities. Normal left subclavian artery. No significant change from prior study. Follow-up in one year  Most recent stress test was on 10/25/2010  Moderate perfusion defect due to infarct/scar with mild peri-infarct ischemia seen in the basal inferolateral, basal anterior lateral, mid inferior lateral and mid anterolateral regions. The post stress left ventricle is normal in size.   The post stress EF is 50%. Global left ventricular systolic function is mildly reduced. Wall motion abnormalities  Compared to  the previous study there is no significant change. Abnormal myocardial perfusion study. This is a low risk scan.   Laboratory Data:  Chemistry Recent Labs Lab 10/17/16 0634 10/17/16 2230 10/18/16 0355  NA 138 137 136  K 4.9 5.1 5.4*  CL 107 105 107  CO2 20* 15* 19*  GLUCOSE 82 167* 219*  BUN 27* 20 25*  CREATININE 1.30* 1.16* 1.32*  CALCIUM 9.0 9.2 8.8*  GFRNONAA 42* 48* 41*  GFRAA 48* 56* 48*  ANIONGAP 11 17* 10    No results for input(s): PROT, ALBUMIN, AST, ALT, ALKPHOS,  BILITOT in the last 168 hours. Hematology Recent Labs Lab 10/17/16 0634 10/17/16 2230 10/18/16 0355  WBC 18.4* 21.1* 14.2*  RBC 3.29* 3.66* 3.15*  HGB 9.5* 10.5* 8.9*  HCT 28.0* 31.5* 27.0*  MCV 85.1 86.1 85.7  MCH 28.9 28.7 28.3  MCHC 33.9 33.3 33.0  RDW 13.4 13.5 13.8  PLT 337 429* 308   Cardiac Enzymes Recent Labs Lab 10/17/16 2230 10/18/16 0355 10/18/16 0957  TROPONINI 0.03* 0.06* 0.04*   No results for input(s): TROPIPOC in the last 168 hours.  BNP Recent Labs Lab 10/17/16 2230  BNP 1,176.9*    DDimer No results for input(s): DDIMER in the last 168 hours.  Radiology/Studies:  Dg Chest Port 1 View  Result Date: 10/18/2016 CLINICAL DATA:  Pulmonary edema. EXAM: PORTABLE CHEST 1 VIEW COMPARISON:  10/17/2016.  05/31/2016 . FINDINGS: Cardiomegaly with pulmonary vascular prominence and bilateral interstitial prominence consistent with CHF. Other etiologies of interstitial prominence including pneumonitis cannot be excluded. No pleural effusion or pneumothorax . IMPRESSION: Findings consistent with congestive heart failure with pulmonary interstitial edema. Other etiologies of interstitial prominence including interstitial pneumonitis cannot be excluded. No change from 10/17/2016. Electronically Signed   By: Maisie Fus  Register   On: 10/18/2016 13:04   Dg Chest Port 1v Same Day  Result Date: 10/17/2016 CLINICAL DATA:  Shortness of breath EXAM: PORTABLE CHEST 1 VIEW COMPARISON:  05/31/2016 FINDINGS: Probable tiny right pleural effusion. Heart size within normal limits. Diffuse increased interstitial opacity, may reflect mild edema or interstitial inflammatory process. Aortic atherosclerosis. No pneumothorax. IMPRESSION: Possible tiny right effusion. Diffuse increased interstitial opacity, may reflect mild interstitial edema or infection. Electronically Signed   By: Jasmine Pang M.D.   On: 10/17/2016 21:07    Assessment and Plan:   Acute on chronic diastolic heart failure: Most  recent echocardiogram was in 10/2015 and showed EF 50-55% and grade 1 diastolic dysfunction. She is managed at home with Lasix 40 mg daily, Imdur 90 mg daily, losartan 100 mg daily, Toprol-XL 75 mg daily.  The pt developed hypoxic respiratory failure last night. Fever 101.8 (now normal) BNP 1176.9.  CXR was consistent with CHF with pulmonary interstitial edema. Patient was given IV Lasix 40 mg 2 yesterday.Patient has I and O net -0.8 L. Serum creatinine bumped up a bit to 1.3. Troponins were mildly elevated 0.03, 0.06, 0.04 likely related to demand ischemia due to flash pulmonary edema.  She is currently not on a beta blocker. Office weight in May was 146 pounds, today she is 173 pounds (?). Pt has few basilar rales, no edema, is still requiring oxygen.  -Echo has just been completed, pending results.  -Will give another dose of lasix.  -Restart home toprol-XL. BP is stable. HR is 90's-110's.  .  Acute hypoxic respiratory failure: Post angiography the pat was mildly short of breath. On the evening of 9/20 the patient became acutely short of breath, febrile  101.8, tachycardic and hypoxic requiring BiPAP. Pulmonary edema versus PE versus pneumonia. Patient was given IV Lasix, Solu-Medrol, breathing treatments and she has improved, although not back to baseline.  CAD: Hx STEMI 09/2009. No recent chest pain or exertional dyspnea prior to hospitalization. Has some mild chest pressure with respiratory difficulty last night. Was on aspirin and plavix. Plavix on hold for surgery.   Hypertension: See above meds. Blood pressure is currently well controlled.  Peripheral vascular disease: Patient has wet and dry gangrene of the right first second and third toes. An arteriogram done yesterday showed right common femoral artery occlusion. She is scheduled for a right common femoral artery endarterectomy and right femoral to below the knee popliteal artery bypass by Dr. Edilia Bo, was planned for today, but postponed  to next week so we can optimize her cardiac status.  Diabetes type 2: Managed with insulin and metformin. Last hemoglobin A1c in EPIC was 7.6 in 10/2015. Followed by Dr. Rene Paci (not in our system). Pt reports good control with daily CBGs around 100.  Will be on sliding scale insulin while hospitalized. Metformin is on hold. Is being followed by the diabetes coordinator.  Hyperlipidemia: Last LDL in 10/2015 was 44. Continue statin.   Plan to optimize breathing status prior to surgery. Plan to proceed with the surgery. No plans for further cardiac testing at this time.   For questions or updates, please contact [facility] Please consult [email or web address] for contact info under Cardiology/STEMI.   Signed, Berton Bon, NP  10/18/2016 2:42 PM  Personally seen and examined. Agree with above.  Mildly increased work of breathing in bed. Tachy RR, soft systolic m, soft abd, right distal leg wrapped.  No CP Recent respiratory distress.   Pre - op cardiac risk stratification  - She may proceed with vascular surgery with moderate overall cardiac risk (prior Circumflex stent in 2011). No recent anginal symptoms.  - Before Tuesday, optimize respiratory status. Will give an additional IV lasix  - We will resume home Toprol  PO QD  Acute systolic/diastolic HF  - IV lasix as above  - Toprol   PVD  - Per Dr. Edilia Bo.   Will follow.  Donato Schultz, MD

## 2016-10-18 NOTE — Progress Notes (Signed)
Pharmacy Antibiotic Note  [name] [name] is a 66 y.o. female admitted on 10/17/2016 with PAD and gangrene, now w/ concern for sepsis.  Pharmacy has been consulted to add cefepime to vanc regimen.  Plan: Cefepime 2g IV Q24H.  Height: 5\' 4"  (162.6 cm) Weight: 173 lb 4.5 oz (78.6 kg) IBW/kg (Calculated) : 54.7  Temp (24hrs), Avg:99.7 F (37.6 C), Min:98.2 F (36.8 C), Max:101.8 F (38.8 C)   Recent Labs Lab 10/17/16 0634 10/17/16 2230  WBC 18.4* 21.1*  CREATININE 1.30* 1.16*  LATICACIDVEN  --  1.4    Estimated Creatinine Clearance: 48.4 mL/min (A) (by C-G formula based on SCr of 1.16 mg/dL (H)).    Allergies  Allergen Reactions  . Doxycycline Other (See Comments)    lethargy  . Hydrochlorothiazide Other (See Comments)    lethargic   . Latex Rash  . Penicillins Swelling and Rash    Pt states she has tolerated Keflex in the past without problems. States she may have tolerated Augmentin in the past but it caused GI upset. Has patient had a PCN reaction causing immediate rash, facial/tongue/throat swelling, SOB or lightheadedness with hypotension: Yes Has patient had a PCN reaction causing severe rash involving mucus membranes or skin necrosis: No Has patient had a PCN reaction that required hospitalization No Has patient had a PCN reaction occurring within the last 10 years: No    Antimicrobials this admission: Vanc 9/20 >>  Cefepime 9/21 >>   Microbiology results: 9/20 BCx:   Thank you for allowing pharmacy to be a part of this patient's care.  Vernard Gambles, PharmD, BCPS  10/18/2016 12:51 AM

## 2016-10-18 NOTE — Progress Notes (Signed)
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   She previously had left femoral to below-knee popliteal bypass by Dr. Edilia Bo on 10/2015.   She has taken one week of two week course of Bactrim prior to admit after being seen in the office for non-healing right foot wound and discoloration.  Underwent an arteriogram by Dr. Allyson Sabal on 9/20 showing a right common femoral artery occlusion and superficial femoral artery occlusion.  Patient has refused amputation.  Surgery scheduled 9/21 for a right common femoral artery endarterectomy and right femoral to below knee popliteal artery bypass.  On the evening of 9/20, she became acutely short of breath, febrile 101.8, tachycardic, and hypoxic on telemetry, failing NRB, requiring BiPAP.  PCCM called for ongoing hypoxic respiratory distress.    SUBJECTIVE:  Awake and alert. NAD  VITAL SIGNS: BP 134/69   Pulse (!) 109   Temp 98.5 F (36.9 C) (Oral)   Resp (!) 25   Ht  (1.626 m)   Wt 173 lb 4.5 oz (78.6 kg)   SpO2 95%   BMI 29.74 kg/m   HEMODYNAMICS:    VENTILATOR SETTINGS: FiO2 (%):  [50 %] 50 %  INTAKE / OUTPUT: I/O last 3 completed shifts: In: 450 [P.O.:240; I.V.:10; IV Piggyback:200] Out: 1250 [Urine:1250]  PHYSICAL EXAMINATION: General:  Awake and alert , sitting up in bed HEENT: MM pink/moist, no jvd/lan PSY:nl affect Neuro: intact CV: HSR RRR PULM: even/non-labored, decreased bs thru out WU:JWJX, non-tender, bsx4 active  Extremities: warm/dry, rt  foot with occlusive dressing and is cool  Skin: no rashes or lesions  LABS:  BMET  Recent Labs Lab 10/17/16 0634 10/17/16 2230 10/18/16 0355  NA 138 137 136  K 4.9 5.1 5.4*  CL 107 105 107  CO2 20* 15* 19*  BUN 27* 20 25*  CREATININE 1.30* 1.16* 1.32*  GLUCOSE 82 167* 219*    Electrolytes  Recent Labs Lab 10/17/16 0634 10/17/16 2230 10/18/16 0355  CALCIUM 9.0 9.2 8.8*    CBC  Recent Labs Lab 10/17/16 0634 10/17/16 2230 10/18/16 0355  WBC 18.4* 21.1* 14.2*  HGB 9.5* 10.5* 8.9*  HCT 28.0* 31.5* 27.0*  PLT 337 429* 308    Coag's  Recent Labs Lab 10/17/16 0634  INR 1.23    Sepsis Markers  Recent Labs Lab 10/17/16 2230 10/18/16 0149  LATICACIDVEN 1.4 0.7  PROCALCITON  --  0.10    ABG  Recent Labs Lab 10/18/16 0130  PHART 7.376  PCO2ART 32.9  PO2ART 86.9    Liver Enzymes No results for input(s): AST, ALT, ALKPHOS, BILITOT, ALBUMIN in the last 168 hours.  Cardiac Enzymes  Recent Labs Lab 10/17/16 2230 10/18/16 0355  TROPONINI 0.03* 0.06*    Glucose  Recent Labs Lab 10/17/16 0607 10/17/16 0842 10/17/16 2038 10/17/16 2329 10/18/16 0026 10/18/16 0755  GLUCAP 84 80 123* 126* 169* 289*    Imaging Dg Chest Port 1v Same Day  Result Date: 10/17/2016 CLINICAL DATA:  Shortness of breath EXAM: PORTABLE  CHEST 1 VIEW COMPARISON:  05/31/2016 FINDINGS: Probable tiny right pleural effusion. Heart size within normal limits. Diffuse increased interstitial opacity, may reflect mild edema or interstitial inflammatory process. Aortic atherosclerosis. No pneumothorax. IMPRESSION: Possible tiny right effusion. Diffuse increased interstitial opacity, may reflect mild interstitial edema or infection. Electronically Signed   By: Jasmine Pang M.D.   On: 10/17/2016 21:07   STUDIES:  9/20 arteriogram >> shows that the common iliac and external iliac arteries are patent. There is an occlusion of the common femoral artery and superficial femoral  artery. There is reconstitution of the below-knee popliteal artery. There is 2 vessel runoff via the posterior tibial artery and peroneal arteries. The posterior tibial artery is the dominant runoff  CULTURES: BC 9/20>> Sputum   ANTIBIOTICS: Vanc 9/20 >  SIGNIFICANT EVENTS: 9/20 Admit, arteriogram, respiratory distress -> ICU 9/21 much improved ? Transfer out.  LINES/TUBES: PIV x 2  DISCUSSION: 14 yoF w/extensive prior vascular disease admitted with gangrene and occluded common femoral artery and superficial femoral artery, tentatively scheduled for fem-pop bypass 9/21 who went into acute hypoxic respiratory distress, likely flash pulm edema, requiring BiPAP and transfer to ICU. 9/21 much improved with NIMVS(now off)and negative I/o.  ASSESSMENT / PLAN:  PULMONARY A: Acute hypoxic respiratory failure- likely multifactoral- but ddx flash pulmonary edema vs PE (Wells score indeterminate/low)- hypoxia improved w/BiPAP, vs PNA Former smoker (41 pack year hx, not on home nebs) Suspected undiagnosed OSA P:   tx to ICU , may be ready to go out of icu 9/21 PRN BiPAP, better post diuresis Xopenex and atrovent nebs q 6hr prn  Solumedrol  x 1 now (exp wheeze, most likely r/t cardiac) Lasix  IV x 1 , further diuresis ? 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 CAD, HF (10/17 EF 50-55%, G1DD), PVD, PAD, HLD P:  Tele monitoring EKG noted Trend Troponin q 6 x 3  Surgical management per Vascular  Trend Lactic acid x 3  ASA and plavix per VVS  RENAL  Intake/Output Summary (Last 24 hours) at 10/18/16 1113 Last data filed at 10/18/16 1100  Gross per 24 hour  Intake              490 ml  Output             1425 ml  Net             -935 ml   Lab Results  Component Value Date   CREATININE 1.32 (H) 10/18/2016   CREATININE 1.16 (H) 10/17/2016   CREATININE 1.30 (H) 10/17/2016   CREATININE 0.73 01/02/2016    CREATININE 0.58 09/19/2015   CREATININE 0.61 08/24/2015    Recent Labs Lab 10/17/16 0634 10/17/16 2230 10/18/16 0355  K 4.9 5.1 5.4*    A:   AKI- at risk for further/ possible ATN 2/2 dye load/ previously on bactrim  P:   Insert Foley Lasix  x 1  Strict I/Os, daily weights Trend renal panel, Mg, phos  GASTROINTESTINAL A:   Hx GERD P:   NPO while on BiPAP  HEMATOLOGIC A:   Leukocytosis Anemia- chronic P:  Trend CBC Will confer with VVS for VTE ppx   INFECTIOUS A:   Gangrene R foot Concern for osteomyelitis - sed rate 44 Leukocytosis R/o CAP P:   Follow BC Send sputum  Continue vanc, add cefepime for CAP coverage Will need imaging of right foot to confirm/ rule out osteo  per surgery Trend PCT    ENDOCRINE CBG (last 3)   Recent Labs  10/17/16 2329 10/18/16 0026 10/18/16 0755  GLUCAP 126* 169* 289*     A:   DMT2 P:   CBG q 4 SSI cahnged to resistant(dosed with steroids) low dose QHS insulin added  NEUROLOGIC A:   No acute issues  P:   Monitor  FAMILY  - Updates: Patient updated. Pt and family updated 9/21. A  - Inter-disciplinary family meet or Palliative Care meeting due by:  10/25/2016   Disposition: ICU, 9/21 awake and alert. NAD at rest. Responded well to lasix and NIMVS. Sh has had similar episodes in past. Suspect undiagnosed OSA as a contributing factor. Question if she can move out to SDU or tele floor  Critical Care Time:  Family: daughter at bedside Code Status: Full  Brett Canales Minor ACNP Adolph Pollack PCCM Pager [phone] till 3 pm If no answer page [phone] 10/18/2016, 10:59 AM    STAFF NOTE: I, Rory Percy, MD FACP have personally reviewed patient's available data, including medical history, events of note, physical examination and test results as part of my evaluation. I have discussed with resident/NP and other care providers such as pharmacist, RN and RRT. In addition, I personally evaluated patient and elicited  key findings of: alert, no distress now, JVD remains, less crackles to clear anterior lung sounds, abdo soft, foot dressed warm, pcxr I reviewed showed pulm edema, her renal fxn tolerated this fine at about baseline 1.26, she required NIMV and now we can dc this, this is pulmonary edema, concern is diastolic dysfxn or sys heart failure as she has also had prior EF 45% in past, ? Last echo I reviewed we should repeat echo to ensure NO new valvular dz or diast disease or changes in sys fxn, ecg and trop is neg, would repeat echo, would DELAY surgery as high risk to have a pulonary or cardiac complication as of now, likley can go to OR tues am after we monitor resp status, diuresis response and tolerance Can add diet as appears well off NIMV, move to and keep on tele, no role ABX for lungs, I understand need for, I updated pt and family in full and d/w Dr Winferd Humphrey. Tyson Alias, MD, FACP Pgr: [phone] [facility] 10/18/2016 12:20 PM

## 2016-10-18 NOTE — Progress Notes (Signed)
  Echocardiogram 2D Echocardiogram has been performed.  [name] [name] 10/18/2016, 3:30 PM

## 2016-10-18 NOTE — Progress Notes (Signed)
   VASCULAR SURGERY ASSESSMENT & PLAN:   Surgery canceled today because of respiratory issues last night. I have rescheduled her surgery for Tuesday if she is medically ready. Appreciate help from cardiology and critical care medicine.  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 Tentatively plan right common femoral artery endarterectomy and right femoropopliteal bypass graft with amputation of the right first second and third toes, possible transmetatarsal amputation, on Tuesday next week.   SUBJECTIVE:   Breathing much better this afternoon.  PHYSICAL EXAM:   Vitals:   10/18/16 1400 10/18/16 1500 10/18/16 1600 10/18/16 1700  BP: 122/61 128/60 (!) 121/110 (!) 120/58  Pulse: (!) 105 (!) 108 (!) 122 (!) 104  Resp: (!) 26 17 (!) 29 (!) 26  Temp:      TempSrc:      SpO2: 96% 96% 95% 95%  Weight:      Height:       Lungs clear anteriorly. Gangrene of the right foot is stable.  LABS:   Lab Results  Component Value Date   WBC 14.2 (H) 10/18/2016   HGB 8.9 (L) 10/18/2016   HCT 27.0 (L) 10/18/2016   MCV 85.7 10/18/2016   PLT 308 10/18/2016   Lab Results  Component Value Date   CREATININE 1.32 (H) 10/18/2016   Lab Results  Component Value Date   INR 1.23 10/17/2016   CBG (last 3)   Recent Labs  10/18/16 0755 10/18/16 1116 10/18/16 1603  GLUCAP 289* 236* 277*    PROBLEM LIST:    Principal Problem:   PAD (peripheral artery disease) (HCC) Active Problems:   Acute respiratory failure with hypoxia (HCC)   Gangrene of left foot (HCC)   Sepsis (HCC)   CURRENT MEDS:   . aspirin  81 mg Oral Daily  . furosemide  40 mg Intravenous Once  . insulin aspart  0-20 Units Subcutaneous Q4H  . insulin detemir  10 Units Subcutaneous QHS  . isosorbide mononitrate  90 mg Oral QHS  . metoprolol succinate  75 mg Oral Daily  . sodium chloride flush  3 mL Intravenous Q12H    Cari Caraway Beeper:  [phone] Office: [phone] 10/18/2016

## 2016-10-18 NOTE — Telephone Encounter (Signed)
Denny Peon, NP saw Ms. Bones in the office on 10/09/16.  At that time we filled out a surgery sheet for Right Great Toe and 2nd Toe Amputation.  However, Ms. Nestel was scheduled to see Dr. Edilia Bo on 10/16/16 and we were holding off on scheduling surgery until that appointment.  I planned to call Ms. Moro to discuss, but noticed that she is currently and inpatient at [facility].

## 2016-10-18 NOTE — Progress Notes (Signed)
Pharmacy Antibiotic Note  [name] [name] is a 66 y.o. female admitted on 10/17/2016 with PAD and gangrene, now w/ concern for sepsis.  Pharmacy managing vancomycin and cefepime. SCr up after contrast. CrCl down to ~ 40-45 mL/min.   Plan: Continue cefepime 2g IV Q24H. Decrease vancomycin to 750 mg IV Q 12 hours   Height: 5\' 4"  (162.6 cm) Weight: 173 lb 4.5 oz (78.6 kg) IBW/kg (Calculated) : 54.7  Temp (24hrs), Avg:99.1 F (37.3 C), Min:98.2 F (36.8 C), Max:101.8 F (38.8 C)   Recent Labs Lab 10/17/16 0634 10/17/16 2230 10/18/16 0149 10/18/16 0355  WBC 18.4* 21.1*  --  14.2*  CREATININE 1.30* 1.16*  --  1.32*  LATICACIDVEN  --  1.4 0.7  --     Estimated Creatinine Clearance: 42.6 mL/min (A) (by C-G formula based on SCr of 1.32 mg/dL (H)).    Allergies  Allergen Reactions  . Doxycycline Other (See Comments)    lethargy  . Hydrochlorothiazide Other (See Comments)    lethargic   . Latex Rash  . Penicillins Swelling and Rash    Pt states she has tolerated Keflex in the past without problems. States she may have tolerated Augmentin in the past but it caused GI upset. Has patient had a PCN reaction causing immediate rash, facial/tongue/throat swelling, SOB or lightheadedness with hypotension: Yes Has patient had a PCN reaction causing severe rash involving mucus membranes or skin necrosis: No Has patient had a PCN reaction that required hospitalization No Has patient had a PCN reaction occurring within the last 10 years: No    Antimicrobials this admission: Vanc 9/20 >>  Cefepime 9/21 >>   Microbiology results: 9/20 BCx: sent   Thank you for allowing pharmacy to be a part of this patient's care.  Vinnie Level, PharmD., BCPS Clinical Pharmacist Pager [phone]

## 2016-10-18 NOTE — Progress Notes (Signed)
Report called to [place] RN on unit 4 east.Patient to be transferred to unit 4 east bed 8.

## 2016-10-18 NOTE — Anesthesia Preprocedure Evaluation (Addendum)
Anesthesia Evaluation  Patient identified by MRN, date of birth, ID band Patient awake    Reviewed: Allergy & Precautions, H&P , NPO status , Patient's Chart, lab work & pertinent test results  Airway Mallampati: II   Neck ROM: full    Dental  (+) Edentulous Upper, Missing,    Pulmonary former smoker,    breath sounds clear to auscultation       Cardiovascular hypertension, Pt. on medications and Pt. on home beta blockers + CAD, + Past MI, + Cardiac Stents, + Peripheral Vascular Disease and +CHF   Rhythm:regular Rate:Normal  ECG: NSR, rate 89  ECHO: Left ventricle: Septal and posterior lateral hypokinesis. The cavity size was mildly dilated. Systolic function was mildly reduced. The estimated ejection fraction was in the range of 45% to 50%. Wall motion was normal; there were no regional wall motion abnormalities. Left ventricular diastolic function parameters were normal. Aortic valve: There was trivial regurgitation. Mitral valve: Calcified annulus. Mildly thickened leaflets . There was mild regurgitation. Left atrium: The atrium was moderately dilated. Atrial septum: No defect or patent foramen ovale was identified. Pulmonary arteries: PA peak pressure: 32 mm Hg (S).  Sees cardiologist Tresa Endo)  1. Acute on chronic combined systolic and diastolic congestive heart failure: The patient feels quite a bit better. Her echocardiogram shows an EF of 45-50%. She should be at low to moderate risk for her upcoming vascular surgery.  2. Coronary artery disease: She status post stenting in 2001. She's not had any recent episodes of chest discomfort.   Neuro/Psych  Headaches,  Neuromuscular disease CVA, Residual Symptoms    GI/Hepatic GERD  Medicated and Controlled,  Endo/Other  diabetes, Type 2, Insulin Dependent, Oral Hypoglycemic Agents  Renal/GU      Musculoskeletal  (+) Arthritis ,   Abdominal   Peds  Hematology  (+)  anemia ,   Anesthesia Other Findings HLD Ambulates with walker  Reproductive/Obstetrics                           Anesthesia Physical Anesthesia Plan  ASA: IV  Anesthesia Plan: General   Post-op Pain Management:    Induction: Intravenous  PONV Risk Score and Plan: 3 and Ondansetron, Dexamethasone and Treatment may vary due to age or medical condition  Airway Management Planned: Oral ETT  Additional Equipment: Arterial line  Intra-op Plan:   Post-operative Plan: Extubation in OR and Possible Post-op intubation/ventilation  Informed Consent: I have reviewed the patients History and Physical, chart, labs and discussed the procedure including the risks, benefits and alternatives for the proposed anesthesia with the patient or authorized representative who has indicated his/her understanding and acceptance.   Dental advisory given  Plan Discussed with: CRNA  Anesthesia Plan Comments:       Anesthesia Quick Evaluation

## 2016-10-19 DIAGNOSIS — A419 Sepsis, unspecified organism: Principal | ICD-10-CM

## 2016-10-19 DIAGNOSIS — I5043 Acute on chronic combined systolic (congestive) and diastolic (congestive) heart failure: Secondary | ICD-10-CM

## 2016-10-19 DIAGNOSIS — J9601 Acute respiratory failure with hypoxia: Secondary | ICD-10-CM

## 2016-10-19 LAB — CBC
HEMATOCRIT: 24.4 % — AB (ref 36.0–46.0)
HEMOGLOBIN: 8.1 g/dL — AB (ref 12.0–15.0)
MCH: 28.2 pg (ref 26.0–34.0)
MCHC: 33.2 g/dL (ref 30.0–36.0)
MCV: 85 fL (ref 78.0–100.0)
Platelets: 341 10*3/uL (ref 150–400)
RBC: 2.87 MIL/uL — AB (ref 3.87–5.11)
RDW: 13.5 % (ref 11.5–15.5)
WBC: 18.1 10*3/uL — ABNORMAL HIGH (ref 4.0–10.5)

## 2016-10-19 LAB — BASIC METABOLIC PANEL
ANION GAP: 8 (ref 5–15)
BUN: 33 mg/dL — ABNORMAL HIGH (ref 6–20)
CHLORIDE: 106 mmol/L (ref 101–111)
CO2: 20 mmol/L — ABNORMAL LOW (ref 22–32)
Calcium: 8.6 mg/dL — ABNORMAL LOW (ref 8.9–10.3)
Creatinine, Ser: 1.2 mg/dL — ABNORMAL HIGH (ref 0.44–1.00)
GFR, EST AFRICAN AMERICAN: 53 mL/min — AB (ref >60)
GFR, EST NON AFRICAN AMERICAN: 46 mL/min — AB (ref >60)
Glucose, Bld: 170 mg/dL — ABNORMAL HIGH (ref 65–99)
POTASSIUM: 5.1 mmol/L (ref 3.5–5.1)
SODIUM: 134 mmol/L — AB (ref 135–145)

## 2016-10-19 LAB — GLUCOSE, CAPILLARY
GLUCOSE-CAPILLARY: 131 mg/dL — AB (ref 65–99)
GLUCOSE-CAPILLARY: 95 mg/dL (ref 65–99)
Glucose-Capillary: 186 mg/dL — ABNORMAL HIGH (ref 65–99)
Glucose-Capillary: 114 mg/dL — ABNORMAL HIGH (ref 65–99)
Glucose-Capillary: 126 mg/dL — ABNORMAL HIGH (ref 65–99)

## 2016-10-19 LAB — MAGNESIUM: MAGNESIUM: 1.6 mg/dL — AB (ref 1.7–2.4)

## 2016-10-19 LAB — PROCALCITONIN: PROCALCITONIN: 0.2 ng/mL

## 2016-10-19 LAB — PHOSPHORUS: Phosphorus: 3.6 mg/dL (ref 2.5–4.6)

## 2016-10-19 NOTE — Progress Notes (Signed)
  Progress Note    10/19/2016 8:16 AM Hospital Day 2  Subjective:  Says she's a little winded from sitting up on the side of the bed.   Tm 99.1 now afebrile HR 80's-120's NSR 120's-140's systolic 96% [id number]  Vitals:   10/18/16 2113 10/19/16 0418  BP: (!) 142/85 (!) 131/59  Pulse: 97 93  Resp: (!) 23 (!) 28  Temp: 99.1 F (37.3 C) 98.5 F (36.9 C)  SpO2: 96% 96%    Physical Exam: Sitting up on side of bed for breakfast-no distress  CBC    Component Value Date/Time   WBC 18.1 (H) 10/19/2016 0343   RBC 2.87 (L) 10/19/2016 0343   HGB 8.1 (L) 10/19/2016 0343   HCT 24.4 (L) 10/19/2016 0343   PLT 341 10/19/2016 0343   MCV 85.0 10/19/2016 0343   MCH 28.2 10/19/2016 0343   MCHC 33.2 10/19/2016 0343   RDW 13.5 10/19/2016 0343   LYMPHSABS 2.4 10/17/2016 2230   MONOABS 1.6 (H) 10/17/2016 2230   EOSABS 0.4 10/17/2016 2230   BASOSABS 0.0 10/17/2016 2230    BMET    Component Value Date/Time   NA 134 (L) 10/19/2016 0343   NA 142 12/22/2015   K 5.1 10/19/2016 0343   CL 106 10/19/2016 0343   CO2 20 (L) 10/19/2016 0343   GLUCOSE 170 (H) 10/19/2016 0343   BUN 33 (H) 10/19/2016 0343   BUN 9 12/22/2015   CREATININE 1.20 (H) 10/19/2016 0343   CREATININE 0.73 01/02/2016 1555   CALCIUM 8.6 (L) 10/19/2016 0343   GFRNONAA 46 (L) 10/19/2016 0343   GFRAA 53 (L) 10/19/2016 0343    INR    Component Value Date/Time   INR 1.23 10/17/2016 0634     Intake/Output Summary (Last 24 hours) at 10/19/16 0816 Last data filed at 10/19/16 0419  Gross per 24 hour  Intake              260 ml  Output             1025 ml  Net             -765 ml     Assessment/Plan:  66 y.o. female with gangrene of right foot scheduled for right common femoral artery endarterectomy and right femoropopliteal bypass graft with amputation of the right first second and third toes, possible transmetatarsal amputation, on Tuesday next week.  Hospital Day 2  -continue to optimize respiratory status before  Tuesday-continues to deny any chest pain -hyperkalemia slightly improved today at 5.1 -creatinine improved today at 1.2 -leukocytosis increased today with low grade fever x 1.  Continue IV abx -may benefit from PT consult-mobilize at least OOB to chair tid at meal times.   Doreatha Massed, PA-C [facility] [phone] 10/19/2016 8:16 AM   Agree with the above.  She is feeling better.  Plan for surgery Tuesday  Durene Cal

## 2016-10-19 NOTE — Progress Notes (Signed)
Inpatient Diabetes Program Recommendations  AACE/ADA: New Consensus Statement on Inpatient Glycemic Control (2015)  Target Ranges:  Prepandial:   less than 140 mg/dL      Peak postprandial:   less than 180 mg/dL (1-2 hours)      Critically ill patients:  140 - 180 mg/dL   Results for EMERSEN, KELSAY (MRN [id number]) as of 10/19/2016 08:36  Ref. Range 10/18/2016 07:55 10/18/2016 11:16 10/18/2016 16:03 10/18/2016 19:35 10/18/2016 23:49 10/19/2016 04:14  Glucose-Capillary Latest Ref Range: 65 - 99 mg/dL 950 (H) 932 (H) 671 (H) 197 (H) 140 (H) 186 (H)   Review of Glycemic Control  Outpatient Diabetes medications: Levemir 30 units daily, Metformin 1000 mg BID Current orders for Inpatient glycemic control: Levemir 10 units daily, Novolog 0-20 units Q4H  Inpatient Diabetes Program Recommendations:  Correction (SSI): If patient is eating well, please consider changing frequency of CBGs and Novolog correction to ACHS. Insulin - Meal Coverage: Please consider ordering Novolog 3 units TID with meals for meal coverage if patient eats at least 50% of meals.  Thanks, Orlando Penner, RN, MSN, CDE Diabetes Coordinator Inpatient Diabetes Program [phone] (Team Pager from 8am to 5pm)

## 2016-10-19 NOTE — Progress Notes (Signed)
Progress Note  Patient Name: [name] [name] Date of Encounter: 10/19/2016  Primary Cardiologist: Tresa Endo   Subjective   66 y.o. female with a hx of Type 2 diabetes, stroke, PAD, MI in 2011 requiring DES to circumflex, hypertension, hyperlipidemia, GERD, CHF, chronically occluded right carotid artery who is being seen   for the preoperative evaluation prior to vascular surgery and CHF at the request of Dr. Edilia Bo  Echo from September 21 reveals mildly depressed left ventricle systolic function with EF 45-50%. She had trivial aortic insufficiency.  Her breathing has improved. She's not in any acute distress  Inpatient Medications    Scheduled Meds: . aspirin  81 mg Oral Daily  . insulin aspart  0-20 Units Subcutaneous Q4H  . insulin detemir  10 Units Subcutaneous QHS  . isosorbide mononitrate  90 mg Oral QHS  . metoprolol succinate  75 mg Oral Daily  . sodium chloride flush  3 mL Intravenous Q12H   Continuous Infusions: . sodium chloride    . ceFEPime (MAXIPIME) IV Stopped (10/19/16 0041)  . vancomycin Stopped (10/19/16 0416)   PRN Meds: sodium chloride, acetaminophen, HYDROmorphone (DILAUDID) injection, ipratropium, levalbuterol, morphine injection, ondansetron (ZOFRAN) IV, oxyCODONE **OR** oxyCODONE, sodium chloride flush   Vital Signs    Vitals:   10/18/16 2017 10/18/16 2113 10/19/16 0418 10/19/16 0942  BP:  (!) 142/85 (!) 131/59 132/60  Pulse:  97 93 94  Resp:  (!) 23 (!) 28   Temp: 98.6 F (37 C) 99.1 F (37.3 C) 98.5 F (36.9 C)   TempSrc: Oral Oral Oral   SpO2:  96% 96%   Weight:      Height:        Intake/Output Summary (Last 24 hours) at 10/19/16 1231 Last data filed at 10/19/16 0943  Gross per 24 hour  Intake              223 ml  Output              850 ml  Net             -627 ml   Filed Weights   10/17/16 0602 10/18/16 0000  Weight: 164 lb (74.4 kg) 173 lb 4.5 oz (78.6 kg)    Telemetry    NSR  - Personally Reviewed  ECG     NSR  -  Personally Reviewed  Physical Exam   GEN: No acute distress.   Neck: No JVD Cardiac: RRR, soft systolic murmur  Respiratory: Clear to auscultation bilaterally. GI: Soft, nontender, non-distended  MS:  right lower leg is bandaged.  Neuro:  Nonfocal  Psych: Normal affect   Labs    Chemistry Recent Labs Lab 10/17/16 2230 10/18/16 0355 10/19/16 0343  NA 137 136 134*  K 5.1 5.4* 5.1  CL 105 107 106  CO2 15* 19* 20*  GLUCOSE 167* 219* 170*  BUN 20 25* 33*  CREATININE 1.16* 1.32* 1.20*  CALCIUM 9.2 8.8* 8.6*  GFRNONAA 48* 41* 46*  GFRAA 56* 48* 53*  ANIONGAP 17* 10 8     Hematology Recent Labs Lab 10/17/16 2230 10/18/16 0355 10/19/16 0343  WBC 21.1* 14.2* 18.1*  RBC 3.66* 3.15* 2.87*  HGB 10.5* 8.9* 8.1*  HCT 31.5* 27.0* 24.4*  MCV 86.1 85.7 85.0  MCH 28.7 28.3 28.2  MCHC 33.3 33.0 33.2  RDW 13.5 13.8 13.5  PLT 429* 308 341    Cardiac Enzymes Recent Labs Lab 10/17/16 2230 10/18/16 0355 10/18/16 0957  TROPONINI 0.03* 0.06*  0.04*   No results for input(s): TROPIPOC in the last 168 hours.   BNP Recent Labs Lab 10/17/16 2230  BNP 1,176.9*     DDimer No results for input(s): DDIMER in the last 168 hours.   Radiology    Dg Chest Port 1 View  Result Date: 10/18/2016 CLINICAL DATA:  Pulmonary edema. EXAM: PORTABLE CHEST 1 VIEW COMPARISON:  10/17/2016.  05/31/2016 . FINDINGS: Cardiomegaly with pulmonary vascular prominence and bilateral interstitial prominence consistent with CHF. Other etiologies of interstitial prominence including pneumonitis cannot be excluded. No pleural effusion or pneumothorax . IMPRESSION: Findings consistent with congestive heart failure with pulmonary interstitial edema. Other etiologies of interstitial prominence including interstitial pneumonitis cannot be excluded. No change from 10/17/2016. Electronically Signed   By: Maisie Fus  Register   On: 10/18/2016 13:04   Dg Chest Port 1v Same Day  Result Date: 10/17/2016 CLINICAL DATA:   Shortness of breath EXAM: PORTABLE CHEST 1 VIEW COMPARISON:  05/31/2016 FINDINGS: Probable tiny right pleural effusion. Heart size within normal limits. Diffuse increased interstitial opacity, may reflect mild edema or interstitial inflammatory process. Aortic atherosclerosis. No pneumothorax. IMPRESSION: Possible tiny right effusion. Diffuse increased interstitial opacity, may reflect mild interstitial edema or infection. Electronically Signed   By: Jasmine Pang M.D.   On: 10/17/2016 21:07    Cardiac Studies      Patient Profile     66 y.o. female  with a history of coronary artery disease-status post stenting of her circumflex artery, hypertension, hyperlipidemia, diabetes mellitus, stroke.  Assessment & Plan    1. Acute on chronic combined systolic and diastolic congestive heart failure The patient is breathing much better. After diuresis. The echocardiogram shows that her left ventricular systolic function is mildly depressed at 45-50%. Overall this is not significant different than her previous evaluations. She should be at low to moderate risk for her upcoming vascular surgery.  2. Coronary artery disease: She is status post stenting in 2001. She's not had any episodes of chest pain.  3. Essential hypertension: Continue current medication.  Her blood pressure is  well-controlled.   For questions or updates, please contact [facility] Please consult [email or web address] for contact info under Cardiology/STEMI.      Signed, Kristeen Miss, MD  10/19/2016, 12:31 PM

## 2016-10-19 NOTE — Progress Notes (Signed)
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   She previously had left femoral to below-knee popliteal bypass by Dr. Edilia Bo on 10/2015.   She has taken one week of two week course of Bactrim prior to admit after being seen in the office for non-healing right foot wound and discoloration.  Underwent an arteriogram by Dr. Allyson Sabal on 9/20 showing a right common femoral artery occlusion and superficial femoral artery occlusion.  Patient   refused amputation.  Surgery scheduled 9/21 for a right common femoral artery endarterectomy and right femoral to below knee popliteal artery bypass.  On the evening of 9/20, she became acutely short of breath, febrile 101.8, tachycardic, and hypoxic on telemetry, failing NRB, requiring BiPAP.  PCCM called for ongoing hypoxic respiratory distress.    SUBJECTIVE:  Comfortable lying flat in bed 3lpm / did not use bipap  overnight   VITAL SIGNS: BP (!) 127/55 (BP Location: Right Arm)   Pulse 94   Temp 98.2 F (36.8 C) (Oral)   Resp (!) 28   Ht 5\' 4"  (1.626 m)   Wt 173 lb 4.5 oz (78.6 kg)   SpO2 96%   BMI 29.74 kg/m        INTAKE / OUTPUT: I/O last 3 completed shifts: In: 480 [I.V.:130; IV Piggyback:350] Out: 1775 [Urine:1775]  PHYSICAL EXAMINATION: General:  Awake and alert , nad flat in bed  HEENT: MM pink/moist, no jvd/lan PSY:nl affect Neuro: intact CV: HSR RRR PULM: even/non-labored, decreased bs but no insp crackles or wheeze  BW:IOMB, non-tender, bsx4 active  Extremities: warm/dry, rt foot with occlusive dressing  and is cool  Skin: no rashes or lesions/ R leg wrapped up below R calf   LABS:  BMET  Recent Labs Lab 10/17/16 2230 10/18/16 0355 10/19/16 0343  NA 137 136 134*  K 5.1 5.4* 5.1  CL 105 107 106  CO2 15* 19* 20*  BUN 20 25* 33*  CREATININE 1.16* 1.32* 1.20*  GLUCOSE 167* 219* 170*    Electrolytes  Recent Labs Lab 10/17/16 2230 10/18/16 0355 10/19/16 0343  CALCIUM 9.2 8.8* 8.6*  MG  --   --  1.6*  PHOS  --   --  3.6    CBC  Recent Labs Lab 10/17/16 2230 10/18/16 0355 10/19/16 0343  WBC 21.1* 14.2* 18.1*  HGB 10.5* 8.9* 8.1*  HCT 31.5* 27.0* 24.4*  PLT 429* 308 341    Coag's  Recent Labs Lab 10/17/16 0634  INR 1.23    Sepsis Markers  Recent Labs Lab 10/17/16 2230 10/18/16 0149 10/19/16 0343  LATICACIDVEN 1.4 0.7  --   PROCALCITON  --  0.10 0.20    ABG  Recent Labs Lab 10/18/16 0130  PHART 7.376  PCO2ART 32.9  PO2ART 86.9    Liver Enzymes No results for input(s): AST, ALT, ALKPHOS, BILITOT, ALBUMIN in the last 168 hours.  Cardiac Enzymes  Recent Labs Lab 10/17/16 2230 10/18/16 0355 10/18/16 0957  TROPONINI 0.03* 0.06* 0.04*    Glucose  Recent Labs Lab 10/18/16 1603 10/18/16  1935 10/18/16 2349 10/19/16 0414 10/19/16 0836 10/19/16 1206  GLUCAP 277* 197* 140* 186* 114* 131*    Imaging No results found. STUDIES:  9/20 arteriogram >> shows that the common iliac and external iliac arteries are patent. There is an occlusion of the common femoral artery and superficial femoral artery. There is reconstitution of the below-knee popliteal artery. There is 2 vessel runoff via the posterior tibial artery and peroneal arteries. The posterior tibial artery is the dominant runoff  CULTURES: BC 9/20>>     ANTIBIOTICS: maxepime 9/20 >   Vanc 9/20 >  SIGNIFICANT EVENTS: 9/20 Admit, arteriogram, respiratory distress> resolved 9/21      LINES/TUBES: PIV x 2  DISCUSSION: 66 yoF w/extensive prior vascular disease admitted  with gangrene and occluded common femoral artery and superficial femoral artery, tentatively scheduled for fem-pop bypass 9/21 who went into acute hypoxic respiratory distress, likely flash pulm edema, requiring BiPAP and transfer to ICU. 9/21 much improved with NIMVS x overnight then off am 9/21 p negative I/o.  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 CAD, HF (10/17 EF 50-55%, G1DD), PVD, PAD, HLD P:  Tele monitoring  ASA and plavix per VVS  RENAL  Intake/Output Summary (Last 24 hours) at 10/19/16 1511 Last data filed at 10/19/16 0943  Gross per 24 hour  Intake               53 ml  Output              750 ml  Net             -697 ml   Lab Results  Component Value Date   CREATININE 1.20 (H) 10/19/2016   CREATININE 1.32 (H) 10/18/2016   CREATININE 1.16 (H) 10/17/2016   CREATININE 0.73 01/02/2016   CREATININE 0.58 09/19/2015   CREATININE 0.61 08/24/2015    Recent Labs Lab 10/17/16 2230 10/18/16 0355 10/19/16 0343  K 5.1 5.4* 5.1    A:   AKI- at risk for further/ possible ATN 2/2 dye load/ previously on bactrim  P:   Strict I/Os, daily weights Trend renal panel, Mg, phos  GASTROINTESTINAL A:   Hx GERD P:  ppi  HEMATOLOGIC A:   Leukocytosis Anemia- chronic P:  Trend CBC Will confer with VVS for VTE ppx   INFECTIOUS A:   Gangrene R foot Concern for osteomyelitis - sed rate 44 Leukocytosis R/o CAP P:   Follow BC  Continue vanc, add cefepime for CAP coverage Will need imaging of right foot to confirm/ rule out osteo per surgery Trend PCT    ENDOCRINE CBG (last 3)   Recent Labs   10/19/16 0414 10/19/16 0836 10/19/16 1206  GLUCAP 186* 114* 131*     A:   DMT2 P:   CBG q 4 No change rx    Sandrea Hughs, MD Pulmonary and Critical Care Medicine [facility] Cell [phone] After 5:30 PM or weekends, use Beeper [phone]

## 2016-10-20 DIAGNOSIS — I2510 Atherosclerotic heart disease of native coronary artery without angina pectoris: Secondary | ICD-10-CM

## 2016-10-20 LAB — GLUCOSE, CAPILLARY
GLUCOSE-CAPILLARY: 98 mg/dL (ref 65–99)
GLUCOSE-CAPILLARY: 97 mg/dL (ref 65–99)
GLUCOSE-CAPILLARY: 151 mg/dL — AB (ref 65–99)
Glucose-Capillary: 133 mg/dL — ABNORMAL HIGH (ref 65–99)
Glucose-Capillary: 148 mg/dL — ABNORMAL HIGH (ref 65–99)
Glucose-Capillary: 152 mg/dL — ABNORMAL HIGH (ref 65–99)

## 2016-10-20 LAB — PROCALCITONIN

## 2016-10-20 MED ORDER — OXYCODONE-ACETAMINOPHEN 5-325 MG PO TABS
1.0000 | ORAL_TABLET | ORAL | Status: DC | PRN
  Administered 2016-10-20 – 2016-10-22 (×6): 1 via ORAL
  Filled 2016-10-20 (×7): qty 1

## 2016-10-20 MED ORDER — HEPARIN SODIUM (PORCINE) 5000 UNIT/ML IJ SOLN
5000.0000 [IU] | Freq: Three times a day (TID) | INTRAMUSCULAR | Status: DC
  Administered 2016-10-20 – 2016-10-28 (×23): 5000 [IU] via SUBCUTANEOUS
  Filled 2016-10-20 (×24): qty 1

## 2016-10-20 MED ORDER — PNEUMOCOCCAL VAC POLYVALENT 25 MCG/0.5ML IJ INJ
0.5000 mL | INJECTION | INTRAMUSCULAR | Status: AC
  Administered 2016-10-23: 0.5 mL via INTRAMUSCULAR

## 2016-10-20 NOTE — Progress Notes (Signed)
  Progress Note    10/20/2016 11:20 AM Hospital Day 3  Subjective:  Having pain in right foot  Tm 99.6 now afebrile VSS 97% 2LO2NC   Vitals:   10/20/16 0410 10/20/16 0830  BP: (!) 123/55   Pulse: 90   Resp: (!) 21   Temp: 99.6 F (37.6 C) 98.8 F (37.1 C)  SpO2: 94%     Physical Exam: Lungs:  Non labored Extremities:  Right foot is wrapped  CBC    Component Value Date/Time   WBC 18.1 (H) 10/19/2016 0343   RBC 2.87 (L) 10/19/2016 0343   HGB 8.1 (L) 10/19/2016 0343   HCT 24.4 (L) 10/19/2016 0343   PLT 341 10/19/2016 0343   MCV 85.0 10/19/2016 0343   MCH 28.2 10/19/2016 0343   MCHC 33.2 10/19/2016 0343   RDW 13.5 10/19/2016 0343   LYMPHSABS 2.4 10/17/2016 2230   MONOABS 1.6 (H) 10/17/2016 2230   EOSABS 0.4 10/17/2016 2230   BASOSABS 0.0 10/17/2016 2230    BMET    Component Value Date/Time   NA 134 (L) 10/19/2016 0343   NA 142 12/22/2015   K 5.1 10/19/2016 0343   CL 106 10/19/2016 0343   CO2 20 (L) 10/19/2016 0343   GLUCOSE 170 (H) 10/19/2016 0343   BUN 33 (H) 10/19/2016 0343   BUN 9 12/22/2015   CREATININE 1.20 (H) 10/19/2016 0343   CREATININE 0.73 01/02/2016 1555   CALCIUM 8.6 (L) 10/19/2016 0343   GFRNONAA 46 (L) 10/19/2016 0343   GFRAA 53 (L) 10/19/2016 0343    INR    Component Value Date/Time   INR 1.23 10/17/2016 0634     Intake/Output Summary (Last 24 hours) at 10/20/16 1120 Last data filed at 10/20/16 0026  Gross per 24 hour  Intake              240 ml  Output              450 ml  Net             -210 ml     Assessment/Plan:  66 y.o. female with gangrene of right foot scheduled for right common femoral artery endarterectomy and right femoropopliteal bypass graft with amputation of the right first second and third toes, possible transmetatarsal amputation, on Tuesday next week Hospital Day 3  -pt says she's breathing a little bit better but having increased pain in her foot today.  She only had IV pain medication ordered-wrote order  for percocet -DM coordinator rec for Novalog 3 units tid with meals, however, her BS is in the 90's so will hold off ordering this  -for surgery on Tuesday -labs in am-will start SQ heparin for DVT prophylaxis  Doreatha Massed, PA-C [facility] [phone] 10/20/2016 11:20 AM   I agree with the above.  I have seen and evaluated the patient.  She is feeling somewhat better with regards to her breathing.  She still has pain in her right foot.  She is scheduled for bypass graft on Tuesday.  Durene Cal

## 2016-10-20 NOTE — Progress Notes (Signed)
Pharmacy Antibiotic Note  [name] [name] is a 66 y.o. female admitted on 10/17/2016 with PAD and gangrene, now w/ concern for sepsis.  Pharmacy managing vancomycin and cefepime.   Day #4 of abx for gangrene of R foot. Concern for osteo. Scheduled for vascular surgery on 9/25. May need amputation as well. Afebrile, WBC 18.1  Plan: Continue vancomycin 750 mg IV q12h Continue cefepime 2 gm IV Q 24h Monitor clinical picture, renal function, VT prn F/U C&S, abx deescalation / LOT  Fem-pop bypass likely Tue   Height: 5\' 4"  (162.6 cm) Weight: 173 lb 4.5 oz (78.6 kg) IBW/kg (Calculated) : 54.7  Temp (24hrs), Avg:99.1 F (37.3 C), Min:98.2 F (36.8 C), Max:99.6 F (37.6 C)   Recent Labs Lab 10/17/16 0634 10/17/16 2230 10/18/16 0149 10/18/16 0355 10/19/16 0343  WBC 18.4* 21.1*  --  14.2* 18.1*  CREATININE 1.30* 1.16*  --  1.32* 1.20*  LATICACIDVEN  --  1.4 0.7  --   --     Estimated Creatinine Clearance: 46.8 mL/min (A) (by C-G formula based on SCr of 1.2 mg/dL (H)).    Allergies  Allergen Reactions  . Doxycycline Other (See Comments)    lethargy  . Hydrochlorothiazide Other (See Comments)    lethargic   . Latex Rash  . Penicillins Swelling and Rash    Pt states she has tolerated Keflex in the past without problems. States she may have tolerated Augmentin in the past but it caused GI upset. Has patient had a PCN reaction causing immediate rash, facial/tongue/throat swelling, SOB or lightheadedness with hypotension: Yes Has patient had a PCN reaction causing severe rash involving mucus membranes or skin necrosis: No Has patient had a PCN reaction that required hospitalization No Has patient had a PCN reaction occurring within the last 10 years: No    Thank you for allowing pharmacy to be a part of this patient's care.  Enzo Bi, PharmD, BCPS Clinical Pharmacist Pager [phone] 10/20/2016 9:58 AM

## 2016-10-20 NOTE — Progress Notes (Signed)
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 Edilia Bo  Echocardiogram from September 21 reveals mildly depressed left ventricle systolic function with EF of 45-50%. She has trivial aortic insufficiency.  Inpatient Medications    Scheduled Meds: . aspirin  81 mg Oral Daily  . heparin subcutaneous  5,000 Units Subcutaneous Q8H  . insulin aspart  0-20 Units Subcutaneous Q4H  . insulin detemir  10 Units Subcutaneous QHS  . isosorbide mononitrate  90 mg Oral QHS  . metoprolol succinate  75 mg Oral Daily  . [START ON 10/21/2016] pneumococcal 23 valent vaccine  0.5 mL Intramuscular Tomorrow-1000  . sodium chloride flush  3 mL Intravenous Q12H   Continuous Infusions: . sodium chloride    . ceFEPime (MAXIPIME) IV Stopped (10/19/16 2333)  . vancomycin Stopped (10/20/16 0418)   PRN Meds: sodium chloride, acetaminophen, HYDROmorphone (DILAUDID) injection, ipratropium, levalbuterol, morphine injection, ondansetron (ZOFRAN) IV, oxyCODONE-acetaminophen, sodium chloride flush   Vital Signs    Vitals:   10/19/16 1957 10/19/16 2000 10/20/16 0410 10/20/16 0830  BP: (!) 141/60  (!) 123/55   Pulse: 92 90 90   Resp: (!) 27 (!) 28 (!) 21   Temp: 99.5 F (37.5 C)  99.6 F (37.6 C) 98.8 F (37.1 C)  TempSrc: Oral  Oral Oral  SpO2: 95% 96% 94%   Weight:      Height:        Intake/Output Summary (Last 24 hours) at 10/20/16 1129 Last data filed at 10/20/16 0026  Gross per 24 hour  Intake              240 ml  Output              450 ml  Net             -210 ml   Filed Weights   10/17/16 0602 10/18/16 0000  Weight: 164 lb (74.4  kg) 173 lb 4.5 oz (78.6 kg)    Telemetry     NSR  77  - Personally Reviewed  ECG     NSR  - Personally Reviewed  Physical Exam   GEN: No acute distress.   Neck: No JVD Cardiac: RRR, soft systolic murmur Respiratory: Clear to auscultation bilaterally. GI: Soft, nontender, non-distended  MS:  right lower leg is bandaged. . Neuro:  Nonfocal  Psych: Normal affect   Labs    Chemistry Recent Labs Lab 10/17/16 2230 10/18/16 0355 10/19/16 0343  NA 137 136 134*  K 5.1 5.4* 5.1  CL 105 107 106  CO2 15* 19* 20*  GLUCOSE 167* 219* 170*  BUN 20 25* 33*  CREATININE 1.16* 1.32* 1.20*  CALCIUM 9.2 8.8* 8.6*  GFRNONAA 48* 41* 46*  GFRAA 56* 48* 53*  ANIONGAP 17* 10 8     Hematology Recent Labs Lab 10/17/16 2230 10/18/16 0355 10/19/16 0343  WBC 21.1* 14.2* 18.1*  RBC 3.66* 3.15* 2.87*  HGB 10.5* 8.9* 8.1*  HCT 31.5* 27.0* 24.4*  MCV 86.1 85.7 85.0  MCH 28.7 28.3 28.2  MCHC 33.3 33.0 33.2  RDW 13.5 13.8 13.5  PLT 429* 308 341    Cardiac Enzymes Recent  Labs Lab 10/17/16 2230 10/18/16 0355 10/18/16 0957  TROPONINI 0.03* 0.06* 0.04*   No results for input(s): TROPIPOC in the last 168 hours.   BNP Recent Labs Lab 10/17/16 2230  BNP 1,176.9*     DDimer No results for input(s): DDIMER in the last 168 hours.   Radiology    Dg Chest Port 1 View  Result Date: 10/18/2016 CLINICAL DATA:  Pulmonary edema. EXAM: PORTABLE CHEST 1 VIEW COMPARISON:  10/17/2016.  05/31/2016 . FINDINGS: Cardiomegaly with pulmonary vascular prominence and bilateral interstitial prominence consistent with CHF. Other etiologies of interstitial prominence including pneumonitis cannot be excluded. No pleural effusion or pneumothorax . IMPRESSION: Findings consistent with congestive heart failure with pulmonary interstitial edema. Other etiologies of interstitial prominence including interstitial pneumonitis cannot be excluded. No change from 10/17/2016. Electronically Signed   By: Maisie Fus   Register   On: 10/18/2016 13:04    Cardiac Studies     Patient Profile     66 y.o. female with CAD status post stenting of her circumflex proximal artery, hypertension, hyperlipidemia, diabetes mellitus and previous stroke.  Assessment & Plan    1. Acute on chronic combined systolic and diastolic congestive heart failure: The patient feels quite a bit better. Her echocardiogram shows an EF of 45-50%. She should be at low to moderate risk for her upcoming vascular surgery.  2. Coronary artery disease: She status post stenting in 2001. She's not had any recent episodes of chest discomfort.  For questions or updates, please contact [facility] Please consult [email or web address] for contact info under Cardiology/STEMI.      Signed, Kristeen Miss, MD  10/20/2016, 11:29 AM

## 2016-10-21 ENCOUNTER — Encounter (HOSPITAL_COMMUNITY): Payer: Self-pay

## 2016-10-21 ENCOUNTER — Inpatient Hospital Stay (HOSPITAL_COMMUNITY): Payer: Medicare Other

## 2016-10-21 LAB — GLUCOSE, CAPILLARY
GLUCOSE-CAPILLARY: 184 mg/dL — AB (ref 65–99)
GLUCOSE-CAPILLARY: 229 mg/dL — AB (ref 65–99)
Glucose-Capillary: 198 mg/dL — ABNORMAL HIGH (ref 65–99)

## 2016-10-21 LAB — BASIC METABOLIC PANEL
Anion gap: 6 (ref 5–15)
BUN: 20 mg/dL (ref 6–20)
CALCIUM: 8.4 mg/dL — AB (ref 8.9–10.3)
CHLORIDE: 108 mmol/L (ref 101–111)
CO2: 20 mmol/L — AB (ref 22–32)
Creatinine, Ser: 0.84 mg/dL (ref 0.44–1.00)
GFR calc non Af Amer: >60 mL/min (ref >60)
GLUCOSE: 166 mg/dL — AB (ref 65–99)
Potassium: 4.2 mmol/L (ref 3.5–5.1)
Sodium: 134 mmol/L — ABNORMAL LOW (ref 135–145)

## 2016-10-21 LAB — CBC
HCT: 23.2 % — ABNORMAL LOW (ref 36.0–46.0)
Hemoglobin: 7.7 g/dL — ABNORMAL LOW (ref 12.0–15.0)
MCH: 28.1 pg (ref 26.0–34.0)
MCHC: 33.2 g/dL (ref 30.0–36.0)
MCV: 84.7 fL (ref 78.0–100.0)
Platelets: 294 10*3/uL (ref 150–400)
RBC: 2.74 MIL/uL — ABNORMAL LOW (ref 3.87–5.11)
RDW: 13.3 % (ref 11.5–15.5)
WBC: 11.4 10*3/uL — ABNORMAL HIGH (ref 4.0–10.5)

## 2016-10-21 LAB — MRSA PCR SCREENING: MRSA by PCR: NEGATIVE

## 2016-10-21 MED ORDER — SENNA 8.6 MG PO TABS
1.0000 | ORAL_TABLET | Freq: Two times a day (BID) | ORAL | Status: DC | PRN
  Administered 2016-10-21: 8.6 mg via ORAL
  Filled 2016-10-21: qty 1

## 2016-10-21 NOTE — Progress Notes (Signed)
PULMONARY / CRITICAL CARE MEDICINE   Name: [name] [name] MRN: [id number] DOB: 26-Jan-1951    ADMISSION DATE:  10/17/2016 CONSULTATION DATE:  10/17/2016  REFERRING MD:  Rapid Response  CHIEF COMPLAINT:  Hypoxic respiratory distress  HISTORY OF PRESENT ILLNESS:   66 year old female with   DMT2, PAD, PVD, HTN, HLD, GERD, CAD w/prior MI 2006, former smoker (quit 2011), and CHF (EF 50-55%, G1DD on 10/2015) admitted on 9/20 with gangrene of her right foot- great and second toes.   She previously had left femoral to below-knee popliteal bypass by Dr. Edilia Bo on 10/2015.   She has taken one week of two week course of Bactrim prior to admit after being seen in the office for non-healing right foot wound and discoloration.  Underwent an arteriogram by Dr. Allyson Sabal on 9/20 showing a right common femoral artery occlusion and superficial femoral artery occlusion.  Patient   refused amputation.  Surgery scheduled 9/21 for a right common femoral artery endarterectomy and right femoral to below knee popliteal artery bypass.  On the evening of 9/20, she became acutely short of breath, febrile 101.8, tachycardic, and hypoxic on telemetry, failing NRB, requiring BiPAP.  PCCM called for ongoing hypoxic respiratory distress.    SUBJECTIVE:  No acute events.  SpO2 98% on 2L.  VITAL SIGNS: BP (!) 159/70 (BP Location: Right Arm)   Pulse 69   Temp 99 F (37.2 C) (Oral)   Resp (!) 21   Ht  (1.626 m)   Wt 78.6 kg (173 lb 4.5 oz)   SpO2 99%   BMI 29.74 kg/m       INTAKE / OUTPUT: I/O last 3 completed shifts: In: -  Out: 1350 [Urine:1350]  PHYSICAL EXAMINATION:  General:  Adult female, no distress HEENT: MM pink/moist, no jvd/lan PSY: nl affect Neuro: intact, no deficits CV: HSR RRR PULM: even/non-labored, Clear bilaterally WU:JWJX, non-tender, bsx4 active  Extremities: warm/dry, rt foot with dressing intact Skin: no rashes or lesions, R foot and ankle wrapped in curlex  dressing  LABS:  BMET  Recent Labs Lab 10/18/16 0355 10/19/16 0343 10/21/16 0215  NA 136 134* 134*  K 5.4* 5.1 4.2  CL 107 106 108  CO2 19* 20* 20*  BUN 25* 33* 20  CREATININE 1.32* 1.20* 0.84  GLUCOSE 219* 170* 166*    Electrolytes  Recent Labs Lab 10/18/16 0355 10/19/16 0343 10/21/16 0215  CALCIUM 8.8* 8.6* 8.4*  MG  --  1.6*  --   PHOS  --  3.6  --     CBC  Recent Labs Lab 10/18/16 0355 10/19/16 0343 10/21/16 0215  WBC 14.2* 18.1* 11.4*  HGB 8.9* 8.1* 7.7*  HCT 27.0* 24.4* 23.2*  PLT 308 341 294    Coag's  Recent Labs Lab 10/17/16 0634  INR 1.23    Sepsis Markers  Recent Labs Lab 10/17/16 2230 10/18/16 0149 10/19/16 0343 10/20/16 0427  LATICACIDVEN 1.4 0.7  --   --   PROCALCITON  --  0.10 0.20 <0.10    ABG  Recent Labs Lab 10/18/16 0130  PHART 7.376  PCO2ART 32.9  PO2ART 86.9    Liver Enzymes No results for input(s): AST, ALT, ALKPHOS, BILITOT, ALBUMIN in the last 168 hours.  Cardiac Enzymes  Recent Labs Lab 10/17/16 2230 10/18/16 0355 10/18/16 0957  TROPONINI 0.03* 0.06* 0.04*    Glucose  Recent Labs Lab 10/20/16 0017 10/20/16 0409 10/20/16 0830 10/20/16 1122 10/20/16 1636 10/20/16 2222  GLUCAP 133* 148* 98 97  152* 151*    Imaging No results found.   STUDIES:  9/20 arteriogram >> shows that the common iliac and external iliac arteries are patent. There is an occlusion of the common femoral artery and superficial femoral artery. There is reconstitution of the below-knee popliteal artery. There is 2 vessel runoff via the posterior tibial artery and peroneal arteries. The posterior tibial artery is the dominant runoff CXR 9/21 > CHF with interstitial edema.  CULTURES: BC 9/20>>     ANTIBIOTICS: maxepime 9/20 >   Vanc 9/20 >  SIGNIFICANT EVENTS: 9/20 Admit, arteriogram, respiratory distress> resolved 9/21      LINES/TUBES: PIV x 2  DISCUSSION: 41 yoF w/extensive prior vascular disease admitted  with gangrene and occluded common femoral artery and superficial femoral artery, tentatively scheduled for fem-pop bypass 9/21 who went into acute hypoxic respiratory distress, likely flash pulm edema, requiring BiPAP and transfer to ICU. 9/21 much improved with NIMVS x overnight then off am 9/21 p negative I/o.  ASSESSMENT / PLAN:  Acute hypoxic respiratory failure- likely multifactoral- but ddx flash pulmonary edema vs PE (Wells score indeterminate/low)- hypoxia improved w/BiPAP, vs PNA Former smoker (41 pack year hx, not on home nebs) Suspected undiagnosed OSA P:   Continue supplemental O2 as needed to maintain SpO2 > 92% Needs ambulatory desat study prior to d/c to assess for home O2 needs Xopenex and atrovent nebs q 6hr prn  Continue empiric vanc, cefepime Repeat CXR today  Gangrene of R foot w/multilevel arterial occlusive disease and concern for osteomyelitis CAD, HF (10/17 EF 50-55%, G1DD), PVD, PAD, HLD P:  Vascular managing ASA and plavix per VVS Will need imaging of right foot to confirm/ rule out osteo per surgery Continue empiric vanc, cefepime   Nothing further to add.  PCCM will sign off.  Please do not hesitate to call us back if we can be of any further assistance.    Rutherford Guys, [place] - C  Pulmonary & Critical Care Medicine Pager: [phone]  or [phone] 10/21/2016, 9:08 AM

## 2016-10-21 NOTE — Care Management Important Message (Signed)
Important Message  Patient Details  Name: [name] [name] MRN: [id number] Date of Birth: 07-14-1950   Medicare Important Message Given:  Yes    Alechia Lezama Abena 10/21/2016, 10:12 AM

## 2016-10-21 NOTE — Progress Notes (Signed)
   VASCULAR SURGERY ASSESSMENT & PLAN:   Cardiology has cleared the patient for surgery. I have added her onto the schedule tomorrow for right common femoral artery endarterectomy, right femoral to below-knee popliteal artery bypass, and amputation of the right first second and third toes with possible right transmetatarsal amputation.  I have reviewed the indications for lower extremity bypass. I have also reviewed the potential complications of surgery including but not limited to: wound healing problems, infection, graft thrombosis, limb loss, or other unpredictable medical problems. All the patient's questions were answered and they are agreeable to proceed.  I think that the patient is at increased risk for postoperative pulmonary complications and we will reconsult them as needed postoperatively.  SUBJECTIVE:   No specific complaints this morning.  PHYSICAL EXAM:   Vitals:   10/20/16 0830 10/20/16 1639 10/20/16 2226 10/21/16 0500  BP:  136/60 (!) 145/61 (!) 159/70  Pulse:   73 69  Resp:      Temp: 98.8 F (37.1 C)  98.4 F (36.9 C) 99 F (37.2 C)  TempSrc: Oral  Oral Oral  SpO2:   92% 99%  Weight:      Height:       No change in gangrene of the right foot.  LABS:   Lab Results  Component Value Date   WBC 11.4 (H) 10/21/2016   HGB 7.7 (L) 10/21/2016   HCT 23.2 (L) 10/21/2016   MCV 84.7 10/21/2016   PLT 294 10/21/2016   Lab Results  Component Value Date   CREATININE 0.84 10/21/2016   Lab Results  Component Value Date   INR 1.23 10/17/2016   CBG (last 3)   Recent Labs  10/20/16 1122 10/20/16 1636 10/20/16 2222  GLUCAP 97 152* 151*    PROBLEM LIST:    Principal Problem:   PAD (peripheral artery disease) (HCC) Active Problems:   Acute respiratory failure with hypoxia (HCC)   Gangrene of left foot (HCC)   Sepsis (HCC)   CURRENT MEDS:   . aspirin  81 mg Oral Daily  . heparin subcutaneous  5,000 Units Subcutaneous Q8H  . insulin aspart  0-20  Units Subcutaneous Q4H  . insulin detemir  10 Units Subcutaneous QHS  . isosorbide mononitrate  90 mg Oral QHS  . metoprolol succinate  75 mg Oral Daily  . pneumococcal 23 valent vaccine  0.5 mL Intramuscular Tomorrow-1000  . sodium chloride flush  3 mL Intravenous Q12H    Chris Dickson Beeper: [phone] Office: [phone] 10/21/2016  

## 2016-10-21 NOTE — Care Management Note (Signed)
Case Management Note Donn Pierini RN, BSN Unit 4E-Case Manager [phone]  Patient Details  Name: [name] [name] MRN: [id number] Date of Birth: 03/30/1950  Subjective/Objective:  Pt admitted with PAD, plan for right common femoral artery endarterectomy, right femoral to below-knee popliteal artery bypass, and amputation of the right first second and third toes with possible right transmetatarsal amputation on 10/22/16                 Action/Plan: PTA pt lived at home- CM will follow post op for d/c needs   Expected Discharge Date:                  Expected Discharge Plan:     In-House Referral:     Discharge planning Services  CM Consult  Post Acute Care Choice:    Choice offered to:     DME Arranged:    DME Agency:     HH Arranged:    HH Agency:     Status of Service:  In process, will continue to follow  If discussed at Long Length of Stay Meetings, dates discussed:    Discharge Disposition:   Additional Comments:  Darrold Span, RN 10/21/2016, 10:23 AM

## 2016-10-22 ENCOUNTER — Inpatient Hospital Stay (HOSPITAL_COMMUNITY): Payer: Medicare Other

## 2016-10-22 ENCOUNTER — Encounter (HOSPITAL_COMMUNITY)
Admission: RE | Disposition: A | Discharge status: Skilled Nursing Facility | Payer: Self-pay | Source: Ambulatory Visit | Attending: Vascular Surgery

## 2016-10-22 ENCOUNTER — Inpatient Hospital Stay (HOSPITAL_COMMUNITY): Payer: Medicare Other | Admitting: Certified Registered Nurse Anesthetist

## 2016-10-22 HISTORY — PX: AMPUTATION: SHX166

## 2016-10-22 HISTORY — PX: FEMORAL-POPLITEAL BYPASS GRAFT: SHX937

## 2016-10-22 HISTORY — PX: THROMBECTOMY FEMORAL ARTERY: SHX6406

## 2016-10-22 HISTORY — PX: ENDARTERECTOMY FEMORAL: SHX5804

## 2016-10-22 HISTORY — PX: INTRAOPERATIVE ARTERIOGRAM: SHX5157

## 2016-10-22 LAB — GLUCOSE, CAPILLARY
GLUCOSE-CAPILLARY: 185 mg/dL — AB (ref 65–99)
GLUCOSE-CAPILLARY: 220 mg/dL — AB (ref 65–99)
Glucose-Capillary: 113 mg/dL — ABNORMAL HIGH (ref 65–99)
Glucose-Capillary: 117 mg/dL — ABNORMAL HIGH (ref 65–99)
Glucose-Capillary: 211 mg/dL — ABNORMAL HIGH (ref 65–99)

## 2016-10-22 LAB — BPAM RBC
BLOOD PRODUCT EXPIRATION DATE: 201809252359
BLOOD PRODUCT EXPIRATION DATE: 201810092359
ISSUE DATE / TIME: 201809140811
ISSUE DATE / TIME: 201809181254
UNIT TYPE AND RH: 600
UNIT TYPE AND RH: 6200

## 2016-10-22 LAB — TYPE AND SCREEN
ABO/RH(D): A POS
ANTIBODY SCREEN: NEGATIVE
UNIT DIVISION: 0
Unit division: 0

## 2016-10-22 LAB — CBC
HEMATOCRIT: 24.2 % — AB (ref 36.0–46.0)
Hemoglobin: 7.8 g/dL — ABNORMAL LOW (ref 12.0–15.0)
MCH: 27.8 pg (ref 26.0–34.0)
MCHC: 32.2 g/dL (ref 30.0–36.0)
MCV: 86.1 fL (ref 78.0–100.0)
Platelets: 331 10*3/uL (ref 150–400)
RBC: 2.81 MIL/uL — AB (ref 3.87–5.11)
RDW: 13.3 % (ref 11.5–15.5)
WBC: 10.6 10*3/uL — AB (ref 4.0–10.5)

## 2016-10-22 LAB — BASIC METABOLIC PANEL
Anion gap: 6 (ref 5–15)
BUN: 21 mg/dL — ABNORMAL HIGH (ref 6–20)
CHLORIDE: 109 mmol/L (ref 101–111)
CO2: 22 mmol/L (ref 22–32)
Calcium: 8.3 mg/dL — ABNORMAL LOW (ref 8.9–10.3)
Creatinine, Ser: 1.01 mg/dL — ABNORMAL HIGH (ref 0.44–1.00)
GFR calc non Af Amer: 57 mL/min — ABNORMAL LOW (ref >60)
Glucose, Bld: 98 mg/dL (ref 65–99)
POTASSIUM: 4.4 mmol/L (ref 3.5–5.1)
Sodium: 137 mmol/L (ref 135–145)

## 2016-10-22 LAB — PREPARE RBC (CROSSMATCH)

## 2016-10-22 LAB — PROTIME-INR
INR: 1.24
Prothrombin Time: 15.5 seconds — ABNORMAL HIGH (ref 11.4–15.2)

## 2016-10-22 SURGERY — ENDARTERECTOMY, FEMORAL
Anesthesia: General | Site: Leg Upper | Laterality: Right

## 2016-10-22 MED ORDER — DEXAMETHASONE SODIUM PHOSPHATE 10 MG/ML IJ SOLN
INTRAMUSCULAR | Status: AC
  Filled 2016-10-22: qty 1

## 2016-10-22 MED ORDER — SUGAMMADEX SODIUM 200 MG/2ML IV SOLN
INTRAVENOUS | Status: AC
  Filled 2016-10-22: qty 2

## 2016-10-22 MED ORDER — PROTAMINE SULFATE 10 MG/ML IV SOLN
INTRAVENOUS | Status: DC | PRN
  Administered 2016-10-22: 40 mg via INTRAVENOUS

## 2016-10-22 MED ORDER — SUGAMMADEX SODIUM 200 MG/2ML IV SOLN
INTRAVENOUS | Status: DC | PRN
  Administered 2016-10-22: 156 mg via INTRAVENOUS

## 2016-10-22 MED ORDER — ONDANSETRON HCL 4 MG/2ML IJ SOLN
INTRAMUSCULAR | Status: AC
  Filled 2016-10-22: qty 2

## 2016-10-22 MED ORDER — IODIXANOL 320 MG/ML IV SOLN
INTRAVENOUS | Status: DC | PRN
  Administered 2016-10-22: 17 mL via INTRAMUSCULAR

## 2016-10-22 MED ORDER — OXYCODONE-ACETAMINOPHEN 5-325 MG PO TABS
1.0000 | ORAL_TABLET | ORAL | Status: DC | PRN
  Administered 2016-10-22 – 2016-10-28 (×19): 2 via ORAL
  Filled 2016-10-22 (×19): qty 2

## 2016-10-22 MED ORDER — ALUM & MAG HYDROXIDE-SIMETH 200-200-20 MG/5ML PO SUSP: 15.0000 mL | ORAL | Status: DC | PRN

## 2016-10-22 MED ORDER — DIPHENHYDRAMINE HCL 50 MG/ML IJ SOLN
INTRAMUSCULAR | Status: AC
  Filled 2016-10-22: qty 1

## 2016-10-22 MED ORDER — MIDAZOLAM HCL 2 MG/2ML IJ SOLN
INTRAMUSCULAR | Status: AC
  Filled 2016-10-22: qty 2

## 2016-10-22 MED ORDER — BACITRACIN ZINC 500 UNIT/GM EX OINT
TOPICAL_OINTMENT | CUTANEOUS | Status: AC
  Filled 2016-10-22: qty 28.35

## 2016-10-22 MED ORDER — POTASSIUM CHLORIDE CRYS ER 20 MEQ PO TBCR: 20.0000 meq | EXTENDED_RELEASE_TABLET | Freq: Every day | ORAL | Status: DC | PRN

## 2016-10-22 MED ORDER — PROPOFOL 10 MG/ML IV BOLUS
INTRAVENOUS | Status: DC | PRN
  Administered 2016-10-22: 100 mg via INTRAVENOUS
  Administered 2016-10-22: 30 mg via INTRAVENOUS

## 2016-10-22 MED ORDER — FENTANYL CITRATE (PF) 100 MCG/2ML IJ SOLN
INTRAMUSCULAR | Status: DC | PRN
  Administered 2016-10-22 (×3): 50 ug via INTRAVENOUS
  Administered 2016-10-22: 100 ug via INTRAVENOUS

## 2016-10-22 MED ORDER — GUAIFENESIN-DM 100-10 MG/5ML PO SYRP: 15.0000 mL | ORAL_SOLUTION | ORAL | Status: DC | PRN

## 2016-10-22 MED ORDER — LIDOCAINE 2% (20 MG/ML) 5 ML SYRINGE
INTRAMUSCULAR | Status: DC | PRN
  Administered 2016-10-22: 80 mg via INTRAVENOUS

## 2016-10-22 MED ORDER — SODIUM CHLORIDE 0.9 % IV SOLN: 500.0000 mL | Freq: Once | INTRAVENOUS | Status: DC | PRN

## 2016-10-22 MED ORDER — PHENYLEPHRINE HCL 10 MG/ML IJ SOLN
INTRAVENOUS | Status: DC | PRN
  Administered 2016-10-22: 20 ug/min via INTRAVENOUS

## 2016-10-22 MED ORDER — ONDANSETRON HCL 4 MG/2ML IJ SOLN: 4.0000 mg | Freq: Once | INTRAMUSCULAR | Status: DC | PRN

## 2016-10-22 MED ORDER — ONDANSETRON HCL 4 MG/2ML IJ SOLN
INTRAMUSCULAR | Status: DC | PRN
  Administered 2016-10-22: 4 mg via INTRAVENOUS

## 2016-10-22 MED ORDER — PHENYLEPHRINE 40 MCG/ML (10ML) SYRINGE FOR IV PUSH (FOR BLOOD PRESSURE SUPPORT)
PREFILLED_SYRINGE | INTRAVENOUS | Status: DC | PRN
  Administered 2016-10-22: 80 ug via INTRAVENOUS

## 2016-10-22 MED ORDER — FENTANYL CITRATE (PF) 100 MCG/2ML IJ SOLN
25.0000 ug | INTRAMUSCULAR | Status: DC | PRN
  Administered 2016-10-22 (×2): 50 ug via INTRAVENOUS

## 2016-10-22 MED ORDER — SODIUM CHLORIDE 0.9 % IV SOLN
INTRAVENOUS | Status: DC | PRN
  Administered 2016-10-22: 500 mL

## 2016-10-22 MED ORDER — PROPOFOL 10 MG/ML IV BOLUS
INTRAVENOUS | Status: AC
  Filled 2016-10-22: qty 20

## 2016-10-22 MED ORDER — PAPAVERINE HCL 30 MG/ML IJ SOLN
INTRAMUSCULAR | Status: AC
  Filled 2016-10-22: qty 2

## 2016-10-22 MED ORDER — PHENOL 1.4 % MT LIQD: 1.0000 | OROMUCOSAL | Status: DC | PRN

## 2016-10-22 MED ORDER — PAPAVERINE HCL 30 MG/ML IJ SOLN
INTRAMUSCULAR | Status: DC | PRN
  Administered 2016-10-22 (×2): 60 mg via INTRAVENOUS

## 2016-10-22 MED ORDER — PANTOPRAZOLE SODIUM 40 MG PO TBEC
40.0000 mg | DELAYED_RELEASE_TABLET | Freq: Every day | ORAL | Status: DC
  Administered 2016-10-23 – 2016-10-28 (×6): 40 mg via ORAL
  Filled 2016-10-22 (×6): qty 1

## 2016-10-22 MED ORDER — FENTANYL CITRATE (PF) 100 MCG/2ML IJ SOLN
INTRAMUSCULAR | Status: AC
  Administered 2016-10-22: 50 ug via INTRAVENOUS
  Filled 2016-10-22: qty 2

## 2016-10-22 MED ORDER — DEXAMETHASONE SODIUM PHOSPHATE 10 MG/ML IJ SOLN
INTRAMUSCULAR | Status: DC | PRN
  Administered 2016-10-22: 5 mg via INTRAVENOUS

## 2016-10-22 MED ORDER — ROCURONIUM BROMIDE 100 MG/10ML IV SOLN
INTRAVENOUS | Status: DC | PRN
  Administered 2016-10-22: 60 mg via INTRAVENOUS
  Administered 2016-10-22: 50 mg via INTRAVENOUS
  Administered 2016-10-22: 40 mg via INTRAVENOUS
  Administered 2016-10-22: 10 mg via INTRAVENOUS
  Administered 2016-10-22: 20 mg via INTRAVENOUS

## 2016-10-22 MED ORDER — LABETALOL HCL 5 MG/ML IV SOLN
10.0000 mg | INTRAVENOUS | Status: DC | PRN
  Administered 2016-10-22 – 2016-10-28 (×3): 10 mg via INTRAVENOUS
  Filled 2016-10-22 (×3): qty 4

## 2016-10-22 MED ORDER — PROTAMINE SULFATE 10 MG/ML IV SOLN
INTRAVENOUS | Status: AC
  Filled 2016-10-22: qty 25

## 2016-10-22 MED ORDER — MAGNESIUM SULFATE 2 GM/50ML IV SOLN
2.0000 g | Freq: Every day | INTRAVENOUS | Status: DC | PRN
  Filled 2016-10-22: qty 50

## 2016-10-22 MED ORDER — SODIUM CHLORIDE 0.9 % IV SOLN: INTRAVENOUS | Status: DC

## 2016-10-22 MED ORDER — 0.9 % SODIUM CHLORIDE (POUR BTL) OPTIME
TOPICAL | Status: DC | PRN
  Administered 2016-10-22: 3000 mL

## 2016-10-22 MED ORDER — DIPHENHYDRAMINE HCL 50 MG/ML IJ SOLN
INTRAMUSCULAR | Status: DC | PRN
  Administered 2016-10-22: 25 mg via INTRAVENOUS

## 2016-10-22 MED ORDER — DOCUSATE SODIUM 100 MG PO CAPS
100.0000 mg | ORAL_CAPSULE | Freq: Every day | ORAL | Status: DC
  Administered 2016-10-23 – 2016-10-28 (×6): 100 mg via ORAL
  Filled 2016-10-22 (×6): qty 1

## 2016-10-22 MED ORDER — LACTATED RINGERS IV SOLN
INTRAVENOUS | Status: DC | PRN
  Administered 2016-10-22: 07:00:00 via INTRAVENOUS

## 2016-10-22 MED ORDER — PHENYLEPHRINE 40 MCG/ML (10ML) SYRINGE FOR IV PUSH (FOR BLOOD PRESSURE SUPPORT)
PREFILLED_SYRINGE | INTRAVENOUS | Status: AC
  Filled 2016-10-22: qty 10

## 2016-10-22 MED ORDER — HEPARIN SODIUM (PORCINE) 1000 UNIT/ML IJ SOLN
INTRAMUSCULAR | Status: DC | PRN
  Administered 2016-10-22: 2000 [IU] via INTRAVENOUS
  Administered 2016-10-22: 8000 [IU] via INTRAVENOUS

## 2016-10-22 MED ORDER — LIDOCAINE 2% (20 MG/ML) 5 ML SYRINGE
INTRAMUSCULAR | Status: AC
  Filled 2016-10-22: qty 5

## 2016-10-22 MED ORDER — BACITRACIN ZINC 500 UNIT/GM EX OINT
TOPICAL_OINTMENT | CUTANEOUS | Status: DC | PRN
  Administered 2016-10-22: 1 via TOPICAL

## 2016-10-22 MED ORDER — SODIUM CHLORIDE 0.9 % IV SOLN: 10.0000 mL/h | Freq: Once | INTRAVENOUS | Status: AC

## 2016-10-22 MED ORDER — HYDRALAZINE HCL 20 MG/ML IJ SOLN: 5.0000 mg | INTRAMUSCULAR | Status: DC | PRN

## 2016-10-22 MED ORDER — HEPARIN SODIUM (PORCINE) 1000 UNIT/ML IJ SOLN
INTRAMUSCULAR | Status: AC
  Filled 2016-10-22: qty 1

## 2016-10-22 MED ORDER — ROCURONIUM BROMIDE 10 MG/ML (PF) SYRINGE
PREFILLED_SYRINGE | INTRAVENOUS | Status: AC
  Filled 2016-10-22: qty 5

## 2016-10-22 MED ORDER — METOPROLOL TARTRATE 5 MG/5ML IV SOLN: 2.0000 mg | INTRAVENOUS | Status: DC | PRN

## 2016-10-22 MED ORDER — FENTANYL CITRATE (PF) 250 MCG/5ML IJ SOLN
INTRAMUSCULAR | Status: AC
  Filled 2016-10-22: qty 5

## 2016-10-22 MED ORDER — MIDAZOLAM HCL 2 MG/2ML IJ SOLN
INTRAMUSCULAR | Status: DC | PRN
  Administered 2016-10-22: 2 mg via INTRAVENOUS

## 2016-10-22 SURGICAL SUPPLY — 78 items
ADH SKN CLS APL DERMABOND .7 (GAUZE/BANDAGES/DRESSINGS) ×4
BANDAGE ACE 4X5 VEL STRL LF (GAUZE/BANDAGES/DRESSINGS) ×4 IMPLANT
BANDAGE ELASTIC 4 VELCRO ST LF (GAUZE/BANDAGES/DRESSINGS) ×6 IMPLANT
BANDAGE ESMARK 6X9 LF (GAUZE/BANDAGES/DRESSINGS) IMPLANT
BLADE AVERAGE 25MMX9MM (BLADE) ×1
BLADE AVERAGE 25X9 (BLADE) ×1 IMPLANT
BNDG CMPR 9X6 STRL LF SNTH (GAUZE/BANDAGES/DRESSINGS) ×2
BNDG ESMARK 6X9 LF (GAUZE/BANDAGES/DRESSINGS) ×4
BNDG GAUZE ELAST 4 BULKY (GAUZE/BANDAGES/DRESSINGS) ×4 IMPLANT
CANISTER SUCT 3000ML PPV (MISCELLANEOUS) ×4 IMPLANT
CANNULA VESSEL 3MM 2 BLNT TIP (CANNULA) ×8 IMPLANT
CATH EMB 4FR 40CM (CATHETERS) ×2 IMPLANT
CLIP VESOCCLUDE MED 24/CT (CLIP) ×4 IMPLANT
CLIP VESOCCLUDE SM WIDE 24/CT (CLIP) ×8 IMPLANT
COVER SURGICAL LIGHT HANDLE (MISCELLANEOUS) ×4 IMPLANT
CUFF TOURNIQUET SINGLE 24IN (TOURNIQUET CUFF) IMPLANT
CUFF TOURNIQUET SINGLE 34IN LL (TOURNIQUET CUFF) ×2 IMPLANT
CUFF TOURNIQUET SINGLE 44IN (TOURNIQUET CUFF) IMPLANT
DERMABOND ADVANCED (GAUZE/BANDAGES/DRESSINGS) ×4
DERMABOND ADVANCED .7 DNX12 (GAUZE/BANDAGES/DRESSINGS) ×2 IMPLANT
DRAIN CHANNEL 15F RND FF W/TCR (WOUND CARE) IMPLANT
DRAPE EXTREMITY T 121X128X90 (DRAPE) ×4 IMPLANT
DRAPE HALF SHEET 40X57 (DRAPES) ×6 IMPLANT
DRAPE X-RAY CASS 24X20 (DRAPES) ×2 IMPLANT
ELECT REM PT RETURN 9FT ADLT (ELECTROSURGICAL) ×4
ELECTRODE REM PT RTRN 9FT ADLT (ELECTROSURGICAL) ×2 IMPLANT
EVACUATOR SILICONE 100CC (DRAIN) IMPLANT
GAUZE SPONGE 4X4 12PLY STRL (GAUZE/BANDAGES/DRESSINGS) ×4 IMPLANT
GAUZE SPONGE 4X4 16PLY XRAY LF (GAUZE/BANDAGES/DRESSINGS) ×2 IMPLANT
GLOVE BIO SURGEON STRL SZ7.5 (GLOVE) ×4 IMPLANT
GLOVE BIOGEL PI IND STRL 6.5 (GLOVE) IMPLANT
GLOVE BIOGEL PI IND STRL 7.0 (GLOVE) IMPLANT
GLOVE BIOGEL PI IND STRL 7.5 (GLOVE) IMPLANT
GLOVE BIOGEL PI IND STRL 8 (GLOVE) ×2 IMPLANT
GLOVE BIOGEL PI INDICATOR 6.5 (GLOVE) ×6
GLOVE BIOGEL PI INDICATOR 7.0 (GLOVE) ×2
GLOVE BIOGEL PI INDICATOR 7.5 (GLOVE) ×4
GLOVE BIOGEL PI INDICATOR 8 (GLOVE) ×2
GLOVE INDICATOR 7.5 STRL GRN (GLOVE) ×2 IMPLANT
GLOVE SURG SS PI 6.5 STRL IVOR (GLOVE) ×10 IMPLANT
GLOVE SURG SS PI 7.0 STRL IVOR (GLOVE) ×2 IMPLANT
GOWN STRL REUS W/ TWL LRG LVL3 (GOWN DISPOSABLE) ×6 IMPLANT
GOWN STRL REUS W/TWL LRG LVL3 (GOWN DISPOSABLE) ×32
GOWN STRL REUS W/TWL XL LVL3 (GOWN DISPOSABLE) ×2 IMPLANT
KIT BASIN OR (CUSTOM PROCEDURE TRAY) ×4 IMPLANT
KIT ROOM TURNOVER OR (KITS) ×4 IMPLANT
MARKER GRAFT CORONARY BYPASS (MISCELLANEOUS) ×2 IMPLANT
MARKER SKIN DUAL TIP RULER LAB (MISCELLANEOUS) ×2 IMPLANT
NS IRRIG 1000ML POUR BTL (IV SOLUTION) ×8 IMPLANT
PACK GENERAL/GYN (CUSTOM PROCEDURE TRAY) ×4 IMPLANT
PACK PERIPHERAL VASCULAR (CUSTOM PROCEDURE TRAY) ×4 IMPLANT
PAD ARMBOARD 7.5X6 YLW CONV (MISCELLANEOUS) ×8 IMPLANT
SET COLLECT BLD 21X3/4 12 (NEEDLE) ×2 IMPLANT
SPONGE INTESTINAL PEANUT (DISPOSABLE) ×2 IMPLANT
SPONGE LAP 18X18 X RAY DECT (DISPOSABLE) ×2 IMPLANT
SPONGE SURGIFOAM ABS GEL 100 (HEMOSTASIS) IMPLANT
STAPLER VISISTAT (STAPLE) IMPLANT
STOPCOCK 4 WAY LG BORE MALE ST (IV SETS) ×2 IMPLANT
STRIP PERIGUARD 6X8 (Vascular Products) ×2 IMPLANT
SUT ETHILON 3 0 PS 1 (SUTURE) ×10 IMPLANT
SUT PROLENE 5 0 C 1 24 (SUTURE) ×6 IMPLANT
SUT PROLENE 6 0 BV (SUTURE) ×24 IMPLANT
SUT SILK 2 0 FS (SUTURE) ×4 IMPLANT
SUT SILK 2 0 SH (SUTURE) ×4 IMPLANT
SUT SILK 3 0 (SUTURE) ×4
SUT SILK 3-0 18XBRD TIE 12 (SUTURE) IMPLANT
SUT VIC AB 2-0 CTB1 (SUTURE) ×10 IMPLANT
SUT VIC AB 3-0 SH 27 (SUTURE) ×20
SUT VIC AB 3-0 SH 27X BRD (SUTURE) ×4 IMPLANT
SUT VIC AB 4-0 PS2 18 (SUTURE) ×2 IMPLANT
SUT VICRYL 4-0 PS2 18IN ABS (SUTURE) ×12 IMPLANT
SYR 30ML LL (SYRINGE) ×2 IMPLANT
SYR 3ML LL SCALE MARK (SYRINGE) ×2 IMPLANT
TOWEL GREEN STERILE (TOWEL DISPOSABLE) ×4 IMPLANT
TRAY FOLEY W/METER SILVER 16FR (SET/KITS/TRAYS/PACK) ×4 IMPLANT
TUBING EXTENTION W/L.L. (IV SETS) ×2 IMPLANT
UNDERPAD 30X30 (UNDERPADS AND DIAPERS) ×4 IMPLANT
WATER STERILE IRR 1000ML POUR (IV SOLUTION) ×4 IMPLANT

## 2016-10-22 NOTE — Consult Note (Signed)
``  [facility] CM Primary Care Navigator  10/22/2016  [name] [name] Jul 06, 1950 [id number]   Went to see patient at the bedsideto identify possible discharge needs but RN reports that sheis off the unit in OR for surgery at this time. (Right common femoral artery endarterectomy, right femoral to below-knee popliteal artery bypass, and amputation of the right first second and third toes with possible right transmetatarsal amputation)    Will attemptto see patient at another time when she is available in the room.     Addendum: (10/23/16):  Met withpatient at the bedside to identify possible discharge needs.  Patientreports having increased pain to right foot with black discoloration to right great toe, second and third toes which had led to this admission/ surgery.   Patient endorses Dr. Wenda [name] with [facility] as herprimary care provider.   Patient shared using [facility] pharmacy on [address] to obtain medications without difficulty.   Patient states that she manages her medications at home with some help from daughter [name] [name]) using "pill box" system filled every 2 weeks.   Patient reports that daughter providestransportation toherdoctors'appointments.  Patient lives with daughter who serves as the primary caregiver at home per patient.  Discharge planis pending therapy evaluation/ recommendation and physician order. Per RN report, possible skilled nursing facility (SNF) for rehabilitation.   Patientvoiced understanding to callprimary care provider's officewhen she returns back home,for a post discharge follow-upwithin a week or sooner if needed.Patient letter (with PCP's contact number) was provided as her reminder.  Explained to patient about [facility] CM services available for health management at home.Patient verbalized interest and expressed understanding to [facility] CM (further management of HF/ DM) from  primary care provider as deemed necessary and appropriate for servicesin the future- once she returns back home.   [facility] care management information provided for future needs that she may have.  Primary care provider's office is listed as doing transition of care (TOC).    For questions, please contact:  Dannielle Huh, BSN, RN- [facility] Primary Care Navigator  Telephone: [phone] [place]

## 2016-10-22 NOTE — Anesthesia Procedure Notes (Signed)
Arterial Line Insertion Start/End9/25/2018 7:05 AM, 10/22/2016 7:15 AM Performed by: Army Fossa, CRNA  Patient location: Pre-op. Preanesthetic checklist: patient identified, IV checked, site marked, risks and benefits discussed, surgical consent, monitors and equipment checked, pre-op evaluation, timeout performed and anesthesia consent Lidocaine 1% used for infiltration Left, radial was placed Catheter size: 20 Fr Hand hygiene performed  and maximum sterile barriers used  Allen's test indicative of satisfactory collateral circulation Attempts: 1 Procedure performed without using ultrasound guided technique. Ultrasound Notes:anatomy identified Following insertion, dressing applied and Biopatch. Post procedure assessment: normal and unchanged  Patient tolerated the procedure well with no immediate complications.

## 2016-10-22 NOTE — Interval H&P Note (Signed)
History and Physical Interval Note:  10/22/2016 7:19 AM  [name] [name]  has presented today for surgery, with the diagnosis of ischemic right leg  The various methods of treatment have been discussed with the patient and family. After consideration of risks, benefits and other options for treatment, the patient has consented to  Procedure(s): ENDARTERECTOMY RIGHT COMMON FEMORAL (Right) BYPASS GRAFT RIGHT FEMORAL- BELOW KNEE POPLITEAL ARTERY WITH VEIN (Right) AMPUTATION DIGIT RIGHT TOES 1-3 POSSIBLE TRANSMETATARSAL (Right) as a surgical intervention .  The patient's history has been reviewed, patient examined, no change in status, stable for surgery.  I have reviewed the patient's chart and labs.  Questions were answered to the patient's satisfaction.     Waverly Ferrari

## 2016-10-22 NOTE — H&P (View-Only) (Signed)
   VASCULAR SURGERY ASSESSMENT & PLAN:   Cardiology has cleared the patient for surgery. I have added her onto the schedule tomorrow for right common femoral artery endarterectomy, right femoral to below-knee popliteal artery bypass, and amputation of the right first second and third toes with possible right transmetatarsal amputation.  I have reviewed the indications for lower extremity bypass. I have also reviewed the potential complications of surgery including but not limited to: wound healing problems, infection, graft thrombosis, limb loss, or other unpredictable medical problems. All the patient's questions were answered and they are agreeable to proceed.  I think that the patient is at increased risk for postoperative pulmonary complications and we will reconsult them as needed postoperatively.  SUBJECTIVE:   No specific complaints this morning.  PHYSICAL EXAM:   Vitals:   10/20/16 0830 10/20/16 1639 10/20/16 2226 10/21/16 0500  BP:  136/60 (!) 145/61 (!) 159/70  Pulse:   73 69  Resp:      Temp: 98.8 F (37.1 C)  98.4 F (36.9 C) 99 F (37.2 C)  TempSrc: Oral  Oral Oral  SpO2:   92% 99%  Weight:      Height:       No change in gangrene of the right foot.  LABS:   Lab Results  Component Value Date   WBC 11.4 (H) 10/21/2016   HGB 7.7 (L) 10/21/2016   HCT 23.2 (L) 10/21/2016   MCV 84.7 10/21/2016   PLT 294 10/21/2016   Lab Results  Component Value Date   CREATININE 0.84 10/21/2016   Lab Results  Component Value Date   INR 1.23 10/17/2016   CBG (last 3)   Recent Labs  10/20/16 1122 10/20/16 1636 10/20/16 2222  GLUCAP 97 152* 151*    PROBLEM LIST:    Principal Problem:   PAD (peripheral artery disease) (HCC) Active Problems:   Acute respiratory failure with hypoxia (HCC)   Gangrene of left foot (HCC)   Sepsis (HCC)   CURRENT MEDS:   . aspirin  81 mg Oral Daily  . heparin subcutaneous  5,000 Units Subcutaneous Q8H  . insulin aspart  0-20  Units Subcutaneous Q4H  . insulin detemir  10 Units Subcutaneous QHS  . isosorbide mononitrate  90 mg Oral QHS  . metoprolol succinate  75 mg Oral Daily  . pneumococcal 23 valent vaccine  0.5 mL Intramuscular Tomorrow-1000  . sodium chloride flush  3 mL Intravenous Q12H    Cari Caraway Beeper: [phone] Office: [phone] 10/21/2016

## 2016-10-22 NOTE — Op Note (Signed)
NAME: [name] [name]    MRN: [id number] DOB: [birth year]    DATE OF OPERATION: 10/22/2016  PREOP DIAGNOSIS:    Critical limb ischemia right lower extremity  POSTOP DIAGNOSIS:    Same  PROCEDURE:    1. Right common femoral artery and external iliac artery endarterectomy with profundoplasty using bovine pericardial patch 2. Right femoral to below knee popliteal artery bypass with non-reversed translocated saphenous vein graft 3. Intraoperative arteriogram 4. Ray amputation of the right first second and third toes  SURGEON: Di Kindle. Edilia Bo, MD, FACS  ASSIST: Lianne Cure PA   ANESTHESIA: Gen.   EBL: Minimal   INDICATIONS:    [name] [name] is a 66 y.o. female Who presented with gangrene of her right first second and third toes. She had an occluded common femoral artery, superficial femoral artery and reconstitution of her below knee popliteal artery with two-vessel runoff via the posterior tibial and peroneal arteries. It was felt that her only chance for limb salvage was attempted revascularization.   FINDINGS:    Completion arteriogram showed no technical problems.  As anticipated, the external iliac artery was markedly calcified making it difficult to clamp and I had to clamp very high on the external iliac artery.   TECHNIQUE:   The patient was taken to the operating room and received a general anesthetic. The abdomen and entire right lower extremity and right foot were prepped and draped in usual sterile fashion. A longitudinal incision was made in the right groin. The common femoral artery was dissected free. This was markedly calcified and occluded. I dissected very high up under the inguinal ligament in order to find an area of the external iliac artery where I could potentially clamp. The artery was patent here but markedly calcified. Branches are controlled with red vessel loops. Next I dissected out the deep femoral artery. There was a medial and lateral branch  which were both controlled. The superficial femoral artery was chronically occluded.  Next the saphenofemoral junction was dissected free. Using 5 additional incisions along the medial aspect of the right leg great saphenous vein was harvested to the mid calf with branches divided between clips and 3-0 silk ties. Through the distal incision, the below-knee popliteal artery was exposed. The vein was ligated distally and then irrigated with heparinized saline. A tunnel was created from the below the knee incision to the groin incision and the patient was then heparinized.  A clamp was then placed on the external iliac artery very high and also the deep femoral artery was controlled. A longitudinal arteriotomy was made in the common femoral artery which was occluded with a large bulky calcific plaque. I was able to get above this and it was some bleeding which I attempted to control with the [place] but there was still some bleeding so I moved the clamp up even higher on the external iliac artery and was able to get proximal control. An endarterectomy plane was established in the common femoral artery and the plaque was divided. Endarterectomy was completed proximally and then distally down onto the deep femoral artery. There was nice tapering the plaque and the deep femoral artery and no tacking sutures were required. The artery was irrigated with cold smells of heparin and dextran all loose debris removed. The vein had become small and the cath and therefore did not have extra vein to use as a vein patch I therefore selected a bovine pericardial patch.  The profundoplasty was performed using a  bovine pericardial patch. This was sewn from the external iliac artery down onto the deep femoral artery. Prior to completing the patch closure the arteries were backbled and flushed appropriately and the anastomosis completed. Flow was reestablished to the right leg. Next the saphenofemoral junction was clamped and the  saphenous vein excised from the femoral vein. The femoral vein was oversewn with a 5-0 Prolene suture. The proximal valve was excised. Next the external iliac artery and deep femoral arteries were controlled again and a longitudinal arteriotomy made in the patch. The vein was sewn in a non-reverse fashion into side to the patch using continuous 6-0 Prolene suture. Prior to completing this anastomosis the arteries were backbled and flushed and the anastomosis completed.  Next I used a retrograde [place] valvulotome to lyse the valves in the great saphenous vein. Excellent flow was established through the vein. The vein was flushed with heparinized saline and clamped. The vein was clamped distally. It was then brought to the previously created tunnel after was marked with twisting. Tourniquet was placed on the thigh. The leg was exsanguinated with an Esmarch bandage and the tourniquet inflated to 300 mmHg. Under tourniquet control, a longitudinal arteriotomy was made in the low knee popliteal artery. The vein graft was cut to the appropriate length, spatulated and sewn end to side to the below-knee popliteal artery using continuous 6-0 Prolene suture. Prior to completing this anastomosis, the tourniquet was released. The arteries were backbled and flushed appropriately and the anastomosis completed. Flow was reestablished to the right leg. Completion arterial was then obtained by cannulating the proximal vein. This showed no technical problems. There is two-vessel runoff on the right via the posterior tibial and peroneal arteries.  Next the heparin was partially reversed with protamine. The wounds were closed in the groin with a deep layer of 2-0 Vicryl, subcutaneous layer of 3-0 Vicryl and the skin closed with 4-0 Vicryl. The vein harvest incisions were closed with deep layer of 3-0 Vicryl the skin closed with 4-0 Vicryl. The below the knee incision was closed with a deeper 2-0 Vicryl, subcutaneous layer with 3-0  Vicryl and the skin closed with 4-0 Vicryl. Dermabond was applied.  Attention was then turned to the right foot. An incision was made encompassing the first second and third toes. The dissection was carried down to the metatarsal heads which were exposed and then divided using a CD 4 saw. The sesamoid bones were excised. Hemostasis was obtained in the wound. This wound was then closed the deep inferior Vicryl and the skin closed with interrupted 30 nylons. Sterile dressing was applied. Patient tolerated the procedure well and was transferred to the recovery room in stable condition. All needle and sponge counts were correct.    Waverly Ferrari, MD, FACS [facility]  DATE OF DICTATION:   10/22/2016

## 2016-10-22 NOTE — Progress Notes (Signed)
Patient to O.R. Via bed w/ o2 at 2 l/m [place] With no complaints. Family has valubles

## 2016-10-22 NOTE — Anesthesia Procedure Notes (Signed)
Procedure Name: Intubation Date/Time: 10/22/2016 7:50 AM Performed by: Freddie Breech Pre-anesthesia Checklist: Patient identified, Emergency Drugs available, Suction available and Patient being monitored Patient Re-evaluated:Patient Re-evaluated prior to induction Oxygen Delivery Method: Circle System Utilized Preoxygenation: Pre-oxygenation with 100% oxygen Induction Type: IV induction Ventilation: Mask ventilation without difficulty Laryngoscope Size: Mac and 3 Grade View: Grade I Tube type: Oral Tube size: 7.5 mm Number of attempts: 1 Airway Equipment and Method: Stylet and Oral airway Placement Confirmation: ETT inserted through vocal cords under direct vision,  positive ETCO2 and breath sounds checked- equal and bilateral Secured at: 21 cm Tube secured with: Tape Dental Injury: Teeth and Oropharynx as per pre-operative assessment

## 2016-10-22 NOTE — Transfer of Care (Signed)
Immediate Anesthesia Transfer of Care Note  Patient: [name] [name]  Procedure(s) Performed: Procedure(s): ENDARTERECTOMY RIGHT COMMON FEMORAL (Right) BYPASS GRAFT RIGHT FEMORAL- BELOW KNEE POPLITEAL ARTERY WITH VEIN (Right) AMPUTATION DIGIT RIGHT TOES 1-3 POSSIBLE TRANSMETATARSAL (Right) INTRA OPERATIVE ARTERIOGRAM (Right) THROMBECTOMY FEMORAL ARTERY (Right)  Patient Location: PACU  Anesthesia Type:General  Level of Consciousness: drowsy and patient cooperative  Airway & Oxygen Therapy: Patient Spontanous Breathing and Patient connected to face mask oxygen  Post-op Assessment: Report given to RN and Post -op Vital signs reviewed and stable  Post vital signs: Reviewed and stable  Last Vitals:  Vitals:   10/22/16 0605 10/22/16 1327  BP: (!) 150/62   Pulse: 88   Resp: 15   Temp:  36.5 C  SpO2: 99%     Last Pain:  Vitals:   10/22/16 0543  TempSrc:   PainSc: 6       Patients Stated Pain Goal: 5 (10/21/16 2339)  Complications: No apparent anesthesia complications

## 2016-10-22 NOTE — Anesthesia Postprocedure Evaluation (Signed)
Anesthesia Post Note  Patient: [name] [name]  Procedure(s) Performed: Procedure(s) (LRB): ENDARTERECTOMY RIGHT COMMON FEMORAL (Right) BYPASS GRAFT RIGHT FEMORAL- BELOW KNEE POPLITEAL ARTERY WITH VEIN (Right) AMPUTATION DIGIT RIGHT TOES 1-3 POSSIBLE TRANSMETATARSAL (Right) INTRA OPERATIVE ARTERIOGRAM (Right) THROMBECTOMY FEMORAL ARTERY (Right)     Patient location during evaluation: PACU Anesthesia Type: General Level of consciousness: awake and alert Pain management: pain level controlled Vital Signs Assessment: post-procedure vital signs reviewed and stable Respiratory status: spontaneous breathing, nonlabored ventilation, respiratory function stable and patient connected to nasal cannula oxygen Cardiovascular status: blood pressure returned to baseline and stable Postop Assessment: no apparent nausea or vomiting Anesthetic complications: no    Last Vitals:  Vitals:   10/22/16 1408 10/22/16 1424  BP: (!) 173/84 (!) 170/72  Pulse: (!) 106 (!) 110  Resp: 17 (!) 21  Temp:    SpO2: 97% 98%    Last Pain:  Vitals:   10/22/16 1429  TempSrc:   PainSc: 6                  Ryan P Ellender

## 2016-10-23 ENCOUNTER — Encounter (HOSPITAL_COMMUNITY): Payer: Self-pay | Admitting: Vascular Surgery

## 2016-10-23 LAB — BASIC METABOLIC PANEL
Anion gap: 7 (ref 5–15)
BUN: 20 mg/dL (ref 6–20)
CHLORIDE: 110 mmol/L (ref 101–111)
CO2: 20 mmol/L — AB (ref 22–32)
CREATININE: 0.96 mg/dL (ref 0.44–1.00)
Calcium: 8.2 mg/dL — ABNORMAL LOW (ref 8.9–10.3)
GFR calc Af Amer: >60 mL/min (ref >60)
GFR calc non Af Amer: >60 mL/min (ref >60)
Glucose, Bld: 135 mg/dL — ABNORMAL HIGH (ref 65–99)
POTASSIUM: 5.1 mmol/L (ref 3.5–5.1)
Sodium: 137 mmol/L (ref 135–145)

## 2016-10-23 LAB — POCT I-STAT 4, (NA,K, GLUC, HGB,HCT)
GLUCOSE: 147 mg/dL — AB (ref 65–99)
HEMATOCRIT: 21 % — AB (ref 36.0–46.0)
HEMOGLOBIN: 7.1 g/dL — AB (ref 12.0–15.0)
Potassium: 5 mmol/L (ref 3.5–5.1)
Sodium: 139 mmol/L (ref 135–145)

## 2016-10-23 LAB — CULTURE, BLOOD (ROUTINE X 2)
Culture: NO GROWTH
Culture: NO GROWTH
SPECIAL REQUESTS: ADEQUATE
Special Requests: ADEQUATE

## 2016-10-23 LAB — CBC
HEMATOCRIT: 23.2 % — AB (ref 36.0–46.0)
HEMOGLOBIN: 7.8 g/dL — AB (ref 12.0–15.0)
MCH: 28.2 pg (ref 26.0–34.0)
MCHC: 33.6 g/dL (ref 30.0–36.0)
MCV: 83.8 fL (ref 78.0–100.0)
Platelets: 358 10*3/uL (ref 150–400)
RBC: 2.77 MIL/uL — AB (ref 3.87–5.11)
RDW: 13.4 % (ref 11.5–15.5)
WBC: 12.6 10*3/uL — ABNORMAL HIGH (ref 4.0–10.5)

## 2016-10-23 LAB — GLUCOSE, CAPILLARY
Glucose-Capillary: 124 mg/dL — ABNORMAL HIGH (ref 65–99)
Glucose-Capillary: 182 mg/dL — ABNORMAL HIGH (ref 65–99)

## 2016-10-23 NOTE — Progress Notes (Addendum)
   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 The toe amputation sites were inspected and so far look good.  No cardiac or pulmonary problems overnight.  Continue Maxipime and vancomycin.  Will need physical therapy consult. Will need Darko shoe - partial weightbearing right lower extremity, heel only.    SUBJECTIVE: Pain adequately controlled.  PHYSICAL EXAM:   Vitals:   10/22/16 1940 10/22/16 2358 10/23/16 0425 10/23/16 0457  BP: (!) 157/61 (!) 129/56 (!) 143/69   Pulse: 100 82 97   Resp: 14 17 19    Temp:  98 F (36.7 C) 98 F (36.7 C)   TempSrc:  Oral Oral   SpO2: 98% 95% 96%   Weight:    172 lb 9.9 oz (78.3 kg)  Height:       Brisk posterior tibial and anterior tibial signal with the Doppler. Her incisions look fine. Her toe amputation sites look fine.  LABS:   Lab Results  Component Value Date   WBC 12.6 (H) 10/23/2016   HGB 7.8 (L) 10/23/2016   HCT 23.2 (L) 10/23/2016   MCV 83.8 10/23/2016   PLT 358 10/23/2016   Lab Results  Component Value Date   CREATININE 0.96 10/23/2016   Lab Results  Component Value Date   INR 1.24 10/22/2016   CBG (last 3)   Recent Labs  10/22/16 1642 10/22/16 2006 10/23/16 0029  GLUCAP 211* 220* 182*    PROBLEM LIST:    Principal Problem:   PAD (peripheral artery disease) (HCC) Active Problems:   Acute respiratory failure with hypoxia (HCC)   Gangrene of left foot (HCC)   Sepsis (HCC)   CURRENT MEDS:   . aspirin  81 mg Oral Daily  . docusate sodium  100 mg Oral Daily  . heparin subcutaneous  5,000 Units Subcutaneous Q8H  . insulin aspart  0-20 Units Subcutaneous Q4H  . insulin detemir  10 Units Subcutaneous  QHS  . isosorbide mononitrate  90 mg Oral QHS  . metoprolol succinate  75 mg Oral Daily  . pantoprazole  40 mg Oral Daily  . pneumococcal 23 valent vaccine  0.5 mL Intramuscular Tomorrow-1000  . sodium chloride flush  3 mL Intravenous Q12H    Cari Caraway Beeper: [phone] Office: [phone] 10/23/2016

## 2016-10-23 NOTE — Progress Notes (Signed)
Pharmacy Antibiotic Note  [name] [name] is a 66 y.o. female admitted on 10/17/2016 with gangrene of right forefoot;  also concern for sepsis.  Pharmacy has been consulted for Vancomycin and Cefepime dosing.   POD #1 vascular procedures and amputation of right toes 1-3.  Day # 7 Vancomycin and #6 Cefepime. Blood cultures negative to date.  Tmax 99, WBC 12.6. Antibiotic doses remain appropriate for renal function.    Missed pm dose of Vanc on 9/25 (scheduled 3am/3pm) while in peri-operative areas.   Vanc dose given ~4am today   Plan:   Continue Vancomycin 750 mg IV q12hrs at 4am/4pm.   Continue Cefepime 2 gm IV q24hrs.   Will plan to check Vanc trough level over the weekend if Vanc to continue.   Target Vanc troughs 15-20 mcg/ml.   Follow renal function, final culture date, clincal progress and antibiotic plans.  Height: 5\' 4"  (162.6 cm) Weight: 172 lb 9.9 oz (78.3 kg) IBW/kg (Calculated) : 54.7  Temp (24hrs), Avg:98 F (36.7 C), Min:97.1 F (36.2 C), Max:99.5 F (37.5 C)   Recent Labs Lab 10/17/16 2230 10/18/16 0149 10/18/16 0355 10/19/16 0343 10/21/16 0215 10/22/16 0318 10/23/16 0426  WBC 21.1*  --  14.2* 18.1* 11.4* 10.6* 12.6*  CREATININE 1.16*  --  1.32* 1.20* 0.84 1.01* 0.96  LATICACIDVEN 1.4 0.7  --   --   --   --   --     Estimated Creatinine Clearance: 58.3 mL/min (by C-G formula based on SCr of 0.96 mg/dL).    Allergies  Allergen Reactions  . Doxycycline Other (See Comments)    lethargy  . Hydrochlorothiazide Other (See Comments)    lethargic   . Latex Rash  . Penicillins Swelling and Rash    Pt states she has tolerated Keflex in the past without problems. States she may have tolerated Augmentin in the past but it caused GI upset. Has patient had a PCN reaction causing immediate rash, facial/tongue/throat swelling, SOB or lightheadedness with hypotension: Yes Has patient had a PCN reaction causing severe rash involving mucus membranes or skin necrosis:  No Has patient had a PCN reaction that required hospitalization No Has patient had a PCN reaction occurring within the last 10 years: No    Antimicrobials this admission:  Vancomycin 9/20 >>  Cefepime 9/21 at 1am >>   Dose adjustments this admission:   Microbiology results:  9/20 blood x 2 - ng x 4 days so far  9/24 MRSA PCR negative  Thank you for allowing pharmacy to be a part of this patient's care.  Dennie Fetters, [place] Pager: [phone] 10/23/2016 12:24 PM

## 2016-10-23 NOTE — Evaluation (Signed)
Occupational Therapy Evaluation Patient Details Name: [name] [name] MRN: [id number] DOB: 1950-08-30 Today's Date: 10/23/2016    History of Present Illness [name] [name] is a pleasant 66 y.o. female,  Who had a toenail removed from her right great toe and subsequently developed wounds on the right first second and third toes. She was seen yesterday in our office by our nurse practitioner with gangrene of the forefoot and evidence of multilevel arterial occlusive disease. She was set up for an arteriogram today by Dr. Nanetta Batty. 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 She is s/p right common femoral artery endarterectomy and right femoropopliteal bypass graft with amputation of the right first second and third toes 10/22/16   Clinical Impression   PTA, pt was independent with assistive devices for basic ADL and functional mobility. OT evaluation limited this session due to pain, fatigue, and lack of Darco shoe at this time. Pt currently able to complete bed level grooming tasks with set-up, UB dressing tasks with min assist, and LB dressing tasks with mod assist. She presents with R LE pain, fatigue, and limited functional use of L UE due to previous injury impacting her ability to participate in ADL at [facility]. She would benefit from continued OT services while admitted to improve independence with ADL and functional mobility. Recommend SNF level rehabilitation post-acute D/C to maximize return to PLOF.    Follow Up Recommendations  SNF;Supervision/Assistance - 24 hour    Equipment Recommendations  Other (comment) (TBD at next venue of care)    Recommendations for Other Services       Precautions / Restrictions Precautions Precautions: None Restrictions Weight Bearing Restrictions: Yes RLE Weight Bearing: Partial weight bearing Other Position/Activity Restrictions: weight through heel with Darco shoe      Mobility Bed  Mobility Overal bed mobility: Needs Assistance Bed Mobility: Sit to Supine       Sit to supine: Mod assist   General bed mobility comments: Pt declined bed mobility this session as she had been up for 5 hours, was in significant pain, and had just returned to bed.    Transfers Overall transfer level: Needs assistance Equipment used: None Transfers: Squat Pivot Transfers     Squat pivot transfers: Mod assist     General transfer comment: modA for power up and steadying with transfer recliner to [facility] to bed    Balance Overall balance assessment: Needs assistance Sitting-balance support: No upper extremity supported;Feet unsupported Sitting balance-Leahy Scale: Fair     Standing balance support: Bilateral upper extremity supported Standing balance-Leahy Scale: Zero                             ADL either performed or assessed with clinical judgement   ADL Overall ADL's : Needs assistance/impaired Eating/Feeding: Set up;Bed level   Grooming: Set up;Bed level   Upper Body Bathing: Minimal assistance;Bed level   Lower Body Bathing: Moderate assistance;Bed level   Upper Body Dressing : Minimal assistance;Bed level   Lower Body Dressing: Moderate assistance;Bed level                 General ADL Comments: Limited evaluation to bed level due to pt with significant pain, fatigue, and no Darco shoe present.       Vision   Vision Assessment?: No apparent visual deficits     Perception     Praxis  Pertinent Vitals/Pain Pain Assessment: Faces Pain Score: 8  Faces Pain Scale: Hurts even more Pain Location: R foot, groin Pain Descriptors / Indicators: Aching;Burning Pain Intervention(s): Limited activity within patient's tolerance;Monitored during session;Repositioned     Hand Dominance     Extremity/Trunk Assessment Upper Extremity Assessment Upper Extremity Assessment: Generalized weakness;LUE deficits/detail LUE Deficits / Details:  Decreased strength at shoulder with 2/5 on MMT. Limited AROM L shoulder flexion to ~0-100 degrees due to previous break in 2009. Limited elbow AROM and lacking ~10 degrees of extension.    Lower Extremity Assessment Lower Extremity Assessment: Defer to PT evaluation RLE Deficits / Details: R 1/2/3 ray amputation, decreased ankle, and knee ROM 2' to pain, hip WFL RLE: Unable to fully assess due to pain LLE Deficits / Details: ROM WFL, strength grossly 3/5       Communication Communication Communication: No difficulties   Cognition Arousal/Alertness: Awake/alert Behavior During Therapy: WFL for tasks assessed/performed Overall Cognitive Status: Within Functional Limits for tasks assessed                                     General Comments  VSS    Exercises     Shoulder Instructions      Home Living Family/patient expects to be discharged to:: Private residence Living Arrangements: Children Available Help at Discharge: Family;Friend(s);Available 24 hours/day Type of Home: House Home Access: Ramped entrance     Home Layout: One level     Bathroom Shower/Tub: Tub/shower unit;Door   Bathroom Toilet: Standard Bathroom Accessibility: No   Home Equipment: Environmental consultant - 2 wheels;Cane - single point;Bedside commode;Tub bench          Prior Functioning/Environment Level of Independence: Independent with assistive device(s)        Comments: Limited household ambulation with RW, independent in ADLs, assist with iADLs        OT Problem List: Decreased strength;Decreased range of motion;Decreased activity tolerance;Impaired balance (sitting and/or standing);Decreased safety awareness;Decreased knowledge of use of DME or AE;Decreased knowledge of precautions;Pain;Impaired UE functional use      OT Treatment/Interventions: Self-care/ADL training;Therapeutic exercise;Energy conservation;DME and/or AE instruction;Therapeutic activities;Patient/family  education;Balance training    OT Goals(Current goals can be found in the care plan section) Acute Rehab OT Goals Patient Stated Goal: go home OT Goal Formulation: With patient Time For Goal Achievement: 11/06/16 Potential to Achieve Goals: Good ADL Goals Pt Will Perform Grooming: with min assist;standing Pt Will Perform Lower Body Dressing: with min assist;sit to/from stand Pt Will Transfer to Toilet: with min assist;ambulating;bedside commode Pt Will Perform Toileting - Clothing Manipulation and hygiene: with min assist;sit to/from stand  OT Frequency: Min 2X/week   Barriers to D/C:            Co-evaluation              AM-PAC PT "6 Clicks" Daily Activity     Outcome Measure Help from another person eating meals?: None Help from another person taking care of personal grooming?: None Help from another person toileting, which includes using toliet, bedpan, or urinal?: A Lot Help from another person bathing (including washing, rinsing, drying)?: A Lot Help from another person to put on and taking off regular upper body clothing?: None Help from another person to put on and taking off regular lower body clothing?: A Lot 6 Click Score: 18   End of Session    Activity Tolerance: Patient limited by  pain;Patient limited by fatigue Patient left: in bed;with call bell/phone within reach;with family/visitor present  OT Visit Diagnosis: Muscle weakness (generalized) (M62.81);Pain Pain - Right/Left: Right Pain - part of body: Leg                Time: 8638-1771 OT Time Calculation (min): 16 min Charges:  OT General Charges $OT Visit: 1 Visit OT Evaluation $OT Eval Moderate Complexity: 1 Mod G-Codes:     Doristine Section, MS OTR/L  Pager: [phone]   Indiyah Paone A Orella Cushman 10/23/2016, 6:17 PM

## 2016-10-23 NOTE — Evaluation (Signed)
Physical Therapy Evaluation Patient Details Name: [name] [name] MRN: [id number] DOB: 1950/03/18 Today's Date: 10/23/2016   History of Present Illness  [name] [name] is a pleasant 66 y.o. female,  Who had a toenail removed from her right great toe and subsequently developed wounds on the right first second and third toes. She was seen yesterday in our office by our nurse practitioner with gangrene of the forefoot and evidence of multilevel arterial occlusive disease. She was set up for an arteriogram today by Dr. Nanetta Batty. 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 She is s/p right common femoral artery endarterectomy and right femoropopliteal bypass graft with amputation of the right first second and third toes 10/22/16  Clinical Impression  Patient is s/p above surgery resulting in functional limitations due to the deficits listed below (see PT Problem List). Pt is limited by pain and generalized LE weakness. Pt currently modA for bed mobility and squat pivot transfers. Pt unable to attempt any ambulation until Darco shoe delivered to maintain PWB on RLE will assess ambulation at next session. Patient will benefit from skilled PT to increase their independence and safety with mobility to allow discharge to the venue listed below.       Follow Up Recommendations SNF    Equipment Recommendations  Other (comment) (to be determined at next venue)    Recommendations for Other Services       Precautions / Restrictions Precautions Precautions: None Restrictions Weight Bearing Restrictions: Yes RLE Weight Bearing: Partial weight bearing Other Position/Activity Restrictions: weight through heel with Darco shoe      Mobility  Bed Mobility Overal bed mobility: Needs Assistance Bed Mobility: Sit to Supine       Sit to supine: Mod assist   General bed mobility comments: modA for LE management against gravity into bed, pt able  to bring trunk to bed surface and square in bed  Transfers Overall transfer level: Needs assistance Equipment used: None Transfers: Squat Pivot Transfers     Squat pivot transfers: Mod assist     General transfer comment: modA for power up and steadying with transfer recliner to [facility] to bed  Ambulation/Gait             General Gait Details: not able to attempt Darco shoe not delivered yet        Balance Overall balance assessment: Needs assistance Sitting-balance support: No upper extremity supported;Feet unsupported Sitting balance-Leahy Scale: Fair     Standing balance support: Bilateral upper extremity supported Standing balance-Leahy Scale: Zero                               Pertinent Vitals/Pain Pain Assessment: 0-10 Pain Score: 8  Pain Location: R foot, groin Pain Descriptors / Indicators: Aching;Burning    Home Living Family/patient expects to be discharged to:: Private residence Living Arrangements: Children Available Help at Discharge: Family;Friend(s);Available 24 hours/day Type of Home: House Home Access: Ramped entrance     Home Layout: One level Home Equipment: Walker - 2 wheels;Cane - single point;Bedside commode;Tub bench      Prior Function Level of Independence: Independent with assistive device(s)         Comments: limited household ambulation with RW, independent in ADLs, assist with iADLs        Extremity/Trunk Assessment   Upper Extremity Assessment Upper Extremity Assessment: Defer to OT evaluation    Lower Extremity  Assessment Lower Extremity Assessment: RLE deficits/detail;LLE deficits/detail RLE Deficits / Details: R 1/2/3 ray amputation, decreased ankle, and knee ROM 2' to pain, hip WFL RLE: Unable to fully assess due to pain LLE Deficits / Details: ROM WFL, strength grossly 3/5       Communication   Communication: No difficulties  Cognition Arousal/Alertness: Awake/alert Behavior During Therapy: WFL  for tasks assessed/performed Overall Cognitive Status: Within Functional Limits for tasks assessed                                        General Comments General comments (skin integrity, edema, etc.): VSS        Assessment/Plan    PT Assessment Patient needs continued PT services  PT Problem List Decreased strength;Decreased range of motion;Decreased activity tolerance;Decreased balance;Decreased mobility;Cardiopulmonary status limiting activity;Pain       PT Treatment Interventions DME instruction;Gait training;Functional mobility training;Therapeutic activities;Therapeutic exercise;Balance training;Patient/family education    PT Goals (Current goals can be found in the Care Plan section)  Acute Rehab PT Goals Patient Stated Goal: go home PT Goal Formulation: With patient Time For Goal Achievement: 11/06/16 Potential to Achieve Goals: Fair    Frequency Min 3X/week    AM-PAC PT "6 Clicks" Daily Activity  Outcome Measure Difficulty turning over in bed (including adjusting bedclothes, sheets and blankets)?: A Lot Difficulty moving from lying on back to sitting on the side of the bed? : Unable Difficulty sitting down on and standing up from a chair with arms (e.g., wheelchair, bedside commode, etc,.)?: Unable Help needed moving to and from a bed to chair (including a wheelchair)?: A Lot Help needed walking in hospital room?: Total Help needed climbing 3-5 steps with a railing? : Total 6 Click Score: 8    End of Session Equipment Utilized During Treatment: Gait belt Activity Tolerance: Patient tolerated treatment well Patient left: in bed;with call bell/phone within reach Nurse Communication: Mobility status PT Visit Diagnosis: Unsteadiness on feet (R26.81);Other abnormalities of gait and mobility (R26.89);Muscle weakness (generalized) (M62.81);Difficulty in walking, not elsewhere classified (R26.2);Pain Pain - Right/Left: Right Pain - part of body: Ankle  and joints of foot    Time: 4098-1191 PT Time Calculation (min) (ACUTE ONLY): 36 min   Charges:   PT Evaluation $PT Eval Low Complexity: 1 Low     PT G Codes:        [name] [name] B. Beverely Risen PT, DPT Acute Rehabilitation  [phone] Pager [phone]    [name] [name] 10/23/2016, 3:10 PM

## 2016-10-24 LAB — GLUCOSE, CAPILLARY
GLUCOSE-CAPILLARY: 221 mg/dL — AB (ref 65–99)
GLUCOSE-CAPILLARY: 171 mg/dL — AB (ref 65–99)
Glucose-Capillary: 139 mg/dL — ABNORMAL HIGH (ref 65–99)

## 2016-10-24 MED ORDER — CLOPIDOGREL BISULFATE 75 MG PO TABS
75.0000 mg | ORAL_TABLET | Freq: Every day | ORAL | Status: DC
  Administered 2016-10-24 – 2016-10-28 (×5): 75 mg via ORAL
  Filled 2016-10-24 (×5): qty 1

## 2016-10-24 MED ORDER — BISACODYL 10 MG RE SUPP
10.0000 mg | Freq: Once | RECTAL | Status: AC
  Administered 2016-10-24: 10 mg via RECTAL
  Filled 2016-10-24: qty 1

## 2016-10-24 MED ORDER — INSULIN ASPART 100 UNIT/ML ~~LOC~~ SOLN
0.0000 [IU] | Freq: Three times a day (TID) | SUBCUTANEOUS | Status: DC
  Administered 2016-10-24: 3 [IU] via SUBCUTANEOUS
  Administered 2016-10-24: 7 [IU] via SUBCUTANEOUS
  Administered 2016-10-25: 3 [IU] via SUBCUTANEOUS
  Administered 2016-10-25 – 2016-10-26 (×3): 4 [IU] via SUBCUTANEOUS
  Administered 2016-10-26: 3 [IU] via SUBCUTANEOUS
  Administered 2016-10-26 – 2016-10-27 (×3): 4 [IU] via SUBCUTANEOUS
  Administered 2016-10-27: 3 [IU] via SUBCUTANEOUS
  Administered 2016-10-28: 4 [IU] via SUBCUTANEOUS

## 2016-10-24 MED ORDER — POLYETHYLENE GLYCOL 3350 17 G PO PACK
17.0000 g | PACK | Freq: Every day | ORAL | Status: DC | PRN
  Administered 2016-10-24: 17 g via ORAL
  Filled 2016-10-24: qty 1

## 2016-10-24 NOTE — Care Management Note (Signed)
Case Management Note Donn Pierini RN, BSN Unit 4E-Case Manager [phone]   Patient Details  Name: [name] [name] MRN: [id number] Date of Birth: 12-20-1950  Subjective/Objective:    Pt admitted with PAD s/p right femoral artery endarterectomy, fem-pop artery bypass graft, and ray amputation of right first, second, and third toes.                 Action/Plan: PTA pt lived at home- per PT/OT evals -recommendations for SNF- CSW has been consulted for possible SNF placement  Expected Discharge Date:                  Expected Discharge Plan:  Skilled Nursing Facility  In-House Referral:  Clinical Social Work  Discharge planning Services  CM Consult  Post Acute Care Choice:    Choice offered to:     DME Arranged:    DME Agency:     HH Arranged:    HH Agency:     Status of Service:  In process, will continue to follow  If discussed at Long Length of Stay Meetings, dates discussed:    Discharge Disposition:   Additional Comments:  Darrold Span, RN 10/24/2016, 11:09 AM

## 2016-10-24 NOTE — Progress Notes (Signed)
   VASCULAR SURGERY ASSESSMENT & PLAN:   2 Days Post-Op s/p:  Right common femoral artery endarterectomy with bovine paracardial patch angioplasty Right femoral to below-knee popliteal artery bypass with a vein graft Ray amputation of right first second and third toes  Bypass graft is patent with excellent posterior tibial signal with the Doppler.  No cardiac or pulmonary problems.  Continue Maxipime and vancomycin.  Will need physical therapy consult. Will need Darko shoe - partial weightbearing right lower extremity, heel only.   PTx recommends SNF  SUBJECTIVE:   Pain well controlled.   PHYSICAL EXAM:   Vitals:   10/23/16 2024 10/24/16 0017 10/24/16 0349 10/24/16 0749  BP: (!) 150/67 114/61 124/62 129/61  Pulse: 92   93  Resp: 15   19  Temp: 98.3 F (36.8 C) 98.4 F (36.9 C) 98.7 F (37.1 C) 98.1 F (36.7 C)  TempSrc: Oral Oral Oral Oral  SpO2: 95% 98% 96% 96%  Weight:   174 lb 6.1 oz (79.1 kg)   Height:       Incisions look fine. Right toe amps inspected and look fine. Brisk PT signal with the doppler.   LABS:   Lab Results  Component Value Date   WBC 12.6 (H) 10/23/2016   HGB 7.8 (L) 10/23/2016   HCT 23.2 (L) 10/23/2016   MCV 83.8 10/23/2016   PLT 358 10/23/2016   Lab Results  Component Value Date   CREATININE 0.96 10/23/2016   Lab Results  Component Value Date   INR 1.24 10/22/2016   CBG (last 3)   Recent Labs  10/22/16 2006 10/23/16 0029 10/23/16 1619  GLUCAP 220* 182* 124*    PROBLEM LIST:    Principal Problem:   PAD (peripheral artery disease) (HCC) Active Problems:   Acute respiratory failure with hypoxia (HCC)   Gangrene of left foot (HCC)   Sepsis (HCC)   CURRENT MEDS:   . aspirin  81 mg Oral Daily  . bisacodyl  10 mg Rectal Once  . docusate sodium  100 mg Oral Daily  . heparin subcutaneous  5,000 Units Subcutaneous Q8H  . insulin aspart  0-20 Units Subcutaneous TID WC  . insulin detemir  10 Units Subcutaneous QHS   . isosorbide mononitrate  90 mg Oral QHS  . metoprolol succinate  75 mg Oral Daily  . pantoprazole  40 mg Oral Daily  . sodium chloride flush  3 mL Intravenous Q12H    Cari Caraway Beeper: [phone] Office: [phone] 10/24/2016

## 2016-10-24 NOTE — Progress Notes (Signed)
Pt assisted up to [facility] with two assist.  She was extremely anxious and expressed inability to assist with transfer citing weakness of upper exremities as well as nonoperative leg.

## 2016-10-24 NOTE — Progress Notes (Signed)
Orthopedic Tech Progress Note Patient Details:  [name] [name] 1950/09/24 [id number]  Ortho Devices Type of Ortho Device: Darco shoe Ortho Device/Splint Interventions: Application   Saul Fordyce 10/24/2016, 4:38 PM

## 2016-10-24 NOTE — Clinical Social Work Note (Signed)
Clinical Social Work Assessment  Patient Details  Name: [name] [name] MRN: [id number] Date of Birth: Feb 22, 1950  Date of referral:  10/24/16               Reason for consult:  Discharge Planning, Facility Placement                Permission sought to share information with:  Family Supports Permission granted to share information::  Yes, Verbal Permission Granted  Name::     Quynh Basso  Agency::  snf  Relationship::  daughter  Contact Information:  [phone]  Housing/Transportation Living arrangements for the past 2 months:    Source of Information:  Patient Patient Interpreter Needed:  None Criminal Activity/Legal Involvement Pertinent to Current Situation/Hospitalization:  No - Comment as needed Significant Relationships:  Adult Children, Siblings Lives with:  Adult Children Do you feel safe going back to the place where you live?  Yes Need for family participation in patient care:  Yes (Comment)  Care giving concerns: Patient lives at home with adult daughter and stated daughter works during the day so family would be unable to give her 24/hr care  Social Worker assessment / plan: Clinical Social Worker met patient at bedside to offer support and discuss patients discharge needs. Patient stated she is agreeable to discharge to SNF and would prefer [facility] since she has been at that facility in the past. CSW reached out to admissions coordinator for [facility] to make them aware of patients interest. CSW will follow up with patients once bed offers are made.  Employment status:  Retired Forensic scientist:  Medicare PT Recommendations:  [place] / Referral to community resources:  [place]  Patient/Family's Response to care: Patient verbalized appreciation for CSW role in patients care   Patient/Family's Understanding of and Emotional Response to Diagnosis, Current Treatment, and Prognosis:  Patient has good understanding of  current hospitalization. Patient agreeable to discharge to SNF for short term rehab  Emotional Assessment Appearance:  Appears stated age Attitude/Demeanor/Rapport:  Other Affect (typically observed):  Appropriate, Calm, Pleasant Orientation:  Oriented to  Time, Oriented to Place, Oriented to Self, Oriented to Situation Alcohol / Substance use:  Not Applicable Psych involvement (Current and /or in the community):  No (Comment)  Discharge Needs  Concerns to be addressed:  No discharge needs identified Readmission within the last 30 days:  No Current discharge risk:  None Barriers to Discharge:  No Barriers Identified   Wende Neighbors, LCSW 10/24/2016, 1:50 PM

## 2016-10-25 LAB — BASIC METABOLIC PANEL
ANION GAP: 5 (ref 5–15)
BUN: 25 mg/dL — ABNORMAL HIGH (ref 6–20)
CALCIUM: 8.1 mg/dL — AB (ref 8.9–10.3)
CO2: 22 mmol/L (ref 22–32)
Chloride: 109 mmol/L (ref 101–111)
Creatinine, Ser: 1.09 mg/dL — ABNORMAL HIGH (ref 0.44–1.00)
GFR, EST AFRICAN AMERICAN: 60 mL/min — AB (ref >60)
GFR, EST NON AFRICAN AMERICAN: 52 mL/min — AB (ref >60)
Glucose, Bld: 160 mg/dL — ABNORMAL HIGH (ref 65–99)
Potassium: 4.7 mmol/L (ref 3.5–5.1)
SODIUM: 136 mmol/L (ref 135–145)

## 2016-10-25 LAB — CBC
HCT: 20.5 % — ABNORMAL LOW (ref 36.0–46.0)
HEMOGLOBIN: 6.7 g/dL — AB (ref 12.0–15.0)
MCH: 28.2 pg (ref 26.0–34.0)
MCHC: 32.7 g/dL (ref 30.0–36.0)
MCV: 86.1 fL (ref 78.0–100.0)
Platelets: 284 10*3/uL (ref 150–400)
RBC: 2.38 MIL/uL — AB (ref 3.87–5.11)
RDW: 14.4 % (ref 11.5–15.5)
WBC: 13.7 10*3/uL — ABNORMAL HIGH (ref 4.0–10.5)

## 2016-10-25 LAB — GLUCOSE, CAPILLARY
GLUCOSE-CAPILLARY: 165 mg/dL — AB (ref 65–99)
GLUCOSE-CAPILLARY: 143 mg/dL — AB (ref 65–99)
GLUCOSE-CAPILLARY: 198 mg/dL — AB (ref 65–99)
Glucose-Capillary: 159 mg/dL — ABNORMAL HIGH (ref 65–99)

## 2016-10-25 LAB — PREPARE RBC (CROSSMATCH)

## 2016-10-25 MED ORDER — SODIUM CHLORIDE 0.9 % IV SOLN
Freq: Once | INTRAVENOUS | Status: AC
  Administered 2016-10-25: 12:00:00 via INTRAVENOUS

## 2016-10-25 NOTE — Progress Notes (Signed)
   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  No cardiac or pulmonary problems.  Continue Maxipime and vancomycin For 1 week postop total. If she goes to skilled nursing facility, she could be converted to po antibiotics.  Acute blood loss anemia- transfuse 1 unit slowly over 2 hours given her history of congestive heart failure.   PTx recommends SNF  SUBJECTIVE:   No complaints  PHYSICAL EXAM:   Vitals:   10/24/16 1942 10/24/16 2344 10/25/16 0433 10/25/16 0749  BP: 125/63 128/62 135/62 (!) 144/60  Pulse: 95 92 100 92  Resp: 16 (!) 23 20 (!) 21  Temp: 99 F (37.2 C) 99 F (37.2 C) 99.2 F (37.3 C) 98.7 F (37.1 C)  TempSrc: Oral Oral Oral Oral  SpO2: 97% 97% 95% 95%  Weight:   174 lb 2.6 oz (79 kg)   Height:       Brisk posterior tibial signal on the right with the Doppler. Her incisions all look fine.  LABS:   Lab Results  Component Value Date   WBC 13.7 (H) 10/25/2016   HGB 6.7 (LL) 10/25/2016   HCT 20.5 (L) 10/25/2016   MCV 86.1 10/25/2016   PLT 284 10/25/2016   Lab Results  Component Value Date   CREATININE 1.09 (H) 10/25/2016   Lab Results  Component Value Date   INR 1.24 10/22/2016   CBG (last 3)   Recent Labs  10/24/16 1704 10/24/16 2108 10/25/16 0557  GLUCAP 221* 171* 165*    PROBLEM LIST:    Principal Problem:   PAD (peripheral artery disease) (HCC) Active Problems:   Acute respiratory failure with hypoxia (HCC)   Gangrene of left foot (HCC)   Sepsis (HCC)   CURRENT MEDS:   . aspirin  81 mg Oral Daily  . clopidogrel  75 mg Oral Daily  . docusate sodium  100 mg Oral Daily  . heparin subcutaneous  5,000 Units Subcutaneous  Q8H  . insulin aspart  0-20 Units Subcutaneous TID WC  . insulin detemir  10 Units Subcutaneous QHS  . isosorbide mononitrate  90 mg Oral QHS  . metoprolol succinate  75 mg Oral Daily  . pantoprazole  40 mg Oral Daily  . sodium chloride flush  3 mL Intravenous Q12H    Cari Caraway Beeper: [phone] Office: [phone] 10/25/2016

## 2016-10-25 NOTE — Progress Notes (Signed)
Vascular team notified of pt's a.m. Hgb of 6.7.  Received verbal order from Dr. Edilia Bo to administer 1 unit of PRBC over 2 hrs.  MD also advised not to hold plavix ordered for this morning.

## 2016-10-25 NOTE — Care Management Important Message (Signed)
Important Message  Patient Details  Name: RUCHAMA WILKOWSKI MRN: [id number] Date of Birth: October 15, 1950   Medicare Important Message Given:  Yes    Kyla Balzarine 10/25/2016, 2:34 PM

## 2016-10-25 NOTE — Progress Notes (Signed)
Physical Therapy Treatment Patient Details Name: [name] [name] MRN: [id number] DOB: Aug 17, 1950 Today's Date: 10/25/2016    History of Present Illness [name] [name] is a pleasant 66 y.o. female,  Who had a toenail removed from her right great toe and subsequently developed wounds on the right first second and third toes. She was seen yesterday in our office by our nurse practitioner with gangrene of the forefoot and evidence of multilevel arterial occlusive disease. She was set up for an arteriogram today by Dr. Nanetta Batty. 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 She is s/p right common femoral artery endarterectomy and right femoropopliteal bypass graft with amputation of the right first second and third toes 10/22/16    PT Comments    Patient required mod/max A +2 for functional transfers. Use of Stedy from [facility] to EOB. Pt is very anxious with mobility and tearful due to fear of falling. Darco shoe donned this session. Current plan remains appropriate.    Follow Up Recommendations  SNF     Equipment Recommendations  Other (comment) (to be determined at next venue)    Recommendations for Other Services       Precautions / Restrictions Precautions Precautions: None Restrictions Weight Bearing Restrictions: Yes RLE Weight Bearing: Partial weight bearing Other Position/Activity Restrictions: weight through heel with Darco shoe    Mobility  Bed Mobility Overal bed mobility: Needs Assistance Bed Mobility: Sit to Supine;Supine to Sit     Supine to sit: Mod assist;HOB elevated Sit to supine: Mod assist   General bed mobility comments: assist to scoot hips and elevate trunk and to bring bilat LE into bed; cues for sequencing and technique  Transfers Overall transfer level: Needs assistance Equipment used: 2 person hand held assist (face to face with gait belt) Transfers: Sit to/from Stand Sit to Stand: Max assist;+2  physical assistance;Mod assist Stand pivot transfers: Max assist;+2 physical assistance       General transfer comment: mod/max A +2 for sit to stands from EOB with and from [facility] with use of Stedy; pt was able to stand at [facility]; cues for hand placement and technique; assist at glutes to achieve upright posture  Ambulation/Gait             General Gait Details: pt unable    Stairs            Wheelchair Mobility    Modified Rankin (Stroke Patients Only)       Balance Overall balance assessment: Needs assistance Sitting-balance support: No upper extremity supported;Feet unsupported Sitting balance-Leahy Scale: Fair     Standing balance support: Bilateral upper extremity supported Standing balance-Leahy Scale: Zero                              Cognition Arousal/Alertness: Awake/alert Behavior During Therapy: Anxious Overall Cognitive Status: Within Functional Limits for tasks assessed                                 General Comments: very anxious about mobility      Exercises      General Comments General comments (skin integrity, edema, etc.): VSS on RA      Pertinent Vitals/Pain Pain Assessment: Faces Faces Pain Scale: Hurts little more Pain Location: R foot Pain Descriptors / Indicators: Aching;Sore;Throbbing Pain Intervention(s): Limited activity within patient's tolerance;Monitored  during session;Premedicated before session;Repositioned    Home Living                      Prior Function            PT Goals (current goals can now be found in the care plan section) Acute Rehab PT Goals Patient Stated Goal: go home PT Goal Formulation: With patient Time For Goal Achievement: 11/06/16 Potential to Achieve Goals: Fair Progress towards PT goals: Progressing toward goals    Frequency    Min 3X/week      PT Plan Current plan remains appropriate    Co-evaluation              AM-PAC PT "6  Clicks" Daily Activity  Outcome Measure  Difficulty turning over in bed (including adjusting bedclothes, sheets and blankets)?: A Lot Difficulty moving from lying on back to sitting on the side of the bed? : Unable Difficulty sitting down on and standing up from a chair with arms (e.g., wheelchair, bedside commode, etc,.)?: Unable Help needed moving to and from a bed to chair (including a wheelchair)?: Total Help needed walking in hospital room?: Total Help needed climbing 3-5 steps with a railing? : Total 6 Click Score: 7    End of Session Equipment Utilized During Treatment: Gait belt Activity Tolerance: Patient tolerated treatment well Patient left: in bed;with call bell/phone within reach;with family/visitor present Nurse Communication: Mobility status PT Visit Diagnosis: Unsteadiness on feet (R26.81);Other abnormalities of gait and mobility (R26.89);Muscle weakness (generalized) (M62.81);Difficulty in walking, not elsewhere classified (R26.2);Pain Pain - Right/Left: Right Pain - part of body: Ankle and joints of foot     Time: 1326-1406 PT Time Calculation (min) (ACUTE ONLY): 40 min  Charges:  $Therapeutic Activity: 38-52 mins                    G Codes:       Erline Levine, PTA Pager: [phone]     Carolynne Edouard 10/25/2016, 4:18 PM

## 2016-10-25 NOTE — Progress Notes (Signed)
CRITICAL VALUE ALERT  Critical Value:  Hgb 6.7  Date & Time Notied:  9/28 0350  Provider Notified: Imogene Burn, MD  Orders Received/Actions taken: n/a  Pt has no active bleed, asymptomatic. VSS. No new orders given.

## 2016-10-26 LAB — GLUCOSE, CAPILLARY
GLUCOSE-CAPILLARY: 131 mg/dL — AB (ref 65–99)
Glucose-Capillary: 143 mg/dL — ABNORMAL HIGH (ref 65–99)
Glucose-Capillary: 186 mg/dL — ABNORMAL HIGH (ref 65–99)
Glucose-Capillary: 129 mg/dL — ABNORMAL HIGH (ref 65–99)

## 2016-10-26 LAB — CBC
HCT: 26.7 % — ABNORMAL LOW (ref 36.0–46.0)
Hemoglobin: 8.8 g/dL — ABNORMAL LOW (ref 12.0–15.0)
MCH: 28.8 pg (ref 26.0–34.0)
MCHC: 33 g/dL (ref 30.0–36.0)
MCV: 87.3 fL (ref 78.0–100.0)
PLATELETS: 316 10*3/uL (ref 150–400)
RBC: 3.06 MIL/uL — ABNORMAL LOW (ref 3.87–5.11)
RDW: 14.2 % (ref 11.5–15.5)
WBC: 12.6 10*3/uL — ABNORMAL HIGH (ref 4.0–10.5)

## 2016-10-26 LAB — TYPE AND SCREEN
ABO/RH(D): A POS
Antibody Screen: NEGATIVE
UNIT DIVISION: 0
Unit division: 0

## 2016-10-26 LAB — BPAM RBC
BLOOD PRODUCT EXPIRATION DATE: 201810102359
Blood Product Expiration Date: 201810102359
ISSUE DATE / TIME: 201809281115
ISSUE DATE / TIME: 201809251157
UNIT TYPE AND RH: 6200
Unit Type and Rh: 6200

## 2016-10-26 LAB — VANCOMYCIN, TROUGH: VANCOMYCIN TR: 29 ug/mL — AB (ref 15–20)

## 2016-10-26 MED ORDER — DEXTROSE 5 % IV SOLN
2.0000 g | Freq: Two times a day (BID) | INTRAVENOUS | Status: DC
  Administered 2016-10-27 – 2016-10-28 (×4): 2 g via INTRAVENOUS
  Filled 2016-10-26 (×5): qty 2

## 2016-10-26 MED ORDER — VANCOMYCIN HCL IN DEXTROSE 1-5 GM/200ML-% IV SOLN
1000.0000 mg | INTRAVENOUS | Status: DC
  Administered 2016-10-26 – 2016-10-27 (×2): 1000 mg via INTRAVENOUS
  Filled 2016-10-26 (×3): qty 200

## 2016-10-26 NOTE — Progress Notes (Signed)
Pharmacy Antibiotic Note  [name] [name] is a 66 y.o. female admitted on 10/17/2016 with gangrene of right forefoot;  also concern for sepsis.  Pharmacy has been consulted for Vancomycin and Cefepime dosing.  Tr 29  Plan: Adjust vanc 1 g q24 h starting 2200  Height: 5\' 4"  (162.6 cm) Weight: 177 lb 1.6 oz (80.3 kg) IBW/kg (Calculated) : 54.7  Temp (24hrs), Avg:98.7 F (37.1 C), Min:98.3 F (36.8 C), Max:99.2 F (37.3 C)   Recent Labs Lab 10/21/16 0215 10/22/16 0318 10/23/16 0426 10/25/16 0309 10/26/16 0740 10/26/16 1540  WBC 11.4* 10.6* 12.6* 13.7* 12.6*  --   CREATININE 0.84 1.01* 0.96 1.09*  --   --   VANCOTROUGH  --   --   --   --   --  29*    Estimated Creatinine Clearance: 52 mL/min (A) (by C-G formula based on SCr of 1.09 mg/dL (H)).    Allergies  Allergen Reactions  . Doxycycline Other (See Comments)    lethargy  . Hydrochlorothiazide Other (See Comments)    lethargic   . Latex Rash  . Penicillins Swelling and Rash    Pt states she has tolerated Keflex in the past without problems. States she may have tolerated Augmentin in the past but it caused GI upset. Has patient had a PCN reaction causing immediate rash, facial/tongue/throat swelling, SOB or lightheadedness with hypotension: Yes Has patient had a PCN reaction causing severe rash involving mucus membranes or skin necrosis: No Has patient had a PCN reaction that required hospitalization No Has patient had a PCN reaction occurring within the last 10 years: No   Isaac Bliss, PharmD, BCPS, BCCCP Clinical Pharmacist Clinical phone for 10/26/2016 from 7a-3:30p: [phone] If after 3:30p, please call main pharmacy at: [phone] 10/26/2016 4:54 PM

## 2016-10-26 NOTE — Progress Notes (Signed)
Vancomycin trough is 29; pharmarcy notifed, vancomycin not administered

## 2016-10-26 NOTE — Progress Notes (Signed)
   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  Some wheezing this morning. She is to get a breathing treatment.  Continue Maxipime and vancomycin For 1 week postop total. If she goes to skilled nursing facility, she could be converted to po antibiotics.  Acute blood loss anemia- transfused 1 unit slowly yesterday. We'll get a follow up CBC today.  PTx recommends SNF   SUBJECTIVE:   No specific complaints. Was able to ambulate some with the Darco shoe.  PHYSICAL EXAM:   Vitals:   10/25/16 2000 10/25/16 2359 10/26/16 0339 10/26/16 0632  BP: (!) 147/56 (!) 157/68 (!) 157/66   Pulse: 79 89 92 (!) 101  Resp: 18 15 (!) 22 (!) 25  Temp: 98.6 F (37 C) 98.4 F (36.9 C) 98.3 F (36.8 C)   TempSrc: Oral Oral Oral   SpO2: 95% 93% 95% 92%  Weight:    177 lb 1.6 oz (80.3 kg)  Height:       Brisk posterior tibial signal and anterior tibial signal right leg with the Doppler. All of her incisions look fine. Lungs wheezing  LABS:   Lab Results  Component Value Date   WBC 13.7 (H) 10/25/2016   HGB 6.7 (LL) 10/25/2016   HCT 20.5 (L) 10/25/2016   MCV 86.1 10/25/2016   PLT 284 10/25/2016   Lab Results  Component Value Date   CREATININE 1.09 (H) 10/25/2016   Lab Results  Component Value Date   INR 1.24 10/22/2016   CBG (last 3)   Recent Labs  10/25/16 1653 10/25/16 2118 10/26/16 0608  GLUCAP 198* 159* 143*    PROBLEM LIST:    Principal Problem:   PAD (peripheral artery disease) (HCC) Active Problems:   Acute respiratory failure with hypoxia (HCC)   Gangrene of left foot (HCC)   Sepsis (HCC)   CURRENT MEDS:   . aspirin  81 mg Oral Daily  . clopidogrel   75 mg Oral Daily  . docusate sodium  100 mg Oral Daily  . heparin subcutaneous  5,000 Units Subcutaneous Q8H  . insulin aspart  0-20 Units Subcutaneous TID WC  . insulin detemir  10 Units Subcutaneous QHS  . isosorbide mononitrate  90 mg Oral QHS  . metoprolol succinate  75 mg Oral Daily  . pantoprazole  40 mg Oral Daily  . sodium chloride flush  3 mL Intravenous Q12H    Cari Caraway Beeper: [phone] Office: [phone] 10/26/2016

## 2016-10-27 LAB — GLUCOSE, CAPILLARY
GLUCOSE-CAPILLARY: 126 mg/dL — AB (ref 65–99)
GLUCOSE-CAPILLARY: 127 mg/dL — AB (ref 65–99)
Glucose-Capillary: 151 mg/dL — ABNORMAL HIGH (ref 65–99)
Glucose-Capillary: 159 mg/dL — ABNORMAL HIGH (ref 65–99)

## 2016-10-27 NOTE — Progress Notes (Addendum)
[facility]  Subjective  -  Doing well, moving much.   Objective (!) 151/73 87 98.4 F (36.9 C) (Oral) (!) 22 91%  Intake/Output Summary (Last 24 hours) at 10/27/16 0755 Last data filed at 10/27/16 0300  Gross per 24 hour  Intake              970 ml  Output              500 ml  Net              470 ml    Doppler signals PT/AT and peroneal Amputation site right foot viable, no active drainage this am.  Dry dressing re applied. 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 If she goes to skilled nursing facility, she could be converted to po antibiotics. Discharge planning for SNF Encourage mobility, Darco shoe for heel weight bearing  HGB 8.8 after 1 unit PRBC  [name], [name] [name] [name] 10/27/2016 7:55 AM --  Laboratory Lab Results:  Recent Labs  10/25/16 0309 10/26/16 0740  WBC 13.7* 12.6*  HGB 6.7* 8.8*  HCT 20.5* 26.7*  PLT 284 316     I have interviewed the patient and examined the patient. I agree with the findings by the PA. Good doppler flow right foot.  Toe amp sites look fine.  Soak right foot daily in luke warm dial soap soaks.  [name] Caraway, MD [phone]

## 2016-10-28 DIAGNOSIS — M6281 Muscle weakness (generalized): Secondary | ICD-10-CM | POA: Diagnosis not present

## 2016-10-28 DIAGNOSIS — R488 Other symbolic dysfunctions: Secondary | ICD-10-CM | POA: Diagnosis not present

## 2016-10-28 DIAGNOSIS — I110 Hypertensive heart disease with heart failure: Secondary | ICD-10-CM | POA: Diagnosis not present

## 2016-10-28 DIAGNOSIS — I5032 Chronic diastolic (congestive) heart failure: Secondary | ICD-10-CM | POA: Diagnosis not present

## 2016-10-28 DIAGNOSIS — I999 Unspecified disorder of circulatory system: Secondary | ICD-10-CM | POA: Diagnosis not present

## 2016-10-28 DIAGNOSIS — R2681 Unsteadiness on feet: Secondary | ICD-10-CM | POA: Diagnosis not present

## 2016-10-28 DIAGNOSIS — M79674 Pain in right toe(s): Secondary | ICD-10-CM | POA: Diagnosis present

## 2016-10-28 DIAGNOSIS — Z4781 Encounter for orthopedic aftercare following surgical amputation: Secondary | ICD-10-CM | POA: Diagnosis not present

## 2016-10-28 DIAGNOSIS — I96 Gangrene, not elsewhere classified: Secondary | ICD-10-CM | POA: Diagnosis not present

## 2016-10-28 DIAGNOSIS — I2510 Atherosclerotic heart disease of native coronary artery without angina pectoris: Secondary | ICD-10-CM | POA: Diagnosis not present

## 2016-10-28 DIAGNOSIS — S8990XA Unspecified injury of unspecified lower leg, initial encounter: Secondary | ICD-10-CM | POA: Diagnosis not present

## 2016-10-28 DIAGNOSIS — I10 Essential (primary) hypertension: Secondary | ICD-10-CM | POA: Diagnosis not present

## 2016-10-28 DIAGNOSIS — J9601 Acute respiratory failure with hypoxia: Secondary | ICD-10-CM | POA: Diagnosis not present

## 2016-10-28 DIAGNOSIS — R2689 Other abnormalities of gait and mobility: Secondary | ICD-10-CM | POA: Diagnosis not present

## 2016-10-28 DIAGNOSIS — E1142 Type 2 diabetes mellitus with diabetic polyneuropathy: Secondary | ICD-10-CM | POA: Diagnosis not present

## 2016-10-28 DIAGNOSIS — Z89421 Acquired absence of other right toe(s): Secondary | ICD-10-CM | POA: Diagnosis not present

## 2016-10-28 DIAGNOSIS — M79609 Pain in unspecified limb: Secondary | ICD-10-CM | POA: Diagnosis not present

## 2016-10-28 DIAGNOSIS — E785 Hyperlipidemia, unspecified: Secondary | ICD-10-CM | POA: Diagnosis not present

## 2016-10-28 DIAGNOSIS — Z23 Encounter for immunization: Secondary | ICD-10-CM | POA: Diagnosis not present

## 2016-10-28 DIAGNOSIS — I739 Peripheral vascular disease, unspecified: Secondary | ICD-10-CM | POA: Diagnosis not present

## 2016-10-28 DIAGNOSIS — Z95828 Presence of other vascular implants and grafts: Secondary | ICD-10-CM | POA: Diagnosis not present

## 2016-10-28 DIAGNOSIS — D62 Acute posthemorrhagic anemia: Secondary | ICD-10-CM | POA: Diagnosis not present

## 2016-10-28 DIAGNOSIS — I509 Heart failure, unspecified: Secondary | ICD-10-CM | POA: Diagnosis not present

## 2016-10-28 DIAGNOSIS — K219 Gastro-esophageal reflux disease without esophagitis: Secondary | ICD-10-CM | POA: Diagnosis not present

## 2016-10-28 LAB — GLUCOSE, CAPILLARY
GLUCOSE-CAPILLARY: 116 mg/dL — AB (ref 65–99)
Glucose-Capillary: 165 mg/dL — ABNORMAL HIGH (ref 65–99)

## 2016-10-28 MED ORDER — CIPROFLOXACIN HCL 500 MG PO TABS: 500.0000 mg | ORAL_TABLET | Freq: Two times a day (BID) | ORAL | 0 refills | Status: AC

## 2016-10-28 MED ORDER — OXYCODONE-ACETAMINOPHEN 5-325 MG PO TABS: 1.0000 | ORAL_TABLET | ORAL | 0 refills | Status: DC | PRN

## 2016-10-28 NOTE — Progress Notes (Addendum)
Clinical Social Worker facilitated patient discharge including contacting patient family and facility to confirm patient discharge plans.  Clinical information faxed to facility and family agreeable with plan.  CSW arranged ambulance transport via [facility] to [facility] .  RN to call [phone] (pt will go in rm# [phone]) for report prior to discharge.  Clinical Social Worker will sign off for now as social work intervention is no longer needed. Please consult [place] again if new need arises.  Marrianne Mood, MSW, [employer] [phone]

## 2016-10-28 NOTE — Progress Notes (Signed)
Physical Therapy Treatment Patient Details Name: [name] [name] MRN: [id number] DOB: 1950-10-25 Today's Date: 10/28/2016    History of Present Illness KIVA CHUGG is a pleasant 66 y.o. female,  Who had a toenail removed from her right great toe and subsequently developed wounds on the right first second and third toes. She was seen yesterday in our office by our nurse practitioner with gangrene of the forefoot and evidence of multilevel arterial occlusive disease. She was set up for an arteriogram today by Dr. Nanetta Batty. 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 She is s/p right common femoral artery endarterectomy and right femoropopliteal bypass graft with amputation of the right first second and third toes 10/22/16    PT Comments    Patient tolerated several sit to stand transfers with use of Stedy and min A +2. Current plan remains appropriate.    Follow Up Recommendations  SNF     Equipment Recommendations  Other (comment) (TBD next venue)    Recommendations for Other Services       Precautions / Restrictions Precautions Precautions: None Restrictions Weight Bearing Restrictions: Yes RLE Weight Bearing: Partial weight bearing Other Position/Activity Restrictions: weight through heel with Darco shoe    Mobility  Bed Mobility Overal bed mobility: Needs Assistance Bed Mobility: Supine to Sit     Supine to sit: Min assist     General bed mobility comments: assist to scoot hips to EOB with use of bed pad  Transfers Overall transfer level: Needs assistance   Transfers: Sit to/from Stand Sit to Stand: Min assist;+2 safety/equipment         General transfer comment: sit to stands X4; assist to power up into standing and to maintain upright posture; vc for technique, posture, and forward gaze; multimodal cues for trunk extension;   Ambulation/Gait                 Stairs             Wheelchair Mobility    Modified Rankin (Stroke Patients Only)       Balance Overall balance assessment: Needs assistance Sitting-balance support: No upper extremity supported;Feet unsupported Sitting balance-Leahy Scale: Fair     Standing balance support: Bilateral upper extremity supported Standing balance-Leahy Scale: Poor                              Cognition Arousal/Alertness: Awake/alert Behavior During Therapy: Anxious Overall Cognitive Status: Within Functional Limits for tasks assessed                                        Exercises      General Comments General comments (skin integrity, edema, etc.): VSS on RA      Pertinent Vitals/Pain Pain Assessment: Faces Faces Pain Scale: Hurts whole lot Pain Location: R foot Pain Descriptors / Indicators: Aching;Sore;Throbbing Pain Intervention(s): Limited activity within patient's tolerance;Monitored during session;Repositioned    Home Living                      Prior Function            PT Goals (current goals can now be found in the care plan section) Acute Rehab PT Goals Patient Stated Goal: go home Progress towards PT goals: Progressing toward goals  Frequency    Min 3X/week      PT Plan Current plan remains appropriate    Co-evaluation PT/OT/SLP Co-Evaluation/Treatment: Yes Reason for Co-Treatment: For patient/therapist safety;To address functional/ADL transfers PT goals addressed during session: Mobility/safety with mobility OT goals addressed during session: ADL's and self-care      AM-PAC PT "6 Clicks" Daily Activity  Outcome Measure  Difficulty turning over in bed (including adjusting bedclothes, sheets and blankets)?: A Lot Difficulty moving from lying on back to sitting on the side of the bed? : Unable Difficulty sitting down on and standing up from a chair with arms (e.g., wheelchair, bedside commode, etc,.)?: Unable Help needed moving to  and from a bed to chair (including a wheelchair)?: Total Help needed walking in hospital room?: Total Help needed climbing 3-5 steps with a railing? : Total 6 Click Score: 7    End of Session Equipment Utilized During Treatment: Gait belt Activity Tolerance: Patient tolerated treatment well Patient left: in chair;with call bell/phone within reach Nurse Communication: Mobility status (use Stedy for transfers) PT Visit Diagnosis: Unsteadiness on feet (R26.81);Other abnormalities of gait and mobility (R26.89);Muscle weakness (generalized) (M62.81);Difficulty in walking, not elsewhere classified (R26.2);Pain Pain - Right/Left: Right Pain - part of body: Ankle and joints of foot     Time: 1030-1100 PT Time Calculation (min) (ACUTE ONLY): 30 min  Charges:  $Therapeutic Activity: 8-22 mins                    G Codes:       Erline Levine, PTA Pager: [phone]     Carolynne Edouard 10/28/2016, 1:03 PM

## 2016-10-28 NOTE — [place] FL2 (Signed)
[place] MEDICAID FL2 LEVEL OF CARE SCREENING TOOL     IDENTIFICATION  Patient Name: [name] [name] Birthdate: 10-31-1950 Sex: female Admission Date (Current Location): 10/17/2016  [facility] and [place] Number:  Producer, television/film/video and Address:  [place], [address] [place], [place] [address]      Provider Number: [id number]  Attending Physician Name and Address:  Chuck Hint, *  Relative Name and Phone Number:  Anasha Perfecto, [phone]    Current Level of Care: Hospital Recommended Level of Care: Skilled Nursing Facility Prior Approval Number:    Date Approved/Denied:   PASRR Number: [id number]  Discharge Plan: SNF    Current Diagnoses: Patient Active Problem List   Diagnosis Date Noted  . Gangrene of left foot (HCC) 10/17/2016  . Sepsis (HCC) 10/17/2016  . Midfoot ulcer, left, limited to breakdown of skin (HCC) 01/08/2016  . Thigh hematoma 12/22/2015  . History of blood transfusion 12/22/2015  . Acute on chronic systolic CHF (congestive heart failure) (HCC) 12/22/2015  . H/O skin graft 12/11/2015  . NSTEMI (non-ST elevated myocardial infarction) (HCC) 11/19/2015  . DM type 2 with diabetic peripheral neuropathy (HCC) 11/19/2015  . Traumatic hemorrhagic shock (HCC)   . Chronic systolic CHF (congestive heart failure) (HCC)   . Uncontrolled type 2 diabetes mellitus with complication (HCC)   . Demand myocardial infarction (HCC) 11/08/2015  . Surgery, elective   . Central line infection, initial encounter   . Septic shock (HCC) 11/03/2015  . Encephalopathy acute 11/03/2015  . Acute blood loss anemia 11/03/2015  . Acute respiratory failure with hypoxia (HCC)   . CVA (cerebral vascular accident) (HCC)   . S/P femoral-popliteal bypass surgery   . Malnutrition of moderate degree 11/01/2015  . Diabetic ulcer of left midfoot associated with diabetes mellitus due to underlying condition, with muscle involvement without evidence of  necrosis (HCC)   . Hypertensive heart disease with chronic diastolic congestive heart failure (HCC) 10/31/2015  . Osteomyelitis of left foot (HCC) 10/30/2015  . PVD (peripheral vascular disease) with claudication (HCC) 09/28/2015  . Critical lower limb ischemia 08/28/2015  . Hyperlipidemia with target LDL less than 70 06/28/2013  . Diabetes mellitus type II, uncontrolled (HCC) 03/03/2013  . Complicated migraine 03/03/2013  . Stroke-like symptom 03/02/2013  . Diabetes mellitus (HCC) 03/02/2013  . Aphasia 03/02/2013  . Headache(784.0) 03/02/2013  . PAD (peripheral artery disease) (HCC) 06/05/2011  . Coronary artery disease involving native coronary artery of native heart without angina pectoris 06/05/2011  . Hypotension 06/05/2011  . Claudication in peripheral vascular disease:  Lifestyle limiting. 06/05/2011  . Hx of tobacco use, presenting hazards to health 06/05/2011    Orientation RESPIRATION BLADDER Height & Weight     Self, Time, Situation, Place  Normal Continent Weight: 178 lb 8 oz (81 kg) Height:   (162.6 cm)  BEHAVIORAL SYMPTOMS/MOOD NEUROLOGICAL BOWEL NUTRITION STATUS      Continent Diet (heart healthy/carb modified)  AMBULATORY STATUS COMMUNICATION OF NEEDS Skin   Extensive Assist (unable to ambulate per pt note) Verbally Surgical wounds                       Personal Care Assistance Level of Assistance  Bathing, Feeding, Dressing Bathing Assistance: Limited assistance Feeding assistance: Independent Dressing Assistance: Limited assistance     Functional Limitations Info  Sight, Hearing, Speech Sight Info: Adequate Hearing Info: Adequate Speech Info: Adequate    SPECIAL CARE FACTORS FREQUENCY  PT (  By licensed PT), OT (By licensed OT)     PT Frequency: 5x wk OT Frequency: 5x wk            Contractures Contractures Info: Not present    Additional Factors Info  Code Status, Allergies Code Status Info: Full Code Allergies Info: DOXYCYCLINE,  HYDROCHLOROTHIAZIDE, LATEX, PENICILLINS           Current Medications (10/28/2016):  This is the current hospital active medication list Current Facility-Administered Medications  Medication Dose Route Frequency Provider Last Rate Last Dose  . 0.9 %  sodium chloride infusion  250 mL Intravenous PRN Runell Gess, MD      . 0.9 %  sodium chloride infusion  500 mL Intravenous Once PRN Clinton Gallant M, PA-C      . 0.9 %  sodium chloride infusion   Intravenous Continuous Clinton Gallant M, PA-C   Stopped at 10/23/16 0600  . acetaminophen (TYLENOL) tablet 650 mg  650 mg Oral Q4H PRN Runell Gess, MD      . alum & mag hydroxide-simeth (MAALOX/MYLANTA) 200-200-20 MG/5ML suspension 15-30 mL  15-30 mL Oral Q2H PRN Clinton Gallant M, PA-C      . aspirin chewable tablet 81 mg  81 mg Oral Daily Runell Gess, MD   81 mg at 10/28/16 1111  . ceFEPIme (MAXIPIME) 2 g in dextrose 5 % 50 mL IVPB  2 g Intravenous Q12H Norva Pavlov, RPH 100 mL/hr at 10/28/16 1112 2 g at 10/28/16 1112  . clopidogrel (PLAVIX) tablet 75 mg  75 mg Oral Daily Chuck Hint, MD   75 mg at 10/28/16 1111  . docusate sodium (COLACE) capsule 100 mg  100 mg Oral Daily Clinton Gallant M, PA-C   100 mg at 10/28/16 1111  . guaiFENesin-dextromethorphan (ROBITUSSIN DM) 100-10 MG/5ML syrup 15 mL  15 mL Oral Q4H PRN Clinton Gallant M, PA-C      . heparin injection 5,000 Units  5,000 Units Subcutaneous Q8H Dara Lords, PA-C   5,000 Units at 10/28/16 0501  . hydrALAZINE (APRESOLINE) injection 5 mg  5 mg Intravenous Q20 Min PRN Clinton Gallant M, PA-C      . insulin aspart (novoLOG) injection 0-20 Units  0-20 Units Subcutaneous TID WC Rhyne, Samantha J, PA-C   4 Units at 10/27/16 1700  . insulin detemir (LEVEMIR) injection 10 Units  10 Units Subcutaneous QHS Minor, Vilinda Blanks, NP   10 Units at 10/27/16 2123  . ipratropium (ATROVENT) nebulizer solution 0.5 mg  0.5 mg Nebulization Q6H PRN Maeola Harman, MD   0.5 mg  at 10/17/16 2208  . isosorbide mononitrate (IMDUR) 24 hr tablet 90 mg  90 mg Oral QHS Maeola Harman, MD   90 mg at 10/27/16 2123  . labetalol (NORMODYNE,TRANDATE) injection 10 mg  10 mg Intravenous Q10 min PRN Lars Mage, PA-C   10 mg at 10/27/16 2124  . levalbuterol (XOPENEX) nebulizer solution 0.63 mg  0.63 mg Nebulization Q6H PRN Maeola Harman, MD   0.63 mg at 10/26/16 0647  . magnesium sulfate IVPB 2 g 50 mL  2 g Intravenous Daily PRN Clinton Gallant M, PA-C      . metoprolol succinate (TOPROL-XL) 24 hr tablet 75 mg  75 mg Oral Daily Berton Bon, NP   75 mg at 10/28/16 1110  . metoprolol tartrate (LOPRESSOR) injection 2-5 mg  2-5 mg Intravenous Q2H PRN Clinton Gallant M, PA-C      . morphine 2 MG/ML  injection 2 mg  2 mg Intravenous Q1H PRN Hammons, Kimberly B, RPH   2 mg at 10/24/16 1646  . ondansetron (ZOFRAN) injection 4 mg  4 mg Intravenous Q6H PRN Runell Gess, MD   4 mg at 10/27/16 0700  . oxyCODONE-acetaminophen (PERCOCET/ROXICET) 5-325 MG per tablet 1-2 tablet  1-2 tablet Oral Q4H PRN Chuck Hint, MD   2 tablet at 10/28/16 1129  . pantoprazole (PROTONIX) EC tablet 40 mg  40 mg Oral Daily Clinton Gallant M, PA-C   40 mg at 10/28/16 1111  . phenol (CHLORASEPTIC) mouth spray 1 spray  1 spray Mouth/Throat PRN Clinton Gallant M, PA-C      . polyethylene glycol (MIRALAX / GLYCOLAX) packet 17 g  17 g Oral Daily PRN Rhyne, Samantha J, PA-C   17 g at 10/24/16 1145  . potassium chloride SA (K-DUR,KLOR-CON) CR tablet 20-40 mEq  20-40 mEq Oral Daily PRN Clinton Gallant M, PA-C      . senna (SENOKOT) tablet 8.6 mg  1 tablet Oral BID PRN Lars Mage, PA-C   8.6 mg at 10/21/16 1325  . sodium chloride flush (NS) 0.9 % injection 3 mL  3 mL Intravenous Q12H Runell Gess, MD   3 mL at 10/27/16 2137  . sodium chloride flush (NS) 0.9 % injection 3 mL  3 mL Intravenous PRN Runell Gess, MD   3 mL at 10/18/16 0128  . vancomycin (VANCOCIN) IVPB 1000 mg/200 mL  premix  1,000 mg Intravenous Q24H Chuck Hint, MD   Stopped at 10/27/16 2223     Discharge Medications: Please see discharge summary for a list of discharge medications.  Relevant Imaging Results:  Relevant Lab Results:   Additional Information SS#[id number]  Althea Charon, LCSW

## 2016-10-28 NOTE — Progress Notes (Signed)
Occupational Therapy Treatment Patient Details Name: [name] [name] MRN: [id number] DOB: [birth year] Today's Date: 10/28/2016    History of present illness [name] [name] is a pleasant 66 y.o. female,  Who had a toenail removed from her right great toe and subsequently developed wounds on the right first second and third toes. She was seen yesterday in our office by our nurse practitioner with gangrene of the forefoot and evidence of multilevel arterial occlusive disease. She was set up for an arteriogram today by Dr. Nanetta Batty. 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 She remains highly anxious concerning all aspects of mobility. Pt benefited from playing nature sounds during session for calming. She was able to complete simulated toilet transfer with Stedy and min assist +2 for safety. She was able to complete LB dressing tasks with mod assist this session. D/C recommendation remains appropriate.   Follow Up Recommendations  SNF;Supervision/Assistance - 24 hour    Equipment Recommendations  Other (comment) (TBD)    Recommendations for Other Services      Precautions / Restrictions Precautions Precautions: None Restrictions Weight Bearing Restrictions: Yes RLE Weight Bearing: Partial weight bearing Other Position/Activity Restrictions: weight through heel with Darco shoe       Mobility Bed Mobility Overal bed mobility: Needs Assistance Bed Mobility: Sit to Supine     Supine to sit: Min assist     General bed mobility comments: Min assist to scoot to EOB.  Transfers Overall transfer level: Needs assistance   Transfers: Sit to/from Stand Sit to Stand: Min assist;+2 safety/equipment         General transfer  comment: min assist for lifting. [place] person assist for LLE placement    Balance Overall balance assessment: Needs assistance Sitting-balance support: No upper extremity supported;Feet unsupported Sitting balance-Leahy Scale: Fair     Standing balance support: Bilateral upper extremity supported Standing balance-Leahy Scale: Poor                             ADL either performed or assessed with clinical judgement   ADL Overall ADL's : Needs assistance/impaired     Grooming: Set up;Sitting Grooming Details (indicate cue type and reason): brushed hair sitting on Stedy             Lower Body Dressing: Moderate assistance;Bed level Lower Body Dressing Details (indicate cue type and reason): Pt able to don L sock but unable to don R Darco shoe Toilet Transfer: Minimal assistance;+2 for safety/equipment Toilet Transfer Details (indicate cue type and reason): with Stedy           General ADL Comments: Pt able to complete mulitiple sit<>stand transfers in [place]. Limited by UE weakness.     Vision   Vision Assessment?: No apparent visual deficits Additional Comments: Has glasses for reading.   Perception     Praxis      Cognition Arousal/Alertness: Awake/alert Behavior During Therapy: Anxious Overall Cognitive Status: Within Functional Limits for tasks assessed                                          Exercises  Shoulder Instructions       General Comments VSS on RA    Pertinent Vitals/ Pain       Pain Assessment: Faces Faces Pain Scale: Hurts whole lot Pain Location: R foot Pain Descriptors / Indicators: Aching;Sore;Throbbing Pain Intervention(s): Limited activity within patient's tolerance;Monitored during session;Repositioned  Home Living                                          Prior Functioning/Environment              Frequency  Min 2X/week        Progress Toward Goals  OT  Goals(current goals can now be found in the care plan section)  Progress towards OT goals: Progressing toward goals  Acute Rehab OT Goals Patient Stated Goal: go home OT Goal Formulation: With patient Time For Goal Achievement: 11/06/16 Potential to Achieve Goals: Good  Plan Discharge plan remains appropriate    Co-evaluation    PT/OT/SLP Co-Evaluation/Treatment: Yes Reason for Co-Treatment: For patient/therapist safety;To address functional/ADL transfers   OT goals addressed during session: ADL's and self-care      AM-PAC PT "6 Clicks" Daily Activity     Outcome Measure   Help from another person eating meals?: None Help from another person taking care of personal grooming?: None Help from another person toileting, which includes using toliet, bedpan, or urinal?: A Lot Help from another person bathing (including washing, rinsing, drying)?: A Lot Help from another person to put on and taking off regular upper body clothing?: None Help from another person to put on and taking off regular lower body clothing?: A Lot 6 Click Score: 18    End of Session Equipment Utilized During Treatment: Gait belt Antony Salmon)  OT Visit Diagnosis: Muscle weakness (generalized) (M62.81);Pain Pain - Right/Left: Right Pain - part of body: Leg   Activity Tolerance Patient limited by pain;Patient limited by fatigue   Patient Left in bed;with call bell/phone within reach;with family/visitor present   Nurse Communication          Time: 1638-4536 OT Time Calculation (min): 28 min  Charges: OT General Charges $OT Visit: 1 Visit OT Treatments $Self Care/Home Management : 8-22 mins  Doristine Section, MS OTR/L  Pager: [phone]    Curvin Hunger A Olivine Hiers 10/28/2016, 12:40 PM

## 2016-10-28 NOTE — Care Management Important Message (Signed)
Important Message  Patient Details  Name: [name] [name] MRN: [id number] Date of Birth: 31-Aug-1950   Medicare Important Message Given:  Yes    Delphina Schum Abena 10/28/2016, 9:24 AM

## 2016-10-28 NOTE — Discharge Summary (Signed)
[facility] Discharge Summary   Patient ID:  [name] [name] MRN: [id number] DOB/AGE: Nov 20, 1950 66 y.o.  Admit date: 10/17/2016 Discharge date: 10/1/2018Date of Surgery: 10/17/2016 - 10/22/2016 Surgeon: Moishe Spice): Chuck Hint, MD  Admission Diagnosis: PAD (peripheral artery disease) [facility]) [I73.9]  Discharge Diagnoses:  PAD (peripheral artery disease) (HCC) [I73.9]  Secondary Diagnoses: Past Medical History:  Diagnosis Date  . Anemia 10/2015   Acute Blood Loss  . Arthritis    "feel like I have it all over" (08/28/2015)  . CHF (congestive heart failure) (HCC)   . Complication of anesthesia    DIFFICULT WAKING "only when I was smoking; no problems since I quit"  . Coronary artery disease   . Family history of adverse reaction to anesthesia    sister slow to wake up  . GERD (gastroesophageal reflux disease)    takes Protonix daily   . Hip bursitis   . History of blood transfusion    10/2015  . Hyperlipidemia LDL goal < 70 06/28/2013   takes Atorvastatin daily  . Hypertension    takes Metoprolol and Imdur daily  . Malnutrition (HCC)   . Migraine    "none in a long time" (08/28/2015)  . Myocardial infarction (HCC) 2011  . Neuromuscular disorder (HCC)    DIABETIC NEUROPATHY  . Osteomyelitis (HCC) 2017   Left foot  . PAD (peripheral artery disease) (HCC)   . Peripheral vascular disease (HCC)   . Respiratory failure (HCC) 10/2015   Acute Hypoxia- acute pulmonary edema 11/13/2015  . Septic shock (HCC) 10/2015  . Stroke (HCC)   . 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  HPI: [name] [name] is a pleasant 66 y.o. female,  Who had a toenail removed from her right great toe and subsequently developed wounds on the right first second and third toes. She was seen yesterday in our office by our nurse practitioner with gangrene of the forefoot and evidence of multilevel arterial occlusive disease. She was set up for an arteriogram today by Dr. Nanetta Batty. 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 She is admitted for right common femoral artery endarterectomy and right femoral to below knee popliteal artery bypass.  I originally saw the patient in consultation on 08/28/2015 with a nonhealing wound of the left lower extremity. On 09/05/2015, she underwent a left common femoral artery endarterectomy with profundoplasty and vein patch angioplasty with left great saphenous vein. At the time of surgery because of diffuse disease in the external iliac artery I was unable to get adequate inflow and therefore did not proceed with femoropopliteal bypass grafting on the left. She subsequently had angioplasty and stenting of the external iliac artery by Dr. Nanetta Batty. The wounds on the left foot ultimately healed.  She has a long history of bilateral lower extremity claudication.  The patient had a myocardial infarction in 2006. She had PTCA back at that time. She denies any recent chest pain or chest pressure.  Pre-op problems: Surgery canceled today because of respiratory issues last night. I have rescheduled her surgery for Tuesday if she is medically ready.  Appreciate help from cardiology and critical care medicine.  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 Tentatively plan right common femoral artery endarterectomy and right femoropopliteal bypass graft with amputation of the right first  second and third toes, possible transmetatarsal amputation, on Tuesday next week.  10/18/2016: Hypoxic respiratory distress     Hospital Course:  ALMETER WESTHOFF is a 66 y.o. female is S/P Right Procedure(s): ENDARTERECTOMY RIGHT COMMON FEMORAL BYPASS GRAFT RIGHT FEMORAL- BELOW KNEE POPLITEAL ARTERY WITH VEIN AMPUTATION DIGIT RIGHT TOES 1-3 POSSIBLE TRANSMETATARSAL INTRA OPERATIVE ARTERIOGRAM THROMBECTOMY FEMORAL ARTERY  Consults:  Cardiology Nahser, Deloris Ping, MD CCM Nelda Bucks, MD  Post op Bypass graft is patent with excellent posterior tibial signal with the Doppler. The toe amputation sites were inspected and so far look good.  No cardiac or pulmonary problems overnight.  Continue Maxipime and vancomycin.  Will need physical therapy consult. Will need Darko shoe - partial weightbearing right lower extremity, heel only  POD# 3 Continue Maxipime and vancomycin For 1 week postop total. If she goes to skilled nursing facility, she could be converted to po antibiotics.  Acute blood loss anemia- transfuse 1 unit slowly over 2 hours given her history of congestive heart failure.   6 Days Post-Op s/p:  Right common femoral artery endarterectomy with bovine paracardial patch angioplasty Right femoral to below-knee popliteal artery bypass with a vein graft Ray amputation of right first second and third toes  Bypass graft is patent with excellent posterior tibial signal with the Doppler.  Day 6/7  Maxipime and vancomycin. When discharged, will put on po Cipro for 2 weeks.  Continue to soak foot daily and lukewarm dial soap soaks.  Acute blood loss anemia- Hgb improved appropriately after 1 U PRBC's Sat  For  SNF  Significant Diagnostic Studies: CBC Lab Results  Component Value Date   WBC 12.6 (H) 10/26/2016   HGB 8.8 (L) 10/26/2016   HCT 26.7 (L) 10/26/2016   MCV 87.3 10/26/2016   PLT 316 10/26/2016    BMET    Component Value Date/Time   NA 136  10/25/2016 0309   NA 142 12/22/2015   K 4.7 10/25/2016 0309   CL 109 10/25/2016 0309   CO2 22 10/25/2016 0309   GLUCOSE 160 (H) 10/25/2016 0309   BUN 25 (H) 10/25/2016 0309   BUN 9 12/22/2015   CREATININE 1.09 (H) 10/25/2016 0309   CREATININE 0.73 01/02/2016 1555   CALCIUM 8.1 (L) 10/25/2016 0309   GFRNONAA 52 (L) 10/25/2016 0309   GFRAA 60 (L) 10/25/2016 0309   COAG Lab Results  Component Value Date   INR 1.24 10/22/2016   INR 1.23 10/17/2016   INR 1.15 12/01/2015     Disposition:  Discharge to :Skilled nursing facility Discharge Instructions    AMB Referral to Cardiac Rehabilitation - Phase II    Complete by:  As directed    Diagnosis:  Coronary Stents   Call MD for:  redness, tenderness, or signs of infection (pain, swelling, bleeding, redness, odor or green/yellow discharge around incision site)    Complete by:  As directed    Call MD for:  severe or increased pain, loss or decreased feeling  in affected limb(s)    Complete by:  As directed    Call MD for:  temperature >100.5    Complete by:  As directed    Discharge instructions    Complete by:  As directed  May shower daily heel weight bearing in Darco( black shoe), dial soap foot soaks daily right foot, dry well after soaks.   Increase activity slowly    Complete by:  As directed    Walk with assistance use walker or cane as needed   Resume previous diet    Complete by:  As directed      Allergies as of 10/28/2016      Reactions   Doxycycline Other (See Comments)   lethargy   Hydrochlorothiazide Other (See Comments)   lethargic    Latex Rash   Penicillins Swelling, Rash   Pt states she has tolerated Keflex in the past without problems. States she may have tolerated Augmentin in the past but it caused GI upset. Has patient had a PCN reaction causing immediate rash, facial/tongue/throat swelling, SOB or lightheadedness with hypotension: Yes Has patient had a PCN reaction causing severe rash involving mucus  membranes or skin necrosis: No Has patient had a PCN reaction that required hospitalization No Has patient had a PCN reaction occurring within the last 10 years: No      Medication List    TAKE these medications   acetaminophen 500 MG tablet Commonly known as:  TYLENOL Take 1,000 mg by mouth every 6 (six) hours as needed for mild pain.   amLODipine 5 MG tablet Commonly known as:  NORVASC TAKE 1 TABLET(5 MG) BY MOUTH DAILY   aspirin EC 81 MG tablet Take 81 mg by mouth daily.   atorvastatin 80 MG tablet Commonly known as:  LIPITOR TAKE 1 TABLET(80 MG) BY MOUTH DAILY   BD PEN NEEDLE NANO U/F 32G X 4 MM Misc Generic drug:  Insulin Pen Needle U UTD D   ciprofloxacin 500 MG tablet Commonly known as:  CIPRO Take 1 tablet (500 mg total) by mouth 2 (two) times daily.   clopidogrel 75 MG tablet Commonly known as:  PLAVIX TAKE 1 TABLET(75 MG TOTAL) BY MOUTH DAILY. HOLD FOR VASCULAR SURGERY, RESTART AS SOON AS POSSIBLE AFTER LEG SURGERY(AFTER 8/8)   furosemide 40 MG tablet Commonly known as:  LASIX Take 40 mg by mouth daily.   gabapentin 300 MG capsule Commonly known as:  NEURONTIN Take 300 mg by mouth 3 (three) times daily.   isosorbide mononitrate 60 MG 24 hr tablet Commonly known as:  IMDUR 60 mg. Take 1 tablet by mouth along with 30 mg to equal 90 mg daily   isosorbide mononitrate 30 MG 24 hr tablet Commonly known as:  IMDUR 30 mg. Take 1 tablet along by mouth with 60 mg to equal 90 mg daily.   LEVEMIR FLEXTOUCH 100 UNIT/ML Pen Generic drug:  Insulin Detemir INJECT 30 UNITS INTO THE SKIN HS UTD. DISCONTINUE LANTUS   losartan 100 MG tablet Commonly known as:  COZAAR TAKE 1 TABLET BY MOUTH EVERY DAY What changed:  See the new instructions.   metFORMIN 1000 MG tablet Commonly known as:  GLUCOPHAGE Take 1,000 mg by mouth 2 (two) times daily with a meal.   metoprolol succinate 50 MG 24 hr tablet Commonly known as:  TOPROL-XL TAKE 1 AND 1/2 TABLET BY MOUTH  DAILY What changed:  how much to take  how to take this  when to take this  additional instructions   ONE TOUCH ULTRA TEST test strip Generic drug:  glucose blood USE TO TEST EVERY DAY AS DIRECTED   oxyCODONE-acetaminophen 5-325 MG tablet Commonly known as:  PERCOCET/ROXICET Take 1-2 tablets by mouth every 4 (four) hours as  needed for moderate pain.   pantoprazole 40 MG tablet Commonly known as:  PROTONIX TAKE 1 TABLET BY MOUTH DAILY What changed:  See the new instructions.   potassium chloride 10 MEQ tablet Commonly known as:  K-DUR,KLOR-CON Take 10 mEq by mouth daily.   sulfamethoxazole-trimethoprim 400-80 MG tablet Commonly known as:  BACTRIM,SEPTRA Take 1 tablet by mouth 2 (two) times daily.      Verbal and written Discharge instructions given to the patient. Wound care per Discharge AVS  Contact information for follow-up providers    Chuck Hint, MD Follow up in 2 week(s).   Specialties:  Vascular Surgery, Cardiology Contact information: [address] [place] [address] [phone]            Contact information for after-discharge care    Destination    [facility] Follow up.   Specialty:  Skilled Nursing Facility Contact information: [address] [place] [address] [phone]                  Signed: Clinton Gallant [facility] 10/28/2016, 1:24 PM - For [facility] use --- Instructions: Press F2 to tab through selections.  Delete question if not applicable.   Post-op:  Wound infection: No Pre-op infection Graft infection: No  Transfusion: Yes  If yes, 2 units given New Arrhythmia: No Ipsilateral amputation: [x ] no, [ ]  Minor, [ ]  BKA, [ ]  AKA Discharge patency: [x ] Primary, [ ]  Primary assisted, [ ]  Secondary, [ ]  Occluded Patency judged by: [x ] Dopper only, [ ]  Palpable graft pulse, [ ]  Palpable distal pulse, [ ]  ABI inc. > 0.15, [ ]  Duplex D/C Ambulatory Status: Ambulatory with  Assistance  Complications: MI: [x ] No, [ ]  Troponin only, [ ]  EKG or Clinical CHF: Yes Resp failure: [ ]  none, [ ]  Pneumonia, [ ]  Ventilator [x]  pre- op respiratory distress Chg in renal function: [x ] none, [ ]  Inc. Cr > 0.5, [ ]  Temp. Dialysis, [ ]  Permanent dialysis Stroke: [ x] None, [ ]  Minor, [ ]  Major Return to OR: No  Reason for return to OR: [ ]  Bleeding, [ ]  Infection, [ ]  Thrombosis, [ ]  Revision  Discharge medications: Statin use:  Yes ASA use:  Yes Plavix use:  Yes Beta blocker use: Yes Coumadin use: No  for medical reason

## 2016-10-28 NOTE — Care Management Note (Signed)
Case Management Note Donn Pierini RN, BSN Unit 4E-Case Manager [phone]   Patient Details  Name: [name] [name] MRN: [id number] Date of Birth: [birth year]  Subjective/Objective:    Pt admitted with PAD s/p right femoral artery endarterectomy, fem-pop artery bypass graft, and ray amputation of right first, second, and third toes.                 Action/Plan: PTA pt lived at home- per PT/OT evals -recommendations for SNF- CSW has been consulted for possible SNF placement  Expected Discharge Date:  10/28/16               Expected Discharge Plan:  Skilled Nursing Facility  In-House Referral:  Clinical Social Work  Discharge planning Services  CM Consult  Post Acute Care Choice:  NA Choice offered to:  NA  DME Arranged:    DME Agency:     HH Arranged:    HH Agency:     Status of Service:  Completed, signed off  If discussed at [employer], dates discussed:    Discharge Disposition: skilled facility   Additional Comments:  10/28/16- 1400- [name] RN, CM- pt for d/c to SNF today- CSW following for placement needs.  Darrold Span, RN 10/28/2016, 2:04 PM

## 2016-10-28 NOTE — Progress Notes (Signed)
Met patient and her daughter. Seems to doing well. Is a little concerned about how well she will do after having some toes amputated.  Offered prayer and patient was happy to have prayer.Conard Novak, Chaplain    10/28/16 1400  Clinical Encounter Type  Visited With Patient and family together  Visit Type Initial;Spiritual support  Referral From Chaplain  Spiritual Encounters  Spiritual Needs Prayer;Emotional  Stress Factors  Patient Stress Factors (concerned about adjustment after losing some toes)

## 2016-10-28 NOTE — Progress Notes (Signed)
   VASCULAR SURGERY ASSESSMENT & PLAN:   6 Days Post-Op s/p:  Right common femoral artery endarterectomy with bovine paracardial patch angioplasty Right femoral to below-knee popliteal artery bypass with a vein graft Ray amputation of right first second and third toes  Bypass graft is patent with excellent posterior tibial signal with the Doppler.  Day 6/7  Maxipime and vancomycin. When discharged, will put on po Cipro for 2 weeks.  Continue to soak foot daily and lukewarm dial soap soaks.  Acute blood loss anemia- Hgb improved appropriately after 1 U PRBC's Sat  For  SNF   SUBJECTIVE:   No specific complaints.  PHYSICAL EXAM:   Vitals:   10/27/16 1600 10/27/16 2005 10/28/16 0022 10/28/16 0430  BP: (!) 171/71 (!) 166/71 (!) 167/70   Pulse:  86 89 93  Resp: (!) 23 17 (!) 23   Temp:  98.4 F (36.9 C) 98 F (36.7 C) 98.4 F (36.9 C)  TempSrc:  Axillary Oral Oral  SpO2:  98% 98%   Weight:    178 lb 8 oz (81 kg)  Height:       Brisk posterior tibial signal at the Doppler. Dressing change on right foot and still looks good.  LABS:   Lab Results  Component Value Date   WBC 12.6 (H) 10/26/2016   HGB 8.8 (L) 10/26/2016   HCT 26.7 (L) 10/26/2016   MCV 87.3 10/26/2016   PLT 316 10/26/2016   Lab Results  Component Value Date   CREATININE 1.09 (H) 10/25/2016   Lab Results  Component Value Date   INR 1.24 10/22/2016   CBG (last 3)   Recent Labs  10/27/16 1629 10/27/16 2045 10/28/16 0625  GLUCAP 159* 127* 116*    PROBLEM LIST:    Principal Problem:   PAD (peripheral artery disease) (HCC) Active Problems:   Acute respiratory failure with hypoxia (HCC)   Gangrene of left foot (HCC)   Sepsis (HCC)   CURRENT MEDS:   . aspirin  81 mg Oral Daily  . clopidogrel  75 mg Oral Daily  . docusate sodium  100 mg Oral Daily  . heparin subcutaneous  5,000 Units Subcutaneous Q8H  . insulin aspart  0-20 Units Subcutaneous TID WC  . insulin detemir  10 Units  Subcutaneous QHS  . isosorbide mononitrate  90 mg Oral QHS  . metoprolol succinate  75 mg Oral Daily  . pantoprazole  40 mg Oral Daily  . sodium chloride flush  3 mL Intravenous Q12H    Cari Caraway Beeper: [phone] Office: [phone] 10/28/2016

## 2016-10-29 ENCOUNTER — Encounter: Payer: Self-pay | Admitting: Internal Medicine

## 2016-10-29 ENCOUNTER — Encounter: Payer: Self-pay | Admitting: *Deleted

## 2016-10-29 ENCOUNTER — Ambulatory Visit: Payer: Medicare Other | Admitting: Cardiovascular Disease

## 2016-10-29 ENCOUNTER — Non-Acute Institutional Stay (SKILLED_NURSING_FACILITY): Payer: Medicare Other | Admitting: Internal Medicine

## 2016-10-29 DIAGNOSIS — I96 Gangrene, not elsewhere classified: Secondary | ICD-10-CM | POA: Diagnosis not present

## 2016-10-29 DIAGNOSIS — D62 Acute posthemorrhagic anemia: Secondary | ICD-10-CM | POA: Diagnosis not present

## 2016-10-29 DIAGNOSIS — E1142 Type 2 diabetes mellitus with diabetic polyneuropathy: Secondary | ICD-10-CM | POA: Diagnosis not present

## 2016-10-29 DIAGNOSIS — I2510 Atherosclerotic heart disease of native coronary artery without angina pectoris: Secondary | ICD-10-CM | POA: Diagnosis not present

## 2016-10-29 DIAGNOSIS — E785 Hyperlipidemia, unspecified: Secondary | ICD-10-CM

## 2016-10-29 DIAGNOSIS — I110 Hypertensive heart disease with heart failure: Secondary | ICD-10-CM

## 2016-10-29 DIAGNOSIS — I739 Peripheral vascular disease, unspecified: Secondary | ICD-10-CM | POA: Diagnosis not present

## 2016-10-29 DIAGNOSIS — K219 Gastro-esophageal reflux disease without esophagitis: Secondary | ICD-10-CM

## 2016-10-29 DIAGNOSIS — I5032 Chronic diastolic (congestive) heart failure: Secondary | ICD-10-CM

## 2016-10-29 NOTE — Progress Notes (Signed)
Location:  [facility] Room Number: 505 Place of Service:  SNF [phone])  Provider: Randon Goldsmith. Lyn Hollingshead, MD  Georgann Housekeeper, MD  Patient Care Team: Georgann Housekeeper, MD as PCP - General (Internal Medicine) Runell Gess, MD as Consulting Physician (Cardiology) [name], MD as Consulting Physician (Orthopedic Surgery)  Extended Emergency Contact Information Primary Emergency Contact: Elizebeth Koller of [place] Home Phone: [phone] Mobile Phone: [phone] Relation: Daughter    Allergies: Doxycycline; Hydrochlorothiazide; Latex; and Penicillins  Chief Complaint  Patient presents with  . New Admit To SNF    following hospitalization 10/17/16 to 10/28/16 gangrene of right forefoot with infrainguinal arterial occlusive disease     HPI: Patient is 66 y.o. female  with long history of bilateral lotion and claudication, and 9 2006 status post PTCA, history right femoropopliteal in 2017 who is here today prior to intervention by vascular surgery for gangrene of the right forefoot and evidence of multiple level arterial occlusive disease. She had a toenail removed from right great toe and subsequently developed wounds on the right first second and third toes. Arteriogram showed a right common femoral occlusion and superficial femoral artery occlusion. Patient was admitted to [facility] from 9/20-10/1 for a right common femoral artery endarterectomy, thrombectomy of femoral artery and a right femoral to below-knee popliteal artery bypass and ray amputation of right first second and third toes. Initial surgery was canceled because of respiratory issues with hypoxia. Patient was placed on Maxipime and vancomycin, respiratory status recovered and subsequently bypassed with no problems. Hospital course was complicated by transfusion of 1 unit packed RBCs. Patient is admitted to skilled nursing facility for OT/PT. While at skilled nursing facility  patient will be followed for hypertension treated with Norvasc and Lasix, Imdur, losartan, and metoprolol, diabetes mellitus 2 treated with Glucophage and Levemir and coronary artery disease treated with aspirin, metoprolol, Imdur, statin, and ARB.  Past Medical History:  Diagnosis Date  . Anemia 10/2015   Acute Blood Loss  . Arthritis    "feel like I have it all over" (08/28/2015)  . CHF (congestive heart failure) (HCC)   . Complication of anesthesia    DIFFICULT WAKING "only when I was smoking; no problems since I quit"  . Coronary artery disease   . Family history of adverse reaction to anesthesia    sister slow to wake up  . GERD (gastroesophageal reflux disease)    takes Protonix daily   . Hip bursitis   . History of blood transfusion    10/2015  . Hyperlipidemia LDL goal < 70 06/28/2013   takes Atorvastatin daily  . Hypertension    takes Metoprolol and Imdur daily  . Malnutrition (HCC)   . Migraine    "none in a long time" (08/28/2015)  . Myocardial infarction (HCC) 2011  . Neuromuscular disorder (HCC)    DIABETIC NEUROPATHY  . Osteomyelitis (HCC) 2017   Left foot  . PAD (peripheral artery disease) (HCC)   . Peripheral vascular disease (HCC)   . Respiratory failure (HCC) 10/2015   Acute Hypoxia- acute pulmonary edema 11/13/2015  . Septic shock (HCC) 10/2015  . Stroke (HCC)   . Type II diabetes mellitus (HCC)    takes Lantus nightly.Average fasting blood sugar runs 80-90  Type II    Past Surgical History:  Procedure Laterality Date  . ABDOMINAL HYSTERECTOMY    . AMPUTATION Right 10/22/2016   Procedure: AMPUTATION DIGIT RIGHT TOES 1-3 POSSIBLE TRANSMETATARSAL;  Surgeon: Chuck Hint, MD;  Location: [facility] OR;  Service: Vascular;  Laterality: Right;  . APPENDECTOMY    . ATHERECTOMY N/A 06/04/2011   Procedure: ATHERECTOMY;  Surgeon: Runell Gess, MD;  Location: [facility] CATH LAB;  Service: Cardiovascular;  Laterality: N/A;  . CARDIAC CATHETERIZATION  10/13/2009   95%  stenosis in the AV groove circumflex and 95% ostial stenosis in small OM3. A 3x15mm drug-eluting Promus stent inserted ito the circumflex. Dilatated with a 3.25x59mm noncompliant Quantum balloon within entire segment. The entire region was reduced to 0% and brisk TIMI3 flow.  . CAROTID DUPLEX  03/19/2011   Right ICA-demonstrates complete occlusion. Left ICA-demonstrates a small amount of fibrous plaque.  Marland Kitchen CATARACT EXTRACTION W/ INTRAOCULAR LENS IMPLANT Right   . CESAREAN SECTION  1990  . CORONARY ANGIOPLASTY    . ENDARTERECTOMY FEMORAL Left 09/05/2015   Procedure: ENDARTERECTOMY FEMORAL WITH PROFUNDOPLASTY;  Surgeon: Chuck Hint, MD;  Location: [facility] OR;  Service: Vascular;  Laterality: Left;  Left common femoral artery vein patch using left saphenous vien  . ENDARTERECTOMY FEMORAL Right 10/22/2016   Procedure: ENDARTERECTOMY RIGHT COMMON FEMORAL;  Surgeon: Chuck Hint, MD;  Location: [facility] OR;  Service: Vascular;  Laterality: Right;  . FEMORAL-POPLITEAL BYPASS GRAFT Left 11/02/2015   Procedure: BYPASS GRAFT FEMORAL-POPLITEAL ARTERY VS FEMORAL-TIBIAL ARTERY BYPASS;  Surgeon: Chuck Hint, MD;  Location: [facility] OR;  Service: Vascular;  Laterality: Left;  . FEMORAL-POPLITEAL BYPASS GRAFT Right 10/22/2016   Procedure: BYPASS GRAFT RIGHT FEMORAL- BELOW KNEE POPLITEAL ARTERY WITH VEIN;  Surgeon: Chuck Hint, MD;  Location: [facility] OR;  Service: Vascular;  Laterality: Right;  . I&D EXTREMITY Left 11/10/2015   Procedure: Debridement Left Foot Ulcer, Application  Wound VAC;  Surgeon: [name], MD;  Location: [facility] OR;  Service: Orthopedics;  Laterality: Left;  . ILIAC ARTERY STENT Left 08/28/2015   common  . INTRAOPERATIVE ARTERIOGRAM Left 09/05/2015   Procedure: INTRA OPERATIVE ARTERIOGRAM;  Surgeon: Chuck Hint, MD;  Location: [facility] OR;  Service: Vascular;  Laterality: Left;  . INTRAOPERATIVE ARTERIOGRAM Left 11/02/2015   Procedure: INTRA OPERATIVE ARTERIOGRAM;  Surgeon:  Chuck Hint, MD;  Location: [facility] OR;  Service: Vascular;  Laterality: Left;  . INTRAOPERATIVE ARTERIOGRAM Right 10/22/2016   Procedure: INTRA OPERATIVE ARTERIOGRAM;  Surgeon: Chuck Hint, MD;  Location: [facility] OR;  Service: Vascular;  Laterality: Right;  . LEXISCAN MYOVIEW  10/25/2010   Moderate perfusion defect due to infarct/scar with mild perinfarct ischemia seen in the Basal Inferolateral, Basal Anterolateral, Mid Inferolateral, and Mid Anterolateral regions. Post-stress EF is 50%.  . LOWER EXTREMITY ANGIOGRAPHY N/A 10/17/2016   Procedure: Lower Extremity Angiography;  Surgeon: Runell Gess, MD;  Location: [facility] INVASIVE CV LAB;  Service: Cardiovascular;  Laterality: N/A;  . OVARY SURGERY  1983?   "ruptured"  . PERIPHERAL VASCULAR ANGIOGRAM  01/26/2010   High-grade SFA disease: left greater than right. Left SFA would require fem-pop bypass grafting. Right SFA could be stented but might require Diamondback Orbital atherectomy.  Marland Kitchen PERIPHERAL VASCULAR ANGIOGRAM  02/23/2010   Stealth Predator orbital rotational atherectomy performed on SFA & Popliteal up to 90,000 RPM. Stenting using overlapping 5x131mm and 5x42mm Absolute Pro Nitinol self-expanding stents beginning just at the knee up to the mid SFA resulting in reduction of 90-95% calcified SFA & Popliteal stenosis to 0. Stenting performed on the distal common & proximal iliac artery with a 10x4 Absolute Pro- 70-0%.  Marland Kitchen PERIPHERAL VASCULAR ANGIOGRAM  06/17/2010   PTA performed  to the right external iliac artery stent using a 5x100 balloon at 10 atmospheres. Stenting performed using a 6x18 Genesis on Opta balloon. Postdilatation with a 7x2 balloon resulting in a 95% "in-stent" stenosis to 0% residual.  . PERIPHERAL VASCULAR ANGIOGRAM  06/04/2011   Bilateral total SFAs not percutaneously addressable. Good canidate for femoropopliteal bypass grafting  . PERIPHERAL VASCULAR ANGIOGRAM  08/28/2015  . PERIPHERAL VASCULAR CATHETERIZATION N/A  08/28/2015   Procedure: Lower Extremity Angiography;  Surgeon: Runell Gess, MD;  Location: [facility] INVASIVE CV LAB;  Service: Cardiovascular;  Laterality: N/A;  . PERIPHERAL VASCULAR CATHETERIZATION N/A 08/28/2015   Procedure: Abdominal Aortogram;  Surgeon: Runell Gess, MD;  Location: [facility] INVASIVE CV LAB;  Service: Cardiovascular;  Laterality: N/A;  . PERIPHERAL VASCULAR CATHETERIZATION Left 08/28/2015   Procedure: Peripheral Vascular Intervention;  Surgeon: Runell Gess, MD;  Location: [facility] INVASIVE CV LAB;  Service: Cardiovascular;  Laterality: Left;  common iliac  . PERIPHERAL VASCULAR CATHETERIZATION Left 08/28/2015   Procedure: Peripheral Vascular Atherectomy;  Surgeon: Runell Gess, MD;  Location: [facility] INVASIVE CV LAB;  Service: Cardiovascular;  Laterality: Left;  common iliac  . PERIPHERAL VASCULAR CATHETERIZATION N/A 09/28/2015   Procedure: Lower Extremity Angiography;  Surgeon: Runell Gess, MD;  Location: [facility] INVASIVE CV LAB;  Service: Cardiovascular;  Laterality: N/A;  . PERIPHERAL VASCULAR CATHETERIZATION Left 09/28/2015   Procedure: Peripheral Vascular Intervention;  Surgeon: Runell Gess, MD;  Location: [facility] INVASIVE CV LAB;  Service: Cardiovascular;  Laterality: Left CFA  PCI with 9 mm x 4 cm Abbott nitinol absolute Pro self-expanding stent     . SKIN SPLIT GRAFT Left 12/01/2015   Procedure: LEFT FOOT SKIN GRAFT AND VAC;  Surgeon: [name], MD;  Location: [facility] OR;  Service: Orthopedics;  Laterality: Left;  . THROMBECTOMY FEMORAL ARTERY Right 10/22/2016   Procedure: THROMBECTOMY FEMORAL ARTERY;  Surgeon: Chuck Hint, MD;  Location: [facility] OR;  Service: Vascular;  Laterality: Right;  . TRANSTHORACIC ECHOCARDIOGRAM  10/17/2009   EF 45-50%, moderate hypokinesis of the entire inferolateral myocardium, mild concentric hypertrophy and mild regurg of the mitral valva.  Marland Kitchen VEIN HARVEST Left 11/02/2015   Procedure: LEFT GREATER SAPHENOUS VEIN HARVEST;  Surgeon: Chuck Hint, MD;  Location: [facility] OR;  Service: Vascular;  Laterality: Left;    Allergies as of 10/29/2016      Reactions   Doxycycline Other (See Comments)   lethargy   Hydrochlorothiazide Other (See Comments)   lethargic    Latex Rash   Penicillins Swelling, Rash   Pt states she has tolerated Keflex in the past without problems. States she may have tolerated Augmentin in the past but it caused GI upset. Has patient had a PCN reaction causing immediate rash, facial/tongue/throat swelling, SOB or lightheadedness with hypotension: Yes Has patient had a PCN reaction causing severe rash involving mucus membranes or skin necrosis: No Has patient had a PCN reaction that required hospitalization No Has patient had a PCN reaction occurring within the last 10 years: No      Medication List       Accurate as of 10/29/16 12:03 PM. Always use your most recent med list.          amLODipine 5 MG tablet Commonly known as:  NORVASC TAKE 1 TABLET(5 MG) BY MOUTH DAILY   aspirin EC 81 MG tablet Take 81 mg by mouth daily.   ciprofloxacin 500 MG tablet Commonly known as:  CIPRO Take 1 tablet (500 mg total) by  mouth 2 (two) times daily.   clopidogrel 75 MG tablet Commonly known as:  PLAVIX Take 75 mg by mouth. Take one tablet daily for PVD   furosemide 40 MG tablet Commonly known as:  LASIX Take 40 mg by mouth daily.   gabapentin 300 MG capsule Commonly known as:  NEURONTIN Take 300 mg by mouth 3 (three) times daily.   isosorbide mononitrate 60 MG 24 hr tablet Commonly known as:  IMDUR 60 mg. Take 1 tablet by mouth along with 30 mg to equal 90 mg daily   isosorbide mononitrate 30 MG 24 hr tablet Commonly known as:  IMDUR 30 mg. Take 1 tablet along by mouth with 60 mg to equal 90 mg daily.   LEVEMIR FLEXTOUCH 100 UNIT/ML Pen Generic drug:  Insulin Detemir INJECT 30 UNITS INTO THE SKIN HS UTD. DISCONTINUE LANTUS   losartan 100 MG tablet Commonly known as:  COZAAR TAKE 1 TABLET BY MOUTH  EVERY DAY   metFORMIN 1000 MG tablet Commonly known as:  GLUCOPHAGE Take 1,000 mg by mouth 2 (two) times daily with a meal.   metoprolol succinate 50 MG 24 hr tablet Commonly known as:  TOPROL-XL TAKE 1 AND 1/2 TABLET BY MOUTH DAILY   oxycodone 5 MG capsule Commonly known as:  OXY-IR Take 5 mg by mouth. Take two tablets every 4 hours as needed for severe pain stop 11/11/16   pantoprazole 40 MG tablet Commonly known as:  PROTONIX TAKE 1 TABLET BY MOUTH DAILY   potassium chloride 10 MEQ tablet Commonly known as:  K-DUR,KLOR-CON Take 10 mEq by mouth daily.   sulfamethoxazole-trimethoprim 400-80 MG tablet Commonly known as:  BACTRIM,SEPTRA Take 1 tablet by mouth 2 (two) times daily.       Meds ordered this encounter  Medications  . oxycodone (OXY-IR) 5 MG capsule    Sig: Take 5 mg by mouth. Take two tablets every 4 hours as needed for severe pain stop 11/11/16  . clopidogrel (PLAVIX) 75 MG tablet    Sig: Take 75 mg by mouth. Take one tablet daily for PVD    Immunization History  Administered Date(s) Administered  . PPD Test 11/14/2015  . Pneumococcal Polysaccharide-23 10/23/2016    Social History  Substance Use Topics  . Smoking status: Former Smoker    Packs/day: 1.50    Years: 41.00    Types: Cigarettes    Quit date: 10/12/2009  . Smokeless tobacco: Never Used  . Alcohol use No    Review of Systems  DATA OBTAINED: from patient, nurse GENERAL:  no fevers, fatigue, appetite changes SKIN: No itching, rash HEENT: No complaint RESPIRATORY: No cough, wheezing, SOB CARDIAC: No chest pain, palpitations, lower extremity edema  GI: No abdominal pain, No N/V/D or constipation, No heartburn or reflux  GU: No dysuria, frequency or urgency, or incontinence  MUSCULOSKELETAL: No unrelieved bone/joint pain NEUROLOGIC: No headache, dizziness  PSYCHIATRIC: No overt anxiety or sadness  Vitals:   10/29/16 0851  BP: (!) 180/86  Pulse: 91  Resp: 20  Temp: 97.6 F (36.4  C)   Body mass index is 30.55 kg/m. Physical Exam  GENERAL APPEARANCE: Alert, conversant, No acute distress  SKIN: No diaphoresis rash HEENT: Unremarkable RESPIRATORY: Breathing is even, unlabored. Lung sounds are clear   CARDIOVASCULAR: Heart RRR no murmurs, rubs or gallops. No peripheral edema  GASTROINTESTINAL: Abdomen is soft, non-tender, not distended w/ normal bowel sounds.  GENITOURINARY: Bladder non tender, not distended  MUSCULOSKELETAL: R Leg dressed; area without erythema or unusual  swelling NEUROLOGIC: Cranial nerves 2-12 grossly intact. Moves all extremities PSYCHIATRIC: Mood and affect appropriate to situation, no behavioral issues  Patient Active Problem List   Diagnosis Date Noted  . Gangrene of left foot (HCC) 10/17/2016  . Sepsis (HCC) 10/17/2016  . Midfoot ulcer, left, limited to breakdown of skin (HCC) 01/08/2016  . Thigh hematoma 12/22/2015  . History of blood transfusion 12/22/2015  . Acute on chronic systolic CHF (congestive heart failure) (HCC) 12/22/2015  . H/O skin graft 12/11/2015  . NSTEMI (non-ST elevated myocardial infarction) (HCC) 11/19/2015  . DM type 2 with diabetic peripheral neuropathy (HCC) 11/19/2015  . Traumatic hemorrhagic shock (HCC)   . Chronic systolic CHF (congestive heart failure) (HCC)   . Uncontrolled type 2 diabetes mellitus with complication (HCC)   . Demand myocardial infarction (HCC) 11/08/2015  . Surgery, elective   . Central line infection, initial encounter   . Septic shock (HCC) 11/03/2015  . Encephalopathy acute 11/03/2015  . Acute blood loss anemia 11/03/2015  . Acute respiratory failure with hypoxia (HCC)   . CVA (cerebral vascular accident) (HCC)   . S/P femoral-popliteal bypass surgery   . Malnutrition of moderate degree 11/01/2015  . Diabetic ulcer of left midfoot associated with diabetes mellitus due to underlying condition, with muscle involvement without evidence of necrosis (HCC)   . Hypertensive heart disease  with chronic diastolic congestive heart failure (HCC) 10/31/2015  . Osteomyelitis of left foot (HCC) 10/30/2015  . PVD (peripheral vascular disease) with claudication (HCC) 09/28/2015  . Critical lower limb ischemia 08/28/2015  . Hyperlipidemia with target LDL less than 70 06/28/2013  . Diabetes mellitus type II, uncontrolled (HCC) 03/03/2013  . Complicated migraine 03/03/2013  . Stroke-like symptom 03/02/2013  . Diabetes mellitus (HCC) 03/02/2013  . Aphasia 03/02/2013  . Headache(784.0) 03/02/2013  . PAD (peripheral artery disease) (HCC) 06/05/2011  . Coronary artery disease involving native coronary artery of native heart without angina pectoris 06/05/2011  . Hypotension 06/05/2011  . Claudication in peripheral vascular disease:  Lifestyle limiting. 06/05/2011  . Hx of tobacco use, presenting hazards to health 06/05/2011    CMP     Component Value Date/Time   NA 136 10/25/2016 0309   NA 142 12/22/2015   K 4.7 10/25/2016 0309   CL 109 10/25/2016 0309   CO2 22 10/25/2016 0309   GLUCOSE 160 (H) 10/25/2016 0309   BUN 25 (H) 10/25/2016 0309   BUN 9 12/22/2015   CREATININE 1.09 (H) 10/25/2016 0309   CREATININE 0.73 01/02/2016 1555   CALCIUM 8.1 (L) 10/25/2016 0309   PROT 6.7 12/01/2015 1207   ALBUMIN 2.9 (L) 12/01/2015 1207   AST 19 12/01/2015 1207   ALT 9 (L) 12/01/2015 1207   ALKPHOS 72 12/01/2015 1207   BILITOT 0.5 12/01/2015 1207   GFRNONAA 52 (L) 10/25/2016 0309   GFRAA 60 (L) 10/25/2016 0309    Recent Labs  11/02/15 2202  10/19/16 0343  10/22/16 0318 10/22/16 1033 10/23/16 0426 10/25/16 0309  NA  --   < > 134*  < > 137 139 137 136  K  --   < > 5.1  < > 4.4 5.0 5.1 4.7  CL  --   < > 106  < > 109  --  110 109  CO2  --   < > 20*  < > 22  --  20* 22  GLUCOSE  --   < > 170*  < > 98 147* 135* 160*  BUN  --   < >  33*  < > 21*  --  20 25*  CREATININE  --   < > 1.20*  < > 1.01*  --  0.96 1.09*  CALCIUM  --   < > 8.6*  < > 8.3*  --  8.2* 8.1*  MG 1.2*  --  1.6*  --    --   --   --   --   PHOS  --   --  3.6  --   --   --   --   --   < > = values in this interval not displayed.  Recent Labs  11/05/15 0458 11/07/15 0447 12/01/15 1207  AST ALT 12* 15 9*  ALKPHOS 74 85 72  BILITOT 0.4 0.6 0.5  PROT 5.0* 5.6* 6.7  ALBUMIN 1.4* 1.5* 2.9*    Recent Labs  11/08/15 0530  01/02/16 1555  10/17/16 2230  10/23/16 0426 10/25/16 0309 10/26/16 0740  WBC 11.8*  < > 9.4  < > 21.1*  < > 12.6* 13.7* 12.6*  NEUTROABS 8.5*  --  5,734  --  16.7*  --   --   --   --   HGB 8.3*  < > 11.2*  < > 10.5*  < > 7.8* 6.7* 8.8*  HCT 25.0*  < > 34.0*  < > 31.5*  < > 23.2* 20.5* 26.7*  MCV 85.9  < > 85.6  < > 86.1  < > 83.8 86.1 87.3  PLT 448*  < > 293  < > 429*  < > 358 284 316  < > = values in this interval not displayed.  Recent Labs  11/05/15 0458  CHOL 91  LDLCALC 44  TRIG 109   No results found for: [facility] Lab Results  Component Value Date   TSH 1.05 08/24/2015   Lab Results  Component Value Date   HGBA1C 7.6 (H) 11/02/2015   Lab Results  Component Value Date   CHOL 91 11/05/2015   HDL 25 (L) 11/05/2015   LDLCALC 44 11/05/2015   TRIG 109 11/05/2015   CHOLHDL 3.6 11/05/2015    Significant Diagnostic Results in last 30 days:  Dg Ang/ext/uni/or Right  Result Date: 10/22/2016 CLINICAL DATA:  Right lower extremity ischemia and gangrene of foot. EXAM: RIGHT ANG/EXT/UNI/ OR COMPARISON:  None. FINDINGS: Intraoperative image shows opacification of the distal end of a femoral to popliteal venous bypass graft with anastomosis to the popliteal artery below the knee. Tibioperoneal trunk, peroneal and posterior tibial arteries appear patent. The anterior tibial artery appears occluded in the proximal calf. IMPRESSION: Open right femoral to below-knee popliteal bypass graft with posterior tibial and peroneal runoff. Electronically Signed   By: Irish Lack M.D.   On: 10/22/2016 16:11   Dg Chest Port 1 View  Result Date: 10/21/2016 CLINICAL DATA:   Acute hypoxemic respiratory failure. History of CHF, previous MI EXAM: PORTABLE CHEST 1 VIEW COMPARISON:  Portable chest x-ray of October 18, 2016 FINDINGS: The lungs are adequately inflated. The interstitial markings remain increased but have improved somewhat since the previous study. The cardiac silhouette remains enlarged. The pulmonary vascularity is less engorged. There is calcification in the wall of the thoracic aorta. The bony thorax exhibits no acute abnormality. IMPRESSION: Mild improvement in the pulmonary interstitium suggests improving interstitial edema or interstitial pneumonia. Stable mild cardiomegaly. No alveolar pneumonia. Thoracic aortic atherosclerosis. Electronically Signed   By: David  [place] M.D.   On: 10/21/2016 12:20   Dg Chest [facility]  1 View  Result Date: 10/18/2016 CLINICAL DATA:  Pulmonary edema. EXAM: PORTABLE CHEST 1 VIEW COMPARISON:  10/17/2016.  05/31/2016 . FINDINGS: Cardiomegaly with pulmonary vascular prominence and bilateral interstitial prominence consistent with CHF. Other etiologies of interstitial prominence including pneumonitis cannot be excluded. No pleural effusion or pneumothorax . IMPRESSION: Findings consistent with congestive heart failure with pulmonary interstitial edema. Other etiologies of interstitial prominence including interstitial pneumonitis cannot be excluded. No change from 10/17/2016. Electronically Signed   By: Maisie Fus  Register   On: 10/18/2016 13:04   Dg Chest Port 1v Same Day  Result Date: 10/17/2016 CLINICAL DATA:  Shortness of breath EXAM: PORTABLE CHEST 1 VIEW COMPARISON:  05/31/2016 FINDINGS: Probable tiny right pleural effusion. Heart size within normal limits. Diffuse increased interstitial opacity, may reflect mild edema or interstitial inflammatory process. Aortic atherosclerosis. No pneumothorax. IMPRESSION: Possible tiny right effusion. Diffuse increased interstitial opacity, may reflect mild interstitial edema or infection.  Electronically Signed   By: Jasmine Pang M.D.   On: 10/17/2016 21:07    Assessment and Plan  GANGRENE OF RIGHT FOOT/ARTERIAL OCCLUSIVE DISEASE-patient is status post femoropopliteal and ray amputation of toes one through 3 and thrombectomy of femoral artery  SNF - patient moved to skilled nursing facility for OT/PT; patient to consider Cipro 500 mg by mouth twice a day 2 weeks and Plavix 75 mg by mouth daily  ACUTE BLOOD LOSS ANEMIA postop-status post transfusion 1 PRBC SNF- DC hemoglobin 8.8; will follow-up CBC  HYPERTENSION SNF - not well controlled; will give it another day as patient is on multiple medications already; plan to continue Norvasc 5 mg daily, Lasix 40 mg daily, Imdur 90 mg daily losartan 100 mg daily and metoprolol XL 75 mg daily; will be monitoring every shift and make adjustments as needed  CAD SNF -not stated as uncontrolled; plan to continue ASA 81 mg daily, metoprolol XL 75 mg daily, Imdur 90 mg daily and statin  HYPERLIPIDEMIA SNF -not stated as uncontrolled plan to continue Lipitor 80 mg by mouth daily  GERD SNF - plan to continue Protonix 40 mg daily    Time spent greater than 45 minutes;> 50% of time with patient was spent reviewing records, labs, tests and studies, counseling and developing plan of care  Thurston Hole D. Lyn Hollingshead, MD

## 2016-10-31 ENCOUNTER — Encounter: Payer: Self-pay | Admitting: Internal Medicine

## 2016-11-01 ENCOUNTER — Encounter: Payer: Self-pay | Admitting: Internal Medicine

## 2016-11-01 DIAGNOSIS — I96 Gangrene, not elsewhere classified: Secondary | ICD-10-CM

## 2016-11-01 DIAGNOSIS — K219 Gastro-esophageal reflux disease without esophagitis: Secondary | ICD-10-CM | POA: Insufficient documentation

## 2016-11-01 HISTORY — DX: Gangrene, not elsewhere classified: I96

## 2016-11-12 ENCOUNTER — Encounter: Payer: Self-pay | Admitting: Vascular Surgery

## 2016-11-12 ENCOUNTER — Other Ambulatory Visit: Payer: Self-pay | Admitting: *Deleted

## 2016-11-12 NOTE — Patient Outreach (Signed)
[facility]) Care Management  11/12/2016  [name] [name] Jun 20, 1950 [id number]   Met with [name] [name], SW at facility, she reports that patient will go home with daughter upon discharge.  Met with patient at bedside. Patient sitting in w/c, neatly groomed and dressed.  Patient reports she had 3 toes amputated and is working on getting stronger to go home.  She reports she manages her medications, she was driving before this admission. She has a scale for daily weights to manage HF>   RNCM reviewed [facility] program. Patient asked some questions about program. RNCM answered all questions Patient requested a brochure but did not want to sign up at this time.   Plan to sign off, patient has information for future reference. Royetta Crochet. Laymond Purser, RN, BSN, [place] [phone]) Business Cell  [phone]) Toll Free Office

## 2016-11-13 ENCOUNTER — Encounter: Payer: Self-pay | Admitting: Vascular Surgery

## 2016-11-13 ENCOUNTER — Ambulatory Visit (INDEPENDENT_AMBULATORY_CARE_PROVIDER_SITE_OTHER): Payer: Self-pay | Admitting: Vascular Surgery

## 2016-11-13 VITALS — BP 125/70 | HR 77 | Temp 98.9°F | Resp 16 | Ht 64.0 in | Wt 164.0 lb

## 2016-11-13 DIAGNOSIS — Z48812 Encounter for surgical aftercare following surgery on the circulatory system: Secondary | ICD-10-CM

## 2016-11-13 NOTE — Progress Notes (Signed)
Patient name: [name] [name] MRN: [id number] DOB: 1950-03-17 Sex: female  REASON FOR VISIT:   Follow up after   HPI:   [name] [name] is a pleasant 66 y.o. female comes in for follow up after right femoral and external iliac artery endarterectomy with profundoplasty and right femoral to below-knee popliteal artery bypass with vein in addition to ray amputation of the right first second and third toes.  This was performed on 10/22/2016. She has been at the skilled nursing facility. Her only complaint has been some swelling in the right leg as expected. She denies fever or chills. She has completed a course of Cipro.  Current Outpatient Prescriptions  Medication Sig Dispense Refill  . amLODipine (NORVASC) 5 MG tablet TAKE 1 TABLET(5 MG) BY MOUTH DAILY 30 tablet 0  . aspirin EC 81 MG tablet Take 81 mg by mouth daily.    . clopidogrel (PLAVIX) 75 MG tablet Take 75 mg by mouth. Take one tablet daily for PVD    . furosemide (LASIX) 40 MG tablet Take 40 mg by mouth daily.     Marland Kitchen gabapentin (NEURONTIN) 300 MG capsule Take 300 mg by mouth 3 (three) times daily.     . isosorbide mononitrate (IMDUR) 30 MG 24 hr tablet 30 mg. Take 1 tablet along by mouth with 60 mg to equal 90 mg daily.    . isosorbide mononitrate (IMDUR) 60 MG 24 hr tablet 60 mg. Take 1 tablet by mouth along with 30 mg to equal 90 mg daily    . LEVEMIR FLEXTOUCH 100 UNIT/ML Pen INJECT 30 UNITS INTO THE SKIN HS UTD. DISCONTINUE LANTUS  3  . losartan (COZAAR) 100 MG tablet TAKE 1 TABLET BY MOUTH EVERY DAY 90 tablet 1  . metFORMIN (GLUCOPHAGE) 1000 MG tablet Take 1,000 mg by mouth 2 (two) times daily with a meal.    . metoprolol succinate (TOPROL-XL) 50 MG 24 hr tablet TAKE 1 AND 1/2 TABLET BY MOUTH DAILY 45 tablet 5  . pantoprazole (PROTONIX) 40 MG tablet TAKE 1 TABLET BY MOUTH DAILY 90 tablet 3  . potassium chloride (K-DUR,KLOR-CON) 10 MEQ tablet Take 10 mEq by mouth daily.     Marland Kitchen sulfamethoxazole-trimethoprim (BACTRIM,SEPTRA) 400-80 MG  tablet Take 1 tablet by mouth 2 (two) times daily.    . ciprofloxacin (CIPRO) 500 MG tablet Take 500 mg by mouth 2 (two) times daily.    Marland Kitchen oxycodone (OXY-IR) 5 MG capsule Take 5 mg by mouth. Take two tablets every 4 hours as needed for severe pain stop 11/11/16     No current facility-administered medications for this visit.     REVIEW OF SYSTEMS:  [X]  denotes positive finding, [ ]  denotes negative finding Cardiac  Comments:  Chest pain or chest pressure:    Shortness of breath upon exertion:    Short of breath when lying flat:    Irregular heart rhythm:    Constitutional    Fever or chills:     PHYSICAL EXAM:   Vitals:   11/13/16 1622  BP: 125/70  Pulse: 77  Resp: 16  Temp: 98.9 F (37.2 C)  SpO2: 100%  Weight: 164 lb (74.4 kg)  Height: 5\' 4"  (1.626 m)    GENERAL: The patient is a well-nourished female, in no acute distress. The vital signs are documented above. CARDIOVASCULAR: There is a regular rate and rhythm. PULMONARY: There is good air exchange bilaterally without wheezing or rales. Her right groin incision looks okay with some minimal separation at  the inferior aspect of the incision. She has a brisk posterior tibial signal with the Doppler and a brisk anterior tibial signal. She has moderate swelling of the right leg. Her toe amputation site looks good with minimal erythema  DATA:   No new data  MEDICAL ISSUES:   STATUS POST RIGHT LOWER EXTREMITY REVASCULARIZATION AND AMPUTATION OF THE RIGHT FIRST SECOND AND THIRD TOES: The patient is doing well status post extensive bypass in the right leg and toe amputations. At this point, the most important thing is aggressive wound care at the skilled nursing facility. I have instructed them to wash the groin daily with soap and water and to keep dry gauze in the groin. I've also instructed them to elevate her leg to get the swelling down which will facilitate healing of her toe indications sites. She has excellent perfusion  and there is no reason not to fully elevate her leg as I have instructed her. I'll see her back in 2 weeks and we might potentially be able to get some or all of her sutures out of her right foot. She will need a follow up visit 3 months postop for graft duplex and ABIs.   Waverly Ferrariickson, Alem [facility]ahl [facility] [phone]

## 2016-11-17 IMAGING — CR DG ABD PORTABLE 1V
1 series · 1 of 1 positions shown · non-contrast
Comparison: None.

CLINICAL DATA: Orogastric tube placement.

EXAM:
PORTABLE ABDOMEN - 1 VIEW

[AP]
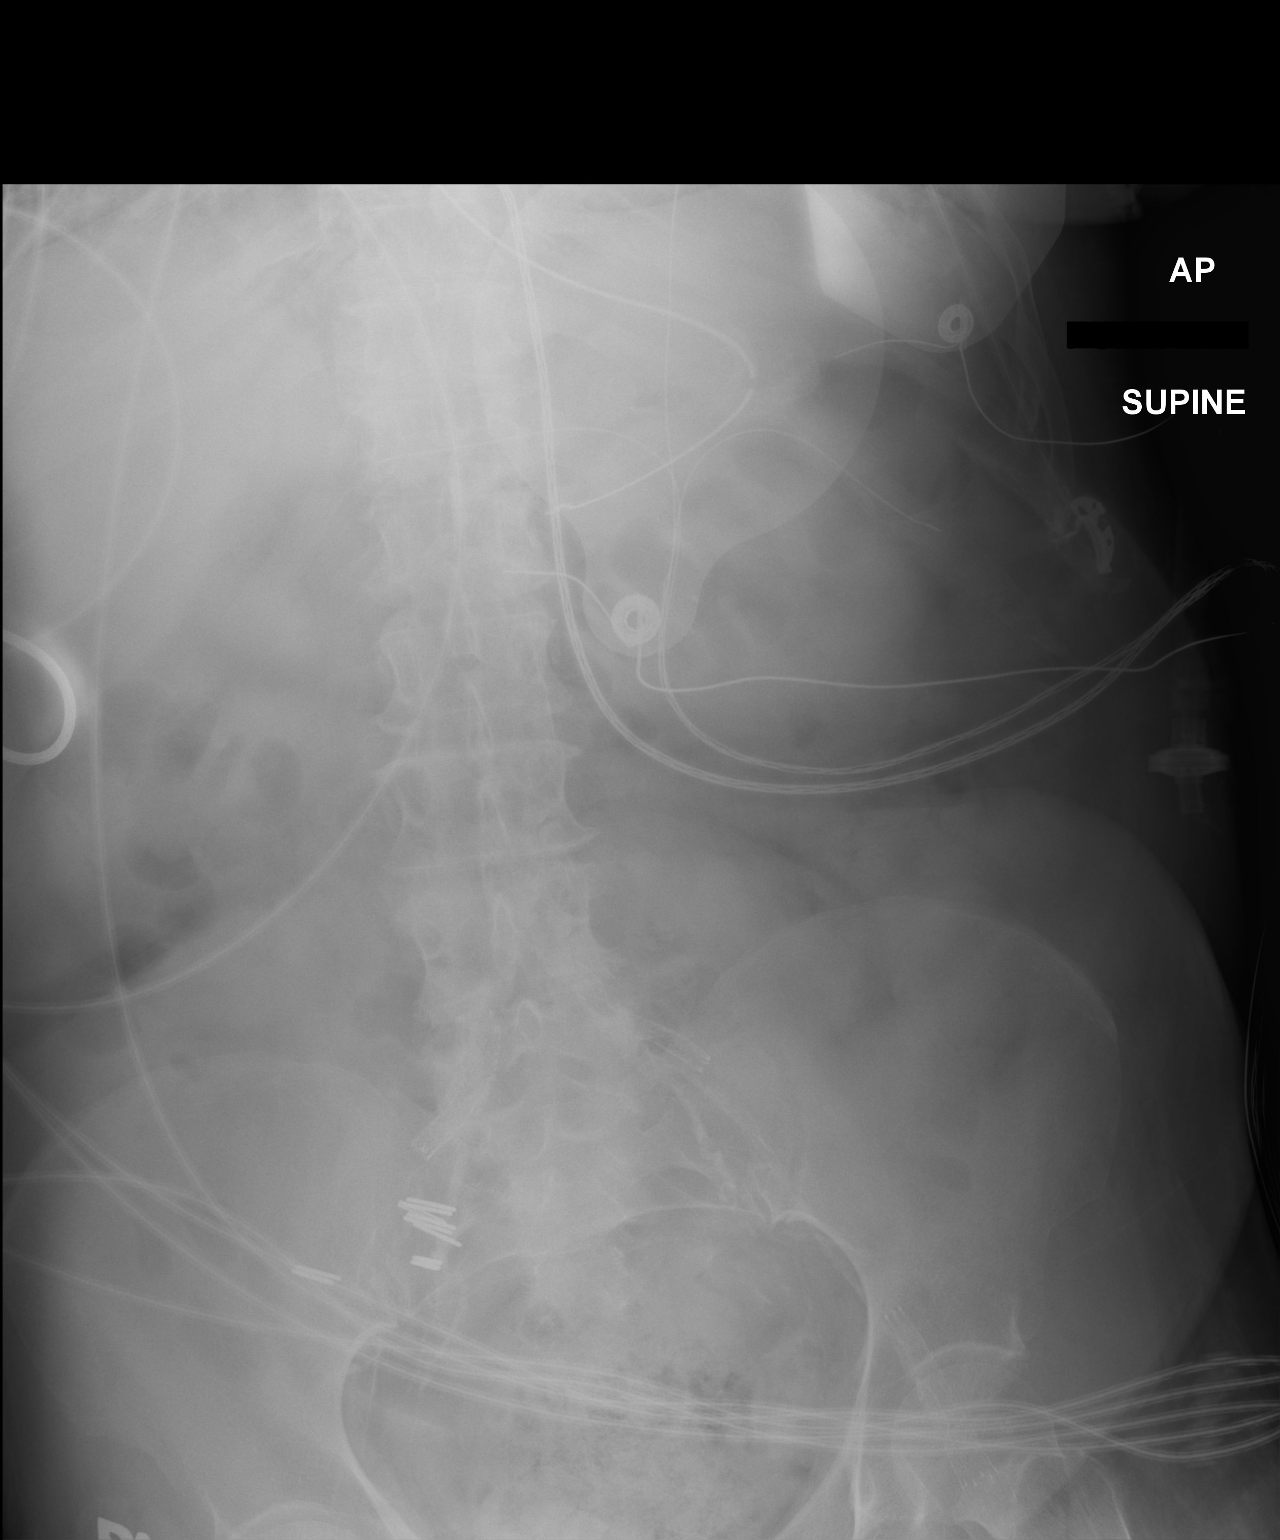

[1 of 1 positions shown; findings below may reference images not displayed]

FINDINGS: The bowel gas pattern is normal. Enteric catheter is seen overlying
the expected location of gastric body. Bilateral iliac vascular
stents are seen. Soft tissue details are obscured by a motion
artifact.
IMPRESSION: Nonobstructive bowel gas pattern.

Enteric catheter overlies the expected location of gastric body.

## 2016-11-17 IMAGING — CR DG CHEST 1V PORT
1 series · 1 of 1 positions shown · non-contrast
Comparison: 08/18/2015 chest radiograph.

CLINICAL DATA: Right central venous line placement

EXAM:
PORTABLE CHEST 1 VIEW

[AP]
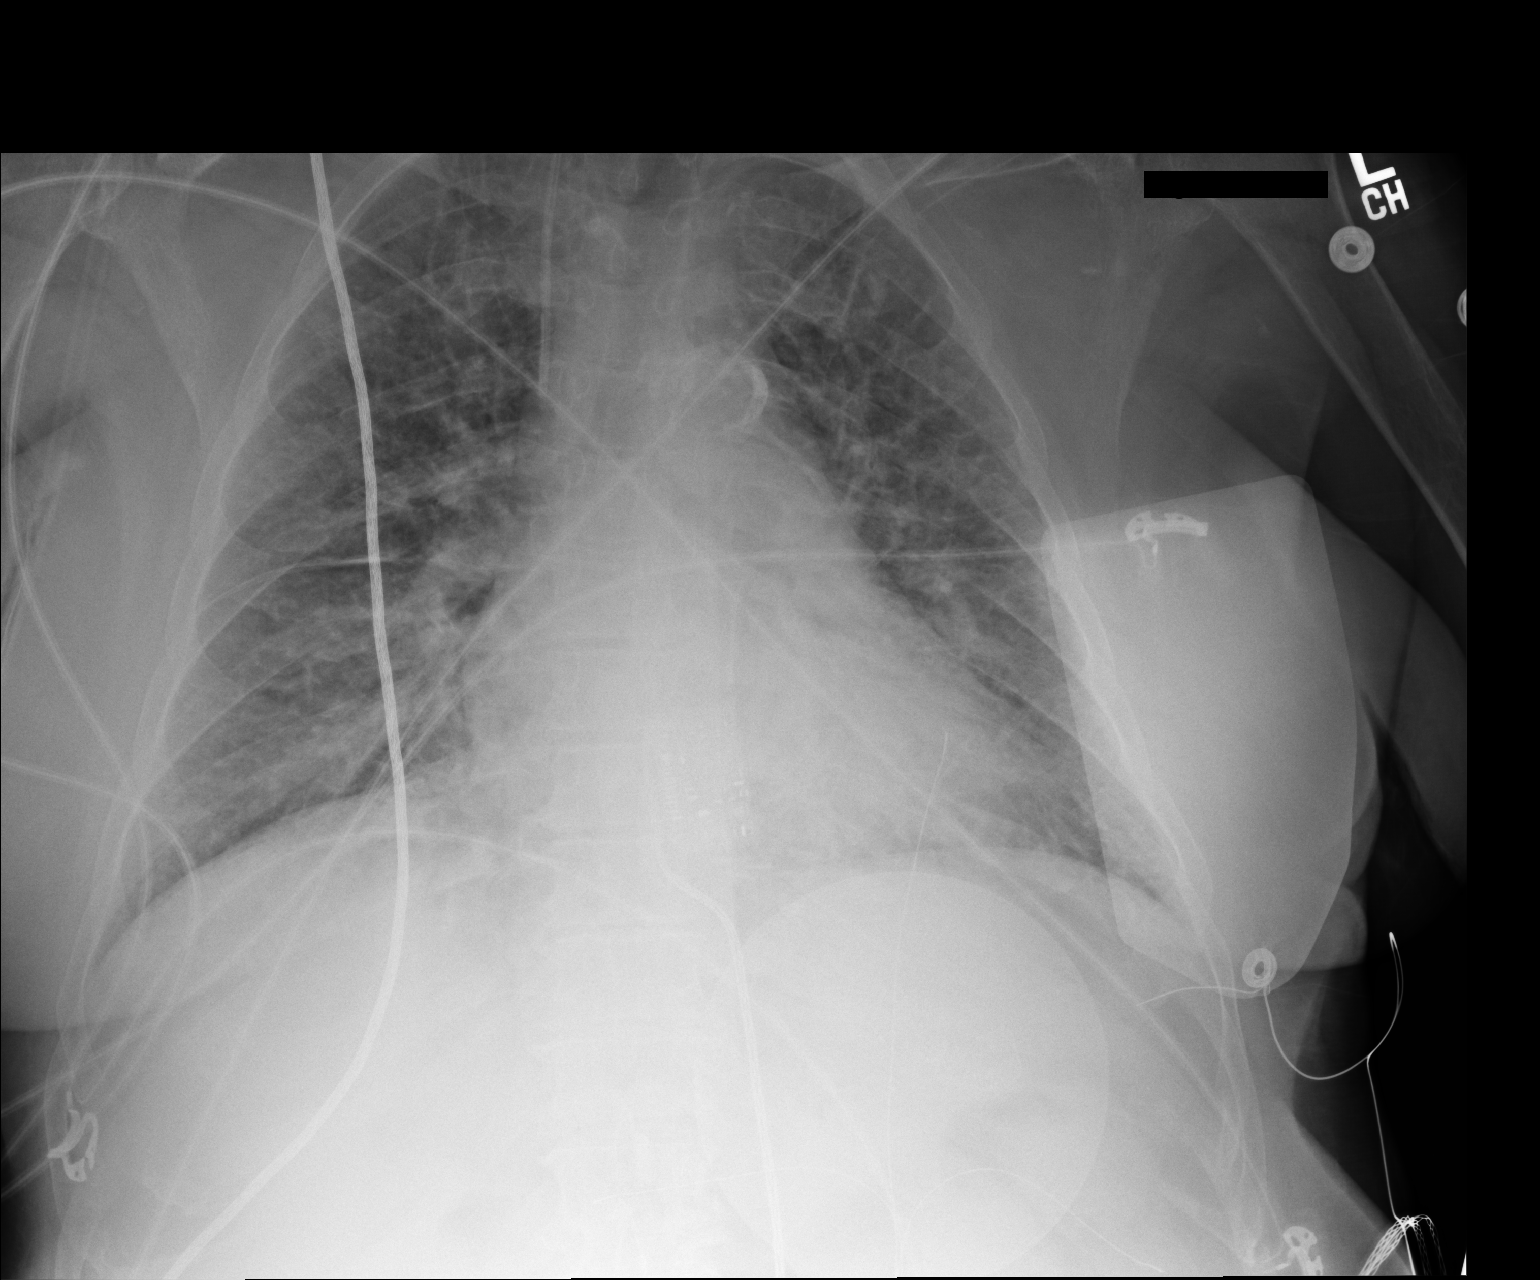

[1 of 1 positions shown; findings below may reference images not displayed]

FINDINGS: Right internal jugular central venous catheter terminates in the
upper third of the superior vena cava. Stable cardiomediastinal
silhouette with normal heart size with aortic atherosclerosis. No
pneumothorax. No pleural effusion. Mild diffuse prominence of the
central interstitial markings. No acute consolidative airspace
disease.
IMPRESSION: 1. Right internal jugular central venous catheter terminates in the
upper third of the superior vena cava. No pneumothorax.
2. Top-normal heart size. Prominence of the central interstitial
markings, favor vascular crowding due to low lung volumes .
3. Aortic atherosclerosis.

## 2016-11-17 IMAGING — CR DG CHEST 1V PORT SAME DAY
1 series · 1 of 1 positions shown · non-contrast
Comparison: 11/03/2015

CLINICAL DATA: Post intubation.

EXAM:
PORTABLE CHEST 1 VIEW

[AP]
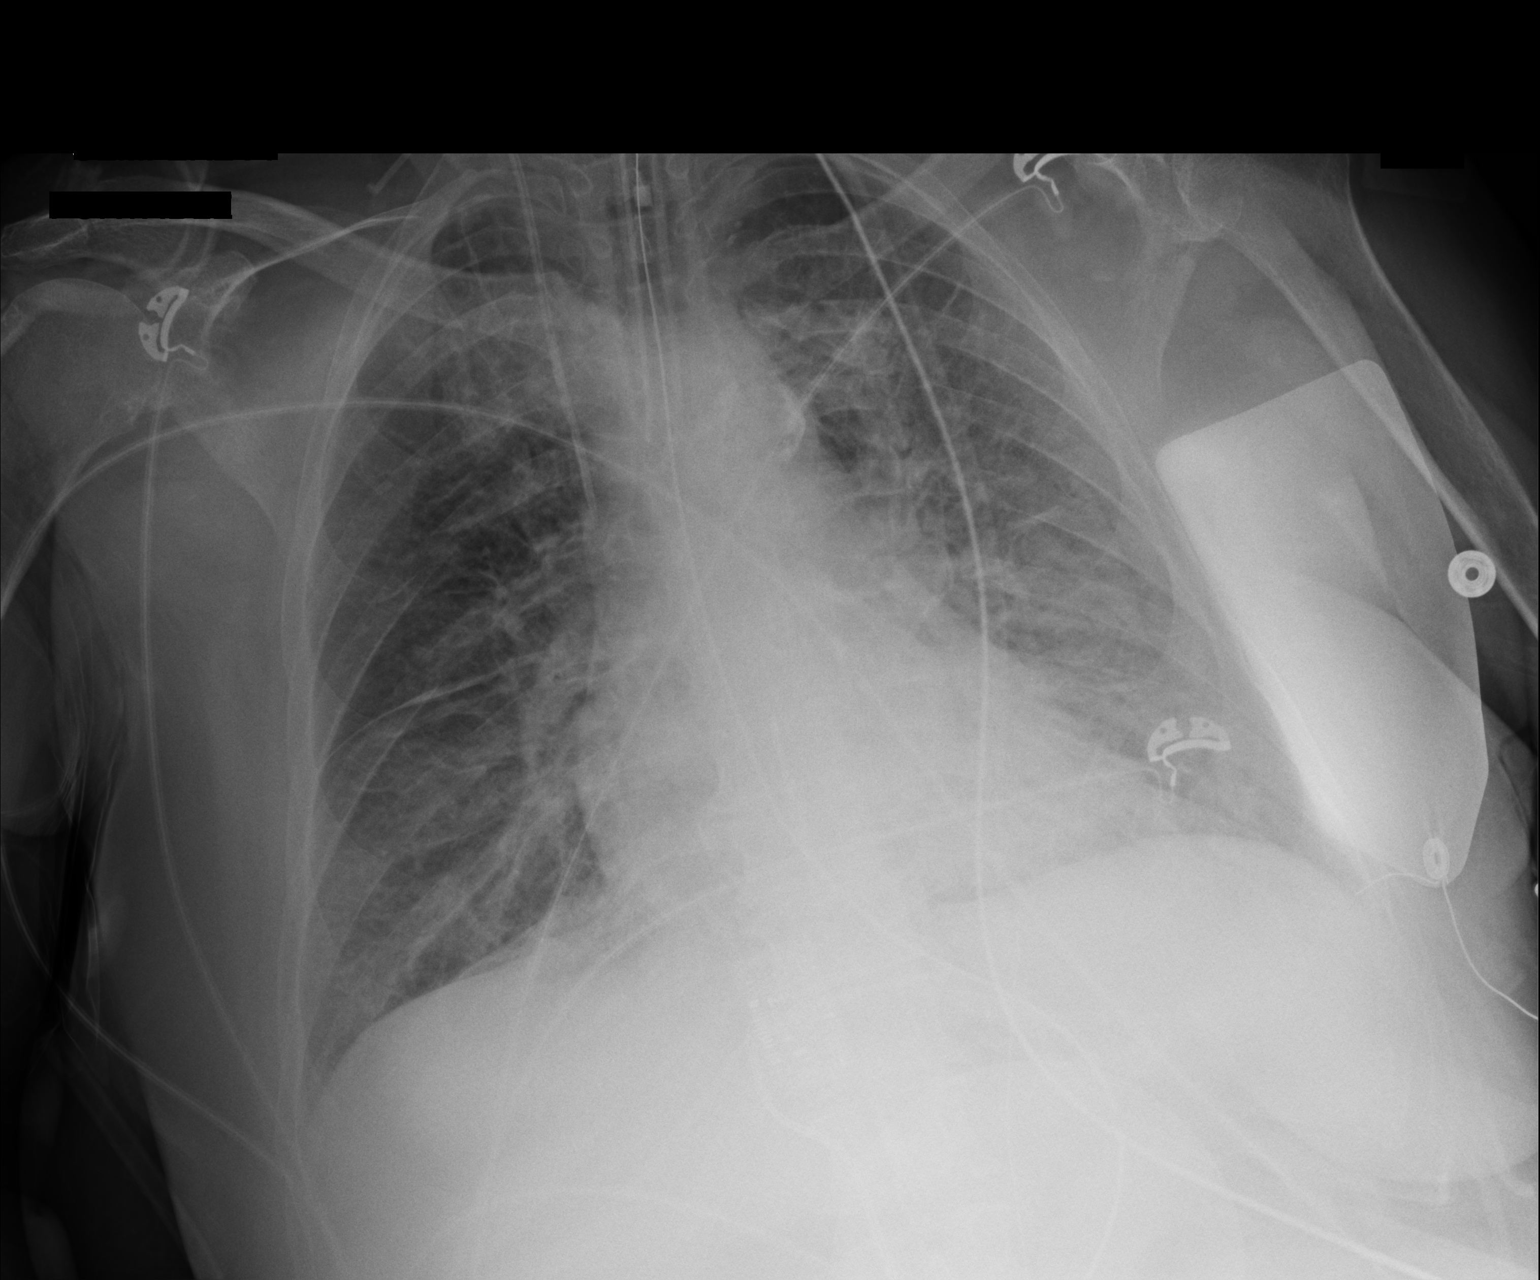

[1 of 1 positions shown; findings below may reference images not displayed]

FINDINGS: Patient is status post intubation. Endotracheal tube overlies the
tracheal air column and terminates 15 mm above the carina.
Retraction with approximately 1-2 cm may be considered. Enteric
catheter has been placed, tip collimated off the image. Right IJ
approach central venous catheter is stable.

Cardiomediastinal silhouette is normal. Mediastinal contours appear
intact. Calcific atherosclerotic disease of the aorta seen.

There is no evidence of pneumothorax. Low lung volumes with
interstitial pulmonary edema.

Osseous structures are without acute abnormality. Soft tissues are
grossly normal.
IMPRESSION: Status post intubation. Slight retraction of the endotracheal tube
may be considered.

Interstitial pulmonary edema.

## 2016-11-19 IMAGING — CR DG CHEST 1V PORT
1 series · 1 of 1 positions shown · non-contrast
Comparison: November 03, 2015

CLINICAL DATA: Hypoxia

EXAM:
PORTABLE CHEST 1 VIEW

[AP]
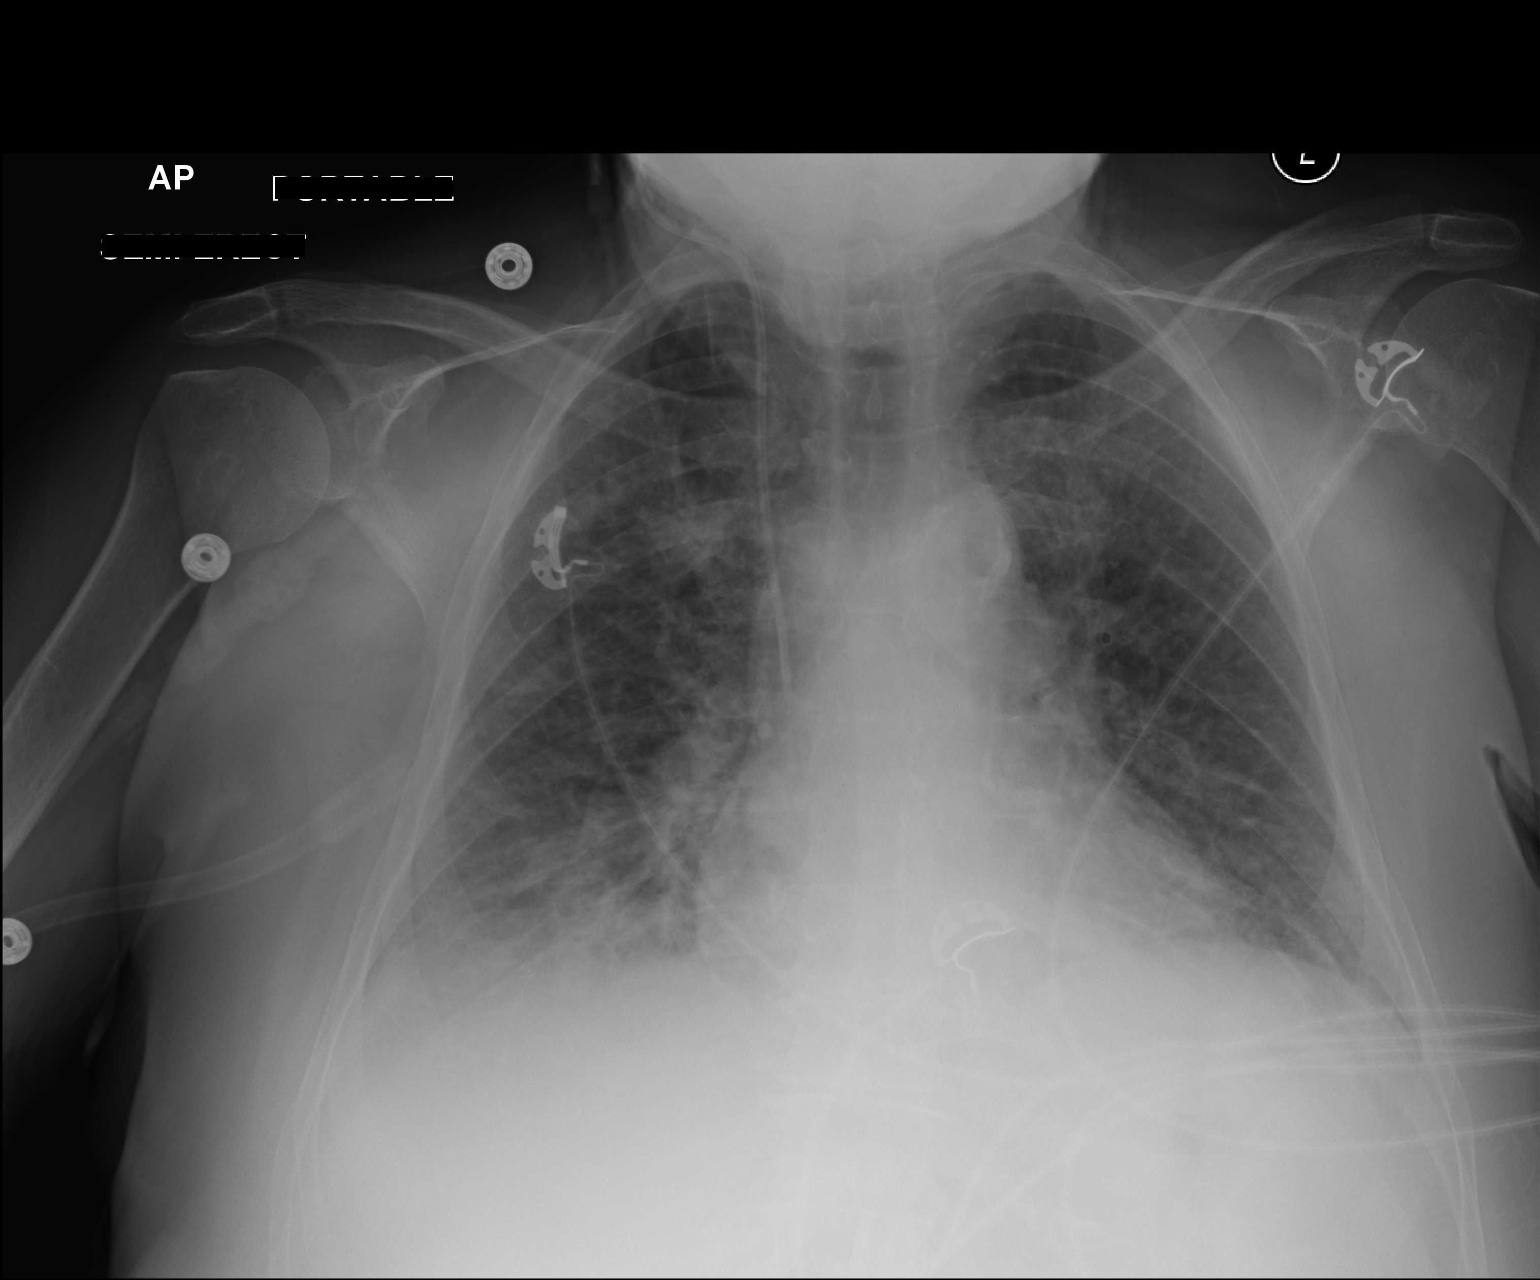

[1 of 1 positions shown; findings below may reference images not displayed]

FINDINGS: Endotracheal tube and nasogastric tube have been removed. Central
catheter tip is in the superior vena cava. No pneumothorax. There is
a focal area of consolidation in the right upper lobe medially.
There is right base atelectasis. Lungs elsewhere clear. Heart is
upper normal in size with pulmonary vascularity within normal
limits. There is atherosclerotic calcification in the aorta. No
adenopathy.
IMPRESSION: Small area of airspace consolidation in the right upper lobe
medially, likely infiltrate. Mild right base atelectasis. Lungs
elsewhere clear. Stable cardiac silhouette. Aortic atherosclerosis.
No pneumothorax.

## 2016-11-25 ENCOUNTER — Non-Acute Institutional Stay (SKILLED_NURSING_FACILITY): Payer: Medicare Other | Admitting: Internal Medicine

## 2016-11-25 ENCOUNTER — Encounter: Payer: Self-pay | Admitting: Internal Medicine

## 2016-11-25 DIAGNOSIS — I5032 Chronic diastolic (congestive) heart failure: Secondary | ICD-10-CM

## 2016-11-25 DIAGNOSIS — I96 Gangrene, not elsewhere classified: Secondary | ICD-10-CM

## 2016-11-25 DIAGNOSIS — I739 Peripheral vascular disease, unspecified: Secondary | ICD-10-CM | POA: Diagnosis not present

## 2016-11-25 DIAGNOSIS — I110 Hypertensive heart disease with heart failure: Secondary | ICD-10-CM

## 2016-11-25 DIAGNOSIS — J9601 Acute respiratory failure with hypoxia: Secondary | ICD-10-CM | POA: Diagnosis not present

## 2016-11-25 DIAGNOSIS — E785 Hyperlipidemia, unspecified: Secondary | ICD-10-CM

## 2016-11-25 DIAGNOSIS — Z95828 Presence of other vascular implants and grafts: Secondary | ICD-10-CM | POA: Diagnosis not present

## 2016-11-25 DIAGNOSIS — E1142 Type 2 diabetes mellitus with diabetic polyneuropathy: Secondary | ICD-10-CM

## 2016-11-25 DIAGNOSIS — K219 Gastro-esophageal reflux disease without esophagitis: Secondary | ICD-10-CM | POA: Diagnosis not present

## 2016-11-25 DIAGNOSIS — D62 Acute posthemorrhagic anemia: Secondary | ICD-10-CM

## 2016-11-25 NOTE — Progress Notes (Signed)
[facility]  [facility] Room Number: 505 Place of Service:  SNF [phone]  Provider: Randon GoldsmithAnne D. Lyn HollingsheadAlexander, MD  PCP: Georgann HousekeeperHusain, Karrar, MD Patient Care Team: Georgann HousekeeperHusain, Karrar, MD as PCP - General (Internal Medicine) Runell GessBerry, Jonathan J, MD as Consulting Physician (Cardiology) Nadara Mustarduda, Marcus V, MD as Consulting Physician (Orthopedic Surgery)  Extended Emergency Contact Information Primary Emergency Contact: Elizebeth KollerLamb,[name]  [place] Home Phone: [phone] Mobile Phone: [phone] Relation: Daughter  Allergies  Allergen Reactions  . Doxycycline Other (See Comments)    lethargy  . Hydrochlorothiazide Other (See Comments)    lethargic   . Latex Rash  . Penicillins Swelling and Rash    Pt states she has tolerated Keflex in the past without problems. States she may have tolerated Augmentin in the past but it caused GI upset. Has patient had a PCN reaction causing immediate rash, facial/tongue/throat swelling, SOB or lightheadedness with hypotension: Yes Has patient had a PCN reaction causing severe rash involving mucus membranes or skin necrosis: No Has patient had a PCN reaction that required hospitalization No Has patient had a PCN reaction occurring within the last 10 years: No    Chief Complaint  Patient presents with  . Discharge Note    discharge from SNF to home    HPI:  66 y.o. female  ith a long history of bilateral lower extremity ischemia and claudication, 09/2014 status post PTCA, history of right femoral-popliteal bypass in 2017 who was [facility] from 9[facility]20[facility]10/1 for a right common femoral artery endarterectomy, thrombectomy of femoral artery and right femoral to below the knee popliteal artery bypass and ray amputation of first second and third right toes secondary to gangrene.patient's initial surgery was canceled secondary to respiratory issues with hypoxia. Patient was placed on Maxipime and vancomycin and respiratory  status recovered and patiently was subsequently bypassed with no problems. Patient did receive 1 unit of packed RBCs. She was admitted to skilled nursing facility for OT/PT and is now ready to be discharged to home.    Past Medical History:  Diagnosis Date  . Anemia 10/2015   Acute Blood Loss  . Arthritis    "feel like I have it all over" (08/28/2015)  . CHF (congestive heart failure) (HCC)   . Complication of anesthesia    DIFFICULT WAKING "only when I was smoking; no problems since I quit"  . Coronary artery disease   . Family history of adverse reaction to anesthesia    sister slow to wake up  . GERD (gastroesophageal reflux disease)    takes Protonix daily   . Hip bursitis   . History of blood transfusion    10/2015  . Hyperlipidemia LDL goal < 70 06/28/2013   takes Atorvastatin daily  . Hypertension    takes Metoprolol and Imdur daily  . Malnutrition (HCC)   . Migraine    "none in a long time" (08/28/2015)  . Myocardial infarction (HCC) 2011  . Neuromuscular disorder (HCC)    DIABETIC NEUROPATHY  . Osteomyelitis (HCC) 2017   Left foot  . PAD (peripheral artery disease) (HCC)   . Peripheral vascular disease (HCC)   . Respiratory failure (HCC) 10/2015   Acute Hypoxia- acute pulmonary edema 11/13/2015  . Septic shock (HCC) 10/2015  . Stroke (HCC)   . Type II diabetes mellitus (HCC)    takes Lantus nightly.Average fasting blood sugar runs 80-90  Type II    Past Surgical History:  Procedure Laterality Date  .  ABDOMINAL HYSTERECTOMY    . APPENDECTOMY    . CARDIAC CATHETERIZATION  10/13/2009   95% stenosis in the AV groove circumflex and 95% ostial stenosis in small OM3. A 3x64mm drug-eluting Promus stent inserted ito the circumflex. Dilatated with a 3.25x31mm noncompliant Quantum balloon within entire segment. The entire region was reduced to 0% and brisk TIMI3 flow.  . CAROTID DUPLEX  03/19/2011   Right ICA-demonstrates complete occlusion. Left ICA-demonstrates a small  amount of fibrous plaque.  Marland Kitchen CATARACT EXTRACTION W/ INTRAOCULAR LENS IMPLANT Right   . CESAREAN SECTION  1990  . CORONARY ANGIOPLASTY    . ILIAC ARTERY STENT Left 08/28/2015   common  . LEXISCAN MYOVIEW  10/25/2010   Moderate perfusion defect due to infarct/scar with mild perinfarct ischemia seen in the Basal Inferolateral, Basal Anterolateral, Mid Inferolateral, and Mid Anterolateral regions. Post-stress EF is 50%.  . OVARY SURGERY  1983?   "ruptured"  . PERIPHERAL VASCULAR ANGIOGRAM  01/26/2010   High-grade SFA disease: left greater than right. Left SFA would require fem-pop bypass grafting. Right SFA could be stented but might require Diamondback Orbital atherectomy.  Marland Kitchen PERIPHERAL VASCULAR ANGIOGRAM  02/23/2010   Stealth Predator orbital rotational atherectomy performed on SFA & Popliteal up to 90,000 RPM. Stenting using overlapping 5x137mm and 5x12mm Absolute Pro Nitinol self-expanding stents beginning just at the knee up to the mid SFA resulting in reduction of 90-95% calcified SFA & Popliteal stenosis to 0. Stenting performed on the distal common & proximal iliac artery with a 10x4 Absolute Pro- 70-0%.  Marland Kitchen PERIPHERAL VASCULAR ANGIOGRAM  06/17/2010   PTA performed to the right external iliac artery stent using a 5x100 balloon at 10 atmospheres. Stenting performed using a 6x18 Genesis on Opta balloon. Postdilatation with a 7x2 balloon resulting in a 95% "in-stent" stenosis to 0% residual.  . PERIPHERAL VASCULAR ANGIOGRAM  06/04/2011   Bilateral total SFAs not percutaneously addressable. Good canidate for femoropopliteal bypass grafting  . PERIPHERAL VASCULAR ANGIOGRAM  08/28/2015  . TRANSTHORACIC ECHOCARDIOGRAM  10/17/2009   EF 45-50%, moderate hypokinesis of the entire inferolateral myocardium, mild concentric hypertrophy and mild regurg of the mitral valva.     reports that she quit smoking about 7 years ago. Her smoking use included cigarettes. She has a 61.50 pack-year smoking history. she  has never used smokeless tobacco. She reports that she does not drink alcohol or use drugs. Social History   Socioeconomic History  . Marital status: Legally Separated    Spouse name: Not on file  . Number of children: Not on file  . Years of education: Not on file  . Highest education level: Not on file  Social Needs  . Financial resource strain: Not on file  . Food insecurity - worry: Not on file  . Food insecurity - inability: Not on file  . Transportation needs - medical: Not on file  . Transportation needs - non-medical: Not on file  Occupational History  . Occupation: retired Occupational hygienist  Tobacco Use  . Smoking status: Former Smoker    Packs/day: 1.50    Years: 41.00    Pack years: 61.50    Types: Cigarettes    Last attempt to quit: 10/12/2009    Years since quitting: 7.1  . Smokeless tobacco: Never Used  Substance and Sexual Activity  . Alcohol use: No  . Drug use: No  . Sexual activity: Not Currently    Birth control/protection: Surgical  Other Topics Concern  . Not on file  Social History  Narrative   Admitted to [facility]   Former Smoker - stopped 2011   Alcohol  None   Full code    Pertinent  Health Maintenance Due  Topic Date Due  . FOOT EXAM  03/27/1960  . OPHTHALMOLOGY EXAM  03/27/1960  . COLONOSCOPY  03/27/2000  . DEXA SCAN  03/28/2015  . HEMOGLOBIN A1C  05/02/2016  . INFLUENZA VACCINE  08/28/2016  . MAMMOGRAM  04/11/2017  . PNA vac Low Risk Adult  Completed    Medications: Allergies as of 11/25/2016      Reactions   Doxycycline Other (See Comments)   lethargy   Hydrochlorothiazide Other (See Comments)   lethargic    Latex Rash   Penicillins Swelling, Rash   Pt states she has tolerated Keflex in the past without problems. States she may have tolerated Augmentin in the past but it caused GI upset. Has patient had a PCN reaction causing immediate rash, facial/tongue/throat swelling, SOB or lightheadedness with hypotension:  Yes Has patient had a PCN reaction causing severe rash involving mucus membranes or skin necrosis: No Has patient had a PCN reaction that required hospitalization No Has patient had a PCN reaction occurring within the last 10 years: No      Medication List        Accurate as of 11/25/16 11:59 PM. Always use your most recent med list.          amLODipine 5 MG tablet Commonly known as:  NORVASC TAKE 1 TABLET(5 MG) BY MOUTH DAILY   aspirin EC 81 MG tablet Take 81 mg by mouth daily.   atorvastatin 80 MG tablet Commonly known as:  LIPITOR Take 80 mg by mouth. Take one tablet daily for hyperlipidemia   clopidogrel 75 MG tablet Commonly known as:  PLAVIX Take 75 mg by mouth. Take one tablet daily for PVD   furosemide 40 MG tablet Commonly known as:  LASIX Take 40 mg by mouth daily.   gabapentin 300 MG capsule Commonly known as:  NEURONTIN Take 300 mg by mouth 3 (three) times daily.   GLUCERNA Liqd Take 237 mLs by mouth. Drink one daily for poor appetite   isosorbide mononitrate 60 MG 24 hr tablet Commonly known as:  IMDUR 60 mg. Take 1 tablet by mouth along with 30 mg to equal 90 mg daily   isosorbide mononitrate 30 MG 24 hr tablet Commonly known as:  IMDUR 30 mg. Take 1 tablet along by mouth with 60 mg to equal 90 mg daily.   LEVEMIR FLEXTOUCH 100 UNIT/ML Pen Generic drug:  Insulin Detemir INJECT 30 UNITS INTO THE SKIN HS UTD. DISCONTINUE LANTUS   losartan 100 MG tablet Commonly known as:  COZAAR TAKE 1 TABLET BY MOUTH EVERY DAY   metFORMIN 1000 MG tablet Commonly known as:  GLUCOPHAGE Take 1,000 mg by mouth 2 (two) times daily with a meal.   metoprolol succinate 50 MG 24 hr tablet Commonly known as:  TOPROL-XL TAKE 1 AND 1/2 TABLET BY MOUTH DAILY   pantoprazole 40 MG tablet Commonly known as:  PROTONIX TAKE 1 TABLET BY MOUTH DAILY   potassium chloride 10 MEQ tablet Commonly known as:  K-DUR,KLOR-CON Take 10 mEq by mouth daily.   promethazine 25 MG  tablet Commonly known as:  PHENERGAN Take 25 mg by mouth. Take one tablet every 6 hours as needed for nausea/vomiting        Vitals:   11/25/16 0953  BP: 114/62  Pulse: 72  Resp: 18  Temp: 97.6 F (36.4 C)  Weight: 163 lb (73.9 kg)  Height: 5\' 4"  (1.626 m)   Body mass index is 27.98 kg/m.  Physical Exam  GENERAL APPEARANCE: Alert, conversant. No acute distress.  HEENT: Unremarkable. RESPIRATORY: Breathing is even, unlabored. Lung sounds are clear   CARDIOVASCULAR: Heart RRR no murmurs, rubs or gallops. No peripheral edema.  GASTROINTESTINAL: Abdomen is soft, non-tender, not distended w/ normal bowel sounds.  NEUROLOGIC: Cranial nerves 2-12 grossly intact. Moves all extremities   Labs reviewed: Basic Metabolic Panel: Recent Labs    10/19/16 0343  10/22/16 0318 10/22/16 1033 10/23/16 0426 10/25/16 0309  NA 134*   < > 137 139 137 136  K 5.1   < > 4.4 5.0 5.1 4.7  CL 106   < > 109  --  110 109  CO2 20*   < > 22  --  20* 22  GLUCOSE 170*   < > 98 147* 135* 160*  BUN 33*   < > 21*  --  20 25*  CREATININE 1.20*   < > 1.01*  --  0.96 1.09*  CALCIUM 8.6*   < > 8.3*  --  8.2* 8.1*  MG 1.6*  --   --   --   --   --   PHOS 3.6  --   --   --   --   --    < > = values in this interval not displayed.   No results found for: [facility] Liver Function Tests: No results for input(s): AST, ALT, ALKPHOS, BILITOT, PROT, ALBUMIN in the last 8760 hours. No results for input(s): LIPASE, AMYLASE in the last 8760 hours. No results for input(s): AMMONIA in the last 8760 hours. CBC: Recent Labs    01/02/16 1555  10/17/16 2230  10/23/16 0426 10/25/16 0309 10/26/16 0740  WBC 9.4   < > 21.1*   < > 12.6* 13.7* 12.6*  NEUTROABS 5,734  --  16.7*  --   --   --   --   HGB 11.2*   < > 10.5*   < > 7.8* 6.7* 8.8*  HCT 34.0*   < > 31.5*   < > 23.2* 20.5* 26.7*  MCV 85.6   < > 86.1   < > 83.8 86.1 87.3  PLT 293   < > 429*   < > 358 284 316   < > = values in this interval not displayed.     Lipid No results for input(s): CHOL, HDL, LDLCALC, TRIG in the last 8760 hours. Cardiac Enzymes: Recent Labs    10/17/16 2230 10/18/16 0355 10/18/16 0957  TROPONINI 0.03* 0.06* 0.04*   BNP: Recent Labs    10/17/16 2230  BNP 1,176.9*   CBG: Recent Labs    10/27/16 2045 10/28/16 0625 10/28/16 1115  GLUCAP 127* 116* 165*    Procedures and Imaging Studies During Stay: No results found.  Assessment/Plan:   S/P femoral-popliteal bypass surgery  Gangrene of right foot (HCC)  Acute blood loss as cause of postoperative anemia  Claudication in peripheral vascular disease:  Lifestyle limiting.  Hyperlipidemia with target LDL less than 70  DM type 2 with diabetic peripheral neuropathy (HCC)  Gastroesophageal reflux disease without esophagitis  Acute respiratory failure with hypoxia (HCC)  Hypertensive heart disease with chronic diastolic congestive heart failure (HCC)   Patient is being discharged with the following home health services: PT/OT/nursing   Patient is being discharged with the following durable medical  equipment:  None  Patient has been advised to f/u with their PCP in 1-2 weeks to bring them up to date on their rehab stay.  Social services at facility was responsible for arranging this appointment.  Pt was provided with a 30 day supply of prescriptions for medications and refills must be obtained from their PCP.  For controlled substances, a more limited supply may be provided adequate until PCP appointment only.  Medications have been reconciled.   Time spent greater than 30 minutes;> 50% of time with patient was spent reviewing records, labs, tests and studies, counseling and developing plan of care  Randon Goldsmith. Lyn Hollingshead, MD

## 2016-11-26 IMAGING — CR DG CHEST 1V PORT
1 series · 1 of 1 positions shown · non-contrast
Comparison: 11/05/2015

CLINICAL DATA: Increasing shortness of breath.

EXAM:
PORTABLE CHEST 1 VIEW

[AP]
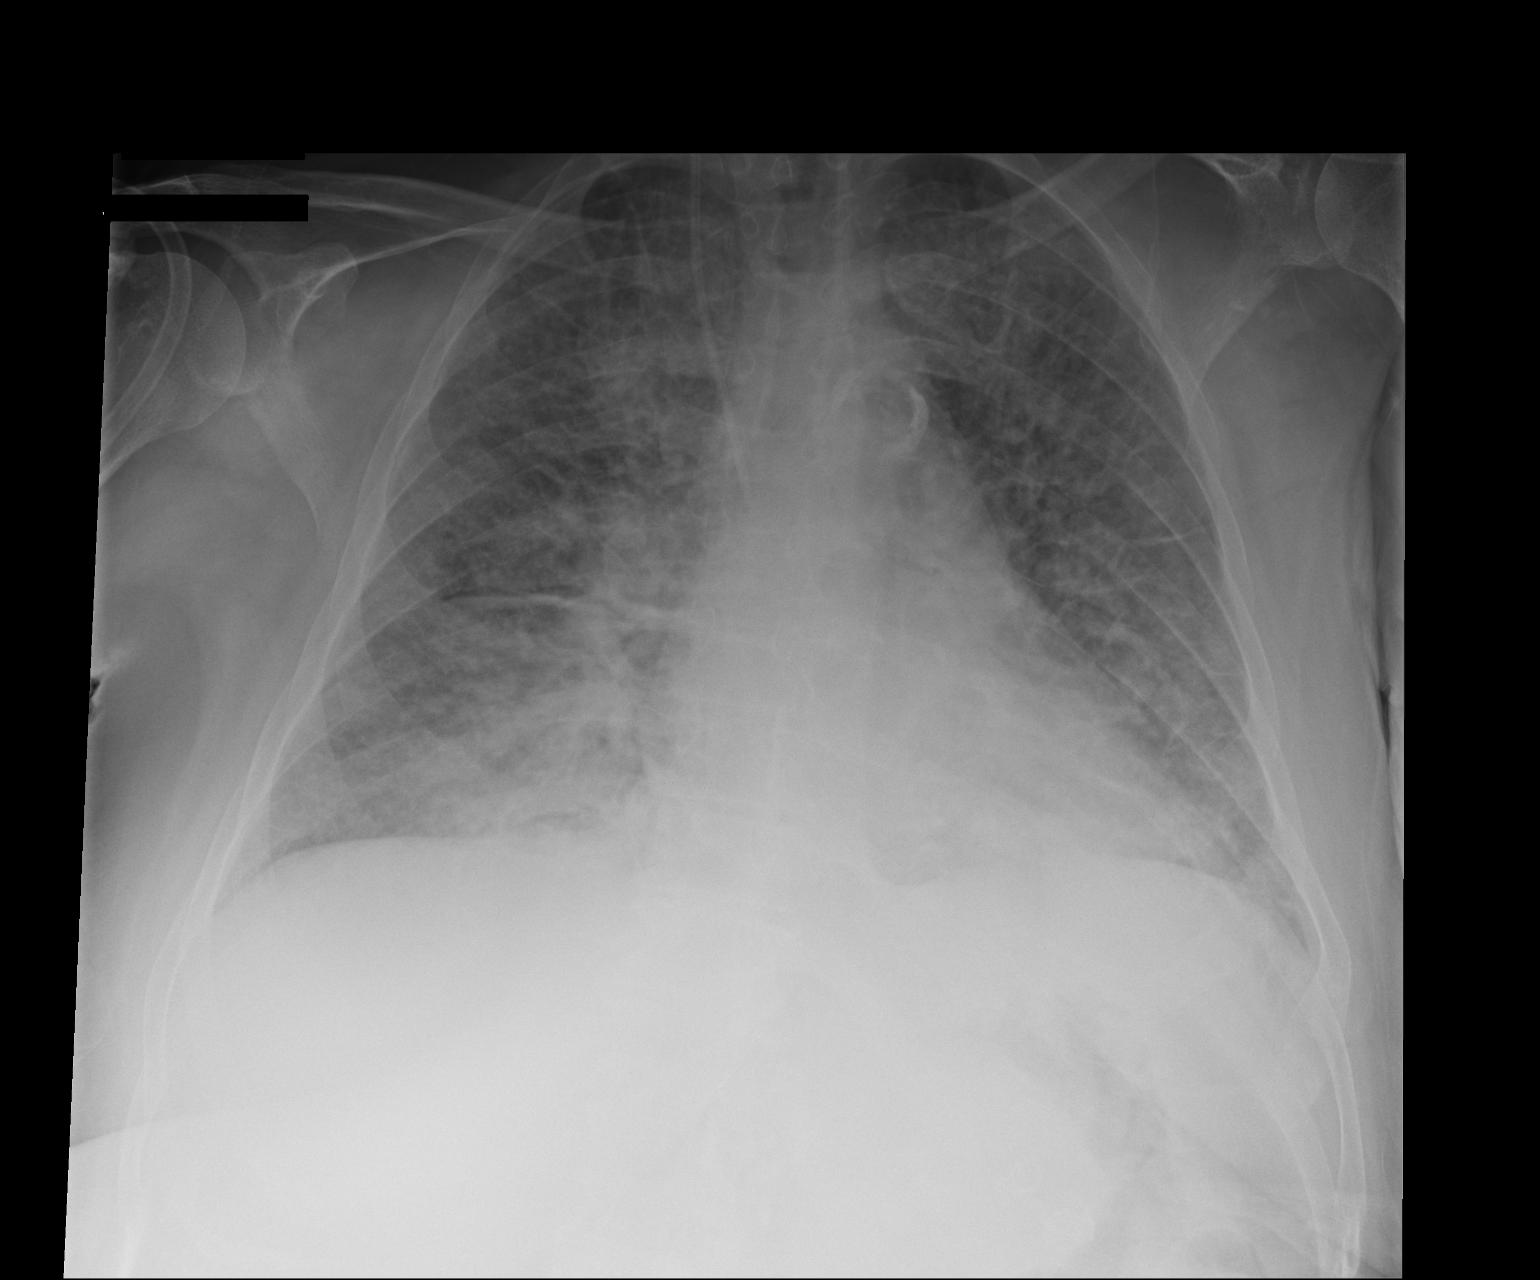

[1 of 1 positions shown; findings below may reference images not displayed]

FINDINGS: Cardiomegaly noted.

Increasing moderate to severe bilateral pulmonary edema noted.

A right IJ central venous catheter with tip overlying the lower SVC
again noted.

There may be trace bilateral pleural effusions present.

There is no evidence of pneumothorax.
IMPRESSION: Increasing moderate to severe pulmonary edema.

## 2016-11-27 ENCOUNTER — Telehealth: Payer: Self-pay | Admitting: *Deleted

## 2016-11-27 ENCOUNTER — Ambulatory Visit (INDEPENDENT_AMBULATORY_CARE_PROVIDER_SITE_OTHER): Payer: Self-pay | Admitting: Family

## 2016-11-27 ENCOUNTER — Encounter: Payer: Self-pay | Admitting: Family

## 2016-11-27 VITALS — BP 135/63 | HR 82 | Temp 97.9°F | Resp 16 | Ht 64.0 in | Wt 164.0 lb

## 2016-11-27 DIAGNOSIS — I70221 Atherosclerosis of native arteries of extremities with rest pain, right leg: Secondary | ICD-10-CM

## 2016-11-27 DIAGNOSIS — T8131XA Disruption of external operation (surgical) wound, not elsewhere classified, initial encounter: Secondary | ICD-10-CM

## 2016-11-27 DIAGNOSIS — Z95828 Presence of other vascular implants and grafts: Secondary | ICD-10-CM

## 2016-11-27 DIAGNOSIS — Z87891 Personal history of nicotine dependence: Secondary | ICD-10-CM

## 2016-11-27 NOTE — Telephone Encounter (Signed)
Patient called to report that her groin incision is open approximately 1 inch. She had Rt CFA and EIA endarterectomy with profundoplasty; Rt fem- BK pop BP with non-reversed Saphenous vein graft on 10-22-16 by Dr. Edilia Bo. Pt was recently discharged from 4 weeks at SNF. Will see her at 3:45 pm today with our NP for wound check.

## 2016-11-27 NOTE — Progress Notes (Signed)
Postoperative Visit   History of Present Illness  [name] [name] is a 66 y.o. year old female who is s/p right femoral and external iliac artery endarterectomy with profundoplasty and right femoral to below-knee popliteal artery bypass with vein in addition to ray amputation of the right first second and third toes on 10/22/2016 by Dr. Edilia Boickson.   She had been at the skilled nursing facility until recently. She had some swelling in the right leg as expected. She had completed a course of Cipro when she saw Dr. Edilia Boickson on 11-13-16. At that time the patient was doing well status post extensive bypass in the right leg and toe amputations. At that point, the most important thing was aggressive wound care at the skilled nursing facility. Dr. Edilia Boickson instructed them to wash the groin daily with soap and water and to keep dry gauze in the groin, and also instructed them to elevate her leg to get the swelling down which will facilitate healing of her toe indications sites. She has excellent perfusion and there is no reason not to fully elevate her leg as I have instructed her. Will see her back in 2 weeks and we might potentially be able to get some or all of her sutures out of her right foot. She will need a follow up visit 3 months postop for graft duplex and ABIs.  She has a follow up appointment with Dr. Edilia Boickson scheduled on 12-04-16.   Patient called today to report that her groin incision is open approximately 1 inch which pt first noted today. She had Rt CFA and EIA endarterectomy with profundoplasty; Rt fem- BK pop BP with non-reversed Saphenous vein graft on 10-22-16 by Dr. Edilia Boickson. Pt was recently discharged from 4 weeks at SNF. She has been washing right groin daily with liquid antibacterial soap and warm water.  Pt denies fever or chills. Pt states her right foot incision is healing well, is wrapped in kerlex and ace wrap dressing.  He right leg incisions have completely healed.  She is wearing  post op shoes on both feet.  Pt states HH nurse will start visiting tomorrow.    For VQI Use Only  PRE-ADM LIVING: Home  AMB STATUS: in w/c   Past Medical History:  Diagnosis Date  . Anemia 10/2015   Acute Blood Loss  . Arthritis    "feel like I have it all over" (08/28/2015)  . CHF (congestive heart failure) (HCC)   . Complication of anesthesia    DIFFICULT WAKING "only when I was smoking; no problems since I quit"  . Coronary artery disease   . Family history of adverse reaction to anesthesia    sister slow to wake up  . GERD (gastroesophageal reflux disease)    takes Protonix daily   . Hip bursitis   . History of blood transfusion    10/2015  . Hyperlipidemia LDL goal < 70 06/28/2013   takes Atorvastatin daily  . Hypertension    takes Metoprolol and Imdur daily  . Malnutrition (HCC)   . Migraine    "none in a long time" (08/28/2015)  . Myocardial infarction (HCC) 2011  . Neuromuscular disorder (HCC)    DIABETIC NEUROPATHY  . Osteomyelitis (HCC) 2017   Left foot  . PAD (peripheral artery disease) (HCC)   . Peripheral vascular disease (HCC)   . Respiratory failure (HCC) 10/2015   Acute Hypoxia- acute pulmonary edema 11/13/2015  . Septic shock (HCC) 10/2015  . Stroke (HCC)   .  Type II diabetes mellitus (HCC)    takes Lantus nightly.Average fasting blood sugar runs 80-90  Type II    Past Surgical History:  Procedure Laterality Date  . ABDOMINAL HYSTERECTOMY    . AMPUTATION Right 10/22/2016   Procedure: AMPUTATION DIGIT RIGHT TOES 1-3 POSSIBLE TRANSMETATARSAL;  Surgeon: Chuck Hint, MD;  Location: [facility];  Service: Vascular;  Laterality: Right;  . APPENDECTOMY    . ATHERECTOMY N/A 06/04/2011   Procedure: ATHERECTOMY;  Surgeon: Runell Gess, MD;  Location: [facility] CATH LAB;  Service: Cardiovascular;  Laterality: N/A;  . CARDIAC CATHETERIZATION  10/13/2009   95% stenosis in the AV groove circumflex and 95% ostial stenosis in small OM3. A 3x20mm drug-eluting  Promus stent inserted ito the circumflex. Dilatated with a 3.25x40mm noncompliant Quantum balloon within entire segment. The entire region was reduced to 0% and brisk TIMI3 flow.  . CAROTID DUPLEX  03/19/2011   Right ICA-demonstrates complete occlusion. Left ICA-demonstrates a small amount of fibrous plaque.  Marland Kitchen CATARACT EXTRACTION W/ INTRAOCULAR LENS IMPLANT Right   . CESAREAN SECTION  1990  . CORONARY ANGIOPLASTY    . ENDARTERECTOMY FEMORAL Left 09/05/2015   Procedure: ENDARTERECTOMY FEMORAL WITH PROFUNDOPLASTY;  Surgeon: Chuck Hint, MD;  Location: [facility] OR;  Service: Vascular;  Laterality: Left;  Left common femoral artery vein patch using left saphenous vien  . ENDARTERECTOMY FEMORAL Right 10/22/2016   Procedure: ENDARTERECTOMY RIGHT COMMON FEMORAL;  Surgeon: Chuck Hint, MD;  Location: [facility] OR;  Service: Vascular;  Laterality: Right;  . FEMORAL-POPLITEAL BYPASS GRAFT Left 11/02/2015   Procedure: BYPASS GRAFT FEMORAL-POPLITEAL ARTERY VS FEMORAL-TIBIAL ARTERY BYPASS;  Surgeon: Chuck Hint, MD;  Location: [facility] OR;  Service: Vascular;  Laterality: Left;  . FEMORAL-POPLITEAL BYPASS GRAFT Right 10/22/2016   Procedure: BYPASS GRAFT RIGHT FEMORAL- BELOW KNEE POPLITEAL ARTERY WITH VEIN;  Surgeon: Chuck Hint, MD;  Location: [facility] OR;  Service: Vascular;  Laterality: Right;  . I&D EXTREMITY Left 11/10/2015   Procedure: Debridement Left Foot Ulcer, Application  Wound VAC;  Surgeon: [name], MD;  Location: [facility] OR;  Service: Orthopedics;  Laterality: Left;  . ILIAC ARTERY STENT Left 08/28/2015   common  . INTRAOPERATIVE ARTERIOGRAM Left 09/05/2015   Procedure: INTRA OPERATIVE ARTERIOGRAM;  Surgeon: Chuck Hint, MD;  Location: [facility] OR;  Service: Vascular;  Laterality: Left;  . INTRAOPERATIVE ARTERIOGRAM Left 11/02/2015   Procedure: INTRA OPERATIVE ARTERIOGRAM;  Surgeon: Chuck Hint, MD;  Location: [facility] OR;  Service: Vascular;  Laterality: Left;  .  INTRAOPERATIVE ARTERIOGRAM Right 10/22/2016   Procedure: INTRA OPERATIVE ARTERIOGRAM;  Surgeon: Chuck Hint, MD;  Location: [facility] OR;  Service: Vascular;  Laterality: Right;  . LEXISCAN MYOVIEW  10/25/2010   Moderate perfusion defect due to infarct/scar with mild perinfarct ischemia seen in the Basal Inferolateral, Basal Anterolateral, Mid Inferolateral, and Mid Anterolateral regions. Post-stress EF is 50%.  . LOWER EXTREMITY ANGIOGRAPHY N/A 10/17/2016   Procedure: Lower Extremity Angiography;  Surgeon: Runell Gess, MD;  Location: [facility] INVASIVE CV LAB;  Service: Cardiovascular;  Laterality: N/A;  . OVARY SURGERY  1983?   "ruptured"  . PERIPHERAL VASCULAR ANGIOGRAM  01/26/2010   High-grade SFA disease: left greater than right. Left SFA would require fem-pop bypass grafting. Right SFA could be stented but might require Diamondback Orbital atherectomy.  Marland Kitchen PERIPHERAL VASCULAR ANGIOGRAM  02/23/2010   Stealth Predator orbital rotational atherectomy performed on SFA & Popliteal up to 90,000 RPM. Stenting using overlapping 5x110mm and 5x53mm Absolute Pro Nitinol  self-expanding stents beginning just at the knee up to the mid SFA resulting in reduction of 90-95% calcified SFA & Popliteal stenosis to 0. Stenting performed on the distal common & proximal iliac artery with a 10x4 Absolute Pro- 70-0%.  Marland Kitchen PERIPHERAL VASCULAR ANGIOGRAM  06/17/2010   PTA performed to the right external iliac artery stent using a 5x100 balloon at 10 atmospheres. Stenting performed using a 6x18 Genesis on Opta balloon. Postdilatation with a 7x2 balloon resulting in a 95% "in-stent" stenosis to 0% residual.  . PERIPHERAL VASCULAR ANGIOGRAM  06/04/2011   Bilateral total SFAs not percutaneously addressable. Good canidate for femoropopliteal bypass grafting  . PERIPHERAL VASCULAR ANGIOGRAM  08/28/2015  . PERIPHERAL VASCULAR CATHETERIZATION N/A 08/28/2015   Procedure: Lower Extremity Angiography;  Surgeon: Runell Gess, MD;   Location: [facility] INVASIVE CV LAB;  Service: Cardiovascular;  Laterality: N/A;  . PERIPHERAL VASCULAR CATHETERIZATION N/A 08/28/2015   Procedure: Abdominal Aortogram;  Surgeon: Runell Gess, MD;  Location: [facility] INVASIVE CV LAB;  Service: Cardiovascular;  Laterality: N/A;  . PERIPHERAL VASCULAR CATHETERIZATION Left 08/28/2015   Procedure: Peripheral Vascular Intervention;  Surgeon: Runell Gess, MD;  Location: [facility] INVASIVE CV LAB;  Service: Cardiovascular;  Laterality: Left;  common iliac  . PERIPHERAL VASCULAR CATHETERIZATION Left 08/28/2015   Procedure: Peripheral Vascular Atherectomy;  Surgeon: Runell Gess, MD;  Location: [facility] INVASIVE CV LAB;  Service: Cardiovascular;  Laterality: Left;  common iliac  . PERIPHERAL VASCULAR CATHETERIZATION N/A 09/28/2015   Procedure: Lower Extremity Angiography;  Surgeon: Runell Gess, MD;  Location: [facility] INVASIVE CV LAB;  Service: Cardiovascular;  Laterality: N/A;  . PERIPHERAL VASCULAR CATHETERIZATION Left 09/28/2015   Procedure: Peripheral Vascular Intervention;  Surgeon: Runell Gess, MD;  Location: [facility] INVASIVE CV LAB;  Service: Cardiovascular;  Laterality: Left CFA  PCI with 9 mm x 4 cm Abbott nitinol absolute Pro self-expanding stent     . SKIN SPLIT GRAFT Left 12/01/2015   Procedure: LEFT FOOT SKIN GRAFT AND VAC;  Surgeon: [name], MD;  Location: [facility] OR;  Service: Orthopedics;  Laterality: Left;  . THROMBECTOMY FEMORAL ARTERY Right 10/22/2016   Procedure: THROMBECTOMY FEMORAL ARTERY;  Surgeon: Chuck Hint, MD;  Location: [facility] OR;  Service: Vascular;  Laterality: Right;  . TRANSTHORACIC ECHOCARDIOGRAM  10/17/2009   EF 45-50%, moderate hypokinesis of the entire inferolateral myocardium, mild concentric hypertrophy and mild regurg of the mitral valva.  Marland Kitchen VEIN HARVEST Left 11/02/2015   Procedure: LEFT GREATER SAPHENOUS VEIN HARVEST;  Surgeon: Chuck Hint, MD;  Location: [facility] OR;  Service: Vascular;  Laterality: Left;    Social History     Social History  . Marital status: Legally Separated    Spouse name: N/A  . Number of children: N/A  . Years of education: N/A   Occupational History  . retired Occupational hygienist    Social History Main Topics  . Smoking status: Former Smoker    Packs/day: 1.50    Years: 41.00    Types: Cigarettes    Quit date: 10/12/2009  . Smokeless tobacco: Never Used  . Alcohol use No  . Drug use: No  . Sexual activity: Not Currently    Birth control/ protection: Surgical   Other Topics Concern  . Not on file   Social History Narrative   Admitted to [facility]   Former Smoker - stopped 2011   Alcohol  None   Full code       Allergies  Allergen  Reactions  . Doxycycline Other (See Comments)    lethargy  . Hydrochlorothiazide Other (See Comments)    lethargic   . Latex Rash  . Penicillins Swelling and Rash    Pt states she has tolerated Keflex in the past without problems. States she may have tolerated Augmentin in the past but it caused GI upset. Has patient had a PCN reaction causing immediate rash, facial/tongue/throat swelling, SOB or lightheadedness with hypotension: Yes Has patient had a PCN reaction causing severe rash involving mucus membranes or skin necrosis: No Has patient had a PCN reaction that required hospitalization No Has patient had a PCN reaction occurring within the last 10 years: No    Current Outpatient Prescriptions on File Prior to Visit  Medication Sig Dispense Refill  . amLODipine (NORVASC) 5 MG tablet TAKE 1 TABLET(5 MG) BY MOUTH DAILY 30 tablet 0  . aspirin EC 81 MG tablet Take 81 mg by mouth daily.    Marland Kitchen atorvastatin (LIPITOR) 80 MG tablet Take 80 mg by mouth. Take one tablet daily for hyperlipidemia    . clopidogrel (PLAVIX) 75 MG tablet Take 75 mg by mouth. Take one tablet daily for PVD    . furosemide (LASIX) 40 MG tablet Take 40 mg by mouth daily.     Marland Kitchen gabapentin (NEURONTIN) 300 MG capsule Take 300 mg by mouth 3 (three) times daily.      Marland Kitchen GLUCERNA (GLUCERNA) LIQD Take 237 mLs by mouth. Drink one daily for poor appetite    . isosorbide mononitrate (IMDUR) 30 MG 24 hr tablet 30 mg. Take 1 tablet along by mouth with 60 mg to equal 90 mg daily.    . isosorbide mononitrate (IMDUR) 60 MG 24 hr tablet 60 mg. Take 1 tablet by mouth along with 30 mg to equal 90 mg daily    . LEVEMIR FLEXTOUCH 100 UNIT/ML Pen INJECT 30 UNITS INTO THE SKIN HS UTD. DISCONTINUE LANTUS  3  . losartan (COZAAR) 100 MG tablet TAKE 1 TABLET BY MOUTH EVERY DAY 90 tablet 1  . metFORMIN (GLUCOPHAGE) 1000 MG tablet Take 1,000 mg by mouth 2 (two) times daily with a meal.    . metoprolol succinate (TOPROL-XL) 50 MG 24 hr tablet TAKE 1 AND 1/2 TABLET BY MOUTH DAILY 45 tablet 5  . pantoprazole (PROTONIX) 40 MG tablet TAKE 1 TABLET BY MOUTH DAILY 90 tablet 3  . potassium chloride (K-DUR,KLOR-CON) 10 MEQ tablet Take 10 mEq by mouth daily.     . promethazine (PHENERGAN) 25 MG tablet Take 25 mg by mouth. Take one tablet every 6 hours as needed for nausea/vomiting     No current facility-administered medications on file prior to visit.       Physical Examination  Vitals:   11/27/16 1551  BP: 135/63  Pulse: 82  Resp: 16  Temp: 97.9 F (36.6 C)  SpO2: 99%  Weight: 164 lb (74.4 kg)  Height: 5\' 4"  (1.626 m)   Body mass index is 28.15 kg/m.   Right groin incision has dehisced, no odor, scant clear watery drainage. Incision depth is about 1.5 cm, red beefy tissue at base, mild erythema at incision edges, evidence of mild fat necrosis.  Right foot with faintly palpable DP pulse.    Medical Decision Making  [name] [name] is a 66 y.o. year old female who presents s/p right femoral and external iliac artery endarterectomy with profundoplasty and right femoral to below-knee popliteal artery bypass with vein in addition to ray amputation of the  right first second and third toes on 10/22/2016.  Dr. Imogene Burn spoke with pt and daughter and examined pt.  I packed right  groin wound with hydrogel and 4x4 gauze, we do not carry Santyl.  HH to use Santyl to right groin incision, pack with damp to dry NS packing, dry gauze on top, twice daily.   The patient's bypass right leg incisions are healing appropriately with resolution of pre-operative symptoms. She has a follow up appointment with Dr. Edilia Bo scheduled on 12-04-16.   I discussed in depth with the patient the nature of atherosclerosis, and emphasized the importance of maximal medical management including strict control of blood pressure, blood glucose, and lipid levels, obtaining regular exercise, and cessation of smoking.  The patient is aware that without maximal medical management the underlying atherosclerotic disease process will progress, limiting the benefit of any interventions.    I emphasized the importance of routine surveillance of the patient's bypass, as the vascular surgery literature emphasize the improved patency possible with assisted primary patency procedures versus secondary patency procedures. The patient agrees to participate in their maximal medical care and routine surveillance.  Thank you for allowing [place] to participate in this patient's care.  Kylen Schliep, Carma Lair, RN, MSN, FNP-C [facility] [place] Office: [phone]  11/27/2016, 4:21 PM  Clinic MD: Chan/Early

## 2016-11-28 ENCOUNTER — Ambulatory Visit: Payer: Medicare Other | Admitting: Family

## 2016-11-29 ENCOUNTER — Telehealth: Payer: Self-pay | Admitting: *Deleted

## 2016-11-29 ENCOUNTER — Other Ambulatory Visit: Payer: Self-pay | Admitting: Internal Medicine

## 2016-11-29 ENCOUNTER — Other Ambulatory Visit: Payer: Self-pay | Admitting: Cardiovascular Disease

## 2016-11-29 ENCOUNTER — Other Ambulatory Visit: Payer: Self-pay | Admitting: *Deleted

## 2016-11-29 DIAGNOSIS — I739 Peripheral vascular disease, unspecified: Secondary | ICD-10-CM

## 2016-11-29 MED ORDER — COLLAGENASE 250 UNIT/GM EX OINT: 1.0000 "application " | TOPICAL_OINTMENT | Freq: Every day | CUTANEOUS | 1 refills | Status: DC

## 2016-11-29 NOTE — Telephone Encounter (Signed)
Called patient to check on her status and to confirm her home health agency.  She states that [facility] is coming tomorrow to provide wound care and she will need the [place] called to [facility] on [address] and [address].  I sent RX through epic and called in to make certain it would be available for pickup. Also I spoke to [place] at [facility] to verify wound care orders.

## 2016-11-30 DIAGNOSIS — I110 Hypertensive heart disease with heart failure: Secondary | ICD-10-CM | POA: Diagnosis not present

## 2016-11-30 DIAGNOSIS — Z4781 Encounter for orthopedic aftercare following surgical amputation: Secondary | ICD-10-CM | POA: Diagnosis not present

## 2016-11-30 DIAGNOSIS — E1151 Type 2 diabetes mellitus with diabetic peripheral angiopathy without gangrene: Secondary | ICD-10-CM | POA: Diagnosis not present

## 2016-11-30 DIAGNOSIS — T8131XD Disruption of external operation (surgical) wound, not elsewhere classified, subsequent encounter: Secondary | ICD-10-CM | POA: Diagnosis not present

## 2016-11-30 DIAGNOSIS — I509 Heart failure, unspecified: Secondary | ICD-10-CM | POA: Diagnosis not present

## 2016-11-30 DIAGNOSIS — E1142 Type 2 diabetes mellitus with diabetic polyneuropathy: Secondary | ICD-10-CM | POA: Diagnosis not present

## 2016-12-01 ENCOUNTER — Encounter: Payer: Self-pay | Admitting: Internal Medicine

## 2016-12-03 DIAGNOSIS — Z23 Encounter for immunization: Secondary | ICD-10-CM | POA: Diagnosis not present

## 2016-12-03 DIAGNOSIS — R109 Unspecified abdominal pain: Secondary | ICD-10-CM | POA: Diagnosis not present

## 2016-12-03 DIAGNOSIS — Z95828 Presence of other vascular implants and grafts: Secondary | ICD-10-CM | POA: Diagnosis not present

## 2016-12-03 DIAGNOSIS — Z89411 Acquired absence of right great toe: Secondary | ICD-10-CM | POA: Diagnosis not present

## 2016-12-03 DIAGNOSIS — I739 Peripheral vascular disease, unspecified: Secondary | ICD-10-CM | POA: Diagnosis not present

## 2016-12-04 ENCOUNTER — Encounter: Payer: Self-pay | Admitting: Vascular Surgery

## 2016-12-04 ENCOUNTER — Ambulatory Visit (INDEPENDENT_AMBULATORY_CARE_PROVIDER_SITE_OTHER): Payer: Self-pay | Admitting: Vascular Surgery

## 2016-12-04 VITALS — BP 128/66 | HR 71 | Temp 98.4°F | Resp 16 | Ht 64.0 in | Wt 164.0 lb

## 2016-12-04 DIAGNOSIS — Z48812 Encounter for surgical aftercare following surgery on the circulatory system: Secondary | ICD-10-CM

## 2016-12-04 NOTE — Progress Notes (Signed)
Patient name: [name] [name] MRN: [id number] DOB: 1950/05/03 Sex: female  REASON FOR VISIT:   Follow-up of wound problem.  HPI:   [name] [name] is a pleasant 66 y.o. female who underwent a right femoral and external iliac artery endarterectomy with profundoplasty and right femoral to below-knee popliteal artery bypass with a vein graft on 10/22/2016.  The patient also had amputation of the right first, second, and third toes.  The patient was seen by our nurse practitioner on 11/27/2016 with some dehiscence of the right groin incision.  They instructed her to pack the wound with hydrogel moistened 4 x 4.  Comes in for a follow-up visit.  They have been doing dressing changes with hydrogel and Santyl.  She denies any significant pain in the right leg and is been gradually increasing her activity.  Current Outpatient Medications  Medication Sig Dispense Refill  . amLODipine (NORVASC) 5 MG tablet TAKE 1 TABLET(5 MG) BY MOUTH DAILY 30 tablet 0  . aspirin EC 81 MG tablet Take 81 mg by mouth daily.    Marland Kitchen atorvastatin (LIPITOR) 80 MG tablet Take 80 mg by mouth. Take one tablet daily for hyperlipidemia    . clopidogrel (PLAVIX) 75 MG tablet Take 75 mg by mouth. Take one tablet daily for PVD    . collagenase (SANTYL) ointment Apply 1 application topically daily. 30 g 1  . furosemide (LASIX) 40 MG tablet Take 40 mg by mouth daily.     Marland Kitchen gabapentin (NEURONTIN) 300 MG capsule Take 300 mg by mouth 3 (three) times daily.     Marland Kitchen GLUCERNA (GLUCERNA) LIQD Take 237 mLs by mouth. Drink one daily for poor appetite    . isosorbide mononitrate (IMDUR) 30 MG 24 hr tablet 30 mg. Take 1 tablet along by mouth with 60 mg to equal 90 mg daily.    . isosorbide mononitrate (IMDUR) 60 MG 24 hr tablet 60 mg. Take 1 tablet by mouth along with 30 mg to equal 90 mg daily    . LEVEMIR FLEXTOUCH 100 UNIT/ML Pen INJECT 30 UNITS INTO THE SKIN HS UTD. DISCONTINUE LANTUS  3  . losartan (COZAAR) 100 MG tablet TAKE 1 TABLET BY MOUTH  EVERY DAY 90 tablet 1  . metFORMIN (GLUCOPHAGE) 1000 MG tablet Take 1,000 mg by mouth 2 (two) times daily with a meal.    . metoprolol succinate (TOPROL-XL) 50 MG 24 hr tablet TAKE 1 AND 1/2 TABLET BY MOUTH DAILY 45 tablet 5  . pantoprazole (PROTONIX) 40 MG tablet TAKE 1 TABLET BY MOUTH DAILY 90 tablet 3  . potassium chloride (K-DUR,KLOR-CON) 10 MEQ tablet Take 10 mEq by mouth daily.     . promethazine (PHENERGAN) 25 MG tablet Take 25 mg by mouth. Take one tablet every 6 hours as needed for nausea/vomiting     No current facility-administered medications for this visit.     REVIEW OF SYSTEMS:  [X]  denotes positive finding, [ ]  denotes negative finding Cardiac  Comments:  Chest pain or chest pressure:    Shortness of breath upon exertion:    Short of breath when lying flat:    Irregular heart rhythm:    Constitutional    Fever or chills:     PHYSICAL EXAM:   Vitals:   12/04/16 1501  BP: 128/66  Pulse: 71  Resp: 16  Temp: 98.4 F (36.9 C)  TempSrc: Oral  SpO2: 100%  Weight: 164 lb (74.4 kg)  Height: 5\' 4"  (1.626 m)    GENERAL:  The patient is a well-nourished female, in no acute distress. The vital signs are documented above. CARDIOVASCULAR: There is a regular rate and rhythm. PULMONARY: There is good air exchange bilaterally without wheezing or rales. VASCULAR: The patient has a brisk posterior tibial signal with the Doppler. The right groin wound had some fibrin tissue which I debrided in the office today.  There is no exposed graft. The toe amputation sites look good and her sutures were removed in the office today.  DATA:   No new data.  MEDICAL ISSUES:   STATUS POST RIGHT LOWER EXTREMITY BYPASS: Patient is doing well status post right femoral and external iliac artery endarterectomy with profundoplasty and right femoral to below-knee popliteal artery bypass with vein.  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  The toe amputation  sites are healing nicely.  Her bypass graft is patent.  I have instructed them to continue the dressing changes to the right groin with Santyl.  I will see her back in 3 weeks to check on her wound.  She knows to call sooner if she has problems.  We will then get her on a protocol to follow her graft.  Waverly Ferrariickson, Flo Berroa [facility] [place] [facility] [phone]

## 2016-12-05 DIAGNOSIS — T8131XD Disruption of external operation (surgical) wound, not elsewhere classified, subsequent encounter: Secondary | ICD-10-CM | POA: Diagnosis not present

## 2016-12-05 DIAGNOSIS — Z4781 Encounter for orthopedic aftercare following surgical amputation: Secondary | ICD-10-CM | POA: Diagnosis not present

## 2016-12-05 DIAGNOSIS — E1151 Type 2 diabetes mellitus with diabetic peripheral angiopathy without gangrene: Secondary | ICD-10-CM | POA: Diagnosis not present

## 2016-12-05 DIAGNOSIS — E1142 Type 2 diabetes mellitus with diabetic polyneuropathy: Secondary | ICD-10-CM | POA: Diagnosis not present

## 2016-12-05 DIAGNOSIS — I509 Heart failure, unspecified: Secondary | ICD-10-CM | POA: Diagnosis not present

## 2016-12-05 DIAGNOSIS — I110 Hypertensive heart disease with heart failure: Secondary | ICD-10-CM | POA: Diagnosis not present

## 2016-12-09 DIAGNOSIS — E1142 Type 2 diabetes mellitus with diabetic polyneuropathy: Secondary | ICD-10-CM | POA: Diagnosis not present

## 2016-12-09 DIAGNOSIS — T8131XD Disruption of external operation (surgical) wound, not elsewhere classified, subsequent encounter: Secondary | ICD-10-CM | POA: Diagnosis not present

## 2016-12-09 DIAGNOSIS — I110 Hypertensive heart disease with heart failure: Secondary | ICD-10-CM | POA: Diagnosis not present

## 2016-12-09 DIAGNOSIS — E1151 Type 2 diabetes mellitus with diabetic peripheral angiopathy without gangrene: Secondary | ICD-10-CM | POA: Diagnosis not present

## 2016-12-09 DIAGNOSIS — Z4781 Encounter for orthopedic aftercare following surgical amputation: Secondary | ICD-10-CM | POA: Diagnosis not present

## 2016-12-09 DIAGNOSIS — I509 Heart failure, unspecified: Secondary | ICD-10-CM | POA: Diagnosis not present

## 2016-12-14 DIAGNOSIS — T8131XD Disruption of external operation (surgical) wound, not elsewhere classified, subsequent encounter: Secondary | ICD-10-CM | POA: Diagnosis not present

## 2016-12-14 DIAGNOSIS — I509 Heart failure, unspecified: Secondary | ICD-10-CM | POA: Diagnosis not present

## 2016-12-14 DIAGNOSIS — I110 Hypertensive heart disease with heart failure: Secondary | ICD-10-CM | POA: Diagnosis not present

## 2016-12-14 DIAGNOSIS — E1142 Type 2 diabetes mellitus with diabetic polyneuropathy: Secondary | ICD-10-CM | POA: Diagnosis not present

## 2016-12-14 DIAGNOSIS — E1151 Type 2 diabetes mellitus with diabetic peripheral angiopathy without gangrene: Secondary | ICD-10-CM | POA: Diagnosis not present

## 2016-12-14 DIAGNOSIS — Z4781 Encounter for orthopedic aftercare following surgical amputation: Secondary | ICD-10-CM | POA: Diagnosis not present

## 2016-12-16 DIAGNOSIS — E1151 Type 2 diabetes mellitus with diabetic peripheral angiopathy without gangrene: Secondary | ICD-10-CM | POA: Diagnosis not present

## 2016-12-16 DIAGNOSIS — T8131XD Disruption of external operation (surgical) wound, not elsewhere classified, subsequent encounter: Secondary | ICD-10-CM | POA: Diagnosis not present

## 2016-12-16 DIAGNOSIS — Z4781 Encounter for orthopedic aftercare following surgical amputation: Secondary | ICD-10-CM | POA: Diagnosis not present

## 2016-12-16 DIAGNOSIS — E1142 Type 2 diabetes mellitus with diabetic polyneuropathy: Secondary | ICD-10-CM | POA: Diagnosis not present

## 2016-12-16 DIAGNOSIS — I509 Heart failure, unspecified: Secondary | ICD-10-CM | POA: Diagnosis not present

## 2016-12-16 DIAGNOSIS — I110 Hypertensive heart disease with heart failure: Secondary | ICD-10-CM | POA: Diagnosis not present

## 2016-12-23 DIAGNOSIS — T8131XD Disruption of external operation (surgical) wound, not elsewhere classified, subsequent encounter: Secondary | ICD-10-CM | POA: Diagnosis not present

## 2016-12-23 DIAGNOSIS — E1142 Type 2 diabetes mellitus with diabetic polyneuropathy: Secondary | ICD-10-CM | POA: Diagnosis not present

## 2016-12-23 DIAGNOSIS — I110 Hypertensive heart disease with heart failure: Secondary | ICD-10-CM | POA: Diagnosis not present

## 2016-12-23 DIAGNOSIS — I509 Heart failure, unspecified: Secondary | ICD-10-CM | POA: Diagnosis not present

## 2016-12-23 DIAGNOSIS — E1151 Type 2 diabetes mellitus with diabetic peripheral angiopathy without gangrene: Secondary | ICD-10-CM | POA: Diagnosis not present

## 2016-12-23 DIAGNOSIS — Z4781 Encounter for orthopedic aftercare following surgical amputation: Secondary | ICD-10-CM | POA: Diagnosis not present

## 2016-12-25 ENCOUNTER — Ambulatory Visit: Payer: Medicare Other | Admitting: Vascular Surgery

## 2016-12-27 DIAGNOSIS — E1151 Type 2 diabetes mellitus with diabetic peripheral angiopathy without gangrene: Secondary | ICD-10-CM | POA: Diagnosis not present

## 2016-12-27 DIAGNOSIS — E1142 Type 2 diabetes mellitus with diabetic polyneuropathy: Secondary | ICD-10-CM | POA: Diagnosis not present

## 2016-12-27 DIAGNOSIS — Z4781 Encounter for orthopedic aftercare following surgical amputation: Secondary | ICD-10-CM | POA: Diagnosis not present

## 2016-12-27 DIAGNOSIS — T8131XD Disruption of external operation (surgical) wound, not elsewhere classified, subsequent encounter: Secondary | ICD-10-CM | POA: Diagnosis not present

## 2016-12-27 DIAGNOSIS — I110 Hypertensive heart disease with heart failure: Secondary | ICD-10-CM | POA: Diagnosis not present

## 2016-12-27 DIAGNOSIS — I509 Heart failure, unspecified: Secondary | ICD-10-CM | POA: Diagnosis not present

## 2016-12-30 ENCOUNTER — Telehealth: Payer: Self-pay | Admitting: *Deleted

## 2016-12-30 DIAGNOSIS — I509 Heart failure, unspecified: Secondary | ICD-10-CM | POA: Diagnosis not present

## 2016-12-30 DIAGNOSIS — T8131XD Disruption of external operation (surgical) wound, not elsewhere classified, subsequent encounter: Secondary | ICD-10-CM | POA: Diagnosis not present

## 2016-12-30 DIAGNOSIS — E1151 Type 2 diabetes mellitus with diabetic peripheral angiopathy without gangrene: Secondary | ICD-10-CM | POA: Diagnosis not present

## 2016-12-30 DIAGNOSIS — I110 Hypertensive heart disease with heart failure: Secondary | ICD-10-CM | POA: Diagnosis not present

## 2016-12-30 DIAGNOSIS — E1142 Type 2 diabetes mellitus with diabetic polyneuropathy: Secondary | ICD-10-CM | POA: Diagnosis not present

## 2016-12-30 DIAGNOSIS — Z4781 Encounter for orthopedic aftercare following surgical amputation: Secondary | ICD-10-CM | POA: Diagnosis not present

## 2016-12-30 NOTE — Telephone Encounter (Signed)
Message For: O/C                  Taken 30-NOV-18 at  5:07PM by ASA Delivered 30-NOV-18 at  5:51PM by JAB to                ------------------------------------------------------------ Caller [facility]                      Patient [name] [name]      *CB IF NEEDED*              DOB 06/30/2050 Pt's Dr Edilia Bo                      Area Code & Ph#< [phone]   # >CID [id number]   RE BOILED WATER GOT ON LEFT FOOT, NOW HAS A          BLISTER THE SIZE OF A HALF DOLLER  Disp:Y/N Y/F     PT   1: 30-NOV-18 05:08 PM KSK P01                 [id number]         PAGED DR CAIN WITH CLR'S #-                 I called  Ms Scialdone and she stated that the [facility] nurse will be coming back out at 1400 today to re-eval her foot wounds. Patient already has an appt with Rosalita Chessman, NP on Wednesday this week for wound check.

## 2017-01-01 ENCOUNTER — Ambulatory Visit (INDEPENDENT_AMBULATORY_CARE_PROVIDER_SITE_OTHER): Payer: Medicare Other | Admitting: Family

## 2017-01-01 ENCOUNTER — Encounter: Payer: Self-pay | Admitting: Family

## 2017-01-01 VITALS — BP 159/82 | HR 79 | Temp 97.2°F | Resp 18 | Wt 176.6 lb

## 2017-01-01 DIAGNOSIS — T8131XD Disruption of external operation (surgical) wound, not elsewhere classified, subsequent encounter: Secondary | ICD-10-CM

## 2017-01-01 DIAGNOSIS — Z95828 Presence of other vascular implants and grafts: Secondary | ICD-10-CM

## 2017-01-01 DIAGNOSIS — R609 Edema, unspecified: Secondary | ICD-10-CM

## 2017-01-01 DIAGNOSIS — I779 Disorder of arteries and arterioles, unspecified: Secondary | ICD-10-CM

## 2017-01-01 DIAGNOSIS — S90822A Blister (nonthermal), left foot, initial encounter: Secondary | ICD-10-CM
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
## 2017-01-02 DIAGNOSIS — I110 Hypertensive heart disease with heart failure: Secondary | ICD-10-CM | POA: Diagnosis not present

## 2017-01-02 DIAGNOSIS — I509 Heart failure, unspecified: Secondary | ICD-10-CM | POA: Diagnosis not present

## 2017-01-02 DIAGNOSIS — E1142 Type 2 diabetes mellitus with diabetic polyneuropathy: Secondary | ICD-10-CM | POA: Diagnosis not present

## 2017-01-02 DIAGNOSIS — E1151 Type 2 diabetes mellitus with diabetic peripheral angiopathy without gangrene: Secondary | ICD-10-CM | POA: Diagnosis not present

## 2017-01-02 DIAGNOSIS — Z4781 Encounter for orthopedic aftercare following surgical amputation: Secondary | ICD-10-CM | POA: Diagnosis not present

## 2017-01-02 DIAGNOSIS — T8131XD Disruption of external operation (surgical) wound, not elsewhere classified, subsequent encounter: Secondary | ICD-10-CM | POA: Diagnosis not present

## 2017-01-11 DIAGNOSIS — E1142 Type 2 diabetes mellitus with diabetic polyneuropathy: Secondary | ICD-10-CM | POA: Diagnosis not present

## 2017-01-11 DIAGNOSIS — T8131XD Disruption of external operation (surgical) wound, not elsewhere classified, subsequent encounter: Secondary | ICD-10-CM | POA: Diagnosis not present

## 2017-01-11 DIAGNOSIS — I110 Hypertensive heart disease with heart failure: Secondary | ICD-10-CM | POA: Diagnosis not present

## 2017-01-11 DIAGNOSIS — I509 Heart failure, unspecified: Secondary | ICD-10-CM | POA: Diagnosis not present

## 2017-01-11 DIAGNOSIS — Z4781 Encounter for orthopedic aftercare following surgical amputation: Secondary | ICD-10-CM | POA: Diagnosis not present

## 2017-01-11 DIAGNOSIS — E1151 Type 2 diabetes mellitus with diabetic peripheral angiopathy without gangrene: Secondary | ICD-10-CM | POA: Diagnosis not present

## 2017-01-17 DIAGNOSIS — I509 Heart failure, unspecified: Secondary | ICD-10-CM | POA: Diagnosis not present

## 2017-01-17 DIAGNOSIS — Z4781 Encounter for orthopedic aftercare following surgical amputation: Secondary | ICD-10-CM | POA: Diagnosis not present

## 2017-01-17 DIAGNOSIS — I110 Hypertensive heart disease with heart failure: Secondary | ICD-10-CM | POA: Diagnosis not present

## 2017-01-17 DIAGNOSIS — E1142 Type 2 diabetes mellitus with diabetic polyneuropathy: Secondary | ICD-10-CM | POA: Diagnosis not present

## 2017-01-17 DIAGNOSIS — E1151 Type 2 diabetes mellitus with diabetic peripheral angiopathy without gangrene: Secondary | ICD-10-CM | POA: Diagnosis not present

## 2017-01-17 DIAGNOSIS — T8131XD Disruption of external operation (surgical) wound, not elsewhere classified, subsequent encounter: Secondary | ICD-10-CM | POA: Diagnosis not present

## 2017-01-26 ENCOUNTER — Other Ambulatory Visit: Payer: Self-pay | Admitting: Internal Medicine

## 2017-01-27 DIAGNOSIS — E1142 Type 2 diabetes mellitus with diabetic polyneuropathy: Secondary | ICD-10-CM | POA: Diagnosis not present

## 2017-01-27 DIAGNOSIS — I110 Hypertensive heart disease with heart failure: Secondary | ICD-10-CM | POA: Diagnosis not present

## 2017-01-27 DIAGNOSIS — I509 Heart failure, unspecified: Secondary | ICD-10-CM | POA: Diagnosis not present

## 2017-01-27 DIAGNOSIS — T8131XD Disruption of external operation (surgical) wound, not elsewhere classified, subsequent encounter: Secondary | ICD-10-CM | POA: Diagnosis not present

## 2017-01-27 DIAGNOSIS — E1151 Type 2 diabetes mellitus with diabetic peripheral angiopathy without gangrene: Secondary | ICD-10-CM | POA: Diagnosis not present

## 2017-01-27 DIAGNOSIS — Z4781 Encounter for orthopedic aftercare following surgical amputation: Secondary | ICD-10-CM | POA: Diagnosis not present

## 2017-01-29 DIAGNOSIS — Z4781 Encounter for orthopedic aftercare following surgical amputation: Secondary | ICD-10-CM | POA: Diagnosis not present

## 2017-01-29 DIAGNOSIS — T8131XD Disruption of external operation (surgical) wound, not elsewhere classified, subsequent encounter: Secondary | ICD-10-CM | POA: Diagnosis not present

## 2017-01-29 DIAGNOSIS — E1151 Type 2 diabetes mellitus with diabetic peripheral angiopathy without gangrene: Secondary | ICD-10-CM | POA: Diagnosis not present

## 2017-01-29 DIAGNOSIS — I509 Heart failure, unspecified: Secondary | ICD-10-CM | POA: Diagnosis not present

## 2017-01-29 DIAGNOSIS — E1142 Type 2 diabetes mellitus with diabetic polyneuropathy: Secondary | ICD-10-CM | POA: Diagnosis not present

## 2017-01-29 DIAGNOSIS — I110 Hypertensive heart disease with heart failure: Secondary | ICD-10-CM | POA: Diagnosis not present

## 2017-01-30 ENCOUNTER — Ambulatory Visit: Payer: Medicare Other | Admitting: Family

## 2017-02-02 ENCOUNTER — Other Ambulatory Visit: Payer: Self-pay | Admitting: Internal Medicine

## 2017-02-05 ENCOUNTER — Ambulatory Visit: Payer: Medicare Other | Admitting: Vascular Surgery

## 2017-02-09 ENCOUNTER — Other Ambulatory Visit: Payer: Self-pay | Admitting: Internal Medicine

## 2017-02-11 DIAGNOSIS — I110 Hypertensive heart disease with heart failure: Secondary | ICD-10-CM | POA: Diagnosis not present

## 2017-02-11 DIAGNOSIS — I509 Heart failure, unspecified: Secondary | ICD-10-CM | POA: Diagnosis not present

## 2017-02-11 DIAGNOSIS — T8131XD Disruption of external operation (surgical) wound, not elsewhere classified, subsequent encounter: Secondary | ICD-10-CM | POA: Diagnosis not present

## 2017-02-11 DIAGNOSIS — E1151 Type 2 diabetes mellitus with diabetic peripheral angiopathy without gangrene: Secondary | ICD-10-CM | POA: Diagnosis not present

## 2017-02-11 DIAGNOSIS — Z4781 Encounter for orthopedic aftercare following surgical amputation: Secondary | ICD-10-CM | POA: Diagnosis not present

## 2017-02-11 DIAGNOSIS — E1142 Type 2 diabetes mellitus with diabetic polyneuropathy: Secondary | ICD-10-CM | POA: Diagnosis not present

## 2017-02-13 DIAGNOSIS — Z4781 Encounter for orthopedic aftercare following surgical amputation: Secondary | ICD-10-CM | POA: Diagnosis not present

## 2017-02-13 DIAGNOSIS — I509 Heart failure, unspecified: Secondary | ICD-10-CM | POA: Diagnosis not present

## 2017-02-13 DIAGNOSIS — I110 Hypertensive heart disease with heart failure: Secondary | ICD-10-CM | POA: Diagnosis not present

## 2017-02-13 DIAGNOSIS — T8131XD Disruption of external operation (surgical) wound, not elsewhere classified, subsequent encounter: Secondary | ICD-10-CM | POA: Diagnosis not present

## 2017-02-13 DIAGNOSIS — E1142 Type 2 diabetes mellitus with diabetic polyneuropathy: Secondary | ICD-10-CM | POA: Diagnosis not present

## 2017-02-13 DIAGNOSIS — E1151 Type 2 diabetes mellitus with diabetic peripheral angiopathy without gangrene: Secondary | ICD-10-CM | POA: Diagnosis not present

## 2017-02-25 ENCOUNTER — Other Ambulatory Visit: Payer: Self-pay | Admitting: Cardiovascular Disease

## 2017-02-28 ENCOUNTER — Other Ambulatory Visit: Payer: Self-pay | Admitting: Internal Medicine

## 2017-03-05 ENCOUNTER — Encounter: Payer: Self-pay | Admitting: Vascular Surgery

## 2017-03-05 ENCOUNTER — Ambulatory Visit (INDEPENDENT_AMBULATORY_CARE_PROVIDER_SITE_OTHER): Payer: Medicare Other | Admitting: Vascular Surgery

## 2017-03-05 VITALS — BP 138/65 | HR 72 | Temp 97.8°F | Resp 18 | Ht 64.0 in | Wt 176.0 lb

## 2017-03-05 DIAGNOSIS — I779 Disorder of arteries and arterioles, unspecified: Secondary | ICD-10-CM | POA: Diagnosis not present

## 2017-03-05 NOTE — Progress Notes (Signed)
Patient name: [name] [name] MRN: [id number] DOB: May 14, 1950 Sex: female  REASON FOR VISIT:   Follow-up of wounds.  HPI:   [name] [name] is a pleasant 67 y.o. female was last seen by the nurse practitioner Rosalita Chessman Nickel on 01/01/2017.  This patient had undergone a right femoral to below-knee popliteal artery bypass with a vein graft on 10/22/2016.  She had a small wound in the right groin and comes in for follow-up.  She also developed a small wound on the dorsum of her left foot.  Right groin wound has healed according to her daughter.  The left foot wound has improved.  She has had bilateral lower extremity swelling.  She denies any rest pain.  Her activity is fairly limited but I do not get any history of claudication.   Past Medical History:  Diagnosis Date  . Anemia 10/2015   Acute Blood Loss  . Arthritis    "feel like I have it all over" (08/28/2015)  . CHF (congestive heart failure) (HCC)   . Complication of anesthesia    DIFFICULT WAKING "only when I was smoking; no problems since I quit"  . Coronary artery disease   . Family history of adverse reaction to anesthesia    sister slow to wake up  . GERD (gastroesophageal reflux disease)    takes Protonix daily   . Hip bursitis   . History of blood transfusion    10/2015  . Hyperlipidemia LDL goal < 70 06/28/2013   takes Atorvastatin daily  . Hypertension    takes Metoprolol and Imdur daily  . Malnutrition (HCC)   . Migraine    "none in a long time" (08/28/2015)  . Myocardial infarction (HCC) 2011  . Neuromuscular disorder (HCC)    DIABETIC NEUROPATHY  . Osteomyelitis (HCC) 2017   Left foot  . PAD (peripheral artery disease) (HCC)   . Peripheral vascular disease (HCC)   . Respiratory failure (HCC) 10/2015   Acute Hypoxia- acute pulmonary edema 11/13/2015  . Septic shock (HCC) 10/2015  . Stroke (HCC)   . Type II diabetes mellitus (HCC)    takes Lantus nightly.Average fasting blood sugar runs 80-90  Type II     Family History  Problem Relation Age of Onset  . Hypertension Mother   . Heart failure Mother   . Heart failure Father   . Stroke Father   . Diabetes Father     SOCIAL HISTORY: Social History   Tobacco Use  . Smoking status: Former Smoker    Packs/day: 1.50    Years: 41.00    Pack years: 61.50    Types: Cigarettes    Last attempt to quit: 10/12/2009    Years since quitting: 7.4  . Smokeless tobacco: Never Used  Substance Use Topics  . Alcohol use: No    Allergies  Allergen Reactions  . Doxycycline Other (See Comments)    lethargy  . Hydrochlorothiazide Other (See Comments)    lethargic   . Latex Rash  . Penicillins Swelling and Rash    Pt states she has tolerated Keflex in the past without problems. States she may have tolerated Augmentin in the past but it caused GI upset. Has patient had a PCN reaction causing immediate rash, facial/tongue/throat swelling, SOB or lightheadedness with hypotension: Yes Has patient had a PCN reaction causing severe rash involving mucus membranes or skin necrosis: No Has patient had a PCN reaction that required hospitalization No Has patient had a PCN reaction occurring within  the last 10 years: No    Current Outpatient Medications  Medication Sig Dispense Refill  . amLODipine (NORVASC) 5 MG tablet TAKE 1 TABLET(5 MG) BY MOUTH DAILY 30 tablet 0  . aspirin EC 81 MG tablet Take 81 mg by mouth daily.    Marland Kitchen atorvastatin (LIPITOR) 80 MG tablet Take 80 mg by mouth. Take one tablet daily for hyperlipidemia    . clopidogrel (PLAVIX) 75 MG tablet Take 75 mg by mouth. Take one tablet daily for PVD    . collagenase (SANTYL) ointment Apply 1 application topically daily. 30 g 1  . furosemide (LASIX) 40 MG tablet Take 40 mg by mouth daily.     Marland Kitchen gabapentin (NEURONTIN) 300 MG capsule Take 300 mg by mouth 3 (three) times daily.     Marland Kitchen GLUCERNA (GLUCERNA) LIQD Take 237 mLs by mouth. Drink one daily for poor appetite    . isosorbide mononitrate  (IMDUR) 30 MG 24 hr tablet 30 mg. Take 1 tablet along by mouth with 60 mg to equal 90 mg daily.    . isosorbide mononitrate (IMDUR) 60 MG 24 hr tablet 60 mg. Take 1 tablet by mouth along with 30 mg to equal 90 mg daily    . LEVEMIR FLEXTOUCH 100 UNIT/ML Pen INJECT 30 UNITS INTO THE SKIN HS UTD. DISCONTINUE LANTUS  3  . losartan (COZAAR) 100 MG tablet TAKE 1 TABLET BY MOUTH EVERY DAY 90 tablet 1  . metFORMIN (GLUCOPHAGE) 1000 MG tablet Take 1,000 mg by mouth 2 (two) times daily with a meal.    . metoprolol succinate (TOPROL-XL) 50 MG 24 hr tablet TAKE 1 AND 1/2 TABLETS BY MOUTH DAILY( NEW DOSE) 45 tablet 0  . pantoprazole (PROTONIX) 40 MG tablet TAKE 1 TABLET BY MOUTH DAILY 90 tablet 3  . potassium chloride (K-DUR,KLOR-CON) 10 MEQ tablet Take 10 mEq by mouth daily.      No current facility-administered medications for this visit.     REVIEW OF SYSTEMS:  [X]  denotes positive finding, [ ]  denotes negative finding Cardiac  Comments:  Chest pain or chest pressure:    Shortness of breath upon exertion:    Short of breath when lying flat:    Irregular heart rhythm:        Vascular    Pain in calf, thigh, or hip brought on by ambulation:    Pain in feet at night that wakes you up from your sleep:     Blood clot in your veins:    Leg swelling:         Pulmonary    Oxygen at home:    Productive cough:     Wheezing:         Neurologic    Sudden weakness in arms or legs:     Sudden numbness in arms or legs:     Sudden onset of difficulty speaking or slurred speech:    Temporary loss of vision in one eye:     Problems with dizziness:         Gastrointestinal    Blood in stool:     Vomited blood:         Genitourinary    Burning when urinating:     Blood in urine:        Psychiatric    Major depression:         Hematologic    Bleeding problems:    Problems with blood clotting too easily:  Skin    Rashes or ulcers:        Constitutional    Fever or chills:      PHYSICAL EXAM:   Vitals:   03/05/17 1448 03/05/17 1452  BP: (!) 143/66 138/65  Pulse: 72   Resp: 18   Temp: 97.8 F (36.6 C)   TempSrc: Oral   SpO2: 99%   Weight: 176 lb (79.8 kg)   Height: 5\' 4"  (1.626 m)     GENERAL: The patient is a well-nourished female, in no acute distress. The vital signs are documented above. CARDIAC: There is a regular rate and rhythm.  VASCULAR: She has a left carotid bruit. She has a brisk dorsalis pedis and posterior tibial signal in both feet. She has moderate bilateral lower extremity swelling. PULMONARY: There is good air exchange bilaterally without wheezing or rales. ABDOMEN: Soft and non-tender with normal pitched bowel sounds.  MUSCULOSKELETAL: There are no major deformities or cyanosis. NEUROLOGIC: No focal weakness or paresthesias are detected. SKIN: The right groin wound is healed.  There is a 3 mm wound on the dorsum of her left foot which has good granulation tissue. PSYCHIATRIC: The patient has a normal affect.  DATA:    CAROTID DUPLEX: I reviewed her carotid duplex scan from 07/17/2016.  She has a known right internal carotid artery occlusion.  She had a less than 39% left carotid stenosis.  She has a greater than 50% left external carotid artery stenosis.  MEDICAL ISSUES:   STATUS POST BILATERAL LOWER EXTREMITY BYPASSES:   On 09/05/2015 she had a left common femoral artery endarterectomy with profundoplasty with vein patch angioplasty and a left common femoral artery to deep femoral artery bypass using left great saphenous vein.  On 11/02/2015, she had a left femoral to below-knee popliteal artery bypass with left great saphenous vein.  On 10/22/2016 she had a right common femoral artery and external iliac artery endarterectomy with profundoplasty using a bovine pericardial patch.  In addition she had a right femoral to below-knee popliteal artery bypass with vein graft.  She then had ray amputation of the right first second and third  toes.  She has excellent Doppler signals in both feet I think her bypass grafts are patent.  I have discussed with her the importance of leg elevation given her leg swelling.  We will arrange for graft duplex scans and ABIs in June of this year.  She knows to call sooner if she has problems.  LEFT CAROTID BRUIT: The patient has a left carotid bruit.  She has a known right internal carotid artery occlusion.  She has no significant plaque on the left side.  She does have an external carotid artery stenosis which explains her bruit.  She is due for a one-year follow-up duplex scan in June 2019.  She is asymptomatic.  She is on aspirin, Plavix, and a statin.  Waverly Ferrari [facility] [phone]

## 2017-03-10 ENCOUNTER — Other Ambulatory Visit: Payer: Self-pay

## 2017-03-10 DIAGNOSIS — I998 Other disorder of circulatory system: Secondary | ICD-10-CM

## 2017-03-10 DIAGNOSIS — I639 Cerebral infarction, unspecified: Secondary | ICD-10-CM

## 2017-03-10 DIAGNOSIS — I70229 Atherosclerosis of native arteries of extremities with rest pain, unspecified extremity: Secondary | ICD-10-CM

## 2017-03-10 DIAGNOSIS — I6529 Occlusion and stenosis of unspecified carotid artery: Secondary | ICD-10-CM

## 2017-03-10 DIAGNOSIS — I739 Peripheral vascular disease, unspecified: Secondary | ICD-10-CM

## 2017-03-23 ENCOUNTER — Other Ambulatory Visit: Payer: Self-pay | Admitting: Cardiovascular Disease

## 2017-04-23 DIAGNOSIS — I739 Peripheral vascular disease, unspecified: Secondary | ICD-10-CM | POA: Diagnosis not present

## 2017-04-23 DIAGNOSIS — E1140 Type 2 diabetes mellitus with diabetic neuropathy, unspecified: Secondary | ICD-10-CM | POA: Diagnosis not present

## 2017-04-23 DIAGNOSIS — I10 Essential (primary) hypertension: Secondary | ICD-10-CM | POA: Diagnosis not present

## 2017-04-23 DIAGNOSIS — Z1389 Encounter for screening for other disorder: Secondary | ICD-10-CM | POA: Diagnosis not present

## 2017-04-23 DIAGNOSIS — Z95828 Presence of other vascular implants and grafts: Secondary | ICD-10-CM | POA: Diagnosis not present

## 2017-04-23 DIAGNOSIS — E1159 Type 2 diabetes mellitus with other circulatory complications: Secondary | ICD-10-CM | POA: Diagnosis not present

## 2017-04-23 DIAGNOSIS — Z89411 Acquired absence of right great toe: Secondary | ICD-10-CM | POA: Diagnosis not present

## 2017-04-23 DIAGNOSIS — I509 Heart failure, unspecified: Secondary | ICD-10-CM | POA: Diagnosis not present

## 2017-07-09 ENCOUNTER — Other Ambulatory Visit (HOSPITAL_COMMUNITY): Payer: Medicare Other

## 2017-07-09 ENCOUNTER — Encounter (HOSPITAL_COMMUNITY): Payer: Medicare Other

## 2017-07-09 ENCOUNTER — Inpatient Hospital Stay (HOSPITAL_COMMUNITY): Admission: RE | Admit: 2017-07-09 | Payer: Medicare Other | Source: Ambulatory Visit

## 2017-07-09 ENCOUNTER — Ambulatory Visit: Payer: Medicare Other | Admitting: Vascular Surgery

## 2017-08-26 ENCOUNTER — Encounter: Payer: Self-pay | Admitting: Podiatry

## 2017-08-26 ENCOUNTER — Ambulatory Visit (INDEPENDENT_AMBULATORY_CARE_PROVIDER_SITE_OTHER): Payer: Medicare Other | Admitting: Podiatry

## 2017-08-26 DIAGNOSIS — M79675 Pain in left toe(s): Secondary | ICD-10-CM | POA: Diagnosis not present

## 2017-08-26 DIAGNOSIS — L84 Corns and callosities: Secondary | ICD-10-CM

## 2017-08-26 DIAGNOSIS — I779 Disorder of arteries and arterioles, unspecified: Secondary | ICD-10-CM | POA: Diagnosis not present

## 2017-08-26 DIAGNOSIS — E1151 Type 2 diabetes mellitus with diabetic peripheral angiopathy without gangrene: Secondary | ICD-10-CM | POA: Diagnosis not present

## 2017-08-26 DIAGNOSIS — B351 Tinea unguium: Secondary | ICD-10-CM | POA: Diagnosis not present

## 2017-08-26 DIAGNOSIS — M79674 Pain in right toe(s): Secondary | ICD-10-CM

## 2017-08-27 NOTE — Progress Notes (Signed)
Subjective:   Patient ID: [name] [name], female   DOB: 67 y.o.   MRN: [id number]   HPI [name] [name] presents to the office today for concerns of thick, elongated toenails that she cannot trim herself. She states since she was last in the office she had 3 toes amputated on the right foot. On the left foot she previously developed a blister in the arch area and underwent a debridement with Dr. Lajoyce Corners. She has developed a thick callus over the area. Denies any open sores or increase in swelling. No other concerns.    Review of Systems  All other systems reviewed and are negative.  Past Medical History:  Diagnosis Date  . Anemia 10/2015   Acute Blood Loss  . Arthritis    "feel like I have it all over" (08/28/2015)  . CHF (congestive heart failure) (HCC)   . Complication of anesthesia    DIFFICULT WAKING "only when I was smoking; no problems since I quit"  . Coronary artery disease   . Family history of adverse reaction to anesthesia    sister slow to wake up  . GERD (gastroesophageal reflux disease)    takes Protonix daily   . Hip bursitis   . History of blood transfusion    10/2015  . Hyperlipidemia LDL goal < 70 06/28/2013   takes Atorvastatin daily  . Hypertension    takes Metoprolol and Imdur daily  . Malnutrition (HCC)   . Migraine    "none in a long time" (08/28/2015)  . Myocardial infarction (HCC) 2011  . Neuromuscular disorder (HCC)    DIABETIC NEUROPATHY  . Osteomyelitis (HCC) 2017   Left foot  . PAD (peripheral artery disease) (HCC)   . Peripheral vascular disease (HCC)   . Respiratory failure (HCC) 10/2015   Acute Hypoxia- acute pulmonary edema 11/13/2015  . Septic shock (HCC) 10/2015  . Stroke (HCC)   . Type II diabetes mellitus (HCC)    takes Lantus nightly.Average fasting blood sugar runs 80-90  Type II    Past Surgical History:  Procedure Laterality Date  . ABDOMINAL HYSTERECTOMY    . AMPUTATION Right 10/22/2016   Procedure: AMPUTATION DIGIT RIGHT TOES 1-3  POSSIBLE TRANSMETATARSAL;  Surgeon: Chuck Hint, MD;  Location: [facility] OR;  Service: Vascular;  Laterality: Right;  . APPENDECTOMY    . ATHERECTOMY N/A 06/04/2011   Procedure: ATHERECTOMY;  Surgeon: Runell Gess, MD;  Location[facility] CATH LAB;  Service: Cardiovascular;  Laterality: N/A;  . CARDIAC CATHETERIZATION  10/13/2009   95% stenosis in the AV groove circumflex and 95% ostial stenosis in small OM3. A 3x52mm drug-eluting Promus stent inserted ito the circumflex. Dilatated with a 3.25x69mm noncompliant Quantum balloon within entire segment. The entire region was reduced to 0% and brisk TIMI3 flow.  . CAROTID DUPLEX  03/19/2011   Right ICA-demonstrates complete occlusion. Left ICA-demonstrates a small amount of fibrous plaque.  Marland Kitchen CATARACT EXTRACTION W/ INTRAOCULAR LENS IMPLANT Right   . CESAREAN SECTION  1990  . CORONARY ANGIOPLASTY    . ENDARTERECTOMY FEMORAL Left 09/05/2015   Procedure: ENDARTERECTOMY FEMORAL WITH PROFUNDOPLASTY;  Surgeon: Chuck Hint, MD;  Location: [facility] OR;  Service: Vascular;  Laterality: Left;  Left common femoral artery vein patch using left saphenous vien  . ENDARTERECTOMY FEMORAL Right 10/22/2016   Procedure: ENDARTERECTOMY RIGHT COMMON FEMORAL;  Surgeon: Chuck Hint, MD;  Location: [facility] OR;  Service: Vascular;  Laterality: Right;  . FEMORAL-POPLITEAL BYPASS GRAFT Left 11/02/2015   Procedure: BYPASS  GRAFT FEMORAL-POPLITEAL ARTERY VS FEMORAL-TIBIAL ARTERY BYPASS;  Surgeon: Chuck Hint, MD;  Location: [facility] OR;  Service: Vascular;  Laterality: Left;  . FEMORAL-POPLITEAL BYPASS GRAFT Right 10/22/2016   Procedure: BYPASS GRAFT RIGHT FEMORAL- BELOW KNEE POPLITEAL ARTERY WITH VEIN;  Surgeon: Chuck Hint, MD;  Location: [facility] OR;  Service: Vascular;  Laterality: Right;  . I&D EXTREMITY Left 11/10/2015   Procedure: Debridement Left Foot Ulcer, Application  Wound VAC;  Surgeon: [name], MD;  Location: [facility] OR;  Service: Orthopedics;   Laterality: Left;  . ILIAC ARTERY STENT Left 08/28/2015   common  . INTRAOPERATIVE ARTERIOGRAM Left 09/05/2015   Procedure: INTRA OPERATIVE ARTERIOGRAM;  Surgeon: Chuck Hint, MD;  Location: [facility] OR;  Service: Vascular;  Laterality: Left;  . INTRAOPERATIVE ARTERIOGRAM Left 11/02/2015   Procedure: INTRA OPERATIVE ARTERIOGRAM;  Surgeon: Chuck Hint, MD;  Location: [facility] OR;  Service: Vascular;  Laterality: Left;  . INTRAOPERATIVE ARTERIOGRAM Right 10/22/2016   Procedure: INTRA OPERATIVE ARTERIOGRAM;  Surgeon: Chuck Hint, MD;  Location: [facility] OR;  Service: Vascular;  Laterality: Right;  . LEXISCAN MYOVIEW  10/25/2010   Moderate perfusion defect due to infarct/scar with mild perinfarct ischemia seen in the Basal Inferolateral, Basal Anterolateral, Mid Inferolateral, and Mid Anterolateral regions. Post-stress EF is 50%.  . LOWER EXTREMITY ANGIOGRAPHY N/A 10/17/2016   Procedure: Lower Extremity Angiography;  Surgeon: Runell Gess, MD;  Location: [facility] INVASIVE CV LAB;  Service: Cardiovascular;  Laterality: N/A;  . OVARY SURGERY  1983?   "ruptured"  . PERIPHERAL VASCULAR ANGIOGRAM  01/26/2010   High-grade SFA disease: left greater than right. Left SFA would require fem-pop bypass grafting. Right SFA could be stented but might require Diamondback Orbital atherectomy.  Marland Kitchen PERIPHERAL VASCULAR ANGIOGRAM  02/23/2010   Stealth Predator orbital rotational atherectomy performed on SFA & Popliteal up to 90,000 RPM. Stenting using overlapping 5x132mm and 5x50mm Absolute Pro Nitinol self-expanding stents beginning just at the knee up to the mid SFA resulting in reduction of 90-95% calcified SFA & Popliteal stenosis to 0. Stenting performed on the distal common & proximal iliac artery with a 10x4 Absolute Pro- 70-0%.  Marland Kitchen PERIPHERAL VASCULAR ANGIOGRAM  06/17/2010   PTA performed to the right external iliac artery stent using a 5x100 balloon at 10 atmospheres. Stenting performed using a 6x18 Genesis  on Opta balloon. Postdilatation with a 7x2 balloon resulting in a 95% "in-stent" stenosis to 0% residual.  . PERIPHERAL VASCULAR ANGIOGRAM  06/04/2011   Bilateral total SFAs not percutaneously addressable. Good canidate for femoropopliteal bypass grafting  . PERIPHERAL VASCULAR ANGIOGRAM  08/28/2015  . PERIPHERAL VASCULAR CATHETERIZATION N/A 08/28/2015   Procedure: Lower Extremity Angiography;  Surgeon: Runell Gess, MD;  Location: [facility] INVASIVE CV LAB;  Service: Cardiovascular;  Laterality: N/A;  . PERIPHERAL VASCULAR CATHETERIZATION N/A 08/28/2015   Procedure: Abdominal Aortogram;  Surgeon: Runell Gess, MD;  Location: [facility] INVASIVE CV LAB;  Service: Cardiovascular;  Laterality: N/A;  . PERIPHERAL VASCULAR CATHETERIZATION Left 08/28/2015   Procedure: Peripheral Vascular Intervention;  Surgeon: Runell Gess, MD;  Location: [facility] INVASIVE CV LAB;  Service: Cardiovascular;  Laterality: Left;  common iliac  . PERIPHERAL VASCULAR CATHETERIZATION Left 08/28/2015   Procedure: Peripheral Vascular Atherectomy;  Surgeon: Runell Gess, MD;  Location: [facility] INVASIVE CV LAB;  Service: Cardiovascular;  Laterality: Left;  common iliac  . PERIPHERAL VASCULAR CATHETERIZATION N/A 09/28/2015   Procedure: Lower Extremity Angiography;  Surgeon: Runell Gess, MD;  Location: [facility] INVASIVE CV LAB;  Service: Cardiovascular;  Laterality: N/A;  . PERIPHERAL VASCULAR CATHETERIZATION Left 09/28/2015   Procedure: Peripheral Vascular Intervention;  Surgeon: Runell Gess, MD;  Location: [facility] INVASIVE CV LAB;  Service: Cardiovascular;  Laterality: Left CFA  PCI with 9 mm x 4 cm Abbott nitinol absolute Pro self-expanding stent     . SKIN SPLIT GRAFT Left 12/01/2015   Procedure: LEFT FOOT SKIN GRAFT AND VAC;  Surgeon: [name], MD;  Location: [facility] OR;  Service: Orthopedics;  Laterality: Left;  . THROMBECTOMY FEMORAL ARTERY Right 10/22/2016   Procedure: THROMBECTOMY FEMORAL ARTERY;  Surgeon: Chuck Hint, MD;   Location: [facility] OR;  Service: Vascular;  Laterality: Right;  . TRANSTHORACIC ECHOCARDIOGRAM  10/17/2009   EF 45-50%, moderate hypokinesis of the entire inferolateral myocardium, mild concentric hypertrophy and mild regurg of the mitral valva.  Marland Kitchen VEIN HARVEST Left 11/02/2015   Procedure: LEFT GREATER SAPHENOUS VEIN HARVEST;  Surgeon: Chuck Hint, MD;  Location: [facility] OR;  Service: Vascular;  Laterality: Left;     Current Outpatient Medications:  .  amLODipine (NORVASC) 5 MG tablet, TAKE 1 TABLET(5 MG) BY MOUTH DAILY, Disp: 30 tablet, Rfl: 0 .  aspirin EC 81 MG tablet, Take 81 mg by mouth daily., Disp: , Rfl:  .  atorvastatin (LIPITOR) 80 MG tablet, Take 80 mg by mouth. Take one tablet daily for hyperlipidemia, Disp: , Rfl:  .  clopidogrel (PLAVIX) 75 MG tablet, Take 75 mg by mouth. Take one tablet daily for PVD, Disp: , Rfl:  .  collagenase (SANTYL) ointment, Apply 1 application topically daily., Disp: 30 g, Rfl: 1 .  furosemide (LASIX) 40 MG tablet, Take 40 mg by mouth daily. , Disp: , Rfl:  .  gabapentin (NEURONTIN) 300 MG capsule, Take 300 mg by mouth 3 (three) times daily. , Disp: , Rfl:  .  GLUCERNA (GLUCERNA) LIQD, Take 237 mLs by mouth. Drink one daily for poor appetite, Disp: , Rfl:  .  isosorbide mononitrate (IMDUR) 30 MG 24 hr tablet, 30 mg. Take 1 tablet along by mouth with 60 mg to equal 90 mg daily., Disp: , Rfl:  .  isosorbide mononitrate (IMDUR) 60 MG 24 hr tablet, 60 mg. Take 1 tablet by mouth along with 30 mg to equal 90 mg daily, Disp: , Rfl:  .  LEVEMIR FLEXTOUCH 100 UNIT/ML Pen, INJECT 30 UNITS INTO THE SKIN HS UTD. DISCONTINUE LANTUS, Disp: , Rfl: 3 .  losartan (COZAAR) 100 MG tablet, TAKE 1 TABLET BY MOUTH EVERY DAY, Disp: 90 tablet, Rfl: 1 .  metFORMIN (GLUCOPHAGE) 1000 MG tablet, Take 1,000 mg by mouth 2 (two) times daily with a meal., Disp: , Rfl:  .  metoprolol succinate (TOPROL-XL) 50 MG 24 hr tablet, TAKE 1 AND 1/2 TABLETS BY MOUTH DAILY( NEW DOSE), Disp: 45 tablet,  Rfl: 2 .  pantoprazole (PROTONIX) 40 MG tablet, TAKE 1 TABLET BY MOUTH DAILY, Disp: 90 tablet, Rfl: 3 .  potassium chloride (K-DUR,KLOR-CON) 10 MEQ tablet, Take 10 mEq by mouth daily. , Disp: , Rfl:   Allergies  Allergen Reactions  . Doxycycline Other (See Comments)    lethargy  . Hydrochlorothiazide Other (See Comments)    lethargic   . Latex Rash  . Penicillins Swelling and Rash    Pt states she has tolerated Keflex in the past without problems. States she may have tolerated Augmentin in the past but it caused GI upset. Has patient had a PCN reaction causing immediate rash, facial/tongue/throat swelling, SOB or lightheadedness with  hypotension: Yes Has patient had a PCN reaction causing severe rash involving mucus membranes or skin necrosis: No Has patient had a PCN reaction that required hospitalization No Has patient had a PCN reaction occurring within the last 10 years: No         Objective:  Physical Exam  General: AAO x3, NAD  Dermatological: Nails are hypertrophic, dystrophic, brittle, discolored, elongated 7. No surrounding redness or drainage. Subjective tenderness nails 1-5 on the left and 4/5 on the right. Thick hyperkeratotic lesion on the left arch and along the incision laterally from the amputation. No underlying ulceration, drainage, signs of infection. No open lesions or pre-ulcerative lesions are identified today.  Vascular: Dorsalis Pedis artery and Posterior Tibial artery pedal pulses are decreased. There is no pain with calf compression, swelling, warmth, erythema.   Neruologic: Sensation decreased with SWMF.    Musculoskeletal: No gross boney pedal deformities bilateral. No pain, crepitus, or limitation noted with foot and ankle range of motion bilateral. Muscular strength 5/5 in all groups tested bilateral.  Gait: Unassisted, Nonantalgic.       Assessment:   Symptomatic onychomycosis; calluses     Plan:  -Treatment options discussed including all  alternatives, risks, and complications -Etiology of symptoms were discussed -Nails debrided 7 without complications or bleeding. -I lightly debrided just the loose hyperkeratotic tissue bilaterally without any complications or bleeding.  -Daily foot inspection -Follow-up in 3 months or sooner if any problems arise. In the meantime, encouraged to call the office with any questions, concerns, change in symptoms.   Ovid Curd, DPM

## 2017-10-01 ENCOUNTER — Ambulatory Visit (INDEPENDENT_AMBULATORY_CARE_PROVIDER_SITE_OTHER)
Admission: RE | Admit: 2017-10-01 | Discharge: 2017-10-01 | Disposition: A | Discharge status: Home / Self Care | Payer: Medicare Other | Source: Ambulatory Visit | Attending: Vascular Surgery | Admitting: Vascular Surgery

## 2017-10-01 ENCOUNTER — Ambulatory Visit (INDEPENDENT_AMBULATORY_CARE_PROVIDER_SITE_OTHER): Payer: Medicare Other | Admitting: Vascular Surgery

## 2017-10-01 ENCOUNTER — Encounter: Payer: Self-pay | Admitting: Vascular Surgery

## 2017-10-01 ENCOUNTER — Other Ambulatory Visit: Payer: Self-pay

## 2017-10-01 ENCOUNTER — Ambulatory Visit (HOSPITAL_COMMUNITY)
Admission: RE | Admit: 2017-10-01 | Discharge: 2017-10-01 | Disposition: A | Discharge status: Home / Self Care | Payer: Medicare Other | Source: Ambulatory Visit | Attending: Vascular Surgery | Admitting: Vascular Surgery

## 2017-10-01 VITALS — BP 151/69 | HR 68 | Temp 97.3°F | Resp 20 | Ht 64.0 in | Wt 190.0 lb

## 2017-10-01 DIAGNOSIS — I639 Cerebral infarction, unspecified: Secondary | ICD-10-CM | POA: Insufficient documentation

## 2017-10-01 DIAGNOSIS — I70229 Atherosclerosis of native arteries of extremities with rest pain, unspecified extremity: Secondary | ICD-10-CM

## 2017-10-01 DIAGNOSIS — I739 Peripheral vascular disease, unspecified: Secondary | ICD-10-CM

## 2017-10-01 DIAGNOSIS — I6529 Occlusion and stenosis of unspecified carotid artery: Secondary | ICD-10-CM

## 2017-10-01 DIAGNOSIS — I6523 Occlusion and stenosis of bilateral carotid arteries: Secondary | ICD-10-CM | POA: Insufficient documentation

## 2017-10-01 DIAGNOSIS — I998 Other disorder of circulatory system: Secondary | ICD-10-CM | POA: Insufficient documentation

## 2017-10-01 DIAGNOSIS — I779 Disorder of arteries and arterioles, unspecified: Secondary | ICD-10-CM

## 2017-10-01 NOTE — Progress Notes (Signed)
Patient name: [name] [name] MRN: [id number] DOB: [birth year] Sex: female  REASON FOR VISIT:   Follow-up of peripheral vascular disease and carotid disease.  HPI:   [name] [name] is a pleasant 67 y.o. female who is undergone bilateral lower extremity bypasses.  In August 2017 she had a left common femoral artery endarterectomy with profundoplasty and vein patch angioplasty and left femoral to deep femoral artery bypass using left great saphenous vein.  In May 2017 she had a left femoral to below-knee pop bypass with a vein graft.  In September 2018 she had a right common femoral artery and external iliac artery endarterectomy with profundoplasty using a bovine pericardial patch and a right femoral to below-knee popliteal artery bypass with vein.  She also required ray amputation of her right first second and third toes.  Her main complaint in her legs is swelling which she attributes to her congestive heart failure.  She has been elevating her legs but not cannot lie completely flat.  She does not complain of claudication although her activity is limited by her dyspnea on exertion.  The patient does have a history of a stroke in the remote past but has had no recent symptoms of stroke, TIAs, expressive or receptive aphasia, or amaurosis fugax.  She is on aspirin, Plavix, and a statin.  With respect to her carotid disease, she does have a known right internal carotid artery occlusion.  She was due for a one-year follow-up visit as we were following her left carotid artery closely.  Past Medical History:  Diagnosis Date  . Anemia 10/2015   Acute Blood Loss  . Arthritis    "feel like I have it all over" (08/28/2015)  . CHF (congestive heart failure) (HCC)   . Complication of anesthesia    DIFFICULT WAKING "only when I was smoking; no problems since I quit"  . Coronary artery disease   . Family history of adverse reaction to anesthesia    sister slow to wake up  . GERD (gastroesophageal reflux  disease)    takes Protonix daily   . Hip bursitis   . History of blood transfusion    10/2015  . Hyperlipidemia LDL goal < 70 06/28/2013   takes Atorvastatin daily  . Hypertension    takes Metoprolol and Imdur daily  . Malnutrition (HCC)   . Migraine    "none in a long time" (08/28/2015)  . Myocardial infarction (HCC) 2011  . Neuromuscular disorder (HCC)    DIABETIC NEUROPATHY  . Osteomyelitis (HCC) 2017   Left foot  . PAD (peripheral artery disease) (HCC)   . Peripheral vascular disease (HCC)   . Respiratory failure (HCC) 10/2015   Acute Hypoxia- acute pulmonary edema 11/13/2015  . Septic shock (HCC) 10/2015  . Stroke (HCC)   . Type II diabetes mellitus (HCC)    takes Lantus nightly.Average fasting blood sugar runs 80-90  Type II    Family History  Problem Relation Age of Onset  . Hypertension Mother   . Heart failure Mother   . Heart failure Father   . Stroke Father   . Diabetes Father     SOCIAL HISTORY: Social History   Tobacco Use  . Smoking status: Former Smoker    Packs/day: 1.50    Years: 41.00    Pack years: 61.50    Types: Cigarettes    Last attempt to quit: 10/12/2009    Years since quitting: 7.9  . Smokeless tobacco: Never Used  Substance Use  Topics  . Alcohol use: No    Allergies  Allergen Reactions  . Doxycycline Other (See Comments)    lethargy  . Hydrochlorothiazide Other (See Comments)    lethargic   . Latex Rash  . Penicillins Swelling and Rash    Pt states she has tolerated Keflex in the past without problems. States she may have tolerated Augmentin in the past but it caused GI upset. Has patient had a PCN reaction causing immediate rash, facial/tongue/throat swelling, SOB or lightheadedness with hypotension: Yes Has patient had a PCN reaction causing severe rash involving mucus membranes or skin necrosis: No Has patient had a PCN reaction that required hospitalization No Has patient had a PCN reaction occurring within the last 10  years: No    Current Outpatient Medications  Medication Sig Dispense Refill  . amLODipine (NORVASC) 5 MG tablet TAKE 1 TABLET(5 MG) BY MOUTH DAILY 30 tablet 0  . aspirin EC 81 MG tablet Take 81 mg by mouth daily.    Marland Kitchen atorvastatin (LIPITOR) 80 MG tablet Take 80 mg by mouth. Take one tablet daily for hyperlipidemia    . clopidogrel (PLAVIX) 75 MG tablet Take 75 mg by mouth. Take one tablet daily for PVD    . furosemide (LASIX) 40 MG tablet Take 40 mg by mouth daily.     Marland Kitchen gabapentin (NEURONTIN) 300 MG capsule Take 300 mg by mouth 3 (three) times daily.     . isosorbide mononitrate (IMDUR) 30 MG 24 hr tablet 30 mg. Take 1 tablet along by mouth with 60 mg to equal 90 mg daily.    . isosorbide mononitrate (IMDUR) 60 MG 24 hr tablet 60 mg. Take 1 tablet by mouth along with 30 mg to equal 90 mg daily    . LEVEMIR FLEXTOUCH 100 UNIT/ML Pen INJECT 30 UNITS INTO THE SKIN HS UTD. DISCONTINUE LANTUS  3  . losartan (COZAAR) 100 MG tablet TAKE 1 TABLET BY MOUTH EVERY DAY 90 tablet 1  . metFORMIN (GLUCOPHAGE) 1000 MG tablet Take 1,000 mg by mouth 2 (two) times daily with a meal.    . metoprolol succinate (TOPROL-XL) 50 MG 24 hr tablet TAKE 1 AND 1/2 TABLETS BY MOUTH DAILY( NEW DOSE) 45 tablet 2  . pantoprazole (PROTONIX) 40 MG tablet TAKE 1 TABLET BY MOUTH DAILY 90 tablet 3  . potassium chloride (K-DUR,KLOR-CON) 10 MEQ tablet Take 10 mEq by mouth daily.      No current facility-administered medications for this visit.     REVIEW OF SYSTEMS:  [X]  denotes positive finding, [ ]  denotes negative finding Cardiac  Comments:  Chest pain or chest pressure:    Shortness of breath upon exertion: x   Short of breath when lying flat:    Irregular heart rhythm: x       Vascular    Pain in calf, thigh, or hip brought on by ambulation:    Pain in feet at night that wakes you up from your sleep:     Blood clot in your veins:    Leg swelling:  x       Pulmonary    Oxygen at home:    Productive cough:       Wheezing:         Neurologic    Sudden weakness in arms or legs:     Sudden numbness in arms or legs:     Sudden onset of difficulty speaking or slurred speech:    Temporary loss of vision in one  eye:     Problems with dizziness:         Gastrointestinal    Blood in stool:     Vomited blood:         Genitourinary    Burning when urinating:     Blood in urine:        Psychiatric    Major depression:         Hematologic    Bleeding problems:    Problems with blood clotting too easily:        Skin    Rashes or ulcers:        Constitutional    Fever or chills:     PHYSICAL EXAM:   Vitals:   10/01/17 1606 10/01/17 1609  BP: 132/70 (!) 151/69  Pulse: 68   Resp: 20   Temp: (!) 97.3 F (36.3 C)   TempSrc: Oral   SpO2: 95%   Weight: 190 lb (86.2 kg)   Height: 5\' 4"  (1.626 m)     GENERAL: The patient is a well-nourished female, in no acute distress. The vital signs are documented above. CARDIAC: There is a regular rate and rhythm.  VASCULAR: She has bilateral carotid bruits. She has moderate to severe bilateral lower extremity swelling. She does have some hyperpigmentation bilaterally. PULMONARY: There is good air exchange bilaterally without wheezing or rales. ABDOMEN: Soft and non-tender with normal pitched bowel sounds.  MUSCULOSKELETAL: Her toe amputation sites on the right foot are healed. NEUROLOGIC: No focal weakness or paresthesias are detected. SKIN: There are no ulcers or rashes noted. PSYCHIATRIC: The patient has a normal affect.  DATA:    CAROTID DUPLEX: I have independently interpreted her carotid duplex and today.  She does have a known right internal carotid artery occlusion.  On the left side she has a less than 39% ICA stenosis.  She does have a greater than 50% external carotid artery stenosis which likely explains her bruit.  LOWER EXTREMITY ARTERIAL DOPPLER STUDY: I have independently interpreted her lower extremity arterial Doppler  study.  On the right side she has a monophasic anterior tibial signal with a biphasic posterior tibial signal.  ABI is 92%.  On the left side she has a monophasic anterior tibial signal and posterior tibial signal.  ABI is 83%.  Toe pressure on the left is 117 mmHg.  GRAFT DUPLEX: I have independently interpreted her graft duplex scans today.  Both bypass grafts are patent with no areas of significant stenosis noted.  MEDICAL ISSUES:   STATUS POST BILATERAL LOWER EXTREMITY BYPASSES: Her bypass grafts are patent.  She has no significant arterial symptoms.  She does have significant swelling but cannot lie completely flat because of her heart failure so it is difficult for her to elevate her legs effectively.  I also encouraged her to try not to sit too much as I think she spends a fair amount of time sitting in her wheelchair.  I encouraged her to try to stay as active as possible.  I have ordered follow-up ABIs and graft duplex scans in 1 year.  I will see her back at that time.  She knows to call sooner if she has problems.  BILATERAL CAROTID DISEASE: Patient is asymptomatic.  She has a known right internal carotid artery occlusion.  She has no significant stenosis on the left.  Given the occlusion on the right however I think we will continue to follow the left side closely and I have ordered a follow-up carotid  duplex scan in 1 year and I will see her back at that time.  She knows to call sooner if she has problems.  She is on aspirin, Plavix, and a statin.  CHRONIC VENOUS INSUFFICIENCY: She does have CEAP clinical class 4a venous disease.  We have discussed the importance of intermittent leg elevation. It would be hard for her to get on compression stockings.   Waverly Ferrari [facility] [phone]

## 2017-11-19 ENCOUNTER — Other Ambulatory Visit: Payer: Self-pay | Admitting: Cardiovascular Disease

## 2017-11-26 ENCOUNTER — Ambulatory Visit (INDEPENDENT_AMBULATORY_CARE_PROVIDER_SITE_OTHER): Payer: Medicare Other | Admitting: Podiatry

## 2017-11-26 DIAGNOSIS — L84 Corns and callosities: Secondary | ICD-10-CM | POA: Diagnosis not present

## 2017-11-26 DIAGNOSIS — M79674 Pain in right toe(s): Secondary | ICD-10-CM

## 2017-11-26 DIAGNOSIS — I10 Essential (primary) hypertension: Secondary | ICD-10-CM | POA: Insufficient documentation

## 2017-11-26 DIAGNOSIS — M79675 Pain in left toe(s): Secondary | ICD-10-CM | POA: Diagnosis not present

## 2017-11-26 DIAGNOSIS — B351 Tinea unguium: Secondary | ICD-10-CM

## 2017-11-26 DIAGNOSIS — E1151 Type 2 diabetes mellitus with diabetic peripheral angiopathy without gangrene: Secondary | ICD-10-CM | POA: Diagnosis not present

## 2017-11-26 DIAGNOSIS — S98131A Complete traumatic amputation of one right lesser toe, initial encounter: Secondary | ICD-10-CM

## 2017-11-26 NOTE — Patient Instructions (Signed)

## 2017-12-14 ENCOUNTER — Encounter: Payer: Self-pay | Admitting: Podiatry

## 2017-12-14 NOTE — Progress Notes (Signed)
Subjective: [name] [name] presents today for diabetic foot care follow up. She presents with h/o diabetes and severe PAD.  She has h/o amputation of digits 1-3 on the right foot as well as I&D of the left plantar midfoot which left a residual callus.  Objective: Vascular Examination: Capillary refill time <3 seconds x 7 digits Dorsalis pedis pulses diminished b/l Posterior tibial pulses diminished b/l No digital hair x 7 Skin temperature warm to warm b/l  Dermatological Examination: Skin slightly atrophic b/l  Toenails 1-5 left, 4, 5 right discolored, thick, dystrophic with subungual debris and pain with palpation to nailbeds due to thickness of nails.  Calloused area noted left plantar midfoot   Musculoskeletal: Muscle strength 5/5 to all LE muscle groups  Neurological: Sensation diminished b/l  Assessment: 1. Painful onychomycosis toenails 1-5 left, 4, 5 right 2. Callus plantar left midfoot scar 3. NIDDM with Peripheral arterial disease 4. S/p Amputation digits 1, 2, 3 right foot  Plan: 1. Callus debrided left midfoot without incident. Patient qualifies for diabetic shoes based on the following diagnoses: NIDDM with PAD, preulcerative callus plantar left midfoot, amputation of right foot digits 1, 2, 3.  She will need filler for right foot. 2. Continue diabetic foot care principles with literature dispensed to patient. 3. Toenails 1-5 b/l were debrided in length and girth without iatrogenic bleeding. 4. Patient to continue soft, supportive shoe gear 5. Patient to report any pedal injuries to medical professional  6. Follow up 10 weeks.  Patient/POA to call should there be a concern in the interim.

## 2018-01-28 DIAGNOSIS — R0902 Hypoxemia: Secondary | ICD-10-CM

## 2018-01-28 HISTORY — DX: Hypoxemia: R09.02

## 2018-02-13 ENCOUNTER — Other Ambulatory Visit: Payer: Self-pay

## 2018-02-13 ENCOUNTER — Inpatient Hospital Stay (HOSPITAL_COMMUNITY): Payer: Medicare Other

## 2018-02-13 ENCOUNTER — Emergency Department (HOSPITAL_COMMUNITY): Payer: Medicare Other

## 2018-02-13 ENCOUNTER — Inpatient Hospital Stay (HOSPITAL_COMMUNITY)
Admission: EM | Admit: 2018-02-13 | Discharge: 2018-02-21 | DRG: 291 | Disposition: A | Discharge status: Home / Self Care | Payer: Medicare Other | Attending: Family Medicine | Admitting: Family Medicine

## 2018-02-13 ENCOUNTER — Encounter (HOSPITAL_COMMUNITY): Payer: Self-pay | Admitting: Emergency Medicine

## 2018-02-13 DIAGNOSIS — I5032 Chronic diastolic (congestive) heart failure: Secondary | ICD-10-CM

## 2018-02-13 DIAGNOSIS — J441 Chronic obstructive pulmonary disease with (acute) exacerbation: Secondary | ICD-10-CM | POA: Diagnosis present

## 2018-02-13 DIAGNOSIS — N183 Chronic kidney disease, stage 3 (moderate): Secondary | ICD-10-CM | POA: Diagnosis present

## 2018-02-13 DIAGNOSIS — Z7902 Long term (current) use of antithrombotics/antiplatelets: Secondary | ICD-10-CM

## 2018-02-13 DIAGNOSIS — N179 Acute kidney failure, unspecified: Secondary | ICD-10-CM | POA: Diagnosis present

## 2018-02-13 DIAGNOSIS — Z881 Allergy status to other antibiotic agents status: Secondary | ICD-10-CM

## 2018-02-13 DIAGNOSIS — R0600 Dyspnea, unspecified: Secondary | ICD-10-CM | POA: Diagnosis not present

## 2018-02-13 DIAGNOSIS — Z955 Presence of coronary angioplasty implant and graft: Secondary | ICD-10-CM

## 2018-02-13 DIAGNOSIS — N289 Disorder of kidney and ureter, unspecified: Secondary | ICD-10-CM | POA: Diagnosis not present

## 2018-02-13 DIAGNOSIS — R0989 Other specified symptoms and signs involving the circulatory and respiratory systems: Secondary | ICD-10-CM | POA: Diagnosis not present

## 2018-02-13 DIAGNOSIS — R000 Tachycardia, unspecified: Secondary | ICD-10-CM | POA: Diagnosis not present

## 2018-02-13 DIAGNOSIS — R609 Edema, unspecified: Secondary | ICD-10-CM | POA: Diagnosis not present

## 2018-02-13 DIAGNOSIS — E1122 Type 2 diabetes mellitus with diabetic chronic kidney disease: Secondary | ICD-10-CM | POA: Diagnosis present

## 2018-02-13 DIAGNOSIS — Z89422 Acquired absence of other left toe(s): Secondary | ICD-10-CM

## 2018-02-13 DIAGNOSIS — E118 Type 2 diabetes mellitus with unspecified complications: Secondary | ICD-10-CM | POA: Diagnosis not present

## 2018-02-13 DIAGNOSIS — E875 Hyperkalemia: Secondary | ICD-10-CM | POA: Diagnosis present

## 2018-02-13 DIAGNOSIS — Z9114 Patient's other noncompliance with medication regimen: Secondary | ICD-10-CM

## 2018-02-13 DIAGNOSIS — E1140 Type 2 diabetes mellitus with diabetic neuropathy, unspecified: Secondary | ICD-10-CM | POA: Diagnosis present

## 2018-02-13 DIAGNOSIS — Z794 Long term (current) use of insulin: Secondary | ICD-10-CM

## 2018-02-13 DIAGNOSIS — R0603 Acute respiratory distress: Secondary | ICD-10-CM | POA: Diagnosis not present

## 2018-02-13 DIAGNOSIS — Z8619 Personal history of other infectious and parasitic diseases: Secondary | ICD-10-CM

## 2018-02-13 DIAGNOSIS — I10 Essential (primary) hypertension: Secondary | ICD-10-CM | POA: Diagnosis present

## 2018-02-13 DIAGNOSIS — I5043 Acute on chronic combined systolic (congestive) and diastolic (congestive) heart failure: Secondary | ICD-10-CM | POA: Diagnosis present

## 2018-02-13 DIAGNOSIS — J121 Respiratory syncytial virus pneumonia: Secondary | ICD-10-CM | POA: Diagnosis present

## 2018-02-13 DIAGNOSIS — B974 Respiratory syncytial virus as the cause of diseases classified elsewhere: Secondary | ICD-10-CM | POA: Diagnosis present

## 2018-02-13 DIAGNOSIS — E1151 Type 2 diabetes mellitus with diabetic peripheral angiopathy without gangrene: Secondary | ICD-10-CM | POA: Diagnosis present

## 2018-02-13 DIAGNOSIS — K219 Gastro-esophageal reflux disease without esophagitis: Secondary | ICD-10-CM | POA: Diagnosis present

## 2018-02-13 DIAGNOSIS — J9601 Acute respiratory failure with hypoxia: Secondary | ICD-10-CM | POA: Diagnosis not present

## 2018-02-13 DIAGNOSIS — Z95820 Peripheral vascular angioplasty status with implants and grafts: Secondary | ICD-10-CM

## 2018-02-13 DIAGNOSIS — IMO0002 Reserved for concepts with insufficient information to code with codable children: Secondary | ICD-10-CM | POA: Diagnosis present

## 2018-02-13 DIAGNOSIS — I5023 Acute on chronic systolic (congestive) heart failure: Secondary | ICD-10-CM | POA: Diagnosis not present

## 2018-02-13 DIAGNOSIS — Z8673 Personal history of transient ischemic attack (TIA), and cerebral infarction without residual deficits: Secondary | ICD-10-CM

## 2018-02-13 DIAGNOSIS — D649 Anemia, unspecified: Secondary | ICD-10-CM | POA: Diagnosis present

## 2018-02-13 DIAGNOSIS — Z87891 Personal history of nicotine dependence: Secondary | ICD-10-CM

## 2018-02-13 DIAGNOSIS — Z9841 Cataract extraction status, right eye: Secondary | ICD-10-CM

## 2018-02-13 DIAGNOSIS — I2510 Atherosclerotic heart disease of native coronary artery without angina pectoris: Secondary | ICD-10-CM | POA: Diagnosis not present

## 2018-02-13 DIAGNOSIS — Z823 Family history of stroke: Secondary | ICD-10-CM

## 2018-02-13 DIAGNOSIS — Z9049 Acquired absence of other specified parts of digestive tract: Secondary | ICD-10-CM

## 2018-02-13 DIAGNOSIS — E785 Hyperlipidemia, unspecified: Secondary | ICD-10-CM | POA: Diagnosis present

## 2018-02-13 DIAGNOSIS — Z961 Presence of intraocular lens: Secondary | ICD-10-CM | POA: Diagnosis present

## 2018-02-13 DIAGNOSIS — I130 Hypertensive heart and chronic kidney disease with heart failure and stage 1 through stage 4 chronic kidney disease, or unspecified chronic kidney disease: Secondary | ICD-10-CM | POA: Diagnosis not present

## 2018-02-13 DIAGNOSIS — Z79899 Other long term (current) drug therapy: Secondary | ICD-10-CM

## 2018-02-13 DIAGNOSIS — L919 Hypertrophic disorder of the skin, unspecified: Secondary | ICD-10-CM | POA: Diagnosis present

## 2018-02-13 DIAGNOSIS — I252 Old myocardial infarction: Secondary | ICD-10-CM | POA: Diagnosis not present

## 2018-02-13 DIAGNOSIS — Z7982 Long term (current) use of aspirin: Secondary | ICD-10-CM

## 2018-02-13 DIAGNOSIS — R0902 Hypoxemia: Secondary | ICD-10-CM | POA: Diagnosis not present

## 2018-02-13 DIAGNOSIS — E1165 Type 2 diabetes mellitus with hyperglycemia: Secondary | ICD-10-CM | POA: Diagnosis present

## 2018-02-13 DIAGNOSIS — Z880 Allergy status to penicillin: Secondary | ICD-10-CM

## 2018-02-13 DIAGNOSIS — I5021 Acute systolic (congestive) heart failure: Secondary | ICD-10-CM | POA: Diagnosis present

## 2018-02-13 DIAGNOSIS — I454 Nonspecific intraventricular block: Secondary | ICD-10-CM | POA: Diagnosis present

## 2018-02-13 DIAGNOSIS — Z90710 Acquired absence of both cervix and uterus: Secondary | ICD-10-CM

## 2018-02-13 DIAGNOSIS — Z833 Family history of diabetes mellitus: Secondary | ICD-10-CM

## 2018-02-13 DIAGNOSIS — I110 Hypertensive heart disease with heart failure: Secondary | ICD-10-CM | POA: Diagnosis not present

## 2018-02-13 DIAGNOSIS — Z91040 Latex allergy status: Secondary | ICD-10-CM

## 2018-02-13 DIAGNOSIS — R0689 Other abnormalities of breathing: Secondary | ICD-10-CM | POA: Diagnosis not present

## 2018-02-13 DIAGNOSIS — J440 Chronic obstructive pulmonary disease with acute lower respiratory infection: Secondary | ICD-10-CM | POA: Diagnosis present

## 2018-02-13 DIAGNOSIS — R0602 Shortness of breath: Secondary | ICD-10-CM | POA: Diagnosis not present

## 2018-02-13 DIAGNOSIS — T380X5A Adverse effect of glucocorticoids and synthetic analogues, initial encounter: Secondary | ICD-10-CM | POA: Diagnosis not present

## 2018-02-13 DIAGNOSIS — Z888 Allergy status to other drugs, medicaments and biological substances status: Secondary | ICD-10-CM

## 2018-02-13 DIAGNOSIS — I739 Peripheral vascular disease, unspecified: Secondary | ICD-10-CM | POA: Diagnosis present

## 2018-02-13 DIAGNOSIS — A419 Sepsis, unspecified organism: Secondary | ICD-10-CM | POA: Diagnosis not present

## 2018-02-13 DIAGNOSIS — Z8249 Family history of ischemic heart disease and other diseases of the circulatory system: Secondary | ICD-10-CM

## 2018-02-13 DIAGNOSIS — Z89412 Acquired absence of left great toe: Secondary | ICD-10-CM

## 2018-02-13 DIAGNOSIS — N1832 Chronic kidney disease, stage 3b: Secondary | ICD-10-CM

## 2018-02-13 DIAGNOSIS — J181 Lobar pneumonia, unspecified organism: Secondary | ICD-10-CM | POA: Diagnosis not present

## 2018-02-13 HISTORY — DX: Hypoxemia: R09.02

## 2018-02-13 LAB — COMPREHENSIVE METABOLIC PANEL
ALT: 15 U/L (ref 0–44)
AST: 39 U/L (ref 15–41)
Albumin: 3.1 g/dL — ABNORMAL LOW (ref 3.5–5.0)
Alkaline Phosphatase: 93 U/L (ref 38–126)
Anion gap: 12 (ref 5–15)
BUN: 33 mg/dL — ABNORMAL HIGH (ref 8–23)
CHLORIDE: 105 mmol/L (ref 98–111)
CO2: 19 mmol/L — ABNORMAL LOW (ref 22–32)
Calcium: 8.8 mg/dL — ABNORMAL LOW (ref 8.9–10.3)
Creatinine, Ser: 1.54 mg/dL — ABNORMAL HIGH (ref 0.44–1.00)
GFR calc non Af Amer: 35 mL/min — ABNORMAL LOW (ref >60)
GFR, EST AFRICAN AMERICAN: 40 mL/min — AB (ref >60)
Glucose, Bld: 203 mg/dL — ABNORMAL HIGH (ref 70–99)
Potassium: 5.6 mmol/L — ABNORMAL HIGH (ref 3.5–5.1)
Sodium: 136 mmol/L (ref 135–145)
Total Bilirubin: 1.2 mg/dL (ref 0.3–1.2)
Total Protein: 7 g/dL (ref 6.5–8.1)

## 2018-02-13 LAB — CBC WITH DIFFERENTIAL/PLATELET
Abs Immature Granulocytes: 0.04 10*3/uL (ref 0.00–0.07)
Basophils Absolute: 0.1 10*3/uL (ref 0.0–0.1)
Basophils Relative: 1 %
EOS PCT: 5 %
Eosinophils Absolute: 0.6 10*3/uL — ABNORMAL HIGH (ref 0.0–0.5)
HCT: 33.2 % — ABNORMAL LOW (ref 36.0–46.0)
Hemoglobin: 11.1 g/dL — ABNORMAL LOW (ref 12.0–15.0)
Immature Granulocytes: 0 %
Lymphocytes Relative: 9 %
Lymphs Abs: 1.2 10*3/uL (ref 0.7–4.0)
MCH: 29.1 pg (ref 26.0–34.0)
MCHC: 33.4 g/dL (ref 30.0–36.0)
MCV: 86.9 fL (ref 80.0–100.0)
Monocytes Absolute: 0.8 10*3/uL (ref 0.1–1.0)
Monocytes Relative: 6 %
Neutro Abs: 10.5 10*3/uL — ABNORMAL HIGH (ref 1.7–7.7)
Neutrophils Relative %: 79 %
PLATELETS: 288 10*3/uL (ref 150–400)
RBC: 3.82 MIL/uL — ABNORMAL LOW (ref 3.87–5.11)
RDW: 13.9 % (ref 11.5–15.5)
WBC: 13.3 10*3/uL — ABNORMAL HIGH (ref 4.0–10.5)
nRBC: 0 % (ref 0.0–0.2)

## 2018-02-13 LAB — GLUCOSE, CAPILLARY
GLUCOSE-CAPILLARY: 133 mg/dL — AB (ref 70–99)
GLUCOSE-CAPILLARY: 171 mg/dL — AB (ref 70–99)
Glucose-Capillary: 302 mg/dL — ABNORMAL HIGH (ref 70–99)
Glucose-Capillary: 140 mg/dL — ABNORMAL HIGH (ref 70–99)
Glucose-Capillary: 183 mg/dL — ABNORMAL HIGH (ref 70–99)

## 2018-02-13 LAB — BRAIN NATRIURETIC PEPTIDE: B Natriuretic Peptide: 411.9 pg/mL — ABNORMAL HIGH (ref 0.0–100.0)

## 2018-02-13 LAB — ECHOCARDIOGRAM COMPLETE

## 2018-02-13 LAB — POTASSIUM: Potassium: 5.8 mmol/L — ABNORMAL HIGH (ref 3.5–5.1)

## 2018-02-13 LAB — TROPONIN I: Troponin I: <0.03 ng/mL (ref <0.03)

## 2018-02-13 LAB — CBG MONITORING, ED: Glucose-Capillary: 207 mg/dL — ABNORMAL HIGH (ref 70–99)

## 2018-02-13 MED ORDER — SODIUM CHLORIDE 0.9% FLUSH
3.0000 mL | INTRAVENOUS | Status: DC | PRN
  Administered 2018-02-17: 3 mL via INTRAVENOUS
  Filled 2018-02-13: qty 3

## 2018-02-13 MED ORDER — ISOSORBIDE MONONITRATE ER 30 MG PO TB24: 30.0000 mg | ORAL_TABLET | ORAL | Status: DC

## 2018-02-13 MED ORDER — HEPARIN SODIUM (PORCINE) 5000 UNIT/ML IJ SOLN
5000.0000 [IU] | Freq: Three times a day (TID) | INTRAMUSCULAR | Status: DC
  Administered 2018-02-13 – 2018-02-20 (×21): 5000 [IU] via SUBCUTANEOUS
  Filled 2018-02-13 (×23): qty 1

## 2018-02-13 MED ORDER — NITROGLYCERIN IN D5W 200-5 MCG/ML-% IV SOLN
0.0000 ug/min | INTRAVENOUS | Status: DC
  Administered 2018-02-13: 5 ug/min via INTRAVENOUS
  Filled 2018-02-13: qty 250

## 2018-02-13 MED ORDER — GABAPENTIN 300 MG PO CAPS
300.0000 mg | ORAL_CAPSULE | Freq: Three times a day (TID) | ORAL | Status: DC
  Administered 2018-02-13 – 2018-02-21 (×25): 300 mg via ORAL
  Filled 2018-02-13 (×26): qty 1

## 2018-02-13 MED ORDER — ISOSORBIDE MONONITRATE ER 60 MG PO TB24
90.0000 mg | ORAL_TABLET | Freq: Every day | ORAL | Status: DC
  Administered 2018-02-13: 90 mg via ORAL
  Filled 2018-02-13: qty 1

## 2018-02-13 MED ORDER — ACETAMINOPHEN 325 MG PO TABS
650.0000 mg | ORAL_TABLET | ORAL | Status: DC | PRN
  Administered 2018-02-13 – 2018-02-15 (×2): 650 mg via ORAL
  Filled 2018-02-13 (×2): qty 2

## 2018-02-13 MED ORDER — ASPIRIN EC 81 MG PO TBEC
81.0000 mg | DELAYED_RELEASE_TABLET | Freq: Every day | ORAL | Status: DC
  Administered 2018-02-13 – 2018-02-21 (×9): 81 mg via ORAL
  Filled 2018-02-13 (×9): qty 1

## 2018-02-13 MED ORDER — INFLUENZA VAC SPLIT HIGH-DOSE 0.5 ML IM SUSY: 0.5000 mL | PREFILLED_SYRINGE | INTRAMUSCULAR | Status: DC | PRN

## 2018-02-13 MED ORDER — SODIUM POLYSTYRENE SULFONATE 15 GM/60ML PO SUSP
30.0000 g | Freq: Once | ORAL | Status: DC
  Filled 2018-02-13: qty 120

## 2018-02-13 MED ORDER — PANTOPRAZOLE SODIUM 40 MG PO TBEC
40.0000 mg | DELAYED_RELEASE_TABLET | Freq: Every day | ORAL | Status: DC
  Administered 2018-02-13 – 2018-02-21 (×8): 40 mg via ORAL
  Filled 2018-02-13 (×8): qty 1

## 2018-02-13 MED ORDER — FUROSEMIDE 10 MG/ML IJ SOLN: 40.0000 mg | Freq: Two times a day (BID) | INTRAMUSCULAR | Status: DC

## 2018-02-13 MED ORDER — ORAL CARE MOUTH RINSE
15.0000 mL | Freq: Two times a day (BID) | OROMUCOSAL | Status: DC
  Administered 2018-02-14 – 2018-02-21 (×9): 15 mL via OROMUCOSAL

## 2018-02-13 MED ORDER — HYDRALAZINE HCL 20 MG/ML IJ SOLN
5.0000 mg | Freq: Three times a day (TID) | INTRAMUSCULAR | Status: DC
  Administered 2018-02-13: 5 mg via INTRAVENOUS
  Filled 2018-02-13 (×4): qty 1

## 2018-02-13 MED ORDER — HYDRALAZINE HCL 20 MG/ML IJ SOLN: 10.0000 mg | INTRAMUSCULAR | Status: DC | PRN

## 2018-02-13 MED ORDER — FUROSEMIDE 10 MG/ML IJ SOLN
60.0000 mg | Freq: Two times a day (BID) | INTRAMUSCULAR | Status: DC
  Administered 2018-02-13: 60 mg via INTRAVENOUS
  Filled 2018-02-13: qty 6

## 2018-02-13 MED ORDER — CHLORHEXIDINE GLUCONATE 0.12 % MT SOLN
15.0000 mL | Freq: Two times a day (BID) | OROMUCOSAL | Status: DC
  Administered 2018-02-13 – 2018-02-21 (×16): 15 mL via OROMUCOSAL
  Filled 2018-02-13 (×16): qty 15

## 2018-02-13 MED ORDER — AMLODIPINE BESYLATE 5 MG PO TABS
5.0000 mg | ORAL_TABLET | Freq: Every day | ORAL | Status: DC
  Administered 2018-02-13 – 2018-02-14 (×2): 5 mg via ORAL
  Filled 2018-02-13 (×2): qty 1

## 2018-02-13 MED ORDER — HYDRALAZINE HCL 20 MG/ML IJ SOLN: 10.0000 mg | Freq: Three times a day (TID) | INTRAMUSCULAR | Status: DC

## 2018-02-13 MED ORDER — INSULIN GLARGINE 100 UNIT/ML ~~LOC~~ SOLN
8.0000 [IU] | Freq: Every day | SUBCUTANEOUS | Status: DC
  Administered 2018-02-13 – 2018-02-15 (×3): 8 [IU] via SUBCUTANEOUS
  Filled 2018-02-13 (×3): qty 0.08

## 2018-02-13 MED ORDER — SODIUM CHLORIDE 0.9% FLUSH
3.0000 mL | Freq: Two times a day (BID) | INTRAVENOUS | Status: DC
  Administered 2018-02-13 – 2018-02-20 (×13): 3 mL via INTRAVENOUS

## 2018-02-13 MED ORDER — METOPROLOL SUCCINATE ER 50 MG PO TB24
75.0000 mg | ORAL_TABLET | Freq: Every day | ORAL | Status: DC
  Administered 2018-02-13 – 2018-02-21 (×8): 75 mg via ORAL
  Filled 2018-02-13 (×8): qty 1

## 2018-02-13 MED ORDER — ATORVASTATIN CALCIUM 80 MG PO TABS
80.0000 mg | ORAL_TABLET | Freq: Every day | ORAL | Status: DC
  Administered 2018-02-13 – 2018-02-21 (×9): 80 mg via ORAL
  Filled 2018-02-13 (×9): qty 1

## 2018-02-13 MED ORDER — SODIUM CHLORIDE 0.9 % IV SOLN
250.0000 mL | INTRAVENOUS | Status: DC | PRN
  Administered 2018-02-19 – 2018-02-20 (×2): 250 mL via INTRAVENOUS

## 2018-02-13 MED ORDER — FUROSEMIDE 10 MG/ML IJ SOLN
60.0000 mg | Freq: Once | INTRAMUSCULAR | Status: AC
  Administered 2018-02-13: 60 mg via INTRAVENOUS
  Filled 2018-02-13: qty 6

## 2018-02-13 MED ORDER — INSULIN ASPART 100 UNIT/ML ~~LOC~~ SOLN
0.0000 [IU] | SUBCUTANEOUS | Status: DC
  Administered 2018-02-13: 3 [IU] via SUBCUTANEOUS
  Administered 2018-02-13: 2 [IU] via SUBCUTANEOUS
  Administered 2018-02-13: 7 [IU] via SUBCUTANEOUS
  Administered 2018-02-13 – 2018-02-14 (×3): 2 [IU] via SUBCUTANEOUS
  Administered 2018-02-14: 1 [IU] via SUBCUTANEOUS
  Administered 2018-02-14: 2 [IU] via SUBCUTANEOUS
  Administered 2018-02-14: 3 [IU] via SUBCUTANEOUS
  Administered 2018-02-14 – 2018-02-15 (×2): 1 [IU] via SUBCUTANEOUS
  Administered 2018-02-15: 7 [IU] via SUBCUTANEOUS
  Administered 2018-02-15: 1 [IU] via SUBCUTANEOUS
  Administered 2018-02-15: 5 [IU] via SUBCUTANEOUS
  Administered 2018-02-16 (×2): 7 [IU] via SUBCUTANEOUS
  Administered 2018-02-16: 5 [IU] via SUBCUTANEOUS

## 2018-02-13 MED ORDER — ONDANSETRON HCL 4 MG/2ML IJ SOLN: 4.0000 mg | Freq: Four times a day (QID) | INTRAMUSCULAR | Status: DC | PRN

## 2018-02-13 MED ORDER — CLOPIDOGREL BISULFATE 75 MG PO TABS
75.0000 mg | ORAL_TABLET | Freq: Every day | ORAL | Status: DC
  Administered 2018-02-13 – 2018-02-21 (×9): 75 mg via ORAL
  Filled 2018-02-13 (×9): qty 1

## 2018-02-13 NOTE — Progress Notes (Signed)
RT set up BIPAP and placed on patient. Patient tolerating well at this time with improvement to respiratory rate and WOB. RT will continue to monitor.

## 2018-02-13 NOTE — ED Triage Notes (Signed)
Patient is from home, respiratory distress had audible rales upon ems entering home.  Patient takes lasix intermittently.  Patient was given 2 sl nitros before CPAP placed.  CPAP placed and patient had some relief of the distress.

## 2018-02-13 NOTE — ED Provider Notes (Signed)
Emergency Department Provider Note   I have reviewed the triage vital signs and the nursing notes.   HISTORY  Chief Complaint Respiratory Distress   HPI [name] [name] is a 68 y.o. female with multiple medical problems that presents to the emergency room in acute respiratory distress.  EMS provides most the history patient is a worsening shortness of breath the last couple weeks but has not been compliant with her Lasix.  She has significantly worse today and EMS was called.  On arrival patient was immediately put on BiPAP but she had bilateral rales, respiratory distress, tachycardia and hypertension.  Her initial oxygenation on the BiPAP was 87 and 88%.  Patient states that she feels little bit better with the BiPAP now but not significantly so.  She has worsening lower extremity edema.  No fever but has had a cough.  No rashes. No chest pain.  No other associated or modifying symptoms.   Level V Caveat 2/2 acuity of symptoms.  Past Medical History:  Diagnosis Date  . Anemia 10/2015   Acute Blood Loss  . Arthritis    "feel like I have it all over" (08/28/2015)  . CHF (congestive heart failure) (HCC)   . Complication of anesthesia    DIFFICULT WAKING "only when I was smoking; no problems since I quit"  . Coronary artery disease   . Family history of adverse reaction to anesthesia    sister slow to wake up  . GERD (gastroesophageal reflux disease)    takes Protonix daily   . Hip bursitis   . History of blood transfusion    10/2015  . Hyperlipidemia LDL goal < 70 06/28/2013   takes Atorvastatin daily  . Hypertension    takes Metoprolol and Imdur daily  . Malnutrition (HCC)   . Migraine    "none in a long time" (08/28/2015)  . Myocardial infarction (HCC) 2011  . Neuromuscular disorder (HCC)    DIABETIC NEUROPATHY  . Osteomyelitis (HCC) 2017   Left foot  . PAD (peripheral artery disease) (HCC)   . Peripheral vascular disease (HCC)   . Respiratory failure (HCC) 10/2015   Acute Hypoxia- acute pulmonary edema 11/13/2015  . Septic shock (HCC) 10/2015  . Stroke (HCC)   . Type II diabetes mellitus (HCC)    takes Lantus nightly.Average fasting blood sugar runs 80-90  Type II    Patient Active Problem List   Diagnosis Date Noted  . Benign hypertension 11/26/2017  . Gangrene of right foot (HCC) 11/01/2016  . GERD (gastroesophageal reflux disease) 11/01/2016  . Gangrene of left foot (HCC) 10/17/2016  . Sepsis (HCC) 10/17/2016  . Midfoot ulcer, left, limited to breakdown of skin (HCC) 01/08/2016  . Thigh hematoma 12/22/2015  . History of blood transfusion 12/22/2015  . Acute on chronic systolic CHF (congestive heart failure) (HCC) 12/22/2015  . H/O skin graft 12/11/2015  . NSTEMI (non-ST elevated myocardial infarction) (HCC) 11/19/2015  . DM type 2 with diabetic peripheral neuropathy (HCC) 11/19/2015  . Traumatic hemorrhagic shock (HCC)   . Chronic systolic CHF (congestive heart failure) (HCC)   . Uncontrolled type 2 diabetes mellitus with complication (HCC)   . Demand myocardial infarction (HCC) 11/08/2015  . Surgery, elective   . Central line infection, initial encounter   . Septic shock (HCC) 11/03/2015  . Encephalopathy acute 11/03/2015  . Acute blood loss as cause of postoperative anemia 11/03/2015  . Acute respiratory failure with hypoxia (HCC)   . CVA (cerebral vascular accident) (HCC)   .  S/P femoral-popliteal bypass surgery   . Malnutrition of moderate degree 11/01/2015  . Diabetic ulcer of left midfoot associated with diabetes mellitus due to underlying condition, with muscle involvement without evidence of necrosis (HCC)   . Hypertensive heart disease with chronic diastolic congestive heart failure (HCC) 10/31/2015  . Osteomyelitis of left foot (HCC) 10/30/2015  . PVD (peripheral vascular disease) with claudication (HCC) 09/28/2015  . Critical lower limb ischemia 08/28/2015  . Hyperlipidemia with target LDL less than 70 06/28/2013  . Diabetes  mellitus type II, uncontrolled (HCC) 03/03/2013  . Complicated migraine 03/03/2013  . Stroke-like symptom 03/02/2013  . Diabetes mellitus (HCC) 03/02/2013  . Aphasia 03/02/2013  . Headache(784.0) 03/02/2013  . PAD (peripheral artery disease) (HCC) 06/05/2011  . Coronary artery disease involving native coronary artery of native heart without angina pectoris 06/05/2011  . Hypotension 06/05/2011  . Claudication in peripheral vascular disease:  Lifestyle limiting. 06/05/2011  . Hx of tobacco use, presenting hazards to health 06/05/2011    Past Surgical History:  Procedure Laterality Date  . ABDOMINAL HYSTERECTOMY    . AMPUTATION Right 10/22/2016   Procedure: AMPUTATION DIGIT RIGHT TOES 1-3 POSSIBLE TRANSMETATARSAL;  Surgeon: Chuck Hint, MD;  Location: [facility] OR;  Service: Vascular;  Laterality: Right;  . APPENDECTOMY    . ATHERECTOMY N/A 06/04/2011   Procedure: ATHERECTOMY;  Surgeon: Runell Gess, MD;  Location: [facility] CATH LAB;  Service: Cardiovascular;  Laterality: N/A;  . CARDIAC CATHETERIZATION  10/13/2009   95% stenosis in the AV groove circumflex and 95% ostial stenosis in small OM3. A 3x64mm drug-eluting Promus stent inserted ito the circumflex. Dilatated with a 3.25x9mm noncompliant Quantum balloon within entire segment. The entire region was reduced to 0% and brisk TIMI3 flow.  . CAROTID DUPLEX  03/19/2011   Right ICA-demonstrates complete occlusion. Left ICA-demonstrates a small amount of fibrous plaque.  Marland Kitchen CATARACT EXTRACTION W/ INTRAOCULAR LENS IMPLANT Right   . CESAREAN SECTION  1990  . CORONARY ANGIOPLASTY    . ENDARTERECTOMY FEMORAL Left 09/05/2015   Procedure: ENDARTERECTOMY FEMORAL WITH PROFUNDOPLASTY;  Surgeon: Chuck Hint, MD;  Location: [facility] OR;  Service: Vascular;  Laterality: Left;  Left common femoral artery vein patch using left saphenous vien  . ENDARTERECTOMY FEMORAL Right 10/22/2016   Procedure: ENDARTERECTOMY RIGHT COMMON FEMORAL;  Surgeon: Chuck Hint, MD;  Location: [facility] OR;  Service: Vascular;  Laterality: Right;  . FEMORAL-POPLITEAL BYPASS GRAFT Left 11/02/2015   Procedure: BYPASS GRAFT FEMORAL-POPLITEAL ARTERY VS FEMORAL-TIBIAL ARTERY BYPASS;  Surgeon: Chuck Hint, MD;  Location: [facility] OR;  Service: Vascular;  Laterality: Left;  . FEMORAL-POPLITEAL BYPASS GRAFT Right 10/22/2016   Procedure: BYPASS GRAFT RIGHT FEMORAL- BELOW KNEE POPLITEAL ARTERY WITH VEIN;  Surgeon: Chuck Hint, MD;  Location: [facility] OR;  Service: Vascular;  Laterality: Right;  . I&D EXTREMITY Left 11/10/2015   Procedure: Debridement Left Foot Ulcer, Application  Wound VAC;  Surgeon: [name], MD;  Location: [facility] OR;  Service: Orthopedics;  Laterality: Left;  . ILIAC ARTERY STENT Left 08/28/2015   common  . INTRAOPERATIVE ARTERIOGRAM Left 09/05/2015   Procedure: INTRA OPERATIVE ARTERIOGRAM;  Surgeon: Chuck Hint, MD;  Location: [facility] OR;  Service: Vascular;  Laterality: Left;  . INTRAOPERATIVE ARTERIOGRAM Left 11/02/2015   Procedure: INTRA OPERATIVE ARTERIOGRAM;  Surgeon: Chuck Hint, MD;  Location: [facility] OR;  Service: Vascular;  Laterality: Left;  . INTRAOPERATIVE ARTERIOGRAM Right 10/22/2016   Procedure: INTRA OPERATIVE ARTERIOGRAM;  Surgeon: Chuck Hint, MD;  Location: [facility]  OR;  Service: Vascular;  Laterality: Right;  . LEXISCAN MYOVIEW  10/25/2010   Moderate perfusion defect due to infarct/scar with mild perinfarct ischemia seen in the Basal Inferolateral, Basal Anterolateral, Mid Inferolateral, and Mid Anterolateral regions. Post-stress EF is 50%.  . LOWER EXTREMITY ANGIOGRAPHY N/A 10/17/2016   Procedure: Lower Extremity Angiography;  Surgeon: Runell GessBerry, Jonathan J, MD;  [facility];  Service: Cardiovascular;  Laterality: N/A;  . OVARY SURGERY  1983?   "ruptured"  . PERIPHERAL VASCULAR ANGIOGRAM  01/26/2010   High-grade SFA disease: left greater than right. Left SFA would require fem-pop bypass grafting. Right  SFA could be stented but might require Diamondback Orbital atherectomy.  Marland Kitchen. PERIPHERAL VASCULAR ANGIOGRAM  02/23/2010   Stealth Predator orbital rotational atherectomy performed on SFA & Popliteal up to 90,000 RPM. Stenting using overlapping 5x16700mm and 5x7060mm Absolute Pro Nitinol self-expanding stents beginning just at the knee up to the mid SFA resulting in reduction of 90-95% calcified SFA & Popliteal stenosis to 0. Stenting performed on the distal common & proximal iliac artery with a 10x4 Absolute Pro- 70-0%.  Marland Kitchen. PERIPHERAL VASCULAR ANGIOGRAM  06/17/2010   PTA performed to the right external iliac artery stent using a 5x100 balloon at 10 atmospheres. Stenting performed using a 6x18 Genesis on Opta balloon. Postdilatation with a 7x2 balloon resulting in a 95% "in-stent" stenosis to 0% residual.  . PERIPHERAL VASCULAR ANGIOGRAM  06/04/2011   Bilateral total SFAs not percutaneously addressable. Good canidate for femoropopliteal bypass grafting  . PERIPHERAL VASCULAR ANGIOGRAM  08/28/2015  . PERIPHERAL VASCULAR CATHETERIZATION N/A 08/28/2015   Procedure: Lower Extremity Angiography;  Surgeon: Runell GessJonathan J Berry, MD;  [facility];  Service: Cardiovascular;  Laterality: N/A;  . PERIPHERAL VASCULAR CATHETERIZATION N/A 08/28/2015   Procedure: Abdominal Aortogram;  Surgeon: Runell GessJonathan J Berry, MD;  Location: [facility] INVASIVE CV LAB;  Service: Cardiovascular;  Laterality: N/A;  . PERIPHERAL VASCULAR CATHETERIZATION Left 08/28/2015   Procedure: Peripheral Vascular Intervention;  Surgeon: Runell GessJonathan J Berry, MD;  [facility];  Service: Cardiovascular;  Laterality: Left;  common iliac  . PERIPHERAL VASCULAR CATHETERIZATION Left 08/28/2015   Procedure: Peripheral Vascular Atherectomy;  Surgeon: Runell GessJonathan J Berry, MD;  [facility];  Service: Cardiovascular;  Laterality: Left;  common iliac  . PERIPHERAL VASCULAR CATHETERIZATION N/A 09/28/2015   Procedure: Lower Extremity  Angiography;  Surgeon: Runell GessJonathan J Berry, MD;  [facility];  Service: Cardiovascular;  Laterality: N/A;  . PERIPHERAL VASCULAR CATHETERIZATION Left 09/28/2015   Procedure: Peripheral Vascular Intervention;  Surgeon: Runell GessJonathan J Berry, MD;  [facility];  Service: Cardiovascular;  Laterality: Left CFA  PCI with 9 mm x 4 cm Abbott nitinol absolute Pro self-expanding stent     . SKIN SPLIT GRAFT Left 12/01/2015   Procedure: LEFT FOOT SKIN GRAFT AND VAC;  Surgeon: Nadara MustardMarcus V Duda, MD;  Location: [facility] OR;  Service: Orthopedics;  Laterality: Left;  . THROMBECTOMY FEMORAL ARTERY Right 10/22/2016   Procedure: THROMBECTOMY FEMORAL ARTERY;  Surgeon: Chuck Hintickson, Christopher S, MD;  [facility];  Service: Vascular;  Laterality: Right;  . TRANSTHORACIC ECHOCARDIOGRAM  10/17/2009   EF 45-50%, moderate hypokinesis of the entire inferolateral myocardium, mild concentric hypertrophy and mild regurg of the mitral valva.  Marland Kitchen. VEIN HARVEST Left 11/02/2015   Procedure: LEFT GREATER SAPHENOUS VEIN HARVEST;  Surgeon: Chuck Hinthristopher S Dickson, MD;  [facility];  Service: Vascular;  Laterality: Left;    Current Outpatient Rx  .  Order #: [id number] Class: Normal  . Order #: [id number] Class: Historical Med  . Order #: [id number] Class: Historical Med  . Order #: [id number] Class: Historical Med  . Order #: [id number] Class: Historical Med  . Order #: [id number] Class: Historical Med  . Order #: [id number] Class: Historical Med  . Order #: [id number] Class: Historical Med  . Order #: [id number] Class: Historical Med  . Order #: [id number] Class: Normal  . Order #: [id number] Class: Historical Med  . Order #: [id number] Class: Normal  . Order #: [id number] Class: Normal  . Order #: [id number] Class: Historical Med    Allergies Doxycycline; Hydrochlorothiazide; Latex; and Penicillins  Family History  Problem Relation Age of Onset  . Hypertension Mother   . Heart failure Mother   . Heart failure Father   . Stroke  Father   . Diabetes Father     Social History Social History   Tobacco Use  . Smoking status: Former Smoker    Packs/day: 1.50    Years: 41.00    Pack years: 61.50    Types: Cigarettes    Last attempt to quit: 10/12/2009    Years since quitting: 8.3  . Smokeless tobacco: Never Used  Substance Use Topics  . Alcohol use: No  . Drug use: No    Review of Systems  All other systems negative except as documented in the HPI. All pertinent positives and negatives as reviewed in the HPI. ____________________________________________   PHYSICAL EXAM:  VITAL SIGNS: ED Triage Vitals  Enc Vitals Group     BP 02/13/18 0143 136/78     Pulse Rate 02/13/18 0143 (!) 116     Resp 02/13/18 0143 (!) 34     Temp 02/13/18 0143 98.3 F (36.8 C)     Temp Source 02/13/18 0143 Oral     SpO2 02/13/18 0143 92 %    Constitutional: Alert and oriented. Well appearing and in moderate respiratory distress. Eyes: Conjunctivae are normal. PERRL. EOMI. Head: Atraumatic. Nose: No congestion/rhinnorhea. Mouth/Throat: Mucous membranes are moist.  Oropharynx non-erythematous. Neck: No stridor.  No meningeal signs.  JVD present Cardiovascular: tachycardic rate, regular rhythm. Good peripheral circulation. Grossly normal heart sounds.   Respiratory: tachypneic, hypoxic respiratory effort.  No retractions. Lungs with rales about midway up. Gastrointestinal: Soft and nontender. No distention.  Musculoskeletal: No lower extremity tenderness but significant edema. No gross deformities of extremities. Neurologic:  Normal speech and language. No gross focal neurologic deficits are appreciated.  Skin:  Skin is warm, dry and intact. No rash noted.   ____________________________________________   LABS (all labs ordered are listed, but only abnormal results are displayed)  Labs Reviewed  CBC WITH DIFFERENTIAL/PLATELET  COMPREHENSIVE METABOLIC PANEL  TROPONIN I  BRAIN NATRIURETIC PEPTIDE    ____________________________________________  EKG   EKG Interpretation  Date/Time:  Friday February 13 2018 01:43:01 EST Ventricular Rate:  117 PR Interval:    QRS Duration: 124 QT Interval:  356 QTC Calculation: 497 R Axis:   79 Text Interpretation:  Sinus tachycardia Nonspecific intraventricular conduction delay Nonspecific repol abnormality, diffuse leads new IVCD. minor ST changes related to same? or tachycardia? no obvious stemi Confirmed by Jayesh Marbach, Barbara Cower [phone]) on 02/13/2018 1:59:04 AM       ____________________________________________  RADIOLOGY  No results found.  ____________________________________________   PROCEDURES  Procedure(s) performed:   Procedures  CRITICAL CARE Performed by: Marily Memos Total critical care time: 35 minutes Critical care time was exclusive of separately billable procedures and treating other patients. Critical care was necessary to treat or prevent imminent or life-threatening  deterioration. Critical care was time spent personally by me on the following activities: development of treatment plan with patient and/or surrogate as well as nursing, discussions with consultants, evaluation of patient's response to treatment, examination of patient, obtaining history from patient or surrogate, ordering and performing treatments and interventions, ordering and review of laboratory studies, ordering and review of radiographic studies, pulse oximetry and re-evaluation of patient's condition.  ____________________________________________   INITIAL IMPRESSION / ASSESSMENT AND PLAN / ED COURSE  Acute hypoxic respiratory failure likely secondary to acute on chronic CHF exacerbation.  Hypoxic, tachypneic with EMS but improved here.  Blood pressure is a bit soft so we will go slow on the nitro pending x-ray before Lasix.  Patient is full code but prefers to avoid intubation if possible. Critically ill and will require multiple reevaluations for  appropriate level of care in hospital.   Reevaluation with improving symptoms but still tachypneic. Labs c/w likely CHF. HR/BP improving on ntg drip.   Reevaluated and improving lung sounds but not ready to come off of bipap. Will d/w medicine regarding admission to SDU>     Pertinent labs & imaging results that were available during my care of the patient were reviewed by me and considered in my medical decision making (see chart for details).  ____________________________________________  FINAL CLINICAL IMPRESSION(S) / ED DIAGNOSES  Final diagnoses:  Sepsis (HCC)     MEDICATIONS GIVEN DURING THIS VISIT:  Medications  nitroGLYCERIN 50 mg in dextrose 5 % 250 mL (0.2 mg/mL) infusion (has no administration in time range)     NEW OUTPATIENT MEDICATIONS STARTED DURING THIS VISIT:  New Prescriptions   No medications on file    Note:  This note was prepared with assistance of Dragon voice recognition software. Occasional wrong-word or sound-a-like substitutions may have occurred due to the inherent limitations of voice recognition software.   Dimonique Bourdeau, Barbara Cower, MD 02/15/18 [phone]

## 2018-02-13 NOTE — Progress Notes (Signed)
[facility] PROGRESS NOTE    Progress Note  [name] [name]  KMM:[id number] DOB: 22-Apr-1950 DOA: 02/13/2018 PCP: Georgann Housekeeper, MD     Brief Narrative:   [name] [name] is an 68 y.o. female past medical history significant for CAD status post stent, PAD with bilateral aorto femoral bypass, essential hypertension, chronic systolic heart failure, insulin-dependent diabetes mellitus uncontrolled, also with a history of noncompliance presents to the emergency room in acute respiratory distress.  Assessment/Plan:   Acute respiratory failure with hypoxia due to Acute on chronic systolic CHF (congestive heart failure)/possible acute cardiorenal syndrome: - Chest x-ray shows cardiomegaly with mild vascular congestion, with an elevated BNP, in a patient with a history of noncompliance, she has JVD and lower extremity edema on physical exam.  Better on BiPAP and nitroglycerin on admission now off nitro, as we continue to diurese we will try to wean her off the BiPAP.- Estimated dry weight is around 75 kg on admission she was 84 kg. - Monitor strict I's and O's Daily weights continue IV Lasix monitor electrolytes and replete as needed. -Baseline creatinine less than 1 see below for further details. -Continue her Imdur home dose we will start her on hydralazine IV scheduled.  Once her creatinine returns to baseline can resume her ARB. -Wean her off BiPAP, to nasal cannula. -Echocardiogram and it pending.  Acute kidney injury: Possibly with cardiorenal syndrome, her weight is significantly up. Continue IV Lasix strict I's and O's Daily weight and recheck a basic metabolic panel in the morning. Hold ARB.  CAD/ PAD (peripheral artery disease) (HCC): No anginal complaints, EKG shows sinus tachycardia, cardiac biomarkers are negative. Continue aspirin Plavix beta blockers and nitrates.  Insulin-dependent diabetes mellitus: With a last A1c of 7.6. Discontinue metformin, start her on long-acting  insulin plus sliding scale.  Essential hypertension: Was severely elevated on admission. She was placed on a nitroglycerin drip her losartan was held due to her rise in her creatinine. Improved resume her metoprolol, Imdur. Schedule on IV hydralazine. Continue losartan hold losartan due to acute renal failure.   Hyperkalemia: In the setting of ARB and new acute kidney injury. We will give oral Kayexalate.  Leukocytosis: She denies any cough, burning when she urinates, or fevers. She has remained afebrile here in the hospital. Likely due to stress demargination.  We will continue to follow fever curve.   DVT prophylaxis: Lovenox Family Communication:none Disposition Plan/Barrier to D/C: keep in [facility] Code Status:     Code Status Orders  (From admission, onward)         Start     Ordered   02/13/18 0439  Full code  Continuous     02/13/18 0440        Code Status History    Date Active Date Inactive Code Status Order ID Comments User Context   10/17/2016 1327 10/28/2016 1949 Full Code [id number]  Runell Gess, MD Inpatient   10/31/2015 0151 11/14/2015 2143 Full Code [id number]  Jonah Blue, MD Inpatient   09/28/2015 1430 09/29/2015 1904 Full Code [id number]  Jolyn Nap, RN Inpatient   09/05/2015 1412 09/07/2015 1626 Full Code [id number]  Raymond Gurney, PA-C Inpatient   08/28/2015 1013 08/29/2015 2045 Full Code [id number]  Runell Gess, MD Inpatient   03/02/2013 2153 03/04/2013 1702 Full Code [id number]  Penny Pia, MD ED        IV Access:    Peripheral IV   Procedures and diagnostic studies:   Dg Chest Portable 1  View  Result Date: 02/13/2018 CLINICAL DATA:  68 year old female with increased shortness of breath. EXAM: PORTABLE CHEST 1 VIEW COMPARISON:  Chest radiograph dated 10/21/2016 FINDINGS: There is mild cardiomegaly and mild vascular congestion. No focal consolidation, pleural effusion, or pneumothorax. Atherosclerotic calcification of the aortic  arch. No acute osseous pathology. IMPRESSION: Cardiomegaly with mild vascular congestion. No focal consolidation. Electronically Signed   By: Elgie Collard M.D.   On: 02/13/2018 02:16     Medical Consultants:    None.  Anti-Infectives:   None  Subjective:    [name] [name] she relates her shortness of breath is improved compared to when she came in. She denies any chest pain.  Objective:    Vitals:   02/13/18 0600 02/13/18 0615 02/13/18 0700 02/13/18 0713  BP: 127/69 (!) 130/55 110/78   Pulse: 90 88 87 86  Resp: (!) 21 (!) 24 (!) 23 20  Temp:      TempSrc:      SpO2: 98% 97% 98% 99%    Intake/Output Summary (Last 24 hours) at 02/13/2018 0759 Last data filed at 02/13/2018 0758 Gross per 24 hour  Intake 0.33 ml  Output 550 ml  Net -549.67 ml   There were no vitals filed for this visit.  Exam: General exam: In no acute distress. Respiratory system: Good air movement with crackles at bases. Cardiovascular system: S1 & S2 heard, RRR.  Positive JVD Gastrointestinal system: Abdomen is nondistended, soft and nontender.  Central nervous system: Alert and oriented. No focal neurological deficits. Extremities: Hypertrophic skin, 3+ edema Skin: Thick hypertrophic skin Psychiatry: Judgement and insight appear normal. Mood & affect appropriate.    Data Reviewed:    Labs: Basic Metabolic Panel: Recent Labs  Lab 02/13/18 0152 02/13/18 0407  NA 136  --   K 5.6* 5.8*  CL 105  --   CO2 19*  --   GLUCOSE 203*  --   BUN 33*  --   CREATININE 1.54*  --   CALCIUM 8.8*  --    GFR CrCl cannot be calculated (Unknown ideal weight.). Liver Function Tests: Recent Labs  Lab 02/13/18 0152  AST 39  ALT 15  ALKPHOS 93  BILITOT 1.2  PROT 7.0  ALBUMIN 3.1*   No results for input(s): LIPASE, AMYLASE in the last 168 hours. No results for input(s): AMMONIA in the last 168 hours. Coagulation profile No results for input(s): INR, PROTIME in the last 168  hours.  CBC: Recent Labs  Lab 02/13/18 0152  WBC 13.3*  NEUTROABS 10.5*  HGB 11.1*  HCT 33.2*  MCV 86.9  PLT 288   Cardiac Enzymes: Recent Labs  Lab 02/13/18 0152  TROPONINI <0.03   BNP (last 3 results) No results for input(s): PROBNP in the last 8760 hours. CBG: Recent Labs  Lab 02/13/18 0524  GLUCAP 207*   D-Dimer: No results for input(s): DDIMER in the last 72 hours. Hgb A1c: No results for input(s): HGBA1C in the last 72 hours. Lipid Profile: No results for input(s): CHOL, HDL, LDLCALC, TRIG, CHOLHDL, LDLDIRECT in the last 72 hours. Thyroid function studies: No results for input(s): TSH, T4TOTAL, T3FREE, THYROIDAB in the last 72 hours.  Invalid input(s): FREET3 Anemia work up: No results for input(s): VITAMINB12, FOLATE, FERRITIN, TIBC, IRON, RETICCTPCT in the last 72 hours. Sepsis Labs: Recent Labs  Lab 02/13/18 0152  WBC 13.3*   Microbiology No results found for this or any previous visit (from the past 240 hour(s)).   Medications:   .  amLODipine  5 mg Oral Daily  . aspirin EC  81 mg Oral Daily  . atorvastatin  80 mg Oral q1800  . clopidogrel  75 mg Oral Daily  . furosemide  40 mg Intravenous BID  . gabapentin  300 mg Oral TID  . heparin  5,000 Units Subcutaneous Q8H  . insulin aspart  0-9 Units Subcutaneous Q4H  . insulin glargine  8 Units Subcutaneous QHS  . isosorbide mononitrate  90 mg Oral Daily  . metoprolol succinate  75 mg Oral Daily  . pantoprazole  40 mg Oral Daily  . sodium chloride flush  3 mL Intravenous Q12H   Continuous Infusions: . sodium chloride        LOS: 0 days   Marinda Elk  [facility]   *Please refer to [email or web address], password [email or web address] to get updated schedule on who will round on this patient, as hospitalists switch teams weekly. If 7PM-7AM, please contact night-coverage at [email or web address], password [email or web address] for any overnight needs.  02/13/2018, 7:59 AM

## 2018-02-13 NOTE — H&P (Addendum)
History and Physical    [name] [name] WPY:[id number] DOB: 03-25-1950 DOA: 02/13/2018  PCP: Georgann Housekeeper, MD   Patient coming from: Home   Chief Complaint: Respiratory distress   HPI: [name] [name] is a 68 y.o. female with medical history significant for CAD with stent, PAD status post bilateral lower extremity bypass, hypertension, chronic systolic CHF, insulin-dependent diabetes mellitus, and noncompliance, now presenting to the emergency department with acute respiratory distress.  Patient is accompanied by her daughter who assists with the history.  Patient had reportedly been complaining of worsening bilateral leg swelling and shortness of breath with cough for several days, was noted to be short of breath at rest beginning the night of 02/11/2018, has had orthopnea, continued to worsen, was in acute distress last night, and EMS was called out.  Patient was reported to be severely hypertensive on EMS arrival, using accessory muscles to breathe, was given 2 sublingual nitroglycerin and placed on CPAP, and brought into the ED.  Patient denies any fevers, chills, or chest pain.  Denies calf tenderness or hemoptysis.  ED Course: Upon arrival to the ED, patient is found to be afebrile, in respiratory distress, saturating low 90s on BiPAP, tachypneic, mildly tachycardic, and with stable blood pressure.  EKG features sinus tachycardia with rate 117, nonspecific IVCD, and repolarization abnormality.  Chest x-ray is notable for cardiomegaly, vascular congestion, and no focal consolidation.  Chemistry panel features a potassium of 5.6, glucose 203, and creatinine of 1.54, up from 1.1 more than a year ago.  CBC is notable for leukocytosis to 13,300 and a mild normocytic anemia.  Troponin is undetectable and BNP elevated 412.  Patient was started on BiPAP immediately in the ED, started on nitroglycerin infusion, and Lasix.  Respiratory status has improved significantly, blood pressure has normalized, patient  denies chest pain, she reports subjective improvement but remains dyspneic with speech and will be observed for further evaluation and management of acute on chronic systolic CHF with acute respiratory failure.  Review of Systems:  All other systems reviewed and apart from HPI, are negative.  Past Medical History:  Diagnosis Date  . Anemia 10/2015   Acute Blood Loss  . Arthritis    "feel like I have it all over" (08/28/2015)  . CHF (congestive heart failure) (HCC)   . Complication of anesthesia    DIFFICULT WAKING "only when I was smoking; no problems since I quit"  . Coronary artery disease   . Family history of adverse reaction to anesthesia    sister slow to wake up  . GERD (gastroesophageal reflux disease)    takes Protonix daily   . Hip bursitis   . History of blood transfusion    10/2015  . Hyperlipidemia LDL goal < 70 06/28/2013   takes Atorvastatin daily  . Hypertension    takes Metoprolol and Imdur daily  . Malnutrition (HCC)   . Migraine    "none in a long time" (08/28/2015)  . Myocardial infarction (HCC) 2011  . Neuromuscular disorder (HCC)    DIABETIC NEUROPATHY  . Osteomyelitis (HCC) 2017   Left foot  . PAD (peripheral artery disease) (HCC)   . Peripheral vascular disease (HCC)   . Respiratory failure (HCC) 10/2015   Acute Hypoxia- acute pulmonary edema 11/13/2015  . Septic shock (HCC) 10/2015  . Stroke (HCC)   . Type II diabetes mellitus (HCC)    takes Lantus nightly.Average fasting blood sugar runs 80-90  Type II    Past Surgical History:  Procedure Laterality Date  . ABDOMINAL HYSTERECTOMY    . AMPUTATION Right 10/22/2016   Procedure: AMPUTATION DIGIT RIGHT TOES 1-3 POSSIBLE TRANSMETATARSAL;  Surgeon: Chuck Hintickson, Christopher S, MD;  [facility];  Service: Vascular;  Laterality: Right;  . APPENDECTOMY    . ATHERECTOMY N/A 06/04/2011   Procedure: ATHERECTOMY;  Surgeon: Runell GessJonathan J Berry, MD;  [facility];  Service: Cardiovascular;  Laterality:  N/A;  . CARDIAC CATHETERIZATION  10/13/2009   95% stenosis in the AV groove circumflex and 95% ostial stenosis in small OM3. A 3x2528mm drug-eluting Promus stent inserted ito the circumflex. Dilatated with a 3.25x5312mm noncompliant Quantum balloon within entire segment. The entire region was reduced to 0% and brisk TIMI3 flow.  . CAROTID DUPLEX  03/19/2011   Right ICA-demonstrates complete occlusion. Left ICA-demonstrates a small amount of fibrous plaque.  Marland Kitchen. CATARACT EXTRACTION W/ INTRAOCULAR LENS IMPLANT Right   . CESAREAN SECTION  1990  . CORONARY ANGIOPLASTY    . ENDARTERECTOMY FEMORAL Left 09/05/2015   Procedure: ENDARTERECTOMY FEMORAL WITH PROFUNDOPLASTY;  Surgeon: Chuck Hinthristopher S Dickson, MD;  [facility];  Service: Vascular;  Laterality: Left;  Left common femoral artery vein patch using left saphenous vien  . ENDARTERECTOMY FEMORAL Right 10/22/2016   Procedure: ENDARTERECTOMY RIGHT COMMON FEMORAL;  Surgeon: Chuck Hintickson, Christopher S, MD;  [facility];  Service: Vascular;  Laterality: Right;  . FEMORAL-POPLITEAL BYPASS GRAFT Left 11/02/2015   Procedure: BYPASS GRAFT FEMORAL-POPLITEAL ARTERY VS FEMORAL-TIBIAL ARTERY BYPASS;  Surgeon: Chuck Hinthristopher S Dickson, MD;  [facility];  Service: Vascular;  Laterality: Left;  . FEMORAL-POPLITEAL BYPASS GRAFT Right 10/22/2016   Procedure: BYPASS GRAFT RIGHT FEMORAL- BELOW KNEE POPLITEAL ARTERY WITH VEIN;  Surgeon: Chuck Hintickson, Christopher S, MD;  [facility];  Service: Vascular;  Laterality: Right;  . I&D EXTREMITY Left 11/10/2015   Procedure: Debridement Left Foot Ulcer, Application  Wound VAC;  Surgeon: Nadara MustardMarcus V Duda, MD;  Location: [facility] OR;  Service: Orthopedics;  Laterality: Left;  . ILIAC ARTERY STENT Left 08/28/2015   common  . INTRAOPERATIVE ARTERIOGRAM Left 09/05/2015   Procedure: INTRA OPERATIVE ARTERIOGRAM;  Surgeon: Chuck Hinthristopher S Dickson, MD;  [facility];  Service: Vascular;  Laterality: Left;  . INTRAOPERATIVE ARTERIOGRAM Left 11/02/2015    Procedure: INTRA OPERATIVE ARTERIOGRAM;  Surgeon: Chuck Hinthristopher S Dickson, MD;  [facility];  Service: Vascular;  Laterality: Left;  . INTRAOPERATIVE ARTERIOGRAM Right 10/22/2016   Procedure: INTRA OPERATIVE ARTERIOGRAM;  Surgeon: Chuck Hintickson, Christopher S, MD;  [facility];  Service: Vascular;  Laterality: Right;  . LEXISCAN MYOVIEW  10/25/2010   Moderate perfusion defect due to infarct/scar with mild perinfarct ischemia seen in the Basal Inferolateral, Basal Anterolateral, Mid Inferolateral, and Mid Anterolateral regions. Post-stress EF is 50%.  . LOWER EXTREMITY ANGIOGRAPHY N/A 10/17/2016   Procedure: Lower Extremity Angiography;  Surgeon: Runell GessBerry, Jonathan J, MD;  [facility];  Service: Cardiovascular;  Laterality: N/A;  . OVARY SURGERY  1983?   "ruptured"  . PERIPHERAL VASCULAR ANGIOGRAM  01/26/2010   High-grade SFA disease: left greater than right. Left SFA would require fem-pop bypass grafting. Right SFA could be stented but might require Diamondback Orbital atherectomy.  Marland Kitchen. PERIPHERAL VASCULAR ANGIOGRAM  02/23/2010   Stealth Predator orbital rotational atherectomy performed on SFA & Popliteal up to 90,000 RPM. Stenting using overlapping 5x12300mm and 5x7360mm Absolute Pro Nitinol self-expanding stents beginning just at the knee up to the mid SFA resulting in reduction of 90-95% calcified SFA & Popliteal stenosis to 0. Stenting performed  on the distal common & proximal iliac artery with a 10x4 Absolute Pro- 70-0%.  Marland Kitchen PERIPHERAL VASCULAR ANGIOGRAM  06/17/2010   PTA performed to the right external iliac artery stent using a 5x100 balloon at 10 atmospheres. Stenting performed using a 6x18 Genesis on Opta balloon. Postdilatation with a 7x2 balloon resulting in a 95% "in-stent" stenosis to 0% residual.  . PERIPHERAL VASCULAR ANGIOGRAM  06/04/2011   Bilateral total SFAs not percutaneously addressable. Good canidate for femoropopliteal bypass grafting  . PERIPHERAL VASCULAR ANGIOGRAM   08/28/2015  . PERIPHERAL VASCULAR CATHETERIZATION N/A 08/28/2015   Procedure: Lower Extremity Angiography;  Surgeon: Runell Gess, MD;  [facility] CV LAB;  Service: Cardiovascular;  Laterality: N/A;  . PERIPHERAL VASCULAR CATHETERIZATION N/A 08/28/2015   Procedure: Abdominal Aortogram;  Surgeon: Runell Gess, MD;  Location: [facility] INV[facility] CV LAB;  Service: Cardiovascular;  Laterality: N/A;  . PERIPHERAL VASCULAR CATHETERIZATION Left 08/28/2015   Procedure: Peripheral Vascular Intervention;  Surgeon: Runell Gess, MD;  Location: [facility] INVASIVE CV LAB;  Service: Cardiovascular;  Laterality: Left;  common iliac  . PERIPHERAL VASCULAR CATHETERIZATION Left 08/28/2015   Procedure: Peripheral Vascular Atherectomy;  Surgeon: Runell Gess, MD;  Location: [facility] INVASIVE CV LAB;  Service: Cardiovascular;  Laterality: Left;  common iliac  . PERIPHERAL VASCULAR CATHETERIZATION N/A 09/28/2015   Procedure: Lower Extremity Angiography;  Surgeon: Runell Gess, MD;  Location: [facility] INVASIVE CV LAB;  Service: Cardiovascular;  Laterality: N/A;  . PERIPHERAL VASCULAR CATHETERIZATION Left 09/28/2015   Procedure: Peripheral Vascular Intervention;  Surgeon: Runell Gess, MD;  Location: [facility] INVASIVE CV LAB;  Service: Cardiovascular;  Laterality: Left CFA  PCI with 9 mm x 4 cm Abbott nitinol absolute Pro self-expanding stent     . SKIN SPLIT GRAFT Left 12/01/2015   Procedure: LEFT FOOT SKIN GRAFT AND VAC;  Surgeon: [name], MD;  Location: [facility] OR;  Service: Orthopedics;  Laterality: Left;  . THROMBECTOMY FEMORAL ARTERY Right 10/22/2016   Procedure: THROMBECTOMY FEMORAL ARTERY;  Surgeon: Chuck Hint, MD;  Location[facility] OR;  Service: Vascular;  Laterality: Right;  . TRANSTHORACIC ECHOCARDIOGRAM  10/17/2009   EF 45-50%, moderate hypokinesis of the entire inferolateral myocardium, mild concentric hypertrophy and mild regurg of the mitral valva.  Marland Kitchen VEIN HARVEST Left 11/02/2015   Procedure: LEFT  GREATER SAPHENOUS VEIN HARVEST;  Surgeon: Chuck Hint, MD;  Location[facility] OR;  Service: Vascular;  Laterality: Left;     reports that she quit smoking about 8 years ago. Her smoking use included cigarettes. She has a 61.50 pack-year smoking history. She has never used smokeless tobacco. She reports that she does not drink alcohol or use drugs.  Allergies  Allergen Reactions  . Doxycycline Other (See Comments)    lethargy  . Hydrochlorothiazide Other (See Comments)    lethargic   . Latex Rash  . Penicillins Swelling and Rash    Pt states she has tolerated Keflex in the past without problems. States she may have tolerated Augmentin in the past but it caused GI upset. Has patient had a PCN reaction causing immediate rash, facial/tongue/throat swelling, SOB or lightheadedness with hypotension: Yes Has patient had a PCN reaction causing severe rash involving mucus membranes or skin necrosis: No Has patient had a PCN reaction that required hospitalization No Has patient had a PCN reaction occurring within the last 10 years: No    Family History  Problem Relation Age of Onset  . Hypertension Mother   . Heart  failure Mother   . Heart failure Father   . Stroke Father   . Diabetes Father      Prior to Admission medications   Medication Sig Start Date End Date Taking? Authorizing Provider  amLODipine (NORVASC) 5 MG tablet TAKE 1 TABLET(5 MG) BY MOUTH DAILY 11/29/16   Runell Gess, MD  aspirin EC 81 MG tablet Take 81 mg by mouth daily.    [provider]  atorvastatin (LIPITOR) 80 MG tablet Take 80 mg by mouth. Take one tablet daily for hyperlipidemia    [provider]  clopidogrel (PLAVIX) 75 MG tablet Take 75 mg by mouth. Take one tablet daily for PVD    [provider]  furosemide (LASIX) 40 MG tablet Take 40 mg by mouth daily.  05/31/16   [provider]  gabapentin (NEURONTIN) 300 MG capsule Take 300 mg by mouth 3 (three) times daily.   10/10/15   [provider]  isosorbide mononitrate (IMDUR) 30 MG 24 hr tablet 30 mg. Take 1 tablet along by mouth with 60 mg to equal 90 mg daily.    [provider]  isosorbide mononitrate (IMDUR) 60 MG 24 hr tablet 60 mg. Take 1 tablet by mouth along with 30 mg to equal 90 mg daily    [provider]  LEVEMIR FLEXTOUCH 100 UNIT/ML Pen INJECT 30 UNITS INTO THE SKIN HS UTD. DISCONTINUE LANTUS 03/15/16   [provider]  losartan (COZAAR) 100 MG tablet TAKE 1 TABLET BY MOUTH EVERY DAY 06/17/16   Lennette Bihari, MD  metFORMIN (GLUCOPHAGE) 1000 MG tablet Take 1,000 mg by mouth 2 (two) times daily with a meal.    [provider]  metoprolol succinate (TOPROL-XL) 50 MG 24 hr tablet TAKE 1 AND 1/2 TABLETS BY MOUTH DAILY( NEW DOSE) 03/24/17   Lennette Bihari, MD  pantoprazole (PROTONIX) 40 MG tablet TAKE 1 TABLET BY MOUTH DAILY 11/19/17   Lennette Bihari, MD  potassium chloride (K-DUR,KLOR-CON) 10 MEQ tablet Take 10 mEq by mouth daily.  05/31/16   [provider]    Physical Exam: Vitals:   02/13/18 0330 02/13/18 0345 02/13/18 0400 02/13/18 0415  BP: (!) 146/55 126/75 114/60 (!) 118/59  Pulse: 96 94 87 91  Resp: (!) 27 (!) 27 (!) 24 (!) 26  Temp:      TempSrc:      SpO2: 98% 98% 97% 98%    Constitutional: Tachypneic, labored respirations, no pallor, no diaphoresis  Eyes: PERTLA, lids and conjunctivae normal ENMT: Mucous membranes are moist. Posterior pharynx clear of any exudate or lesions.   Neck: normal, supple, no masses, no thyromegaly Respiratory: tachypnea, dyspnea with speech. Rales bilaterally. Increased WOB. No pallor or cyanosis.  Cardiovascular: S1 & S2 heard, regular rate and rhythm. 2+ pitting edema to b/l LE's. Abdomen: No distension, no tenderness, soft. Bowel sounds active.  Musculoskeletal: no clubbing / cyanosis. 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  Skin: no significant rashes, lesions, ulcers. Warm, dry,  well-perfused. Neurologic: No facial asymmetry. Sensation intact. Moving all extremities.  Psychiatric: Alert and oriented x 3. Calm, cooperative.    Labs on Admission: I have personally reviewed following labs and imaging studies  CBC: Recent Labs  Lab 02/13/18 0152  WBC 13.3*  NEUTROABS 10.5*  HGB 11.1*  HCT 33.2*  MCV 86.9  PLT 288   Basic Metabolic Panel: Recent Labs  Lab 02/13/18 0152 02/13/18 0407  NA 136  --   K 5.6* 5.8*  CL 105  --   CO2 19*  --   GLUCOSE 203*  --   BUN 33*  --   CREATININE 1.54*  --   CALCIUM 8.8*  --    GFR: CrCl cannot be calculated (Unknown ideal weight.). Liver Function Tests: Recent Labs  Lab 02/13/18 0152  AST 39  ALT 15  ALKPHOS 93  BILITOT 1.2  PROT 7.0  ALBUMIN 3.1*   No results for input(s): LIPASE, AMYLASE in the last 168 hours. No results for input(s): AMMONIA in the last 168 hours. Coagulation Profile: No results for input(s): INR, PROTIME in the last 168 hours. Cardiac Enzymes: Recent Labs  Lab 02/13/18 0152  TROPONINI <0.03   BNP (last 3 results) No results for input(s): PROBNP in the last 8760 hours. HbA1C: No results for input(s): HGBA1C in the last 72 hours. CBG: No results for input(s): GLUCAP in the last 168 hours. Lipid Profile: No results for input(s): CHOL, HDL, LDLCALC, TRIG, CHOLHDL, LDLDIRECT in the last 72 hours. Thyroid Function Tests: No results for input(s): TSH, T4TOTAL, FREET4, T3FREE, THYROIDAB in the last 72 hours. Anemia Panel: No results for input(s): VITAMINB12, FOLATE, FERRITIN, TIBC, IRON, RETICCTPCT in the last 72 hours. Urine analysis:    Component Value Date/Time   COLORURINE YELLOW 09/05/2015 0641   APPEARANCEUR CLEAR 09/05/2015 0641   LABSPEC 1.013 09/05/2015 0641   PHURINE 6.0 09/05/2015 0641   GLUCOSEU 100 (A) 09/05/2015 0641   HGBUR TRACE (A) 09/05/2015 0641   BILIRUBINUR NEGATIVE 09/05/2015 0641   KETONESUR NEGATIVE 09/05/2015 0641   PROTEINUR 100 (A) 09/05/2015  0641   UROBILINOGEN 1.0 10/16/2009 0243   NITRITE NEGATIVE 09/05/2015 0641   LEUKOCYTESUR NEGATIVE 09/05/2015 0641   Sepsis Labs: @LABRCNTIP (procalcitonin:4,lacticidven:4) )No results found for this or any previous visit (from the past 240 hour(s)).   Radiological Exams on Admission: Dg Chest Portable 1 View  Result Date: 02/13/2018 CLINICAL DATA:  68 year old female with increased shortness of breath. EXAM: PORTABLE CHEST 1 VIEW COMPARISON:  Chest radiograph dated 10/21/2016 FINDINGS: There is mild cardiomegaly and mild vascular congestion. No focal consolidation, pleural effusion, or pneumothorax. Atherosclerotic calcification of the aortic arch. No acute osseous pathology. IMPRESSION: Cardiomegaly with mild vascular congestion. No focal consolidation. Electronically Signed   By: Elgie CollardArash  Radparvar M.D.   On: 02/13/2018 02:16    EKG: Independently reviewed. Sinus tachycardia (rate 117), non-specific IVCD with repolarization abnormality, QRS slightly increased from 10/17/16 but o/w similar.   Assessment/Plan   1. Acute on chronic systolic CHF; acute hypoxic respiratory failure  - Presents with worsening leg swelling and SOB over a few days, becoming severe over 24 hrs leading up to presentation  - Severely hypertensive and in acute resp distress with EMS, treated with SL NTG x2 and CPAP prior to arrival  - Found to have elevated BNP, peripheral edema, rales, no fever and focal opacity on CXR  - Started on BiPAP and NTG infusion in ED and given Lasix 60 mg IV  - Acute on chronic systolic CHF suspected, likely secondary to non adherence with medications and dietary recommendations  - Continue diuresis with Lasix 40 mg IV q12h, continue beta-blocker, hold losartan until renal function stabilizes, update echocardiogram    2. CAD  - No anginal complaints  - EKG with sinus tachy, diffuse repolarization abnormality, and increased QRS - Troponin <0.03  - Continue ASA, Plavix, statin,  beta-blocker, and nitrates; holding ARB in light of increased creatinine; echo as above    3. Insulin-dependent DM  -  A1c was 7.6% remotely  - Managed at home with Lantus and metformin  - Check CBG's and use Lantus with correctional Novolog for now    4. Hypertension  - BP severely elevated with EMS per report and improved with NTG  - BP at goal by time of admission and NTG gtt stopped  - Hold losartan in light of increased creatinine, continue beta-blocker and diuresis, use hydralazine IVP's as needed    5. Renal insufficiency  - SCr is 1.54 in ED, up from 1-range on most recent labs from over a year ago  - She is hypervolemic and being diuresed  - Renally-dose medications, follow daily chem panel during IV diuresis   6. PAD  - Status-post b/l LE bypass  - Feet warm and well-perfused  - Continue statin and antiplatelets    DVT prophylaxis: sq heparin  Code Status: Full  Family Communication: Daughter updated at bedside  Consults called: none Admission status: Observation     Briscoe Deutscher, MD [facility] Pager [phone]  If 7PM-7AM, please contact night-coverage [email or web address] Password [email or web address]  02/13/2018, 4:40 AM

## 2018-02-13 NOTE — Progress Notes (Signed)
RT transported pt with RN from ED to 2H16.  No resp distress of complications noted.

## 2018-02-14 ENCOUNTER — Inpatient Hospital Stay (HOSPITAL_COMMUNITY): Payer: Medicare Other

## 2018-02-14 LAB — CBC WITH DIFFERENTIAL/PLATELET
Abs Immature Granulocytes: 0.04 10*3/uL (ref 0.00–0.07)
Basophils Absolute: 0 10*3/uL (ref 0.0–0.1)
Basophils Relative: 0 %
Eosinophils Absolute: 0.2 10*3/uL (ref 0.0–0.5)
Eosinophils Relative: 2 %
HCT: 28 % — ABNORMAL LOW (ref 36.0–46.0)
Hemoglobin: 9.3 g/dL — ABNORMAL LOW (ref 12.0–15.0)
IMMATURE GRANULOCYTES: 1 %
Lymphocytes Relative: 9 %
Lymphs Abs: 0.8 10*3/uL (ref 0.7–4.0)
MCH: 29.2 pg (ref 26.0–34.0)
MCHC: 33.2 g/dL (ref 30.0–36.0)
MCV: 88.1 fL (ref 80.0–100.0)
Monocytes Absolute: 0.6 10*3/uL (ref 0.1–1.0)
Monocytes Relative: 8 %
Neutro Abs: 6.6 10*3/uL (ref 1.7–7.7)
Neutrophils Relative %: 80 %
Platelets: 187 10*3/uL (ref 150–400)
RBC: 3.18 MIL/uL — ABNORMAL LOW (ref 3.87–5.11)
RDW: 14.2 % (ref 11.5–15.5)
WBC: 8.2 10*3/uL (ref 4.0–10.5)
nRBC: 0 % (ref 0.0–0.2)

## 2018-02-14 LAB — RESPIRATORY PANEL BY PCR
Adenovirus: NOT DETECTED
Bordetella pertussis: NOT DETECTED
Chlamydophila pneumoniae: NOT DETECTED
Coronavirus 229E: NOT DETECTED
Coronavirus HKU1: NOT DETECTED
Coronavirus NL63: NOT DETECTED
Coronavirus OC43: NOT DETECTED
Influenza A: NOT DETECTED
Influenza B: NOT DETECTED
METAPNEUMOVIRUS-RVPPCR: NOT DETECTED
Mycoplasma pneumoniae: NOT DETECTED
PARAINFLUENZA VIRUS 4-RVPPCR: NOT DETECTED
Parainfluenza Virus 1: NOT DETECTED
Parainfluenza Virus 2: NOT DETECTED
Parainfluenza Virus 3: NOT DETECTED
Respiratory Syncytial Virus: DETECTED — AB
Rhinovirus / Enterovirus: NOT DETECTED

## 2018-02-14 LAB — COMPREHENSIVE METABOLIC PANEL
ALBUMIN: 2.6 g/dL — AB (ref 3.5–5.0)
ALT: 14 U/L (ref 0–44)
AST: 34 U/L (ref 15–41)
Alkaline Phosphatase: 76 U/L (ref 38–126)
Anion gap: 11 (ref 5–15)
BUN: 29 mg/dL — ABNORMAL HIGH (ref 8–23)
CHLORIDE: 102 mmol/L (ref 98–111)
CO2: 23 mmol/L (ref 22–32)
Calcium: 8.4 mg/dL — ABNORMAL LOW (ref 8.9–10.3)
Creatinine, Ser: 1.44 mg/dL — ABNORMAL HIGH (ref 0.44–1.00)
GFR calc Af Amer: 43 mL/min — ABNORMAL LOW (ref >60)
GFR calc non Af Amer: 37 mL/min — ABNORMAL LOW (ref >60)
Glucose, Bld: 174 mg/dL — ABNORMAL HIGH (ref 70–99)
Potassium: 4.9 mmol/L (ref 3.5–5.1)
Sodium: 136 mmol/L (ref 135–145)
Total Bilirubin: 0.5 mg/dL (ref 0.3–1.2)
Total Protein: 6.2 g/dL — ABNORMAL LOW (ref 6.5–8.1)

## 2018-02-14 LAB — INFLUENZA PANEL BY PCR (TYPE A & B)
Influenza A By PCR: NEGATIVE
Influenza B By PCR: NEGATIVE

## 2018-02-14 LAB — BASIC METABOLIC PANEL
Anion gap: 12 (ref 5–15)
BUN: 30 mg/dL — ABNORMAL HIGH (ref 8–23)
CO2: 22 mmol/L (ref 22–32)
Calcium: 8.2 mg/dL — ABNORMAL LOW (ref 8.9–10.3)
Chloride: 101 mmol/L (ref 98–111)
Creatinine, Ser: 1.53 mg/dL — ABNORMAL HIGH (ref 0.44–1.00)
GFR calc Af Amer: 40 mL/min — ABNORMAL LOW (ref >60)
GFR calc non Af Amer: 35 mL/min — ABNORMAL LOW (ref >60)
Glucose, Bld: 186 mg/dL — ABNORMAL HIGH (ref 70–99)
Potassium: 4.8 mmol/L (ref 3.5–5.1)
SODIUM: 135 mmol/L (ref 135–145)

## 2018-02-14 LAB — PROCALCITONIN: PROCALCITONIN: 0.16 ng/mL

## 2018-02-14 LAB — LACTIC ACID, PLASMA
Lactic Acid, Venous: 1.4 mmol/L (ref 0.5–1.9)
Lactic Acid, Venous: 0.9 mmol/L (ref 0.5–1.9)

## 2018-02-14 LAB — GLUCOSE, CAPILLARY
Glucose-Capillary: 174 mg/dL — ABNORMAL HIGH (ref 70–99)
Glucose-Capillary: 172 mg/dL — ABNORMAL HIGH (ref 70–99)
Glucose-Capillary: 155 mg/dL — ABNORMAL HIGH (ref 70–99)
Glucose-Capillary: 228 mg/dL — ABNORMAL HIGH (ref 70–99)
Glucose-Capillary: 141 mg/dL — ABNORMAL HIGH (ref 70–99)

## 2018-02-14 LAB — HIV ANTIBODY (ROUTINE TESTING W REFLEX): HIV SCREEN 4TH GENERATION: NONREACTIVE

## 2018-02-14 LAB — MAGNESIUM: MAGNESIUM: 1.5 mg/dL — AB (ref 1.7–2.4)

## 2018-02-14 LAB — PROTIME-INR
INR: 1.25
Prothrombin Time: 15.5 seconds — ABNORMAL HIGH (ref 11.4–15.2)

## 2018-02-14 LAB — APTT: aPTT: 36 seconds (ref 24–36)

## 2018-02-14 MED ORDER — SODIUM CHLORIDE 0.9 % IV SOLN
2.0000 g | INTRAVENOUS | Status: DC
  Administered 2018-02-14 – 2018-02-21 (×8): 2 g via INTRAVENOUS
  Filled 2018-02-14 (×9): qty 2

## 2018-02-14 MED ORDER — FUROSEMIDE 10 MG/ML IJ SOLN
40.0000 mg | Freq: Every day | INTRAMUSCULAR | Status: DC
  Administered 2018-02-14: 40 mg via INTRAVENOUS
  Filled 2018-02-14: qty 4

## 2018-02-14 MED ORDER — VANCOMYCIN HCL 10 G IV SOLR
2000.0000 mg | Freq: Once | INTRAVENOUS | Status: AC
  Administered 2018-02-14: 2000 mg via INTRAVENOUS
  Filled 2018-02-14: qty 2000

## 2018-02-14 MED ORDER — HYDRALAZINE HCL 20 MG/ML IJ SOLN: 5.0000 mg | Freq: Four times a day (QID) | INTRAMUSCULAR | Status: DC | PRN

## 2018-02-14 MED ORDER — VANCOMYCIN HCL 10 G IV SOLR
1250.0000 mg | INTRAVENOUS | Status: DC
  Administered 2018-02-16: 1250 mg via INTRAVENOUS
  Filled 2018-02-14: qty 1250

## 2018-02-14 MED ORDER — FUROSEMIDE 10 MG/ML IJ SOLN: 40.0000 mg | Freq: Two times a day (BID) | INTRAMUSCULAR | Status: DC

## 2018-02-14 NOTE — Progress Notes (Signed)
Pharmacy Antibiotic Note  [name] [name] is a 68 y.o. female admitted on 02/13/2018 with respiratory distress.  Pharmacy has been consulted for vancomycin and cefepime dosing for possible sepsis.   Patient currently febrile to 101.2, wbc normal at 8. CEs negative, PCT 0.16, LA 1.4. Scr slightly elevated at 1.44 but improved on recheck.   Vancomycin 1250 mg IV Q 48 hrs. Goal AUC 400-550. Expected AUC: 465 SCr used: 1.4  Plan: Vancomycin 1250 IV every 48 hours.  Goal trough 15-20 mcg/mL.  Cefepime 2g q24 hours  Weight: 184 lb 1.4 oz (83.5 kg)  Temp (24hrs), Avg:99.7 F (37.6 C), Min:98.5 F (36.9 C), Max:101.2 F (38.4 C)  Recent Labs  Lab 02/13/18 0152 02/14/18 0436  WBC 13.3*  --   CREATININE 1.54* 1.53*    Estimated Creatinine Clearance: 37.3 mL/min (A) (by C-G formula based on SCr of 1.53 mg/dL (H)).    Allergies  Allergen Reactions  . Doxycycline Other (See Comments)    lethargy  . Hydrochlorothiazide Other (See Comments)    lethargic   . Latex Rash  . Penicillins Swelling and Rash    Pt states she has tolerated Keflex in the past without problems. States she may have tolerated Augmentin in the past but it caused GI upset. Has patient had a PCN reaction causing immediate rash, facial/tongue/throat swelling, SOB or lightheadedness with hypotension: Yes Has patient had a PCN reaction causing severe rash involving mucus membranes or skin necrosis: No Has patient had a PCN reaction that required hospitalization No Has patient had a PCN reaction occurring within the last 10 years: No    Thank you for allowing pharmacy to be a part of this patient's care.  Sheppard Coil PharmD., BCPS Clinical Pharmacist 02/14/2018 7:19 AM

## 2018-02-14 NOTE — Progress Notes (Addendum)
[facility] PROGRESS NOTE    Progress Note  [name] Ageonie N Buikema  ZOX:[id number] DOB: 1950-09-24 DOA: 02/13/2018 PCP: Georgann HousekeeperHusain, Karrar, MD     Brief Narrative:   [name] [name] is an 68 y.o. female past medical history significant for CAD status post stent, PAD with bilateral aorto femoral bypass, essential hypertension, chronic systolic heart failure, insulin-dependent diabetes mellitus uncontrolled, also with a history of noncompliance presents to the emergency room in acute respiratory distress.  Assessment/Plan:   Acute respiratory failure with hypoxia due to Acute on chronic systolic CHF (congestive heart failure): - Estimated dry weight is around 80 kg on admission she was 84 kg. - Monitor strict I's and O's Daily weights. -Baseline creatinine less than 1. -Hold her Lasix as she has developed a fever with mild leukocytosis, check a CBC. - I am concerned about a lower tract pulmonary infection.  SIRS: Get a CT of the chest. She is fluid overloaded blood pressure is holding, will start her empirically on IV vancomycin and cefepime. Has come down. We will back off on antihypertensive medication. Check an influenza PCR.   Transfer to stepdown.  Acute kidney injury: her weight is significantly up. Hold her ARB discontinue her Lasix, her creatinine has not improved question due to sepsis syndrome.  CAD/ PAD (peripheral artery disease) (HCC): No anginal complaints, EKG shows sinus tachycardia, cardiac biomarkers are negative. Continue aspirin Plavix beta blockers and nitrates.  Insulin-dependent diabetes mellitus: With a last A1c of 7.6. Discontinue metformin, start her on long-acting insulin plus sliding scale.  Essential hypertension: Sure significantly improved, she now has a sepsis syndrome. We will discontinue hydralazine and IV Lasix. Due to metoprolol. Continue losartan hold losartan due to acute renal failure.   Hyperkalemia: Solved with a single dose of  Kayexalate.  Leukocytosis: New fevers in the hospital, see sirs physiology for further details.   DVT prophylaxis: Lovenox Family Communication:none Disposition Plan/Barrier to D/C: Transfer to progressive Code Status:     Code Status Orders  (From admission, onward)         Start     Ordered   02/13/18 0439  Full code  Continuous     02/13/18 0440        Code Status History    Date Active Date Inactive Code Status Order ID Comments User Context   10/17/2016 1327 10/28/2016 1949 Full Code [id number]  Runell GessBerry, Jonathan J, MD Inpatient   10/31/2015 0151 11/14/2015 2143 Full Code [id number]  Jonah BlueYates, Jennifer, MD Inpatient   09/28/2015 1430 09/29/2015 1904 Full Code [id number]  Jolyn Nap'Neal, Jennifer N, RN Inpatient   09/05/2015 1412 09/07/2015 1626 Full Code [id number]  Raymond Gurneyrinh, Kimberly A, PA-C Inpatient   08/28/2015 1013 08/29/2015 2045 Full Code [id number]  Runell GessBerry, Jonathan J, MD Inpatient   03/02/2013 2153 03/04/2013 1702 Full Code [id number]  Penny PiaVega, [place], MD ED        IV Access:    Peripheral IV   Procedures and diagnostic studies:   Dg Chest Portable 1 View  Result Date: 02/13/2018 CLINICAL DATA:  68 year old female with increased shortness of breath. EXAM: PORTABLE CHEST 1 VIEW COMPARISON:  Chest radiograph dated 10/21/2016 FINDINGS: There is mild cardiomegaly and mild vascular congestion. No focal consolidation, pleural effusion, or pneumothorax. Atherosclerotic calcification of the aortic arch. No acute osseous pathology. IMPRESSION: Cardiomegaly with mild vascular congestion. No focal consolidation. Electronically Signed   By: Elgie CollardArash  Radparvar M.D.   On: 02/13/2018 02:16     Medical Consultants:    None.  Anti-Infectives:  None  Subjective:    [name] Age she relates she still short of breath, she is now having fevers.  She now admits to having a cough for 2 days prior to admission.  Objective:    Vitals:   02/14/18 0400 02/14/18 0500 02/14/18 0501 02/14/18 0600  BP: (!)  152/52 101/80 101/80 122/61  Pulse: 100 (!) 101  87  Resp:      Temp:      TempSrc:      SpO2: 93% 97%  96%  Weight:        Intake/Output Summary (Last 24 hours) at 02/14/2018 0705 Last data filed at 02/14/2018 0300 Gross per 24 hour  Intake 780 ml  Output 1300 ml  Net -520 ml   Filed Weights   02/14/18 0254  Weight: 83.5 kg    Exam: General exam: In no acute distress. Respiratory system: Good air movement with crackles at bases. Cardiovascular system: S1 & S2 heard, RRR.  Positive JVD Gastrointestinal system: Abdomen is nondistended, soft and nontender.  Central nervous system: Alert and oriented. No focal neurological deficits. Extremities: Hypertrophic skin, 3+ edema Skin: Thick hypertrophic skin Psychiatry: Judgement and insight appear normal. Mood & affect appropriate.    Data Reviewed:    Labs: Basic Metabolic Panel: Recent Labs  Lab 02/13/18 0152 02/13/18 0407 02/14/18 0436  NA 136  --  135  K 5.6* 5.8* 4.8  CL 105  --  101  CO2 19*  --  22  GLUCOSE 203*  --  186*  BUN 33*  --  30*  CREATININE 1.54*  --  1.53*  CALCIUM 8.8*  --  8.2*  MG  --   --  1.5*   GFR Estimated Creatinine Clearance: 37.3 mL/min (A) (by C-G formula based on SCr of 1.53 mg/dL (H)). Liver Function Tests: Recent Labs  Lab 02/13/18 0152  AST 39  ALT 15  ALKPHOS 93  BILITOT 1.2  PROT 7.0  ALBUMIN 3.1*   No results for input(s): LIPASE, AMYLASE in the last 168 hours. No results for input(s): AMMONIA in the last 168 hours. Coagulation profile No results for input(s): INR, PROTIME in the last 168 hours.  CBC: Recent Labs  Lab 02/13/18 0152  WBC 13.3*  NEUTROABS 10.5*  HGB 11.1*  HCT 33.2*  MCV 86.9  PLT 288   Cardiac Enzymes: Recent Labs  Lab 02/13/18 0152  TROPONINI <0.03   BNP (last 3 results) No results for input(s): PROBNP in the last 8760 hours. CBG: Recent Labs  Lab 02/13/18 0906 02/13/18 1229 02/13/18 1627 02/13/18 1929 02/13/18 2334  GLUCAP  171* 302* 140* 183* 133*   D-Dimer: No results for input(s): DDIMER in the last 72 hours. Hgb A1c: No results for input(s): HGBA1C in the last 72 hours. Lipid Profile: No results for input(s): CHOL, HDL, LDLCALC, TRIG, CHOLHDL, LDLDIRECT in the last 72 hours. Thyroid function studies: No results for input(s): TSH, T4TOTAL, T3FREE, THYROIDAB in the last 72 hours.  Invalid input(s): FREET3 Anemia work up: No results for input(s): VITAMINB12, FOLATE, FERRITIN, TIBC, IRON, RETICCTPCT in the last 72 hours. Sepsis Labs: Recent Labs  Lab 02/13/18 0152  WBC 13.3*   Microbiology No results found for this or any previous visit (from the past 240 hour(s)).   Medications:   . amLODipine  5 mg Oral Daily  . aspirin EC  81 mg Oral Daily  . atorvastatin  80 mg Oral q1800  . chlorhexidine  15 mL Mouth Rinse BID  .  clopidogrel  75 mg Oral Daily  . furosemide  60 mg Intravenous BID  . gabapentin  300 mg Oral TID  . heparin  5,000 Units Subcutaneous Q8H  . hydrALAZINE  5 mg Intravenous Q8H  . insulin aspart  0-9 Units Subcutaneous Q4H  . insulin glargine  8 Units Subcutaneous QHS  . isosorbide mononitrate  90 mg Oral Daily  . mouth rinse  15 mL Mouth Rinse q12n4p  . metoprolol succinate  75 mg Oral Daily  . pantoprazole  40 mg Oral Daily  . sodium chloride flush  3 mL Intravenous Q12H  . sodium polystyrene  30 g Oral Once   Continuous Infusions: . sodium chloride        LOS: 1 day   Marinda Elk  [facility]   *Please refer to [email or web address], password [email or web address] to get updated schedule on who will round on this patient, as hospitalists switch teams weekly. If 7PM-7AM, please contact night-coverage at [email or web address], password [email or web address] for any overnight needs.  02/14/2018, 7:05 AM

## 2018-02-14 NOTE — Progress Notes (Signed)
Paged [facility] provider due to patient meeting sepsis criteria. Per provider, will pass on to dayshift MD and to continue monitoring temperatures.   Will continue to monitor.   Pasty Arch

## 2018-02-15 LAB — BASIC METABOLIC PANEL
Anion gap: 10 (ref 5–15)
Anion gap: 11 (ref 5–15)
BUN: 32 mg/dL — ABNORMAL HIGH (ref 8–23)
BUN: 42 mg/dL — ABNORMAL HIGH (ref 8–23)
CALCIUM: 7.9 mg/dL — AB (ref 8.9–10.3)
CHLORIDE: 104 mmol/L (ref 98–111)
CHLORIDE: 96 mmol/L — AB (ref 98–111)
CO2: 22 mmol/L (ref 22–32)
CO2: 20 mmol/L — ABNORMAL LOW (ref 22–32)
Calcium: 7.8 mg/dL — ABNORMAL LOW (ref 8.9–10.3)
Creatinine, Ser: 1.52 mg/dL — ABNORMAL HIGH (ref 0.44–1.00)
Creatinine, Ser: 1.97 mg/dL — ABNORMAL HIGH (ref 0.44–1.00)
GFR calc Af Amer: 41 mL/min — ABNORMAL LOW (ref >60)
GFR calc Af Amer: 30 mL/min — ABNORMAL LOW (ref >60)
GFR calc non Af Amer: 35 mL/min — ABNORMAL LOW (ref >60)
GFR calc non Af Amer: 26 mL/min — ABNORMAL LOW (ref >60)
Glucose, Bld: 141 mg/dL — ABNORMAL HIGH (ref 70–99)
Glucose, Bld: 479 mg/dL — ABNORMAL HIGH (ref 70–99)
Potassium: 3.8 mmol/L (ref 3.5–5.1)
Potassium: 3.7 mmol/L (ref 3.5–5.1)
Sodium: 136 mmol/L (ref 135–145)
Sodium: 127 mmol/L — ABNORMAL LOW (ref 135–145)

## 2018-02-15 LAB — CBC
HCT: 24.9 % — ABNORMAL LOW (ref 36.0–46.0)
Hemoglobin: 8.5 g/dL — ABNORMAL LOW (ref 12.0–15.0)
MCH: 30 pg (ref 26.0–34.0)
MCHC: 34.1 g/dL (ref 30.0–36.0)
MCV: 88 fL (ref 80.0–100.0)
PLATELETS: 181 10*3/uL (ref 150–400)
RBC: 2.83 MIL/uL — AB (ref 3.87–5.11)
RDW: 14.3 % (ref 11.5–15.5)
WBC: 7.4 10*3/uL (ref 4.0–10.5)
nRBC: 0 % (ref 0.0–0.2)

## 2018-02-15 LAB — GLUCOSE, CAPILLARY
Glucose-Capillary: 118 mg/dL — ABNORMAL HIGH (ref 70–99)
Glucose-Capillary: 137 mg/dL — ABNORMAL HIGH (ref 70–99)
Glucose-Capillary: 150 mg/dL — ABNORMAL HIGH (ref 70–99)
Glucose-Capillary: 255 mg/dL — ABNORMAL HIGH (ref 70–99)
Glucose-Capillary: 353 mg/dL — ABNORMAL HIGH (ref 70–99)
Glucose-Capillary: 445 mg/dL — ABNORMAL HIGH (ref 70–99)

## 2018-02-15 MED ORDER — INSULIN ASPART 100 UNIT/ML ~~LOC~~ SOLN
15.0000 [IU] | Freq: Once | SUBCUTANEOUS | Status: AC
  Administered 2018-02-15: 15 [IU] via SUBCUTANEOUS

## 2018-02-15 MED ORDER — ALBUTEROL SULFATE (2.5 MG/3ML) 0.083% IN NEBU
2.5000 mg | INHALATION_SOLUTION | RESPIRATORY_TRACT | Status: DC | PRN
  Administered 2018-02-15 – 2018-02-17 (×2): 2.5 mg via RESPIRATORY_TRACT
  Filled 2018-02-15: qty 3

## 2018-02-15 MED ORDER — ALBUTEROL SULFATE (2.5 MG/3ML) 0.083% IN NEBU
2.5000 mg | INHALATION_SOLUTION | RESPIRATORY_TRACT | Status: DC
  Administered 2018-02-15 – 2018-02-18 (×19): 2.5 mg via RESPIRATORY_TRACT
  Filled 2018-02-15 (×20): qty 3

## 2018-02-15 MED ORDER — METHYLPREDNISOLONE SODIUM SUCC 40 MG IJ SOLR
40.0000 mg | Freq: Two times a day (BID) | INTRAMUSCULAR | Status: DC
  Administered 2018-02-15 – 2018-02-16 (×3): 40 mg via INTRAVENOUS
  Filled 2018-02-15 (×3): qty 1

## 2018-02-15 NOTE — Progress Notes (Signed)
[facility] PROGRESS NOTE    Progress Note  [name] [name]  OIB:[id number] DOB: 02/09/1950 DOA: 02/13/2018 PCP: Georgann Housekeeper, MD     Brief Narrative:   [name] [name] is an 68 y.o. female past medical history significant for CAD status post stent, PAD with bilateral aorto femoral bypass, essential hypertension, chronic systolic heart failure, insulin-dependent diabetes mellitus uncontrolled, also with a history of noncompliance presents to the emergency room in acute respiratory distress.  Assessment/Plan:   Acute respiratory failure with hypoxia due RSV/Sirs: CT of the chest done on 02/14/2018 show bilateral lower lobe infiltrates. She was started on IV empiric antibiotics and IV diuresis. Her respiratory panel came back positive for RSV. Diuresis was held due to borderline low blood pressure, influenza panel PCR was negative. Norvasc was discontinued. Estimated dry weight is around 80 kg on admission she was 84 kg. Monitor strict I's and O's Daily weights. -Baseline creatinine less than 1. Basic metabolic panel is pending this morning. She appears to be fluid overloaded blood pressure still tenuous we will hold the Lasix for today. She is deciding to 86%, when she sits up she is on 8 L high flow nasal cannula. We will keep her in the ICU. He is wheezing on physical exam and moving poor air with crackles, will start on low-dose IV steroids.  Acute kidney injury: Hold her ARB and Lasix, recent metabolic panel is pending.  CAD/ PAD (peripheral artery disease) (HCC): No anginal complaints, EKG shows sinus tachycardia, cardiac biomarkers are negative. Continue aspirin Plavix beta blockers and nitrates.  Insulin-dependent diabetes mellitus: With a last A1c of 7.6. Excellent control blood glucose.  Essential hypertension: Blood pressure is borderline low, will discontinue IV Lasix and Norvasc. Continue Toprol. Continue losartan hold losartan due to acute renal failure.     Hyperkalemia: Solved with a single dose of Kayexalate.  Leukocytosis: Resolved likely due to infectious etiology.   DVT prophylaxis: Lovenox Family Communication:none Disposition Plan/Barrier to D/C: Keep in the ICU. Code Status:     Code Status Orders  (From admission, onward)         Start     Ordered   02/13/18 0439  Full code  Continuous     02/13/18 0440        Code Status History    Date Active Date Inactive Code Status Order ID Comments User Context   10/17/2016 1327 10/28/2016 1949 Full Code [id number]  Runell Gess, MD Inpatient   10/31/2015 0151 11/14/2015 2143 Full Code [id number]  Jonah Blue, MD Inpatient   09/28/2015 1430 09/29/2015 1904 Full Code [id number]  Jolyn Nap, RN Inpatient   09/05/2015 1412 09/07/2015 1626 Full Code [id number]  Raymond Gurney, PA-C Inpatient   08/28/2015 1013 08/29/2015 2045 Full Code [id number]  Runell Gess, MD Inpatient   03/02/2013 2153 03/04/2013 1702 Full Code [id number]  Penny Pia, MD ED        IV Access:    Peripheral IV   Procedures and diagnostic studies:   Ct Chest Wo Contrast  Result Date: 02/14/2018 CLINICAL DATA:  Hypoxic, fever of unknown origin EXAM: CT CHEST WITHOUT CONTRAST TECHNIQUE: Multidetector CT imaging of the chest was performed following the standard protocol without IV contrast. COMPARISON:  None. FINDINGS: Cardiovascular: No significant vascular findings. Normal heart size. No pericardial effusion. Thoracic aortic atherosclerosis. Mediastinum/Nodes: Subcarinal lymphadenopathy measuring 2 cm in short axis. Multiple other smaller mediastinal lymph nodes. No axillary lymphadenopathy. Trachea, esophagus and thyroid are normal. Lungs/Pleura: Small bilateral  pleural effusions. Dense left lower lobe consolidation. Mild right lobe airspace disease. No pneumothorax. Patchy areas of ground-glass opacity in the left upper lobe. Upper Abdomen: No acute abnormality. Musculoskeletal: No acute osseous  abnormality. No aggressive osseous lesion. IMPRESSION: 1. Bilateral lower lobe pneumonia, left greater than right. Mild reactive mediastinal lymphadenopathy. Followup PA and lateral chest X-ray is recommended in 3-4 weeks following trial of antibiotic therapy to ensure resolution and exclude underlying malignancy. 2.  Aortic Atherosclerosis (ICD10-I70.0). Electronically Signed   By: Elige Ko   On: 02/14/2018 13:23     Medical Consultants:    None.  Anti-Infectives:   None  Subjective:    [name] [name] relates she still feels short of breath tired has remained afebrile with poor appetite.  She desatted to 86% when sitting up in the bed.  Objective:    Vitals:   02/15/18 0500 02/15/18 0600 02/15/18 0645 02/15/18 0700  BP: (!) 99/41 140/65  (!) 105/54  Pulse: 79 99 96 92  Resp: (!) 24 (!) 29 (!) 27 (!) 26  Temp:      TempSrc:      SpO2: 97% 97% 94% 96%  Weight:        Intake/Output Summary (Last 24 hours) at 02/15/2018 0713 Last data filed at 02/15/2018 0700 Gross per 24 hour  Intake 609.33 ml  Output 1050 ml  Net -440.67 ml   Filed Weights   02/14/18 0254 02/15/18 0400  Weight: 83.5 kg 85.8 kg    Exam: General exam: In no acute distress. Respiratory system: Air movement with wheezing bilaterally and crackles. Cardiovascular system: S1 & S2 heard, RRR.  Positive JVD Gastrointestinal system: Abdomen is nondistended, soft and nontender.  Central nervous system: Alert and oriented. No focal neurological deficits. Extremities: Hypertrophic skin, 3+ edema Skin: Thick hypertrophic skin Psychiatry: Judgement and insight appear normal. Mood & affect appropriate.    Data Reviewed:    Labs: Basic Metabolic Panel: Recent Labs  Lab 02/13/18 0152  02/14/18 0436 02/14/18 0747  NA 136  --  135 136  K 5.6*   < > 4.8 4.9  CL 105  --  101 102  CO2 19*  --  22 23  GLUCOSE 203*  --  186* 174*  BUN 33*  --  30* 29*  CREATININE 1.54*  --  1.53* 1.44*  CALCIUM 8.8*  --   8.2* 8.4*  MG  --   --  1.5*  --    < > = values in this interval not displayed.   GFR Estimated Creatinine Clearance: 40.2 mL/min (A) (by C-G formula based on SCr of 1.44 mg/dL (H)). Liver Function Tests: Recent Labs  Lab 02/13/18 0152 02/14/18 0747  AST 39 34  ALT 15 14  ALKPHOS 93 76  BILITOT 1.2 0.5  PROT 7.0 6.2*  ALBUMIN 3.1* 2.6*   No results for input(s): LIPASE, AMYLASE in the last 168 hours. No results for input(s): AMMONIA in the last 168 hours. Coagulation profile Recent Labs  Lab 02/14/18 0747  INR 1.25    CBC: Recent Labs  Lab 02/13/18 0152 02/14/18 0747  WBC 13.3* 8.2  NEUTROABS 10.5* 6.6  HGB 11.1* 9.3*  HCT 33.2* 28.0*  MCV 86.9 88.1  PLT 288 187   Cardiac Enzymes: Recent Labs  Lab 02/13/18 0152  TROPONINI <0.03   BNP (last 3 results) No results for input(s): PROBNP in the last 8760 hours. CBG: Recent Labs  Lab 02/14/18 1112 02/14/18 1520 02/14/18 1938 02/15/18 0009  02/15/18 0340  GLUCAP 155* 228* 141* 150* 137*   D-Dimer: No results for input(s): DDIMER in the last 72 hours. Hgb A1c: No results for input(s): HGBA1C in the last 72 hours. Lipid Profile: No results for input(s): CHOL, HDL, LDLCALC, TRIG, CHOLHDL, LDLDIRECT in the last 72 hours. Thyroid function studies: No results for input(s): TSH, T4TOTAL, T3FREE, THYROIDAB in the last 72 hours.  Invalid input(s): FREET3 Anemia work up: No results for input(s): VITAMINB12, FOLATE, FERRITIN, TIBC, IRON, RETICCTPCT in the last 72 hours. Sepsis Labs: Recent Labs  Lab 02/13/18 0152 02/14/18 0747 02/14/18 0748 02/14/18 0958  PROCALCITON  --  0.16  --   --   WBC 13.3* 8.2  --   --   LATICACIDVEN  --   --  1.4 0.9   Microbiology Recent Results (from the past 240 hour(s))  Respiratory Panel by PCR     Status: Abnormal   Collection Time: 02/14/18 11:23 AM  Result Value Ref Range Status   Adenovirus NOT DETECTED NOT DETECTED Final   Coronavirus 229E NOT DETECTED NOT DETECTED  Final   Coronavirus HKU1 NOT DETECTED NOT DETECTED Final   Coronavirus NL63 NOT DETECTED NOT DETECTED Final   Coronavirus OC43 NOT DETECTED NOT DETECTED Final   Metapneumovirus NOT DETECTED NOT DETECTED Final   Rhinovirus / Enterovirus NOT DETECTED NOT DETECTED Final   Influenza A NOT DETECTED NOT DETECTED Final   Influenza B NOT DETECTED NOT DETECTED Final   Parainfluenza Virus 1 NOT DETECTED NOT DETECTED Final   Parainfluenza Virus 2 NOT DETECTED NOT DETECTED Final   Parainfluenza Virus 3 NOT DETECTED NOT DETECTED Final   Parainfluenza Virus 4 NOT DETECTED NOT DETECTED Final   Respiratory Syncytial Virus DETECTED (A) NOT DETECTED Final    Comment: CRITICAL RESULT CALLED TO, READ BACK BY AND VERIFIED WITH: RN J PERRIN [id number] MLM    Bordetella pertussis NOT DETECTED NOT DETECTED Final   Chlamydophila pneumoniae NOT DETECTED NOT DETECTED Final   Mycoplasma pneumoniae NOT DETECTED NOT DETECTED Final    Comment: Performed at [facility], [address], [place], [place] [address]     Medications:   . amLODipine  5 mg Oral Daily  . aspirin EC  81 mg Oral Daily  . atorvastatin  80 mg Oral q1800  . chlorhexidine  15 mL Mouth Rinse BID  . clopidogrel  75 mg Oral Daily  . furosemide  40 mg Intravenous Daily  . gabapentin  300 mg Oral TID  . heparin  5,000 Units Subcutaneous Q8H  . insulin aspart  0-9 Units Subcutaneous Q4H  . insulin glargine  8 Units Subcutaneous QHS  . mouth rinse  15 mL Mouth Rinse q12n4p  . metoprolol succinate  75 mg Oral Daily  . pantoprazole  40 mg Oral Daily  . sodium chloride flush  3 mL Intravenous Q12H   Continuous Infusions: . sodium chloride    . ceFEPime (MAXIPIME) IV 200 mL/hr at 02/15/18 0700  . [START ON 02/16/2018] vancomycin        LOS: 2 days   Marinda Elk  [facility]   *Please refer to [email or web address], password [email or web address] to get updated schedule on who will round on this patient, as hospitalists switch teams weekly. If  7PM-7AM, please contact night-coverage at [email or web address], password [email or web address] for any overnight needs.  02/15/2018, 7:13 AM

## 2018-02-16 LAB — CBC
HCT: 24.6 % — ABNORMAL LOW (ref 36.0–46.0)
Hemoglobin: 8.2 g/dL — ABNORMAL LOW (ref 12.0–15.0)
MCH: 28.9 pg (ref 26.0–34.0)
MCHC: 33.3 g/dL (ref 30.0–36.0)
MCV: 86.6 fL (ref 80.0–100.0)
Platelets: 178 10*3/uL (ref 150–400)
RBC: 2.84 MIL/uL — ABNORMAL LOW (ref 3.87–5.11)
RDW: 13.6 % (ref 11.5–15.5)
WBC: 5.3 10*3/uL (ref 4.0–10.5)
nRBC: 0 % (ref 0.0–0.2)

## 2018-02-16 LAB — BASIC METABOLIC PANEL
Anion gap: 12 (ref 5–15)
BUN: 45 mg/dL — ABNORMAL HIGH (ref 8–23)
CO2: 22 mmol/L (ref 22–32)
Calcium: 8.2 mg/dL — ABNORMAL LOW (ref 8.9–10.3)
Chloride: 100 mmol/L (ref 98–111)
Creatinine, Ser: 1.97 mg/dL — ABNORMAL HIGH (ref 0.44–1.00)
GFR calc Af Amer: 30 mL/min — ABNORMAL LOW (ref >60)
GFR calc non Af Amer: 26 mL/min — ABNORMAL LOW (ref >60)
Glucose, Bld: 439 mg/dL — ABNORMAL HIGH (ref 70–99)
Potassium: 4.1 mmol/L (ref 3.5–5.1)
Sodium: 134 mmol/L — ABNORMAL LOW (ref 135–145)

## 2018-02-16 LAB — GLUCOSE, CAPILLARY
GLUCOSE-CAPILLARY: 343 mg/dL — AB (ref 70–99)
Glucose-Capillary: 299 mg/dL — ABNORMAL HIGH (ref 70–99)
Glucose-Capillary: 316 mg/dL — ABNORMAL HIGH (ref 70–99)
Glucose-Capillary: 319 mg/dL — ABNORMAL HIGH (ref 70–99)
Glucose-Capillary: 327 mg/dL — ABNORMAL HIGH (ref 70–99)
Glucose-Capillary: 348 mg/dL — ABNORMAL HIGH (ref 70–99)
Glucose-Capillary: 365 mg/dL — ABNORMAL HIGH (ref 70–99)
Glucose-Capillary: 424 mg/dL — ABNORMAL HIGH (ref 70–99)
Glucose-Capillary: 439 mg/dL — ABNORMAL HIGH (ref 70–99)

## 2018-02-16 MED ORDER — VANCOMYCIN HCL 10 G IV SOLR: 1250.0000 mg | INTRAVENOUS | Status: DC

## 2018-02-16 MED ORDER — INSULIN REGULAR(HUMAN) IN NACL 100-0.9 UT/100ML-% IV SOLN: INTRAVENOUS | Status: DC

## 2018-02-16 MED ORDER — GUAIFENESIN ER 600 MG PO TB12
1200.0000 mg | ORAL_TABLET | Freq: Two times a day (BID) | ORAL | Status: DC
  Administered 2018-02-16 – 2018-02-21 (×10): 1200 mg via ORAL
  Filled 2018-02-16 (×10): qty 2

## 2018-02-16 MED ORDER — INSULIN ASPART 100 UNIT/ML ~~LOC~~ SOLN
18.0000 [IU] | Freq: Once | SUBCUTANEOUS | Status: AC
  Administered 2018-02-16: 18 [IU] via SUBCUTANEOUS

## 2018-02-16 MED ORDER — INSULIN ASPART 100 UNIT/ML ~~LOC~~ SOLN
0.0000 [IU] | Freq: Three times a day (TID) | SUBCUTANEOUS | Status: DC
  Administered 2018-02-16: 7 [IU] via SUBCUTANEOUS
  Administered 2018-02-17 (×2): 3 [IU] via SUBCUTANEOUS

## 2018-02-16 MED ORDER — INSULIN ASPART 100 UNIT/ML ~~LOC~~ SOLN
15.0000 [IU] | Freq: Once | SUBCUTANEOUS | Status: AC
  Administered 2018-02-16: 15 [IU] via SUBCUTANEOUS

## 2018-02-16 MED ORDER — INSULIN GLARGINE 100 UNIT/ML ~~LOC~~ SOLN
8.0000 [IU] | Freq: Two times a day (BID) | SUBCUTANEOUS | Status: DC
  Administered 2018-02-16 (×2): 8 [IU] via SUBCUTANEOUS
  Filled 2018-02-16 (×3): qty 0.08

## 2018-02-16 NOTE — Progress Notes (Signed)
Pharmacy Antibiotic Note  [name] [name] is a 68 y.o. female admitted on 02/13/2018 with respiratory distress.  Pharmacy has been consulted for vancomycin and cefepime dosing for possible sepsis.   Pt is now afebrile. SCr up to 1.97.   Vancomycin 1250 mg IV Q 36 hrs. Goal AUC 400-550. Expected AUC: 510 SCr used: 1.97  Plan: Increase vancomycin to 1250 mg IV Q 36 hours Continue Cefepime 2 gm IV Q 24 hours   Weight: 196 lb 6.9 oz (89.1 kg)  Temp (24hrs), Avg:98.1 F (36.7 C), Min:97.5 F (36.4 C), Max:98.8 F (37.1 C)  Recent Labs  Lab 02/13/18 0152 02/14/18 0436 02/14/18 0747 02/14/18 0748 02/14/18 0958 02/15/18 0538 02/15/18 2131 02/16/18 0222  WBC 13.3*  --  8.2  --   --  7.4  --  5.3  CREATININE 1.54* 1.53* 1.44*  --   --  1.52* 1.97* 1.97*  LATICACIDVEN  --   --   --  1.4 0.9  --   --   --     Estimated Creatinine Clearance: 30 mL/min (A) (by C-G formula based on SCr of 1.97 mg/dL (H)).    Allergies  Allergen Reactions  . Doxycycline Other (See Comments)    lethargy  . Hydrochlorothiazide Other (See Comments)    lethargic   . Latex Rash  . Penicillins Swelling and Rash    Pt states she has tolerated Keflex in the past without problems. States she may have tolerated Augmentin in the past but it caused GI upset. Has patient had a PCN reaction causing immediate rash, facial/tongue/throat swelling, SOB or lightheadedness with hypotension: Yes Has patient had a PCN reaction causing severe rash involving mucus membranes or skin necrosis: No Has patient had a PCN reaction that required hospitalization No Has patient had a PCN reaction occurring within the last 10 years: No    Thank you for allowing pharmacy to be a part of this patient's care.  Vinnie Level, PharmD., BCPS Clinical Pharmacist Clinical phone for 02/16/18 until 3:30pm: [phone] If after 3:30pm, please refer to Loretha Stapler /for unit-specific pharmacist

## 2018-02-16 NOTE — Progress Notes (Signed)
Received patient from 2H,patient alert and oriented.Agree with previous Rn's assessment.

## 2018-02-16 NOTE — Progress Notes (Signed)
Patients blood sugars have remained in the 400's. [facility] provider notified and new orders followed each time paged.   See MAR for interventions.   Pasty Arch, RN

## 2018-02-16 NOTE — Progress Notes (Addendum)
Inpatient Diabetes Program Recommendations  AACE/ADA: New Consensus Statement on Inpatient Glycemic Control (2015)  Target Ranges:  Prepandial:   less than 140 mg/dL      Peak postprandial:   less than 180 mg/dL (1-2 hours)      Critically ill patients:  140 - 180 mg/dL   Lab Results  Component Value Date   GLUCAP 316 (H) 02/16/2018   HGBA1C 7.6 (H) 11/02/2015    Results for AKHIA, CARRAS (MRN [id number]) as of 02/16/2018 14:31  Ref. Range 02/16/2018 2131 02/16/2018 02/16/2018 01:03 02/16/2018 04:07 02/16/2018 05:45 02/16/2018 07:44 02/16/2018 11:32 02/16/2018 12:09  Glucose-Capillary Latest Ref Range: 70 - 99 mg/dL 725  Novolog 15 units one time order given at 2321  424 (H)   439 (H)  Novolog 15 units one time order given at 0126 365 (H)  Novolog 18 units one time order given at 0425 343 (H) 299 (H)  Novolog 5 units per scale  319 (H) 316 (H)  Novolog 7 units per scale    DM2   Home DM meds: Levemir 35 units hs                             Glucophage 1000mg  BID   Current DM inpatient meds: Lantus 8 units BID                                                Novolog sensitive (0-9 units) Q 4 hours    Last dose of Solumedrol 40 mg Q 12 hours was given at 0630 this am. CBG hopefully will decrease as steroids discontinued today and Lantus increased to BID today.   Patient had three large (one time ordered) doses of Novolog during the night totaling 48 units of Novolog.     MD please consider the following inpatient glycemic control recommendations:   1. Order Hgb A1c . Last one of 7.6% was in 2017.  2. Add carb modified to diet order  3. Change Novolog correction to moderate scale (0-15 units) tid ac and hs    Thank you.  -- Will follow during hospitalization.--  Jamelle Rushing RN, MSN Diabetes Coordinator Inpatient Glycemic Control Team Team Pager: [phone] (8am-5pm)

## 2018-02-16 NOTE — Progress Notes (Signed)
[facility] PROGRESS NOTE    Progress Note  AMYRI LOCOCO  YIF:[id number] DOB: Dec 10, 1950 DOA: 02/13/2018 PCP: Georgann Housekeeper, MD     Brief Narrative:   KAREM WEEDMAN is an 68 y.o. female past medical history significant for CAD status post stent, PAD with bilateral aorto femoral bypass, essential hypertension, chronic systolic heart failure, insulin-dependent diabetes mellitus uncontrolled, also with a history of noncompliance presents to the emergency room in acute respiratory distress.  Requiring BiPAP initially and oxygen.  Assessment/Plan:   Acute hypoxic respiratory failure -Multifactorial, mainly secondary to by basilar pneumonia, mild COPD and slight volume overload. -Required BiPAP initially, her baseline on room air, she is still on oxygen 2 L nasal cannula, will wean as tolerated. -We will start on incentive spirometry, flutter valve and Mucinex. - No Diuresis in setting of AKI - Her respiratory panel came back positive for RSV.  Bibaselar Pneumonia -He is afebrile, no leukocytosis, empirically on vancomycin and cefepime, improving, will DC vancomycin continue cefepime  Acute kidney injury: - Hold her ARB and Lasix,   CAD/ PAD (peripheral artery disease) (HCC): - No anginal complaints, EKG shows sinus tachycardia, cardiac biomarkers are negative. - Continue aspirin Plavix beta blockers and nitrates.  Insulin-dependent diabetes mellitus: Type II With a last A1c of 7.6. CBG extremely uncontrolled after initiation of steroids, otherwise has been controlled, I have doubled her Lantus dose, and stopped her steroids  Essential hypertension: Blood pressure is borderline low, will discontinue IV Lasix and Norvasc. Continue Toprol. Continue to hold losartan due to acute renal failure.   Hyperkalemia: Solved with a single dose of Kayexalate.    DVT prophylaxis: Lovenox Family Communication:none Disposition Plan/Barrier to D/C: transfer  to medical floor, will  consult PT Code Status:     Code Status Orders  (From admission, onward)         Start     Ordered   02/13/18 0439  Full code  Continuous     02/13/18 0440        Code Status History    Date Active Date Inactive Code Status Order ID Comments User Context   10/17/2016 1327 10/28/2016 1949 Full Code [id number]  Runell Gess, MD Inpatient   10/31/2015 0151 11/14/2015 2143 Full Code [id number]  Jonah Blue, MD Inpatient   09/28/2015 1430 09/29/2015 1904 Full Code [id number]  Jolyn Nap, RN Inpatient   09/05/2015 1412 09/07/2015 1626 Full Code [id number]  Raymond Gurney, PA-C Inpatient   08/28/2015 1013 08/29/2015 2045 Full Code [id number]  Runell Gess, MD Inpatient   03/02/2013 2153 03/04/2013 1702 Full Code [id number]  Penny Pia, MD ED        IV Access:    Peripheral IV   Procedures and diagnostic studies:   Ct Chest Wo Contrast  Result Date: 02/14/2018 CLINICAL DATA:  Hypoxic, fever of unknown origin EXAM: CT CHEST WITHOUT CONTRAST TECHNIQUE: Multidetector CT imaging of the chest was performed following the standard protocol without IV contrast. COMPARISON:  None. FINDINGS: Cardiovascular: No significant vascular findings. Normal heart size. No pericardial effusion. Thoracic aortic atherosclerosis. Mediastinum/Nodes: Subcarinal lymphadenopathy measuring 2 cm in short axis. Multiple other smaller mediastinal lymph nodes. No axillary lymphadenopathy. Trachea, esophagus and thyroid are normal. Lungs/Pleura: Small bilateral pleural effusions. Dense left lower lobe consolidation. Mild right lobe airspace disease. No pneumothorax. Patchy areas of ground-glass opacity in the left upper lobe. Upper Abdomen: No acute abnormality. Musculoskeletal: No acute osseous abnormality. No aggressive osseous lesion. IMPRESSION: 1. Bilateral lower lobe  pneumonia, left greater than right. Mild reactive mediastinal lymphadenopathy. Followup PA and lateral chest X-ray is recommended in 3-4 weeks  following trial of antibiotic therapy to ensure resolution and exclude underlying malignancy. 2.  Aortic Atherosclerosis (ICD10-I70.0). Electronically Signed   By: Elige KoHetal  Patel   On: 02/14/2018 13:23     Medical Consultants:    None.  Anti-Infectives:   None  Subjective:    [name] Ageonie N [place] for she is feeling a lot better today, dyspnea has improved, remains on 2 L nasal cannula   Objective:    Vitals:   02/16/18 1100 02/16/18 1130 02/16/18 1134 02/16/18 1217  BP: (!) 141/69 (!) 141/69  137/68  Pulse: 84 79  88  Resp: 16 18  (!) 22  Temp:   (!) 97.5 F (36.4 C) (!) 97.4 F (36.3 C)  TempSrc:   Axillary Oral  SpO2: 97%   98%  Weight:        Intake/Output Summary (Last 24 hours) at 02/16/2018 1235 Last data filed at 02/16/2018 1000 Gross per 24 hour  Intake 439.88 ml  Output 550 ml  Net -110.12 ml   Filed Weights   02/14/18 0254 02/15/18 0400 02/16/18 0436  Weight: 83.5 kg 85.8 kg 89.1 kg    Exam:  Awake Alert, Oriented X 3, No new F.N deficits, Normal affect Symmetrical Chest wall movement, Good air movement bilaterally, bibasilar Rales RRR,No Gallops,Rubs or new Murmurs, No Parasternal Heave +ve B.Sounds, Abd Soft, No tenderness, No rebound - guarding or rigidity. No Cyanosis, Clubbing , +1 edema(reports she has chronic edema, and recurrent swelling actually better than her baseline), No new Rash or bruise      Data Reviewed:    Labs: Basic Metabolic Panel: Recent Labs  Lab 02/14/18 0436 02/14/18 0747 02/15/18 0538 02/15/18 2131 02/16/18 0222  NA 135 136 136 127* 134*  K 4.8 4.9 3.8 3.7 4.1  CL 101 102 104 96* 100  CO2 22 23 22  20* 22  GLUCOSE 186* 174* 141* 479* 439*  BUN 30* 29* 32* 42* 45*  CREATININE 1.53* 1.44* 1.52* 1.97* 1.97*  CALCIUM 8.2* 8.4* 7.9* 7.8* 8.2*  MG 1.5*  --   --   --   --    GFR Estimated Creatinine Clearance: 30 mL/min (A) (by C-G formula based on SCr of 1.97 mg/dL (H)). Liver Function Tests: Recent Labs  Lab  02/13/18 0152 02/14/18 0747  AST 39 34  ALT 15 14  ALKPHOS 93 76  BILITOT 1.2 0.5  PROT 7.0 6.2*  ALBUMIN 3.1* 2.6*   No results for input(s): LIPASE, AMYLASE in the last 168 hours. No results for input(s): AMMONIA in the last 168 hours. Coagulation profile Recent Labs  Lab 02/14/18 0747  INR 1.25    CBC: Recent Labs  Lab 02/13/18 0152 02/14/18 0747 02/15/18 0538 02/16/18 0222  WBC 13.3* 8.2 7.4 5.3  NEUTROABS 10.5* 6.6  --   --   HGB 11.1* 9.3* 8.5* 8.2*  HCT 33.2* 28.0* 24.9* 24.6*  MCV 86.9 88.1 88.0 86.6  PLT 288 187 181 178   Cardiac Enzymes: Recent Labs  Lab 02/13/18 0152  TROPONINI <0.03   BNP (last 3 results) No results for input(s): PROBNP in the last 8760 hours. CBG: Recent Labs  Lab 02/16/18 0407 02/16/18 0545 02/16/18 0744 02/16/18 1132 02/16/18 1209  GLUCAP 365* 343* 299* 319* 316*   D-Dimer: No results for input(s): DDIMER in the last 72 hours. Hgb A1c: No results for input(s): HGBA1C in the  last 72 hours. Lipid Profile: No results for input(s): CHOL, HDL, LDLCALC, TRIG, CHOLHDL, LDLDIRECT in the last 72 hours. Thyroid function studies: No results for input(s): TSH, T4TOTAL, T3FREE, THYROIDAB in the last 72 hours.  Invalid input(s): FREET3 Anemia work up: No results for input(s): VITAMINB12, FOLATE, FERRITIN, TIBC, IRON, RETICCTPCT in the last 72 hours. Sepsis Labs: Recent Labs  Lab 02/13/18 0152 02/14/18 0747 02/14/18 0748 02/14/18 0958 02/15/18 0538 02/16/18 0222  PROCALCITON  --  0.16  --   --   --   --   WBC 13.3* 8.2  --   --  7.4 5.3  LATICACIDVEN  --   --  1.4 0.9  --   --    Microbiology Recent Results (from the past 240 hour(s))  Culture, blood (x 2)     Status: None (Preliminary result)   Collection Time: 02/14/18  7:40 AM  Result Value Ref Range Status   Specimen Description BLOOD RIGHT ANTECUBITAL  Final   Special Requests   Final    BOTTLES DRAWN AEROBIC ONLY Blood Culture results may not be optimal due to  an inadequate volume of blood received in culture bottles   Culture   Final    NO GROWTH 2 DAYS Performed at [facility], [address], [place], [place] [address]    Report Status PENDING  Incomplete  Culture, blood (x 2)     Status: None (Preliminary result)   Collection Time: 02/14/18  7:48 AM  Result Value Ref Range Status   Specimen Description BLOOD RIGHT ANTECUBITAL  Final   Special Requests   Final    BOTTLES DRAWN AEROBIC ONLY Blood Culture adequate volume   Culture   Final    NO GROWTH 2 DAYS Performed at [facility], [address] [place], [place] [address]    Report Status PENDING  Incomplete  Respiratory Panel by PCR     Status: Abnormal   Collection Time: 02/14/18 11:23 AM  Result Value Ref Range Status   Adenovirus NOT DETECTED NOT DETECTED Final   Coronavirus 229E NOT DETECTED NOT DETECTED Final   Coronavirus HKU1 NOT DETECTED NOT DETECTED Final   Coronavirus NL63 NOT DETECTED NOT DETECTED Final   Coronavirus OC43 NOT DETECTED NOT DETECTED Final   Metapneumovirus NOT DETECTED NOT DETECTED Final   Rhinovirus / Enterovirus NOT DETECTED NOT DETECTED Final   Influenza A NOT DETECTED NOT DETECTED Final   Influenza B NOT DETECTED NOT DETECTED Final   Parainfluenza Virus 1 NOT DETECTED NOT DETECTED Final   Parainfluenza Virus 2 NOT DETECTED NOT DETECTED Final   Parainfluenza Virus 3 NOT DETECTED NOT DETECTED Final   Parainfluenza Virus 4 NOT DETECTED NOT DETECTED Final   Respiratory Syncytial Virus DETECTED (A) NOT DETECTED Final    Comment: CRITICAL RESULT CALLED TO, READ BACK BY AND VERIFIED WITH: RN J PERRIN [id number] MLM    Bordetella pertussis NOT DETECTED NOT DETECTED Final   Chlamydophila pneumoniae NOT DETECTED NOT DETECTED Final   Mycoplasma pneumoniae NOT DETECTED NOT DETECTED Final    Comment: Performed at [facility], [address], [place], [place] [address]     Medications:   . albuterol  2.5 mg Nebulization Q4H  .  aspirin EC  81 mg Oral Daily  . atorvastatin  80 mg Oral q1800  . chlorhexidine  15 mL Mouth Rinse BID  . clopidogrel  75 mg Oral Daily  . gabapentin  300 mg Oral TID  .  guaiFENesin  1,200 mg Oral BID  . heparin  5,000 Units Subcutaneous Q8H  . insulin aspart  0-9 Units Subcutaneous Q4H  . insulin glargine  8 Units Subcutaneous BID  . mouth rinse  15 mL Mouth Rinse q12n4p  . metoprolol succinate  75 mg Oral Daily  . pantoprazole  40 mg Oral Daily  . sodium chloride flush  3 mL Intravenous Q12H   Continuous Infusions: . sodium chloride    . ceFEPime (MAXIPIME) IV 2 g (02/16/18 0636)  . [START ON 02/17/2018] vancomycin        LOS: 3 days   Huey Bienenstock MD [facility]   *Please refer to [email or web address], password [email or web address] to get updated schedule on who will round on this patient, as hospitalists switch teams weekly. If 7PM-7AM, please contact night-coverage at [email or web address], password [email or web address] for any overnight needs.  02/16/2018, 12:35 PM

## 2018-02-17 ENCOUNTER — Inpatient Hospital Stay (HOSPITAL_COMMUNITY): Payer: Medicare Other

## 2018-02-17 LAB — BASIC METABOLIC PANEL
Anion gap: 11 (ref 5–15)
BUN: 59 mg/dL — ABNORMAL HIGH (ref 8–23)
CO2: 22 mmol/L (ref 22–32)
Calcium: 8.6 mg/dL — ABNORMAL LOW (ref 8.9–10.3)
Chloride: 102 mmol/L (ref 98–111)
Creatinine, Ser: 1.74 mg/dL — ABNORMAL HIGH (ref 0.44–1.00)
GFR calc non Af Amer: 30 mL/min — ABNORMAL LOW (ref >60)
GFR, EST AFRICAN AMERICAN: 35 mL/min — AB (ref >60)
Glucose, Bld: 264 mg/dL — ABNORMAL HIGH (ref 70–99)
Potassium: 4.2 mmol/L (ref 3.5–5.1)
Sodium: 135 mmol/L (ref 135–145)

## 2018-02-17 LAB — GLUCOSE, CAPILLARY
Glucose-Capillary: 225 mg/dL — ABNORMAL HIGH (ref 70–99)
Glucose-Capillary: 224 mg/dL — ABNORMAL HIGH (ref 70–99)
Glucose-Capillary: 249 mg/dL — ABNORMAL HIGH (ref 70–99)
Glucose-Capillary: 305 mg/dL — ABNORMAL HIGH (ref 70–99)

## 2018-02-17 LAB — BLOOD GAS, ARTERIAL
Acid-base deficit: 3.2 mmol/L — ABNORMAL HIGH (ref 0.0–2.0)
BICARBONATE: 22.3 mmol/L (ref 20.0–28.0)
Drawn by: 54666
O2 Content: 3 L/min
O2 Saturation: 89.6 %
PO2 ART: 59.9 mmHg — AB (ref 83.0–108.0)
Patient temperature: 98.6
pCO2 arterial: 47.3 mmHg (ref 32.0–48.0)
pH, Arterial: 7.295 — ABNORMAL LOW (ref 7.350–7.450)

## 2018-02-17 MED ORDER — METHYLPREDNISOLONE SODIUM SUCC 125 MG IJ SOLR
125.0000 mg | Freq: Once | INTRAMUSCULAR | Status: AC
  Administered 2018-02-17: 125 mg via INTRAVENOUS

## 2018-02-17 MED ORDER — ACETAMINOPHEN 650 MG RE SUPP
650.0000 mg | Freq: Four times a day (QID) | RECTAL | Status: DC | PRN
  Administered 2018-02-17: 650 mg via RECTAL
  Filled 2018-02-17: qty 1

## 2018-02-17 MED ORDER — FUROSEMIDE 10 MG/ML IJ SOLN
80.0000 mg | Freq: Once | INTRAMUSCULAR | Status: AC
  Administered 2018-02-17: 80 mg via INTRAVENOUS

## 2018-02-17 MED ORDER — INSULIN ASPART 100 UNIT/ML ~~LOC~~ SOLN
0.0000 [IU] | Freq: Three times a day (TID) | SUBCUTANEOUS | Status: DC
  Administered 2018-02-17: 5 [IU] via SUBCUTANEOUS
  Administered 2018-02-18: 15 [IU] via SUBCUTANEOUS
  Administered 2018-02-18: 11 [IU] via SUBCUTANEOUS
  Administered 2018-02-18: 8 [IU] via SUBCUTANEOUS
  Administered 2018-02-19 (×2): 5 [IU] via SUBCUTANEOUS
  Administered 2018-02-19: 3 [IU] via SUBCUTANEOUS
  Administered 2018-02-20: 2 [IU] via SUBCUTANEOUS
  Administered 2018-02-21: 3 [IU] via SUBCUTANEOUS

## 2018-02-17 MED ORDER — METHYLPREDNISOLONE SODIUM SUCC 125 MG IJ SOLR
60.0000 mg | Freq: Three times a day (TID) | INTRAMUSCULAR | Status: DC
  Administered 2018-02-17 – 2018-02-18 (×3): 60 mg via INTRAVENOUS
  Filled 2018-02-17 (×3): qty 2

## 2018-02-17 MED ORDER — FUROSEMIDE 10 MG/ML IJ SOLN
40.0000 mg | Freq: Every day | INTRAMUSCULAR | Status: DC
  Administered 2018-02-18 – 2018-02-19 (×2): 40 mg via INTRAVENOUS
  Filled 2018-02-17 (×2): qty 4

## 2018-02-17 MED ORDER — INSULIN GLARGINE 100 UNIT/ML ~~LOC~~ SOLN
12.0000 [IU] | Freq: Two times a day (BID) | SUBCUTANEOUS | Status: DC
  Administered 2018-02-17 – 2018-02-18 (×4): 12 [IU] via SUBCUTANEOUS
  Filled 2018-02-17 (×5): qty 0.12

## 2018-02-17 MED ORDER — INSULIN ASPART 100 UNIT/ML ~~LOC~~ SOLN
0.0000 [IU] | Freq: Every day | SUBCUTANEOUS | Status: DC
  Administered 2018-02-17: 4 [IU] via SUBCUTANEOUS
  Administered 2018-02-18: 3 [IU] via SUBCUTANEOUS
  Administered 2018-02-19: 2 [IU] via SUBCUTANEOUS
  Administered 2018-02-20: 3 [IU] via SUBCUTANEOUS

## 2018-02-17 MED ORDER — FUROSEMIDE 10 MG/ML IJ SOLN
INTRAMUSCULAR | Status: AC
  Filled 2018-02-17: qty 8

## 2018-02-17 MED ORDER — SODIUM CHLORIDE 0.9 % IV SOLN
500.0000 mg | INTRAVENOUS | Status: DC
  Administered 2018-02-17 – 2018-02-21 (×5): 500 mg via INTRAVENOUS
  Filled 2018-02-17 (×6): qty 500

## 2018-02-17 MED ORDER — INSULIN ASPART 100 UNIT/ML ~~LOC~~ SOLN
4.0000 [IU] | Freq: Three times a day (TID) | SUBCUTANEOUS | Status: DC
  Administered 2018-02-17 – 2018-02-21 (×13): 4 [IU] via SUBCUTANEOUS

## 2018-02-17 MED ORDER — METHYLPREDNISOLONE SODIUM SUCC 125 MG IJ SOLR
INTRAMUSCULAR | Status: AC
  Filled 2018-02-17: qty 2

## 2018-02-17 NOTE — Progress Notes (Signed)
PT Cancellation Note  Patient Details Name: [name] [name] MRN: [id number] DOB: 10-07-1950   Cancelled Treatment:    Reason Eval/Treat Not Completed: Patient not medically ready. Transfer to 6E this morning with change in respiratory status. Will follow-up for PT evaluation as appropriate.  Ina Homes, PT, DPT Acute Rehabilitation Services  Pager [phone] Office [phone]  Malachy Chamber 02/17/2018, 8:33 AM

## 2018-02-17 NOTE — Progress Notes (Signed)
Pt having Bilateral expiratory wheezes, c/o SOB, O2 sat 95% on 2L [place], albuterol neb given with minimal relief. PCCM on call notified, awaiting call back.

## 2018-02-17 NOTE — Significant Event (Signed)
Rapid Response Event Note  Overview: Called for patient needing Bipap Time Called: 0715 Arrival Time: 0720 Event Type: Respiratory  Initial Focused Assessment:  Alert warm and dry- oriented - speaking full sentences - denies pain or SOB at this point - states "I feel much better now - I could not breathe".  Bil BS present - exp wheezing. Abd soft.  Generalized edema.  BP 110/73 HR 110 ST  RR 24 on Bipap 14/6 at 40%.  O2 sats 98%.   Interventions:  Stat PCXR and ABG done per MD order.  Dr. June Leap to bedside.  Solumedrol and Lasix IV ordered. Order for transfer to progressive for Bipap. ABG results to MD.  Patient resting comfortably - states she feels great now.   Report called by Landry Corporal.    Plan of Care (if not transferred):  Transferred to [phone] with Bipap - tol well.  Awake alert conversing well.  Handoff to [employer].   Event Summary: Name of Physician Notified: Dr. Randol Kern at 0700    at    Outcome: Transferred (Comment)(progressive care)     [place]

## 2018-02-17 NOTE — Progress Notes (Signed)
RT Note:  Received call from [place], RN this morning right before shift change.  She stated that the patient's wheezing was worse and that the patient seemed more lethargic.  We had been talking about how the patient was not quite able to finish complete sentences last night without taking a breath, but the patient kept refusing Bipap and really did not want to go back on the machine.  After assessing the patient this morning, I felt the patient needed Bipap.  It was hooked up and patient placed on.  Patient's sats immediately went from 80s to 97 and 98%.  RT will pass information on to morning RT and the patient will continue to be monitored.

## 2018-02-17 NOTE — Progress Notes (Signed)
Placed pt on 4lpm HFNC.  Will continue to monitor.

## 2018-02-17 NOTE — Progress Notes (Signed)
Bipap removed by RT to obtain ABG. Pt currently will stay on 3L [place] for 30 minutes so ABG can be drawn without Bipap.

## 2018-02-17 NOTE — Progress Notes (Signed)
Paged Dr. Randol Kern Notified and ordered stat ABG, and chest xray. RT notified.

## 2018-02-17 NOTE — Progress Notes (Signed)
[facility] PROGRESS NOTE    Progress Note  [name] [name]  XBD:[id number] DOB: 07-18-1950 DOA: 02/13/2018 PCP: Georgann Housekeeper, MD     Brief Narrative:   [name] [name] is an 68 y.o. female past medical history significant for CAD status post stent, PAD with bilateral aorto femoral bypass, essential hypertension, chronic systolic heart failure, insulin-dependent diabetes mellitus uncontrolled, also with a history of noncompliance presents to the emergency room in acute respiratory distress.  Requiring BiPAP initially and oxygen.  Continues to improve, transferred out of ICU 02/16/2018, running a.m., patient in significant respiratory distress, she was put back on BiPAP and transferred to stepdown floor.  Subjective:    [name] [name] reports she had a good night sleep, significant respiratory distress this morning, rapid response was called, back on bike path, improved with IV Lasix and Solu-Medrol, and back on stepdown.  Fever 102.1 today   Assessment/Plan:   Acute hypoxic respiratory failure -Multifactorial, mainly secondary to Bibasilar pneumonia, exacerbation, and mild volume overload . -Required BiPAP initially, has improved, was on 2 L nasal cannula, this morning, with increased work of breathing hypoxic, she went again on BiPAP, , had some improvement with IV Lasix and steroids , to take off BiPAP later during the day .  Bibaselar Pneumonia -Is febrile this morning, chest x-ray showing improvement of opacity, she is on vancomycin, continue cefepime, I will add azithromycin, will try to send sputum cultures . -We will patient is positive for RSV  COPD exacerbation -He is with significant wheezing today, I have stopped her steroids yesterday giving her hyperglycemia, but given her respiratory compensation, she is back Solu-Medrol 80 mg IV every 8 hours .  Acute on chronic combined systolic/diastolic CHF -Echo significant for EF 45%, with grade 2 diastolic dysfunction, Lasix was  on hold initially given elevation of creatinine, slightly improved today, but given her respiratory compensation, she is back on Lasix  Acute kidney injury: - Hold her ARB  -Closely as she is back on Lasix  CAD/ PAD (peripheral artery disease) (HCC): - No anginal complaints, EKG shows sinus tachycardia, cardiac biomarkers are negative. - Continue aspirin Plavix beta blockers and nitrates.  Insulin-dependent diabetes mellitus: Type II With a last A1c of 7.6. BG extremely uncontrolled, due to steroids, I have increased her Lantus to 12 units twice daily, I have increased her sliding scale coverage from mild to moderate .   Essential hypertension: Blood pressure is borderline low, will discontinue IV Lasix and Norvasc. Continue Toprol. Continue to hold losartan due to acute renal failure.   Hyperkalemia: Solved with a single dose of Kayexalate.    DVT prophylaxis: Lovenox Family Communication:none Disposition Plan/Barrier to D/C: transfer  to medical floor, will consult PT Code Status:     Code Status Orders  (From admission, onward)         Start     Ordered   02/13/18 0439  Full code  Continuous     02/13/18 0440        Code Status History    Date Active Date Inactive Code Status Order ID Comments User Context   10/17/2016 1327 10/28/2016 1949 Full Code [id number]  Runell Gess, MD Inpatient   10/31/2015 0151 11/14/2015 2143 Full Code [id number]  Jonah Blue, MD Inpatient   09/28/2015 1430 09/29/2015 1904 Full Code [id number]  Jolyn Nap, RN Inpatient   09/05/2015 1412 09/07/2015 1626 Full Code [id number]  Raymond Gurney, PA-C Inpatient   08/28/2015 1013 08/29/2015 2045 Full  Code [id number]  Runell Gess, MD Inpatient   03/02/2013 2153 03/04/2013 1702 Full Code [id number]  Penny Pia, MD ED        IV Access:    Peripheral IV   Procedures and diagnostic studies:   Dg Chest Port 1 View  Result Date: 02/17/2018 CLINICAL DATA:  Dyspnea. EXAM: PORTABLE  CHEST 1 VIEW COMPARISON:  Four days ago FINDINGS: Diffuse interstitial and airspace opacity with airway cuffing. Dense consolidation in the left lower lobe on 02/14/2018 chest CT is improved. Cardiomegaly. Possible small left pleural effusion. IMPRESSION: CHF pattern. Improved consolidation in the left lower lobe seen on chest CT 02/14/2018. Electronically Signed   By: Marnee Spring M.D.   On: 02/17/2018 07:39     Medical Consultants:    None.  Anti-Infectives:   None    Objective:    Vitals:   02/17/18 1040 02/17/18 1109 02/17/18 1133 02/17/18 1142  BP: (!) 111/55   (!) 119/56  Pulse: (!) 108 85  (!) 111  Resp: 17 (!) 23  (!) 29  Temp:   100 F (37.8 C)   TempSrc:   Rectal   SpO2: 95% 98%  99%  Weight:        Intake/Output Summary (Last 24 hours) at 02/17/2018 1230 Last data filed at 02/17/2018 1022 Gross per 24 hour  Intake 600 ml  Output 1100 ml  Net -500 ml   Filed Weights   02/14/18 0254 02/15/18 0400 02/16/18 0436  Weight: 83.5 kg 85.8 kg 89.1 kg    Exam:  Awake, tachypneic, in mild respiratory distress  Minister entry bilaterally, with diffuse wheezing with some use of accessory muscles  Regular rate and rhythm, no rubs murmurs gallops  Abdomen soft, nontender, nondistended, bowel sounds present  Remedy with +1 edema bilaterally, no clubbing or cyanosis     Data Reviewed:    Labs: Basic Metabolic Panel: Recent Labs  Lab 02/14/18 0436 02/14/18 0747 02/15/18 0538 02/15/18 2131 02/16/18 0222 02/17/18 0534  NA 135 136 136 127* 134* 135  K 4.8 4.9 3.8 3.7 4.1 4.2  CL 101 102 104 96* 100 102  CO2 22 23 22  20* 22 22  GLUCOSE 186* 174* 141* 479* 439* 264*  BUN 30* 29* 32* 42* 45* 59*  CREATININE 1.53* 1.44* 1.52* 1.97* 1.97* 1.74*  CALCIUM 8.2* 8.4* 7.9* 7.8* 8.2* 8.6*  MG 1.5*  --   --   --   --   --    GFR Estimated Creatinine Clearance: 33.9 mL/min (A) (by C-G formula based on SCr of 1.74 mg/dL (H)). Liver Function Tests: Recent Labs    Lab 02/13/18 0152 02/14/18 0747  AST 39 34  ALT 15 14  ALKPHOS 93 76  BILITOT 1.2 0.5  PROT 7.0 6.2*  ALBUMIN 3.1* 2.6*   No results for input(s): LIPASE, AMYLASE in the last 168 hours. No results for input(s): AMMONIA in the last 168 hours. Coagulation profile Recent Labs  Lab 02/14/18 0747  INR 1.25    CBC: Recent Labs  Lab 02/13/18 0152 02/14/18 0747 02/15/18 0538 02/16/18 0222  WBC 13.3* 8.2 7.4 5.3  NEUTROABS 10.5* 6.6  --   --   HGB 11.1* 9.3* 8.5* 8.2*  HCT 33.2* 28.0* 24.9* 24.6*  MCV 86.9 88.1 88.0 86.6  PLT 288 187 181 178   Cardiac Enzymes: Recent Labs  Lab 02/13/18 0152  TROPONINI <0.03   BNP (last 3 results) No results for input(s): PROBNP in the last 8760 hours. CBG:  Recent Labs  Lab 02/16/18 1209 02/16/18 1636 02/16/18 2109 02/17/18 0748 02/17/18 1130  GLUCAP 316* 348* 327* 225* 224*   D-Dimer: No results for input(s): DDIMER in the last 72 hours. Hgb A1c: No results for input(s): HGBA1C in the last 72 hours. Lipid Profile: No results for input(s): CHOL, HDL, LDLCALC, TRIG, CHOLHDL, LDLDIRECT in the last 72 hours. Thyroid function studies: No results for input(s): TSH, T4TOTAL, T3FREE, THYROIDAB in the last 72 hours.  Invalid input(s): FREET3 Anemia work up: No results for input(s): VITAMINB12, FOLATE, FERRITIN, TIBC, IRON, RETICCTPCT in the last 72 hours. Sepsis Labs: Recent Labs  Lab 02/13/18 0152 02/14/18 0747 02/14/18 0748 02/14/18 0958 02/15/18 0538 02/16/18 0222  PROCALCITON  --  0.16  --   --   --   --   WBC 13.3* 8.2  --   --  7.4 5.3  LATICACIDVEN  --   --  1.4 0.9  --   --    Microbiology Recent Results (from the past 240 hour(s))  Culture, blood (x 2)     Status: None (Preliminary result)   Collection Time: 02/14/18  7:40 AM  Result Value Ref Range Status   Specimen Description BLOOD RIGHT ANTECUBITAL  Final   Special Requests   Final    BOTTLES DRAWN AEROBIC ONLY Blood Culture results may not be optimal  due to an inadequate volume of blood received in culture bottles   Culture   Final    NO GROWTH 2 DAYS Performed at [facility], [address] [place], [place] [address]    Report Status PENDING  Incomplete  Culture, blood (x 2)     Status: None (Preliminary result)   Collection Time: 02/14/18  7:48 AM  Result Value Ref Range Status   Specimen Description BLOOD RIGHT ANTECUBITAL  Final   Special Requests   Final    BOTTLES DRAWN AEROBIC ONLY Blood Culture adequate volume   Culture   Final    NO GROWTH 2 DAYS Performed at [facility], [address] [place], [place] [address]    Report Status PENDING  Incomplete  Respiratory Panel by PCR     Status: Abnormal   Collection Time: 02/14/18 11:23 AM  Result Value Ref Range Status   Adenovirus NOT DETECTED NOT DETECTED Final   Coronavirus 229E NOT DETECTED NOT DETECTED Final   Coronavirus HKU1 NOT DETECTED NOT DETECTED Final   Coronavirus NL63 NOT DETECTED NOT DETECTED Final   Coronavirus OC43 NOT DETECTED NOT DETECTED Final   Metapneumovirus NOT DETECTED NOT DETECTED Final   Rhinovirus / Enterovirus NOT DETECTED NOT DETECTED Final   Influenza A NOT DETECTED NOT DETECTED Final   Influenza B NOT DETECTED NOT DETECTED Final   Parainfluenza Virus 1 NOT DETECTED NOT DETECTED Final   Parainfluenza Virus 2 NOT DETECTED NOT DETECTED Final   Parainfluenza Virus 3 NOT DETECTED NOT DETECTED Final   Parainfluenza Virus 4 NOT DETECTED NOT DETECTED Final   Respiratory Syncytial Virus DETECTED (A) NOT DETECTED Final    Comment: CRITICAL RESULT CALLED TO, READ BACK BY AND VERIFIED WITH: RN J PERRIN [phone] MLM    Bordetella pertussis NOT DETECTED NOT DETECTED Final   Chlamydophila pneumoniae NOT DETECTED NOT DETECTED Final   Mycoplasma pneumoniae NOT DETECTED NOT DETECTED Final    Comment: Performed at [facility], [address] [place], [place] [address]     Medications:   . albuterol  2.5 mg Nebulization Q4H   .  aspirin EC  81 mg Oral Daily  . atorvastatin  80 mg Oral q1800  . chlorhexidine  15 mL Mouth Rinse BID  . clopidogrel  75 mg Oral Daily  . [START ON 02/18/2018] furosemide  40 mg Intravenous Daily  . gabapentin  300 mg Oral TID  . guaiFENesin  1,200 mg Oral BID  . heparin  5,000 Units Subcutaneous Q8H  . insulin aspart  0-15 Units Subcutaneous TID WC  . insulin aspart  0-5 Units Subcutaneous QHS  . insulin aspart  4 Units Subcutaneous TID WC  . insulin glargine  12 Units Subcutaneous BID  . mouth rinse  15 mL Mouth Rinse q12n4p  . methylPREDNISolone (SOLU-MEDROL) injection  60 mg Intravenous Q8H  . metoprolol succinate  75 mg Oral Daily  . pantoprazole  40 mg Oral Daily  . sodium chloride flush  3 mL Intravenous Q12H   Continuous Infusions: . sodium chloride    . azithromycin    . ceFEPime (MAXIPIME) IV 2 g (02/17/18 0832)      LOS: 4 days   Huey Bienenstock MD [facility]   *Please refer to [email or web address], password [email or web address] to get updated schedule on who will round on this patient, as hospitalists switch teams weekly. If 7PM-7AM, please contact night-coverage at [email or web address], password [email or web address] for any overnight needs.  02/17/2018, 12:30 PM

## 2018-02-17 NOTE — Progress Notes (Signed)
Pt SOB, lethargic, O2 sat 88% on 2L, Pt also having expiratory wheezes, RT notified, pt placed on Bipap. Merdis Delay NP notified, Dr. Arsenio Loader PCCM notified, awaiting call back,  Rapid response nurse made aware.

## 2018-02-17 NOTE — Progress Notes (Signed)
Received patient, lethargic,RN and RT in the room RR Rn came to see patient,MD at bedside ordered to transfer to progressive unit. Report given to Rn. Daughter ,Leotis Shames was also informed that the pt was transfered to [phone].Marland Kitchen

## 2018-02-18 DIAGNOSIS — I2510 Atherosclerotic heart disease of native coronary artery without angina pectoris: Secondary | ICD-10-CM

## 2018-02-18 DIAGNOSIS — N179 Acute kidney failure, unspecified: Secondary | ICD-10-CM

## 2018-02-18 DIAGNOSIS — E1165 Type 2 diabetes mellitus with hyperglycemia: Secondary | ICD-10-CM

## 2018-02-18 DIAGNOSIS — I10 Essential (primary) hypertension: Secondary | ICD-10-CM

## 2018-02-18 DIAGNOSIS — I739 Peripheral vascular disease, unspecified: Secondary | ICD-10-CM

## 2018-02-18 DIAGNOSIS — I5023 Acute on chronic systolic (congestive) heart failure: Secondary | ICD-10-CM

## 2018-02-18 DIAGNOSIS — J9601 Acute respiratory failure with hypoxia: Secondary | ICD-10-CM

## 2018-02-18 LAB — BASIC METABOLIC PANEL
Anion gap: 11 (ref 5–15)
BUN: 67 mg/dL — AB (ref 8–23)
CO2: 21 mmol/L — ABNORMAL LOW (ref 22–32)
CREATININE: 1.67 mg/dL — AB (ref 0.44–1.00)
Calcium: 8.6 mg/dL — ABNORMAL LOW (ref 8.9–10.3)
Chloride: 105 mmol/L (ref 98–111)
GFR calc Af Amer: 36 mL/min — ABNORMAL LOW (ref >60)
GFR calc non Af Amer: 31 mL/min — ABNORMAL LOW (ref >60)
Glucose, Bld: 316 mg/dL — ABNORMAL HIGH (ref 70–99)
Potassium: 4.1 mmol/L (ref 3.5–5.1)
Sodium: 137 mmol/L (ref 135–145)

## 2018-02-18 LAB — HEMOGLOBIN A1C
Hgb A1c MFr Bld: 8.6 % — ABNORMAL HIGH (ref 4.8–5.6)
Mean Plasma Glucose: 200.12 mg/dL

## 2018-02-18 LAB — GLUCOSE, CAPILLARY
Glucose-Capillary: 296 mg/dL — ABNORMAL HIGH (ref 70–99)
Glucose-Capillary: 359 mg/dL — ABNORMAL HIGH (ref 70–99)
Glucose-Capillary: 314 mg/dL — ABNORMAL HIGH (ref 70–99)
Glucose-Capillary: 260 mg/dL — ABNORMAL HIGH (ref 70–99)

## 2018-02-18 LAB — CBC
HCT: 24.8 % — ABNORMAL LOW (ref 36.0–46.0)
Hemoglobin: 8.3 g/dL — ABNORMAL LOW (ref 12.0–15.0)
MCH: 28.6 pg (ref 26.0–34.0)
MCHC: 33.5 g/dL (ref 30.0–36.0)
MCV: 85.5 fL (ref 80.0–100.0)
Platelets: 193 10*3/uL (ref 150–400)
RBC: 2.9 MIL/uL — ABNORMAL LOW (ref 3.87–5.11)
RDW: 13.9 % (ref 11.5–15.5)
WBC: 7.7 10*3/uL (ref 4.0–10.5)
nRBC: 0 % (ref 0.0–0.2)

## 2018-02-18 MED ORDER — METHYLPREDNISOLONE SODIUM SUCC 125 MG IJ SOLR
60.0000 mg | Freq: Every day | INTRAMUSCULAR | Status: DC
  Administered 2018-02-19: 60 mg via INTRAVENOUS
  Filled 2018-02-18: qty 2

## 2018-02-18 MED ORDER — ALBUTEROL SULFATE (2.5 MG/3ML) 0.083% IN NEBU
2.5000 mg | INHALATION_SOLUTION | Freq: Four times a day (QID) | RESPIRATORY_TRACT | Status: DC
  Administered 2018-02-18 – 2018-02-19 (×5): 2.5 mg via RESPIRATORY_TRACT
  Filled 2018-02-18 (×5): qty 3

## 2018-02-18 NOTE — Progress Notes (Signed)
Inpatient Diabetes Program Recommendations  AACE/ADA: New Consensus Statement on Inpatient Glycemic Control (2015)  Target Ranges:  Prepandial:   less than 140 mg/dL      Peak postprandial:   less than 180 mg/dL (1-2 hours)      Critically ill patients:  140 - 180 mg/dL   Lab Results  Component Value Date   GLUCAP 296 (H) 02/18/2018   HGBA1C 7.6 (H) 11/02/2015    Review of Glycemic Control Results for [name] [name], [name] [name] (MRN [id number]) as of 02/18/2018 11:55  Ref. Range 02/17/2018 07:48 02/17/2018 11:30 02/17/2018 16:16 02/17/2018 20:46 02/18/2018 07:27  Glucose-Capillary Latest Ref Range: 70 - 99 mg/dL 121 (H) 975 (H) 883 (H) 305 (H) 296 (H)   Diabetes history:DM2 Outpatient Diabetes medications: Levemir 35 units qd Current orders for Inpatient glycemic control: Lantus 12 units bid + Novolog 4 units tid meal coverage + Novolog moderate correction tid + hs  Inpatient Diabetes Program Recommendations:   Noted steroids to decrease to daily tomorrow. -Increase Levemir to 15 units bid -Increase Novolog meal coverage to 6 tid if eats 50%  Thank you, Darel Hong E. Genieve Ramaswamy, RN, MSN, CDE  Diabetes Coordinator Inpatient Glycemic Control Team Team Pager [phone] (8am-5pm) 02/18/2018 11:58 AM

## 2018-02-18 NOTE — Progress Notes (Signed)
PROGRESS NOTE    [name] [name]  ZOX:[id number] DOB: 25-Oct-1950 DOA: 02/13/2018 PCP: Georgann Housekeeper, MD   Brief Narrative: [name] [name] is a 68 y.o. female with a history of CAD s/p stents, PAD s/p bilateral aorto-fermoral bypass, essential hypertension, chronic systolic heart failure, diabetes mellitus. Patient presented secondary to respiratory distress requiring Bipap and found to have bibasilar pneumonia. She also developed acute on chronic heart failure is RSV positive with subsequent COPD exacerbation.   Assessment & Plan:   Principal Problem:   Acute on chronic systolic CHF (congestive heart failure) (HCC) Active Problems:   PAD (peripheral artery disease) (HCC)   Coronary artery disease involving native coronary artery of native heart without angina pectoris   Diabetes mellitus type II, uncontrolled (HCC)   Hypertensive heart disease with chronic diastolic congestive heart failure (HCC)   Acute respiratory failure with hypoxia (HCC)   Uncontrolled type 2 diabetes mellitus with complication (HCC)   Benign hypertension   AKI (acute kidney injury) (HCC)   Acute systolic CHF (congestive heart failure) (HCC)   Acute on chronic systolic and diastolic heart failure Transthoracic Echocardiogram significant for an EF of 45%, diffuse hypokinesis, moderate (Grade 2) diastolic dysfunction. Currently being diuresed.  -Continue Lasix -Daily weights; urine output  Bibasilar pneumonia Patient empirically treated with Vancomycin and Cefepime with improvement. Azithromycin started secondary to continued fever. -Continue Cefepime and Azithromycin  COPD exacerbation Patient started on treatment secondary to wheezing. Currently appears to have significant upper airway wheezing.  -Decrease to Solumedrol daily -Continue albuterol  RSV infection -Supportive care  CAD/PAD Asymptomatic. -Continue Plavix, metoprolol  Diabetes mellitus, type 2 Worsened control in setting of steroids.    -Continue Lantus, Novolog, SSI  Acute respiratory failure with hypoxia Secondary to pneumonia/RSV infection. -Wean oxygen to room air  Essential hypertension Pretty well controlled -Continue metoprolol  Acute kidney injury on CKD stage III Baseline creatinine of 1.4-1.5. Creatinine peaked at 1.97 and is currently back down to baseline. Likely cardiorenal in setting of acute heart failure. -Continue Lasix -Renally dose medications as needed   DVT prophylaxis: Subcutaneous heparin Code Status:   Code Status: Full Code Family Communication: None at bedside Disposition Plan: Discharge pending resolution of acute heart failure and respiratory failure   Consultants:   None  Procedures:   Transthoracic Echocardiogram (02/13/2018) Study Conclusions  - Left ventricle: The cavity size was normal. Wall thickness was   normal. The estimated ejection fraction was 45%. Diffuse   hypokinesis. Features are consistent with a pseudonormal left   ventricular filling pattern, with concomitant abnormal relaxation   and increased filling pressure (grade 2 diastolic dysfunction). - Aortic valve: There was no stenosis. - Mitral valve: Mildly calcified annulus. There was no significant   regurgitation. - Left atrium: The atrium was mildly dilated. - Right ventricle: The cavity size was normal. Systolic function   was normal. - Pulmonary arteries: No complete TR doppler jet so unable to   estimate PA systolic pressure. - Systemic veins: IVC measured 2.5 cm with > 50% respirophasic   variation, suggesting RA pressure 8 mmHg. - Pericardium, extracardiac: A trivial pericardial effusion was   identified.  Impressions:  - Normal LV size with EF 45%, diffuse hypokinesis. Moderate   diastolic dysfunction. Normal RV size and systolic function. No   significant valvular abnormalities.  Antimicrobials:  Vancomycin (1/18;1/20)  Cefepime (1/18>>1/19; 1/21>>  Azithromycin (1/21>>     Subjective: Shortness of breath improved from yesterday.  Objective: Vitals:   02/18/18 8119 02/18/18 1478  02/18/18 0727 02/18/18 0815  BP:  (!) 150/89 (!) 142/74   Pulse:  89 95   Resp:  19 17   Temp:  97.6 F (36.4 C) 98.1 F (36.7 C)   TempSrc:  Oral Oral   SpO2: 99% 100% 98% 98%  Weight:  89.7 kg      Intake/Output Summary (Last 24 hours) at 02/18/2018 0840 Last data filed at 02/18/2018 0500 Gross per 24 hour  Intake 1216.47 ml  Output 1150 ml  Net 66.47 ml   Filed Weights   02/15/18 0400 02/16/18 0436 02/18/18 0616  Weight: 85.8 kg 89.1 kg 89.7 kg    Examination:  General exam: Appears calm and comfortable Respiratory system: Diffuse wheezing, transmitted from upper airway. Speaks on somewhat short sentences. Mild increase in respiratory effort. Cardiovascular system: S1 & S2 heard, RRR. No murmurs, rubs, gallops or clicks. Gastrointestinal system: Abdomen is nondistended, soft and nontender. No organomegaly or masses felt. Normal bowel sounds heard. Central nervous system: Alert and oriented. No focal neurological deficits. Extremities: No edema. No calf tenderness. Anterior leg tenderness Skin: No cyanosis. No rashes Psychiatry: Judgement and insight appear normal. Mood & affect appropriate.     Data Reviewed: I have personally reviewed following labs and imaging studies  CBC: Recent Labs  Lab 02/13/18 0152 02/14/18 0747 02/15/18 0538 02/16/18 0222  WBC 13.3* 8.2 7.4 5.3  NEUTROABS 10.5* 6.6  --   --   HGB 11.1* 9.3* 8.5* 8.2*  HCT 33.2* 28.0* 24.9* 24.6*  MCV 86.9 88.1 88.0 86.6  PLT 288 187 181 178   Basic Metabolic Panel: Recent Labs  Lab 02/14/18 0436  02/15/18 0538 02/15/18 2131 02/16/18 0222 02/17/18 0534 02/18/18 0453  NA 135   < > 136 127* 134* 135 137  K 4.8   < > 3.8 3.7 4.1 4.2 4.1  CL 101   < > 104 96* 100 102 105  CO2 22   < > 22 20* 22 22 21*  GLUCOSE 186*   < > 141* 479* 439* 264* 316*  BUN 30*   < > 32* 42* 45* 59* 67*   CREATININE 1.53*   < > 1.52* 1.97* 1.97* 1.74* 1.67*  CALCIUM 8.2*   < > 7.9* 7.8* 8.2* 8.6* 8.6*  MG 1.5*  --   --   --   --   --   --    < > = values in this interval not displayed.   GFR: Estimated Creatinine Clearance: 35.5 mL/min (A) (by C-G formula based on SCr of 1.67 mg/dL (H)). Liver Function Tests: Recent Labs  Lab 02/13/18 0152 02/14/18 0747  AST 39 34  ALT 15 14  ALKPHOS 93 76  BILITOT 1.2 0.5  PROT 7.0 6.2*  ALBUMIN 3.1* 2.6*   No results for input(s): LIPASE, AMYLASE in the last 168 hours. No results for input(s): AMMONIA in the last 168 hours. Coagulation Profile: Recent Labs  Lab 02/14/18 0747  INR 1.25   Cardiac Enzymes: Recent Labs  Lab 02/13/18 0152  TROPONINI <0.03   BNP (last 3 results) No results for input(s): PROBNP in the last 8760 hours. HbA1C: No results for input(s): HGBA1C in the last 72 hours. CBG: Recent Labs  Lab 02/17/18 0748 02/17/18 1130 02/17/18 1616 02/17/18 2046 02/18/18 0727  GLUCAP 225* 224* 249* 305* 296*   Lipid Profile: No results for input(s): CHOL, HDL, LDLCALC, TRIG, CHOLHDL, LDLDIRECT in the last 72 hours. Thyroid Function Tests: No results for input(s): TSH, T4TOTAL, FREET4,  T3FREE, THYROIDAB in the last 72 hours. Anemia Panel: No results for input(s): VITAMINB12, FOLATE, FERRITIN, TIBC, IRON, RETICCTPCT in the last 72 hours. Sepsis Labs: Recent Labs  Lab 02/14/18 0747 02/14/18 0748 02/14/18 0958  PROCALCITON 0.16  --   --   LATICACIDVEN  --  1.4 0.9    Recent Results (from the past 240 hour(s))  Culture, blood (x 2)     Status: None (Preliminary result)   Collection Time: 02/14/18  7:40 AM  Result Value Ref Range Status   Specimen Description BLOOD RIGHT ANTECUBITAL  Final   Special Requests   Final    BOTTLES DRAWN AEROBIC ONLY Blood Culture results may not be optimal due to an inadequate volume of blood received in culture bottles   Culture   Final    NO GROWTH 3 DAYS Performed at [facility], [address], [place], [place] [address]    Report Status PENDING  Incomplete  Culture, blood (x 2)     Status: None (Preliminary result)   Collection Time: 02/14/18  7:48 AM  Result Value Ref Range Status   Specimen Description BLOOD RIGHT ANTECUBITAL  Final   Special Requests   Final    BOTTLES DRAWN AEROBIC ONLY Blood Culture adequate volume   Culture   Final    NO GROWTH 3 DAYS Performed at [facility], [address], [place], [place] [address]    Report Status PENDING  Incomplete  Respiratory Panel by PCR     Status: Abnormal   Collection Time: 02/14/18 11:23 AM  Result Value Ref Range Status   Adenovirus NOT DETECTED NOT DETECTED Final   Coronavirus 229E NOT DETECTED NOT DETECTED Final   Coronavirus HKU1 NOT DETECTED NOT DETECTED Final   Coronavirus NL63 NOT DETECTED NOT DETECTED Final   Coronavirus OC43 NOT DETECTED NOT DETECTED Final   Metapneumovirus NOT DETECTED NOT DETECTED Final   Rhinovirus / Enterovirus NOT DETECTED NOT DETECTED Final   Influenza A NOT DETECTED NOT DETECTED Final   Influenza B NOT DETECTED NOT DETECTED Final   Parainfluenza Virus 1 NOT DETECTED NOT DETECTED Final   Parainfluenza Virus 2 NOT DETECTED NOT DETECTED Final   Parainfluenza Virus 3 NOT DETECTED NOT DETECTED Final   Parainfluenza Virus 4 NOT DETECTED NOT DETECTED Final   Respiratory Syncytial Virus DETECTED (A) NOT DETECTED Final    Comment: CRITICAL RESULT CALLED TO, READ BACK BY AND VERIFIED WITH: RN J PERRIN [phone]    Bordetella pertussis NOT DETECTED NOT DETECTED Final   Chlamydophila pneumoniae NOT DETECTED NOT DETECTED Final   Mycoplasma pneumoniae NOT DETECTED NOT DETECTED Final    Comment: Performed at [facility], [address], [place], [place] [address]         Radiology Studies: Dg Chest Port 1 View  Result Date: 02/17/2018 CLINICAL DATA:  Dyspnea. EXAM: PORTABLE CHEST 1 VIEW COMPARISON:  Four days ago FINDINGS: Diffuse interstitial  and airspace opacity with airway cuffing. Dense consolidation in the left lower lobe on 02/14/2018 chest CT is improved. Cardiomegaly. Possible small left pleural effusion. IMPRESSION: CHF pattern. Improved consolidation in the left lower lobe seen on chest CT 02/14/2018. Electronically Signed   By: Marnee Spring M.D.   On: 02/17/2018 07:39        Scheduled Meds: . albuterol  2.5 mg Nebulization Q4H  . aspirin EC  81 mg Oral Daily  . atorvastatin  80 mg Oral q1800  . chlorhexidine  15 mL Mouth Rinse BID  . clopidogrel  75 mg Oral Daily  . furosemide  40 mg Intravenous Daily  . gabapentin  300 mg Oral TID  . guaiFENesin  1,200 mg Oral BID  . heparin  5,000 Units Subcutaneous Q8H  . insulin aspart  0-15 Units Subcutaneous TID WC  . insulin aspart  0-5 Units Subcutaneous QHS  . insulin aspart  4 Units Subcutaneous TID WC  . insulin glargine  12 Units Subcutaneous BID  . mouth rinse  15 mL Mouth Rinse q12n4p  . [START ON 02/19/2018] methylPREDNISolone (SOLU-MEDROL) injection  60 mg Intravenous Daily  . metoprolol succinate  75 mg Oral Daily  . pantoprazole  40 mg Oral Daily  . sodium chloride flush  3 mL Intravenous Q12H   Continuous Infusions: . sodium chloride    . azithromycin Stopped (02/17/18 1457)  . ceFEPime (MAXIPIME) IV 2 g (02/18/18 0819)     LOS: 5 days     Jacquelin Hawkingalph Akhilesh Sassone, MD [facility] 02/18/2018, 8:40 AM  If 7PM-7AM, please contact night-coverage [email or web address]

## 2018-02-18 NOTE — Evaluation (Signed)
Physical Therapy Evaluation Patient Details Name: [name] [name] MRN: [id number] DOB: 06/24/1950 Today's Date: 02/18/2018   History of Present Illness  [name] [name] is an 68 y.o. female past medical history significant for CAD status post stent, PAD with bilateral aorto femoral bypass, essential hypertension, chronic systolic heart failure, insulin-dependent diabetes mellitus uncontrolled, also with a history of noncompliance presents to the emergency room in acute respiratory distress.  Requiring BiPAP initially and oxygen.  Continues to improve, transferred out of ICU 02/16/2018, running a.m., patient in significant respiratory distress, she was put back on BiPAP and transferred to stepdown floor.  Placed on [facility] 1/22.    Clinical Impression  Pt admitted with above diagnosis. Pt currently with functional limitations due to the deficits listed below (see PT Problem List). Pt was able to ambulate around bed with min assist and mod cues with RW with unsteady gait as well as poor postural stability.  Pt states daughter is with her 24 hours at home.  Will follow acutely.   Pt will benefit from skilled PT to increase their independence and safety with mobility to allow discharge to the venue listed below.      Follow Up Recommendations Home health PT;Supervision/Assistance - 78 hour(if daughter can't provide 24 hour care, SNF recommended)    Equipment Recommendations  None recommended by PT    Recommendations for Other Services       Precautions / Restrictions Precautions Precautions: Fall Restrictions Weight Bearing Restrictions: No      Mobility  Bed Mobility Overal bed mobility: Needs Assistance Bed Mobility: Supine to Sit     Supine to sit: Min assist     General bed mobility comments: needed assist to come to EOB, pt reached for PT hand to pull herself out to get feet on floor.   Transfers Overall transfer level: Needs assistance Equipment used: Rolling walker (2  wheeled) Transfers: Sit to/from Stand Sit to Stand: Min assist         General transfer comment: Needed min assist to steady in standing.  Does not stand all the way straight which pt states was premorbid.  Ambulation/Gait Ambulation/Gait assistance: Min assist Gait Distance (Feet): 20 Feet Assistive device: Rolling walker (2 wheeled) Gait Pattern/deviations: Step-through pattern;Decreased stride length;Shuffle;Drifts right/left;Trunk flexed;Wide base of support   Gait velocity interpretation: <1.31 ft/sec, indicative of household ambulator General Gait Details: Pt ambulated around bed using RW but needed guidance and cues for safety.  Had to assist with RW proximity as well as cue for posture and steady pt as pt unsteady on her feet.   Stairs            Wheelchair Mobility    Modified Rankin (Stroke Patients Only)       Balance Overall balance assessment: Needs assistance Sitting-balance support: No upper extremity supported;Feet supported Sitting balance-Leahy Scale: Fair     Standing balance support: Bilateral upper extremity supported;During functional activity Standing balance-Leahy Scale: Poor Standing balance comment: relies on UE supportfor balance                             Pertinent Vitals/Pain Pain Assessment: No/denies pain    Home Living Family/patient expects to be discharged to:: Private residence Living Arrangements: Children Available Help at Discharge: Family;Available 24 hours/day(daughter) Type of Home: House Home Access: Stairs to enter Entrance Stairs-Rails: Left Entrance Stairs-Number of Steps: 3 Home Layout: One level Home Equipment: Walker - 2 wheels;Walker - 4  wheels;Cane - single point;Bedside commode;Tub bench      Prior Function Level of Independence: Independent with assistive device(s)         Comments: Limited household ambulation with RW, independent in ADLs, assist with iADLs     Hand Dominance    Dominant Hand: Right    Extremity/Trunk Assessment   Upper Extremity Assessment Upper Extremity Assessment: Defer to OT evaluation    Lower Extremity Assessment Lower Extremity Assessment: Generalized weakness    Cervical / Trunk Assessment Cervical / Trunk Assessment: Normal  Communication   Communication: No difficulties  Cognition Arousal/Alertness: Awake/alert Behavior During Therapy: WFL for tasks assessed/performed Overall Cognitive Status: Within Functional Limits for tasks assessed                                        General Comments General comments (skin integrity, edema, etc.): HR 89-110 bpm, 98% O2 at rest on 4L.  Desat to 86% on RA at rest.  Ambulated pt with 4LO2 with sats >90%.      Exercises     Assessment/Plan    PT Assessment Patient needs continued PT services  PT Problem List Decreased activity tolerance;Decreased balance;Decreased mobility;Decreased knowledge of use of DME;Decreased safety awareness;Decreased knowledge of precautions;Decreased strength;Cardiopulmonary status limiting activity       PT Treatment Interventions DME instruction;Gait training;Functional mobility training;Therapeutic activities;Therapeutic exercise;Stair training;Balance training;Patient/family education    PT Goals (Current goals can be found in the Care Plan section)  Acute Rehab PT Goals Patient Stated Goal: to go home PT Goal Formulation: With patient Time For Goal Achievement: 03/04/18 Potential to Achieve Goals: Good    Frequency Min 3X/week   Barriers to discharge        Co-evaluation               AM-PAC PT "6 Clicks" Mobility  Outcome Measure Help needed turning from your back to your side while in a flat bed without using bedrails?: A Little Help needed moving from lying on your back to sitting on the side of a flat bed without using bedrails?: A Little Help needed moving to and from a bed to a chair (including a wheelchair)?: A  Lot Help needed standing up from a chair using your arms (e.g., wheelchair or bedside chair)?: A Lot Help needed to walk in hospital room?: A Lot Help needed climbing 3-5 steps with a railing? : A Lot 6 Click Score: 14    End of Session Equipment Utilized During Treatment: Gait belt;Oxygen Activity Tolerance: Patient limited by fatigue Patient left: in chair;with call bell/phone within reach;with chair alarm set Nurse Communication: Mobility status PT Visit Diagnosis: Muscle weakness (generalized) (M62.81);Unsteadiness on feet (R26.81)    Time: 8786-7672 PT Time Calculation (min) (ACUTE ONLY): 18 min   Charges:   PT Evaluation $PT Eval Moderate Complexity: 1 Mod          Ariyan Brisendine,PT Acute Rehabilitation Services Pager:  [phone]  Office:  [phone]    Berline Lopes 02/18/2018, 10:48 AM

## 2018-02-18 NOTE — Consult Note (Signed)
[facility] CM Primary Care Navigator  02/18/2018  [name] [name] May 18, 1950 [id number]   Met withpatient at the bedside to identify possible discharge needs.   Patientreports having "difficulty breathing" that had led to this admission. She presented with respiratory distress requiring Bipap and was found to have bibasilar pneumonia, also developed acute on chronic heart failure, is RSV- respiratory syncytial virus positive with subsequent COPD exacerbation.  Patient endorses Dr. Wenda Low with [facility] as herprimary care provider.   Patient shared using [facility] pharmacy on [address] obtain medications without difficulty.   Patient states that she manages her medications at home with some help from [name]) using "pill box" system filled every 2 weeks.   Patient reports that daughter providestransportation toherdoctors'appointments.  Patient's daughter lives with her and serves as the primary caregiver at home per patient.  Anticipated discharge planis home with health services vs. SNF- skilled nursing facility-if daughter can not provide 24 hour care per therapy recommendation. Patient is hoping to be discharge home.  Patientvoiced understanding to callprimary care provider's officewhen she returns back home,for a post discharge follow-upwithin a week or sooner if needed.Patient letter (with PCP's contact number) was provided as her reminder.  Explained to patient about [facility] CM services available for health management and resources at home and she seemed interested about it, but verbalized that would only opt for phone calls and "no to visits".  Patient had agreed for [facility] Pneumonia calls to follow-up with her recovery at home.  Referral made for [facility]MI Pneumonia calls after discharge.  Patient was encouraged and had expressed her understanding to seek referral to [facility] for further services (that she may  need in managing health conditions) from primary care provider if deemed necessary and appropriate for servicesin the near future- once she returns to home.   [facility] care management information provided for future needs that she may have.  Primary care provider's office is listed as doing transition of care (TOC).   For additional questions please contact:  Edwena Felty A. Zenon Leaf, BSN, RN-BC [facility] PRIMARY CARE Navigator Cell: [phone]

## 2018-02-19 ENCOUNTER — Inpatient Hospital Stay (HOSPITAL_COMMUNITY): Payer: Medicare Other

## 2018-02-19 LAB — BASIC METABOLIC PANEL
Anion gap: 8 (ref 5–15)
BUN: 73 mg/dL — ABNORMAL HIGH (ref 8–23)
CALCIUM: 8.6 mg/dL — AB (ref 8.9–10.3)
CO2: 22 mmol/L (ref 22–32)
CREATININE: 1.38 mg/dL — AB (ref 0.44–1.00)
Chloride: 109 mmol/L (ref 98–111)
GFR calc Af Amer: 46 mL/min — ABNORMAL LOW (ref >60)
GFR calc non Af Amer: 39 mL/min — ABNORMAL LOW (ref >60)
Glucose, Bld: 233 mg/dL — ABNORMAL HIGH (ref 70–99)
Potassium: 4 mmol/L (ref 3.5–5.1)
SODIUM: 139 mmol/L (ref 135–145)

## 2018-02-19 LAB — CULTURE, BLOOD (ROUTINE X 2)
Culture: NO GROWTH
Culture: NO GROWTH
Special Requests: ADEQUATE

## 2018-02-19 LAB — GLUCOSE, CAPILLARY
GLUCOSE-CAPILLARY: 160 mg/dL — AB (ref 70–99)
Glucose-Capillary: 205 mg/dL — ABNORMAL HIGH (ref 70–99)
Glucose-Capillary: 216 mg/dL — ABNORMAL HIGH (ref 70–99)
Glucose-Capillary: 219 mg/dL — ABNORMAL HIGH (ref 70–99)

## 2018-02-19 MED ORDER — INSULIN GLARGINE 100 UNIT/ML ~~LOC~~ SOLN
16.0000 [IU] | Freq: Two times a day (BID) | SUBCUTANEOUS | Status: DC
  Administered 2018-02-19 – 2018-02-21 (×5): 16 [IU] via SUBCUTANEOUS
  Filled 2018-02-19 (×6): qty 0.16

## 2018-02-19 MED ORDER — ALBUTEROL SULFATE (2.5 MG/3ML) 0.083% IN NEBU
2.5000 mg | INHALATION_SOLUTION | Freq: Two times a day (BID) | RESPIRATORY_TRACT | Status: DC
  Administered 2018-02-20 – 2018-02-21 (×3): 2.5 mg via RESPIRATORY_TRACT
  Filled 2018-02-19 (×3): qty 3

## 2018-02-19 NOTE — Progress Notes (Signed)
Pharmacy Antibiotic Note  [name] [name] is a 68 y.o. female admitted on 02/13/2018 with respiratory distress.  Pharmacy has been consulted cefepime dosing for PNA (day 6). Azithromycin (day 3) was added on 1/21 due to fever.  -WBC= 7.7, afeb, SCr= 1.67, CrCL ~ 35  Plan: -Continue cefepime 2g q24 hours -Consider continuing antibiotics until 1/25 (this would be 5 days of azithromycin and 8 days of cefepime -Will follow renal function, cultures and clinical progress   Weight: 199 lb 8.3 oz (90.5 kg)  Temp (24hrs), Avg:98.3 F (36.8 C), Min:97.5 F (36.4 C), Max:99 F (37.2 C)  Recent Labs  Lab 02/13/18 0152  02/14/18 0747 02/14/18 0748 02/14/18 0958 02/15/18 0538 02/15/18 2131 02/16/18 0222 02/17/18 0534 02/18/18 0453 02/18/18 1115  WBC 13.3*  --  8.2  --   --  7.4  --  5.3  --   --  7.7  CREATININE 1.54*   < > 1.44*  --   --  1.52* 1.97* 1.97* 1.74* 1.67*  --   LATICACIDVEN  --   --   --  1.4 0.9  --   --   --   --   --   --    < > = values in this interval not displayed.    Estimated Creatinine Clearance: 35.6 mL/min (A) (by C-G formula based on SCr of 1.67 mg/dL (H)).    Allergies  Allergen Reactions  . Doxycycline Other (See Comments)    lethargy  . Hydrochlorothiazide Other (See Comments)    lethargic   . Latex Rash  . Penicillins Swelling and Rash    Pt states she has tolerated Keflex in the past without problems. States she may have tolerated Augmentin in the past but it caused GI upset. Has patient had a PCN reaction causing immediate rash, facial/tongue/throat swelling, SOB or lightheadedness with hypotension: Yes Has patient had a PCN reaction causing severe rash involving mucus membranes or skin necrosis: No Has patient had a PCN reaction that required hospitalization No Has patient had a PCN reaction occurring within the last 10 years: No    Thank you for allowing pharmacy to be a part of this patient's care.  Harland German, PharmD Clinical  Pharmacist **Pharmacist phone directory can now be found on [email or web address] ([email or web address]).  Listed under [facility].

## 2018-02-19 NOTE — Progress Notes (Signed)
PROGRESS NOTE    [name] [name]  ZOX:[id number] DOB: 1950-12-24 DOA: 02/13/2018 PCP: Georgann Housekeeper, MD   Brief Narrative: [name] [name] is a 68 y.o. female with a history of CAD s/p stents, PAD s/p bilateral aorto-fermoral bypass, essential hypertension, chronic systolic heart failure, diabetes mellitus. Patient presented secondary to respiratory distress requiring Bipap and found to have bibasilar pneumonia. She also developed acute on chronic heart failure is RSV positive with subsequent COPD exacerbation.   Assessment & Plan:   Principal Problem:   Acute on chronic systolic CHF (congestive heart failure) (HCC) Active Problems:   PAD (peripheral artery disease) (HCC)   Coronary artery disease involving native coronary artery of native heart without angina pectoris   Diabetes mellitus type II, uncontrolled (HCC)   Hypertensive heart disease with chronic diastolic congestive heart failure (HCC)   Acute respiratory failure with hypoxia (HCC)   Uncontrolled type 2 diabetes mellitus with complication (HCC)   Benign hypertension   AKI (acute kidney injury) (HCC)   Acute systolic CHF (congestive heart failure) (HCC)   Acute on chronic systolic and diastolic heart failure Transthoracic Echocardiogram significant for an EF of 45%, diffuse hypokinesis, moderate (Grade 2) diastolic dysfunction. Currently being diuresed. Weight appears to be trending upwards even with diuresis. Urine output not documented well. -Continue Lasix, may need to increase dose -Daily weights; urine output -Chest x-ray -BMP pending  Bibasilar pneumonia Patient empirically treated with Vancomycin and Cefepime with improvement. Azithromycin started secondary to continued fever. -Continue Cefepime and Azithromycin  COPD exacerbation Patient started on treatment secondary to wheezing. Currently appears to have significant upper airway wheezing.  -Continue Solumedrol -Continue albuterol  RSV infection -Supportive  care  CAD/PAD Asymptomatic. -Continue Plavix, metoprolol  Diabetes mellitus, type 2 Worsened control in setting of steroids.  -Increase to Lantus 16 units BID -Continue Novolog, SSI  Acute respiratory failure with hypoxia Secondary to pneumonia/RSV infection. -Wean oxygen to room air as able  Essential hypertension Pretty well controlled -Continue metoprolol  Acute kidney injury on CKD stage III Baseline creatinine of 1.4-1.5. Creatinine peaked at 1.97 and is currently back down to baseline. Likely cardiorenal in setting of acute heart failure. -Continue Lasix -Renally dose medications as needed   DVT prophylaxis: Subcutaneous heparin Code Status:   Code Status: Full Code Family Communication: None at bedside Disposition Plan: Discharge pending resolution of acute heart failure and respiratory failure   Consultants:   None  Procedures:   Transthoracic Echocardiogram (02/13/2018) Study Conclusions  - Left ventricle: The cavity size was normal. Wall thickness was   normal. The estimated ejection fraction was 45%. Diffuse   hypokinesis. Features are consistent with a pseudonormal left   ventricular filling pattern, with concomitant abnormal relaxation   and increased filling pressure (grade 2 diastolic dysfunction). - Aortic valve: There was no stenosis. - Mitral valve: Mildly calcified annulus. There was no significant   regurgitation. - Left atrium: The atrium was mildly dilated. - Right ventricle: The cavity size was normal. Systolic function   was normal. - Pulmonary arteries: No complete TR doppler jet so unable to   estimate PA systolic pressure. - Systemic veins: IVC measured 2.5 cm with > 50% respirophasic   variation, suggesting RA pressure 8 mmHg. - Pericardium, extracardiac: A trivial pericardial effusion was   identified.  Impressions:  - Normal LV size with EF 45%, diffuse hypokinesis. Moderate   diastolic dysfunction. Normal RV size and  systolic function. No   significant valvular abnormalities.  Antimicrobials:  Vancomycin (1/18;1/20)  Cefepime (1/18>>1/19; 1/21>>  Azithromycin (1/21>>    Subjective: Some worsening shortness of breath this morning.  Objective: Vitals:   02/19/18 0415 02/19/18 0522 02/19/18 0800 02/19/18 0921  BP: (!) 114/54   118/74  Pulse: 68   95  Resp: 15     Temp: 99 F (37.2 C)     TempSrc: Axillary     SpO2: 100% 99% 96%   Weight: 90.5 kg       Intake/Output Summary (Last 24 hours) at 02/19/2018 0956 Last data filed at 02/19/2018 0600 Gross per 24 hour  Intake 1280 ml  Output 750 ml  Net 530 ml   Filed Weights   02/16/18 0436 02/18/18 0616 02/19/18 0415  Weight: 89.1 kg 89.7 kg 90.5 kg    Examination:  General exam: Appears calm and comfortable Respiratory system: Diffuse wheezes. Respiratory effort normal. Cardiovascular system: S1 & S2 heard, RRR. No murmurs, rubs, gallops or clicks. Gastrointestinal system: Abdomen is nondistended, soft and nontender. No organomegaly or masses felt. Normal bowel sounds heard. Central nervous system: Alert and oriented. No focal neurological deficits. Extremities: No edema. No calf tenderness Skin: No cyanosis. No rashes Psychiatry: Judgement and insight appear normal. Mood & affect appropriate.     Data Reviewed: I have personally reviewed following labs and imaging studies  CBC: Recent Labs  Lab 02/13/18 0152 02/14/18 0747 02/15/18 0538 02/16/18 0222 02/18/18 1115  WBC 13.3* 8.2 7.4 5.3 7.7  NEUTROABS 10.5* 6.6  --   --   --   HGB 11.1* 9.3* 8.5* 8.2* 8.3*  HCT 33.2* 28.0* 24.9* 24.6* 24.8*  MCV 86.9 88.1 88.0 86.6 85.5  PLT 288 187 181 178 193   Basic Metabolic Panel: Recent Labs  Lab 02/14/18 0436  02/15/18 0538 02/15/18 2131 02/16/18 0222 02/17/18 0534 02/18/18 0453  NA 135   < > 136 127* 134* 135 137  K 4.8   < > 3.8 3.7 4.1 4.2 4.1  CL 101   < > 104 96* 100 102 105  CO2 22   < > 22 20* 22 22 21*    GLUCOSE 186*   < > 141* 479* 439* 264* 316*  BUN 30*   < > 32* 42* 45* 59* 67*  CREATININE 1.53*   < > 1.52* 1.97* 1.97* 1.74* 1.67*  CALCIUM 8.2*   < > 7.9* 7.8* 8.2* 8.6* 8.6*  MG 1.5*  --   --   --   --   --   --    < > = values in this interval not displayed.   GFR: Estimated Creatinine Clearance: 35.6 mL/min (A) (by C-G formula based on SCr of 1.67 mg/dL (H)). Liver Function Tests: Recent Labs  Lab 02/13/18 0152 02/14/18 0747  AST 39 34  ALT 15 14  ALKPHOS 93 76  BILITOT 1.2 0.5  PROT 7.0 6.2*  ALBUMIN 3.1* 2.6*   No results for input(s): LIPASE, AMYLASE in the last 168 hours. No results for input(s): AMMONIA in the last 168 hours. Coagulation Profile: Recent Labs  Lab 02/14/18 0747  INR 1.25   Cardiac Enzymes: Recent Labs  Lab 02/13/18 0152  TROPONINI <0.03   BNP (last 3 results) No results for input(s): PROBNP in the last 8760 hours. HbA1C: Recent Labs    02/18/18 1115  HGBA1C 8.6*   CBG: Recent Labs  Lab 02/18/18 0727 02/18/18 1219 02/18/18 1706 02/18/18 2108 02/19/18 0804  GLUCAP 296* 359* 314* 260* 160*   Lipid Profile: No results for input(s):  CHOL, HDL, LDLCALC, TRIG, CHOLHDL, LDLDIRECT in the last 72 hours. Thyroid Function Tests: No results for input(s): TSH, T4TOTAL, FREET4, T3FREE, THYROIDAB in the last 72 hours. Anemia Panel: No results for input(s): VITAMINB12, FOLATE, FERRITIN, TIBC, IRON, RETICCTPCT in the last 72 hours. Sepsis Labs: Recent Labs  Lab 02/14/18 0747 02/14/18 0748 02/14/18 0958  PROCALCITON 0.16  --   --   LATICACIDVEN  --  1.4 0.9    Recent Results (from the past 240 hour(s))  Culture, blood (x 2)     Status: None (Preliminary result)   Collection Time: 02/14/18  7:40 AM  Result Value Ref Range Status   Specimen Description BLOOD RIGHT ANTECUBITAL  Final   Special Requests   Final    BOTTLES DRAWN AEROBIC ONLY Blood Culture results may not be optimal due to an inadequate volume of blood received in culture  bottles   Culture   Final    NO GROWTH 4 DAYS Performed at [facility], [address] [place], [place] [address]    Report Status PENDING  Incomplete  Culture, blood (x 2)     Status: None (Preliminary result)   Collection Time: 02/14/18  7:48 AM  Result Value Ref Range Status   Specimen Description BLOOD RIGHT ANTECUBITAL  Final   Special Requests   Final    BOTTLES DRAWN AEROBIC ONLY Blood Culture adequate volume   Culture   Final    NO GROWTH 4 DAYS Performed at [facility], [address] [place], [place] [address]    Report Status PENDING  Incomplete  Respiratory Panel by PCR     Status: Abnormal   Collection Time: 02/14/18 11:23 AM  Result Value Ref Range Status   Adenovirus NOT DETECTED NOT DETECTED Final   Coronavirus 229E NOT DETECTED NOT DETECTED Final   Coronavirus HKU1 NOT DETECTED NOT DETECTED Final   Coronavirus NL63 NOT DETECTED NOT DETECTED Final   Coronavirus OC43 NOT DETECTED NOT DETECTED Final   Metapneumovirus NOT DETECTED NOT DETECTED Final   Rhinovirus / Enterovirus NOT DETECTED NOT DETECTED Final   Influenza A NOT DETECTED NOT DETECTED Final   Influenza B NOT DETECTED NOT DETECTED Final   Parainfluenza Virus 1 NOT DETECTED NOT DETECTED Final   Parainfluenza Virus 2 NOT DETECTED NOT DETECTED Final   Parainfluenza Virus 3 NOT DETECTED NOT DETECTED Final   Parainfluenza Virus 4 NOT DETECTED NOT DETECTED Final   Respiratory Syncytial Virus DETECTED (A) NOT DETECTED Final    Comment: CRITICAL RESULT CALLED TO, READ BACK BY AND VERIFIED WITH: RN J PERRIN [id number] MLM    Bordetella pertussis NOT DETECTED NOT DETECTED Final   Chlamydophila pneumoniae NOT DETECTED NOT DETECTED Final   Mycoplasma pneumoniae NOT DETECTED NOT DETECTED Final    Comment: Performed at [facility], [address] [place], [place] [address]         Radiology Studies: No results found.      Scheduled Meds: . albuterol  2.5 mg Nebulization Q6H  .  aspirin EC  81 mg Oral Daily  . atorvastatin  80 mg Oral q1800  . chlorhexidine  15 mL Mouth Rinse BID  . clopidogrel  75 mg Oral Daily  . furosemide  40 mg Intravenous Daily  . gabapentin  300 mg Oral TID  . guaiFENesin  1,200 mg Oral BID  . heparin  5,000 Units Subcutaneous Q8H  . insulin aspart  0-15 Units Subcutaneous TID WC  . insulin  aspart  0-5 Units Subcutaneous QHS  . insulin aspart  4 Units Subcutaneous TID WC  . insulin glargine  12 Units Subcutaneous BID  . mouth rinse  15 mL Mouth Rinse q12n4p  . methylPREDNISolone (SOLU-MEDROL) injection  60 mg Intravenous Daily  . metoprolol succinate  75 mg Oral Daily  . pantoprazole  40 mg Oral Daily  . sodium chloride flush  3 mL Intravenous Q12H   Continuous Infusions: . sodium chloride    . azithromycin 500 mg (02/18/18 1233)  . ceFEPime (MAXIPIME) IV 2 g (02/19/18 0859)     LOS: 6 days     Jacquelin Hawking, MD [facility] 02/19/2018, 9:56 AM  If 7PM-7AM, please contact night-coverage [email or web address]

## 2018-02-20 ENCOUNTER — Encounter: Payer: Self-pay | Admitting: Podiatry

## 2018-02-20 LAB — CBC
HCT: 24.8 % — ABNORMAL LOW (ref 36.0–46.0)
Hemoglobin: 8.4 g/dL — ABNORMAL LOW (ref 12.0–15.0)
MCH: 29.7 pg (ref 26.0–34.0)
MCHC: 33.9 g/dL (ref 30.0–36.0)
MCV: 87.6 fL (ref 80.0–100.0)
Platelets: 268 10*3/uL (ref 150–400)
RBC: 2.83 MIL/uL — AB (ref 3.87–5.11)
RDW: 14.3 % (ref 11.5–15.5)
WBC: 15.4 10*3/uL — ABNORMAL HIGH (ref 4.0–10.5)
nRBC: 0.3 % — ABNORMAL HIGH (ref 0.0–0.2)

## 2018-02-20 LAB — GLUCOSE, CAPILLARY
GLUCOSE-CAPILLARY: 89 mg/dL (ref 70–99)
Glucose-Capillary: 119 mg/dL — ABNORMAL HIGH (ref 70–99)
Glucose-Capillary: 128 mg/dL — ABNORMAL HIGH (ref 70–99)
Glucose-Capillary: 287 mg/dL — ABNORMAL HIGH (ref 70–99)

## 2018-02-20 LAB — BASIC METABOLIC PANEL
Anion gap: 7 (ref 5–15)
BUN: 70 mg/dL — ABNORMAL HIGH (ref 8–23)
CO2: 24 mmol/L (ref 22–32)
Calcium: 8.7 mg/dL — ABNORMAL LOW (ref 8.9–10.3)
Chloride: 111 mmol/L (ref 98–111)
Creatinine, Ser: 1.36 mg/dL — ABNORMAL HIGH (ref 0.44–1.00)
GFR calc Af Amer: 47 mL/min — ABNORMAL LOW (ref >60)
GFR, EST NON AFRICAN AMERICAN: 40 mL/min — AB (ref >60)
Glucose, Bld: 119 mg/dL — ABNORMAL HIGH (ref 70–99)
POTASSIUM: 4.2 mmol/L (ref 3.5–5.1)
Sodium: 142 mmol/L (ref 135–145)

## 2018-02-20 MED ORDER — FUROSEMIDE 10 MG/ML IJ SOLN
40.0000 mg | Freq: Two times a day (BID) | INTRAMUSCULAR | Status: DC
  Administered 2018-02-20 – 2018-02-21 (×3): 40 mg via INTRAVENOUS
  Filled 2018-02-20 (×3): qty 4

## 2018-02-20 MED ORDER — PREDNISONE 20 MG PO TABS
40.0000 mg | ORAL_TABLET | Freq: Every day | ORAL | Status: DC
  Administered 2018-02-20 – 2018-02-21 (×2): 40 mg via ORAL
  Filled 2018-02-20 (×2): qty 2

## 2018-02-20 NOTE — Progress Notes (Signed)
Physical Therapy Treatment Patient Details Name: [name] [name] MRN: [id number] DOB: 05-31-1950 Today's Date: 02/20/2018    History of Present Illness [name] [name] is an 68 y.o. female past medical history significant for CAD status post stent, PAD with bilateral aorto femoral bypass, essential hypertension, chronic systolic heart failure, insulin-dependent diabetes mellitus uncontrolled, also with a history of noncompliance presents to the emergency room in acute respiratory distress.  Requiring BiPAP initially and oxygen.  Continues to improve, transferred out of ICU 02/16/2018, running a.m., patient in significant respiratory distress, she was put back on BiPAP and transferred to stepdown floor.  Placed on [facility] 1/22.      PT Comments    Pt admitted with above diagnosis. Pt currently with functional limitations due to balance and endurance deficits. Pt was able to ambulate and incr distance but still needed min assist for safety and to steady pt.  Pt states daughter will help her at home.  Pt will benefit from skilled PT to increase their independence and safety with mobility to allow discharge to the venue listed below.     Follow Up Recommendations  Home health PT;Supervision/Assistance - 50 hour(if daughter can't provide 24 hour care, SNF recommended)     Equipment Recommendations  None recommended by PT    Recommendations for Other Services       Precautions / Restrictions Precautions Precautions: Fall Restrictions Weight Bearing Restrictions: No    Mobility  Bed Mobility Overal bed mobility: Needs Assistance Bed Mobility: Supine to Sit     Supine to sit: Min assist     General bed mobility comments: needed assist to come to EOB, pt reached for PT hand to pull herself out to get feet on floor.   Transfers Overall transfer level: Needs assistance Equipment used: Rolling walker (2 wheeled) Transfers: Sit to/from Stand Sit to Stand: Min assist         General transfer  comment: Needed min assist to steady in standing.  Does not stand all the way straight which pt states was premorbid.  Ambulation/Gait Ambulation/Gait assistance: Min assist Gait Distance (Feet): 40 Feet Assistive device: Rolling walker (2 wheeled) Gait Pattern/deviations: Step-through pattern;Decreased stride length;Shuffle;Drifts right/left;Trunk flexed;Wide base of support   Gait velocity interpretation: <1.31 ft/sec, indicative of household ambulator General Gait Details: Pt ambulated using RW but needed guidance and cues for safety.  Had to assist with RW proximity as well as cue for posture and steady pt as pt unsteady on her feet.    Stairs             Wheelchair Mobility    Modified Rankin (Stroke Patients Only)       Balance Overall balance assessment: Needs assistance Sitting-balance support: No upper extremity supported;Feet supported Sitting balance-Leahy Scale: Fair     Standing balance support: Bilateral upper extremity supported;During functional activity Standing balance-Leahy Scale: Poor Standing balance comment: relies on UE supportfor balance                            Cognition Arousal/Alertness: Awake/alert Behavior During Therapy: WFL for tasks assessed/performed Overall Cognitive Status: Within Functional Limits for tasks assessed                                        Exercises General Exercises - Lower Extremity Ankle Circles/Pumps: AROM;Both;10 reps;Seated Long Arc Quad: AROM;Both;10  reps;Seated    General Comments General comments (skin integrity, edema, etc.): Pt VSS with sats >88% with activity.       Pertinent Vitals/Pain Pain Assessment: No/denies pain    Home Living                      Prior Function            PT Goals (current goals can now be found in the care plan section) Acute Rehab PT Goals Patient Stated Goal: to go home Progress towards PT goals: Progressing toward goals     Frequency    Min 3X/week      PT Plan Current plan remains appropriate    Co-evaluation              AM-PAC PT "6 Clicks" Mobility   Outcome Measure  Help needed turning from your back to your side while in a flat bed without using bedrails?: A Little Help needed moving from lying on your back to sitting on the side of a flat bed without using bedrails?: A Lot Help needed moving to and from a bed to a chair (including a wheelchair)?: A Lot Help needed standing up from a chair using your arms (e.g., wheelchair or bedside chair)?: A Lot Help needed to walk in hospital room?: A Lot Help needed climbing 3-5 steps with a railing? : A Lot 6 Click Score: 13    End of Session Equipment Utilized During Treatment: Gait belt Activity Tolerance: Patient limited by fatigue Patient left: in chair;with call bell/phone within reach;with chair alarm set Nurse Communication: Mobility status PT Visit Diagnosis: Muscle weakness (generalized) (M62.81);Unsteadiness on feet (R26.81)     Time: 1308-6578 PT Time Calculation (min) (ACUTE ONLY): 11 min  Charges:  $Gait Training: 8-22 mins                     Johnwilliam Shepperson,PT Acute Rehabilitation Services Pager:  [phone]  Office:  [phone]     Berline Lopes 02/20/2018, 1:05 PM

## 2018-02-20 NOTE — Progress Notes (Signed)
PROGRESS NOTE    [name] Ageonie N Basey  ZOX:[id number] DOB: 07-30-1950 DOA: 02/13/2018 PCP: Georgann HousekeeperHusain, Karrar, MD   Brief Narrative: [name] [name] is a 68 y.o. female with a history of CAD s/p stents, PAD s/p bilateral aorto-fermoral bypass, essential hypertension, chronic systolic heart failure, diabetes mellitus. Patient presented secondary to respiratory distress requiring Bipap and found to have bibasilar pneumonia. She also developed acute on chronic heart failure is RSV positive with subsequent COPD exacerbation.   Assessment & Plan:   Principal Problem:   Acute on chronic systolic CHF (congestive heart failure) (HCC) Active Problems:   PAD (peripheral artery disease) (HCC)   Coronary artery disease involving native coronary artery of native heart without angina pectoris   Diabetes mellitus type II, uncontrolled (HCC)   Hypertensive heart disease with chronic diastolic congestive heart failure (HCC)   Acute respiratory failure with hypoxia (HCC)   Uncontrolled type 2 diabetes mellitus with complication (HCC)   Benign hypertension   AKI (acute kidney injury) (HCC)   Acute systolic CHF (congestive heart failure) (HCC)   Acute on chronic systolic and diastolic heart failure Transthoracic Echocardiogram significant for an EF of 45%, diffuse hypokinesis, moderate (Grade 2) diastolic dysfunction. Currently being diuresed. Weight stable. UOP of 1.95 L.  -Increase to Lasix 40 mg BID -Daily weights; urine output -BMP daily  Bibasilar pneumonia Patient empirically treated with Vancomycin and Cefepime with improvement. Azithromycin started secondary to continued fever. -Continue Cefepime and Azithromycin  COPD exacerbation Patient started on treatment secondary to wheezing. Currently appears to have significant upper airway wheezing.  -Switch to prednisone daily with taper -Continue albuterol  RSV infection -Supportive care  CAD/PAD Asymptomatic. -Continue Plavix, metoprolol  Diabetes  mellitus, type 2 Worsened control in setting of steroids.  -Continue Lantus 16 units BID -Continue Novolog, SSI  Acute respiratory failure with hypoxia Secondary to pneumonia/RSV infection. -Wean oxygen to room air as able -Ambulatory SpO2  Essential hypertension Pretty well controlled -Continue metoprolol  Acute kidney injury on CKD stage III Baseline creatinine of 1.4-1.5. Creatinine peaked at 1.97 and is currently back down to baseline. Likely cardiorenal in setting of acute heart failure. -Continue Lasix -Renally dose medications as needed   DVT prophylaxis: Subcutaneous heparin Code Status:   Code Status: Full Code Family Communication: None at bedside Disposition Plan: Discharge pending resolution of acute heart failure and respiratory failure   Consultants:   None  Procedures:   Transthoracic Echocardiogram (02/13/2018) Study Conclusions  - Left ventricle: The cavity size was normal. Wall thickness was   normal. The estimated ejection fraction was 45%. Diffuse   hypokinesis. Features are consistent with a pseudonormal left   ventricular filling pattern, with concomitant abnormal relaxation   and increased filling pressure (grade 2 diastolic dysfunction). - Aortic valve: There was no stenosis. - Mitral valve: Mildly calcified annulus. There was no significant   regurgitation. - Left atrium: The atrium was mildly dilated. - Right ventricle: The cavity size was normal. Systolic function   was normal. - Pulmonary arteries: No complete TR doppler jet so unable to   estimate PA systolic pressure. - Systemic veins: IVC measured 2.5 cm with > 50% respirophasic   variation, suggesting RA pressure 8 mmHg. - Pericardium, extracardiac: A trivial pericardial effusion was   identified.  Impressions:  - Normal LV size with EF 45%, diffuse hypokinesis. Moderate   diastolic dysfunction. Normal RV size and systolic function. No   significant valvular  abnormalities.  Antimicrobials:  Vancomycin (1/18;1/20)  Cefepime (1/18>>1/19; 1/21>>  Azithromycin (1/21>>    Subjective: Breathing better than yesterday.  Objective: Vitals:   02/20/18 0444 02/20/18 0734 02/20/18 0738 02/20/18 0836  BP: 140/68  (!) 146/64   Pulse: 74 81 80   Resp:      Temp: 98.2 F (36.8 C)  97.6 F (36.4 C)   TempSrc: Oral  Oral   SpO2: 98% 95% 93% 96%  Weight: 90.2 kg       Intake/Output Summary (Last 24 hours) at 02/20/2018 0929 Last data filed at 02/20/2018 0700 Gross per 24 hour  Intake 1778.37 ml  Output 1950 ml  Net -171.63 ml   Filed Weights   02/18/18 0616 02/19/18 0415 02/20/18 0444  Weight: 89.7 kg 90.5 kg 90.2 kg    Examination:  General exam: Appears calm and comfortable Respiratory system: Mild wheezes bilaterally. Respiratory effort normal. Cardiovascular system: S1 & S2 heard, RRR. No murmurs, rubs, gallops or clicks. Gastrointestinal system: Abdomen is nondistended, soft and nontender. No organomegaly or masses felt. Normal bowel sounds heard. Central nervous system: Alert and oriented. No focal neurological deficits. Extremities: LE edema. No calf tenderness Skin: No cyanosis. No rashes Psychiatry: Judgement and insight appear normal. Mood & affect appropriate.   Data Reviewed: I have personally reviewed following labs and imaging studies  CBC: Recent Labs  Lab 02/14/18 0747 02/15/18 0538 02/16/18 0222 02/18/18 1115  WBC 8.2 7.4 5.3 7.7  NEUTROABS 6.6  --   --   --   HGB 9.3* 8.5* 8.2* 8.3*  HCT 28.0* 24.9* 24.6* 24.8*  MCV 88.1 88.0 86.6 85.5  PLT 187 181 178 193   Basic Metabolic Panel: Recent Labs  Lab 02/14/18 0436  02/16/18 0222 02/17/18 0534 02/18/18 0453 02/19/18 0956 02/20/18 0510  NA 135   < > 134* 135 137 139 142  K 4.8   < > 4.1 4.2 4.1 4.0 4.2  CL 101   < > 100 102 105 109 111  CO2 22   < > 22 22 21* 22 24  GLUCOSE 186*   < > 439* 264* 316* 233* 119*  BUN 30*   < > 45* 59* 67* 73* 70*   CREATININE 1.53*   < > 1.97* 1.74* 1.67* 1.38* 1.36*  CALCIUM 8.2*   < > 8.2* 8.6* 8.6* 8.6* 8.7*  MG 1.5*  --   --   --   --   --   --    < > = values in this interval not displayed.   GFR: Estimated Creatinine Clearance: 43.7 mL/min (A) (by C-G formula based on SCr of 1.36 mg/dL (H)). Liver Function Tests: Recent Labs  Lab 02/14/18 0747  AST 34  ALT 14  ALKPHOS 76  BILITOT 0.5  PROT 6.2*  ALBUMIN 2.6*   No results for input(s): LIPASE, AMYLASE in the last 168 hours. No results for input(s): AMMONIA in the last 168 hours. Coagulation Profile: Recent Labs  Lab 02/14/18 0747  INR 1.25   Cardiac Enzymes: No results for input(s): CKTOTAL, CKMB, CKMBINDEX, TROPONINI in the last 168 hours. BNP (last 3 results) No results for input(s): PROBNP in the last 8760 hours. HbA1C: Recent Labs    02/18/18 1115  HGBA1C 8.6*   CBG: Recent Labs  Lab 02/18/18 2108 02/19/18 0804 02/19/18 1124 02/19/18 1630 02/19/18 2115  GLUCAP 260* 160* 205* 216* 219*   Lipid Profile: No results for input(s): CHOL, HDL, LDLCALC, TRIG, CHOLHDL, LDLDIRECT in the last 72 hours. Thyroid Function Tests: No results for input(s): TSH, T4TOTAL,  FREET4, T3FREE, THYROIDAB in the last 72 hours. Anemia Panel: No results for input(s): VITAMINB12, FOLATE, FERRITIN, TIBC, IRON, RETICCTPCT in the last 72 hours. Sepsis Labs: Recent Labs  Lab 02/14/18 0747 02/14/18 0748 02/14/18 0958  PROCALCITON 0.16  --   --   LATICACIDVEN  --  1.4 0.9    Recent Results (from the past 240 hour(s))  Culture, blood (x 2)     Status: None   Collection Time: 02/14/18  7:40 AM  Result Value Ref Range Status   Specimen Description BLOOD RIGHT ANTECUBITAL  Final   Special Requests   Final    BOTTLES DRAWN AEROBIC ONLY Blood Culture results may not be optimal due to an inadequate volume of blood received in culture bottles   Culture   Final    NO GROWTH 5 DAYS Performed at [facility], [address],  [place], [place] [address]    Report Status 02/19/2018 FINAL  Final  Culture, blood (x 2)     Status: None   Collection Time: 02/14/18  7:48 AM  Result Value Ref Range Status   Specimen Description BLOOD RIGHT ANTECUBITAL  Final   Special Requests   Final    BOTTLES DRAWN AEROBIC ONLY Blood Culture adequate volume   Culture   Final    NO GROWTH 5 DAYS Performed at [facility], [address] [place], [place] [address]    Report Status 02/19/2018 FINAL  Final  Respiratory Panel by PCR     Status: Abnormal   Collection Time: 02/14/18 11:23 AM  Result Value Ref Range Status   Adenovirus NOT DETECTED NOT DETECTED Final   Coronavirus 229E NOT DETECTED NOT DETECTED Final   Coronavirus HKU1 NOT DETECTED NOT DETECTED Final   Coronavirus NL63 NOT DETECTED NOT DETECTED Final   Coronavirus OC43 NOT DETECTED NOT DETECTED Final   Metapneumovirus NOT DETECTED NOT DETECTED Final   Rhinovirus / Enterovirus NOT DETECTED NOT DETECTED Final   Influenza A NOT DETECTED NOT DETECTED Final   Influenza B NOT DETECTED NOT DETECTED Final   Parainfluenza Virus 1 NOT DETECTED NOT DETECTED Final   Parainfluenza Virus 2 NOT DETECTED NOT DETECTED Final   Parainfluenza Virus 3 NOT DETECTED NOT DETECTED Final   Parainfluenza Virus 4 NOT DETECTED NOT DETECTED Final   Respiratory Syncytial Virus DETECTED (A) NOT DETECTED Final    Comment: CRITICAL RESULT CALLED TO, READ BACK BY AND VERIFIED WITH: RN J PERRIN [phone] [id number]    Bordetella pertussis NOT DETECTED NOT DETECTED Final   Chlamydophila pneumoniae NOT DETECTED NOT DETECTED Final   Mycoplasma pneumoniae NOT DETECTED NOT DETECTED Final    Comment: Performed at [facility], [address], [place], [place] [address]         Radiology Studies: Dg Chest Port 1 View  Result Date: 02/19/2018 CLINICAL DATA:  Dyspnea. EXAM: PORTABLE CHEST 1 VIEW COMPARISON:  02/17/2018 FINDINGS: Lungs are adequately inflated with persistent hazy prominence of the  perihilar vessels likely mild interstitial edema. No significant effusion. Mild stable cardiomegaly. Remainder of the exam is unchanged. IMPRESSION: Mild stable cardiomegaly with suggestion of mild interstitial edema unchanged. Electronically Signed   By: Elberta Fortis M.D.   On: 02/19/2018 13:36        Scheduled Meds: . albuterol  2.5 mg Nebulization BID  . aspirin EC  81 mg Oral Daily  . atorvastatin  80 mg Oral q1800  . chlorhexidine  15 mL Mouth Rinse BID  .  clopidogrel  75 mg Oral Daily  . furosemide  40 mg Intravenous BID  . gabapentin  300 mg Oral TID  . guaiFENesin  1,200 mg Oral BID  . heparin  5,000 Units Subcutaneous Q8H  . insulin aspart  0-15 Units Subcutaneous TID WC  . insulin aspart  0-5 Units Subcutaneous QHS  . insulin aspart  4 Units Subcutaneous TID WC  . insulin glargine  16 Units Subcutaneous BID  . mouth rinse  15 mL Mouth Rinse q12n4p  . metoprolol succinate  75 mg Oral Daily  . pantoprazole  40 mg Oral Daily  . predniSONE  40 mg Oral Q breakfast  . sodium chloride flush  3 mL Intravenous Q12H   Continuous Infusions: . sodium chloride 10 mL/hr at 02/20/18 0500  . azithromycin Stopped (02/19/18 1510)  . ceFEPime (MAXIPIME) IV 2 g (02/20/18 0923)     LOS: 7 days     Jacquelin Hawkingalph , MD [facility] 02/20/2018, 9:29 AM  If 7PM-7AM, please contact night-coverage [email or web address]

## 2018-02-20 NOTE — Care Management Note (Addendum)
Case Management Note  Patient Details  Name: [name] [name] MRN: [id number] Date of Birth: December 30, 1950  Subjective/Objective:  Pt presented for Acute on Chronic Systolic Heart Failure. PTA from home with the support of daughter. Patient has DME Doctor, hospital. PT recommendations for [facility] PT Services. Patient is declining HH services at this time.                 Action/Plan: CM did ask patient if it was ok to call the daughter to make sure that daughter will be able to provide the 24 hour supervision. No further needs from CM at this time.   Expected Discharge Date:                  Expected Discharge Plan:  Home/Self Care  In-House Referral:  NA  Discharge planning Services  CM Consult  Post Acute Care Choice:  NA Choice offered to:  NA  DME Arranged:  N/A DME Agency:  NA  HH Arranged:  Patient Refused HH HH Agency:  NA  Status of Service:  Completed, signed off  If discussed at Long Length of Stay Meetings, dates discussed:    Additional Comments: 1138 02-20-18 Tomi Bamberger, RN,BSN Case Manager [phone] CM received call back from daughter. She will be able to assist patient in the home. Aware that patient declined [facility] Services.  Gala Lewandowsky, RN 02/20/2018, 11:25 AM

## 2018-02-21 LAB — BASIC METABOLIC PANEL
Anion gap: 8 (ref 5–15)
BUN: 62 mg/dL — ABNORMAL HIGH (ref 8–23)
CO2: 26 mmol/L (ref 22–32)
Calcium: 8.6 mg/dL — ABNORMAL LOW (ref 8.9–10.3)
Chloride: 109 mmol/L (ref 98–111)
Creatinine, Ser: 1.27 mg/dL — ABNORMAL HIGH (ref 0.44–1.00)
GFR, EST AFRICAN AMERICAN: 51 mL/min — AB (ref >60)
GFR, EST NON AFRICAN AMERICAN: 44 mL/min — AB (ref >60)
Glucose, Bld: 154 mg/dL — ABNORMAL HIGH (ref 70–99)
Potassium: 4.2 mmol/L (ref 3.5–5.1)
Sodium: 143 mmol/L (ref 135–145)

## 2018-02-21 LAB — GLUCOSE, CAPILLARY
Glucose-Capillary: 91 mg/dL (ref 70–99)
Glucose-Capillary: 120 mg/dL — ABNORMAL HIGH (ref 70–99)
Glucose-Capillary: 188 mg/dL — ABNORMAL HIGH (ref 70–99)

## 2018-02-21 MED ORDER — GUAIFENESIN ER 600 MG PO TB12: 1200.0000 mg | ORAL_TABLET | Freq: Two times a day (BID) | ORAL | 0 refills | Status: AC

## 2018-02-21 MED ORDER — FUROSEMIDE 40 MG PO TABS: 40.0000 mg | ORAL_TABLET | Freq: Two times a day (BID) | ORAL | 0 refills | Status: DC

## 2018-02-21 MED ORDER — LEVOFLOXACIN 250 MG PO TABS: ORAL_TABLET | ORAL | 0 refills | Status: AC

## 2018-02-21 NOTE — Discharge Summary (Signed)
Physician Discharge Summary  [name] [name] MVH:[id number] DOB: [birth year] DOA: 02/13/2018  PCP: Georgann Housekeeper, MD  Admit date: 02/13/2018 Discharge date: 02/21/2018  Admitted From: Home Disposition: Home  Recommendations for Outpatient Follow-up:  1. Follow up with PCP in 1 week 2. Please obtain BMP/CBC in one week 3. Please follow up on the following pending results: None  Home Health: Recommended SNF or HH, but patient denclined Equipment/Devices: None  Discharge Condition: Stable CODE STATUS: Full code Diet recommendation: Heart healthy/carb modified   Brief/Interim Summary:  Admission HPI written by Marinda Elk, MD   Chief Complaint: Respiratory distress   HPI: [name] [name] is a 68 y.o. female with medical history significant for CAD with stent, PAD status post bilateral lower extremity bypass, hypertension, chronic systolic CHF, insulin-dependent diabetes mellitus, and noncompliance, now presenting to the emergency department with acute respiratory distress.  Patient is accompanied by her daughter who assists with the history.  Patient had reportedly been complaining of worsening bilateral leg swelling and shortness of breath with cough for several days, was noted to be short of breath at rest beginning the night of 02/11/2018, has had orthopnea, continued to worsen, was in acute distress last night, and EMS was called out.  Patient was reported to be severely hypertensive on EMS arrival, using accessory muscles to breathe, was given 2 sublingual nitroglycerin and placed on CPAP, and brought into the ED.  Patient denies any fevers, chills, or chest pain.  Denies calf tenderness or hemoptysis.  ED Course: Upon arrival to the ED, patient is found to be afebrile, in respiratory distress, saturating low 90s on BiPAP, tachypneic, mildly tachycardic, and with stable blood pressure.  EKG features sinus tachycardia with rate 117, nonspecific IVCD, and repolarization  abnormality.  Chest x-ray is notable for cardiomegaly, vascular congestion, and no focal consolidation.  Chemistry panel features a potassium of 5.6, glucose 203, and creatinine of 1.54, up from 1.1 more than a year ago.  CBC is notable for leukocytosis to 13,300 and a mild normocytic anemia.  Troponin is undetectable and BNP elevated 412.  Patient was started on BiPAP immediately in the ED, started on nitroglycerin infusion, and Lasix.  Respiratory status has improved significantly, blood pressure has normalized, patient denies chest pain, she reports subjective improvement but remains dyspneic with speech and will be observed for further evaluation and management of acute on chronic systolic CHF with acute respiratory failure.    Hospital course:  Acute on chronic systolic and diastolic heart failure Transthoracic Echocardiogram significant for an EF of 45%, diffuse hypokinesis, moderate (Grade 2) diastolic dysfunction. Diuresed with Lasix IV with good output. Transitioned to Lasix 40 mg BID PO. Outpatient follow-up with repeat basic metabolic panel.  Bibasilar pneumonia Patient empirically treated with Vancomycin and Cefepime with improvement. Azithromycin started secondary to continued fever. Transitioned to Levaquin on discharge.  COPD exacerbation Patient started on treatment secondary to wheezing. Improved with IV solumedrol which was switched to prednisone. No wheezing on discharge.  RSV infection Supportive care  CAD/PAD Asymptomatic. Continue Plavix, metoprolol  Diabetes mellitus, type 2 Worsened control in setting of steroids. Continue home Levemir and metformin on discharge.  Acute respiratory failure with hypoxia Secondary to pneumonia/RSV infection. Resolved.  Essential hypertension Pretty well controlled. Continue metoprolol.  Acute kidney injury on CKD stage III Baseline creatinine of 1.4-1.5. Creatinine peaked at 1.97 and is currently back down to baseline.  Likely cardiorenal in setting of acute heart failure. Creatinine now stable.  Discharge Diagnoses:  Principal Problem:   Acute on chronic systolic CHF (congestive heart failure) (HCC) Active Problems:   PAD (peripheral artery disease) (HCC)   Coronary artery disease involving native coronary artery of native heart without angina pectoris   Diabetes mellitus type II, uncontrolled (HCC)   Hypertensive heart disease with chronic diastolic congestive heart failure (HCC)   Acute respiratory failure with hypoxia (HCC)   Uncontrolled type 2 diabetes mellitus with complication (HCC)   Benign hypertension   AKI (acute kidney injury) (HCC)   Acute systolic CHF (congestive heart failure) (HCC)    Discharge Instructions  Discharge Instructions    Diet - low sodium heart healthy   Complete by:  As directed    Increase activity slowly   Complete by:  As directed      Allergies as of 02/21/2018      Reactions   Doxycycline Other (See Comments)   lethargy   Hydrochlorothiazide Other (See Comments)   lethargic    Latex Rash   Penicillins Swelling, Rash   Pt states she has tolerated Keflex in the past without problems. States she may have tolerated Augmentin in the past but it caused GI upset. Has patient had a PCN reaction causing immediate rash, facial/tongue/throat swelling, SOB or lightheadedness with hypotension: Yes Has patient had a PCN reaction causing severe rash involving mucus membranes or skin necrosis: No Has patient had a PCN reaction that required hospitalization No Has patient had a PCN reaction occurring within the last 10 years: No      Medication List    STOP taking these medications   amLODipine 5 MG tablet Commonly known as:  NORVASC     TAKE these medications   aspirin EC 81 MG tablet Take 81 mg by mouth daily.   atorvastatin 80 MG tablet Commonly known as:  LIPITOR Take 80 mg by mouth. Take one tablet daily for hyperlipidemia   clopidogrel 75 MG  tablet Commonly known as:  PLAVIX Take 75 mg by mouth. Take one tablet daily for PVD   furosemide 40 MG tablet Commonly known as:  LASIX Take 1 tablet (40 mg total) by mouth 2 (two) times daily. What changed:  when to take this   gabapentin 300 MG capsule Commonly known as:  NEURONTIN Take 300 mg by mouth 3 (three) times daily.   guaiFENesin 600 MG 12 hr tablet Commonly known as:  MUCINEX Take 2 tablets (1,200 mg total) by mouth 2 (two) times daily for 5 days.   isosorbide mononitrate 60 MG 24 hr tablet Commonly known as:  IMDUR Take 90 mg by mouth daily. Take 1 tablet by mouth along with 30 mg to equal 90 mg daily   isosorbide mononitrate 30 MG 24 hr tablet Commonly known as:  IMDUR Take 90 mg by mouth daily. Take 1 tablet along by mouth with 60 mg to equal 90 mg daily.   LEVEMIR FLEXTOUCH 100 UNIT/ML Pen Generic drug:  Insulin Detemir Inject 35 Units into the skin at bedtime.   levofloxacin 250 MG tablet Commonly known as:  LEVAQUIN Take 2 tablets (500 mg total) by mouth daily for 1 day, THEN 1 tablet (250 mg total) daily for 1 day. Start taking on:  February 22, 2018   losartan 100 MG tablet Commonly known as:  COZAAR TAKE 1 TABLET BY MOUTH EVERY DAY   metFORMIN 1000 MG tablet Commonly known as:  GLUCOPHAGE Take 1,000 mg by mouth 2 (two) times daily with a meal.   metoprolol succinate 50  MG 24 hr tablet Commonly known as:  TOPROL-XL TAKE 1 AND 1/2 TABLETS BY MOUTH DAILY( NEW DOSE) What changed:  See the new instructions.   pantoprazole 40 MG tablet Commonly known as:  PROTONIX TAKE 1 TABLET BY MOUTH DAILY   potassium chloride 10 MEQ tablet Commonly known as:  K-DUR,KLOR-CON Take 10 mEq by mouth daily.       Allergies  Allergen Reactions  . Doxycycline Other (See Comments)    lethargy  . Hydrochlorothiazide Other (See Comments)    lethargic   . Latex Rash  . Penicillins Swelling and Rash    Pt states she has tolerated Keflex in the past without  problems. States she may have tolerated Augmentin in the past but it caused GI upset. Has patient had a PCN reaction causing immediate rash, facial/tongue/throat swelling, SOB or lightheadedness with hypotension: Yes Has patient had a PCN reaction causing severe rash involving mucus membranes or skin necrosis: No Has patient had a PCN reaction that required hospitalization No Has patient had a PCN reaction occurring within the last 10 years: No    Consultations:  None   Procedures/Studies: Ct Chest Wo Contrast  Result Date: 02/14/2018 CLINICAL DATA:  Hypoxic, fever of unknown origin EXAM: CT CHEST WITHOUT CONTRAST TECHNIQUE: Multidetector CT imaging of the chest was performed following the standard protocol without IV contrast. COMPARISON:  None. FINDINGS: Cardiovascular: No significant vascular findings. Normal heart size. No pericardial effusion. Thoracic aortic atherosclerosis. Mediastinum/Nodes: Subcarinal lymphadenopathy measuring 2 cm in short axis. Multiple other smaller mediastinal lymph nodes. No axillary lymphadenopathy. Trachea, esophagus and thyroid are normal. Lungs/Pleura: Small bilateral pleural effusions. Dense left lower lobe consolidation. Mild right lobe airspace disease. No pneumothorax. Patchy areas of ground-glass opacity in the left upper lobe. Upper Abdomen: No acute abnormality. Musculoskeletal: No acute osseous abnormality. No aggressive osseous lesion. IMPRESSION: 1. Bilateral lower lobe pneumonia, left greater than right. Mild reactive mediastinal lymphadenopathy. Followup PA and lateral chest X-ray is recommended in 3-4 weeks following trial of antibiotic therapy to ensure resolution and exclude underlying malignancy. 2.  Aortic Atherosclerosis (ICD10-I70.0). Electronically Signed   By: Elige KoHetal  Patel   On: 02/14/2018 13:23   Dg Chest Port 1 View  Result Date: 02/19/2018 CLINICAL DATA:  Dyspnea. EXAM: PORTABLE CHEST 1 VIEW COMPARISON:  02/17/2018 FINDINGS: Lungs are  adequately inflated with persistent hazy prominence of the perihilar vessels likely mild interstitial edema. No significant effusion. Mild stable cardiomegaly. Remainder of the exam is unchanged. IMPRESSION: Mild stable cardiomegaly with suggestion of mild interstitial edema unchanged. Electronically Signed   By: Elberta Fortisaniel  Boyle M.D.   On: 02/19/2018 13:36   Dg Chest Port 1 View  Result Date: 02/17/2018 CLINICAL DATA:  Dyspnea. EXAM: PORTABLE CHEST 1 VIEW COMPARISON:  Four days ago FINDINGS: Diffuse interstitial and airspace opacity with airway cuffing. Dense consolidation in the left lower lobe on 02/14/2018 chest CT is improved. Cardiomegaly. Possible small left pleural effusion. IMPRESSION: CHF pattern. Improved consolidation in the left lower lobe seen on chest CT 02/14/2018. Electronically Signed   By: Marnee SpringJonathon  Watts M.D.   On: 02/17/2018 07:39   Dg Chest Portable 1 View  Result Date: 02/13/2018 CLINICAL DATA:  68 year old female with increased shortness of breath. EXAM: PORTABLE CHEST 1 VIEW COMPARISON:  Chest radiograph dated 10/21/2016 FINDINGS: There is mild cardiomegaly and mild vascular congestion. No focal consolidation, pleural effusion, or pneumothorax. Atherosclerotic calcification of the aortic arch. No acute osseous pathology. IMPRESSION: Cardiomegaly with mild vascular congestion. No focal consolidation. Electronically Signed  By: Elgie CollardArash  Radparvar M.D.   On: 02/13/2018 02:16     Transthoracic Echocardiogram (02/13/2018)  Transthoracic Echocardiogram (02/13/2018) Study Conclusions  - Left ventricle: The cavity size was normal. Wall thickness was normal. The estimated ejection fraction was 45%. Diffuse hypokinesis. Features are consistent with a pseudonormal left ventricular filling pattern, with concomitant abnormal relaxation and increased filling pressure (grade 2 diastolic dysfunction). - Aortic valve: There was no stenosis. - Mitral valve: Mildly calcified annulus.  There was no significant regurgitation. - Left atrium: The atrium was mildly dilated. - Right ventricle: The cavity size was normal. Systolic function was normal. - Pulmonary arteries: No complete TR doppler jet so unable to estimate PA systolic pressure. - Systemic veins: IVC measured 2.5 cm with > 50% respirophasic variation, suggesting RA pressure 8 mmHg. - Pericardium, extracardiac: A trivial pericardial effusion was identified.  Impressions:  - Normal LV size with EF 45%, diffuse hypokinesis. Moderate diastolic dysfunction. Normal RV size and systolic function. No significant valvular abnormalities.   Subjective: No dyspnea.   Discharge Exam: Vitals:   02/21/18 0929 02/21/18 1246  BP: (!) 141/58 (!) 152/69  Pulse: 84 81  Resp:    Temp: 98.1 F (36.7 C) 98.2 F (36.8 C)  SpO2: 95% 95%   Vitals:   02/21/18 0404 02/21/18 0740 02/21/18 0929 02/21/18 1246  BP: 127/61  (!) 141/58 (!) 152/69  Pulse: 80 78 84 81  Resp: 19 18    Temp: 98.8 F (37.1 C)  98.1 F (36.7 C) 98.2 F (36.8 C)  TempSrc: Oral  Axillary Oral  SpO2: 99% 97% 95% 95%  Weight: 88.1 kg     Height:        General: Pt is alert, awake, not in acute distress Cardiovascular: RRR, S1/S2 +, no rubs, no gallops Respiratory: Mild rales, no wheezing, no rhonchi Abdominal: Soft, NT, ND, bowel sounds + Extremities:  no cyanosis    The results of significant diagnostics from this hospitalization (including imaging, microbiology, ancillary and laboratory) are listed below for reference.     Microbiology: Recent Results (from the past 240 hour(s))  Culture, blood (x 2)     Status: None   Collection Time: 02/14/18  7:40 AM  Result Value Ref Range Status   Specimen Description BLOOD RIGHT ANTECUBITAL  Final   Special Requests   Final    BOTTLES DRAWN AEROBIC ONLY Blood Culture results may not be optimal due to an inadequate volume of blood received in culture bottles   Culture   Final     NO GROWTH 5 DAYS Performed at [facility], [address] [place], [place] [address]    Report Status 02/19/2018 FINAL  Final  Culture, blood (x 2)     Status: None   Collection Time: 02/14/18  7:48 AM  Result Value Ref Range Status   Specimen Description BLOOD RIGHT ANTECUBITAL  Final   Special Requests   Final    BOTTLES DRAWN AEROBIC ONLY Blood Culture adequate volume   Culture   Final    NO GROWTH 5 DAYS Performed at [facility], [address] [place], [place] [address]    Report Status 02/19/2018 FINAL  Final  Respiratory Panel by PCR     Status: Abnormal   Collection Time: 02/14/18 11:23 AM  Result Value Ref Range Status   Adenovirus NOT DETECTED NOT DETECTED Final   Coronavirus 229E NOT DETECTED NOT DETECTED Final   Coronavirus HKU1 NOT DETECTED NOT DETECTED Final  Coronavirus NL63 NOT DETECTED NOT DETECTED Final   Coronavirus OC43 NOT DETECTED NOT DETECTED Final   Metapneumovirus NOT DETECTED NOT DETECTED Final   Rhinovirus / Enterovirus NOT DETECTED NOT DETECTED Final   Influenza A NOT DETECTED NOT DETECTED Final   Influenza B NOT DETECTED NOT DETECTED Final   Parainfluenza Virus 1 NOT DETECTED NOT DETECTED Final   Parainfluenza Virus 2 NOT DETECTED NOT DETECTED Final   Parainfluenza Virus 3 NOT DETECTED NOT DETECTED Final   Parainfluenza Virus 4 NOT DETECTED NOT DETECTED Final   Respiratory Syncytial Virus DETECTED (A) NOT DETECTED Final    Comment: CRITICAL RESULT CALLED TO, READ BACK BY AND VERIFIED WITH: RN J PERRIN [id number] MLM    Bordetella pertussis NOT DETECTED NOT DETECTED Final   Chlamydophila pneumoniae NOT DETECTED NOT DETECTED Final   Mycoplasma pneumoniae NOT DETECTED NOT DETECTED Final    Comment: Performed at [facility], [address], [place], [place] [address]     Labs: BNP (last 3 results) Recent Labs    02/13/18 0152  BNP 411.9*   Basic Metabolic Panel: Recent Labs  Lab 02/17/18 0534 02/18/18 0453  02/19/18 0956 02/20/18 0510 02/21/18 0349  NA 135 137 139 142 143  K 4.2 4.1 4.0 4.2 4.2  CL 102 105 109 111 109  CO2 22 21* 22 24 26   GLUCOSE 264* 316* 233* 119* 154*  BUN 59* 67* 73* 70* 62*  CREATININE 1.74* 1.67* 1.38* 1.36* 1.27*  CALCIUM 8.6* 8.6* 8.6* 8.7* 8.6*   Liver Function Tests: No results for input(s): AST, ALT, ALKPHOS, BILITOT, PROT, ALBUMIN in the last 168 hours. No results for input(s): LIPASE, AMYLASE in the last 168 hours. No results for input(s): AMMONIA in the last 168 hours. CBC: Recent Labs  Lab 02/15/18 0538 02/16/18 0222 02/18/18 1115 02/20/18 1011  WBC 7.4 5.3 7.7 15.4*  HGB 8.5* 8.2* 8.3* 8.4*  HCT 24.9* 24.6* 24.8* 24.8*  MCV 88.0 86.6 85.5 87.6  PLT 181 178 193 268   Cardiac Enzymes: No results for input(s): CKTOTAL, CKMB, CKMBINDEX, TROPONINI in the last 168 hours. BNP: Invalid input(s): POCBNP CBG: Recent Labs  Lab 02/20/18 1147 02/20/18 1605 02/20/18 2124 02/21/18 0736 02/21/18 1116  GLUCAP 119* 128* 287* 91 120*   D-Dimer No results for input(s): DDIMER in the last 72 hours. Hgb A1c No results for input(s): HGBA1C in the last 72 hours. Lipid Profile No results for input(s): CHOL, HDL, LDLCALC, TRIG, CHOLHDL, LDLDIRECT in the last 72 hours. Thyroid function studies No results for input(s): TSH, T4TOTAL, T3FREE, THYROIDAB in the last 72 hours.  Invalid input(s): FREET3 Anemia work up No results for input(s): VITAMINB12, FOLATE, FERRITIN, TIBC, IRON, RETICCTPCT in the last 72 hours. Urinalysis    Component Value Date/Time   COLORURINE YELLOW 09/05/2015 0641   APPEARANCEUR CLEAR 09/05/2015 0641   LABSPEC 1.013 09/05/2015 0641   PHURINE 6.0 09/05/2015 0641   GLUCOSEU 100 (A) 09/05/2015 0641   HGBUR TRACE (A) 09/05/2015 0641   BILIRUBINUR NEGATIVE 09/05/2015 0641   KETONESUR NEGATIVE 09/05/2015 0641   PROTEINUR 100 (A) 09/05/2015 0641   UROBILINOGEN 1.0 10/16/2009 0243   NITRITE NEGATIVE 09/05/2015 0641   LEUKOCYTESUR  NEGATIVE 09/05/2015 0641   Sepsis Labs Invalid input(s): PROCALCITONIN,  WBC,  LACTICIDVEN Microbiology Recent Results (from the past 240 hour(s))  Culture, blood (x 2)     Status: None   Collection Time: 02/14/18  7:40 AM  Result Value Ref Range Status   Specimen Description  BLOOD RIGHT ANTECUBITAL  Final   Special Requests   Final    BOTTLES DRAWN AEROBIC ONLY Blood Culture results may not be optimal due to an inadequate volume of blood received in culture bottles   Culture   Final    NO GROWTH 5 DAYS Performed at [facility], [address] [place], [place] [address]    Report Status 02/19/2018 FINAL  Final  Culture, blood (x 2)     Status: None   Collection Time: 02/14/18  7:48 AM  Result Value Ref Range Status   Specimen Description BLOOD RIGHT ANTECUBITAL  Final   Special Requests   Final    BOTTLES DRAWN AEROBIC ONLY Blood Culture adequate volume   Culture   Final    NO GROWTH 5 DAYS Performed at [facility], [address], [place], [place] [address]    Report Status 02/19/2018 FINAL  Final  Respiratory Panel by PCR     Status: Abnormal   Collection Time: 02/14/18 11:23 AM  Result Value Ref Range Status   Adenovirus NOT DETECTED NOT DETECTED Final   Coronavirus 229E NOT DETECTED NOT DETECTED Final   Coronavirus HKU1 NOT DETECTED NOT DETECTED Final   Coronavirus NL63 NOT DETECTED NOT DETECTED Final   Coronavirus OC43 NOT DETECTED NOT DETECTED Final   Metapneumovirus NOT DETECTED NOT DETECTED Final   Rhinovirus / Enterovirus NOT DETECTED NOT DETECTED Final   Influenza A NOT DETECTED NOT DETECTED Final   Influenza B NOT DETECTED NOT DETECTED Final   Parainfluenza Virus 1 NOT DETECTED NOT DETECTED Final   Parainfluenza Virus 2 NOT DETECTED NOT DETECTED Final   Parainfluenza Virus 3 NOT DETECTED NOT DETECTED Final   Parainfluenza Virus 4 NOT DETECTED NOT DETECTED Final   Respiratory Syncytial Virus DETECTED (A) NOT DETECTED Final    Comment: CRITICAL  RESULT CALLED TO, READ BACK BY AND VERIFIED WITH: RN J PERRIN [id number] MLM    Bordetella pertussis NOT DETECTED NOT DETECTED Final   Chlamydophila pneumoniae NOT DETECTED NOT DETECTED Final   Mycoplasma pneumoniae NOT DETECTED NOT DETECTED Final    Comment: Performed at [facility], [address], [place], [place] [address]    SIGNED:   Jacquelin Hawking, MD [facility] 02/21/2018, 2:08 PM

## 2018-02-21 NOTE — Discharge Instructions (Addendum)
[name] [name],  You were in the hospital because of breathing issues. This was because of your heart failure but also made worse by a viral infection and bacterial infection. You have been treated with antibiotics and a diuretic. We recommended rehab or home health for which you have declined. Please follow-up with your primary care physician and heart doctor.   Community-Acquired Pneumonia, Adult Pneumonia is an infection of the lungs. It causes swelling in the airways of the lungs. Mucus and fluid may also build up inside the airways. One type of pneumonia can happen while a person is in a hospital. A different type can happen when a person is not in a hospital (community-acquired pneumonia).  What are the causes?  This condition is caused by germs (viruses, bacteria, or fungi). Some types of germs can be passed from one person to another. This can happen when you breathe in droplets from the cough or sneeze of an infected person. What increases the risk? You are more likely to develop this condition if you:  Have a long-term (chronic) disease, such as: ? Chronic obstructive pulmonary disease (COPD). ? Asthma. ? Cystic fibrosis. ? Congestive heart failure. ? Diabetes. ? Kidney disease.  Have HIV.  Have sickle cell disease.  Have had your spleen removed.  Do not take good care of your teeth and mouth (poor dental hygiene).  Have a medical condition that increases the risk of breathing in droplets from your own mouth and nose.  Have a weakened body defense system (immune system).  Are a smoker.  Travel to areas where the germs that cause this illness are common.  Are around certain animals or the places they live. What are the signs or symptoms?  A dry cough.  A wet (productive) cough.  Fever.  Sweating.  Chest pain. This often happens when breathing deeply or coughing.  Fast breathing or trouble breathing.  Shortness of breath.  Shaking chills.  Feeling tired  (fatigue).  Muscle aches. How is this treated? Treatment for this condition depends on many things. Most adults can be treated at home. In some cases, treatment must happen in a hospital. Treatment may include:  Medicines given by mouth or through an IV tube.  Being given extra oxygen.  Respiratory therapy. In rare cases, treatment for very bad pneumonia may include:  Using a machine to help you breathe.  Having a procedure to remove fluid from around your lungs. Follow these instructions at home: Medicines  Take over-the-counter and prescription medicines only as told by your doctor. ? Only take cough medicine if you are losing sleep.  If you were prescribed an antibiotic medicine, take it as told by your doctor. Do not stop taking the antibiotic even if you start to feel better. General instructions   Sleep with your head and neck raised (elevated). You can do this by sleeping in a recliner or by putting a few pillows under your head.  Rest as needed. Get at least 8 hours of sleep each night.  Drink enough water to keep your pee (urine) pale yellow.  Eat a healthy diet that includes plenty of vegetables, fruits, whole grains, low-fat dairy products, and lean protein.  Do not use any products that contain nicotine or tobacco. These include cigarettes, e-cigarettes, and chewing tobacco. If you need help quitting, ask your doctor.  Keep all follow-up visits as told by your doctor. This is important. How is this prevented? A shot (vaccine) can help prevent pneumonia. Shots are  often suggested for:  People older than 41 years of [name].  People older than 28 years of [name] who: ? Are having cancer treatment. ? Have long-term (chronic) lung disease. ? Have problems with their body's defense system. You may also prevent pneumonia if you take these actions:  Get the flu (influenza) shot every year.  Go to the dentist as often as told.  Wash your hands often. If you cannot use  soap and water, use hand sanitizer. Contact a doctor if:  You have a fever.  You lose sleep because your cough medicine does not help. Get help right away if:  You are short of breath and it gets worse.  You have more chest pain.  Your sickness gets worse. This is very serious if: ? You are an older adult. ? Your body's defense system is weak.  You cough up blood. Summary  Pneumonia is an infection of the lungs.  Most adults can be treated at home. Some will need treatment in a hospital.  Drink enough water to keep your pee pale yellow.  Get at least 8 hours of sleep each night. This information is not intended to replace advice given to you by your health care provider. Make sure you discuss any questions you have with your health care provider. Document Released: 07/03/2007 Document Revised: 09/11/2017 Document Reviewed: 09/11/2017 Elsevier Interactive Patient Education  2019 [employer]    Heart Failure  Heart failure means your heart has trouble pumping blood. This makes it hard for your body to work well. Heart failure is usually a long-term (chronic) condition. You must take good care of yourself and follow your treatment plan from your doctor. Follow these instructions at home: Medicines  Take over-the-counter and prescription medicines only as told by your doctor. ? Do not stop taking your medicine unless your doctor told you to do that. ? Do not skip any doses. ? Refill your prescriptions before you run out of medicine. You need your medicines every day. Eating and drinking   Eat heart-healthy foods. Talk with a diet and nutrition specialist (dietitian) to make an eating plan.  Choose foods that: ? Have no trans fat. ? Are low in saturated fat and cholesterol.  Choose healthy foods, like: ? Fresh or frozen fruits and vegetables. ? Fish. ? Low-fat (lean) meats. ? Legumes (like beans, peas, and lentils). ? Fat-free or low-fat dairy  products. ? Whole-grain foods. ? High-fiber foods.  Limit salt (sodium) if told by your doctor. Ask your nutrition specialist to recommend heart-healthy seasonings.  Cook in healthy ways instead of frying. Healthy ways of cooking include: ? Roasting. ? Grilling. ? Broiling. ? Baking. ? Poaching. ? Steaming. ? Stir-frying.  Limit how much fluid you drink, if told by your doctor. Lifestyle  Do not smoke or use chewing tobacco. Do not use nicotine gum or patches before talking to your doctor.  Limit alcohol intake to no more than 1 drink a day for non-pregnant women and 2 drinks a day for men. One drink equals 12 oz of beer, 5 oz of wine, or 1 oz of hard liquor. ? Tell your doctor if you drink alcohol many times a week. ? Talk with your doctor about whether any alcohol is safe for you. ? You should stop drinking alcohol: ? If your heart has been damaged by alcohol. ? You have very bad heart failure.  Do not use illegal drugs.  Lose weight if told by your doctor.  Do moderate  physical activity if told by your doctor. Ask your doctor what activities are safe for you if: ? You are of older [name] (elderly). ? You have very bad heart failure. Keep track of important information  Weigh yourself every day. ? Weigh yourself every morning after you pee (urinate) and before breakfast. ? Wear the same amount of clothing each time. ? Write down your daily weight. Give your record to your doctor.  Check and write down your blood pressure as told by your doctor.  Check your pulse as told by your doctor. Dealing with heat and cold  If the weather is very hot: ? Avoid activity that takes a lot of energy. ? Use air conditioning or fans, or find a cooler place. ? Avoid caffeine. ? Avoid alcohol. ? Wear clothing that is loose-fitting, lightweight, and light-colored.  If the weather is very cold: ? Avoid activity that takes a lot of energy. ? Layer your clothes. ? Wear mittens or  gloves, a hat, and a scarf when you go outside. ? Avoid alcohol. General instructions  Manage other conditions that you have as told by your doctor.  Learn to manage stress. If you need help, ask your doctor.  Plan rest periods for when you get tired.  Get education and support as needed.  Get rehab (rehabilitation) to help you stay independent and to help with everyday tasks.  Stay up to date with shots (immunizations), especially pneumococcal and flu (influenza) shots.  Keep all follow-up visits as told by your doctor. This is important. Contact a doctor if:  You gain weight quickly.  You are more short of breath than normal.  You cannot do your normal activities.  You tire easily.  You cough more than normal, especially with activity.  You have any or more puffiness (swelling) in areas such as your hands, feet, ankles, or belly (abdomen).  You cannot sleep because it is hard to breathe.  You feel like your heart is beating fast (palpitations).  You get dizzy or light-headed when you stand up. Get help right away if:  You have trouble breathing.  You or someone else notices a change in your awareness. This could be trouble staying awake or trouble concentrating.  You have chest pain or discomfort.  You pass out (faint). Summary  Heart failure means your heart has trouble pumping blood.  Make sure you refill your prescriptions before you run out of medicine. You need your medicines every day.  Keep records of your weight and blood pressure to give to your doctor.  Contact a doctor if you gain weight quickly. This information is not intended to replace advice given to you by your health care provider. Make sure you discuss any questions you have with your health care provider. Document Released: 10/24/2007 Document Revised: 10/08/2017 Document Reviewed: 02/06/2016 Elsevier Interactive Patient Education  2019 Elsevier Inc.    Low-Sodium Eating Plan Sodium,  which is an element that makes up salt, helps you maintain a healthy balance of fluids in your body. Too much sodium can increase your blood pressure and cause fluid and waste to be held in your body. Your health care provider or dietitian may recommend following this plan if you have high blood pressure (hypertension), kidney disease, liver disease, or heart failure. Eating less sodium can help lower your blood pressure, reduce swelling, and protect your heart, liver, and kidneys. What are tips for following this plan? General guidelines  Most people on this plan should limit their  sodium intake to 1,500-2,000 mg (milligrams) of sodium each day. Reading food labels   The Nutrition Facts label lists the amount of sodium in one serving of the food. If you eat more than one serving, you must multiply the listed amount of sodium by the number of servings.  Choose foods with less than 140 mg of sodium per serving.  Avoid foods with 300 mg of sodium or more per serving. Shopping  Look for lower-sodium products, often labeled as "low-sodium" or "no salt added."  Always check the sodium content even if foods are labeled as "unsalted" or "no salt added".  Buy fresh foods. ? Avoid canned foods and premade or frozen meals. ? Avoid canned, cured, or processed meats  Buy breads that have less than 80 mg of sodium per slice. Cooking  Eat more home-cooked food and less restaurant, buffet, and fast food.  Avoid adding salt when cooking. Use salt-free seasonings or herbs instead of table salt or sea salt. Check with your health care provider or pharmacist before using salt substitutes.  Cook with plant-based oils, such as canola, sunflower, or olive oil. Meal planning  When eating at a restaurant, ask that your food be prepared with less salt or no salt, if possible.  Avoid foods that contain MSG (monosodium glutamate). MSG is sometimes added to [place] food, bouillon, and some canned  foods. What foods are recommended? The items listed may not be a complete list. Talk with your dietitian about what dietary choices are best for you. Grains Low-sodium cereals, including oats, puffed wheat and rice, and shredded wheat. Low-sodium crackers. Unsalted rice. Unsalted pasta. Low-sodium bread. Whole-grain breads and whole-grain pasta. Vegetables Fresh or frozen vegetables. "No salt added" canned vegetables. "No salt added" tomato sauce and paste. Low-sodium or reduced-sodium tomato and vegetable juice. Fruits Fresh, frozen, or canned fruit. Fruit juice. Meats and other protein foods Fresh or frozen (no salt added) meat, poultry, seafood, and fish. Low-sodium canned tuna and salmon. Unsalted nuts. Dried peas, beans, and lentils without added salt. Unsalted canned beans. Eggs. Unsalted nut butters. Dairy Milk. Soy milk. Cheese that is naturally low in sodium, such as ricotta cheese, fresh mozzarella, or Swiss cheese Low-sodium or reduced-sodium cheese. Cream cheese. Yogurt. Fats and oils Unsalted butter. Unsalted margarine with no trans fat. Vegetable oils such as canola or olive oils. Seasonings and other foods Fresh and dried herbs and spices. Salt-free seasonings. Low-sodium mustard and ketchup. Sodium-free salad dressing. Sodium-free light mayonnaise. Fresh or refrigerated horseradish. Lemon juice. Vinegar. Homemade, reduced-sodium, or low-sodium soups. Unsalted popcorn and pretzels. Low-salt or salt-free chips. What foods are not recommended? The items listed may not be a complete list. Talk with your dietitian about what dietary choices are best for you. Grains Instant hot cereals. Bread stuffing, pancake, and biscuit mixes. Croutons. Seasoned rice or pasta mixes. Noodle soup cups. Boxed or frozen macaroni and cheese. Regular salted crackers. Self-rising flour. Vegetables Sauerkraut, pickled vegetables, and relishes. Olives. [place] fries. Onion rings. Regular canned vegetables  (not low-sodium or reduced-sodium). Regular canned tomato sauce and paste (not low-sodium or reduced-sodium). Regular tomato and vegetable juice (not low-sodium or reduced-sodium). Frozen vegetables in sauces. Meats and other protein foods Meat or fish that is salted, canned, smoked, spiced, or pickled. Bacon, ham, sausage, hotdogs, corned beef, chipped beef, packaged lunch meats, salt pork, jerky, pickled herring, anchovies, regular canned tuna, sardines, salted nuts. Dairy Processed cheese and cheese spreads. Cheese curds. Blue cheese. Feta cheese. String cheese. Regular cottage cheese. Buttermilk. Canned  milk. Fats and oils Salted butter. Regular margarine. Ghee. Bacon fat. Seasonings and other foods Onion salt, garlic salt, seasoned salt, table salt, and sea salt. Canned and packaged gravies. Worcestershire sauce. Tartar sauce. Barbecue sauce. Teriyaki sauce. Soy sauce, including reduced-sodium. Steak sauce. Fish sauce. Oyster sauce. Cocktail sauce. Horseradish that you find on the shelf. Regular ketchup and mustard. Meat flavorings and tenderizers. Bouillon cubes. Hot sauce and Tabasco sauce. Premade or packaged marinades. Premade or packaged taco seasonings. Relishes. Regular salad dressings. Salsa. Potato and tortilla chips. Corn chips and puffs. Salted popcorn and pretzels. Canned or dried soups. Pizza. Frozen entrees and pot pies. Summary  Eating less sodium can help lower your blood pressure, reduce swelling, and protect your heart, liver, and kidneys.  Most people on this plan should limit their sodium intake to 1,500-2,000 mg (milligrams) of sodium each day.  Canned, boxed, and frozen foods are high in sodium. Restaurant foods, fast foods, and pizza are also very high in sodium. You also get sodium by adding salt to food.  Try to cook at home, eat more fresh fruits and vegetables, and eat less fast food, canned, processed, or prepared foods. This information is not intended to replace  advice given to you by your health care provider. Make sure you discuss any questions you have with your health care provider. Document Released: 07/06/2001 Document Revised: 01/08/2016 Document Reviewed: 01/08/2016 Elsevier Interactive Patient Education  2019 [employer].

## 2018-02-21 NOTE — Progress Notes (Signed)
Patient ambulated approx 50 feet in hallway without oxygen. Patient became SOB and O2 Sat dropped to 91% while ambulating. Patient O2 Sat dropped to 86% while when placed back in bed, but quickly recovered to 98%. Will continue to monitor.

## 2018-02-21 NOTE — Progress Notes (Signed)
Patient discharged home with belongings. AOx4, in stable condition. IV removed intact, pressure held and bandage applied. Discharge instructions reviewed with patient and daughter, questions answered.

## 2018-02-26 ENCOUNTER — Ambulatory Visit: Payer: Medicare Other | Admitting: Podiatry

## 2018-02-27 ENCOUNTER — Other Ambulatory Visit: Payer: Self-pay

## 2018-02-27 NOTE — Patient Outreach (Signed)
[facility]) Care Management  02/27/2018  AYONA HARAWAY 04-Oct-1950 [id number]   EMMI- pneumonia RED ON EMMI ALERT Day # 3 Date: 02/26/2018 Red Alert Reason:  Been to follow-up appointment? No  Fever or chills? Yes  Had diarrhea or felt sick to stomach? Yes    Outreach attempt: no answer.  States voice mail full.     Plan: RN CM will attempt again within 4 business days and send letter.  [name], RN, MSN [facility] Care Management Care Management Coordinator Direct Line [phone] Toll Free: [phone]  Fax: [phone]

## 2018-03-02 ENCOUNTER — Other Ambulatory Visit: Payer: Self-pay

## 2018-03-02 DIAGNOSIS — L853 Xerosis cutis: Secondary | ICD-10-CM | POA: Diagnosis not present

## 2018-03-02 DIAGNOSIS — J449 Chronic obstructive pulmonary disease, unspecified: Secondary | ICD-10-CM | POA: Diagnosis not present

## 2018-03-02 DIAGNOSIS — I700 Atherosclerosis of aorta: Secondary | ICD-10-CM | POA: Diagnosis not present

## 2018-03-02 DIAGNOSIS — Z794 Long term (current) use of insulin: Secondary | ICD-10-CM | POA: Diagnosis not present

## 2018-03-02 DIAGNOSIS — N179 Acute kidney failure, unspecified: Secondary | ICD-10-CM | POA: Diagnosis not present

## 2018-03-02 DIAGNOSIS — E1159 Type 2 diabetes mellitus with other circulatory complications: Secondary | ICD-10-CM | POA: Diagnosis not present

## 2018-03-02 DIAGNOSIS — J189 Pneumonia, unspecified organism: Secondary | ICD-10-CM | POA: Diagnosis not present

## 2018-03-02 DIAGNOSIS — I509 Heart failure, unspecified: Secondary | ICD-10-CM | POA: Diagnosis not present

## 2018-03-02 NOTE — Patient Outreach (Signed)
[facility]) Care Management  03/02/2018  SATHVIKA TAKAYAMA Apr 04, 1950 [id number]   EMMI- pneumonia RED ON EMMI ALERT Day # 3 Date:02/26/2018 Red Alert Reason: Been to follow-up appointment? No  Fever or chills? Yes  Had diarrhea or felt sick to stomach? Yes   EMMI- pneumonia RED ON EMMI ALERT Day # 4 Date:02/27/2018 Red Alert Reason: Wake up with shortness of breath when lying flat? Yes    Outreach attempt: no answer.  States voice mail full.     Plan: RN CM will attempt again within 4 business days.  [name], RN, MSN [facility] Care Management Care Management Coordinator Direct Line [phone] Cell [phone] Toll Free: [phone]  Fax: [phone]

## 2018-03-03 ENCOUNTER — Other Ambulatory Visit: Payer: Self-pay

## 2018-03-03 NOTE — Patient Outreach (Signed)
[facility]) Care Management  03/03/2018  [name] [name] 1950/12/26 [id number]   EMMI-pneumonia RED ON EMMI ALERT Day #3 Date:02/26/2018 Red Alert Reason: Been to follow-up appointment? No  Fever or chills? Yes  Had diarrhea or felt sick to stomach? Yes   EMMI-pneumonia RED ON EMMI ALERT Day #4 Date:02/27/2018 Red Alert Reason: Wake up with shortness of breath when lying flat? Yes    Outreach attempt:no answer. States voice mail full.    Plan: RN CM willwait return call. If no return call will close case.  [name], RN, MSN [facility] Care Management Care Management Coordinator Direct Line [phone] Cell [phone] Toll Free: [phone]  Fax: [phone]

## 2018-03-11 ENCOUNTER — Other Ambulatory Visit: Payer: Self-pay | Admitting: Cardiovascular Disease

## 2018-03-13 ENCOUNTER — Other Ambulatory Visit: Payer: Self-pay

## 2018-03-13 NOTE — Patient Outreach (Signed)
[facility]) Care Management  03/13/2018  [name] [name] 13-Sep-1950 [id number]   Multiple attempts to establish contact with patient without success. No response from letter mailed to patient.   Plan: RN CM will close case at this time.   [name], RN, MSN [facility] Care Management Care Management Coordinator Direct Line [phone] Cell [phone] Toll Free: [phone]  Fax: [phone]

## 2018-03-24 ENCOUNTER — Ambulatory Visit: Payer: Medicare Other | Admitting: Podiatry

## 2018-04-13 ENCOUNTER — Other Ambulatory Visit: Payer: Self-pay | Admitting: Cardiovascular Disease

## 2018-04-15 ENCOUNTER — Other Ambulatory Visit: Payer: Self-pay | Admitting: Cardiovascular Disease

## 2018-05-08 ENCOUNTER — Encounter (HOSPITAL_COMMUNITY): Payer: Self-pay | Admitting: *Deleted

## 2018-05-08 ENCOUNTER — Other Ambulatory Visit: Payer: Self-pay

## 2018-05-08 ENCOUNTER — Emergency Department (HOSPITAL_COMMUNITY): Payer: Medicare Other

## 2018-05-08 ENCOUNTER — Inpatient Hospital Stay (HOSPITAL_COMMUNITY)
Admission: EM | Admit: 2018-05-08 | Discharge: 2018-05-13 | DRG: 280 | Disposition: A | Discharge status: Home / Self Care | Payer: Medicare Other | Attending: Internal Medicine | Admitting: Internal Medicine

## 2018-05-08 DIAGNOSIS — I214 Non-ST elevation (NSTEMI) myocardial infarction: Secondary | ICD-10-CM

## 2018-05-08 DIAGNOSIS — I252 Old myocardial infarction: Secondary | ICD-10-CM

## 2018-05-08 DIAGNOSIS — R7989 Other specified abnormal findings of blood chemistry: Secondary | ICD-10-CM

## 2018-05-08 DIAGNOSIS — E669 Obesity, unspecified: Secondary | ICD-10-CM | POA: Diagnosis present

## 2018-05-08 DIAGNOSIS — N179 Acute kidney failure, unspecified: Secondary | ICD-10-CM | POA: Diagnosis not present

## 2018-05-08 DIAGNOSIS — J9601 Acute respiratory failure with hypoxia: Secondary | ICD-10-CM | POA: Diagnosis not present

## 2018-05-08 DIAGNOSIS — N183 Chronic kidney disease, stage 3 (moderate): Secondary | ICD-10-CM | POA: Diagnosis present

## 2018-05-08 DIAGNOSIS — IMO0002 Reserved for concepts with insufficient information to code with codable children: Secondary | ICD-10-CM | POA: Diagnosis present

## 2018-05-08 DIAGNOSIS — E1151 Type 2 diabetes mellitus with diabetic peripheral angiopathy without gangrene: Secondary | ICD-10-CM | POA: Diagnosis present

## 2018-05-08 DIAGNOSIS — Z91040 Latex allergy status: Secondary | ICD-10-CM

## 2018-05-08 DIAGNOSIS — E782 Mixed hyperlipidemia: Secondary | ICD-10-CM | POA: Diagnosis not present

## 2018-05-08 DIAGNOSIS — N1832 Chronic kidney disease, stage 3b: Secondary | ICD-10-CM | POA: Diagnosis present

## 2018-05-08 DIAGNOSIS — I25110 Atherosclerotic heart disease of native coronary artery with unstable angina pectoris: Secondary | ICD-10-CM | POA: Diagnosis present

## 2018-05-08 DIAGNOSIS — E1165 Type 2 diabetes mellitus with hyperglycemia: Secondary | ICD-10-CM | POA: Diagnosis present

## 2018-05-08 DIAGNOSIS — Z9119 Patient's noncompliance with other medical treatment and regimen: Secondary | ICD-10-CM | POA: Diagnosis not present

## 2018-05-08 DIAGNOSIS — Z89411 Acquired absence of right great toe: Secondary | ICD-10-CM

## 2018-05-08 DIAGNOSIS — I6521 Occlusion and stenosis of right carotid artery: Secondary | ICD-10-CM | POA: Diagnosis present

## 2018-05-08 DIAGNOSIS — I509 Heart failure, unspecified: Secondary | ICD-10-CM

## 2018-05-08 DIAGNOSIS — I2584 Coronary atherosclerosis due to calcified coronary lesion: Secondary | ICD-10-CM | POA: Diagnosis present

## 2018-05-08 DIAGNOSIS — Z880 Allergy status to penicillin: Secondary | ICD-10-CM

## 2018-05-08 DIAGNOSIS — T82855A Stenosis of coronary artery stent, initial encounter: Secondary | ICD-10-CM | POA: Diagnosis present

## 2018-05-08 DIAGNOSIS — E1140 Type 2 diabetes mellitus with diabetic neuropathy, unspecified: Secondary | ICD-10-CM | POA: Diagnosis present

## 2018-05-08 DIAGNOSIS — I130 Hypertensive heart and chronic kidney disease with heart failure and stage 1 through stage 4 chronic kidney disease, or unspecified chronic kidney disease: Secondary | ICD-10-CM | POA: Diagnosis not present

## 2018-05-08 DIAGNOSIS — Z794 Long term (current) use of insulin: Secondary | ICD-10-CM

## 2018-05-08 DIAGNOSIS — I2510 Atherosclerotic heart disease of native coronary artery without angina pectoris: Secondary | ICD-10-CM | POA: Diagnosis not present

## 2018-05-08 DIAGNOSIS — R0602 Shortness of breath: Secondary | ICD-10-CM | POA: Diagnosis not present

## 2018-05-08 DIAGNOSIS — I272 Pulmonary hypertension, unspecified: Secondary | ICD-10-CM | POA: Diagnosis present

## 2018-05-08 DIAGNOSIS — E1122 Type 2 diabetes mellitus with diabetic chronic kidney disease: Secondary | ICD-10-CM | POA: Diagnosis present

## 2018-05-08 DIAGNOSIS — E875 Hyperkalemia: Secondary | ICD-10-CM | POA: Diagnosis present

## 2018-05-08 DIAGNOSIS — D72829 Elevated white blood cell count, unspecified: Secondary | ICD-10-CM | POA: Diagnosis present

## 2018-05-08 DIAGNOSIS — Z823 Family history of stroke: Secondary | ICD-10-CM

## 2018-05-08 DIAGNOSIS — T501X5A Adverse effect of loop [high-ceiling] diuretics, initial encounter: Secondary | ICD-10-CM | POA: Diagnosis not present

## 2018-05-08 DIAGNOSIS — K219 Gastro-esophageal reflux disease without esophagitis: Secondary | ICD-10-CM | POA: Diagnosis present

## 2018-05-08 DIAGNOSIS — I110 Hypertensive heart disease with heart failure: Secondary | ICD-10-CM | POA: Diagnosis not present

## 2018-05-08 DIAGNOSIS — Y831 Surgical operation with implant of artificial internal device as the cause of abnormal reaction of the patient, or of later complication, without mention of misadventure at the time of the procedure: Secondary | ICD-10-CM | POA: Diagnosis present

## 2018-05-08 DIAGNOSIS — T463X5A Adverse effect of coronary vasodilators, initial encounter: Secondary | ICD-10-CM | POA: Diagnosis not present

## 2018-05-08 DIAGNOSIS — I5043 Acute on chronic combined systolic (congestive) and diastolic (congestive) heart failure: Secondary | ICD-10-CM | POA: Diagnosis not present

## 2018-05-08 DIAGNOSIS — Z961 Presence of intraocular lens: Secondary | ICD-10-CM | POA: Diagnosis present

## 2018-05-08 DIAGNOSIS — R778 Other specified abnormalities of plasma proteins: Secondary | ICD-10-CM

## 2018-05-08 DIAGNOSIS — R4701 Aphasia: Secondary | ICD-10-CM | POA: Diagnosis present

## 2018-05-08 DIAGNOSIS — J449 Chronic obstructive pulmonary disease, unspecified: Secondary | ICD-10-CM | POA: Diagnosis present

## 2018-05-08 DIAGNOSIS — Z79899 Other long term (current) drug therapy: Secondary | ICD-10-CM

## 2018-05-08 DIAGNOSIS — Z8673 Personal history of transient ischemic attack (TIA), and cerebral infarction without residual deficits: Secondary | ICD-10-CM

## 2018-05-08 DIAGNOSIS — Z888 Allergy status to other drugs, medicaments and biological substances status: Secondary | ICD-10-CM

## 2018-05-08 DIAGNOSIS — E119 Type 2 diabetes mellitus without complications: Secondary | ICD-10-CM

## 2018-05-08 DIAGNOSIS — Z89421 Acquired absence of other right toe(s): Secondary | ICD-10-CM

## 2018-05-08 DIAGNOSIS — I959 Hypotension, unspecified: Secondary | ICD-10-CM | POA: Diagnosis not present

## 2018-05-08 DIAGNOSIS — I160 Hypertensive urgency: Secondary | ICD-10-CM | POA: Diagnosis present

## 2018-05-08 DIAGNOSIS — Z833 Family history of diabetes mellitus: Secondary | ICD-10-CM

## 2018-05-08 DIAGNOSIS — Z8249 Family history of ischemic heart disease and other diseases of the circulatory system: Secondary | ICD-10-CM

## 2018-05-08 DIAGNOSIS — E785 Hyperlipidemia, unspecified: Secondary | ICD-10-CM | POA: Diagnosis present

## 2018-05-08 DIAGNOSIS — Z9841 Cataract extraction status, right eye: Secondary | ICD-10-CM

## 2018-05-08 DIAGNOSIS — I10 Essential (primary) hypertension: Secondary | ICD-10-CM | POA: Diagnosis not present

## 2018-05-08 DIAGNOSIS — Z87891 Personal history of nicotine dependence: Secondary | ICD-10-CM

## 2018-05-08 DIAGNOSIS — Z7902 Long term (current) use of antithrombotics/antiplatelets: Secondary | ICD-10-CM

## 2018-05-08 DIAGNOSIS — Z6831 Body mass index (BMI) 31.0-31.9, adult: Secondary | ICD-10-CM

## 2018-05-08 DIAGNOSIS — Z7982 Long term (current) use of aspirin: Secondary | ICD-10-CM

## 2018-05-08 DIAGNOSIS — Z881 Allergy status to other antibiotic agents status: Secondary | ICD-10-CM

## 2018-05-08 DIAGNOSIS — Z90710 Acquired absence of both cervix and uterus: Secondary | ICD-10-CM

## 2018-05-08 DIAGNOSIS — M1990 Unspecified osteoarthritis, unspecified site: Secondary | ICD-10-CM | POA: Diagnosis present

## 2018-05-08 DIAGNOSIS — Z95820 Peripheral vascular angioplasty status with implants and grafts: Secondary | ICD-10-CM

## 2018-05-08 DIAGNOSIS — R000 Tachycardia, unspecified: Secondary | ICD-10-CM | POA: Diagnosis not present

## 2018-05-08 LAB — BASIC METABOLIC PANEL
Anion gap: 15 (ref 5–15)
BUN: 29 mg/dL — ABNORMAL HIGH (ref 8–23)
CO2: 18 mmol/L — ABNORMAL LOW (ref 22–32)
Calcium: 9.3 mg/dL (ref 8.9–10.3)
Chloride: 100 mmol/L (ref 98–111)
Creatinine, Ser: 1.48 mg/dL — ABNORMAL HIGH (ref 0.44–1.00)
GFR calc Af Amer: 42 mL/min — ABNORMAL LOW (ref >60)
GFR calc non Af Amer: 36 mL/min — ABNORMAL LOW (ref >60)
Glucose, Bld: 385 mg/dL — ABNORMAL HIGH (ref 70–99)
Potassium: 5.3 mmol/L — ABNORMAL HIGH (ref 3.5–5.1)
Sodium: 133 mmol/L — ABNORMAL LOW (ref 135–145)

## 2018-05-08 LAB — GLUCOSE, CAPILLARY
Glucose-Capillary: 282 mg/dL — ABNORMAL HIGH (ref 70–99)
Glucose-Capillary: 226 mg/dL — ABNORMAL HIGH (ref 70–99)
Glucose-Capillary: 194 mg/dL — ABNORMAL HIGH (ref 70–99)

## 2018-05-08 LAB — CBC WITH DIFFERENTIAL/PLATELET
Abs Immature Granulocytes: 0.04 10*3/uL (ref 0.00–0.07)
Basophils Absolute: 0 10*3/uL (ref 0.0–0.1)
Basophils Relative: 0 %
Eosinophils Absolute: 0 10*3/uL (ref 0.0–0.5)
Eosinophils Relative: 0 %
HCT: 35.3 % — ABNORMAL LOW (ref 36.0–46.0)
Hemoglobin: 12 g/dL (ref 12.0–15.0)
Immature Granulocytes: 0 %
Lymphocytes Relative: 9 %
Lymphs Abs: 1.1 10*3/uL (ref 0.7–4.0)
MCH: 28 pg (ref 26.0–34.0)
MCHC: 34 g/dL (ref 30.0–36.0)
MCV: 82.5 fL (ref 80.0–100.0)
Monocytes Absolute: 0.4 10*3/uL (ref 0.1–1.0)
Monocytes Relative: 3 %
Neutro Abs: 10.6 10*3/uL — ABNORMAL HIGH (ref 1.7–7.7)
Neutrophils Relative %: 88 %
Platelets: 261 10*3/uL (ref 150–400)
RBC: 4.28 MIL/uL (ref 3.87–5.11)
RDW: 13.8 % (ref 11.5–15.5)
WBC: 12.2 10*3/uL — ABNORMAL HIGH (ref 4.0–10.5)
nRBC: 0 % (ref 0.0–0.2)

## 2018-05-08 LAB — TROPONIN I
Troponin I: 0.09 ng/mL (ref <0.03)
Troponin I: 1.31 ng/mL (ref <0.03)
Troponin I: 9.92 ng/mL (ref <0.03)
Troponin I: 7.63 ng/mL (ref <0.03)

## 2018-05-08 LAB — CREATININE, SERUM
Creatinine, Ser: 1.3 mg/dL — ABNORMAL HIGH (ref 0.44–1.00)
GFR calc Af Amer: 49 mL/min — ABNORMAL LOW (ref >60)
GFR calc non Af Amer: 42 mL/min — ABNORMAL LOW (ref >60)

## 2018-05-08 LAB — BRAIN NATRIURETIC PEPTIDE: B Natriuretic Peptide: 624.8 pg/mL — ABNORMAL HIGH (ref 0.0–100.0)

## 2018-05-08 LAB — MRSA PCR SCREENING: MRSA by PCR: NEGATIVE

## 2018-05-08 MED ORDER — GABAPENTIN 300 MG PO CAPS
300.0000 mg | ORAL_CAPSULE | Freq: Three times a day (TID) | ORAL | Status: DC
  Administered 2018-05-08 – 2018-05-10 (×7): 300 mg via ORAL
  Filled 2018-05-08 (×6): qty 1

## 2018-05-08 MED ORDER — CLOPIDOGREL BISULFATE 75 MG PO TABS
75.0000 mg | ORAL_TABLET | Freq: Every day | ORAL | Status: DC
  Administered 2018-05-08 – 2018-05-09 (×2): 75 mg via ORAL
  Filled 2018-05-08 (×2): qty 1

## 2018-05-08 MED ORDER — INSULIN ASPART 100 UNIT/ML ~~LOC~~ SOLN
0.0000 [IU] | Freq: Every day | SUBCUTANEOUS | Status: DC
  Administered 2018-05-10: 22:00:00 4 [IU] via SUBCUTANEOUS
  Administered 2018-05-11 – 2018-05-12 (×2): 2 [IU] via SUBCUTANEOUS

## 2018-05-08 MED ORDER — PANTOPRAZOLE SODIUM 40 MG PO TBEC
40.0000 mg | DELAYED_RELEASE_TABLET | Freq: Every day | ORAL | Status: DC
  Administered 2018-05-08 – 2018-05-13 (×6): 40 mg via ORAL
  Filled 2018-05-08 (×6): qty 1

## 2018-05-08 MED ORDER — INSULIN ASPART 100 UNIT/ML ~~LOC~~ SOLN
0.0000 [IU] | Freq: Three times a day (TID) | SUBCUTANEOUS | Status: DC
  Administered 2018-05-08: 5 [IU] via SUBCUTANEOUS
  Administered 2018-05-08 – 2018-05-09 (×2): 3 [IU] via SUBCUTANEOUS
  Administered 2018-05-09: 1 [IU] via SUBCUTANEOUS
  Administered 2018-05-09: 13:00:00 3 [IU] via SUBCUTANEOUS
  Administered 2018-05-10: 13:00:00 2 [IU] via SUBCUTANEOUS
  Administered 2018-05-10 – 2018-05-11 (×2): 5 [IU] via SUBCUTANEOUS
  Administered 2018-05-12 (×2): 2 [IU] via SUBCUTANEOUS

## 2018-05-08 MED ORDER — ISOSORBIDE MONONITRATE ER 60 MG PO TB24
90.0000 mg | ORAL_TABLET | Freq: Every day | ORAL | Status: DC
  Administered 2018-05-08 – 2018-05-09 (×2): 90 mg via ORAL
  Filled 2018-05-08 (×2): qty 1

## 2018-05-08 MED ORDER — ONDANSETRON HCL 4 MG/2ML IJ SOLN: 4.0000 mg | Freq: Four times a day (QID) | INTRAMUSCULAR | Status: DC | PRN

## 2018-05-08 MED ORDER — SODIUM CHLORIDE 0.9% FLUSH: 3.0000 mL | INTRAVENOUS | Status: DC | PRN

## 2018-05-08 MED ORDER — SODIUM CHLORIDE 0.9 % IV SOLN: 250.0000 mL | INTRAVENOUS | Status: DC | PRN

## 2018-05-08 MED ORDER — NITROGLYCERIN IN D5W 200-5 MCG/ML-% IV SOLN
0.0000 ug/min | INTRAVENOUS | Status: DC
  Administered 2018-05-08: 5 ug/min via INTRAVENOUS
  Filled 2018-05-08: qty 250

## 2018-05-08 MED ORDER — HEPARIN (PORCINE) 25000 UT/250ML-% IV SOLN
1100.0000 [IU]/h | INTRAVENOUS | Status: DC
  Administered 2018-05-08 – 2018-05-09 (×2): 900 [IU]/h via INTRAVENOUS
  Administered 2018-05-10: 1100 [IU]/h via INTRAVENOUS
  Filled 2018-05-08 (×3): qty 250

## 2018-05-08 MED ORDER — FUROSEMIDE 10 MG/ML IJ SOLN
40.0000 mg | Freq: Once | INTRAMUSCULAR | Status: AC
  Administered 2018-05-08: 09:00:00 40 mg via INTRAVENOUS
  Filled 2018-05-08: qty 4

## 2018-05-08 MED ORDER — FUROSEMIDE 10 MG/ML IJ SOLN
40.0000 mg | Freq: Once | INTRAMUSCULAR | Status: AC
  Administered 2018-05-08: 08:00:00 40 mg via INTRAVENOUS
  Filled 2018-05-08: qty 4

## 2018-05-08 MED ORDER — HEPARIN SODIUM (PORCINE) 5000 UNIT/ML IJ SOLN
5000.0000 [IU] | Freq: Three times a day (TID) | INTRAMUSCULAR | Status: DC
  Filled 2018-05-08: qty 1

## 2018-05-08 MED ORDER — INSULIN GLARGINE 100 UNIT/ML ~~LOC~~ SOLN
35.0000 [IU] | Freq: Every day | SUBCUTANEOUS | Status: DC
  Administered 2018-05-08 – 2018-05-10 (×3): 35 [IU] via SUBCUTANEOUS
  Filled 2018-05-08 (×5): qty 0.35

## 2018-05-08 MED ORDER — HEPARIN BOLUS VIA INFUSION
4000.0000 [IU] | Freq: Once | INTRAVENOUS | Status: AC
  Administered 2018-05-08: 4000 [IU] via INTRAVENOUS
  Filled 2018-05-08: qty 4000

## 2018-05-08 MED ORDER — FUROSEMIDE 10 MG/ML IJ SOLN
40.0000 mg | Freq: Two times a day (BID) | INTRAMUSCULAR | Status: DC
  Administered 2018-05-08: 40 mg via INTRAVENOUS
  Filled 2018-05-08: qty 4

## 2018-05-08 MED ORDER — METOPROLOL SUCCINATE ER 50 MG PO TB24
75.0000 mg | ORAL_TABLET | Freq: Every day | ORAL | Status: DC
  Administered 2018-05-08 – 2018-05-10 (×3): 75 mg via ORAL
  Filled 2018-05-08 (×3): qty 1

## 2018-05-08 MED ORDER — ATORVASTATIN CALCIUM 80 MG PO TABS
80.0000 mg | ORAL_TABLET | Freq: Every day | ORAL | Status: DC
  Administered 2018-05-08 – 2018-05-12 (×5): 80 mg via ORAL
  Filled 2018-05-08 (×5): qty 1

## 2018-05-08 MED ORDER — SODIUM CHLORIDE 0.9% FLUSH
3.0000 mL | Freq: Two times a day (BID) | INTRAVENOUS | Status: DC
  Administered 2018-05-08 – 2018-05-10 (×5): 3 mL via INTRAVENOUS

## 2018-05-08 MED ORDER — ACETAMINOPHEN 650 MG RE SUPP: 650.0000 mg | Freq: Four times a day (QID) | RECTAL | Status: DC | PRN

## 2018-05-08 MED ORDER — POTASSIUM CHLORIDE CRYS ER 20 MEQ PO TBCR
40.0000 meq | EXTENDED_RELEASE_TABLET | Freq: Once | ORAL | Status: AC
  Administered 2018-05-08: 40 meq via ORAL
  Filled 2018-05-08: qty 2

## 2018-05-08 MED ORDER — ASPIRIN EC 81 MG PO TBEC
81.0000 mg | DELAYED_RELEASE_TABLET | Freq: Every day | ORAL | Status: DC
  Administered 2018-05-09 – 2018-05-13 (×4): 81 mg via ORAL
  Filled 2018-05-08 (×5): qty 1

## 2018-05-08 MED ORDER — LOSARTAN POTASSIUM 50 MG PO TABS
100.0000 mg | ORAL_TABLET | Freq: Every day | ORAL | Status: DC
  Administered 2018-05-08 – 2018-05-09 (×2): 100 mg via ORAL
  Filled 2018-05-08 (×3): qty 2

## 2018-05-08 MED ORDER — ACETAMINOPHEN 325 MG PO TABS
650.0000 mg | ORAL_TABLET | Freq: Four times a day (QID) | ORAL | Status: DC | PRN
  Administered 2018-05-09: 13:00:00 650 mg via ORAL
  Filled 2018-05-08: qty 2

## 2018-05-08 MED ORDER — OXYCODONE HCL 5 MG PO TABS: 5.0000 mg | ORAL_TABLET | ORAL | Status: DC | PRN

## 2018-05-08 MED ORDER — ONDANSETRON HCL 4 MG PO TABS: 4.0000 mg | ORAL_TABLET | Freq: Four times a day (QID) | ORAL | Status: DC | PRN

## 2018-05-08 MED ORDER — FUROSEMIDE 10 MG/ML IJ SOLN: 20.0000 mg | Freq: Two times a day (BID) | INTRAMUSCULAR | Status: DC

## 2018-05-08 MED ORDER — ASPIRIN 81 MG PO CHEW
324.0000 mg | CHEWABLE_TABLET | Freq: Once | ORAL | Status: AC
  Administered 2018-05-08: 09:00:00 324 mg via ORAL
  Filled 2018-05-08: qty 4

## 2018-05-08 NOTE — ED Notes (Signed)
Pt. initial sats 90%. Pt. placed on 2L [place] with sats increasing to 97%.

## 2018-05-08 NOTE — ED Notes (Signed)
Called Daughter and updated patient status, aware of patient room number and Nurse.

## 2018-05-08 NOTE — H&P (Signed)
History and Physical    [name] [name] WUJ:[id number] DOB: 06/15/1950 DOA: 05/08/2018  PCP: Georgann Housekeeper, MD  Patient coming from: Home  Chief Complaint: Shortness of breath   HPI: [name] [name] is a 68 y.o. female with medical history significant of chronic combined systolic and diastolic heart failure, hypertension, CAD, hyperlipidemia, chronic kidney disease stage III, type 2 diabetes who presents with shortness of breath that started at 130 this morning.  It woke her up out of sleep.  She does admit that she missed couple doses of her Lasix at home.  She admits to headache, shortness of breath and peripheral edema.  She denies any fevers or chills at home, no chest pain.  She did have 2 episodes of vomiting prior to admission, no abdominal pain or diarrhea.  ED Course: Patient was tachycardic with rate in the 110s, hypertensive with SBP in the 170s.  She did require 2 L of nasal cannula in the emergency department.  BiPAP was ordered, however not started at the time of my examination yet.  Labs revealed potassium 5.3, creatinine 1.48, glucose 385, BNP 624, troponin 0 0.09.  Chest x-ray revealed cardiomegaly with interstitial edema.  Patient was given 2 doses of IV Lasix and started on nitro drip.  Review of Systems: As per HPI otherwise 10 point review of systems negative.   Past Medical History:  Diagnosis Date   Anemia 10/2015   Acute Blood Loss   Arthritis    "feel like I have it all over" (08/28/2015)   CHF (congestive heart failure) (HCC)    Complication of anesthesia    DIFFICULT WAKING "only when I was smoking; no problems since I quit"   Coronary artery disease    Family history of adverse reaction to anesthesia    sister slow to wake up   GERD (gastroesophageal reflux disease)    takes Protonix daily    Hip bursitis    History of blood transfusion    10/2015   Hyperlipidemia LDL goal < 70 06/28/2013   takes Atorvastatin daily   Hypertension    takes Metoprolol  and Imdur daily   Hypoxia 01/2018   Malnutrition (HCC)    Migraine    "none in a long time" (08/28/2015)   Myocardial infarction (HCC) 2011   Neuromuscular disorder (HCC)    DIABETIC NEUROPATHY   Osteomyelitis (HCC) 2017   Left foot   PAD (peripheral artery disease) (HCC)    Peripheral vascular disease (HCC)    Respiratory failure (HCC) 10/2015   Acute Hypoxia- acute pulmonary edema 11/13/2015   Septic shock (HCC) 10/2015   Stroke (HCC)    Type II diabetes mellitus (HCC)    takes Lantus nightly.Average fasting blood sugar runs 80-90  Type II    Past Surgical History:  Procedure Laterality Date   ABDOMINAL HYSTERECTOMY     AMPUTATION Right 10/22/2016   Procedure: AMPUTATION DIGIT RIGHT TOES 1-3 POSSIBLE TRANSMETATARSAL;  Surgeon: Chuck Hint, MD;  [facility] OR;  Service: Vascular;  Laterality: Right;   APPENDECTOMY     ATHERECTOMY N/A 06/04/2011   Procedure: ATHERECTOMY;  Surgeon: Runell Gess, MD;  Location: [facility] CATH LAB;  Service: Cardiovascular;  Laterality: N/A;   CARDIAC CATHETERIZATION  10/13/2009   95% stenosis in the AV groove circumflex and 95% ostial stenosis in small OM3. A 3x42mm drug-eluting Promus stent inserted ito the circumflex. Dilatated with a 3.25x34mm noncompliant Quantum balloon within entire segment. The entire region was reduced to 0% and  brisk TIMI3 flow.   CAROTID DUPLEX  03/19/2011   Right ICA-demonstrates complete occlusion. Left ICA-demonstrates a small amount of fibrous plaque.   CATARACT EXTRACTION W/ INTRAOCULAR LENS IMPLANT Right    CESAREAN SECTION  1990   CORONARY ANGIOPLASTY     ENDARTERECTOMY FEMORAL Left 09/05/2015   Procedure: ENDARTERECTOMY FEMORAL WITH PROFUNDOPLASTY;  Surgeon: Chuck Hinthristopher S Dickson, MD;  [facility];  Service: Vascular;  Laterality: Left;  Left common femoral artery vein patch using left saphenous vien   ENDARTERECTOMY FEMORAL Right 10/22/2016   Procedure: ENDARTERECTOMY RIGHT COMMON  FEMORAL;  Surgeon: Chuck Hintickson, Christopher S, MD;  [facility];  Service: Vascular;  Laterality: Right;   FEMORAL-POPLITEAL BYPASS GRAFT Left 11/02/2015   Procedure: BYPASS GRAFT FEMORAL-POPLITEAL ARTERY VS FEMORAL-TIBIAL ARTERY BYPASS;  Surgeon: Chuck Hinthristopher S Dickson, MD;  [facility];  Service: Vascular;  Laterality: Left;   FEMORAL-POPLITEAL BYPASS GRAFT Right 10/22/2016   Procedure: BYPASS GRAFT RIGHT FEMORAL- BELOW KNEE POPLITEAL ARTERY WITH VEIN;  Surgeon: Chuck Hintickson, Christopher S, MD;  [facility];  Service: Vascular;  Laterality: Right;   I&D EXTREMITY Left 11/10/2015   Procedure: Debridement Left Foot Ulcer, Application  Wound VAC;  Surgeon: Nadara MustardMarcus V Duda, MD;  [facility]: [facility] OR;  Service: Orthopedics;  Laterality: Left;   ILIAC ARTERY STENT Left 08/28/2015   common   INTRAOPERATIVE ARTERIOGRAM Left 09/05/2015   Procedure: INTRA OPERATIVE ARTERIOGRAM;  Surgeon: Chuck Hinthristopher S Dickson, MD;  [facility];  Service: Vascular;  Laterality: Left;   INTRAOPERATIVE ARTERIOGRAM Left 11/02/2015   Procedure: INTRA OPERATIVE ARTERIOGRAM;  Surgeon: Chuck Hinthristopher S Dickson, MD;  [facility];  Service: Vascular;  Laterality: Left;   INTRAOPERATIVE ARTERIOGRAM Right 10/22/2016   Procedure: INTRA OPERATIVE ARTERIOGRAM;  Surgeon: Chuck Hintickson, Christopher S, MD;  [facility];  Service: Vascular;  Laterality: Right;   LEXISCAN MYOVIEW  10/25/2010   Moderate perfusion defect due to infarct/scar with mild perinfarct ischemia seen in the Basal Inferolateral, Basal Anterolateral, Mid Inferolateral, and Mid Anterolateral regions. Post-stress EF is 50%.   LOWER EXTREMITY ANGIOGRAPHY N/A 10/17/2016   Procedure: Lower Extremity Angiography;  Surgeon: Runell GessBerry, Jonathan J, MD;  [facility];  Service: Cardiovascular;  Laterality: N/A;   OVARY SURGERY  1983?   "ruptured"   PERIPHERAL VASCULAR ANGIOGRAM  01/26/2010   High-grade SFA disease: left greater than right. Left SFA would require  fem-pop bypass grafting. Right SFA could be stented but might require Diamondback Orbital atherectomy.   PERIPHERAL VASCULAR ANGIOGRAM  02/23/2010   Stealth Predator orbital rotational atherectomy performed on SFA & Popliteal up to 90,000 RPM. Stenting using overlapping 5x16600mm and 5x160mm Absolute Pro Nitinol self-expanding stents beginning just at the knee up to the mid SFA resulting in reduction of 90-95% calcified SFA & Popliteal stenosis to 0. Stenting performed on the distal common & proximal iliac artery with a 10x4 Absolute Pro- 70-0%.   PERIPHERAL VASCULAR ANGIOGRAM  06/17/2010   PTA performed to the right external iliac artery stent using a 5x100 balloon at 10 atmospheres. Stenting performed using a 6x18 Genesis on Opta balloon. Postdilatation with a 7x2 balloon resulting in a 95% "in-stent" stenosis to 0% residual.   PERIPHERAL VASCULAR ANGIOGRAM  06/04/2011   Bilateral total SFAs not percutaneously addressable. Good canidate for femoropopliteal bypass grafting   PERIPHERAL VASCULAR ANGIOGRAM  08/28/2015   PERIPHERAL VASCULAR CATHETERIZATION N/A 08/28/2015   Procedure: Lower Extremity Angiography;  Surgeon: Runell GessJonathan J Berry, MD;  [facility];  Service: Cardiovascular;  Laterality:  N/A;   PERIPHERAL VASCULAR CATHETERIZATION N/A 08/28/2015   Procedure: Abdominal Aortogram;  Surgeon: Runell Gess, MD;  [facility]: [facility] INVAS[facility] CV LAB;  Service: Cardiovascular;  Laterality: N/A;   PERIPHERAL VASCULAR CATHETERIZATION Left 08/28/2015   Procedure: Peripheral Vascular Intervention;  Surgeon: Runell Gess, MD;  Location: [facility] INVASIVE CV LAB;  Service: Cardiovascular;  Laterality: Left;  common iliac   PERIPHERAL VASCULAR CATHETERIZATION Left 08/28/2015   Procedure: Peripheral Vascular Atherectomy;  Surgeon: Runell Gess, MD;  [facility]: [facility] INVASIVE CV LAB;  Service: Cardiovascular;  Laterality: Left;  common iliac   PERIPHERAL VASCULAR CATHETERIZATION N/A 09/28/2015    Procedure: Lower Extremity Angiography;  Surgeon: Runell Gess, MD;  Location: [facility] INVASIVE CV LAB;  Service: Cardiovascular;  Laterality: N/A;   PERIPHERAL VASCULAR CATHETERIZATION Left 09/28/2015   Procedure: Peripheral Vascular Intervention;  Surgeon: Runell Gess, MD;  Location: [facility] INVASIVE CV LAB;  Service: Cardiovascular;  Laterality: Left CFA  PCI with 9 mm x 4 cm Abbott nitinol absolute Pro self-expanding stent      SKIN SPLIT GRAFT Left 12/01/2015   Procedure: LEFT FOOT SKIN GRAFT AND VAC;  Surgeon: [name], MD;  [facility] OR;  Service: Orthopedics;  Laterality: Left;   THROMBECTOMY FEMORAL ARTERY Right 10/22/2016   Procedure: THROMBECTOMY FEMORAL ARTERY;  Surgeon: Chuck Hint, MD;  [facility];  Service: Vascular;  Laterality: Right;   TRANSTHORACIC ECHOCARDIOGRAM  10/17/2009   EF 45-50%, moderate hypokinesis of the entire inferolateral myocardium, mild concentric hypertrophy and mild regurg of the mitral valva.   VEIN HARVEST Left 11/02/2015   Procedure: LEFT GREATER SAPHENOUS VEIN HARVEST;  Surgeon: Chuck Hint, MD;  Location: [facility] OR;  Service: Vascular;  Laterality: Left;     reports that she quit smoking about 8 years ago. Her smoking use included cigarettes. She has a 61.50 pack-year smoking history. She has never used smokeless tobacco. She reports that she does not drink alcohol or use drugs.  Allergies  Allergen Reactions   Doxycycline Other (See Comments)    lethargy   Hydrochlorothiazide Other (See Comments)    lethargic    Latex Rash   Penicillins Swelling and Rash    Pt states she has tolerated Keflex in the past without problems. States she may have tolerated Augmentin in the past but it caused GI upset. Has patient had a PCN reaction causing immediate rash, facial/tongue/throat swelling, SOB or lightheadedness with hypotension: Yes Has patient had a PCN reaction causing severe rash involving mucus membranes or skin necrosis:  No Has patient had a PCN reaction that required hospitalization No Has patient had a PCN reaction occurring within the last 10 years: No    Family History  Problem Relation Age of Onset   Hypertension Mother    Heart failure Mother    Heart failure Father    Stroke Father    Diabetes Father     Prior to Admission medications   Medication Sig Start Date End Date Taking? Authorizing Provider  acetaminophen (TYLENOL) 325 MG tablet Take 650 mg by mouth every 6 (six) hours as needed for mild pain, fever or headache.   Yes [provider]  aspirin EC 81 MG tablet Take 81 mg by mouth daily.   Yes [provider]  atorvastatin (LIPITOR) 80 MG tablet Take 80 mg by mouth. Take one tablet daily for hyperlipidemia   Yes [provider]  clopidogrel (PLAVIX) 75 MG tablet Take 75 mg by mouth. Take one  tablet daily for PVD   Yes [provider]  furosemide (LASIX) 40 MG tablet Take 1 tablet (40 mg total) by mouth 2 (two) times daily. 02/21/18  Yes Narda Bonds, MD  gabapentin (NEURONTIN) 300 MG capsule Take 300 mg by mouth 3 (three) times daily.  10/10/15  Yes [provider]  isosorbide mononitrate (IMDUR) 30 MG 24 hr tablet Take 90 mg by mouth daily. Take 1 tablet along by mouth with 60 mg to equal 90 mg daily.    Yes [provider]  isosorbide mononitrate (IMDUR) 60 MG 24 hr tablet Take 90 mg by mouth daily. Take 1 tablet by mouth along with 30 mg to equal 90 mg daily   Yes [provider]  LEVEMIR FLEXTOUCH 100 UNIT/ML Pen Inject 35 Units into the skin at bedtime.  03/15/16  Yes [provider]  losartan (COZAAR) 100 MG tablet TAKE 1 TABLET BY MOUTH EVERY DAY Patient taking differently: Take 100 mg by mouth daily.  06/17/16  Yes Lennette Bihari, MD  metFORMIN (GLUCOPHAGE) 1000 MG tablet Take 1,000 mg by mouth 2 (two) times daily with a meal.   Yes [provider]  metoprolol succinate (TOPROL-XL) 50 MG 24 hr tablet  TAKE 1 AND 1/2 TABLETS BY MOUTH DAILY 04/15/18  Yes Lennette Bihari, MD  pantoprazole (PROTONIX) 40 MG tablet TAKE 1 TABLET BY MOUTH DAILY 04/13/18  Yes Lennette Bihari, MD  potassium chloride (K-DUR,KLOR-CON) 10 MEQ tablet Take 10 mEq by mouth daily.  05/31/16  Yes [provider]  VENTOLIN HFA 108 (90 Base) MCG/ACT inhaler Inhale 1 puff into the lungs every 4 (four) hours as needed for shortness of breath. 03/02/18  Yes [provider]    Physical Exam: Vitals:   05/08/18 0716 05/08/18 0730 05/08/18 0800 05/08/18 0830  BP:  (!) 174/96 (!) 193/108 (!) 174/92  Pulse:  (!) 117 (!) 122 (!) 108  Resp:  (!) 32 (!) 36 (!) 24  Temp:      TempSrc:      SpO2:  95% 94% 97%  Weight: 81.6 kg     Height: 5\' 4"  (1.626 m)        Constitutional: NAD, mildly anxious appearing Eyes: PERRL, lids and conjunctivae normal ENMT: Deferred, patient wearing a mask Neck: normal, supple, no masses, no thyromegaly Respiratory: Bilateral crackles, tachypneic with mild conversational dyspnea, on 2 L nasal cannula O2 Cardiovascular: Tachycardic, regular rhythm, +1 bilateral peripheral edema Abdomen: no tenderness, no masses palpated. No hepatosplenomegaly. Bowel sounds positive.  Musculoskeletal: no clubbing / cyanosis. Good ROM, no contractures. Normal muscle tone.  Right first 3 digits of toes status post amputation Skin: no rashes, lesions, ulcers on exposed skin Neurologic: Nonfocal, alert and oriented, speech clear Psychiatric: Normal judgment and insight. Normal mood.   Labs on Admission: I have personally reviewed following labs and imaging studies  CBC: No results for input(s): WBC, NEUTROABS, HGB, HCT, MCV, PLT in the last 168 hours. Basic Metabolic Panel: Recent Labs  Lab 05/08/18 0720  NA 133*  K 5.3*  CL 100  CO2 18*  GLUCOSE 385*  BUN 29*  CREATININE 1.48*  CALCIUM 9.3   GFR: Estimated Creatinine Clearance: 37.6 mL/min (A) (by C-G formula based on SCr of 1.48 mg/dL  (H)). Liver Function Tests: No results for input(s): AST, ALT, ALKPHOS, BILITOT, PROT, ALBUMIN in the last 168 hours. No results for input(s): LIPASE, AMYLASE in the last 168 hours. No results for input(s): AMMONIA in the  last 168 hours. Coagulation Profile: No results for input(s): INR, PROTIME in the last 168 hours. Cardiac Enzymes: Recent Labs  Lab 05/08/18 0720  TROPONINI 0.09*   BNP (last 3 results) No results for input(s): PROBNP in the last 8760 hours. HbA1C: No results for input(s): HGBA1C in the last 72 hours. CBG: No results for input(s): GLUCAP in the last 168 hours. Lipid Profile: No results for input(s): CHOL, HDL, LDLCALC, TRIG, CHOLHDL, LDLDIRECT in the last 72 hours. Thyroid Function Tests: No results for input(s): TSH, T4TOTAL, FREET4, T3FREE, THYROIDAB in the last 72 hours. Anemia Panel: No results for input(s): VITAMINB12, FOLATE, FERRITIN, TIBC, IRON, RETICCTPCT in the last 72 hours. Urine analysis:    Component Value Date/Time   COLORURINE YELLOW 09/05/2015 0641   APPEARANCEUR CLEAR 09/05/2015 0641   LABSPEC 1.013 09/05/2015 0641   PHURINE 6.0 09/05/2015 0641   GLUCOSEU 100 (A) 09/05/2015 0641   HGBUR TRACE (A) 09/05/2015 0641   BILIRUBINUR NEGATIVE 09/05/2015 0641   KETONESUR NEGATIVE 09/05/2015 0641   PROTEINUR 100 (A) 09/05/2015 0641   UROBILINOGEN 1.0 10/16/2009 0243   NITRITE NEGATIVE 09/05/2015 0641   LEUKOCYTESUR NEGATIVE 09/05/2015 0641   Sepsis Labs: !!!!!!!!!!!!!!!!!!!!!!!!!!!!!!!!!!!!!!!!!!!! @LABRCNTIP (procalcitonin:4,lacticidven:4) )No results found for this or any previous visit (from the past 240 hour(s)).   Radiological Exams on Admission: Dg Chest Portable 1 View  Result Date: 05/08/2018 CLINICAL DATA:  Shortness of breath. Nausea. Swelling of the lower extremities. EXAM: PORTABLE CHEST 1 VIEW COMPARISON:  02/19/2018 FINDINGS: Artifact overlies the chest. Chronic cardiomegaly and aortic atherosclerosis. Pulmonary venous  hypertension with interstitial edema. No visible effusion. IMPRESSION: Congestive heart failure with cardiomegaly and interstitial edema. Electronically Signed   By: Paulina Fusi M.D.   On: 05/08/2018 08:11   Chest x-ray reviewed independently, elevated right hemidiaphragm, interstitial edema  Assessment/Plan Active Problems:   Acute on chronic combined systolic and diastolic CHF (congestive heart failure) (HCC)  Acute hypoxemic respiratory failure -Currently on 2 L nasal cannula O2.  BiPAP was ordered in the emergency department   Acute on chronic systolic and diastolic heart failure -Echocardiogram in January 2020 revealed EF 45%, diffuse hypokinesis, grade 2 diastolic dysfunction -BNP 624.8 -Chest x-ray revealed cardiomegaly with interstitial edema -Lasix given in the emergency department, continue IV BID  -Strict I's and O's -Daily weight  Hypertensive urgency -Continue nitro drip and wean as able -Resume home Toprol, Cozaar, Imdur  Elevated troponin -Likely secondary to demand ischemia, patient has no chest pain -Trend troponin  CAD -Continue aspirin, Plavix  Hyperlipidemia -Continue Lipitor  CKD stage III -Baseline creatinine around 1.3 -Monitor creatinine closely in setting of diuresis  Type 2 diabetes with hyperglycemia -Continue Lantus, sliding scale insulin.  Hold metformin  GERD -Continue PPI    DVT prophylaxis: Subcutaneous heparin Code Status: Full code, confirmed with patient at time of admission Family Communication: Spoke with daughter over the phone Disposition Plan: Improvement in respiratory status, heart failure, blood pressure Consults called: None Admission status: Inpatient  Severity of Illness: The appropriate patient status for this patient is INPATIENT. Inpatient status is judged to be reasonable and necessary in order to provide the required intensity of service to ensure the patient's safety. The patient's presenting symptoms, physical  exam findings, and initial radiographic and laboratory data in the context of their chronic comorbidities is felt to place them at high risk for further clinical deterioration. Furthermore, it is not anticipated that the patient will be medically stable for discharge from the hospital within 2 midnights of admission. The  following factors support the patient status of inpatient.   " The patient's presenting symptoms include shortness of breath, headache, nausea and vomiting. " The worrisome physical exam findings include for peripheral edema, bilateral crackles, requiring 2 L nasal cannula O2. " The initial radiographic and laboratory data are worrisome because of chest x-ray with interstitial edema, elevated BNP. " The chronic co-morbidities include chronic combined systolic and diastolic heart failure, CKD stage III, diabetes, hypertension, hyperlipidemia, CAD.   * I certify that at the point of admission it is my clinical judgment that the patient will require inpatient hospital care spanning beyond 2 midnights from the point of admission due to high intensity of service, high risk for further deterioration and high frequency of surveillance required.Noralee Stain, DO [facility] 05/08/2018, 9:30 AM    How to contact the [facility] Attending or Consulting provider 7A - 7P or covering provider during after hours 7P -7A, for this patient?  1. Check the care team in [facility] and look for a) attending/consulting [facility] provider listed and b) the [facility] team listed 2. Log into [email or web address] and use [place]'s universal password to access. If you do not have the password, please contact the hospital operator. 3. Locate the [facility] provider you are looking for under [facility] and page to a number that you can be directly reached. 4. If you still have difficulty reaching the provider, please page the [facility] (Director on Call) for the Hospitalists listed on amion for assistance.

## 2018-05-08 NOTE — Consult Note (Addendum)
Cardiology Consultation:   Patient ID: CHARDAY CAPETILLO MRN: [id number]; DOB: 1950-04-25  Admit date: 05/08/2018 Date of Consult: 05/08/2018  Primary Care Provider: Georgann Housekeeper, MD Primary Cardiologist: Nicki Guadalajara, MD  Primary Electrophysiologist:  None    Patient Profile:   MEGA KINKADE is a 68 y.o. female with a hx of CAD and PVD as well as chronic combined systolic and diastolic HF, T2DM, HTN, HLD, CKD and prior h/o stroke, who is being seen today for the evaluation of NSTEMI and A/C combined systolic and diastolic CHF at the request of Dr. Alvino Chapel, Internal Medicine.   History of Present Illness:   Ms. Carrasco is followed by Dr. Tresa Endo and has known CAD and PVD as well as chronic combined systolic and diastolic HF, T2DM, HTN, HLD, CKD and prior h/o stroke.    In September 2011, she suffered an ST segment elevation myocardial infarction and underwent intervention to a totally occluded proximal left circumflex coronary artery which was successfully stented with a DES stent.  There was diffuse disease in her circumflex beyond the site of total occlusion and involving multiple bifurcation branches for which she underwent PTCA of the smaller vessels. Pt recalls having right arm pain with her MI in 2011.  She also has significant PVD and underwent angioplasty and stenting with diamondback rotational atherectomy for right popliteal and SFA in January 2012 and required repeat intervention in 2013 by Dr. Allyson Sabal. In October 2017, she was hospitalized and on 11/02/2015 had a left femoral to below-knee popliteal artery bypass by Dr. Durwin Nora. She has documented carotid stenosis with total right internal carotid occlusion and small amount of fibrous plaque in the left. She also has combined systolic and diastolic HF. Her most recent echo 02/13/18 showed mildly reduced LVEF at 45% with diffuse hypokinesis and G2DD. No significant valvular abnormalties. Pt was lost to f/u for a period of time. She has not seen Dr.  Tresa Endo in clinic since 2018.    Pt presented to the [facility] ED today w/ CC of SOB. Sudden onset ~1:30 AM waking her up from her sleep. Orthopnea/ PND. Also had associated right arm pain, which is her anginal equivalent. No anterior CP. She denies an associated fever, chills or cough. She has notice some peripheral edema and admits to poor compliance with home diuretics.   In the ED, pt found to be tachycardic w/ HR in the 110s and hypertensive w/ SBP in the 170s. Also hypoxic and placed on supplemental O2 via [place]. CXR showed cardiomegaly with interstitial edema and BNP elevated at 624. Initial troponin 0.09 but has further increased to 1.31>>9.92. EKG shows NSR with new lateral TW changes compared to prior EKGs. K 5.3 and SCr 1.48>>1.30. Afebrile but leukocytosis at 12.2. Pt was given IV Lasix in the ED for acute CHF and started on IV nitro for BP. BP has normalized. IV heparin initiated for NSTEMI. Pt now comfortable. No dyspnea on [place]. Denies active chest/ arm pain. Pt admitted by IM. Cardiology consulted to assist w/ NSTEMI and CHF .    Past Medical History:  Diagnosis Date   Anemia 10/2015   Acute Blood Loss   Arthritis    "feel like I have it all over" (08/28/2015)   CHF (congestive heart failure) (HCC)    Complication of anesthesia    DIFFICULT WAKING "only when I was smoking; no problems since I quit"   Coronary artery disease    Family history of adverse reaction to anesthesia    sister  slow to wake up   GERD (gastroesophageal reflux disease)    takes Protonix daily    Hip bursitis    History of blood transfusion    10/2015   Hyperlipidemia LDL goal < 70 06/28/2013   takes Atorvastatin daily   Hypertension    takes Metoprolol and Imdur daily   Hypoxia 01/2018   Malnutrition (HCC)    Migraine    "none in a long time" (08/28/2015)   Myocardial infarction [facility](HCC) 2011   Neuromuscular disorder (HCC)    DIABETIC NEUROPATHY   Osteomyelitis (HCC) 2017   Left foot   PAD  (peripheral artery disease) (HCC)    Peripheral vascular disease (HCC)    Respiratory failure (HCC) 10/2015   Acute Hypoxia- acute pulmonary edema 11/13/2015   Septic shock (HCC) 10/2015   Stroke (HCC)    Type II diabetes mellitus (HCC)    takes Lantus nightly.Average fasting blood sugar runs 80-90  Type II    Past Surgical History:  Procedure Laterality Date   ABDOMINAL HYSTERECTOMY     AMPUTATION Right 10/22/2016   Procedure: AMPUTATION DIGIT RIGHT TOES 1-3 POSSIBLE TRANSMETATARSAL;  Surgeon: Chuck Hintickson, Christopher S, MD;  [facility];  Service: Vascular;  Laterality: Right;   APPENDECTOMY     ATHERECTOMY N/A 06/04/2011   Procedure: ATHERECTOMY;  Surgeon: Runell GessJonathan J Berry, MD;  [facility];  Service: Cardiovascular;  Laterality: N/A;   CARDIAC CATHETERIZATION  10/13/2009   95% stenosis in the AV groove circumflex and 95% ostial stenosis in small OM3. A 3x9528mm drug-eluting Promus stent inserted ito the circumflex. Dilatated with a 3.25x2212mm noncompliant Quantum balloon within entire segment. The entire region was reduced to 0% and brisk TIMI3 flow.   CAROTID DUPLEX  03/19/2011   Right ICA-demonstrates complete occlusion. Left ICA-demonstrates a small amount of fibrous plaque.   CATARACT EXTRACTION W/ INTRAOCULAR LENS IMPLANT Right    CESAREAN SECTION  1990   CORONARY ANGIOPLASTY     ENDARTERECTOMY FEMORAL Left 09/05/2015   Procedure: ENDARTERECTOMY FEMORAL WITH PROFUNDOPLASTY;  Surgeon: Chuck Hinthristopher S Dickson, MD;  [facility];  Service: Vascular;  Laterality: Left;  Left common femoral artery vein patch using left saphenous vien   ENDARTERECTOMY FEMORAL Right 10/22/2016   Procedure: ENDARTERECTOMY RIGHT COMMON FEMORAL;  Surgeon: Chuck Hintickson, Christopher S, MD;  [facility];  Service: Vascular;  Laterality: Right;   FEMORAL-POPLITEAL BYPASS GRAFT Left 11/02/2015   Procedure: BYPASS GRAFT FEMORAL-POPLITEAL ARTERY VS FEMORAL-TIBIAL ARTERY BYPASS;  Surgeon:  Chuck Hinthristopher S Dickson, MD;  [facility];  Service: Vascular;  Laterality: Left;   FEMORAL-POPLITEAL BYPASS GRAFT Right 10/22/2016   Procedure: BYPASS GRAFT RIGHT FEMORAL- BELOW KNEE POPLITEAL ARTERY WITH VEIN;  Surgeon: Chuck Hintickson, Christopher S, MD;  [facility];  Service: Vascular;  Laterality: Right;   I&D EXTREMITY Left 11/10/2015   Procedure: Debridement Left Foot Ulcer, Application  Wound VAC;  Surgeon: Nadara MustardMarcus V Duda, MD;  [facility]: [facility] OR;  Service: Orthopedics;  Laterality: Left;   ILIAC ARTERY STENT Left 08/28/2015   common   INTRAOPERATIVE ARTERIOGRAM Left 09/05/2015   Procedure: INTRA OPERATIVE ARTERIOGRAM;  Surgeon: Chuck Hinthristopher S Dickson, MD;  [facility];  Service: Vascular;  Laterality: Left;   INTRAOPERATIVE ARTERIOGRAM Left 11/02/2015   Procedure: INTRA OPERATIVE ARTERIOGRAM;  Surgeon: Chuck Hinthristopher S Dickson, MD;  [facility];  Service: Vascular;  Laterality: Left;   INTRAOPERATIVE ARTERIOGRAM Right 10/22/2016   Procedure: INTRA OPERATIVE ARTERIOGRAM;  Surgeon: Chuck Hintickson, Christopher S, MD;  [facility];  Service: Vascular;  Laterality: Right;   LEXISCAN MYOVIEW  10/25/2010   Moderate perfusion defect due to infarct/scar with mild perinfarct ischemia seen in the Basal Inferolateral, Basal Anterolateral, Mid Inferolateral, and Mid Anterolateral regions. Post-stress EF is 50%.   LOWER EXTREMITY ANGIOGRAPHY N/A 10/17/2016   Procedure: Lower Extremity Angiography;  Surgeon: Runell Gess, MD;  Location: [facility] INVASIVE CV LAB;  Service: Cardiovascular;  Laterality: N/A;   OVARY SURGERY  1983?   "ruptured"   PERIPHERAL VASCULAR ANGIOGRAM  01/26/2010   High-grade SFA disease: left greater than right. Left SFA would require fem-pop bypass grafting. Right SFA could be stented but might require Diamondback Orbital atherectomy.   PERIPHERAL VASCULAR ANGIOGRAM  02/23/2010   Stealth Predator orbital rotational atherectomy performed on SFA & Popliteal up to 90,000 RPM.  Stenting using overlapping 5x137mm and 5x36mm Absolute Pro Nitinol self-expanding stents beginning just at the knee up to the mid SFA resulting in reduction of 90-95% calcified SFA & Popliteal stenosis to 0. Stenting performed on the distal common & proximal iliac artery with a 10x4 Absolute Pro- 70-0%.   PERIPHERAL VASCULAR ANGIOGRAM  06/17/2010   PTA performed to the right external iliac artery stent using a 5x100 balloon at 10 atmospheres. Stenting performed using a 6x18 Genesis on Opta balloon. Postdilatation with a 7x2 balloon resulting in a 95% "in-stent" stenosis to 0% residual.   PERIPHERAL VASCULAR ANGIOGRAM  06/04/2011   Bilateral total SFAs not percutaneously addressable. Good canidate for femoropopliteal bypass grafting   PERIPHERAL VASCULAR ANGIOGRAM  08/28/2015   PERIPHERAL VASCULAR CATHETERIZATION N/A 08/28/2015   Procedure: Lower Extremity Angiography;  Surgeon: Runell Gess, MD;  Location: [facility] INVASIVE CV LAB;  Service: Cardiovascular;  Laterality: N/A;   PERIPHERAL VASCULAR CATHETERIZATION N/A 08/28/2015   Procedure: Abdominal Aortogram;  Surgeon: Runell Gess, MD;  [facility]: [facility] INVAS[facility] CV LAB;  Service: Cardiovascular;  Laterality: N/A;   PERIPHERAL VASCULAR CATHETERIZATION Left 08/28/2015   Procedure: Peripheral Vascular Intervention;  Surgeon: Runell Gess, MD;  Location[facility] INVASIVE CV LAB;  Service: Cardiovascular;  Laterality: Left;  common iliac   PERIPHERAL VASCULAR CATHETERIZATION Left 08/28/2015   Procedure: Peripheral Vascular Atherectomy;  Surgeon: Runell Gess, MD;  Location: [facility] INVASIVE CV LAB;  Service: Cardiovascular;  Laterality: Left;  common iliac   PERIPHERAL VASCULAR CATHETERIZATION N/A 09/28/2015   Procedure: Lower Extremity Angiography;  Surgeon: Runell Gess, MD;  Location: [facility] INVASIVE CV LAB;  Service: Cardiovascular;  Laterality: N/A;   PERIPHERAL VASCULAR CATHETERIZATION Left 09/28/2015   Procedure: Peripheral Vascular Intervention;   Surgeon: Runell Gess, MD;  Location: [facility] INVASIVE CV LAB;  Service: Cardiovascular;  Laterality: Left CFA  PCI with 9 mm x 4 cm Abbott nitinol absolute Pro self-expanding stent      SKIN SPLIT GRAFT Left 12/01/2015   Procedure: LEFT FOOT SKIN GRAFT AND VAC;  Surgeon: [name], MD;  Location: [facility] OR;  Service: Orthopedics;  Laterality: Left;   THROMBECTOMY FEMORAL ARTERY Right 10/22/2016   Procedure: THROMBECTOMY FEMORAL ARTERY;  Surgeon: Chuck Hint, MD;  [facility];  Service: Vascular;  Laterality: Right;   TRANSTHORACIC ECHOCARDIOGRAM  10/17/2009   EF 45-50%, moderate hypokinesis of the entire inferolateral myocardium, mild concentric hypertrophy and mild regurg of the mitral valva.   VEIN HARVEST Left 11/02/2015   Procedure: LEFT GREATER SAPHENOUS VEIN HARVEST;  Surgeon: Chuck Hint, MD;  Location: [facility] OR;  Service: Vascular;  Laterality: Left;     Home Medications:  Prior to Admission medications  Medication Sig Start Date End Date Taking? Authorizing Provider  acetaminophen (TYLENOL) 325 MG tablet Take 650 mg by mouth every 6 (six) hours as needed for mild pain, fever or headache.   Yes [provider]  aspirin EC 81 MG tablet Take 81 mg by mouth daily.   Yes [provider]  atorvastatin (LIPITOR) 80 MG tablet Take 80 mg by mouth. for hyperlipidemia   Yes [provider]  clopidogrel (PLAVIX) 75 MG tablet Take 75 mg by mouth. for PVD   Yes [provider]  furosemide (LASIX) 40 MG tablet Take 1 tablet (40 mg total) by mouth 2 (two) times daily. 02/21/18  Yes Narda Bonds, MD  gabapentin (NEURONTIN) 300 MG capsule Take 300 mg by mouth 3 (three) times daily.  10/10/15  Yes [provider]  isosorbide mononitrate (IMDUR) 30 MG 24 hr tablet Take 90 mg by mouth daily. (Take along with 60 mg to equal 90 mg daily)   Yes [provider]  isosorbide mononitrate (IMDUR) 60 MG 24 hr tablet Take 90 mg by mouth  daily. (Take along with 30 mg to equal 90 mg daily)   Yes [provider]  LEVEMIR FLEXTOUCH 100 UNIT/ML Pen Inject 35 Units into the skin at bedtime.  03/15/16  Yes [provider]  losartan (COZAAR) 100 MG tablet TAKE 1 TABLET BY MOUTH EVERY DAY Patient taking differently: Take 100 mg by mouth daily.  06/17/16  Yes Lennette Bihari, MD  metFORMIN (GLUCOPHAGE) 1000 MG tablet Take 1,000 mg by mouth 2 (two) times daily with a meal.   Yes [provider]  metoprolol succinate (TOPROL-XL) 50 MG 24 hr tablet TAKE 1 AND 1/2 TABLETS BY MOUTH DAILY Patient taking differently: Take 75 mg by mouth daily.  04/15/18  Yes Lennette Bihari, MD  pantoprazole (PROTONIX) 40 MG tablet TAKE 1 TABLET BY MOUTH DAILY Patient taking differently: Take 40 mg by mouth daily.  04/13/18  Yes Lennette Bihari, MD  potassium chloride (K-DUR,KLOR-CON) 10 MEQ tablet Take 10 mEq by mouth daily.  05/31/16  Yes [provider]  VENTOLIN HFA 108 (90 Base) MCG/ACT inhaler Inhale 1 puff into the lungs every 4 (four) hours as needed for shortness of breath. 03/02/18  Yes [provider]    Inpatient Medications: Scheduled Meds:  [START ON 05/09/2018] aspirin EC  81 mg Oral Daily   atorvastatin  80 mg Oral q1800   clopidogrel  75 mg Oral Daily   [START ON 05/09/2018] furosemide  20 mg Intravenous BID   gabapentin  300 mg Oral TID   insulin aspart  0-5 Units Subcutaneous QHS   insulin aspart  0-9 Units Subcutaneous TID WC   insulin glargine  35 Units Subcutaneous Daily   isosorbide mononitrate  90 mg Oral Daily   losartan  100 mg Oral Daily   metoprolol succinate  75 mg Oral Daily   pantoprazole  40 mg Oral Daily   sodium chloride flush  3 mL Intravenous Q12H   Continuous Infusions:  sodium chloride     nitroGLYCERIN 5 mcg/min (05/08/18 0752)   PRN Meds: sodium chloride, acetaminophen **OR** acetaminophen, ondansetron **OR** ondansetron (ZOFRAN) IV, oxyCODONE, sodium chloride  flush  Allergies:    Allergies  Allergen Reactions   Doxycycline Other (See Comments)    lethargy   Hydrochlorothiazide Other (See Comments)    lethargic    Latex Rash   Penicillins Swelling and Rash    Pt states she has tolerated  Keflex in the past without problems. States she may have tolerated Augmentin in the past but it caused GI upset. Has patient had a PCN reaction causing immediate rash, facial/tongue/throat swelling, SOB or lightheadedness with hypotension: Yes Has patient had a PCN reaction causing severe rash involving mucus membranes or skin necrosis: No Has patient had a PCN reaction that required hospitalization No Has patient had a PCN reaction occurring within the last 10 years: No    Social History:   Social History   Socioeconomic History   Marital status: Legally Separated    Spouse name: Not on file   Number of children: Not on file   Years of education: Not on file   Highest education level: Not on file  Occupational History   Occupation: retired Dance movement psychotherapist strain: Not on file   Food insecurity:    Worry: Not on file    Inability: Not on file   Transportation needs:    Medical: Not on file    Non-medical: Not on file  Tobacco Use   Smoking status: Former Smoker    Packs/day: 1.50    Years: 41.00    Pack years: 61.50    Types: Cigarettes    Last attempt to quit: 10/12/2009    Years since quitting: 8.5   Smokeless tobacco: Never Used  Substance and Sexual Activity   Alcohol use: No   Drug use: No   Sexual activity: Not Currently    Birth control/protection: Surgical  Lifestyle   Physical activity:    Days per week: Not on file    Minutes per session: Not on file   Stress: Not on file  Relationships   Social connections:    Talks on phone: Not on file    Gets together: Not on file    Attends religious service: Not on file    Active member of club or organization: Not on file     Attends meetings of clubs or organizations: Not on file    Relationship status: Not on file   Intimate partner violence:    Fear of current or ex partner: Not on file    Emotionally abused: Not on file    Physically abused: Not on file    Forced sexual activity: Not on file  Other Topics Concern   Not on file  Social History Narrative   Ad[facility]   Former Smoker - stopped 2011   Alcohol  None   Full code    Family History:    Family History  Problem Relation Age of Onset   Hypertension Mother    Heart failure Mother    Heart failure Father    Stroke Father    Diabetes Father      ROS:  Please see the history of present illness.   All other ROS reviewed and negative.     Physical Exam/Data:   Vitals:   05/08/18 0900 05/08/18 0930 05/08/18 1129 05/08/18 1551  BP: (!) 183/101 (!) 161/88 (!) 157/94 103/64  Pulse: (!) 111 (!) 102 (!) 104 84  Resp: (!) 28 (!) 24 18 17   Temp:   97.9 F (36.6 C)   TempSrc:   Oral   SpO2: 97% 98% 97% 100%  Weight:      Height:       No intake or output data in the 24 hours ending 05/08/18 1850 Last 3 Weights 05/08/2018 02/21/2018 02/20/2018  Weight (  lbs) 180 lb 194 lb 4.8 oz 198 lb 12.8 oz  Weight (kg) 81.647 kg 88.134 kg 90.175 kg     Body mass index is 30.9 kg/m.  General:  Well nourished, well developed, in no acute distress, moderately obese HEENT: normal Lymph: no adenopathy Neck: no JVD Endocrine:  No thryomegaly Vascular: No carotid bruits; FA pulses 2+ bilaterally without bruits  Cardiac:  normal S1, S2; RRR; no murmur  Lungs:  clear to auscultation bilaterally, no wheezing, rhonchi or rales  Abd: soft, nontender, no hepatomegaly  Ext: 2+ bilateral pitting LE edema Musculoskeletal:  No deformities, BUE and BLE strength normal and equal Skin: warm and dry  Neuro:  CNs 2-12 intact, no focal abnormalities noted Psych:  Normal affect   EKG:  The EKG was personally reviewed and demonstrates:  NSR w/ new latera T wave changes Telemetry:  Telemetry was personally reviewed and demonstrates:  NSR 80s.  Relevant CV Studies: 2D echo 02/13/18 Study Conclusions  - Left ventricle: The cavity size was normal. Wall thickness was   normal. The estimated ejection fraction was 45%. Diffuse   hypokinesis. Features are consistent with a pseudonormal left   ventricular filling pattern, with concomitant abnormal relaxation   and increased filling pressure (grade 2 diastolic dysfunction). - Aortic valve: There was no stenosis. - Mitral valve: Mildly calcified annulus. There was no significant   regurgitation. - Left atrium: The atrium was mildly dilated. - Right ventricle: The cavity size was normal. Systolic function   was normal. - Pulmonary arteries: No complete TR doppler jet so unable to   estimate PA systolic pressure. - Systemic veins: IVC measured 2.5 cm with > 50% respirophasic   variation, suggesting RA pressure 8 mmHg. - Pericardium, extracardiac: A trivial pericardial effusion was   identified.  Impressions:  - Normal LV size with EF 45%, diffuse hypokinesis. Moderate   diastolic dysfunction. Normal RV size and systolic function. No   significant valvular abnormalities.  Laboratory Data:  Chemistry Recent Labs  Lab 05/08/18 0720 05/08/18 1121  NA 133*  --   K 5.3*  --   CL 100  --   CO2 18*  --   GLUCOSE 385*  --   BUN 29*  --   CREATININE 1.48* 1.30*  CALCIUM 9.3  --   GFRNONAA 36* 42*  GFRAA 42* 49*  ANIONGAP 15  --     No results for input(s): PROT, ALBUMIN, AST, ALT, ALKPHOS, BILITOT in the last 168 hours. Hematology Recent Labs  Lab 05/08/18 1121  WBC 12.2*  RBC 4.28  HGB 12.0  HCT 35.3*  MCV 82.5  MCH 28.0  MCHC 34.0  RDW 13.8  PLT 261   Cardiac Enzymes Recent Labs  Lab 05/08/18 0720 05/08/18 1121 05/08/18 1726  TROPONINI 0.09* 1.31* 9.92*   No results for input(s): TROPIPOC in the last 168 hours.  BNP Recent Labs  Lab  05/08/18 0720  BNP 624.8*    DDimer No results for input(s): DDIMER in the last 168 hours.  Radiology/Studies:  Dg Chest Portable 1 View  Result Date: 05/08/2018 CLINICAL DATA:  Shortness of breath. Nausea. Swelling of the lower extremities. EXAM: PORTABLE CHEST 1 VIEW COMPARISON:  02/19/2018 FINDINGS: Artifact overlies the chest. Chronic cardiomegaly and aortic atherosclerosis. Pulmonary venous hypertension with interstitial edema. No visible effusion. IMPRESSION: Congestive heart failure with cardiomegaly and interstitial edema. Electronically Signed   By: Paulina Fusi M.D.   On: 05/08/2018 08:11    Assessment and Plan:  CHIFFON KITTLESON is a 68 y.o. female with a hx of CAD and PVD as well as chronic combined systolic and diastolic HF, T2DM, HTN, HLD, CKD and prior h/o stroke, who is being seen today for the evaluation of NSTEMI and A/C combined systolic and diastolic CHF at the request of Dr. Alvino Chapel, Internal Medicine.   1. NSTEM/CAD: Prior STEMI in 2011 secondary to occluded circumflex which was treated with PCI.  Patient reported right arm pain around 1:30 AM today with associated acute onset dyspnea and vomiting.  Right arm pain is her anginal equivalent was present with her MI in 2011.  She is ruled in for non-STEMI with troponin increasing from 0.09>>1.31>>9.92.  EKG also shows new lateral T wave abnormalities.  She has been started on IV heparin and currently pain-free.  Plan for cardiac catheterization on Monday.  Continue IV heparin and IV nitro.  2. Acute on Chronic Combined Systolic and Diastolic CHF: Most recent echocardiogram 02/13/2018 showed mildly reduced left ventricular EF at 45% (chronic) with grade 2 diastolic dysfunction.  Patient is volume overloaded with evidence of peripheral edema on exam.  2+ lower extremity edema bilaterally. Admits to poor compliance w/ lasix at home. Needs IV diuretics. Low sodium diet. Strict I/Os. Daily weights.   3. HTN: better controlled now w/ IV  nitro. Continue. Continue home meds. Monitor.  4. CKD: SCr 1.3. will monitor closely w/ diuresis.   5. HLD: continue statin. LDL goal < 70 mg/dL. Check FLP in the AM.   6. DM: management per IM.   7. H/o CVA: continue ASA and Plavix.   8. Hyperkalemia: 5.3 in the ED. Getting IV Lasix. BMP in the AM.   9. PVD: angioplasty and stenting with diamondback rotational atherectomy for right popliteal and SFA in January 2012 and required repeat intervention in 2013 by Dr. Allyson Sabal. In October 2017, she was hospitalized and on 11/02/2015 had a left femoral to below-knee popliteal artery bypass by Dr. Durwin Nora. She has documented carotid stenosis with total right internal carotid occlusion and small amount of fibrous plaque in the left. Continue ASA, Plavix and statin.    For questions or updates, please contact [facility] Please consult [email or web address] for contact info under     Signed, Robbie Lis, PA-C  05/08/2018 6:50 PM   Patient examined chart reviewed. Obese female Sats ok not tachypnic. SEM rales at base plus 2 edema post right fem pop with bilateral bruits Previous stent to circumflex Admitted mostly with dyspnea and CHF with some right arm pain. ECG with new lateral T wave changes and troponin now over 9. Stable with improvement currently. Was on chronic plavix for CVA and known total right ICA stenosis and PVD. Check P2Y Brillinta may be better post cath. Start systemic heparin. Continue bid lasix Echo in am to see EF and r/o more ischemic MR. Discussed need for cath Monday or sooner if she worsens Risks including MI, bleeding , stroke dye reaction and need for emergency surgery discussed Willing to proceed. Orders written and placed on board for TK Monday  Charlton Haws

## 2018-05-08 NOTE — Progress Notes (Signed)
CRITICAL VALUE ALERT  Critical Value:  Troponin 9.92  Date & Time Notied:  4/10, 1832  Provider Notified: J. Choi  Orders Received/Actions taken: obtain EKG  -pt still with no c/o chest pain.

## 2018-05-08 NOTE — Progress Notes (Signed)
ANTICOAGULATION CONSULT NOTE - Initial Consult  Pharmacy Consult for heparin Indication: chest pain/ACS  Allergies  Allergen Reactions  . Doxycycline Other (See Comments)    lethargy  . Hydrochlorothiazide Other (See Comments)    lethargic   . Latex Rash  . Penicillins Swelling and Rash    Pt states she has tolerated Keflex in the past without problems. States she may have tolerated Augmentin in the past but it caused GI upset. Has patient had a PCN reaction causing immediate rash, facial/tongue/throat swelling, SOB or lightheadedness with hypotension: Yes Has patient had a PCN reaction causing severe rash involving mucus membranes or skin necrosis: No Has patient had a PCN reaction that required hospitalization No Has patient had a PCN reaction occurring within the last 10 years: No    Patient Measurements: Height: 5\' 4"  (162.6 cm) Weight: 180 lb (81.6 kg) IBW/kg (Calculated) : 54.7 Heparin Dosing Weight: 72kg  Vital Signs: Temp: 97.9 F (36.6 C) (04/10 1129) Temp Source: Oral (04/10 1129) BP: 103/64 (04/10 1551) Pulse Rate: 84 (04/10 1551)  Labs: Recent Labs    05/08/18 0720 05/08/18 1121 05/08/18 1726  HGB  --  12.0  --   HCT  --  35.3*  --   PLT  --  261  --   CREATININE 1.48* 1.30*  --   TROPONINI 0.09* 1.31* 9.92*    Estimated Creatinine Clearance: 42.8 mL/min (A) (by C-G formula based on SCr of 1.3 mg/dL (H)).   Medical History: Past Medical History:  Diagnosis Date  . Anemia 10/2015   Acute Blood Loss  . Arthritis    "feel like I have it all over" (08/28/2015)  . CHF (congestive heart failure) (HCC)   . Complication of anesthesia    DIFFICULT WAKING "only when I was smoking; no problems since I quit"  . Coronary artery disease   . Family history of adverse reaction to anesthesia    sister slow to wake up  . GERD (gastroesophageal reflux disease)    takes Protonix daily   . Hip bursitis   . History of blood transfusion    10/2015  .  Hyperlipidemia LDL goal < 70 06/28/2013   takes Atorvastatin daily  . Hypertension    takes Metoprolol and Imdur daily  . Hypoxia 01/2018  . Malnutrition (HCC)   . Migraine    "none in a long time" (08/28/2015)  . Myocardial infarction (HCC) 2011  . Neuromuscular disorder (HCC)    DIABETIC NEUROPATHY  . Osteomyelitis (HCC) 2017   Left foot  . PAD (peripheral artery disease) (HCC)   . Peripheral vascular disease (HCC)   . Respiratory failure (HCC) 10/2015   Acute Hypoxia- acute pulmonary edema 11/13/2015  . Septic shock (HCC) 10/2015  . Stroke (HCC)   . Type II diabetes mellitus (HCC)    takes Lantus nightly.Average fasting blood sugar runs 80-90  Type II    Assessment: 84 yoF with known CAD admitted with SOB now with rapidly rising troponins to begin on IV heparin. H/H wnl, no AC prior to admit.  Goal of Therapy:  Heparin level 0.3-0.7 units/ml Monitor platelets by anticoagulation protocol: Yes   Plan:  -Heparin 4000 units x1 -Heparin 900 units/hr -Check 8-hr heparin level -Monitor heparin level, CBC, S/Sx bleeding daily  Fredonia Highland, PharmD, BCPS Clinical Pharmacist [phone] Please check AMION for all [facility] numbers 05/08/2018

## 2018-05-08 NOTE — Progress Notes (Addendum)
CRITICAL VALUE ALERT  Critical Value:  Troponin 1.31  Date & Time Notied:  05/08/18 - 12:55  Provider Notified: choi  Orders Received/Actions taken: trend troponins, monitor for s/s of chest pain. Pt is currently chest pain free.

## 2018-05-08 NOTE — ED Notes (Signed)
Called patient's daughter at her request Leotis Shames [phone] update given, I gave her daughter my number to call when she wanted.

## 2018-05-08 NOTE — Progress Notes (Signed)
Pt states that she does not use CPAP at home and that she does not need it now.  Pt informed that a CPAP unit will be available if she changes her mind.

## 2018-05-08 NOTE — ED Provider Notes (Signed)
[facility] EMERGENCY DEPARTMENT Provider Note   CSN: [id number] Arrival date & time: 05/08/18  0645    History   Chief Complaint Chief Complaint  Patient presents with   Shortness of Breath    HPI [name] [name] is a 68 y.o. female.     HPI   67yF with past history significant forCAD with stent, PAD status post bilateral lower extremity bypass, hypertension, chronic systolic CHF, insulin-dependent diabetes mellitus, and noncompliance, now presenting with dyspnea. She went to sleep in usual state of health and then woke up around 0130 feeling SOB. Baseline orthopnea, but now worse. Occasional nonproductive cough. No fever or chills. No acute pain. LE swelling has been worsening. She feels like her weight has been stable but not actually checking daily. Reports compliance with med.   Past Medical History:  Diagnosis Date   Anemia 10/2015   Acute Blood Loss   Arthritis    "feel like I have it all over" (08/28/2015)   CHF (congestive heart failure) (HCC)    Complication of anesthesia    DIFFICULT WAKING "only when I was smoking; no problems since I quit"   Coronary artery disease    Family history of adverse reaction to anesthesia    sister slow to wake up   GERD (gastroesophageal reflux disease)    takes Protonix daily    Hip bursitis    History of blood transfusion    10/2015   Hyperlipidemia LDL goal < 70 06/28/2013   takes Atorvastatin daily   Hypertension    takes Metoprolol and Imdur daily   Hypoxia 01/2018   Malnutrition (HCC)    Migraine    "none in a long time" (08/28/2015)   Myocardial infarction (HCC) 2011   Neuromuscular disorder (HCC)    DIABETIC NEUROPATHY   Osteomyelitis (HCC) 2017   Left foot   PAD (peripheral artery disease) (HCC)    Peripheral vascular disease (HCC)    Respiratory failure (HCC) 10/2015   Acute Hypoxia- acute pulmonary edema 11/13/2015   Septic shock (HCC) 10/2015   Stroke (HCC)    Type  II diabetes mellitus (HCC)    takes Lantus nightly.Average fasting blood sugar runs 80-90  Type II    Patient Active Problem List   Diagnosis Date Noted   Renal insufficiency 02/13/2018   AKI (acute kidney injury) (HCC) 02/13/2018   Acute systolic CHF (congestive heart failure) (HCC) 02/13/2018   Benign hypertension 11/26/2017   Gangrene of right foot (HCC) 11/01/2016   GERD (gastroesophageal reflux disease) 11/01/2016   Gangrene of left foot (HCC) 10/17/2016   Sepsis (HCC) 10/17/2016   Midfoot ulcer, left, limited to breakdown of skin (HCC) 01/08/2016   Thigh hematoma 12/22/2015   History of blood transfusion 12/22/2015   Acute on chronic systolic CHF (congestive heart failure) (HCC) 12/22/2015   H/O skin graft 12/11/2015   NSTEMI (non-ST elevated myocardial infarction) (HCC) 11/19/2015   DM type 2 with diabetic peripheral neuropathy (HCC) 11/19/2015   Traumatic hemorrhagic shock (HCC)    Chronic systolic CHF (congestive heart failure) (HCC)    Uncontrolled type 2 diabetes mellitus with complication (HCC)    Demand myocardial infarction (HCC) 11/08/2015   Surgery, elective    Central line infection, initial encounter    Septic shock (HCC) 11/03/2015   Encephalopathy acute 11/03/2015   Acute blood loss as cause of postoperative anemia 11/03/2015   Acute respiratory failure with hypoxia (HCC)    CVA (cerebral vascular accident) (HCC)  S/P femoral-popliteal bypass surgery    Malnutrition of moderate degree 11/01/2015   Diabetic ulcer of left midfoot associated with diabetes mellitus due to underlying condition, with muscle involvement without evidence of necrosis (HCC)    Hypertensive heart disease with chronic diastolic congestive heart failure (HCC) 10/31/2015   Osteomyelitis of left foot (HCC) 10/30/2015   PVD (peripheral vascular disease) with claudication (HCC) 09/28/2015   Critical lower limb ischemia 08/28/2015   Hyperlipidemia with  target LDL less than 70 06/28/2013   Diabetes mellitus type II, uncontrolled (HCC) 03/03/2013   Complicated migraine 03/03/2013   Stroke-like symptom 03/02/2013   Diabetes mellitus (HCC) 03/02/2013   Aphasia 03/02/2013   Headache(784.0) 03/02/2013   PAD (peripheral artery disease) (HCC) 06/05/2011   Coronary artery disease involving native coronary artery of native heart without angina pectoris 06/05/2011   Hypotension 06/05/2011   Claudication in peripheral vascular disease:  Lifestyle limiting. 06/05/2011   Hx of tobacco use, presenting hazards to health 06/05/2011    Past Surgical History:  Procedure Laterality Date   ABDOMINAL HYSTERECTOMY     AMPUTATION Right 10/22/2016   Procedure: AMPUTATION DIGIT RIGHT TOES 1-3 POSSIBLE TRANSMETATARSAL;  Surgeon: Chuck Hint, MD;  Location: [facility] OR;  Service: Vascular;  Laterality: Right;   APPENDECTOMY     ATHERECTOMY N/A 06/04/2011   Procedure: ATHERECTOMY;  Surgeon: Runell Gess, MD;  Location: [facility] CATH LAB;  Service: Cardiovascular;  Laterality: N/A;   CARDIAC CATHETERIZATION  10/13/2009   95% stenosis in the AV groove circumflex and 95% ostial stenosis in small OM3. A 3x63mm drug-eluting Promus stent inserted ito the circumflex. Dilatated with a 3.25x21mm noncompliant Quantum balloon within entire segment. The entire region was reduced to 0% and brisk TIMI3 flow.   CAROTID DUPLEX  03/19/2011   Right ICA-demonstrates complete occlusion. Left ICA-demonstrates a small amount of fibrous plaque.   CATARACT EXTRACTION W/ INTRAOCULAR LENS IMPLANT Right    CESAREAN SECTION  1990   CORONARY ANGIOPLASTY     ENDARTERECTOMY FEMORAL Left 09/05/2015   Procedure: ENDARTERECTOMY FEMORAL WITH PROFUNDOPLASTY;  Surgeon: Chuck Hint, MD;  Location: [facility] OR;  Service: Vascular;  Laterality: Left;  Left common femoral artery vein patch using left saphenous vien   ENDARTERECTOMY FEMORAL Right 10/22/2016   Procedure:  ENDARTERECTOMY RIGHT COMMON FEMORAL;  Surgeon: Chuck Hint, MD;  Location: [facility] OR;  Service: Vascular;  Laterality: Right;   FEMORAL-POPLITEAL BYPASS GRAFT Left 11/02/2015   Procedure: BYPASS GRAFT FEMORAL-POPLITEAL ARTERY VS FEMORAL-TIBIAL ARTERY BYPASS;  Surgeon: Chuck Hint, MD;  Location: [facility] OR;  Service: Vascular;  Laterality: Left;   FEMORAL-POPLITEAL BYPASS GRAFT Right 10/22/2016   Procedure: BYPASS GRAFT RIGHT FEMORAL- BELOW KNEE POPLITEAL ARTERY WITH VEIN;  Surgeon: Chuck Hint, MD;  Location: [facility] OR;  Service: Vascular;  Laterality: Right;   I&D EXTREMITY Left 11/10/2015   Procedure: Debridement Left Foot Ulcer, Application  Wound VAC;  Surgeon: [name], MD;  Location: [facility] OR;  Service: Orthopedics;  Laterality: Left;   ILIAC ARTERY STENT Left 08/28/2015   common   INTRAOPERATIVE ARTERIOGRAM Left 09/05/2015   Procedure: INTRA OPERATIVE ARTERIOGRAM;  Surgeon: Chuck Hint, MD;  Location: [facility] OR;  Service: Vascular;  Laterality: Left;   INTRAOPERATIVE ARTERIOGRAM Left 11/02/2015   Procedure: INTRA OPERATIVE ARTERIOGRAM;  Surgeon: Chuck Hint, MD;  Location: [facility] OR;  Service: Vascular;  Laterality: Left;   INTRAOPERATIVE ARTERIOGRAM Right 10/22/2016   Procedure: INTRA OPERATIVE ARTERIOGRAM;  Surgeon: Chuck Hint, MD;  Location: [facility]  OR;  Service: Vascular;  Laterality: Right;   LEXISCAN MYOVIEW  10/25/2010   Moderate perfusion defect due to infarct/scar with mild perinfarct ischemia seen in the Basal Inferolateral, Basal Anterolateral, Mid Inferolateral, and Mid Anterolateral regions. Post-stress EF is 50%.   LOWER EXTREMITY ANGIOGRAPHY N/A 10/17/2016   Procedure: Lower Extremity Angiography;  Surgeon: Runell Gess, MD;  Location: [facility] INVASIVE CV LAB;  Service: Cardiovascular;  Laterality: N/A;   OVARY SURGERY  1983?   "ruptured"   PERIPHERAL VASCULAR ANGIOGRAM  01/26/2010   High-grade SFA disease: left greater than  right. Left SFA would require fem-pop bypass grafting. Right SFA could be stented but might require Diamondback Orbital atherectomy.   PERIPHERAL VASCULAR ANGIOGRAM  02/23/2010   Stealth Predator orbital rotational atherectomy performed on SFA & Popliteal up to 90,000 RPM. Stenting using overlapping 5x182mm and 5x24mm Absolute Pro Nitinol self-expanding stents beginning just at the knee up to the mid SFA resulting in reduction of 90-95% calcified SFA & Popliteal stenosis to 0. Stenting performed on the distal common & proximal iliac artery with a 10x4 Absolute Pro- 70-0%.   PERIPHERAL VASCULAR ANGIOGRAM  06/17/2010   PTA performed to the right external iliac artery stent using a 5x100 balloon at 10 atmospheres. Stenting performed using a 6x18 Genesis on Opta balloon. Postdilatation with a 7x2 balloon resulting in a 95% "in-stent" stenosis to 0% residual.   PERIPHERAL VASCULAR ANGIOGRAM  06/04/2011   Bilateral total SFAs not percutaneously addressable. Good canidate for femoropopliteal bypass grafting   PERIPHERAL VASCULAR ANGIOGRAM  08/28/2015   PERIPHERAL VASCULAR CATHETERIZATION N/A 08/28/2015   Procedure: Lower Extremity Angiography;  Surgeon: Runell Gess, MD;  Location: [facility] INVASIVE CV LAB;  Service: Cardiovascular;  Laterality: N/A;   PERIPHERAL VASCULAR CATHETERIZATION N/A 08/28/2015   Procedure: Abdominal Aortogram;  Surgeon: Runell Gess, MD;  Location: [facility] INVASIVE CV LAB;  Service: Cardiovascular;  Laterality: N/A;   PERIPHERAL VASCULAR CATHETERIZATION Left 08/28/2015   Procedure: Peripheral Vascular Intervention;  Surgeon: Runell Gess, MD;  Location: [facility] INVASIVE CV LAB;  Service: Cardiovascular;  Laterality: Left;  common iliac   PERIPHERAL VASCULAR CATHETERIZATION Left 08/28/2015   Procedure: Peripheral Vascular Atherectomy;  Surgeon: Runell Gess, MD;  Location: [facility] INVASIVE CV LAB;  Service: Cardiovascular;  Laterality: Left;  common iliac   PERIPHERAL VASCULAR  CATHETERIZATION N/A 09/28/2015   Procedure: Lower Extremity Angiography;  Surgeon: Runell Gess, MD;  Location: [facility] INVASIVE CV LAB;  Service: Cardiovascular;  Laterality: N/A;   PERIPHERAL VASCULAR CATHETERIZATION Left 09/28/2015   Procedure: Peripheral Vascular Intervention;  Surgeon: Runell Gess, MD;  Location: [facility] INVASIVE CV LAB;  Service: Cardiovascular;  Laterality: Left CFA  PCI with 9 mm x 4 cm Abbott nitinol absolute Pro self-expanding stent      SKIN SPLIT GRAFT Left 12/01/2015   Procedure: LEFT FOOT SKIN GRAFT AND VAC;  Surgeon: [name], MD;  Location: [facility] OR;  Service: Orthopedics;  Laterality: Left;   THROMBECTOMY FEMORAL ARTERY Right 10/22/2016   Procedure: THROMBECTOMY FEMORAL ARTERY;  Surgeon: Chuck Hint, MD;  Location: [facility] OR;  Service: Vascular;  Laterality: Right;   TRANSTHORACIC ECHOCARDIOGRAM  10/17/2009   EF 45-50%, moderate hypokinesis of the entire inferolateral myocardium, mild concentric hypertrophy and mild regurg of the mitral valva.   VEIN HARVEST Left 11/02/2015   Procedure: LEFT GREATER SAPHENOUS VEIN HARVEST;  Surgeon: Chuck Hint, MD;  Location: [facility] OR;  Service: Vascular;  Laterality: Left;     OB History  No obstetric history on file.      Home Medications    Prior to Admission medications   Medication Sig Start Date End Date Taking? Authorizing Provider  aspirin EC 81 MG tablet Take 81 mg by mouth daily.    [provider]  atorvastatin (LIPITOR) 80 MG tablet Take 80 mg by mouth. Take one tablet daily for hyperlipidemia    [provider]  clopidogrel (PLAVIX) 75 MG tablet Take 75 mg by mouth. Take one tablet daily for PVD    [provider]  furosemide (LASIX) 40 MG tablet Take 1 tablet (40 mg total) by mouth 2 (two) times daily. 02/21/18   Narda Bonds, MD  gabapentin (NEURONTIN) 300 MG capsule Take 300 mg by mouth 3 (three) times daily.  10/10/15   [provider]  isosorbide  mononitrate (IMDUR) 30 MG 24 hr tablet Take 90 mg by mouth daily. Take 1 tablet along by mouth with 60 mg to equal 90 mg daily.     [provider]  isosorbide mononitrate (IMDUR) 60 MG 24 hr tablet Take 90 mg by mouth daily. Take 1 tablet by mouth along with 30 mg to equal 90 mg daily    [provider]  LEVEMIR FLEXTOUCH 100 UNIT/ML Pen Inject 35 Units into the skin at bedtime.  03/15/16   [provider]  losartan (COZAAR) 100 MG tablet TAKE 1 TABLET BY MOUTH EVERY DAY Patient taking differently: Take 100 mg by mouth daily.  06/17/16   Lennette Bihari, MD  metFORMIN (GLUCOPHAGE) 1000 MG tablet Take 1,000 mg by mouth 2 (two) times daily with a meal.    [provider]  metoprolol succinate (TOPROL-XL) 50 MG 24 hr tablet TAKE 1 AND 1/2 TABLETS BY MOUTH DAILY 04/15/18   Lennette Bihari, MD  pantoprazole (PROTONIX) 40 MG tablet TAKE 1 TABLET BY MOUTH DAILY 04/13/18   Lennette Bihari, MD  potassium chloride (K-DUR,KLOR-CON) 10 MEQ tablet Take 10 mEq by mouth daily.  05/31/16   [provider]    Family History Family History  Problem Relation Age of Onset   Hypertension Mother    Heart failure Mother    Heart failure Father    Stroke Father    Diabetes Father     Social History Social History   Tobacco Use   Smoking status: Former Smoker    Packs/day: 1.50    Years: 41.00    Pack years: 61.50    Types: Cigarettes    Last attempt to quit: 10/12/2009    Years since quitting: 8.5   Smokeless tobacco: Never Used  Substance Use Topics   Alcohol use: No   Drug use: No     Allergies   Doxycycline; Hydrochlorothiazide; Latex; and Penicillins   Review of Systems Review of Systems  All systems reviewed and negative, other than as noted in HPI.  Physical Exam Updated Vital Signs BP (!) 174/92    Pulse (!) 108    Temp 98 F (36.7 C) (Oral)    Resp (!) 24    Ht 5\' 4"  (1.626 m)    Wt 81.6 kg    SpO2 97%    BMI 30.90 kg/m   Physical  Exam Vitals signs and nursing note reviewed.  Constitutional:      General: She is not in acute distress.    Appearance: She is well-developed. She is obese.  HENT:     Head: Normocephalic and atraumatic.  Eyes:  General:        Right eye: No discharge.        Left eye: No discharge.     Conjunctiva/sclera: Conjunctivae normal.  Neck:     Musculoskeletal: Neck supple.  Cardiovascular:     Rate and Rhythm: Regular rhythm. Tachycardia present.     Heart sounds: Normal heart sounds. No murmur. No friction rub. No gallop.   Pulmonary:     Effort: Pulmonary effort is normal. No respiratory distress.     Breath sounds: Normal breath sounds.  Abdominal:     General: There is no distension.     Palpations: Abdomen is soft.     Tenderness: There is no abdominal tenderness.  Musculoskeletal:        General: No tenderness.     Right lower leg: Edema present.     Left lower leg: Edema present.  Skin:    General: Skin is warm and dry.  Neurological:     Mental Status: She is alert.  Psychiatric:        Behavior: Behavior normal.        Thought Content: Thought content normal.      ED Treatments / Results  Labs (all labs ordered are listed, but only abnormal results are displayed) Labs Reviewed  BRAIN NATRIURETIC PEPTIDE - Abnormal; Notable for the following components:      Result Value   B Natriuretic Peptide 624.8 (*)    All other components within normal limits  BASIC METABOLIC PANEL - Abnormal; Notable for the following components:   Sodium 133 (*)    Potassium 5.3 (*)    CO2 18 (*)    Glucose, Bld 385 (*)    BUN 29 (*)    Creatinine, Ser 1.48 (*)    GFR calc non Af Amer 36 (*)    GFR calc Af Amer 42 (*)    All other components within normal limits  TROPONIN I - Abnormal; Notable for the following components:   Troponin I 0.09 (*)    All other components within normal limits  CBC WITH DIFFERENTIAL/PLATELET - Abnormal; Notable for the following components:   WBC  12.2 (*)    HCT 35.3 (*)    Neutro Abs 10.6 (*)    All other components within normal limits  CREATININE, SERUM - Abnormal; Notable for the following components:   Creatinine, Ser 1.30 (*)    GFR calc non Af Amer 42 (*)    GFR calc Af Amer 49 (*)    All other components within normal limits  TROPONIN I - Abnormal; Notable for the following components:   Troponin I 1.31 (*)    All other components within normal limits  TROPONIN I - Abnormal; Notable for the following components:   Troponin I 9.92 (*)    All other components within normal limits  GLUCOSE, CAPILLARY - Abnormal; Notable for the following components:   Glucose-Capillary 282 (*)    All other components within normal limits  GLUCOSE, CAPILLARY - Abnormal; Notable for the following components:   Glucose-Capillary 226 (*)    All other components within normal limits  CBC - Abnormal; Notable for the following components:   RBC 3.53 (*)    Hemoglobin 9.8 (*)    HCT 30.0 (*)    All other components within normal limits  BASIC METABOLIC PANEL - Abnormal; Notable for the following components:   CO2 20 (*)    Glucose, Bld 127 (*)    BUN 37 (*)  Creatinine, Ser 2.07 (*)    Calcium 8.4 (*)    GFR calc non Af Amer 24 (*)    GFR calc Af Amer 28 (*)    All other components within normal limits  TROPONIN I - Abnormal; Notable for the following components:   Troponin I 7.63 (*)    All other components within normal limits  TROPONIN I - Abnormal; Notable for the following components:   Troponin I 7.09 (*)    All other components within normal limits  PLATELET INHIBITION P2Y12 - Abnormal; Notable for the following components:   Platelet Function  P2Y12 383 (*)    All other components within normal limits  GLUCOSE, CAPILLARY - Abnormal; Notable for the following components:   Glucose-Capillary 194 (*)    All other components within normal limits  GLUCOSE, CAPILLARY - Abnormal; Notable for the following components:    Glucose-Capillary 122 (*)    All other components within normal limits  GLUCOSE, CAPILLARY - Abnormal; Notable for the following components:   Glucose-Capillary 250 (*)    All other components within normal limits  MRSA PCR SCREENING  HEPARIN LEVEL (UNFRACTIONATED)  HEPARIN LEVEL (UNFRACTIONATED)  CBC WITH DIFFERENTIAL/PLATELET  TROPONIN I    EKG None  Radiology Dg Chest Portable 1 View  Result Date: 05/08/2018 CLINICAL DATA:  Shortness of breath. Nausea. Swelling of the lower extremities. EXAM: PORTABLE CHEST 1 VIEW COMPARISON:  02/19/2018 FINDINGS: Artifact overlies the chest. Chronic cardiomegaly and aortic atherosclerosis. Pulmonary venous hypertension with interstitial edema. No visible effusion. IMPRESSION: Congestive heart failure with cardiomegaly and interstitial edema. Electronically Signed   By: Paulina Fusi M.D.   On: 05/08/2018 08:11    Procedures Procedures (including critical care time)  CRITICAL CARE Performed by: Raeford Razor Total critical care time:35 minutes Critical care time was exclusive of separately billable procedures and treating other patients. Critical care was necessary to treat or prevent imminent or life-threatening deterioration. Critical care was time spent personally by me on the following activities: development of treatment plan with patient and/or surrogate as well as nursing, discussions with consultants, evaluation of patient's response to treatment, examination of patient, obtaining history from patient or surrogate, ordering and performing treatments and interventions, ordering and review of laboratory studies, ordering and review of radiographic studies, pulse oximetry and re-evaluation of patient's condition.   Medications Ordered in ED Medications  nitroGLYCERIN 50 mg in dextrose 5 % 250 mL (0.2 mg/mL) infusion (0 mcg/min Intravenous Stopped 05/09/18 0110)  aspirin EC tablet 81 mg (81 mg Oral Given 05/09/18 1035)  atorvastatin (LIPITOR)  tablet 80 mg (80 mg Oral Given 05/08/18 1734)  isosorbide mononitrate (IMDUR) 24 hr tablet 90 mg (90 mg Oral Given 05/09/18 1036)  losartan (COZAAR) tablet 100 mg (100 mg Oral Given 05/09/18 1035)  metoprolol succinate (TOPROL-XL) 24 hr tablet 75 mg (75 mg Oral Given 05/09/18 1036)  pantoprazole (PROTONIX) EC tablet 40 mg (40 mg Oral Given 05/09/18 1037)  clopidogrel (PLAVIX) tablet 75 mg (75 mg Oral Given 05/09/18 1035)  gabapentin (NEURONTIN) capsule 300 mg (300 mg Oral Given 05/09/18 1037)  sodium chloride flush (NS) 0.9 % injection 3 mL (3 mLs Intravenous Given 05/09/18 1239)  sodium chloride flush (NS) 0.9 % injection 3 mL (has no administration in time range)  0.9 %  sodium chloride infusion (has no administration in time range)  acetaminophen (TYLENOL) tablet 650 mg (has no administration in time range)    Or  acetaminophen (TYLENOL) suppository 650 mg (has no administration in  time range)  ondansetron (ZOFRAN) tablet 4 mg (has no administration in time range)    Or  ondansetron (ZOFRAN) injection 4 mg (has no administration in time range)  oxyCODONE (Oxy IR/ROXICODONE) immediate release tablet 5 mg (has no administration in time range)  insulin glargine (LANTUS) injection 35 Units (35 Units Subcutaneous Given 05/09/18 1036)  insulin aspart (novoLOG) injection 0-9 Units (3 Units Subcutaneous Given 05/09/18 1238)  insulin aspart (novoLOG) injection 0-5 Units (0 Units Subcutaneous Not Given 05/08/18 2137)  heparin ADULT infusion 100 units/mL (25000 units/263mL sodium chloride 0.45%) (900 Units/hr Intravenous Rate/Dose Verify 05/09/18 0500)  furosemide (LASIX) injection 40 mg (40 mg Intravenous Given 05/08/18 0737)  potassium chloride SA (K-DUR,KLOR-CON) CR tablet 40 mEq (40 mEq Oral Given 05/08/18 0737)  furosemide (LASIX) injection 40 mg (40 mg Intravenous Given 05/08/18 0854)  aspirin chewable tablet 324 mg (324 mg Oral Given 05/08/18 0908)  heparin bolus via infusion 4,000 Units (4,000 Units  Intravenous Bolus from Bag 05/08/18 2004)  sodium chloride 0.9 % bolus 500 mL ( Intravenous Rate/Dose Verify 05/09/18 0327)     Initial Impression / Assessment and Plan / ED Course  I have reviewed the triage vital signs and the nursing notes.  Pertinent labs & imaging results that were available during my care of the patient were reviewed by me and considered in my medical decision making (see chart for details).     68yF with dyspnea. Clinically HF. Significant hypertension. Peripheral edema. Sounds wet on auscultation. CXR with edema. BNP elevated. Doubt COVID/infectious. Afebrile. No known sick contacts or travel to high incidence area. She was started on bipap, nitro and lasix ordered. Troponin mildly elevated but she denies pain.   SIARAH DELEO was evaluated in Emergency Department on 05/09/2018 for the symptoms described in the history of present illness. She was evaluated in the context of the global COVID-19 pandemic, which necessitated consideration that the patient might be at risk for infection with the SARS-CoV-2 virus that causes COVID-19. Institutional protocols and algorithms that pertain to the evaluation of patients at risk for COVID-19 are in a state of rapid change based on information released by regulatory bodies including the CDC and federal and state organizations. These policies and algorithms were followed during the patient's care in the ED.   Final Clinical Impressions(s) / ED Diagnoses   Final diagnoses:  Congestive heart failure, unspecified HF chronicity, unspecified heart failure type (HCC)  Elevated troponin    ED Discharge Orders    None       Raeford Razor, MD 05/09/18 1243

## 2018-05-08 NOTE — ED Triage Notes (Signed)
States she woke up at 130 am nauseated sob, denies pain. , states she takes her medication as directed , bilateral pedal edema. States its hard for her to lay flat.

## 2018-05-08 NOTE — Progress Notes (Signed)
   Troponin elevated 9.92. No complaints of chest pain currently. EKG with ST depression with T inversion anteriorlateral leads. Spoke with cardiolgy on call. Will start IV heparin. Lasix 20mg  BID. Trend troponin and EKG. They will consult in AM.   Called and spoke with daughter.   Noralee Stain, DO [facility] [email or web address] 05/08/2018, 6:51 PM

## 2018-05-08 NOTE — ED Notes (Signed)
ED TO INPATIENT HANDOFF REPORT  ED Nurse Name and Phone #: Lew Dawes RN [phone]  S Name/Age/Gender [name] [name] Age 68 y.o. female Room/Bed: 032C/032C  Code Status   Code Status: Prior  Home/SNF/Other Home {Patient oriented to time ,person , place and event Is this baseline? yes  Triage Complete: Triage complete  Chief Complaint Headache;emesis  Triage Note States she woke up at 130 am nauseated sob, denies pain. , states she takes her medication as directed , bilateral pedal edema. States its hard for her to lay flat.    Allergies Allergies  Allergen Reactions  . Doxycycline Other (See Comments)    lethargy  . Hydrochlorothiazide Other (See Comments)    lethargic   . Latex Rash  . Penicillins Swelling and Rash    Pt states she has tolerated Keflex in the past without problems. States she may have tolerated Augmentin in the past but it caused GI upset. Has patient had a PCN reaction causing immediate rash, facial/tongue/throat swelling, SOB or lightheadedness with hypotension: Yes Has patient had a PCN reaction causing severe rash involving mucus membranes or skin necrosis: No Has patient had a PCN reaction that required hospitalization No Has patient had a PCN reaction occurring within the last 10 years: No    Level of Care/Admitting Diagnosis ED Disposition    ED Disposition Condition Comment   Admit  Hospital Area: [facility] [[id number]]  Level of Care: Progressive [102]  Diagnosis: Acute on chronic combined systolic and diastolic CHF (congestive heart failure) [facility]) [[id number]]  Admitting Physician: Noralee Stain [id number]  Attending Physician: Noralee Stain [phone]  Estimated length of stay: 3 - 4 days  Certification:: I certify this patient will need inpatient services for at least 2 midnights  PT Class (Do Not Modify): Inpatient [101]  PT Acc Code (Do Not Modify): Private [1]       B Medical/Surgery History Past Medical History:   Diagnosis Date  . Anemia 10/2015   Acute Blood Loss  . Arthritis    "feel like I have it all over" (08/28/2015)  . CHF (congestive heart failure) (HCC)   . Complication of anesthesia    DIFFICULT WAKING "only when I was smoking; no problems since I quit"  . Coronary artery disease   . Family history of adverse reaction to anesthesia    sister slow to wake up  . GERD (gastroesophageal reflux disease)    takes Protonix daily   . Hip bursitis   . History of blood transfusion    10/2015  . Hyperlipidemia LDL goal < 70 06/28/2013   takes Atorvastatin daily  . Hypertension    takes Metoprolol and Imdur daily  . Hypoxia 01/2018  . Malnutrition (HCC)   . Migraine    "none in a long time" (08/28/2015)  . Myocardial infarction (HCC) 2011  . Neuromuscular disorder (HCC)    DIABETIC NEUROPATHY  . Osteomyelitis (HCC) 2017   Left foot  . PAD (peripheral artery disease) (HCC)   . Peripheral vascular disease (HCC)   . Respiratory failure (HCC) 10/2015   Acute Hypoxia- acute pulmonary edema 11/13/2015  . Septic shock (HCC) 10/2015  . Stroke (HCC)   . Type II diabetes mellitus (HCC)    takes Lantus nightly.Average fasting blood sugar runs 80-90  Type II   Past Surgical History:  Procedure Laterality Date  . ABDOMINAL HYSTERECTOMY    . AMPUTATION Right 10/22/2016   Procedure: AMPUTATION DIGIT RIGHT TOES 1-3 POSSIBLE TRANSMETATARSAL;  Surgeon: Edilia Bo,  Di Kindle, MD;  Location: [facility] OR;  Service: Vascular;  Laterality: Right;  . APPENDECTOMY    . ATHERECTOMY N/A 06/04/2011   Procedure: ATHERECTOMY;  Surgeon: Runell Gess, MD;  Location: [facility] CATH LAB;  Service: Cardiovascular;  Laterality: N/A;  . CARDIAC CATHETERIZATION  10/13/2009   95% stenosis in the AV groove circumflex and 95% ostial stenosis in small OM3. A 3x77mm drug-eluting Promus stent inserted ito the circumflex. Dilatated with a 3.25x24mm noncompliant Quantum balloon within entire segment. The entire region was reduced to 0%  and brisk TIMI3 flow.  . CAROTID DUPLEX  03/19/2011   Right ICA-demonstrates complete occlusion. Left ICA-demonstrates a small amount of fibrous plaque.  Marland Kitchen CATARACT EXTRACTION W/ INTRAOCULAR LENS IMPLANT Right   . CESAREAN SECTION  1990  . CORONARY ANGIOPLASTY    . ENDARTERECTOMY FEMORAL Left 09/05/2015   Procedure: ENDARTERECTOMY FEMORAL WITH PROFUNDOPLASTY;  Surgeon: Chuck Hint, MD;  Location: [facility] OR;  Service: Vascular;  Laterality: Left;  Left common femoral artery vein patch using left saphenous vien  . ENDARTERECTOMY FEMORAL Right 10/22/2016   Procedure: ENDARTERECTOMY RIGHT COMMON FEMORAL;  Surgeon: Chuck Hint, MD;  Location: [facility] OR;  Service: Vascular;  Laterality: Right;  . FEMORAL-POPLITEAL BYPASS GRAFT Left 11/02/2015   Procedure: BYPASS GRAFT FEMORAL-POPLITEAL ARTERY VS FEMORAL-TIBIAL ARTERY BYPASS;  Surgeon: Chuck Hint, MD;  Location: [facility] OR;  Service: Vascular;  Laterality: Left;  . FEMORAL-POPLITEAL BYPASS GRAFT Right 10/22/2016   Procedure: BYPASS GRAFT RIGHT FEMORAL- BELOW KNEE POPLITEAL ARTERY WITH VEIN;  Surgeon: Chuck Hint, MD;  Location: [facility] OR;  Service: Vascular;  Laterality: Right;  . I&D EXTREMITY Left 11/10/2015   Procedure: Debridement Left Foot Ulcer, Application  Wound VAC;  Surgeon: [name], MD;  Location: [facility] OR;  Service: Orthopedics;  Laterality: Left;  . ILIAC ARTERY STENT Left 08/28/2015   common  . INTRAOPERATIVE ARTERIOGRAM Left 09/05/2015   Procedure: INTRA OPERATIVE ARTERIOGRAM;  Surgeon: Chuck Hint, MD;  Location: [facility] OR;  Service: Vascular;  Laterality: Left;  . INTRAOPERATIVE ARTERIOGRAM Left 11/02/2015   Procedure: INTRA OPERATIVE ARTERIOGRAM;  Surgeon: Chuck Hint, MD;  Location: [facility] OR;  Service: Vascular;  Laterality: Left;  . INTRAOPERATIVE ARTERIOGRAM Right 10/22/2016   Procedure: INTRA OPERATIVE ARTERIOGRAM;  Surgeon: Chuck Hint, MD;  Location: [facility] OR;  Service: Vascular;   Laterality: Right;  . LEXISCAN MYOVIEW  10/25/2010   Moderate perfusion defect due to infarct/scar with mild perinfarct ischemia seen in the Basal Inferolateral, Basal Anterolateral, Mid Inferolateral, and Mid Anterolateral regions. Post-stress EF is 50%.  . LOWER EXTREMITY ANGIOGRAPHY N/A 10/17/2016   Procedure: Lower Extremity Angiography;  Surgeon: Runell Gess, MD;  Location: [facility] INVASIVE CV LAB;  Service: Cardiovascular;  Laterality: N/A;  . OVARY SURGERY  1983?   "ruptured"  . PERIPHERAL VASCULAR ANGIOGRAM  01/26/2010   High-grade SFA disease: left greater than right. Left SFA would require fem-pop bypass grafting. Right SFA could be stented but might require Diamondback Orbital atherectomy.  Marland Kitchen PERIPHERAL VASCULAR ANGIOGRAM  02/23/2010   Stealth Predator orbital rotational atherectomy performed on SFA & Popliteal up to 90,000 RPM. Stenting using overlapping 5x16mm and 5x20mm Absolute Pro Nitinol self-expanding stents beginning just at the knee up to the mid SFA resulting in reduction of 90-95% calcified SFA & Popliteal stenosis to 0. Stenting performed on the distal common & proximal iliac artery with a 10x4 Absolute Pro- 70-0%.  Marland Kitchen PERIPHERAL VASCULAR ANGIOGRAM  06/17/2010   PTA performed to the  right external iliac artery stent using a 5x100 balloon at 10 atmospheres. Stenting performed using a 6x18 Genesis on Opta balloon. Postdilatation with a 7x2 balloon resulting in a 95% "in-stent" stenosis to 0% residual.  . PERIPHERAL VASCULAR ANGIOGRAM  06/04/2011   Bilateral total SFAs not percutaneously addressable. Good canidate for femoropopliteal bypass grafting  . PERIPHERAL VASCULAR ANGIOGRAM  08/28/2015  . PERIPHERAL VASCULAR CATHETERIZATION N/A 08/28/2015   Procedure: Lower Extremity Angiography;  Surgeon: Runell Gess, MD;  Location: [facility] INVASIVE CV LAB;  Service: Cardiovascular;  Laterality: N/A;  . PERIPHERAL VASCULAR CATHETERIZATION N/A 08/28/2015   Procedure: Abdominal Aortogram;   Surgeon: Runell Gess, MD;  Location: [facility] INVASIVE CV LAB;  Service: Cardiovascular;  Laterality: N/A;  . PERIPHERAL VASCULAR CATHETERIZATION Left 08/28/2015   Procedure: Peripheral Vascular Intervention;  Surgeon: Runell Gess, MD;  Location: [facility] INVASIVE CV LAB;  Service: Cardiovascular;  Laterality: Left;  common iliac  . PERIPHERAL VASCULAR CATHETERIZATION Left 08/28/2015   Procedure: Peripheral Vascular Atherectomy;  Surgeon: Runell Gess, MD;  Location: [facility] INVASIVE CV LAB;  Service: Cardiovascular;  Laterality: Left;  common iliac  . PERIPHERAL VASCULAR CATHETERIZATION N/A 09/28/2015   Procedure: Lower Extremity Angiography;  Surgeon: Runell Gess, MD;  Location: [facility] INVASIVE CV LAB;  Service: Cardiovascular;  Laterality: N/A;  . PERIPHERAL VASCULAR CATHETERIZATION Left 09/28/2015   Procedure: Peripheral Vascular Intervention;  Surgeon: Runell Gess, MD;  Location: [facility] INVASIVE CV LAB;  Service: Cardiovascular;  Laterality: Left CFA  PCI with 9 mm x 4 cm Abbott nitinol absolute Pro self-expanding stent     . SKIN SPLIT GRAFT Left 12/01/2015   Procedure: LEFT FOOT SKIN GRAFT AND VAC;  Surgeon: [name], MD;  Location: [facility] OR;  Service: Orthopedics;  Laterality: Left;  . THROMBECTOMY FEMORAL ARTERY Right 10/22/2016   Procedure: THROMBECTOMY FEMORAL ARTERY;  Surgeon: Chuck Hint, MD;  Location: [facility] OR;  Service: Vascular;  Laterality: Right;  . TRANSTHORACIC ECHOCARDIOGRAM  10/17/2009   EF 45-50%, moderate hypokinesis of the entire inferolateral myocardium, mild concentric hypertrophy and mild regurg of the mitral valva.  Marland Kitchen VEIN HARVEST Left 11/02/2015   Procedure: LEFT GREATER SAPHENOUS VEIN HARVEST;  Surgeon: Chuck Hint, MD;  Location: [facility] OR;  Service: Vascular;  Laterality: Left;     A IV Location/Drains/Wounds Patient Lines/Drains/Airways Status   Active Line/Drains/Airways    Name:   Placement date:   Placement time:   Site:   Days:   Peripheral IV  10/27/16 Left;Anterior Forearm   10/27/16    2319    Forearm   558   Peripheral IV 02/18/18 Right;Anterior;Distal Forearm   02/18/18    0720    Forearm   79   Peripheral IV 05/08/18 Right Antecubital   05/08/18    0748    Antecubital   less than 1   Peripheral IV 05/08/18 Left Wrist   05/08/18    0853    Wrist   less than 1   Negative Pressure Wound Therapy Foot Left;Posterior   11/10/15    1516    -   910   External Urinary Catheter   05/08/18    0801    -   less than 1   Incision (Closed) 09/05/15 Leg Left   09/05/15    1151     976   Incision (Closed) 11/02/15 Groin Left   11/02/15    1441     918   Incision (Closed) 11/02/15 Thigh Left  11/02/15    1441     918   Incision (Closed) 11/02/15 Other (Comment) Left   11/02/15    1441     918   Incision (Closed) 11/10/15 Leg Left   11/10/15    1531     910   Incision (Closed) 12/01/15 Leg Left   12/01/15    1635     889   Incision (Closed) 10/22/16 Leg Right   10/22/16    0838     563   Incision (Closed) 10/22/16 Groin Right   10/22/16    0838     563   Incision (Closed) 10/22/16 Foot Right   10/22/16    0839     563   Wound / Incision (Open or Dehisced) 11/06/15 Other (Comment) Sacrum Left red 2x2 MASD area    11/06/15    0800    Sacrum   914   Wound / Incision (Open or Dehisced) 10/17/16 Diabetic ulcer Leg Right calf   10/17/16    1330    Leg   568   Wound / Incision (Open or Dehisced) 10/17/16 Other (Comment) Toe (Comment  which one) Right gangrene and some necrosis   10/17/16    1330    Toe (Comment  which one)   568   Wound / Incision (Open or Dehisced) 10/17/16 Diabetic ulcer Foot Left healing ulcerated area   10/17/16    1330    Foot   568          Intake/Output Last 24 hours No intake or output data in the 24 hours ending 05/08/18 1610  Labs/Imaging Results for orders placed or performed during the hospital encounter of 05/08/18 (from the past 48 hour(s))  Brain natriuretic peptide     Status: Abnormal   Collection Time: 05/08/18   7:20 AM  Result Value Ref Range   B Natriuretic Peptide 624.8 (H) 0.0 - 100.0 pg/mL    Comment: Performed at [facility], [address] [place], [place] [address]  Basic metabolic panel     Status: Abnormal   Collection Time: 05/08/18  7:20 AM  Result Value Ref Range   Sodium 133 (L) 135 - 145 mmol/L   Potassium 5.3 (H) 3.5 - 5.1 mmol/L   Chloride 100 98 - 111 mmol/L   CO2 18 (L) 22 - 32 mmol/L   Glucose, Bld 385 (H) 70 - 99 mg/dL   BUN 29 (H) 8 - 23 mg/dL   Creatinine, Ser 4.09 (H) 0.44 - 1.00 mg/dL   Calcium 9.3 8.9 - 81.1 mg/dL   GFR calc non Af Amer 36 (L) >60 mL/min   GFR calc Af Amer 42 (L) >60 mL/min   Anion gap 15 5 - 15    Comment: Performed at [facility], [address], [place], [place] [address]  Troponin I - ONCE - STAT     Status: Abnormal   Collection Time: 05/08/18  7:20 AM  Result Value Ref Range   Troponin I 0.09 (HH) <0.03 ng/mL    Comment: CRITICAL RESULT CALLED TO, READ BACK BY AND VERIFIED WITH: Penni Bombard RN [phone] [id number] BY A BENNETT Performed at [facility], [address] [place], [place] [address]    Dg Chest Portable 1 View  Result Date: 05/08/2018 CLINICAL DATA:  Shortness of breath. Nausea. Swelling of the lower extremities. EXAM: PORTABLE CHEST 1 VIEW COMPARISON:  02/19/2018 FINDINGS: Artifact overlies the chest. Chronic cardiomegaly and  aortic atherosclerosis. Pulmonary venous hypertension with interstitial edema. No visible effusion. IMPRESSION: Congestive heart failure with cardiomegaly and interstitial edema. Electronically Signed   By: Paulina Fusi M.D.   On: 05/08/2018 08:11    Pending Labs Unresulted Labs (From admission, onward)    Start     Ordered   05/08/18 0720  CBC with Differential  ONCE - STAT,   STAT     05/08/18 0720   Signed and Held  CBC  (heparin)  Once,   R    Comments:  Baseline for heparin therapy IF NOT ALREADY DRAWN.  Notify MD if PLT < 100 K.    Signed and Held   Signed and Held  Creatinine, serum   (heparin)  Once,   R    Comments:  Baseline for heparin therapy IF NOT ALREADY DRAWN.    Signed and Held   Signed and Held  CBC  Tomorrow morning,   R     Signed and Held   Signed and Held  Basic metabolic panel  Tomorrow morning,   R     Signed and Held   Signed and Held  Troponin I - Now Then Q6H  Now then every 6 hours,   R     Signed and Held          Vitals/Pain Today's Vitals   05/08/18 0716 05/08/18 0730 05/08/18 0800 05/08/18 0830  BP:  (!) 174/96 (!) 193/108 (!) 174/92  Pulse:  (!) 117 (!) 122 (!) 108  Resp:  (!) 32 (!) 36 (!) 24  Temp:      TempSrc:      SpO2:  95% 94% 97%  Weight: 81.6 kg     Height: 5\' 4"  (1.626 m)     PainSc: 0-No pain       Isolation Precautions No active isolations  Medications Medications  nitroGLYCERIN 50 mg in dextrose 5 % 250 mL (0.2 mg/mL) infusion (5 mcg/min Intravenous New Bag/Given 05/08/18 0752)  furosemide (LASIX) injection 40 mg (40 mg Intravenous Given 05/08/18 0737)  potassium chloride SA (K-DUR,KLOR-CON) CR tablet 40 mEq (40 mEq Oral Given 05/08/18 0737)  furosemide (LASIX) injection 40 mg (40 mg Intravenous Given 05/08/18 0854)  aspirin chewable tablet 324 mg (324 mg Oral Given 05/08/18 0908)    Mobility {Mobility:walks Moderate fall risk   Focused Assessments    R Recommendations: See Admitting Provider Note  Report given to:   Additional Notes:

## 2018-05-09 ENCOUNTER — Inpatient Hospital Stay (HOSPITAL_COMMUNITY): Payer: Medicare Other

## 2018-05-09 DIAGNOSIS — N183 Chronic kidney disease, stage 3 (moderate): Secondary | ICD-10-CM

## 2018-05-09 DIAGNOSIS — E785 Hyperlipidemia, unspecified: Secondary | ICD-10-CM

## 2018-05-09 DIAGNOSIS — E1165 Type 2 diabetes mellitus with hyperglycemia: Secondary | ICD-10-CM

## 2018-05-09 DIAGNOSIS — N179 Acute kidney failure, unspecified: Secondary | ICD-10-CM

## 2018-05-09 DIAGNOSIS — J9601 Acute respiratory failure with hypoxia: Secondary | ICD-10-CM

## 2018-05-09 DIAGNOSIS — R0602 Shortness of breath: Secondary | ICD-10-CM

## 2018-05-09 LAB — BASIC METABOLIC PANEL
Anion gap: 14 (ref 5–15)
BUN: 37 mg/dL — ABNORMAL HIGH (ref 8–23)
CO2: 20 mmol/L — ABNORMAL LOW (ref 22–32)
Calcium: 8.4 mg/dL — ABNORMAL LOW (ref 8.9–10.3)
Chloride: 103 mmol/L (ref 98–111)
Creatinine, Ser: 2.07 mg/dL — ABNORMAL HIGH (ref 0.44–1.00)
GFR calc Af Amer: 28 mL/min — ABNORMAL LOW (ref >60)
GFR calc non Af Amer: 24 mL/min — ABNORMAL LOW (ref >60)
Glucose, Bld: 127 mg/dL — ABNORMAL HIGH (ref 70–99)
Potassium: 4.3 mmol/L (ref 3.5–5.1)
Sodium: 137 mmol/L (ref 135–145)

## 2018-05-09 LAB — PLATELET INHIBITION P2Y12: Platelet Function  P2Y12: 383 [PRU] — ABNORMAL HIGH (ref 182–335)

## 2018-05-09 LAB — ECHOCARDIOGRAM LIMITED
Height: 64 in
Weight: 2895.96 oz

## 2018-05-09 LAB — TROPONIN I
Troponin I: 7.09 ng/mL (ref <0.03)
Troponin I: 3.72 ng/mL (ref <0.03)

## 2018-05-09 LAB — GLUCOSE, CAPILLARY
Glucose-Capillary: 122 mg/dL — ABNORMAL HIGH (ref 70–99)
Glucose-Capillary: 250 mg/dL — ABNORMAL HIGH (ref 70–99)
Glucose-Capillary: 209 mg/dL — ABNORMAL HIGH (ref 70–99)
Glucose-Capillary: 110 mg/dL — ABNORMAL HIGH (ref 70–99)

## 2018-05-09 LAB — CBC
HCT: 30 % — ABNORMAL LOW (ref 36.0–46.0)
Hemoglobin: 9.8 g/dL — ABNORMAL LOW (ref 12.0–15.0)
MCH: 27.8 pg (ref 26.0–34.0)
MCHC: 32.7 g/dL (ref 30.0–36.0)
MCV: 85 fL (ref 80.0–100.0)
Platelets: 232 10*3/uL (ref 150–400)
RBC: 3.53 MIL/uL — ABNORMAL LOW (ref 3.87–5.11)
RDW: 13.8 % (ref 11.5–15.5)
WBC: 9.3 10*3/uL (ref 4.0–10.5)
nRBC: 0 % (ref 0.0–0.2)

## 2018-05-09 LAB — HEPARIN LEVEL (UNFRACTIONATED)
Heparin Unfractionated: 0.33 IU/mL (ref 0.30–0.70)
Heparin Unfractionated: 0.31 IU/mL (ref 0.30–0.70)

## 2018-05-09 MED ORDER — SODIUM CHLORIDE 0.9 % IV BOLUS
500.0000 mL | Freq: Once | INTRAVENOUS | Status: AC
  Administered 2018-05-09: 02:00:00 500 mL via INTRAVENOUS

## 2018-05-09 NOTE — Progress Notes (Signed)
Progress Note  Patient Name: [name] [name] Date of Encounter: 05/09/2018  Primary Cardiologist: Nicki Guadalajara, MD   Subjective   Currently feeling well without chest pain or shortness of breath.  Inpatient Medications    Scheduled Meds:  aspirin EC  81 mg Oral Daily   atorvastatin  80 mg Oral q1800   clopidogrel  75 mg Oral Daily   furosemide  20 mg Intravenous BID   gabapentin  300 mg Oral TID   insulin aspart  0-5 Units Subcutaneous QHS   insulin aspart  0-9 Units Subcutaneous TID WC   insulin glargine  35 Units Subcutaneous Daily   isosorbide mononitrate  90 mg Oral Daily   losartan  100 mg Oral Daily   metoprolol succinate  75 mg Oral Daily   pantoprazole  40 mg Oral Daily   sodium chloride flush  3 mL Intravenous Q12H   Continuous Infusions:  sodium chloride     heparin 900 Units/hr (05/09/18 0500)   nitroGLYCERIN Stopped (05/09/18 0110)   PRN Meds: sodium chloride, acetaminophen **OR** acetaminophen, ondansetron **OR** ondansetron (ZOFRAN) IV, oxyCODONE, sodium chloride flush   Vital Signs    Vitals:   05/09/18 0200 05/09/18 0322 05/09/18 0554 05/09/18 0729  BP: (!) 92/54 (!) 98/49  100/78  Pulse: 66 70  85  Resp: 16 16  19   Temp:  97.6 F (36.4 C)  98.3 F (36.8 C)  TempSrc:  Axillary  Oral  SpO2: 98% 97%  99%  Weight:   82.1 kg   Height:        Intake/Output Summary (Last 24 hours) at 05/09/2018 1011 Last data filed at 05/09/2018 0500 Gross per 24 hour  Intake 868.39 ml  Output 400 ml  Net 468.39 ml   Last 3 Weights 05/09/2018 05/08/2018 02/21/2018  Weight (lbs) 181 lb 180 lb 194 lb 4.8 oz  Weight (kg) 82.1 kg 81.647 kg 88.134 kg      Telemetry    Sinus rhythm- Personally Reviewed  ECG    Sinus rhythm, anterolateral T wave inversions- Personally Reviewed  Physical Exam   GEN: No acute distress.   Neck: No JVD Cardiac: RRR, no murmurs, rubs, or gallops.  Respiratory: Clear to auscultation bilaterally. GI: Soft,  nontender, non-distended  MS: No edema; No deformity. Neuro:  Nonfocal  Psych: Normal affect   Labs    Chemistry Recent Labs  Lab 05/08/18 0720 05/08/18 1121 05/09/18 0450  NA 133*  --  137  K 5.3*  --  4.3  CL 100  --  103  CO2 18*  --  20*  GLUCOSE 385*  --  127*  BUN 29*  --  37*  CREATININE 1.48* 1.30* 2.07*  CALCIUM 9.3  --  8.4*  GFRNONAA 36* 42* 24*  GFRAA 42* 49* 28*  ANIONGAP 15  --  14     Hematology Recent Labs  Lab 05/08/18 1121 05/09/18 0450  WBC 12.2* 9.3  RBC 4.28 3.53*  HGB 12.0 9.8*  HCT 35.3* 30.0*  MCV 82.5 85.0  MCH 28.0 27.8  MCHC 34.0 32.7  RDW 13.8 13.8  PLT 261 232    Cardiac Enzymes Recent Labs  Lab 05/08/18 1121 05/08/18 1726 05/08/18 2232 05/09/18 0450  TROPONINI 1.31* 9.92* 7.63* 7.09*   No results for input(s): TROPIPOC in the last 168 hours.   BNP Recent Labs  Lab 05/08/18 0720  BNP 624.8*     DDimer No results for input(s): DDIMER in the last 168 hours.  Radiology    Dg Chest Portable 1 View  Result Date: 05/08/2018 CLINICAL DATA:  Shortness of breath. Nausea. Swelling of the lower extremities. EXAM: PORTABLE CHEST 1 VIEW COMPARISON:  02/19/2018 FINDINGS: Artifact overlies the chest. Chronic cardiomegaly and aortic atherosclerosis. Pulmonary venous hypertension with interstitial edema. No visible effusion. IMPRESSION: Congestive heart failure with cardiomegaly and interstitial edema. Electronically Signed   By: Paulina Fusi M.D.   On: 05/08/2018 08:11    Cardiac Studies   TTE 02/13/18 - Left ventricle: The cavity size was normal. Wall thickness was   normal. The estimated ejection fraction was 45%. Diffuse   hypokinesis. Features are consistent with a pseudonormal left   ventricular filling pattern, with concomitant abnormal relaxation   and increased filling pressure (grade 2 diastolic dysfunction). - Aortic valve: There was no stenosis. - Mitral valve: Mildly calcified annulus. There was no significant    regurgitation. - Left atrium: The atrium was mildly dilated. - Right ventricle: The cavity size was normal. Systolic function   was normal. - Pulmonary arteries: No complete TR doppler jet so unable to   estimate PA systolic pressure. - Systemic veins: IVC measured 2.5 cm with > 50% respirophasic   variation, suggesting RA pressure 8 mmHg. - Pericardium, extracardiac: A trivial pericardial effusion was   identified.  Patient Profile     68 y.o. female with a history of coronary artery disease and PVD as well as chronic combined systolic and diastolic heart failure, type 2 diabetes, hypertension, hyperlipidemia, CKD, and prior stroke who presented to the hospital with shortness of breath found to have non-STEMI and acute on chronic systolic and diastolic heart failure.  Assessment & Plan    1.  Non-STEMI/coronary artery disease: Troponin peaked at 9.92.  Currently on IV heparin and IV nitroglycerin.  Plan for left heart catheterization on Monday unless she has further episodes of chest pain.  2.  Acute on chronic combined systolic and diastolic heart failure:Currently not positive.  Her heart failure exacerbation could have been due to stunning from her MI as well as hypertension.  She Jerrye Seebeck likely need continued IV diuresis, though with her increasing creatinine, Valiant Dills hold off today.  3.  Hypertension: Better controlled with IV nitroglycerin.  Continue home medications.  4.  Hyperlipidemia: Goal LDL less than 70.  Continue statin.  Dayten Juba check fasting lipids tomorrow.  5.  Acute renal failure with CKD stage III: We [place] hold off on diuresis today to see if her creatinine improves.  Presenting creatinine was right around baseline, though creatinine has increased.      For questions or updates, please contact [facility] Please consult [email or web address] for contact info under        Signed, Jahmire Ruffins Jorja Loa, MD  05/09/2018, 10:11 AM

## 2018-05-09 NOTE — Progress Notes (Addendum)
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  She denies any chest pain but apparently had 2 episodes of vomiting prior to admission.  Work-up in the ED revealed elevated BNP at 624 and chest x-ray showing pulmonary edema.  She was hypertensive on presentation with systolic blood pressure in 170s, hypoxia requiring 2 L nasal cannula.  She was given 2 doses of IV Lasix and initiated on heparin/nitro drip after her troponins were noted to be elevated (troponin overnight peaked to 9).  Cardiology evaluated patient and plan for cardiac cath on Monday.  Subjective: Nurse reports that patient had hypotension overnight on nitro drip and required 500 mL of fluid bolus.  SBP fluctuating between 100-1 20 when seen on rounds this morning.  Patient denies any chest pain and feels better in terms of breathing.  Objective: Vitals:   05/09/18 0554 05/09/18 0729 05/09/18 1035 05/09/18 1111  BP:  100/78 117/67 (!) 141/56  Pulse:  85 77 73  Resp:  19  15  Temp:  98.3 F (36.8 C)  97.6 F (36.4 C)  TempSrc:  Oral  Oral  SpO2:  99%  96%  Weight: 82.1 kg     Height:        Intake/Output Summary (Last 24 hours) at 05/09/2018 1354 Last data filed at 05/09/2018 1100 Gross per 24 hour  Intake 1108.39 ml  Output 900 ml  Net 208.39 ml   Filed Weights   05/08/18 0716 05/09/18 0554  Weight: 81.6 kg 82.1 kg    Physical Examination:  General exam: Appears calm and comfortable  Respiratory system: Clear to auscultation. Respiratory effort normal. Cardiovascular system: S1 & S2 heard, RRR. No JVD, murmurs, rubs,  gallops or clicks. No leg edema noted this morning. Gastrointestinal system: Abdomen is nondistended, soft and nontender. No organomegaly or masses felt. Normal bowel sounds heard. Central nervous system: Alert and oriented. No focal neurological deficits. Extremities: Symmetric 5 x 5 power. Skin: No rashes, lesions or ulcers Psychiatry: Judgement and insight appear normal. Mood & affect appropriate.     Data Reviewed: I have personally reviewed following labs and imaging studies  CBC: Recent Labs  Lab 05/08/18 1121 05/09/18 0450  WBC 12.2* 9.3  NEUTROABS 10.6*  --   HGB 12.0 9.8*  HCT 35.3* 30.0*  MCV 82.5 85.0  PLT 261 232   Basic Metabolic Panel: Recent Labs  Lab 05/08/18 0720 05/08/18 1121 05/09/18 0450  NA 133*  --  137  K 5.3*  --  4.3  CL 100  --  103  CO2 18*  --  20*  GLUCOSE 385*  --  127*  BUN 29*  --  37*  CREATININE 1.48* 1.30* 2.07*  CALCIUM 9.3  --  8.4*   GFR: Estimated Creatinine Clearance: 27 mL/min (A) (by C-G formula based on SCr of 2.07 mg/dL (H)). Liver Function Tests: No results for input(s): AST, ALT, ALKPHOS, BILITOT, PROT, ALBUMIN in the last 168 hours. No results for input(s): LIPASE, AMYLASE in the last 168 hours. No results for input(s): AMMONIA in the last 168 hours. Coagulation Profile: No results for input(s): INR, PROTIME in the  last 168 hours. Cardiac Enzymes: Recent Labs  Lab 05/08/18 1121 05/08/18 1726 05/08/18 2232 05/09/18 0450 05/09/18 1134  TROPONINI 1.31* 9.92* 7.63* 7.09* 3.72*   BNP (last 3 results) No results for input(s): PROBNP in the last 8760 hours. HbA1C: No results for input(s): HGBA1C in the last 72 hours. CBG: Recent Labs  Lab 05/08/18 1131 05/08/18 1553 05/08/18 2112 05/09/18 0547 05/09/18 1108  GLUCAP 282* 226* 194* 122* 250*   Lipid Profile: No results for input(s): CHOL, HDL, LDLCALC, TRIG, CHOLHDL, LDLDIRECT in the last 72 hours. Thyroid Function Tests: No results for input(s): TSH,  T4TOTAL, FREET4, T3FREE, THYROIDAB in the last 72 hours. Anemia Panel: No results for input(s): VITAMINB12, FOLATE, FERRITIN, TIBC, IRON, RETICCTPCT in the last 72 hours. Sepsis Labs: No results for input(s): PROCALCITON, LATICACIDVEN in the last 168 hours.  Recent Results (from the past 240 hour(s))  MRSA PCR Screening     Status: None   Collection Time: 05/08/18 11:52 AM  Result Value Ref Range Status   MRSA by PCR NEGATIVE NEGATIVE Final    Comment:        The GeneXpert MRSA Assay (FDA approved for NASAL specimens only), is one component of a comprehensive MRSA colonization surveillance program. It is not intended to diagnose MRSA infection nor to guide or monitor treatment for MRSA infections. Performed at [facility], [address] [place], [place] [address]       Radiology Studies: Dg Chest Portable 1 View  Result Date: 05/08/2018 CLINICAL DATA:  Shortness of breath. Nausea. Swelling of the lower extremities. EXAM: PORTABLE CHEST 1 VIEW COMPARISON:  02/19/2018 FINDINGS: Artifact overlies the chest. Chronic cardiomegaly and aortic atherosclerosis. Pulmonary venous hypertension with interstitial edema. No visible effusion. IMPRESSION: Congestive heart failure with cardiomegaly and interstitial edema. Electronically Signed   By: Paulina Fusi M.D.   On: 05/08/2018 08:11        Scheduled Meds: . aspirin EC  81 mg Oral Daily  . atorvastatin  80 mg Oral q1800  . clopidogrel  75 mg Oral Daily  . gabapentin  300 mg Oral TID  . insulin aspart  0-5 Units Subcutaneous QHS  . insulin aspart  0-9 Units Subcutaneous TID WC  . insulin glargine  35 Units Subcutaneous Daily  . isosorbide mononitrate  90 mg Oral Daily  . losartan  100 mg Oral Daily  . metoprolol succinate  75 mg Oral Daily  . pantoprazole  40 mg Oral Daily  . sodium chloride flush  3 mL Intravenous Q12H   Continuous Infusions: . sodium chloride    . heparin 900 Units/hr (05/09/18 0500)  .  nitroGLYCERIN Stopped (05/09/18 0110)    Assessment & Plan:    1.  Acute hypoxic respiratory failure POA secondary to acute on chronic combined systolic/diastolic CHF: Patient diuresed overnight.  Feels better this morning.  O2 to be titrated to off as tolerated.  Monitor I's and O's and daily weights.  Repeat echo shows similar EF as before at 45% and moderate pulmonary hypertension.  Hold oral nitrates as patient on IV nitro drip and in view of hypotension.  Hold losartan in view of AKI.  Continue  beta-blockers and nitro drip.  Holding diuretics for today per cardiology in view of AKI/plan for cardiac cath and improved respiratory status.  2.  Non-STEMI: Patient denies any chest pain but troponin peaked to 9 overnight.  Seen by cardiology and plan for cardiac cath on Monday.  Hopefully renal function will improve to  tolerate cath dye.  Continue aspirin/Plavix/statin/beta blockers, Nitro and heparin drip.  3.  AKI on chronic kidney disease stage II: Creatinine peaked up to 2 today.  Could be prerenal in the setting of aggressive diuresis versus ATN in the setting of significant and rapid blood pressure drop/fluctuations.  Hold diuretics and ACE inhibitors.  Repeat labs in a.m. Patient did receive fluid bolus yesterday in the setting of hypotension.  Will consult nephrology in a.m. if creatinine continues to worsen.  4.  Hypertension/hypotension: Hypotension overnight secondary to vasodilators and aggressive diuresis.  Hold Imdur as patient on IV nitro drip.  Hold losartan and diuretics as discussed above.  Hydralazine PRN if blood pressure spikes.  5.  Hyperlipidemia: Resume statins  6.  Diabetes mellitus: On Lantus insulin and sliding scale.  Watch for hypoglycemia given renal insufficiency and use of long-acting insulin.  7.GERD: PPI   DVT prophylaxis: Heparin drip Code Status: Full code Family / Patient Communication: Discussed with patient Disposition Plan: Home when medically cleared      LOS: 1 day    Time spent:     Alessandra Bevels, MD [facility] Pager [phone]  If 7PM-7AM, please contact night-coverage [email or web address] Password [facility] 05/09/2018, 1:54 PM

## 2018-05-09 NOTE — Progress Notes (Signed)
Patient monitor noted bradycardia down to 30's. Patient returned to 60's and is now sustaining.  Patient sleeping comfortably and not symptomatic. Patient is drowsy, but alert and oriented when awakened. Blood pressures taken and found to be 76/38. Nitro turned off. Bodenheimer notified and orders received for 500 mL bolus of NS and infusion of 250 ml/hr of NS. Patient 02 sats 97% and greater on 2L via nasal cannula. Will continue to monitor patient closely.  Patient remains very drowsy.

## 2018-05-09 NOTE — Progress Notes (Signed)
  Echocardiogram 2D Echocardiogram has been performed.  [name] [name] 05/09/2018, 10:22 AM

## 2018-05-09 NOTE — Progress Notes (Signed)
ANTICOAGULATION CONSULT NOTE  Pharmacy Consult for heparin Indication: chest pain/ACS  Allergies  Allergen Reactions  . Doxycycline Other (See Comments)    lethargy  . Hydrochlorothiazide Other (See Comments)    lethargic   . Latex Rash  . Penicillins Swelling and Rash    Pt states she has tolerated Keflex in the past without problems. States she may have tolerated Augmentin in the past but it caused GI upset. Has patient had a PCN reaction causing immediate rash, facial/tongue/throat swelling, SOB or lightheadedness with hypotension: Yes Has patient had a PCN reaction causing severe rash involving mucus membranes or skin necrosis: No Has patient had a PCN reaction that required hospitalization No Has patient had a PCN reaction occurring within the last 10 years: No    Patient Measurements: Height: 5\' 4"  (162.6 cm) Weight: 181 lb (82.1 kg) IBW/kg (Calculated) : 54.7 Heparin Dosing Weight: 72kg  Vital Signs: Temp: 97.6 F (36.4 C) (04/11 0322) Temp Source: Axillary (04/11 0322) BP: 98/49 (04/11 0322) Pulse Rate: 70 (04/11 0322)  Labs: Recent Labs    05/08/18 0720 05/08/18 1121 05/08/18 1726 05/08/18 2232 05/09/18 0450  HGB  --  12.0  --   --   --   HCT  --  35.3*  --   --   --   PLT  --  261  --   --   --   HEPARINUNFRC  --   --   --   --  0.33  CREATININE 1.48* 1.30*  --   --   --   TROPONINI 0.09* 1.31* 9.92* 7.63*  --     Estimated Creatinine Clearance: 43 mL/min (A) (by C-G formula based on SCr of 1.3 mg/dL (H)).  Assessment: 68 y.o. female with elevated cardiac markers for heparin  Goal of Therapy:  Heparin level 0.3-0.7 units/ml Monitor platelets by anticoagulation protocol: Yes   Plan:  Continue Heparin at current rate for now Recheck heparin level in 6 hours to verify  Geannie Risen, PharmD, BCPS  05/09/2018

## 2018-05-09 NOTE — Progress Notes (Signed)
ANTICOAGULATION CONSULT NOTE  Pharmacy Consult for heparin Indication: chest pain/ACS  Allergies  Allergen Reactions  . Doxycycline Other (See Comments)    lethargy  . Hydrochlorothiazide Other (See Comments)    lethargic   . Latex Rash  . Penicillins Swelling and Rash    Pt states she has tolerated Keflex in the past without problems. States she may have tolerated Augmentin in the past but it caused GI upset. Has patient had a PCN reaction causing immediate rash, facial/tongue/throat swelling, SOB or lightheadedness with hypotension: Yes Has patient had a PCN reaction causing severe rash involving mucus membranes or skin necrosis: No Has patient had a PCN reaction that required hospitalization No Has patient had a PCN reaction occurring within the last 10 years: No    Patient Measurements: Height: 5\' 4"  (162.6 cm) Weight: 181 lb (82.1 kg) IBW/kg (Calculated) : 54.7 Heparin Dosing Weight: 72kg  Vital Signs: Temp: 97.6 F (36.4 C) (04/11 1111) Temp Source: Oral (04/11 1111) BP: 141/56 (04/11 1111) Pulse Rate: 73 (04/11 1111)  Labs: Recent Labs    05/08/18 0720 05/08/18 1121  05/08/18 2232 05/09/18 0450 05/09/18 1134  HGB  --  12.0  --   --  9.8*  --   HCT  --  35.3*  --   --  30.0*  --   PLT  --  261  --   --  232  --   HEPARINUNFRC  --   --   --   --  0.33 0.31  CREATININE 1.48* 1.30*  --   --  2.07*  --   TROPONINI 0.09* 1.31*   < > 7.63* 7.09* 3.72*   < > = values in this interval not displayed.    Estimated Creatinine Clearance: 27 mL/min (A) (by C-G formula based on SCr of 2.07 mg/dL (H)).  Assessment: 68 y.o. female with elevated cardiac markers for heparin.  Heparin level continues to be at goal this afternoon. No bleeding issues noted.   Goal of Therapy:  Heparin level 0.3-0.7 units/ml Monitor platelets by anticoagulation protocol: Yes   Plan:  Continue Heparin at current rate for now Daily heparin level and cbc  Sheppard Coil PharmD.,  BCPS Clinical Pharmacist 05/09/2018 1:34 PM

## 2018-05-10 LAB — HEPARIN LEVEL (UNFRACTIONATED)
Heparin Unfractionated: 0.18 IU/mL — ABNORMAL LOW (ref 0.30–0.70)
Heparin Unfractionated: 0.42 IU/mL (ref 0.30–0.70)

## 2018-05-10 LAB — BASIC METABOLIC PANEL
Anion gap: 10 (ref 5–15)
BUN: 29 mg/dL — ABNORMAL HIGH (ref 8–23)
CO2: 23 mmol/L (ref 22–32)
Calcium: 8.5 mg/dL — ABNORMAL LOW (ref 8.9–10.3)
Chloride: 106 mmol/L (ref 98–111)
Creatinine, Ser: 1.39 mg/dL — ABNORMAL HIGH (ref 0.44–1.00)
GFR calc Af Amer: 45 mL/min — ABNORMAL LOW (ref >60)
GFR calc non Af Amer: 39 mL/min — ABNORMAL LOW (ref >60)
Glucose, Bld: 84 mg/dL (ref 70–99)
Potassium: 4.3 mmol/L (ref 3.5–5.1)
Sodium: 139 mmol/L (ref 135–145)

## 2018-05-10 LAB — CBC
HCT: 26.8 % — ABNORMAL LOW (ref 36.0–46.0)
Hemoglobin: 9.2 g/dL — ABNORMAL LOW (ref 12.0–15.0)
MCH: 29.1 pg (ref 26.0–34.0)
MCHC: 34.3 g/dL (ref 30.0–36.0)
MCV: 84.8 fL (ref 80.0–100.0)
Platelets: 192 10*3/uL (ref 150–400)
RBC: 3.16 MIL/uL — ABNORMAL LOW (ref 3.87–5.11)
RDW: 13.9 % (ref 11.5–15.5)
WBC: 8.5 10*3/uL (ref 4.0–10.5)
nRBC: 0 % (ref 0.0–0.2)

## 2018-05-10 LAB — GLUCOSE, CAPILLARY
Glucose-Capillary: 92 mg/dL (ref 70–99)
Glucose-Capillary: 172 mg/dL — ABNORMAL HIGH (ref 70–99)
Glucose-Capillary: 259 mg/dL — ABNORMAL HIGH (ref 70–99)
Glucose-Capillary: 287 mg/dL — ABNORMAL HIGH (ref 70–99)

## 2018-05-10 LAB — LIPID PANEL
Cholesterol: 211 mg/dL — ABNORMAL HIGH (ref 0–200)
HDL: 30 mg/dL — ABNORMAL LOW (ref >40)
LDL Cholesterol: 104 mg/dL — ABNORMAL HIGH (ref 0–99)
Total CHOL/HDL Ratio: 7 RATIO
Triglycerides: 386 mg/dL — ABNORMAL HIGH (ref <150)
VLDL: 77 mg/dL — ABNORMAL HIGH (ref 0–40)

## 2018-05-10 MED ORDER — ASPIRIN 81 MG PO CHEW
81.0000 mg | CHEWABLE_TABLET | ORAL | Status: AC
  Administered 2018-05-11: 06:00:00 81 mg via ORAL
  Filled 2018-05-10: qty 1

## 2018-05-10 MED ORDER — METOPROLOL SUCCINATE ER 100 MG PO TB24: 100.0000 mg | ORAL_TABLET | Freq: Every day | ORAL | Status: DC

## 2018-05-10 MED ORDER — SODIUM CHLORIDE 0.9 % IV SOLN
INTRAVENOUS | Status: DC
  Administered 2018-05-11: 06:00:00 via INTRAVENOUS

## 2018-05-10 MED ORDER — SODIUM CHLORIDE 0.9% FLUSH: 3.0000 mL | INTRAVENOUS | Status: DC | PRN

## 2018-05-10 MED ORDER — SODIUM CHLORIDE 0.9% FLUSH: 3.0000 mL | Freq: Two times a day (BID) | INTRAVENOUS | Status: DC

## 2018-05-10 MED ORDER — METOPROLOL SUCCINATE ER 50 MG PO TB24
75.0000 mg | ORAL_TABLET | Freq: Every day | ORAL | Status: DC
  Administered 2018-05-11 – 2018-05-13 (×3): 75 mg via ORAL
  Filled 2018-05-10 (×3): qty 1

## 2018-05-10 MED ORDER — SODIUM CHLORIDE 0.9 % IV SOLN: 250.0000 mL | INTRAVENOUS | Status: DC | PRN

## 2018-05-10 MED ORDER — SODIUM CHLORIDE 0.9 % IV SOLN: INTRAVENOUS | Status: DC

## 2018-05-10 MED ORDER — GABAPENTIN 100 MG PO CAPS
100.0000 mg | ORAL_CAPSULE | Freq: Three times a day (TID) | ORAL | Status: DC
  Administered 2018-05-10 – 2018-05-13 (×8): 100 mg via ORAL
  Filled 2018-05-10 (×8): qty 1

## 2018-05-10 MED ORDER — HYDRALAZINE HCL 20 MG/ML IJ SOLN: 5.0000 mg | Freq: Four times a day (QID) | INTRAMUSCULAR | Status: DC | PRN

## 2018-05-10 MED ORDER — HYDRALAZINE HCL 20 MG/ML IJ SOLN: 10.0000 mg | Freq: Four times a day (QID) | INTRAMUSCULAR | Status: DC | PRN

## 2018-05-10 MED ORDER — METOPROLOL SUCCINATE ER 25 MG PO TB24
25.0000 mg | ORAL_TABLET | Freq: Once | ORAL | Status: AC
  Administered 2018-05-10: 25 mg via ORAL
  Filled 2018-05-10: qty 1

## 2018-05-10 NOTE — Progress Notes (Addendum)
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  She denies any chest pain but apparently had 2 episodes of vomiting prior to admission.  Work-up in the ED revealed elevated BNP at 624 and chest x-ray showing pulmonary edema.  She was hypertensive on presentation with systolic blood pressure in 170s, hypoxia requiring 2 L nasal cannula.  She was given 2 doses of IV Lasix and initiated on heparin/nitro drip after her troponins were noted to be elevated (troponin overnight peaked to 9).  Cardiology evaluated patient and plan for cardiac cath on Monday.  Subjective: Out of bed to chair and saturating well on RA. Hypertensive today. She states she tends to have fluctuating SBPs when she is in the hospital.   Objective: Vitals:   05/10/18 1009 05/10/18 1302 05/10/18 1353 05/10/18 1640  BP: (!) 161/66 (!) 152/59 (!) 151/83 (!) 79/56  Pulse: 75 79 82   Resp:  (!) 21    Temp:  98.6 F (37 C)  98.8 F (37.1 C)  TempSrc:  Oral  Oral  SpO2:  99%    Weight:      Height:        Intake/Output Summary (Last 24 hours) at 05/10/2018 1644 Last data filed at 05/10/2018 1300 Gross per 24 hour  Intake 773.93 ml  Output --  Net 773.93 ml   Filed Weights   05/08/18 0716 05/09/18 0554 05/10/18 0400  Weight: 81.6 kg 82.1 kg 82.6 kg    Physical Examination:  General exam: Appears calm and comfortable  Respiratory system: Clear to auscultation. Respiratory effort normal. Cardiovascular system: S1 & S2 heard, RRR. No JVD, murmurs, rubs, gallops or clicks. No leg edema noted this morning. Gastrointestinal system:  Abdomen is nondistended, soft and nontender. No organomegaly or masses felt. Normal bowel sounds heard. Central nervous system: Alert and oriented. No focal neurological deficits. Extremities: Symmetric 5 x 5 power. Skin: No rashes, lesions or ulcers Psychiatry: Judgement and insight appear normal. Mood & affect appropriate.     Data Reviewed: I have personally reviewed following labs and imaging studies  CBC: Recent Labs  Lab 05/08/18 1121 05/09/18 0450 05/10/18 0231  WBC 12.2* 9.3 8.5  NEUTROABS 10.6*  --   --   HGB 12.0 9.8* 9.2*  HCT 35.3* 30.0* 26.8*  MCV 82.5 85.0 84.8  PLT 261 232 192   Basic Metabolic Panel: Recent Labs  Lab 05/08/18 0720 05/08/18 1121 05/09/18 0450 05/10/18 0231  NA 133*  --  137 139  K 5.3*  --  4.3 4.3  CL 100  --  103 106  CO2 18*  --  20* 23  GLUCOSE 385*  --  127* 84  BUN 29*  --  37* 29*  CREATININE 1.48* 1.30* 2.07* 1.39*  CALCIUM 9.3  --  8.4* 8.5*   GFR: Estimated Creatinine Clearance: 40.3 mL/min (A) (by C-G formula based on SCr of 1.39 mg/dL (H)). Liver Function Tests: No results for input(s): AST, ALT, ALKPHOS, BILITOT, PROT, ALBUMIN in the last 168 hours. No results for input(s): LIPASE, AMYLASE in the last 168 hours. No results for input(s): AMMONIA in the last 168  hours. Coagulation Profile: No results for input(s): INR, PROTIME in the last 168 hours. Cardiac Enzymes: Recent Labs  Lab 05/08/18 1121 05/08/18 1726 05/08/18 2232 05/09/18 0450 05/09/18 1134  TROPONINI 1.31* 9.92* 7.63* 7.09* 3.72*   BNP (last 3 results) No results for input(s): PROBNP in the last 8760 hours. HbA1C: No results for input(s): HGBA1C in the last 72 hours. CBG: Recent Labs  Lab 05/09/18 1108 05/09/18 1537 05/09/18 2031 05/10/18 0609 05/10/18 1059  GLUCAP 250* 209* 110* 92 172*   Lipid Profile: Recent Labs    05/10/18 0231  CHOL 211*  HDL 30*  LDLCALC 104*  TRIG 386*  CHOLHDL 7.0   Thyroid Function Tests: No results for  input(s): TSH, T4TOTAL, FREET4, T3FREE, THYROIDAB in the last 72 hours. Anemia Panel: No results for input(s): VITAMINB12, FOLATE, FERRITIN, TIBC, IRON, RETICCTPCT in the last 72 hours. Sepsis Labs: No results for input(s): PROCALCITON, LATICACIDVEN in the last 168 hours.  Recent Results (from the past 240 hour(s))  MRSA PCR Screening     Status: None   Collection Time: 05/08/18 11:52 AM  Result Value Ref Range Status   MRSA by PCR NEGATIVE NEGATIVE Final    Comment:        The GeneXpert MRSA Assay (FDA approved for NASAL specimens only), is one component of a comprehensive MRSA colonization surveillance program. It is not intended to diagnose MRSA infection nor to guide or monitor treatment for MRSA infections. Performed at [facility], [address] [place], [place] [address]       Radiology Studies: No results found.      Scheduled Meds:  aspirin EC  81 mg Oral Daily   atorvastatin  80 mg Oral q1800   gabapentin  300 mg Oral TID   insulin aspart  0-5 Units Subcutaneous QHS   insulin aspart  0-9 Units Subcutaneous TID WC   insulin glargine  35 Units Subcutaneous Daily   [START ON 05/11/2018] metoprolol succinate  100 mg Oral Daily   pantoprazole  40 mg Oral Daily   sodium chloride flush  3 mL Intravenous Q12H   Continuous Infusions:  sodium chloride     heparin 1,100 Units/hr (05/10/18 0446)   nitroGLYCERIN Stopped (05/09/18 0110)    Assessment & Plan:    1.  Acute hypoxic respiratory failure POA secondary to acute on chronic combined systolic/diastolic CHF: Patient diuresed on admission and now off diuretics in concer for hypotension/AKI.  Feels good this morning.  O2  titrated to off as tolerated.  Monitor I's and O's and daily weights.  Repeat echo shows similar EF as before at 45% and moderate pulmonary hypertension. Hold losartan in view of AKI.  Continue  beta-blockers and nitro drip.  Holding diuretics per cardiology in view of  AKI/plan for cardiac cath in am and improved respiratory status.  2.  Non-STEMI: Patient denies any chest pain but troponin peaked to 9.  Seen by cardiology and plan for cardiac cath on Monday.  Hopefully renal function will  tolerate cath dye.  Continue aspirin/Plavix/statin/beta blockers, Nitro and heparin drip.  3.  AKI on chronic kidney disease stage II: Creatinine peaked up to 2 and back to baseline today. Could be prerenal in the setting of aggressive diuresis versus ATN in the setting of significant and rapid blood pressure drop/fluctuations.  Holding diuretics and ACE inhibitors.  Patient did receive fluid bolus 4/10 in the setting of hypotension. Will avoid further hydration in concern for #1 but will need to  monitor creatinine closely post cath  4.  Hypertension/hypotension: ?secondary to vasodilators and aggressive diuresis.  Holding Imdur as patient on IV nitro drip.  Hold losartan and diuretics as discussed above.  Hydralazine PRN if blood pressure spikes.Increase metoprolol if HTN persists. She may have orthostasis given report of fluctuating SBPs at baseline. She is noted to be on high doses of neurontin (300mg  TID)  5.  Hyperlipidemia: Resume statins  6.  Diabetes mellitus: On Lantus insulin and sliding scale.  Watch for hypoglycemia given renal insufficiency and use of long-acting insulin.Half dose tonight as NPO after MN for cath in am.    7.GERD: PPI   DVT prophylaxis: Heparin drip Code Status: Full code Family / Patient Communication: Discussed with patient Disposition Plan: Home when medically cleared     LOS: 2 days    Time spent: 35 minutes    Alessandra Bevels, MD [facility] Pager [phone]  If 7PM-7AM, please contact night-coverage [email or web address] Password [facility] 05/10/2018, 4:44 PM

## 2018-05-10 NOTE — Progress Notes (Signed)
ANTICOAGULATION CONSULT NOTE  Pharmacy Consult for heparin Indication: chest pain/ACS  Allergies  Allergen Reactions  . Doxycycline Other (See Comments)    lethargy  . Hydrochlorothiazide Other (See Comments)    lethargic   . Latex Rash  . Penicillins Swelling and Rash    Pt states she has tolerated Keflex in the past without problems. States she may have tolerated Augmentin in the past but it caused GI upset. Has patient had a PCN reaction causing immediate rash, facial/tongue/throat swelling, SOB or lightheadedness with hypotension: Yes Has patient had a PCN reaction causing severe rash involving mucus membranes or skin necrosis: No Has patient had a PCN reaction that required hospitalization No Has patient had a PCN reaction occurring within the last 10 years: No    Patient Measurements: Height: 5\' 4"  (162.6 cm) Weight: 182 lb 1.6 oz (82.6 kg) IBW/kg (Calculated) : 54.7 Heparin Dosing Weight: 72kg  Vital Signs: Temp: 98 F (36.7 C) (04/12 0400) Temp Source: Oral (04/12 0400) BP: 128/61 (04/12 0400) Pulse Rate: 72 (04/11 1944)  Labs: Recent Labs    05/08/18 1121  05/08/18 2232 05/09/18 0450 05/09/18 1134 05/10/18 0231  HGB 12.0  --   --  9.8*  --  9.2*  HCT 35.3*  --   --  30.0*  --  26.8*  PLT 261  --   --  232  --  192  HEPARINUNFRC  --   --   --  0.33 0.31 0.18*  CREATININE 1.30*  --   --  2.07*  --  1.39*  TROPONINI 1.31*   < > 7.63* 7.09* 3.72*  --    < > = values in this interval not displayed.    Estimated Creatinine Clearance: 40.3 mL/min (A) (by C-G formula based on SCr of 1.39 mg/dL (H)).  Assessment: 68 y.o. female with elevated cardiac markers for heparin  Goal of Therapy:  Heparin level 0.3-0.7 units/ml Monitor platelets by anticoagulation protocol: Yes   Plan:  Increase Heparin 1100 units/hr Check heparin level in 8 hours.  Geannie Risen, PharmD, BCPS 05/10/2018

## 2018-05-10 NOTE — H&P (View-Only) (Signed)
 Progress Note  Patient Name: [name] [name] Date of Encounter: 05/10/2018  Primary Cardiologist: Thomas Kelly, MD   Subjective   Feels well, no chest pain, SOB  Inpatient Medications    Scheduled Meds: . aspirin EC  81 mg Oral Daily  . atorvastatin  80 mg Oral q1800  . clopidogrel  75 mg Oral Daily  . gabapentin  300 mg Oral TID  . insulin aspart  0-5 Units Subcutaneous QHS  . insulin aspart  0-9 Units Subcutaneous TID WC  . insulin glargine  35 Units Subcutaneous Daily  . metoprolol succinate  75 mg Oral Daily  . pantoprazole  40 mg Oral Daily  . sodium chloride flush  3 mL Intravenous Q12H   Continuous Infusions: . sodium chloride    . heparin 1,100 Units/hr (05/10/18 0446)  . nitroGLYCERIN Stopped (05/09/18 0110)   PRN Meds: sodium chloride, acetaminophen **OR** acetaminophen, hydrALAZINE, ondansetron **OR** ondansetron (ZOFRAN) IV, oxyCODONE, sodium chloride flush   Vital Signs    Vitals:   05/09/18 1541 05/09/18 1944 05/10/18 0000 05/10/18 0400  BP: (!) 136/59 (!) 156/67 (!) 125/49 128/61  Pulse: 67 72    Resp: 17 14 18   Temp: 98 F (36.7 C) 98.5 F (36.9 C) 98.6 F (37 C) 98 F (36.7 C)  TempSrc: Oral Oral Oral Oral  SpO2: 97% 99% 97% 97%  Weight:    82.6 kg  Height:        Intake/Output Summary (Last 24 hours) at 05/10/2018 0844 Last data filed at 05/10/2018 0000 Gross per 24 hour  Intake 173.93 ml  Output 500 ml  Net -326.07 ml   Last 3 Weights 05/10/2018 05/09/2018 05/08/2018  Weight (lbs) 182 lb 1.6 oz 181 lb 180 lb  Weight (kg) 82.6 kg 82.1 kg 81.647 kg      Telemetry    Sinus rhythm- Personally Reviewed  ECG     none new- Personally Reviewed  Physical Exam   GEN: Well nourished, well developed, in no acute distress  HEENT: normal  Neck: no JVD, carotid bruits, or masses Cardiac: RRR; no murmurs, rubs, or gallops,no edema  Respiratory:  clear to auscultation bilaterally, normal work of breathing GI: soft, nontender, nondistended, +  BS MS: no deformity or atrophy  Skin: warm and dry Neuro:  Strength and sensation are intact Psych: euthymic mood, full affect   Labs    Chemistry Recent Labs  Lab 05/08/18 0720 05/08/18 1121 05/09/18 0450 05/10/18 0231  NA 133*  --  137 139  K 5.3*  --  4.3 4.3  CL 100  --  103 106  CO2 18*  --  20* 23  GLUCOSE 385*  --  127* 84  BUN 29*  --  37* 29*  CREATININE 1.48* 1.30* 2.07* 1.39*  CALCIUM 9.3  --  8.4* 8.5*  GFRNONAA 36* 42* 24* 39*  GFRAA 42* 49* 28* 45*  ANIONGAP 15  --  14 10     Hematology Recent Labs  Lab 05/08/18 1121 05/09/18 0450 05/10/18 0231  WBC 12.2* 9.3 8.5  RBC 4.28 3.53* 3.16*  HGB 12.0 9.8* 9.2*  HCT 35.3* 30.0* 26.8*  MCV 82.5 85.0 84.8  MCH 28.0 27.8 29.1  MCHC 34.0 32.7 34.3  RDW 13.8 13.8 13.9  PLT 261 232 192    Cardiac Enzymes Recent Labs  Lab 05/08/18 1726 05/08/18 2232 05/09/18 0450 05/09/18 1134  TROPONINI 9.92* 7.63* 7.09* 3.72*   No results for input(s): TROPIPOC in the last 168   hours.   BNP Recent Labs  Lab 05/08/18 0720  BNP 624.8*     DDimer No results for input(s): DDIMER in the last 168 hours.   Radiology    No results found.  Cardiac Studies   TTE 02/13/18 - Left ventricle: The cavity size was normal. Wall thickness was   normal. The estimated ejection fraction was 45%. Diffuse   hypokinesis. Features are consistent with a pseudonormal left   ventricular filling pattern, with concomitant abnormal relaxation   and increased filling pressure (grade 2 diastolic dysfunction). - Aortic valve: There was no stenosis. - Mitral valve: Mildly calcified annulus. There was no significant   regurgitation. - Left atrium: The atrium was mildly dilated. - Right ventricle: The cavity size was normal. Systolic function   was normal. - Pulmonary arteries: No complete TR doppler jet so unable to   estimate PA systolic pressure. - Systemic veins: IVC measured 2.5 cm with > 50% respirophasic   variation, suggesting  RA pressure 8 mmHg. - Pericardium, extracardiac: A trivial pericardial effusion was   identified.  Patient Profile     68 y.o. female with a history of coronary artery disease and PVD as well as chronic combined systolic and diastolic heart failure, type 2 diabetes, hypertension, hyperlipidemia, CKD, and prior stroke who presented to the hospital with shortness of breath found to have non-STEMI and acute on chronic systolic and diastolic heart failure.  Assessment & Plan    1.  Non-STEMI/coronary artery disease: Troponin peak to 9.92. continue Heparin and NTG. Will plan for [facility] tomorrow.  The patient understands that risks include but are not limited to stroke (1 in 1000), death (1 in 1000), kidney failure [usually temporary] (1 in 500), bleeding (1 in 200), allergic reaction [possibly serious] (1 in 200), and agrees to proceed.   2.  Acute on chronic combined systolic and diastolic heart failure: Currently I=O and not SOB. Will continue holding lasix as creatinine improving.  3.  Hypertension: better controlled today.  4.  Hyperlipidemia: LDL above goal of 70, continue statin.  5.  Acute renal failure with CKD stage III: creatinine improving, continue to hold lasix.     For questions or updates, please contact [facility] Please consult [email or web address] for contact info under        Signed, Will Jorja Loa, MD  05/10/2018, 8:44 AM

## 2018-05-10 NOTE — Progress Notes (Signed)
ANTICOAGULATION CONSULT NOTE  Pharmacy Consult for heparin Indication: chest pain/ACS  Allergies  Allergen Reactions  . Doxycycline Other (See Comments)    lethargy  . Hydrochlorothiazide Other (See Comments)    lethargic   . Latex Rash  . Penicillins Swelling and Rash    Pt states she has tolerated Keflex in the past without problems. States she may have tolerated Augmentin in the past but it caused GI upset. Has patient had a PCN reaction causing immediate rash, facial/tongue/throat swelling, SOB or lightheadedness with hypotension: Yes Has patient had a PCN reaction causing severe rash involving mucus membranes or skin necrosis: No Has patient had a PCN reaction that required hospitalization No Has patient had a PCN reaction occurring within the last 10 years: No    Patient Measurements: Height: 5\' 4"  (162.6 cm) Weight: 182 lb 1.6 oz (82.6 kg) IBW/kg (Calculated) : 54.7 Heparin Dosing Weight: 72kg  Vital Signs: Temp: 98 F (36.7 C) (04/12 0400) Temp Source: Oral (04/12 0400) BP: 128/61 (04/12 0400)  Labs: Recent Labs    05/08/18 1121  05/08/18 2232 05/09/18 0450 05/09/18 1134 05/10/18 0231  HGB 12.0  --   --  9.8*  --  9.2*  HCT 35.3*  --   --  30.0*  --  26.8*  PLT 261  --   --  232  --  192  HEPARINUNFRC  --   --   --  0.33 0.31 0.18*  CREATININE 1.30*  --   --  2.07*  --  1.39*  TROPONINI 1.31*   < > 7.63* 7.09* 3.72*  --    < > = values in this interval not displayed.    Estimated Creatinine Clearance: 40.3 mL/min (A) (by C-G formula based on SCr of 1.39 mg/dL (H)).  Assessment: 68 y.o. female with elevated cardiac markers for heparin.  Heparin level at goal this afternoon, planning cath tomorrow.  Goal of Therapy:  Heparin level 0.3-0.7 units/ml Monitor platelets by anticoagulation protocol: Yes   Plan:  Continue heparin 1100 units/hr Check heparin level in am  Sheppard Coil PharmD., BCPS Clinical Pharmacist 05/10/2018 8:48 AM

## 2018-05-10 NOTE — Progress Notes (Signed)
Progress Note  Patient Name: [name] [name] Date of Encounter: 05/10/2018  Primary Cardiologist: Nicki Guadalajara, MD   Subjective   Feels well, no chest pain, SOB  Inpatient Medications    Scheduled Meds: . aspirin EC  81 mg Oral Daily  . atorvastatin  80 mg Oral q1800  . clopidogrel  75 mg Oral Daily  . gabapentin  300 mg Oral TID  . insulin aspart  0-5 Units Subcutaneous QHS  . insulin aspart  0-9 Units Subcutaneous TID WC  . insulin glargine  35 Units Subcutaneous Daily  . metoprolol succinate  75 mg Oral Daily  . pantoprazole  40 mg Oral Daily  . sodium chloride flush  3 mL Intravenous Q12H   Continuous Infusions: . sodium chloride    . heparin 1,100 Units/hr (05/10/18 0446)  . nitroGLYCERIN Stopped (05/09/18 0110)   PRN Meds: sodium chloride, acetaminophen **OR** acetaminophen, hydrALAZINE, ondansetron **OR** ondansetron (ZOFRAN) IV, oxyCODONE, sodium chloride flush   Vital Signs    Vitals:   05/09/18 1541 05/09/18 1944 05/10/18 0000 05/10/18 0400  BP: (!) 136/59 (!) 156/67 (!) 125/49 128/61  Pulse: 67 72    Resp: 17 14 18    Temp: 98 F (36.7 C) 98.5 F (36.9 C) 98.6 F (37 C) 98 F (36.7 C)  TempSrc: Oral Oral Oral Oral  SpO2: 97% 99% 97% 97%  Weight:    82.6 kg  Height:        Intake/Output Summary (Last 24 hours) at 05/10/2018 0844 Last data filed at 05/10/2018 0000 Gross per 24 hour  Intake 173.93 ml  Output 500 ml  Net -326.07 ml   Last 3 Weights 05/10/2018 05/09/2018 05/08/2018  Weight (lbs) 182 lb 1.6 oz 181 lb 180 lb  Weight (kg) 82.6 kg 82.1 kg 81.647 kg      Telemetry    Sinus rhythm- Personally Reviewed  ECG     none new- Personally Reviewed  Physical Exam   GEN: Well nourished, well developed, in no acute distress  HEENT: normal  Neck: no JVD, carotid bruits, or masses Cardiac: RRR; no murmurs, rubs, or gallops,no edema  Respiratory:  clear to auscultation bilaterally, normal work of breathing GI: soft, nontender, nondistended, +  BS MS: no deformity or atrophy  Skin: warm and dry Neuro:  Strength and sensation are intact Psych: euthymic mood, full affect   Labs    Chemistry Recent Labs  Lab 05/08/18 0720 05/08/18 1121 05/09/18 0450 05/10/18 0231  NA 133*  --  137 139  K 5.3*  --  4.3 4.3  CL 100  --  103 106  CO2 18*  --  20* 23  GLUCOSE 385*  --  127* 84  BUN 29*  --  37* 29*  CREATININE 1.48* 1.30* 2.07* 1.39*  CALCIUM 9.3  --  8.4* 8.5*  GFRNONAA 36* 42* 24* 39*  GFRAA 42* 49* 28* 45*  ANIONGAP 15  --  14 10     Hematology Recent Labs  Lab 05/08/18 1121 05/09/18 0450 05/10/18 0231  WBC 12.2* 9.3 8.5  RBC 4.28 3.53* 3.16*  HGB 12.0 9.8* 9.2*  HCT 35.3* 30.0* 26.8*  MCV 82.5 85.0 84.8  MCH 28.0 27.8 29.1  MCHC 34.0 32.7 34.3  RDW 13.8 13.8 13.9  PLT 261 232 192    Cardiac Enzymes Recent Labs  Lab 05/08/18 1726 05/08/18 2232 05/09/18 0450 05/09/18 1134  TROPONINI 9.92* 7.63* 7.09* 3.72*   No results for input(s): TROPIPOC in the last 168  hours.   BNP Recent Labs  Lab 05/08/18 0720  BNP 624.8*     DDimer No results for input(s): DDIMER in the last 168 hours.   Radiology    No results found.  Cardiac Studies   TTE 02/13/18 - Left ventricle: The cavity size was normal. Wall thickness was   normal. The estimated ejection fraction was 45%. Diffuse   hypokinesis. Features are consistent with a pseudonormal left   ventricular filling pattern, with concomitant abnormal relaxation   and increased filling pressure (grade 2 diastolic dysfunction). - Aortic valve: There was no stenosis. - Mitral valve: Mildly calcified annulus. There was no significant   regurgitation. - Left atrium: The atrium was mildly dilated. - Right ventricle: The cavity size was normal. Systolic function   was normal. - Pulmonary arteries: No complete TR doppler jet so unable to   estimate PA systolic pressure. - Systemic veins: IVC measured 2.5 cm with > 50% respirophasic   variation, suggesting  RA pressure 8 mmHg. - Pericardium, extracardiac: A trivial pericardial effusion was   identified.  Patient Profile     68 y.o. female with a history of coronary artery disease and PVD as well as chronic combined systolic and diastolic heart failure, type 2 diabetes, hypertension, hyperlipidemia, CKD, and prior stroke who presented to the hospital with shortness of breath found to have non-STEMI and acute on chronic systolic and diastolic heart failure.  Assessment & Plan    1.  Non-STEMI/coronary artery disease: Troponin peak to 9.92. continue Heparin and NTG. Branna Cortina plan for [facility] tomorrow.  The patient understands that risks include but are not limited to stroke (1 in 1000), death (1 in 1000), kidney failure [usually temporary] (1 in 500), bleeding (1 in 200), allergic reaction [possibly serious] (1 in 200), and agrees to proceed.   2.  Acute on chronic combined systolic and diastolic heart failure: Currently I=O and not SOB. Keziyah Kneale continue holding lasix as creatinine improving.  3.  Hypertension: better controlled today.  4.  Hyperlipidemia: LDL above goal of 70, continue statin.  5.  Acute renal failure with CKD stage III: creatinine improving, continue to hold lasix.     For questions or updates, please contact [facility] Please consult [email or web address] for contact info under        Signed, Dagoberto Nealy Jorja Loa, MD  05/10/2018, 8:44 AM

## 2018-05-11 ENCOUNTER — Encounter (HOSPITAL_COMMUNITY)
Admission: EM | Disposition: A | Discharge status: Home / Self Care | Payer: Self-pay | Source: Home / Self Care | Attending: Internal Medicine

## 2018-05-11 ENCOUNTER — Other Ambulatory Visit (HOSPITAL_COMMUNITY): Payer: Medicare Other

## 2018-05-11 DIAGNOSIS — I214 Non-ST elevation (NSTEMI) myocardial infarction: Secondary | ICD-10-CM

## 2018-05-11 DIAGNOSIS — I10 Essential (primary) hypertension: Secondary | ICD-10-CM

## 2018-05-11 DIAGNOSIS — I2510 Atherosclerotic heart disease of native coronary artery without angina pectoris: Secondary | ICD-10-CM

## 2018-05-11 DIAGNOSIS — I25110 Atherosclerotic heart disease of native coronary artery with unstable angina pectoris: Secondary | ICD-10-CM

## 2018-05-11 DIAGNOSIS — R4701 Aphasia: Secondary | ICD-10-CM

## 2018-05-11 HISTORY — PX: LEFT HEART CATH AND CORONARY ANGIOGRAPHY: CATH118249

## 2018-05-11 LAB — CBC
HCT: 27.4 % — ABNORMAL LOW (ref 36.0–46.0)
Hemoglobin: 9.5 g/dL — ABNORMAL LOW (ref 12.0–15.0)
MCH: 29.3 pg (ref 26.0–34.0)
MCHC: 34.7 g/dL (ref 30.0–36.0)
MCV: 84.6 fL (ref 80.0–100.0)
Platelets: 193 10*3/uL (ref 150–400)
RBC: 3.24 MIL/uL — ABNORMAL LOW (ref 3.87–5.11)
RDW: 13.7 % (ref 11.5–15.5)
WBC: 9.6 10*3/uL (ref 4.0–10.5)
nRBC: 0 % (ref 0.0–0.2)

## 2018-05-11 LAB — GLUCOSE, CAPILLARY
Glucose-Capillary: 149 mg/dL — ABNORMAL HIGH (ref 70–99)
Glucose-Capillary: 84 mg/dL (ref 70–99)
Glucose-Capillary: 280 mg/dL — ABNORMAL HIGH (ref 70–99)
Glucose-Capillary: 248 mg/dL — ABNORMAL HIGH (ref 70–99)

## 2018-05-11 LAB — BASIC METABOLIC PANEL
Anion gap: 9 (ref 5–15)
BUN: 22 mg/dL (ref 8–23)
CO2: 21 mmol/L — ABNORMAL LOW (ref 22–32)
Calcium: 8.4 mg/dL — ABNORMAL LOW (ref 8.9–10.3)
Chloride: 108 mmol/L (ref 98–111)
Creatinine, Ser: 1 mg/dL (ref 0.44–1.00)
GFR calc Af Amer: >60 mL/min (ref >60)
GFR calc non Af Amer: 58 mL/min — ABNORMAL LOW (ref >60)
Glucose, Bld: 224 mg/dL — ABNORMAL HIGH (ref 70–99)
Potassium: 4.2 mmol/L (ref 3.5–5.1)
Sodium: 138 mmol/L (ref 135–145)

## 2018-05-11 LAB — POCT ACTIVATED CLOTTING TIME: Activated Clotting Time: 186 seconds

## 2018-05-11 LAB — HEPARIN LEVEL (UNFRACTIONATED): Heparin Unfractionated: 0.32 IU/mL (ref 0.30–0.70)

## 2018-05-11 SURGERY — LEFT HEART CATH AND CORONARY ANGIOGRAPHY
Anesthesia: LOCAL

## 2018-05-11 MED ORDER — HYDRALAZINE HCL 20 MG/ML IJ SOLN: 10.0000 mg | INTRAMUSCULAR | Status: AC | PRN

## 2018-05-11 MED ORDER — LABETALOL HCL 5 MG/ML IV SOLN: 10.0000 mg | INTRAVENOUS | Status: AC | PRN

## 2018-05-11 MED ORDER — HEPARIN (PORCINE) 25000 UT/250ML-% IV SOLN
1250.0000 [IU]/h | INTRAVENOUS | Status: DC
  Administered 2018-05-11: 18:00:00 1100 [IU]/h via INTRAVENOUS
  Filled 2018-05-11: qty 250

## 2018-05-11 MED ORDER — FENTANYL CITRATE (PF) 100 MCG/2ML IJ SOLN
INTRAMUSCULAR | Status: AC
  Filled 2018-05-11: qty 2

## 2018-05-11 MED ORDER — ISOSORBIDE MONONITRATE ER 60 MG PO TB24
60.0000 mg | ORAL_TABLET | Freq: Every day | ORAL | Status: DC
  Administered 2018-05-11 – 2018-05-13 (×3): 60 mg via ORAL
  Filled 2018-05-11 (×3): qty 1

## 2018-05-11 MED ORDER — VERAPAMIL HCL 2.5 MG/ML IV SOLN
INTRAVENOUS | Status: DC | PRN
  Administered 2018-05-11: 08:00:00 10 mL via INTRA_ARTERIAL

## 2018-05-11 MED ORDER — MIDAZOLAM HCL 2 MG/2ML IJ SOLN
INTRAMUSCULAR | Status: DC | PRN
  Administered 2018-05-11 (×3): 1 mg via INTRAVENOUS

## 2018-05-11 MED ORDER — SODIUM CHLORIDE 0.9 % IV SOLN
INTRAVENOUS | Status: DC
  Administered 2018-05-11: 19:00:00 via INTRAVENOUS

## 2018-05-11 MED ORDER — MIDAZOLAM HCL 2 MG/2ML IJ SOLN
INTRAMUSCULAR | Status: AC
  Filled 2018-05-11: qty 2

## 2018-05-11 MED ORDER — INSULIN GLARGINE 100 UNIT/ML ~~LOC~~ SOLN
35.0000 [IU] | Freq: Every day | SUBCUTANEOUS | Status: DC
  Administered 2018-05-11 – 2018-05-12 (×2): 35 [IU] via SUBCUTANEOUS
  Filled 2018-05-11 (×3): qty 0.35

## 2018-05-11 MED ORDER — ONDANSETRON HCL 4 MG/2ML IJ SOLN: 4.0000 mg | Freq: Four times a day (QID) | INTRAMUSCULAR | Status: DC | PRN

## 2018-05-11 MED ORDER — IOHEXOL 350 MG/ML SOLN
INTRAVENOUS | Status: DC | PRN
  Administered 2018-05-11: 130 mL via INTRACARDIAC

## 2018-05-11 MED ORDER — SODIUM CHLORIDE 0.9% FLUSH
3.0000 mL | Freq: Two times a day (BID) | INTRAVENOUS | Status: DC
  Administered 2018-05-11 – 2018-05-13 (×3): 3 mL via INTRAVENOUS

## 2018-05-11 MED ORDER — DIAZEPAM 5 MG PO TABS: 5.0000 mg | ORAL_TABLET | Freq: Four times a day (QID) | ORAL | Status: DC | PRN

## 2018-05-11 MED ORDER — HEPARIN (PORCINE) IN NACL 1000-0.9 UT/500ML-% IV SOLN
INTRAVENOUS | Status: AC
  Filled 2018-05-11: qty 500

## 2018-05-11 MED ORDER — SODIUM CHLORIDE 0.9 % IV SOLN: 250.0000 mL | INTRAVENOUS | Status: DC | PRN

## 2018-05-11 MED ORDER — HEPARIN (PORCINE) IN NACL 1000-0.9 UT/500ML-% IV SOLN
INTRAVENOUS | Status: AC
  Filled 2018-05-11: qty 1000

## 2018-05-11 MED ORDER — LIDOCAINE HCL (PF) 1 % IJ SOLN
INTRAMUSCULAR | Status: DC | PRN
  Administered 2018-05-11: 15 mL via INTRADERMAL
  Administered 2018-05-11: 2 mL via INTRADERMAL

## 2018-05-11 MED ORDER — FENTANYL CITRATE (PF) 100 MCG/2ML IJ SOLN
INTRAMUSCULAR | Status: DC | PRN
  Administered 2018-05-11 (×3): 25 ug via INTRAVENOUS

## 2018-05-11 MED ORDER — ACETAMINOPHEN 325 MG PO TABS
650.0000 mg | ORAL_TABLET | ORAL | Status: DC | PRN
  Administered 2018-05-11 – 2018-05-12 (×2): 650 mg via ORAL
  Filled 2018-05-11 (×2): qty 2

## 2018-05-11 MED ORDER — VERAPAMIL HCL 2.5 MG/ML IV SOLN
INTRAVENOUS | Status: AC
  Filled 2018-05-11: qty 2

## 2018-05-11 MED ORDER — ATORVASTATIN CALCIUM 80 MG PO TABS: 80.0000 mg | ORAL_TABLET | Freq: Every day | ORAL | Status: DC

## 2018-05-11 MED ORDER — ASPIRIN 81 MG PO CHEW: 81.0000 mg | CHEWABLE_TABLET | Freq: Every day | ORAL | Status: DC

## 2018-05-11 MED ORDER — SODIUM CHLORIDE 0.9% FLUSH: 3.0000 mL | INTRAVENOUS | Status: DC | PRN

## 2018-05-11 MED ORDER — HEPARIN (PORCINE) IN NACL 1000-0.9 UT/500ML-% IV SOLN
INTRAVENOUS | Status: DC | PRN
  Administered 2018-05-11 (×3): 500 mL

## 2018-05-11 MED ORDER — HEPARIN SODIUM (PORCINE) 1000 UNIT/ML IJ SOLN
INTRAMUSCULAR | Status: DC | PRN
  Administered 2018-05-11: 4000 [IU] via INTRAVENOUS

## 2018-05-11 MED ORDER — HEPARIN SODIUM (PORCINE) 1000 UNIT/ML IJ SOLN
INTRAMUSCULAR | Status: AC
  Filled 2018-05-11: qty 1

## 2018-05-11 MED ORDER — LIDOCAINE HCL (PF) 1 % IJ SOLN
INTRAMUSCULAR | Status: AC
  Filled 2018-05-11: qty 30

## 2018-05-11 SURGICAL SUPPLY — 20 items
CATH INFINITI 5 FR JL3.5 (CATHETERS) ×1 IMPLANT
CATH INFINITI JR4 5F (CATHETERS) ×1 IMPLANT
CATH LAUNCHER 5F EBU3.5 (CATHETERS) ×1 IMPLANT
CATH LAUNCHER 5F RADL (CATHETERS) IMPLANT
CATH LAUNCHER 6FR AL1 (CATHETERS) IMPLANT
CATH OPTITORQUE TIG 4.0 5F (CATHETERS) ×1 IMPLANT
CATH VISTA GUIDE 6FR XBLAD3.5 (CATHETERS) ×1 IMPLANT
CATHETER LAUNCHER 5F RADL (CATHETERS) ×2
CATHETER LAUNCHER 6FR AL1 (CATHETERS) ×2
DEVICE RAD COMP TR BAND LRG (VASCULAR PRODUCTS) ×1 IMPLANT
GLIDESHEATH SLEND SS 6F .021 (SHEATH) ×1 IMPLANT
GUIDEWIRE INQWIRE 1.5J.035X260 (WIRE) IMPLANT
INQWIRE 1.5J .035X260CM (WIRE) ×2
KIT HEART LEFT (KITS) ×2 IMPLANT
PACK CARDIAC CATHETERIZATION (CUSTOM PROCEDURE TRAY) ×2 IMPLANT
SHEATH PINNACLE 5F 10CM (SHEATH) ×1 IMPLANT
TRANSDUCER W/STOPCOCK (MISCELLANEOUS) ×2 IMPLANT
TUBING CIL FLEX 10 FLL-RA (TUBING) ×2 IMPLANT
WIRE EMERALD 3MM-J .035X150CM (WIRE) ×1 IMPLANT
WIRE HI TORQ VERSACORE-J 145CM (WIRE) ×1 IMPLANT

## 2018-05-11 NOTE — Interval H&P Note (Signed)
Cath Lab Visit (complete for each Cath Lab visit)  Clinical Evaluation Leading to the Procedure:   ACS: Yes.    Non-ACS:    Anginal Classification: CCS IV  Anti-ischemic medical therapy: Maximal Therapy (2 or more classes of medications)  Non-Invasive Test Results: No non-invasive testing performed  Prior CABG: No previous CABG      History and Physical Interval Note:  05/11/2018 7:44 AM  [name] [name]  has presented today for surgery, with the diagnosis of NSTEMI.  The various methods of treatment have been discussed with the patient and family. After consideration of risks, benefits and other options for treatment, the patient has consented to  Procedure(s): LEFT HEART CATH AND CORONARY ANGIOGRAPHY (N/A) as a surgical intervention.  The patient's history has been reviewed, patient examined, no change in status, stable for surgery.  I have reviewed the patient's chart and labs.  Questions were answered to the patient's satisfaction.     Nicki Guadalajara

## 2018-05-11 NOTE — Progress Notes (Signed)
PROGRESS NOTE    [name] [name]  ZOX:[id number]  DOB: 07-19-1950  DOA: 05/08/2018 PCP: Georgann Housekeeper, MD  Brief Narrative: 68 year old female with history of hypertension, hyperlipidemia, CAD, combined diastolic and systolic CHF with EF 45% in 1/20, chronic kidney disease stage III with baseline creatinine around 1.4, type 2 diabetes mellitus presented with complaints of dyspnea/leg edema after she missed couple of doses of Lasix at home.  She denies any chest pain but apparently had 2 episodes of vomiting prior to admission.  Work-up in the ED revealed elevated BNP at 624 and chest x-ray showing pulmonary edema.  She was hypertensive on presentation with systolic blood pressure in 170s, hypoxia requiring 2 L nasal cannula.  She was given 2 doses of IV Lasix and initiated on heparin/nitro drip after her troponins were noted to be elevated (troponin overnight peaked to 9).  Cardiology evaluated patient and planned for cardiac cath on 4/13.  Subjective: Patient underwent cardiac cath and tolerated procedure well.  She just returned to her room when seen by me and was talking to family members on the phone and explaining cath results.  Denied any chest pain.  Blood pressure on the monitor showed systolic 80s but repeat check revealed systolic 110.  She denies any dizziness.  Objective: Vitals:   05/11/18 1100 05/11/18 1115 05/11/18 1130 05/11/18 1300  BP: (!) 84/69 114/70 (!) 123/102 (!) 152/66  Pulse: 99 (!) 147 72 75  Resp: (!) Temp:   (!) 97.5 F (36.4 C)   TempSrc:   Axillary   SpO2: 91% 91% 96% 100%  Weight:      Height:        Intake/Output Summary (Last 24 hours) at 05/11/2018 1446 Last data filed at 05/11/2018 1241 Gross per 24 hour  Intake 1149.5 ml  Output -  Net 1149.5 ml   Filed Weights   05/09/18 0554 05/10/18 0400 05/11/18 0600  Weight: 82.1 kg 82.6 kg 82.3 kg    Physical Examination:  General exam: Appears calm and comfortable  Respiratory system:  Clear to auscultation. Respiratory effort normal. Cardiovascular system: S1 & S2 heard, RRR. No JVD, murmurs, rubs, gallops or clicks. No leg edema noted this morning. Gastrointestinal system: Abdomen is nondistended, soft and nontender. No organomegaly or masses felt. Normal bowel sounds heard. Central nervous system: Alert and oriented. No focal neurological deficits. Extremities: Symmetric 5 x 5 power. Skin: Dry dressing at cath site with no significant drainage.  No rashes, lesions or ulcers Psychiatry: Judgement and insight appear normal. Mood & affect appropriate.     Data Reviewed: I have personally reviewed following labs and imaging studies  CBC: Recent Labs  Lab 05/08/18 1121 05/09/18 0450 05/10/18 0231 05/11/18 0247  WBC 12.2* 9.3 8.5 9.6  NEUTROABS 10.6*  --   --   --   HGB 12.0 9.8* 9.2* 9.5*  HCT 35.3* 30.0* 26.8* 27.4*  MCV 82.5 85.0 84.8 84.6  PLT 261 232 192 193   Basic Metabolic Panel: Recent Labs  Lab 05/08/18 0720 05/08/18 1121 05/09/18 0450 05/10/18 0231 05/11/18 0247  NA 133*  --  137 139 138  K 5.3*  --  4.3 4.3 4.2  CL 100  --  103 106 108  CO2 18*  --  20* 23 21*  GLUCOSE 385*  --  127* 84 224*  BUN 29*  --  37* 29* 22  CREATININE 1.48* 1.30* 2.07* 1.39* 1.00  CALCIUM 9.3  --  8.4* 8.5*  8.4*   GFR: Estimated Creatinine Clearance: 55.8 mL/min (by C-G formula based on SCr of 1 mg/dL). Liver Function Tests: No results for input(s): AST, ALT, ALKPHOS, BILITOT, PROT, ALBUMIN in the last 168 hours. No results for input(s): LIPASE, AMYLASE in the last 168 hours. No results for input(s): AMMONIA in the last 168 hours. Coagulation Profile: No results for input(s): INR, PROTIME in the last 168 hours. Cardiac Enzymes: Recent Labs  Lab 05/08/18 1121 05/08/18 1726 05/08/18 2232 05/09/18 0450 05/09/18 1134  TROPONINI 1.31* 9.92* 7.63* 7.09* 3.72*   BNP (last 3 results) No results for input(s): PROBNP in the last 8760 hours. HbA1C: No results  for input(s): HGBA1C in the last 72 hours. CBG: Recent Labs  Lab 05/10/18 1059 05/10/18 1643 05/10/18 2056 05/11/18 0554 05/11/18 0935  GLUCAP 172* 259* 287* 149* 84   Lipid Profile: Recent Labs    05/10/18 0231  CHOL 211*  HDL 30*  LDLCALC 104*  TRIG 386*  CHOLHDL 7.0   Thyroid Function Tests: No results for input(s): TSH, T4TOTAL, FREET4, T3FREE, THYROIDAB in the last 72 hours. Anemia Panel: No results for input(s): VITAMINB12, FOLATE, FERRITIN, TIBC, IRON, RETICCTPCT in the last 72 hours. Sepsis Labs: No results for input(s): PROCALCITON, LATICACIDVEN in the last 168 hours.  Recent Results (from the past 240 hour(s))  MRSA PCR Screening     Status: None   Collection Time: 05/08/18 11:52 AM  Result Value Ref Range Status   MRSA by PCR NEGATIVE NEGATIVE Final    Comment:        The GeneXpert MRSA Assay (FDA approved for NASAL specimens only), is one component of a comprehensive MRSA colonization surveillance program. It is not intended to diagnose MRSA infection nor to guide or monitor treatment for MRSA infections. Performed at [facility], [address] [place], [place] [address]       Radiology Studies: No results found.      Scheduled Meds: . aspirin EC  81 mg Oral Daily  . atorvastatin  80 mg Oral q1800  . gabapentin  100 mg Oral TID  . insulin aspart  0-5 Units Subcutaneous QHS  . insulin aspart  0-9 Units Subcutaneous TID WC  . insulin glargine  35 Units Subcutaneous QHS  . isosorbide mononitrate  60 mg Oral Daily  . metoprolol succinate  75 mg Oral Daily  . pantoprazole  40 mg Oral Daily  . sodium chloride flush  3 mL Intravenous Q12H   Continuous Infusions: . sodium chloride 100 mL/hr at 05/11/18 1001  . sodium chloride    . heparin      Assessment & Plan:    1.   Non-STEMI: Patient denies any chest pain but troponin peaked to 9.  Seen by cardiology and underwent cardiac cath today showing severe left main disease as well  as severe stenosis of dominant left circumflex.  Cardiology recommends evaluation for bypass surgery.  Creatinine fortunately improved to 1.0 before cath. Continue aspirin/Plavix/statin/beta blockers.Nitro drip discontinued by cardiology and patient now on Imdur.  Defer need for heparin drip to cardiology.  2.Acute hypoxic respiratory failure POA secondary to acute on chronic combined systolic/diastolic CHF: Patient diuresed on admission but been off diuretics since April 11 in view for hypotension/AKI.  O2  titrated to off as tolerated.  Monitor I's and O's and daily weights.  Repeat echo shows similar EF as before at 45% and moderate pulmonary hypertension. Held losartan in view of AKI.  Continue  beta-blockers. Nitro drip  now transitioned to Imdur.    3.  AKI on chronic kidney disease stage II: Creatinine peaked up to 2 and back to baseline 1.3 yesterday but even better today at creatinine 1.0. Could be prerenal in the setting of aggressive diuresis versus ATN in the setting of significant and rapid blood pressure drop/fluctuations.  Holding diuretics and ACE inhibitors.  Patient did receive fluid bolus 4/10 in the setting of hypotension. Will avoid further hydration (unless hypotensive) in concern for #1 but will need to monitor creatinine post cath.    4.  Hypertension/hypotension: ?secondary to vasodilators and aggressive diuresis.  Off IV nitro drip.  Back on Imdur now.  Holding losartan and diuretics as discussed above.  Can resume losartan at lower dose if post-cath creatinine remains stable and if blood pressure elevated.  She may have orthostasis given report of fluctuating SBPs at baseline. She is noted to be on high doses of neurontin at baseline (300mg  TID)  5.  Hyperlipidemia: Resume statins  6.  Diabetes mellitus: On Lantus insulin and sliding scale. Changed Lantus to QHS as patient was NPO this morning  7.GERD: PPI   DVT prophylaxis: Heparin drip Code Status: Full code Family /  Patient Communication: Discussed with patient Disposition Plan: Home when medically cleared     LOS: 3 days    Time spent: 35 minutes    Alessandra Bevels, MD [facility] Pager [phone]  If 7PM-7AM, please contact night-coverage [email or web address] Password [facility] 05/11/2018, 2:46 PM

## 2018-05-11 NOTE — Plan of Care (Signed)
  Problem: Education: Goal: Knowledge of General Education information will improve Description Including pain rating scale, medication(s)/side effects and non-pharmacologic comfort measures Outcome: Progressing   

## 2018-05-11 NOTE — Progress Notes (Signed)
Inpatient Diabetes Program Recommendations  AACE/ADA: New Consensus Statement on Inpatient Glycemic Control (2015)  Target Ranges:  Prepandial:   less than 140 mg/dL      Peak postprandial:   less than 180 mg/dL (1-2 hours)      Critically ill patients:  140 - 180 mg/dL   Lab Results  Component Value Date   GLUCAP 149 (H) 05/11/2018   HGBA1C 8.6 (H) 02/18/2018    Review of Glycemic Control Results for ALYSANDRA, LOSI (MRN [id number]) as of 05/11/2018 11:33  Ref. Range 05/10/2018 16:43 05/10/2018 20:56 05/11/2018 05:54  Glucose-Capillary Latest Ref Range: 70 - 99 mg/dL 101 (H) 751 (H) 025 (H)   Diabetes history: DM 2 Outpatient Diabetes medications: Levemir 35 units q HS, Metformin 1000 mg bid Current orders for Inpatient glycemic control:  Lantus 35 units q HS, Novolog sensitive tid with meals and HS  Inpatient Diabetes Program Recommendations:   May consider adding Novolog 3 units tid with meals (meal coverage).   Thanks,  Beryl Meager, RN, BC-ADM Inpatient Diabetes Coordinator Pager [phone] (8a-5p)

## 2018-05-11 NOTE — Consult Note (Signed)
[address]       [place] [address]             [phone]        [name] Medical Record #[id number] Date of Birth: 1950-03-27  Referring: Dr. Tresa Endo, MD Primary Care: Georgann Housekeeper, MD Primary Cardiologist:Thomas Tresa Endo, MD  Chief Complaint:    Chief Complaint  Patient presents with  . Right arm pain, shortness of Breath  Reason for consultation: S/p NSTEMI, coronary artery disease  History of Present Illness:     This is a 68 year old female with a past medical history of coronary artery disease (s/p MI 55', PCI with stent to Circumflex) ,diabetes mellitus (type II), hypertension, hyperlipidemia, CKD (stage III), PVD, stroke, CHF, GERD, remote tobacco abuse, carotid stenosis (total occlusion of right internal carotid artery and small amount of fibrous plaque of the left internal carotid artery), and chronic combined systolic and diastolic failure who presented to [facility] ED on 05/08/2018 with complaints of right arm pain, vomited once, and shortness of breath. This occurred suddenly around 1:30 am on 04/10 and awakened her from sleep. In addition, she had complaints of swelling in her legs, and unable to lie flat.  In the ED, she was tachycardic and hypertensive (SBP in the 170's) as well as hypoxic. EKG showed NSR new lateral TW changes compared to prior EKGs. Initial Troponin I was 0.09 but went as high as 9.92. She ruled in for a NSTEMI and was put on a Heparin drip. CXR showed cardiomegaly with interstitial edema. BNP on admission was 624. She was hyperkalemic with a potassium of 5.3 as well. She was given IV Lasix and supplemental oxygen.   Echo done on 04/11 showed LVEF 45%, mild thickening of the mitral valve leaflet and moderate mitral annular calcification, mild MR and TR. She underwent a cardiac catheterization today which showed LVEF 40-45%, significant coronary calcification with eccentric 80% mid distal left main stenosis,  and a  large dominant left calcified left circumflex coronary artery with previously placed stent in the proximal circumflex with focal mid instent 85% stenosis, as well as segmental 40 and 50% mid AV groove stenoses. A cardiothoracic consultation has been requested with Dr. Laneta Simmers for the consideration of coronary artery bypass grafting surgery. Currently, the patient denies chest pain or shortness of breath.  Of note, patient was last seen by Dr. Tresa Endo in 2018.  Current Activity/ Functional Status: Patient is independent with mobility/ambulation, transfers, ADL's, IADL's.   Zubrod Score: At the time of surgery this patient's most appropriate activity status/level should be described as:     0    Normal activity, no symptoms     1    Restricted in physical strenuous activity but ambulatory, able to do out light work     2    Ambulatory and capable of self care, unable to do work activities, up and about more than 50%  of the time                                3    Only limited self care, in bed greater than 50% of waking hours     4    Completely disabled, no self care, confined to bed or chair     5    Moribund  Past Medical History:  Diagnosis Date  . Anemia 10/2015  Acute Blood Loss  . Arthritis    "feel like I have it all over" (08/28/2015)  . CHF (congestive heart failure) (HCC)   . Complication of anesthesia    DIFFICULT WAKING "only when I was smoking; no problems since I quit"  . Coronary artery disease   . Family history of adverse reaction to anesthesia    sister slow to wake up  . GERD (gastroesophageal reflux disease)    takes Protonix daily   . Hip bursitis   . History of blood transfusion    10/2015  . Hyperlipidemia LDL goal < 70 06/28/2013   takes Atorvastatin daily  . Hypertension    takes Metoprolol and Imdur daily  . Hypoxia 01/2018  . Malnutrition (HCC)   . Migraine    "none in a long time" (08/28/2015)  . Myocardial infarction (HCC) 2011  .  Neuromuscular disorder (HCC)    DIABETIC NEUROPATHY  . Osteomyelitis (HCC) 2017   Left foot  . PAD (peripheral artery disease) (HCC)   . Peripheral vascular disease (HCC)   . Respiratory failure (HCC) 10/2015   Acute Hypoxia- acute pulmonary edema 11/13/2015  . Septic shock (HCC) 10/2015  . Stroke (HCC)   . Type II diabetes mellitus (HCC)    takes Lantus nightly.Average fasting blood sugar runs 80-90  Type II    Past Surgical History:  Procedure Laterality Date  . ABDOMINAL HYSTERECTOMY    . AMPUTATION Right 10/22/2016   Procedure: AMPUTATION DIGIT RIGHT TOES 1-3 POSSIBLE TRANSMETATARSAL;  Surgeon: Chuck Hint, MD;  Location: [facility] OR;  Service: Vascular;  Laterality: Right;  . APPENDECTOMY    . ATHERECTOMY N/A 06/04/2011   Procedure: ATHERECTOMY;  Surgeon: Runell Gess, MD;  Location: [facility] CATH LAB;  Service: Cardiovascular;  Laterality: N/A;  . CARDIAC CATHETERIZATION  10/13/2009   95% stenosis in the AV groove circumflex and 95% ostial stenosis in small OM3. A 3x35mm drug-eluting Promus stent inserted ito the circumflex. Dilatated with a 3.25x36mm noncompliant Quantum balloon within entire segment. The entire region was reduced to 0% and brisk TIMI3 flow.  . CAROTID DUPLEX  03/19/2011   Right ICA-demonstrates complete occlusion. Left ICA-demonstrates a small amount of fibrous plaque.  Marland Kitchen CATARACT EXTRACTION W/ INTRAOCULAR LENS IMPLANT Right   . CESAREAN SECTION  1990  . CORONARY ANGIOPLASTY    . ENDARTERECTOMY FEMORAL Left 09/05/2015   Procedure: ENDARTERECTOMY FEMORAL WITH PROFUNDOPLASTY;  Surgeon: Chuck Hint, MD;  Location: [facility] OR;  Service: Vascular;  Laterality: Left;  Left common femoral artery vein patch using left saphenous vien  . ENDARTERECTOMY FEMORAL Right 10/22/2016   Procedure: ENDARTERECTOMY RIGHT COMMON FEMORAL;  Surgeon: Chuck Hint, MD;  Location: [facility] OR;  Service: Vascular;  Laterality: Right;  . FEMORAL-POPLITEAL BYPASS GRAFT Left 11/02/2015    Procedure: BYPASS GRAFT FEMORAL-POPLITEAL ARTERY VS FEMORAL-TIBIAL ARTERY BYPASS;  Surgeon: Chuck Hint, MD;  Location: [facility] OR;  Service: Vascular;  Laterality: Left;  . FEMORAL-POPLITEAL BYPASS GRAFT Right 10/22/2016   Procedure: BYPASS GRAFT RIGHT FEMORAL- BELOW KNEE POPLITEAL ARTERY WITH VEIN;  Surgeon: Chuck Hint, MD;  Location: [facility] OR;  Service: Vascular;  Laterality: Right;  . I&D EXTREMITY Left 11/10/2015   Procedure: Debridement Left Foot Ulcer, Application  Wound VAC;  Surgeon: [name], MD;  Location: [facility] OR;  Service: Orthopedics;  Laterality: Left;  . ILIAC ARTERY STENT Left 08/28/2015   common  . INTRAOPERATIVE ARTERIOGRAM Left 09/05/2015   Procedure: INTRA OPERATIVE ARTERIOGRAM;  Surgeon: Chuck Hint,  MD;  Location: [facility] OR;  Service: Vascular;  Laterality: Left;  . INTRAOPERATIVE ARTERIOGRAM Left 11/02/2015   Procedure: INTRA OPERATIVE ARTERIOGRAM;  Surgeon: Chuck Hint, MD;  Location: [facility] OR;  Service: Vascular;  Laterality: Left;  . INTRAOPERATIVE ARTERIOGRAM Right 10/22/2016   Procedure: INTRA OPERATIVE ARTERIOGRAM;  Surgeon: Chuck Hint, MD;  Location: [facility] OR;  Service: Vascular;  Laterality: Right;  . LEXISCAN MYOVIEW  10/25/2010   Moderate perfusion defect due to infarct/scar with mild perinfarct ischemia seen in the Basal Inferolateral, Basal Anterolateral, Mid Inferolateral, and Mid Anterolateral regions. Post-stress EF is 50%.  . LOWER EXTREMITY ANGIOGRAPHY N/A 10/17/2016   Procedure: Lower Extremity Angiography;  Surgeon: Runell Gess, MD;  Location: [facility] INVASIVE CV LAB;  Service: Cardiovascular;  Laterality: N/A;  . OVARY SURGERY  1983?   "ruptured"  . PERIPHERAL VASCULAR ANGIOGRAM  01/26/2010   High-grade SFA disease: left greater than right. Left SFA would require fem-pop bypass grafting. Right SFA could be stented but might require Diamondback Orbital atherectomy.  Marland Kitchen PERIPHERAL VASCULAR ANGIOGRAM  02/23/2010   Stealth  Predator orbital rotational atherectomy performed on SFA & Popliteal up to 90,000 RPM. Stenting using overlapping 5x138mm and 5x9mm Absolute Pro Nitinol self-expanding stents beginning just at the knee up to the mid SFA resulting in reduction of 90-95% calcified SFA & Popliteal stenosis to 0. Stenting performed on the distal common & proximal iliac artery with a 10x4 Absolute Pro- 70-0%.  Marland Kitchen PERIPHERAL VASCULAR ANGIOGRAM  06/17/2010   PTA performed to the right external iliac artery stent using a 5x100 balloon at 10 atmospheres. Stenting performed using a 6x18 Genesis on Opta balloon. Postdilatation with a 7x2 balloon resulting in a 95% "in-stent" stenosis to 0% residual.  . PERIPHERAL VASCULAR ANGIOGRAM  06/04/2011   Bilateral total SFAs not percutaneously addressable. Good canidate for femoropopliteal bypass grafting  . PERIPHERAL VASCULAR ANGIOGRAM  08/28/2015  . PERIPHERAL VASCULAR CATHETERIZATION N/A 08/28/2015   Procedure: Lower Extremity Angiography;  Surgeon: Runell Gess, MD;  Location: [facility] INVASIVE CV LAB;  Service: Cardiovascular;  Laterality: N/A;  . PERIPHERAL VASCULAR CATHETERIZATION N/A 08/28/2015   Procedure: Abdominal Aortogram;  Surgeon: Runell Gess, MD;  Location: [facility] INVASIVE CV LAB;  Service: Cardiovascular;  Laterality: N/A;  . PERIPHERAL VASCULAR CATHETERIZATION Left 08/28/2015   Procedure: Peripheral Vascular Intervention;  Surgeon: Runell Gess, MD;  Location: [facility] INVASIVE CV LAB;  Service: Cardiovascular;  Laterality: Left;  common iliac  . PERIPHERAL VASCULAR CATHETERIZATION Left 08/28/2015   Procedure: Peripheral Vascular Atherectomy;  Surgeon: Runell Gess, MD;  Location: [facility] INVASIVE CV LAB;  Service: Cardiovascular;  Laterality: Left;  common iliac  . PERIPHERAL VASCULAR CATHETERIZATION N/A 09/28/2015   Procedure: Lower Extremity Angiography;  Surgeon: Runell Gess, MD;  Location: [facility] INVASIVE CV LAB;  Service: Cardiovascular;  Laterality: N/A;  . PERIPHERAL  VASCULAR CATHETERIZATION Left 09/28/2015   Procedure: Peripheral Vascular Intervention;  Surgeon: Runell Gess, MD;  Location: [facility] INVASIVE CV LAB;  Service: Cardiovascular;  Laterality: Left CFA  PCI with 9 mm x 4 cm Abbott nitinol absolute Pro self-expanding stent     . SKIN SPLIT GRAFT Left 12/01/2015   Procedure: LEFT FOOT SKIN GRAFT AND VAC;  Surgeon: [name], MD;  Location: [facility] OR;  Service: Orthopedics;  Laterality: Left;  . THROMBECTOMY FEMORAL ARTERY Right 10/22/2016   Procedure: THROMBECTOMY FEMORAL ARTERY;  Surgeon: Chuck Hint, MD;  Location: [facility] OR;  Service: Vascular;  Laterality: Right;  . TRANSTHORACIC ECHOCARDIOGRAM  10/17/2009   EF 45-50%, moderate hypokinesis of the entire inferolateral myocardium, mild concentric hypertrophy and mild regurg of the mitral valva.  Marland Kitchen VEIN HARVEST Left 11/02/2015   Procedure: LEFT GREATER SAPHENOUS VEIN HARVEST;  Surgeon: Chuck Hint, MD;  Location: [facility] OR;  Service: Vascular;  Laterality: Left;    Social History   Tobacco Use  Smoking Status Former Smoker  . Packs/day: 1.50  . Years: 41.00  . Pack years: 61.50  . Types: Cigarettes  . Last attempt to quit: 10/12/2009  . Years since quitting: 8.5  Smokeless Tobacco Never Used    Social History   Substance and Sexual Activity  Alcohol Use No     Allergies  Allergen Reactions  . Doxycycline Other (See Comments)    lethargy  . Hydrochlorothiazide Other (See Comments)    lethargic   . Latex Rash  . Penicillins Swelling and Rash    Pt states she has tolerated Keflex in the past without problems. States she may have tolerated Augmentin in the past but it caused GI upset. Has patient had a PCN reaction causing immediate rash, facial/tongue/throat swelling, SOB or lightheadedness with hypotension: Yes Has patient had a PCN reaction causing severe rash involving mucus membranes or skin necrosis: No Has patient had a PCN reaction that required hospitalization No  Has patient had a PCN reaction occurring within the last 10 years: No    Current Facility-Administered Medications  Medication Dose Route Frequency Provider Last Rate Last Dose  . 0.9 %  sodium chloride infusion   Intravenous Continuous Lennette Bihari, MD 100 mL/hr at 05/11/18 1001    . 0.9 %  sodium chloride infusion  250 mL Intravenous PRN Lennette Bihari, MD      . acetaminophen (TYLENOL) tablet 650 mg  650 mg Oral Q4H PRN Lennette Bihari, MD   650 mg at 05/11/18 1120  . aspirin EC tablet 81 mg  81 mg Oral Daily Noralee Stain, DO   81 mg at 05/10/18 1011  . atorvastatin (LIPITOR) tablet 80 mg  80 mg Oral q1800 Noralee Stain, DO   80 mg at 05/10/18 1738  . diazepam (VALIUM) tablet 5 mg  5 mg Oral Q6H PRN Lennette Bihari, MD      . gabapentin (NEURONTIN) capsule 100 mg  100 mg Oral TID Alessandra Bevels, MD   100 mg at 05/10/18 2145  . heparin ADULT infusion 100 units/mL (25000 units/237mL sodium chloride 0.45%)  1,100 Units/hr Intravenous Continuous Mosetta Anis, RPH      . hydrALAZINE (APRESOLINE) injection 10 mg  10 mg Intravenous Q20 Min PRN Lennette Bihari, MD      . hydrALAZINE (APRESOLINE) injection 5 mg  5 mg Intravenous Q6H PRN Alessandra Bevels, MD      . insulin aspart (novoLOG) injection 0-5 Units  0-5 Units Subcutaneous QHS Noralee Stain, DO   4 Units at 05/10/18 2145  . insulin aspart (novoLOG) injection 0-9 Units  0-9 Units Subcutaneous TID WC Noralee Stain, DO   5 Units at 05/10/18 1738  . insulin glargine (LANTUS) injection 35 Units  35 Units Subcutaneous QHS Alessandra Bevels, MD      . isosorbide mononitrate (IMDUR) 24 hr tablet 60 mg  60 mg Oral Daily Nicki Guadalajara A, MD      . labetalol (NORMODYNE,TRANDATE) injection 10 mg  10 mg Intravenous Q10 min PRN Lennette Bihari, MD      . metoprolol succinate (TOPROL-XL) 24 hr tablet 75 mg  75 mg Oral Daily Alessandra BevelsKamineni, Neelima, MD      . ondansetron (ZOFRAN) injection 4 mg  4 mg Intravenous Q6H PRN Lennette BihariKelly, Thomas A, MD       . oxyCODONE (Oxy IR/ROXICODONE) immediate release tablet 5 mg  5 mg Oral Q4H PRN Noralee Stainhoi, Jennifer, DO      . pantoprazole (PROTONIX) EC tablet 40 mg  40 mg Oral Daily Noralee Stainhoi, Jennifer, DO   40 mg at 05/10/18 1016  . sodium chloride flush (NS) 0.9 % injection 3 mL  3 mL Intravenous Q12H Nicki GuadalajaraKelly, Thomas A, MD      . sodium chloride flush (NS) 0.9 % injection 3 mL  3 mL Intravenous PRN Lennette BihariKelly, Thomas A, MD        Medications Prior to Admission  Medication Sig Dispense Refill Last Dose  . acetaminophen (TYLENOL) 325 MG tablet Take 650 mg by mouth every 6 (six) hours as needed for mild pain, fever or headache.   05/08/2018 at 0200  . aspirin EC 81 MG tablet Take 81 mg by mouth daily.   05/07/2018 at Unknown time  . atorvastatin (LIPITOR) 80 MG tablet Take 80 mg by mouth. for hyperlipidemia   05/07/2018 at Unknown time  . clopidogrel (PLAVIX) 75 MG tablet Take 75 mg by mouth. for PVD   05/07/2018 at Unknown time  . furosemide (LASIX) 40 MG tablet Take 1 tablet (40 mg total) by mouth 2 (two) times daily. 60 tablet 0 05/07/2018 at Unknown time  . gabapentin (NEURONTIN) 300 MG capsule Take 300 mg by mouth 3 (three) times daily.    05/07/2018 at Unknown time  . isosorbide mononitrate (IMDUR) 30 MG 24 hr tablet Take 90 mg by mouth daily. (Take along with 60 mg to equal 90 mg daily)   05/07/2018 at Unknown time  . isosorbide mononitrate (IMDUR) 60 MG 24 hr tablet Take 90 mg by mouth daily. (Take along with 30 mg to equal 90 mg daily)   05/07/2018 at Unknown time  . LEVEMIR FLEXTOUCH 100 UNIT/ML Pen Inject 35 Units into the skin at bedtime.   3 05/07/2018 at Unknown time  . losartan (COZAAR) 100 MG tablet TAKE 1 TABLET BY MOUTH EVERY DAY (Patient taking differently: Take 100 mg by mouth daily. ) 90 tablet 1 05/07/2018 at Unknown time  . metFORMIN (GLUCOPHAGE) 1000 MG tablet Take 1,000 mg by mouth 2 (two) times daily with a meal.   05/07/2018 at Unknown time  . metoprolol succinate (TOPROL-XL) 50 MG 24 hr tablet TAKE 1 AND 1/2 TABLETS BY  MOUTH DAILY (Patient taking differently: Take 75 mg by mouth daily. ) 45 tablet 1 05/07/2018 at 1700  . pantoprazole (PROTONIX) 40 MG tablet TAKE 1 TABLET BY MOUTH DAILY (Patient taking differently: Take 40 mg by mouth daily. ) 30 tablet 0 05/07/2018 at Unknown time  . potassium chloride (K-DUR,KLOR-CON) 10 MEQ tablet Take 10 mEq by mouth daily.    05/07/2018 at Unknown time  . VENTOLIN HFA 108 (90 Base) MCG/ACT inhaler Inhale 1 puff into the lungs every 4 (four) hours as needed for shortness of breath.   05/08/2018 at Unknown time    Family History  Problem Relation Age of Onset  . Hypertension Mother   . Heart failure Mother   . Heart failure Father   . Stroke Father   . Diabetes Father    Review of Systems:       Cardiac Review of Systems: Y or  [  N  ]= no  Chest Pain [ N   ]  Resting SOB [ Y  ] Exertional SOB  [ Y ]     Pedal Edema [ Y  ]    Palpitations [ N ] Syncope  [ N ]   Presyncope [ N  ]  General Review of Systems: [Y] = yes [ N ]=no Constitional:  nausea Klaus.Mock  ]; night sweats Klaus.Mock  ]; fever [ N ]; or chills [ N ]                                                               Dental: Last Dentist visit: "a long time ago". She had up[per dentures  Eye : cataract [Left eye  ];  Amaurosis fugax[ N ]; Resp: cough Klaus.Mock  ];  wheezing[ N ];  hemoptysis[N  ]; Recently had pneumonia (Jan 2020) GI:  vomiting[ Y ];  dysphagia[ N ]; melena[ N ];  hematochezia [ N ];  GU:  hematuria[ N ];                Skin: rash[ Y-left arm ];,peripheral edema[Y  ];   Heme/Lymph:    anemia[ Y ];  Neuro: stroke[ Y ];    seizures[N  ];     Endocrine: diabetes[ Y ];  thyroid dysfunction[N  ];              Physical Exam: BP (!) 123/102 (BP Location: Left Arm)   Pulse 72   Temp (!) 97.5 F (36.4 C) (Axillary)   Resp 20   Ht 5\' 4"  (1.626 m)   Wt 82.3 kg   SpO2 96%   BMI 31.15 kg/m    General appearance: alert, cooperative and no distress Head: Normocephalic, without obvious abnormality, atraumatic Neck:  no carotid bruit and supple, symmetrical, trachea midline Resp: clear to auscultation bilaterally Cardio: RRR, systolic murmur (grade II/VI) at upper sternal border GI: Soft, non tender, sporadic bowel sounds present Extremities: Lichenification LE (thickened skin), trace LE edema. Right radial TR band in place. Rash left forearm. Well healed scars from vascular surgery both LEs and well healed right toes amputation site Neurologic: Grossly normal  Diagnostic Studies & Laboratory data: Result status: Final result    ECHOCARDIOGRAM LIMITED REPORT       Patient Name:   [name] [name] Date of Exam: 05/09/2018 Medical Rec #:  [id number]    Height:       64.0 in Accession #:    [id number]   Weight:       181.0 lb Date of Birth:  April 08, 1950    BSA:          1.88 m Patient Age:    68 years     BP:           100/78 mmHg Patient Gender: F            HR:           73 bpm. Exam Location:  Inpatient    Procedure: Limited Echo  Indications:    elevated troponin evaluation   History:        Patient has prior history of Echocardiogram examinations, most                 recent 02/13/2018.  CHF Previous Myocardial Infarction and CAD                 Stroke Risk Factors: Diabetes, Hypertension and Dyslipidemia.   Sonographer:    Celene Skeen RDCS (AE) Referring Phys: [id number] PETER C NISHAN  IMPRESSIONS    1. The left ventricle had a visually estimated ejection fraction of of 45%. The cavity size was normal. Left ventricular diastolic Doppler parameters are consistent with pseudonormal. Elevated left ventricular end-diastolic pressure The E/e' is 16.1.  Left ventricular diffuse hypokinesis.  2. The right ventricle has normal systolc function. The cavity was normal. There is no increase in right ventricular wall thickness. Right ventricular systolic pressure could not be assessed.  3. The mitral valve is abnormal. Mild thickening of the mitral valve leaflet. There is moderate mitral annular  calcification present.  4. The inferior vena cava was dilated in size with <50% respiratory variability.  5. When compared to the prior study: Side by side comparison of images performed to 02/13/2018 exam. No significant change in LV function or wall motion.  FINDINGS  Left Ventricle: The left ventricle has a visually estimated ejection fraction of of 45%. The cavity size was normal. There is no increase in left ventricular wall thickness. Left ventricular diastolic Doppler parameters are consistent with pseudonormal  (grade II). Elevated left ventricular end-diastolic pressure The E/e' is 09.6. Left ventricular diffuse hypokinesis. Variability in regional wall motion, with relative hypokinesis of the inferolateral wall, unchanged as compared to prior exam 02/13/2018.   Right Ventricle: The right ventricle has normal systolic function. The cavity was normal. There is no increase in right ventricular wall thickness. Right ventricular systolic pressure could not be assessed. Left Atrium: Left atrial size was mildly dilated. Right Atrium: Right atrial size was normal in size. Right atrial pressure is estimated at 10 mmHg. Interatrial Septum: The interatrial septum was not well visualized. Pericardium: There is no evidence of pericardial effusion. Mitral Valve: The mitral valve is abnormal. Mild thickening of the mitral valve leaflet. There is moderate mitral annular calcification present. Mitral valve regurgitation is trivial by color flow Doppler. Mean mitral diastolic gradient 2 mmHg at HR 73  bpm. Tricuspid Valve: The tricuspid valve was normal in structure. Tricuspid valve regurgitation is trivial by color flow Doppler. Aortic Valve: Moderate sclerosis of the aortic valve. Aortic valve regurgitation was not visualized by color flow Doppler. Pulmonic Valve: The pulmonic valve was grossly normal. Pulmonic valve regurgitation is trivial by color flow Doppler. Venous: The inferior vena cava is dilated  in size with less than 50% respiratory variability. Compared to previous exam: Side by side comparison of images performed to 02/13/2018 exam. No significant change in LV function or wall motion.       Physicians Referring Physician Case Authorizing Physician  Lennette Bihari, MD (Primary)    Procedures   LEFT HEART CATH AND CORONARY ANGIOGRAPHY  Conclusion     Ost Cx to Prox Cx lesion is 85% stenosed.  Mid LM to Dist LM lesion is 80% stenosed.  Mid Cx to Dist Cx lesion is 50% stenosed.  Mid Cx lesion is 40% stenosed.  Prox LAD to Mid LAD lesion is 30% stenosed.  LV end diastolic pressure is moderately elevated.  There is mild to moderate left ventricular systolic dysfunction.   Significant coronary calcification with eccentric 80% mid distal left main stenosis, mild diffuse irregularity of the LAD with 30% stenoses, and a large dominant left calcified left circumflex coronary artery with previously  placed stent in the proximal circumflex with focal mid instent 85% stenosis, as well as segmental 40 and 50% mid AV groove stenoses.  The RCA is a small nondominant normal vessel.  Moderate LV dysfunction with an EF estimated at 40 to 45% with hypocontractility involving the mid anterolateral wall and basal mid inferior wall.  LVEDP elevated at 28 mmHg.  RECOMMENDATION: Surgical consultation for CABG revascularization.   Findings   Diagnostic  Dominance: Left  Left Main  Mid LM to Dist LM lesion 80% stenosed  Mid LM to Dist LM lesion is 80% stenosed.  Left Anterior Descending  There is mild diffuse disease throughout the vessel.  Prox LAD to Mid LAD lesion 30% stenosed  Prox LAD to Mid LAD lesion is 30% stenosed.  Left Circumflex  Ost Cx to Prox Cx lesion 85% stenosed  Ost Cx to Prox Cx lesion is 85% stenosed. The lesion was previously treated.  Mid Cx lesion 40% stenosed  Mid Cx lesion is 40% stenosed.  Mid Cx to Dist Cx lesion 50% stenosed  Mid Cx to Dist Cx lesion  is 50% stenosed.  Intervention   No interventions have been documented.  Wall Motion   Resting               Left Heart   Left Ventricle The left ventricular size is normal. There is mild to moderate left ventricular systolic dysfunction. LV end diastolic pressure is moderately elevated. There is mild to moderate LV dysfunction with EF estimate approximately 40 to 45%.  The LVEDP 28 mm.  Coronary Diagrams   Diagnostic  Dominance: Left         Recent Radiology Findings:  CLINICAL DATA:  Shortness of breath. Nausea. Swelling of the lower extremities.  EXAM: PORTABLE CHEST 1 VIEW  COMPARISON:  02/19/2018  FINDINGS: Artifact overlies the chest. Chronic cardiomegaly and aortic atherosclerosis. Pulmonary venous hypertension with interstitial edema. No visible effusion.  IMPRESSION: Congestive heart failure with cardiomegaly and interstitial edema.   Electronically Signed   By: Paulina Fusi M.D.   On: 05/08/2018 08:11      I have independently reviewed the above radiologic studies and discussed with the patient   Recent Lab Findings: Lab Results  Component Value Date   WBC 9.6 05/11/2018   HGB 9.5 (L) 05/11/2018   HCT 27.4 (L) 05/11/2018   PLT 193 05/11/2018   GLUCOSE 224 (H) 05/11/2018   CHOL 211 (H) 05/10/2018   TRIG 386 (H) 05/10/2018   HDL 30 (L) 05/10/2018   LDLCALC 104 (H) 05/10/2018   ALT 14 02/14/2018   AST 34 02/14/2018   NA 138 05/11/2018   K 4.2 05/11/2018   CL 108 05/11/2018   CREATININE 1.00 05/11/2018   BUN 22 05/11/2018   CO2 21 (L) 05/11/2018   TSH 1.05 08/24/2015   INR 1.25 02/14/2018   HGBA1C 8.6 (H) 02/18/2018   Assessment / Plan:   1. S/p NSTEMI, coronary artery disease-She is unlikely a candidate for coronary artery bypass grafting surgery as she has multiple co morbidities which put her overall a very high risk for surgery and saphenous vein has been used for previous vascular surgeries. 2. History of hypertension-on Imdur  90 mg daily, Losartan 100 mg daily, and Toprol XL 75 mg daily prior to admission 3. History of hyperlipidemia-on Atorvastatin prior to admission 4. History of DM (Type II)-on Metformin 1000 mg bid and Insulin prior to admission 5. Chronic kidney disease (stage III)-creatinine 1.48 upon admission 6. History  of stroke, PVD   I  spent 20 minutes counseling the patient face to face.   Donielle.Joycelyn Man PA-C 05/11/2018 12:23 PM    Chart reviewed, patient examined, agree with above. This 68 year old woman has a long history of extensive cardiovascular disease including coronary artery disease status post DES to the left circumflex in 2011, known right internal carotid artery occlusion with extensive calcific aortic arch disease involving the origin of all 3 great vessels with prior stroke, severe peripheral vascular disease status post multiple PCI's and subsequent bilateral femoral-popliteal bypass grafting with saphenous vein taken from both legs, status post right transmetatarsal amputation, stage III chronic kidney disease, poorly controlled type 2 diabetes, remote smoking and COPD, and hyperlipidemia.  She now presents with a non-ST segment elevation MI with exertional pain in both arms and congestive heart failure with interstitial edema.  Cardiac catheterization shows severe diffuse calcification of the left main, proximal LAD, and proximal left circumflex coronary arteries with 80% left main stenosis as well as 85% in-stent stenosis in the left circumflex.  I think she would be a poor candidate for coronary bypass graft surgery given her multiple comorbidities in addition to having both saphenous veins used for peripheral bypasses.  A left internal mammary artery graft to the LAD could be considered but she is a morbidly obese poorly controlled diabetic with extensive vascular disease and would likely have a small, suboptimal left internal mammary artery and there is no alternative conduit since the  saphenous veins have been used.  Her operative morbidity and mortality would be high and I think should be best served with medical treatment.  I discussed all this with her and she seems to understand.

## 2018-05-11 NOTE — Progress Notes (Signed)
TR BAND REMOVAL  LOCATION:    right radial  DEFLATED PER PROTOCOL:    Yes.    TIME BAND OFF / DRESSING APPLIED:    1230   SITE UPON ARRIVAL:    Level 0  SITE AFTER BAND REMOVAL:    Level 0  CIRCULATION SENSATION AND MOVEMENT:    Within Normal Limits   Yes.    Bedrest completed at 1400. 1430 patient up to bathroom and then sitting in lounge chair. Tolerating activity well. Cath sites remain stable, no change with activity.

## 2018-05-11 NOTE — Progress Notes (Signed)
ANTICOAGULATION CONSULT NOTE  Pharmacy Consult for heparin Indication: chest pain/ACS  Allergies  Allergen Reactions  . Doxycycline Other (See Comments)    lethargy  . Hydrochlorothiazide Other (See Comments)    lethargic   . Latex Rash  . Penicillins Swelling and Rash    Pt states she has tolerated Keflex in the past without problems. States she may have tolerated Augmentin in the past but it caused GI upset. Has patient had a PCN reaction causing immediate rash, facial/tongue/throat swelling, SOB or lightheadedness with hypotension: Yes Has patient had a PCN reaction causing severe rash involving mucus membranes or skin necrosis: No Has patient had a PCN reaction that required hospitalization No Has patient had a PCN reaction occurring within the last 10 years: No    Patient Measurements: Height: 5\' 4"  (162.6 cm) Weight: 181 lb 8 oz (82.3 kg) IBW/kg (Calculated) : 54.7 Heparin Dosing Weight: 72kg  Vital Signs: Temp: 98.1 F (36.7 C) (04/13 0707) Temp Source: Oral (04/13 0707) BP: 146/57 (04/13 1025) Pulse Rate: 65 (04/13 1025)  Labs: Recent Labs    05/08/18 2232  05/09/18 0450 05/09/18 1134 05/10/18 0231 05/10/18 1134 05/11/18 0247  HGB  --   --  9.8*  --  9.2*  --  9.5*  HCT  --   --  30.0*  --  26.8*  --  27.4*  PLT  --   --  232  --  192  --  193  HEPARINUNFRC  --    < > 0.33 0.31 0.18* 0.42 0.32  CREATININE  --   --  2.07*  --  1.39*  --  1.00  TROPONINI 7.63*  --  7.09* 3.72*  --   --   --    < > = values in this interval not displayed.    Estimated Creatinine Clearance: 55.8 mL/min (by C-G formula based on SCr of 1 mg/dL).  Assessment: 27 yoF admitted as NSTEMI and now s/p LHC revealing 3-vessel CAD. Pharmacy consulted to resume IV heparin in 8hr while awaiting CT surgery evaluation for CABG. Heparin has remained therapeutic with stable CBC on 1100 units/h.   Goal of Therapy:  Heparin level 0.3-0.7 units/ml Monitor platelets by anticoagulation  protocol: Yes   Plan:  -Resume IV heparin at 1100 units/hr with no bolus at 1800 -Recheck heparin level in 6hr  Fredonia Highland, PharmD, BCPS Clinical Pharmacist [phone] Please check AMION for all [facility] numbers 05/11/2018

## 2018-05-11 NOTE — Progress Notes (Signed)
Progress Note  Patient Name: [name] [name] Date of Encounter: 05/11/2018  Primary Cardiologist: Nicki Guadalajara, MD   Subjective   No chest pain or shortness of breath.  Inpatient Medications    Scheduled Meds: . aspirin EC  81 mg Oral Daily  . atorvastatin  80 mg Oral q1800  . gabapentin  100 mg Oral TID  . insulin aspart  0-5 Units Subcutaneous QHS  . insulin aspart  0-9 Units Subcutaneous TID WC  . insulin glargine  35 Units Subcutaneous QHS  . isosorbide mononitrate  60 mg Oral Daily  . metoprolol succinate  75 mg Oral Daily  . pantoprazole  40 mg Oral Daily  . sodium chloride flush  3 mL Intravenous Q12H   Continuous Infusions: . sodium chloride 100 mL/hr at 05/11/18 1001  . sodium chloride    . heparin     PRN Meds: sodium chloride, acetaminophen, diazepam, hydrALAZINE, hydrALAZINE, labetalol, ondansetron (ZOFRAN) IV, oxyCODONE, sodium chloride flush   Vital Signs    Vitals:   05/11/18 1045 05/11/18 1100 05/11/18 1115 05/11/18 1130  BP: (!) 153/58 (!) 84/69 114/70 (!) 123/102  Pulse: 65 99 (!) 147 72  Resp: 14 (!) 23 16 20   Temp:    (!) 97.5 F (36.4 C)  TempSrc:    Axillary  SpO2: 98% 91% 91% 96%  Weight:      Height:        Intake/Output Summary (Last 24 hours) at 05/11/2018 1306 Last data filed at 05/11/2018 1241 Gross per 24 hour  Intake 1149.5 ml  Output -  Net 1149.5 ml   Last 3 Weights 05/11/2018 05/10/2018 05/09/2018  Weight (lbs) 181 lb 8 oz 182 lb 1.6 oz 181 lb  Weight (kg) 82.328 kg 82.6 kg 82.1 kg      Telemetry    Normal sinus rhythm- Personally Reviewed   Physical Exam  Alert oriented woman in no distress GEN: No acute distress.   Neck: No JVD Cardiac: RRR, 2/6 systolic ejection murmur at the right upper sternal border Respiratory: Clear to auscultation bilaterally. GI: Soft, nontender, non-distended  MS: No edema; No deformity.  Right radial cath site is clear with a TR band in place Neuro:  Nonfocal except for mild word-finding  deficit noted only when she tried to say 'heart attack.' Otherwise no issues noted during extensive conversation. Psych: Normal affect  Skin: There is a petechial rash along the left forearm  Labs    Chemistry Recent Labs  Lab 05/09/18 0450 05/10/18 0231 05/11/18 0247  NA 137 139 138  K 4.3 4.3 4.2  CL 103 106 108  CO2 20* 23 21*  GLUCOSE 127* 84 224*  BUN 37* 29* 22  CREATININE 2.07* 1.39* 1.00  CALCIUM 8.4* 8.5* 8.4*  GFRNONAA 24* 39* 58*  GFRAA 28* 45* >60  ANIONGAP 14 10 9      Hematology Recent Labs  Lab 05/09/18 0450 05/10/18 0231 05/11/18 0247  WBC 9.3 8.5 9.6  RBC 3.53* 3.16* 3.24*  HGB 9.8* 9.2* 9.5*  HCT 30.0* 26.8* 27.4*  MCV 85.0 84.8 84.6  MCH 27.8 29.1 29.3  MCHC 32.7 34.3 34.7  RDW 13.8 13.9 13.7  PLT 232 192 193    Cardiac Enzymes Recent Labs  Lab 05/08/18 1726 05/08/18 2232 05/09/18 0450 05/09/18 1134  TROPONINI 9.92* 7.63* 7.09* 3.72*   No results for input(s): TROPIPOC in the last 168 hours.   BNP Recent Labs  Lab 05/08/18 0720  BNP 624.8*  DDimer No results for input(s): DDIMER in the last 168 hours.   Radiology    No results found.  Cardiac Studies   2D echocardiogram 05/09/2018: IMPRESSIONS    1. The left ventricle had a visually estimated ejection fraction of of 45%. The cavity size was normal. Left ventricular diastolic Doppler parameters are consistent with pseudonormal. Elevated left ventricular end-diastolic pressure The E/e' is 32.2.  Left ventricular diffuse hypokinesis.  2. The right ventricle has normal systolc function. The cavity was normal. There is no increase in right ventricular wall thickness. Right ventricular systolic pressure could not be assessed.  3. The mitral valve is abnormal. Mild thickening of the mitral valve leaflet. There is moderate mitral annular calcification present.  4. The inferior vena cava was dilated in size with <50% respiratory variability.  5. When compared to the prior study:  Side by side comparison of images performed to 02/13/2018 exam. No significant change in LV function or wall motion.  Patient Profile     68 y.o. female with known coronary artery disease and peripheral arterial disease presents with non-STEMI, troponin peak 9.92 mg/dL  Assessment & Plan    1.  Non-STEMI: The patient underwent cardiac catheterization this morning.  I reviewed her study personally.  She has severe left main disease as well as severe stenosis of a dominant left circumflex.  Cardiac surgical consultation has been placed.  The patient is chest pain-free at present.  2.  Acute diastolic heart failure: Patient appears stable with no current evidence of volume overload  3.  Hypertension: Blood pressure controlled on current medical therapy  4.  Mixed hyperlipidemia: The patient is treated with a high intensity statin drug  5.  Expressive aphasia: At the time of my evaluation this afternoon, the patient has very subtle expressive aphasia.  She conversed with me normally throughout our conversation.  She has no focal neurologic deficits and I specifically evaluated bilateral grip, cranial nerves, and lower extremity strength.  All of this appeared normal.  She had mild word finding difficulty when she was trying to describe her previous heart attack.  Otherwise she conversed normally.  We will keep a close eye on this.  I discussed with her nurse at the bedside.  For questions or updates, please contact [facility] Please consult [email or web address] for contact info under        Signed, Tonny Bollman, MD  05/11/2018, 1:06 PM

## 2018-05-11 NOTE — Progress Notes (Signed)
Site area: rt groin fa sheath Site Prior to Removal:  Level 0 Pressure Applied For: 20 minutes Manual:   yes Patient Status During Pull:  stable Post Pull Site:  Level 0 Post Pull Instructions Given:  yes Post Pull Pulses Present: rt dp dopplered Dressing Applied:  Gauze and tegaderm Bedrest begins @ 0955 Comments:

## 2018-05-12 ENCOUNTER — Encounter (HOSPITAL_COMMUNITY): Payer: Self-pay | Admitting: Cardiovascular Disease

## 2018-05-12 LAB — CBC
HCT: 26 % — ABNORMAL LOW (ref 36.0–46.0)
HCT: 28.1 % — ABNORMAL LOW (ref 36.0–46.0)
Hemoglobin: 8.9 g/dL — ABNORMAL LOW (ref 12.0–15.0)
Hemoglobin: 9.7 g/dL — ABNORMAL LOW (ref 12.0–15.0)
MCH: 29.2 pg (ref 26.0–34.0)
MCH: 29.5 pg (ref 26.0–34.0)
MCHC: 34.2 g/dL (ref 30.0–36.0)
MCHC: 34.5 g/dL (ref 30.0–36.0)
MCV: 85.2 fL (ref 80.0–100.0)
MCV: 85.4 fL (ref 80.0–100.0)
Platelets: 202 10*3/uL (ref 150–400)
Platelets: 208 10*3/uL (ref 150–400)
RBC: 3.05 MIL/uL — ABNORMAL LOW (ref 3.87–5.11)
RBC: 3.29 MIL/uL — ABNORMAL LOW (ref 3.87–5.11)
RDW: 13.9 % (ref 11.5–15.5)
RDW: 14 % (ref 11.5–15.5)
WBC: 9.3 10*3/uL (ref 4.0–10.5)
WBC: 9.5 10*3/uL (ref 4.0–10.5)
nRBC: 0 % (ref 0.0–0.2)
nRBC: 0 % (ref 0.0–0.2)

## 2018-05-12 LAB — GLUCOSE, CAPILLARY
Glucose-Capillary: 108 mg/dL — ABNORMAL HIGH (ref 70–99)
Glucose-Capillary: 153 mg/dL — ABNORMAL HIGH (ref 70–99)
Glucose-Capillary: 152 mg/dL — ABNORMAL HIGH (ref 70–99)
Glucose-Capillary: 202 mg/dL — ABNORMAL HIGH (ref 70–99)

## 2018-05-12 LAB — HEPARIN LEVEL (UNFRACTIONATED)
Heparin Unfractionated: 0.25 IU/mL — ABNORMAL LOW (ref 0.30–0.70)
Heparin Unfractionated: 0.3 IU/mL (ref 0.30–0.70)

## 2018-05-12 NOTE — Progress Notes (Signed)
ANTICOAGULATION CONSULT NOTE  Pharmacy Consult for heparin Indication: chest pain/ACS  Allergies  Allergen Reactions  . Doxycycline Other (See Comments)    lethargy  . Hydrochlorothiazide Other (See Comments)    lethargic   . Latex Rash  . Penicillins Swelling and Rash    Pt states she has tolerated Keflex in the past without problems. States she may have tolerated Augmentin in the past but it caused GI upset. Has patient had a PCN reaction causing immediate rash, facial/tongue/throat swelling, SOB or lightheadedness with hypotension: Yes Has patient had a PCN reaction causing severe rash involving mucus membranes or skin necrosis: No Has patient had a PCN reaction that required hospitalization No Has patient had a PCN reaction occurring within the last 10 years: No    Patient Measurements: Height: 5\' 4"  (162.6 cm) Weight: 185 lb 13.6 oz (84.3 kg) IBW/kg (Calculated) : 54.7 Heparin Dosing Weight: 72kg  Vital Signs: Temp: 98.1 F (36.7 C) (04/14 0807) Temp Source: Oral (04/14 0807) BP: 171/69 (04/14 0900) Pulse Rate: 81 (04/14 0900)  Labs: Recent Labs    05/09/18 1134  05/10/18 0231  05/11/18 0247 05/11/18 2347 05/12/18 0816  HGB  --    < > 9.2*  --  9.5* 8.9* 9.7*  HCT  --    < > 26.8*  --  27.4* 26.0* 28.1*  PLT  --    < > 192  --  193 202 208  HEPARINUNFRC 0.31  --  0.18*   < > 0.32 0.25* 0.30  CREATININE  --   --  1.39*  --  1.00  --   --   TROPONINI 3.72*  --   --   --   --   --   --    < > = values in this interval not displayed.    Estimated Creatinine Clearance: 56.5 mL/min (by C-G formula based on SCr of 1 mg/dL).  Assessment: 4 yoF admitted as NSTEMI and now s/p LHC revealing 3-vessel CAD. Pharmacy consulted to resume IV heparin in 8hr while determining appropriate revascularization options. TCTS recommending against CABG, cardiology to review cath films to assess for PCI. Heparin level therapeutic at 0.30, CBC stable, no S/Sx bleeding noted.   Goal of  Therapy:  Heparin level 0.3-0.7 units/ml Monitor platelets by anticoagulation protocol: Yes   Plan:  -Increase heparin slightly to 1250 units/hr to avoid subtherapeutic drop -Recheck heparin level with daily labs  Fredonia Highland, PharmD, BCPS Clinical Pharmacist [phone] Please check AMION for all [facility] Pharmacy numbers 05/12/2018

## 2018-05-12 NOTE — Evaluation (Signed)
Physical Therapy Evaluation Patient Details Name: [name] [name] MRN: [id number] DOB: [birth year] Today's Date: 05/12/2018   History of Present Illness  Patient is a 68 y/o female who presents with dyspnea and LE swelling. CXR- pulmonary edema. Found to have NSTEMI. s/p left cardiac cath 4/13-severe left main disease with severe stenosis of left circumflex. poor candidate for CABG- being managed medically. 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 Pt Mod I PTA using rollator for household distances and needs help getting in/out of tub at baseline from daughter. Tolerated ambulation with Min guard assist for balance/safety with 2/4 DOE. VSS throughout with Sp02 reading in high 80s-90s on RA however not sure of accuracy due to poor wave reading. Pt will have support of daughter at home. Will follow acutely to maximize independence and mobility prior to return home.     Follow Up Recommendations Supervision for mobility/OOB;Home health PT    Equipment Recommendations  None recommended by PT    Recommendations for Other Services       Precautions / Restrictions Precautions Precautions: Fall Precaution Comments: watch 02? Restrictions Weight Bearing Restrictions: No      Mobility  Bed Mobility               General bed mobility comments: Up in chair upon PT arrival.   Transfers Overall transfer level: Needs assistance   Transfers: Sit to/from Stand Sit to Stand: Min guard         General transfer comment: Min guard for safety. Cues for hand placement/technique.  Ambulation/Gait Ambulation/Gait assistance: Min guard Gait Distance (Feet): 120 Feet Assistive device: Rolling walker (2 wheeled) Gait Pattern/deviations: Step-through pattern;Decreased stride length;Trunk  flexed;Wide base of support Gait velocity: decreased   General Gait Details: Slow, mildly unsteady gait with RW for support; LLE in ER throughout with bil knee instability noted, but no buckling. 1 standing rest break. Sp02 in high 80s-90s on RA but poor wave form. 2/4 DOE. HR ranged from 80-low 100s bpm.  Stairs            Wheelchair Mobility    Modified Rankin (Stroke Patients Only)       Balance Overall balance assessment: Needs assistance Sitting-balance support: Feet supported Sitting balance-Leahy Scale: Fair     Standing balance support: During functional activity;Bilateral upper extremity supported Standing balance-Leahy Scale: Poor Standing balance comment: Requires BUe support in standing.                              Pertinent Vitals/Pain Pain Assessment: No/denies pain    Home Living Family/patient expects to be discharged to:: Private residence Living Arrangements: Children(daughter) Available Help at Discharge: Family;Available 24 hours/day Type of Home: House Home Access: Stairs to enter;Ramped entrance Entrance Stairs-Rails: Left Entrance Stairs-Number of Steps: 3 Home Layout: One level Home Equipment: Walker - 2 wheels;Walker - 4 wheels;Cane - single point;Bedside commode;Tub bench      Prior Function Level of Independence: Independent with assistive device(s)         Comments: Limited household ambulation with RW, independent in ADLs, assist with iADLs. Daughter helps get in/out of tub. Does some cooking.      Hand Dominance   Dominant Hand: Right    Extremity/Trunk Assessment   Upper Extremity Assessment  Upper Extremity Assessment: Defer to OT evaluation    Lower Extremity Assessment Lower Extremity Assessment: Generalized weakness       Communication   Communication: No difficulties  Cognition Arousal/Alertness: Awake/alert Behavior During Therapy: WFL for tasks assessed/performed Overall Cognitive Status: Within  Functional Limits for tasks assessed                                        General Comments General comments (skin integrity, edema, etc.): VSS throughout except possible drop in Sp02 but poor reading.    Exercises     Assessment/Plan    PT Assessment Patient needs continued PT services  PT Problem List Decreased strength;Decreased balance;Cardiopulmonary status limiting activity;Decreased mobility;Decreased activity tolerance       PT Treatment Interventions Functional mobility training;Balance training;Patient/family education;Gait training;Therapeutic exercise;Therapeutic activities    PT Goals (Current goals can be found in the Care Plan section)  Acute Rehab PT Goals Patient Stated Goal: to go home PT Goal Formulation: With patient Time For Goal Achievement: 05/26/18 Potential to Achieve Goals: Good    Frequency Min 3X/week   Barriers to discharge        Co-evaluation               AM-PAC PT "6 Clicks" Mobility  Outcome Measure Help needed turning from your back to your side while in a flat bed without using bedrails?: A Little Help needed moving from lying on your back to sitting on the side of a flat bed without using bedrails?: A Little Help needed moving to and from a bed to a chair (including a wheelchair)?: None Help needed standing up from a chair using your arms (e.g., wheelchair or bedside chair)?: None Help needed to walk in hospital room?: A Little Help needed climbing 3-5 steps with a railing? : A Lot 6 Click Score: 19    End of Session Equipment Utilized During Treatment: Gait belt Activity Tolerance: Patient tolerated treatment well Patient left: in chair;with call bell/phone within reach Nurse Communication: Mobility status PT Visit Diagnosis: Muscle weakness (generalized) (M62.81);Unsteadiness on feet (R26.81);Difficulty in walking, not elsewhere classified (R26.2)    Time: 2458-0998 PT Time Calculation (min) (ACUTE  ONLY): 17 min   Charges:   PT Evaluation $PT Eval Moderate Complexity: 1 Mod          Mylo Red, PT, DPT Acute Rehabilitation Services Pager [phone] Office [phone]      Blake Divine A Lanier Ensign 05/12/2018, 12:19 PM

## 2018-05-12 NOTE — Progress Notes (Signed)
ANTICOAGULATION CONSULT NOTE  Pharmacy Consult for Heparin Indication: chest pain/ACS  Allergies  Allergen Reactions  . Doxycycline Other (See Comments)    lethargy  . Hydrochlorothiazide Other (See Comments)    lethargic   . Latex Rash  . Penicillins Swelling and Rash    Pt states she has tolerated Keflex in the past without problems. States she may have tolerated Augmentin in the past but it caused GI upset. Has patient had a PCN reaction causing immediate rash, facial/tongue/throat swelling, SOB or lightheadedness with hypotension: Yes Has patient had a PCN reaction causing severe rash involving mucus membranes or skin necrosis: No Has patient had a PCN reaction that required hospitalization No Has patient had a PCN reaction occurring within the last 10 years: No    Patient Measurements: Height: 5\' 4"  (162.6 cm) Weight: 181 lb 8 oz (82.3 kg) IBW/kg (Calculated) : 54.7 Heparin Dosing Weight: 72kg  Vital Signs: Temp: 98.5 F (36.9 C) (04/13 2340) Temp Source: Oral (04/13 2340) BP: 145/55 (04/13 2340) Pulse Rate: 72 (04/13 2340)  Labs: Recent Labs    05/09/18 0450 05/09/18 1134 05/10/18 0231 05/10/18 1134 05/11/18 0247 05/11/18 2347  HGB 9.8*  --  9.2*  --  9.5* 8.9*  HCT 30.0*  --  26.8*  --  27.4* 26.0*  PLT 232  --  192  --  193 202  HEPARINUNFRC 0.33 0.31 0.18* 0.42 0.32 0.25*  CREATININE 2.07*  --  1.39*  --  1.00  --   TROPONINI 7.09* 3.72*  --   --   --   --     Estimated Creatinine Clearance: 55.8 mL/min (by C-G formula based on SCr of 1 mg/dL).  Assessment: 12 yoF admitted as NSTEMI and now s/p LHC revealing 3-vessel CAD. Pharmacy consulted to resume IV heparin in 8hr while awaiting CT surgery evaluation for CABG. Heparin has remained therapeutic with stable CBC on 1100 units/h.  4/14 AM update: heparin level sub-therapeutic after re-start s/p cath, no issues per RN, CVTS rec's medical therapy.   Goal of Therapy:  Heparin level 0.3-0.7  units/ml Monitor platelets by anticoagulation protocol: Yes   Plan:  -Inc heparin to 1200 units/hr -0900 heparin level/CBC  Abran Duke, PharmD, BCPS Clinical Pharmacist Phone: [phone]

## 2018-05-12 NOTE — Progress Notes (Signed)
Cardiac Rehab Advisory Cardiac Rehab Phase I is not seeing pts face to face at this time due to Covid 19 restrictions. Ambulation is occurring through nursing, PT, and mobility teams. We will help facilitate that process as needed. We are calling pts in their rooms and discussing education. We will then deliver education materials to pts RN for delivery to pt.   Spoke with pt on phone regarding education. Discussed MI, restrictions, HF management, daily wts, signs of fluid, diet restrictions (low sodium, carb counting), activity as tolerated (pt has limited mobility, encouraged walking house every hour), NTG, and CRPII. Pt voiced understanding and appreciative of information. Will refer to [place] CRPII however pts exercise is limited. Will deliver education materials to pts RN. Will include instructions for watching educational videos. 1200-1250 Ethelda Chick CES, ACSM 12:50 PM 05/12/2018

## 2018-05-12 NOTE — Progress Notes (Signed)
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  chest x-ray showing pulmonary edema.  -She was given 2 doses of IV Lasix and initiated on heparin/nitro drip after her troponins were noted to be elevated (troponin overnight peaked to 9).    Subjective: -Feels okay, breathing a little better, felt to be a poor candidate for CABG by T CTS   Assessment & Plan:   1.   Non-STEMI: Patient denies any chest pain but troponin peaked to 9.  Seen by cardiology and underwent left heart cath 4/13 which showed severe left main disease with severe stenosis of left circumflex . -She was seen by T CTS Dr. Laneta Simmers, felt to be a very poor candidate for CABG due to multiple serious comorbidities  -Continue aspirin, Plavix, statin, beta-blocker, Imdur  -Discontinue heparin drip -Discharge planning, physical therapy eval today  2.Acute hypoxic respiratory failure POA secondary to acute on chronic combined systolic/diastolic CHF:  -Diuresed on admission,  -Volume status has improved considerably repeat echo shows EF 45% with moderate pulmonary hypertension  -Continue beta-blocker, resume oral Lasix tomorrow   3.  AKI on chronic kidney disease stage II:  -Creatinine trended up to 2 in the setting of aggressive diuresis versus ATN -improving ,bmet in am  4.  Hyperlipidemia:  -resume statin  5.  Diabetes mellitus:  -CBG stable, resume Lantus  7.GERD: PPI   DVT prophylaxis: dc Heparin drip, ADD LOVENOX Code Status: Full code Family / Patient Communication: Discussed with patient Disposition Plan: HOME tomorrow      LOS: 4 days    Time spent: 35 minutes    Zannie Cove, MD [facility]   Objective: Vitals:   05/12/18 0600 05/12/18 0807 05/12/18 0900 05/12/18 1103  BP: (!) 141/56 (!) 161/72 (!) 171/69   Pulse: 65  81   Resp: 19  19   Temp:  98.1 F (36.7 C)  98.4 F (36.9 C)  TempSrc:  Oral  Oral  SpO2: 95%  97%   Weight:      Height:        Intake/Output Summary (Last 24 hours) at 05/12/2018 1136 Last data filed at 05/12/2018 6606 Gross per 24 hour  Intake 720 ml  Output 2 ml  Net 718 ml   Filed Weights   05/10/18 0400 05/11/18 0600 05/12/18 0350  Weight: 82.6 kg 82.3 kg 84.3 kg    Physical Examination:  Gen: Awake, Alert, Oriented X 3,no distress  HEENT: PERRLA, Neck supple, no JVD Lungs: fine basilar crackles CVS: RRR,No Gallops,Rubs or new Murmurs Abd: soft, Non tender, non distended, BS present Extremities: No edema Skin: no new rashes Psychiatry: Judgement and insight appear normal. Mood & affect appropriate.     Data Reviewed: I have personally reviewed following labs and imaging studies  CBC: Recent Labs  Lab 05/08/18 1121 05/09/18 0450 05/10/18 0231 05/11/18 0247 05/11/18 2347 05/12/18 0816  WBC 12.2* 9.3 8.5 9.6 9.3 9.5  NEUTROABS 10.6*  --   --   --   --   --   HGB 12.0 9.8* 9.2* 9.5* 8.9* 9.7*  HCT 35.3* 30.0* 26.8* 27.4* 26.0* 28.1*  MCV  82.5 85.0 84.8 84.6 85.2 85.4  PLT 261 232 192 193 202 208   Basic Metabolic Panel: Recent Labs  Lab 05/08/18 0720 05/08/18 1121 05/09/18 0450 05/10/18 0231 05/11/18 0247  NA 133*  --  137 139 138  K 5.3*  --  4.3 4.3 4.2  CL 100  --  103 106 108  CO2 18*  --  20* 23 21*  GLUCOSE 385*  --  127* 84 224*  BUN 29*  --  37* 29* 22  CREATININE 1.48* 1.30* 2.07* 1.39* 1.00  CALCIUM 9.3  --  8.4* 8.5* 8.4*   GFR: Estimated Creatinine Clearance: 56.5 mL/min (by C-G formula based on SCr of 1 mg/dL). Liver Function Tests: No results for input(s): AST, ALT, ALKPHOS, BILITOT, PROT, ALBUMIN in the  last 168 hours. No results for input(s): LIPASE, AMYLASE in the last 168 hours. No results for input(s): AMMONIA in the last 168 hours. Coagulation Profile: No results for input(s): INR, PROTIME in the last 168 hours. Cardiac Enzymes: Recent Labs  Lab 05/08/18 1121 05/08/18 1726 05/08/18 2232 05/09/18 0450 05/09/18 1134  TROPONINI 1.31* 9.92* 7.63* 7.09* 3.72*   BNP (last 3 results) No results for input(s): PROBNP in the last 8760 hours. HbA1C: No results for input(s): HGBA1C in the last 72 hours. CBG: Recent Labs  Lab 05/11/18 0935 05/11/18 1644 05/11/18 2055 05/12/18 0642 05/12/18 1103  GLUCAP 84 280* 248* 108* 153*   Lipid Profile: Recent Labs    05/10/18 0231  CHOL 211*  HDL 30*  LDLCALC 104*  TRIG 386*  CHOLHDL 7.0   Thyroid Function Tests: No results for input(s): TSH, T4TOTAL, FREET4, T3FREE, THYROIDAB in the last 72 hours. Anemia Panel: No results for input(s): VITAMINB12, FOLATE, FERRITIN, TIBC, IRON, RETICCTPCT in the last 72 hours. Sepsis Labs: No results for input(s): PROCALCITON, LATICACIDVEN in the last 168 hours.  Recent Results (from the past 240 hour(s))  MRSA PCR Screening     Status: None   Collection Time: 05/08/18 11:52 AM  Result Value Ref Range Status   MRSA by PCR NEGATIVE NEGATIVE Final    Comment:        The GeneXpert MRSA Assay (FDA approved for NASAL specimens only), is one component of a comprehensive MRSA colonization surveillance program. It is not intended to diagnose MRSA infection nor to guide or monitor treatment for MRSA infections. Performed at [facility], [address], [place], [place] [address]       Radiology Studies: No results found.      Scheduled Meds: . aspirin EC  81 mg Oral Daily  . atorvastatin  80 mg Oral q1800  . gabapentin  100 mg Oral TID  . insulin aspart  0-5 Units Subcutaneous QHS  . insulin aspart  0-9 Units Subcutaneous TID WC  . insulin glargine  35 Units Subcutaneous QHS   . isosorbide mononitrate  60 mg Oral Daily  . metoprolol succinate  75 mg Oral Daily  . pantoprazole  40 mg Oral Daily  . sodium chloride flush  3 mL Intravenous Q12H   Continuous Infusions: . sodium chloride 100 mL/hr at 05/11/18 1905  . sodium chloride    . heparin 1,200 Units/hr (05/12/18 0100)   05/12/2018, 11:36 AM

## 2018-05-12 NOTE — TOC Progression Note (Signed)
Transition of Care [facility]) - Progression Note    Patient Details  Name: [name] [name] MRN: [id number] Date of Birth: 1950-06-06  Transition of Care [facility]) CM/SW Contact  Eduard Roux, [place] Phone Number: 05/12/2018, 2:32 PM  Clinical Narrative:     CSW offered the patient listing of Medicare.gov HH agency listings, patient states : I do not need that". Patient states she was on the phone with her daughter and inquired with her  if [facility] services was needed - they both agreed "No" to [facility] services. CSW advised patient  the RNCM may contact her- CSW updated RNCM       Expected Discharge Plan and Services                                     Social Determinants of Health (SDOH) Interventions    Readmission Risk Interventions No flowsheet data found.

## 2018-05-12 NOTE — Progress Notes (Signed)
Progress Note  Patient Name: [name] [name] Date of Encounter: 05/12/2018  Primary Cardiologist: Nicki Guadalajara, MD   Subjective   The patient has had no recurrent chest pain or shortness of breath.  Inpatient Medications    Scheduled Meds:  aspirin EC  81 mg Oral Daily   atorvastatin  80 mg Oral q1800   gabapentin  100 mg Oral TID   insulin aspart  0-5 Units Subcutaneous QHS   insulin aspart  0-9 Units Subcutaneous TID WC   insulin glargine  35 Units Subcutaneous QHS   isosorbide mononitrate  60 mg Oral Daily   metoprolol succinate  75 mg Oral Daily   pantoprazole  40 mg Oral Daily   sodium chloride flush  3 mL Intravenous Q12H   Continuous Infusions:  sodium chloride 100 mL/hr at 05/11/18 1905   sodium chloride     heparin 1,200 Units/hr (05/12/18 0100)   PRN Meds: sodium chloride, acetaminophen, diazepam, hydrALAZINE, ondansetron (ZOFRAN) IV, oxyCODONE, sodium chloride flush   Vital Signs    Vitals:   05/12/18 0400 05/12/18 0500 05/12/18 0600 05/12/18 0807  BP: (!) 164/68 (!) 129/59 (!) 141/56 (!) 161/72  Pulse: 75 65 65   Resp: 19 18 19    Temp:    98.1 F (36.7 C)  TempSrc:    Oral  SpO2: 97% 95% 95%   Weight:      Height:        Intake/Output Summary (Last 24 hours) at 05/12/2018 0846 Last data filed at 05/12/2018 9150 Gross per 24 hour  Intake 720 ml  Output 2 ml  Net 718 ml   Last 3 Weights 05/12/2018 05/11/2018 05/10/2018  Weight (lbs) 185 lb 13.6 oz 181 lb 8 oz 182 lb 1.6 oz  Weight (kg) 84.3 kg 82.328 kg 82.6 kg      Telemetry    SR, no sig ectopy, No sustained arrhythmia - Personally Reviewed   Physical Exam   04/14 General: Well developed, well nourished, female in no acute distress Head: Eyes PERRLA, No xanthomas.   Normocephalic and atraumatic Lungs: Clear bilaterally to auscultation. Heart: HRRR S1 S2, without RG. 2/6 SEM at the right upper sternal border, pulses are 2+ & equal. No JVD.  The right radial catheterization site  is clear with no hematoma or ecchymosis.  The right groin catheterization site is clear with no hematoma or ecchymosis. Abdomen: Bowel sounds are present, abdomen soft and non-tender without masses or  hernias noted. Msk: Normal strength and tone for age. Extremities: No clubbing, cyanosis or edema. Skin:  No rashes or lesions noted. Neuro: Alert and oriented X 3. Psych:  Good affect, responds appropriately  Labs    Chemistry Recent Labs  Lab 05/09/18 0450 05/10/18 0231 05/11/18 0247  NA 137 139 138  K 4.3 4.3 4.2  CL 103 106 108  CO2 20* 23 21*  GLUCOSE 127* 84 224*  BUN 37* 29* 22  CREATININE 2.07* 1.39* 1.00  CALCIUM 8.4* 8.5* 8.4*  GFRNONAA 24* 39* 58*  GFRAA 28* 45* >60  ANIONGAP 14 10 9      Hematology Recent Labs  Lab 05/10/18 0231 05/11/18 0247 05/11/18 2347  WBC 8.5 9.6 9.3  RBC 3.16* 3.24* 3.05*  HGB 9.2* 9.5* 8.9*  HCT 26.8* 27.4* 26.0*  MCV 84.8 84.6 85.2  MCH 29.1 29.3 29.2  MCHC 34.3 34.7 34.2  RDW 13.9 13.7 14.0  PLT 192 193 202    Cardiac Enzymes Recent Labs  Lab 05/08/18 1726 05/08/18  2232 05/09/18 0450 05/09/18 1134  TROPONINI 9.92* 7.63* 7.09* 3.72*      BNP Recent Labs  Lab 05/08/18 0720  BNP 624.8*     Radiology    No results found.  Cardiac Studies   CARDIAC CATH: 05/11/2018  Ost Cx to Prox Cx lesion is 85% stenosed.  Mid LM to Dist LM lesion is 80% stenosed.  Mid Cx to Dist Cx lesion is 50% stenosed.  Mid Cx lesion is 40% stenosed.  Prox LAD to Mid LAD lesion is 30% stenosed.  LV end diastolic pressure is moderately elevated.  There is mild to moderate left ventricular systolic dysfunction.   Significant coronary calcification with eccentric 80% mid distal left main stenosis, mild diffuse irregularity of the LAD with 30% stenoses, and a large dominant left calcified left circumflex coronary artery with previously placed stent in the proximal circumflex with focal mid instent 85% stenosis, as well as segmental  40 and 50% mid AV groove stenoses.  The RCA is a small nondominant normal vessel.  Moderate LV dysfunction with an EF estimated at 40 to 45% with hypocontractility involving the mid anterolateral wall and basal mid inferior wall.  LVEDP elevated at 28 mmHg.  RECOMMENDATION: Surgical consultation for CABG revascularization. Diagnostic  Dominance: Left      2D echocardiogram 05/09/2018: IMPRESSIONS  1. The left ventricle had a visually estimated ejection fraction of of 45%. The cavity size was normal. Left ventricular diastolic Doppler parameters are consistent with pseudonormal. Elevated left ventricular end-diastolic pressure The E/e' is 16.1.  Left ventricular diffuse hypokinesis.  2. The right ventricle has normal systolc function. The cavity was normal. There is no increase in right ventricular wall thickness. Right ventricular systolic pressure could not be assessed.  3. The mitral valve is abnormal. Mild thickening of the mitral valve leaflet. There is moderate mitral annular calcification present.  4. The inferior vena cava was dilated in size with <50% respiratory variability.  5. When compared to the prior study: Side by side comparison of images performed to 02/13/2018 exam. No significant change in LV function or wall motion.  Patient Profile     68 y.o. female with known coronary artery disease and peripheral arterial disease presents with non-STEMI, troponin peak 9.92 mg/dL  Assessment & Plan    1.  Non-STEMI:  - s/p cath, results above - w/ L main dz and sig CFX dz>>CABG eval by Dr Laneta Simmers, but she is considered high-risk.  Medical therapy is recommended.  2.  Acute diastolic heart failure:  - she is up 5 lbs from admission, I/O +2.6 L - discuss 1-2 doses IV Lasix w/ MD - Last Lasix dose was 04/10  3.  Hypertension: - BP not well controlled currently, SBP 120s-160s, mostly > 140 - however, not on home dose Cozaar 100 mg or Lasix 40 mg bid - med changes per  MD  4.  Mixed hyperlipidemia:  - continue Lipitor 80 mg  5.  Expressive aphasia:  - mild and no reports of it worsening - continue to follow symptomatically  6. ARI - Cr on admit 2.07, recheck today  For questions or updates, please contact [facility] Please consult [email or web address] for contact info under     Signed, Theodore Demark, PA-C  05/12/2018, 8:46 AM    Patient seen, examined. Available data reviewed. Agree with findings, assessment, and plan as outlined by Theodore Demark, PA-C.  The physical exam findings documented above reflect my personal findings from today's examination.  I have  personally reviewed the patient's cardiac catheterization films.  I have discussed her case with both Dr. Tresa Endo and Dr. Laneta Simmers.  Dr. Laneta Simmers surgical consultation note is reviewed.  The patient is felt to be a poor candidate for CABG as he clearly outlined in his note.  She has a heavily diseased aorta, poor conduit with previous use of both saphenous veins for lower extremity bypass, and multiple other comorbidities.  I think PCI of the left main/left circumflex would, at very high risk because of heavy calcification of those vessels.  Would favor medical therapy as a first-line approach.  If she fails medical therapy with recurrent resting angina or non-STEMI, PCI could be considered.  Will adjust her antianginal program, continue dual antiplatelet therapy with aspirin and clopidogrel, and arrange close cardiology follow-up.  Her weight is up about 5 pounds from admission.  We will give her a single dose of IV Lasix this morning and resume her oral Lasix tomorrow.  We will mobilize her today and tentatively would anticipate hospital discharge tomorrow as long as she is stable.  Tonny Bollman, M.D. 05/12/2018 10:50 AM

## 2018-05-13 ENCOUNTER — Telehealth: Payer: Self-pay | Admitting: Cardiology

## 2018-05-13 LAB — CBC
HCT: 25.8 % — ABNORMAL LOW (ref 36.0–46.0)
Hemoglobin: 8.5 g/dL — ABNORMAL LOW (ref 12.0–15.0)
MCH: 28.2 pg (ref 26.0–34.0)
MCHC: 32.9 g/dL (ref 30.0–36.0)
MCV: 85.7 fL (ref 80.0–100.0)
Platelets: 212 10*3/uL (ref 150–400)
RBC: 3.01 MIL/uL — ABNORMAL LOW (ref 3.87–5.11)
RDW: 14 % (ref 11.5–15.5)
WBC: 8.8 10*3/uL (ref 4.0–10.5)
nRBC: 0 % (ref 0.0–0.2)

## 2018-05-13 LAB — BASIC METABOLIC PANEL
Anion gap: 8 (ref 5–15)
BUN: 15 mg/dL (ref 8–23)
CO2: 22 mmol/L (ref 22–32)
Calcium: 8.9 mg/dL (ref 8.9–10.3)
Chloride: 110 mmol/L (ref 98–111)
Creatinine, Ser: 1.07 mg/dL — ABNORMAL HIGH (ref 0.44–1.00)
GFR calc Af Amer: >60 mL/min (ref >60)
GFR calc non Af Amer: 53 mL/min — ABNORMAL LOW (ref >60)
Glucose, Bld: 123 mg/dL — ABNORMAL HIGH (ref 70–99)
Potassium: 4.3 mmol/L (ref 3.5–5.1)
Sodium: 140 mmol/L (ref 135–145)

## 2018-05-13 LAB — GLUCOSE, CAPILLARY
Glucose-Capillary: 96 mg/dL (ref 70–99)
Glucose-Capillary: 192 mg/dL — ABNORMAL HIGH (ref 70–99)

## 2018-05-13 MED ORDER — FUROSEMIDE 40 MG PO TABS: 40.0000 mg | ORAL_TABLET | Freq: Two times a day (BID) | ORAL | Status: DC

## 2018-05-13 MED ORDER — ISOSORBIDE MONONITRATE ER 60 MG PO TB24: 60.0000 mg | ORAL_TABLET | Freq: Every day | ORAL | Status: DC

## 2018-05-13 MED ORDER — POTASSIUM CHLORIDE CRYS ER 10 MEQ PO TBCR
10.0000 meq | EXTENDED_RELEASE_TABLET | Freq: Every day | ORAL | Status: DC
  Administered 2018-05-13: 12:00:00 10 meq via ORAL
  Filled 2018-05-13: qty 1

## 2018-05-13 MED ORDER — GABAPENTIN 300 MG PO CAPS: 300.0000 mg | ORAL_CAPSULE | Freq: Every day | ORAL | Status: DC

## 2018-05-13 MED ORDER — FUROSEMIDE 10 MG/ML IJ SOLN
40.0000 mg | Freq: Once | INTRAMUSCULAR | Status: AC
  Administered 2018-05-13: 12:00:00 40 mg via INTRAVENOUS
  Filled 2018-05-13: qty 4

## 2018-05-13 MED ORDER — CLOPIDOGREL BISULFATE 75 MG PO TABS
75.0000 mg | ORAL_TABLET | Freq: Every day | ORAL | Status: DC
  Administered 2018-05-13: 12:00:00 75 mg via ORAL
  Filled 2018-05-13: qty 1

## 2018-05-13 NOTE — Telephone Encounter (Signed)
   TELEPHONE CALL NOTE  This patient has been deemed a candidate for follow-up tele-health visit to limit community exposure during the Covid-19 pandemic. I spoke with the patient via phone to discuss instructions. This has been outlined on the patient's AVS (dotphrase: [email or web address]). The patient was advised to review the section on consent for treatment as well. The patient will receive a phone call 2-3 days prior to their E-Visit at which time consent will be verbally confirmed. A Virtual Office Visit appointment type has been scheduled for 05/26/18 at 3pm with Azalee Course PA, with "VIDEO" or "TELEPHONE" in the appointment notes - patient prefers TELEPHONE type as she does not have a smart phone.   Laverda Page, NP 05/13/2018 9:43 AM

## 2018-05-13 NOTE — Telephone Encounter (Signed)
New Message:   Please call patient concerning TOC.

## 2018-05-13 NOTE — Plan of Care (Signed)
  Problem: Education: Goal: Knowledge of General Education information will improve Description Including pain rating scale, medication(s)/side effects and non-pharmacologic comfort measures Outcome: Adequate for Discharge   Problem: Health Behavior/Discharge Planning: Goal: Ability to manage health-related needs will improve Outcome: Adequate for Discharge   Problem: Clinical Measurements: Goal: Ability to maintain clinical measurements within normal limits will improve Outcome: Adequate for Discharge Goal: Will remain free from infection Outcome: Adequate for Discharge Goal: Diagnostic test results will improve Outcome: Adequate for Discharge Goal: Respiratory complications will improve Outcome: Adequate for Discharge Goal: Cardiovascular complication will be avoided Outcome: Adequate for Discharge   Problem: Activity: Goal: Risk for activity intolerance will decrease Outcome: Adequate for Discharge   Problem: Nutrition: Goal: Adequate nutrition will be maintained Outcome: Adequate for Discharge   Problem: Coping: Goal: Level of anxiety will decrease Outcome: Adequate for Discharge   Problem: Elimination: Goal: Will not experience complications related to bowel motility Outcome: Adequate for Discharge Goal: Will not experience complications related to urinary retention Outcome: Adequate for Discharge   Problem: Pain Managment: Goal: General experience of comfort will improve Outcome: Adequate for Discharge   Problem: Safety: Goal: Ability to remain free from injury will improve Outcome: Adequate for Discharge   Problem: Skin Integrity: Goal: Risk for impaired skin integrity will decrease Outcome: Adequate for Discharge   DC instructions given to patient. VSS. Educated on new medication regimen, no new Rx given. Dosages changed from previous to admission. Questions answered. Pt request Rx for NTG SL to be sent to [facility] on [address]--RN paged Lillia Abed, NP  Cardiology. PIV DC, hemostasis achieved. All belongings sent home with patient at time of DC. Pt escorted by wheelchair via volunteers to private vehicle driven by daughter.

## 2018-05-13 NOTE — Progress Notes (Signed)
Cardiac Rehab Advisory Cardiac Rehab Phase I is not seeing pts face to face at this time due to Covid 19 restrictions. Ambulation is occurring through nursing, PT, and mobility teams. We will help facilitate that process as needed. We are calling pts in their rooms and discussing education. We will then deliver education materials to pts RN for delivery to pt.   Spoke with pt by phone and reviewed education. Pt able to st signs and sx of fluid retention, how to take NTG, diet precautions. I answered pts questions. Very receptive. [id number] Ethelda Chick CES, ACSM 9:21 AM 05/13/2018

## 2018-05-13 NOTE — Telephone Encounter (Signed)
PT IS STILL IN HOSPITAL WILL CALL AGAIN TOMORROW FOR TOC CALL

## 2018-05-13 NOTE — Progress Notes (Signed)
Physical Therapy Treatment Patient Details Name: [name] [name] MRN: [id number] DOB: [birth year] Today's Date: 05/13/2018    History of Present Illness Patient is a 68 y/o female who presents with dyspnea and LE swelling. CXR- pulmonary edema. Found to have NSTEMI. s/p left cardiac cath 4/13-severe left main disease with severe stenosis of left circumflex. poor candidate for CABG- being managed medically. 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 Pt required standing rest break x1 during ambulation and rest break x2 during LE exercises. PT administered LE strengthening exercise program for pt to perform at home, as pt refused HHPT at this time. Pt plans to d/c today.    Follow Up Recommendations  Supervision for mobility/OOB;Home health PT     Equipment Recommendations  None recommended by PT    Recommendations for Other Services       Precautions / Restrictions Precautions Precautions: Fall Restrictions Weight Bearing Restrictions: No    Mobility  Bed Mobility Overal bed mobility: Needs Assistance Bed Mobility: Supine to Sit     Supine to sit: Min assist     General bed mobility comments: Min assist for trunk elevation, pt with use of bed rails to pull up and scoot to EOB.   Transfers Overall transfer level: Needs assistance Equipment used: Rolling walker (2 wheeled) Transfers: Sit to/from Stand Sit to Stand: Min guard         General transfer comment: Min guard for safety. Verbal cuing to push up from bed.   Ambulation/Gait Ambulation/Gait assistance: Min guard Gait Distance (Feet): 125 Feet Assistive device: Rolling walker (2 wheeled) Gait Pattern/deviations: Step-through pattern;Decreased stride length;Trunk flexed;Wide base of support Gait velocity: decreased   General Gait  Details: Min guard for safety, verbal cuing for placement in RW and upright posture during ambulation. HR up to 117 bpm with ambulation, sats 96% and above on RA. Pt required one standing rest for dyspnea 2/4.    Stairs             Wheelchair Mobility    Modified Rankin (Stroke Patients Only)       Balance Overall balance assessment: Needs assistance Sitting-balance support: Feet supported Sitting balance-Leahy Scale: Fair     Standing balance support: During functional activity;Bilateral upper extremity supported Standing balance-Leahy Scale: Poor Standing balance comment: reliant on RW for UE support.                            Cognition Arousal/Alertness: Awake/alert Behavior During Therapy: WFL for tasks assessed/performed Overall Cognitive Status: Within Functional Limits for tasks assessed                                        Exercises General Exercises - Lower Extremity Long Arc Quad: AROM;Both;10 reps;Seated Hip ABduction/ADduction: AROM;AAROM;Both;10 reps;Seated Straight Leg Raises: AROM;Both;5 reps;Seated    General Comments        Pertinent Vitals/Pain Pain Assessment: No/denies pain    Home Living                      Prior Function            PT Goals (  current goals can now be found in the care plan section) Acute Rehab PT Goals Patient Stated Goal: to go home PT Goal Formulation: With patient Time For Goal Achievement: 05/26/18 Potential to Achieve Goals: Good Progress towards PT goals: Progressing toward goals    Frequency    Min 3X/week      PT Plan Current plan remains appropriate    Co-evaluation              AM-PAC PT "6 Clicks" Mobility   Outcome Measure  Help needed turning from your back to your side while in a flat bed without using bedrails?: A Little Help needed moving from lying on your back to sitting on the side of a flat bed without using bedrails?: A Little Help  needed moving to and from a bed to a chair (including a wheelchair)?: None Help needed standing up from a chair using your arms (e.g., wheelchair or bedside chair)?: None Help needed to walk in hospital room?: A Little Help needed climbing 3-5 steps with a railing? : A Lot 6 Click Score: 19    End of Session Equipment Utilized During Treatment: Gait belt Activity Tolerance: Patient tolerated treatment well Patient left: in chair;with call bell/phone within reach Nurse Communication: Mobility status PT Visit Diagnosis: Muscle weakness (generalized) (M62.81);Unsteadiness on feet (R26.81);Difficulty in walking, not elsewhere classified (R26.2)     Time: 5051-8335 PT Time Calculation (min) (ACUTE ONLY): 21 min  Charges:  $Gait Training: 8-22 mins                     Nicola Police, PT Acute Rehabilitation Services Pager [phone]  Office [phone]   Tyrone Apple D Despina Hidden 05/13/2018, 12:39 PM

## 2018-05-13 NOTE — Plan of Care (Signed)
  Problem: Education: Goal: Knowledge of General Education information will improve Description Including pain rating scale, medication(s)/side effects and non-pharmacologic comfort measures Outcome: Progressing   

## 2018-05-13 NOTE — Progress Notes (Signed)
Progress Note  Patient Name: [name] [name] Date of Encounter: 05/13/2018  Primary Cardiologist: Nicki Guadalajara, MD   Subjective   Feels well.  Eager to go home.  No chest pain or dyspnea.  Inpatient Medications    Scheduled Meds: . aspirin EC  81 mg Oral Daily  . atorvastatin  80 mg Oral q1800  . gabapentin  100 mg Oral TID  . insulin aspart  0-5 Units Subcutaneous QHS  . insulin aspart  0-9 Units Subcutaneous TID WC  . insulin glargine  35 Units Subcutaneous QHS  . isosorbide mononitrate  60 mg Oral Daily  . metoprolol succinate  75 mg Oral Daily  . pantoprazole  40 mg Oral Daily  . sodium chloride flush  3 mL Intravenous Q12H   Continuous Infusions: . sodium chloride 100 mL/hr at 05/11/18 1905  . sodium chloride     PRN Meds: sodium chloride, acetaminophen, diazepam, hydrALAZINE, ondansetron (ZOFRAN) IV, oxyCODONE, sodium chloride flush   Vital Signs    Vitals:   05/12/18 1103 05/12/18 1619 05/12/18 1938 05/13/18 0514  BP:   (!) 158/64 (!) 167/71  Pulse:   74 69  Resp:   19   Temp: 98.4 F (36.9 C) 98.5 F (36.9 C) 98.3 F (36.8 C) (!) 97.4 F (36.3 C)  TempSrc: Oral Oral Oral Oral  SpO2:   98% 96%  Weight:      Height:        Intake/Output Summary (Last 24 hours) at 05/13/2018 0725 Last data filed at 05/12/2018 1743 Gross per 24 hour  Intake 720 ml  Output -  Net 720 ml   Last 3 Weights 05/12/2018 05/11/2018 05/10/2018  Weight (lbs) 185 lb 13.6 oz 181 lb 8 oz 182 lb 1.6 oz  Weight (kg) 84.3 kg 82.328 kg 82.6 kg      Telemetry    Normal sinus rhythm without significant arrhythmia, heart rate in the low 80s - Personally Reviewed   Physical Exam  Alert, oriented woman in no distress GEN: No acute distress.   Neck:  Moderate JVD Cardiac: RRR, 2/6 systolic ejection murmur at the right upper sternal border Respiratory: Clear to auscultation bilaterally. GI: Soft, nontender, non-distended  MS: No edema; No deformity. Neuro:  Nonfocal  Psych: Normal  affect   Labs    Chemistry Recent Labs  Lab 05/10/18 0231 05/11/18 0247 05/13/18 0325  NA 139 138 140  K 4.3 4.2 4.3  CL 106 108 110  CO2 23 21* 22  GLUCOSE 84 224* 123*  BUN 29* 22 15  CREATININE 1.39* 1.00 1.07*  CALCIUM 8.5* 8.4* 8.9  GFRNONAA 39* 58* 53*  GFRAA 45* >60 >60  ANIONGAP 10 9 8      Hematology Recent Labs  Lab 05/11/18 2347 05/12/18 0816 05/13/18 0325  WBC 9.3 9.5 8.8  RBC 3.05* 3.29* 3.01*  HGB 8.9* 9.7* 8.5*  HCT 26.0* 28.1* 25.8*  MCV 85.2 85.4 85.7  MCH 29.2 29.5 28.2  MCHC 34.2 34.5 32.9  RDW 14.0 13.9 14.0  PLT 202 208 212    Cardiac Enzymes Recent Labs  Lab 05/08/18 1726 05/08/18 2232 05/09/18 0450 05/09/18 1134  TROPONINI 9.92* 7.63* 7.09* 3.72*   No results for input(s): TROPIPOC in the last 168 hours.   BNP Recent Labs  Lab 05/08/18 0720  BNP 624.8*     DDimer No results for input(s): DDIMER in the last 168 hours.   Radiology    No results found.  Cardiac Studies  CARDIAC CATH: 05/11/2018  Ost Cx to Prox Cx lesion is 85% stenosed.  Mid LM to Dist LM lesion is 80% stenosed.  Mid Cx to Dist Cx lesion is 50% stenosed.  Mid Cx lesion is 40% stenosed.  Prox LAD to Mid LAD lesion is 30% stenosed.  LV end diastolic pressure is moderately elevated.  There is mild to moderate left ventricular systolic dysfunction.  Significant coronary calcification with eccentric 80% mid distal left main stenosis, mild diffuse irregularity of the LAD with 30% stenoses, and a large dominant left calcified left circumflex coronary artery with previously placed stent in the proximal circumflex with focal mid instent 85% stenosis, as well as segmental 40 and 50% mid AV groove stenoses. The RCA is a small nondominant normal vessel.  Moderate LV dysfunction with an EF estimated at 40 to 45% with hypocontractility involving the mid anterolateral wall and basal mid inferior wall. LVEDP elevated at 28 mmHg.  RECOMMENDATION: Surgical  consultation for CABG revascularization. Diagnostic  Dominance: Left      2D echocardiogram 05/09/2018: IMPRESSIONS 1. The left ventricle had a visually estimated ejection fraction of of 45%. The cavity size was normal. Left ventricular diastolic Doppler parameters are consistent with pseudonormal. Elevated left ventricular end-diastolic pressure The E/e' is 81.128.5.  Left ventricular diffuse hypokinesis. 2. The right ventricle has normal systolc function. The cavity was normal. There is no increase in right ventricular wall thickness. Right ventricular systolic pressure could not be assessed. 3. The mitral valve is abnormal. Mild thickening of the mitral valve leaflet. There is moderate mitral annular calcification present. 4. The inferior vena cava was dilated in size with <50% respiratory variability. 5. When compared to the prior study: Side by side comparison of images performed to 02/13/2018 exam. No significant change in LV function or wall motion.   Patient Profile     68 y.o. female with known coronary artery disease and peripheral arterial disease presents with non-STEMI, troponin peak 9.9. TCTS consulted but felt not to be a candidate for surgery. Plan for medical therapy.  Assessment & Plan    1.  Non-STEMI: underwent cardiac cath noted above with significant CAD including L main dz and sig CFX dz>>CABG eval by Dr Laneta SimmersBartle, but she is considered high-risk and not a good surgical candidate. Also high risk for PCI given heavy calcifications, therefore will plan medical therapy. If fails then could consider PCI in the future. Ambulated with PT yesterday with recommendation for HHPT at discharge.  -- on ASA, plavix, statin, BB, and Imdur.   2.  Acute diastolic heart failure: Given dose of IV lasix yesterday. I&Os not documented and no weight today.  -- resume oral lasix today.  3.  Hypertension: blood pressures are not well controlled. Cr stable post cath, consider adding  back home ARB today.  4.  Mixed hyperlipidemia: LDL 104 -- continue Lipitor 80 mg  5.  Expressive aphasia:  - mildwith no report of progression of symptoms - continue to follow symptomatically  6. AKI: - Cr on admit 2.07>>1.07 today. Resolved.  7. DM: on metformin prior to admission. SSI while inpatient.  [facility] will sign off.   Medication Recommendations:  Med recommendations noted above Other recommendations (labs, testing, etc):  - Follow up as an outpatient:  Will arrange for tele-health visit   For questions or updates, please contact [facility] Please consult [email or web address] for contact info under   Signed, Laverda PageLindsay Roberts, NP  05/13/2018, 7:25 AM    Patient seen, examined.  Available data reviewed. Agree with findings, assessment, and plan as outlined by Laverda Page, NP.  The physical exam findings documented above reflect my personal examination of this patient today.  The patient has no anginal symptoms on her current medical regimen.  She does have some evidence of volume overload on examination and her weight is up significantly from admission.  Her LVEDP was elevated at cardiac catheterization and she was found to have pseudo-normal LV filling pattern on echo.  I think she would benefit from some diuresis but this can be continued on an outpatient basis.  I agree that she is stable for discharge this morning.  I plan to give her a single dose of IV Lasix to stimulate some diuresis this morning and then will resume her home dosing regimen of furosemide 40 mg twice daily tomorrow.  Since she had recent acute kidney injury, it might be best to keep her off of losartan until she has follow-up labs as an outpatient.  Her medical regimen is otherwise reviewed and includes dual antiplatelet therapy with aspirin and clopidogrel, high intensity statin drug, a beta-blocker, and long-acting nitrate.  Will arrange follow-up with Dr. Tresa Endo as an outpatient.  Tonny Bollman,  M.D. 05/13/2018 9:17 AM

## 2018-05-13 NOTE — Discharge Summary (Signed)
Physician Discharge Summary  [name] [name] ZHY:[id number] DOB: 1950/10/07 DOA: 05/08/2018  PCP: Georgann Housekeeper, MD  Admit date: 05/08/2018 Discharge date: 05/13/2018  Time spent: 35 minutes  Recommendations for Outpatient Follow-up:  1. Cardiology Dr.Kelly in 2 weeks 2. PCP in 1 week, please check Bmet in 1 week   Discharge Diagnoses:  Principal Problem:   Acute on chronic combined systolic and diastolic CHF (congestive heart failure) (HCC)   NSTEMI    Coronary artery disease involving native coronary artery of native heart    Diabetes mellitus type II, uncontrolled (HCC)   Hyperlipidemia with target LDL less than 70   Acute respiratory failure with hypoxia (HCC)   SEMI (subendocardial myocardial infarction) (HCC)   GERD (gastroesophageal reflux disease)   CKD (chronic kidney disease) stage 3, GFR 30-59 ml/min (HCC)   Discharge Condition: stable  Diet recommendation: Diabetic, heart healthy  Filed Weights   05/10/18 0400 05/11/18 0600 05/12/18 0350  Weight: 82.6 kg 82.3 kg 84.3 kg    History of present illness:  68 year old female with history of hypertension type 2 diabetes mellitus, chronic kidney disease stage III combined diastolic and systolic CHF with EF of 45% presented to the ED with dyspnea and edema.  Hospital Course:   1.   Non-STEMI:  -admitted with pulmonary edema and non-ST elevation MI, troponin peaked to 9, seen by cardiology, underwent left heart cath on 4/13 showed severe left main disease with severe stenosis of left circumflex. -Was seen by CVTS Dr. Laneta Simmers, felt to be a very poor candidate for CABG due to numerous comorbidities, medical management was recommended. -At this time she will continue aspirin, Plavix, beta-blocker, statin, Imdur -She was also on heparin drip which was discontinued yesterday -Patient has been educated that she is at risk of decompensation, flash pulmonary edema hence needs to adhere to medication regimen, diet and diuretics  strictly. -FU with Cardiology Dr.Kelly in 2 weeks  2.Acute hypoxic respiratory failure POA secondary to acute on chronic combined systolic/diastolic CHF:  -Diuresed on admission,  -Volume status has improved considerably repeat echo shows EF 45% with moderate pulmonary hypertension  -Continue beta-blocker, resumed oral Lasix 40 mg twice daily  3.  AKI on chronic kidney disease stage II:  -Creatinine trended up to 2 in the setting of aggressive diuresis versus ATN -improving , creatinine has normalized -recheck bmet in 1 week  4.  Hyperlipidemia:  -resumed statin  5.  Diabetes mellitus:  -CBG stable, resumed Lantus  7.GERD: PPI  Procedures:  Left heart cath:  Ost Cx to Prox Cx lesion is 85% stenosed.  Mid LM to Dist LM lesion is 80% stenosed.  Mid Cx to Dist Cx lesion is 50% stenosed.  Mid Cx lesion is 40% stenosed.  Prox LAD to Mid LAD lesion is 30% stenosed.  LV end diastolic pressure is moderately elevated.  There is mild to moderate left ventricular systolic dysfunction.   Significant coronary calcification with eccentric 80% mid distal left main stenosis, mild diffuse irregularity of the LAD with 30% stenoses, and a large dominant left calcified left circumflex coronary artery with previously placed stent in the proximal circumflex with focal mid instent 85% stenosis, as well as segmental 40 and 50% mid AV groove stenoses.  The RCA is a small nondominant normal vessel.  Moderate LV dysfunction with an EF estimated at 40 to 45% with hypocontractility involving the mid anterolateral wall and basal mid inferior wall.  LVEDP elevated at 28 mmHg.  RECOMMENDATION: Surgical consultation for CABG revascularization.  Consultations:  Cardiology  CVTS   Discharge Exam: Vitals:   05/13/18 0736 05/13/18 1127  BP: (!) 172/77 (!) 177/77  Pulse: 86 84  Resp: (!) 21 19  Temp: 98.1 F (36.7 C) 98.2 F (36.8 C)  SpO2: 99% 96%    General: AAOx3 Cardiovascular:  S1S2/RRR Respiratory: CTAB  Discharge Instructions   Discharge Instructions    Amb Referral to Cardiac Rehabilitation   Complete by:  As directed    Diagnosis:  NSTEMI   Discharge instructions   Complete by:  As directed    Diabetic, low sodium heart healthy   Increase activity slowly   Complete by:  As directed      Allergies as of 05/13/2018      Reactions   Doxycycline Other (See Comments)   lethargy   Hydrochlorothiazide Other (See Comments)   lethargic    Latex Rash   Penicillins Swelling, Rash   Pt states she has tolerated Keflex in the past without problems. States she may have tolerated Augmentin in the past but it caused GI upset. Has patient had a PCN reaction causing immediate rash, facial/tongue/throat swelling, SOB or lightheadedness with hypotension: Yes Has patient had a PCN reaction causing severe rash involving mucus membranes or skin necrosis: No Has patient had a PCN reaction that required hospitalization No Has patient had a PCN reaction occurring within the last 10 years: No      Medication List    STOP taking these medications   losartan 100 MG tablet Commonly known as:  COZAAR     TAKE these medications   acetaminophen 325 MG tablet Commonly known as:  TYLENOL Take 650 mg by mouth every 6 (six) hours as needed for mild pain, fever or headache.   aspirin EC 81 MG tablet Take 81 mg by mouth daily.   atorvastatin 80 MG tablet Commonly known as:  LIPITOR Take 80 mg by mouth. for hyperlipidemia   clopidogrel 75 MG tablet Commonly known as:  PLAVIX Take 75 mg by mouth. for PVD   furosemide 40 MG tablet Commonly known as:  LASIX Take 1 tablet (40 mg total) by mouth 2 (two) times daily.   gabapentin 300 MG capsule Commonly known as:  NEURONTIN Take 1 capsule (300 mg total) by mouth at bedtime. What changed:  when to take this   isosorbide mononitrate 60 MG 24 hr tablet Commonly known as:  IMDUR Take 1 tablet (60 mg total) by mouth daily.  (Take along with 30 mg to equal 90 mg daily) What changed:    how much to take  Another medication with the same name was removed. Continue taking this medication, and follow the directions you see here.   Levemir FlexTouch 100 UNIT/ML Pen Generic drug:  Insulin Detemir Inject 35 Units into the skin at bedtime.   metFORMIN 1000 MG tablet Commonly known as:  GLUCOPHAGE Take 1,000 mg by mouth 2 (two) times daily with a meal.   metoprolol succinate 50 MG 24 hr tablet Commonly known as:  TOPROL-XL TAKE 1 AND 1/2 TABLETS BY MOUTH DAILY What changed:    how much to take  how to take this  when to take this  additional instructions   pantoprazole 40 MG tablet Commonly known as:  PROTONIX TAKE 1 TABLET BY MOUTH DAILY   potassium chloride 10 MEQ tablet Commonly known as:  K-DUR,KLOR-CON Take 10 mEq by mouth daily.   Ventolin HFA 108 (90 Base) MCG/ACT inhaler Generic drug:  albuterol Inhale 1  puff into the lungs every 4 (four) hours as needed for shortness of breath.      Allergies  Allergen Reactions  . Doxycycline Other (See Comments)    lethargy  . Hydrochlorothiazide Other (See Comments)    lethargic   . Latex Rash  . Penicillins Swelling and Rash    Pt states she has tolerated Keflex in the past without problems. States she may have tolerated Augmentin in the past but it caused GI upset. Has patient had a PCN reaction causing immediate rash, facial/tongue/throat swelling, SOB or lightheadedness with hypotension: Yes Has patient had a PCN reaction causing severe rash involving mucus membranes or skin necrosis: No Has patient had a PCN reaction that required hospitalization No Has patient had a PCN reaction occurring within the last 10 years: No   Follow-up Information    Georgann Housekeeper, MD. Schedule an appointment as soon as possible for a visit in 1 week(s).   Specialty:  Internal Medicine Contact information: [address], [address] [place]  [address] [phone]        Azalee Course, [place] Follow up on 05/26/2018.   Specialties:  Cardiology, Radiology Why:  at 3pm for your follow up appt. Will will a telephone visit.  Contact information: [address] [place] [address] [phone]            The results of significant diagnostics from this hospitalization (including imaging, microbiology, ancillary and laboratory) are listed below for reference.    Significant Diagnostic Studies: Dg Chest Portable 1 View  Result Date: 05/08/2018 CLINICAL DATA:  Shortness of breath. Nausea. Swelling of the lower extremities. EXAM: PORTABLE CHEST 1 VIEW COMPARISON:  02/19/2018 FINDINGS: Artifact overlies the chest. Chronic cardiomegaly and aortic atherosclerosis. Pulmonary venous hypertension with interstitial edema. No visible effusion. IMPRESSION: Congestive heart failure with cardiomegaly and interstitial edema. Electronically Signed   By: Paulina Fusi M.D.   On: 05/08/2018 08:11    Microbiology: Recent Results (from the past 240 hour(s))  MRSA PCR Screening     Status: None   Collection Time: 05/08/18 11:52 AM  Result Value Ref Range Status   MRSA by PCR NEGATIVE NEGATIVE Final    Comment:        The GeneXpert MRSA Assay (FDA approved for NASAL specimens only), is one component of a comprehensive MRSA colonization surveillance program. It is not intended to diagnose MRSA infection nor to guide or monitor treatment for MRSA infections. Performed at [facility], [address] [place], [place] [address]      Labs: Basic Metabolic Panel: Recent Labs  Lab 05/08/18 0720 05/08/18 1121 05/09/18 0450 05/10/18 0231 05/11/18 0247 05/13/18 0325  NA 133*  --  137 139 138 140  K 5.3*  --  4.3 4.3 4.2 4.3  CL 100  --  103 106 108 110  CO2 18*  --  20* 23 21* 22  GLUCOSE 385*  --  127* 84 224* 123*  BUN 29*  --  37* 29* 22 15  CREATININE 1.48* 1.30* 2.07* 1.39* 1.00 1.07*  CALCIUM 9.3  --  8.4* 8.5*  8.4* 8.9   Liver Function Tests: No results for input(s): AST, ALT, ALKPHOS, BILITOT, PROT, ALBUMIN in the last 168 hours. No results for input(s): LIPASE, AMYLASE in the last 168 hours. No results for input(s): AMMONIA in the last 168 hours. CBC: Recent Labs  Lab 05/08/18 1121  05/10/18 0231 05/11/18 0247 05/11/18 2347 05/12/18 1314 05/13/18 0325  WBC 12.2*   < > 8.5 9.6 9.3 9.5 8.8  NEUTROABS 10.6*  --   --   --   --   --   --   HGB 12.0   < > 9.2* 9.5* 8.9* 9.7* 8.5*  HCT 35.3*   < > 26.8* 27.4* 26.0* 28.1* 25.8*  MCV 82.5   < > 84.8 84.6 85.2 85.4 85.7  PLT 261   < > 192 193 202 208 212   < > = values in this interval not displayed.   Cardiac Enzymes: Recent Labs  Lab 05/08/18 1121 05/08/18 1726 05/08/18 2232 05/09/18 0450 05/09/18 1134  TROPONINI 1.31* 9.92* 7.63* 7.09* 3.72*   BNP: BNP (last 3 results) Recent Labs    02/13/18 0152 05/08/18 0720  BNP 411.9* 624.8*    ProBNP (last 3 results) No results for input(s): PROBNP in the last 8760 hours.  CBG: Recent Labs  Lab 05/12/18 1103 05/12/18 1622 05/12/18 2107 05/13/18 0625 05/13/18 1129  GLUCAP 153* 152* 202* 96 192*       Signed:  Zannie Cove MD.  [facility] 05/13/2018, 1:28 PM

## 2018-05-14 NOTE — Telephone Encounter (Signed)
Patient contacted regarding discharge from [facility] on 05/13/2018.  Patient understands to follow up with provider Azalee Course, PA on 05/26/2018 at 3:00 PM at [facility] (Virtual visit). Patient understands discharge instructions? Yes Patient understands medications and regiment? Yes Patient understands to bring all medications to this visit? Yes   Patient states she was told she would have RX sent in for the Nitroglycerin. Please advise, as I do not see in note- and she would like some. Okay to send in? I will route to PA who will be seeing the patient in a few weeks.  Thank you!

## 2018-05-14 NOTE — Telephone Encounter (Addendum)
Virtual Visit Pre-Appointment Phone Call  Steps For Call:  1. Confirm consent - "In the setting of the current Covid19 crisis, you are scheduled for a TELEPHONE visit with [name] MENG, PA-C on 05/26/2018 at 3:00PM.  Just as we do with many in-office visits, in order for you to participate in this visit, we must obtain consent.  If you'd like, I can send this to your mychart (if signed up) or email for you to review.  Otherwise, I can obtain your verbal consent now.  All virtual visits are billed to your insurance company just like a normal visit would be.  By agreeing to a virtual visit, we'd like you to understand that the technology does not allow for your provider to perform an examination, and thus may limit your provider's ability to fully assess your condition.  Finally, though the technology is pretty good, we cannot assure that it will always work on either your or our end, and in the setting of a video visit, we may have to convert it to a phone-only visit.  In either situation, we cannot ensure that we have a secure connection.  Are you willing to proceed?" STAFF: Did the patient verbally acknowledge consent to telehealth visit? Document YES/NO here: YES  2. Confirm the BEST phone number to call the day of the visit by including in appointment notes  3. Give patient instructions for WebEx/MyChart download to smartphone as below or Doximity/Doxy.me if video visit (depending on what platform provider is using)  4. Advise patient to be prepared with their blood pressure, heart rate, weight, any heart rhythm information, their current medicines, and a piece of paper and pen handy for any instructions they may receive the day of their visit  5. Inform patient they will receive a phone call 15 minutes prior to their appointment time (may be from unknown caller ID) so they should be prepared to answer  6. Confirm that appointment type is correct in Epic appointment notes (VIDEO vs PHONE)      TELEPHONE CALL NOTE  [name] [name] has been deemed a candidate for a follow-up tele-health visit to limit community exposure during the Covid-19 pandemic. I spoke with the patient via phone to ensure availability of phone/video source, confirm preferred email & phone number, and discuss instructions and expectations.  I reminded [name] [name] to be prepared with any vital sign and/or heart rhythm information that could potentially be obtained via home monitoring, at the time of her visit. I reminded [name] [name] to expect a phone call at the time of her visit if her visit.  [name] [name], CMA 05/14/2018 1:27 PM   INSTRUCTIONS FOR DOWNLOADING THE WEBEX APP TO SMARTPHONE  - If Apple, ask patient to go to [employer] and type in WebEx in the search bar. Download [employer], the blue/green circle. If Android, go to [employer] and type in [employer] in the search bar. The app is free but as with any other app downloads, their phone may require them to verify saved payment information or Apple/Android password.  - The patient does NOT have to create an account. - On the day of the visit, the assist will walk the patient through joining the meeting with the meeting number/password.  INSTRUCTIONS FOR DOWNLOADING THE MYCHART APP TO SMARTPHONE  - The patient must first make sure to have activated MyChart and know their login information - If Apple, go to [employer] and type in [employer]  in the search bar and download the app. If Android, ask patient to go to [employer] and type in [employer] in the search bar and download the app. The app is free but as with any other app downloads, their phone may require them to verify saved payment information or Apple/Android password.  - The patient will need to then log into the app with their MyChart username and password, and select [facility] as their healthcare provider to link the account. When it is time for your visit, go to the MyChart app, find  appointments, and click Begin Video Visit. Be sure to Select Allow for your device to access the Microphone and Camera for your visit. You will then be connected, and your provider will be with you shortly.  **If they have any issues connecting, or need assistance please contact MyChart service desk [phone])**  **If using a computer, in order to ensure the best quality for their visit they will need to use either of the following Internet Browsers: [employer], or Google Chrome**  IF USING DOXIMITY or DOXY.ME - The patient will receive a link just prior to their visit, either by text or email (to be determined day of appointment depending on if it's doxy.me or Doximity).     FULL LENGTH CONSENT FOR TELE-HEALTH VISIT   I hereby voluntarily request, consent and authorize [facility] and its employed or contracted physicians, physician assistants, nurse practitioners or other licensed health care professionals (the Practitioner), to provide me with telemedicine health care services (the "Services") as deemed necessary by the treating Practitioner. I acknowledge and consent to receive the Services by the Practitioner via telemedicine. I understand that the telemedicine visit will involve communicating with the Practitioner through live audiovisual communication technology and the disclosure of certain medical information by electronic transmission. I acknowledge that I have been given the opportunity to request an in-person assessment or other available alternative prior to the telemedicine visit and am voluntarily participating in the telemedicine visit.  I understand that I have the right to withhold or withdraw my consent to the use of telemedicine in the course of my care at any time, without affecting my right to future care or treatment, and that the Practitioner or I may terminate the telemedicine visit at any time. I understand that I have the right to inspect all  information obtained and/or recorded in the course of the telemedicine visit and may receive copies of available information for a reasonable fee.  I understand that some of the potential risks of receiving the Services via telemedicine include:  Marland Kitchen Delay or interruption in medical evaluation due to technological equipment failure or disruption; . Information transmitted may not be sufficient (e.g. poor resolution of images) to allow for appropriate medical decision making by the Practitioner; and/or  . In rare instances, security protocols could fail, causing a breach of personal health information.  Furthermore, I acknowledge that it is my responsibility to provide information about my medical history, conditions and care that is complete and accurate to the best of my ability. I acknowledge that Practitioner's advice, recommendations, and/or decision may be based on factors not within their control, such as incomplete or inaccurate data provided by me or distortions of diagnostic images or specimens that may result from electronic transmissions. I understand that the practice of medicine is not an exact science and that Practitioner makes no warranties or guarantees regarding treatment outcomes. I acknowledge that I will receive a copy of  this consent concurrently upon execution via email to the email address I last provided but may also request a printed copy by calling the office of [facility].    I understand that my insurance will be billed for this visit.   I have read or had this consent read to me. . I understand the contents of this consent, which adequately explains the benefits and risks of the Services being provided via telemedicine.  . I have been provided ample opportunity to ask questions regarding this consent and the Services and have had my questions answered to my satisfaction. . I give my informed consent for the services to be provided through the use of telemedicine in my  medical care  By participating in this telemedicine visit I agree to the above.

## 2018-05-15 DIAGNOSIS — I2510 Atherosclerotic heart disease of native coronary artery without angina pectoris: Secondary | ICD-10-CM | POA: Diagnosis not present

## 2018-05-15 DIAGNOSIS — E782 Mixed hyperlipidemia: Secondary | ICD-10-CM | POA: Diagnosis not present

## 2018-05-15 DIAGNOSIS — J449 Chronic obstructive pulmonary disease, unspecified: Secondary | ICD-10-CM | POA: Diagnosis not present

## 2018-05-15 DIAGNOSIS — E1140 Type 2 diabetes mellitus with diabetic neuropathy, unspecified: Secondary | ICD-10-CM | POA: Diagnosis not present

## 2018-05-15 DIAGNOSIS — I10 Essential (primary) hypertension: Secondary | ICD-10-CM | POA: Diagnosis not present

## 2018-05-15 DIAGNOSIS — N183 Chronic kidney disease, stage 3 (moderate): Secondary | ICD-10-CM | POA: Diagnosis not present

## 2018-05-15 DIAGNOSIS — E1159 Type 2 diabetes mellitus with other circulatory complications: Secondary | ICD-10-CM | POA: Diagnosis not present

## 2018-05-15 DIAGNOSIS — Z794 Long term (current) use of insulin: Secondary | ICD-10-CM | POA: Diagnosis not present

## 2018-05-15 DIAGNOSIS — I509 Heart failure, unspecified: Secondary | ICD-10-CM | POA: Diagnosis not present

## 2018-05-15 DIAGNOSIS — I252 Old myocardial infarction: Secondary | ICD-10-CM | POA: Diagnosis not present

## 2018-05-20 DIAGNOSIS — I700 Atherosclerosis of aorta: Secondary | ICD-10-CM | POA: Diagnosis not present

## 2018-05-20 DIAGNOSIS — N183 Chronic kidney disease, stage 3 (moderate): Secondary | ICD-10-CM | POA: Diagnosis not present

## 2018-05-20 DIAGNOSIS — N179 Acute kidney failure, unspecified: Secondary | ICD-10-CM | POA: Diagnosis not present

## 2018-05-20 DIAGNOSIS — I252 Old myocardial infarction: Secondary | ICD-10-CM | POA: Diagnosis not present

## 2018-05-20 DIAGNOSIS — I509 Heart failure, unspecified: Secondary | ICD-10-CM | POA: Diagnosis not present

## 2018-05-20 DIAGNOSIS — I10 Essential (primary) hypertension: Secondary | ICD-10-CM | POA: Diagnosis not present

## 2018-05-20 DIAGNOSIS — I2510 Atherosclerotic heart disease of native coronary artery without angina pectoris: Secondary | ICD-10-CM | POA: Diagnosis not present

## 2018-05-22 ENCOUNTER — Other Ambulatory Visit: Payer: Self-pay | Admitting: Cardiovascular Disease

## 2018-05-25 ENCOUNTER — Other Ambulatory Visit: Payer: Self-pay | Admitting: Cardiovascular Disease

## 2018-05-25 ENCOUNTER — Telehealth: Payer: Self-pay | Admitting: Physician Assistant

## 2018-05-25 NOTE — Telephone Encounter (Signed)
Mychart pending, no smartphone, pre reg complete 05/25/18 AF

## 2018-05-25 NOTE — Telephone Encounter (Signed)
Pantoprazole refilled. 

## 2018-05-26 ENCOUNTER — Telehealth (INDEPENDENT_AMBULATORY_CARE_PROVIDER_SITE_OTHER): Payer: Medicare Other | Admitting: Physician Assistant

## 2018-05-26 ENCOUNTER — Other Ambulatory Visit: Payer: Self-pay

## 2018-05-26 ENCOUNTER — Encounter: Payer: Self-pay | Admitting: Physician Assistant

## 2018-05-26 DIAGNOSIS — N183 Chronic kidney disease, stage 3 (moderate): Secondary | ICD-10-CM | POA: Diagnosis not present

## 2018-05-26 DIAGNOSIS — N179 Acute kidney failure, unspecified: Secondary | ICD-10-CM | POA: Diagnosis not present

## 2018-05-26 DIAGNOSIS — I5042 Chronic combined systolic (congestive) and diastolic (congestive) heart failure: Secondary | ICD-10-CM

## 2018-05-26 DIAGNOSIS — I739 Peripheral vascular disease, unspecified: Secondary | ICD-10-CM

## 2018-05-26 DIAGNOSIS — I2510 Atherosclerotic heart disease of native coronary artery without angina pectoris: Secondary | ICD-10-CM | POA: Diagnosis not present

## 2018-05-26 DIAGNOSIS — I10 Essential (primary) hypertension: Secondary | ICD-10-CM

## 2018-05-26 DIAGNOSIS — Z8673 Personal history of transient ischemic attack (TIA), and cerebral infarction without residual deficits: Secondary | ICD-10-CM

## 2018-05-26 DIAGNOSIS — E785 Hyperlipidemia, unspecified: Secondary | ICD-10-CM

## 2018-05-26 DIAGNOSIS — E119 Type 2 diabetes mellitus without complications: Secondary | ICD-10-CM

## 2018-05-26 NOTE — Patient Instructions (Signed)
Medication Instructions:   Your physician recommends that you continue on your current medications as directed. Please refer to the Current Medication list given to you today.  If you need a refill on your cardiac medications before your next appointment, please call your pharmacy.   Lab work:  NONE ordered at this time of appointment   If you have labs (blood work) drawn today and your tests are completely normal, you will receive your results only by: Marland Kitchen MyChart Message (if you have MyChart) OR . A paper copy in the mail If you have any lab test that is abnormal or we need to change your treatment, we will call you to review the results.  Testing/Procedures:  NONE ordered at this time of appointment   Follow-Up: At [facility], you and your health needs are our priority.  As part of our continuing mission to provide you with exceptional heart care, we have created designated Provider Care Teams.  These Care Teams include your primary Cardiologist (physician) and Advanced Practice Providers (APPs -  Physician Assistants and Nurse Practitioners) who all work together to provide you with the care you need, when you need it. You will need a follow up appointment in 3-4 months.  Please call our office 2 months in advance to schedule this appointment.  You may see Nicki Guadalajara, MD or one of the following Advanced Practice Providers on your designated Care Team: [place], [place] . Micah Flesher, PA-C  Any Other Special Instructions Will Be Listed Below (If Applicable).   Avoid salt  Limit fluid intake to 32-64 oz  Weigh daily, call our office if your weight increase 3 lbs overnight and 5 lbs in a week   Heart Failure Action Plan A heart failure action plan helps you understand what to do when you have symptoms of heart failure. Follow the plan that was created by you and your health care provider. Review your plan each time you visit your health care provider. Red zone These signs and  symptoms mean you should get medical help right away:  You have trouble breathing when resting.  You have a dry cough that is getting worse.  You have swelling or pain in your legs or abdomen that is getting worse.  You suddenly gain more than 2-3 lb (0.9-1.4 kg) in a day, or more than 5 lb (2.3 kg) in one week. This amount may be more or less depending on your condition.  You have trouble staying awake or you feel confused.  You have chest pain.  You do not have an appetite.  You pass out. If you experience any of these symptoms:  Call your local emergency services (911 in the [place] right away or seek help at the emergency department of the nearest hospital. Yellow zone These signs and symptoms mean your condition may be getting worse and you should make some changes:  You have trouble breathing when you are active or you need to sleep with extra pillows.  You have swelling in your legs or abdomen.  You gain 2-3 lb (0.9-1.4 kg) in one day, or 5 lb (2.3 kg) in one week. This amount may be more or less depending on your condition.  You get tired easily.  You have trouble sleeping.  You have a dry cough. If you experience any of these symptoms:  Contact your health care provider within the next day.  Your health care provider may adjust your medicines. Green zone These signs mean you are  doing well and can continue what you are doing:  You do not have shortness of breath.  You have very little swelling or no new swelling.  Your weight is stable (no gain or loss).  You have a normal activity level.  You do not have chest pain or any other new symptoms. Follow these instructions at home:  Take over-the-counter and prescription medicines only as told by your health care provider.  Weigh yourself daily. Your target weight is __________ lb (__________ kg). ? Call your health care provider if you gain more than _____3lbs_____ lb (__________ kg) in a day, or more than  _____5lbs_____ lb (__________ kg) in one week.  Eat a heart-healthy diet. Work with a diet and nutrition specialist (dietitian) to create an eating plan that is best for you.  Keep all follow-up visits as told by your health care provider. This is important. Where to find more information  American Heart Association: www.heart.org Summary  Follow the action plan that was created by you and your health care provider.  Get help right away if you have any symptoms in the Red zone. This information is not intended to replace advice given to you by your health care provider. Make sure you discuss any questions you have with your health care provider. Document Released: 02/24/2016 Document Revised: 09/18/2016 Document Reviewed: 02/24/2016 Elsevier Interactive Patient Education  2019 [employer].

## 2018-05-26 NOTE — Progress Notes (Signed)
Virtual Visit via Telephone Note   This visit type was conducted due to national recommendations for restrictions regarding the COVID-19 Pandemic (e.g. social distancing) in an effort to limit this patient's exposure and mitigate transmission in our community.  Due to her co-morbid illnesses, this patient is at least at moderate risk for complications without adequate follow up.  This format is felt to be most appropriate for this patient at this time.  The patient did not have access to video technology/had technical difficulties with video requiring transitioning to audio format only (telephone).  All issues noted in this document were discussed and addressed.  No physical exam could be performed with this format.  Please refer to the patient's chart for her  consent to telehealth for [facility].   Evaluation Performed:  Follow-up visit  Date:  05/28/2018   ID:  [name] [name], [name] [name] 12-May-1950, MRN [id number]  Patient Location: Home Provider Location: Home  PCP:  Georgann Housekeeper, MD  Cardiologist:  Nicki Guadalajara, MD  Electrophysiologist:  None   Chief Complaint:  followup  History of Present Illness:    [name] [name] is a 68 y.o. female with PMH of CAD, PVD, combined systolic and diastolic heart failure, DM II, HTN, HLD, CKD and prior stroke.  In 2011, she suffered a STEMI and underwent intervention totally occluded left circumflex artery with DES.  There was diffuse disease in her left circumflex artery beyond the site of total occlusion, she later underwent PTCA of the smaller vessels.  She had angioplasty and stenting with diamondback rotational arthrectomy of right popliteal and SFA in January 2012 and required repeat intervention in 2013.  She was hospitalized in October 2017 and underwent left femoral to below-knee popliteal artery bypass.  She had documented carotid stenosis with total right ICA occlusion.  Previous echocardiogram in January 2020 showed reduced EF of 45%, diffuse  hypokinesis, grade 2 DD.  Patient was admitted in early April 2020 with shortness of breath that woke her up from sleep.  She also had a right arm pain which was her anginal equivalent. Troponin increased to greater than 9. She also underwent IV diuresis as well given 2+ bilateral lower extremity edema.  Echocardiogram obtained on 05/09/2018 showed EF 45%, moderate mitral annular calcification without significant disease, no significant change when compared to the previous echocardiogram. Cardiac catheterization obtained on 05/11/2018 revealed 85% ostial left circumflex disease, 80% mid left main disease, 50% mid left circumflex disease, 30% proximal LAD disease.  RCA was normal however is a small nondominant vessel.  Cardiothoracic surgery was consulted due to two-vessel disease. She was evaluated by Dr. Laneta Simmers of cardiothoracic surgery however she is considered high risk.  Medical therapy was recommended.  Although patient initially had acute kidney injury with creatinine up to 2.07, however prior to discharge, creatinine improved to 1.07.  Her discharge weight was 185 pounds.  Patient was contacted today via [facility] telehealth visit.  Her weight this morning was 181 pounds which seems to be quite stable after discharge.  She just had lab work this morning at her PCPs office to check renal function.  Overall she is feeling quite well after the recent discharge.  She denies any significant chest discomfort or arm pain.  She is doing light activity at this time.  Otherwise she denies any lower extremity edema, orthopnea or PND.  The patient does not have symptoms concerning for COVID-19 infection (fever, chills, cough, or new shortness of breath).    Past Medical  History:  Diagnosis Date   Anemia 10/2015   Acute Blood Loss   Arthritis    "feel like I have it all over" (08/28/2015)   CHF (congestive heart failure) (HCC)    Complication of anesthesia    DIFFICULT WAKING "only when I was smoking; no  problems since I quit"   Coronary artery disease    Family history of adverse reaction to anesthesia    sister slow to wake up   GERD (gastroesophageal reflux disease)    takes Protonix daily    Hip bursitis    History of blood transfusion    10/2015   Hyperlipidemia LDL goal < 70 06/28/2013   takes Atorvastatin daily   Hypertension    takes Metoprolol and Imdur daily   Hypoxia 01/2018   Malnutrition (HCC)    Migraine    "none in a long time" (08/28/2015)   Myocardial [facility]) 2011   Neuromuscular disorder (HCC)    DIABETIC NEUROPATHY   Osteomyelitis (HCC) 2017   Left foot   PAD (peripheral artery disease) (HCC)    Peripheral vascular disease (HCC)    Respiratory failure (HCC) 10/2015   Acute Hypoxia- acute pulmonary edema 11/13/2015   Septic shock (HCC) 10/2015   Stroke (HCC)    Type II diabetes mellitus (HCC)    takes Lantus nightly.Average fasting blood sugar runs 80-90  Type II   Past Surgical History:  Procedure Laterality Date   ABDOMINAL HYSTERECTOMY     AMPUTATION Right 10/22/2016   Procedure: AMPUTATION DIGIT RIGHT TOES 1-3 POSSIBLE TRANSMETATARSAL;  Surgeon: Chuck Hint, MD;  Location: [facility] OR;  Service: Vascular;  Laterality: Right;   APPENDECTOMY     ATHERECTOMY N/A 06/04/2011   Procedure: ATHERECTOMY;  Surgeon: Runell Gess, MD;  Location: [facility] CATH LAB;  Service: Cardiovascular;  Laterality: N/A;   CARDIAC CATHETERIZATION  10/13/2009   95% stenosis in the AV groove circumflex and 95% ostial stenosis in small OM3. A 3x54mm drug-eluting Promus stent inserted ito the circumflex. Dilatated with a 3.25x90mm noncompliant Quantum balloon within entire segment. The entire region was reduced to 0% and brisk TIMI3 flow.   CAROTID DUPLEX  03/19/2011   Right ICA-demonstrates complete occlusion. Left ICA-demonstrates a small amount of fibrous plaque.   CATARACT EXTRACTION W/ INTRAOCULAR LENS IMPLANT Right    CESAREAN SECTION  1990     CORONARY ANGIOPLASTY     ENDARTERECTOMY FEMORAL Left 09/05/2015   Procedure: ENDARTERECTOMY FEMORAL WITH PROFUNDOPLASTY;  Surgeon: Chuck Hint, MD;  Location: [facility] OR;  Service: Vascular;  Laterality: Left;  Left common femoral artery vein patch using left saphenous vien   ENDARTERECTOMY FEMORAL Right 10/22/2016   Procedure: ENDARTERECTOMY RIGHT COMMON FEMORAL;  Surgeon: Chuck Hint, MD;  Location: [facility] OR;  Service: Vascular;  Laterality: Right;   FEMORAL-POPLITEAL BYPASS GRAFT Left 11/02/2015   Procedure: BYPASS GRAFT FEMORAL-POPLITEAL ARTERY VS FEMORAL-TIBIAL ARTERY BYPASS;  Surgeon: Chuck Hint, MD;  Location: [facility] OR;  Service: Vascular;  Laterality: Left;   FEMORAL-POPLITEAL BYPASS GRAFT Right 10/22/2016   Procedure: BYPASS GRAFT RIGHT FEMORAL- BELOW KNEE POPLITEAL ARTERY WITH VEIN;  Surgeon: Chuck Hint, MD;  Location: [facility] OR;  Service: Vascular;  Laterality: Right;   I&D EXTREMITY Left 11/10/2015   Procedure: Debridement Left Foot Ulcer, Application  Wound VAC;  Surgeon: [name], MD;  Location: [facility] OR;  Service: Orthopedics;  Laterality: Left;   ILIAC ARTERY STENT Left 08/28/2015   common   INTRAOPERATIVE ARTERIOGRAM Left 09/05/2015  Procedure: INTRA OPERATIVE ARTERIOGRAM;  Surgeon: Christopher S Dickson, MD;  Location: [facility] OR;  Service: Vascular;  Laterality: Left;   INTRAOPERATIVE ARTERIOGRAM Left 11/02/2015   Procedure: INTRA OPERATIVE ARTERIOGRAM;  SurChuck HintMD;  Location: [facility] OR;  Service: Vascular;  Laterality: Left;   INTRAOPERATIVE ARTERIOGRAM Right 10/22/2016   Procedure: INTRA OPERATIVE ARTERIOGRAM;  Surgeon: Chuck Hint, MD;  Location: [facility] OR;  Service: Vascular;  Laterality: Right;   LEFT HEART CATH AND CORONARY ANGIOGRAPHY N/A 05/11/2018   Procedure: LEFT HEART CATH AND CORONARY ANGIOGRAPHY;  Surgeon: Lennette Bihari, MD;  Location: [facility] INVASIVE CV LAB;  Service: Cardiovascular;  Laterality: N/A;    LEXISCAN MYOVIEW  10/25/2010   Moderate perfusion defect due to infarct/scar with mild perinfarct ischemia seen in the Basal Inferolateral, Basal Anterolateral, Mid Inferolateral, and Mid Anterolateral regions. Post-stress EF is 50%.   LOWER EXTREMITY ANGIOGRAPHY N/A 10/17/2016   Procedure: Lower Extremity Angiography;  Surgeon: Runell Gess, MD;  Location: [facility] INVASIVE CV LAB;  Service: Cardiovascular;  Laterality: N/A;   OVARY SURGERY  1983?   "ruptured"   PERIPHERAL VASCULAR ANGIOGRAM  01/26/2010   High-grade SFA disease: left greater than right. Left SFA would require fem-pop bypass grafting. Right SFA could be stented but might require Diamondback Orbital atherectomy.   PERIPHERAL VASCULAR ANGIOGRAM  02/23/2010   Stealth Predator orbital rotational atherectomy performed on SFA & Popliteal up to 90,000 RPM. Stenting using overlapping 5x144mm and 5x8mm Absolute Pro Nitinol self-expanding stents beginning just at the knee up to the mid SFA resulting in reduction of 90-95% calcified SFA & Popliteal stenosis to 0. Stenting performed on the distal common & proximal iliac artery with a 10x4 Absolute Pro- 70-0%.   PERIPHERAL VASCULAR ANGIOGRAM  06/17/2010   PTA performed to the right external iliac artery stent using a 5x100 balloon at 10 atmospheres. Stenting performed using a 6x18 Genesis on Opta balloon. Postdilatation with a 7x2 balloon resulting in a 95% "in-stent" stenosis to 0% residual.   PERIPHERAL VASCULAR ANGIOGRAM  06/04/2011   Bilateral total SFAs not percutaneously addressable. Good canidate for femoropopliteal bypass grafting   PERIPHERAL VASCULAR ANGIOGRAM  08/28/2015   PERIPHERAL VASCULAR CATHETERIZATION N/A 08/28/2015   Procedure: Lower Extremity Angiography;  Surgeon: Runell Gess, MD;  Location: [facility] INVASIVE CV LAB;  Service: Cardiovascular;  Laterality: N/A;   PERIPHERAL VASCULAR CATHETERIZATION N/A 08/28/2015   Procedure: Abdominal Aortogram;  Surgeon: Runell Gess,  MD;  Location: [facility] INVASIVE CV LAB;  Service: Cardiovascular;  Laterality: N/A;   PERIPHERAL VASCULAR CATHETERIZATION Left 08/28/2015   Procedure: Peripheral Vascular Intervention;  Surgeon: Runell Gess, MD;  Location: [facility] INVASIVE CV LAB;  Service: Cardiovascular;  Laterality: Left;  common iliac   PERIPHERAL VASCULAR CATHETERIZATION Left 08/28/2015   Procedure: Peripheral Vascular Atherectomy;  Surgeon: Runell Gess, MD;  Location: [facility] INVASIVE CV LAB;  Service: Cardiovascular;  Laterality: Left;  common iliac   PERIPHERAL VASCULAR CATHETERIZATION N/A 09/28/2015   Procedure: Lower Extremity Angiography;  Surgeon: Runell Gess, MD;  Location: [facility] INVASIVE CV LAB;  Service: Cardiovascular;  Laterality: N/A;   PERIPHERAL VASCULAR CATHETERIZATION Left 09/28/2015   Procedure: Peripheral Vascular Intervention;  Surgeon: Runell Gess, MD;  Location: [facility] INVASIVE CV LAB;  Service: Cardiovascular;  Laterality: Left CFA  PCI with 9 mm x 4 cm Abbott nitinol absolute Pro self-expanding stent      SKIN SPLIT GRAFT Left 12/01/2015   Procedure: LEFT FOOT SKIN GRAFT AND VAC;  Surgeon: Berna Spare  Kandis Mannan, MD;  Location: [facility] OR;  Service: Orthopedics;  Laterality: Left;   THROMBECTOMY FEMORAL ARTERY Right 10/22/2016   Procedure: THROMBECTOMY FEMORAL ARTERY;  Surgeon: Chuck Hint, MD;  Location: [facility] OR;  Service: Vascular;  Laterality: Right;   TRANSTHORACIC ECHOCARDIOGRAM  10/17/2009   EF 45-50%, moderate hypokinesis of the entire inferolateral myocardium, mild concentric hypertrophy and mild regurg of the mitral valva.   VEIN HARVEST Left 11/02/2015   Procedure: LEFT GREATER SAPHENOUS VEIN HARVEST;  Surgeon: Chuck Hint, MD;  Location: [facility] OR;  Service: Vascular;  Laterality: Left;     Current Meds  Medication Sig   aspirin EC 81 MG tablet Take 81 mg by mouth daily.   atorvastatin (LIPITOR) 80 MG tablet Take 80 mg by mouth. for hyperlipidemia   clopidogrel (PLAVIX) 75 MG tablet  Take 75 mg by mouth. for PVD   furosemide (LASIX) 40 MG tablet Take 1 tablet (40 mg total) by mouth 2 (two) times daily.   gabapentin (NEURONTIN) 300 MG capsule Take 1 capsule (300 mg total) by mouth at bedtime.   isosorbide mononitrate (IMDUR) 60 MG 24 hr tablet Take 1 tablet (60 mg total) by mouth daily. (Take along with 30 mg to equal 90 mg daily) (Patient taking differently: Take 90 mg by mouth daily. Take 90 mg (1.5 tablets) daily)   LEVEMIR FLEXTOUCH 100 UNIT/ML Pen Inject 35 Units into the skin at bedtime.    metFORMIN (GLUCOPHAGE) 1000 MG tablet Take 1,000 mg by mouth 2 (two) times daily with a meal.   metoprolol succinate (TOPROL-XL) 50 MG 24 hr tablet TAKE 1 AND 1/2 TABLETS BY MOUTH DAILY (Patient taking differently: Take 75 mg by mouth daily. )   pantoprazole (PROTONIX) 40 MG tablet TAKE 1 TABLET BY MOUTH DAILY   potassium chloride (K-DUR,KLOR-CON) 10 MEQ tablet Take 10 mEq by mouth daily.      Allergies:   Doxycycline; Hydrochlorothiazide; Latex; and Penicillins   Social History   Tobacco Use   Smoking status: Former Smoker    Packs/day: 1.50    Years: 41.00    Pack years: 61.50    Types: Cigarettes    Last attempt to quit: 10/12/2009    Years since quitting: 8.6   Smokeless tobacco: Never Used  Substance Use Topics   Alcohol use: No   Drug use: No     Family Hx: The patient's family history includes Diabetes in her father; Heart failure in her father and mother; Hypertension in her mother; Stroke in her father.  ROS:   Please see the history of present illness.     All other systems reviewed and are negative.   Prior CV studies:   The following studies were reviewed today:  Echo 05/09/2018 1. The left ventricle had a visually estimated ejection fraction of of 45%. The cavity size was normal. Left ventricular diastolic Doppler parameters are consistent with pseudonormal. Elevated left ventricular end-diastolic pressure The E/e' is 16.1.  Left  ventricular diffuse hypokinesis.  2. The right ventricle has normal systolc function. The cavity was normal. There is no increase in right ventricular wall thickness. Right ventricular systolic pressure could not be assessed.  3. The mitral valve is abnormal. Mild thickening of the mitral valve leaflet. There is moderate mitral annular calcification present.  4. The inferior vena cava was dilated in size with <50% respiratory variability.  5. When compared to the prior study: Side by side comparison of images performed to 02/13/2018 exam. No significant change in  LV function or wall motion.   Labs/Other Tests and Data Reviewed:    EKG:  An ECG dated 05/12/2018 was personally reviewed today and demonstrated:  Normal sinus rhythm without significant ST-T wave changes.  Recent Labs: 02/14/2018: ALT 14; Magnesium 1.5 05/08/2018: B Natriuretic Peptide 624.8 05/13/2018: BUN 15; Creatinine, Ser 1.07; Hemoglobin 8.5; Platelets 212; Potassium 4.3; Sodium 140   Recent Lipid Panel Lab Results  Component Value Date/Time   CHOL 211 (H) 05/10/2018 02:31 AM   TRIG 386 (H) 05/10/2018 02:31 AM   HDL 30 (L) 05/10/2018 02:31 AM   CHOLHDL 7.0 05/10/2018 02:31 AM   LDLCALC 104 (H) 05/10/2018 02:31 AM    Wt Readings from Last 3 Encounters:  05/12/18 185 lb 13.6 oz (84.3 kg)  02/21/18 194 lb 4.8 oz (88.1 kg)  10/01/17 190 lb (86.2 kg)     Objective:    Vital Signs:  There were no vitals taken for this visit.   VITAL SIGNS:  reviewed  No vital signs were available  ASSESSMENT & PLAN:    1. CAD: Patient underwent cardiac catheterization recently, she was found to have significant left main and left circumflex disease, she was turned down by cardiothoracic surgery.  Her symptom is managed medically at this time.  No angina since discharge.  2. PVD: History of femoropopliteal bypass.  Last lower extremity Doppler in September 2019 showed patent bypass graft.  3. Chronic combined systolic and diastolic  heart failure: Recently treated for heart failure, symptom has improved since discharge.  Weight is quite stable at this time.  We discussed heart failure prevention plan today include salt restriction, fluid restriction and daily weight.  4. Hypertension: Continue on current therapy.  5. Hyperlipidemia: On Lipitor 80 mg daily.  6. DM2: Managed by primary care provider.  7. AKI: Recent AKI with creatinine trended up to 2.0, improved prior to discharge.  Basic metabolic panel was obtained at PCPs office today.  8. History of CVA: No recurrence.  COVID-19 Education: The signs and symptoms of COVID-19 were discussed with the patient and how to seek care for testing (follow up with PCP or arrange E-visit). The importance of social distancing was discussed today.  Time:   Today, I have spent 10 minutes with the patient with telehealth technology discussing the above problems.     Medication Adjustments/Labs and Tests Ordered: Current medicines are reviewed at length with the patient today.  Concerns regarding medicines are outlined above.   Tests Ordered: No orders of the defined types were placed in this encounter.   Medication Changes: No orders of the defined types were placed in this encounter.   Disposition:  Follow up in 4 month(s)  Signed, Azalee Course, [place]  05/28/2018 12:01 AM    [facility]

## 2018-06-10 DIAGNOSIS — N183 Chronic kidney disease, stage 3 (moderate): Secondary | ICD-10-CM | POA: Diagnosis not present

## 2018-06-20 ENCOUNTER — Other Ambulatory Visit: Payer: Self-pay | Admitting: Cardiovascular Disease

## 2018-06-23 ENCOUNTER — Other Ambulatory Visit: Payer: Self-pay | Admitting: Cardiovascular Disease

## 2018-07-07 DIAGNOSIS — E1159 Type 2 diabetes mellitus with other circulatory complications: Secondary | ICD-10-CM | POA: Diagnosis not present

## 2018-07-07 DIAGNOSIS — I10 Essential (primary) hypertension: Secondary | ICD-10-CM | POA: Diagnosis not present

## 2018-07-07 DIAGNOSIS — Z89411 Acquired absence of right great toe: Secondary | ICD-10-CM | POA: Diagnosis not present

## 2018-07-07 DIAGNOSIS — G629 Polyneuropathy, unspecified: Secondary | ICD-10-CM | POA: Diagnosis not present

## 2018-07-07 DIAGNOSIS — I509 Heart failure, unspecified: Secondary | ICD-10-CM | POA: Diagnosis not present

## 2018-07-07 DIAGNOSIS — Z95828 Presence of other vascular implants and grafts: Secondary | ICD-10-CM | POA: Diagnosis not present

## 2018-07-07 DIAGNOSIS — I700 Atherosclerosis of aorta: Secondary | ICD-10-CM | POA: Diagnosis not present

## 2018-07-07 DIAGNOSIS — E1140 Type 2 diabetes mellitus with diabetic neuropathy, unspecified: Secondary | ICD-10-CM | POA: Diagnosis not present

## 2018-07-07 DIAGNOSIS — I739 Peripheral vascular disease, unspecified: Secondary | ICD-10-CM | POA: Diagnosis not present

## 2018-07-07 DIAGNOSIS — J449 Chronic obstructive pulmonary disease, unspecified: Secondary | ICD-10-CM | POA: Diagnosis not present

## 2018-07-17 DIAGNOSIS — E1140 Type 2 diabetes mellitus with diabetic neuropathy, unspecified: Secondary | ICD-10-CM | POA: Diagnosis not present

## 2018-07-17 DIAGNOSIS — I2510 Atherosclerotic heart disease of native coronary artery without angina pectoris: Secondary | ICD-10-CM | POA: Diagnosis not present

## 2018-07-17 DIAGNOSIS — E782 Mixed hyperlipidemia: Secondary | ICD-10-CM | POA: Diagnosis not present

## 2018-07-17 DIAGNOSIS — E1159 Type 2 diabetes mellitus with other circulatory complications: Secondary | ICD-10-CM | POA: Diagnosis not present

## 2018-07-17 DIAGNOSIS — N183 Chronic kidney disease, stage 3 (moderate): Secondary | ICD-10-CM | POA: Diagnosis not present

## 2018-07-17 DIAGNOSIS — I252 Old myocardial infarction: Secondary | ICD-10-CM | POA: Diagnosis not present

## 2018-07-17 DIAGNOSIS — I10 Essential (primary) hypertension: Secondary | ICD-10-CM | POA: Diagnosis not present

## 2018-07-17 DIAGNOSIS — I509 Heart failure, unspecified: Secondary | ICD-10-CM | POA: Diagnosis not present

## 2018-07-17 DIAGNOSIS — J449 Chronic obstructive pulmonary disease, unspecified: Secondary | ICD-10-CM | POA: Diagnosis not present

## 2018-07-17 DIAGNOSIS — Z794 Long term (current) use of insulin: Secondary | ICD-10-CM | POA: Diagnosis not present

## 2018-08-14 DIAGNOSIS — I2510 Atherosclerotic heart disease of native coronary artery without angina pectoris: Secondary | ICD-10-CM | POA: Diagnosis not present

## 2018-08-14 DIAGNOSIS — R0609 Other forms of dyspnea: Secondary | ICD-10-CM | POA: Diagnosis not present

## 2018-08-14 DIAGNOSIS — I509 Heart failure, unspecified: Secondary | ICD-10-CM | POA: Diagnosis not present

## 2018-08-26 DIAGNOSIS — E1159 Type 2 diabetes mellitus with other circulatory complications: Secondary | ICD-10-CM | POA: Diagnosis not present

## 2018-08-26 DIAGNOSIS — E782 Mixed hyperlipidemia: Secondary | ICD-10-CM | POA: Diagnosis not present

## 2018-08-26 DIAGNOSIS — I509 Heart failure, unspecified: Secondary | ICD-10-CM | POA: Diagnosis not present

## 2018-08-26 DIAGNOSIS — I252 Old myocardial infarction: Secondary | ICD-10-CM | POA: Diagnosis not present

## 2018-08-26 DIAGNOSIS — N183 Chronic kidney disease, stage 3 (moderate): Secondary | ICD-10-CM | POA: Diagnosis not present

## 2018-08-26 DIAGNOSIS — E1140 Type 2 diabetes mellitus with diabetic neuropathy, unspecified: Secondary | ICD-10-CM | POA: Diagnosis not present

## 2018-08-26 DIAGNOSIS — I10 Essential (primary) hypertension: Secondary | ICD-10-CM | POA: Diagnosis not present

## 2018-08-26 DIAGNOSIS — Z794 Long term (current) use of insulin: Secondary | ICD-10-CM | POA: Diagnosis not present

## 2018-08-26 DIAGNOSIS — J449 Chronic obstructive pulmonary disease, unspecified: Secondary | ICD-10-CM | POA: Diagnosis not present

## 2018-08-26 DIAGNOSIS — I2510 Atherosclerotic heart disease of native coronary artery without angina pectoris: Secondary | ICD-10-CM | POA: Diagnosis not present

## 2018-09-22 DIAGNOSIS — I509 Heart failure, unspecified: Secondary | ICD-10-CM | POA: Diagnosis not present

## 2018-09-22 DIAGNOSIS — N183 Chronic kidney disease, stage 3 (moderate): Secondary | ICD-10-CM | POA: Diagnosis not present

## 2018-09-22 DIAGNOSIS — E1159 Type 2 diabetes mellitus with other circulatory complications: Secondary | ICD-10-CM | POA: Diagnosis not present

## 2018-09-22 DIAGNOSIS — E1140 Type 2 diabetes mellitus with diabetic neuropathy, unspecified: Secondary | ICD-10-CM | POA: Diagnosis not present

## 2018-09-22 DIAGNOSIS — I10 Essential (primary) hypertension: Secondary | ICD-10-CM | POA: Diagnosis not present

## 2018-09-22 DIAGNOSIS — E782 Mixed hyperlipidemia: Secondary | ICD-10-CM | POA: Diagnosis not present

## 2018-09-22 DIAGNOSIS — J449 Chronic obstructive pulmonary disease, unspecified: Secondary | ICD-10-CM | POA: Diagnosis not present

## 2018-09-22 DIAGNOSIS — I252 Old myocardial infarction: Secondary | ICD-10-CM | POA: Diagnosis not present

## 2018-09-22 DIAGNOSIS — I2510 Atherosclerotic heart disease of native coronary artery without angina pectoris: Secondary | ICD-10-CM | POA: Diagnosis not present

## 2018-11-04 DIAGNOSIS — I252 Old myocardial infarction: Secondary | ICD-10-CM | POA: Diagnosis not present

## 2018-11-04 DIAGNOSIS — I2510 Atherosclerotic heart disease of native coronary artery without angina pectoris: Secondary | ICD-10-CM | POA: Diagnosis not present

## 2018-11-04 DIAGNOSIS — E782 Mixed hyperlipidemia: Secondary | ICD-10-CM | POA: Diagnosis not present

## 2018-11-04 DIAGNOSIS — E1140 Type 2 diabetes mellitus with diabetic neuropathy, unspecified: Secondary | ICD-10-CM | POA: Diagnosis not present

## 2018-11-04 DIAGNOSIS — N1831 Chronic kidney disease, stage 3a: Secondary | ICD-10-CM | POA: Diagnosis not present

## 2018-11-04 DIAGNOSIS — I10 Essential (primary) hypertension: Secondary | ICD-10-CM | POA: Diagnosis not present

## 2018-11-04 DIAGNOSIS — E1159 Type 2 diabetes mellitus with other circulatory complications: Secondary | ICD-10-CM | POA: Diagnosis not present

## 2018-11-04 DIAGNOSIS — J449 Chronic obstructive pulmonary disease, unspecified: Secondary | ICD-10-CM | POA: Diagnosis not present

## 2018-11-04 DIAGNOSIS — I509 Heart failure, unspecified: Secondary | ICD-10-CM | POA: Diagnosis not present

## 2018-11-23 DIAGNOSIS — E1159 Type 2 diabetes mellitus with other circulatory complications: Secondary | ICD-10-CM | POA: Diagnosis not present

## 2018-11-23 DIAGNOSIS — I10 Essential (primary) hypertension: Secondary | ICD-10-CM | POA: Diagnosis not present

## 2018-11-23 DIAGNOSIS — E1165 Type 2 diabetes mellitus with hyperglycemia: Secondary | ICD-10-CM | POA: Diagnosis not present

## 2018-11-23 DIAGNOSIS — Z1389 Encounter for screening for other disorder: Secondary | ICD-10-CM | POA: Diagnosis not present

## 2018-11-23 DIAGNOSIS — E1140 Type 2 diabetes mellitus with diabetic neuropathy, unspecified: Secondary | ICD-10-CM | POA: Diagnosis not present

## 2018-11-23 DIAGNOSIS — I252 Old myocardial infarction: Secondary | ICD-10-CM | POA: Diagnosis not present

## 2018-11-23 DIAGNOSIS — K219 Gastro-esophageal reflux disease without esophagitis: Secondary | ICD-10-CM | POA: Diagnosis not present

## 2018-11-23 DIAGNOSIS — G629 Polyneuropathy, unspecified: Secondary | ICD-10-CM | POA: Diagnosis not present

## 2018-11-23 DIAGNOSIS — E782 Mixed hyperlipidemia: Secondary | ICD-10-CM | POA: Diagnosis not present

## 2018-11-23 DIAGNOSIS — Z23 Encounter for immunization: Secondary | ICD-10-CM | POA: Diagnosis not present

## 2018-11-23 DIAGNOSIS — I2510 Atherosclerotic heart disease of native coronary artery without angina pectoris: Secondary | ICD-10-CM | POA: Diagnosis not present

## 2018-11-23 DIAGNOSIS — I509 Heart failure, unspecified: Secondary | ICD-10-CM | POA: Diagnosis not present

## 2018-12-21 ENCOUNTER — Other Ambulatory Visit: Payer: Self-pay

## 2018-12-21 MED ORDER — PANTOPRAZOLE SODIUM 40 MG PO TBEC: 40.0000 mg | DELAYED_RELEASE_TABLET | Freq: Every day | ORAL | 1 refills | Status: DC

## 2019-01-28 DIAGNOSIS — E1140 Type 2 diabetes mellitus with diabetic neuropathy, unspecified: Secondary | ICD-10-CM | POA: Diagnosis not present

## 2019-01-28 DIAGNOSIS — I509 Heart failure, unspecified: Secondary | ICD-10-CM | POA: Diagnosis not present

## 2019-01-28 DIAGNOSIS — I2510 Atherosclerotic heart disease of native coronary artery without angina pectoris: Secondary | ICD-10-CM | POA: Diagnosis not present

## 2019-01-28 DIAGNOSIS — I252 Old myocardial infarction: Secondary | ICD-10-CM | POA: Diagnosis not present

## 2019-01-28 DIAGNOSIS — I10 Essential (primary) hypertension: Secondary | ICD-10-CM | POA: Diagnosis not present

## 2019-01-28 DIAGNOSIS — J449 Chronic obstructive pulmonary disease, unspecified: Secondary | ICD-10-CM | POA: Diagnosis not present

## 2019-01-28 DIAGNOSIS — E782 Mixed hyperlipidemia: Secondary | ICD-10-CM | POA: Diagnosis not present

## 2019-01-28 DIAGNOSIS — E1159 Type 2 diabetes mellitus with other circulatory complications: Secondary | ICD-10-CM | POA: Diagnosis not present

## 2019-02-19 DIAGNOSIS — I2510 Atherosclerotic heart disease of native coronary artery without angina pectoris: Secondary | ICD-10-CM | POA: Diagnosis not present

## 2019-02-19 DIAGNOSIS — E1140 Type 2 diabetes mellitus with diabetic neuropathy, unspecified: Secondary | ICD-10-CM | POA: Diagnosis not present

## 2019-02-19 DIAGNOSIS — E782 Mixed hyperlipidemia: Secondary | ICD-10-CM | POA: Diagnosis not present

## 2019-02-19 DIAGNOSIS — E1159 Type 2 diabetes mellitus with other circulatory complications: Secondary | ICD-10-CM | POA: Diagnosis not present

## 2019-02-19 DIAGNOSIS — I252 Old myocardial infarction: Secondary | ICD-10-CM | POA: Diagnosis not present

## 2019-02-19 DIAGNOSIS — I509 Heart failure, unspecified: Secondary | ICD-10-CM | POA: Diagnosis not present

## 2019-02-19 DIAGNOSIS — I10 Essential (primary) hypertension: Secondary | ICD-10-CM | POA: Diagnosis not present

## 2019-02-19 DIAGNOSIS — J449 Chronic obstructive pulmonary disease, unspecified: Secondary | ICD-10-CM | POA: Diagnosis not present

## 2019-03-09 DIAGNOSIS — E782 Mixed hyperlipidemia: Secondary | ICD-10-CM | POA: Diagnosis not present

## 2019-03-09 DIAGNOSIS — I2510 Atherosclerotic heart disease of native coronary artery without angina pectoris: Secondary | ICD-10-CM | POA: Diagnosis not present

## 2019-03-09 DIAGNOSIS — E1140 Type 2 diabetes mellitus with diabetic neuropathy, unspecified: Secondary | ICD-10-CM | POA: Diagnosis not present

## 2019-03-09 DIAGNOSIS — J449 Chronic obstructive pulmonary disease, unspecified: Secondary | ICD-10-CM | POA: Diagnosis not present

## 2019-03-09 DIAGNOSIS — I252 Old myocardial infarction: Secondary | ICD-10-CM | POA: Diagnosis not present

## 2019-03-09 DIAGNOSIS — I4891 Unspecified atrial fibrillation: Secondary | ICD-10-CM | POA: Diagnosis not present

## 2019-03-09 DIAGNOSIS — E1159 Type 2 diabetes mellitus with other circulatory complications: Secondary | ICD-10-CM | POA: Diagnosis not present

## 2019-03-09 DIAGNOSIS — I509 Heart failure, unspecified: Secondary | ICD-10-CM | POA: Diagnosis not present

## 2019-03-09 DIAGNOSIS — I10 Essential (primary) hypertension: Secondary | ICD-10-CM | POA: Diagnosis not present

## 2019-03-11 ENCOUNTER — Encounter (HOSPITAL_COMMUNITY): Payer: Self-pay

## 2019-03-11 ENCOUNTER — Emergency Department (HOSPITAL_COMMUNITY): Payer: Medicare Other

## 2019-03-11 ENCOUNTER — Inpatient Hospital Stay (HOSPITAL_COMMUNITY)
Admission: EM | Admit: 2019-03-11 | Discharge: 2019-03-15 | DRG: 291 | Disposition: A | Discharge status: Home / Self Care | Payer: Medicare Other | Attending: Internal Medicine | Admitting: Internal Medicine

## 2019-03-11 ENCOUNTER — Other Ambulatory Visit: Payer: Self-pay

## 2019-03-11 DIAGNOSIS — Z91040 Latex allergy status: Secondary | ICD-10-CM

## 2019-03-11 DIAGNOSIS — R7989 Other specified abnormal findings of blood chemistry: Secondary | ICD-10-CM | POA: Diagnosis not present

## 2019-03-11 DIAGNOSIS — R079 Chest pain, unspecified: Secondary | ICD-10-CM | POA: Diagnosis present

## 2019-03-11 DIAGNOSIS — E785 Hyperlipidemia, unspecified: Secondary | ICD-10-CM | POA: Diagnosis present

## 2019-03-11 DIAGNOSIS — Z880 Allergy status to penicillin: Secondary | ICD-10-CM

## 2019-03-11 DIAGNOSIS — Z833 Family history of diabetes mellitus: Secondary | ICD-10-CM

## 2019-03-11 DIAGNOSIS — R0600 Dyspnea, unspecified: Secondary | ICD-10-CM | POA: Diagnosis not present

## 2019-03-11 DIAGNOSIS — Z888 Allergy status to other drugs, medicaments and biological substances status: Secondary | ICD-10-CM

## 2019-03-11 DIAGNOSIS — M1990 Unspecified osteoarthritis, unspecified site: Secondary | ICD-10-CM | POA: Diagnosis not present

## 2019-03-11 DIAGNOSIS — K219 Gastro-esophageal reflux disease without esophagitis: Secondary | ICD-10-CM | POA: Diagnosis present

## 2019-03-11 DIAGNOSIS — E1165 Type 2 diabetes mellitus with hyperglycemia: Secondary | ICD-10-CM | POA: Diagnosis not present

## 2019-03-11 DIAGNOSIS — I2510 Atherosclerotic heart disease of native coronary artery without angina pectoris: Secondary | ICD-10-CM | POA: Diagnosis not present

## 2019-03-11 DIAGNOSIS — D631 Anemia in chronic kidney disease: Secondary | ICD-10-CM | POA: Diagnosis present

## 2019-03-11 DIAGNOSIS — Z7902 Long term (current) use of antithrombotics/antiplatelets: Secondary | ICD-10-CM

## 2019-03-11 DIAGNOSIS — R0602 Shortness of breath: Secondary | ICD-10-CM | POA: Diagnosis not present

## 2019-03-11 DIAGNOSIS — E1151 Type 2 diabetes mellitus with diabetic peripheral angiopathy without gangrene: Secondary | ICD-10-CM | POA: Diagnosis present

## 2019-03-11 DIAGNOSIS — E118 Type 2 diabetes mellitus with unspecified complications: Secondary | ICD-10-CM | POA: Diagnosis not present

## 2019-03-11 DIAGNOSIS — N1832 Chronic kidney disease, stage 3b: Secondary | ICD-10-CM | POA: Diagnosis present

## 2019-03-11 DIAGNOSIS — I110 Hypertensive heart disease with heart failure: Secondary | ICD-10-CM | POA: Diagnosis not present

## 2019-03-11 DIAGNOSIS — Z7982 Long term (current) use of aspirin: Secondary | ICD-10-CM

## 2019-03-11 DIAGNOSIS — I252 Old myocardial infarction: Secondary | ICD-10-CM

## 2019-03-11 DIAGNOSIS — I5043 Acute on chronic combined systolic (congestive) and diastolic (congestive) heart failure: Secondary | ICD-10-CM | POA: Diagnosis present

## 2019-03-11 DIAGNOSIS — Z794 Long term (current) use of insulin: Secondary | ICD-10-CM

## 2019-03-11 DIAGNOSIS — Z89421 Acquired absence of other right toe(s): Secondary | ICD-10-CM

## 2019-03-11 DIAGNOSIS — Z951 Presence of aortocoronary bypass graft: Secondary | ICD-10-CM

## 2019-03-11 DIAGNOSIS — Z823 Family history of stroke: Secondary | ICD-10-CM

## 2019-03-11 DIAGNOSIS — R778 Other specified abnormalities of plasma proteins: Secondary | ICD-10-CM | POA: Diagnosis not present

## 2019-03-11 DIAGNOSIS — I130 Hypertensive heart and chronic kidney disease with heart failure and stage 1 through stage 4 chronic kidney disease, or unspecified chronic kidney disease: Principal | ICD-10-CM | POA: Diagnosis present

## 2019-03-11 DIAGNOSIS — E1142 Type 2 diabetes mellitus with diabetic polyneuropathy: Secondary | ICD-10-CM

## 2019-03-11 DIAGNOSIS — Z8249 Family history of ischemic heart disease and other diseases of the circulatory system: Secondary | ICD-10-CM

## 2019-03-11 DIAGNOSIS — Z9861 Coronary angioplasty status: Secondary | ICD-10-CM

## 2019-03-11 DIAGNOSIS — Z87891 Personal history of nicotine dependence: Secondary | ICD-10-CM

## 2019-03-11 DIAGNOSIS — I509 Heart failure, unspecified: Secondary | ICD-10-CM

## 2019-03-11 DIAGNOSIS — Z20822 Contact with and (suspected) exposure to covid-19: Secondary | ICD-10-CM | POA: Diagnosis not present

## 2019-03-11 DIAGNOSIS — Z79899 Other long term (current) drug therapy: Secondary | ICD-10-CM

## 2019-03-11 DIAGNOSIS — IMO0002 Reserved for concepts with insufficient information to code with codable children: Secondary | ICD-10-CM | POA: Diagnosis present

## 2019-03-11 DIAGNOSIS — Z8673 Personal history of transient ischemic attack (TIA), and cerebral infarction without residual deficits: Secondary | ICD-10-CM

## 2019-03-11 DIAGNOSIS — E1140 Type 2 diabetes mellitus with diabetic neuropathy, unspecified: Secondary | ICD-10-CM | POA: Diagnosis present

## 2019-03-11 DIAGNOSIS — Z961 Presence of intraocular lens: Secondary | ICD-10-CM | POA: Diagnosis present

## 2019-03-11 DIAGNOSIS — I5021 Acute systolic (congestive) heart failure: Secondary | ICD-10-CM | POA: Diagnosis present

## 2019-03-11 DIAGNOSIS — Z9841 Cataract extraction status, right eye: Secondary | ICD-10-CM

## 2019-03-11 LAB — BASIC METABOLIC PANEL
Anion gap: 13 (ref 5–15)
BUN: 35 mg/dL — ABNORMAL HIGH (ref 8–23)
CO2: 21 mmol/L — ABNORMAL LOW (ref 22–32)
Calcium: 9.1 mg/dL (ref 8.9–10.3)
Chloride: 105 mmol/L (ref 98–111)
Creatinine, Ser: 1.34 mg/dL — ABNORMAL HIGH (ref 0.44–1.00)
GFR calc Af Amer: 47 mL/min — ABNORMAL LOW (ref >60)
GFR calc non Af Amer: 41 mL/min — ABNORMAL LOW (ref >60)
Glucose, Bld: 157 mg/dL — ABNORMAL HIGH (ref 70–99)
Potassium: 5 mmol/L (ref 3.5–5.1)
Sodium: 139 mmol/L (ref 135–145)

## 2019-03-11 LAB — CBG MONITORING, ED: Glucose-Capillary: 124 mg/dL — ABNORMAL HIGH (ref 70–99)

## 2019-03-11 LAB — CBC
HCT: 31 % — ABNORMAL LOW (ref 36.0–46.0)
Hemoglobin: 10.1 g/dL — ABNORMAL LOW (ref 12.0–15.0)
MCH: 28.1 pg (ref 26.0–34.0)
MCHC: 32.6 g/dL (ref 30.0–36.0)
MCV: 86.1 fL (ref 80.0–100.0)
Platelets: 310 10*3/uL (ref 150–400)
RBC: 3.6 MIL/uL — ABNORMAL LOW (ref 3.87–5.11)
RDW: 14.5 % (ref 11.5–15.5)
WBC: 9.2 10*3/uL (ref 4.0–10.5)
nRBC: 0 % (ref 0.0–0.2)

## 2019-03-11 LAB — TROPONIN I (HIGH SENSITIVITY)
Troponin I (High Sensitivity): 123 ng/L (ref <18)
Troponin I (High Sensitivity): 110 ng/L (ref <18)

## 2019-03-11 LAB — BRAIN NATRIURETIC PEPTIDE: B Natriuretic Peptide: 2460.7 pg/mL — ABNORMAL HIGH (ref 0.0–100.0)

## 2019-03-11 MED ORDER — ACETAMINOPHEN 325 MG PO TABS
650.0000 mg | ORAL_TABLET | Freq: Four times a day (QID) | ORAL | Status: DC | PRN
  Administered 2019-03-12: 05:00:00 650 mg via ORAL
  Filled 2019-03-11: qty 2

## 2019-03-11 MED ORDER — ACETAMINOPHEN 650 MG RE SUPP: 650.0000 mg | Freq: Four times a day (QID) | RECTAL | Status: DC | PRN

## 2019-03-11 MED ORDER — SODIUM CHLORIDE 0.9% FLUSH
3.0000 mL | Freq: Two times a day (BID) | INTRAVENOUS | Status: DC
  Administered 2019-03-12 – 2019-03-15 (×7): 3 mL via INTRAVENOUS

## 2019-03-11 MED ORDER — SODIUM CHLORIDE 0.9 % IV SOLN: 250.0000 mL | INTRAVENOUS | Status: DC | PRN

## 2019-03-11 MED ORDER — INSULIN ASPART 100 UNIT/ML ~~LOC~~ SOLN
0.0000 [IU] | SUBCUTANEOUS | Status: DC
  Administered 2019-03-11: 1 [IU] via SUBCUTANEOUS
  Administered 2019-03-12 (×2): 2 [IU] via SUBCUTANEOUS
  Administered 2019-03-12 – 2019-03-13 (×4): 1 [IU] via SUBCUTANEOUS
  Administered 2019-03-13 (×2): 2 [IU] via SUBCUTANEOUS
  Administered 2019-03-13: 3 [IU] via SUBCUTANEOUS
  Administered 2019-03-13: 2 [IU] via SUBCUTANEOUS
  Administered 2019-03-13: 3 [IU] via SUBCUTANEOUS
  Administered 2019-03-14: 1 [IU] via SUBCUTANEOUS
  Administered 2019-03-14: 3 [IU] via SUBCUTANEOUS
  Administered 2019-03-14: 1 [IU] via SUBCUTANEOUS
  Administered 2019-03-14: 2 [IU] via SUBCUTANEOUS
  Administered 2019-03-14: 3 [IU] via SUBCUTANEOUS
  Administered 2019-03-14 – 2019-03-15 (×2): 2 [IU] via SUBCUTANEOUS
  Administered 2019-03-15 (×2): 1 [IU] via SUBCUTANEOUS

## 2019-03-11 MED ORDER — FUROSEMIDE 10 MG/ML IJ SOLN
80.0000 mg | Freq: Once | INTRAMUSCULAR | Status: AC
  Administered 2019-03-11: 21:00:00 80 mg via INTRAVENOUS
  Filled 2019-03-11: qty 8

## 2019-03-11 MED ORDER — HEPARIN SODIUM (PORCINE) 5000 UNIT/ML IJ SOLN
5000.0000 [IU] | Freq: Three times a day (TID) | INTRAMUSCULAR | Status: DC
  Administered 2019-03-11 – 2019-03-15 (×10): 5000 [IU] via SUBCUTANEOUS
  Filled 2019-03-11 (×10): qty 1

## 2019-03-11 MED ORDER — SODIUM CHLORIDE 0.9% FLUSH: 3.0000 mL | INTRAVENOUS | Status: DC | PRN

## 2019-03-11 MED ORDER — SODIUM CHLORIDE 0.9% FLUSH: 3.0000 mL | Freq: Once | INTRAVENOUS | Status: DC

## 2019-03-11 NOTE — ED Provider Notes (Signed)
[facility] EMERGENCY DEPARTMENT Provider Note   CSN: [id number] Arrival date & time: 03/11/19  1805   History Chief Complaint  Patient presents with  . Chest Pain   [name] [name] is a 69 y.o. female with past medical history significant for CHF, CAD, CABG, hypertension, hyperlipidemia, PAD who presents for evaluation of chest pain two days ago and sob x 1 week.  PCP and was noted to have elevated cardiac enzymes.  Patient states on Tuesday she had a 30-minute episode of substernal chest pain.  Noted bilateral arm pain however no jaw pain at that time.  Patient states she has had shortness of breath lower extremity edema which is been worsening over the last week despite Lasix.  She has been compliant with her Lasix at home.  States her weight has been only 2 to 3 pounds up over the last week.  States she took nitro on Tuesday which resolved her pain.  She has no current chest pain x 2 days however admits to continued shortness of breath.  Patient admitted for NSTEMI April 2020.  Had PCI which showed severe two-vessel disease.  CT surgery was consulted for CABG revision however patient was not felt to be surgical candidate due to her comorbidities and her prior CABG procedure.  Recommended medical management.  History obtained from patient and past medical records.  No interpreter is used.  PCP- Dr. Donette Larry  HPI     Past Medical History:  Diagnosis Date  . Anemia 10/2015   Acute Blood Loss  . Arthritis    "feel like I have it all over" (08/28/2015)  . CHF (congestive heart failure) (HCC)   . Complication of anesthesia    DIFFICULT WAKING "only when I was smoking; no problems since I quit"  . Coronary artery disease   . Family history of adverse reaction to anesthesia    sister slow to wake up  . GERD (gastroesophageal reflux disease)    takes Protonix daily   . Hip bursitis   . History of blood transfusion    10/2015  . Hyperlipidemia LDL goal < 70 06/28/2013   takes Atorvastatin daily  . Hypertension    takes Metoprolol and Imdur daily  . Hypoxia 01/2018  . Malnutrition (HCC)   . Migraine    "none in a long time" (08/28/2015)  . Myocardial infarction (HCC) 2011  . Neuromuscular disorder (HCC)    DIABETIC NEUROPATHY  . Osteomyelitis (HCC) 2017   Left foot  . PAD (peripheral artery disease) (HCC)   . Peripheral vascular disease (HCC)   . Respiratory failure (HCC) 10/2015   Acute Hypoxia- acute pulmonary edema 11/13/2015  . Septic shock (HCC) 10/2015  . Stroke (HCC)   . Type II diabetes mellitus (HCC)    takes Lantus nightly.Average fasting blood sugar runs 80-90  Type II    Patient Active Problem List   Diagnosis Date Noted  . Dyspnea 03/11/2019  . Elevated troponin 03/11/2019  . Chest pain 03/11/2019  . Acute on chronic combined systolic and diastolic CHF (congestive heart failure) (HCC) 05/08/2018  . Renal insufficiency 02/13/2018  . CKD (chronic kidney disease) stage 3, GFR 30-59 ml/min 02/13/2018  . Acute systolic CHF (congestive heart failure) (HCC) 02/13/2018  . Benign hypertension 11/26/2017  . Gangrene of right foot (HCC) 11/01/2016  . GERD (gastroesophageal reflux disease) 11/01/2016  . Gangrene of left foot (HCC) 10/17/2016  . Sepsis (HCC) 10/17/2016  . Midfoot ulcer, left, limited to breakdown of skin (  HCC) 01/08/2016  . Thigh hematoma 12/22/2015  . History of blood transfusion 12/22/2015  . Acute on chronic systolic CHF (congestive heart failure) (HCC) 12/22/2015  . H/O skin graft 12/11/2015  . SEMI (subendocardial myocardial infarction) (HCC) 11/19/2015  . DM type 2 with diabetic peripheral neuropathy (HCC) 11/19/2015  . Traumatic hemorrhagic shock (HCC)   . Chronic systolic CHF (congestive heart failure) (HCC)   . Uncontrolled type 2 diabetes mellitus with complication (HCC)   . Demand myocardial infarction (HCC) 11/08/2015  . Surgery, elective   . Central line infection, initial encounter   . Septic shock (HCC)  11/03/2015  . Encephalopathy acute 11/03/2015  . Acute blood loss as cause of postoperative anemia 11/03/2015  . Acute respiratory failure with hypoxia (HCC)   . CVA (cerebral vascular accident) (HCC)   . S/P femoral-popliteal bypass surgery   . Malnutrition of moderate degree 11/01/2015  . Diabetic ulcer of left midfoot associated with diabetes mellitus due to underlying condition, with muscle involvement without evidence of necrosis (HCC)   . Hypertensive heart disease with chronic diastolic congestive heart failure (HCC) 10/31/2015  . Osteomyelitis of left foot (HCC) 10/30/2015  . PVD (peripheral vascular disease) with claudication (HCC) 09/28/2015  . Critical lower limb ischemia 08/28/2015  . Hyperlipidemia with target LDL less than 70 06/28/2013  . Diabetes mellitus type II, uncontrolled (HCC) 03/03/2013  . Complicated migraine 03/03/2013  . Stroke-like symptom 03/02/2013  . Diabetes mellitus (HCC) 03/02/2013  . Aphasia 03/02/2013  . Headache(784.0) 03/02/2013  . PAD (peripheral artery disease) (HCC) 06/05/2011  . Coronary artery disease involving native coronary artery of native heart without angina pectoris 06/05/2011  . Hypotension 06/05/2011  . Claudication in peripheral vascular disease:  Lifestyle limiting. 06/05/2011  . Hx of tobacco use, presenting hazards to health 06/05/2011    Past Surgical History:  Procedure Laterality Date  . ABDOMINAL HYSTERECTOMY    . AMPUTATION Right 10/22/2016   Procedure: AMPUTATION DIGIT RIGHT TOES 1-3 POSSIBLE TRANSMETATARSAL;  Surgeon: Chuck Hint, MD;  Location: [facility] OR;  Service: Vascular;  Laterality: Right;  . APPENDECTOMY    . ATHERECTOMY N/A 06/04/2011   Procedure: ATHERECTOMY;  Surgeon: Runell Gess, MD;  Location: [facility] CATH LAB;  Service: Cardiovascular;  Laterality: N/A;  . CARDIAC CATHETERIZATION  10/13/2009   95% stenosis in the AV groove circumflex and 95% ostial stenosis in small OM3. A 3x28mm drug-eluting Promus stent  inserted ito the circumflex. Dilatated with a 3.25x80mm noncompliant Quantum balloon within entire segment. The entire region was reduced to 0% and brisk TIMI3 flow.  . CAROTID DUPLEX  03/19/2011   Right ICA-demonstrates complete occlusion. Left ICA-demonstrates a small amount of fibrous plaque.  Marland Kitchen CATARACT EXTRACTION W/ INTRAOCULAR LENS IMPLANT Right   . CESAREAN SECTION  1990  . CORONARY ANGIOPLASTY    . ENDARTERECTOMY FEMORAL Left 09/05/2015   Procedure: ENDARTERECTOMY FEMORAL WITH PROFUNDOPLASTY;  Surgeon: Chuck Hint, MD;  Location: [facility] OR;  Service: Vascular;  Laterality: Left;  Left common femoral artery vein patch using left saphenous vien  . ENDARTERECTOMY FEMORAL Right 10/22/2016   Procedure: ENDARTERECTOMY RIGHT COMMON FEMORAL;  Surgeon: Chuck Hint, MD;  Location: [facility] OR;  Service: Vascular;  Laterality: Right;  . FEMORAL-POPLITEAL BYPASS GRAFT Left 11/02/2015   Procedure: BYPASS GRAFT FEMORAL-POPLITEAL ARTERY VS FEMORAL-TIBIAL ARTERY BYPASS;  Surgeon: Chuck Hint, MD;  Location: [facility] OR;  Service: Vascular;  Laterality: Left;  . FEMORAL-POPLITEAL BYPASS GRAFT Right 10/22/2016   Procedure: BYPASS GRAFT RIGHT FEMORAL- BELOW KNEE  POPLITEAL ARTERY WITH VEIN;  Surgeon: Chuck Hint, MD;  Location: [facility] OR;  Service: Vascular;  Laterality: Right;  . I & D EXTREMITY Left 11/10/2015   Procedure: Debridement Left Foot Ulcer, Application  Wound VAC;  Surgeon: [name], MD;  Location: [facility] OR;  Service: Orthopedics;  Laterality: Left;  . ILIAC ARTERY STENT Left 08/28/2015   common  . INTRAOPERATIVE ARTERIOGRAM Left 09/05/2015   Procedure: INTRA OPERATIVE ARTERIOGRAM;  Surgeon: Chuck Hint, MD;  Location: [facility] OR;  Service: Vascular;  Laterality: Left;  . INTRAOPERATIVE ARTERIOGRAM Left 11/02/2015   Procedure: INTRA OPERATIVE ARTERIOGRAM;  Surgeon: Chuck Hint, MD;  Location: [facility] OR;  Service: Vascular;  Laterality: Left;  . INTRAOPERATIVE  ARTERIOGRAM Right 10/22/2016   Procedure: INTRA OPERATIVE ARTERIOGRAM;  Surgeon: Chuck Hint, MD;  Location: [facility] OR;  Service: Vascular;  Laterality: Right;  . LEFT HEART CATH AND CORONARY ANGIOGRAPHY N/A 05/11/2018   Procedure: LEFT HEART CATH AND CORONARY ANGIOGRAPHY;  Surgeon: Lennette Bihari, MD;  Location: [facility] INVASIVE CV LAB;  Service: Cardiovascular;  Laterality: N/A;  Eugenie Birks MYOVIEW  10/25/2010   Moderate perfusion defect due to infarct/scar with mild perinfarct ischemia seen in the Basal Inferolateral, Basal Anterolateral, Mid Inferolateral, and Mid Anterolateral regions. Post-stress EF is 50%.  . LOWER EXTREMITY ANGIOGRAPHY N/A 10/17/2016   Procedure: Lower Extremity Angiography;  Surgeon: Runell Gess, MD;  Location: [facility] INVASIVE CV LAB;  Service: Cardiovascular;  Laterality: N/A;  . OVARY SURGERY  1983?   "ruptured"  . PERIPHERAL VASCULAR ANGIOGRAM  01/26/2010   High-grade SFA disease: left greater than right. Left SFA would require fem-pop bypass grafting. Right SFA could be stented but might require Diamondback Orbital atherectomy.  Marland Kitchen PERIPHERAL VASCULAR ANGIOGRAM  02/23/2010   Stealth Predator orbital rotational atherectomy performed on SFA & Popliteal up to 90,000 RPM. Stenting using overlapping 5x167mm and 5x5mm Absolute Pro Nitinol self-expanding stents beginning just at the knee up to the mid SFA resulting in reduction of 90-95% calcified SFA & Popliteal stenosis to 0. Stenting performed on the distal common & proximal iliac artery with a 10x4 Absolute Pro- 70-0%.  Marland Kitchen PERIPHERAL VASCULAR ANGIOGRAM  06/17/2010   PTA performed to the right external iliac artery stent using a 5x100 balloon at 10 atmospheres. Stenting performed using a 6x18 Genesis on Opta balloon. Postdilatation with a 7x2 balloon resulting in a 95% "in-stent" stenosis to 0% residual.  . PERIPHERAL VASCULAR ANGIOGRAM  06/04/2011   Bilateral total SFAs not percutaneously addressable. Good canidate for  femoropopliteal bypass grafting  . PERIPHERAL VASCULAR ANGIOGRAM  08/28/2015  . PERIPHERAL VASCULAR CATHETERIZATION N/A 08/28/2015   Procedure: Lower Extremity Angiography;  Surgeon: Runell Gess, MD;  Location: [facility] INVASIVE CV LAB;  Service: Cardiovascular;  Laterality: N/A;  . PERIPHERAL VASCULAR CATHETERIZATION N/A 08/28/2015   Procedure: Abdominal Aortogram;  Surgeon: Runell Gess, MD;  Location: [facility] INVASIVE CV LAB;  Service: Cardiovascular;  Laterality: N/A;  . PERIPHERAL VASCULAR CATHETERIZATION Left 08/28/2015   Procedure: Peripheral Vascular Intervention;  Surgeon: Runell Gess, MD;  Location: [facility] INVASIVE CV LAB;  Service: Cardiovascular;  Laterality: Left;  common iliac  . PERIPHERAL VASCULAR CATHETERIZATION Left 08/28/2015   Procedure: Peripheral Vascular Atherectomy;  Surgeon: Runell Gess, MD;  Location: [facility] INVASIVE CV LAB;  Service: Cardiovascular;  Laterality: Left;  common iliac  . PERIPHERAL VASCULAR CATHETERIZATION N/A 09/28/2015   Procedure: Lower Extremity Angiography;  Surgeon: Runell Gess, MD;  Location: [facility] INVASIVE CV LAB;  Service: Cardiovascular;  Laterality: N/A;  . PERIPHERAL VASCULAR CATHETERIZATION Left 09/28/2015   Procedure: Peripheral Vascular Intervention;  Surgeon: Lorretta Harp, MD;  Location: [place] CV LAB;  Service: Cardiovascular;  Laterality: Left CFA  PCI with 9 mm x 4 cm Abbott nitinol absolute Pro self-expanding stent     . SKIN SPLIT GRAFT Left 12/01/2015   Procedure: LEFT FOOT SKIN GRAFT AND VAC;  Surgeon: Newt Minion, MD;  Location: [place];  Service: Orthopedics;  Laterality: Left;  . THROMBECTOMY FEMORAL ARTERY Right 10/22/2016   Procedure: THROMBECTOMY FEMORAL ARTERY;  Surgeon: Angelia Mould, MD;  Location: [place];  Service: Vascular;  Laterality: Right;  . TRANSTHORACIC ECHOCARDIOGRAM  10/17/2009   EF 45-50%, moderate hypokinesis of the entire inferolateral myocardium, mild concentric hypertrophy and mild regurg of the  mitral valva.  Marland Kitchen VEIN HARVEST Left 11/02/2015   Procedure: LEFT GREATER SAPHENOUS VEIN HARVEST;  Surgeon: Angelia Mould, MD;  Location: [place];  Service: Vascular;  Laterality: Left;     OB History   No obstetric history on file.     Family History  Problem Relation Age of Onset  . Hypertension Mother   . Heart failure Mother   . Heart failure Father   . Stroke Father   . Diabetes Father     Social History   Tobacco Use  . Smoking status: Former Smoker    Packs/day: 1.50    Years: 41.00    Pack years: 61.50    Types: Cigarettes    Quit date: 10/12/2009    Years since quitting: 9.4  . Smokeless tobacco: Never Used  Substance Use Topics  . Alcohol use: No  . Drug use: No    Home Medications Prior to Admission medications   Medication Sig Start Date End Date Taking? Authorizing Provider  acetaminophen (TYLENOL) 325 MG tablet Take 650 mg by mouth every 6 (six) hours as needed for mild pain, fever or headache.   Yes [provider]  aspirin EC 81 MG tablet Take 81 mg by mouth daily.   Yes [provider]  atorvastatin (LIPITOR) 80 MG tablet Take 80 mg by mouth at bedtime.    Yes [provider]  clopidogrel (PLAVIX) 75 MG tablet Take 75 mg by mouth daily.    Yes [provider]  furosemide (LASIX) 40 MG tablet Take 1 tablet (40 mg total) by mouth 2 (two) times daily. Patient taking differently: Take 40 mg by mouth See admin instructions. Take 40 mg by mouth two times a day FOR 1 WEEK, STARTING ON 03/11/2019, THEN RE-VISIT WITH MD 02/21/18  Yes Mariel Aloe, MD  gabapentin (NEURONTIN) 300 MG capsule Take 1 capsule (300 mg total) by mouth at bedtime. Patient taking differently: Take 300 mg by mouth daily as needed (for nerve pain).  05/13/18  Yes Domenic Polite, MD  isosorbide mononitrate (IMDUR) 60 MG 24 hr tablet Take 1 tablet (60 mg total) by mouth daily. (Take along with 30 mg to equal 90 mg daily) Patient taking differently: Take  60 mg by mouth See admin instructions. Take 60 mg by mouth two times a day FOR 1 WEEK, STARTING ON 03/11/2019, THEN RE-VISIT WITH MD 05/13/18  Yes Domenic Polite, MD  LEVEMIR FLEXTOUCH 100 UNIT/ML Pen Inject 35 Units into the skin at bedtime.  03/15/16  Yes [provider]  metFORMIN (GLUCOPHAGE) 1000 MG tablet Take 1,000 mg by mouth 2 (two) times daily with a meal.   Yes [provider]  metoprolol succinate (TOPROL-XL) 50 MG 24 hr tablet TAKE 1 AND 1/2 TABLETS BY MOUTH DAILY Patient taking differently: Take 75 mg by mouth daily.  04/15/18  Yes Lennette Bihari, MD  nitroGLYCERIN (NITROSTAT) 0.4 MG SL tablet Place 0.4 mg under the tongue every 5 (five) minutes as needed for chest pain.  05/15/18  Yes [provider]  pantoprazole (PROTONIX) 40 MG tablet Take 1 tablet (40 mg total) by mouth daily. 12/21/18  Yes Lennette Bihari, MD  VENTOLIN HFA 108 (90 Base) MCG/ACT inhaler Inhale 1 puff into the lungs every 4 (four) hours as needed for shortness of breath. 03/02/18  Yes [provider]    Allergies    Doxycycline, Hydrochlorothiazide, Latex, and Penicillins  Review of Systems   Review of Systems  Constitutional: Negative.   HENT: Negative.   Respiratory: Positive for shortness of breath. Negative for apnea, cough, choking, chest tightness, wheezing and stridor.   Cardiovascular: Positive for chest pain and leg swelling. Negative for palpitations.  Gastrointestinal: Negative.   Genitourinary: Negative.   Musculoskeletal: Negative.   Skin: Negative.   Neurological: Negative.   All other systems reviewed and are negative.   Physical Exam Updated Vital Signs BP (!) 160/78   Pulse 90   Temp 98.5 F (36.9 C) (Oral)   Resp 17   SpO2 96%   Physical Exam Vitals and nursing note reviewed.  Constitutional:      General: She is not in acute distress.    Appearance: She is well-developed. She is not toxic-appearing.  HENT:     Head: Atraumatic.  Eyes:      Pupils: Pupils are equal, round, and reactive to light.  Cardiovascular:     Rate and Rhythm: Normal rate.     Pulses:          Radial pulses are 2+ on the right side and 2+ on the left side.       Dorsalis pedis pulses are 2+ on the right side and 2+ on the left side.       Posterior tibial pulses are 2+ on the right side and 2+ on the left side.     Heart sounds: Normal heart sounds.  Pulmonary:     Effort: Tachypnea present. No accessory muscle usage, prolonged expiration or respiratory distress.     Breath sounds: Normal breath sounds.     Comments: Mild rales throughout Tachypnea to 30 Chest:     Comments: Old midline surgical scar Abdominal:     General: Bowel sounds are normal. There is no distension.     Palpations: Abdomen is soft.     Tenderness: There is no abdominal tenderness. There is no right CVA tenderness, left CVA tenderness, guarding or rebound.     Comments: Soft, non tender  Musculoskeletal:        General: Normal range of motion.     Cervical back: Normal range of motion.     Right lower leg: No tenderness or bony tenderness. 3+ Pitting Edema present.     Left lower leg: No tenderness or bony tenderness. 3+ Pitting Edema present.     Comments: 2-3+ pitting edema to knees bilaterally.  Toes 1 through 3 surgically removed to right lower extremity. Compartment soft. NO erythema warmth to lower extremities  Skin:    General: Skin is warm and dry.     Capillary Refill: Capillary refill takes less than 2 seconds.     Comments: Venous stasis skin  changes to bilateral lower extremities  Neurological:     Mental Status: She is alert.     ED Results / Procedures / Treatments   Labs (all labs ordered are listed, but only abnormal results are displayed) Labs Reviewed  BASIC METABOLIC PANEL - Abnormal; Notable for the following components:      Result Value   CO2 21 (*)    Glucose, Bld 157 (*)    BUN 35 (*)    Creatinine, Ser 1.34 (*)    GFR calc non Af Amer 41  (*)    GFR calc Af Amer 47 (*)    All other components within normal limits  CBC - Abnormal; Notable for the following components:   RBC 3.60 (*)    Hemoglobin 10.1 (*)    HCT 31.0 (*)    All other components within normal limits  BRAIN NATRIURETIC PEPTIDE - Abnormal; Notable for the following components:   B Natriuretic Peptide 2,460.7 (*)    All other components within normal limits  TROPONIN I (HIGH SENSITIVITY) - Abnormal; Notable for the following components:   Troponin I (High Sensitivity) 123 (*)    All other components within normal limits  TROPONIN I (HIGH SENSITIVITY) - Abnormal; Notable for the following components:   Troponin I (High Sensitivity) 110 (*)    All other components within normal limits  SARS CORONAVIRUS 2 (TAT 6-24 HRS)  HIV ANTIBODY (ROUTINE TESTING W REFLEX)  CREATININE, SERUM  COMPREHENSIVE METABOLIC PANEL  CBC    EKG EKG Interpretation  Date/Time:  Thursday March 11 2019 18:10:09 EST Ventricular Rate:  93 PR Interval:    QRS Duration: 96 QT Interval:  420 QTC Calculation: 522 R Axis:   50 Text Interpretation: Undetermined rhythm Nonspecific ST and T wave abnormality Abnormal ECG Confirmed by Vanetta Mulders [phone]) on 03/11/2019 8:06:59 PM   Radiology DG Chest 2 View  Result Date: 03/11/2019 CLINICAL DATA:  Chest pain EXAM: CHEST - 2 VIEW COMPARISON:  May 08, 2018 FINDINGS: The heart size and mediastinal contours are unchanged with mild prominence. Aortic knob calcifications. There is mildly increased interstitial markings seen throughout both lungs. No large airspace consolidation or pleural effusion. No acute osseous abnormality. Again noted is a loose body around the right shoulder. IMPRESSION: Mildly increased interstitial markings throughout both lungs which could be due to interstitial edema. Electronically Signed   By: Jonna Clark M.D.   On: 03/11/2019 18:53   Procedures .Critical Care Performed by: Linwood Dibbles,  PA-C Authorized by: Linwood Dibbles, PA-C   Critical care provider statement:    Critical care time (minutes):  45   Critical care was necessary to treat or prevent imminent or life-threatening deterioration of the following conditions:  Circulatory failure (Elevated troponin)   Critical care was time spent personally by me on the following activities:  Discussions with consultants, evaluation of patient's response to treatment, examination of patient, ordering and performing treatments and interventions, ordering and review of laboratory studies, ordering and review of radiographic studies, pulse oximetry, re-evaluation of patient's condition, obtaining history from patient or surrogate and review of old charts   (including critical care time)  Medications Ordered in ED Medications  sodium chloride flush (NS) 0.9 % injection 3 mL (has no administration in time range)  heparin injection 5,000 Units (has no administration in time range)  sodium chloride flush (NS) 0.9 % injection 3 mL (has no administration in time range)  sodium chloride flush (NS) 0.9 % injection 3  mL (has no administration in time range)  0.9 %  sodium chloride infusion (has no administration in time range)  acetaminophen (TYLENOL) tablet 650 mg (has no administration in time range)    Or  acetaminophen (TYLENOL) suppository 650 mg (has no administration in time range)  insulin aspart (novoLOG) injection 0-9 Units (has no administration in time range)  furosemide (LASIX) injection 80 mg (80 mg Intravenous Given 03/11/19 2109)   ED Course  I have reviewed the triage vital signs and the nursing notes.  Pertinent labs & imaging results that were available during my care of the patient were reviewed by me and considered in my medical decision making (see chart for details).  69 year old female presents for evaluation of intermittent chest pain 2 days ago and shortness of breath x1 week.  Patient appears obviously fluid  overloaded.  Patient does have significant cardiac risk factors with recent PCI with significant two-vessel disease however patient not felt to be a surgical candidate per CT surgery on prior admission.  2-3+ edema to bilateral lower extremities despite Lasix at home.  Diffuse rales throughout. No CP over last 2 days. No DVT on exam. Labs and imaging obtained from triage.  Chest x-ray with pulmonary edema. CBC without leukocytosis Troponin elevated at 123 delta downtrend to 110  Patient reassessed.  Continues to have some tachypnea.  I attempted to ambulate patient to edge of bed however she became dyspneic requested to stop.  Delta troponin with downtrend.  She has no acute EKG changes.  Question demand from fluid overload vs completed NSTEMI.  Feel she would likely benefit from inpatient admission for diuresis given dyspnea and tachypnea and further workup.  Low suspicion for PE, dissection, or carditis or pericarditis as cause of her symptoms.  CONSULT with Dr.Kim with [facility] who will evaluate patient for admission.  Patient seen and evaluated by attending Dr. Saul Fordyce who agrees with above treatment, plan and disposition.    MDM Rules/Calculators/A&P                      [name] [name] was evaluated in Emergency Department on 03/11/2019 for the symptoms described in the history of present illness. She was evaluated in the context of the global COVID-19 pandemic, which necessitated consideration that the patient might be at risk for infection with the SARS-CoV-2 virus that causes COVID-19. Institutional protocols and algorithms that pertain to the evaluation of patients at risk for COVID-19 are in a state of rapid change based on information released by regulatory bodies including the CDC and federal and state organizations. These policies and algorithms were followed during the patient's care in the ED. Final Clinical Impression(s) / ED Diagnoses Final diagnoses:  Acute on chronic congestive heart  failure, unspecified heart failure type (HCC)  Elevated troponin    Rx / DC Orders ED Discharge Orders    None       Rollyn Scialdone A, PA-C 03/11/19 2302    Vanetta Mulders, MD 03/11/19 2321

## 2019-03-11 NOTE — ED Triage Notes (Signed)
Pt presents to ED w/mid sternum chest pain, seen at her PCP today, they sent her to ED due to elevated cardiac enzymes

## 2019-03-11 NOTE — ED Provider Notes (Signed)
Medical screening examination/treatment/procedure(s) were conducted as a shared visit with non-physician practitioner(s) and myself.  I personally evaluated the patient during the encounter.  EKG Interpretation  Date/Time:  Thursday March 11 2019 18:10:09 EST Ventricular Rate:  93 PR Interval:    QRS Duration: 96 QT Interval:  420 QTC Calculation: 522 R Axis:   50 Text Interpretation: Undetermined rhythm Nonspecific ST and T wave abnormality Abnormal ECG Confirmed by Vanetta Mulders [phone]) on 03/11/2019 8:06:59 PM   Patient seen by me along with the physician assistant.  Patient with known history of congestive heart failure.  Patient's BNP is markedly elevated 2400.  Patient has significant leg edema.  Chest x-ray shows evidence of early pulmonary edema.  I will treat patient with the diuretics.  And contact hospitalist for admission.  Troponins x2 first 1 elevated a little bit it was at 120.  The second 1 was down at 110.  Do not think that there was a significant acute cardiac event.  Patient without fevers.  Slightly tachycardic 102 respiratory rate 18 oxygen sats 100%.  Not in any significant distress.   Vanetta Mulders, MD 03/11/19 2207

## 2019-03-11 NOTE — H&P (Addendum)
TRH H&P    Patient Demographics:    [name] [name], is a 69 y.o. female  MRN: [id number]  DOB - Nov 26, 1950  Admit Date - 03/11/2019  Referring MD/NP/PA:  Evlyn Kanner  Outpatient Primary MD for the patient is Georgann Housekeeper, MD  Patient coming from:  home  Chief complaint- dyspnea and chest pain    HPI:    [name] [name]  is a 69 y.o. female,  w gerd, anemia, hypertension, hyperlipidemia, Dm2, CKD stage3b, PVD, h/o stroke,100% occlusion R ICA,  60% stenosis R subclavian, and 65% stenosis L subclavian artery,  CAD, NSTEMI April 2020, CHF (EF 45%),  Presents with c/o chest pain , last 2 days ago.  Substernal aching, without radiation, which occurred at rest. Pt states relief with nitroglycerin after a few minutes.   Pt notes dyspnea x 1 week. Worse with exertion. Pt has chronic dyspnea.  Pt notes slight dry cough, otherwise, pt denies fever, chills, palp, n/v, abd pain, diarrhea, brbpr, black stool.     Pt was at her PCP and had labs, and apparently troponin elevated and sent to ED for evaluation.   In ED,  T 98.5, P 102, R 18, Bp 119/90 pox 100% on RA  CXR IMPRESSION: Mildly increased interstitial markings throughout both lungs which could be due to interstitial edema.  Na 139, K 5.0, Bun 35, Creatinine 1.34 Wbc 9.2, hgb 10.1, Plt 310 BNP 2,460  (prev 624.8)  Trop 123-> 110 Ekg nsr at 90, nl axis, prolonged qtc, poor R progression, st depression in v4-6 (present 05/11/2018)  Pt was given lasix 80mg  iv x1 in ED, pt apparently ambulated by ED, and extremely dyspneic  ED requests admission for acute on chronic CHF exacerbation and evaluation of chest pain       Review of systems:    In addition to the HPI above,  No Fever-chills, No Headache, No changes with Vision or hearing, No problems swallowing food or Liquids,   No Abdominal pain, No Nausea or Vomiting, bowel movements are regular, No Blood  in stool or Urine, No dysuria, No new skin rashes or bruises, No new joints pains-aches,  No new weakness, tingling, numbness in any extremity, No recent weight gain or loss, No polyuria, polydypsia or polyphagia, No significant Mental Stressors.  All other systems reviewed and are negative.    Past History of the following :    Past Medical History:  Diagnosis Date  . Anemia 10/2015   Acute Blood Loss  . Arthritis    "feel like I have it all over" (08/28/2015)  . CHF (congestive heart failure) (HCC)   . Complication of anesthesia    DIFFICULT WAKING "only when I was smoking; no problems since I quit"  . Coronary artery disease   . Family history of adverse reaction to anesthesia    sister slow to wake up  . GERD (gastroesophageal reflux disease)    takes Protonix daily   . Hip bursitis   . History of blood transfusion    10/2015  . Hyperlipidemia  LDL goal < 70 06/28/2013   takes Atorvastatin daily  . Hypertension    takes Metoprolol and Imdur daily  . Hypoxia 01/2018  . Malnutrition ([place])   . Migraine    "none in a long time" (08/28/2015)  . Myocardial infarction ([place]) 2011  . Neuromuscular disorder ([place])    DIABETIC NEUROPATHY  . Osteomyelitis ([place]) 2017   Left foot  . PAD (peripheral artery disease) ([place])   . Peripheral vascular disease ([place])   . Respiratory failure ([place]) 10/2015   Acute Hypoxia- acute pulmonary edema 11/13/2015  . Septic shock ([place]) 10/2015  . Stroke ([place])   . Type II diabetes mellitus (HCC)    takes Lantus nightly.Average fasting blood sugar runs 80-90  Type II      Past Surgical History:  Procedure Laterality Date  . ABDOMINAL HYSTERECTOMY    . AMPUTATION Right 10/22/2016   Procedure: AMPUTATION DIGIT RIGHT TOES 1-3 POSSIBLE TRANSMETATARSAL;  Surgeon: Angelia Mould, MD;  Location: [place];  Service: Vascular;  Laterality: Right;  . APPENDECTOMY    . ATHERECTOMY N/A 06/04/2011   Procedure: ATHERECTOMY;  Surgeon: Lorretta Harp,  MD;  Location: [facility] CATH LAB;  Service: Cardiovascular;  Laterality: N/A;  . CARDIAC CATHETERIZATION  10/13/2009   95% stenosis in the AV groove circumflex and 95% ostial stenosis in small OM3. A 3x62mm drug-eluting Promus stent inserted ito the circumflex. Dilatated with a 3.25x10mm noncompliant Quantum balloon within entire segment. The entire region was reduced to 0% and brisk TIMI3 flow.  . CAROTID DUPLEX  03/19/2011   Right ICA-demonstrates complete occlusion. Left ICA-demonstrates a small amount of fibrous plaque.  Marland Kitchen CATARACT EXTRACTION W/ INTRAOCULAR LENS IMPLANT Right   . CESAREAN SECTION  1990  . CORONARY ANGIOPLASTY    . ENDARTERECTOMY FEMORAL Left 09/05/2015   Procedure: ENDARTERECTOMY FEMORAL WITH PROFUNDOPLASTY;  Surgeon: Angelia Mould, MD;  Location: [facility] OR;  Service: Vascular;  Laterality: Left;  Left common femoral artery vein patch using left saphenous vien  . ENDARTERECTOMY FEMORAL Right 10/22/2016   Procedure: ENDARTERECTOMY RIGHT COMMON FEMORAL;  Surgeon: Angelia Mould, MD;  Location: [place];  Service: Vascular;  Laterality: Right;  . FEMORAL-POPLITEAL BYPASS GRAFT Left 11/02/2015   Procedure: BYPASS GRAFT FEMORAL-POPLITEAL ARTERY VS FEMORAL-TIBIAL ARTERY BYPASS;  Surgeon: Angelia Mould, MD;  Location: [place];  Service: Vascular;  Laterality: Left;  . FEMORAL-POPLITEAL BYPASS GRAFT Right 10/22/2016   Procedure: BYPASS GRAFT RIGHT FEMORAL- BELOW KNEE POPLITEAL ARTERY WITH VEIN;  Surgeon: Angelia Mould, MD;  Location: [place];  Service: Vascular;  Laterality: Right;  . I & D EXTREMITY Left 11/10/2015   Procedure: Debridement Left Foot Ulcer, Application  Wound VAC;  Surgeon: Newt Minion, MD;  Location: [place];  Service: Orthopedics;  Laterality: Left;  . ILIAC ARTERY STENT Left 08/28/2015   common  . INTRAOPERATIVE ARTERIOGRAM Left 09/05/2015   Procedure: INTRA OPERATIVE ARTERIOGRAM;  Surgeon: Angelia Mould, MD;  Location: [place];  Service: Vascular;   Laterality: Left;  . INTRAOPERATIVE ARTERIOGRAM Left 11/02/2015   Procedure: INTRA OPERATIVE ARTERIOGRAM;  Surgeon: Angelia Mould, MD;  Location: [place];  Service: Vascular;  Laterality: Left;  . INTRAOPERATIVE ARTERIOGRAM Right 10/22/2016   Procedure: INTRA OPERATIVE ARTERIOGRAM;  Surgeon: Angelia Mould, MD;  Location: [place];  Service: Vascular;  Laterality: Right;  . LEFT HEART CATH AND CORONARY ANGIOGRAPHY N/A 05/11/2018   Procedure: LEFT HEART CATH AND CORONARY ANGIOGRAPHY;  Surgeon: Troy Sine, MD;  Location: [facility]  INVASIVE CV LAB;  Service: Cardiovascular;  Laterality: N/A;  Eugenie Birks MYOVIEW  10/25/2010   Moderate perfusion defect due to infarct/scar with mild perinfarct ischemia seen in the Basal Inferolateral, Basal Anterolateral, Mid Inferolateral, and Mid Anterolateral regions. Post-stress EF is 50%.  . LOWER EXTREMITY ANGIOGRAPHY N/A 10/17/2016   Procedure: Lower Extremity Angiography;  Surgeon: Runell Gess, MD;  Location[facility] INVAS[facility] CV LAB;  Service: Cardiovascular;  Laterality: N/A;  . OVARY SURGERY  1983?   "ruptured"  . PERIPHERAL VASCULAR ANGIOGRAM  01/26/2010   High-grade SFA disease: left greater than right. Left SFA would require fem-pop bypass grafting. Right SFA could be stented but might require Diamondback Orbital atherectomy.  Marland Kitchen PERIPHERAL VASCULAR ANGIOGRAM  02/23/2010   Stealth Predator orbital rotational atherectomy performed on SFA & Popliteal up to 90,000 RPM. Stenting using overlapping 5x117mm and 5x55mm Absolute Pro Nitinol self-expanding stents beginning just at the knee up to the mid SFA resulting in reduction of 90-95% calcified SFA & Popliteal stenosis to 0. Stenting performed on the distal common & proximal iliac artery with a 10x4 Absolute Pro- 70-0%.  Marland Kitchen PERIPHERAL VASCULAR ANGIOGRAM  06/17/2010   PTA performed to the right external iliac artery stent using a 5x100 balloon at 10 atmospheres. Stenting performed using a 6x18 Genesis on Opta  balloon. Postdilatation with a 7x2 balloon resulting in a 95% "in-stent" stenosis to 0% residual.  . PERIPHERAL VASCULAR ANGIOGRAM  06/04/2011   Bilateral total SFAs not percutaneously addressable. Good canidate for femoropopliteal bypass grafting  . PERIPHERAL VASCULAR ANGIOGRAM  08/28/2015  . PERIPHERAL VASCULAR CATHETERIZATION N/A 08/28/2015   Procedure: Lower Extremity Angiography;  Surgeon: Runell Gess, MD;  Location: [facility] INVASIVE CV LAB;  Service: Cardiovascular;  Laterality: N/A;  . PERIPHERAL VASCULAR CATHETERIZATION N/A 08/28/2015   Procedure: Abdominal Aortogram;  Surgeon: Runell Gess, MD;  Location: [facility] INVASIVE CV LAB;  Service: Cardiovascular;  Laterality: N/A;  . PERIPHERAL VASCULAR CATHETERIZATION Left 08/28/2015   Procedure: Peripheral Vascular Intervention;  Surgeon: Runell Gess, MD;  Location: [facility] INVASIVE CV LAB;  Service: Cardiovascular;  Laterality: Left;  common iliac  . PERIPHERAL VASCULAR CATHETERIZATION Left 08/28/2015   Procedure: Peripheral Vascular Atherectomy;  Surgeon: Runell Gess, MD;  Location: [facility] INVASIVE CV LAB;  Service: Cardiovascular;  Laterality: Left;  common iliac  . PERIPHERAL VASCULAR CATHETERIZATION N/A 09/28/2015   Procedure: Lower Extremity Angiography;  Surgeon: Runell Gess, MD;  Location: [facility] INVASIVE CV LAB;  Service: Cardiovascular;  Laterality: N/A;  . PERIPHERAL VASCULAR CATHETERIZATION Left 09/28/2015   Procedure: Peripheral Vascular Intervention;  Surgeon: Runell Gess, MD;  Location: [facility] INVASIVE CV LAB;  Service: Cardiovascular;  Laterality: Left CFA  PCI with 9 mm x 4 cm Abbott nitinol absolute Pro self-expanding stent     . SKIN SPLIT GRAFT Left 12/01/2015   Procedure: LEFT FOOT SKIN GRAFT AND VAC;  Surgeon: [name], MD;  Location: [facility] OR;  Service: Orthopedics;  Laterality: Left;  . THROMBECTOMY FEMORAL ARTERY Right 10/22/2016   Procedure: THROMBECTOMY FEMORAL ARTERY;  Surgeon: Chuck Hint, MD;  Location: [facility]  OR;  Service: Vascular;  Laterality: Right;  . TRANSTHORACIC ECHOCARDIOGRAM  10/17/2009   EF 45-50%, moderate hypokinesis of the entire inferolateral myocardium, mild concentric hypertrophy and mild regurg of the mitral valva.  Marland Kitchen VEIN HARVEST Left 11/02/2015   Procedure: LEFT GREATER SAPHENOUS VEIN HARVEST;  Surgeon: Chuck Hint, MD;  Location: [facility] OR;  Service: Vascular;  Laterality: Left;  Social History:      Social History   Tobacco Use  . Smoking status: Former Smoker    Packs/day: 1.50    Years: 41.00    Pack years: 61.50    Types: Cigarettes    Quit date: 10/12/2009    Years since quitting: 9.4  . Smokeless tobacco: Never Used  Substance Use Topics  . Alcohol use: No       Family History :     Family History  Problem Relation Age of Onset  . Hypertension Mother   . Heart failure Mother   . Heart failure Father   . Stroke Father   . Diabetes Father        Home Medications:   Prior to Admission medications   Medication Sig Start Date End Date Taking? Authorizing Provider  acetaminophen (TYLENOL) 325 MG tablet Take 650 mg by mouth every 6 (six) hours as needed for mild pain, fever or headache.    [provider]  aspirin EC 81 MG tablet Take 81 mg by mouth daily.    [provider]  atorvastatin (LIPITOR) 80 MG tablet Take 80 mg by mouth. for hyperlipidemia    [provider]  clopidogrel (PLAVIX) 75 MG tablet Take 75 mg by mouth. for PVD    [provider]  furosemide (LASIX) 40 MG tablet Take 1 tablet (40 mg total) by mouth 2 (two) times daily. 02/21/18   Narda Bonds, MD  gabapentin (NEURONTIN) 300 MG capsule Take 1 capsule (300 mg total) by mouth at bedtime. 05/13/18   Zannie Cove, MD  isosorbide mononitrate (IMDUR) 60 MG 24 hr tablet Take 1 tablet (60 mg total) by mouth daily. (Take along with 30 mg to equal 90 mg daily) Patient taking differently: Take 90 mg by mouth daily. Take 90 mg (1.5 tablets) daily  05/13/18   Zannie Cove, MD  LEVEMIR FLEXTOUCH 100 UNIT/ML Pen Inject 35 Units into the skin at bedtime.  03/15/16   [provider]  metFORMIN (GLUCOPHAGE) 1000 MG tablet Take 1,000 mg by mouth 2 (two) times daily with a meal.    [provider]  metoprolol succinate (TOPROL-XL) 50 MG 24 hr tablet TAKE 1 AND 1/2 TABLETS BY MOUTH DAILY Patient taking differently: Take 75 mg by mouth daily.  04/15/18   Lennette Bihari, MD  nitroGLYCERIN (NITROSTAT) 0.4 MG SL tablet DISSOLVE 1 T UNDER THE TONGUE D PRF CHEST PAIN 05/15/18   [provider]  pantoprazole (PROTONIX) 40 MG tablet Take 1 tablet (40 mg total) by mouth daily. 12/21/18   Lennette Bihari, MD  potassium chloride (K-DUR,KLOR-CON) 10 MEQ tablet Take 10 mEq by mouth daily.  05/31/16   [provider]  VENTOLIN HFA 108 (90 Base) MCG/ACT inhaler Inhale 1 puff into the lungs every 4 (four) hours as needed for shortness of breath. 03/02/18   [provider]     Allergies:     Allergies  Allergen Reactions  . Doxycycline Other (See Comments)    lethargy  . Hydrochlorothiazide Other (See Comments)    lethargic   . Latex Rash  . Penicillins Swelling and Rash    Pt states she has tolerated Keflex in the past without problems. States she may have tolerated Augmentin in the past but it caused GI upset. Has patient had a PCN reaction causing immediate rash, facial/tongue/throat swelling, SOB or lightheadedness with hypotension: Yes Has patient had a PCN reaction causing severe rash involving mucus membranes or skin  necrosis: No Has patient had a PCN reaction that required hospitalization No Has patient had a PCN reaction occurring within the last 10 years: No     Physical Exam:   Vitals  Blood pressure (!) 160/78, pulse 90, temperature 98.5 F (36.9 C), temperature source Oral, resp. rate 17, SpO2 96 %.  1.  General: axoxo3  2. Psychiatric: euthymic  3. Neurologic: Nonfocal, cn2-12 intact,  reflexes 2+ symmetric, diffuse with no clonus, motor 5/5 in all 4 ext  4. HEENMT:  Anicteric, pupils 1.46mm symmetric, direct, consensual intact Neck: no jvd, left carotid bruit  5. Respiratory : Slight crackles right lung base, coarse bs in general, no wheezing  6. Cardiovascular : rrr s1, s2, no m/g/r  7. Gastrointestinal:  Abd: soft, obese, nt, nd, +bs  8. Skin:  Ext: no c/c, trace edema. Dry skin  Over right > left shin  9.Musculoskeletal:  Good ROM     Data Review:    CBC Recent Labs  Lab 03/11/19 1823  WBC 9.2  HGB 10.1*  HCT 31.0*  PLT 310  MCV 86.1  MCH 28.1  MCHC 32.6  RDW 14.5   ------------------------------------------------------------------------------------------------------------------  Results for orders placed or performed during the hospital encounter of 03/11/19 (from the past 48 hour(s))  Basic metabolic panel     Status: Abnormal   Collection Time: 03/11/19  6:23 PM  Result Value Ref Range   Sodium 139 135 - 145 mmol/L   Potassium 5.0 3.5 - 5.1 mmol/L   Chloride 105 98 - 111 mmol/L   CO2 21 (L) 22 - 32 mmol/L   Glucose, Bld 157 (H) 70 - 99 mg/dL   BUN 35 (H) 8 - 23 mg/dL   Creatinine, Ser 2.75 (H) 0.44 - 1.00 mg/dL   Calcium 9.1 8.9 - 17.0 mg/dL   GFR calc non Af Amer 41 (L) >60 mL/min   GFR calc Af Amer 47 (L) >60 mL/min   Anion gap 13 5 - 15    Comment: Performed at [facility], [address] [place], [place] [address]  CBC     Status: Abnormal   Collection Time: 03/11/19  6:23 PM  Result Value Ref Range   WBC 9.2 4.0 - 10.5 K/uL   RBC 3.60 (L) 3.87 - 5.11 MIL/uL   Hemoglobin 10.1 (L) 12.0 - 15.0 g/dL   HCT 44.9 (L) 67.5 - 91.6 %   MCV 86.1 80.0 - 100.0 fL   MCH 28.1 26.0 - 34.0 pg   MCHC 32.6 30.0 - 36.0 g/dL   RDW 38.4 66.5 - 99.3 %   Platelets 310 150 - 400 K/uL   nRBC 0.0 0.0 - 0.2 %    Comment: Performed at [facility], [address] [place], [place] [address]  Troponin I (High Sensitivity)     Status:  Abnormal   Collection Time: 03/11/19  6:23 PM  Result Value Ref Range   Troponin I (High Sensitivity) 123 (HH) <18 ng/L    Comment: CRITICAL RESULT CALLED TO, READ BACK BY AND VERIFIED WITH: B SANGLANG,RN 1932 03/11/2019 WBOND (NOTE) Elevated high sensitivity troponin I (hsTnI) values and significant  changes across serial measurements may suggest ACS but many other  chronic and acute conditions are known to elevate hsTnI results.  Refer to the Links section for chest pain algorithms and additional  guidance. Performed at [facility], [address], [place], [place] [address]   Brain natriuretic peptide  Status: Abnormal   Collection Time: 03/11/19  6:23 PM  Result Value Ref Range   B Natriuretic Peptide 2,460.7 (H) 0.0 - 100.0 pg/mL    Comment: Performed at [facility], [address], [place], [place] [address]  Troponin I (High Sensitivity)     Status: Abnormal   Collection Time: 03/11/19  8:30 PM  Result Value Ref Range   Troponin I (High Sensitivity) 110 (HH) <18 ng/L    Comment: CRITICAL VALUE NOTED.  VALUE IS CONSISTENT WITH PREVIOUSLY REPORTED AND CALLED VALUE. Performed at [facility], [address], [place], [place] [address]     Chemistries  Recent Labs  Lab 03/11/19 1823  NA 139  K 5.0  CL 105  CO2 21*  GLUCOSE 157*  BUN 35*  CREATININE 1.34*  CALCIUM 9.1   ------------------------------------------------------------------------------------------------------------------  ------------------------------------------------------------------------------------------------------------------ GFR: CrCl cannot be calculated (Unknown ideal weight.). Liver Function Tests: No results for input(s): AST, ALT, ALKPHOS, BILITOT, PROT, ALBUMIN in the last 168 hours. No results for input(s): LIPASE, AMYLASE in the last 168 hours. No results for input(s): AMMONIA in the last 168 hours. Coagulation Profile: No results for input(s): INR, PROTIME in  the last 168 hours. Cardiac Enzymes: No results for input(s): CKTOTAL, CKMB, CKMBINDEX, TROPONINI in the last 168 hours. BNP (last 3 results) No results for input(s): PROBNP in the last 8760 hours. HbA1C: No results for input(s): HGBA1C in the last 72 hours. CBG: No results for input(s): GLUCAP in the last 168 hours. Lipid Profile: No results for input(s): CHOL, HDL, LDLCALC, TRIG, CHOLHDL, LDLDIRECT in the last 72 hours. Thyroid Function Tests: No results for input(s): TSH, T4TOTAL, FREET4, T3FREE, THYROIDAB in the last 72 hours. Anemia Panel: No results for input(s): VITAMINB12, FOLATE, FERRITIN, TIBC, IRON, RETICCTPCT in the last 72 hours.  --------------------------------------------------------------------------------------------------------------- Urine analysis:    Component Value Date/Time   COLORURINE YELLOW 09/05/2015 0641   APPEARANCEUR CLEAR 09/05/2015 0641   LABSPEC 1.013 09/05/2015 0641   PHURINE 6.0 09/05/2015 0641   GLUCOSEU 100 (A) 09/05/2015 0641   HGBUR TRACE (A) 09/05/2015 0641   BILIRUBINUR NEGATIVE 09/05/2015 0641   KETONESUR NEGATIVE 09/05/2015 0641   PROTEINUR 100 (A) 09/05/2015 0641   UROBILINOGEN 1.0 10/16/2009 0243   NITRITE NEGATIVE 09/05/2015 0641   LEUKOCYTESUR NEGATIVE 09/05/2015 0641      Imaging Results:    DG Chest 2 View  Result Date: 03/11/2019 CLINICAL DATA:  Chest pain EXAM: CHEST - 2 VIEW COMPARISON:  May 08, 2018 FINDINGS: The heart size and mediastinal contours are unchanged with mild prominence. Aortic knob calcifications. There is mildly increased interstitial markings seen throughout both lungs. No large airspace consolidation or pleural effusion. No acute osseous abnormality. Again noted is a loose body around the right shoulder. IMPRESSION: Mildly increased interstitial markings throughout both lungs which could be due to interstitial edema. Electronically Signed   By: Jonna Clark M.D.   On: 03/11/2019 18:53       Assessment  & Plan:    Principal Problem:   Acute on chronic combined systolic and diastolic CHF (congestive heart failure) (HCC) Active Problems:   Uncontrolled type 2 diabetes mellitus with complication (HCC)   Dyspnea   Elevated troponin   Chest pain  Dyspnea Acute on Chronic Systolic/ Diastolic CHF Tele Trop I  Check cardiac echo Cont Troprol XL  po qday Lasix  iv bid  Chest pain, Elevated troponin, h/o CAD w NSTEMI 04/2018 Check lipid Cont aspirin Cont Plavix  po qday  Increase Imdur 60mg  po qday-> 120mg  po qday  Cont Toprol XL as above Cont Lipitor 80mg  po qhs Cardiology consult requested in computer email sent to [facility]  Hypertension uncontrolled Hydralazine 10mg  iv q6h prn sbp >160  Dm2 Cont Metformin 1000mg  po bid fsbs q4h, ISS  PVD Consider Xarelto 2.5mg  po bid   Anemia (chronic) Check cbc in am  Gerd Cont PPI   DVT Prophylaxis-   Heparin   SCDs   AM Labs Ordered, also please review Full Orders  Family Communication: Admission, patients condition and plan of care including tests being ordered have been discussed with the patient  who indicate understanding and agree with the plan and Code Status.  Code Status:  FULL CODE per patient, notified daughter that patient admitted to [facility]  Admission status: Observation: Based on patients clinical presentation and evaluation of above clinical data, I have made determination that patient meets observation criteria at this time.     Time spent in minutes : 70 minutes   Pearson Grippe M.D on 03/11/2019 at 10:00 PM

## 2019-03-11 NOTE — ED Provider Notes (Signed)
In addition to my note.  Patient with complaint of midsternal chest pain sent here by her PCP today pain.  I sent her for elevated cardiac enzyme.   Vanetta Mulders, MD 03/11/19 2208

## 2019-03-12 ENCOUNTER — Ambulatory Visit (HOSPITAL_BASED_OUTPATIENT_CLINIC_OR_DEPARTMENT_OTHER): Payer: Medicare Other

## 2019-03-12 DIAGNOSIS — Z833 Family history of diabetes mellitus: Secondary | ICD-10-CM | POA: Diagnosis not present

## 2019-03-12 DIAGNOSIS — Z961 Presence of intraocular lens: Secondary | ICD-10-CM | POA: Diagnosis present

## 2019-03-12 DIAGNOSIS — Z20822 Contact with and (suspected) exposure to covid-19: Secondary | ICD-10-CM | POA: Diagnosis present

## 2019-03-12 DIAGNOSIS — E1140 Type 2 diabetes mellitus with diabetic neuropathy, unspecified: Secondary | ICD-10-CM | POA: Diagnosis present

## 2019-03-12 DIAGNOSIS — I5043 Acute on chronic combined systolic (congestive) and diastolic (congestive) heart failure: Secondary | ICD-10-CM | POA: Diagnosis present

## 2019-03-12 DIAGNOSIS — E1165 Type 2 diabetes mellitus with hyperglycemia: Secondary | ICD-10-CM | POA: Diagnosis present

## 2019-03-12 DIAGNOSIS — E785 Hyperlipidemia, unspecified: Secondary | ICD-10-CM | POA: Diagnosis present

## 2019-03-12 DIAGNOSIS — I25110 Atherosclerotic heart disease of native coronary artery with unstable angina pectoris: Secondary | ICD-10-CM | POA: Diagnosis not present

## 2019-03-12 DIAGNOSIS — I2510 Atherosclerotic heart disease of native coronary artery without angina pectoris: Secondary | ICD-10-CM | POA: Diagnosis present

## 2019-03-12 DIAGNOSIS — Z823 Family history of stroke: Secondary | ICD-10-CM | POA: Diagnosis not present

## 2019-03-12 DIAGNOSIS — N1832 Chronic kidney disease, stage 3b: Secondary | ICD-10-CM | POA: Diagnosis present

## 2019-03-12 DIAGNOSIS — I25118 Atherosclerotic heart disease of native coronary artery with other forms of angina pectoris: Secondary | ICD-10-CM

## 2019-03-12 DIAGNOSIS — I252 Old myocardial infarction: Secondary | ICD-10-CM | POA: Diagnosis not present

## 2019-03-12 DIAGNOSIS — Z8249 Family history of ischemic heart disease and other diseases of the circulatory system: Secondary | ICD-10-CM | POA: Diagnosis not present

## 2019-03-12 DIAGNOSIS — Z89421 Acquired absence of other right toe(s): Secondary | ICD-10-CM | POA: Diagnosis not present

## 2019-03-12 DIAGNOSIS — R0600 Dyspnea, unspecified: Secondary | ICD-10-CM | POA: Diagnosis not present

## 2019-03-12 DIAGNOSIS — E118 Type 2 diabetes mellitus with unspecified complications: Secondary | ICD-10-CM | POA: Diagnosis not present

## 2019-03-12 DIAGNOSIS — K219 Gastro-esophageal reflux disease without esophagitis: Secondary | ICD-10-CM | POA: Diagnosis present

## 2019-03-12 DIAGNOSIS — I248 Other forms of acute ischemic heart disease: Secondary | ICD-10-CM | POA: Diagnosis not present

## 2019-03-12 DIAGNOSIS — Z9841 Cataract extraction status, right eye: Secondary | ICD-10-CM | POA: Diagnosis not present

## 2019-03-12 DIAGNOSIS — Z79899 Other long term (current) drug therapy: Secondary | ICD-10-CM | POA: Diagnosis not present

## 2019-03-12 DIAGNOSIS — Z87891 Personal history of nicotine dependence: Secondary | ICD-10-CM | POA: Diagnosis not present

## 2019-03-12 DIAGNOSIS — D631 Anemia in chronic kidney disease: Secondary | ICD-10-CM | POA: Diagnosis present

## 2019-03-12 DIAGNOSIS — R079 Chest pain, unspecified: Secondary | ICD-10-CM | POA: Diagnosis present

## 2019-03-12 DIAGNOSIS — E1151 Type 2 diabetes mellitus with diabetic peripheral angiopathy without gangrene: Secondary | ICD-10-CM | POA: Diagnosis present

## 2019-03-12 DIAGNOSIS — M1990 Unspecified osteoarthritis, unspecified site: Secondary | ICD-10-CM | POA: Diagnosis present

## 2019-03-12 DIAGNOSIS — Z951 Presence of aortocoronary bypass graft: Secondary | ICD-10-CM | POA: Diagnosis not present

## 2019-03-12 DIAGNOSIS — Z8673 Personal history of transient ischemic attack (TIA), and cerebral infarction without residual deficits: Secondary | ICD-10-CM | POA: Diagnosis not present

## 2019-03-12 DIAGNOSIS — I130 Hypertensive heart and chronic kidney disease with heart failure and stage 1 through stage 4 chronic kidney disease, or unspecified chronic kidney disease: Secondary | ICD-10-CM | POA: Diagnosis present

## 2019-03-12 LAB — COMPREHENSIVE METABOLIC PANEL
ALT: 24 U/L (ref 0–44)
AST: 24 U/L (ref 15–41)
Albumin: 3.4 g/dL — ABNORMAL LOW (ref 3.5–5.0)
Alkaline Phosphatase: 97 U/L (ref 38–126)
Anion gap: 12 (ref 5–15)
BUN: 33 mg/dL — ABNORMAL HIGH (ref 8–23)
CO2: 23 mmol/L (ref 22–32)
Calcium: 9 mg/dL (ref 8.9–10.3)
Chloride: 105 mmol/L (ref 98–111)
Creatinine, Ser: 1.16 mg/dL — ABNORMAL HIGH (ref 0.44–1.00)
GFR calc Af Amer: 56 mL/min — ABNORMAL LOW (ref >60)
GFR calc non Af Amer: 48 mL/min — ABNORMAL LOW (ref >60)
Glucose, Bld: 129 mg/dL — ABNORMAL HIGH (ref 70–99)
Potassium: 4.1 mmol/L (ref 3.5–5.1)
Sodium: 140 mmol/L (ref 135–145)
Total Bilirubin: 0.8 mg/dL (ref 0.3–1.2)
Total Protein: 6.7 g/dL (ref 6.5–8.1)

## 2019-03-12 LAB — CBG MONITORING, ED: Glucose-Capillary: 122 mg/dL — ABNORMAL HIGH (ref 70–99)

## 2019-03-12 LAB — CBC
HCT: 29 % — ABNORMAL LOW (ref 36.0–46.0)
Hemoglobin: 9.3 g/dL — ABNORMAL LOW (ref 12.0–15.0)
MCH: 27.4 pg (ref 26.0–34.0)
MCHC: 32.1 g/dL (ref 30.0–36.0)
MCV: 85.5 fL (ref 80.0–100.0)
Platelets: 288 10*3/uL (ref 150–400)
RBC: 3.39 MIL/uL — ABNORMAL LOW (ref 3.87–5.11)
RDW: 14.6 % (ref 11.5–15.5)
WBC: 9.7 10*3/uL (ref 4.0–10.5)
nRBC: 0 % (ref 0.0–0.2)

## 2019-03-12 LAB — GLUCOSE, CAPILLARY
Glucose-Capillary: 140 mg/dL — ABNORMAL HIGH (ref 70–99)
Glucose-Capillary: 129 mg/dL — ABNORMAL HIGH (ref 70–99)
Glucose-Capillary: 171 mg/dL — ABNORMAL HIGH (ref 70–99)
Glucose-Capillary: 196 mg/dL — ABNORMAL HIGH (ref 70–99)
Glucose-Capillary: 155 mg/dL — ABNORMAL HIGH (ref 70–99)

## 2019-03-12 LAB — ECHOCARDIOGRAM COMPLETE
Height: 64 in
Weight: 2659.63 oz

## 2019-03-12 LAB — LIPID PANEL
Cholesterol: 162 mg/dL (ref 0–200)
HDL: 26 mg/dL — ABNORMAL LOW (ref >40)
LDL Cholesterol: 101 mg/dL — ABNORMAL HIGH (ref 0–99)
Total CHOL/HDL Ratio: 6.2 RATIO
Triglycerides: 176 mg/dL — ABNORMAL HIGH (ref <150)
VLDL: 35 mg/dL (ref 0–40)

## 2019-03-12 LAB — HIV ANTIBODY (ROUTINE TESTING W REFLEX): HIV Screen 4th Generation wRfx: NONREACTIVE

## 2019-03-12 LAB — CREATININE, SERUM
Creatinine, Ser: 1.31 mg/dL — ABNORMAL HIGH (ref 0.44–1.00)
GFR calc Af Amer: 48 mL/min — ABNORMAL LOW (ref >60)
GFR calc non Af Amer: 42 mL/min — ABNORMAL LOW (ref >60)

## 2019-03-12 LAB — MAGNESIUM: Magnesium: 1.6 mg/dL — ABNORMAL LOW (ref 1.7–2.4)

## 2019-03-12 LAB — SARS CORONAVIRUS 2 (TAT 6-24 HRS): SARS Coronavirus 2: NEGATIVE

## 2019-03-12 LAB — TROPONIN I (HIGH SENSITIVITY): Troponin I (High Sensitivity): 115 ng/L (ref <18)

## 2019-03-12 MED ORDER — HYDROCODONE-ACETAMINOPHEN 5-325 MG PO TABS
1.0000 | ORAL_TABLET | Freq: Four times a day (QID) | ORAL | Status: DC | PRN
  Administered 2019-03-12 – 2019-03-14 (×2): 1 via ORAL
  Filled 2019-03-12 (×2): qty 1

## 2019-03-12 MED ORDER — FUROSEMIDE 10 MG/ML IJ SOLN
40.0000 mg | Freq: Two times a day (BID) | INTRAMUSCULAR | Status: DC
  Administered 2019-03-12: 40 mg via INTRAVENOUS
  Filled 2019-03-12: qty 4

## 2019-03-12 MED ORDER — GABAPENTIN 300 MG PO CAPS: 300.0000 mg | ORAL_CAPSULE | Freq: Every day | ORAL | Status: DC | PRN

## 2019-03-12 MED ORDER — FUROSEMIDE 10 MG/ML IJ SOLN: 40.0000 mg | Freq: Every day | INTRAMUSCULAR | Status: DC

## 2019-03-12 MED ORDER — ASPIRIN EC 81 MG PO TBEC
81.0000 mg | DELAYED_RELEASE_TABLET | Freq: Every day | ORAL | Status: DC
  Administered 2019-03-12 – 2019-03-15 (×4): 81 mg via ORAL
  Filled 2019-03-12 (×4): qty 1

## 2019-03-12 MED ORDER — ISOSORBIDE MONONITRATE ER 60 MG PO TB24
120.0000 mg | ORAL_TABLET | Freq: Every day | ORAL | Status: DC
  Administered 2019-03-13 – 2019-03-15 (×3): 120 mg via ORAL
  Filled 2019-03-12 (×4): qty 2

## 2019-03-12 MED ORDER — METFORMIN HCL 500 MG PO TABS
1000.0000 mg | ORAL_TABLET | Freq: Two times a day (BID) | ORAL | Status: DC
  Administered 2019-03-12: 1000 mg via ORAL
  Filled 2019-03-12: qty 2

## 2019-03-12 MED ORDER — MAGNESIUM SULFATE 2 GM/50ML IV SOLN
2.0000 g | Freq: Once | INTRAVENOUS | Status: AC
  Administered 2019-03-12: 2 g via INTRAVENOUS
  Filled 2019-03-12: qty 50

## 2019-03-12 MED ORDER — ATORVASTATIN CALCIUM 80 MG PO TABS
80.0000 mg | ORAL_TABLET | Freq: Every day | ORAL | Status: DC
  Administered 2019-03-12 – 2019-03-14 (×3): 80 mg via ORAL
  Filled 2019-03-12 (×3): qty 1

## 2019-03-12 MED ORDER — NITROGLYCERIN 0.4 MG SL SUBL: 0.4000 mg | SUBLINGUAL_TABLET | SUBLINGUAL | Status: DC | PRN

## 2019-03-12 MED ORDER — ALBUTEROL SULFATE (2.5 MG/3ML) 0.083% IN NEBU
3.0000 mL | INHALATION_SOLUTION | RESPIRATORY_TRACT | Status: DC | PRN
  Administered 2019-03-12 – 2019-03-13 (×2): 3 mL via RESPIRATORY_TRACT
  Filled 2019-03-12 (×2): qty 3

## 2019-03-12 MED ORDER — HYDRALAZINE HCL 20 MG/ML IJ SOLN
10.0000 mg | Freq: Four times a day (QID) | INTRAMUSCULAR | Status: DC | PRN
  Administered 2019-03-12: 10 mg via INTRAVENOUS
  Filled 2019-03-12: qty 1

## 2019-03-12 MED ORDER — METOPROLOL SUCCINATE ER 25 MG PO TB24
75.0000 mg | ORAL_TABLET | Freq: Every day | ORAL | Status: DC
  Administered 2019-03-13 – 2019-03-15 (×3): 75 mg via ORAL
  Filled 2019-03-12 (×4): qty 3

## 2019-03-12 MED ORDER — PANTOPRAZOLE SODIUM 40 MG PO TBEC
40.0000 mg | DELAYED_RELEASE_TABLET | Freq: Every day | ORAL | Status: DC
  Administered 2019-03-12 – 2019-03-15 (×4): 40 mg via ORAL
  Filled 2019-03-12 (×4): qty 1

## 2019-03-12 MED ORDER — FUROSEMIDE 10 MG/ML IJ SOLN
40.0000 mg | Freq: Two times a day (BID) | INTRAMUSCULAR | Status: DC
  Administered 2019-03-12 – 2019-03-14 (×4): 40 mg via INTRAVENOUS
  Filled 2019-03-12 (×4): qty 4

## 2019-03-12 MED ORDER — CLOPIDOGREL BISULFATE 75 MG PO TABS
75.0000 mg | ORAL_TABLET | Freq: Every day | ORAL | Status: DC
  Administered 2019-03-12 – 2019-03-15 (×4): 75 mg via ORAL
  Filled 2019-03-12 (×4): qty 1

## 2019-03-12 NOTE — ED Notes (Signed)
Pt's linens changed and lights dimmed for comfort

## 2019-03-12 NOTE — ED Notes (Signed)
Pt given warm blankets, room temp increased for comfort.

## 2019-03-12 NOTE — Plan of Care (Signed)

## 2019-03-12 NOTE — ED Notes (Signed)
Breakfast Ordered 

## 2019-03-12 NOTE — Progress Notes (Signed)
Called to room by patient c/o severe leg pain, 10/10, states she is also having difficulty breathing.  Her RR is 28-30 at this time, oxygen saturation is 90-100 % on room air, VS in chart. Dr. Benjamine Mola notified regarding change in condition.  Lungs with bilateral wheezes and course crackles at this time.  Advised by Dr. Benjamine Mola to give 1800 Lasix dose now, and prn Norco. Patient medicated.  She appears to be calmer at this time after interventions.  Will continue to monitor.  Notified  Dr. Benjamine Mola of Magnesium level of 1.6 today.

## 2019-03-12 NOTE — Progress Notes (Signed)
  Echocardiogram 2D Echocardiogram has been performed.  [name] [name] 03/12/2019, 11:17 AM

## 2019-03-12 NOTE — ED Notes (Signed)
Pt's SBP noted to be >160. Will retake in to determine need for PRN hydralazine

## 2019-03-12 NOTE — Consult Note (Addendum)
Cardiology Consultation:   Patient ID: [name] [name] MRN: [id number]; DOB: 09-08-1950  Admit date: 03/11/2019 Date of Consult: 03/12/2019  Primary Care Provider: Georgann Housekeeper, MD Primary Cardiologist: Nicki Guadalajara, MD  Primary Electrophysiologist:  None    Patient Profile:   [name] [name] is a 69 y.o. female with a hx of CAD s/p DES left Cx, recent NSTEMI in April 2020 cath showing 2V disease (treated with medical therapy), PVD, combined systolic and diastolic heart failure (EF 45% 04/2018), DM2, HTN, HLD, CKD, former smoker, and prior stroke who is being seen today for the evaluation of chest pain at the request of Dr. Benjamine Mola.  History of Present Illness:   [name] [name] is followed by Dr. Tresa Endo. She had a STEMI in 2011 and underwent intervention to the totally occluded left Cx artery with DES. There was diffuse disease in her left Cx beyond the site of total occlusion and later underwent PTCA of the smaller vessels. She has angioplasty and stenting with diamondback rotational arthrectomy of right popliteal and SFA in January 2012 and required repeat intervention in 2013. She ws hospitalized in October 2017 and underwent left femoral to below the knee popliteal artery bypass. She had documented carotid stenosis with total right ICA occlusion. Echo in Jan 2020 showed EF 45%, diffuse hypokinesis, G2DD. Patient was admitted in early April 2020 with SOB that woke her up from sleep. She was admitted and underwent IV diuresis. Echo showed 04/2018 showed EF 45%, moderate mitral annular valve calcification without significant disease. Cardiac cath 05/11/2018 showed 85% ostial left Cx disease, 80% mid left main disease, 50% mid left cx disease, 30% prox LAD disease. RCA was normal. Cardiothoracic surgery was consulted fur to 2V disease and the patient was evaluated by Dr. Laneta Simmers who felt the patient was a high risk candidate and medical therapy was recommended. The patient was seen in follow up Televisit 05/26/19 and  was not having chest pain. Weight was stable as well.   The patient presented to the ED 03/11/19 for chest pain and shortness of breath. 3 days ago she had a 30 minute episode of substernal chest pain while at rest described as a pressure. She took NTG which resolved the pain. She had another event the following day also at rest. The second event was worse, 8/10 and radiated into her arms. It lasted about 20 minutes and NTG also relieved the pain. Also complained of worsening shortness of breath on exertion and lower leg edema for the last week despite taking her daily lasix 40 mg. She saw her PCP who did labs and recommended that she be evaluated in the ED. Last week she reported eating lots of salt with her meals.   In the ED 160/78, pulse 90, afebrile, RR 17, 96% O2. Pitting edema noted on exam. Labs showed potassium 5.0, glucose 157, creatinine 1.34. HS troponin 123 > 110. BNP 2,460. WBC 9.2, Hgb 10.1. COVID negative. EKG with diffuse minimal ST depressions but does not seem to unchanged from prior. Mildly increased interstitial markings which could be interstitial edema. She was given IV lasix 80 mg   Heart Pathway Score:  HEAR Score: 7  Past Medical History:  Diagnosis Date  . Anemia 10/2015   Acute Blood Loss  . Arthritis    "feel like I have it all over" (08/28/2015)  . CHF (congestive heart failure) (HCC)   . Complication of anesthesia    DIFFICULT WAKING "only when I was smoking; no problems since I quit"  .  Coronary artery disease   . Family history of adverse reaction to anesthesia    sister slow to wake up  . GERD (gastroesophageal reflux disease)    takes Protonix daily   . Hip bursitis   . History of blood transfusion    10/2015  . Hyperlipidemia LDL goal < 70 06/28/2013   takes Atorvastatin daily  . Hypertension    takes Metoprolol and Imdur daily  . Hypoxia 01/2018  . Malnutrition (HCC)   . Migraine    "none in a long time" (08/28/2015)  . Myocardial infarction (HCC)  2011  . Neuromuscular disorder (HCC)    DIABETIC NEUROPATHY  . Osteomyelitis (HCC) 2017   Left foot  . PAD (peripheral artery disease) (HCC)   . Peripheral vascular disease (HCC)   . Respiratory failure (HCC) 10/2015   Acute Hypoxia- acute pulmonary edema 11/13/2015  . Septic shock (HCC) 10/2015  . Stroke (HCC)   . Type II diabetes mellitus (HCC)    takes Lantus nightly.Average fasting blood sugar runs 80-90  Type II    Past Surgical History:  Procedure Laterality Date  . ABDOMINAL HYSTERECTOMY    . AMPUTATION Right 10/22/2016   Procedure: AMPUTATION DIGIT RIGHT TOES 1-3 POSSIBLE TRANSMETATARSAL;  Surgeon: Chuck Hint, MD;  Location: [facility] OR;  Service: Vascular;  Laterality: Right;  . APPENDECTOMY    . ATHERECTOMY N/A 06/04/2011   Procedure: ATHERECTOMY;  Surgeon: Runell Gess, MD;  Location: [facility] CATH LAB;  Service: Cardiovascular;  Laterality: N/A;  . CARDIAC CATHETERIZATION  10/13/2009   95% stenosis in the AV groove circumflex and 95% ostial stenosis in small OM3. A 3x26mm drug-eluting Promus stent inserted ito the circumflex. Dilatated with a 3.25x7mm noncompliant Quantum balloon within entire segment. The entire region was reduced to 0% and brisk TIMI3 flow.  . CAROTID DUPLEX  03/19/2011   Right ICA-demonstrates complete occlusion. Left ICA-demonstrates a small amount of fibrous plaque.  Marland Kitchen CATARACT EXTRACTION W/ INTRAOCULAR LENS IMPLANT Right   . CESAREAN SECTION  1990  . CORONARY ANGIOPLASTY    . ENDARTERECTOMY FEMORAL Left 09/05/2015   Procedure: ENDARTERECTOMY FEMORAL WITH PROFUNDOPLASTY;  Surgeon: Chuck Hint, MD;  Location: [facility] OR;  Service: Vascular;  Laterality: Left;  Left common femoral artery vein patch using left saphenous vien  . ENDARTERECTOMY FEMORAL Right 10/22/2016   Procedure: ENDARTERECTOMY RIGHT COMMON FEMORAL;  Surgeon: Chuck Hint, MD;  Location: [facility] OR;  Service: Vascular;  Laterality: Right;  . FEMORAL-POPLITEAL BYPASS GRAFT Left  11/02/2015   Procedure: BYPASS GRAFT FEMORAL-POPLITEAL ARTERY VS FEMORAL-TIBIAL ARTERY BYPASS;  Surgeon: Chuck Hint, MD;  Location: [facility] OR;  Service: Vascular;  Laterality: Left;  . FEMORAL-POPLITEAL BYPASS GRAFT Right 10/22/2016   Procedure: BYPASS GRAFT RIGHT FEMORAL- BELOW KNEE POPLITEAL ARTERY WITH VEIN;  Surgeon: Chuck Hint, MD;  Location: [facility] OR;  Service: Vascular;  Laterality: Right;  . I & D EXTREMITY Left 11/10/2015   Procedure: Debridement Left Foot Ulcer, Application  Wound VAC;  Surgeon: [name], MD;  Location: [facility] OR;  Service: Orthopedics;  Laterality: Left;  . ILIAC ARTERY STENT Left 08/28/2015   common  . INTRAOPERATIVE ARTERIOGRAM Left 09/05/2015   Procedure: INTRA OPERATIVE ARTERIOGRAM;  Surgeon: Chuck Hint, MD;  Location: [facility] OR;  Service: Vascular;  Laterality: Left;  . INTRAOPERATIVE ARTERIOGRAM Left 11/02/2015   Procedure: INTRA OPERATIVE ARTERIOGRAM;  Surgeon: Chuck Hint, MD;  Location: [facility] OR;  Service: Vascular;  Laterality: Left;  . INTRAOPERATIVE ARTERIOGRAM Right 10/22/2016  Procedure: INTRA OPERATIVE ARTERIOGRAM;  Surgeon: Chuck Hint, MD;  Location: [facility] OR;  Service: Vascular;  Laterality: Right;  . LEFT HEART CATH AND CORONARY ANGIOGRAPHY N/A 05/11/2018   Procedure: LEFT HEART CATH AND CORONARY ANGIOGRAPHY;  Surgeon: Lennette Bihari, MD;  Location: [facility] INVASIVE CV LAB;  Service: Cardiovascular;  Laterality: N/A;  Eugenie Birks MYOVIEW  10/25/2010   Moderate perfusion defect due to infarct/scar with mild perinfarct ischemia seen in the Basal Inferolateral, Basal Anterolateral, Mid Inferolateral, and Mid Anterolateral regions. Post-stress EF is 50%.  . LOWER EXTREMITY ANGIOGRAPHY N/A 10/17/2016   Procedure: Lower Extremity Angiography;  Surgeon: Runell Gess, MD;  Location: [facility] INVASIVE CV LAB;  Service: Cardiovascular;  Laterality: N/A;  . OVARY SURGERY  1983?   "ruptured"  . PERIPHERAL VASCULAR ANGIOGRAM  01/26/2010    High-grade SFA disease: left greater than right. Left SFA would require fem-pop bypass grafting. Right SFA could be stented but might require Diamondback Orbital atherectomy.  Marland Kitchen PERIPHERAL VASCULAR ANGIOGRAM  02/23/2010   Stealth Predator orbital rotational atherectomy performed on SFA & Popliteal up to 90,000 RPM. Stenting using overlapping 5x110mm and 5x46mm Absolute Pro Nitinol self-expanding stents beginning just at the knee up to the mid SFA resulting in reduction of 90-95% calcified SFA & Popliteal stenosis to 0. Stenting performed on the distal common & proximal iliac artery with a 10x4 Absolute Pro- 70-0%.  Marland Kitchen PERIPHERAL VASCULAR ANGIOGRAM  06/17/2010   PTA performed to the right external iliac artery stent using a 5x100 balloon at 10 atmospheres. Stenting performed using a 6x18 Genesis on Opta balloon. Postdilatation with a 7x2 balloon resulting in a 95% "in-stent" stenosis to 0% residual.  . PERIPHERAL VASCULAR ANGIOGRAM  06/04/2011   Bilateral total SFAs not percutaneously addressable. Good canidate for femoropopliteal bypass grafting  . PERIPHERAL VASCULAR ANGIOGRAM  08/28/2015  . PERIPHERAL VASCULAR CATHETERIZATION N/A 08/28/2015   Procedure: Lower Extremity Angiography;  Surgeon: Runell Gess, MD;  Location: [facility] INVASIVE CV LAB;  Service: Cardiovascular;  Laterality: N/A;  . PERIPHERAL VASCULAR CATHETERIZATION N/A 08/28/2015   Procedure: Abdominal Aortogram;  Surgeon: Runell Gess, MD;  Location: [facility] INVASIVE CV LAB;  Service: Cardiovascular;  Laterality: N/A;  . PERIPHERAL VASCULAR CATHETERIZATION Left 08/28/2015   Procedure: Peripheral Vascular Intervention;  Surgeon: Runell Gess, MD;  Location: [facility] INVASIVE CV LAB;  Service: Cardiovascular;  Laterality: Left;  common iliac  . PERIPHERAL VASCULAR CATHETERIZATION Left 08/28/2015   Procedure: Peripheral Vascular Atherectomy;  Surgeon: Runell Gess, MD;  Location: [facility] INVASIVE CV LAB;  Service: Cardiovascular;  Laterality: Left;   common iliac  . PERIPHERAL VASCULAR CATHETERIZATION N/A 09/28/2015   Procedure: Lower Extremity Angiography;  Surgeon: Runell Gess, MD;  Location: [facility] INVASIVE CV LAB;  Service: Cardiovascular;  Laterality: N/A;  . PERIPHERAL VASCULAR CATHETERIZATION Left 09/28/2015   Procedure: Peripheral Vascular Intervention;  Surgeon: Runell Gess, MD;  Location: [facility] INVASIVE CV LAB;  Service: Cardiovascular;  Laterality: Left CFA  PCI with 9 mm x 4 cm Abbott nitinol absolute Pro self-expanding stent     . SKIN SPLIT GRAFT Left 12/01/2015   Procedure: LEFT FOOT SKIN GRAFT AND VAC;  Surgeon: [name], MD;  Location: [facility] OR;  Service: Orthopedics;  Laterality: Left;  . THROMBECTOMY FEMORAL ARTERY Right 10/22/2016   Procedure: THROMBECTOMY FEMORAL ARTERY;  Surgeon: Chuck Hint, MD;  Location: [facility] OR;  Service: Vascular;  Laterality: Right;  . TRANSTHORACIC ECHOCARDIOGRAM  10/17/2009   EF 45-50%, moderate hypokinesis of the  entire inferolateral myocardium, mild concentric hypertrophy and mild regurg of the mitral valva.  Marland Kitchen VEIN HARVEST Left 11/02/2015   Procedure: LEFT GREATER SAPHENOUS VEIN HARVEST;  Surgeon: Angelia Mould, MD;  Location: [place];  Service: Vascular;  Laterality: Left;     Home Medications:  Prior to Admission medications   Medication Sig Start Date End Date Taking? Authorizing Provider  acetaminophen (TYLENOL) 325 MG tablet Take 650 mg by mouth every 6 (six) hours as needed for mild pain, fever or headache.   Yes [provider]  aspirin EC 81 MG tablet Take 81 mg by mouth daily.   Yes [provider]  atorvastatin (LIPITOR) 80 MG tablet Take 80 mg by mouth at bedtime.    Yes [provider]  clopidogrel (PLAVIX) 75 MG tablet Take 75 mg by mouth daily.    Yes [provider]  furosemide (LASIX) 40 MG tablet Take 1 tablet (40 mg total) by mouth 2 (two) times daily. Patient taking differently: Take 40 mg by mouth See admin instructions.  Take 40 mg by mouth two times a day FOR 1 WEEK, STARTING ON 03/11/2019, THEN RE-VISIT WITH MD 02/21/18  Yes Mariel Aloe, MD  gabapentin (NEURONTIN) 300 MG capsule Take 1 capsule (300 mg total) by mouth at bedtime. Patient taking differently: Take 300 mg by mouth daily as needed (for nerve pain).  05/13/18  Yes Domenic Polite, MD  isosorbide mononitrate (IMDUR) 60 MG 24 hr tablet Take 1 tablet (60 mg total) by mouth daily. (Take along with 30 mg to equal 90 mg daily) Patient taking differently: Take 60 mg by mouth See admin instructions. Take 60 mg by mouth two times a day FOR 1 WEEK, STARTING ON 03/11/2019, THEN RE-VISIT WITH MD 05/13/18  Yes Domenic Polite, MD  LEVEMIR FLEXTOUCH 100 UNIT/ML Pen Inject 35 Units into the skin at bedtime.  03/15/16  Yes [provider]  metFORMIN (GLUCOPHAGE) 1000 MG tablet Take 1,000 mg by mouth 2 (two) times daily with a meal.   Yes [provider]  metoprolol succinate (TOPROL-XL) 50 MG 24 hr tablet TAKE 1 AND 1/2 TABLETS BY MOUTH DAILY Patient taking differently: Take 75 mg by mouth daily.  04/15/18  Yes Troy Sine, MD  nitroGLYCERIN (NITROSTAT) 0.4 MG SL tablet Place 0.4 mg under the tongue every 5 (five) minutes as needed for chest pain.  05/15/18  Yes [provider]  pantoprazole (PROTONIX) 40 MG tablet Take 1 tablet (40 mg total) by mouth daily. 12/21/18  Yes Troy Sine, MD  VENTOLIN HFA 108 (90 Base) MCG/ACT inhaler Inhale 1 puff into the lungs every 4 (four) hours as needed for shortness of breath. 03/02/18  Yes [provider]    Inpatient Medications: Scheduled Meds: . aspirin EC  81 mg Oral Daily  . atorvastatin  80 mg Oral QHS  . clopidogrel  75 mg Oral Daily  . [START ON 03/13/2019] furosemide  40 mg Intravenous Daily  . heparin  5,000 Units Subcutaneous Q8H  . insulin aspart  0-9 Units Subcutaneous Q4H  . isosorbide mononitrate  120 mg Oral Daily  . metFORMIN  1,000 mg Oral BID WC  . metoprolol  succinate  75 mg Oral Daily  . pantoprazole  40 mg Oral Daily  . sodium chloride flush  3 mL Intravenous Once  . sodium chloride flush  3 mL Intravenous Q12H   Continuous Infusions: . sodium chloride     PRN Meds: sodium chloride, acetaminophen **OR**  acetaminophen, albuterol, gabapentin, hydrALAZINE, nitroGLYCERIN, sodium chloride flush  Allergies:    Allergies  Allergen Reactions  . Doxycycline Other (See Comments)    Lethargy   . Hydrochlorothiazide Other (See Comments)    Lethargy   . Latex Rash  . Penicillins Swelling and Rash    Pt states she has tolerated Keflex in the past without problems. States she may have tolerated Augmentin in the past but it caused GI upset. Has patient had a PCN reaction causing immediate rash, facial/tongue/throat swelling, SOB or lightheadedness with hypotension: Yes Has patient had a PCN reaction causing severe rash involving mucus membranes or skin necrosis: No Has patient had a PCN reaction that required hospitalization No Has patient had a PCN reaction occurring within the last 10 years: No    Social History:   Social History   Socioeconomic History  . Marital status: Legally Separated    Spouse name: Not on file  . Number of children: Not on file  . Years of education: Not on file  . Highest education level: Not on file  Occupational History  . Occupation: retired Occupational hygienist  Tobacco Use  . Smoking status: Former Smoker    Packs/day: 1.50    Years: 41.00    Pack years: 61.50    Types: Cigarettes    Quit date: 10/12/2009    Years since quitting: 9.4  . Smokeless tobacco: Never Used  Substance and Sexual Activity  . Alcohol use: No  . Drug use: No  . Sexual activity: Not Currently    Birth control/protection: Surgical  Other Topics Concern  . Not on file  Social History Narrative   Admitted to [facility]   Former Smoker - stopped 2011   Alcohol  None   Full code   Social Determinants of Health    Financial Resource Strain:   . Difficulty of Paying Living Expenses: Not on file  Food Insecurity:   . Worried About Programme researcher, broadcasting/film/video in the Last Year: Not on file  . Ran Out of Food in the Last Year: Not on file  Transportation Needs:   . Lack of Transportation (Medical): Not on file  . Lack of Transportation (Non-Medical): Not on file  Physical Activity:   . Days of Exercise per Week: Not on file  . Minutes of Exercise per Session: Not on file  Stress:   . Feeling of Stress : Not on file  Social Connections:   . Frequency of Communication with Friends and Family: Not on file  . Frequency of Social Gatherings with Friends and Family: Not on file  . Attends Religious Services: Not on file  . Active Member of Clubs or Organizations: Not on file  . Attends Banker Meetings: Not on file  . Marital Status: Not on file  Intimate Partner Violence:   . Fear of Current or Ex-Partner: Not on file  . Emotionally Abused: Not on file  . Physically Abused: Not on file  . Sexually Abused: Not on file    Family History:   Family History  Problem Relation Age of Onset  . Hypertension Mother   . Heart failure Mother   . Heart failure Father   . Stroke Father   . Diabetes Father      ROS:  Please see the history of present illness.  All other ROS reviewed and negative.     Physical Exam/Data:   Vitals:   03/12/19 0556 03/12/19 0749 03/12/19 1031  03/12/19 1034  BP: (!) 149/68 92/67 140/68 (!) 84/62  Pulse: 93 94  96  Resp: 20 17  (!) 28  Temp: 97.6 F (36.4 C) 97.7 F (36.5 C)    TempSrc: Oral Oral    SpO2: 99% 98%  98%  Weight:      Height:        Intake/Output Summary (Last 24 hours) at 03/12/2019 1157 Last data filed at 03/12/2019 1037 Gross per 24 hour  Intake --  Output 450 ml  Net -450 ml   Last 3 Weights 03/12/2019 05/12/2018 05/11/2018  Weight (lbs) 166 lb 3.6 oz 185 lb 13.6 oz 181 lb 8 oz  Weight (kg) 75.4 kg 84.3 kg 82.328 kg     Body mass  index is 28.53 kg/m.  General:  Well nourished, well developed, in no acute distress HEENT: normal Lymph: no adenopathy Neck: no JVD Endocrine:  No thryomegaly Vascular: No carotid bruits; FA pulses 2+ bilaterally without bruits  Cardiac:  normal S1, S2; RRR; no murmur  Lungs:  clear to auscultation bilaterally, no wheezing, rhonchi or rales  Abd: soft, nontender, no hepatomegaly  Ext: 2+ edema Musculoskeletal:  No deformities, BUE and BLE strength normal and equal Skin: warm and dry  Neuro:  CNs 2-12 intact, no focal abnormalities noted Psych:  Normal affect   EKG:  The EKG was personally reviewed and demonstrates:  NSR, TWI V5,V6, II, minimal St dep in aVf, PAC, ?first degree AV block Telemetry:  Telemetry was personally reviewed and demonstrates:  NSR HR 80-90s, rare PVCs  Relevant CV Studies:  Echo 03/12/19 1. Diffuse hypokinesis worse in the septum and inferior base. Left  ventricular ejection fraction, by estimation, is 25 to 30%. The left  ventricle has moderate to severely decreased function. The left ventricle  demonstrates global hypokinesis. The left  ventricular internal cavity size was moderately dilated. Left ventricular  diastolic parameters are consistent with Grade III diastolic dysfunction  (restrictive). Elevated left ventricular end-diastolic pressure.  2. Right ventricular systolic function is normal. The right ventricular  size is normal. There is moderately elevated pulmonary artery systolic  pressure.  3. Left atrial size was moderately dilated.  4. Right atrial size was mildly dilated.  5. The mitral valve is normal in structure and function. Mild mitral  valve regurgitation. No evidence of mitral stenosis.  6. The aortic valve is normal in structure and function. Aortic valve  regurgitation is not visualized. No aortic stenosis is present.  Cardiac Cath 05/11/2018  Ost Cx to Prox Cx lesion is 85% stenosed.  Mid LM to Dist LM lesion is 80%  stenosed.  Mid Cx to Dist Cx lesion is 50% stenosed.  Mid Cx lesion is 40% stenosed.  Prox LAD to Mid LAD lesion is 30% stenosed.  LV end diastolic pressure is moderately elevated.  There is mild to moderate left ventricular systolic dysfunction.   Significant coronary calcification with eccentric 80% mid distal left main stenosis, mild diffuse irregularity of the LAD with 30% stenoses, and a large dominant left calcified left circumflex coronary artery with previously placed stent in the proximal circumflex with focal mid instent 85% stenosis, as well as segmental 40 and 50% mid AV groove stenoses.  The RCA is a small nondominant normal vessel.  Moderate LV dysfunction with an EF estimated at 40 to 45% with hypocontractility involving the mid anterolateral wall and basal mid inferior wall.  LVEDP elevated at 28 mmHg.  RECOMMENDATION: Surgical consultation for CABG revascularization.  Echo  05/09/2018 1. The left ventricle had a visually estimated ejection fraction of of  45%. The cavity size was normal. Left ventricular diastolic Doppler  parameters are consistent with pseudonormal. Elevated left ventricular  end-diastolic pressure The E/e' is 78.9.  Left ventricular diffuse hypokinesis.  2. The right ventricle has normal systolc function. The cavity was  normal. There is no increase in right ventricular wall thickness. Right  ventricular systolic pressure could not be assessed.  3. The mitral valve is abnormal. Mild thickening of the mitral valve  leaflet. There is moderate mitral annular calcification present.  4. The inferior vena cava was dilated in size with <50% respiratory  variability.  5. When compared to the prior study: Side by side comparison of images  performed to 02/13/2018 exam. No significant change in LV function or wall  motion.   Laboratory Data:  High Sensitivity Troponin:   Recent Labs  Lab 03/11/19 1823 03/11/19 2030 03/12/19 0344  TROPONINIHS  123* 110* 115*     Chemistry Recent Labs  Lab 03/11/19 1823 03/11/19 2213 03/12/19 0344  NA 139  --  140  K 5.0  --  4.1  CL 105  --  105  CO2 21*  --  23  GLUCOSE 157*  --  129*  BUN 35*  --  33*  CREATININE 1.34* 1.31* 1.16*  CALCIUM 9.1  --  9.0  GFRNONAA 41* 42* 48*  GFRAA 47* 48* 56*  ANIONGAP 13  --  12    Recent Labs  Lab 03/12/19 0344  PROT 6.7  ALBUMIN 3.4*  AST 24  ALT 24  ALKPHOS 97  BILITOT 0.8   Hematology Recent Labs  Lab 03/11/19 1823 03/12/19 0344  WBC 9.2 9.7  RBC 3.60* 3.39*  HGB 10.1* 9.3*  HCT 31.0* 29.0*  MCV 86.1 85.5  MCH 28.1 27.4  MCHC 32.6 32.1  RDW 14.5 14.6  PLT 310 288   BNP Recent Labs  Lab 03/11/19 1823  BNP 2,460.7*    DDimer No results for input(s): DDIMER in the last 168 hours.   Radiology/Studies:  DG Chest 2 View  Result Date: 03/11/2019 CLINICAL DATA:  Chest pain EXAM: CHEST - 2 VIEW COMPARISON:  May 08, 2018 FINDINGS: The heart size and mediastinal contours are unchanged with mild prominence. Aortic knob calcifications. There is mildly increased interstitial markings seen throughout both lungs. No large airspace consolidation or pleural effusion. No acute osseous abnormality. Again noted is a loose body around the right shoulder. IMPRESSION: Mildly increased interstitial markings throughout both lungs which could be due to interstitial edema. Electronically Signed   By: Jonna Clark M.D.   On: 03/11/2019 18:53   ECHOCARDIOGRAM COMPLETE  Result Date: 03/12/2019    ECHOCARDIOGRAM REPORT   Patient Name:   [name] [name] Date of Exam: 03/12/2019 Medical Rec #:  [id number]    Height:       64.0 in Accession #:    [id number]   Weight:       166.2 lb Date of Birth:  09/19/1950    BSA:          1.81 m Patient Age:    68 years     BP:           92/67 mmHg Patient Gender: F            HR:           96 bpm. Exam Location:  Inpatient Procedure: 2D Echo, Cardiac Doppler and Color Doppler  Indications:    CHF-Acute Systolic 428.21   History:        Patient has prior history of Echocardiogram examinations, most                 recent 05/09/2018. CHF, CAD and Previous Myocardial Infarction,                 Stroke, Signs/Symptoms:Hypotension and Dyspnea; Risk                 Factors:Diabetes, Dyslipidemia and Former Smoker. PVD. PAD.  Sonographer:    Renella Cunas RDCS Referring Phys: [id number] JAMES KIM IMPRESSIONS  1. Diffuse hypokinesis worse in the septum and inferior base. Left ventricular ejection fraction, by estimation, is 25 to 30%. The left ventricle has moderate to severely decreased function. The left ventricle demonstrates global hypokinesis. The left ventricular internal cavity size was moderately dilated. Left ventricular diastolic parameters are consistent with Grade III diastolic dysfunction (restrictive). Elevated left ventricular end-diastolic pressure.  2. Right ventricular systolic function is normal. The right ventricular size is normal. There is moderately elevated pulmonary artery systolic pressure.  3. Left atrial size was moderately dilated.  4. Right atrial size was mildly dilated.  5. The mitral valve is normal in structure and function. Mild mitral valve regurgitation. No evidence of mitral stenosis.  6. The aortic valve is normal in structure and function. Aortic valve regurgitation is not visualized. No aortic stenosis is present. FINDINGS  Left Ventricle: Diffuse hypokinesis worse in the septum and inferior base. Left ventricular ejection fraction, by estimation, is 25 to 30%. The left ventricle has moderate to severely decreased function. The left ventricle demonstrates global hypokinesis. The left ventricular internal cavity size was moderately dilated. There is no left ventricular hypertrophy. Left ventricular diastolic parameters are consistent with Grade III diastolic dysfunction (restrictive). Elevated left ventricular end-diastolic pressure. Right Ventricle: The right ventricular size is normal. No increase in right  ventricular wall thickness. Right ventricular systolic function is normal. There is moderately elevated pulmonary artery systolic pressure. The tricuspid regurgitant velocity is 2.94 m/s, and with an assumed right atrial pressure of 15 mmHg, the estimated right ventricular systolic pressure is 49.6 mmHg. Left Atrium: Left atrial size was moderately dilated. Right Atrium: Right atrial size was mildly dilated. Pericardium: There is no evidence of pericardial effusion. Mitral Valve: The mitral valve is normal in structure and function. There is mild thickening of the mitral valve leaflet(s). There is mild calcification of the mitral valve leaflet(s). Normal mobility of the mitral valve leaflets. Mild to moderate mitral  annular calcification. Mild mitral valve regurgitation. No evidence of mitral valve stenosis. Tricuspid Valve: The tricuspid valve is normal in structure. Tricuspid valve regurgitation is mild . No evidence of tricuspid stenosis. Aortic Valve: The aortic valve is normal in structure and function. Aortic valve regurgitation is not visualized. No aortic stenosis is present. Pulmonic Valve: The pulmonic valve was normal in structure. Pulmonic valve regurgitation is not visualized. No evidence of pulmonic stenosis. Aorta: The aortic root is normal in size and structure. IAS/Shunts: No atrial level shunt detected by color flow Doppler.  LEFT VENTRICLE PLAX 2D LVIDd:         5.55 cm      Diastology LVIDs:         4.59 cm      LV e' lateral:   5.50 cm/s LV PW:         0.85 cm      LV E/e' lateral: 20.0  LV IVS:        0.86 cm      LV e' medial:    2.81 cm/s LVOT diam:     1.90 cm      LV E/e' medial:  39.1 LV SV:         34.31 ml LV SV Index:   28.76 LVOT Area:     2.84 cm  LV Volumes (MOD) LV vol d, MOD A2C: 166.0 ml LV vol d, MOD A4C: 158.0 ml LV vol s, MOD A2C: 101.0 ml LV vol s, MOD A4C: 116.0 ml LV SV MOD A2C:     65.0 ml LV SV MOD A4C:     158.0 ml LV SV MOD BP:      54.6 ml RIGHT VENTRICLE RV S prime:      10.30 cm/s TAPSE (M-mode): 1.9 cm LEFT ATRIUM             Index       RIGHT ATRIUM           Index LA diam:        4.70 cm 2.60 cm/m  RA Area:     12.70 cm LA Vol (A2C):   56.5 ml 31.24 ml/m RA Volume:   29.60 ml  16.37 ml/m LA Vol (A4C):   36.8 ml 20.35 ml/m LA Biplane Vol: 48.1 ml 26.60 ml/m  AORTIC VALVE LVOT Vmax:   58.00 cm/s LVOT Vmean:  37.700 cm/s LVOT VTI:    0.121 m  AORTA Ao Root diam: 2.40 cm MITRAL VALVE                TRICUSPID VALVE MV Area (PHT): 5.31 cm     TR Peak grad:   34.6 mmHg MV Decel Time: 143 msec     TR Vmax:        294.00 cm/s MV E velocity: 110.00 cm/s MV A velocity: 66.00 cm/s   SHUNTS MV E/A ratio:  1.67         Systemic VTI:  0.12 m                             Systemic Diam: 1.90 cm Charlton Haws MD Electronically signed by Charlton Haws MD Signature Date/Time: 03/12/2019/11:23:45 AM    Final   [id number]} TIMI Risk Score for Unstable Angina or Non-ST Elevation MI:   The patient's TIMI risk score is 5, which indicates a 26% risk of all cause mortality, new or recurrent myocardial infarction or need for urgent revascularization in the next 14 days.   Assessment and Plan:   Chest pain/CAD - could be UA vs volume overload - patient had recent cardiac cath 04/2018 showing left main and left Cx disease evaluated by CTTS who recommended medical therapy - HS troponin trend 123>110 > 115  - EKG with some diffuse ST depression  - echo this admission showed new low EF 25-35%, G3DD, global hypokinesis - continue aspirin and statin - continue BB - continue Imdur 120 mg daily - Given drop in EF, discussed with interventional cardiology: recommend medical management as left main lesion is heavily calcified   Acute on Chronic combined systolic and diastolic Heart failure - Presented with SOB and lower leg edema.  - EF in 04/2018 was 45%. EF this admission low 25-30%  - BNP 2,460 - given IV lasix 80 in the ED >> IV lasix 40 mg BID - creatinine improved with diuresis  -  strict  I/Os - daily weights - Breathing is improving with diuresis.   HTN - Imdur 120 and Toprol-XL 100mg  at baseline>>held this AM for soft pressures  HLD - continue atorvastatin 80 mg daily - LDL 101  DM2 - SSI per IM   For questions or updates, please contact [facility] Please consult [email or web address] for contact info under   Signed, Cadence David Stall, PA-C  03/12/2019 11:57 AM    Patient seen and examined.  Agree with above documentation.  [name] [name] is a 69 year old female with a history of CAD, PAD, chronic systolic heart failure (EF 45%), type 2 diabetes, hypertension, CKD, tobacco use, CVA who we are consulted for chest pain at the request of Dr. Benjamine Mola.  Patient has a complicated coronary history.  She had a STEMI in 2011 with drug-eluting stent to left circumflex.  She also underwent POBA to distal circumflex.  In April 2020, she was admitted with NSTEMI.  TTE showed EF 45%.  Cath showed 80% mid left main disease, 85% ostial left circumflex disease, 50% mid left circumflex disease, 30% proximal LAD disease.  Cardiac surgery was consulted but patient was felt to be high risk surgical candidate and recommended medical therapy.  Not felt to be a good PCI candidate due to heavy calcifications of lesions.  Patient presented to the ED yesterday with chest pain and shortness of breath.  Reports she had a 30-minute episode of substernal chest pain 3 days ago that she described as pressure.  Resolved with nitroglycerin.  Had another episode of chest pain 2 days ago that lasted 20 minutes and resolved with nitroglycerin.  States that both episodes of chest pain occurred as she was walking around her home.  States that this is the first time she has had chest pain since her cath last April.  In addition, she has noted progressive shortness of breath and lower extremity edema for the last week.  She reports compliance with her daily Lasix 40 mg.  In the ED, BP initially 160/78, pulse 98, SPO2 96%.  Labs  notable for creatinine 1.3, high-sensitivity troponin 123 ->110, BNP 2460, hemoglobin 10.1.  Covid negative.  EKG shows sinus rhythm, rate 93, 1 mm ST elevation in aVR with diffuse ST depressions.  Echocardiogram done today shows EF 25 to 30% (down from 45% on last echo in 04/2018), with diffuse hypokinesis worse in the septum and inferior base.  On exam, patient is alert and oriented, regular rate and rhythm, no murmurs, lungs with bibasilar crackles and expiratory wheezing, 1+LE edema.  Suspect troponin elevation represents demand ischemia in setting of decompensated heart failure.  For decompensated heart failure, renal function improving with diuresis, creatinine 1.16 today.  Would continue IV diuresis, will increase to IV Lasix 40 mg twice daily.  Given worsening systolic dysfunction and now with exertional chest pain, discussed case and reviewed cath films with interventional cardiologist, Dr Allyson Sabal.  Did not feel that her lesions are amenable to PCI given extensively calcified left main lesion.  Intervention would require Impella support, which would be difficult given extensive PAD and limited access options.  Recommended medical management.  Little Ishikawa, MD

## 2019-03-12 NOTE — ED Notes (Signed)
Sprite at bedside, head of bed raised per pt request

## 2019-03-12 NOTE — Progress Notes (Signed)
Progress Note    SHAKEENA KAFER  OEU:[id number] DOB: March 26, 1950  DOA: 03/11/2019 PCP: Georgann Housekeeper, MD    Brief Narrative:    Medical records reviewed and are as summarized below:  [name] [name] is an 69 y.o. 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  Substernal aching, without radiation, which occurred at rest. Pt states relief with nitroglycerin after a few minutes.    Assessment/Plan:   Principal Problem:   Acute on chronic combined systolic and diastolic CHF (congestive heart failure) (HCC) Active Problems:   Uncontrolled type 2 diabetes mellitus with complication (HCC)   Acute systolic CHF (congestive heart failure) (HCC)   Dyspnea   Elevated troponin   Chest pain   Dyspnea due to Acute on Chronic Systolic/ Diastolic CHF -echo -Cont Troprol XL 75mg  po qday -Lasix 40mg  IV -cardiology consult  Chest pain, Elevated troponin, h/o CAD w NSTEMI 04/2018 -LDL 101 -aspirin/ Plavix  -Increase Imdur 60mg  po qday-> 120mg  po qday  Cont Toprol XL as above Cont Lipitor 80mg  po qhs Cardiology consult   Hypertension  -BP in L is higher than right -Subclavians: Right subclavian artery flow was disturbed. Normal flow hemodynamics were seen in the left subclavian artery. Plaque  noted proximally on the right  Dm2 -SSI -d/c metformin  Anemia (chronic) -trend CBC  Gerd Cont PPI  CKD stage IIIb -monitor   Family Communication/Anticipated D/C date and plan/Code Status   DVT prophylaxis: heparin Code Status: Full Code.  Family Communication:  Disposition Plan: pending cardiology eval   Medical Consultants:    cards     Subjective:   Breathing better Hesitant to get LHC  Objective:    Vitals:   03/12/19 0556 03/12/19 0749 03/12/19  1031 03/12/19 1034  BP: (!) 149/68 92/67 140/68 (!) 84/62  Pulse: 93 94  96  Resp: 20 17  (!) 28  Temp: 97.6 F (36.4 C) 97.7 F (36.5 C)    TempSrc: Oral Oral    SpO2: 99% 98%  98%  Weight:      Height:        Intake/Output Summary (Last 24 hours) at 03/12/2019 1329 Last data filed at 03/12/2019 1037 Gross per 24 hour  Intake --  Output 450 ml  Net -450 ml   Filed Weights   03/12/19 0547  Weight: 75.4 kg    Exam: In bed, NAD rrr Diminished right radial pulse No increased work of breathing A+Ox3  Data Reviewed:   I have personally reviewed following labs and imaging studies:  Labs: Labs show the following:   Basic Metabolic Panel: Recent Labs  Lab 03/11/19 1823 03/11/19 2213 03/12/19 0344  NA 139  --  140  K 5.0  --  4.1  CL 105  --  105  CO2 21*  --  23  GLUCOSE 157*  --  129*  BUN 35*  --  33*  CREATININE 1.34* 1.31* 1.16*  CALCIUM 9.1  --  9.0  MG  --   --  1.6*   GFR Estimated Creatinine Clearance: 46.2 mL/min (A) (by C-G formula based on SCr of 1.16 mg/dL (H)). Liver Function Tests: Recent Labs  Lab 03/12/19 0344  AST 24  ALT 24  ALKPHOS 97  BILITOT 0.8  PROT 6.7  ALBUMIN 3.4*   No results for input(s): LIPASE, AMYLASE in the last 168 hours. No results for input(s): AMMONIA in the last 168 hours. Coagulation profile No results for input(s): INR, PROTIME in the last 168 hours.  CBC: Recent Labs  Lab 03/11/19 1823 03/12/19 0344  WBC 9.2 9.7  HGB 10.1* 9.3*  HCT 31.0* 29.0*  MCV 86.1 85.5  PLT 310 288   Cardiac Enzymes: No results for input(s): CKTOTAL, CKMB, CKMBINDEX, TROPONINI in the last 168 hours. BNP (last 3 results) No results for input(s): PROBNP in the last 8760 hours. CBG: Recent Labs  Lab 03/11/19 2351 03/12/19 0346 03/12/19 0753 03/12/19 1214  GLUCAP 124* 122* 140* 129*   D-Dimer: No results for input(s): DDIMER in the last 72 hours. Hgb A1c: No results for input(s): HGBA1C in the last 72 hours. Lipid  Profile: Recent Labs    03/12/19 0344  CHOL 162  HDL 26*  LDLCALC 101*  TRIG 176*  CHOLHDL 6.2   Thyroid function studies: No results for input(s): TSH, T4TOTAL, T3FREE, THYROIDAB in the last 72 hours.  Invalid input(s): FREET3 Anemia work up: No results for input(s): VITAMINB12, FOLATE, FERRITIN, TIBC, IRON, RETICCTPCT in the last 72 hours. Sepsis Labs: Recent Labs  Lab 03/11/19 1823 03/12/19 0344  WBC 9.2 9.7    Microbiology Recent Results (from the past 240 hour(s))  SARS CORONAVIRUS 2 (TAT 6-24 HRS) Nasopharyngeal Nasopharyngeal Swab     Status: None   Collection Time: 03/11/19  9:32 PM   Specimen: Nasopharyngeal Swab  Result Value Ref Range Status   SARS Coronavirus 2 NEGATIVE NEGATIVE Final    Comment: (NOTE) SARS-CoV-2 target nucleic acids are NOT DETECTED. The SARS-CoV-2 RNA is generally detectable in upper and lower respiratory specimens during the acute phase of infection. Negative results do not preclude SARS-CoV-2 infection, do not rule out co-infections with other pathogens, and should not be used as the sole basis for treatment or other patient management decisions. Negative results must be combined with clinical observations, patient history, and epidemiological information. The expected result is Negative. Fact Sheet for Patients: HairSlick.no Fact Sheet for Healthcare Providers: quierodirigir.com This test is not yet approved or cleared by the [place] FDA and  has been authorized for detection and/or diagnosis of SARS-CoV-2 by FDA under an Emergency Use Authorization (EUA). This EUA will remain  in effect (meaning this test can be used) for the duration of the COVID-19 declaration under Section 56 4(b)(1) of the Act, 21 U.S.C. section 360bbb-3(b)(1), unless the authorization is terminated or revoked sooner. Performed at [facility], [address] [place], [place] [address]      Procedures and diagnostic studies:  DG Chest 2 View  Result Date: 03/11/2019 CLINICAL DATA:  Chest pain EXAM: CHEST - 2 VIEW COMPARISON:  May 08, 2018 FINDINGS: The heart size and mediastinal contours are unchanged with mild prominence. Aortic knob calcifications. There is mildly increased interstitial markings seen throughout both lungs. No large airspace consolidation or pleural effusion. No acute osseous abnormality. Again noted is a loose body around the right shoulder. IMPRESSION: Mildly increased interstitial markings throughout both lungs which could be due to interstitial edema. Electronically Signed   By: Jonna Clark M.D.   On: 03/11/2019 18:53   ECHOCARDIOGRAM COMPLETE  Result Date: 03/12/2019    ECHOCARDIOGRAM REPORT   Patient Name:   [name] [name] Date of Exam: 03/12/2019 Medical Rec #:  [id number]    Height:       64.0  in Accession #:    [id number]   Weight:       166.2 lb Date of Birth:  05/10/1950    BSA:          1.81 m Patient Age:    19 years     BP:           92/67 mmHg Patient Gender: F            HR:           96 bpm. Exam Location:  Inpatient Procedure: 2D Echo, Cardiac Doppler and Color Doppler Indications:    CHF-Acute Systolic 244.01  History:        Patient has prior history of Echocardiogram examinations, most                 recent 05/09/2018. CHF, CAD and Previous Myocardial Infarction,                 Stroke, Signs/Symptoms:Hypotension and Dyspnea; Risk                 Factors:Diabetes, Dyslipidemia and Former Smoker. PVD. PAD.  Sonographer:    Vickie Epley RDCS Referring Phys: [place]  1. Diffuse hypokinesis worse in the septum and inferior base. Left ventricular ejection fraction, by estimation, is 25 to 30%. The left ventricle has moderate to severely decreased function. The left ventricle demonstrates global hypokinesis. The left ventricular internal cavity size was moderately dilated. Left ventricular diastolic parameters are consistent with Grade III  diastolic dysfunction (restrictive). Elevated left ventricular end-diastolic pressure.  2. Right ventricular systolic function is normal. The right ventricular size is normal. There is moderately elevated pulmonary artery systolic pressure.  3. Left atrial size was moderately dilated.  4. Right atrial size was mildly dilated.  5. The mitral valve is normal in structure and function. Mild mitral valve regurgitation. No evidence of mitral stenosis.  6. The aortic valve is normal in structure and function. Aortic valve regurgitation is not visualized. No aortic stenosis is present. FINDINGS  Left Ventricle: Diffuse hypokinesis worse in the septum and inferior base. Left ventricular ejection fraction, by estimation, is 25 to 30%. The left ventricle has moderate to severely decreased function. The left ventricle demonstrates global hypokinesis. The left ventricular internal cavity size was moderately dilated. There is no left ventricular hypertrophy. Left ventricular diastolic parameters are consistent with Grade III diastolic dysfunction (restrictive). Elevated left ventricular end-diastolic pressure. Right Ventricle: The right ventricular size is normal. No increase in right ventricular wall thickness. Right ventricular systolic function is normal. There is moderately elevated pulmonary artery systolic pressure. The tricuspid regurgitant velocity is 2.94 m/s, and with an assumed right atrial pressure of 15 mmHg, the estimated right ventricular systolic pressure is 02.7 mmHg. Left Atrium: Left atrial size was moderately dilated. Right Atrium: Right atrial size was mildly dilated. Pericardium: There is no evidence of pericardial effusion. Mitral Valve: The mitral valve is normal in structure and function. There is mild thickening of the mitral valve leaflet(s). There is mild calcification of the mitral valve leaflet(s). Normal mobility of the mitral valve leaflets. Mild to moderate mitral  annular calcification. Mild  mitral valve regurgitation. No evidence of mitral valve stenosis. Tricuspid Valve: The tricuspid valve is normal in structure. Tricuspid valve regurgitation is mild . No evidence of tricuspid stenosis. Aortic Valve: The aortic valve is normal in structure and function. Aortic valve regurgitation is not visualized. No aortic stenosis is present. Pulmonic Valve: The pulmonic valve  was normal in structure. Pulmonic valve regurgitation is not visualized. No evidence of pulmonic stenosis. Aorta: The aortic root is normal in size and structure. IAS/Shunts: No atrial level shunt detected by color flow Doppler.  LEFT VENTRICLE PLAX 2D LVIDd:         5.55 cm      Diastology LVIDs:         4.59 cm      LV e' lateral:   5.50 cm/s LV PW:         0.85 cm      LV E/e' lateral: 20.0 LV IVS:        0.86 cm      LV e' medial:    2.81 cm/s LVOT diam:     1.90 cm      LV E/e' medial:  39.1 LV SV:         34.31 ml LV SV Index:   28.76 LVOT Area:     2.84 cm  LV Volumes (MOD) LV vol d, MOD A2C: 166.0 ml LV vol d, MOD A4C: 158.0 ml LV vol s, MOD A2C: 101.0 ml LV vol s, MOD A4C: 116.0 ml LV SV MOD A2C:     65.0 ml LV SV MOD A4C:     158.0 ml LV SV MOD BP:      54.6 ml RIGHT VENTRICLE RV S prime:     10.30 cm/s TAPSE (M-mode): 1.9 cm LEFT ATRIUM             Index       RIGHT ATRIUM           Index LA diam:        4.70 cm 2.60 cm/m  RA Area:     12.70 cm LA Vol (A2C):   56.5 ml 31.24 ml/m RA Volume:   29.60 ml  16.37 ml/m LA Vol (A4C):   36.8 ml 20.35 ml/m LA Biplane Vol: 48.1 ml 26.60 ml/m  AORTIC VALVE LVOT Vmax:   58.00 cm/s LVOT Vmean:  37.700 cm/s LVOT VTI:    0.121 m  AORTA Ao Root diam: 2.40 cm MITRAL VALVE                TRICUSPID VALVE MV Area (PHT): 5.31 cm     TR Peak grad:   34.6 mmHg MV Decel Time: 143 msec     TR Vmax:        294.00 cm/s MV E velocity: 110.00 cm/s MV A velocity: 66.00 cm/s   SHUNTS MV E/A ratio:  1.67         Systemic VTI:  0.12 m                             Systemic Diam: 1.90 cm Charlton Haws MD  Electronically signed by Charlton Haws MD Signature Date/Time: 03/12/2019/11:23:45 AM    Final     Medications:   . aspirin EC  81 mg Oral Daily  . atorvastatin  80 mg Oral QHS  . clopidogrel  75 mg Oral Daily  . furosemide  40 mg Intravenous BID  . heparin  5,000 Units Subcutaneous Q8H  . insulin aspart  0-9 Units Subcutaneous Q4H  . isosorbide mononitrate  120 mg Oral Daily  . metFORMIN  1,000 mg Oral BID WC  . metoprolol succinate  75 mg Oral Daily  . pantoprazole  40 mg Oral Daily  . sodium chloride flush  3 mL  Intravenous Once  . sodium chloride flush  3 mL Intravenous Q12H   Continuous Infusions: . sodium chloride       LOS: 0 days   Joseph Art  [facility]   How to contact the [facility] Attending or Consulting provider 7A - 7P or covering provider during after hours 7P -7A, for this patient?  1. Check the care team in [facility] and look for a) attending/consulting [facility] provider listed and b) the [facility] team listed 2. Log into [email or web address] and use [facility] universal password to access. If you do not have the password, please contact the hospital operator. 3. Locate the [facility] provider you are looking for under [facility] and page to a number that you can be directly reached. 4. If you still have difficulty reaching the provider, please page the [facility] (Director on Call) for the Hospitalists listed on amion for assistance.  03/12/2019, 1:29 PM

## 2019-03-13 DIAGNOSIS — I25110 Atherosclerotic heart disease of native coronary artery with unstable angina pectoris: Secondary | ICD-10-CM

## 2019-03-13 LAB — GLUCOSE, CAPILLARY
Glucose-Capillary: 122 mg/dL — ABNORMAL HIGH (ref 70–99)
Glucose-Capillary: 151 mg/dL — ABNORMAL HIGH (ref 70–99)
Glucose-Capillary: 163 mg/dL — ABNORMAL HIGH (ref 70–99)
Glucose-Capillary: 248 mg/dL — ABNORMAL HIGH (ref 70–99)
Glucose-Capillary: 204 mg/dL — ABNORMAL HIGH (ref 70–99)
Glucose-Capillary: 214 mg/dL — ABNORMAL HIGH (ref 70–99)

## 2019-03-13 LAB — CBC
HCT: 29.5 % — ABNORMAL LOW (ref 36.0–46.0)
Hemoglobin: 9.6 g/dL — ABNORMAL LOW (ref 12.0–15.0)
MCH: 27.7 pg (ref 26.0–34.0)
MCHC: 32.5 g/dL (ref 30.0–36.0)
MCV: 85.3 fL (ref 80.0–100.0)
Platelets: 269 10*3/uL (ref 150–400)
RBC: 3.46 MIL/uL — ABNORMAL LOW (ref 3.87–5.11)
RDW: 14.8 % (ref 11.5–15.5)
WBC: 8.7 10*3/uL (ref 4.0–10.5)
nRBC: 0 % (ref 0.0–0.2)

## 2019-03-13 LAB — BASIC METABOLIC PANEL
Anion gap: 13 (ref 5–15)
BUN: 36 mg/dL — ABNORMAL HIGH (ref 8–23)
CO2: 21 mmol/L — ABNORMAL LOW (ref 22–32)
Calcium: 9.2 mg/dL (ref 8.9–10.3)
Chloride: 108 mmol/L (ref 98–111)
Creatinine, Ser: 1.24 mg/dL — ABNORMAL HIGH (ref 0.44–1.00)
GFR calc Af Amer: 52 mL/min — ABNORMAL LOW (ref >60)
GFR calc non Af Amer: 45 mL/min — ABNORMAL LOW (ref >60)
Glucose, Bld: 136 mg/dL — ABNORMAL HIGH (ref 70–99)
Potassium: 4.4 mmol/L (ref 3.5–5.1)
Sodium: 142 mmol/L (ref 135–145)

## 2019-03-13 NOTE — Progress Notes (Signed)
Progress Note    [name] [name]  BCW:[id number] DOB: [birth year]  DOA: 03/11/2019 PCP: Wenda Low, MD    Brief Narrative:    Medical records reviewed and are as summarized below:  [name] [name] is an 69 y.o. 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  Substernal aching, without radiation, which occurred at rest. Pt states relief with nitroglycerin after a few minutes.    Assessment/Plan:   Principal Problem:   Acute on chronic combined systolic and diastolic CHF (congestive heart failure) (HCC) Active Problems:   Uncontrolled type 2 diabetes mellitus with complication (HCC)   Acute systolic CHF (congestive heart failure) (HCC)   Dyspnea   Elevated troponin   Chest pain   Dyspnea due to Acute on Chronic Systolic/ Diastolic CHF -echo: Diffuse hypokinesis worse in the septum and inferior base. Left ventricular ejection fraction, by estimation, is 25 to 30%. The left  ventricle has moderate to severely decreased function. The left ventricle demonstrates global hypokinesis. -Cont Troprol XL 75mg  po qday -Lasix 40mg  IV BID -cardiology consult appreciated: They recommend another day of IV diuresis prior to discharge  Chest pain, Elevated troponin, h/o CAD w NSTEMI 04/2018 -LDL 101 -aspirin/ Plavix  -Increase Imdur 60mg  po qday-> 120mg  po qday  Cont Toprol XL as above Cont Lipitor 80mg  po qhs Cardiology consult appreciated  Hypertension  -BP in L is higher than right -Subclavians: Right subclavian artery flow was disturbed. Normal flow hemodynamics were seen in the left subclavian artery. Plaque  noted proximally on the right  Dm2 -SSI -d/c metformin while in hospital  Anemia (chronic) -trend CBC  Gerd Cont PPI  CKD stage IIIb -monitor   Family  Communication/Anticipated D/C date and plan/Code Status   DVT prophylaxis: heparin Code Status: Full Code.  Family Communication:  Disposition Plan: Home in a.m. after another day of IV diuresis   Medical Consultants:    cards     Subjective:   Legs feeling better, no cramping Breathing better  Objective:    Vitals:   03/12/19 1403 03/12/19 1430 03/12/19 1533 03/12/19 2004  BP: 132/66  126/74 (!) 128/53  Pulse: 93   96  Resp: (!) 26   14  Temp: 97.6 F (36.4 C)   98.1 F (36.7 C)  TempSrc: Oral   Oral  SpO2: 99% 94%  98%  Weight:      Height:        Intake/Output Summary (Last 24 hours) at 03/13/2019 1021 Last data filed at 03/12/2019 2000 Gross per 24 hour  Intake 170 ml  Output 800 ml  Net -630 ml   Filed Weights   03/12/19 0547  Weight: 75.4 kg    Exam: Bed, appears verbal Regular rate and rhythm No increased work of breathing, not on O2 Positive lower extremity edema Positive bowel sounds soft nontender Alert and oriented x3  Data Reviewed:   I have personally reviewed following labs and imaging studies:  Labs: Labs show the following:   Basic Metabolic Panel: Recent Labs  Lab 03/11/19 1823 03/11/19 1823 03/11/19 2213 03/12/19 0344 03/13/19 0718  NA 139  --   --  140 142  K 5.0   < >  --  4.1 4.4  CL 105  --   --  105 108  CO2 21*  --   --  23 21*  GLUCOSE 157*  --   --  129* 136*  BUN 35*  --   --  33* 36*  CREATININE 1.34*  --  1.31* 1.16* 1.24*  CALCIUM 9.1  --   --  9.0 9.2  MG  --   --   --  1.6*  --    < > = values in this interval not displayed.   GFR Estimated Creatinine Clearance: 43.2 mL/min (A) (by C-G formula based on SCr of 1.24 mg/dL (H)). Liver Function Tests: Recent Labs  Lab 03/12/19 0344  AST 24  ALT 24  ALKPHOS 97  BILITOT 0.8  PROT 6.7  ALBUMIN 3.4*   No results for input(s): LIPASE, AMYLASE in the last 168 hours. No results for input(s): AMMONIA in the last 168 hours. Coagulation profile No  results for input(s): INR, PROTIME in the last 168 hours.  CBC: Recent Labs  Lab 03/11/19 1823 03/12/19 0344 03/13/19 0718  WBC 9.2 9.7 8.7  HGB 10.1* 9.3* 9.6*  HCT 31.0* 29.0* 29.5*  MCV 86.1 85.5 85.3  PLT 310 288 269   Cardiac Enzymes: No results for input(s): CKTOTAL, CKMB, CKMBINDEX, TROPONINI in the last 168 hours. BNP (last 3 results) No results for input(s): PROBNP in the last 8760 hours. CBG: Recent Labs  Lab 03/12/19 1645 03/12/19 2003 03/12/19 2328 03/13/19 0238 03/13/19 0818  GLUCAP 171* 196* 155* 122* 151*   D-Dimer: No results for input(s): DDIMER in the last 72 hours. Hgb A1c: No results for input(s): HGBA1C in the last 72 hours. Lipid Profile: Recent Labs    03/12/19 0344  CHOL 162  HDL 26*  LDLCALC 101*  TRIG 176*  CHOLHDL 6.2   Thyroid function studies: No results for input(s): TSH, T4TOTAL, T3FREE, THYROIDAB in the last 72 hours.  Invalid input(s): FREET3 Anemia work up: No results for input(s): VITAMINB12, FOLATE, FERRITIN, TIBC, IRON, RETICCTPCT in the last 72 hours. Sepsis Labs: Recent Labs  Lab 03/11/19 1823 03/12/19 0344 03/13/19 0718  WBC 9.2 9.7 8.7    Microbiology Recent Results (from the past 240 hour(s))  SARS CORONAVIRUS 2 (TAT 6-24 HRS) Nasopharyngeal Nasopharyngeal Swab     Status: None   Collection Time: 03/11/19  9:32 PM   Specimen: Nasopharyngeal Swab  Result Value Ref Range Status   SARS Coronavirus 2 NEGATIVE NEGATIVE Final    Comment: (NOTE) SARS-CoV-2 target nucleic acids are NOT DETECTED. The SARS-CoV-2 RNA is generally detectable in upper and lower respiratory specimens during the acute phase of infection. Negative results do not preclude SARS-CoV-2 infection, do not rule out co-infections with other pathogens, and should not be used as the sole basis for treatment or other patient management decisions. Negative results must be combined with clinical observations, patient history, and epidemiological  information. The expected result is Negative. Fact Sheet for Patients: HairSlick.no Fact Sheet for Healthcare Providers: quierodirigir.com This test is not yet approved or cleared by the [place] FDA and  has been authorized for detection and/or diagnosis of SARS-CoV-2 by FDA under an Emergency Use Authorization (EUA). This EUA will remain  in effect (meaning this test can be used) for the duration of the COVID-19 declaration under Section 56 4(b)(1) of the Act, 21 U.S.C. section 360bbb-3(b)(1), unless the authorization is terminated or revoked sooner. Performed at [facility], [address], [place], [place] [address]     Procedures and diagnostic studies:  DG Chest  2 View  Result Date: 03/11/2019 CLINICAL DATA:  Chest pain EXAM: CHEST - 2 VIEW COMPARISON:  May 08, 2018 FINDINGS: The heart size and mediastinal contours are unchanged with mild prominence. Aortic knob calcifications. There is mildly increased interstitial markings seen throughout both lungs. No large airspace consolidation or pleural effusion. No acute osseous abnormality. Again noted is a loose body around the right shoulder. IMPRESSION: Mildly increased interstitial markings throughout both lungs which could be due to interstitial edema. Electronically Signed   By: Jonna Clark M.D.   On: 03/11/2019 18:53   ECHOCARDIOGRAM COMPLETE  Result Date: 03/12/2019    ECHOCARDIOGRAM REPORT   Patient Name:   [name] [name] Date of Exam: 03/12/2019 Medical Rec #:  [id number]    Height:       64.0 in Accession #:    [id number]   Weight:       166.2 lb Date of Birth:  October 07, 1950    BSA:          1.81 m Patient Age:    68 years     BP:           92/67 mmHg Patient Gender: F            HR:           96 bpm. Exam Location:  Inpatient Procedure: 2D Echo, Cardiac Doppler and Color Doppler Indications:    CHF-Acute Systolic 428.21  History:        Patient has prior history of  Echocardiogram examinations, most                 recent 05/09/2018. CHF, CAD and Previous Myocardial Infarction,                 Stroke, Signs/Symptoms:Hypotension and Dyspnea; Risk                 Factors:Diabetes, Dyslipidemia and Former Smoker. PVD. PAD.  Sonographer:    Renella Cunas RDCS Referring Phys: [id number] JAMES KIM IMPRESSIONS  1. Diffuse hypokinesis worse in the septum and inferior base. Left ventricular ejection fraction, by estimation, is 25 to 30%. The left ventricle has moderate to severely decreased function. The left ventricle demonstrates global hypokinesis. The left ventricular internal cavity size was moderately dilated. Left ventricular diastolic parameters are consistent with Grade III diastolic dysfunction (restrictive). Elevated left ventricular end-diastolic pressure.  2. Right ventricular systolic function is normal. The right ventricular size is normal. There is moderately elevated pulmonary artery systolic pressure.  3. Left atrial size was moderately dilated.  4. Right atrial size was mildly dilated.  5. The mitral valve is normal in structure and function. Mild mitral valve regurgitation. No evidence of mitral stenosis.  6. The aortic valve is normal in structure and function. Aortic valve regurgitation is not visualized. No aortic stenosis is present. FINDINGS  Left Ventricle: Diffuse hypokinesis worse in the septum and inferior base. Left ventricular ejection fraction, by estimation, is 25 to 30%. The left ventricle has moderate to severely decreased function. The left ventricle demonstrates global hypokinesis. The left ventricular internal cavity size was moderately dilated. There is no left ventricular hypertrophy. Left ventricular diastolic parameters are consistent with Grade III diastolic dysfunction (restrictive). Elevated left ventricular end-diastolic pressure. Right Ventricle: The right ventricular size is normal. No increase in right ventricular wall thickness. Right ventricular  systolic function is normal. There is moderately elevated pulmonary artery systolic pressure. The tricuspid regurgitant velocity is 2.94 m/s, and with an assumed right  atrial pressure of 15 mmHg, the estimated right ventricular systolic pressure is 49.6 mmHg. Left Atrium: Left atrial size was moderately dilated. Right Atrium: Right atrial size was mildly dilated. Pericardium: There is no evidence of pericardial effusion. Mitral Valve: The mitral valve is normal in structure and function. There is mild thickening of the mitral valve leaflet(s). There is mild calcification of the mitral valve leaflet(s). Normal mobility of the mitral valve leaflets. Mild to moderate mitral  annular calcification. Mild mitral valve regurgitation. No evidence of mitral valve stenosis. Tricuspid Valve: The tricuspid valve is normal in structure. Tricuspid valve regurgitation is mild . No evidence of tricuspid stenosis. Aortic Valve: The aortic valve is normal in structure and function. Aortic valve regurgitation is not visualized. No aortic stenosis is present. Pulmonic Valve: The pulmonic valve was normal in structure. Pulmonic valve regurgitation is not visualized. No evidence of pulmonic stenosis. Aorta: The aortic root is normal in size and structure. IAS/Shunts: No atrial level shunt detected by color flow Doppler.  LEFT VENTRICLE PLAX 2D LVIDd:         5.55 cm      Diastology LVIDs:         4.59 cm      LV e' lateral:   5.50 cm/s LV PW:         0.85 cm      LV E/e' lateral: 20.0 LV IVS:        0.86 cm      LV e' medial:    2.81 cm/s LVOT diam:     1.90 cm      LV E/e' medial:  39.1 LV SV:         34.31 ml LV SV Index:   28.76 LVOT Area:     2.84 cm  LV Volumes (MOD) LV vol d, MOD A2C: 166.0 ml LV vol d, MOD A4C: 158.0 ml LV vol s, MOD A2C: 101.0 ml LV vol s, MOD A4C: 116.0 ml LV SV MOD A2C:     65.0 ml LV SV MOD A4C:     158.0 ml LV SV MOD BP:      54.6 ml RIGHT VENTRICLE RV S prime:     10.30 cm/s TAPSE (M-mode): 1.9 cm LEFT  ATRIUM             Index       RIGHT ATRIUM           Index LA diam:        4.70 cm 2.60 cm/m  RA Area:     12.70 cm LA Vol (A2C):   56.5 ml 31.24 ml/m RA Volume:   29.60 ml  16.37 ml/m LA Vol (A4C):   36.8 ml 20.35 ml/m LA Biplane Vol: 48.1 ml 26.60 ml/m  AORTIC VALVE LVOT Vmax:   58.00 cm/s LVOT Vmean:  37.700 cm/s LVOT VTI:    0.121 m  AORTA Ao Root diam: 2.40 cm MITRAL VALVE                TRICUSPID VALVE MV Area (PHT): 5.31 cm     TR Peak grad:   34.6 mmHg MV Decel Time: 143 msec     TR Vmax:        294.00 cm/s MV E velocity: 110.00 cm/s MV A velocity: 66.00 cm/s   SHUNTS MV E/A ratio:  1.67         Systemic VTI:  0.12 m  Systemic Diam: 1.90 cm Charlton Haws MD Electronically signed by Charlton Haws MD Signature Date/Time: 03/12/2019/11:23:45 AM    Final     Medications:   . aspirin EC  81 mg Oral Daily  . atorvastatin  80 mg Oral QHS  . clopidogrel  75 mg Oral Daily  . furosemide  40 mg Intravenous BID  . heparin  5,000 Units Subcutaneous Q8H  . insulin aspart  0-9 Units Subcutaneous Q4H  . isosorbide mononitrate  120 mg Oral Daily  . metoprolol succinate  75 mg Oral Daily  . pantoprazole  40 mg Oral Daily  . sodium chloride flush  3 mL Intravenous Once  . sodium chloride flush  3 mL Intravenous Q12H   Continuous Infusions: . sodium chloride       LOS: 1 day   Joseph Art  [facility]   How to contact the [facility] Attending or Consulting provider 7A - 7P or covering provider during after hours 7P -7A, for this patient?  1. Check the care team in [facility] and look for a) attending/consulting [facility] provider listed and b) the [facility] team listed 2. Log into [email or web address] and use [place] universal password to access. If you do not have the password, please contact the hospital operator. 3. Locate the [facility] provider you are looking for under [facility] and page to a number that you can be directly reached. 4. If you still have difficulty reaching  the provider, please page the [facility] (Director on Call) for the Hospitalists listed on amion for assistance.  03/13/2019, 10:21 AM

## 2019-03-13 NOTE — Progress Notes (Addendum)
Patient refused 1400 dose of Heparin due to 0600 dose injection site slowly bled until after 0900.  Patient stated, "This happened the last time, too."

## 2019-03-13 NOTE — Progress Notes (Signed)
Albuterol neb treatment given for c/o SOB with some good effect reported.

## 2019-03-13 NOTE — Progress Notes (Signed)
Progress Note  Patient Name: [name] [name] Age Date of Encounter: 03/13/2019  Primary Cardiologist: Nicki Guadalajara, MD   Subjective   No further angina. Breathing has also improved. Even at baseline at home she sleeps in a recliner, not flat. Net diuresis 650 mL, no real change in renal parameters. Her weight was reportedly stable at home, but she was developing edema at the ned of the day. Has muscle cramps w diuretics, better after Mg.  Inpatient Medications    Scheduled Meds: . aspirin EC  81 mg Oral Daily  . atorvastatin  80 mg Oral QHS  . clopidogrel  75 mg Oral Daily  . furosemide  40 mg Intravenous BID  . heparin  5,000 Units Subcutaneous Q8H  . insulin aspart  0-9 Units Subcutaneous Q4H  . isosorbide mononitrate  120 mg Oral Daily  . metoprolol succinate  75 mg Oral Daily  . pantoprazole  40 mg Oral Daily  . sodium chloride flush  3 mL Intravenous Once  . sodium chloride flush  3 mL Intravenous Q12H   Continuous Infusions: . sodium chloride     PRN Meds: sodium chloride, acetaminophen **OR** acetaminophen, albuterol, gabapentin, hydrALAZINE, HYDROcodone-acetaminophen, nitroGLYCERIN, sodium chloride flush   Vital Signs    Vitals:   03/12/19 1403 03/12/19 1430 03/12/19 1533 03/12/19 2004  BP: 132/66  126/74 (!) 128/53  Pulse: 93   96  Resp: (!) 26   14  Temp: 97.6 F (36.4 C)   98.1 F (36.7 C)  TempSrc: Oral   Oral  SpO2: 99% 94%  98%  Weight:      Height:        Intake/Output Summary (Last 24 hours) at 03/13/2019 0956 Last data filed at 03/12/2019 2000 Gross per 24 hour  Intake 170 ml  Output 800 ml  Net -630 ml   Last 3 Weights 03/12/2019 05/12/2018 05/11/2018  Weight (lbs) 166 lb 3.6 oz 185 lb 13.6 oz 181 lb 8 oz  Weight (kg) 75.4 kg 84.3 kg 82.328 kg      Telemetry    NSR, frequent PVC, rare 3-beat NSVT - Personally Reviewed  ECG    NSR, nonspecific ST-T changes - Personally Reviewed  Physical Exam  Appears comfortable. HOB 30 deg. GEN: No  acute distress.   Neck: No JVD Cardiac: RRR, no murmurs, rubs, or gallops.  Respiratory: Clear to auscultation bilaterally. GI: Soft, nontender, non-distended  MS: trace edema, developing some wrinkles; No deformity. Neuro:  Nonfocal  Psych: Normal affect   Labs    High Sensitivity Troponin:   Recent Labs  Lab 03/11/19 1823 03/11/19 2030 03/12/19 0344  TROPONINIHS 123* 110* 115*      Chemistry Recent Labs  Lab 03/11/19 1823 03/11/19 1823 03/11/19 2213 03/12/19 0344 03/13/19 0718  NA 139  --   --  140 142  K 5.0  --   --  4.1 4.4  CL 105  --   --  105 108  CO2 21*  --   --  23 21*  GLUCOSE 157*  --   --  129* 136*  BUN 35*  --   --  33* 36*  CREATININE 1.34*   < > 1.31* 1.16* 1.24*  CALCIUM 9.1  --   --  9.0 9.2  PROT  --   --   --  6.7  --   ALBUMIN  --   --   --  3.4*  --   AST  --   --   --  24  --   ALT  --   --   --  24  --   ALKPHOS  --   --   --  97  --   BILITOT  --   --   --  0.8  --   GFRNONAA 41*   < > 42* 48* 45*  GFRAA 47*   < > 48* 56* 52*  ANIONGAP 13  --   --  12 13   < > = values in this interval not displayed.     Hematology Recent Labs  Lab 03/11/19 1823 03/12/19 0344 03/13/19 0718  WBC 9.2 9.7 8.7  RBC 3.60* 3.39* 3.46*  HGB 10.1* 9.3* 9.6*  HCT 31.0* 29.0* 29.5*  MCV 86.1 85.5 85.3  MCH 28.1 27.4 27.7  MCHC 32.6 32.1 32.5  RDW 14.5 14.6 14.8  PLT 310 288 269    BNP Recent Labs  Lab 03/11/19 1823  BNP 2,460.7*     DDimer No results for input(s): DDIMER in the last 168 hours.   Radiology    DG Chest 2 View  Result Date: 03/11/2019 CLINICAL DATA:  Chest pain EXAM: CHEST - 2 VIEW COMPARISON:  May 08, 2018 FINDINGS: The heart size and mediastinal contours are unchanged with mild prominence. Aortic knob calcifications. There is mildly increased interstitial markings seen throughout both lungs. No large airspace consolidation or pleural effusion. No acute osseous abnormality. Again noted is a loose body around the right  shoulder. IMPRESSION: Mildly increased interstitial markings throughout both lungs which could be due to interstitial edema. Electronically Signed   By: Jonna Clark M.D.   On: 03/11/2019 18:53   ECHOCARDIOGRAM COMPLETE  Result Date: 03/12/2019    ECHOCARDIOGRAM REPORT   Patient Name:   [name] [name] Date of Exam: 03/12/2019 Medical Rec #:  [id number]    Height:       64.0 in Accession #:    [id number]   Weight:       166.2 lb Date of Birth:  1951-01-27    BSA:          1.81 m Patient Age:    68 years     BP:           92/67 mmHg Patient Gender: F            HR:           96 bpm. Exam Location:  Inpatient Procedure: 2D Echo, Cardiac Doppler and Color Doppler Indications:    CHF-Acute Systolic 428.21  History:        Patient has prior history of Echocardiogram examinations, most                 recent 05/09/2018. CHF, CAD and Previous Myocardial Infarction,                 Stroke, Signs/Symptoms:Hypotension and Dyspnea; Risk                 Factors:Diabetes, Dyslipidemia and Former Smoker. PVD. PAD.  Sonographer:    Renella Cunas RDCS Referring Phys: [id number] JAMES KIM IMPRESSIONS  1. Diffuse hypokinesis worse in the septum and inferior base. Left ventricular ejection fraction, by estimation, is 25 to 30%. The left ventricle has moderate to severely decreased function. The left ventricle demonstrates global hypokinesis. The left ventricular internal cavity size was moderately dilated. Left ventricular diastolic parameters are consistent with Grade III diastolic dysfunction (restrictive). Elevated left ventricular end-diastolic pressure.  2. Right ventricular systolic  function is normal. The right ventricular size is normal. There is moderately elevated pulmonary artery systolic pressure.  3. Left atrial size was moderately dilated.  4. Right atrial size was mildly dilated.  5. The mitral valve is normal in structure and function. Mild mitral valve regurgitation. No evidence of mitral stenosis.  6. The aortic valve is  normal in structure and function. Aortic valve regurgitation is not visualized. No aortic stenosis is present. FINDINGS  Left Ventricle: Diffuse hypokinesis worse in the septum and inferior base. Left ventricular ejection fraction, by estimation, is 25 to 30%. The left ventricle has moderate to severely decreased function. The left ventricle demonstrates global hypokinesis. The left ventricular internal cavity size was moderately dilated. There is no left ventricular hypertrophy. Left ventricular diastolic parameters are consistent with Grade III diastolic dysfunction (restrictive). Elevated left ventricular end-diastolic pressure. Right Ventricle: The right ventricular size is normal. No increase in right ventricular wall thickness. Right ventricular systolic function is normal. There is moderately elevated pulmonary artery systolic pressure. The tricuspid regurgitant velocity is 2.94 m/s, and with an assumed right atrial pressure of 15 mmHg, the estimated right ventricular systolic pressure is 49.6 mmHg. Left Atrium: Left atrial size was moderately dilated. Right Atrium: Right atrial size was mildly dilated. Pericardium: There is no evidence of pericardial effusion. Mitral Valve: The mitral valve is normal in structure and function. There is mild thickening of the mitral valve leaflet(s). There is mild calcification of the mitral valve leaflet(s). Normal mobility of the mitral valve leaflets. Mild to moderate mitral  annular calcification. Mild mitral valve regurgitation. No evidence of mitral valve stenosis. Tricuspid Valve: The tricuspid valve is normal in structure. Tricuspid valve regurgitation is mild . No evidence of tricuspid stenosis. Aortic Valve: The aortic valve is normal in structure and function. Aortic valve regurgitation is not visualized. No aortic stenosis is present. Pulmonic Valve: The pulmonic valve was normal in structure. Pulmonic valve regurgitation is not visualized. No evidence of pulmonic  stenosis. Aorta: The aortic root is normal in size and structure. IAS/Shunts: No atrial level shunt detected by color flow Doppler.  LEFT VENTRICLE PLAX 2D LVIDd:         5.55 cm      Diastology LVIDs:         4.59 cm      LV e' lateral:   5.50 cm/s LV PW:         0.85 cm      LV E/e' lateral: 20.0 LV IVS:        0.86 cm      LV e' medial:    2.81 cm/s LVOT diam:     1.90 cm      LV E/e' medial:  39.1 LV SV:         34.31 ml LV SV Index:   28.76 LVOT Area:     2.84 cm  LV Volumes (MOD) LV vol d, MOD A2C: 166.0 ml LV vol d, MOD A4C: 158.0 ml LV vol s, MOD A2C: 101.0 ml LV vol s, MOD A4C: 116.0 ml LV SV MOD A2C:     65.0 ml LV SV MOD A4C:     158.0 ml LV SV MOD BP:      54.6 ml RIGHT VENTRICLE RV S prime:     10.30 cm/s TAPSE (M-mode): 1.9 cm LEFT ATRIUM             Index       RIGHT ATRIUM  Index LA diam:        4.70 cm 2.60 cm/m  RA Area:     12.70 cm LA Vol (A2C):   56.5 ml 31.24 ml/m RA Volume:   29.60 ml  16.37 ml/m LA Vol (A4C):   36.8 ml 20.35 ml/m LA Biplane Vol: 48.1 ml 26.60 ml/m  AORTIC VALVE LVOT Vmax:   58.00 cm/s LVOT Vmean:  37.700 cm/s LVOT VTI:    0.121 m  AORTA Ao Root diam: 2.40 cm MITRAL VALVE                TRICUSPID VALVE MV Area (PHT): 5.31 cm     TR Peak grad:   34.6 mmHg MV Decel Time: 143 msec     TR Vmax:        294.00 cm/s MV E velocity: 110.00 cm/s MV A velocity: 66.00 cm/s   SHUNTS MV E/A ratio:  1.67         Systemic VTI:  0.12 m                             Systemic Diam: 1.90 cm Jenkins Rouge MD Electronically signed by Jenkins Rouge MD Signature Date/Time: 03/12/2019/11:23:45 AM    Final     Cardiac Studies  ECHO 03/13/2019 1. Diffuse hypokinesis worse in the septum and inferior base. Left  ventricular ejection fraction, by estimation, is 25 to 30%. The left  ventricle has moderate to severely decreased function. The left ventricle  demonstrates global hypokinesis. The left  ventricular internal cavity size was moderately dilated. Left ventricular  diastolic  parameters are consistent with Grade III diastolic dysfunction  (restrictive). Elevated left ventricular end-diastolic pressure.  2. Right ventricular systolic function is normal. The right ventricular  size is normal. There is moderately elevated pulmonary artery systolic  pressure.  3. Left atrial size was moderately dilated.  4. Right atrial size was mildly dilated.  5. The mitral valve is normal in structure and function. Mild mitral  valve regurgitation. No evidence of mitral stenosis.  6. The aortic valve is normal in structure and function. Aortic valve  regurgitation is not visualized. No aortic stenosis is present.   Patient Profile     69 y.o. female with severe multivessel CAD, not a candidate for surgical or percutaneous revascularization, presents with unstable angina due to acute on chronic HF, gradual decompensation, evidence of further deterioration in LVEF by echo, restrictive filling.  Assessment & Plan    Continue IV diuretics another 24 h. Interventional Cardiology reviewed angiograms - no safe options for PCI. Continue dual antiplatelet therapy, statin, beta blocker and long acting nitrates.     For questions or updates, please contact [facility] Please consult [email or web address] for contact info under        Signed, Sanda Klein, MD  03/13/2019, 9:56 AM

## 2019-03-14 LAB — BASIC METABOLIC PANEL
Anion gap: 7 (ref 5–15)
BUN: 39 mg/dL — ABNORMAL HIGH (ref 8–23)
CO2: 22 mmol/L (ref 22–32)
Calcium: 9.1 mg/dL (ref 8.9–10.3)
Chloride: 110 mmol/L (ref 98–111)
Creatinine, Ser: 1.51 mg/dL — ABNORMAL HIGH (ref 0.44–1.00)
GFR calc Af Amer: 41 mL/min — ABNORMAL LOW (ref >60)
GFR calc non Af Amer: 35 mL/min — ABNORMAL LOW (ref >60)
Glucose, Bld: 130 mg/dL — ABNORMAL HIGH (ref 70–99)
Potassium: 4.6 mmol/L (ref 3.5–5.1)
Sodium: 139 mmol/L (ref 135–145)

## 2019-03-14 LAB — GLUCOSE, CAPILLARY
Glucose-Capillary: 125 mg/dL — ABNORMAL HIGH (ref 70–99)
Glucose-Capillary: 139 mg/dL — ABNORMAL HIGH (ref 70–99)
Glucose-Capillary: 160 mg/dL — ABNORMAL HIGH (ref 70–99)
Glucose-Capillary: 182 mg/dL — ABNORMAL HIGH (ref 70–99)
Glucose-Capillary: 186 mg/dL — ABNORMAL HIGH (ref 70–99)
Glucose-Capillary: 201 mg/dL — ABNORMAL HIGH (ref 70–99)
Glucose-Capillary: 222 mg/dL — ABNORMAL HIGH (ref 70–99)

## 2019-03-14 MED ORDER — INSULIN GLARGINE 100 UNIT/ML ~~LOC~~ SOLN
20.0000 [IU] | Freq: Every day | SUBCUTANEOUS | Status: DC
  Administered 2019-03-14 – 2019-03-15 (×2): 20 [IU] via SUBCUTANEOUS
  Filled 2019-03-14 (×2): qty 0.2

## 2019-03-14 MED ORDER — ALPRAZOLAM 0.25 MG PO TABS: 0.2500 mg | ORAL_TABLET | Freq: Once | ORAL | Status: DC

## 2019-03-14 MED ORDER — FUROSEMIDE 40 MG PO TABS
40.0000 mg | ORAL_TABLET | Freq: Every day | ORAL | Status: DC
  Administered 2019-03-14 – 2019-03-15 (×2): 40 mg via ORAL
  Filled 2019-03-14 (×2): qty 1

## 2019-03-14 NOTE — Progress Notes (Signed)
Progress Note  Patient Name: [name] [name] Date of Encounter: 03/14/2019  Primary Cardiologist: Shelva Majestic, MD   Subjective   No dyspnea, marked reduction in edema. Had a bad night due to a noisy appliance next to her room that prevented sleep. Now in a different room. Output was not fully captured (she went to restroom).  Inpatient Medications    Scheduled Meds: . ALPRAZolam  0.25 mg Oral Once  . aspirin EC  81 mg Oral Daily  . atorvastatin  80 mg Oral QHS  . clopidogrel  75 mg Oral Daily  . furosemide  40 mg Intravenous BID  . heparin  5,000 Units Subcutaneous Q8H  . insulin aspart  0-9 Units Subcutaneous Q4H  . isosorbide mononitrate  120 mg Oral Daily  . metoprolol succinate  75 mg Oral Daily  . pantoprazole  40 mg Oral Daily  . sodium chloride flush  3 mL Intravenous Once  . sodium chloride flush  3 mL Intravenous Q12H   Continuous Infusions: . sodium chloride     PRN Meds: sodium chloride, acetaminophen **OR** acetaminophen, albuterol, gabapentin, hydrALAZINE, HYDROcodone-acetaminophen, nitroGLYCERIN, sodium chloride flush   Vital Signs    Vitals:   03/13/19 2030 03/13/19 2309 03/14/19 0500 03/14/19 0823  BP: (!) 128/59 140/65  (!) 144/65  Pulse: 81 80  81  Resp:  18  16  Temp: 98.1 F (36.7 C) 98 F (36.7 C)  97.8 F (36.6 C)  TempSrc: Oral Oral    SpO2: 99% 98%  95%  Weight:   75.5 kg   Height:        Intake/Output Summary (Last 24 hours) at 03/14/2019 1213 Last data filed at 03/14/2019 1200 Gross per 24 hour  Intake 480 ml  Output 651 ml  Net -171 ml   Last 3 Weights 03/14/2019 03/12/2019 05/12/2018  Weight (lbs) 166 lb 7.2 oz 166 lb 3.6 oz 185 lb 13.6 oz  Weight (kg) 75.5 kg 75.4 kg 84.3 kg      Telemetry    NSR - Personally Reviewed  ECG    No new tracing - Personally Reviewed  Physical Exam  Comfortable at 30 deg HOB GEN: No acute distress.   Neck: No JVD Cardiac: RRR, no murmurs, rubs, or gallops.  Respiratory: Clear to  auscultation bilaterally. GI: Soft, nontender, non-distended  MS: No edema; wrinkled calves. No deformity. Neuro:  Nonfocal  Psych: Normal affect   Labs    High Sensitivity Troponin:   Recent Labs  Lab 03/11/19 1823 03/11/19 2030 03/12/19 0344  TROPONINIHS 123* 110* 115*      Chemistry Recent Labs  Lab 03/12/19 0344 03/13/19 0718 03/14/19 0736  NA 140 142 139  K 4.1 4.4 4.6  CL 105 108 110  CO2 23 21* 22  GLUCOSE 129* 136* 130*  BUN 33* 36* 39*  CREATININE 1.16* 1.24* 1.51*  CALCIUM 9.0 9.2 9.1  PROT 6.7  --   --   ALBUMIN 3.4*  --   --   AST 24  --   --   ALT 24  --   --   ALKPHOS 97  --   --   BILITOT 0.8  --   --   GFRNONAA 48* 45* 35*  GFRAA 56* 52* 41*  ANIONGAP 12 13 7      Hematology Recent Labs  Lab 03/11/19 1823 03/12/19 0344 03/13/19 0718  WBC 9.2 9.7 8.7  RBC 3.60* 3.39* 3.46*  HGB 10.1* 9.3* 9.6*  HCT 31.0* 29.0*  29.5*  MCV 86.1 85.5 85.3  MCH 28.1 27.4 27.7  MCHC 32.6 32.1 32.5  RDW 14.5 14.6 14.8  PLT 310 288 269    BNP Recent Labs  Lab 03/11/19 1823  BNP 2,460.7*     DDimer No results for input(s): DDIMER in the last 168 hours.   Radiology    No results found.  Cardiac Studies   ECHO 03/13/2019 1. Diffuse hypokinesis worse in the septum and inferior base. Left  ventricular ejection fraction, by estimation, is 25 to 30%. The left  ventricle has moderate to severely decreased function. The left ventricle  demonstrates global hypokinesis. The left  ventricular internal cavity size was moderately dilated. Left ventricular  diastolic parameters are consistent with Grade III diastolic dysfunction  (restrictive). Elevated left ventricular end-diastolic pressure.  2. Right ventricular systolic function is normal. The right ventricular  size is normal. There is moderately elevated pulmonary artery systolic  pressure.  3. Left atrial size was moderately dilated.  4. Right atrial size was mildly dilated.  5. The mitral  valve is normal in structure and function. Mild mitral  valve regurgitation. No evidence of mitral stenosis.  6. The aortic valve is normal in structure and function. Aortic valve  regurgitation is not visualized. No aortic stenosis is present.    Patient Profile     69 y.o. female with severe multivessel CAD, not a candidate for surgical or percutaneous revascularization, presents with unstable angina due to acute on chronic HF, gradual decompensation, evidence of further deterioration in LVEF by echo, restrictive filling.   Assessment & Plan    BUN and creat increased. Appears euvolemic. Switch to PO diuretics, monitor 24 h. Continue dual antiplatelet therapy, statin, beta blocker and long acting nitrates.     For questions or updates, please contact [facility] Please consult [email or web address] for contact info under        Signed, Thurmon Fair, MD  03/14/2019, 12:13 PM

## 2019-03-14 NOTE — Progress Notes (Signed)
Progress Note    [name] [name]  VQQ:[id number] DOB: 09-03-1950  DOA: 03/11/2019 PCP: Georgann Housekeeper, MD    Brief Narrative:    Medical records reviewed and are as summarized below:  [name] [name] is an 69 y.o. 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  Substernal aching, without radiation, which occurred at rest. Pt states relief with nitroglycerin after a few minutes.    Assessment/Plan:   Principal Problem:   Acute on chronic combined systolic and diastolic CHF (congestive heart failure) (HCC) Active Problems:   Uncontrolled type 2 diabetes mellitus with complication (HCC)   Acute systolic CHF (congestive heart failure) (HCC)   Dyspnea   Elevated troponin   Chest pain   Dyspnea due to Acute on Chronic Systolic/ Diastolic CHF -echo: Diffuse hypokinesis worse in the septum and inferior base. Left ventricular ejection fraction, by estimation, is 25 to 30%. The left  ventricle has moderate to severely decreased function. The left ventricle demonstrates global hypokinesis. -Cont Troprol XL 75mg  po qday -Lasix 40mg  IV BID -cardiology consult appreciated: change to PO lasix and check labs in AM  Chest pain, Elevated troponin, h/o CAD w NSTEMI 04/2018 -LDL 101 -aspirin/ Plavix  -Increase Imdur 60mg  po qday-> 120mg  po qday  Cont Toprol XL as above Cont Lipitor 80mg  po qhs Cardiology consult appreciated  Hypertension  -BP in L is higher than right -Subclavians: Right subclavian artery flow was disturbed. Normal flow hemodynamics were seen in the left subclavian artery. Plaque  noted proximally on the right  Dm2 -SSI -add low dose lantus (home med) -d/c metformin while in hospital  Anemia (chronic) -trend CBC  Gerd Cont PPI  CKD stage  IIIb -monitor   Family Communication/Anticipated D/C date and plan/Code Status   DVT prophylaxis: heparin Code Status: Full Code.  Family Communication:  Disposition Plan: home in AM?   Medical Consultants:    cards     Subjective:   Not sleep well last night to noise  Objective:    Vitals:   03/13/19 2030 03/13/19 2309 03/14/19 0500 03/14/19 0823  BP: (!) 128/59 140/65  (!) 144/65  Pulse: 81 80  81  Resp:  18  16  Temp: 98.1 F (36.7 C) 98 F (36.7 C)  97.8 F (36.6 C)  TempSrc: Oral Oral    SpO2: 99% 98%  95%  Weight:   75.5 kg   Height:        Intake/Output Summary (Last 24 hours) at 03/14/2019 1219 Last data filed at 03/14/2019 1200 Gross per 24 hour  Intake 480 ml  Output 651 ml  Net -171 ml   Filed Weights   03/12/19 0547 03/14/19 0500  Weight: 75.4 kg 75.5 kg    Exam: In bed, no acute distress Regular rate and rhythm No increased work of breathing, no wheezing Positive bowel sounds soft nontender Alert and oriented x3  Data Reviewed:   I have personally reviewed following labs and imaging studies:  Labs: Labs show the following:   Basic Metabolic Panel: Recent Labs  Lab 03/11/19 1823 03/11/19 1823 03/11/19 2213 03/12/19 0344 03/12/19 0344 03/13/19 0718 03/14/19 0736  NA 139  --   --  140  --  142 139  K 5.0   < >  --  4.1   < > 4.4 4.6  CL 105  --   --  105  --  108 110  CO2 21*  --   --  23  --  21* 22  GLUCOSE 157*  --   --  129*  --  136* 130*  BUN 35*  --   --  33*  --  36* 39*  CREATININE 1.34*  --  1.31* 1.16*  --  1.24* 1.51*  CALCIUM 9.1  --   --  9.0  --  9.2 9.1  MG  --   --   --  1.6*  --   --   --    < > = values in this interval not displayed.   GFR Estimated Creatinine Clearance: 35.5 mL/min (A) (by C-G formula based on SCr of 1.51 mg/dL (H)). Liver Function Tests: Recent Labs  Lab 03/12/19 0344  AST 24  ALT 24  ALKPHOS 97  BILITOT 0.8  PROT 6.7  ALBUMIN 3.4*   No results for input(s): LIPASE,  AMYLASE in the last 168 hours. No results for input(s): AMMONIA in the last 168 hours. Coagulation profile No results for input(s): INR, PROTIME in the last 168 hours.  CBC: Recent Labs  Lab 03/11/19 1823 03/12/19 0344 03/13/19 0718  WBC 9.2 9.7 8.7  HGB 10.1* 9.3* 9.6*  HCT 31.0* 29.0* 29.5*  MCV 86.1 85.5 85.3  PLT 310 288 269   Cardiac Enzymes: No results for input(s): CKTOTAL, CKMB, CKMBINDEX, TROPONINI in the last 168 hours. BNP (last 3 results) No results for input(s): PROBNP in the last 8760 hours. CBG: Recent Labs  Lab 03/13/19 2218 03/14/19 0014 03/14/19 0341 03/14/19 0820 03/14/19 1201  GLUCAP 214* 182* 125* 139* 222*   D-Dimer: No results for input(s): DDIMER in the last 72 hours. Hgb A1c: No results for input(s): HGBA1C in the last 72 hours. Lipid Profile: Recent Labs    03/12/19 0344  CHOL 162  HDL 26*  LDLCALC 101*  TRIG 176*  CHOLHDL 6.2   Thyroid function studies: No results for input(s): TSH, T4TOTAL, T3FREE, THYROIDAB in the last 72 hours.  Invalid input(s): FREET3 Anemia work up: No results for input(s): VITAMINB12, FOLATE, FERRITIN, TIBC, IRON, RETICCTPCT in the last 72 hours. Sepsis Labs: Recent Labs  Lab 03/11/19 1823 03/12/19 0344 03/13/19 0718  WBC 9.2 9.7 8.7    Microbiology Recent Results (from the past 240 hour(s))  SARS CORONAVIRUS 2 (TAT 6-24 HRS) Nasopharyngeal Nasopharyngeal Swab     Status: None   Collection Time: 03/11/19  9:32 PM   Specimen: Nasopharyngeal Swab  Result Value Ref Range Status   SARS Coronavirus 2 NEGATIVE NEGATIVE Final    Comment: (NOTE) SARS-CoV-2 target nucleic acids are NOT DETECTED. The SARS-CoV-2 RNA is generally detectable in upper and lower respiratory specimens during the acute phase of infection. Negative results do not preclude SARS-CoV-2 infection, do not rule out co-infections with other pathogens, and should not be used as the sole basis for treatment or other patient management  decisions. Negative results must be combined with clinical observations, patient history, and epidemiological information. The expected result is Negative. Fact Sheet for Patients: SugarRoll.be Fact Sheet for Healthcare Providers: https://www.woods-mathews.com/ This test is not yet approved or cleared by the [place] FDA and  has been authorized for detection and/or diagnosis of SARS-CoV-2 by FDA under an Emergency Use Authorization (EUA). This EUA will remain  in effect (meaning this test can be used) for the duration of  the COVID-19 declaration under Section 56 4(b)(1) of the Act, 21 U.S.C. section 360bbb-3(b)(1), unless the authorization is terminated or revoked sooner. Performed at [facility], [address], [place], [place] [address]     Procedures and diagnostic studies:  No results found.  Medications:   . ALPRAZolam  0.25 mg Oral Once  . aspirin EC  81 mg Oral Daily  . atorvastatin  80 mg Oral QHS  . clopidogrel  75 mg Oral Daily  . furosemide  40 mg Oral Daily  . heparin  5,000 Units Subcutaneous Q8H  . insulin aspart  0-9 Units Subcutaneous Q4H  . isosorbide mononitrate  120 mg Oral Daily  . metoprolol succinate  75 mg Oral Daily  . pantoprazole  40 mg Oral Daily  . sodium chloride flush  3 mL Intravenous Once  . sodium chloride flush  3 mL Intravenous Q12H   Continuous Infusions: . sodium chloride       LOS: 2 days   Joseph Art  [facility]   How to contact the [facility] Attending or Consulting provider 7A - 7P or covering provider during after hours 7P -7A, for this patient?  1. Check the care team in [facility] and look for a) attending/consulting [facility] provider listed and b) the [facility] team listed 2. Log into [email or web address] and use [place]'s universal password to access. If you do not have the password, please contact the hospital operator. 3. Locate the [facility] provider you are looking for under [facility] and page to a number that you can be directly reached. 4. If you still have difficulty reaching the provider, please page the [facility] (Director on Call) for the Hospitalists listed on amion for assistance.  03/14/2019, 12:19 PM

## 2019-03-15 LAB — BASIC METABOLIC PANEL
Anion gap: 11 (ref 5–15)
BUN: 43 mg/dL — ABNORMAL HIGH (ref 8–23)
CO2: 23 mmol/L (ref 22–32)
Calcium: 9.2 mg/dL (ref 8.9–10.3)
Chloride: 105 mmol/L (ref 98–111)
Creatinine, Ser: 1.45 mg/dL — ABNORMAL HIGH (ref 0.44–1.00)
GFR calc Af Amer: 43 mL/min — ABNORMAL LOW (ref >60)
GFR calc non Af Amer: 37 mL/min — ABNORMAL LOW (ref >60)
Glucose, Bld: 133 mg/dL — ABNORMAL HIGH (ref 70–99)
Potassium: 4.5 mmol/L (ref 3.5–5.1)
Sodium: 139 mmol/L (ref 135–145)

## 2019-03-15 LAB — GLUCOSE, CAPILLARY
Glucose-Capillary: 127 mg/dL — ABNORMAL HIGH (ref 70–99)
Glucose-Capillary: 135 mg/dL — ABNORMAL HIGH (ref 70–99)

## 2019-03-15 MED ORDER — FUROSEMIDE 40 MG PO TABS: 40.0000 mg | ORAL_TABLET | Freq: Every day | ORAL | 0 refills | Status: DC

## 2019-03-15 MED ORDER — ISOSORBIDE MONONITRATE ER 120 MG PO TB24: 120.0000 mg | ORAL_TABLET | Freq: Every day | ORAL | 0 refills | Status: DC

## 2019-03-15 NOTE — Discharge Instructions (Signed)
Heart Failure Action Plan A heart failure action plan helps you understand what to do when you have symptoms of heart failure. Follow the plan that was created by you and your health care provider. Review your plan each time you visit your health care provider. Red zone These signs and symptoms mean you should get medical help right away:  You have trouble breathing when resting.  You have a dry cough that is getting worse.  You have swelling or pain in your legs or abdomen that is getting worse.  You suddenly gain more than 2-3 lb (0.9-1.4 kg) in a day, or more than 5 lb (2.3 kg) in one week. This amount may be more or less depending on your condition.  You have trouble staying awake or you feel confused.  You have chest pain.  You do not have an appetite.  You pass out. If you experience any of these symptoms:  Call your local emergency services (911 in the [place] right away or seek help at the emergency department of the nearest hospital. Yellow zone These signs and symptoms mean your condition may be getting worse and you should make some changes:  You have trouble breathing when you are active or you need to sleep with extra pillows.  You have swelling in your legs or abdomen.  You gain 2-3 lb (0.9-1.4 kg) in one day, or 5 lb (2.3 kg) in one week. This amount may be more or less depending on your condition.  You get tired easily.  You have trouble sleeping.  You have a dry cough. If you experience any of these symptoms:  Contact your health care provider within the next day.  Your health care provider may adjust your medicines. Green zone These signs mean you are doing well and can continue what you are doing:  You do not have shortness of breath.  You have very little swelling or no new swelling.  Your weight is stable (no gain or loss).  You have a normal activity level.  You do not have chest pain or any other new symptoms. Follow these instructions at  home:  Take over-the-counter and prescription medicines only as told by your health care provider.  Weigh yourself daily. Your target weight is __________ lb (__________ kg). ? Call your health care provider if you gain more than __________ lb (__________ kg) in a day, or more than __________ lb (__________ kg) in one week.  Eat a heart-healthy diet. Work with a diet and nutrition specialist (dietitian) to create an eating plan that is best for you.  Keep all follow-up visits as told by your health care provider. This is important. Where to find more information  American Heart Association: www.heart.org Summary  Follow the action plan that was created by you and your health care provider.  Get help right away if you have any symptoms in the Red zone. This information is not intended to replace advice given to you by your health care provider. Make sure you discuss any questions you have with your health care provider. Document Revised: 12/27/2016 Document Reviewed: 02/24/2016 Elsevier Patient Education  2020 Elsevier Inc.  

## 2019-03-15 NOTE — TOC Transition Note (Signed)
Transition of Care [facility]) - CM/SW Discharge Note   Patient Details  Name: [name] [name] MRN: [id number] Date of Birth: 1950-09-22  Transition of Care [facility]) CM/SW Contact:  Kermit Balo, RN Phone Number: 03/15/2019, 11:04 AM   Clinical Narrative:    Pt discharging home with self care. Pt did not qualify for home oxygen with walking sats.  Pt has hospital f/u and transportation home.   Final next level of care: Home/Self Care Barriers to Discharge: No Barriers Identified   Patient Goals and CMS Choice        Discharge Placement                       Discharge Plan and Services                                     Social Determinants of Health (SDOH) Interventions     Readmission Risk Interventions No flowsheet data found.

## 2019-03-15 NOTE — Progress Notes (Signed)
SATURATION QUALIFICATIONS: (This note is used to comply with regulatory documentation for home oxygen)  Patient Saturations on Room Air at Rest = 100%  Patient Saturations on Room Air while Ambulating = 100%  Patient Saturations on N/A Liters of oxygen while Ambulating = N/A

## 2019-03-15 NOTE — Progress Notes (Signed)
Discharge instructions  Given with stated understanding.  Patient waiting for transportation home at this time

## 2019-03-15 NOTE — Consult Note (Signed)
   [facility] CM Inpatient Consult   03/15/2019  [name] [name] Jul 17, 1950 [id number]    Patientwas evaluated for 17%risk score forunplanned readmission andhospitalization; and to check for potential needs of [facility]) care management servicesunder her Medicare/ NextGeninsuranceplan.  Patient had been outreached by [facility] RN CM in the past, but had difficulty establishing contact with patient.  Chart review and MD notes show as:  [name] a68 y.o.female,w gerd, anemia, hypertension, hyperlipidemia, Dm2, CKD stage3b, PVD, h/o stroke,100% occlusion R ICA, 60% stenosis R subclavian, and 65% stenosis L subclavian artery, CAD, NSTEMI April 2020, CHF (EF 45%),  presented with unstable angina due to acute on chronic HF, gradual decompensation, evidence of further deterioration in LVEF by echo, restrictive filling.  Patient'sprimary care provider isDr. Georgann Housekeeper with [facility], listed to provide transition of care follow-up.  Attempts to call patient in the room twice but no response. Called patient's mobile number but unsuccessful and was unable to leave message for mailbox was full. Called to the nurses' station to be connected to patient but was informed of patient's discharge to home.  Patient transitionedto home prior to speakingtoher. Patient will benefit from EMMIfollow-upcalls tocheck her post discharge.    For questionsand referral,please call:   Karin Golden A. Breniyah Romm, BSN, RN-BC [facility] Liaison Cell: [phone]

## 2019-03-15 NOTE — Plan of Care (Signed)
  Problem: Education: Goal: Ability to demonstrate management of disease process will improve Outcome: Adequate for Discharge Goal: Individualized Educational Video(s) Outcome: Adequate for Discharge   Problem: Activity: Goal: Capacity to carry out activities will improve Outcome: Adequate for Discharge   Problem: Cardiac: Goal: Ability to achieve and maintain adequate cardiopulmonary perfusion will improve Outcome: Adequate for Discharge

## 2019-03-15 NOTE — Discharge Summary (Signed)
Physician Discharge Summary  ZYRIA FISCUS BDZ:[id number] DOB: 1950/10/28 DOA: 03/11/2019  PCP: Wenda Low, MD  Admit date: 03/11/2019 Discharge date: 03/15/2019  Admitted From: Home Discharge disposition: Home   Recommendations for Outpatient Follow-Up:   1. BMP 1 week 2. Encourage patient to be compliant with diet and daily weights   Discharge Diagnosis:   Principal Problem:   Acute on chronic combined systolic and diastolic CHF (congestive heart failure) (HCC) Active Problems:   Uncontrolled type 2 diabetes mellitus with complication (HCC)   Acute systolic CHF (congestive heart failure) (HCC)   Dyspnea   Elevated troponin   Chest pain    Discharge Condition: Improved.  Diet recommendation: Low sodium, heart healthy.  Carbohydrate-modified Wound care: None.  Code status: Full.   History of Present Illness:   [name] [name]  is a 69 y.o. female,  w gerd, anemia, hypertension, hyperlipidemia, Dm2, CKD stage3b, PVD, h/o stroke,100% occlusion R ICA,  60% stenosis R subclavian, and 65% stenosis L subclavian artery,  CAD, NSTEMI April 2020, CHF (EF 45%),  Presents with c/o chest pain , last 2 days ago.  Substernal aching, without radiation, which occurred at rest. Pt states relief with nitroglycerin after a few minutes.   Pt notes dyspnea x 1 week. Worse with exertion. Pt has chronic dyspnea.   Hospital Course by Problem:   Acute on chronic combined heart failure -Large amount of diuresis/strict I's/O's were not kept -renal function and electrolytes stable  -appears euvolemic on exam -continue beta blocker -Patient was not following a low-salt diet at home so will leave Lasix at 40 mg daily with a BMP in 1 week -Patient to weigh daily  Severe multivessel CAD -not a candidate for surgical or percutaneous intervention -continue DAPT, statin, beta blocker and long acting nitrates    Dm2 -Resume home meds  Anemia (chronic) -Monitor outpatient  Gerd Cont  PPI  CKD stage IIIb -monitor outpatient  Hyperlipidemia -On Lipitor -LDL 101    Medical Consultants:    Cardiology  Discharge Exam:   Vitals:   03/14/19 2217 03/15/19 0812  BP: 138/68 (!) 150/75  Pulse: 71 84  Resp: 16 19  Temp: 97.6 F (36.4 C) (!) 97.5 F (36.4 C)  SpO2: 95% 98%   Vitals:   03/14/19 1717 03/14/19 2217 03/15/19 0500 03/15/19 0812  BP: 122/65 138/68  (!) 150/75  Pulse: 71 71  84  Resp: 16 16  19   Temp: 98.2 F (36.8 C) 97.6 F (36.4 C)  (!) 97.5 F (36.4 C)  TempSrc: Oral Oral    SpO2: 98% 95%  98%  Weight:   74.7 kg   Height:        General exam: Appears calm and comfortable.    The results of significant diagnostics from this hospitalization (including imaging, microbiology, ancillary and laboratory) are listed below for reference.     Procedures and Diagnostic Studies:   DG Chest 2 View  Result Date: 03/11/2019 CLINICAL DATA:  Chest pain EXAM: CHEST - 2 VIEW COMPARISON:  May 08, 2018 FINDINGS: The heart size and mediastinal contours are unchanged with mild prominence. Aortic knob calcifications. There is mildly increased interstitial markings seen throughout both lungs. No large airspace consolidation or pleural effusion. No acute osseous abnormality. Again noted is a loose body around the right shoulder. IMPRESSION: Mildly increased interstitial markings throughout both lungs which could be due to interstitial edema. Electronically Signed   By: Prudencio Pair M.D.   On: 03/11/2019  18:53   ECHOCARDIOGRAM COMPLETE  Result Date: 03/12/2019    ECHOCARDIOGRAM REPORT   Patient Name:   [name] [name] Date of Exam: 03/12/2019 Medical Rec #:  [id number]    Height:       64.0 in Accession #:    [id number]   Weight:       166.2 lb Date of Birth:  February 28, 1950    BSA:          1.81 m Patient Age:    68 years     BP:           92/67 mmHg Patient Gender: F            HR:           96 bpm. Exam Location:  Inpatient Procedure: 2D Echo, Cardiac Doppler and  Color Doppler Indications:    CHF-Acute Systolic 428.21  History:        Patient has prior history of Echocardiogram examinations, most                 recent 05/09/2018. CHF, CAD and Previous Myocardial Infarction,                 Stroke, Signs/Symptoms:Hypotension and Dyspnea; Risk                 Factors:Diabetes, Dyslipidemia and Former Smoker. PVD. PAD.  Sonographer:    Renella Cunas RDCS Referring Phys: [id number] JAMES KIM IMPRESSIONS  1. Diffuse hypokinesis worse in the septum and inferior base. Left ventricular ejection fraction, by estimation, is 25 to 30%. The left ventricle has moderate to severely decreased function. The left ventricle demonstrates global hypokinesis. The left ventricular internal cavity size was moderately dilated. Left ventricular diastolic parameters are consistent with Grade III diastolic dysfunction (restrictive). Elevated left ventricular end-diastolic pressure.  2. Right ventricular systolic function is normal. The right ventricular size is normal. There is moderately elevated pulmonary artery systolic pressure.  3. Left atrial size was moderately dilated.  4. Right atrial size was mildly dilated.  5. The mitral valve is normal in structure and function. Mild mitral valve regurgitation. No evidence of mitral stenosis.  6. The aortic valve is normal in structure and function. Aortic valve regurgitation is not visualized. No aortic stenosis is present. FINDINGS  Left Ventricle: Diffuse hypokinesis worse in the septum and inferior base. Left ventricular ejection fraction, by estimation, is 25 to 30%. The left ventricle has moderate to severely decreased function. The left ventricle demonstrates global hypokinesis. The left ventricular internal cavity size was moderately dilated. There is no left ventricular hypertrophy. Left ventricular diastolic parameters are consistent with Grade III diastolic dysfunction (restrictive). Elevated left ventricular end-diastolic pressure. Right Ventricle:  The right ventricular size is normal. No increase in right ventricular wall thickness. Right ventricular systolic function is normal. There is moderately elevated pulmonary artery systolic pressure. The tricuspid regurgitant velocity is 2.94 m/s, and with an assumed right atrial pressure of 15 mmHg, the estimated right ventricular systolic pressure is 49.6 mmHg. Left Atrium: Left atrial size was moderately dilated. Right Atrium: Right atrial size was mildly dilated. Pericardium: There is no evidence of pericardial effusion. Mitral Valve: The mitral valve is normal in structure and function. There is mild thickening of the mitral valve leaflet(s). There is mild calcification of the mitral valve leaflet(s). Normal mobility of the mitral valve leaflets. Mild to moderate mitral  annular calcification. Mild mitral valve regurgitation. No evidence of mitral valve stenosis. Tricuspid Valve: The tricuspid  valve is normal in structure. Tricuspid valve regurgitation is mild . No evidence of tricuspid stenosis. Aortic Valve: The aortic valve is normal in structure and function. Aortic valve regurgitation is not visualized. No aortic stenosis is present. Pulmonic Valve: The pulmonic valve was normal in structure. Pulmonic valve regurgitation is not visualized. No evidence of pulmonic stenosis. Aorta: The aortic root is normal in size and structure. IAS/Shunts: No atrial level shunt detected by color flow Doppler.  LEFT VENTRICLE PLAX 2D LVIDd:         5.55 cm      Diastology LVIDs:         4.59 cm      LV e' lateral:   5.50 cm/s LV PW:         0.85 cm      LV E/e' lateral: 20.0 LV IVS:        0.86 cm      LV e' medial:    2.81 cm/s LVOT diam:     1.90 cm      LV E/e' medial:  39.1 LV SV:         34.31 ml LV SV Index:   28.76 LVOT Area:     2.84 cm  LV Volumes (MOD) LV vol d, MOD A2C: 166.0 ml LV vol d, MOD A4C: 158.0 ml LV vol s, MOD A2C: 101.0 ml LV vol s, MOD A4C: 116.0 ml LV SV MOD A2C:     65.0 ml LV SV MOD A4C:     158.0  ml LV SV MOD BP:      54.6 ml RIGHT VENTRICLE RV S prime:     10.30 cm/s TAPSE (M-mode): 1.9 cm LEFT ATRIUM             Index       RIGHT ATRIUM           Index LA diam:        4.70 cm 2.60 cm/m  RA Area:     12.70 cm LA Vol (A2C):   56.5 ml 31.24 ml/m RA Volume:   29.60 ml  16.37 ml/m LA Vol (A4C):   36.8 ml 20.35 ml/m LA Biplane Vol: 48.1 ml 26.60 ml/m  AORTIC VALVE LVOT Vmax:   58.00 cm/s LVOT Vmean:  37.700 cm/s LVOT VTI:    0.121 m  AORTA Ao Root diam: 2.40 cm MITRAL VALVE                TRICUSPID VALVE MV Area (PHT): 5.31 cm     TR Peak grad:   34.6 mmHg MV Decel Time: 143 msec     TR Vmax:        294.00 cm/s MV E velocity: 110.00 cm/s MV A velocity: 66.00 cm/s   SHUNTS MV E/A ratio:  1.67         Systemic VTI:  0.12 m                             Systemic Diam: 1.90 cm Charlton Haws MD Electronically signed by Charlton Haws MD Signature Date/Time: 03/12/2019/11:23:45 AM    Final      Labs:   Basic Metabolic Panel: Recent Labs  Lab 03/11/19 1823 03/11/19 1823 03/11/19 2213 03/12/19 0344 03/12/19 0344 03/13/19 0718 03/13/19 0718 03/14/19 0736 03/15/19 0402  NA 139  --   --  140  --  142  --  139 139  K 5.0   < >  --  4.1   < > 4.4   < > 4.6 4.5  CL 105  --   --  105  --  108  --  110 105  CO2 21*  --   --  23  --  21*  --  22 23  GLUCOSE 157*  --   --  129*  --  136*  --  130* 133*  BUN 35*  --   --  33*  --  36*  --  39* 43*  CREATININE 1.34*   < > 1.31* 1.16*  --  1.24*  --  1.51* 1.45*  CALCIUM 9.1  --   --  9.0  --  9.2  --  9.1 9.2  MG  --   --   --  1.6*  --   --   --   --   --    < > = values in this interval not displayed.   GFR Estimated Creatinine Clearance: 36.8 mL/min (A) (by C-G formula based on SCr of 1.45 mg/dL (H)). Liver Function Tests: Recent Labs  Lab 03/12/19 0344  AST 24  ALT 24  ALKPHOS 97  BILITOT 0.8  PROT 6.7  ALBUMIN 3.4*   No results for input(s): LIPASE, AMYLASE in the last 168 hours. No results for input(s): AMMONIA in the last 168  hours. Coagulation profile No results for input(s): INR, PROTIME in the last 168 hours.  CBC: Recent Labs  Lab 03/11/19 1823 03/12/19 0344 03/13/19 0718  WBC 9.2 9.7 8.7  HGB 10.1* 9.3* 9.6*  HCT 31.0* 29.0* 29.5*  MCV 86.1 85.5 85.3  PLT 310 288 269   Cardiac Enzymes: No results for input(s): CKTOTAL, CKMB, CKMBINDEX, TROPONINI in the last 168 hours. BNP: Invalid input(s): POCBNP CBG: Recent Labs  Lab 03/14/19 1715 03/14/19 2005 03/14/19 2351 03/15/19 0404 03/15/19 0744  GLUCAP 186* 201* 160* 127* 135*   D-Dimer No results for input(s): DDIMER in the last 72 hours. Hgb A1c No results for input(s): HGBA1C in the last 72 hours. Lipid Profile No results for input(s): CHOL, HDL, LDLCALC, TRIG, CHOLHDL, LDLDIRECT in the last 72 hours. Thyroid function studies No results for input(s): TSH, T4TOTAL, T3FREE, THYROIDAB in the last 72 hours.  Invalid input(s): FREET3 Anemia work up No results for input(s): VITAMINB12, FOLATE, FERRITIN, TIBC, IRON, RETICCTPCT in the last 72 hours. Microbiology Recent Results (from the past 240 hour(s))  SARS CORONAVIRUS 2 (TAT 6-24 HRS) Nasopharyngeal Nasopharyngeal Swab     Status: None   Collection Time: 03/11/19  9:32 PM   Specimen: Nasopharyngeal Swab  Result Value Ref Range Status   SARS Coronavirus 2 NEGATIVE NEGATIVE Final    Comment: (NOTE) SARS-CoV-2 target nucleic acids are NOT DETECTED. The SARS-CoV-2 RNA is generally detectable in upper and lower respiratory specimens during the acute phase of infection. Negative results do not preclude SARS-CoV-2 infection, do not rule out co-infections with other pathogens, and should not be used as the sole basis for treatment or other patient management decisions. Negative results must be combined with clinical observations, patient history, and epidemiological information. The expected result is Negative. Fact Sheet for Patients: HairSlick.no Fact Sheet  for Healthcare Providers: quierodirigir.com This test is not yet approved or cleared by the [place] FDA and  has been authorized for detection and/or diagnosis of SARS-CoV-2 by FDA under an Emergency Use Authorization (EUA). This EUA will remain  in effect (meaning this test can be used) for the duration of the COVID-19 declaration  under Section 56 4(b)(1) of the Act, 21 U.S.C. section 360bbb-3(b)(1), unless the authorization is terminated or revoked sooner. Performed at [facility], [address] [place], [place] [address]      Discharge Instructions:   Discharge Instructions    (HEART FAILURE PATIENTS) Call MD:  Anytime you have any of the following symptoms: 1) 3 pound weight gain in 24 hours or 5 pounds in 1 week 2) shortness of breath, with or without a dry hacking cough 3) swelling in the hands, feet or stomach 4) if you have to sleep on extra pillows at night in order to breathe.   Complete by: As directed    Diet - low sodium heart healthy   Complete by: As directed    Diet Carb Modified   Complete by: As directed    Discharge instructions   Complete by: As directed    BMP 1 week   Increase activity slowly   Complete by: As directed      Allergies as of 03/15/2019      Reactions   Doxycycline Other (See Comments)   Lethargy   Hydrochlorothiazide Other (See Comments)   Lethargy   Latex Rash   Penicillins Swelling, Rash   Pt states she has tolerated Keflex in the past without problems. States she may have tolerated Augmentin in the past but it caused GI upset. Has patient had a PCN reaction causing immediate rash, facial/tongue/throat swelling, SOB or lightheadedness with hypotension: Yes Has patient had a PCN reaction causing severe rash involving mucus membranes or skin necrosis: No Has patient had a PCN reaction that required hospitalization No Has patient had a PCN reaction occurring within the last 10 years: No        Medication List    TAKE these medications   acetaminophen 325 MG tablet Commonly known as: TYLENOL Take 650 mg by mouth every 6 (six) hours as needed for mild pain, fever or headache.   aspirin EC 81 MG tablet Take 81 mg by mouth daily.   atorvastatin 80 MG tablet Commonly known as: LIPITOR Take 80 mg by mouth at bedtime.   clopidogrel 75 MG tablet Commonly known as: PLAVIX Take 75 mg by mouth daily.   furosemide 40 MG tablet Commonly known as: LASIX Take 1 tablet (40 mg total) by mouth daily. What changed: when to take this   gabapentin 300 MG capsule Commonly known as: NEURONTIN Take 1 capsule (300 mg total) by mouth at bedtime. What changed:   when to take this  reasons to take this   isosorbide mononitrate 120 MG 24 hr tablet Commonly known as: IMDUR Take 1 tablet (120 mg total) by mouth daily. What changed:   medication strength  how much to take  additional instructions   Levemir FlexTouch 100 UNIT/ML Pen Generic drug: Insulin Detemir Inject 35 Units into the skin at bedtime.   metFORMIN 1000 MG tablet Commonly known as: GLUCOPHAGE Take 1,000 mg by mouth 2 (two) times daily with a meal.   metoprolol succinate 50 MG 24 hr tablet Commonly known as: TOPROL-XL TAKE 1 AND 1/2 TABLETS BY MOUTH DAILY What changed:   how much to take  how to take this  when to take this  additional instructions   nitroGLYCERIN 0.4 MG SL tablet Commonly known as: NITROSTAT Place 0.4 mg under the tongue every 5 (five) minutes as needed for chest pain.   pantoprazole 40 MG tablet Commonly known as: PROTONIX Take 1 tablet (40 mg  total) by mouth daily.   Ventolin HFA 108 (90 Base) MCG/ACT inhaler Generic drug: albuterol Inhale 1 puff into the lungs every 4 (four) hours as needed for shortness of breath.      Follow-up Information    Georgann Housekeeper, MD Follow up in 1 week(s).   Specialty: Internal Medicine Why: bmp Contact information: [address], [address] [place] [address] [phone]        Lennette Bihari, MD .   Specialty: Cardiology Contact information: [address] [place] [address] [phone]            Time coordinating discharge: 35 minutes  Signed:  Joseph Art DO  [facility] 03/15/2019, 9:32 AM

## 2019-03-15 NOTE — Progress Notes (Addendum)
Progress Note  Patient Name: [name] [name] Date of Encounter: 03/15/2019  Primary Cardiologist: Shelva Majestic, MD   Subjective   Feeling well this morning. Having good urine output on oral lasix. Denies chest pain. Breathing is back at her baseline. Leg swelling significantly improved.   Inpatient Medications    Scheduled Meds: . ALPRAZolam  0.25 mg Oral Once  . aspirin EC  81 mg Oral Daily  . atorvastatin  80 mg Oral QHS  . clopidogrel  75 mg Oral Daily  . furosemide  40 mg Oral Daily  . heparin  5,000 Units Subcutaneous Q8H  . insulin aspart  0-9 Units Subcutaneous Q4H  . insulin glargine  20 Units Subcutaneous Daily  . isosorbide mononitrate  120 mg Oral Daily  . metoprolol succinate  75 mg Oral Daily  . pantoprazole  40 mg Oral Daily  . sodium chloride flush  3 mL Intravenous Once  . sodium chloride flush  3 mL Intravenous Q12H   Continuous Infusions: . sodium chloride     PRN Meds: sodium chloride, acetaminophen **OR** acetaminophen, albuterol, gabapentin, hydrALAZINE, HYDROcodone-acetaminophen, nitroGLYCERIN, sodium chloride flush   Vital Signs    Vitals:   03/14/19 1717 03/14/19 2217 03/15/19 0500 03/15/19 0812  BP: 122/65 138/68  (!) 150/75  Pulse: 71 71  84  Resp: 16 16  19   Temp: 98.2 F (36.8 C) 97.6 F (36.4 C)  (!) 97.5 F (36.4 C)  TempSrc: Oral Oral    SpO2: 98% 95%  98%  Weight:   74.7 kg   Height:        Intake/Output Summary (Last 24 hours) at 03/15/2019 0828 Last data filed at 03/15/2019 0400 Gross per 24 hour  Intake 280 ml  Output 1400 ml  Net -1120 ml   Last 3 Weights 03/15/2019 03/14/2019 03/12/2019  Weight (lbs) 164 lb 10.9 oz 166 lb 7.2 oz 166 lb 3.6 oz  Weight (kg) 74.7 kg 75.5 kg 75.4 kg      Telemetry    NSR - Personally Reviewed  ECG    No new tracing - Personally Reviewed  Physical Exam   GEN: No acute distress.   Neck: No JVD Cardiac: RRR, no murmurs, rubs, or gallops.  Respiratory: Clear to auscultation  bilaterally. GI: Soft, nontender, non-distended  MS: No edema; No deformity. Neuro:  Nonfocal  Psych: Normal affect   Labs    High Sensitivity Troponin:   Recent Labs  Lab 03/11/19 1823 03/11/19 2030 03/12/19 0344  TROPONINIHS 123* 110* 115*      Chemistry Recent Labs  Lab 03/12/19 0344 03/12/19 0344 03/13/19 0718 03/14/19 0736 03/15/19 0402  NA 140   < > 142 139 139  K 4.1   < > 4.4 4.6 4.5  CL 105   < > 108 110 105  CO2 23   < > 21* 22 23  GLUCOSE 129*   < > 136* 130* 133*  BUN 33*   < > 36* 39* 43*  CREATININE 1.16*   < > 1.24* 1.51* 1.45*  CALCIUM 9.0   < > 9.2 9.1 9.2  PROT 6.7  --   --   --   --   ALBUMIN 3.4*  --   --   --   --   AST 24  --   --   --   --   ALT 24  --   --   --   --   ALKPHOS 97  --   --   --   --  BILITOT 0.8  --   --   --   --   GFRNONAA 48*   < > 45* 35* 37*  GFRAA 56*   < > 52* 41* 43*  ANIONGAP 12   < > 13 7 11    < > = values in this interval not displayed.     Hematology Recent Labs  Lab 03/11/19 1823 03/12/19 0344 03/13/19 0718  WBC 9.2 9.7 8.7  RBC 3.60* 3.39* 3.46*  HGB 10.1* 9.3* 9.6*  HCT 31.0* 29.0* 29.5*  MCV 86.1 85.5 85.3  MCH 28.1 27.4 27.7  MCHC 32.6 32.1 32.5  RDW 14.5 14.6 14.8  PLT 310 288 269    BNP Recent Labs  Lab 03/11/19 1823  BNP 2,460.7*     DDimer No results for input(s): DDIMER in the last 168 hours.   Radiology    No results found.  Cardiac Studies   ECHO 03/13/2019 1. Diffuse hypokinesis worse in the septum and inferior base. Left  ventricular ejection fraction, by estimation, is 25 to 30%. The left  ventricle has moderate to severely decreased function. The left ventricle  demonstrates global hypokinesis. The left  ventricular internal cavity size was moderately dilated. Left ventricular  diastolic parameters are consistent with Grade III diastolic dysfunction  (restrictive). Elevated left ventricular end-diastolic pressure.  2. Right ventricular systolic function is normal.  The right ventricular  size is normal. There is moderately elevated pulmonary artery systolic  pressure.  3. Left atrial size was moderately dilated.  4. Right atrial size was mildly dilated.  5. The mitral valve is normal in structure and function. Mild mitral  valve regurgitation. No evidence of mitral stenosis.  6. The aortic valve is normal in structure and function. Aortic valve  regurgitation is not visualized. No aortic stenosis is present.   Patient Profile     69 y.o. female with severe multivessel CAD, not a candidate for surgical or percutaneous revascularization, presents with unstable angina due to acute on chronic HF, gradual decompensation, evidence of further deterioration in LVEF by echo, restrictive filling.  Assessment & Plan   Acute on chronic combined heart failure -had good output transitioning to PO yesterday -renal function and electrolytes stable  -appears euvolemic on exam -continue beta blocker -stable for discharge from cardiac standpoint   Severe multivessel CAD -not a candidate for surgical or percutaneous intervention -continue DAPT, statin, beta blocker and long acting nitrates       For questions or updates, please contact [facility] Please consult [email or web address] for contact info under        Signed, 73, DO  03/15/2019, 8:28 AM    I have examined the patient and reviewed assessment and plan and discussed with patient.  Agree with above as stated.    Diuresed well.  Would walk the patient and check to see if she needs home oxygen.  OK for discharge from a cardiac standpoint. Aggressive medical therapy for CAD.  03/17/2019

## 2019-03-20 ENCOUNTER — Inpatient Hospital Stay (HOSPITAL_COMMUNITY): Payer: Medicare Other

## 2019-03-20 ENCOUNTER — Encounter (HOSPITAL_COMMUNITY): Payer: Self-pay | Admitting: Emergency Medicine

## 2019-03-20 ENCOUNTER — Emergency Department (HOSPITAL_COMMUNITY): Payer: Medicare Other

## 2019-03-20 ENCOUNTER — Other Ambulatory Visit: Payer: Self-pay

## 2019-03-20 ENCOUNTER — Inpatient Hospital Stay (HOSPITAL_COMMUNITY)
Admission: EM | Admit: 2019-03-20 | Discharge: 2019-03-25 | DRG: 309 | Disposition: A | Discharge status: Home / Self Care | Payer: Medicare Other | Attending: Internal Medicine | Admitting: Internal Medicine

## 2019-03-20 DIAGNOSIS — Z823 Family history of stroke: Secondary | ICD-10-CM | POA: Diagnosis not present

## 2019-03-20 DIAGNOSIS — N2889 Other specified disorders of kidney and ureter: Secondary | ICD-10-CM | POA: Diagnosis not present

## 2019-03-20 DIAGNOSIS — Z91040 Latex allergy status: Secondary | ICD-10-CM

## 2019-03-20 DIAGNOSIS — E119 Type 2 diabetes mellitus without complications: Secondary | ICD-10-CM

## 2019-03-20 DIAGNOSIS — E1122 Type 2 diabetes mellitus with diabetic chronic kidney disease: Secondary | ICD-10-CM | POA: Diagnosis present

## 2019-03-20 DIAGNOSIS — R778 Other specified abnormalities of plasma proteins: Secondary | ICD-10-CM | POA: Diagnosis not present

## 2019-03-20 DIAGNOSIS — I509 Heart failure, unspecified: Secondary | ICD-10-CM

## 2019-03-20 DIAGNOSIS — R531 Weakness: Secondary | ICD-10-CM | POA: Diagnosis not present

## 2019-03-20 DIAGNOSIS — D631 Anemia in chronic kidney disease: Secondary | ICD-10-CM | POA: Diagnosis present

## 2019-03-20 DIAGNOSIS — I255 Ischemic cardiomyopathy: Secondary | ICD-10-CM | POA: Diagnosis present

## 2019-03-20 DIAGNOSIS — Z89421 Acquired absence of other right toe(s): Secondary | ICD-10-CM

## 2019-03-20 DIAGNOSIS — I480 Paroxysmal atrial fibrillation: Secondary | ICD-10-CM | POA: Diagnosis not present

## 2019-03-20 DIAGNOSIS — I739 Peripheral vascular disease, unspecified: Secondary | ICD-10-CM | POA: Diagnosis present

## 2019-03-20 DIAGNOSIS — Z951 Presence of aortocoronary bypass graft: Secondary | ICD-10-CM | POA: Diagnosis not present

## 2019-03-20 DIAGNOSIS — R110 Nausea: Secondary | ICD-10-CM | POA: Diagnosis not present

## 2019-03-20 DIAGNOSIS — E1151 Type 2 diabetes mellitus with diabetic peripheral angiopathy without gangrene: Secondary | ICD-10-CM | POA: Diagnosis present

## 2019-03-20 DIAGNOSIS — E1140 Type 2 diabetes mellitus with diabetic neuropathy, unspecified: Secondary | ICD-10-CM | POA: Diagnosis present

## 2019-03-20 DIAGNOSIS — Z880 Allergy status to penicillin: Secondary | ICD-10-CM

## 2019-03-20 DIAGNOSIS — I248 Other forms of acute ischemic heart disease: Secondary | ICD-10-CM | POA: Diagnosis present

## 2019-03-20 DIAGNOSIS — Z7902 Long term (current) use of antithrombotics/antiplatelets: Secondary | ICD-10-CM

## 2019-03-20 DIAGNOSIS — N183 Chronic kidney disease, stage 3 unspecified: Secondary | ICD-10-CM | POA: Diagnosis not present

## 2019-03-20 DIAGNOSIS — R000 Tachycardia, unspecified: Secondary | ICD-10-CM | POA: Diagnosis not present

## 2019-03-20 DIAGNOSIS — K219 Gastro-esophageal reflux disease without esophagitis: Secondary | ICD-10-CM | POA: Diagnosis present

## 2019-03-20 DIAGNOSIS — I4891 Unspecified atrial fibrillation: Secondary | ICD-10-CM | POA: Diagnosis not present

## 2019-03-20 DIAGNOSIS — F410 Panic disorder [episodic paroxysmal anxiety] without agoraphobia: Secondary | ICD-10-CM | POA: Diagnosis not present

## 2019-03-20 DIAGNOSIS — I4819 Other persistent atrial fibrillation: Principal | ICD-10-CM | POA: Diagnosis present

## 2019-03-20 DIAGNOSIS — I5022 Chronic systolic (congestive) heart failure: Secondary | ICD-10-CM | POA: Diagnosis present

## 2019-03-20 DIAGNOSIS — I2510 Atherosclerotic heart disease of native coronary artery without angina pectoris: Secondary | ICD-10-CM | POA: Diagnosis present

## 2019-03-20 DIAGNOSIS — Z8673 Personal history of transient ischemic attack (TIA), and cerebral infarction without residual deficits: Secondary | ICD-10-CM | POA: Diagnosis not present

## 2019-03-20 DIAGNOSIS — Z8249 Family history of ischemic heart disease and other diseases of the circulatory system: Secondary | ICD-10-CM

## 2019-03-20 DIAGNOSIS — R0789 Other chest pain: Secondary | ICD-10-CM | POA: Diagnosis not present

## 2019-03-20 DIAGNOSIS — Z794 Long term (current) use of insulin: Secondary | ICD-10-CM | POA: Diagnosis not present

## 2019-03-20 DIAGNOSIS — N179 Acute kidney failure, unspecified: Secondary | ICD-10-CM | POA: Diagnosis present

## 2019-03-20 DIAGNOSIS — E875 Hyperkalemia: Secondary | ICD-10-CM | POA: Diagnosis present

## 2019-03-20 DIAGNOSIS — N1832 Chronic kidney disease, stage 3b: Secondary | ICD-10-CM | POA: Diagnosis present

## 2019-03-20 DIAGNOSIS — R079 Chest pain, unspecified: Secondary | ICD-10-CM | POA: Diagnosis not present

## 2019-03-20 DIAGNOSIS — N1831 Chronic kidney disease, stage 3a: Secondary | ICD-10-CM | POA: Diagnosis not present

## 2019-03-20 DIAGNOSIS — E785 Hyperlipidemia, unspecified: Secondary | ICD-10-CM | POA: Diagnosis present

## 2019-03-20 DIAGNOSIS — R42 Dizziness and giddiness: Secondary | ICD-10-CM | POA: Diagnosis not present

## 2019-03-20 DIAGNOSIS — Z955 Presence of coronary angioplasty implant and graft: Secondary | ICD-10-CM

## 2019-03-20 DIAGNOSIS — I5042 Chronic combined systolic (congestive) and diastolic (congestive) heart failure: Secondary | ICD-10-CM | POA: Diagnosis present

## 2019-03-20 DIAGNOSIS — I252 Old myocardial infarction: Secondary | ICD-10-CM

## 2019-03-20 DIAGNOSIS — Z7982 Long term (current) use of aspirin: Secondary | ICD-10-CM

## 2019-03-20 DIAGNOSIS — E1142 Type 2 diabetes mellitus with diabetic polyneuropathy: Secondary | ICD-10-CM

## 2019-03-20 DIAGNOSIS — K5900 Constipation, unspecified: Secondary | ICD-10-CM | POA: Diagnosis not present

## 2019-03-20 DIAGNOSIS — Z20822 Contact with and (suspected) exposure to covid-19: Secondary | ICD-10-CM | POA: Diagnosis present

## 2019-03-20 DIAGNOSIS — Z90710 Acquired absence of both cervix and uterus: Secondary | ICD-10-CM

## 2019-03-20 DIAGNOSIS — I130 Hypertensive heart and chronic kidney disease with heart failure and stage 1 through stage 4 chronic kidney disease, or unspecified chronic kidney disease: Secondary | ICD-10-CM | POA: Diagnosis present

## 2019-03-20 DIAGNOSIS — R7989 Other specified abnormal findings of blood chemistry: Secondary | ICD-10-CM | POA: Diagnosis not present

## 2019-03-20 DIAGNOSIS — Z79899 Other long term (current) drug therapy: Secondary | ICD-10-CM

## 2019-03-20 DIAGNOSIS — Z87891 Personal history of nicotine dependence: Secondary | ICD-10-CM

## 2019-03-20 DIAGNOSIS — I959 Hypotension, unspecified: Secondary | ICD-10-CM | POA: Diagnosis not present

## 2019-03-20 DIAGNOSIS — R0602 Shortness of breath: Secondary | ICD-10-CM

## 2019-03-20 DIAGNOSIS — R0600 Dyspnea, unspecified: Secondary | ICD-10-CM | POA: Diagnosis not present

## 2019-03-20 DIAGNOSIS — Z833 Family history of diabetes mellitus: Secondary | ICD-10-CM

## 2019-03-20 DIAGNOSIS — I455 Other specified heart block: Secondary | ICD-10-CM | POA: Diagnosis not present

## 2019-03-20 DIAGNOSIS — Z888 Allergy status to other drugs, medicaments and biological substances status: Secondary | ICD-10-CM

## 2019-03-20 DIAGNOSIS — I499 Cardiac arrhythmia, unspecified: Secondary | ICD-10-CM | POA: Diagnosis not present

## 2019-03-20 DIAGNOSIS — Z881 Allergy status to other antibiotic agents status: Secondary | ICD-10-CM

## 2019-03-20 LAB — COMPREHENSIVE METABOLIC PANEL
ALT: 16 U/L (ref 0–44)
AST: 20 U/L (ref 15–41)
Albumin: 3.5 g/dL (ref 3.5–5.0)
Alkaline Phosphatase: 101 U/L (ref 38–126)
Anion gap: 16 — ABNORMAL HIGH (ref 5–15)
BUN: 50 mg/dL — ABNORMAL HIGH (ref 8–23)
CO2: 17 mmol/L — ABNORMAL LOW (ref 22–32)
Calcium: 9.3 mg/dL (ref 8.9–10.3)
Chloride: 104 mmol/L (ref 98–111)
Creatinine, Ser: 1.77 mg/dL — ABNORMAL HIGH (ref 0.44–1.00)
GFR calc Af Amer: 34 mL/min — ABNORMAL LOW (ref >60)
GFR calc non Af Amer: 29 mL/min — ABNORMAL LOW (ref >60)
Glucose, Bld: 229 mg/dL — ABNORMAL HIGH (ref 70–99)
Potassium: 5.8 mmol/L — ABNORMAL HIGH (ref 3.5–5.1)
Sodium: 137 mmol/L (ref 135–145)
Total Bilirubin: 0.7 mg/dL (ref 0.3–1.2)
Total Protein: 7.3 g/dL (ref 6.5–8.1)

## 2019-03-20 LAB — CBC WITH DIFFERENTIAL/PLATELET
Abs Immature Granulocytes: 0.03 10*3/uL (ref 0.00–0.07)
Basophils Absolute: 0 10*3/uL (ref 0.0–0.1)
Basophils Relative: 1 %
Eosinophils Absolute: 0.4 10*3/uL (ref 0.0–0.5)
Eosinophils Relative: 5 %
HCT: 35.9 % — ABNORMAL LOW (ref 36.0–46.0)
Hemoglobin: 11.3 g/dL — ABNORMAL LOW (ref 12.0–15.0)
Immature Granulocytes: 0 %
Lymphocytes Relative: 9 %
Lymphs Abs: 0.8 10*3/uL (ref 0.7–4.0)
MCH: 27.2 pg (ref 26.0–34.0)
MCHC: 31.5 g/dL (ref 30.0–36.0)
MCV: 86.5 fL (ref 80.0–100.0)
Monocytes Absolute: 0.3 10*3/uL (ref 0.1–1.0)
Monocytes Relative: 4 %
Neutro Abs: 7.2 10*3/uL (ref 1.7–7.7)
Neutrophils Relative %: 81 %
Platelets: 313 10*3/uL (ref 150–400)
RBC: 4.15 MIL/uL (ref 3.87–5.11)
RDW: 15 % (ref 11.5–15.5)
WBC: 8.8 10*3/uL (ref 4.0–10.5)
nRBC: 0 % (ref 0.0–0.2)

## 2019-03-20 LAB — HEMOGLOBIN A1C
Hgb A1c MFr Bld: 6.6 % — ABNORMAL HIGH (ref 4.8–5.6)
Mean Plasma Glucose: 142.72 mg/dL

## 2019-03-20 LAB — BRAIN NATRIURETIC PEPTIDE: B Natriuretic Peptide: 2161.2 pg/mL — ABNORMAL HIGH (ref 0.0–100.0)

## 2019-03-20 LAB — GLUCOSE, CAPILLARY: Glucose-Capillary: 135 mg/dL — ABNORMAL HIGH (ref 70–99)

## 2019-03-20 LAB — TROPONIN I (HIGH SENSITIVITY)
Troponin I (High Sensitivity): 63 ng/L — ABNORMAL HIGH (ref <18)
Troponin I (High Sensitivity): 1069 ng/L (ref <18)

## 2019-03-20 LAB — SARS CORONAVIRUS 2 (TAT 6-24 HRS): SARS Coronavirus 2: NEGATIVE

## 2019-03-20 LAB — TSH: TSH: 0.771 u[IU]/mL (ref 0.350–4.500)

## 2019-03-20 MED ORDER — ALBUTEROL SULFATE (2.5 MG/3ML) 0.083% IN NEBU
3.0000 mL | INHALATION_SOLUTION | RESPIRATORY_TRACT | Status: DC | PRN
  Administered 2019-03-21: 3 mL via RESPIRATORY_TRACT
  Filled 2019-03-20 (×2): qty 3

## 2019-03-20 MED ORDER — METOPROLOL TARTRATE 5 MG/5ML IV SOLN
2.5000 mg | Freq: Once | INTRAVENOUS | Status: AC
  Administered 2019-03-20: 2.5 mg via INTRAVENOUS
  Filled 2019-03-20: qty 5

## 2019-03-20 MED ORDER — INSULIN ASPART 100 UNIT/ML ~~LOC~~ SOLN
0.0000 [IU] | Freq: Three times a day (TID) | SUBCUTANEOUS | Status: DC
  Administered 2019-03-21: 1 [IU] via SUBCUTANEOUS
  Administered 2019-03-21: 2 [IU] via SUBCUTANEOUS
  Administered 2019-03-21: 3 [IU] via SUBCUTANEOUS
  Administered 2019-03-22 (×2): 2 [IU] via SUBCUTANEOUS
  Administered 2019-03-23 – 2019-03-24 (×3): 1 [IU] via SUBCUTANEOUS
  Administered 2019-03-24: 3 [IU] via SUBCUTANEOUS
  Administered 2019-03-25: 5 [IU] via SUBCUTANEOUS

## 2019-03-20 MED ORDER — ATORVASTATIN CALCIUM 80 MG PO TABS
80.0000 mg | ORAL_TABLET | Freq: Every day | ORAL | Status: DC
  Administered 2019-03-20 – 2019-03-24 (×5): 80 mg via ORAL
  Filled 2019-03-20 (×5): qty 1

## 2019-03-20 MED ORDER — CLOPIDOGREL BISULFATE 75 MG PO TABS
75.0000 mg | ORAL_TABLET | Freq: Every day | ORAL | Status: DC
  Administered 2019-03-20 – 2019-03-25 (×6): 75 mg via ORAL
  Filled 2019-03-20 (×6): qty 1

## 2019-03-20 MED ORDER — LEVALBUTEROL HCL 1.25 MG/0.5ML IN NEBU
1.2500 mg | INHALATION_SOLUTION | Freq: Three times a day (TID) | RESPIRATORY_TRACT | Status: AC | PRN
  Administered 2019-03-20 – 2019-03-21 (×2): 1.25 mg via RESPIRATORY_TRACT
  Filled 2019-03-20 (×2): qty 0.5

## 2019-03-20 MED ORDER — DILTIAZEM HCL-DEXTROSE 125-5 MG/125ML-% IV SOLN (PREMIX)
5.0000 mg/h | INTRAVENOUS | Status: DC
  Administered 2019-03-20: 12:00:00 5 mg/h via INTRAVENOUS
  Filled 2019-03-20: qty 125

## 2019-03-20 MED ORDER — INSULIN DETEMIR 100 UNIT/ML ~~LOC~~ SOLN
35.0000 [IU] | Freq: Every day | SUBCUTANEOUS | Status: DC
  Administered 2019-03-20 – 2019-03-24 (×5): 35 [IU] via SUBCUTANEOUS
  Filled 2019-03-20 (×6): qty 0.35

## 2019-03-20 MED ORDER — DILTIAZEM LOAD VIA INFUSION
10.0000 mg | Freq: Once | INTRAVENOUS | Status: AC
  Administered 2019-03-20: 10 mg via INTRAVENOUS
  Filled 2019-03-20: qty 10

## 2019-03-20 MED ORDER — APIXABAN 5 MG PO TABS
5.0000 mg | ORAL_TABLET | Freq: Two times a day (BID) | ORAL | Status: DC
  Administered 2019-03-21 – 2019-03-25 (×10): 5 mg via ORAL
  Filled 2019-03-20 (×11): qty 1

## 2019-03-20 MED ORDER — ONDANSETRON HCL 4 MG/2ML IJ SOLN
4.0000 mg | Freq: Four times a day (QID) | INTRAMUSCULAR | Status: DC | PRN
  Administered 2019-03-22 (×2): 4 mg via INTRAVENOUS
  Filled 2019-03-20 (×3): qty 2

## 2019-03-20 MED ORDER — APIXABAN 5 MG PO TABS
10.0000 mg | ORAL_TABLET | Freq: Once | ORAL | Status: AC
  Administered 2019-03-20: 10 mg via ORAL
  Filled 2019-03-20: qty 2

## 2019-03-20 MED ORDER — ACETAMINOPHEN 325 MG PO TABS: 650.0000 mg | ORAL_TABLET | ORAL | Status: DC | PRN

## 2019-03-20 MED ORDER — SODIUM ZIRCONIUM CYCLOSILICATE 10 G PO PACK
10.0000 g | PACK | Freq: Once | ORAL | Status: AC
  Administered 2019-03-20: 10 g via ORAL
  Filled 2019-03-20: qty 1

## 2019-03-20 MED ORDER — INSULIN ASPART 100 UNIT/ML ~~LOC~~ SOLN
0.0000 [IU] | Freq: Every day | SUBCUTANEOUS | Status: DC
  Administered 2019-03-21: 2 [IU] via SUBCUTANEOUS
  Administered 2019-03-23: 3 [IU] via SUBCUTANEOUS
  Administered 2019-03-24: 2 [IU] via SUBCUTANEOUS

## 2019-03-20 MED ORDER — PANTOPRAZOLE SODIUM 40 MG PO TBEC
40.0000 mg | DELAYED_RELEASE_TABLET | Freq: Every day | ORAL | Status: DC
  Administered 2019-03-20 – 2019-03-25 (×6): 40 mg via ORAL
  Filled 2019-03-20 (×6): qty 1

## 2019-03-20 MED ORDER — ISOSORBIDE MONONITRATE ER 60 MG PO TB24
120.0000 mg | ORAL_TABLET | Freq: Every day | ORAL | Status: DC
  Administered 2019-03-20 – 2019-03-24 (×5): 120 mg via ORAL
  Filled 2019-03-20 (×5): qty 2

## 2019-03-20 MED ORDER — METOPROLOL TARTRATE 50 MG PO TABS
50.0000 mg | ORAL_TABLET | Freq: Four times a day (QID) | ORAL | Status: DC
  Administered 2019-03-20 – 2019-03-21 (×6): 50 mg via ORAL
  Filled 2019-03-20 (×5): qty 1
  Filled 2019-03-20: qty 2

## 2019-03-20 MED ORDER — HEPARIN (PORCINE) 25000 UT/250ML-% IV SOLN
850.0000 [IU]/h | INTRAVENOUS | Status: DC
  Administered 2019-03-20: 850 [IU]/h via INTRAVENOUS
  Filled 2019-03-20: qty 250

## 2019-03-20 MED ORDER — HEPARIN BOLUS VIA INFUSION
4000.0000 [IU] | Freq: Once | INTRAVENOUS | Status: AC
  Administered 2019-03-20: 4000 [IU] via INTRAVENOUS
  Filled 2019-03-20: qty 4000

## 2019-03-20 MED ORDER — NITROGLYCERIN 0.4 MG SL SUBL: 0.4000 mg | SUBLINGUAL_TABLET | SUBLINGUAL | Status: DC | PRN

## 2019-03-20 NOTE — Progress Notes (Signed)
ANTICOAGULATION CONSULT NOTE - Initial Consult  Pharmacy Consult for heparin Indication: chest pain/ACS and atrial fibrillation  Allergies  Allergen Reactions  . Doxycycline Other (See Comments)    Lethargy   . Hydrochlorothiazide Other (See Comments)    Lethargy   . Latex Rash  . Penicillins Swelling and Rash    Pt states she has tolerated Keflex in the past without problems. States she may have tolerated Augmentin in the past but it caused GI upset. Has patient had a PCN reaction causing immediate rash, facial/tongue/throat swelling, SOB or lightheadedness with hypotension: Yes Has patient had a PCN reaction causing severe rash involving mucus membranes or skin necrosis: No Has patient had a PCN reaction that required hospitalization No Has patient had a PCN reaction occurring within the last 10 years: No    Patient Measurements:   Heparin Dosing Weight: 70kg  Vital Signs: Temp: 97.7 F (36.5 C) (02/20 1143) BP: 122/79 (02/20 1215) Pulse Rate: 102 (02/20 1226)  Labs: Recent Labs    03/20/19 1157  HGB 11.3*  HCT 35.9*  PLT 313  CREATININE 1.77*  TROPONINIHS 63*    Estimated Creatinine Clearance: 30.1 mL/min (A) (by C-G formula based on SCr of 1.77 mg/dL (H)).   Medical History: Past Medical History:  Diagnosis Date  . Anemia 10/2015   Acute Blood Loss  . Arthritis    "feel like I have it all over" (08/28/2015)  . CHF (congestive heart failure) (HCC)   . Complication of anesthesia    DIFFICULT WAKING "only when I was smoking; no problems since I quit"  . Coronary artery disease   . Family history of adverse reaction to anesthesia    sister slow to wake up  . GERD (gastroesophageal reflux disease)    takes Protonix daily   . Hip bursitis   . History of blood transfusion    10/2015  . Hyperlipidemia LDL goal < 70 06/28/2013   takes Atorvastatin daily  . Hypertension    takes Metoprolol and Imdur daily  . Hypoxia 01/2018  . Malnutrition (HCC)   .  Migraine    "none in a long time" (08/28/2015)  . Myocardial infarction (HCC) 2011  . Neuromuscular disorder (HCC)    DIABETIC NEUROPATHY  . Osteomyelitis (HCC) 2017   Left foot  . PAD (peripheral artery disease) (HCC)   . Peripheral vascular disease (HCC)   . Respiratory failure (HCC) 10/2015   Acute Hypoxia- acute pulmonary edema 11/13/2015  . Septic shock (HCC) 10/2015  . Stroke (HCC)   . Type II diabetes mellitus (HCC)    takes Lantus nightly.Average fasting blood sugar runs 80-90  Type II    Assessment: 68 YOF presenting with CP, also new onset afib with intermittent RVR, hx CAD.  Not on anticoagulation PTA, chronic anemia stable.    Goal of Therapy:  Heparin level 0.3-0.7 units/ml Monitor platelets by anticoagulation protocol: Yes   Plan:  Heparin 4000 units IV x 1, and gtt at 850 units/hr F/u 8 hour heparin level  Daylene Posey, PharmD Clinical Pharmacist Please check AMION for all [facility] numbers 03/20/2019 12:44 PM

## 2019-03-20 NOTE — Progress Notes (Signed)
Received to 0R03 via stretcher from ED. Ambulated from stretcher to bed with mod assist x 2. States she has rollator walker she uses at home. Oriented to room/unit. Call bell telephone in reach. Instructed to call for needs and oob assist. Verbalized understanding.

## 2019-03-20 NOTE — Consult Note (Addendum)
Cardiology Consultation:   Patient ID: [name] [name] MRN: [id number]; DOB: [birth year]  Admit date: 03/20/2019 Date of Consult: 03/20/2019  Primary Care Provider: Wenda Low, MD Primary Cardiologist: Shelva Majestic, MD  Primary Electrophysiologist:  None    Patient Profile:   [name] [name] is a 69 y.o. female with a hx of coronary artery disease not amenable to further PCI or CABG, PVD who is being seen today for the evaluation of atrial fibrillation with rapid ventricular response, chest discomfort at the request of Dr. Ralene Bathe.  History of Present Illness:   [name] [name] is a 69 year old female who was recently discharged on 03/14/2018 secondary to volume overload, EF 25% who presents today with rapid atrial fibrillation, associated ST depression globally on ECG with aVR elevation, occasional chest discomfort.  I discussed with Dr. Ralene Bathe.  During last hospitalization, her troponins were in the 120 range, flat.  Her creatinine started at 1.16 and went as high as 1.5, and on discharge was 1.45.  She states that she woke up at around 3:00 this morning feeling poor, some chest discomfort.  Mild shortness of breath.  She thought that she was having a heart attack.  She decided to come into the hospital for further evaluation later this morning.  When she arrived, she was noted to be in atrial fibrillation 125.  She did not have a prior history of A. Fib.  She used to bring her mother, [name] [name], to see me for her cardiology appointments.   Heart Pathway Score:     Past Medical History:  Diagnosis Date  . Anemia 10/2015   Acute Blood Loss  . Arthritis    "feel like I have it all over" (08/28/2015)  . CHF (congestive heart failure) ([place])   . Complication of anesthesia    DIFFICULT WAKING "only when I was smoking; no problems since I quit"  . Coronary artery disease   . Family history of adverse reaction to anesthesia    sister slow to wake up  . GERD (gastroesophageal reflux disease)      takes Protonix daily   . Hip bursitis   . History of blood transfusion    10/2015  . Hyperlipidemia LDL goal < 70 06/28/2013   takes Atorvastatin daily  . Hypertension    takes Metoprolol and Imdur daily  . Hypoxia 01/2018  . Malnutrition ([place])   . Migraine    "none in a long time" (08/28/2015)  . Myocardial infarction ([place]) 2011  . Neuromuscular disorder ([place])    DIABETIC NEUROPATHY  . Osteomyelitis ([place]) 2017   Left foot  . PAD (peripheral artery disease) ([place])   . Peripheral vascular disease ([place])   . Respiratory failure ([place]) 10/2015   Acute Hypoxia- acute pulmonary edema 11/13/2015  . Septic shock ([place]) 10/2015  . Stroke ([place])   . Type II diabetes mellitus ([facility])    takes Lantus nightly.Average fasting blood sugar runs 80-90  Type II    Past Surgical History:  Procedure Laterality Date  . ABDOMINAL HYSTERECTOMY    . AMPUTATION Right 10/22/2016   Procedure: AMPUTATION DIGIT RIGHT TOES 1-3 POSSIBLE TRANSMETATARSAL;  Surgeon: Angelia Mould, MD;  Location: [place];  Service: Vascular;  Laterality: Right;  . APPENDECTOMY    . ATHERECTOMY N/A 06/04/2011   Procedure: ATHERECTOMY;  Surgeon: Lorretta Harp, MD;  Location: [facility] CATH LAB;  Service: Cardiovascular;  Laterality: N/A;  . CARDIAC CATHETERIZATION  10/13/2009   95% stenosis in the AV groove circumflex and  95% ostial stenosis in small OM3. A 3x32mm drug-eluting Promus stent inserted ito the circumflex. Dilatated with a 3.25x95mm noncompliant Quantum balloon within entire segment. The entire region was reduced to 0% and brisk TIMI3 flow.  . CAROTID DUPLEX  03/19/2011   Right ICA-demonstrates complete occlusion. Left ICA-demonstrates a small amount of fibrous plaque.  Marland Kitchen CATARACT EXTRACTION W/ INTRAOCULAR LENS IMPLANT Right   . CESAREAN SECTION  1990  . CORONARY ANGIOPLASTY    . ENDARTERECTOMY FEMORAL Left 09/05/2015   Procedure: ENDARTERECTOMY FEMORAL WITH PROFUNDOPLASTY;  Surgeon: Chuck Hint, MD;  Location:  [facility] OR;  Service: Vascular;  Laterality: Left;  Left common femoral artery vein patch using left saphenous vien  . ENDARTERECTOMY FEMORAL Right 10/22/2016   Procedure: ENDARTERECTOMY RIGHT COMMON FEMORAL;  Surgeon: Chuck Hint, MD;  Location: [facility] OR;  Service: Vascular;  Laterality: Right;  . FEMORAL-POPLITEAL BYPASS GRAFT Left 11/02/2015   Procedure: BYPASS GRAFT FEMORAL-POPLITEAL ARTERY VS FEMORAL-TIBIAL ARTERY BYPASS;  Surgeon: Chuck Hint, MD;  Location: [facility] OR;  Service: Vascular;  Laterality: Left;  . FEMORAL-POPLITEAL BYPASS GRAFT Right 10/22/2016   Procedure: BYPASS GRAFT RIGHT FEMORAL- BELOW KNEE POPLITEAL ARTERY WITH VEIN;  Surgeon: Chuck Hint, MD;  Location: [facility] OR;  Service: Vascular;  Laterality: Right;  . I & D EXTREMITY Left 11/10/2015   Procedure: Debridement Left Foot Ulcer, Application  Wound VAC;  Surgeon: [name], MD;  Location: [facility] OR;  Service: Orthopedics;  Laterality: Left;  . ILIAC ARTERY STENT Left 08/28/2015   common  . INTRAOPERATIVE ARTERIOGRAM Left 09/05/2015   Procedure: INTRA OPERATIVE ARTERIOGRAM;  Surgeon: Chuck Hint, MD;  Location: [facility] OR;  Service: Vascular;  Laterality: Left;  . INTRAOPERATIVE ARTERIOGRAM Left 11/02/2015   Procedure: INTRA OPERATIVE ARTERIOGRAM;  Surgeon: Chuck Hint, MD;  Location: [facility] OR;  Service: Vascular;  Laterality: Left;  . INTRAOPERATIVE ARTERIOGRAM Right 10/22/2016   Procedure: INTRA OPERATIVE ARTERIOGRAM;  Surgeon: Chuck Hint, MD;  Location: [facility] OR;  Service: Vascular;  Laterality: Right;  . LEFT HEART CATH AND CORONARY ANGIOGRAPHY N/A 05/11/2018   Procedure: LEFT HEART CATH AND CORONARY ANGIOGRAPHY;  Surgeon: Lennette Bihari, MD;  Location: [facility] INVASIVE CV LAB;  Service: Cardiovascular;  Laterality: N/A;  Eugenie Birks MYOVIEW  10/25/2010   Moderate perfusion defect due to infarct/scar with mild perinfarct ischemia seen in the Basal Inferolateral, Basal Anterolateral, Mid  Inferolateral, and Mid Anterolateral regions. Post-stress EF is 50%.  . LOWER EXTREMITY ANGIOGRAPHY N/A 10/17/2016   Procedure: Lower Extremity Angiography;  Surgeon: Runell Gess, MD;  Location: [place] INVASIVE CV LAB;  Service: Cardiovascular;  Laterality: N/A;  . OVARY SURGERY  1983?   "ruptured"  . PERIPHERAL VASCULAR ANGIOGRAM  01/26/2010   High-grade SFA disease: left greater than right. Left SFA would require fem-pop bypass grafting. Right SFA could be stented but might require Diamondback Orbital atherectomy.  Marland Kitchen PERIPHERAL VASCULAR ANGIOGRAM  02/23/2010   Stealth Predator orbital rotational atherectomy performed on SFA & Popliteal up to 90,000 RPM. Stenting using overlapping 5x166mm and 5x2mm Absolute Pro Nitinol self-expanding stents beginning just at the knee up to the mid SFA resulting in reduction of 90-95% calcified SFA & Popliteal stenosis to 0. Stenting performed on the distal common & proximal iliac artery with a 10x4 Absolute Pro- 70-0%.  Marland Kitchen PERIPHERAL VASCULAR ANGIOGRAM  06/17/2010   PTA performed to the right external iliac artery stent using a 5x100 balloon at 10 atmospheres. Stenting performed using a 6x18 Genesis on Opta balloon.  Postdilatation with a 7x2 balloon resulting in a 95% "in-stent" stenosis to 0% residual.  . PERIPHERAL VASCULAR ANGIOGRAM  06/04/2011   Bilateral total SFAs not percutaneously addressable. Good canidate for femoropopliteal bypass grafting  . PERIPHERAL VASCULAR ANGIOGRAM  08/28/2015  . PERIPHERAL VASCULAR CATHETERIZATION N/A 08/28/2015   Procedure: Lower Extremity Angiography;  Surgeon: Runell Gess, MD;  Location: [facility] INVASIVE CV LAB;  Service: Cardiovascular;  Laterality: N/A;  . PERIPHERAL VASCULAR CATHETERIZATION N/A 08/28/2015   Procedure: Abdominal Aortogram;  Surgeon: Runell Gess, MD;  Location: [facility] INVASIVE CV LAB;  Service: Cardiovascular;  Laterality: N/A;  . PERIPHERAL VASCULAR CATHETERIZATION Left 08/28/2015   Procedure: Peripheral  Vascular Intervention;  Surgeon: Runell Gess, MD;  Location: [facility] INVASIVE CV LAB;  Service: Cardiovascular;  Laterality: Left;  common iliac  . PERIPHERAL VASCULAR CATHETERIZATION Left 08/28/2015   Procedure: Peripheral Vascular Atherectomy;  Surgeon: Runell Gess, MD;  Location: [facility] INVASIVE CV LAB;  Service: Cardiovascular;  Laterality: Left;  common iliac  . PERIPHERAL VASCULAR CATHETERIZATION N/A 09/28/2015   Procedure: Lower Extremity Angiography;  Surgeon: Runell Gess, MD;  Location: [facility] INVASIVE CV LAB;  Service: Cardiovascular;  Laterality: N/A;  . PERIPHERAL VASCULAR CATHETERIZATION Left 09/28/2015   Procedure: Peripheral Vascular Intervention;  Surgeon: Runell Gess, MD;  Location: [facility] INVASIVE CV LAB;  Service: Cardiovascular;  Laterality: Left CFA  PCI with 9 mm x 4 cm Abbott nitinol absolute Pro self-expanding stent     . SKIN SPLIT GRAFT Left 12/01/2015   Procedure: LEFT FOOT SKIN GRAFT AND VAC;  Surgeon: [name], MD;  Location: [facility] OR;  Service: Orthopedics;  Laterality: Left;  . THROMBECTOMY FEMORAL ARTERY Right 10/22/2016   Procedure: THROMBECTOMY FEMORAL ARTERY;  Surgeon: Chuck Hint, MD;  Location: [facility] OR;  Service: Vascular;  Laterality: Right;  . TRANSTHORACIC ECHOCARDIOGRAM  10/17/2009   EF 45-50%, moderate hypokinesis of the entire inferolateral myocardium, mild concentric hypertrophy and mild regurg of the mitral valva.  Marland Kitchen VEIN HARVEST Left 11/02/2015   Procedure: LEFT GREATER SAPHENOUS VEIN HARVEST;  Surgeon: Chuck Hint, MD;  Location: [facility] OR;  Service: Vascular;  Laterality: Left;     Home Medications:  Prior to Admission medications   Medication Sig Start Date End Date Taking? Authorizing Provider  acetaminophen (TYLENOL) 325 MG tablet Take 650 mg by mouth every 6 (six) hours as needed for mild pain, fever or headache.    [provider]  aspirin EC 81 MG tablet Take 81 mg by mouth daily.    [provider]    atorvastatin (LIPITOR) 80 MG tablet Take 80 mg by mouth at bedtime.     [provider]  clopidogrel (PLAVIX) 75 MG tablet Take 75 mg by mouth daily.     [provider]  furosemide (LASIX) 40 MG tablet Take 1 tablet (40 mg total) by mouth daily. 03/15/19   Joseph Art, DO  gabapentin (NEURONTIN) 300 MG capsule Take 1 capsule (300 mg total) by mouth at bedtime. Patient taking differently: Take 300 mg by mouth daily as needed (for nerve pain).  05/13/18   Zannie Cove, MD  isosorbide mononitrate (IMDUR) 120 MG 24 hr tablet Take 1 tablet (120 mg total) by mouth daily. 03/15/19   Joseph Art, DO  LEVEMIR FLEXTOUCH 100 UNIT/ML Pen Inject 35 Units into the skin at bedtime.  03/15/16   [provider]  metFORMIN (GLUCOPHAGE) 1000 MG tablet Take 1,000 mg by mouth 2 (two) times daily  with a meal.    [provider]  metoprolol succinate (TOPROL-XL) 50 MG 24 hr tablet TAKE 1 AND 1/2 TABLETS BY MOUTH DAILY Patient taking differently: Take 75 mg by mouth daily.  04/15/18   Lennette Bihari, MD  nitroGLYCERIN (NITROSTAT) 0.4 MG SL tablet Place 0.4 mg under the tongue every 5 (five) minutes as needed for chest pain.  05/15/18   [provider]  pantoprazole (PROTONIX) 40 MG tablet Take 1 tablet (40 mg total) by mouth daily. 12/21/18   Lennette Bihari, MD  VENTOLIN HFA 108 (90 Base) MCG/ACT inhaler Inhale 1 puff into the lungs every 4 (four) hours as needed for shortness of breath. 03/02/18   [provider]    Inpatient Medications: Scheduled Meds:  Continuous Infusions: . heparin 850 Units/hr (03/20/19 1312)   PRN Meds:   Allergies:    Allergies  Allergen Reactions  . Doxycycline Other (See Comments)    Lethargy   . Hydrochlorothiazide Other (See Comments)    Lethargy   . Latex Rash  . Penicillins Swelling and Rash    Pt states she has tolerated Keflex in the past without problems. States she may have tolerated Augmentin in the past but  it caused GI upset. Has patient had a PCN reaction causing immediate rash, facial/tongue/throat swelling, SOB or lightheadedness with hypotension: Yes Has patient had a PCN reaction causing severe rash involving mucus membranes or skin necrosis: No Has patient had a PCN reaction that required hospitalization No Has patient had a PCN reaction occurring within the last 10 years: No    Social History:   Social History   Socioeconomic History  . Marital status: Legally Separated    Spouse name: Not on file  . Number of children: Not on file  . Years of education: Not on file  . Highest education level: Not on file  Occupational History  . Occupation: retired Occupational hygienist  Tobacco Use  . Smoking status: Former Smoker    Packs/day: 1.50    Years: 41.00    Pack years: 61.50    Types: Cigarettes    Quit date: 10/12/2009    Years since quitting: 9.4  . Smokeless tobacco: Never Used  Substance and Sexual Activity  . Alcohol use: No  . Drug use: No  . Sexual activity: Not Currently    Birth control/protection: Surgical  Other Topics Concern  . Not on file  Social History Narrative   Admitted to [facility]   Former Smoker - stopped 2011   Alcohol  None   Full code   Social Determinants of Health   Financial Resource Strain:   . Difficulty of Paying Living Expenses: Not on file  Food Insecurity:   . Worried About Programme researcher, broadcasting/film/video in the Last Year: Not on file  . Ran Out of Food in the Last Year: Not on file  Transportation Needs:   . Lack of Transportation (Medical): Not on file  . Lack of Transportation (Non-Medical): Not on file  Physical Activity:   . Days of Exercise per Week: Not on file  . Minutes of Exercise per Session: Not on file  Stress:   . Feeling of Stress : Not on file  Social Connections:   . Frequency of Communication with Friends and Family: Not on file  . Frequency of Social Gatherings with Friends and Family: Not on file  . Attends  Religious Services: Not on file  . Active Member of  Clubs or Organizations: Not on file  . Attends Banker Meetings: Not on file  . Marital Status: Not on file  Intimate Partner Violence:   . Fear of Current or Ex-Partner: Not on file  . Emotionally Abused: Not on file  . Physically Abused: Not on file  . Sexually Abused: Not on file    Family History:    Family History  Problem Relation Age of Onset  . Hypertension Mother   . Heart failure Mother   . Heart failure Father   . Stroke Father   . Diabetes Father      ROS:  Please see the history of present illness.  No fevers chills nausea vomiting syncope bleeding All other ROS reviewed and negative.     Physical Exam/Data:   Vitals:   03/20/19 1315 03/20/19 1330 03/20/19 1345 03/20/19 1400  BP: 129/75 127/69 125/89 120/81  Pulse: 98 100 (!) 107 (!) 107  Resp: (!) 32 (!) 25 16 (!) 27  Temp:      SpO2: 97% 92% 93% 94%   No intake or output data in the 24 hours ending 03/20/19 1419 Last 3 Weights 03/15/2019 03/14/2019 03/12/2019  Weight (lbs) 164 lb 10.9 oz 166 lb 7.2 oz 166 lb 3.6 oz  Weight (kg) 74.7 kg 75.5 kg 75.4 kg     There is no height or weight on file to calculate BMI.  General:  Well nourished, well developed, in no acute distress HEENT: normal Lymph: no adenopathy Neck: no JVD Endocrine:  No thryomegaly Vascular: No carotid bruits; FA pulses 2+ bilaterally without bruits  Cardiac:  normal S1, S2; IRRR; no murmur  Lungs:  clear to auscultation bilaterally, no wheezing, rhonchi or rales  Abd: soft, nontender, no hepatomegaly  Ext: 1+ bilateral edema, mild tenderness Musculoskeletal:  No deformities, BUE and BLE strength normal and equal Skin: warm and dry  Neuro:  CNs 2-12 intact, no focal abnormalities noted Psych:  Normal affect   EKG:  The EKG was personally reviewed and demonstrates: 03/20/2019 at 11:41 AM shows atrial fibrillation 125 bpm, baseline wandering artifact.  Diffuse ST segment  depression.  Subsequent EKG appears to have P waves present however this may be representative of coarse atrial fibrillation.  ST depressions once again noted diffusely with aVR elevation suggestive of generalized global ischemia.   Telemetry:  Telemetry was personally reviewed and demonstrates: Atrial fibrillation 100-120  Relevant CV Studies:  ECHO 03/13/2019 1. Diffuse hypokinesis worse in the septum and inferior base. Left  ventricular ejection fraction, by estimation, is 25 to 30%. The left  ventricle has moderate to severely decreased function. The left ventricle  demonstrates global hypokinesis. The left  ventricular internal cavity size was moderately dilated. Left ventricular  diastolic parameters are consistent with Grade III diastolic dysfunction  (restrictive). Elevated left ventricular end-diastolic pressure.  2. Right ventricular systolic function is normal. The right ventricular  size is normal. There is moderately elevated pulmonary artery systolic  pressure.  3. Left atrial size was moderately dilated.  4. Right atrial size was mildly dilated.  5. The mitral valve is normal in structure and function. Mild mitral  valve regurgitation. No evidence of mitral stenosis.  6. The aortic valve is normal in structure and function. Aortic valve  regurgitation is not visualized. No aortic stenosis is present.   Cardiac catheterization 05/11/2018:  Significant coronary calcification with eccentric 80% mid distal left main stenosis, mild diffuse irregularity of the LAD with 30% stenoses, and a large  dominant left calcified left circumflex coronary artery with previously placed stent in the proximal circumflex with focal mid instent 85% stenosis, as well as segmental 40 and 50% mid AV groove stenoses.  The RCA is a small nondominant normal vessel.  Moderate LV dysfunction with an EF estimated at 40 to 45% with hypocontractility involving the mid anterolateral wall and basal mid  inferior wall.  LVEDP elevated at 28 mmHg.  Diagnostic Dominance: Left     Laboratory Data:  High Sensitivity Troponin:   Recent Labs  Lab 03/11/19 1823 03/11/19 2030 03/12/19 0344 03/20/19 1157  TROPONINIHS 123* 110* 115* 63*     Chemistry Recent Labs  Lab 03/14/19 0736 03/15/19 0402 03/20/19 1157  NA 139 139 137  K 4.6 4.5 5.8*  CL 110 105 104  CO2 22 23 17*  GLUCOSE 130* 133* 229*  BUN 39* 43* 50*  CREATININE 1.51* 1.45* 1.77*  CALCIUM 9.1 9.2 9.3  GFRNONAA 35* 37* 29*  GFRAA 41* 43* 34*  ANIONGAP 7 11 16*    Recent Labs  Lab 03/20/19 1157  PROT 7.3  ALBUMIN 3.5  AST 20  ALT 16  ALKPHOS 101  BILITOT 0.7   Hematology Recent Labs  Lab 03/20/19 1157  WBC 8.8  RBC 4.15  HGB 11.3*  HCT 35.9*  MCV 86.5  MCH 27.2  MCHC 31.5  RDW 15.0  PLT 313   BNP Recent Labs  Lab 03/20/19 1157  BNP 2,161.2*    DDimer No results for input(s): DDIMER in the last 168 hours.   Radiology/Studies:  DG Chest Port 1 View  Result Date: 03/20/2019 CLINICAL DATA:  Chest pain and shortness of breath beginning this morning. EXAM: PORTABLE CHEST 1 VIEW COMPARISON:  03/11/2019 and 02/19/2018 FINDINGS: Lungs are adequately inflated without lobar consolidation or effusion. Mild stable diffuse interstitial prominence. Mild stable cardiomegaly. Remainder of the exam is unchanged. IMPRESSION: No acute findings. Mild stable cardiomegaly and bilateral interstitial prominence. Electronically Signed   By: Elberta Fortis M.D.   On: 03/20/2019 12:18         Assessment and Plan:   Atrial fibrillation paroxysmal -Original EKG appears to be atrial fibrillation heart rate 125 bpm which was accompanied with chest discomfort, ST segment depression suggestive of generalized or global ischemia.  This makes sense with her underlying coronary artery disease that is not amenable to further PCI or amenable to CABG.  Surgical consultation has been made in the past.  We will continue with  medical management. -Given her ejection fraction in the 25% range on last admission, she is already on Toprol 75 mg a day.  We will utilize short acting Toprol, 50 every 6 p.o. to see if we can help decrease her overall heart rate. -We will avoid calcium channel blocker such as diltiazem given her markedly reduced ejection fraction. -In regards to anticoagulation, she is at high risk for stroke with CHA2DS2-VASc of at least 8 (CHF, hypertension, age greater than 48, diabetes, female, peripheral vascular disease, prior stroke).  We will start her on Eliquis 10 mg now then 5 mg twice a day thereafter in the event that TEE cardioversion is needed.  We will discontinue her aspirin and continue with Plavix and Eliquis to help reduce her bleeding risks.  Acute kidney injury -Creatinine continues to climb.  Was 1.1 on the 12th, currently 1.8 up from prior discharge.  We will hold off on any further aggressive diuresis.  Her hemoglobin is increased from 9.6-11.3, possibly dilutional.  BUN  is also increased to 50. -From review of prior records, her creatinine trended up to 2.0 previously in April 2020.  Chronic systolic heart failure -BNP remains elevated at 2100.  Severe coronary artery disease -Unfortunately not amenable to PCI or surgical intervention. -Continue with high-dose isosorbide 120.  Elevated troponin -63 down from 115 prior admission.  Her elevated troponin is compatible with her underlying cardiomyopathy, atrial fibrillation and although this does not represent acute coronary syndrome, it does worsen her overall prognosis and morbidity mortality.  Peripheral vascular disease -Prior femoral-popliteal bypass.  Lower extremity Doppler in September 2019 showed patent bypass graft.  We will try to continue at least one antiplatelet agent, Plavix with her apixaban.  Diabetes with hypertension and hyperlipidemia -Continue with high intensity statin.  History of prior stroke -Continue with  aggressive secondary risk factor prevention.       For questions or updates, please contact [facility] Please consult [email or web address] for contact info under     Signed, Donato Schultz, MD  03/20/2019 2:19 PM

## 2019-03-20 NOTE — ED Notes (Signed)
Dr skaines-cardiologist at bedside

## 2019-03-20 NOTE — ED Notes (Signed)
Pt assisted to call her daughter.

## 2019-03-20 NOTE — Progress Notes (Signed)
Patient is complaining of trouble breathing, lower lobes were assessed to have crackles. Oxygen saturation at 99%, [facility] paged awaiting orders. Will continue to monitor.   Bari Edward, RN

## 2019-03-20 NOTE — ED Notes (Signed)
Education on bleeding risk

## 2019-03-20 NOTE — ED Notes (Signed)
EDP Madilyn Hook informed of pt'd critical troponin of 1,069.

## 2019-03-20 NOTE — ED Provider Notes (Addendum)
[facility] EMERGENCY DEPARTMENT Provider Note   CSN: [id number] Arrival date & time: 03/20/19  1139     History Chief Complaint  Patient presents with  . Chest Pain  . Atrial Fibrillation    [name] [name] is a 69 y.o. female.  The history is provided by the patient, the EMS personnel and medical records. No language interpreter was used.   [name] [name] is a 69 y.o. female who presents to the Emergency Department complaining of chest pain.  She presents to the ED complaining of central chest pain that began around 9 am.  She awoke at 4am with cough.  She was recently admitted to the hospital for chf exacerbation.  Pain is described as central chest heaviness with associated SOB.  No prior similar sxs.  EMS report afib with RVR on EKG.  She received ASA 324 and two NTG prior to ED arrival. Denies fevers, vomiting. No known COVID 19 exposures.    Past Medical History:  Diagnosis Date  . Anemia 10/2015   Acute Blood Loss  . Arthritis    "feel like I have it all over" (08/28/2015)  . CHF (congestive heart failure) (HCC)   . Complication of anesthesia    DIFFICULT WAKING "only when I was smoking; no problems since I quit"  . Coronary artery disease   . Family history of adverse reaction to anesthesia    sister slow to wake up  . GERD (gastroesophageal reflux disease)    takes Protonix daily   . Hip bursitis   . History of blood transfusion    10/2015  . Hyperlipidemia LDL goal < 70 06/28/2013   takes Atorvastatin daily  . Hypertension    takes Metoprolol and Imdur daily  . Hypoxia 01/2018  . Malnutrition (HCC)   . Migraine    "none in a long time" (08/28/2015)  . Myocardial infarction (HCC) 2011  . Neuromuscular disorder (HCC)    DIABETIC NEUROPATHY  . Osteomyelitis (HCC) 2017   Left foot  . PAD (peripheral artery disease) (HCC)   . Peripheral vascular disease (HCC)   . Respiratory failure (HCC) 10/2015   Acute Hypoxia- acute pulmonary edema 11/13/2015   . Septic shock (HCC) 10/2015  . Stroke (HCC)   . Type II diabetes mellitus (HCC)    takes Lantus nightly.Average fasting blood sugar runs 80-90  Type II    Patient Active Problem List   Diagnosis Date Noted  . Dyspnea 03/11/2019  . Elevated troponin 03/11/2019  . Chest pain 03/11/2019  . Acute on chronic combined systolic and diastolic CHF (congestive heart failure) (HCC) 05/08/2018  . Renal insufficiency 02/13/2018  . CKD (chronic kidney disease) stage 3, GFR 30-59 ml/min 02/13/2018  . Acute systolic CHF (congestive heart failure) (HCC) 02/13/2018  . Benign hypertension 11/26/2017  . Gangrene of right foot (HCC) 11/01/2016  . GERD (gastroesophageal reflux disease) 11/01/2016  . Gangrene of left foot (HCC) 10/17/2016  . Sepsis (HCC) 10/17/2016  . Midfoot ulcer, left, limited to breakdown of skin (HCC) 01/08/2016  . Thigh hematoma 12/22/2015  . History of blood transfusion 12/22/2015  . Acute on chronic systolic CHF (congestive heart failure) (HCC) 12/22/2015  . H/O skin graft 12/11/2015  . SEMI (subendocardial myocardial infarction) (HCC) 11/19/2015  . DM type 2 with diabetic peripheral neuropathy (HCC) 11/19/2015  . Traumatic hemorrhagic shock (HCC)   . Chronic systolic CHF (congestive heart failure) (HCC)   . Uncontrolled type 2 diabetes mellitus with complication (HCC)   .  Demand myocardial infarction (HCC) 11/08/2015  . Surgery, elective   . Central line infection, initial encounter   . Septic shock (HCC) 11/03/2015  . Encephalopathy acute 11/03/2015  . Acute blood loss as cause of postoperative anemia 11/03/2015  . Acute respiratory failure with hypoxia (HCC)   . CVA (cerebral vascular accident) (HCC)   . S/P femoral-popliteal bypass surgery   . Malnutrition of moderate degree 11/01/2015  . Diabetic ulcer of left midfoot associated with diabetes mellitus due to underlying condition, with muscle involvement without evidence of necrosis (HCC)   . Hypertensive heart  disease with chronic diastolic congestive heart failure (HCC) 10/31/2015  . Osteomyelitis of left foot (HCC) 10/30/2015  . PVD (peripheral vascular disease) with claudication (HCC) 09/28/2015  . Critical lower limb ischemia 08/28/2015  . Hyperlipidemia with target LDL less than 70 06/28/2013  . Diabetes mellitus type II, uncontrolled (HCC) 03/03/2013  . Complicated migraine 03/03/2013  . Stroke-like symptom 03/02/2013  . Diabetes mellitus (HCC) 03/02/2013  . Aphasia 03/02/2013  . Headache(784.0) 03/02/2013  . PAD (peripheral artery disease) (HCC) 06/05/2011  . Coronary artery disease involving native coronary artery of native heart without angina pectoris 06/05/2011  . Hypotension 06/05/2011  . Claudication in peripheral vascular disease:  Lifestyle limiting. 06/05/2011  . Hx of tobacco use, presenting hazards to health 06/05/2011    Past Surgical History:  Procedure Laterality Date  . ABDOMINAL HYSTERECTOMY    . AMPUTATION Right 10/22/2016   Procedure: AMPUTATION DIGIT RIGHT TOES 1-3 POSSIBLE TRANSMETATARSAL;  Surgeon: Chuck Hint, MD;  Location: [facility] OR;  Service: Vascular;  Laterality: Right;  . APPENDECTOMY    . ATHERECTOMY N/A 06/04/2011   Procedure: ATHERECTOMY;  Surgeon: Runell Gess, MD;  Location: [facility] CATH LAB;  Service: Cardiovascular;  Laterality: N/A;  . CARDIAC CATHETERIZATION  10/13/2009   95% stenosis in the AV groove circumflex and 95% ostial stenosis in small OM3. A 3x56mm drug-eluting Promus stent inserted ito the circumflex. Dilatated with a 3.25x49mm noncompliant Quantum balloon within entire segment. The entire region was reduced to 0% and brisk TIMI3 flow.  . CAROTID DUPLEX  03/19/2011   Right ICA-demonstrates complete occlusion. Left ICA-demonstrates a small amount of fibrous plaque.  Marland Kitchen CATARACT EXTRACTION W/ INTRAOCULAR LENS IMPLANT Right   . CESAREAN SECTION  1990  . CORONARY ANGIOPLASTY    . ENDARTERECTOMY FEMORAL Left 09/05/2015   Procedure:  ENDARTERECTOMY FEMORAL WITH PROFUNDOPLASTY;  Surgeon: Chuck Hint, MD;  Location: [facility] OR;  Service: Vascular;  Laterality: Left;  Left common femoral artery vein patch using left saphenous vien  . ENDARTERECTOMY FEMORAL Right 10/22/2016   Procedure: ENDARTERECTOMY RIGHT COMMON FEMORAL;  Surgeon: Chuck Hint, MD;  Location: [facility] OR;  Service: Vascular;  Laterality: Right;  . FEMORAL-POPLITEAL BYPASS GRAFT Left 11/02/2015   Procedure: BYPASS GRAFT FEMORAL-POPLITEAL ARTERY VS FEMORAL-TIBIAL ARTERY BYPASS;  Surgeon: Chuck Hint, MD;  Location: [facility] OR;  Service: Vascular;  Laterality: Left;  . FEMORAL-POPLITEAL BYPASS GRAFT Right 10/22/2016   Procedure: BYPASS GRAFT RIGHT FEMORAL- BELOW KNEE POPLITEAL ARTERY WITH VEIN;  Surgeon: Chuck Hint, MD;  Location: [facility] OR;  Service: Vascular;  Laterality: Right;  . I & D EXTREMITY Left 11/10/2015   Procedure: Debridement Left Foot Ulcer, Application  Wound VAC;  Surgeon: [name], MD;  Location: [facility] OR;  Service: Orthopedics;  Laterality: Left;  . ILIAC ARTERY STENT Left 08/28/2015   common  . INTRAOPERATIVE ARTERIOGRAM Left 09/05/2015   Procedure: INTRA OPERATIVE ARTERIOGRAM;  Surgeon: Di Kindle  Edilia Bo, MD;  Location: [facility] OR;  Service: Vascular;  Laterality: Left;  . INTRAOPERATIVE ARTERIOGRAM Left 11/02/2015   Procedure: INTRA OPERATIVE ARTERIOGRAM;  Surgeon: Chuck Hint, MD;  Location: [facility] OR;  Service: Vascular;  Laterality: Left;  . INTRAOPERATIVE ARTERIOGRAM Right 10/22/2016   Procedure: INTRA OPERATIVE ARTERIOGRAM;  Surgeon: Chuck Hint, MD;  Location: [facility] OR;  Service: Vascular;  Laterality: Right;  . LEFT HEART CATH AND CORONARY ANGIOGRAPHY N/A 05/11/2018   Procedure: LEFT HEART CATH AND CORONARY ANGIOGRAPHY;  Surgeon: Lennette Bihari, MD;  Location: [facility] INVASIVE CV LAB;  Service: Cardiovascular;  Laterality: N/A;  Eugenie Birks MYOVIEW  10/25/2010   Moderate perfusion defect due to infarct/scar with  mild perinfarct ischemia seen in the Basal Inferolateral, Basal Anterolateral, Mid Inferolateral, and Mid Anterolateral regions. Post-stress EF is 50%.  . LOWER EXTREMITY ANGIOGRAPHY N/A 10/17/2016   Procedure: Lower Extremity Angiography;  Surgeon: Runell Gess, MD;  Location: [facility] INVASIVE CV LAB;  Service: Cardiovascular;  Laterality: N/A;  . OVARY SURGERY  1983?   "ruptured"  . PERIPHERAL VASCULAR ANGIOGRAM  01/26/2010   High-grade SFA disease: left greater than right. Left SFA would require fem-pop bypass grafting. Right SFA could be stented but might require Diamondback Orbital atherectomy.  Marland Kitchen PERIPHERAL VASCULAR ANGIOGRAM  02/23/2010   Stealth Predator orbital rotational atherectomy performed on SFA & Popliteal up to 90,000 RPM. Stenting using overlapping 5x199mm and 5x32mm Absolute Pro Nitinol self-expanding stents beginning just at the knee up to the mid SFA resulting in reduction of 90-95% calcified SFA & Popliteal stenosis to 0. Stenting performed on the distal common & proximal iliac artery with a 10x4 Absolute Pro- 70-0%.  Marland Kitchen PERIPHERAL VASCULAR ANGIOGRAM  06/17/2010   PTA performed to the right external iliac artery stent using a 5x100 balloon at 10 atmospheres. Stenting performed using a 6x18 Genesis on Opta balloon. Postdilatation with a 7x2 balloon resulting in a 95% "in-stent" stenosis to 0% residual.  . PERIPHERAL VASCULAR ANGIOGRAM  06/04/2011   Bilateral total SFAs not percutaneously addressable. Good canidate for femoropopliteal bypass grafting  . PERIPHERAL VASCULAR ANGIOGRAM  08/28/2015  . PERIPHERAL VASCULAR CATHETERIZATION N/A 08/28/2015   Procedure: Lower Extremity Angiography;  Surgeon: Runell Gess, MD;  Location: [facility] INVASIVE CV LAB;  Service: Cardiovascular;  Laterality: N/A;  . PERIPHERAL VASCULAR CATHETERIZATION N/A 08/28/2015   Procedure: Abdominal Aortogram;  Surgeon: Runell Gess, MD;  Location: [facility] INVASIVE CV LAB;  Service: Cardiovascular;  Laterality: N/A;  .  PERIPHERAL VASCULAR CATHETERIZATION Left 08/28/2015   Procedure: Peripheral Vascular Intervention;  Surgeon: Runell Gess, MD;  Location: [facility] INVASIVE CV LAB;  Service: Cardiovascular;  Laterality: Left;  common iliac  . PERIPHERAL VASCULAR CATHETERIZATION Left 08/28/2015   Procedure: Peripheral Vascular Atherectomy;  Surgeon: Runell Gess, MD;  Location: [facility] INVASIVE CV LAB;  Service: Cardiovascular;  Laterality: Left;  common iliac  . PERIPHERAL VASCULAR CATHETERIZATION N/A 09/28/2015   Procedure: Lower Extremity Angiography;  Surgeon: Runell Gess, MD;  Location: [facility] INVASIVE CV LAB;  Service: Cardiovascular;  Laterality: N/A;  . PERIPHERAL VASCULAR CATHETERIZATION Left 09/28/2015   Procedure: Peripheral Vascular Intervention;  Surgeon: Runell Gess, MD;  Location: [facility] INVASIVE CV LAB;  Service: Cardiovascular;  Laterality: Left CFA  PCI with 9 mm x 4 cm Abbott nitinol absolute Pro self-expanding stent     . SKIN SPLIT GRAFT Left 12/01/2015   Procedure: LEFT FOOT SKIN GRAFT AND VAC;  Surgeon: [name], MD;  Location: [facility] OR;  Service: Orthopedics;  Laterality: Left;  . THROMBECTOMY FEMORAL ARTERY Right 10/22/2016   Procedure: THROMBECTOMY FEMORAL ARTERY;  Surgeon: Chuck Hint, MD;  Location: [facility] OR;  Service: Vascular;  Laterality: Right;  . TRANSTHORACIC ECHOCARDIOGRAM  10/17/2009   EF 45-50%, moderate hypokinesis of the entire inferolateral myocardium, mild concentric hypertrophy and mild regurg of the mitral valva.  Marland Kitchen VEIN HARVEST Left 11/02/2015   Procedure: LEFT GREATER SAPHENOUS VEIN HARVEST;  Surgeon: Chuck Hint, MD;  Location: [facility] OR;  Service: Vascular;  Laterality: Left;     OB History   No obstetric history on file.     Family History  Problem Relation Age of Onset  . Hypertension Mother   . Heart failure Mother   . Heart failure Father   . Stroke Father   . Diabetes Father     Social History   Tobacco Use  . Smoking status: Former Smoker      Packs/day: 1.50    Years: 41.00    Pack years: 61.50    Types: Cigarettes    Quit date: 10/12/2009    Years since quitting: 9.4  . Smokeless tobacco: Never Used  Substance Use Topics  . Alcohol use: No  . Drug use: No    Home Medications Prior to Admission medications   Medication Sig Start Date End Date Taking? Authorizing Provider  acetaminophen (TYLENOL) 325 MG tablet Take 650 mg by mouth every 6 (six) hours as needed for mild pain, fever or headache.    [provider]  aspirin EC 81 MG tablet Take 81 mg by mouth daily.    [provider]  atorvastatin (LIPITOR) 80 MG tablet Take 80 mg by mouth at bedtime.     [provider]  clopidogrel (PLAVIX) 75 MG tablet Take 75 mg by mouth daily.     [provider]  furosemide (LASIX) 40 MG tablet Take 1 tablet (40 mg total) by mouth daily. 03/15/19   Joseph Art, DO  gabapentin (NEURONTIN) 300 MG capsule Take 1 capsule (300 mg total) by mouth at bedtime. Patient taking differently: Take 300 mg by mouth daily as needed (for nerve pain).  05/13/18   Zannie Cove, MD  isosorbide mononitrate (IMDUR) 120 MG 24 hr tablet Take 1 tablet (120 mg total) by mouth daily. 03/15/19   Joseph Art, DO  LEVEMIR FLEXTOUCH 100 UNIT/ML Pen Inject 35 Units into the skin at bedtime.  03/15/16   [provider]  metFORMIN (GLUCOPHAGE) 1000 MG tablet Take 1,000 mg by mouth 2 (two) times daily with a meal.    [provider]  metoprolol succinate (TOPROL-XL) 50 MG 24 hr tablet TAKE 1 AND 1/2 TABLETS BY MOUTH DAILY Patient taking differently: Take 75 mg by mouth daily.  04/15/18   Lennette Bihari, MD  nitroGLYCERIN (NITROSTAT) 0.4 MG SL tablet Place 0.4 mg under the tongue every 5 (five) minutes as needed for chest pain.  05/15/18   [provider]  pantoprazole (PROTONIX) 40 MG tablet Take 1 tablet (40 mg total) by mouth daily. 12/21/18   Lennette Bihari, MD  VENTOLIN HFA 108 (90 Base) MCG/ACT  inhaler Inhale 1 puff into the lungs every 4 (four) hours as needed for shortness of breath. 03/02/18   [provider]    Allergies    Doxycycline, Hydrochlorothiazide, Latex, and Penicillins  Review of Systems   Review of Systems  All other systems reviewed and are negative.   Physical Exam Updated Vital  Signs BP 105/74   Pulse (!) 110   Temp 97.7 F (36.5 C)   Resp (!) 29   SpO2 95%   Physical Exam Vitals and nursing note reviewed.  Constitutional:      Appearance: She is well-developed.  HENT:     Head: Normocephalic and atraumatic.  Cardiovascular:     Rate and Rhythm: Tachycardia present. Rhythm irregular.     Heart sounds: No murmur.  Pulmonary:     Effort: Pulmonary effort is normal. No respiratory distress.     Breath sounds: Normal breath sounds.  Abdominal:     Palpations: Abdomen is soft.     Tenderness: There is no abdominal tenderness. There is no guarding or rebound.  Musculoskeletal:        General: No tenderness.     Comments: 1+ pitting edema to BLE  Skin:    General: Skin is warm and dry.  Neurological:     Mental Status: She is alert and oriented to person, place, and time.     Comments: Generalized weakness  Psychiatric:        Behavior: Behavior normal.     ED Results / Procedures / Treatments   Labs (all labs ordered are listed, but only abnormal results are displayed) Labs Reviewed  COMPREHENSIVE METABOLIC PANEL - Abnormal; Notable for the following components:      Result Value   Potassium 5.8 (*)    CO2 17 (*)    Glucose, Bld 229 (*)    BUN 50 (*)    Creatinine, Ser 1.77 (*)    GFR calc non Af Amer 29 (*)    GFR calc Af Amer 34 (*)    Anion gap 16 (*)    All other components within normal limits  CBC WITH DIFFERENTIAL/PLATELET - Abnormal; Notable for the following components:   Hemoglobin 11.3 (*)    HCT 35.9 (*)    All other components within normal limits  BRAIN NATRIURETIC PEPTIDE - Abnormal; Notable for the  following components:   B Natriuretic Peptide 2,161.2 (*)    All other components within normal limits  TROPONIN I (HIGH SENSITIVITY) - Abnormal; Notable for the following components:   Troponin I (High Sensitivity) 63 (*)    All other components within normal limits  TROPONIN I (HIGH SENSITIVITY) - Abnormal; Notable for the following components:   Troponin I (High Sensitivity) 1,069 (*)    All other components within normal limits  SARS CORONAVIRUS 2 (TAT 6-24 HRS)    EKG EKG Interpretation  Date/Time:  Saturday March 20 2019 12:29:46 EST Ventricular Rate:  104 PR Interval:    QRS Duration: 117 QT Interval:  443 QTC Calculation: 583 R Axis:   96 Text Interpretation: Sinus tachycardia Nonspecific intraventricular conduction delay Repol abnrm, severe global ischemia (LM/MVD) Confirmed by Quintella Reichert [phone]) on 03/20/2019 12:32:12 PM   Radiology DG Chest Port 1 View  Result Date: 03/20/2019 CLINICAL DATA:  Chest pain and shortness of breath beginning this morning. EXAM: PORTABLE CHEST 1 VIEW COMPARISON:  03/11/2019 and 02/19/2018 FINDINGS: Lungs are adequately inflated without lobar consolidation or effusion. Mild stable diffuse interstitial prominence. Mild stable cardiomegaly. Remainder of the exam is unchanged. IMPRESSION: No acute findings. Mild stable cardiomegaly and bilateral interstitial prominence. Electronically Signed   By: Marin Olp M.D.   On: 03/20/2019 12:18    Procedures Procedures (including critical care time) CRITICAL CARE Performed by: Quintella Reichert   Total critical care time: 45 minutes  Critical care time  was exclusive of separately billable procedures and treating other patients.  Critical care was necessary to treat or prevent imminent or life-threatening deterioration.  Critical care was time spent personally by me on the following activities: development of treatment plan with patient and/or surrogate as well as nursing, discussions with  consultants, evaluation of patient's response to treatment, examination of patient, obtaining history from patient or surrogate, ordering and performing treatments and interventions, ordering and review of laboratory studies, ordering and review of radiographic studies, pulse oximetry and re-evaluation of patient's condition.  Medications Ordered in ED Medications  apixaban (ELIQUIS) tablet 10 mg (has no administration in time range)  apixaban (ELIQUIS) tablet 5 mg (has no administration in time range)  metoprolol tartrate (LOPRESSOR) tablet 50 mg (50 mg Oral Given 03/20/19 1523)  diltiazem (CARDIZEM) 1 mg/mL load via infusion 10 mg (10 mg Intravenous Bolus from Bag 03/20/19 1217)  heparin bolus via infusion 4,000 Units (4,000 Units Intravenous Bolus from Bag 03/20/19 1315)  sodium zirconium cyclosilicate (LOKELMA) packet 10 g (10 g Oral Given 03/20/19 1311)  metoprolol tartrate (LOPRESSOR) injection 2.5 mg (2.5 mg Intravenous Given 03/20/19 1449)    ED Course  I have reviewed the triage vital signs and the nursing notes.  Pertinent labs & imaging results that were available during my care of the patient were reviewed by me and considered in my medical decision making (see chart for details).    MDM Rules/Calculators/A&P                      Patient with history of CHF here for evaluation of chest pain and shortness of breath. Patient presented in a fib with RVR, EKG with ischemic changes. She was treated with Cardizem for rate control. With improvement of her heart rate her chest pressure resolved. Labs demonstrate mild elevation in her potassium, BUN and creatinine. She was treated with lokelma. Cardizem was transition to metoprolol after discussion with cardiology. Discussed the case with Dr. Anne Fu with cardiology, who will see the patient and consult. Hospitalist consulted for admission.  Final Clinical Impression(s) / ED Diagnoses Final diagnoses:  Atrial fibrillation with RVR (HCC)    Hyperkalemia    Rx / DC Orders ED Discharge Orders    None       Tilden Fossa, MD 03/20/19 1540    Tilden Fossa, MD 03/20/19 [phone]

## 2019-03-20 NOTE — ED Notes (Signed)
Patient noted to be in and out of Afib/sinus tach on cardiac monitor. Dr. Madilyn Hook aware.

## 2019-03-20 NOTE — ED Triage Notes (Signed)
Pt arrives via [facility] from home with c/o substernal chest pain that began earlier this morning. Pt had 2 sl nitro and 324 mg asa prior to ems arrival. Pt reports recent dc from hospital for fluid overload. Pt denies fevers, chills or recent sick contacts. Ems states pt was in Afib on their monitor with intermittent RVR-HR of 180. Pt was given 200cc NS and HR improved to 120-130. Pt a/ox4. resp e/u, nad.

## 2019-03-20 NOTE — ED Notes (Signed)
Patient's HR now 102-103 on cardiac monitor. Pt's cardiac monitor showing conversion from Afib to sinus tach, repeat EKG captured and given to Dr. Madilyn Hook.

## 2019-03-20 NOTE — H&P (Signed)
History and Physical    [name] [name] MWN:[id number] DOB: 11-02-1950 DOA: 03/20/2019  PCP: Georgann Housekeeper, MD  Patient coming from: Home  Chief Complaint: Chest pain  HPI: [name] [name] is a 69 y.o. female with medical history significant of HFrEF (EF 25%), CAD, PVD, HTN, DM2, HLD, GERD, arthritis who presents for chest pain.  She was recently discharged on 03/14/18 for a hospitalization for volume overload.  She was diuresed well and she reports that she was feeling well this week, just with generalized weakness.  She woke up this morning, however, with a non productive cough.  She went back to sleep.  When she awoke again, she noted sharp/pressure like substernal chest pain, 10/10 in intensity.  It radiated to her arm and jaw and she thought she was having a heart attack.  She further had some mild nausea and diaphoresis.  She denied palpitations.  She called EMS and she was found to be in Afib.  She was given 200cc of IVF on the way to the hospital.  She now feels that her chest pain is improved.   ED Course: In the ED, she was initially started on a cardizem drip and heparin.  Cardiology was consulted and recommended QID beta blocker, eliquis (hold aspirin, continue plavix) given her depressed EF.  She was also noted to have a mildly elevated K to 5.8 (likely related to diuresis), worsening renal function with a Cr of 1.77 and BUN of 50 (ratio of 28 which suggests pre-renal), elevated BNP and elevated troponin.  Cardiology saw her and has given recommendations.   Review of Systems: As per HPI otherwise 10 point review of systems negative.   Past Medical History:  Diagnosis Date  . Anemia 10/2015   Acute Blood Loss  . Arthritis    "feel like I have it all over" (08/28/2015)  . CHF (congestive heart failure) (HCC)   . Complication of anesthesia    DIFFICULT WAKING "only when I was smoking; no problems since I quit"  . Coronary artery disease   . Family history of adverse reaction to anesthesia     sister slow to wake up  . GERD (gastroesophageal reflux disease)    takes Protonix daily   . Hip bursitis   . History of blood transfusion    10/2015  . Hyperlipidemia LDL goal < 70 06/28/2013   takes Atorvastatin daily  . Hypertension    takes Metoprolol and Imdur daily  . Hypoxia 01/2018  . Malnutrition (HCC)   . Migraine    "none in a long time" (08/28/2015)  . Myocardial infarction (HCC) 2011  . Neuromuscular disorder (HCC)    DIABETIC NEUROPATHY  . Osteomyelitis (HCC) 2017   Left foot  . PAD (peripheral artery disease) (HCC)   . Peripheral vascular disease (HCC)   . Respiratory failure (HCC) 10/2015   Acute Hypoxia- acute pulmonary edema 11/13/2015  . Septic shock (HCC) 10/2015  . Stroke (HCC)   . Type II diabetes mellitus (HCC)    takes Lantus nightly.Average fasting blood sugar runs 80-90  Type II    Past Surgical History:  Procedure Laterality Date  . ABDOMINAL HYSTERECTOMY    . AMPUTATION Right 10/22/2016   Procedure: AMPUTATION DIGIT RIGHT TOES 1-3 POSSIBLE TRANSMETATARSAL;  Surgeon: Chuck Hint, MD;  Location: [facility] OR;  Service: Vascular;  Laterality: Right;  . APPENDECTOMY    . ATHERECTOMY N/A 06/04/2011   Procedure: ATHERECTOMY;  Surgeon: Runell Gess, MD;  Location: [facility] CATH  LAB;  Service: Cardiovascular;  Laterality: N/A;  . CARDIAC CATHETERIZATION  10/13/2009   95% stenosis in the AV groove circumflex and 95% ostial stenosis in small OM3. A 3x22mm drug-eluting Promus stent inserted ito the circumflex. Dilatated with a 3.25x65mm noncompliant Quantum balloon within entire segment. The entire region was reduced to 0% and brisk TIMI3 flow.  . CAROTID DUPLEX  03/19/2011   Right ICA-demonstrates complete occlusion. Left ICA-demonstrates a small amount of fibrous plaque.  Marland Kitchen CATARACT EXTRACTION W/ INTRAOCULAR LENS IMPLANT Right   . CESAREAN SECTION  1990  . CORONARY ANGIOPLASTY    . ENDARTERECTOMY FEMORAL Left 09/05/2015   Procedure: ENDARTERECTOMY FEMORAL  WITH PROFUNDOPLASTY;  Surgeon: Angelia Mould, MD;  Location: [facility] OR;  Service: Vascular;  Laterality: Left;  Left common femoral artery vein patch using left saphenous vien  . ENDARTERECTOMY FEMORAL Right 10/22/2016   Procedure: ENDARTERECTOMY RIGHT COMMON FEMORAL;  Surgeon: Angelia Mould, MD;  Location: [place];  Service: Vascular;  Laterality: Right;  . FEMORAL-POPLITEAL BYPASS GRAFT Left 11/02/2015   Procedure: BYPASS GRAFT FEMORAL-POPLITEAL ARTERY VS FEMORAL-TIBIAL ARTERY BYPASS;  Surgeon: Angelia Mould, MD;  Location: [place];  Service: Vascular;  Laterality: Left;  . FEMORAL-POPLITEAL BYPASS GRAFT Right 10/22/2016   Procedure: BYPASS GRAFT RIGHT FEMORAL- BELOW KNEE POPLITEAL ARTERY WITH VEIN;  Surgeon: Angelia Mould, MD;  Location: [place];  Service: Vascular;  Laterality: Right;  . I & D EXTREMITY Left 11/10/2015   Procedure: Debridement Left Foot Ulcer, Application  Wound VAC;  Surgeon: Newt Minion, MD;  Location: [place];  Service: Orthopedics;  Laterality: Left;  . ILIAC ARTERY STENT Left 08/28/2015   common  . INTRAOPERATIVE ARTERIOGRAM Left 09/05/2015   Procedure: INTRA OPERATIVE ARTERIOGRAM;  Surgeon: Angelia Mould, MD;  Location: [place];  Service: Vascular;  Laterality: Left;  . INTRAOPERATIVE ARTERIOGRAM Left 11/02/2015   Procedure: INTRA OPERATIVE ARTERIOGRAM;  Surgeon: Angelia Mould, MD;  Location: [place];  Service: Vascular;  Laterality: Left;  . INTRAOPERATIVE ARTERIOGRAM Right 10/22/2016   Procedure: INTRA OPERATIVE ARTERIOGRAM;  Surgeon: Angelia Mould, MD;  Location: [place];  Service: Vascular;  Laterality: Right;  . LEFT HEART CATH AND CORONARY ANGIOGRAPHY N/A 05/11/2018   Procedure: LEFT HEART CATH AND CORONARY ANGIOGRAPHY;  Surgeon: Troy Sine, MD;  Location: [place] CV LAB;  Service: Cardiovascular;  Laterality: N/A;  [name] [name] MYOVIEW  10/25/2010   Moderate perfusion defect due to infarct/scar with mild perinfarct  ischemia seen in the Basal Inferolateral, Basal Anterolateral, Mid Inferolateral, and Mid Anterolateral regions. Post-stress EF is 50%.  . LOWER EXTREMITY ANGIOGRAPHY N/A 10/17/2016   Procedure: Lower Extremity Angiography;  Surgeon: Lorretta Harp, MD;  Location: [place] CV LAB;  Service: Cardiovascular;  Laterality: N/A;  . OVARY SURGERY  1983?   "ruptured"  . PERIPHERAL VASCULAR ANGIOGRAM  01/26/2010   High-grade SFA disease: left greater than right. Left SFA would require fem-pop bypass grafting. Right SFA could be stented but might require Diamondback Orbital atherectomy.  Marland Kitchen PERIPHERAL VASCULAR ANGIOGRAM  02/23/2010   Stealth Predator orbital rotational atherectomy performed on SFA & Popliteal up to 90,000 RPM. Stenting using overlapping 5x122mm and 5x36mm Absolute Pro Nitinol self-expanding stents beginning just at the knee up to the mid SFA resulting in reduction of 90-95% calcified SFA & Popliteal stenosis to 0. Stenting performed on the distal common & proximal iliac artery with a 10x4 Absolute Pro- 70-0%.  Marland Kitchen PERIPHERAL VASCULAR ANGIOGRAM  06/17/2010   PTA performed  to the right external iliac artery stent using a 5x100 balloon at 10 atmospheres. Stenting performed using a 6x18 Genesis on Opta balloon. Postdilatation with a 7x2 balloon resulting in a 95% "in-stent" stenosis to 0% residual.  . PERIPHERAL VASCULAR ANGIOGRAM  06/04/2011   Bilateral total SFAs not percutaneously addressable. Good canidate for femoropopliteal bypass grafting  . PERIPHERAL VASCULAR ANGIOGRAM  08/28/2015  . PERIPHERAL VASCULAR CATHETERIZATION N/A 08/28/2015   Procedure: Lower Extremity Angiography;  Surgeon: Runell Gess, MD;  Location: [facility] INVASIVE CV LAB;  Service: Cardiovascular;  Laterality: N/A;  . PERIPHERAL VASCULAR CATHETERIZATION N/A 08/28/2015   Procedure: Abdominal Aortogram;  Surgeon: Runell Gess, MD;  Location: [facility] INVASIVE CV LAB;  Service: Cardiovascular;  Laterality: N/A;  . PERIPHERAL  VASCULAR CATHETERIZATION Left 08/28/2015   Procedure: Peripheral Vascular Intervention;  Surgeon: Runell Gess, MD;  Location: [facility] INVASIVE CV LAB;  Service: Cardiovascular;  Laterality: Left;  common iliac  . PERIPHERAL VASCULAR CATHETERIZATION Left 08/28/2015   Procedure: Peripheral Vascular Atherectomy;  Surgeon: Runell Gess, MD;  Location: [facility] INVASIVE CV LAB;  Service: Cardiovascular;  Laterality: Left;  common iliac  . PERIPHERAL VASCULAR CATHETERIZATION N/A 09/28/2015   Procedure: Lower Extremity Angiography;  Surgeon: Runell Gess, MD;  Location: [facility] INVASIVE CV LAB;  Service: Cardiovascular;  Laterality: N/A;  . PERIPHERAL VASCULAR CATHETERIZATION Left 09/28/2015   Procedure: Peripheral Vascular Intervention;  Surgeon: Runell Gess, MD;  Location: [facility] INVASIVE CV LAB;  Service: Cardiovascular;  Laterality: Left CFA  PCI with 9 mm x 4 cm Abbott nitinol absolute Pro self-expanding stent     . SKIN SPLIT GRAFT Left 12/01/2015   Procedure: LEFT FOOT SKIN GRAFT AND VAC;  Surgeon: [name], MD;  Location: [facility] OR;  Service: Orthopedics;  Laterality: Left;  . THROMBECTOMY FEMORAL ARTERY Right 10/22/2016   Procedure: THROMBECTOMY FEMORAL ARTERY;  Surgeon: Chuck Hint, MD;  Location: [facility] OR;  Service: Vascular;  Laterality: Right;  . TRANSTHORACIC ECHOCARDIOGRAM  10/17/2009   EF 45-50%, moderate hypokinesis of the entire inferolateral myocardium, mild concentric hypertrophy and mild regurg of the mitral valva.  Marland Kitchen VEIN HARVEST Left 11/02/2015   Procedure: LEFT GREATER SAPHENOUS VEIN HARVEST;  Surgeon: Chuck Hint, MD;  Location: [facility] OR;  Service: Vascular;  Laterality: Left;   Reviewed with patient.  Quit smoking in 2011 or 2012.   reports that she quit smoking about 9 years ago. Her smoking use included cigarettes. She has a 61.50 pack-year smoking history. She has never used smokeless tobacco. She reports that she does not drink alcohol or use drugs.  Allergies    Allergen Reactions  . Doxycycline Other (See Comments)    Lethargy   . Hydrochlorothiazide Other (See Comments)    Lethargy   . Latex Rash  . Penicillins Swelling and Rash    Pt states she has tolerated Keflex in the past without problems. States she may have tolerated Augmentin in the past but it caused GI upset. Has patient had a PCN reaction causing immediate rash, facial/tongue/throat swelling, SOB or lightheadedness with hypotension: Yes Has patient had a PCN reaction causing severe rash involving mucus membranes or skin necrosis: No Has patient had a PCN reaction that required hospitalization No Has patient had a PCN reaction occurring within the last 10 years: No   Reviewed with patient.  Family History  Problem Relation Age of Onset  . Hypertension Mother   . Heart failure Mother   . Heart failure Father   .  Stroke Father   . Diabetes Father      Prior to Admission medications   Medication Sig Start Date End Date Taking? Authorizing Provider  acetaminophen (TYLENOL) 325 MG tablet Take 650 mg by mouth every 6 (six) hours as needed for mild pain, fever or headache.    [provider]  aspirin EC 81 MG tablet Take 81 mg by mouth daily.    [provider]  atorvastatin (LIPITOR) 80 MG tablet Take 80 mg by mouth at bedtime.     [provider]  clopidogrel (PLAVIX) 75 MG tablet Take 75 mg by mouth daily.     [provider]  furosemide (LASIX) 40 MG tablet Take 1 tablet (40 mg total) by mouth daily. 03/15/19   Joseph Art, DO  gabapentin (NEURONTIN) 300 MG capsule Take 1 capsule (300 mg total) by mouth at bedtime. Patient taking differently: Take 300 mg by mouth daily as needed (for nerve pain).  05/13/18   Zannie Cove, MD  isosorbide mononitrate (IMDUR) 120 MG 24 hr tablet Take 1 tablet (120 mg total) by mouth daily. 03/15/19   Joseph Art, DO  LEVEMIR FLEXTOUCH 100 UNIT/ML Pen Inject 35 Units into the skin at bedtime.  03/15/16    [provider]  metFORMIN (GLUCOPHAGE) 1000 MG tablet Take 1,000 mg by mouth 2 (two) times daily with a meal.    [provider]  metoprolol succinate (TOPROL-XL) 50 MG 24 hr tablet TAKE 1 AND 1/2 TABLETS BY MOUTH DAILY Patient taking differently: Take 75 mg by mouth daily.  04/15/18   Lennette Bihari, MD  nitroGLYCERIN (NITROSTAT) 0.4 MG SL tablet Place 0.4 mg under the tongue every 5 (five) minutes as needed for chest pain.  05/15/18   [provider]  pantoprazole (PROTONIX) 40 MG tablet Take 1 tablet (40 mg total) by mouth daily. 12/21/18   Lennette Bihari, MD  VENTOLIN HFA 108 (90 Base) MCG/ACT inhaler Inhale 1 puff into the lungs every 4 (four) hours as needed for shortness of breath. 03/02/18   [provider]    Physical Exam: Vitals:   03/20/19 1445 03/20/19 1500 03/20/19 1515 03/20/19 1600  BP: 119/76 109/77 105/74 129/71  Pulse:   (!) 110 (!) 125  Resp:   (!) 29 (!) 24  Temp:      SpO2: 99% 96% 95% 98%    Constitutional: NAD, calm, comfortable, sitting up in bed Eyes:  lids and conjunctivae normal ENMT: Mucous membranes are mildly dry.  Neck is supple without any lymphadenopathy noted.  Respiratory: Mild crackles, clear with coughing.  No rales.  Otherwise clear, no wheezing Cardiovascular: Irreg Irreg, tachycardic.  No murmur.  She has cool and mildly edematous extremities with changes of chronic stasis.  Abdomen: No TTP, +BS, non distended Musculoskeletal: no clubbing / cyanosis. She is missing the first 3 toes on the right foot.  Well healed scars Skin: She has chronic changes to bilateral LE skin and dry flaking skin to mid shins Neurologic: Grossly intact, alert and oriented to person, place, time.  Psychiatric: Normal judgment and insight. Normal mood.    Labs on Admission: I have personally reviewed following labs and imaging studies  CBC: Recent Labs  Lab 03/20/19 1157  WBC 8.8  NEUTROABS 7.2  HGB 11.3*  HCT 35.9*  MCV 86.5    PLT 313   Basic Metabolic Panel: Recent Labs  Lab 03/14/19 0736 03/15/19 0402 03/20/19 1157  NA 139 139 137  K  4.6 4.5 5.8*  CL 110 105 104  CO2 22 23 17*  GLUCOSE 130* 133* 229*  BUN 39* 43* 50*  CREATININE 1.51* 1.45* 1.77*  CALCIUM 9.1 9.2 9.3   GFR: Estimated Creatinine Clearance: 30.1 mL/min (A) (by C-G formula based on SCr of 1.77 mg/dL (H)). Liver Function Tests: Recent Labs  Lab 03/20/19 1157  AST 20  ALT 16  ALKPHOS 101  BILITOT 0.7  PROT 7.3  ALBUMIN 3.5   No results for input(s): LIPASE, AMYLASE in the last 168 hours. No results for input(s): AMMONIA in the last 168 hours. Coagulation Profile: No results for input(s): INR, PROTIME in the last 168 hours. Cardiac Enzymes: No results for input(s): CKTOTAL, CKMB, CKMBINDEX, TROPONINI in the last 168 hours. BNP (last 3 results) No results for input(s): PROBNP in the last 8760 hours. HbA1C: No results for input(s): HGBA1C in the last 72 hours. CBG: Recent Labs  Lab 03/14/19 1715 03/14/19 2005 03/14/19 2351 03/15/19 0404 03/15/19 0744  GLUCAP 186* 201* 160* 127* 135*   Lipid Profile: No results for input(s): CHOL, HDL, LDLCALC, TRIG, CHOLHDL, LDLDIRECT in the last 72 hours. Thyroid Function Tests: No results for input(s): TSH, T4TOTAL, FREET4, T3FREE, THYROIDAB in the last 72 hours. Anemia Panel: No results for input(s): VITAMINB12, FOLATE, FERRITIN, TIBC, IRON, RETICCTPCT in the last 72 hours. Urine analysis:    Component Value Date/Time   COLORURINE YELLOW 09/05/2015 0641   APPEARANCEUR CLEAR 09/05/2015 0641   LABSPEC 1.013 09/05/2015 0641   PHURINE 6.0 09/05/2015 0641   GLUCOSEU 100 (A) 09/05/2015 0641   HGBUR TRACE (A) 09/05/2015 0641   BILIRUBINUR NEGATIVE 09/05/2015 0641   KETONESUR NEGATIVE 09/05/2015 0641   PROTEINUR 100 (A) 09/05/2015 0641   UROBILINOGEN 1.0 10/16/2009 0243   NITRITE NEGATIVE 09/05/2015 0641   LEUKOCYTESUR NEGATIVE 09/05/2015 0641    Radiological Exams on  Admission: DG Chest Port 1 View  Result Date: 03/20/2019 CLINICAL DATA:  Chest pain and shortness of breath beginning this morning. EXAM: PORTABLE CHEST 1 VIEW COMPARISON:  03/11/2019 and 02/19/2018 FINDINGS: Lungs are adequately inflated without lobar consolidation or effusion. Mild stable diffuse interstitial prominence. Mild stable cardiomegaly. Remainder of the exam is unchanged. IMPRESSION: No acute findings. Mild stable cardiomegaly and bilateral interstitial prominence. Electronically Signed   By: Elberta Fortis M.D.   On: 03/20/2019 12:18    EKG: Independently reviewed. Afib, rapid rate, diffuse ST segment depression.    Assessment/Plan Atrial fibrillation with RVR, paroxysmal Elevated troponin Chest pain - Cardiology is following - Admit to medical telemetry - Metoprolol 50mg  QID, monitor for rate control - Start Eliquis as noted in Dr. note - hold aspirin, continue plavix - Chest pain is improved - Avoid CCB - EKG for any recurrent chest pain    PAD (peripheral artery disease) (HCC) Coronary artery disease Chronic systolic CHF (congestive heart failure), EF of 25% - She is euvolemic, possibly mildly overdiuresed given renal function, hyperkalemia - Monitor I/O - Daily weight - Hold furosemide for tonight, restart home dose tomorrow - She received 200cc of fluids with EMS - Continue IMDUR - PRN NTG - EKG PRN for any recurrent chest pain - Continue plavix, statin - BNP elevated to 2K, stable from last admission    Diabetes mellitus - Last A1C 8.6.  She is taking Levemir 35 units and metformin - Continue levemir - SSI with meals    Hyperlipidemia with target LDL less than 70 - continue statin    AKI on CKD (chronic kidney  disease) stage 3, GFR 30-59 ml/min Elevated K - Lokelma given in the ED, recheck renal function in the morning - Recheck K this evening and add further treatment if needed, she will also receive her regularly dosed insulin tonight.  - Hold  furosemide and restart if K improved - Telemetry     DVT prophylaxis: Eliquis  Code Status: Full  Family Communication: Will call daughter [name] Disposition Plan: Admit for treatment  Consults called: Dr. Anne Fu, Cardiology  Admission status: INP, med telemetry    Debe Coder MD [facility]   If 7PM-7AM, please contact night-coverage [email or web address] Use universal [place] password for that web site. If you do not have the password, please call the hospital operator.  03/20/2019, 4:26 PM

## 2019-03-21 ENCOUNTER — Inpatient Hospital Stay (HOSPITAL_COMMUNITY): Payer: Medicare Other

## 2019-03-21 DIAGNOSIS — I4891 Unspecified atrial fibrillation: Secondary | ICD-10-CM

## 2019-03-21 LAB — BASIC METABOLIC PANEL
Anion gap: 11 (ref 5–15)
BUN: 55 mg/dL — ABNORMAL HIGH (ref 8–23)
CO2: 21 mmol/L — ABNORMAL LOW (ref 22–32)
Calcium: 9.2 mg/dL (ref 8.9–10.3)
Chloride: 104 mmol/L (ref 98–111)
Creatinine, Ser: 1.9 mg/dL — ABNORMAL HIGH (ref 0.44–1.00)
GFR calc Af Amer: 31 mL/min — ABNORMAL LOW (ref >60)
GFR calc non Af Amer: 27 mL/min — ABNORMAL LOW (ref >60)
Glucose, Bld: 118 mg/dL — ABNORMAL HIGH (ref 70–99)
Potassium: 5.3 mmol/L — ABNORMAL HIGH (ref 3.5–5.1)
Sodium: 136 mmol/L (ref 135–145)

## 2019-03-21 LAB — PROTIME-INR
INR: 1.9 — ABNORMAL HIGH (ref 0.8–1.2)
Prothrombin Time: 22 seconds — ABNORMAL HIGH (ref 11.4–15.2)

## 2019-03-21 LAB — URINALYSIS, ROUTINE W REFLEX MICROSCOPIC
Bacteria, UA: NONE SEEN
Bilirubin Urine: NEGATIVE
Glucose, UA: NEGATIVE mg/dL
Hgb urine dipstick: NEGATIVE
Ketones, ur: NEGATIVE mg/dL
Leukocytes,Ua: NEGATIVE
Nitrite: NEGATIVE
Protein, ur: 30 mg/dL — AB
Specific Gravity, Urine: 1.013 (ref 1.005–1.030)
pH: 5 (ref 5.0–8.0)

## 2019-03-21 LAB — CBC
HCT: 28.6 % — ABNORMAL LOW (ref 36.0–46.0)
Hemoglobin: 9.2 g/dL — ABNORMAL LOW (ref 12.0–15.0)
MCH: 27.4 pg (ref 26.0–34.0)
MCHC: 32.2 g/dL (ref 30.0–36.0)
MCV: 85.1 fL (ref 80.0–100.0)
Platelets: 320 10*3/uL (ref 150–400)
RBC: 3.36 MIL/uL — ABNORMAL LOW (ref 3.87–5.11)
RDW: 15.2 % (ref 11.5–15.5)
WBC: 9 10*3/uL (ref 4.0–10.5)
nRBC: 0 % (ref 0.0–0.2)

## 2019-03-21 LAB — GLUCOSE, CAPILLARY
Glucose-Capillary: 127 mg/dL — ABNORMAL HIGH (ref 70–99)
Glucose-Capillary: 191 mg/dL — ABNORMAL HIGH (ref 70–99)
Glucose-Capillary: 205 mg/dL — ABNORMAL HIGH (ref 70–99)
Glucose-Capillary: 242 mg/dL — ABNORMAL HIGH (ref 70–99)

## 2019-03-21 MED ORDER — LEVALBUTEROL HCL 1.25 MG/0.5ML IN NEBU
1.2500 mg | INHALATION_SOLUTION | Freq: Two times a day (BID) | RESPIRATORY_TRACT | Status: DC
  Administered 2019-03-22 – 2019-03-25 (×6): 1.25 mg via RESPIRATORY_TRACT
  Filled 2019-03-21 (×7): qty 0.5

## 2019-03-21 MED ORDER — SODIUM CHLORIDE 0.9 % IV SOLN: INTRAVENOUS | Status: DC

## 2019-03-21 NOTE — Discharge Instructions (Signed)

## 2019-03-21 NOTE — Progress Notes (Signed)
Progress Note  Patient Name: [name] [name] Date of Encounter: 03/21/2019  Primary Cardiologist: Nicki Guadalajara, MD   Subjective   Had a little trouble breathing last evening minor crackles noted in lower lobes, heart rate currently 115 and what appears to be a slow atrial flutter with 2-1 conduction.  Inpatient Medications    Scheduled Meds: . apixaban  5 mg Oral BID  . atorvastatin  80 mg Oral QHS  . clopidogrel  75 mg Oral Daily  . insulin aspart  0-5 Units Subcutaneous QHS  . insulin aspart  0-9 Units Subcutaneous TID WC  . insulin detemir  35 Units Subcutaneous QHS  . isosorbide mononitrate  120 mg Oral Daily  . metoprolol tartrate  50 mg Oral QID  . pantoprazole  40 mg Oral Daily   Continuous Infusions:  PRN Meds: acetaminophen, albuterol, levalbuterol, nitroGLYCERIN, ondansetron (ZOFRAN) IV   Vital Signs    Vitals:   03/20/19 2126 03/21/19 0022 03/21/19 0512 03/21/19 0850  BP: 110/63 116/74 122/78 109/75  Pulse: (!) 110 (!) 104 (!) 113 (!) 116  Resp:  19 18   Temp:   (!) 97.5 F (36.4 C)   TempSrc:   Oral   SpO2:  100% 100%   Weight:   74.6 kg   Height:        Intake/Output Summary (Last 24 hours) at 03/21/2019 1111 Last data filed at 03/20/2019 2130 Gross per 24 hour  Intake 360 ml  Output --  Net 360 ml   Last 3 Weights 03/21/2019 03/20/2019 03/15/2019  Weight (lbs) 164 lb 7.4 oz 164 lb 3.9 oz 164 lb 10.9 oz  Weight (kg) 74.6 kg 74.5 kg 74.7 kg      Telemetry    As described above- Personally Reviewed  ECG    Atrial fibrillation/flutter with variable conduction- Personally Reviewed  Physical Exam   GEN: No acute distress.   Neck: No JVD Cardiac:  Regular tachycardia, 2/6 systolic murmur, no rubs, or gallops.  Respiratory: Clear to auscultation bilaterally. GI: Soft, nontender, non-distended  MS: No edema; No deformity. Neuro:  Nonfocal  Psych: Normal affect   Labs    High Sensitivity Troponin:   Recent Labs  Lab 03/11/19 1823  03/11/19 2030 03/12/19 0344 03/20/19 1157 03/20/19 1434  TROPONINIHS 123* 110* 115* 63* 1,069*      Chemistry Recent Labs  Lab 03/15/19 0402 03/20/19 1157 03/21/19 0319  NA 139 137 136  K 4.5 5.8* 5.3*  CL 105 104 104  CO2 23 17* 21*  GLUCOSE 133* 229* 118*  BUN 43* 50* 55*  CREATININE 1.45* 1.77* 1.90*  CALCIUM 9.2 9.3 9.2  PROT  --  7.3  --   ALBUMIN  --  3.5  --   AST  --  20  --   ALT  --  16  --   ALKPHOS  --  101  --   BILITOT  --  0.7  --   GFRNONAA 37* 29* 27*  GFRAA 43* 34* 31*  ANIONGAP 11 16* 11     Hematology Recent Labs  Lab 03/20/19 1157 03/21/19 0319  WBC 8.8 9.0  RBC 4.15 3.36*  HGB 11.3* 9.2*  HCT 35.9* 28.6*  MCV 86.5 85.1  MCH 27.2 27.4  MCHC 31.5 32.2  RDW 15.0 15.2  PLT 313 320    BNP Recent Labs  Lab 03/20/19 1157  BNP 2,161.2*     DDimer No results for input(s): DDIMER in the last 168 hours.  Radiology    DG CHEST PORT 1 VIEW  Result Date: 03/20/2019 CLINICAL DATA:  Difficulty breathing. Lower lobe crackles. EXAM: PORTABLE CHEST 1 VIEW COMPARISON:  Radiograph earlier this day CT 02/14/2018 FINDINGS: Persistent but improved interstitial prominence from prior exam. Stable cardiomegaly with aortic atherosclerosis. No large pleural effusion. No focal airspace disease. No pneumothorax. Stable osseous structures. IMPRESSION: Interstitial prominence is diminished from prior exam likely resolving edema. Stable cardiomegaly. No new abnormality from earlier this day. Aortic Atherosclerosis (ICD10-I70.0). Electronically Signed   By: Narda Rutherford M.D.   On: 03/20/2019 22:57   DG Chest Port 1 View  Result Date: 03/20/2019 CLINICAL DATA:  Chest pain and shortness of breath beginning this morning. EXAM: PORTABLE CHEST 1 VIEW COMPARISON:  03/11/2019 and 02/19/2018 FINDINGS: Lungs are adequately inflated without lobar consolidation or effusion. Mild stable diffuse interstitial prominence. Mild stable cardiomegaly. Remainder of the exam is  unchanged. IMPRESSION: No acute findings. Mild stable cardiomegaly and bilateral interstitial prominence. Electronically Signed   By: Elberta Fortis M.D.   On: 03/20/2019 12:18    Cardiac Studies   03/12/2019: Echocardiogram   1. Diffuse hypokinesis worse in the septum and inferior base. Left  ventricular ejection fraction, by estimation, is 25 to 30%. The left  ventricle has moderate to severely decreased function. The left ventricle  demonstrates global hypokinesis. The left  ventricular internal cavity size was moderately dilated. Left ventricular  diastolic parameters are consistent with Grade III diastolic dysfunction  (restrictive). Elevated left ventricular end-diastolic pressure.  2. Right ventricular systolic function is normal. The right ventricular  size is normal. There is moderately elevated pulmonary artery systolic  pressure.  3. Left atrial size was moderately dilated.  4. Right atrial size was mildly dilated.  5. The mitral valve is normal in structure and function. Mild mitral  valve regurgitation. No evidence of mitral stenosis.  6. The aortic valve is normal in structure and function. Aortic valve  regurgitation is not visualized. No aortic stenosis is present.   Patient Profile     69 y.o. female with severe underlying coronary artery disease status post CABG and prior PCI's not amenable to surgical or further interventions here with atrial fibrillation/flutter with rapid ventricular response and angina.  Assessment & Plan    Persistent atrial fibrillation/flutter -EKG originally 125 bpm which appears to be coarse atrial fibrillation underlying.  Currently on telemetry looks like underlying slow atrial flutter type pattern with variable conduction at times.  Currently quite persistent at 115 bpm. -Currently using metoprolol 50 every 6. -Has received 10 mg of Eliquis yesterday afternoon, and is currently on 5 mg twice daily -I think it would be helpful for  [place] to proceed with TEE cardioversion tomorrow given her atrial arrhythmia, underlying cardiomyopathy to help protect her from worsening heart failure. Risks and benefits of been explained including rare esophageal damage, risk for pacemaker.  1 in 10,000.  She is willing to proceed.  I have written orders.  Acute kidney injury -Creatinine is trending upward.  Holding diuretic on admission.  Previously creatinine was 2.0 in April 2020.  Chronic systolic heart failure -BNP remains elevated at 2100.  She did have some mild crackles in lungs.  I like to try to restore normal rhythm to help her with this. -If she has another episode of shortness of breath especially at night, IV Lasix 40 mg would be reasonable.  I know we are worried about her kidney function.  Elevated troponin/demand ischemia -Increased from 63 up to  1000, high-sensitivity troponin.  This is compatible with demand ischemia in the setting of her underlying coronary artery disease that is not amenable to cardiac catheterization/surgical revascularization.  Continuing with aggressive medical management.  This does portend a worsened prognosis.  Peripheral vascular disease -Prior femoropopliteal bypass, September 2019 Doppler showed patent grafts.  She was on both aspirin and Plavix previously but now that she is on Eliquis, we are dropping the aspirin and continuing with Plavix.  Watch for bleeding.  Diabetes with hypertension hyperlipidemia -Continue with high intensity statin with goal LDL less than 70.  History of prior stroke -Secondary risk factor prevention.      For questions or updates, please contact [place] Please consult [email or web address] for contact info under        Signed, Candee Furbish, MD  03/21/2019, 11:11 AM

## 2019-03-21 NOTE — Progress Notes (Addendum)
PROGRESS NOTE  [name] [name] IRJ:[id number] DOB: 12-13-1950 DOA: 03/20/2019 PCP: Wenda Low, MD  [name] [name] is a 69 y.o. female with medical history significant of HFrEF (EF 25%), CAD, PVD, HTN, DM2, HLD, GERD, arthritis who presents for chest pain.  She was recently discharged on 03/14/18 for a hospitalization for volume overload.  She was diuresed well and she reports that she was feeling well this week, just with generalized weakness.  She woke up this morning, however, with a non productive cough.  She went back to sleep.  When she awoke again, she noted sharp/pressure like substernal chest pain, 10/10 in intensity.  It radiated to her arm and jaw and she thought she was having a heart attack.  She further had some mild nausea and diaphoresis.  She denied palpitations.  She called EMS and she was found to be in Afib.  She was given 200cc of IVF on the way to the hospital.    Cardiology plans to  Do cardioversion in the morning    HPI/Recap of past 24 hours:  Remain in afib/rvr, no hypoxia , on room air at rest She reports chest pain went away  Assessment/Plan: Active Problems:   PAD (peripheral artery disease) (HCC)   Coronary artery disease involving native coronary artery of native heart without angina pectoris   Diabetes mellitus ([place])   Hyperlipidemia with target LDL less than 70   Chronic systolic CHF (congestive heart failure) (HCC)   CKD (chronic kidney disease) stage 3, GFR 30-59 ml/min   Elevated troponin   Chest pain   Atrial fibrillation with RVR ([place])  Afib/RVR -New diagnosis of A. Fib -Cardiology consulted, she is currently on metoprolol, Eliquis, plan for TEE cardioversion tomorrow, management per cardiology, appreciate cardiology input  Chronic systolic chf TK16%-01% Minimal crackles at lung bases, trace edema in her legs Rhythm control will help heart failure symptoms,  Lasix on hold on admission due to concern of over diuresing given elevated  creatinine   AKI on CKD (chronic kidney disease) stage 3, GFR 30-59 ml/min Elevated K -Lokelma given in the ED, k down from 5.8 to 5.2 this morning -check ua and renal US -renal dosing meds  Anemia of chronic disease Hemoglobin at baseline  Coronary artery disease Continue plavix, statin PAD (peripheral artery disease) (HCC) Continue plavix, statin  Insulin-dependent type 2 diabetes -A1c 8.6 Hold home medication Metformin, continue Levemir, SSI   DVT Prophylaxis: Eliquis  Code Status: Full  Family Communication: patient   Disposition Plan:    Patient came from:             home                                                                                             Anticipated d/c place: TBD  Barriers to d/c OR conditions which need to be met to effect a safe d/c: remain in afib/rvr, needs cardioversion, needs cardiology clearance, discharge meds need to be sent to transitional pharmacy when she is ready to discharge   Consultants:  Cardiology  Procedures:  Plan for TEE cardioversion on Monday  Antibiotics:  None   Objective: BP 122/78 (BP Location: Left Arm)   Pulse (!) 113   Temp (!) 97.5 F (36.4 C) (Oral)   Resp 18   Ht 5' 4" (1.626 m)   Wt 74.6 kg   SpO2 100%   BMI 28.23 kg/m   Intake/Output Summary (Last 24 hours) at 03/21/2019 6803 Last data filed at 03/20/2019 2130 Gross per 24 hour  Intake 360 ml  Output --  Net 360 ml   Filed Weights   03/20/19 1852 03/21/19 0512  Weight: 74.5 kg 74.6 kg    Exam: Patient is examined daily including today on 03/21/2019, exams remain the same as of yesterday except that has changed    General: Frail, NAD  Cardiovascular: IRRR  Respiratory: CTABL, very minimal crackles at the bases, no wheezing, no rhonchi  Abdomen: Soft/ND/NT, positive BS  Musculoskeletal: Trace edema  Neuro: alert, oriented   Data Reviewed: Basic Metabolic Panel: Recent Labs  Lab 03/15/19 0402 03/20/19 1157  03/21/19 0319  NA 139 137 136  K 4.5 5.8* 5.3*  CL 105 104 104  CO2 23 17* 21*  GLUCOSE 133* 229* 118*  BUN 43* 50* 55*  CREATININE 1.45* 1.77* 1.90*  CALCIUM 9.2 9.3 9.2   Liver Function Tests: Recent Labs  Lab 03/20/19 1157  AST 20  ALT 16  ALKPHOS 101  BILITOT 0.7  PROT 7.3  ALBUMIN 3.5   No results for input(s): LIPASE, AMYLASE in the last 168 hours. No results for input(s): AMMONIA in the last 168 hours. CBC: Recent Labs  Lab 03/20/19 1157 03/21/19 0319  WBC 8.8 9.0  NEUTROABS 7.2  --   HGB 11.3* 9.2*  HCT 35.9* 28.6*  MCV 86.5 85.1  PLT 313 320   Cardiac Enzymes:   No results for input(s): CKTOTAL, CKMB, CKMBINDEX, TROPONINI in the last 168 hours. BNP (last 3 results) Recent Labs    05/08/18 0720 03/11/19 1823 03/20/19 1157  BNP 624.8* 2,460.7* 2,161.2*    ProBNP (last 3 results) No results for input(s): PROBNP in the last 8760 hours.  CBG: Recent Labs  Lab 03/14/19 2351 03/15/19 0404 03/15/19 0744 03/20/19 1928 03/21/19 0732  GLUCAP 160* 127* 135* 135* 127*    Recent Results (from the past 240 hour(s))  SARS CORONAVIRUS 2 (TAT 6-24 HRS) Nasopharyngeal Nasopharyngeal Swab     Status: None   Collection Time: 03/11/19  9:32 PM   Specimen: Nasopharyngeal Swab  Result Value Ref Range Status   SARS Coronavirus 2 NEGATIVE NEGATIVE Final    Comment: (NOTE) SARS-CoV-2 target nucleic acids are NOT DETECTED. The SARS-CoV-2 RNA is generally detectable in upper and lower respiratory specimens during the acute phase of infection. Negative results do not preclude SARS-CoV-2 infection, do not rule out co-infections with other pathogens, and should not be used as the sole basis for treatment or other patient management decisions. Negative results must be combined with clinical observations, patient history, and epidemiological information. The expected result is Negative. Fact Sheet for Patients: SugarRoll.be Fact Sheet  for Healthcare Providers: https://www.woods-mathews.com/ This test is not yet approved or cleared by the [place] FDA and  has been authorized for detection and/or diagnosis of SARS-CoV-2 by FDA under an Emergency Use Authorization (EUA). This EUA will remain  in effect (meaning this test can be used) for the duration of the COVID-19 declaration under Section 56 4(b)(1) of the Act, 21 U.S.C. section 360bbb-3(b)(1), unless the authorization is terminated or revoked sooner. Performed at [facility], [address], [place], [place] [address]   SARS CORONAVIRUS 2 (TAT 6-24 HRS) Nasopharyngeal Nasopharyngeal Swab     Status: None   Collection Time: 03/20/19  1:15 PM   Specimen: Nasopharyngeal Swab  Result Value Ref Range Status   SARS Coronavirus 2 NEGATIVE NEGATIVE Final    Comment: (NOTE) SARS-CoV-2 target nucleic acids are NOT DETECTED. The SARS-CoV-2 RNA is generally detectable in upper and lower respiratory specimens during the acute phase of infection. Negative results do not preclude SARS-CoV-2 infection, do not rule out co-infections with other pathogens, and should not be used as the sole basis for treatment or other patient management decisions. Negative results must be combined with clinical observations, patient history, and epidemiological information. The expected result is Negative. Fact Sheet for Patients: SugarRoll.be Fact Sheet for Healthcare Providers: https://www.woods-mathews.com/ This test is not yet approved or cleared by the [place] FDA and  has been authorized for detection and/or diagnosis of SARS-CoV-2 by FDA under an Emergency Use Authorization (EUA). This EUA will remain  in effect (meaning this test can be used) for the duration of the COVID-19 declaration under Section 56 4(b)(1) of the Act, 21 U.S.C. section 360bbb-3(b)(1), unless the authorization is terminated or revoked  sooner. Performed at [facility], [place] [address] [place], [place] [address]      Studies: DG CHEST PORT 1 VIEW  Result Date: 03/20/2019 CLINICAL DATA:  Difficulty breathing. Lower lobe crackles. EXAM: PORTABLE CHEST 1 VIEW COMPARISON:  Radiograph earlier this day CT 02/14/2018 FINDINGS: Persistent but improved interstitial prominence from prior exam. Stable cardiomegaly with aortic atherosclerosis. No large pleural effusion. No focal airspace disease. No pneumothorax. Stable osseous structures. IMPRESSION: Interstitial prominence is diminished from prior exam likely resolving edema. Stable cardiomegaly. No new abnormality from earlier this day. Aortic Atherosclerosis (ICD10-I70.0). Electronically Signed   By: Keith Rake M.D.   On: 03/20/2019 22:57   DG Chest Port 1 View  Result Date: 03/20/2019 CLINICAL DATA:  Chest pain and shortness of breath beginning this morning. EXAM: PORTABLE CHEST 1 VIEW COMPARISON:  03/11/2019 and 02/19/2018 FINDINGS: Lungs are adequately inflated without lobar consolidation or effusion. Mild stable diffuse interstitial prominence. Mild stable cardiomegaly. Remainder of the exam is unchanged. IMPRESSION: No acute findings. Mild stable cardiomegaly and bilateral interstitial prominence. Electronically Signed   By: Marin Olp M.D.   On: 03/20/2019 12:18    Scheduled Meds: . apixaban  5 mg Oral BID  . atorvastatin  80 mg Oral QHS  . clopidogrel  75 mg Oral Daily  . insulin aspart  0-5 Units Subcutaneous QHS  . insulin aspart  0-9 Units Subcutaneous TID WC  . insulin detemir  35 Units Subcutaneous QHS  . isosorbide mononitrate  120 mg Oral Daily  . metoprolol tartrate  50 mg Oral QID  . pantoprazole  40 mg Oral Daily    Continuous Infusions:   Time spent: 55mns I have personally reviewed and interpreted on  03/21/2019 daily labs, tele strips, imagings as discussed above under date review session and assessment and plans.  I reviewed all  nursing notes, pharmacy notes, consultant notes,  vitals, pertinent old records  I have discussed plan of care as described above with RN , patient  on 03/21/2019   FFlorencia ReasonsMD, PhD, FACP  [facility]  Available via Epic secure chat 7am-7pm for nonurgent issues Please page for urgent issues, pager number available through [email or web address] .   03/21/2019, 8:07 AM  LOS: 1 day

## 2019-03-22 DIAGNOSIS — I455 Other specified heart block: Secondary | ICD-10-CM

## 2019-03-22 DIAGNOSIS — R42 Dizziness and giddiness: Secondary | ICD-10-CM

## 2019-03-22 LAB — CBC WITH DIFFERENTIAL/PLATELET
Abs Immature Granulocytes: 0.02 10*3/uL (ref 0.00–0.07)
Basophils Absolute: 0.1 10*3/uL (ref 0.0–0.1)
Basophils Relative: 1 %
Eosinophils Absolute: 0.5 10*3/uL (ref 0.0–0.5)
Eosinophils Relative: 6 %
HCT: 29.1 % — ABNORMAL LOW (ref 36.0–46.0)
Hemoglobin: 9.3 g/dL — ABNORMAL LOW (ref 12.0–15.0)
Immature Granulocytes: 0 %
Lymphocytes Relative: 25 %
Lymphs Abs: 2.2 10*3/uL (ref 0.7–4.0)
MCH: 27.1 pg (ref 26.0–34.0)
MCHC: 32 g/dL (ref 30.0–36.0)
MCV: 84.8 fL (ref 80.0–100.0)
Monocytes Absolute: 0.7 10*3/uL (ref 0.1–1.0)
Monocytes Relative: 8 %
Neutro Abs: 5.4 10*3/uL (ref 1.7–7.7)
Neutrophils Relative %: 60 %
Platelets: 311 10*3/uL (ref 150–400)
RBC: 3.43 MIL/uL — ABNORMAL LOW (ref 3.87–5.11)
RDW: 15.1 % (ref 11.5–15.5)
WBC: 8.9 10*3/uL (ref 4.0–10.5)
nRBC: 0 % (ref 0.0–0.2)

## 2019-03-22 LAB — GLUCOSE, CAPILLARY
Glucose-Capillary: 125 mg/dL — ABNORMAL HIGH (ref 70–99)
Glucose-Capillary: 184 mg/dL — ABNORMAL HIGH (ref 70–99)
Glucose-Capillary: 176 mg/dL — ABNORMAL HIGH (ref 70–99)
Glucose-Capillary: 111 mg/dL — ABNORMAL HIGH (ref 70–99)

## 2019-03-22 LAB — BASIC METABOLIC PANEL
Anion gap: 12 (ref 5–15)
BUN: 57 mg/dL — ABNORMAL HIGH (ref 8–23)
CO2: 22 mmol/L (ref 22–32)
Calcium: 9 mg/dL (ref 8.9–10.3)
Chloride: 102 mmol/L (ref 98–111)
Creatinine, Ser: 1.95 mg/dL — ABNORMAL HIGH (ref 0.44–1.00)
GFR calc Af Amer: 30 mL/min — ABNORMAL LOW (ref >60)
GFR calc non Af Amer: 26 mL/min — ABNORMAL LOW (ref >60)
Glucose, Bld: 123 mg/dL — ABNORMAL HIGH (ref 70–99)
Potassium: 5.1 mmol/L (ref 3.5–5.1)
Sodium: 136 mmol/L (ref 135–145)

## 2019-03-22 MED ORDER — FUROSEMIDE 10 MG/ML IJ SOLN
40.0000 mg | Freq: Once | INTRAMUSCULAR | Status: AC
  Administered 2019-03-22: 40 mg via INTRAVENOUS
  Filled 2019-03-22: qty 4

## 2019-03-22 MED ORDER — DOCUSATE SODIUM 100 MG PO CAPS
200.0000 mg | ORAL_CAPSULE | Freq: Every day | ORAL | Status: DC
  Administered 2019-03-22 – 2019-03-24 (×3): 200 mg via ORAL
  Filled 2019-03-22 (×4): qty 2

## 2019-03-22 MED ORDER — METOPROLOL TARTRATE 50 MG PO TABS
50.0000 mg | ORAL_TABLET | Freq: Two times a day (BID) | ORAL | Status: DC
  Administered 2019-03-22 – 2019-03-25 (×7): 50 mg via ORAL
  Filled 2019-03-22 (×7): qty 1

## 2019-03-22 NOTE — Care Management (Signed)
Per [employer]. W/[employer] @[phone] -  Co-pay amount for Eliquis 5 mg. twice a day.for 30 day supply $35.00.  No PA required No Deductible Tier 3  Retail pharmacy [facility].

## 2019-03-22 NOTE — Progress Notes (Addendum)
PROGRESS NOTE    [name] [name]  MHD:[id number]  DOB: 1950/08/12  PCP: Wenda Low, MD Admit date:03/20/2019  69 y.o. female with medical history significant of HFrEF (EF 25%), CAD, PVD, HTN, DM2, HLD, GERD, arthritis who was recently hospitalized for CHF exacerbation, diuresed well and discharged home on 2/15, now presented with c/o chest pain--described as substernal chest pain, 10/10 in intensity, radiating to her arm and jaw with mild nausea and diaphoresis. She denied palpitations.  She called EMS and she was found to be in Afib.  She was given 200cc of IVF on the way to the hospital and felt better. ED Course: Afebrile, she was initially started on a cardizem drip and heparin for new diagnosis of A fib.  Cardiology was consulted and recommended QID beta blocker, eliquis (hold aspirin, continue plavix) given her depressed EF.  She was also noted to have a mildly elevated K to 5.8 (likely related to diuresis), worsening renal function with a Cr of 1.77 and BUN of 50 (ratio of 28 which suggests pre-renal), elevated BNP and elevated troponin Hospital course: Patient admitted to [facility] for further evaluation with cardiology consultations.  Subjective:  Patient reports feeling nauseous and lightheaded this morning, feels better now after zofran. Converted spontaneously to NSR and DCCV deferred. Denies any chest pain. Feels constipated. Has [place] on, but no 02 flow , saturating 98% on RA  Objective: Vitals:   03/21/19 1819 03/21/19 1941 03/21/19 2255 03/22/19 0617  BP:    104/68  Pulse:    67  Resp:      Temp:      TempSrc:      SpO2: 100% 100% 100% 100%  Weight:    75.4 kg  Height:        Intake/Output Summary (Last 24 hours) at 03/22/2019 0824 Last data filed at 03/21/2019 1542 Gross per 24 hour  Intake --  Output 200 ml  Net -200 ml   Filed Weights   03/20/19 1852 03/21/19 0512 03/22/19 0617  Weight: 74.5 kg 74.6 kg 75.4 kg    Physical Examination:  General exam: Appears in mild  distress due to nausea but no resp distress. Respiratory system: Clear to auscultation. Respiratory effort normal. Cardiovascular system: S1 & S2 heard, RRR. No JVD, murmurs, rubs, gallops or clicks. No pedal edema. Gastrointestinal system: Abdomen is nondistended, soft and nontender. Normal bowel sounds heard. Central nervous system: Alert and oriented. No new focal neurological deficits. Extremities: No contractures, edema or joint deformities.  Skin: No rashes, lesions or ulcers Psychiatry: Judgement and insight appear normal. Mood & affect anxious.   Data Reviewed: I have personally reviewed following labs and imaging studies  CBC: Recent Labs  Lab 03/20/19 1157 03/21/19 0319 03/22/19 0222  WBC 8.8 9.0 8.9  NEUTROABS 7.2  --  5.4  HGB 11.3* 9.2* 9.3*  HCT 35.9* 28.6* 29.1*  MCV 86.5 85.1 84.8  PLT 313 320 211   Basic Metabolic Panel: Recent Labs  Lab 03/20/19 1157 03/21/19 0319 03/22/19 0222  NA 137 136 136  K 5.8* 5.3* 5.1  CL 104 104 102  CO2 17* 21* 22  GLUCOSE 229* 118* 123*  BUN 50* 55* 57*  CREATININE 1.77* 1.90* 1.95*  CALCIUM 9.3 9.2 9.0   GFR: Estimated Creatinine Clearance: 27.5 mL/min (A) (by C-G formula based on SCr of 1.95 mg/dL (H)). Liver Function Tests: Recent Labs  Lab 03/20/19 1157  AST 20  ALT 16  ALKPHOS 101  BILITOT 0.7  PROT 7.3  ALBUMIN 3.5   No results for input(s): LIPASE, AMYLASE in the last 168 hours. No results for input(s): AMMONIA in the last 168 hours. Coagulation Profile: Recent Labs  Lab 03/21/19 1947  INR 1.9*   Cardiac Enzymes: No results for input(s): CKTOTAL, CKMB, CKMBINDEX, TROPONINI in the last 168 hours. BNP (last 3 results) No results for input(s): PROBNP in the last 8760 hours. HbA1C: Recent Labs    03/20/19 1911  HGBA1C 6.6*   CBG: Recent Labs  Lab 03/21/19 0732 03/21/19 1123 03/21/19 1626 03/21/19 2112 03/22/19 0812  GLUCAP 127* 191* 205* 242* 125*   Lipid Profile: No results for  input(s): CHOL, HDL, LDLCALC, TRIG, CHOLHDL, LDLDIRECT in the last 72 hours. Thyroid Function Tests: Recent Labs    03/20/19 1911  TSH 0.771   Anemia Panel: No results for input(s): VITAMINB12, FOLATE, FERRITIN, TIBC, IRON, RETICCTPCT in the last 72 hours. Sepsis Labs: No results for input(s): PROCALCITON, LATICACIDVEN in the last 168 hours.  Recent Results (from the past 240 hour(s))  SARS CORONAVIRUS 2 (TAT 6-24 HRS) Nasopharyngeal Nasopharyngeal Swab     Status: None   Collection Time: 03/20/19  1:15 PM   Specimen: Nasopharyngeal Swab  Result Value Ref Range Status   SARS Coronavirus 2 NEGATIVE NEGATIVE Final    Comment: (NOTE) SARS-CoV-2 target nucleic acids are NOT DETECTED. The SARS-CoV-2 RNA is generally detectable in upper and lower respiratory specimens during the acute phase of infection. Negative results do not preclude SARS-CoV-2 infection, do not rule out co-infections with other pathogens, and should not be used as the sole basis for treatment or other patient management decisions. Negative results must be combined with clinical observations, patient history, and epidemiological information. The expected result is Negative. Fact Sheet for Patients: HairSlick.no Fact Sheet for Healthcare Providers: quierodirigir.com This test is not yet approved or cleared by the [place] FDA and  has been authorized for detection and/or diagnosis of SARS-CoV-2 by FDA under an Emergency Use Authorization (EUA). This EUA will remain  in effect (meaning this test can be used) for the duration of the COVID-19 declaration under Section 56 4(b)(1) of the Act, 21 U.S.C. section 360bbb-3(b)(1), unless the authorization is terminated or revoked sooner. Performed at [facility], [address], [place], [place] [address]       Radiology Studies: US RENAL  Result Date: 03/21/2019 CLINICAL DATA:  Elevated creatinine  EXAM: RENAL / URINARY TRACT ULTRASOUND COMPLETE COMPARISON:  Abdominal ultrasound dated 08/04/2008 FINDINGS: Right Kidney: Renal measurements: 10.1 x 4.2 x 5.0 cm = volume: 110 mL. Echogenic renal parenchyma. No mass or hydronephrosis visualized. Left Kidney: Renal measurements: 10.1 x 5.3 x 5.9 cm = volume: 166 mL. Echogenic renal parenchyma. No mass or hydronephrosis visualized. Bladder: Underdistended but unremarkable. Other: None. IMPRESSION: Echogenic renal parenchyma, suggesting medical renal disease. No hydronephrosis. Electronically Signed   By: Charline Bills M.D.   On: 03/21/2019 15:12   DG CHEST PORT 1 VIEW  Result Date: 03/20/2019 CLINICAL DATA:  Difficulty breathing. Lower lobe crackles. EXAM: PORTABLE CHEST 1 VIEW COMPARISON:  Radiograph earlier this day CT 02/14/2018 FINDINGS: Persistent but improved interstitial prominence from prior exam. Stable cardiomegaly with aortic atherosclerosis. No large pleural effusion. No focal airspace disease. No pneumothorax. Stable osseous structures. IMPRESSION: Interstitial prominence is diminished from prior exam likely resolving edema. Stable cardiomegaly. No new abnormality from earlier this day. Aortic Atherosclerosis (ICD10-I70.0). Electronically Signed   By: Narda Rutherford M.D.   On: 03/20/2019 22:57   DG Chest  Port 1 View  Result Date: 03/20/2019 CLINICAL DATA:  Chest pain and shortness of breath beginning this morning. EXAM: PORTABLE CHEST 1 VIEW COMPARISON:  03/11/2019 and 02/19/2018 FINDINGS: Lungs are adequately inflated without lobar consolidation or effusion. Mild stable diffuse interstitial prominence. Mild stable cardiomegaly. Remainder of the exam is unchanged. IMPRESSION: No acute findings. Mild stable cardiomegaly and bilateral interstitial prominence. Electronically Signed   By: Elberta Fortis M.D.   On: 03/20/2019 12:18        Scheduled Meds: . apixaban  5 mg Oral BID  . atorvastatin  80 mg Oral QHS  . clopidogrel  75 mg  Oral Daily  . insulin aspart  0-5 Units Subcutaneous QHS  . insulin aspart  0-9 Units Subcutaneous TID WC  . insulin detemir  35 Units Subcutaneous QHS  . isosorbide mononitrate  120 mg Oral Daily  . levalbuterol  1.25 mg Nebulization BID  . metoprolol tartrate  50 mg Oral BID  . pantoprazole  40 mg Oral Daily   Continuous Infusions: . sodium chloride 20 mL/hr at 03/21/19 2033   Assessment/Plan:   New onset A. Fib with RVR-Cardiology following, she is currently off cardizem drip, on metoprolol for rate control , Eliquis for anticoagulation as CHA2DS2-VASc = 8 (CHF, HTN, age >63, DM, stroke x2, CAD/PAD, female). She was planned for TEE cardioversion today by cardiology but converted spontaneously.Post conversion pause of 1.8 seconds noted on telemetry. Also had another pause of 2.8 seconds. Lopressor dose reduced by cardiology. No sure if her symtoms of lightheadedness related to pauses/vertigo (denies spinning sensation). Check TSH  Chronic systolic CHF-recent hospitalization for decompensation-Patient has low EF of 25%-30%- leg edema trace at this time, lungs clear and saturating well on RA. Rhythm control will help heart failure symptoms. Lasix held on admission due to concern of over diuresing given elevated creatinine  AKI onCKD (chronic kidney disease) stage 3, GFR 30-59 ml/min : likely secondary to cardiorenal syndrome in the setting of recent aggressive diuresis.  Patient been on 15ml./hr IVF since yesterday for procedure and diuretics been on hold since admission. Will increase IV fluid rate to 3ml/hr given uptrending creat and hyperkalemia. Renal USG--Echogenic renal parenchyma, suggesting medical renal disease.No hydronephrosis. Labs in am.   Hyperkalemia: Lokelma given in the ED, potassium down from 5.8 to 5.1 this morning. Not on ACEI.   Insulin-dependent type 2 diabetes-A1c 8.6.Hold home medication Metformin, continue Levemir, SSI  Coronary artery disease-Continue plavix,  statin. Now off ASA.   PAD (peripheral artery disease) - Continue plavix, statin. Also on anticoagulation now  Anemia of chronic disease-Hemoglobin at baseline , tolerating anticoagulation well   DVT prophylaxis: Eliquis Code Status: Full code Family / Patient Communication:  Disposition Plan:   Patient is from home prior to hospitalization. Received/Receiving inpatient care for new onset A,fib , cardiorenal syndrome/hyperkalemia/sinus pauses with dizziness Discharge when renal function stabilizes, off IVF, diuretics adjusted and cleared by cardiology.        LOS: 2 days    Time spent: 35 min    Alessandra Bevels, MD [facility] Pager in [place]  If 7PM-7AM, please contact night-coverage [email or web address] 03/22/2019, 8:24 AM

## 2019-03-22 NOTE — Progress Notes (Addendum)
Progress Note  Patient Name: [name] [name] Date of Encounter: 03/22/2019  Primary Cardiologist: Nicki Guadalajara, MD   Subjective   No acute overnight events. Patient states she is not feeling well this morning. She notes feeling very dizzy. Dizziness worse when moving her head to one side or the other. She does have a history of vertigo but states that was a long time ago. She also notes some nausea earlier this morning that improved with Zofran. She notes some mild abdominal pain at this time. No chest pain. Breathing is about the same as yesterday.  Inpatient Medications    Scheduled Meds: . apixaban  5 mg Oral BID  . atorvastatin  80 mg Oral QHS  . clopidogrel  75 mg Oral Daily  . insulin aspart  0-5 Units Subcutaneous QHS  . insulin aspart  0-9 Units Subcutaneous TID WC  . insulin detemir  35 Units Subcutaneous QHS  . isosorbide mononitrate  120 mg Oral Daily  . levalbuterol  1.25 mg Nebulization BID  . metoprolol tartrate  50 mg Oral QID  . pantoprazole  40 mg Oral Daily   Continuous Infusions: . sodium chloride 20 mL/hr at 03/21/19 2033   PRN Meds: acetaminophen, albuterol, nitroGLYCERIN, ondansetron (ZOFRAN) IV   Vital Signs    Vitals:   03/21/19 1819 03/21/19 1941 03/21/19 2255 03/22/19 0617  BP:    104/68  Pulse:    67  Resp:      Temp:      TempSrc:      SpO2: 100% 100% 100% 100%  Weight:      Height:        Intake/Output Summary (Last 24 hours) at 03/22/2019 0722 Last data filed at 03/21/2019 1542 Gross per 24 hour  Intake --  Output 200 ml  Net -200 ml   Last 3 Weights 03/21/2019 03/20/2019 03/15/2019  Weight (lbs) 164 lb 7.4 oz 164 lb 3.9 oz 164 lb 10.9 oz  Weight (kg) 74.6 kg 74.5 kg 74.7 kg      Telemetry    Converted to normal sinus rhythm with rate in the 60's to 70's around 5:20 this morning. Post conversion pause of 1.8 sec noted. Also noted to have 2.8 sec pause.  - Personally Reviewed  ECG    No new ECG tracing today. - Personally  Reviewed  Physical Exam   GEN: No acute distress.   Neck: Supple. No JVD Cardiac: RRR. Possible soft systolic murmur noted. No rubs or gallops.  Respiratory: Clear to auscultation bilaterally. Possible mild crackles noted in bases but no significant rhonchi or wheezes.  GI: Soft, non-distended, and non-tender. Bowel sounds present. MS: Mild lower extremity edema. Anterior aspect of lower extremities very tender to palpation. Skin: Warm and dry. Neuro:  No focal deficits. Psych: Normal affect. Responds appropriately.  Labs    High Sensitivity Troponin:   Recent Labs  Lab 03/11/19 1823 03/11/19 2030 03/12/19 0344 03/20/19 1157 03/20/19 1434  TROPONINIHS 123* 110* 115* 63* 1,069*      Chemistry Recent Labs  Lab 03/20/19 1157 03/21/19 0319 03/22/19 0222  NA 137 136 136  K 5.8* 5.3* 5.1  CL 104 104 102  CO2 17* 21* 22  GLUCOSE 229* 118* 123*  BUN 50* 55* 57*  CREATININE 1.77* 1.90* 1.95*  CALCIUM 9.3 9.2 9.0  PROT 7.3  --   --   ALBUMIN 3.5  --   --   AST 20  --   --   ALT 16  --   --  ALKPHOS 101  --   --   BILITOT 0.7  --   --   GFRNONAA 29* 27* 26*  GFRAA 34* 31* 30*  ANIONGAP 16* 11 12     Hematology Recent Labs  Lab 03/20/19 1157 03/21/19 0319 03/22/19 0222  WBC 8.8 9.0 8.9  RBC 4.15 3.36* 3.43*  HGB 11.3* 9.2* 9.3*  HCT 35.9* 28.6* 29.1*  MCV 86.5 85.1 84.8  MCH 27.2 27.4 27.1  MCHC 31.5 32.2 32.0  RDW 15.0 15.2 15.1  PLT 313 320 311    BNP Recent Labs  Lab 03/20/19 1157  BNP 2,161.2*     DDimer No results for input(s): DDIMER in the last 168 hours.   Radiology    US RENAL  Result Date: 03/21/2019 CLINICAL DATA:  Elevated creatinine EXAM: RENAL / URINARY TRACT ULTRASOUND COMPLETE COMPARISON:  Abdominal ultrasound dated 08/04/2008 FINDINGS: Right Kidney: Renal measurements: 10.1 x 4.2 x 5.0 cm = volume: 110 mL. Echogenic renal parenchyma. No mass or hydronephrosis visualized. Left Kidney: Renal measurements: 10.1 x 5.3 x 5.9 cm =  volume: 166 mL. Echogenic renal parenchyma. No mass or hydronephrosis visualized. Bladder: Underdistended but unremarkable. Other: None. IMPRESSION: Echogenic renal parenchyma, suggesting medical renal disease. No hydronephrosis. Electronically Signed   By: Julian Hy M.D.   On: 03/21/2019 15:12   DG CHEST PORT 1 VIEW  Result Date: 03/20/2019 CLINICAL DATA:  Difficulty breathing. Lower lobe crackles. EXAM: PORTABLE CHEST 1 VIEW COMPARISON:  Radiograph earlier this day CT 02/14/2018 FINDINGS: Persistent but improved interstitial prominence from prior exam. Stable cardiomegaly with aortic atherosclerosis. No large pleural effusion. No focal airspace disease. No pneumothorax. Stable osseous structures. IMPRESSION: Interstitial prominence is diminished from prior exam likely resolving edema. Stable cardiomegaly. No new abnormality from earlier this day. Aortic Atherosclerosis (ICD10-I70.0). Electronically Signed   By: Keith Rake M.D.   On: 03/20/2019 22:57   DG Chest Port 1 View  Result Date: 03/20/2019 CLINICAL DATA:  Chest pain and shortness of breath beginning this morning. EXAM: PORTABLE CHEST 1 VIEW COMPARISON:  03/11/2019 and 02/19/2018 FINDINGS: Lungs are adequately inflated without lobar consolidation or effusion. Mild stable diffuse interstitial prominence. Mild stable cardiomegaly. Remainder of the exam is unchanged. IMPRESSION: No acute findings. Mild stable cardiomegaly and bilateral interstitial prominence. Electronically Signed   By: Marin Olp M.D.   On: 03/20/2019 12:18    Cardiac Studies   Echocardiogram 03/12/2019: Impressions: 1. Diffuse hypokinesis worse in the septum and inferior base. Left  ventricular ejection fraction, by estimation, is 25 to 30%. The left  ventricle has moderate to severely decreased function. The left ventricle  demonstrates global hypokinesis. The left  ventricular internal cavity size was moderately dilated. Left ventricular  diastolic  parameters are consistent with Grade III diastolic dysfunction  (restrictive). Elevated left ventricular end-diastolic pressure.  2. Right ventricular systolic function is normal. The right ventricular  size is normal. There is moderately elevated pulmonary artery systolic  pressure.  3. Left atrial size was moderately dilated.  4. Right atrial size was mildly dilated.  5. The mitral valve is normal in structure and function. Mild mitral  valve regurgitation. No evidence of mitral stenosis.  6. The aortic valve is normal in structure and function. Aortic valve  regurgitation is not visualized. No aortic stenosis is present.  Patient Profile   [name] [name] is a 69 y.o. female with a history of CAD not amenable to further PCI or CABG, chronic combined CHF of 25-30% on recent Echo, PVD, stroke,  hypertension, hyperlipidemia, type 2 diabetes mellitus who is being seen for evaluation of atrial fibrillation with RVR.  Assessment & Plan    Paroxysmal Atrial Fibrillation/Flutter - Initially EKG appeared consistent with coarse atrial fibrillation with rate of 125 bpm. Telemetry yesterday looked consistent with slow atrial flutter type pattern with variable conduction.  - Looks like patient converted back to sinus rhythm around 5:20am. Post conversion pause of 1.8 seconds noted on telemetry. Also had another pause of 2.8 seconds.  - Recent Echo from 03/12/2019 showed LVEF of 25-30%.  - Potassium 5.1.  - TSH normal.  - Will decrease Lopressor to 50mg  twice daily. If she tolerate this well, can consider consolidating to Toprol prior to discharge given reduced EF. Continue to monitor on telemetry for additional pauses. - CHA2DS2-VASc = 8 (CHF, HTN, age >70, DM, stroke x2, CAD/PAD, female). She was loaded with Eliquis 10mg  on 03/20/2019 and now on 5mg  twice daily.  - Can cancel plans for TEE/DCCV now that patient is back in sinus rhythm.  Demand Ischemia - High-sensitivity troponin elevated at 63 >>  1,069.  - Felt to be consistent with demand ischemia in setting of atrial fibrillation with RVR with underlying CAD that is not amenable to PCI or surgical revascularization. No further ischemic evaluation planned at this time.  CAD  - Patient has known CAD including 80% stenosis of the distal left main. Unfortunately, disease is not felt to be amenable to PCI or CABG. - No angina. - Continue aggressive medical therapy. Aspirin discontinued due to need for Eliquis. However, will continue Plavix. Continue beta-blocker, Imdur, and high-intensity statin.   Chronic Combined CHF - BNP elevated at 2100. - Most recent Echo from 03/12/2019 showed LVEF of 25-30% with diffuse hypokinesis (worse in the septum and inferior base) and grade 3 diastolic dysfunction. - She notes some shortness of breath. Possible mild crackles in bases but nothing significant. - Will discuss dose of IV Lasix with MD given renal function. - No ACE/ARB/ARNI or Spironolactone at this time due to renal function.  - Continue beta-blocker. - Continue to monitor daily weights, strict I/O's, and renal function.   PAD - History of femoropopliteal bypass. Dopplers in 09/2017 showed patent grafts. - Continue Plavix and high-intensity statin. Aspirin stopped due to need for Eliquis.  Hypertension - BP currently well controlled and actually on the softer side. - Continue Lopressor as above.   Hyperlipidemia - Continue Lipitor 80mg  daily.  AKI - Creatinine 1.77 on admission and 1.95 today. Recent baseline appears to be around 1.1 to 1.5.  - Avoid Nephrotoxic agents.  - Continue daily BMET.   Anemia - Hemoglobin 9.3 today. Baseline around 8 to 10 range.  - Continue to monitor for signs of bleeding with addition of Eliquis.  Dizziness - Patient's main complaint this morning is dizziness that is worse when she turns her head. Sounds more like vertigo which she states she does have a history of.  - Telemetry did show a  post-conversion pause as well as another 2.8 second pause this morning but currently in sinus rhythm with rates in the 60's to 70's. - Continue to monitor on telemetry for any additional pauses/arrhythmias.   For questions or updates, please contact [facility] Please consult [email or web address] for contact info under        Signed, , PA-C  03/22/2019, 7:22 AM    Personally seen and examined. Agree with above.   She converted to sinus rhythm this morning at around 5 AM  with a short postconversion pause.  Still feeling some dizziness vertigo-like.  Her additional metoprolol is noted, we will try to consolidate dose later but today lets decrease to 50 tartrate twice a day. With her nausea, mild crackles on lungs, we will give her 40 mg of IV Lasix today.  Concerned about accumulation of fluid with her ejection fraction of 20 to 25%.  Hopefully the conversion back to sinus rhythm will also help with auto diuresis.  Of course, continue to monitor creatinine which does continue to increase.  Donato Schultz, MD

## 2019-03-22 NOTE — Care Management (Signed)
03-22-19 1009 Benefits Check submitted for Eliquis. Case Manager will make patient aware of cost. Graves-Bigelow, Lamar Laundry, RN,BSN Case Manager

## 2019-03-23 ENCOUNTER — Other Ambulatory Visit: Payer: Self-pay

## 2019-03-23 ENCOUNTER — Inpatient Hospital Stay (HOSPITAL_COMMUNITY): Payer: Medicare Other

## 2019-03-23 LAB — BASIC METABOLIC PANEL
Anion gap: 14 (ref 5–15)
BUN: 66 mg/dL — ABNORMAL HIGH (ref 8–23)
CO2: 19 mmol/L — ABNORMAL LOW (ref 22–32)
Calcium: 8.9 mg/dL (ref 8.9–10.3)
Chloride: 105 mmol/L (ref 98–111)
Creatinine, Ser: 2.31 mg/dL — ABNORMAL HIGH (ref 0.44–1.00)
GFR calc Af Amer: 24 mL/min — ABNORMAL LOW (ref >60)
GFR calc non Af Amer: 21 mL/min — ABNORMAL LOW (ref >60)
Glucose, Bld: 105 mg/dL — ABNORMAL HIGH (ref 70–99)
Potassium: 4.8 mmol/L (ref 3.5–5.1)
Sodium: 138 mmol/L (ref 135–145)

## 2019-03-23 LAB — GLUCOSE, CAPILLARY
Glucose-Capillary: 89 mg/dL (ref 70–99)
Glucose-Capillary: 137 mg/dL — ABNORMAL HIGH (ref 70–99)
Glucose-Capillary: 132 mg/dL — ABNORMAL HIGH (ref 70–99)
Glucose-Capillary: 264 mg/dL — ABNORMAL HIGH (ref 70–99)

## 2019-03-23 LAB — BRAIN NATRIURETIC PEPTIDE: B Natriuretic Peptide: 2115.8 pg/mL — ABNORMAL HIGH (ref 0.0–100.0)

## 2019-03-23 LAB — TSH: TSH: 1.737 u[IU]/mL (ref 0.350–4.500)

## 2019-03-23 MED ORDER — FUROSEMIDE 10 MG/ML IJ SOLN: 80.0000 mg | Freq: Once | INTRAMUSCULAR | Status: DC

## 2019-03-23 NOTE — Progress Notes (Signed)
PROGRESS NOTE    [name] [name]  GUR:[id number]  DOB: 03/15/1950  PCP: Georgann Housekeeper, MD Admit date:03/20/2019  69 y.o. female with medical history significant of HFrEF (EF 25%), CAD, PVD, HTN, DM2, HLD, GERD, arthritis who was recently hospitalized for CHF exacerbation, diuresed well and discharged home on 2/15, now presented with c/o chest pain--described as substernal chest pain, 10/10 in intensity, radiating to her arm and jaw with mild nausea and diaphoresis. She denied palpitations.  She called EMS and she was found to be in Afib.  She was given 200cc of IVF on the way to the hospital and felt better. ED Course: Afebrile, she was initially started on a cardizem drip and heparin for new diagnosis of A fib.  Cardiology was consulted and recommended QID beta blocker, eliquis (hold aspirin, continue plavix) given her depressed EF.  She was also noted to have a mildly elevated K to 5.8 (likely related to diuresis), worsening renal function with a Cr of 1.77 and BUN of 50 (ratio of 28 which suggests pre-renal), elevated BNP and elevated troponin Hospital course: Patient admitted to [facility] for further evaluation with cardiology consultations.  Subjective:  Patient reports feeling orthopneic overnight-describes feeling tight in her throat and short of breath.  Had recurrence of nausea again this morning- feels better now after zofran.  She thinks symptoms might be related to multiple meds.  Converted spontaneously to NSR on 2/22 and DCCV deferred.  Remains in sinus rhythm today.  Denies any chest pain-saturating 98% on RA  Objective: Vitals:   03/22/19 1949 03/22/19 2136 03/23/19 0438 03/23/19 1000  BP: (!) 123/58  135/74 119/70  Pulse: 72  62 64  Resp:    19  Temp: 97.8 F (36.6 C)  (!) 97.5 F (36.4 C)   TempSrc: Oral  Oral   SpO2: 99% 94% 100% 95%  Weight:   75.9 kg   Height:        Intake/Output Summary (Last 24 hours) at 03/23/2019 1347 Last data filed at 03/23/2019 1245 Gross per 24  hour  Intake 588.93 ml  Output 350 ml  Net 238.93 ml   Filed Weights   03/21/19 0512 03/22/19 0617 03/23/19 0438  Weight: 74.6 kg 75.4 kg 75.9 kg    Physical Examination:  General exam: Appears in mild distress due to nausea but no resp distress. Respiratory system: Clear to auscultation, except for mild right basal crackles. Respiratory effort normal.  Saturating well on room air Cardiovascular system: S1 & S2 heard, RRR. No JVD, murmurs,  No pedal edema. Gastrointestinal system: Abdomen is nondistended, soft and nontender. Normal bowel sounds heard. Central nervous system: Alert and oriented. No new focal neurological deficits. Extremities: No contractures, edema or joint deformities.  Skin: No rashes, lesions or ulcers Psychiatry: Judgement and insight appear normal. Mood & affect anxious.   Data Reviewed: I have personally reviewed following labs and imaging studies  CBC: Recent Labs  Lab 03/20/19 1157 03/21/19 0319 03/22/19 0222  WBC 8.8 9.0 8.9  NEUTROABS 7.2  --  5.4  HGB 11.3* 9.2* 9.3*  HCT 35.9* 28.6* 29.1*  MCV 86.5 85.1 84.8  PLT 313 320 311   Basic Metabolic Panel: Recent Labs  Lab 03/20/19 1157 03/21/19 0319 03/22/19 0222 03/23/19 0246  NA 137 136 136 138  K 5.8* 5.3* 5.1 4.8  CL 104 104 102 105  CO2 17* 21* 22 19*  GLUCOSE 229* 118* 123* 105*  BUN 50* 55* 57* 66*  CREATININE 1.77* 1.90* 1.95*  2.31*  CALCIUM 9.3 9.2 9.0 8.9   GFR: Estimated Creatinine Clearance: 23.3 mL/min (A) (by C-G formula based on SCr of 2.31 mg/dL (H)). Liver Function Tests: Recent Labs  Lab 03/20/19 1157  AST 20  ALT 16  ALKPHOS 101  BILITOT 0.7  PROT 7.3  ALBUMIN 3.5   No results for input(s): LIPASE, AMYLASE in the last 168 hours. No results for input(s): AMMONIA in the last 168 hours. Coagulation Profile: Recent Labs  Lab 03/21/19 1947  INR 1.9*   Cardiac Enzymes: No results for input(s): CKTOTAL, CKMB, CKMBINDEX, TROPONINI in the last 168 hours. BNP  (last 3 results) No results for input(s): PROBNP in the last 8760 hours. HbA1C: Recent Labs    03/20/19 1911  HGBA1C 6.6*   CBG: Recent Labs  Lab 03/22/19 1125 03/22/19 1630 03/22/19 2159 03/23/19 0747 03/23/19 1154  GLUCAP 184* 176* 111* 89 137*   Lipid Profile: No results for input(s): CHOL, HDL, LDLCALC, TRIG, CHOLHDL, LDLDIRECT in the last 72 hours. Thyroid Function Tests: Recent Labs    03/23/19 0246  TSH 1.737   Anemia Panel: No results for input(s): VITAMINB12, FOLATE, FERRITIN, TIBC, IRON, RETICCTPCT in the last 72 hours. Sepsis Labs: No results for input(s): PROCALCITON, LATICACIDVEN in the last 168 hours.  Recent Results (from the past 240 hour(s))  SARS CORONAVIRUS 2 (TAT 6-24 HRS) Nasopharyngeal Nasopharyngeal Swab     Status: None   Collection Time: 03/20/19  1:15 PM   Specimen: Nasopharyngeal Swab  Result Value Ref Range Status   SARS Coronavirus 2 NEGATIVE NEGATIVE Final    Comment: (NOTE) SARS-CoV-2 target nucleic acids are NOT DETECTED. The SARS-CoV-2 RNA is generally detectable in upper and lower respiratory specimens during the acute phase of infection. Negative results do not preclude SARS-CoV-2 infection, do not rule out co-infections with other pathogens, and should not be used as the sole basis for treatment or other patient management decisions. Negative results must be combined with clinical observations, patient history, and epidemiological information. The expected result is Negative. Fact Sheet for Patients: HairSlick.no Fact Sheet for Healthcare Providers: quierodirigir.com This test is not yet approved or cleared by the [place] FDA and  has been authorized for detection and/or diagnosis of SARS-CoV-2 by FDA under an Emergency Use Authorization (EUA). This EUA will remain  in effect (meaning this test can be used) for the duration of the COVID-19 declaration under Section 56  4(b)(1) of the Act, 21 U.S.C. section 360bbb-3(b)(1), unless the authorization is terminated or revoked sooner. Performed at [facility], [address], [place], [place] [address]       Radiology Studies: US RENAL  Result Date: 03/21/2019 CLINICAL DATA:  Elevated creatinine EXAM: RENAL / URINARY TRACT ULTRASOUND COMPLETE COMPARISON:  Abdominal ultrasound dated 08/04/2008 FINDINGS: Right Kidney: Renal measurements: 10.1 x 4.2 x 5.0 cm = volume: 110 mL. Echogenic renal parenchyma. No mass or hydronephrosis visualized. Left Kidney: Renal measurements: 10.1 x 5.3 x 5.9 cm = volume: 166 mL. Echogenic renal parenchyma. No mass or hydronephrosis visualized. Bladder: Underdistended but unremarkable. Other: None. IMPRESSION: Echogenic renal parenchyma, suggesting medical renal disease. No hydronephrosis. Electronically Signed   By: Charline Bills M.D.   On: 03/21/2019 15:12        Scheduled Meds: . apixaban  5 mg Oral BID  . atorvastatin  80 mg Oral QHS  . clopidogrel  75 mg Oral Daily  . docusate sodium  200 mg Oral Daily  . insulin aspart  0-5 Units Subcutaneous  QHS  . insulin aspart  0-9 Units Subcutaneous TID WC  . insulin detemir  35 Units Subcutaneous QHS  . isosorbide mononitrate  120 mg Oral Daily  . levalbuterol  1.25 mg Nebulization BID  . metoprolol tartrate  50 mg Oral BID  . pantoprazole  40 mg Oral Daily   Continuous Infusions:  Assessment/Plan:   New onset A. Fib with RVR-Cardiology following,  she was managed with cardizem drip initially, now off and on oral metoprolol for rate control , Eliquis for anticoagulation as CHA2DS2-VASc = 8 (CHF, HTN, age >59, DM, stroke x2, CAD/PAD, female). She was planned for TEE cardioversion 2/22 by cardiology but converted spontaneously.Post conversion pause of 1.8 seconds noted on telemetry. Also had another pause of 2.8 seconds. Lopressor dose reduced by cardiology.   Chronic systolic CHF-recent hospitalization for  decompensation-Patient has low EF of 25%-30%- leg edema trace at this time, lungs clear and saturating well on RA. Rhythm control will help heart failure symptoms. Lasix held on admission due to concern of over diuresing given elevated creatinine--BNP last year was 400-600.  On 2/11 her BNP was at 2460.  On presentation, 2/20, her BNP was 2161 and today at 2115.  Chest x-ray on presentation showed resolving edema compared to prior.  Given her complaints overnight and persistent elevation in BNP, cardiology wanting to diurese her further.  I have discontinued IV fluids that were started yesterday and concern for AKI.  Will repeat chest x-ray and diuresis as needed after renal evaluation.  AKI onCKD (chronic kidney disease) stage 3, GFR 30-59 ml/min : secondary to cardiorenal syndrome in the setting of recent aggressive diuresis versus congested kidney.  Patient been on gentle IVF since  and diuretics been on hold since admission.  Renal USG--Echogenic renal parenchyma, suggesting medical renal disease.No hydronephrosis. Labs today show no improvement and given concern for residual pulmonary edema by cardiology, will hold off on IV fluids.  Cardiology prefers to avoid right heart cath to assess volume status given multiple comorbidities and renal failure.  Consulted nephrology and discussed with Dr. Gilberto Better await formal evaluation before diuresing further.  Dizziness/nausea: Given patient's report of feeling tight in her chest and throat on laying flat, nausea related to multiple medication intake, suspect reflux/associated bronchospasm symptoms.  On PPI daily as well as neb twice daily.  Cardiology contemplating changing Eliquis to Xarelto as it is the only new medication that has been started.  Treat CHF and renal failure (possibly uremic symptoms) as discussed above.  Symptomatic treatment.  Hyperkalemia: Lokelma given in the ED, potassium down from 5.8 to 4.8 this morning. Not on ACEI.    Insulin-dependent type 2 diabetes-A1c 8.6.Hold home medication Metformin, continue Levemir, SSI  Coronary artery disease-Continue plavix, statin. Now off ASA.   PAD (peripheral artery disease) - Continue plavix, statin. Also on anticoagulation now  Anemia of chronic disease-Hemoglobin at baseline , tolerating anticoagulation well   DVT prophylaxis: Eliquis Code Status: Full code Family / Patient Communication: Discussed with patient and all questions answered to satisfaction.  Discussed with cardiology/nephrology Disposition Plan:   Patient is from home prior to hospitalization. Received/Receiving inpatient care for new onset A,fib , cardiorenal syndrome/hyperkalemia/sinus pauses with dizziness Discharge when renal function stabilizes, off IVF, diuretics adjusted and cleared by cardiology.        LOS: 3 days    Time spent: 93 min    Guilford Shi, MD [facility] Pager in [place]  If 7PM-7AM, please contact night-coverage [email or web address] 03/23/2019, 1:47 PM

## 2019-03-23 NOTE — Consult Note (Signed)
[name] [name] Admit Date: 03/20/2019 03/23/2019 [name] [name] Requesting Physician:  Lajuana Ripple MD  Reason for Consult:  AoCKD HPI:  53 female admitted on 2/20 after presenting with chest pain and found to have atrial fibrillation with RVR.  With beta-blockade she eventually converted back to normal sinus rhythm has stayed that way since.  Her past medical history is significant for recent admission for decompensated combined systolic/diastolic heart failure, discharged on 2/16 after diuresis, significant CAD, PVD, hypertension, type 2 diabetes, hyperlipidemia, GERD, OA.  She appears to have some baseline CKD 3 with a creatinine of 1.3-1.7, quite a bit of lability.  Her discharge creatinine was 1.45 and a presenting creatinine 5 days later was 1.77 which is progressed to 2.3 today.  She denies use of NSAIDs or other medications started in the intra hospitalization time.    There is been some difficulty understanding her volume status.  She has received IV fluids and Lasix.  It appears that her weight when she presented with decompensated heart failure at 75.4 and was discharged at 74.7 kg.  Here she presented a 74.5 and today is 75.9 kg.  She states that her home weights never varied more than a pound in the time between admissions.  Last night she had an episode of dyspnea with anxiety while lying down flat and potentially orthopnea in nature; however, later that same evening she laid flat in bed and slept fine.  She has no significant peripheral edema.  No real change in her exertional symptoms.  UA on 2/21 with no hematuria or pyuria; there was 1+ proteinuria.  Renal ultrasound on 2/21 shows bilateral 10.1 cm kidneys without hydronephrosis, increased echogenicity is present.  Her medications are notable for furosemide 40 mg daily.  She received IV Lasix yesterday, none today.  She is off IV fluids.  Accurate I's and O's have been difficult, several unmeasured voids.  Creat (mg/dL)  Date Value   57/84/6962 0.73  09/19/2015 0.58  08/24/2015 0.61   Creatinine, Ser (mg/dL)  Date Value  95/28/4132 2.31 (H)  03/22/2019 1.95 (H)  03/21/2019 1.90 (H)  03/20/2019 1.77 (H)  03/15/2019 1.45 (H)  03/14/2019 1.51 (H)  03/13/2019 1.24 (H)  03/12/2019 1.16 (H)  03/11/2019 1.31 (H)  03/11/2019 1.34 (H)  ] I/Os: I/O last 3 completed shifts: In: 588.9 [P.O.:297; I.V.:291.9] Out: 700 [Urine:700]   ROS NSAIDS: Denies use IV Contrast no exposure TMP/SMX no exposure Hypotension not present Balance of 12 systems is negative w/ exceptions as above  PMH  Past Medical History:  Diagnosis Date  . Anemia 10/2015   Acute Blood Loss  . Arthritis    "feel like I have it all over" (08/28/2015)  . CHF (congestive heart failure) (HCC)   . Complication of anesthesia    DIFFICULT WAKING "only when I was smoking; no problems since I quit"  . Coronary artery disease   . Family history of adverse reaction to anesthesia    sister slow to wake up  . GERD (gastroesophageal reflux disease)    takes Protonix daily   . Hip bursitis   . History of blood transfusion    10/2015  . Hyperlipidemia LDL goal < 70 06/28/2013   takes Atorvastatin daily  . Hypertension    takes Metoprolol and Imdur daily  . Hypoxia 01/2018  . Malnutrition (HCC)   . Migraine    "none in a long time" (08/28/2015)  . Myocardial infarction (HCC) 2011  . Neuromuscular disorder (HCC)  DIABETIC NEUROPATHY  . Osteomyelitis (HCC) 2017   Left foot  . PAD (peripheral artery disease) (HCC)   . Peripheral vascular disease (HCC)   . Respiratory failure (HCC) 10/2015   Acute Hypoxia- acute pulmonary edema 11/13/2015  . Septic shock (HCC) 10/2015  . Stroke (HCC)   . Type II diabetes mellitus (HCC)    takes Lantus nightly.Average fasting blood sugar runs 80-90  Type II   PSH  Past Surgical History:  Procedure Laterality Date  . ABDOMINAL HYSTERECTOMY    . AMPUTATION Right 10/22/2016   Procedure: AMPUTATION DIGIT RIGHT  TOES 1-3 POSSIBLE TRANSMETATARSAL;  Surgeon: Chuck Hint, MD;  Location: [facility] OR;  Service: Vascular;  Laterality: Right;  . APPENDECTOMY    . ATHERECTOMY N/A 06/04/2011   Procedure: ATHERECTOMY;  Surgeon: Runell Gess, MD;  Location: [facility] CATH LAB;  Service: Cardiovascular;  Laterality: N/A;  . CARDIAC CATHETERIZATION  10/13/2009   95% stenosis in the AV groove circumflex and 95% ostial stenosis in small OM3. A 3x25mm drug-eluting Promus stent inserted ito the circumflex. Dilatated with a 3.25x5mm noncompliant Quantum balloon within entire segment. The entire region was reduced to 0% and brisk TIMI3 flow.  . CAROTID DUPLEX  03/19/2011   Right ICA-demonstrates complete occlusion. Left ICA-demonstrates a small amount of fibrous plaque.  Marland Kitchen CATARACT EXTRACTION W/ INTRAOCULAR LENS IMPLANT Right   . CESAREAN SECTION  1990  . CORONARY ANGIOPLASTY    . ENDARTERECTOMY FEMORAL Left 09/05/2015   Procedure: ENDARTERECTOMY FEMORAL WITH PROFUNDOPLASTY;  Surgeon: Chuck Hint, MD;  Location: [facility] OR;  Service: Vascular;  Laterality: Left;  Left common femoral artery vein patch using left saphenous vien  . ENDARTERECTOMY FEMORAL Right 10/22/2016   Procedure: ENDARTERECTOMY RIGHT COMMON FEMORAL;  Surgeon: Chuck Hint, MD;  Location: [facility] OR;  Service: Vascular;  Laterality: Right;  . FEMORAL-POPLITEAL BYPASS GRAFT Left 11/02/2015   Procedure: BYPASS GRAFT FEMORAL-POPLITEAL ARTERY VS FEMORAL-TIBIAL ARTERY BYPASS;  Surgeon: Chuck Hint, MD;  Location: [facility] OR;  Service: Vascular;  Laterality: Left;  . FEMORAL-POPLITEAL BYPASS GRAFT Right 10/22/2016   Procedure: BYPASS GRAFT RIGHT FEMORAL- BELOW KNEE POPLITEAL ARTERY WITH VEIN;  Surgeon: Chuck Hint, MD;  Location: [facility] OR;  Service: Vascular;  Laterality: Right;  . I & D EXTREMITY Left 11/10/2015   Procedure: Debridement Left Foot Ulcer, Application  Wound VAC;  Surgeon: [name], MD;  Location: [facility] OR;  Service:  Orthopedics;  Laterality: Left;  . ILIAC ARTERY STENT Left 08/28/2015   common  . INTRAOPERATIVE ARTERIOGRAM Left 09/05/2015   Procedure: INTRA OPERATIVE ARTERIOGRAM;  Surgeon: Chuck Hint, MD;  Location: [facility] OR;  Service: Vascular;  Laterality: Left;  . INTRAOPERATIVE ARTERIOGRAM Left 11/02/2015   Procedure: INTRA OPERATIVE ARTERIOGRAM;  Surgeon: Chuck Hint, MD;  Location: [facility] OR;  Service: Vascular;  Laterality: Left;  . INTRAOPERATIVE ARTERIOGRAM Right 10/22/2016   Procedure: INTRA OPERATIVE ARTERIOGRAM;  Surgeon: Chuck Hint, MD;  Location: [facility] OR;  Service: Vascular;  Laterality: Right;  . LEFT HEART CATH AND CORONARY ANGIOGRAPHY N/A 05/11/2018   Procedure: LEFT HEART CATH AND CORONARY ANGIOGRAPHY;  Surgeon: Lennette Bihari, MD;  Location: [facility] INVASIVE CV LAB;  Service: Cardiovascular;  Laterality: N/A;  Eugenie Birks MYOVIEW  10/25/2010   Moderate perfusion defect due to infarct/scar with mild perinfarct ischemia seen in the Basal Inferolateral, Basal Anterolateral, Mid Inferolateral, and Mid Anterolateral regions. Post-stress EF is 50%.  . LOWER EXTREMITY ANGIOGRAPHY N/A 10/17/2016   Procedure: Lower Extremity Angiography;  Surgeon: Lorretta Harp, MD;  Location: [place] CV LAB;  Service: Cardiovascular;  Laterality: N/A;  . OVARY SURGERY  1983?   "ruptured"  . PERIPHERAL VASCULAR ANGIOGRAM  01/26/2010   High-grade SFA disease: left greater than right. Left SFA would require fem-pop bypass grafting. Right SFA could be stented but might require Diamondback Orbital atherectomy.  Marland Kitchen PERIPHERAL VASCULAR ANGIOGRAM  02/23/2010   Stealth Predator orbital rotational atherectomy performed on SFA & Popliteal up to 90,000 RPM. Stenting using overlapping 5x16mm and 5x42mm Absolute Pro Nitinol self-expanding stents beginning just at the knee up to the mid SFA resulting in reduction of 90-95% calcified SFA & Popliteal stenosis to 0. Stenting performed on the distal common &  proximal iliac artery with a 10x4 Absolute Pro- 70-0%.  Marland Kitchen PERIPHERAL VASCULAR ANGIOGRAM  06/17/2010   PTA performed to the right external iliac artery stent using a 5x100 balloon at 10 atmospheres. Stenting performed using a 6x18 Genesis on Opta balloon. Postdilatation with a 7x2 balloon resulting in a 95% "in-stent" stenosis to 0% residual.  . PERIPHERAL VASCULAR ANGIOGRAM  06/04/2011   Bilateral total SFAs not percutaneously addressable. Good canidate for femoropopliteal bypass grafting  . PERIPHERAL VASCULAR ANGIOGRAM  08/28/2015  . PERIPHERAL VASCULAR CATHETERIZATION N/A 08/28/2015   Procedure: Lower Extremity Angiography;  Surgeon: Lorretta Harp, MD;  Location: [place] CV LAB;  Service: Cardiovascular;  Laterality: N/A;  . PERIPHERAL VASCULAR CATHETERIZATION N/A 08/28/2015   Procedure: Abdominal Aortogram;  Surgeon: Lorretta Harp, MD;  Location: [place] CV LAB;  Service: Cardiovascular;  Laterality: N/A;  . PERIPHERAL VASCULAR CATHETERIZATION Left 08/28/2015   Procedure: Peripheral Vascular Intervention;  Surgeon: Lorretta Harp, MD;  Location: [place] CV LAB;  Service: Cardiovascular;  Laterality: Left;  common iliac  . PERIPHERAL VASCULAR CATHETERIZATION Left 08/28/2015   Procedure: Peripheral Vascular Atherectomy;  Surgeon: Lorretta Harp, MD;  Location: [place] CV LAB;  Service: Cardiovascular;  Laterality: Left;  common iliac  . PERIPHERAL VASCULAR CATHETERIZATION N/A 09/28/2015   Procedure: Lower Extremity Angiography;  Surgeon: Lorretta Harp, MD;  Location: [place] CV LAB;  Service: Cardiovascular;  Laterality: N/A;  . PERIPHERAL VASCULAR CATHETERIZATION Left 09/28/2015   Procedure: Peripheral Vascular Intervention;  Surgeon: Lorretta Harp, MD;  Location: [place] CV LAB;  Service: Cardiovascular;  Laterality: Left CFA  PCI with 9 mm x 4 cm Abbott nitinol absolute Pro self-expanding stent     . SKIN SPLIT GRAFT Left 12/01/2015   Procedure: LEFT FOOT SKIN  GRAFT AND VAC;  Surgeon: Newt Minion, MD;  Location: [place];  Service: Orthopedics;  Laterality: Left;  . THROMBECTOMY FEMORAL ARTERY Right 10/22/2016   Procedure: THROMBECTOMY FEMORAL ARTERY;  Surgeon: Angelia Mould, MD;  Location: [place];  Service: Vascular;  Laterality: Right;  . TRANSTHORACIC ECHOCARDIOGRAM  10/17/2009   EF 45-50%, moderate hypokinesis of the entire inferolateral myocardium, mild concentric hypertrophy and mild regurg of the mitral valva.  Marland Kitchen VEIN HARVEST Left 11/02/2015   Procedure: LEFT GREATER SAPHENOUS VEIN HARVEST;  Surgeon: Angelia Mould, MD;  Location: [facility] OR;  Service: Vascular;  Laterality: Left;   FH  Family History  Problem Relation [name] of Onset  . Hypertension Mother   . Heart failure Mother   . Heart failure Father   . Stroke Father   . Diabetes Father    SH  reports that she quit smoking about 9 years ago. Her smoking use included cigarettes. She has a 61.50 pack-year smoking  history. She has never used smokeless tobacco. She reports that she does not drink alcohol or use drugs. Allergies  Allergies  Allergen Reactions  . Doxycycline Other (See Comments)    Lethargy   . Hydrochlorothiazide Other (See Comments)    Lethargy   . Latex Rash  . Penicillins Swelling and Rash    Pt states she has tolerated Keflex in the past without problems. States she may have tolerated Augmentin in the past but it caused GI upset. Has patient had a PCN reaction causing immediate rash, facial/tongue/throat swelling, SOB or lightheadedness with hypotension: Yes Has patient had a PCN reaction causing severe rash involving mucus membranes or skin necrosis: No Has patient had a PCN reaction that required hospitalization No Has patient had a PCN reaction occurring within the last 10 years: No   Home medications Prior to Admission medications   Medication Sig Start Date End Date Taking? Authorizing Provider  acetaminophen (TYLENOL) 325 MG tablet Take 650 mg by  mouth every 6 (six) hours as needed for mild pain, fever or headache.   Yes [provider]  aspirin EC 81 MG tablet Take 81 mg by mouth daily.   Yes [provider]  atorvastatin (LIPITOR) 80 MG tablet Take 80 mg by mouth at bedtime.    Yes [provider]  clopidogrel (PLAVIX) 75 MG tablet Take 75 mg by mouth daily.    Yes [provider]  furosemide (LASIX) 40 MG tablet Take 1 tablet (40 mg total) by mouth daily. 03/15/19  Yes Vann, Jessica U, DO  gabapentin (NEURONTIN) 300 MG capsule Take 1 capsule (300 mg total) by mouth at bedtime. Patient taking differently: Take 300 mg by mouth at bedtime as needed (for nerve pain).  05/13/18  Yes Zannie Cove, MD  isosorbide mononitrate (IMDUR) 120 MG 24 hr tablet Take 1 tablet (120 mg total) by mouth daily. 03/15/19  Yes Vann, Jessica U, DO  LEVEMIR FLEXTOUCH 100 UNIT/ML Pen Inject 35 Units into the skin at bedtime.  03/15/16  Yes [provider]  metFORMIN (GLUCOPHAGE) 1000 MG tablet Take 1,000 mg by mouth 2 (two) times daily with a meal.   Yes [provider]  metoprolol succinate (TOPROL-XL) 50 MG 24 hr tablet TAKE 1 AND 1/2 TABLETS BY MOUTH DAILY Patient taking differently: Take 75 mg by mouth daily.  04/15/18  Yes Lennette Bihari, MD  nitroGLYCERIN (NITROSTAT) 0.4 MG SL tablet Place 0.4 mg under the tongue every 5 (five) minutes as needed for chest pain.  05/15/18  Yes [provider]  pantoprazole (PROTONIX) 40 MG tablet Take 1 tablet (40 mg total) by mouth daily. 12/21/18  Yes Lennette Bihari, MD  VENTOLIN HFA 108 (90 Base) MCG/ACT inhaler Inhale 1 puff into the lungs every 4 (four) hours as needed for shortness of breath. 03/02/18  Yes [provider]    Current Medications Scheduled Meds: . apixaban  5 mg Oral BID  . atorvastatin  80 mg Oral QHS  . clopidogrel  75 mg Oral Daily  . docusate sodium  200 mg Oral Daily  . insulin aspart  0-5 Units Subcutaneous QHS  . insulin  aspart  0-9 Units Subcutaneous TID WC  . insulin detemir  35 Units Subcutaneous QHS  . isosorbide mononitrate  120 mg Oral Daily  . levalbuterol  1.25 mg Nebulization BID  . metoprolol tartrate  50 mg Oral BID  . pantoprazole  40 mg Oral Daily   Continuous Infusions: PRN Meds:.acetaminophen, albuterol,  nitroGLYCERIN, ondansetron (ZOFRAN) IV  CBC Recent Labs  Lab 03/20/19 1157 03/21/19 0319 03/22/19 0222  WBC 8.8 9.0 8.9  NEUTROABS 7.2  --  5.4  HGB 11.3* 9.2* 9.3*  HCT 35.9* 28.6* 29.1*  MCV 86.5 85.1 84.8  PLT 313 320 311   Basic Metabolic Panel Recent Labs  Lab 03/20/19 1157 03/21/19 0319 03/22/19 0222 03/23/19 0246  NA 137 136 136 138  K 5.8* 5.3* 5.1 4.8  CL 104 104 102 105  CO2 17* 21* 22 19*  GLUCOSE 229* 118* 123* 105*  BUN 50* 55* 57* 66*  CREATININE 1.77* 1.90* 1.95* 2.31*  CALCIUM 9.3 9.2 9.0 8.9    Physical Exam  Blood pressure 119/70, pulse 64, temperature (!) 97.5 F (36.4 C), temperature source Oral, resp. rate 19, height 5\' 4"  (1.626 m), weight 75.9 kg, SpO2 95 %. GEN: Well-appearing, sitting in chair, NAD ENT: NCAT EYES: EOMI CV: Regular, normal S1 and S2 PULM: Faint crackles in the bases otherwise clear throughout, normal work of breathing, speaks in full sentences ABD: Soft, nontender SKIN: No significant rashes or lesions EXT: No significant peripheral edema, trace in the legs in the past   Assessment 74F with AoCKD3 after presenting with atrial fibrillation with RVR and recent admission for decompensated systolic/diastolic heart failure improved with diuresis.  UA without features of glomerular process.  Renal ultrasound demonstrates some medical renal disease but no obstruction or explanatory findings.  Volume status is difficult here, I think she is probably close to euvolemic based upon weights, but there might be some extra volume present.  I suspect that her AKI is connected to her RVR, likely hemodynamic insult, hopefully not progressing  to a more dense ATN.  At this point I would hold diuretics for 24 hours and would not resume any IV fluids, see how she settles out tomorrow.  1. AKI, likely hemodynamic mediated #2 2. Atrial fibrillation, presenting with RVR, per cardiology, now in NSR 3. Combined systolic/diastolic heart failure, admitted earlier this month with decompensation 4. Anemia, normocytic; similar to previous values 5. PVD/CAD 6. DM2 7. Hyperlipidemia 8. GERD 9. OA 10. Hypertension  Plan 1. As above, hold further diuretics and IV fluids for 24 hours, reassess tomorrow.  I suspect this will resolve on its own  2. Daily weights, Daily Renal Panel, Strict I/Os, Avoid nephrotoxins (NSAIDs, judicious IV Contrast)     [name] [name] pgr 03/23/2019, 1:39 PM

## 2019-03-23 NOTE — Plan of Care (Signed)
  Problem: Health Behavior/Discharge Planning: Goal: Ability to manage health-related needs will improve Outcome: Progressing   Problem: Clinical Measurements: Goal: Respiratory complications will improve Outcome: Progressing Goal: Cardiovascular complication will be avoided Outcome: Progressing   Problem: Activity: Goal: Risk for activity intolerance will decrease Outcome: Progressing   Problem: Nutrition: Goal: Adequate nutrition will be maintained Outcome: Not Progressing Per report pt has complained of intermittent nausea.

## 2019-03-23 NOTE — Progress Notes (Addendum)
Progress Note  Patient Name: [name] [name] Date of Encounter: 03/23/2019  Primary Cardiologist: Nicki Guadalajara, MD   Subjective   Pt continues to complain of nausea. It sounds as though she had orthopnea last evening when trying to sleep, also an anxiety component (states she had a panic attack bc she felt as though she couldn't breathe). She is constipated - question if this is contributing to her nausea. She thinks she urinated more than normal yesterday.  Inpatient Medications    Scheduled Meds: . apixaban  5 mg Oral BID  . atorvastatin  80 mg Oral QHS  . clopidogrel  75 mg Oral Daily  . docusate sodium  200 mg Oral Daily  . insulin aspart  0-5 Units Subcutaneous QHS  . insulin aspart  0-9 Units Subcutaneous TID WC  . insulin detemir  35 Units Subcutaneous QHS  . isosorbide mononitrate  120 mg Oral Daily  . levalbuterol  1.25 mg Nebulization BID  . metoprolol tartrate  50 mg Oral BID  . pantoprazole  40 mg Oral Daily   Continuous Infusions: . sodium chloride 50 mL/hr at 03/22/19 1032   PRN Meds: acetaminophen, albuterol, nitroGLYCERIN, ondansetron (ZOFRAN) IV   Vital Signs    Vitals:   03/22/19 1211 03/22/19 1949 03/22/19 2136 03/23/19 0438  BP: (!) 116/59 (!) 123/58  135/74  Pulse: 71 72  62  Resp:      Temp: (!) 97.4 F (36.3 C) 97.8 F (36.6 C)  (!) 97.5 F (36.4 C)  TempSrc: Oral Oral  Oral  SpO2: 95% 99% 94% 100%  Weight:    75.9 kg  Height:        Intake/Output Summary (Last 24 hours) at 03/23/2019 0748 Last data filed at 03/22/2019 1800 Gross per 24 hour  Intake 588.93 ml  Output 700 ml  Net -111.07 ml   Last 3 Weights 03/23/2019 03/22/2019 03/21/2019  Weight (lbs) 167 lb 5.3 oz 166 lb 3.6 oz 164 lb 7.4 oz  Weight (kg) 75.9 kg 75.4 kg 74.6 kg      Telemetry    Remains in sinus rhythm with HR 50-60s - Personally Reviewed  ECG    No newt tracings - Personally Reviewed  Physical Exam   GEN: No acute distress.   Neck: + JVD Cardiac: RRR, no  murmurs, rubs, or gallops.  Respiratory: crackles in R > L base, respirations unlabored GI: Soft, nontender, non-distended  MS: + B LE edema with chronic skin chagnes. Neuro:  Nonfocal  Psych: Normal affect   Labs    High Sensitivity Troponin:   Recent Labs  Lab 03/11/19 1823 03/11/19 2030 03/12/19 0344 03/20/19 1157 03/20/19 1434  TROPONINIHS 123* 110* 115* 63* 1,069*      Chemistry Recent Labs  Lab 03/20/19 1157 03/20/19 1157 03/21/19 0319 03/22/19 0222 03/23/19 0246  NA 137   < > 136 136 138  K 5.8*   < > 5.3* 5.1 4.8  CL 104   < > 104 102 105  CO2 17*   < > 21* 22 19*  GLUCOSE 229*   < > 118* 123* 105*  BUN 50*   < > 55* 57* 66*  CREATININE 1.77*   < > 1.90* 1.95* 2.31*  CALCIUM 9.3   < > 9.2 9.0 8.9  PROT 7.3  --   --   --   --   ALBUMIN 3.5  --   --   --   --   AST 20  --   --   --   --  ALT 16  --   --   --   --   ALKPHOS 101  --   --   --   --   BILITOT 0.7  --   --   --   --   GFRNONAA 29*   < > 27* 26* 21*  GFRAA 34*   < > 31* 30* 24*  ANIONGAP 16*   < > 11 12 14    < > = values in this interval not displayed.     Hematology Recent Labs  Lab 03/20/19 1157 03/21/19 0319 03/22/19 0222  WBC 8.8 9.0 8.9  RBC 4.15 3.36* 3.43*  HGB 11.3* 9.2* 9.3*  HCT 35.9* 28.6* 29.1*  MCV 86.5 85.1 84.8  MCH 27.2 27.4 27.1  MCHC 31.5 32.2 32.0  RDW 15.0 15.2 15.1  PLT 313 320 311    BNP Recent Labs  Lab 03/20/19 1157  BNP 2,161.2*     DDimer No results for input(s): DDIMER in the last 168 hours.   Radiology    03/22/19 RENAL  Result Date: 03/21/2019 CLINICAL DATA:  Elevated creatinine EXAM: RENAL / URINARY TRACT ULTRASOUND COMPLETE COMPARISON:  Abdominal ultrasound dated 08/04/2008 FINDINGS: Right Kidney: Renal measurements: 10.1 x 4.2 x 5.0 cm = volume: 110 mL. Echogenic renal parenchyma. No mass or hydronephrosis visualized. Left Kidney: Renal measurements: 10.1 x 5.3 x 5.9 cm = volume: 166 mL. Echogenic renal parenchyma. No mass or hydronephrosis  visualized. Bladder: Underdistended but unremarkable. Other: None. IMPRESSION: Echogenic renal parenchyma, suggesting medical renal disease. No hydronephrosis. Electronically Signed   By: 10/05/2008 M.D.   On: 03/21/2019 15:12    Cardiac Studies   Echocardiogram 03/12/2019: Impressions: 1. Diffuse hypokinesis worse in the septum and inferior base. Left  ventricular ejection fraction, by estimation, is 25 to 30%. The left  ventricle has moderate to severely decreased function. The left ventricle  demonstrates global hypokinesis. The left  ventricular internal cavity size was moderately dilated. Left ventricular  diastolic parameters are consistent with Grade III diastolic dysfunction  (restrictive). Elevated left ventricular end-diastolic pressure.  2. Right ventricular systolic function is normal. The right ventricular  size is normal. There is moderately elevated pulmonary artery systolic  pressure.  3. Left atrial size was moderately dilated.  4. Right atrial size was mildly dilated.  5. The mitral valve is normal in structure and function. Mild mitral  valve regurgitation. No evidence of mitral stenosis.  6. The aortic valve is normal in structure and function. Aortic valve  regurgitation is not visualized. No aortic stenosis is present.  Patient Profile     69 y.o. 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  Assessment & Plan    1. Paroxysmal atrial fibrillation/flutter - initial EKG with coarse Afib RVR (HR 125), later telemetry evidence of atrial flutter with variable conduction - converted to sinus rhythm at 0520 03/22/19 with a short post-conversion pause - reduced lopressor to 50 mg BID yesterday - she remains in sinus rhythm This patients CHA2DS2-VASc Score and  unadjusted Ischemic Stroke Rate (% per year) is equal to 10.8 % stroke rate/year from a score of 47 (age, female, CHF, HTN, DM, CAD, 2stroke) - in anticipation of TEE/DCCV, she was loaded with 10 mg eliquis on 03/20/19, now on 5 mg BID   2. Demand  ischemia 3. CAD - hs troponin: 63 --> 1069 - underlying CAD not amenable to PCI or CABG - no further ischemia workup at this time - ASA D/C'ed in the setting of eliquis, continue plavix - continue BB, imdur and statin   4. Chronic combined systolic and diastolic heart failure - EF 25-30% and grade 3 DD - on recent echo 03/12/19 - received 40 mg IV lasix yesterday for exam suggestive of hyeprvolemia - unfortunately, I&Os not charged - weight is actually up 75.9 kg, from 74.5 kg on admission - sCr 2.31 (1.95) with K 4.8 - appreciate crackles in lung bases on exam - weight is up - will consider 80 mg IV lasix today, but may need nephrology consult for renal function - need I&Os   5. HTN - medications as above, pressure well-controlled   6. Hyperlipidemia 03/12/2019: Cholesterol 162; HDL 26; LDL Cholesterol 101; Triglycerides 176; VLDL 35 - Continue statin   7. Dizziness, nausea - pt continues to complain of nausea - questions if this could be constipation vs hypervolemia vs eliquis - has been given bowel regimen with option to escalate for BM today - may need higher dose of lasix given renal function - if she remains nauseous - may switch to xarelto        For questions or updates, please contact [place] Please consult [email or web address] for contact info under        Signed, Ledora Bottcher, PA  03/23/2019, 7:48 AM    Personally seen and examined. Agree with above.   She is going to have a nephrology consultation given her increasing BUN and creatinine.  Perhaps this AKI is secondary to her prior atrial fibrillation with rapid ventricular response and decreased cardiac output in the setting of her EF of 20 to 25%.  I do  hear crackles in her right lower lobe and she does have sensation of PND/orthopnea.  My hunch is that she may still have increased fluid intravascularly but this is very challenging to tell especially with her worsening renal function.  I agree with consultation.  We could always consider right heart catheterization to decide whether or not she is overloaded or not however I am not inclined to put her through another invasive procedure at this time.  She has significant left main disease circumflex disease, markedly reduced EF, quite high morbidity.  We will hold off on IV Lasix until nephrology sees her.  Appreciate correspondence with Dr. Earnest Conroy.  Candee Furbish, MD

## 2019-03-24 LAB — COMPREHENSIVE METABOLIC PANEL
ALT: 47 U/L — ABNORMAL HIGH (ref 0–44)
AST: 41 U/L (ref 15–41)
Albumin: 3.4 g/dL — ABNORMAL LOW (ref 3.5–5.0)
Alkaline Phosphatase: 135 U/L — ABNORMAL HIGH (ref 38–126)
Anion gap: 13 (ref 5–15)
BUN: 68 mg/dL — ABNORMAL HIGH (ref 8–23)
CO2: 20 mmol/L — ABNORMAL LOW (ref 22–32)
Calcium: 9.3 mg/dL (ref 8.9–10.3)
Chloride: 107 mmol/L (ref 98–111)
Creatinine, Ser: 2.04 mg/dL — ABNORMAL HIGH (ref 0.44–1.00)
GFR calc Af Amer: 28 mL/min — ABNORMAL LOW (ref >60)
GFR calc non Af Amer: 24 mL/min — ABNORMAL LOW (ref >60)
Glucose, Bld: 126 mg/dL — ABNORMAL HIGH (ref 70–99)
Potassium: 4.5 mmol/L (ref 3.5–5.1)
Sodium: 140 mmol/L (ref 135–145)
Total Bilirubin: 0.9 mg/dL (ref 0.3–1.2)
Total Protein: 6.6 g/dL (ref 6.5–8.1)

## 2019-03-24 LAB — GLUCOSE, CAPILLARY
Glucose-Capillary: 108 mg/dL — ABNORMAL HIGH (ref 70–99)
Glucose-Capillary: 234 mg/dL — ABNORMAL HIGH (ref 70–99)
Glucose-Capillary: 139 mg/dL — ABNORMAL HIGH (ref 70–99)
Glucose-Capillary: 205 mg/dL — ABNORMAL HIGH (ref 70–99)

## 2019-03-24 LAB — CBC
HCT: 29.4 % — ABNORMAL LOW (ref 36.0–46.0)
Hemoglobin: 9.4 g/dL — ABNORMAL LOW (ref 12.0–15.0)
MCH: 26.9 pg (ref 26.0–34.0)
MCHC: 32 g/dL (ref 30.0–36.0)
MCV: 84 fL (ref 80.0–100.0)
Platelets: 338 10*3/uL (ref 150–400)
RBC: 3.5 MIL/uL — ABNORMAL LOW (ref 3.87–5.11)
RDW: 15.2 % (ref 11.5–15.5)
WBC: 9.2 10*3/uL (ref 4.0–10.5)
nRBC: 0 % (ref 0.0–0.2)

## 2019-03-24 MED ORDER — MECLIZINE HCL 25 MG PO TABS
12.5000 mg | ORAL_TABLET | Freq: Two times a day (BID) | ORAL | Status: DC
  Administered 2019-03-24 – 2019-03-25 (×3): 12.5 mg via ORAL
  Filled 2019-03-24 (×3): qty 1

## 2019-03-24 MED ORDER — ISOSORBIDE MONONITRATE ER 60 MG PO TB24
90.0000 mg | ORAL_TABLET | Freq: Every day | ORAL | Status: DC
  Administered 2019-03-25: 90 mg via ORAL
  Filled 2019-03-24: qty 1

## 2019-03-24 NOTE — Plan of Care (Signed)
  Problem: Health Behavior/Discharge Planning: Goal: Ability to manage health-related needs will improve Outcome: Progressing   Problem: Clinical Measurements: Goal: Ability to maintain clinical measurements within normal limits will improve Outcome: Progressing   Problem: Nutrition: Goal: Adequate nutrition will be maintained Outcome: Progressing   Problem: Coping: Goal: Level of anxiety will decrease Outcome: Progressing   

## 2019-03-24 NOTE — Progress Notes (Signed)
Progress Note  Patient Name: [name] [name] Date of Encounter: 03/24/2019  Primary Cardiologist: Nicki Guadalajara, MD   Subjective   Still having nausea after taking her medications.  The only changes were increasing her isosorbide from 90-1 20 and the Eliquis.  Maybe will try to decrease her isosorbide back to 90 and see how she feels.  Inpatient Medications    Scheduled Meds: . apixaban  5 mg Oral BID  . atorvastatin  80 mg Oral QHS  . clopidogrel  75 mg Oral Daily  . docusate sodium  200 mg Oral Daily  . insulin aspart  0-5 Units Subcutaneous QHS  . insulin aspart  0-9 Units Subcutaneous TID WC  . insulin detemir  35 Units Subcutaneous QHS  . isosorbide mononitrate  120 mg Oral Daily  . levalbuterol  1.25 mg Nebulization BID  . metoprolol tartrate  50 mg Oral BID  . pantoprazole  40 mg Oral Daily   Continuous Infusions:  PRN Meds: acetaminophen, albuterol, nitroGLYCERIN, ondansetron (ZOFRAN) IV   Vital Signs    Vitals:   03/23/19 2111 03/24/19 0604 03/24/19 0757 03/24/19 0807  BP: (!) 139/59 110/79  137/64  Pulse: 80 64  69  Resp: 18   14  Temp:  (!) 97.5 F (36.4 C)    TempSrc:  Oral    SpO2: 94% 100% 99% 100%  Weight:  76.5 kg    Height:        Intake/Output Summary (Last 24 hours) at 03/24/2019 1009 Last data filed at 03/23/2019 2230 Gross per 24 hour  Intake 180 ml  Output 550 ml  Net -370 ml   Last 3 Weights 03/24/2019 03/23/2019 03/22/2019  Weight (lbs) 168 lb 10.4 oz 167 lb 5.3 oz 166 lb 3.6 oz  Weight (kg) 76.5 kg 75.9 kg 75.4 kg      Telemetry    Sinus rhythm no further atrial fibrillation- Personally Reviewed  ECG    No new- Personally Reviewed  Physical Exam   GEN: No acute distress.  Frail. Neck: No JVD Cardiac: RRR, no murmurs, rubs, or gallops.  Respiratory:  Right lower lobe crackle, we have heard throughout admission GI: Soft, nontender, non-distended  MS:  1+ bilateral lower extremity edema; No deformity. Neuro:  Nonfocal  Psych:  Normal affect   Labs    High Sensitivity Troponin:   Recent Labs  Lab 03/11/19 1823 03/11/19 2030 03/12/19 0344 03/20/19 1157 03/20/19 1434  TROPONINIHS 123* 110* 115* 63* 1,069*      Chemistry Recent Labs  Lab 03/20/19 1157 03/20/19 1157 03/21/19 0319 03/22/19 0222 03/23/19 0246  NA 137   < > 136 136 138  K 5.8*   < > 5.3* 5.1 4.8  CL 104   < > 104 102 105  CO2 17*   < > 21* 22 19*  GLUCOSE 229*   < > 118* 123* 105*  BUN 50*   < > 55* 57* 66*  CREATININE 1.77*   < > 1.90* 1.95* 2.31*  CALCIUM 9.3   < > 9.2 9.0 8.9  PROT 7.3  --   --   --   --   ALBUMIN 3.5  --   --   --   --   AST 20  --   --   --   --   ALT 16  --   --   --   --   ALKPHOS 101  --   --   --   --  BILITOT 0.7  --   --   --   --   GFRNONAA 29*   < > 27* 26* 21*  GFRAA 34*   < > 31* 30* 24*  ANIONGAP 16*   < > 11 12 14    < > = values in this interval not displayed.     Hematology Recent Labs  Lab 03/21/19 0319 03/22/19 0222 03/24/19 0854  WBC 9.0 8.9 9.2  RBC 3.36* 3.43* 3.50*  HGB 9.2* 9.3* 9.4*  HCT 28.6* 29.1* 29.4*  MCV 85.1 84.8 84.0  MCH 27.4 27.1 26.9  MCHC 32.2 32.0 32.0  RDW 15.2 15.1 15.2  PLT 320 311 338    BNP Recent Labs  Lab 03/20/19 1157 03/23/19 0930  BNP 2,161.2* 2,115.8*     DDimer No results for input(s): DDIMER in the last 168 hours.   Radiology    DG CHEST PORT 1 VIEW  Result Date: 03/23/2019 CLINICAL DATA:  69 year old female with CHF is aspiration. EXAM: PORTABLE CHEST 1 VIEW COMPARISON:  Chest radiograph dated 03/20/2019. FINDINGS: There is mild cardiomegaly with vascular congestion. A small right pleural effusion may be present. No consolidative changes. No pneumothorax. Atherosclerotic calcification of the aorta. No acute osseous pathology. IMPRESSION: Cardiomegaly with mild vascular congestion and probable small right pleural effusion. Electronically Signed   By: Anner Crete M.D.   On: 03/23/2019 17:04    Cardiac Studies   Echo 2021-EF  25% Cath 2021-severe left main disease not amenable to CABG or PCI.  Patient Profile     69 y.o. female with end-stage coronary artery disease severe left main disease not amenable to surgery or PCI with markedly reduced ejection fraction severe heart failure stage D NYHA class III-IV with acute kidney injury  Assessment & Plan    Severe CAD not amenable to CABG or PCI Paroxysmal atrial fibrillation Acute kidney injury Stage D ischemic cardiomyopathy with acute on chronic systolic heart failure Paroxysmal atrial fibrillation  -Continue with anticoagulation, Eliquis -Continue with metoprolol, maintaining sinus rhythm -Appreciate Dr. Joelyn Oms, [place] [facility].  Reviewed note.  Hopefully creatinine will settle out.  Holding off on IV Lasix for now. -I would not be an advocate for any invasive procedures at this point. -I think obtaining a palliative care consult would be helpful for her given her constellation of severe medical illnesses most notably coronary artery disease and systolic heart failure.  I did start the discussion with her possible DNR status.  -Her creatinine has come down slightly today.  She would really like to go home.  I think this is reasonable.  -She is taking Lasix 40 mg once a day at home, about we give her 1 more day off of Lasix and then resume Friday.  We will set close follow-up with Dr. Evette Georges APP team.  She also needs close follow-up with her primary care physician Dr. Lysle Rubens     For questions or updates, please contact [place] Please consult [email or web address] for contact info under        Signed, Candee Furbish, MD  03/24/2019, 10:09 AM

## 2019-03-24 NOTE — Progress Notes (Signed)
Admit: 03/20/2019 LOS: 4  39F with AoCKD3, likely related to AFib with RVR, hx/o s/d CHF  Subjective:  Marland Kitchen SCr improved off further diuretics/fluids . No further atrial fibrillation . Decent UOP, slightly net negative; weight is up 0.6 kg . On room air, stable mild edema . Maybe had trace orthopnea overnight . Main complaint now is dizziness  02/23 0701 - 02/24 0700 In: 402 [P.O.:402] Out: 850 [Urine:850]  Filed Weights   03/22/19 0617 03/23/19 0438 03/24/19 0604  Weight: 75.4 kg 75.9 kg 76.5 kg    Scheduled Meds: . apixaban  5 mg Oral BID  . atorvastatin  80 mg Oral QHS  . clopidogrel  75 mg Oral Daily  . docusate sodium  200 mg Oral Daily  . insulin aspart  0-5 Units Subcutaneous QHS  . insulin aspart  0-9 Units Subcutaneous TID WC  . insulin detemir  35 Units Subcutaneous QHS  . [START ON 03/25/2019] isosorbide mononitrate  90 mg Oral Daily  . levalbuterol  1.25 mg Nebulization BID  . meclizine  12.5 mg Oral BID  . metoprolol tartrate  50 mg Oral BID  . pantoprazole  40 mg Oral Daily   Continuous Infusions: PRN Meds:.acetaminophen, albuterol, nitroGLYCERIN, ondansetron (ZOFRAN) IV  Current Labs: reviewed   Physical Exam:  Blood pressure 137/64, pulse 69, temperature (!) 97.5 F (36.4 C), temperature source Oral, resp. rate 14, height 5\' 4"  (1.626 m), weight 76.5 kg, SpO2 100 %. GEN: Well-appearing, sitting in chair, NAD ENT: NCAT EYES: EOMI CV: Regular, normal S1 and S2 PULM:  Normal work of breathing, no crackles throughout ABD: Soft, nontender SKIN: No significant rashes or lesions EXT:  Trace peripheral edema in the distal legs  A 1. AKI, likely hemodynamic mediated #2; improving 2. Atrial fibrillation, presenting with RVR, per cardiology, now in NSR 3. Combined systolic/diastolic heart failure, admitted earlier this month with decompensation 4. Anemia, normocytic; similar to previous  values 5. PVD/CAD 6. DM2 7. Hyperlipidemia 8. GERD 9. OA 10. Hypertension  P . I would resume her home Lasix tomorrow . Will check in on her again tomorrow . Medication Issues; o Preferred narcotic agents for pain control are hydromorphone, fentanyl, and methadone. Morphine should not be used.  o Baclofen should be avoided o Avoid oral sodium phosphate and magnesium citrate based laxatives / bowel preps    MD 03/24/2019, 1:32 PM  Recent Labs  Lab 03/22/19 0222 03/23/19 0246 03/24/19 0854  NA 136 138 140  K 5.1 4.8 4.5  CL 102 105 107  CO2 22 19* 20*  GLUCOSE 123* 105* 126*  BUN 57* 66* 68*  CREATININE 1.95* 2.31* 2.04*  CALCIUM 9.0 8.9 9.3   Recent Labs  Lab 03/20/19 1157 03/20/19 1157 03/21/19 0319 03/22/19 0222 03/24/19 0854  WBC 8.8   < > 9.0 8.9 9.2  NEUTROABS 7.2  --   --  5.4  --   HGB 11.3*   < > 9.2* 9.3* 9.4*  HCT 35.9*   < > 28.6* 29.1* 29.4*  MCV 86.5   < > 85.1 84.8 84.0  PLT 313   < > 320 311 338   < > = values in this interval not displayed.

## 2019-03-24 NOTE — Progress Notes (Signed)
PROGRESS NOTE    [name] [name]  XVQ:[id number]  DOB: 1950-05-30  PCP: Wenda Low, MD Admit date:03/20/2019  69 y.o. female with medical history significant of HFrEF (EF 25%), CAD, PVD, HTN, DM2, HLD, GERD, arthritis who was recently hospitalized for CHF exacerbation, diuresed well and discharged home on 2/15, now presented with c/o chest pain--described as substernal chest pain, 10/10 in intensity, radiating to her arm and jaw with mild nausea and diaphoresis. She denied palpitations.  She called EMS and she was found to be in Afib.  She was given 200cc of IVF on the way to the hospital and felt better. ED Course: Afebrile, she was initially started on a cardizem drip and heparin for new diagnosis of A fib.  Cardiology was consulted and recommended QID beta blocker, eliquis (hold aspirin, continue plavix) given her depressed EF.  She was also noted to have a mildly elevated K to 5.8 (likely related to diuresis), worsening renal function with a Cr of 1.77 and BUN of 50 (ratio of 28 which suggests pre-renal), elevated BNP and elevated troponin Hospital course: Patient admitted to [facility] for further evaluation with cardiology consultations.  Subjective:  Patient appears comfortable, sitting on bedside chair, reports recurrence of nausea again this morning after taking medications, also reports an episode of dizziness last night when she tried to lay flat-now describes it as a spinning sensation with the room spinning left to right. Remains in sinus rhythm today.  Denies any chest pain-saturating 99% on RA  Objective: Vitals:   03/23/19 2111 03/24/19 0604 03/24/19 0757 03/24/19 0807  BP: (!) 139/59 110/79  137/64  Pulse: 80 64  69  Resp: 18   14  Temp:  (!) 97.5 F (36.4 C)    TempSrc:  Oral    SpO2: 94% 100% 99% 100%  Weight:  76.5 kg    Height:        Intake/Output Summary (Last 24 hours) at 03/24/2019 1448 Last data filed at 03/23/2019 2230 Gross per 24 hour  Intake --  Output 400 ml   Net -400 ml   Filed Weights   03/22/19 0617 03/23/19 0438 03/24/19 0604  Weight: 75.4 kg 75.9 kg 76.5 kg    Physical Examination:  General exam: Appears in mild distress due to nausea but no resp distress.  No nystagmus Respiratory system: Clear to auscultation, except for mild right basal crackles. Respiratory effort normal.  Saturating well on room air Cardiovascular system: S1 & S2 heard, RRR. No JVD, murmurs,  No pedal edema. Gastrointestinal system: Abdomen is nondistended, soft and nontender. Normal bowel sounds heard. Central nervous system: Alert and oriented. No new focal neurological deficits. Extremities: No contractures, edema or joint deformities.  Skin: No rashes, lesions or ulcers Psychiatry: Judgement and insight appear normal. Mood & affect anxious.   Data Reviewed: I have personally reviewed following labs and imaging studies  CBC: Recent Labs  Lab 03/20/19 1157 03/21/19 0319 03/22/19 0222 03/24/19 0854  WBC 8.8 9.0 8.9 9.2  NEUTROABS 7.2  --  5.4  --   HGB 11.3* 9.2* 9.3* 9.4*  HCT 35.9* 28.6* 29.1* 29.4*  MCV 86.5 85.1 84.8 84.0  PLT 313 320 311 093   Basic Metabolic Panel: Recent Labs  Lab 03/20/19 1157 03/21/19 0319 03/22/19 0222 03/23/19 0246 03/24/19 0854  NA 137 136 136 138 140  K 5.8* 5.3* 5.1 4.8 4.5  CL 104 104 102 105 107  CO2 17* 21* 22 19* 20*  GLUCOSE 229* 118* 123* 105*  126*  BUN 50* 55* 57* 66* 68*  CREATININE 1.77* 1.90* 1.95* 2.31* 2.04*  CALCIUM 9.3 9.2 9.0 8.9 9.3   GFR: Estimated Creatinine Clearance: 26.4 mL/min (A) (by C-G formula based on SCr of 2.04 mg/dL (H)). Liver Function Tests: Recent Labs  Lab 03/20/19 1157 03/24/19 0854  AST 20 41  ALT 16 47*  ALKPHOS 101 135*  BILITOT 0.7 0.9  PROT 7.3 6.6  ALBUMIN 3.5 3.4*   No results for input(s): LIPASE, AMYLASE in the last 168 hours. No results for input(s): AMMONIA in the last 168 hours. Coagulation Profile: Recent Labs  Lab 03/21/19 1947  INR 1.9*    Cardiac Enzymes: No results for input(s): CKTOTAL, CKMB, CKMBINDEX, TROPONINI in the last 168 hours. BNP (last 3 results) No results for input(s): PROBNP in the last 8760 hours. HbA1C: No results for input(s): HGBA1C in the last 72 hours. CBG: Recent Labs  Lab 03/23/19 1154 03/23/19 1656 03/23/19 2128 03/24/19 0759 03/24/19 1147  GLUCAP 137* 132* 264* 108* 234*   Lipid Profile: No results for input(s): CHOL, HDL, LDLCALC, TRIG, CHOLHDL, LDLDIRECT in the last 72 hours. Thyroid Function Tests: Recent Labs    03/23/19 0246  TSH 1.737   Anemia Panel: No results for input(s): VITAMINB12, FOLATE, FERRITIN, TIBC, IRON, RETICCTPCT in the last 72 hours. Sepsis Labs: No results for input(s): PROCALCITON, LATICACIDVEN in the last 168 hours.  Recent Results (from the past 240 hour(s))  SARS CORONAVIRUS 2 (TAT 6-24 HRS) Nasopharyngeal Nasopharyngeal Swab     Status: None   Collection Time: 03/20/19  1:15 PM   Specimen: Nasopharyngeal Swab  Result Value Ref Range Status   SARS Coronavirus 2 NEGATIVE NEGATIVE Final    Comment: (NOTE) SARS-CoV-2 target nucleic acids are NOT DETECTED. The SARS-CoV-2 RNA is generally detectable in upper and lower respiratory specimens during the acute phase of infection. Negative results do not preclude SARS-CoV-2 infection, do not rule out co-infections with other pathogens, and should not be used as the sole basis for treatment or other patient management decisions. Negative results must be combined with clinical observations, patient history, and epidemiological information. The expected result is Negative. Fact Sheet for Patients: HairSlick.no Fact Sheet for Healthcare Providers: quierodirigir.com This test is not yet approved or cleared by the [place] FDA and  has been authorized for detection and/or diagnosis of SARS-CoV-2 by FDA under an Emergency Use Authorization (EUA). This EUA  will remain  in effect (meaning this test can be used) for the duration of the COVID-19 declaration under Section 56 4(b)(1) of the Act, 21 U.S.C. section 360bbb-3(b)(1), unless the authorization is terminated or revoked sooner. Performed at [facility], [address], [place], [place] [address]       Radiology Studies: DG CHEST PORT 1 VIEW  Result Date: 03/23/2019 CLINICAL DATA:  69 year old female with CHF is aspiration. EXAM: PORTABLE CHEST 1 VIEW COMPARISON:  Chest radiograph dated 03/20/2019. FINDINGS: There is mild cardiomegaly with vascular congestion. A small right pleural effusion may be present. No consolidative changes. No pneumothorax. Atherosclerotic calcification of the aorta. No acute osseous pathology. IMPRESSION: Cardiomegaly with mild vascular congestion and probable small right pleural effusion. Electronically Signed   By: Elgie Collard M.D.   On: 03/23/2019 17:04        Scheduled Meds: . apixaban  5 mg Oral BID  . atorvastatin  80 mg Oral QHS  . clopidogrel  75 mg Oral Daily  . docusate sodium  200 mg Oral Daily  .  insulin aspart  0-5 Units Subcutaneous QHS  . insulin aspart  0-9 Units Subcutaneous TID WC  . insulin detemir  35 Units Subcutaneous QHS  . [START ON 03/25/2019] isosorbide mononitrate  90 mg Oral Daily  . levalbuterol  1.25 mg Nebulization BID  . meclizine  12.5 mg Oral BID  . metoprolol tartrate  50 mg Oral BID  . pantoprazole  40 mg Oral Daily   Continuous Infusions:  Assessment/Plan:   New onset A. Fib with RVR-Cardiology following,  she was managed with cardizem drip initially, now off and on oral metoprolol for rate control , Eliquis for anticoagulation as CHA2DS2-VASc = 8 (CHF, HTN, age >61, DM, stroke x2, CAD/PAD, female). She was planned for TEE cardioversion 2/22 by cardiology but converted spontaneously.Post conversion noted to have sinus pause of 1.8 seconds on telemetry. Also had another pause of 2.8 seconds. Lopressor  dose reduced by cardiology.   Chronic systolic CHF-recent hospitalization for decompensation-Patient has low EF of 25%-30%- leg edema trace at this time, lungs clear and saturating well on RA. Rhythm control will help heart failure symptoms. Lasix held on admission due to concern of over diuresing given elevated creatinine--BNP last year was 400-600.  On 2/11 her BNP was at 2460.  On presentation, 2/20, her BNP was 2161 and now at 2115.  Chest x-ray on presentation showed resolving edema compared to prior.  Given mild pulmonary edema on chest x-ray, crackles on clinical exam and persistent elevation in BNP, cardiology wanting to diurese her further but held in concern for renal dysfunction--seen by nephrology and okay to start p.o. Lasix in a.m.   AKI onCKD (chronic kidney disease) stage 3, GFR 30-59 ml/min : Per renal, likely secondary to hemodynamic changes with low blood pressure.  Diuretics held and patient received gentle IVF for a brief.  During hospitalization.  Been off IV fluids and diuretics since yesterday.  Renal function shows improvement today.  Nephrology following along, appreciate their input.  Renal USG--Echogenic renal parenchyma, suggesting medical renal disease.No hydronephrosis.  Can likely resume Lasix in a.m.  Dizziness/nausea: Today she describes as spinning sensation indicating vertigo.  She had vertigo in the remote past associated with pregnancy.  Will try Antivert twice daily to see if it will help with dizzy spells and nausea.  She also reports feeling somewhat dyspneic on laying flat-could be related to persistent mild pulmonary edema although saturating well on room air.  Can resume diuretics in a.m. per nephrology while watching renal function closely.  Hyperkalemia: Lokelma given in the ED, potassium down from 5.8 to 4.8 this morning. Not on ACEI.   Insulin-dependent type 2 diabetes-A1c 8.6.Hold home medication Metformin, continue Levemir, SSI  Coronary artery  disease-Continue plavix, statin. Now off ASA.   PAD (peripheral artery disease) - Continue plavix, statin. Also on anticoagulation now  Anemia of chronic disease-Hemoglobin at baseline , tolerating anticoagulation well   DVT prophylaxis: Eliquis Code Status: Full code Family / Patient Communication: Discussed with patient and all questions answered to satisfaction.  Discussed with cardiology/nephrology Disposition Plan:   Patient is from home prior to hospitalization. Received/Receiving inpatient care for new onset A,fib , cardiorenal syndrome/hyperkalemia/sinus pauses with dizziness/vertigo Discharge when renal function stabilizes, diuretics adjusted, dizziness resolves and cleared by cardiology/nephrology.        LOS: 4 days    Time spent: 35 min    Alessandra Bevels, MD [facility] Pager in [place]  If 7PM-7AM, please contact night-coverage [email or web address] 03/24/2019, 2:48 PM

## 2019-03-25 DIAGNOSIS — N179 Acute kidney failure, unspecified: Secondary | ICD-10-CM

## 2019-03-25 DIAGNOSIS — N1832 Chronic kidney disease, stage 3b: Secondary | ICD-10-CM

## 2019-03-25 DIAGNOSIS — R42 Dizziness and giddiness: Secondary | ICD-10-CM

## 2019-03-25 LAB — BASIC METABOLIC PANEL
Anion gap: 10 (ref 5–15)
BUN: 65 mg/dL — ABNORMAL HIGH (ref 8–23)
CO2: 21 mmol/L — ABNORMAL LOW (ref 22–32)
Calcium: 8.9 mg/dL (ref 8.9–10.3)
Chloride: 107 mmol/L (ref 98–111)
Creatinine, Ser: 1.92 mg/dL — ABNORMAL HIGH (ref 0.44–1.00)
GFR calc Af Amer: 30 mL/min — ABNORMAL LOW (ref >60)
GFR calc non Af Amer: 26 mL/min — ABNORMAL LOW (ref >60)
Glucose, Bld: 74 mg/dL (ref 70–99)
Potassium: 5 mmol/L (ref 3.5–5.1)
Sodium: 138 mmol/L (ref 135–145)

## 2019-03-25 LAB — GLUCOSE, CAPILLARY
Glucose-Capillary: 68 mg/dL — ABNORMAL LOW (ref 70–99)
Glucose-Capillary: 108 mg/dL — ABNORMAL HIGH (ref 70–99)
Glucose-Capillary: 276 mg/dL — ABNORMAL HIGH (ref 70–99)
Glucose-Capillary: 117 mg/dL — ABNORMAL HIGH (ref 70–99)

## 2019-03-25 MED ORDER — APIXABAN 5 MG PO TABS: 5.0000 mg | ORAL_TABLET | Freq: Two times a day (BID) | ORAL | 3 refills | Status: AC

## 2019-03-25 MED ORDER — FUROSEMIDE 10 MG/ML IJ SOLN
40.0000 mg | Freq: Once | INTRAMUSCULAR | Status: AC
  Administered 2019-03-25: 40 mg via INTRAVENOUS
  Filled 2019-03-25: qty 4

## 2019-03-25 MED ORDER — TRAMADOL HCL 50 MG PO TABS
50.0000 mg | ORAL_TABLET | Freq: Four times a day (QID) | ORAL | Status: AC | PRN
  Administered 2019-03-25: 50 mg via ORAL
  Filled 2019-03-25: qty 1

## 2019-03-25 MED ORDER — DOCUSATE SODIUM 100 MG PO CAPS: 200.0000 mg | ORAL_CAPSULE | Freq: Every day | ORAL | 0 refills | Status: DC

## 2019-03-25 MED ORDER — ONDANSETRON HCL 4 MG PO TABS: 4.0000 mg | ORAL_TABLET | Freq: Every day | ORAL | 1 refills | Status: DC | PRN

## 2019-03-25 MED ORDER — ISOSORBIDE MONONITRATE ER 30 MG PO TB24: 90.0000 mg | ORAL_TABLET | Freq: Every day | ORAL | 3 refills | Status: AC

## 2019-03-25 MED ORDER — MECLIZINE HCL 12.5 MG PO TABS: 12.5000 mg | ORAL_TABLET | Freq: Two times a day (BID) | ORAL | 0 refills | Status: DC | PRN

## 2019-03-25 MED ORDER — METOPROLOL SUCCINATE ER 100 MG PO TB24: ORAL_TABLET | ORAL | 3 refills | Status: DC

## 2019-03-25 MED FILL — ELIQUIS 5 MG TABLET: 5 | 30 days supply | Qty: 60 | Fill #0

## 2019-03-25 MED FILL — ONDANSETRON HCL 4 MG TABLET: 4 | 30 days supply | Qty: 30 | Fill #0

## 2019-03-25 MED FILL — MECLIZINE 12.5 MG CAPLET: 12.5 | 15 days supply | Qty: 30 | Fill #0

## 2019-03-25 NOTE — Care Management (Signed)
1246 03-25-19 Case Manager met with patient regarding Physical Therapy recommendations. Case Manager discussed patient returning home with home health services. Patient has declined [place] at this time. Patient states she has a rolling walker, rollator, and cane. Patient states that she has her daughter for support in the home. Patient is aware to contact her primary care provider if she needs home health services in the future. No further needs from Case Manager at this time. Bethena Roys, RN,BSN Case Manager

## 2019-03-25 NOTE — Consult Note (Signed)
   [facility] CM Inpatient Consult   03/25/2019  [name] [name] 1950/04/02 [id number]   Patientwasscreened for potential[facility]) Care Management servicesneeded underher Medicare/ [employer] insurance benefits, with7 day and 30 day readmissions, 22%high risk score forunplanned readmission and hospitalizations.  Review of medical record and MD notesrevealas: 69 y.o.femalewith medical history significant ofHFrEF (EF 25%), CAD, PVD, HTN, DM2, HLD, GERD, arthritis, who was recently hospitalized for CHF exacerbation, diuresed well and discharged home on 2/15,     presented with c/o chest pain--described as substernal chest pain, 10/10 in intensity, radiating to her arm and jaw with mild nausea and diaphoresis. She was found to be in A-fib.   [New onset A. Fib with RVR, Chronic systolic CHF-recent hospitalization for decompensation- BNP was at 2460, AKI onCKD, Dizziness/nausea]  Calledand was able speak topatient in the room as connected by staff at the nurses' station.HIPAA verified. Patientreports that she is to be discharge home soon. Discussed to [facility]regarding [facility] care management services, however, she verbalized not being interested with services at this time.  Patient denies having any health barriers or concerns for now and voiced having everything that she needs. She was adamant of receiving phone call follow-up post discharge. Patient was remindedof need for follow-up withprimary care provider post hospitalization and she states follow-up is within a week.    Patient's primary care provider is Dr. Georgann Housekeeper with [facility],officelisted as providing transitionof care follow-up.  Transition of care CM note reviewed showing that patient is returning home with home health services, however, patient has declined Home Health Services at this time, and has her daughter for support in the home.    For questions andreferral, please  contact:  [name] [name], BSN, RN-BC [facility] Liaison Cell: [phone]

## 2019-03-25 NOTE — Progress Notes (Addendum)
Admit: 03/20/2019 LOS: 5  71F with AoCKD3, likely related to AFib with RVR, hx/o s/d CHF  Subjective:  Strict ins/outs not available.  750 mL and one unmeasured void on 2/24.  Hoping to go home.   States best phone number is [phone].    Review of systems:  Denies n/v to me  Had some Shortness of breath last night  No cp   02/24 0701 - 02/25 0700 In: 877 [P.O.:877] Out: 750 [Urine:750]  Filed Weights   03/23/19 0438 03/24/19 0604 03/25/19 0427  Weight: 75.9 kg 76.5 kg 76.8 kg    Scheduled Meds: . apixaban  5 mg Oral BID  . atorvastatin  80 mg Oral QHS  . clopidogrel  75 mg Oral Daily  . docusate sodium  200 mg Oral Daily  . insulin aspart  0-5 Units Subcutaneous QHS  . insulin aspart  0-9 Units Subcutaneous TID WC  . insulin detemir  35 Units Subcutaneous QHS  . isosorbide mononitrate  90 mg Oral Daily  . levalbuterol  1.25 mg Nebulization BID  . meclizine  12.5 mg Oral BID  . metoprolol tartrate  50 mg Oral BID  . pantoprazole  40 mg Oral Daily   Continuous Infusions: PRN Meds:.acetaminophen, albuterol, nitroGLYCERIN, ondansetron (ZOFRAN) IV  Current Labs: reviewed   Physical Exam:  Blood pressure 131/82, pulse 73, temperature 97.8 F (36.6 C), temperature source Oral, resp. rate 18, height 5\' 4"  (1.626 m), weight 76.8 kg, SpO2 96 %.   GEN: Well-appearing, sitting in chair, NAD ENT: NCAT EYES: EOMI CV: Regular, normal S1 and S2 PULM:  basilar crackles.  Normal work of breathing on room air ABD: Soft, nontender SKIN: No significant rashes or lesions EXT:  1+ edema feet and lower legs  A 1. AKI, likely hemodynamic mediated with a fib RVR.  Improving.  UA on 2/21 with no hematuria or pyuria; there was 1+ proteinuria.  Renal ultrasound on 2/21 shows bilateral 10.1 cm kidneys without hydronephrosis, increased echogenicity is present. - Resume diuretics as below.  Discussed low potassium diet with the patient and asked that she not eat her banana or tomatoes today  or at home - she voiced understanding.  2. CKD stage III - baseline reported as 1.3 - 1.7 3. Atrial fibrillation, presenting with RVR, per cardiology, now in NSR 4. Combined systolic/diastolic heart failure, admitted earlier this month with decompensation.  Lasix 40 mg IV once now.  Can resume lasix 40 mg po daily tomorrow, 2/26 here or at home.  5. Anemia, normocytic; similar to previous values 6. PVD/CAD -noted.  Cardiology seeing. 7. DM2 - per primary team  8. Hypertension - acceptable control   Stable for discharge from a renal standpoint.  Will sign off.  Will set up follow-up with [facility] in two weeks.      Recent Labs  Lab 03/23/19 0246 03/24/19 0854 03/25/19 0337  NA 138 140 138  K 4.8 4.5 5.0  CL 105 107 107  CO2 19* 20* 21*  GLUCOSE 105* 126* 74  BUN 66* 68* 65*  CREATININE 2.31* 2.04* 1.92*  CALCIUM 8.9 9.3 8.9   Recent Labs  Lab 03/20/19 1157 03/20/19 1157 03/21/19 0319 03/22/19 0222 03/24/19 0854  WBC 8.8   < > 9.0 8.9 9.2  NEUTROABS 7.2  --   --  5.4  --   HGB 11.3*   < > 9.2* 9.3* 9.4*  HCT 35.9*   < > 28.6* 29.1* 29.4*  MCV 86.5   < >  85.1 84.8 84.0  PLT 313   < > 320 311 338   < > = values in this interval not displayed.     Estanislado Emms 03/25/2019 1:26 PM

## 2019-03-25 NOTE — Plan of Care (Signed)
  Problem: Education: Goal: Knowledge of General Education information will improve Description: Including pain rating scale, medication(s)/side effects and non-pharmacologic comfort measures Outcome: Progressing   Problem: Health Behavior/Discharge Planning: Goal: Ability to manage health-related needs will improve Outcome: Progressing   Problem: Clinical Measurements: Goal: Will remain free from infection Outcome: Progressing   Problem: Activity: Goal: Risk for activity intolerance will decrease Outcome: Progressing   Problem: Nutrition: Goal: Adequate nutrition will be maintained Outcome: Progressing   Problem: Coping: Goal: Level of anxiety will decrease Outcome: Progressing   Problem: Pain Managment: Goal: General experience of comfort will improve Outcome: Progressing   Problem: Safety: Goal: Ability to remain free from injury will improve Outcome: Progressing   

## 2019-03-25 NOTE — Discharge Summary (Addendum)
Physician Discharge Summary  [name] [name] YQM:[id number] DOB: 22-May-1950 DOA: 03/20/2019  PCP: Georgann Housekeeper, MD  Admit date: 03/20/2019 Discharge date: 03/25/2019 Consultations: Nephrology Dr Malen Gauze, Cardiology Dr Anne Fu Admitted From: home Disposition: home  Discharge Diagnoses:  Principal Problem:   Atrial fibrillation with RVR (HCC) Active Problems:   Chronic systolic and stage 3 diastolic CHF (congestive heart failure) (HCC)   AKI (acute kidney injury) (HCC)   PAD (peripheral artery disease) (HCC)   Coronary artery disease involving native coronary artery of native heart without angina pectoris   Diabetes mellitus (HCC)   Hyperlipidemia with target LDL less than 70   CKD (chronic kidney disease) stage 3b, GFR 30-50 ml/min   Elevated troponin   Chest pain   Vertigo   Hospital Course Summary: 69 y.o. female with medical history significant of HFrEF (EF 25%), CAD, PVD, HTN, DM2, HLD, GERD, arthritis who was recently hospitalized for CHF exacerbation, diuresed well and discharged home on 2/15, now presented with c/o chest pain--described as substernal chest pain, 10/10 in intensity, radiating to her arm and jaw with mild nausea and diaphoresis. She denied palpitations.  She called EMS and she was found to be in Afib.  She was given 200cc of IVF on the way to the hospital and felt better. ED Course: Afebrile, she was initially started on a cardizem drip and heparin for new diagnosis of A fib.  Cardiology was consulted and recommended QID beta blocker, eliquis (hold aspirin, continue plavix) given her depressed EF.  She was also noted to have a mildly elevated K to 5.8 (likely related to diuresis), worsening renal function with a Cr of 1.77 and BUN of 50 (ratio of 28 which suggests pre-renal), elevated BNP and elevated troponin Hospital course: Patient admitted to [facility] for further evaluation with cardiology consultation. RVR was controlled with adjustment of AV blockers.  Hospital course  however was complicated by AKI on chronic kidney disease requiring fine-tuning of diuretics under the supervision of nephrology/cardiology based on daily evaluations of fluid status.Lasix held on admission due to concern of over diuresing given elevated creatinine--BNP last year was 400-600.  On 2/11 her BNP was at 2460.  On presentation, 2/20, her BNP was 2161 and remained ~2100 on repeat check while here.  Chest x-ray on admission did report mild pulmonary edema but improved compared to recent admission.  Patient was followed closely by cardiology.  Patient also complained of dizzy spells with spinning sensation during the hospital course.  New onset A. Fib with RVR-Cardiology following,  she was managed with cardizem drip initially, now off and on oral metoprolol for rate control , Eliquis for anticoagulation as CHA2DS2-VASc = 8 (CHF, HTN, age >7, DM, stroke x2, CAD/PAD, female). She was planned for TEE cardioversion 2/22 by cardiology but converted spontaneously.Post conversion noted to have sinus pause of 1.8 seconds on telemetry. Also had another pause of 2.8 seconds. Lopressor dose reduced from  q6 hrs that she was receiving to 50 mg twice daily by cardiology after which she did not have any further pauses.  Prior to admission she was on Toprol-XL 75 mg--upon discharge metoprolol 50 mg twice daily was consolidated to Toprol-XL 100 mg.   AKI on CKD (chronic kidney disease) stage 3b, GFR 30-59 ml/min : Per renal, likely secondary to hemodynamic changes with low blood pressure.  Diuretics held and patient received gentle IVF for a brief.  During hospitalization.  Been off IV fluids and diuretics since yesterday.  Renal function shows improvement today.  Nephrology following along, appreciate their input.  Renal USG--Echogenic renal parenchyma, suggesting medical renal disease.No hydronephrosis. Per nephrology, okay to resume home Lasix from a.m. upon discharge.  Patient did receive 1 dose of Lasix IV  prior to discharge.   Chronic combined systolic and diastolic CHF-recent hospitalization for decompensation-Patient has low EF of 25%-30% and also stage 3 diastolic dysfunction per last echo on 03/12/19- leg edema trace at this time, lungs clear and saturating well on RA. Chest x-ray on presentation showed resolving edema compared to prior.  Given mild pulmonary edema on chest x-ray, crackles on clinical exam and persistent elevation in BNP, cardiology recommended to resume diuretics as soon as possible-seen by nephrology while here for AKI and as discussed above, patient received ginger hydration and then monitor off IV fluids/diuretics for few days with improvement in renal function.  Per nephrology, okay to resume home Lasix from a.m. upon discharge.  Patient not on ACE inhibitors due to AKI and hyperkalemia.  Dizziness/nausea:  Patient initially described feeling nauseous and dizzy on laying flat along with some dyspnea but later described as spinning sensation indicating vertigo.  She had vertigo in the remote past associated with pregnancy.  Tried Antivert twice daily to see if it will help with dizzy spells and nausea--she does feel much improved today.  She also reports feeling somewhat dyspneic on laying flat-could be related to persistent mild residual pulmonary edema although saturating well on room air at rest and on walking with PT today. Can resume diuretics in a.m. per nephrology as discussed above.  Cardiology also reduced Imdur dosage from 120 mg to 90 mg hoping this will improve her symptoms.   Hyperkalemia: Lokelma given in the ED, potassium down from 5.8 to 4.8 now.  Not on ACEI.    Insulin-dependent type 2 diabetes-A1c 8.6.Hold home medication Metformin due to renal dysfunction, continue Levemir, SSI   Coronary artery disease-Continue plavix, statin. Now off ASA.    PAD (peripheral artery disease) - Continue plavix, statin. Also on anticoagulation now   Anemia of chronic  disease-Hemoglobin at baseline , tolerating anticoagulation well    Discharge Exam:  Vitals:   03/25/19 0852 03/25/19 1338  BP:  (!) 119/47  Pulse:  66  Resp:    Temp:  98.2 F (36.8 C)  SpO2: 96% 96%   Vitals:   03/25/19 0842 03/25/19 0845 03/25/19 0852 03/25/19 1338  BP: 131/82 131/82  (!) 119/47  Pulse: 74 73  66  Resp:  18    Temp:    98.2 F (36.8 C)  TempSrc:    Oral  SpO2:  98% 96% 96%  Weight:      Height:        General: Pt is alert, awake, not in acute distress Cardiovascular: RRR, S1/S2 +, no rubs, no gallops Respiratory: CTA bilaterally, no wheezing, no rhonchi Abdominal: Soft, NT, ND, bowel sounds + Extremities: no edema, no cyanosis  Discharge Condition:Stable CODE STATUS: Diet recommendation: Recommendations for Outpatient Follow-up:  Follow up with PCP: 5 days Follow up with consultants : Cardiology, Dr. Marlou Porch on 3/4 and nephrology Dr. Royce Macadamia in 2 weeks Please obtain follow up labs including: CBC, Brock services upon discharge: None Equipment/Devices upon discharge: None   Discharge Instructions:  Discharge Instructions     (HEART FAILURE PATIENTS) Call MD:  Anytime you have any of the following symptoms: 1) 3 pound weight gain in 24 hours or 5 pounds in 1 week 2) shortness of breath, with or without  a dry hacking cough 3) swelling in the hands, feet or stomach 4) if you have to sleep on extra pillows at night in order to breathe.   Complete by: As directed    Call MD for:  difficulty breathing, headache or visual disturbances   Complete by: As directed    Call MD for:  extreme fatigue   Complete by: As directed    Call MD for:  persistant dizziness or light-headedness   Complete by: As directed    Call MD for:  persistant nausea and vomiting   Complete by: As directed    Call MD for:  temperature >100.4   Complete by: As directed    Diet - low sodium heart healthy   Complete by: As directed    Increase activity slowly    Complete by: As directed       Allergies as of 03/25/2019       Reactions   Doxycycline Other (See Comments)   Lethargy   Hydrochlorothiazide Other (See Comments)   Lethargy   Latex Rash   Penicillins Swelling, Rash   Pt states she has tolerated Keflex in the past without problems. States she may have tolerated Augmentin in the past but it caused GI upset. Has patient had a PCN reaction causing immediate rash, facial/tongue/throat swelling, SOB or lightheadedness with hypotension: Yes Has patient had a PCN reaction causing severe rash involving mucus membranes or skin necrosis: No Has patient had a PCN reaction that required hospitalization No Has patient had a PCN reaction occurring within the last 10 years: No        Medication List     STOP taking these medications    aspirin EC 81 MG tablet   metFORMIN 1000 MG tablet Commonly known as: GLUCOPHAGE       TAKE these medications    acetaminophen 325 MG tablet Commonly known as: TYLENOL Take 650 mg by mouth every 6 (six) hours as needed for mild pain, fever or headache.   apixaban 5 MG Tabs tablet Commonly known as: ELIQUIS Take 1 tablet (5 mg total) by mouth 2 (two) times daily.   atorvastatin 80 MG tablet Commonly known as: LIPITOR Take 80 mg by mouth at bedtime.   clopidogrel 75 MG tablet Commonly known as: PLAVIX Take 75 mg by mouth daily.   docusate sodium 100 MG capsule Commonly known as: COLACE Take 2 capsules (200 mg total) by mouth daily. Start taking on: March 26, 2019   furosemide 40 MG tablet Commonly known as: LASIX Take 1 tablet (40 mg total) by mouth daily.   gabapentin 300 MG capsule Commonly known as: NEURONTIN Take 1 capsule (300 mg total) by mouth at bedtime. What changed:  when to take this reasons to take this   isosorbide mononitrate 30 MG 24 hr tablet Commonly known as: IMDUR Take 3 tablets (90 mg total) by mouth daily. What changed:  medication strength how much to  take   Levemir FlexTouch 100 UNIT/ML Pen Generic drug: Insulin Detemir Inject 35 Units into the skin at bedtime.   meclizine 12.5 MG tablet Commonly known as: ANTIVERT Take 1 tablet (12.5 mg total) by mouth 2 (two) times daily as needed for dizziness or nausea.   metoprolol succinate 100 MG 24 hr tablet Commonly known as: TOPROL-XL TAKE 1 AND 1/2 TABLETS BY MOUTH DAILY What changed: medication strength   nitroGLYCERIN 0.4 MG SL tablet Commonly known as: NITROSTAT Place 0.4 mg under the tongue every 5 (five) minutes  as needed for chest pain.   ondansetron 4 MG tablet Commonly known as: Zofran Take 1 tablet (4 mg total) by mouth daily as needed for nausea or vomiting.   pantoprazole 40 MG tablet Commonly known as: PROTONIX Take 1 tablet (40 mg total) by mouth daily.   Ventolin HFA 108 (90 Base) MCG/ACT inhaler Generic drug: albuterol Inhale 1 puff into the lungs every 4 (four) hours as needed for shortness of breath.       Follow-up Information     Corrin Parker, PA-C Follow up.   Specialties: Physician Assistant, Cardiology Why: You have a hospital follow-up scheduled for 04/01/2019 at 1:30pm. Please arrive 15 minutes early for check-in. If this date/time does not work for you, please call our office to reschedule. Contact information: [address] [place] [address] [phone]           Allergies  Allergen Reactions   Doxycycline Other (See Comments)    Lethargy    Hydrochlorothiazide Other (See Comments)    Lethargy    Latex Rash   Penicillins Swelling and Rash    Pt states she has tolerated Keflex in the past without problems. States she may have tolerated Augmentin in the past but it caused GI upset. Has patient had a PCN reaction causing immediate rash, facial/tongue/throat swelling, SOB or lightheadedness with hypotension: Yes Has patient had a PCN reaction causing severe rash involving mucus membranes or skin necrosis: No Has  patient had a PCN reaction that required hospitalization No Has patient had a PCN reaction occurring within the last 10 years: No      The results of significant diagnostics from this hospitalization (including imaging, microbiology, ancillary and laboratory) are listed below for reference.    Labs: BNP (last 3 results) Recent Labs    03/11/19 1823 03/20/19 1157 03/23/19 0930  BNP 2,460.7* 2,161.2* 2,115.8*   Basic Metabolic Panel: Recent Labs  Lab 03/21/19 0319 03/22/19 0222 03/23/19 0246 03/24/19 0854 03/25/19 0337  NA 136 136 138 140 138  K 5.3* 5.1 4.8 4.5 5.0  CL 104 102 105 107 107  CO2 21* 22 19* 20* 21*  GLUCOSE 118* 123* 105* 126* 74  BUN 55* 57* 66* 68* 65*  CREATININE 1.90* 1.95* 2.31* 2.04* 1.92*  CALCIUM 9.2 9.0 8.9 9.3 8.9   Liver Function Tests: Recent Labs  Lab 03/20/19 1157 03/24/19 0854  AST 20 41  ALT 16 47*  ALKPHOS 101 135*  BILITOT 0.7 0.9  PROT 7.3 6.6  ALBUMIN 3.5 3.4*   No results for input(s): LIPASE, AMYLASE in the last 168 hours. No results for input(s): AMMONIA in the last 168 hours. CBC: Recent Labs  Lab 03/20/19 1157 03/21/19 0319 03/22/19 0222 03/24/19 0854  WBC 8.8 9.0 8.9 9.2  NEUTROABS 7.2  --  5.4  --   HGB 11.3* 9.2* 9.3* 9.4*  HCT 35.9* 28.6* 29.1* 29.4*  MCV 86.5 85.1 84.8 84.0  PLT 313 320 311 338   Cardiac Enzymes: No results for input(s): CKTOTAL, CKMB, CKMBINDEX, TROPONINI in the last 168 hours. BNP: Invalid input(s): POCBNP CBG: Recent Labs  Lab 03/24/19 1729 03/24/19 2124 03/25/19 0743 03/25/19 0831 03/25/19 1113  GLUCAP 139* 205* 68* 108* 276*   D-Dimer No results for input(s): DDIMER in the last 72 hours. Hgb A1c No results for input(s): HGBA1C in the last 72 hours. Lipid Profile No results for input(s): CHOL, HDL, LDLCALC, TRIG, CHOLHDL, LDLDIRECT in the last 72 hours. Thyroid function studies Recent  Labs    03/23/19 0246  TSH 1.737   Anemia work up No results for input(s):  VITAMINB12, FOLATE, FERRITIN, TIBC, IRON, RETICCTPCT in the last 72 hours. Urinalysis    Component Value Date/Time   COLORURINE YELLOW 03/21/2019 1543   APPEARANCEUR CLEAR 03/21/2019 1543   LABSPEC 1.013 03/21/2019 1543   PHURINE 5.0 03/21/2019 1543   GLUCOSEU NEGATIVE 03/21/2019 1543   HGBUR NEGATIVE 03/21/2019 1543   BILIRUBINUR NEGATIVE 03/21/2019 1543   KETONESUR NEGATIVE 03/21/2019 1543   PROTEINUR 30 (A) 03/21/2019 1543   UROBILINOGEN 1.0 10/16/2009 0243   NITRITE NEGATIVE 03/21/2019 1543   LEUKOCYTESUR NEGATIVE 03/21/2019 1543   Sepsis Labs Invalid input(s): PROCALCITONIN,  WBC,  LACTICIDVEN Microbiology Recent Results (from the past 240 hour(s))  SARS CORONAVIRUS 2 (TAT 6-24 HRS) Nasopharyngeal Nasopharyngeal Swab     Status: None   Collection Time: 03/20/19  1:15 PM   Specimen: Nasopharyngeal Swab  Result Value Ref Range Status   SARS Coronavirus 2 NEGATIVE NEGATIVE Final    Comment: (NOTE) SARS-CoV-2 target nucleic acids are NOT DETECTED. The SARS-CoV-2 RNA is generally detectable in upper and lower respiratory specimens during the acute phase of infection. Negative results do not preclude SARS-CoV-2 infection, do not rule out co-infections with other pathogens, and should not be used as the sole basis for treatment or other patient management decisions. Negative results must be combined with clinical observations, patient history, and epidemiological information. The expected result is Negative. Fact Sheet for Patients: HairSlick.no Fact Sheet for Healthcare Providers: quierodirigir.com This test is not yet approved or cleared by the [place] FDA and  has been authorized for detection and/or diagnosis of SARS-CoV-2 by FDA under an Emergency Use Authorization (EUA). This EUA will remain  in effect (meaning this test can be used) for the duration of the COVID-19 declaration under Section 56 4(b)(1) of the  Act, 21 U.S.C. section 360bbb-3(b)(1), unless the authorization is terminated or revoked sooner. Performed at [facility], [address], [place], [place] [address]     Procedures/Studies: DG Chest 2 View  Result Date: 03/11/2019 CLINICAL DATA:  Chest pain EXAM: CHEST - 2 VIEW COMPARISON:  May 08, 2018 FINDINGS: The heart size and mediastinal contours are unchanged with mild prominence. Aortic knob calcifications. There is mildly increased interstitial markings seen throughout both lungs. No large airspace consolidation or pleural effusion. No acute osseous abnormality. Again noted is a loose body around the right shoulder. IMPRESSION: Mildly increased interstitial markings throughout both lungs which could be due to interstitial edema. Electronically Signed   By: Jonna Clark M.D.   On: 03/11/2019 18:53   US RENAL  Result Date: 03/21/2019 CLINICAL DATA:  Elevated creatinine EXAM: RENAL / URINARY TRACT ULTRASOUND COMPLETE COMPARISON:  Abdominal ultrasound dated 08/04/2008 FINDINGS: Right Kidney: Renal measurements: 10.1 x 4.2 x 5.0 cm = volume: 110 mL. Echogenic renal parenchyma. No mass or hydronephrosis visualized. Left Kidney: Renal measurements: 10.1 x 5.3 x 5.9 cm = volume: 166 mL. Echogenic renal parenchyma. No mass or hydronephrosis visualized. Bladder: Underdistended but unremarkable. Other: None. IMPRESSION: Echogenic renal parenchyma, suggesting medical renal disease. No hydronephrosis. Electronically Signed   By: Charline Bills M.D.   On: 03/21/2019 15:12   DG CHEST PORT 1 VIEW  Result Date: 03/23/2019 CLINICAL DATA:  69 year old female with CHF is aspiration. EXAM: PORTABLE CHEST 1 VIEW COMPARISON:  Chest radiograph dated 03/20/2019. FINDINGS: There is mild cardiomegaly with vascular congestion. A small right pleural effusion may be present. No consolidative  changes. No pneumothorax. Atherosclerotic calcification of the aorta. No acute osseous pathology. IMPRESSION:  Cardiomegaly with mild vascular congestion and probable small right pleural effusion. Electronically Signed   By: Elgie Collard M.D.   On: 03/23/2019 17:04   DG CHEST PORT 1 VIEW  Result Date: 03/20/2019 CLINICAL DATA:  Difficulty breathing. Lower lobe crackles. EXAM: PORTABLE CHEST 1 VIEW COMPARISON:  Radiograph earlier this day CT 02/14/2018 FINDINGS: Persistent but improved interstitial prominence from prior exam. Stable cardiomegaly with aortic atherosclerosis. No large pleural effusion. No focal airspace disease. No pneumothorax. Stable osseous structures. IMPRESSION: Interstitial prominence is diminished from prior exam likely resolving edema. Stable cardiomegaly. No new abnormality from earlier this day. Aortic Atherosclerosis (ICD10-I70.0). Electronically Signed   By: Narda Rutherford M.D.   On: 03/20/2019 22:57   DG Chest Port 1 View  Result Date: 03/20/2019 CLINICAL DATA:  Chest pain and shortness of breath beginning this morning. EXAM: PORTABLE CHEST 1 VIEW COMPARISON:  03/11/2019 and 02/19/2018 FINDINGS: Lungs are adequately inflated without lobar consolidation or effusion. Mild stable diffuse interstitial prominence. Mild stable cardiomegaly. Remainder of the exam is unchanged. IMPRESSION: No acute findings. Mild stable cardiomegaly and bilateral interstitial prominence. Electronically Signed   By: Elberta Fortis M.D.   On: 03/20/2019 12:18   ECHOCARDIOGRAM COMPLETE  Result Date: 03/12/2019    ECHOCARDIOGRAM REPORT   Patient Name:   [name] [name] Date of Exam: 03/12/2019 Medical Rec #:  [id number]    Height:       64.0 in Accession #:    [id number]   Weight:       166.2 lb Date of Birth:  03-21-1950    BSA:          1.81 m Patient Age:    68 years     BP:           92/67 mmHg Patient Gender: F            HR:           96 bpm. Exam Location:  Inpatient Procedure: 2D Echo, Cardiac Doppler and Color Doppler Indications:    CHF-Acute Systolic 428.21  History:        Patient has prior history of  Echocardiogram examinations, most                 recent 05/09/2018. CHF, CAD and Previous Myocardial Infarction,                 Stroke, Signs/Symptoms:Hypotension and Dyspnea; Risk                 Factors:Diabetes, Dyslipidemia and Former Smoker. PVD. PAD.  Sonographer:    Renella Cunas RDCS Referring Phys: [id number] JAMES KIM IMPRESSIONS  1. Diffuse hypokinesis worse in the septum and inferior base. Left ventricular ejection fraction, by estimation, is 25 to 30%. The left ventricle has moderate to severely decreased function. The left ventricle demonstrates global hypokinesis. The left ventricular internal cavity size was moderately dilated. Left ventricular diastolic parameters are consistent with Grade III diastolic dysfunction (restrictive). Elevated left ventricular end-diastolic pressure.  2. Right ventricular systolic function is normal. The right ventricular size is normal. There is moderately elevated pulmonary artery systolic pressure.  3. Left atrial size was moderately dilated.  4. Right atrial size was mildly dilated.  5. The mitral valve is normal in structure and function. Mild mitral valve regurgitation. No evidence of mitral stenosis.  6. The aortic valve is normal in structure and function. Aortic valve  regurgitation is not visualized. No aortic stenosis is present. FINDINGS  Left Ventricle: Diffuse hypokinesis worse in the septum and inferior base. Left ventricular ejection fraction, by estimation, is 25 to 30%. The left ventricle has moderate to severely decreased function. The left ventricle demonstrates global hypokinesis. The left ventricular internal cavity size was moderately dilated. There is no left ventricular hypertrophy. Left ventricular diastolic parameters are consistent with Grade III diastolic dysfunction (restrictive). Elevated left ventricular end-diastolic pressure. Right Ventricle: The right ventricular size is normal. No increase in right ventricular wall thickness. Right ventricular  systolic function is normal. There is moderately elevated pulmonary artery systolic pressure. The tricuspid regurgitant velocity is 2.94 m/s, and with an assumed right atrial pressure of 15 mmHg, the estimated right ventricular systolic pressure is 49.6 mmHg. Left Atrium: Left atrial size was moderately dilated. Right Atrium: Right atrial size was mildly dilated. Pericardium: There is no evidence of pericardial effusion. Mitral Valve: The mitral valve is normal in structure and function. There is mild thickening of the mitral valve leaflet(s). There is mild calcification of the mitral valve leaflet(s). Normal mobility of the mitral valve leaflets. Mild to moderate mitral  annular calcification. Mild mitral valve regurgitation. No evidence of mitral valve stenosis. Tricuspid Valve: The tricuspid valve is normal in structure. Tricuspid valve regurgitation is mild . No evidence of tricuspid stenosis. Aortic Valve: The aortic valve is normal in structure and function. Aortic valve regurgitation is not visualized. No aortic stenosis is present. Pulmonic Valve: The pulmonic valve was normal in structure. Pulmonic valve regurgitation is not visualized. No evidence of pulmonic stenosis. Aorta: The aortic root is normal in size and structure. IAS/Shunts: No atrial level shunt detected by color flow Doppler.  LEFT VENTRICLE PLAX 2D LVIDd:         5.55 cm      Diastology LVIDs:         4.59 cm      LV e' lateral:   5.50 cm/s LV PW:         0.85 cm      LV E/e' lateral: 20.0 LV IVS:        0.86 cm      LV e' medial:    2.81 cm/s LVOT diam:     1.90 cm      LV E/e' medial:  39.1 LV SV:         34.31 ml LV SV Index:   28.76 LVOT Area:     2.84 cm  LV Volumes (MOD) LV vol d, MOD A2C: 166.0 ml LV vol d, MOD A4C: 158.0 ml LV vol s, MOD A2C: 101.0 ml LV vol s, MOD A4C: 116.0 ml LV SV MOD A2C:     65.0 ml LV SV MOD A4C:     158.0 ml LV SV MOD BP:      54.6 ml RIGHT VENTRICLE RV S prime:     10.30 cm/s TAPSE (M-mode): 1.9 cm LEFT  ATRIUM             Index       RIGHT ATRIUM           Index LA diam:        4.70 cm 2.60 cm/m  RA Area:     12.70 cm LA Vol (A2C):   56.5 ml 31.24 ml/m RA Volume:   29.60 ml  16.37 ml/m LA Vol (A4C):   36.8 ml 20.35 ml/m LA Biplane Vol: 48.1 ml 26.60 ml/m  AORTIC VALVE  LVOT Vmax:   58.00 cm/s LVOT Vmean:  37.700 cm/s LVOT VTI:    0.121 m  AORTA Ao Root diam: 2.40 cm MITRAL VALVE                TRICUSPID VALVE MV Area (PHT): 5.31 cm     TR Peak grad:   34.6 mmHg MV Decel Time: 143 msec     TR Vmax:        294.00 cm/s MV E velocity: 110.00 cm/s MV A velocity: 66.00 cm/s   SHUNTS MV E/A ratio:  1.67         Systemic VTI:  0.12 m                             Systemic Diam: 1.90 cm Charlton Haws MD Electronically signed by Charlton Haws MD Signature Date/Time: 03/12/2019/11:23:45 AM    Final     Time coordinating discharge: Over 30 minutes  SIGNED:   Alessandra Bevels, MD  [facility] 03/25/2019, 3:31 PM

## 2019-03-25 NOTE — Progress Notes (Signed)
Patient was awaken by leg cramping, pain level was a 10/10.  Patient was not able to move her legs for about 10 minutes.  Text [facility] for pain medication to relieve symptoms.   Patient stated that she has not had an episode like this in a long time and usually takes mustard to relieve episodes.   MD was able to place order for pain medication and this was given to patient.

## 2019-03-25 NOTE — Evaluation (Signed)
Physical Therapy Evaluation Patient Details Name: [name] [name] MRN: [id number] DOB: 07/02/1950 Today's Date: 03/25/2019   History of Present Illness  Pt is 69 y.o. female with medical history significant of HFrEF (EF 25%), CAD, PVD, HTN, DM2, HLD, GERD, arthritis who was recently hospitalized for CHF exacerbation, diuresed well and discharged home on 2/15.  She presented with chest pain and found to be in Afib.  Pt converted back to NSR on 2/22 and had short post coversion pause, now NSR.  Pt with elevated troponin and per cardiology demand ischemia - no further testing at this time.  Clinical Impression   Pt admitted with above diagnosis. Pt has necessary DME and support at home in order to return home from PT perspective; however, would benefit from PT while hospitalized to advance mobility.  She ambulated 20' with RW and min guard for safety.  Needed min cues for safety and fatigued easily.  Pt reports that she feels near her baseline. Pt currently with functional limitations due to the deficits listed below (see PT Problem List). Pt will benefit from skilled PT to increase their independence and safety with mobility to allow discharge to the venue listed below.       Follow Up Recommendations Home health PT;Supervision/Assistance - 24 hour    Equipment Recommendations  None recommended by PT    Recommendations for Other Services       Precautions / Restrictions Precautions Precautions: Fall Restrictions Weight Bearing Restrictions: No      Mobility  Bed Mobility               General bed mobility comments: at EOB upon arrival  Transfers Overall transfer level: Needs assistance Equipment used: Rolling walker (2 wheeled) Transfers: Sit to/from Stand Sit to Stand: Min guard         General transfer comment: for safety; performed x 2  Ambulation/Gait Ambulation/Gait assistance: Min guard Gait Distance (Feet): 30 Feet Assistive device: Rolling walker (2  wheeled) Gait Pattern/deviations: Decreased stride length;Wide base of support Gait velocity: decreased   General Gait Details: 23' then 20' after seated rest break.  Noted partial L foot drop (baseline).  Pt reports she feels close to her baseline.  Reports easily fatigues and sits on rollator for breaks.  She was able to navigate RW in tight area.  Stairs            Wheelchair Mobility    Modified Rankin (Stroke Patients Only)       Balance Overall balance assessment: Needs assistance Sitting-balance support: No upper extremity supported;Feet supported Sitting balance-Leahy Scale: Normal     Standing balance support: Bilateral upper extremity supported;During functional activity Standing balance-Leahy Scale: Fair                               Pertinent Vitals/Pain Pain Assessment: No/denies pain    Home Living Family/patient expects to be discharged to:: Private residence Living Arrangements: Children Available Help at Discharge: Family;Available 24 hours/day Type of Home: House Home Access: Stairs to enter Entrance Stairs-Rails: Left Entrance Stairs-Number of Steps: 3 Home Layout: One level Home Equipment: Walker - 2 wheels;Walker - 4 wheels;Cane - single point;Bedside commode;Tub bench      Prior Function Level of Independence: Needs assistance   Gait / Transfers Assistance Needed: Walks with rollator; Could ambulate short distances in community (used motorized cart at store)  ADL's / Homemaking Assistance Needed: Has assist with sponge baths.  Can toilet and dress independently.        Hand Dominance        Extremity/Trunk Assessment   Upper Extremity Assessment Upper Extremity Assessment: RUE deficits/detail;LUE deficits/detail RUE Deficits / Details: ROM WFL and strength 4/5 LUE Deficits / Details: ROM WFL except shoulder elevation to ~90 (baseline); MMT 4/5    Lower Extremity Assessment Lower Extremity Assessment: LLE  deficits/detail;RLE deficits/detail RLE Deficits / Details: ROM WFL; MMT 5/5 LLE Deficits / Details: ROM WFL; MMT 4/5 (increased effort to extend knee ) - report baseline    Cervical / Trunk Assessment Cervical / Trunk Assessment: Kyphotic  Communication   Communication: No difficulties  Cognition Arousal/Alertness: Awake/alert Behavior During Therapy: WFL for tasks assessed/performed Overall Cognitive Status: Within Functional Limits for tasks assessed                                        General Comments General comments (skin integrity, edema, etc.): On RA with O2 sats 98-100%.  HR70-80s bpm    Exercises     Assessment/Plan    PT Assessment Patient needs continued PT services  PT Problem List Decreased strength;Decreased mobility;Decreased range of motion;Decreased coordination;Decreased activity tolerance;Cardiopulmonary status limiting activity;Decreased balance;Decreased knowledge of use of DME       PT Treatment Interventions DME instruction;Therapeutic activities;Gait training;Therapeutic exercise;Patient/family education;Stair training;Balance training;Functional mobility training    PT Goals (Current goals can be found in the Care Plan section)  Acute Rehab PT Goals Patient Stated Goal: return home PT Goal Formulation: With patient Time For Goal Achievement: 04/08/19 Potential to Achieve Goals: Good    Frequency Min 3X/week   Barriers to discharge        Co-evaluation               AM-PAC PT "6 Clicks" Mobility  Outcome Measure Help needed turning from your back to your side while in a flat bed without using bedrails?: None Help needed moving from lying on your back to sitting on the side of a flat bed without using bedrails?: A Little Help needed moving to and from a bed to a chair (including a wheelchair)?: None Help needed standing up from a chair using your arms (e.g., wheelchair or bedside chair)?: None Help needed to walk in  hospital room?: None Help needed climbing 3-5 steps with a railing? : A Little 6 Click Score: 22    End of Session Equipment Utilized During Treatment: Gait belt Activity Tolerance: Patient tolerated treatment well Patient left: in chair;with call bell/phone within reach Nurse Communication: Mobility status PT Visit Diagnosis: Other abnormalities of gait and mobility (R26.89);Muscle weakness (generalized) (M62.81)    Time: 4098-1191 PT Time Calculation (min) (ACUTE ONLY): 24 min   Charges:   PT Evaluation $PT Eval Low Complexity: 1 Low          Maggie Font, PT Acute Rehab Services Pager [phone] [facility] Re[facility] [phone] [facility] [phone]   Karlton Lemon 03/25/2019, 12:28 PM

## 2019-03-25 NOTE — Progress Notes (Addendum)
Progress Note  Patient Name: [name] [name] Age Date of Encounter: 03/25/2019  Primary Cardiologist: Nicki Guadalajara, MD   Subjective   No acute overnight events. Patient is eager to go home. She is hoping nausea will improve now that Imdur has been decreased but has not had morning medications yet. No chest pain. She reports some labored breathing last night but improved this morning.  Inpatient Medications    Scheduled Meds: . apixaban  5 mg Oral BID  . atorvastatin  80 mg Oral QHS  . clopidogrel  75 mg Oral Daily  . docusate sodium  200 mg Oral Daily  . insulin aspart  0-5 Units Subcutaneous QHS  . insulin aspart  0-9 Units Subcutaneous TID WC  . insulin detemir  35 Units Subcutaneous QHS  . isosorbide mononitrate  90 mg Oral Daily  . levalbuterol  1.25 mg Nebulization BID  . meclizine  12.5 mg Oral BID  . metoprolol tartrate  50 mg Oral BID  . pantoprazole  40 mg Oral Daily   Continuous Infusions:  PRN Meds: acetaminophen, albuterol, nitroGLYCERIN, ondansetron (ZOFRAN) IV   Vital Signs    Vitals:   03/24/19 2015 03/24/19 2040 03/24/19 2119 03/25/19 0427  BP: 97/68  (!) 150/68 135/60  Pulse: 72  79 62  Resp: 20   20  Temp: 97.9 F (36.6 C)   97.8 F (36.6 C)  TempSrc: Oral   Oral  SpO2: 97% 98%  94%  Weight:    76.8 kg  Height:        Intake/Output Summary (Last 24 hours) at 03/25/2019 0756 Last data filed at 03/25/2019 0451 Gross per 24 hour  Intake 877 ml  Output 750 ml  Net 127 ml   Last 3 Weights 03/25/2019 03/24/2019 03/23/2019  Weight (lbs) 169 lb 4.8 oz 168 lb 10.4 oz 167 lb 5.3 oz  Weight (kg) 76.794 kg 76.5 kg 75.9 kg      Telemetry    Normal sinus rhythm with rates in the 50's to 70's. - Personally Reviewed  ECG    No new ECG tracing today. - Personally Reviewed  Physical Exam   GEN: No acute distress.   Neck: Supple. Cardiac: RRR. No significant murmur, gallops, or rubs. Respiratory: No increased work of breathing. Mild crackles noted in  bilateral bases (these may be chronic). No wheezes or rhonchi. GI: Soft, non-distended, and non-tender. MS: Mild lower extremity edema.  Skin: Warm and dry. Neuro:  No focal deficits. Psych: Normal affect. Responds appropriately.   Labs    High Sensitivity Troponin:   Recent Labs  Lab 03/11/19 1823 03/11/19 2030 03/12/19 0344 03/20/19 1157 03/20/19 1434  TROPONINIHS 123* 110* 115* 63* 1,069*      Chemistry Recent Labs  Lab 03/20/19 1157 03/21/19 0319 03/23/19 0246 03/24/19 0854 03/25/19 0337  NA 137   < > 138 140 138  K 5.8*   < > 4.8 4.5 5.0  CL 104   < > 105 107 107  CO2 17*   < > 19* 20* 21*  GLUCOSE 229*   < > 105* 126* 74  BUN 50*   < > 66* 68* 65*  CREATININE 1.77*   < > 2.31* 2.04* 1.92*  CALCIUM 9.3   < > 8.9 9.3 8.9  PROT 7.3  --   --  6.6  --   ALBUMIN 3.5  --   --  3.4*  --   AST 20  --   --  41  --  ALT 16  --   --  47*  --   ALKPHOS 101  --   --  135*  --   BILITOT 0.7  --   --  0.9  --   GFRNONAA 29*   < > 21* 24* 26*  GFRAA 34*   < > 24* 28* 30*  ANIONGAP 16*   < > 14 13 10    < > = values in this interval not displayed.     Hematology Recent Labs  Lab 03/21/19 0319 03/22/19 0222 03/24/19 0854  WBC 9.0 8.9 9.2  RBC 3.36* 3.43* 3.50*  HGB 9.2* 9.3* 9.4*  HCT 28.6* 29.1* 29.4*  MCV 85.1 84.8 84.0  MCH 27.4 27.1 26.9  MCHC 32.2 32.0 32.0  RDW 15.2 15.1 15.2  PLT 320 311 338    BNP Recent Labs  Lab 03/20/19 1157 03/23/19 0930  BNP 2,161.2* 2,115.8*     DDimer No results for input(s): DDIMER in the last 168 hours.   Radiology    DG CHEST PORT 1 VIEW  Result Date: 03/23/2019 CLINICAL DATA:  69 year old female with CHF is aspiration. EXAM: PORTABLE CHEST 1 VIEW COMPARISON:  Chest radiograph dated 03/20/2019. FINDINGS: There is mild cardiomegaly with vascular congestion. A small right pleural effusion may be present. No consolidative changes. No pneumothorax. Atherosclerotic calcification of the aorta. No acute osseous pathology.  IMPRESSION: Cardiomegaly with mild vascular congestion and probable small right pleural effusion. Electronically Signed   By: 03/22/2019 M.D.   On: 03/23/2019 17:04    Cardiac Studies   Echocardiogram 03/12/2019: Impressions: 1. Diffuse hypokinesis worse in the septum and inferior base. Left  ventricular ejection fraction, by estimation, is 25 to 30%. The left  ventricle has moderate to severely decreased function. The left ventricle  demonstrates global hypokinesis. The left  ventricular internal cavity size was moderately dilated. Left ventricular  diastolic parameters are consistent with Grade III diastolic dysfunction  (restrictive). Elevated left ventricular end-diastolic pressure.  2. Right ventricular systolic function is normal. The right ventricular  size is normal. There is moderately elevated pulmonary artery systolic  pressure.  3. Left atrial size was moderately dilated.  4. Right atrial size was mildly dilated.  5. The mitral valve is normal in structure and function. Mild mitral  valve regurgitation. No evidence of mitral stenosis.  6. The aortic valve is normal in structure and function. Aortic valve  regurgitation is not visualized. No aortic stenosis is present.  Patient Profile     [name] [name] is a 69 y.o. 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  Assessment & Plan    Paroxysmal Atrial Fibrillation/Flutter - Initially EKG appeared consistent with coarse atrial fibrillation with rate of 125 bpm.  - Patient converted back to normal sinus rhythm on morning of 2/22. She had a short post conversion pause. Maintaining sinus rhuthm on telemetry. No recurrent pauses. - Recent Echo from 03/12/2019 showed LVEF of 25-30%.  - Continue Lopressor 50mg  twice daily. With  reduced EF, consider consolidating to Toprol 100mg  daily. - CHA2DS2-VASc = 8 (CHF, HTN, age >63, DM, stroke x2, CAD/PAD, female). Continue Eliquis 5mg  twice daily.  Demand Ischemia - High-sensitivity troponin elevated at 63 >> 1,069.  - Felt to be consistent with demand ischemia in setting of atrial fibrillation with RVR with  underlying CAD that is not amenable to PCI or surgical revascularization. No further ischemic evaluation planned at this time.  CAD s/p CABG - Patient has known CAD including 80% stenosis of the distal left main. Unfortunately, disease is not felt to be amenable to PCI or CABG. - No angina.  - Continue aggressive medical therapy. Aspirin discontinued due to need for Eliquis. However, will continue Plavix. Continue beta-blocker, Imdur, and high-intensity statin.   Chronic Combined CHF - BNP elevated at 2100. - Most recent Echo from 03/12/2019 showed LVEF of 25-30% with diffuse hypokinesis (worse in the septum and inferior base) and grade 3 diastolic dysfunction. - Net negative 272 mL this admission. Weight up about 5 lbs. She received dose of IV Lasix on 2/22 but had worsening renal function with this. Nephrology following. Will defer additional Lasix to Nephrology. Per their note yesterday, can likely restart PO Lasix today. - No ACE/ARB/ARNI or Spironolactone at this time due to renal function.  - Continue beta-blocker as above. - Continue to monitor daily weights, strict I/O's, and renal function.   PAD - History of femoropopliteal bypass. Dopplers in 09/2017 showed patent grafts. - Continue Plavix and high-intensity statin. Aspirin stopped due to need for Eliquis.  Hypertension - BP has been soft at times but is currently stable. - Continue Lopressor as above.   Hyperlipidemia - Continue Lipitor 80mg  daily.  AKI - Creatinine 1.77 on admission and peaked at 2.31 on 2/23. Recent baseline appears to be around 1.1 to 1.5.  - Avoid Nephrotoxic agents.  -  Continue daily BMET.  - Nephrology following.   Hyperkalemia - Potassium 5.8 on 2/20. Received Lokelma in the ED. - Potassium 5.0 toady. - Continue to monitor.   Anemia - Hemoglobin stable at 9.4 yesterday. Baseline around 8 to 10 range.  - Continue to monitor for signs of bleeding with addition of Eliquis.  Nausea - Imdur decreased from 120mg  to 90mg  yesterday to see if this will help with patient's nausea. However, she has not had morning medications yet so do not know if this has helped.   Per Dr. Marlou Porch, [place] for discharge from a cardiac standpoint. Follow-up appointment has been arranged for 04/01/2019.   For questions or updates, please contact [facility] Please consult [email or web address] for contact info under        Signed, Darreld Mclean, PA-C  03/25/2019, 7:56 AM    Personally seen and examined. Agree with above.   Currently no nausea or dizziness this morning.  No chest pain, no shortness of breath.  She really desires to go home. She wonders if after taking her pills this morning if she will become nauseous again. I went ahead and decreased her isosorbide from 120 down to 90 yesterday to see if this would help.  Eliquis should not cause nausea.  She also states that she usually does not take all of her pills at once at home.  Perhaps spacing them out when she is at home will help. -I think it is reasonable as Dr. Joelyn Oms said to start her Lasix 40 mg once a day on discharge.  Creatinine 1.92 this morning, slightly down.  She is alert and oriented x3, continues with right-sided lower crackles, likely scarring and chronic, minimal edema comfortable in bed.  Assessment and plan:  Severe coronary artery disease, left main disease not amenable to PCI or CABG  Ischemic cardiomyopathy chronic systolic heart failure -We have reduced her isosorbide from 120 down to 90 hopefully this will help  with nausea.  Paroxysmal atrial fibrillation-new start  Eliquis during this  hospitalization, currently in sinus rhythm  Acute kidney injury-appreciate nephrology assistance.  Okay to resume Lasix 40 mg p.o. once a day.  I am fine from a cardiology perspective for her to be discharged.  We have set up close follow-up for her.  Donato Schultz, MD

## 2019-03-29 NOTE — Progress Notes (Deleted)
Cardiology Office Note:    Date:  03/29/2019   ID:  [name] [name], DOB 04/16/1950, MRN [id number]  PCP:  [name] Low, MD  Cardiologist:  Shelva Majestic, MD  Electrophysiologist:  None   Referring MD: [name] Low, MD   Chief Complaint: hospital follow up for atrial fibrillation  History of Present Illness:    [name] [name] is a 69 y.o. 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 Claiborne [name] and presents for hospital follow-up.  Patient has a long history of CAD with STEMI in 2011 at which time underwent successful PCI/DES to totally occluded LCX. She was also noted to have diffuse disease in the LCX beyond the site of the total occlusion and later underwent PTCA of the smaller vessels. She also has a history of PAD and underwent angioplasty and stenting with diamondback rotation atherectomy of right popliteal and SGA in 01/2010. She required repeat intervention in 2013 She then underwent left femoral to below-knee popliteal artery bypass in 10/2015. She also has known carotid stenosis with known total right ICA occlusion. She was admitted in 04/2018 with shortness of breath that woke her up form sleep as well as right arm pain which has been her anginal equivalent in the past. Troponin was elevated. Echocardiogram showed LVEF of 45%. Cardiac catheterization showed 85% ostial left circumflex disease, 80% mid left main disease, 50% mid left circumflex disease, 30% proximal LAD disease.  RCA was normal however is a small nondominant vessel. CT surgery was consulted due to two-vessel disease. She was evaluated by Dr. Cyndia Bent; however, she was considered high risk and medical therapy was recommended.  She was recently admitted from 03/20/2019 to  03/25/2019 for new onset atrial fibrillation with RVR after presenting with chest pain with radiation to arms and associated nausea. Cardiology was consulted. She was initially treated with IV Cardizem but was then transitioned to Lopressor. Echo during this admission showed LVEF of 25-30% Echo showed LVEF of 25-30% with global hypokinesis, grade 3 diastolic dysfunction, elevated LVEDP, and moderately elevated PASP. High-sensitivity troponin peaked at 1,069; however, this was felt to be due to demand ischemia secondary to atrial fibrillation with RVR in the setting of known CAD. Eliquis was started on Aspirin was discontinued. Patient initially was diuresed with IV Lasix but creatinine rose to 2.31. Therefore, Nephrology was consulted to assist with diuresis. Nephrology recommended discharging patient on Lasix 40mg  daily. Discharge weight 169 lbs. Creatinine 1.92 on day of discharge. Patient was also noted to have dizziness and nausea during admission. Imdur was initially increased to 120mg  daily but it was decreased back down to home dose of 90mg  daily as it was thought this may be contributing to nausea.    Patient presents today for hospital follow-up. ***  Past Medical History:  Diagnosis Date   Anemia 10/2015   Acute Blood Loss   Arthritis    "feel like I have it all over" (08/28/2015)   CHF (congestive heart failure) (HCC)    Complication of anesthesia    DIFFICULT WAKING "only when I was smoking; no problems since I quit"   Coronary artery disease    Family history of adverse reaction to anesthesia    sister slow to wake up   GERD (gastroesophageal reflux disease)  takes Protonix daily    Hip bursitis    History of blood transfusion    10/2015   Hyperlipidemia LDL goal < 70 06/28/2013   takes Atorvastatin daily   Hypertension    takes Metoprolol and Imdur daily   Hypoxia 01/2018   Malnutrition (HCC)    Migraine    "none in a long time" (08/28/2015)   Myocardial  infarction [facility]) 2011   Neuromuscular disorder (HCC)    DIABETIC NEUROPATHY   Osteomyelitis (HCC) 2017   Left foot   PAD (peripheral artery disease) (HCC)    Peripheral vascular disease (HCC)    Respiratory failure (HCC) 10/2015   Acute Hypoxia- acute pulmonary edema 11/13/2015   Septic shock (HCC) 10/2015   Stroke (HCC)    Type II diabetes mellitus (HCC)    takes Lantus nightly.Average fasting blood sugar runs 80-90  Type II    Past Surgical History:  Procedure Laterality Date   ABDOMINAL HYSTERECTOMY     AMPUTATION Right 10/22/2016   Procedure: AMPUTATION DIGIT RIGHT TOES 1-3 POSSIBLE TRANSMETATARSAL;  Surgeon: Chuck Hint, MD;  Location: [facility] OR;  Service: Vascular;  Laterality: Right;   APPENDECTOMY     ATHERECTOMY N/A 06/04/2011   Procedure: ATHERECTOMY;  Surgeon: Runell Gess, MD;  Location: [facility] CATH LAB;  Service: Cardiovascular;  Laterality: N/A;   CARDIAC CATHETERIZATION  10/13/2009   95% stenosis in the AV groove circumflex and 95% ostial stenosis in small OM3. A 3x48mm drug-eluting Promus stent inserted ito the circumflex. Dilatated with a 3.25x29mm noncompliant Quantum balloon within entire segment. The entire region was reduced to 0% and brisk TIMI3 flow.   CAROTID DUPLEX  03/19/2011   Right ICA-demonstrates complete occlusion. Left ICA-demonstrates a small amount of fibrous plaque.   CATARACT EXTRACTION W/ INTRAOCULAR LENS IMPLANT Right    CESAREAN SECTION  1990   CORONARY ANGIOPLASTY     ENDARTERECTOMY FEMORAL Left 09/05/2015   Procedure: ENDARTERECTOMY FEMORAL WITH PROFUNDOPLASTY;  Surgeon: Chuck Hint, MD;  Location: [facility] OR;  Service: Vascular;  Laterality: Left;  Left common femoral artery vein patch using left saphenous vien   ENDARTERECTOMY FEMORAL Right 10/22/2016   Procedure: ENDARTERECTOMY RIGHT COMMON FEMORAL;  Surgeon: Chuck Hint, MD;  Location: [facility] OR;  Service: Vascular;  Laterality: Right;   FEMORAL-POPLITEAL  BYPASS GRAFT Left 11/02/2015   Procedure: BYPASS GRAFT FEMORAL-POPLITEAL ARTERY VS FEMORAL-TIBIAL ARTERY BYPASS;  Surgeon: Chuck Hint, MD;  Location: [facility] OR;  Service: Vascular;  Laterality: Left;   FEMORAL-POPLITEAL BYPASS GRAFT Right 10/22/2016   Procedure: BYPASS GRAFT RIGHT FEMORAL- BELOW KNEE POPLITEAL ARTERY WITH VEIN;  Surgeon: Chuck Hint, MD;  Location: [facility] OR;  Service: Vascular;  Laterality: Right;   I & D EXTREMITY Left 11/10/2015   Procedure: Debridement Left Foot Ulcer, Application  Wound VAC;  Surgeon: [name], MD;  Location: [facility] OR;  Service: Orthopedics;  Laterality: Left;   ILIAC ARTERY STENT Left 08/28/2015   common   INTRAOPERATIVE ARTERIOGRAM Left 09/05/2015   Procedure: INTRA OPERATIVE ARTERIOGRAM;  Surgeon: Chuck Hint, MD;  Location: [facility] OR;  Service: Vascular;  Laterality: Left;   INTRAOPERATIVE ARTERIOGRAM Left 11/02/2015   Procedure: INTRA OPERATIVE ARTERIOGRAM;  Surgeon: Chuck Hint, MD;  Location: [facility] OR;  Service: Vascular;  Laterality: Left;   INTRAOPERATIVE ARTERIOGRAM Right 10/22/2016   Procedure: INTRA OPERATIVE ARTERIOGRAM;  Surgeon: Chuck Hint, MD;  Location: [facility] OR;  Service: Vascular;  Laterality: Right;   LEFT HEART CATH AND CORONARY ANGIOGRAPHY  N/A 05/11/2018   Procedure: LEFT HEART CATH AND CORONARY ANGIOGRAPHY;  Surgeon: Lennette Bihari, MD;  Location: [facility] INVASIVE CV LAB;  Service: Cardiovascular;  Laterality: N/A;   LEXISCAN MYOVIEW  10/25/2010   Moderate perfusion defect due to infarct/scar with mild perinfarct ischemia seen in the Basal Inferolateral, Basal Anterolateral, Mid Inferolateral, and Mid Anterolateral regions. Post-stress EF is 50%.   LOWER EXTREMITY ANGIOGRAPHY N/A 10/17/2016   Procedure: Lower Extremity Angiography;  Surgeon: Runell Gess, MD;  Location: [facility] INVASIVE CV LAB;  Service: Cardiovascular;  Laterality: N/A;   OVARY SURGERY  1983?   "ruptured"   PERIPHERAL VASCULAR  ANGIOGRAM  01/26/2010   High-grade SFA disease: left greater than right. Left SFA would require fem-pop bypass grafting. Right SFA could be stented but might require Diamondback Orbital atherectomy.   PERIPHERAL VASCULAR ANGIOGRAM  02/23/2010   Stealth Predator orbital rotational atherectomy performed on SFA & Popliteal up to 90,000 RPM. Stenting using overlapping 5x12mm and 5x32mm Absolute Pro Nitinol self-expanding stents beginning just at the knee up to the mid SFA resulting in reduction of 90-95% calcified SFA & Popliteal stenosis to 0. Stenting performed on the distal common & proximal iliac artery with a 10x4 Absolute Pro- 70-0%.   PERIPHERAL VASCULAR ANGIOGRAM  06/17/2010   PTA performed to the right external iliac artery stent using a 5x100 balloon at 10 atmospheres. Stenting performed using a 6x18 Genesis on Opta balloon. Postdilatation with a 7x2 balloon resulting in a 95% "in-stent" stenosis to 0% residual.   PERIPHERAL VASCULAR ANGIOGRAM  06/04/2011   Bilateral total SFAs not percutaneously addressable. Good canidate for femoropopliteal bypass grafting   PERIPHERAL VASCULAR ANGIOGRAM  08/28/2015   PERIPHERAL VASCULAR CATHETERIZATION N/A 08/28/2015   Procedure: Lower Extremity Angiography;  Surgeon: Runell Gess, MD;  Location: [facility] INVASIVE CV LAB;  Service: Cardiovascular;  Laterality: N/A;   PERIPHERAL VASCULAR CATHETERIZATION N/A 08/28/2015   Procedure: Abdominal Aortogram;  Surgeon: Runell Gess, MD;  Location: [facility] INVASIVE CV LAB;  Service: Cardiovascular;  Laterality: N/A;   PERIPHERAL VASCULAR CATHETERIZATION Left 08/28/2015   Procedure: Peripheral Vascular Intervention;  Surgeon: Runell Gess, MD;  Location: [facility] INVASIVE CV LAB;  Service: Cardiovascular;  Laterality: Left;  common iliac   PERIPHERAL VASCULAR CATHETERIZATION Left 08/28/2015   Procedure: Peripheral Vascular Atherectomy;  Surgeon: Runell Gess, MD;  Location: [facility] INVASIVE CV LAB;  Service: Cardiovascular;   Laterality: Left;  common iliac   PERIPHERAL VASCULAR CATHETERIZATION N/A 09/28/2015   Procedure: Lower Extremity Angiography;  Surgeon: Runell Gess, MD;  Location: [facility] INVASIVE CV LAB;  Service: Cardiovascular;  Laterality: N/A;   PERIPHERAL VASCULAR CATHETERIZATION Left 09/28/2015   Procedure: Peripheral Vascular Intervention;  Surgeon: Runell Gess, MD;  Location: [facility] INVASIVE CV LAB;  Service: Cardiovascular;  Laterality: Left CFA  PCI with 9 mm x 4 cm Abbott nitinol absolute Pro self-expanding stent      SKIN SPLIT GRAFT Left 12/01/2015   Procedure: LEFT FOOT SKIN GRAFT AND VAC;  Surgeon: [name], MD;  Location: [facility] OR;  Service: Orthopedics;  Laterality: Left;   THROMBECTOMY FEMORAL ARTERY Right 10/22/2016   Procedure: THROMBECTOMY FEMORAL ARTERY;  Surgeon: Chuck Hint, MD;  Location: [facility] OR;  Service: Vascular;  Laterality: Right;   TRANSTHORACIC ECHOCARDIOGRAM  10/17/2009   EF 45-50%, moderate hypokinesis of the entire inferolateral myocardium, mild concentric hypertrophy and mild regurg of the mitral valva.   VEIN HARVEST Left 11/02/2015   Procedure: LEFT GREATER SAPHENOUS VEIN HARVEST;  Surgeon: Chuck Hint, MD;  Location: [facility] OR;  Service: Vascular;  Laterality: Left;    Current Medications: No outpatient medications have been marked as taking for the 04/01/19 encounter (Appointment) with Corrin Parker, PA-C.     Allergies:   Doxycycline, Hydrochlorothiazide, Latex, and Penicillins   Social History   Socioeconomic History   Marital status: Legally Separated    Spouse name: Not on file   Number of children: Not on file   Years of education: Not on file   Highest education level: Not on file  Occupational History   Occupation: retired Occupational hygienist  Tobacco Use   Smoking status: Former Smoker    Packs/day: 1.50    Years: 41.00    Pack years: 61.50    Types: Cigarettes    Quit date: 10/12/2009    Years since quitting: 9.4    Smokeless tobacco: Never Used  Substance and Sexual Activity   Alcohol use: No   Drug use: No   Sexual activity: Not Currently    Birth control/protection: Surgical  Other Topics Concern   Not on file  Social History Narrative   Admitted to [facility]   Former Smoker - stopped 2011   Alcohol  None   Full code   Social Determinants of Health   Financial Resource Strain:    Difficulty of Paying Living Expenses: Not on file  Food Insecurity:    Worried About Programme researcher, broadcasting/film/video in the Last Year: Not on file   [employer] of Food in the Last Year: Not on file  Transportation Needs:    Lack of Transportation (Medical): Not on file   Lack of Transportation (Non-Medical): Not on file  Physical Activity:    Days of Exercise per Week: Not on file   Minutes of Exercise per Session: Not on file  Stress:    Feeling of Stress : Not on file  Social Connections:    Frequency of Communication with Friends and Family: Not on file   Frequency of Social Gatherings with Friends and Family: Not on file   Attends Religious Services: Not on file   Active Member of Clubs or Organizations: Not on file   Attends Banker Meetings: Not on file   Marital Status: Not on file     Family History: The patient's ***family history includes Diabetes in her father; Heart failure in her father and mother; Hypertension in her mother; Stroke in her father.  ROS:   Please see the history of present illness.    *** All other systems reviewed and are negative.  EKGs/Labs/Other Studies Reviewed:    The following studies were reviewed today:  Left Heart Catheterization 05/11/2018:  Ost Cx to Prox Cx lesion is 85% stenosed.  Mid LM to Dist LM lesion is 80% stenosed.  Mid Cx to Dist Cx lesion is 50% stenosed.  Mid Cx lesion is 40% stenosed.  Prox LAD to Mid LAD lesion is 30% stenosed.  LV end diastolic pressure is moderately elevated.  There is mild to  moderate left ventricular systolic dysfunction.   Significant coronary calcification with eccentric 80% mid distal left main stenosis, mild diffuse irregularity of the LAD with 30% stenoses, and a large dominant left calcified left circumflex coronary artery with previously placed stent in the proximal circumflex with focal mid instent 85% stenosis, as well as segmental 40 and 50% mid AV groove stenoses.  The RCA is a small nondominant normal vessel.  Moderate LV dysfunction with an EF estimated at 40 to 45% with hypocontractility involving the mid anterolateral wall and basal mid inferior wall.  LVEDP elevated at 28 mmHg.  Recommendation: Surgical consultation for CABG revascularization. _______________  Echocardiogram 03/12/2019: Impressions: 1. Diffuse hypokinesis worse in the septum and inferior base. Left  ventricular ejection fraction, by estimation, is 25 to 30%. The left  ventricle has moderate to severely decreased function. The left ventricle  demonstrates global hypokinesis. The left  ventricular internal cavity size was moderately dilated. Left ventricular  diastolic parameters are consistent with Grade III diastolic dysfunction  (restrictive). Elevated left ventricular end-diastolic pressure.  2. Right ventricular systolic function is normal. The right ventricular  size is normal. There is moderately elevated pulmonary artery systolic  pressure.  3. Left atrial size was moderately dilated.  4. Right atrial size was mildly dilated.  5. The mitral valve is normal in structure and function. Mild mitral  valve regurgitation. No evidence of mitral stenosis.  6. The aortic valve is normal in structure and function. Aortic valve  regurgitation is not visualized. No aortic stenosis is present.   EKG:  EKG *** ordered today. EKG personally reviewed and demonstrates ***.  Recent Labs: 03/12/2019: Magnesium 1.6 03/23/2019: B Natriuretic Peptide 2,115.8; TSH 1.737 03/24/2019:  ALT 47; Hemoglobin 9.4; Platelets 338 03/25/2019: BUN 65; Creatinine, Ser 1.92; Potassium 5.0; Sodium 138  Recent Lipid Panel    Component Value Date/Time   CHOL 162 03/12/2019 0344   TRIG 176 (H) 03/12/2019 0344   HDL 26 (L) 03/12/2019 0344   CHOLHDL 6.2 03/12/2019 0344   VLDL 35 03/12/2019 0344   LDLCALC 101 (H) 03/12/2019 0344    Physical Exam:    Vital Signs: There were no vitals taken for this visit.    Wt Readings from Last 3 Encounters:  03/25/19 169 lb 4.8 oz (76.8 kg)  03/15/19 164 lb 10.9 oz (74.7 kg)  05/12/18 185 lb 13.6 oz (84.3 kg)     General: 69 y.o. female in no acute distress. HEENT: Normocephalic and atraumatic. Sclera clear. EOMs intact. Neck: Supple. No carotid bruits. No JVD. Heart: *** RRR. Distinct S1 and S2. No murmurs, gallops, or rubs. Radial and distal pedal pulses 2+ and equal bilaterally. Lungs: No increased work of breathing. Clear to ausculation bilaterally. No wheezes, rhonchi, or rales.  Abdomen: Soft, non-distended, and non-tender to palpation. Bowel sounds present in all 4 quadrants.  MSK: Normal strength and tone for age. *** Extremities: No lower extremity edema.    Skin: Warm and dry. Neuro: Alert and oriented x3. No focal deficits. Psych: Normal affect. Responds appropriately.   Assessment:    No diagnosis found.  Plan:     Disposition: Follow up in ***   Medication Adjustments/Labs and Tests Ordered: Current medicines are reviewed at length with the patient today.  Concerns regarding medicines are outlined above.  No orders of the defined types were placed in this encounter.  No orders of the defined types were placed in this encounter.   There are no Patient Instructions on file for this visit.   Signed, Corrin Parker, PA-C  03/29/2019 12:12 PM    [facility]

## 2019-03-31 DIAGNOSIS — E1140 Type 2 diabetes mellitus with diabetic neuropathy, unspecified: Secondary | ICD-10-CM | POA: Diagnosis not present

## 2019-03-31 DIAGNOSIS — I509 Heart failure, unspecified: Secondary | ICD-10-CM | POA: Diagnosis not present

## 2019-03-31 DIAGNOSIS — D6869 Other thrombophilia: Secondary | ICD-10-CM | POA: Diagnosis not present

## 2019-03-31 DIAGNOSIS — I4891 Unspecified atrial fibrillation: Secondary | ICD-10-CM | POA: Diagnosis not present

## 2019-03-31 DIAGNOSIS — N1831 Chronic kidney disease, stage 3a: Secondary | ICD-10-CM | POA: Diagnosis not present

## 2019-03-31 DIAGNOSIS — N179 Acute kidney failure, unspecified: Secondary | ICD-10-CM | POA: Diagnosis not present

## 2019-04-01 ENCOUNTER — Ambulatory Visit: Payer: Medicare Other | Admitting: Physician Assistant

## 2019-04-01 ENCOUNTER — Ambulatory Visit: Payer: Medicare Other | Admitting: Student

## 2019-04-12 DIAGNOSIS — R809 Proteinuria, unspecified: Secondary | ICD-10-CM | POA: Diagnosis not present

## 2019-04-12 DIAGNOSIS — I480 Paroxysmal atrial fibrillation: Secondary | ICD-10-CM | POA: Diagnosis not present

## 2019-04-12 DIAGNOSIS — N179 Acute kidney failure, unspecified: Secondary | ICD-10-CM | POA: Diagnosis not present

## 2019-04-12 DIAGNOSIS — I129 Hypertensive chronic kidney disease with stage 1 through stage 4 chronic kidney disease, or unspecified chronic kidney disease: Secondary | ICD-10-CM | POA: Diagnosis not present

## 2019-04-12 DIAGNOSIS — D649 Anemia, unspecified: Secondary | ICD-10-CM | POA: Diagnosis not present

## 2019-04-12 DIAGNOSIS — N1832 Chronic kidney disease, stage 3b: Secondary | ICD-10-CM | POA: Diagnosis not present

## 2019-04-12 DIAGNOSIS — I5043 Acute on chronic combined systolic (congestive) and diastolic (congestive) heart failure: Secondary | ICD-10-CM | POA: Diagnosis not present

## 2019-04-21 DIAGNOSIS — N1832 Chronic kidney disease, stage 3b: Secondary | ICD-10-CM | POA: Diagnosis not present

## 2019-04-23 DIAGNOSIS — I10 Essential (primary) hypertension: Secondary | ICD-10-CM | POA: Diagnosis not present

## 2019-04-23 DIAGNOSIS — I509 Heart failure, unspecified: Secondary | ICD-10-CM | POA: Diagnosis not present

## 2019-04-23 DIAGNOSIS — I252 Old myocardial infarction: Secondary | ICD-10-CM | POA: Diagnosis not present

## 2019-04-23 DIAGNOSIS — E1140 Type 2 diabetes mellitus with diabetic neuropathy, unspecified: Secondary | ICD-10-CM | POA: Diagnosis not present

## 2019-04-23 DIAGNOSIS — E782 Mixed hyperlipidemia: Secondary | ICD-10-CM | POA: Diagnosis not present

## 2019-04-23 DIAGNOSIS — N183 Chronic kidney disease, stage 3 unspecified: Secondary | ICD-10-CM | POA: Diagnosis not present

## 2019-04-23 DIAGNOSIS — I4891 Unspecified atrial fibrillation: Secondary | ICD-10-CM | POA: Diagnosis not present

## 2019-04-23 DIAGNOSIS — J449 Chronic obstructive pulmonary disease, unspecified: Secondary | ICD-10-CM | POA: Diagnosis not present

## 2019-04-23 DIAGNOSIS — N1831 Chronic kidney disease, stage 3a: Secondary | ICD-10-CM | POA: Diagnosis not present

## 2019-04-23 DIAGNOSIS — I2510 Atherosclerotic heart disease of native coronary artery without angina pectoris: Secondary | ICD-10-CM | POA: Diagnosis not present

## 2019-04-23 DIAGNOSIS — E1159 Type 2 diabetes mellitus with other circulatory complications: Secondary | ICD-10-CM | POA: Diagnosis not present

## 2019-04-24 ENCOUNTER — Encounter (HOSPITAL_COMMUNITY): Payer: Self-pay | Admitting: Emergency Medicine

## 2019-04-24 ENCOUNTER — Emergency Department (HOSPITAL_COMMUNITY): Payer: Medicare Other

## 2019-04-24 ENCOUNTER — Inpatient Hospital Stay (HOSPITAL_COMMUNITY)
Admission: EM | Admit: 2019-04-24 | Discharge: 2019-04-27 | DRG: 291 | Disposition: A | Discharge status: Home / Self Care | Payer: Medicare Other | Attending: Internal Medicine | Admitting: Internal Medicine

## 2019-04-24 ENCOUNTER — Other Ambulatory Visit: Payer: Self-pay

## 2019-04-24 DIAGNOSIS — E1151 Type 2 diabetes mellitus with diabetic peripheral angiopathy without gangrene: Secondary | ICD-10-CM | POA: Diagnosis present

## 2019-04-24 DIAGNOSIS — I739 Peripheral vascular disease, unspecified: Secondary | ICD-10-CM

## 2019-04-24 DIAGNOSIS — Z823 Family history of stroke: Secondary | ICD-10-CM

## 2019-04-24 DIAGNOSIS — R7989 Other specified abnormal findings of blood chemistry: Secondary | ICD-10-CM

## 2019-04-24 DIAGNOSIS — L03116 Cellulitis of left lower limb: Secondary | ICD-10-CM

## 2019-04-24 DIAGNOSIS — Z87891 Personal history of nicotine dependence: Secondary | ICD-10-CM

## 2019-04-24 DIAGNOSIS — L03115 Cellulitis of right lower limb: Secondary | ICD-10-CM | POA: Diagnosis present

## 2019-04-24 DIAGNOSIS — K219 Gastro-esophageal reflux disease without esophagitis: Secondary | ICD-10-CM | POA: Diagnosis not present

## 2019-04-24 DIAGNOSIS — E1142 Type 2 diabetes mellitus with diabetic polyneuropathy: Secondary | ICD-10-CM | POA: Diagnosis not present

## 2019-04-24 DIAGNOSIS — I639 Cerebral infarction, unspecified: Secondary | ICD-10-CM

## 2019-04-24 DIAGNOSIS — I482 Chronic atrial fibrillation, unspecified: Secondary | ICD-10-CM | POA: Diagnosis present

## 2019-04-24 DIAGNOSIS — I480 Paroxysmal atrial fibrillation: Secondary | ICD-10-CM | POA: Diagnosis present

## 2019-04-24 DIAGNOSIS — M79605 Pain in left leg: Secondary | ICD-10-CM | POA: Diagnosis not present

## 2019-04-24 DIAGNOSIS — L0390 Cellulitis, unspecified: Secondary | ICD-10-CM | POA: Diagnosis present

## 2019-04-24 DIAGNOSIS — N183 Chronic kidney disease, stage 3 unspecified: Secondary | ICD-10-CM | POA: Diagnosis not present

## 2019-04-24 DIAGNOSIS — Z7902 Long term (current) use of antithrombotics/antiplatelets: Secondary | ICD-10-CM

## 2019-04-24 DIAGNOSIS — I10 Essential (primary) hypertension: Secondary | ICD-10-CM

## 2019-04-24 DIAGNOSIS — I130 Hypertensive heart and chronic kidney disease with heart failure and stage 1 through stage 4 chronic kidney disease, or unspecified chronic kidney disease: Principal | ICD-10-CM | POA: Diagnosis present

## 2019-04-24 DIAGNOSIS — I5023 Acute on chronic systolic (congestive) heart failure: Secondary | ICD-10-CM | POA: Diagnosis not present

## 2019-04-24 DIAGNOSIS — I5043 Acute on chronic combined systolic (congestive) and diastolic (congestive) heart failure: Secondary | ICD-10-CM | POA: Diagnosis present

## 2019-04-24 DIAGNOSIS — E119 Type 2 diabetes mellitus without complications: Secondary | ICD-10-CM

## 2019-04-24 DIAGNOSIS — Z7901 Long term (current) use of anticoagulants: Secondary | ICD-10-CM | POA: Diagnosis not present

## 2019-04-24 DIAGNOSIS — Z881 Allergy status to other antibiotic agents status: Secondary | ICD-10-CM

## 2019-04-24 DIAGNOSIS — R600 Localized edema: Secondary | ICD-10-CM | POA: Diagnosis not present

## 2019-04-24 DIAGNOSIS — E669 Obesity, unspecified: Secondary | ICD-10-CM | POA: Diagnosis present

## 2019-04-24 DIAGNOSIS — Z79899 Other long term (current) drug therapy: Secondary | ICD-10-CM

## 2019-04-24 DIAGNOSIS — Z95820 Peripheral vascular angioplasty status with implants and grafts: Secondary | ICD-10-CM

## 2019-04-24 DIAGNOSIS — E1140 Type 2 diabetes mellitus with diabetic neuropathy, unspecified: Secondary | ICD-10-CM | POA: Diagnosis present

## 2019-04-24 DIAGNOSIS — Z888 Allergy status to other drugs, medicaments and biological substances status: Secondary | ICD-10-CM

## 2019-04-24 DIAGNOSIS — E1165 Type 2 diabetes mellitus with hyperglycemia: Secondary | ICD-10-CM | POA: Diagnosis not present

## 2019-04-24 DIAGNOSIS — K0889 Other specified disorders of teeth and supporting structures: Secondary | ICD-10-CM | POA: Diagnosis present

## 2019-04-24 DIAGNOSIS — E785 Hyperlipidemia, unspecified: Secondary | ICD-10-CM

## 2019-04-24 DIAGNOSIS — N179 Acute kidney failure, unspecified: Secondary | ICD-10-CM | POA: Diagnosis present

## 2019-04-24 DIAGNOSIS — Z20822 Contact with and (suspected) exposure to covid-19: Secondary | ICD-10-CM | POA: Diagnosis present

## 2019-04-24 DIAGNOSIS — I509 Heart failure, unspecified: Secondary | ICD-10-CM | POA: Diagnosis not present

## 2019-04-24 DIAGNOSIS — N1831 Chronic kidney disease, stage 3a: Secondary | ICD-10-CM | POA: Diagnosis present

## 2019-04-24 DIAGNOSIS — J449 Chronic obstructive pulmonary disease, unspecified: Secondary | ICD-10-CM | POA: Diagnosis present

## 2019-04-24 DIAGNOSIS — Z8619 Personal history of other infectious and parasitic diseases: Secondary | ICD-10-CM

## 2019-04-24 DIAGNOSIS — E875 Hyperkalemia: Secondary | ICD-10-CM | POA: Diagnosis present

## 2019-04-24 DIAGNOSIS — I2510 Atherosclerotic heart disease of native coronary artery without angina pectoris: Secondary | ICD-10-CM | POA: Diagnosis not present

## 2019-04-24 DIAGNOSIS — IMO0002 Reserved for concepts with insufficient information to code with codable children: Secondary | ICD-10-CM | POA: Diagnosis present

## 2019-04-24 DIAGNOSIS — Z833 Family history of diabetes mellitus: Secondary | ICD-10-CM

## 2019-04-24 DIAGNOSIS — Z6830 Body mass index (BMI) 30.0-30.9, adult: Secondary | ICD-10-CM

## 2019-04-24 DIAGNOSIS — E1122 Type 2 diabetes mellitus with diabetic chronic kidney disease: Secondary | ICD-10-CM | POA: Diagnosis present

## 2019-04-24 DIAGNOSIS — Z9861 Coronary angioplasty status: Secondary | ICD-10-CM

## 2019-04-24 DIAGNOSIS — Z91040 Latex allergy status: Secondary | ICD-10-CM

## 2019-04-24 DIAGNOSIS — Z9841 Cataract extraction status, right eye: Secondary | ICD-10-CM

## 2019-04-24 DIAGNOSIS — Z90710 Acquired absence of both cervix and uterus: Secondary | ICD-10-CM

## 2019-04-24 DIAGNOSIS — Z9049 Acquired absence of other specified parts of digestive tract: Secondary | ICD-10-CM

## 2019-04-24 DIAGNOSIS — Z880 Allergy status to penicillin: Secondary | ICD-10-CM

## 2019-04-24 DIAGNOSIS — Z89431 Acquired absence of right foot: Secondary | ICD-10-CM

## 2019-04-24 DIAGNOSIS — I252 Old myocardial infarction: Secondary | ICD-10-CM | POA: Diagnosis not present

## 2019-04-24 DIAGNOSIS — Z961 Presence of intraocular lens: Secondary | ICD-10-CM | POA: Diagnosis present

## 2019-04-24 DIAGNOSIS — Z8249 Family history of ischemic heart disease and other diseases of the circulatory system: Secondary | ICD-10-CM

## 2019-04-24 DIAGNOSIS — Z794 Long term (current) use of insulin: Secondary | ICD-10-CM

## 2019-04-24 DIAGNOSIS — N1832 Chronic kidney disease, stage 3b: Secondary | ICD-10-CM | POA: Diagnosis present

## 2019-04-24 DIAGNOSIS — Z8673 Personal history of transient ischemic attack (TIA), and cerebral infarction without residual deficits: Secondary | ICD-10-CM

## 2019-04-24 DIAGNOSIS — N189 Chronic kidney disease, unspecified: Secondary | ICD-10-CM | POA: Diagnosis not present

## 2019-04-24 DIAGNOSIS — J90 Pleural effusion, not elsewhere classified: Secondary | ICD-10-CM | POA: Diagnosis not present

## 2019-04-24 LAB — CBC
HCT: 31.4 % — ABNORMAL LOW (ref 36.0–46.0)
Hemoglobin: 9.6 g/dL — ABNORMAL LOW (ref 12.0–15.0)
MCH: 23.9 pg — ABNORMAL LOW (ref 26.0–34.0)
MCHC: 30.6 g/dL (ref 30.0–36.0)
MCV: 78.3 fL — ABNORMAL LOW (ref 80.0–100.0)
Platelets: 364 10*3/uL (ref 150–400)
RBC: 4.01 MIL/uL (ref 3.87–5.11)
RDW: 17.2 % — ABNORMAL HIGH (ref 11.5–15.5)
WBC: 8.9 10*3/uL (ref 4.0–10.5)
nRBC: 0 % (ref 0.0–0.2)

## 2019-04-24 LAB — BASIC METABOLIC PANEL
Anion gap: 12 (ref 5–15)
BUN: 51 mg/dL — ABNORMAL HIGH (ref 8–23)
CO2: 21 mmol/L — ABNORMAL LOW (ref 22–32)
Calcium: 8.9 mg/dL (ref 8.9–10.3)
Chloride: 106 mmol/L (ref 98–111)
Creatinine, Ser: 2.2 mg/dL — ABNORMAL HIGH (ref 0.44–1.00)
GFR calc Af Amer: 26 mL/min — ABNORMAL LOW (ref >60)
GFR calc non Af Amer: 22 mL/min — ABNORMAL LOW (ref >60)
Glucose, Bld: 126 mg/dL — ABNORMAL HIGH (ref 70–99)
Potassium: 5.2 mmol/L — ABNORMAL HIGH (ref 3.5–5.1)
Sodium: 139 mmol/L (ref 135–145)

## 2019-04-24 LAB — MAGNESIUM: Magnesium: 1.5 mg/dL — ABNORMAL LOW (ref 1.7–2.4)

## 2019-04-24 LAB — D-DIMER, QUANTITATIVE: D-Dimer, Quant: 2.34 ug/mL-FEU — ABNORMAL HIGH (ref 0.00–0.50)

## 2019-04-24 LAB — CBG MONITORING, ED: Glucose-Capillary: 135 mg/dL — ABNORMAL HIGH (ref 70–99)

## 2019-04-24 LAB — LACTIC ACID, PLASMA: Lactic Acid, Venous: 2.1 mmol/L (ref 0.5–1.9)

## 2019-04-24 LAB — TROPONIN I (HIGH SENSITIVITY): Troponin I (High Sensitivity): 25 ng/L — ABNORMAL HIGH (ref <18)

## 2019-04-24 LAB — SARS CORONAVIRUS 2 (TAT 6-24 HRS): SARS Coronavirus 2: NEGATIVE

## 2019-04-24 MED ORDER — CEFAZOLIN SODIUM-DEXTROSE 2-4 GM/100ML-% IV SOLN
2.0000 g | Freq: Two times a day (BID) | INTRAVENOUS | Status: DC
  Administered 2019-04-24 – 2019-04-25 (×3): 2 g via INTRAVENOUS
  Filled 2019-04-24 (×4): qty 100

## 2019-04-24 MED ORDER — CLINDAMYCIN PHOSPHATE 600 MG/50ML IV SOLN
600.0000 mg | Freq: Once | INTRAVENOUS | Status: AC
  Administered 2019-04-24: 600 mg via INTRAVENOUS
  Filled 2019-04-24: qty 50

## 2019-04-24 MED ORDER — SODIUM CHLORIDE 0.9% FLUSH: 3.0000 mL | Freq: Once | INTRAVENOUS | Status: DC

## 2019-04-24 MED ORDER — FUROSEMIDE 10 MG/ML IJ SOLN
40.0000 mg | Freq: Once | INTRAMUSCULAR | Status: AC
  Administered 2019-04-24: 40 mg via INTRAVENOUS
  Filled 2019-04-24: qty 4

## 2019-04-24 MED ORDER — INSULIN ASPART 100 UNIT/ML ~~LOC~~ SOLN
0.0000 [IU] | SUBCUTANEOUS | Status: DC
  Administered 2019-04-25 (×2): 2 [IU] via SUBCUTANEOUS
  Administered 2019-04-25 – 2019-04-26 (×3): 1 [IU] via SUBCUTANEOUS
  Administered 2019-04-26: 2 [IU] via SUBCUTANEOUS
  Administered 2019-04-26: 3 [IU] via SUBCUTANEOUS
  Administered 2019-04-26: 1 [IU] via SUBCUTANEOUS
  Administered 2019-04-27 (×2): 2 [IU] via SUBCUTANEOUS

## 2019-04-24 NOTE — H&P (Addendum)
[name] [name] OMB:[id number] DOB: [birth year] DOA: 04/24/2019    PCP: Georgann Housekeeper, MD   Outpatient Specialists:  CARDS:   Margaree Mackintosh, MD   nephrology Dr. Malen Gauze   surgeon Dr. Durwin Nora  Patient arrived to ER on 04/24/19 at 1709  Patient coming from: home LivesWith family    Chief Complaint:   Chief Complaint  Patient presents with  . Leg Pain    HPI: [name] [name] is a 69 y.o. female with medical history significant of DM2, HTN, CKD, CAD, PVD, combined systolic and diastolic heart failure, CVA, HLD, copd    Presented with  2 day hx of blister she have had some increase bilateral leg edema but worse on the left.  She noticed that the leg edema started when she restarted her Metformin by her primary care provider.  She also have had some worsening shortness of breath and dyspnea on exertion she is unable to sleep on a flat surfaces anymore.  Her dry weight is closer to 170 but lately she is gone up to 175.  She was recently restarted back on her Lasix twice a day but states it did not seem to help because she continued to swell up slowly up to her thighs. No associated chest pain Admitted on 20 February with atrial fibrillation new onset started on Eliquis Her Lasix had to be held on admission secondary to AKI with creatinine going up to 2  Initial plan was for TEE cardioversion but she converted spontaneously she was discharged on Toprol her Lasix was restarted prior to discharge. During hospitalization echogram showed EF of 25-30% which is down from baseline of 45.  Patient not on ACE inhibitor secondary to AKI  Infectious risk factors:  Reports  shortness of breath,     Has Not been vaccinated against COVID    in house  PCR testing  Pending  Lab Results  Component Value Date   SARSCOV2NAA NEGATIVE 03/20/2019   SARSCOV2NAA NEGATIVE 03/11/2019    Regarding pertinent Chronic problems:     Hyperlipidemia -  on statins Lipitor   HTN on metoprolol, Imdur   chronic CHF  diastolic/systolic/ combined - last echo Jan 2020  reduced EF of 45%, diffuse hypokinesis, grade 2 DD.   PAD -  2017 and underwent left femoral to below-knee popliteal artery bypass    carotid stenosis with total right ICA occlusion.   CAD  - On Aspirin, statin, betablocker, Plavix                 -  followed by cardiology                - last cardiac cath  05/11/2018  Severe disease too high risk for CABG  COPD - not on oxygen   DM 2 -  Lab Results  Component Value Date   HGBA1C 6.6 (H) 03/20/2019   on insulin,       obesity-   BMI Readings from Last 1 Encounters:  04/24/19 30.88 kg/m      Hx of CVA -  with/out residual deficits on  Plavix   A. Fib -  - CHA2DS2 vas score 8 :  current  on anticoagulation with   Eliquis,           -  Rate control:  Currently controlled with Toprolol,    CKD stage III - baseline Cr 1.9 Lab Results  Component Value Date   CREATININE 2.20 (H) 04/24/2019   CREATININE 1.92 (  H) 03/25/2019   CREATININE 2.04 (H) 03/24/2019      While in ER: Got IV lasix and reports some improvement but not back to baseline  Hospitalist was called for admission for possible left leg cellulitis  The following Work up has been ordered so far:  Orders Placed This Encounter  Procedures  . Blood culture (routine x 2)  . SARS CORONAVIRUS 2 (TAT 6-24 HRS) Nasopharyngeal Nasopharyngeal Swab  . DG Chest Port 1 View  . Basic metabolic panel  . CBC  . Lactic acid, plasma  . D-dimer, quantitative (not at [facility])  . Cardiac monitoring  . Saline Lock IV, Maintain IV access  . Weigh patient  . Consult to hospitalist  ALL PATIENTS BEING ADMITTED/HAVING PROCEDURES NEED COVID-19 SCREENING  . Pulse oximetry, continuous  . ED EKG  . VAS [place] LOWER EXTREMITY VENOUS (DVT) (ONLY [facility] & [facility])    Following Medications were ordered in ER: Medications  sodium chloride flush (NS) 0.9 % injection 3 mL (has no administration in time range)  clindamycin (CLEOCIN) IVPB 600 mg (600 mg  Intravenous New Bag/Given 04/24/19 1957)  furosemide (LASIX) injection 40 mg (40 mg Intravenous Given 04/24/19 1957)        Consult Orders  (From admission, onward)         Start     Ordered   04/24/19 1921  Consult to hospitalist  ALL PATIENTS BEING ADMITTED/HAVING PROCEDURES NEED COVID-19 SCREENING Pg sent by dee  Once    Comments: ALL PATIENTS BEING ADMITTED/HAVING PROCEDURES NEED COVID-19 SCREENING  Provider:  (Not yet assigned)  Question Answer Comment  Place call to: [facility]   Reason for Consult Admit      04/24/19 1920          Significant initial  Findings: Abnormal Labs Reviewed  BASIC METABOLIC PANEL - Abnormal; Notable for the following components:      Result Value   Potassium 5.2 (*)    CO2 21 (*)    Glucose, Bld 126 (*)    BUN 51 (*)    Creatinine, Ser 2.20 (*)    GFR calc non Af Amer 22 (*)    GFR calc Af Amer 26 (*)    All other components within normal limits  CBC - Abnormal; Notable for the following components:   Hemoglobin 9.6 (*)    HCT 31.4 (*)    MCV 78.3 (*)    MCH 23.9 (*)    RDW 17.2 (*)    All other components within normal limits  LACTIC ACID, PLASMA - Abnormal; Notable for the following components:   Lactic Acid, Venous 2.1 (*)    All other components within normal limits  D-DIMER, QUANTITATIVE (NOT AT [facility]) - Abnormal; Notable for the following components:   D-Dimer, Quant 2.34 (*)    All other components within normal limits    Otherwise labs showing:    Recent Labs  Lab 04/24/19 1745  NA 139  K 5.2*  CO2 21*  GLUCOSE 126*  BUN 51*  CREATININE 2.20*  CALCIUM 8.9    Cr   Up from baseline see below Lab Results  Component Value Date   CREATININE 2.20 (H) 04/24/2019   CREATININE 1.92 (H) 03/25/2019   CREATININE 2.04 (H) 03/24/2019    No results for input(s): AST, ALT, ALKPHOS, BILITOT, PROT, ALBUMIN in the last 168 hours. Lab Results  Component Value Date   CALCIUM 8.9 04/24/2019   PHOS 3.6 10/19/2016  WBC      Component Value Date/Time   WBC 8.9 04/24/2019 1745   ANC    Component Value Date/Time   NEUTROABS 5.4 03/22/2019 0222   ALC No components found for: LYMPHAB    Plt: Lab Results  Component Value Date   PLT 364 04/24/2019    Lactic Acid, Venous    Component Value Date/Time   LATICACIDVEN 2.1 (HH) 04/24/2019 1848      COVID-19 Labs  Recent Labs    04/24/19 1848  DDIMER 2.34*    Lab Results  Component Value Date   SARSCOV2NAA NEGATIVE 03/20/2019   SARSCOV2NAA NEGATIVE 03/11/2019     HG/HCT  Stable     Component Value Date/Time   HGB 9.6 (L) 04/24/2019 1745   HCT 31.4 (L) 04/24/2019 1745     Troponin  ordered     ECG: Ordered Personally reviewed by me showing: HR : 71 Rhythm:  NSR,   no evidence of ischemic changes QTC 510   BNP (last 3 results) Recent Labs    03/11/19 1823 03/20/19 1157 03/23/19 0930  BNP 2,460.7* 2,161.2* 2,115.8*    ProBNP (last 3 results) No results for input(s): PROBNP in the last 8760 hours.  DM  labs:  HbA1C: Recent Labs    03/20/19 1911  HGBA1C 6.6*       CBG (last 3)  No results for input(s): GLUCAP in the last 72 hours.     UA  ordered       Ordered   CXR - possible early edema     ED Triage Vitals  Enc Vitals Group     BP 04/24/19 1713 126/65     Pulse Rate 04/24/19 1713 70     Resp 04/24/19 1713 14     Temp 04/24/19 1713 (!) 97.4 F (36.3 C)     Temp Source 04/24/19 1713 Oral     SpO2 04/24/19 1713 99 %     Weight 04/24/19 1854 179 lb 14.3 oz (81.6 kg)     Height --      Head Circumference --      Peak Flow --      Pain Score 04/24/19 1732 5     Pain Loc --      Pain Edu? --      Excl. in GC? --   TMAX(24)@       Latest  Blood pressure (!) 149/65, pulse 73, temperature (!) 97.4 F (36.3 C), temperature source Oral, resp. rate 20, weight 81.6 kg, SpO2 95 %.    Review of Systems:    Pertinent positives include:  shortness of breath at rest.  dyspnea on  exertion Constitutional:  No weight loss, night sweats, Fevers, chills, fatigue, weight loss  HEENT:  No headaches, Difficulty swallowing,Tooth/dental problems,Sore throat,  No sneezing, itching, ear ache, nasal congestion, post nasal drip,  Cardio-vascular:  No chest pain, Orthopnea, PND, anasarca, dizziness, palpitations.no Bilateral lower extremity swelling  GI:  No heartburn, indigestion, abdominal pain, nausea, vomiting, diarrhea, change in bowel habits, loss of appetite, melena, blood in stool, hematemesis Resp:  no  No, No excess mucus, no productive cough, No non-productive cough, No coughing up of blood.No change in color of mucus.No wheezing. Skin:  no rash or lesions. No jaundice GU:  no dysuria, change in color of urine, no urgency or frequency. No straining to urinate.  No flank pain.  Musculoskeletal:  No joint pain or no joint swelling. No decreased range of motion. No back  pain.  Psych:  No change in mood or affect. No depression or anxiety. No memory loss.  Neuro: no localizing neurological complaints, no tingling, no weakness, no double vision, no gait abnormality, no slurred speech, no confusion  All systems reviewed and apart from HOPI all are negative  Past Medical History:   Past Medical History:  Diagnosis Date  . Anemia 10/2015   Acute Blood Loss  . Arthritis    "feel like I have it all over" (08/28/2015)  . CHF (congestive heart failure) (HCC)   . Complication of anesthesia    DIFFICULT WAKING "only when I was smoking; no problems since I quit"  . Coronary artery disease   . Family history of adverse reaction to anesthesia    sister slow to wake up  . GERD (gastroesophageal reflux disease)    takes Protonix daily   . Hip bursitis   . History of blood transfusion    10/2015  . Hyperlipidemia LDL goal < 70 06/28/2013   takes Atorvastatin daily  . Hypertension    takes Metoprolol and Imdur daily  . Hypoxia 01/2018  . Malnutrition (HCC)   .  Migraine    "none in a long time" (08/28/2015)  . Myocardial infarction (HCC) 2011  . Neuromuscular disorder (HCC)    DIABETIC NEUROPATHY  . Osteomyelitis (HCC) 2017   Left foot  . PAD (peripheral artery disease) (HCC)   . Peripheral vascular disease (HCC)   . Respiratory failure (HCC) 10/2015   Acute Hypoxia- acute pulmonary edema 11/13/2015  . Septic shock (HCC) 10/2015  . Stroke (HCC)   . Type II diabetes mellitus (HCC)    takes Lantus nightly.Average fasting blood sugar runs 80-90  Type II      Past Surgical History:  Procedure Laterality Date  . ABDOMINAL HYSTERECTOMY    . AMPUTATION Right 10/22/2016   Procedure: AMPUTATION DIGIT RIGHT TOES 1-3 POSSIBLE TRANSMETATARSAL;  Surgeon: Chuck Hint, MD;  Location: [facility] OR;  Service: Vascular;  Laterality: Right;  . APPENDECTOMY    . ATHERECTOMY N/A 06/04/2011   Procedure: ATHERECTOMY;  Surgeon: Runell Gess, MD;  Location: [facility] CATH LAB;  Service: Cardiovascular;  Laterality: N/A;  . CARDIAC CATHETERIZATION  10/13/2009   95% stenosis in the AV groove circumflex and 95% ostial stenosis in small OM3. A 3x66mm drug-eluting Promus stent inserted ito the circumflex. Dilatated with a 3.25x57mm noncompliant Quantum balloon within entire segment. The entire region was reduced to 0% and brisk TIMI3 flow.  . CAROTID DUPLEX  03/19/2011   Right ICA-demonstrates complete occlusion. Left ICA-demonstrates a small amount of fibrous plaque.  Marland Kitchen CATARACT EXTRACTION W/ INTRAOCULAR LENS IMPLANT Right   . CESAREAN SECTION  1990  . CORONARY ANGIOPLASTY    . ENDARTERECTOMY FEMORAL Left 09/05/2015   Procedure: ENDARTERECTOMY FEMORAL WITH PROFUNDOPLASTY;  Surgeon: Chuck Hint, MD;  Location: [facility] OR;  Service: Vascular;  Laterality: Left;  Left common femoral artery vein patch using left saphenous vien  . ENDARTERECTOMY FEMORAL Right 10/22/2016   Procedure: ENDARTERECTOMY RIGHT COMMON FEMORAL;  Surgeon: Chuck Hint, MD;  Location: [facility] OR;   Service: Vascular;  Laterality: Right;  . FEMORAL-POPLITEAL BYPASS GRAFT Left 11/02/2015   Procedure: BYPASS GRAFT FEMORAL-POPLITEAL ARTERY VS FEMORAL-TIBIAL ARTERY BYPASS;  Surgeon: Chuck Hint, MD;  Location: [facility] OR;  Service: Vascular;  Laterality: Left;  . FEMORAL-POPLITEAL BYPASS GRAFT Right 10/22/2016   Procedure: BYPASS GRAFT RIGHT FEMORAL- BELOW KNEE POPLITEAL ARTERY WITH VEIN;  Surgeon: Chuck Hint, MD;  Location[facility] OR;  Service: Vascular;  Laterality: Right;  . I & D EXTREMITY Left 11/10/2015   Procedure: Debridement Left Foot Ulcer, Application  Wound VAC;  Surgeon: [name], MD;  Location: [facility] OR;  Service: Orthopedics;  Laterality: Left;  . ILIAC ARTERY STENT Left 08/28/2015   common  . INTRAOPERATIVE ARTERIOGRAM Left 09/05/2015   Procedure: INTRA OPERATIVE ARTERIOGRAM;  Surgeon: Chuck Hint, MD;  Location: [facility] OR;  Service: Vascular;  Laterality: Left;  . INTRAOPERATIVE ARTERIOGRAM Left 11/02/2015   Procedure: INTRA OPERATIVE ARTERIOGRAM;  Surgeon: Chuck Hint, MD;  Location: [facility] OR;  Service: Vascular;  Laterality: Left;  . INTRAOPERATIVE ARTERIOGRAM Right 10/22/2016   Procedure: INTRA OPERATIVE ARTERIOGRAM;  Surgeon: Chuck Hint, MD;  Location: [facility] OR;  Service: Vascular;  Laterality: Right;  . LEFT HEART CATH AND CORONARY ANGIOGRAPHY N/A 05/11/2018   Procedure: LEFT HEART CATH AND CORONARY ANGIOGRAPHY;  Surgeon: Lennette Bihari, MD;  Location: [facility] INVASIVE CV LAB;  Service: Cardiovascular;  Laterality: N/A;  Eugenie Birks MYOVIEW  10/25/2010   Moderate perfusion defect due to infarct/scar with mild perinfarct ischemia seen in the Basal Inferolateral, Basal Anterolateral, Mid Inferolateral, and Mid Anterolateral regions. Post-stress EF is 50%.  . LOWER EXTREMITY ANGIOGRAPHY N/A 10/17/2016   Procedure: Lower Extremity Angiography;  Surgeon: Runell Gess, MD;  Location: [facility] INVASIVE CV LAB;  Service: Cardiovascular;  Laterality: N/A;  .  OVARY SURGERY  1983?   "ruptured"  . PERIPHERAL VASCULAR ANGIOGRAM  01/26/2010   High-grade SFA disease: left greater than right. Left SFA would require fem-pop bypass grafting. Right SFA could be stented but might require Diamondback Orbital atherectomy.  Marland Kitchen PERIPHERAL VASCULAR ANGIOGRAM  02/23/2010   Stealth Predator orbital rotational atherectomy performed on SFA & Popliteal up to 90,000 RPM. Stenting using overlapping 5x167mm and 5x14mm Absolute Pro Nitinol self-expanding stents beginning just at the knee up to the mid SFA resulting in reduction of 90-95% calcified SFA & Popliteal stenosis to 0. Stenting performed on the distal common & proximal iliac artery with a 10x4 Absolute Pro- 70-0%.  Marland Kitchen PERIPHERAL VASCULAR ANGIOGRAM  06/17/2010   PTA performed to the right external iliac artery stent using a 5x100 balloon at 10 atmospheres. Stenting performed using a 6x18 Genesis on Opta balloon. Postdilatation with a 7x2 balloon resulting in a 95% "in-stent" stenosis to 0% residual.  . PERIPHERAL VASCULAR ANGIOGRAM  06/04/2011   Bilateral total SFAs not percutaneously addressable. Good canidate for femoropopliteal bypass grafting  . PERIPHERAL VASCULAR ANGIOGRAM  08/28/2015  . PERIPHERAL VASCULAR CATHETERIZATION N/A 08/28/2015   Procedure: Lower Extremity Angiography;  Surgeon: Runell Gess, MD;  Location: [facility] INVASIVE CV LAB;  Service: Cardiovascular;  Laterality: N/A;  . PERIPHERAL VASCULAR CATHETERIZATION N/A 08/28/2015   Procedure: Abdominal Aortogram;  Surgeon: Runell Gess, MD;  [facility]VAS[facility] CV LAB;  Service: Cardiovascular;  Laterality: N/A;  . PERIPHERAL VASCULAR CATHETERIZATION Left 08/28/2015   Procedure: Peripheral Vascular Intervention;  Surgeon: Runell Gess, MD;  Location: [facility] INVASIVE CV LAB;  Service: Cardiovascular;  Laterality: Left;  common iliac  . PERIPHERAL VASCULAR CATHETERIZATION Left 08/28/2015   Procedure: Peripheral Vascular Atherectomy;  Surgeon: Runell Gess,  MD;  Location: [facility] INVASIVE CV LAB;  Service: Cardiovascular;  Laterality: Left;  common iliac  . PERIPHERAL VASCULAR CATHETERIZATION N/A 09/28/2015   Procedure: Lower Extremity Angiography;  Surgeon: Runell Gess, MD;  Location: [facility] INVASIVE CV LAB;  Service: Cardiovascular;  Laterality: N/A;  . PERIPHERAL VASCULAR CATHETERIZATION Left  09/28/2015   Procedure: Peripheral Vascular Intervention;  Surgeon: Runell Gess, MD;  Location: [facility] INVASIVE CV LAB;  Service: Cardiovascular;  Laterality: Left CFA  PCI with 9 mm x 4 cm Abbott nitinol absolute Pro self-expanding stent     . SKIN SPLIT GRAFT Left 12/01/2015   Procedure: LEFT FOOT SKIN GRAFT AND VAC;  Surgeon: [name], MD;  Location: [facility] OR;  Service: Orthopedics;  Laterality: Left;  . THROMBECTOMY FEMORAL ARTERY Right 10/22/2016   Procedure: THROMBECTOMY FEMORAL ARTERY;  Surgeon: Chuck Hint, MD;  Location: [facility] OR;  Service: Vascular;  Laterality: Right;  . TRANSTHORACIC ECHOCARDIOGRAM  10/17/2009   EF 45-50%, moderate hypokinesis of the entire inferolateral myocardium, mild concentric hypertrophy and mild regurg of the mitral valva.  Marland Kitchen VEIN HARVEST Left 11/02/2015   Procedure: LEFT GREATER SAPHENOUS VEIN HARVEST;  Surgeon: Chuck Hint, MD;  Location: [facility] OR;  Service: Vascular;  Laterality: Left;    Social History:  Ambulatory  Walker      reports that she quit smoking about 9 years ago. Her smoking use included cigarettes. She has a 61.50 pack-year smoking history. She has never used smokeless tobacco. She reports that she does not drink alcohol or use drugs.   Family History:   Family History  Problem Relation Age of Onset  . Hypertension Mother   . Heart failure Mother   . Heart failure Father   . Stroke Father   . Diabetes Father     Allergies: Allergies  Allergen Reactions  . Doxycycline Other (See Comments)    Lethargy   . Hydrochlorothiazide Other (See Comments)    Lethargy   . Latex Rash  .  Penicillins Swelling and Rash    Pt states she has tolerated Keflex in the past without problems. States she may have tolerated Augmentin in the past but it caused GI upset. Has patient had a PCN reaction causing immediate rash, facial/tongue/throat swelling, SOB or lightheadedness with hypotension: Yes Has patient had a PCN reaction causing severe rash involving mucus membranes or skin necrosis: No Has patient had a PCN reaction that required hospitalization No Has patient had a PCN reaction occurring within the last 10 years: No     Prior to Admission medications   Medication Sig Start Date End Date Taking? Authorizing Provider  acetaminophen (TYLENOL) 325 MG tablet Take 650 mg by mouth every 6 (six) hours as needed for mild pain, fever or headache.   Yes [provider]  apixaban (ELIQUIS) 5 MG TABS tablet Take 1 tablet (5 mg total) by mouth 2 (two) times daily. 03/25/19  Yes Alessandra Bevels, MD  atorvastatin (LIPITOR) 80 MG tablet Take 80 mg by mouth at bedtime.    Yes [provider]  clopidogrel (PLAVIX) 75 MG tablet Take 75 mg by mouth daily.    Yes [provider]  furosemide (LASIX) 40 MG tablet Take 1 tablet (40 mg total) by mouth daily. 03/15/19  Yes Marlin Canary U, DO  isosorbide mononitrate (IMDUR) 30 MG 24 hr tablet Take 3 tablets (90 mg total) by mouth daily. 03/25/19  Yes Alessandra Bevels, MD  LEVEMIR FLEXTOUCH 100 UNIT/ML Pen Inject 35 Units into the skin at bedtime.  03/15/16  Yes [provider]  metFORMIN (GLUCOPHAGE) 1000 MG tablet Take 1,000 mg by mouth 2 (two) times daily with a meal.   Yes [provider]  metoprolol succinate (TOPROL-XL) 100 MG 24 hr tablet TAKE 1 AND 1/2 TABLETS BY MOUTH DAILY Patient  taking differently: Take 150 mg by mouth daily.  03/25/19  Yes Guilford Shi, MD  nitroGLYCERIN (NITROSTAT) 0.4 MG SL tablet Place 0.4 mg under the tongue every 5 (five) minutes as needed for chest pain.  05/15/18  Yes  [provider]  pantoprazole (PROTONIX) 40 MG tablet Take 1 tablet (40 mg total) by mouth daily. 12/21/18  Yes Troy Sine, MD  VENTOLIN HFA 108 (90 Base) MCG/ACT inhaler Inhale 1 puff into the lungs every 4 (four) hours as needed for shortness of breath. 03/02/18  Yes [provider]  docusate sodium (COLACE) 100 MG capsule Take 2 capsules (200 mg total) by mouth daily. Patient not taking: Reported on 04/24/2019 03/26/19   Guilford Shi, MD  gabapentin (NEURONTIN) 300 MG capsule Take 1 capsule (300 mg total) by mouth at bedtime. Patient not taking: Reported on 04/24/2019 05/13/18   Domenic Polite, MD  meclizine (ANTIVERT) 12.5 MG tablet Take 1 tablet (12.5 mg total) by mouth 2 (two) times daily as needed for dizziness or nausea. Patient not taking: Reported on 04/24/2019 03/25/19   Guilford Shi, MD  ondansetron (ZOFRAN) 4 MG tablet Take 1 tablet (4 mg total) by mouth daily as needed for nausea or vomiting. Patient not taking: Reported on 04/24/2019 03/25/19 03/24/20  Guilford Shi, MD   Physical Exam: Blood pressure (!) 149/65, pulse 73, temperature (!) 97.4 F (36.3 C), temperature source Oral, resp. rate 20, weight 81.6 kg, SpO2 95 %. 1. General:  in No  Acute distress    Chronically ill  -appearing 2. Psychological: Alert and Oriented 3. Head/ENT:   Moist  Mucous Membranes                          Head Non traumatic, neck supple                            Poor Dentition 4. SKIN:   Normal Skin turgor,  Skin clean Dry and intact no rash, blister on left leg  5. Heart: Regular rate and rhythm no Murmur, no Rub or gallop 6. Lungs no wheezes some crackles   7. Abdomen: Soft,    non-tender, Non distended  obese  bowel sounds present 8. Lower extremities: no clubbing, cyanosis,  Edema with serosanguinous blister on left leg 9. Neurologically Grossly intact, moving all 4 extremities equally  10. MSK: Normal range of motion   All other LABS:     Recent Labs    Lab 04/24/19 1745  WBC 8.9  HGB 9.6*  HCT 31.4*  MCV 78.3*  PLT 364     Recent Labs  Lab 04/24/19 1745  NA 139  K 5.2*  CL 106  CO2 21*  GLUCOSE 126*  BUN 51*  CREATININE 2.20*  CALCIUM 8.9     No results for input(s): AST, ALT, ALKPHOS, BILITOT, PROT, ALBUMIN in the last 168 hours.     Cultures:    Component Value Date/Time   SDES BLOOD RIGHT ANTECUBITAL 02/14/2018 0748   SPECREQUEST  02/14/2018 0748    BOTTLES DRAWN AEROBIC ONLY Blood Culture adequate volume   CULT  02/14/2018 0748    NO GROWTH 5 DAYS Performed at [facility], [place] [address], [place], [place] [address]    REPTSTATUS 02/19/2018 FINAL 02/14/2018 0748     Radiological Exams on Admission: DG Chest Port 1 View  Result Date: 04/24/2019 CLINICAL DATA:  Dyspnea. Bilateral leg  edema. EXAM: PORTABLE CHEST 1 VIEW COMPARISON:  03/23/2019 FINDINGS: The heart size remains enlarged. Aortic calcifications are noted. There is vascular congestion with possible early pulmonary edema. There is a small right-sided pleural effusion. There is no focal infiltrate. There is probable atelectasis at the lung bases. IMPRESSION: Cardiomegaly with pulmonary vascular congestion and possible early pulmonary edema. Small right pleural effusion. Electronically Signed   By: Katherine Mantle M.D.   On: 04/24/2019 18:33    Chart has been reviewed   Assessment/Plan   69 y.o. female with medical history significant of DM2, HTN, CKD, CAD, PVD, combined systolic and diastolic heart failure, CVA, HLD Admitted for CHF exacerbation and early cellulitis of left leg  Present on Admission: . Cellulitis - mild, Ancef IV patient is pen allergic but has tolerated cephalosporins in the past, no foreign body, if blister ruptures high risk of infection . Acute on chronic systolic CHF (congestive heart failure) (HCC) -  - admit on telemetry,  cycle cardiac enzymes, Troponin   obtain serial ECG  to evaluate for ischemia as a cause of  heart failure  monitor daily weight:  Filed Weights   04/24/19 1854  Weight: 81.6 kg   Last BNP BNP (last 3 results) Recent Labs    03/11/19 1823 03/20/19 1157 03/23/19 0930  BNP 2,460.7* 2,161.2* 2,115.8*      diurese with IV lasix and monitor orthostatics and creatinine to avoid over diuresis.  Order echogram to evaluate EF and valves  ACE/ARBi   Contraindicated      exacerbation . Hyperlipidemia with target LDL less than 70 - continue home meds  . PAD (peripheral artery disease) (HCC) - chronic cont plavix  . Coronary artery disease involving native coronary artery of native heart without angina pectoris -  - chronic, continue   Statin   and beta blocker    . Diabetes mellitus type II, uncontrolled (HCC)-  - Order Sensitive   SSI   - continue home insulin regimen  But decrease to 30 units,  -  check TSH and HgA1C  - Hold by mouth medications     . CVA (cerebral vascular accident) (HCC)- continue Plavix and Statin  . GERD (gastroesophageal reflux disease) - chronic stable  . CKD (chronic kidney disease) stage 3, GFR 30-59 ml/min - Cr up from baseline And has been lately since admit appears fluid overload Will diurese and monitor Cr May need nephrology consult  . Benign hypertension- stable cont home meds  . Chronic a-fib (HCC) -         - CHA2DS2 vas score 8: continue current anticoagulation with Eliquis,         -  Rate control:  Currently controlled with  Toprolol,        Other plan as per orders.  DVT prophylaxis:  Eliquis  Code Status:  FULL CODE  as per patient   I had personally discussed CODE STATUS with patient   Family Communication:   Family not at  Bedside    Disposition Plan:      To home once workup is complete and patient is stable   Following barriers for discharge:                            Electrolytes corrected                             Pain controlled with  PO medications                             able to transition to PO  antibiotics                                                      Will need consultants to evaluate patient prior to discharge                       Would benefit from PT/OT eval prior to DC  Ordered                                        Consults called: Please consult in AM cardiology    Admission status:  ED Disposition    ED Disposition Condition Comment   Admit  Hospital [facility] [[id number]]  Level of Care: Telemetry Cardiac [[id number]]  May admit patient to [facility] or [facility] if equivalent level of care is available:: No  Covid Evaluation: Symptomatic Person Under Investigation (PUI)  Diagnosis: CHF exacerbation [facility]) [[id number]]  Admitting Physician: Therisa Doyne [[id number]]  Attending Physician: Therisa Doyne [[id number]]  Estimated length of stay: past midnight tomorrow  Certification:: I certify this patient will need inpatient services for at least 2 midnights         inpatient     I Expect 2 midnight stay secondary to severity of patient's current illness need for inpatient interventions justified by the following:  Severe lab/radiological/exam abnormalities including:    elevated lactic acid and hyperkalemia  and extensive comorbidities including:  DM2   CHF  CAD  COPD/asthma   CKD  history of stroke with residual deficits .  Chronic anticoagulation  That are currently affecting medical management.   I expect  patient to be hospitalized for 2 midnights requiring inpatient medical care.  Patient is at high risk for adverse outcome (such as loss of life or disability) if not treated.  Indication for inpatient stay as follows:    Need for IV antibiotics, IV diuretics    Level of care    tele  For   24H    Precautions: admitted as PUI   No active isolations  If Covid PCR is negative  - please DC precautions     PPE: Used by the provider:   P100  eye Goggles,  Gloves    Diron Haddon 04/24/2019, 10:23 PM    [facility]     after 2 AM please page floor coverage PA If 7AM-7PM, please contact the day team taking care of the patient using [email or web address]   Patient was evaluated in the context of the global COVID-19 pandemic, which necessitated consideration that the patient might be at risk for infection with the SARS-CoV-2 virus that causes COVID-19. Institutional protocols and algorithms that pertain to the evaluation of patients at risk for COVID-19 are in a state of rapid change based on information released by regulatory bodies including the CDC and federal and state organizations. These policies and algorithms were followed during the patient's care.

## 2019-04-24 NOTE — ED Provider Notes (Signed)
[facility] EMERGENCY DEPARTMENT Provider Note   CSN: [id number] Arrival date & time: 04/24/19  1709     History Chief Complaint  Patient presents with  . Leg Pain    [name] [name] is a 69 y.o. female.  HPI 69 year old female history of congestive heart failure, COPD, hypertension, diabetes, peripheral vascular disease, presents today complaining of left leg pain and swelling.  She reports that she had a blister that began on Thursday morning.  She spoke with her physician and was told to go to urgent care.  Today she was seen [facility] urgent care told that she had volume overload and needed to come to the ED.  She reports some baseline dyspnea does not feel that this is sniffily worse.  She reports her normal body weight is 168 is not sure if she is about this or not.  She takes Lasix 80 mg daily.  She was also placed on Eliquis during her last hospital admission.  She does not report any history of DVT or PE.  She denies having any fever or chills.  She has had amputations of her right foot previously due to vascular insufficiency.  She reports that Dr. Durwin Nora did partial amputation of her right foot in the past.  She has not had any ongoing issues with this recently.  Her primary care is [facility].  She is followed by Dr. Malen Gauze at [facility].    Past Medical History:  Diagnosis Date  . Anemia 10/2015   Acute Blood Loss  . Arthritis    "feel like I have it all over" (08/28/2015)  . CHF (congestive heart failure) (HCC)   . Complication of anesthesia    DIFFICULT WAKING "only when I was smoking; no problems since I quit"  . Coronary artery disease   . Family history of adverse reaction to anesthesia    sister slow to wake up  . GERD (gastroesophageal reflux disease)    takes Protonix daily   . Hip bursitis   . History of blood transfusion    10/2015  . Hyperlipidemia LDL goal < 70 06/28/2013   takes Atorvastatin daily  . Hypertension    takes  Metoprolol and Imdur daily  . Hypoxia 01/2018  . Malnutrition (HCC)   . Migraine    "none in a long time" (08/28/2015)  . Myocardial infarction (HCC) 2011  . Neuromuscular disorder (HCC)    DIABETIC NEUROPATHY  . Osteomyelitis (HCC) 2017   Left foot  . PAD (peripheral artery disease) (HCC)   . Peripheral vascular disease (HCC)   . Respiratory failure (HCC) 10/2015   Acute Hypoxia- acute pulmonary edema 11/13/2015  . Septic shock (HCC) 10/2015  . Stroke (HCC)   . Type II diabetes mellitus (HCC)    takes Lantus nightly.Average fasting blood sugar runs 80-90  Type II    Patient Active Problem List   Diagnosis Date Noted  . AKI (acute kidney injury) (HCC) 03/25/2019  . Vertigo 03/25/2019  . Atrial fibrillation with RVR (HCC) 03/20/2019  . Hyperkalemia   . Dyspnea 03/11/2019  . Elevated troponin 03/11/2019  . Chest pain 03/11/2019  . Acute on chronic combined systolic and diastolic CHF (congestive heart failure) (HCC) 05/08/2018  . Renal insufficiency 02/13/2018  . CKD (chronic kidney disease) stage 3, GFR 30-59 ml/min 02/13/2018  . Acute systolic CHF (congestive heart failure) (HCC) 02/13/2018  . Benign hypertension 11/26/2017  . Gangrene of right foot (HCC) 11/01/2016  .  GERD (gastroesophageal reflux disease) 11/01/2016  . Gangrene of left foot ([place]) 10/17/2016  . Sepsis ([place]) 10/17/2016  . Midfoot ulcer, left, limited to breakdown of skin ([place]) 01/08/2016  . Thigh hematoma 12/22/2015  . History of blood transfusion 12/22/2015  . Acute on chronic systolic CHF (congestive heart failure) ([place]) 12/22/2015  . H/O skin graft 12/11/2015  . SEMI (subendocardial myocardial infarction) ([place]) 11/19/2015  . DM type 2 with diabetic peripheral neuropathy ([place]) 11/19/2015  . Traumatic hemorrhagic shock ([place])   . Chronic systolic CHF (congestive heart failure) ([place])   . Uncontrolled type 2 diabetes mellitus with complication ([place])   . Demand myocardial infarction ([place]) 11/08/2015  .  Surgery, elective   . Central line infection, initial encounter   . Septic shock ([place]) 11/03/2015  . Encephalopathy acute 11/03/2015  . Acute blood loss as cause of postoperative anemia 11/03/2015  . Acute respiratory failure with hypoxia ([place])   . CVA (cerebral vascular accident) ([place])   . S/P femoral-popliteal bypass surgery   . Malnutrition of moderate degree 11/01/2015  . Diabetic ulcer of left midfoot associated with diabetes mellitus due to underlying condition, with muscle involvement without evidence of necrosis ([place])   . Hypertensive heart disease with chronic diastolic congestive heart failure ([place]) 10/31/2015  . Osteomyelitis of left foot ([place]) 10/30/2015  . PVD (peripheral vascular disease) with claudication ([place]) 09/28/2015  . Critical lower limb ischemia 08/28/2015  . Hyperlipidemia with target LDL less than 70 06/28/2013  . Diabetes mellitus type II, uncontrolled ([place]) 03/03/2013  . Complicated migraine 38/46/6599  . Stroke-like symptom 03/02/2013  . Diabetes mellitus ([place]) 03/02/2013  . Aphasia 03/02/2013  . Headache(784.0) 03/02/2013  . PAD (peripheral artery disease) ([place]) 06/05/2011  . Coronary artery disease involving native coronary artery of native heart without angina pectoris 06/05/2011  . Hypotension 06/05/2011  . Claudication in peripheral vascular disease:  Lifestyle limiting. 06/05/2011  . Hx of tobacco use, presenting hazards to health 06/05/2011    Past Surgical History:  Procedure Laterality Date  . ABDOMINAL HYSTERECTOMY    . AMPUTATION Right 10/22/2016   Procedure: AMPUTATION DIGIT RIGHT TOES 1-3 POSSIBLE TRANSMETATARSAL;  Surgeon: Angelia Mould, MD;  Location: [place];  Service: Vascular;  Laterality: Right;  . APPENDECTOMY    . ATHERECTOMY N/A 06/04/2011   Procedure: ATHERECTOMY;  Surgeon: Lorretta Harp, MD;  Location: [facility] CATH LAB;  Service: Cardiovascular;  Laterality: N/A;  . CARDIAC CATHETERIZATION  10/13/2009   95% stenosis in the AV  groove circumflex and 95% ostial stenosis in small OM3. A 3x66mm drug-eluting Promus stent inserted ito the circumflex. Dilatated with a 3.25x4mm noncompliant Quantum balloon within entire segment. The entire region was reduced to 0% and brisk TIMI3 flow.  . CAROTID DUPLEX  03/19/2011   Right ICA-demonstrates complete occlusion. Left ICA-demonstrates a small amount of fibrous plaque.  Marland Kitchen CATARACT EXTRACTION W/ INTRAOCULAR LENS IMPLANT Right   . CESAREAN SECTION  1990  . CORONARY ANGIOPLASTY    . ENDARTERECTOMY FEMORAL Left 09/05/2015   Procedure: ENDARTERECTOMY FEMORAL WITH PROFUNDOPLASTY;  Surgeon: Angelia Mould, MD;  Location: [facility] OR;  Service: Vascular;  Laterality: Left;  Left common femoral artery vein patch using left saphenous vien  . ENDARTERECTOMY FEMORAL Right 10/22/2016   Procedure: ENDARTERECTOMY RIGHT COMMON FEMORAL;  Surgeon: Angelia Mould, MD;  Location: [place];  Service: Vascular;  Laterality: Right;  . FEMORAL-POPLITEAL BYPASS GRAFT Left 11/02/2015   Procedure: BYPASS GRAFT FEMORAL-POPLITEAL ARTERY VS FEMORAL-TIBIAL ARTERY BYPASS;  Surgeon: Judeth Cornfield  Edilia Bo, MD;  Location: [facility] OR;  Service: Vascular;  Laterality: Left;  . FEMORAL-POPLITEAL BYPASS GRAFT Right 10/22/2016   Procedure: BYPASS GRAFT RIGHT FEMORAL- BELOW KNEE POPLITEAL ARTERY WITH VEIN;  Surgeon: Chuck Hint, MD;  Location: [facility] OR;  Service: Vascular;  Laterality: Right;  . I & D EXTREMITY Left 11/10/2015   Procedure: Debridement Left Foot Ulcer, Application  Wound VAC;  Surgeon: [name], MD;  Location: [facility] OR;  Service: Orthopedics;  Laterality: Left;  . ILIAC ARTERY STENT Left 08/28/2015   common  . INTRAOPERATIVE ARTERIOGRAM Left 09/05/2015   Procedure: INTRA OPERATIVE ARTERIOGRAM;  Surgeon: Chuck Hint, MD;  Location: [facility] OR;  Service: Vascular;  Laterality: Left;  . INTRAOPERATIVE ARTERIOGRAM Left 11/02/2015   Procedure: INTRA OPERATIVE ARTERIOGRAM;  Surgeon: Chuck Hint, MD;  Location: [facility] OR;  Service: Vascular;  Laterality: Left;  . INTRAOPERATIVE ARTERIOGRAM Right 10/22/2016   Procedure: INTRA OPERATIVE ARTERIOGRAM;  Surgeon: Chuck Hint, MD;  Location: [facility] OR;  Service: Vascular;  Laterality: Right;  . LEFT HEART CATH AND CORONARY ANGIOGRAPHY N/A 05/11/2018   Procedure: LEFT HEART CATH AND CORONARY ANGIOGRAPHY;  Surgeon: Lennette Bihari, MD;  Location: [facility] INVASIVE CV LAB;  Service: Cardiovascular;  Laterality: N/A;  Eugenie Birks MYOVIEW  10/25/2010   Moderate perfusion defect due to infarct/scar with mild perinfarct ischemia seen in the Basal Inferolateral, Basal Anterolateral, Mid Inferolateral, and Mid Anterolateral regions. Post-stress EF is 50%.  . LOWER EXTREMITY ANGIOGRAPHY N/A 10/17/2016   Procedure: Lower Extremity Angiography;  Surgeon: Runell Gess, MD;  Location: [facility] INVASIVE CV LAB;  Service: Cardiovascular;  Laterality: N/A;  . OVARY SURGERY  1983?   "ruptured"  . PERIPHERAL VASCULAR ANGIOGRAM  01/26/2010   High-grade SFA disease: left greater than right. Left SFA would require fem-pop bypass grafting. Right SFA could be stented but might require Diamondback Orbital atherectomy.  Marland Kitchen PERIPHERAL VASCULAR ANGIOGRAM  02/23/2010   Stealth Predator orbital rotational atherectomy performed on SFA & Popliteal up to 90,000 RPM. Stenting using overlapping 5x128mm and 5x49mm Absolute Pro Nitinol self-expanding stents beginning just at the knee up to the mid SFA resulting in reduction of 90-95% calcified SFA & Popliteal stenosis to 0. Stenting performed on the distal common & proximal iliac artery with a 10x4 Absolute Pro- 70-0%.  Marland Kitchen PERIPHERAL VASCULAR ANGIOGRAM  06/17/2010   PTA performed to the right external iliac artery stent using a 5x100 balloon at 10 atmospheres. Stenting performed using a 6x18 Genesis on Opta balloon. Postdilatation with a 7x2 balloon resulting in a 95% "in-stent" stenosis to 0% residual.  . PERIPHERAL VASCULAR ANGIOGRAM   06/04/2011   Bilateral total SFAs not percutaneously addressable. Good canidate for femoropopliteal bypass grafting  . PERIPHERAL VASCULAR ANGIOGRAM  08/28/2015  . PERIPHERAL VASCULAR CATHETERIZATION N/A 08/28/2015   Procedure: Lower Extremity Angiography;  Surgeon: Runell Gess, MD;  Location: [facility] INVASIVE CV LAB;  Service: Cardiovascular;  Laterality: N/A;  . PERIPHERAL VASCULAR CATHETERIZATION N/A 08/28/2015   Procedure: Abdominal Aortogram;  Surgeon: Runell Gess, MD;  [facility] INVASIVE CV LAB;  Service: Cardiovascular;  Laterality: N/A;  . PERIPHERAL VASCULAR CATHETERIZATION Left 08/28/2015   Procedure: Peripheral Vascular Intervention;  Surgeon: Runell Gess, MD;  Location: [facility] INVASIVE CV LAB;  Service: Cardiovascular;  Laterality: Left;  common iliac  . PERIPHERAL VASCULAR CATHETERIZATION Left 08/28/2015   Procedure: Peripheral Vascular Atherectomy;  Surgeon: Runell Gess, MD;  Location: [facility] INVASIVE CV LAB;  Service: Cardiovascular;  Laterality: Left;  common iliac  . PERIPHERAL VASCULAR CATHETERIZATION N/A 09/28/2015   Procedure: Lower Extremity Angiography;  Surgeon: Runell Gess, MD;  Location: [facility] INVASIVE CV LAB;  Service: Cardiovascular;  Laterality: N/A;  . PERIPHERAL VASCULAR CATHETERIZATION Left 09/28/2015   Procedure: Peripheral Vascular Intervention;  Surgeon: Runell Gess, MD;  Location: [facility] INVASIVE CV LAB;  Service: Cardiovascular;  Laterality: Left CFA  PCI with 9 mm x 4 cm Abbott nitinol absolute Pro self-expanding stent     . SKIN SPLIT GRAFT Left 12/01/2015   Procedure: LEFT FOOT SKIN GRAFT AND VAC;  Surgeon: [name], MD;  Location[facility] OR;  Service: Orthopedics;  Laterality: Left;  . THROMBECTOMY FEMORAL ARTERY Right 10/22/2016   Procedure: THROMBECTOMY FEMORAL ARTERY;  Surgeon: Chuck Hint, MD;  Location: [facility] OR;  Service: Vascular;  Laterality: Right;  . TRANSTHORACIC ECHOCARDIOGRAM  10/17/2009   EF 45-50%, moderate hypokinesis of the  entire inferolateral myocardium, mild concentric hypertrophy and mild regurg of the mitral valva.  Marland Kitchen VEIN HARVEST Left 11/02/2015   Procedure: LEFT GREATER SAPHENOUS VEIN HARVEST;  Surgeon: Chuck Hint, MD;  Location: [facility] OR;  Service: Vascular;  Laterality: Left;     OB History   No obstetric history on file.     Family History  Problem Relation Age of Onset  . Hypertension Mother   . Heart failure Mother   . Heart failure Father   . Stroke Father   . Diabetes Father     Social History   Tobacco Use  . Smoking status: Former Smoker    Packs/day: 1.50    Years: 41.00    Pack years: 61.50    Types: Cigarettes    Quit date: 10/12/2009    Years since quitting: 9.5  . Smokeless tobacco: Never Used  Substance Use Topics  . Alcohol use: No  . Drug use: No    Home Medications Prior to Admission medications   Medication Sig Start Date End Date Taking? Authorizing Provider  acetaminophen (TYLENOL) 325 MG tablet Take 650 mg by mouth every 6 (six) hours as needed for mild pain, fever or headache.    [provider]  apixaban (ELIQUIS) 5 MG TABS tablet Take 1 tablet (5 mg total) by mouth 2 (two) times daily. 03/25/19   Alessandra Bevels, MD  atorvastatin (LIPITOR) 80 MG tablet Take 80 mg by mouth at bedtime.     [provider]  clopidogrel (PLAVIX) 75 MG tablet Take 75 mg by mouth daily.     [provider]  docusate sodium (COLACE) 100 MG capsule Take 2 capsules (200 mg total) by mouth daily. 03/26/19   Alessandra Bevels, MD  furosemide (LASIX) 40 MG tablet Take 1 tablet (40 mg total) by mouth daily. 03/15/19   Joseph Art, DO  gabapentin (NEURONTIN) 300 MG capsule Take 1 capsule (300 mg total) by mouth at bedtime. Patient taking differently: Take 300 mg by mouth at bedtime as needed (for nerve pain).  05/13/18   Zannie Cove, MD  isosorbide mononitrate (IMDUR) 30 MG 24 hr tablet Take 3 tablets (90 mg total) by mouth daily. 03/25/19   Alessandra Bevels, MD  LEVEMIR FLEXTOUCH 100 UNIT/ML Pen Inject 35 Units into the skin at bedtime.  03/15/16   [provider]  meclizine (ANTIVERT) 12.5 MG tablet Take 1 tablet (12.5 mg total) by mouth 2 (two) times daily as needed for dizziness or nausea. 03/25/19   Alessandra Bevels, MD  metoprolol succinate (TOPROL-XL) 100 MG 24  hr tablet TAKE 1 AND 1/2 TABLETS BY MOUTH DAILY 03/25/19   Alessandra Bevels, MD  nitroGLYCERIN (NITROSTAT) 0.4 MG SL tablet Place 0.4 mg under the tongue every 5 (five) minutes as needed for chest pain.  05/15/18   [provider]  ondansetron (ZOFRAN) 4 MG tablet Take 1 tablet (4 mg total) by mouth daily as needed for nausea or vomiting. 03/25/19 03/24/20  Alessandra Bevels, MD  pantoprazole (PROTONIX) 40 MG tablet Take 1 tablet (40 mg total) by mouth daily. 12/21/18   Lennette Bihari, MD  VENTOLIN HFA 108 (90 Base) MCG/ACT inhaler Inhale 1 puff into the lungs every 4 (four) hours as needed for shortness of breath. 03/02/18   [provider]    Allergies    Doxycycline, Hydrochlorothiazide, Latex, and Penicillins  Review of Systems   Review of Systems  All other systems reviewed and are negative.   Physical Exam Updated Vital Signs BP 126/65 (BP Location: Right Arm)   Pulse 70   Temp (!) 97.4 F (36.3 C) (Oral)   Resp 14   SpO2 99%   Physical Exam Vitals and nursing note reviewed.  Constitutional:      General: She is not in acute distress.    Appearance: Normal appearance.  HENT:     Head: Normocephalic.     Right Ear: External ear normal.     Left Ear: External ear normal.     Nose: Nose normal.     Mouth/Throat:     Mouth: Mucous membranes are moist.  Eyes:     Pupils: Pupils are equal, round, and reactive to light.  Cardiovascular:     Rate and Rhythm: Normal rate.     Pulses: Normal pulses.     Heart sounds: Normal heart sounds.  Pulmonary:     Effort: Pulmonary effort is normal.     Comments: Patient is  tachypneic Abdominal:     General: Bowel sounds are normal.     Palpations: Abdomen is soft.     Tenderness: There is no abdominal tenderness.  Musculoskeletal:     Cervical back: Normal range of motion.     Comments: Right lower extremity with prior amputation of the great toe and partial foot No tenderness to palpation There is some erythema Left lower extremity significant for a large blister on the medial aspect of the left lower leg.  There is significant edema distal to this with associated erythema from the foot proximally halfway up the left lower leg.  There is some warmth and tenderness to palpation.  There is no crepitus and no gross purulent drainage.  Skin:    Capillary Refill: Capillary refill takes less than 2 seconds.  Neurological:     General: No focal deficit present.     Mental Status: She is alert.  Psychiatric:        Mood and Affect: Mood normal.     ED Results / Procedures / Treatments   Labs (all labs ordered are listed, but only abnormal results are displayed) Labs Reviewed  BASIC METABOLIC PANEL  CBC    EKG EKG Interpretation  Date/Time:  Saturday April 24 2019 17:45:42 EDT Ventricular Rate:  71 PR Interval:  154 QRS Duration: 114 QT Interval:  470 QTC Calculation: 510 R Axis:   101 Text Interpretation: Normal sinus rhythm Rightward axis Low voltage QRS Nonspecific ST and T wave abnormality Abnormal ECG Confirmed by Margarita Grizzle [id number]) on 04/24/2019 6:22:20 PM   Radiology DG Chest Port 1  View  Result Date: 04/24/2019 CLINICAL DATA:  Dyspnea. Bilateral leg edema. EXAM: PORTABLE CHEST 1 VIEW COMPARISON:  03/23/2019 FINDINGS: The heart size remains enlarged. Aortic calcifications are noted. There is vascular congestion with possible early pulmonary edema. There is a small right-sided pleural effusion. There is no focal infiltrate. There is probable atelectasis at the lung bases. IMPRESSION: Cardiomegaly with pulmonary vascular congestion and  possible early pulmonary edema. Small right pleural effusion. Electronically Signed   By: Katherine Mantle M.D.   On: 04/24/2019 18:33    Procedures Procedures (including critical care time)  Medications Ordered in ED Medications  sodium chloride flush (NS) 0.9 % injection 3 mL (has no administration in time range)    ED Course  I have reviewed the triage vital signs and the nursing notes.  Pertinent labs & imaging results that were available during my care of the patient were reviewed by me and considered in my medical decision making (see chart for details).    MDM Rules/Calculators/A&P                      Patient with swelling and pain in the left lower extremity.  Patient with lesion left lower extremity with surrounding erythema.  Patient being treated with IV antibiotics.  Plain x-rays ordered. D-dimer obtained to assess for DVT.  This is elevated at 2.34.  Patient is on Eliquis.  Care discussed with Dr. Adela Glimpse.  She will obtain ultrasound for DVT in a.m. Patient with normal white blood cell count but slightly elevated lactate level at 2.1.  Patient has received clindamycin to cover for cellulitis  Patient has some associated dyspnea.  She is somewhat orthopneic.  She has received 40 mg of Lasix here. Final Clinical Impression(s) / ED Diagnoses Final diagnoses:  Left leg pain  Cellulitis of left lower extremity  Elevated d-dimer    Rx / DC Orders ED Discharge Orders    None       Margarita Grizzle, MD 04/24/19 2042

## 2019-04-24 NOTE — ED Triage Notes (Signed)
Pt sent from [facility] for "fluid overload".  C/o blister on L lower leg x 2 days that is leaking clear fluid.  Reports bilateral leg pain.

## 2019-04-25 ENCOUNTER — Inpatient Hospital Stay (HOSPITAL_COMMUNITY): Payer: Medicare Other

## 2019-04-25 DIAGNOSIS — L0390 Cellulitis, unspecified: Secondary | ICD-10-CM

## 2019-04-25 LAB — CBG MONITORING, ED: Glucose-Capillary: 136 mg/dL — ABNORMAL HIGH (ref 70–99)

## 2019-04-25 LAB — CBC
HCT: 30.8 % — ABNORMAL LOW (ref 36.0–46.0)
Hemoglobin: 9.5 g/dL — ABNORMAL LOW (ref 12.0–15.0)
MCH: 24.1 pg — ABNORMAL LOW (ref 26.0–34.0)
MCHC: 30.8 g/dL (ref 30.0–36.0)
MCV: 78.2 fL — ABNORMAL LOW (ref 80.0–100.0)
Platelets: 333 10*3/uL (ref 150–400)
RBC: 3.94 MIL/uL (ref 3.87–5.11)
RDW: 17.2 % — ABNORMAL HIGH (ref 11.5–15.5)
WBC: 8.5 10*3/uL (ref 4.0–10.5)
nRBC: 0 % (ref 0.0–0.2)

## 2019-04-25 LAB — GLUCOSE, CAPILLARY
Glucose-Capillary: 119 mg/dL — ABNORMAL HIGH (ref 70–99)
Glucose-Capillary: 149 mg/dL — ABNORMAL HIGH (ref 70–99)
Glucose-Capillary: 150 mg/dL — ABNORMAL HIGH (ref 70–99)
Glucose-Capillary: 153 mg/dL — ABNORMAL HIGH (ref 70–99)
Glucose-Capillary: 158 mg/dL — ABNORMAL HIGH (ref 70–99)

## 2019-04-25 LAB — BASIC METABOLIC PANEL
Anion gap: 15 (ref 5–15)
BUN: 54 mg/dL — ABNORMAL HIGH (ref 8–23)
CO2: 18 mmol/L — ABNORMAL LOW (ref 22–32)
Calcium: 8.9 mg/dL (ref 8.9–10.3)
Chloride: 107 mmol/L (ref 98–111)
Creatinine, Ser: 2.08 mg/dL — ABNORMAL HIGH (ref 0.44–1.00)
GFR calc Af Amer: 27 mL/min — ABNORMAL LOW (ref >60)
GFR calc non Af Amer: 24 mL/min — ABNORMAL LOW (ref >60)
Glucose, Bld: 125 mg/dL — ABNORMAL HIGH (ref 70–99)
Potassium: 4.8 mmol/L (ref 3.5–5.1)
Sodium: 140 mmol/L (ref 135–145)

## 2019-04-25 LAB — TSH: TSH: 1.082 u[IU]/mL (ref 0.350–4.500)

## 2019-04-25 LAB — PHOSPHORUS: Phosphorus: 4.4 mg/dL (ref 2.5–4.6)

## 2019-04-25 LAB — HEMOGLOBIN A1C
Hgb A1c MFr Bld: 7.1 % — ABNORMAL HIGH (ref 4.8–5.6)
Mean Plasma Glucose: 157.07 mg/dL

## 2019-04-25 LAB — MAGNESIUM: Magnesium: 1.5 mg/dL — ABNORMAL LOW (ref 1.7–2.4)

## 2019-04-25 LAB — TROPONIN I (HIGH SENSITIVITY): Troponin I (High Sensitivity): 20 ng/L — ABNORMAL HIGH (ref <18)

## 2019-04-25 LAB — BRAIN NATRIURETIC PEPTIDE: B Natriuretic Peptide: 3075.3 pg/mL — ABNORMAL HIGH (ref 0.0–100.0)

## 2019-04-25 MED ORDER — SODIUM CHLORIDE 0.9% FLUSH
3.0000 mL | Freq: Two times a day (BID) | INTRAVENOUS | Status: DC
  Administered 2019-04-25 – 2019-04-27 (×6): 3 mL via INTRAVENOUS

## 2019-04-25 MED ORDER — ATORVASTATIN CALCIUM 80 MG PO TABS
80.0000 mg | ORAL_TABLET | Freq: Every day | ORAL | Status: DC
  Administered 2019-04-25 – 2019-04-26 (×2): 80 mg via ORAL
  Filled 2019-04-25 (×2): qty 1

## 2019-04-25 MED ORDER — ALBUTEROL SULFATE (2.5 MG/3ML) 0.083% IN NEBU: 3.0000 mL | INHALATION_SOLUTION | RESPIRATORY_TRACT | Status: DC | PRN

## 2019-04-25 MED ORDER — ONDANSETRON HCL 4 MG PO TABS: 4.0000 mg | ORAL_TABLET | Freq: Four times a day (QID) | ORAL | Status: DC | PRN

## 2019-04-25 MED ORDER — MAGNESIUM SULFATE 2 GM/50ML IV SOLN
2.0000 g | Freq: Once | INTRAVENOUS | Status: AC
  Administered 2019-04-25: 2 g via INTRAVENOUS
  Filled 2019-04-25: qty 50

## 2019-04-25 MED ORDER — ACETAMINOPHEN 325 MG PO TABS
650.0000 mg | ORAL_TABLET | Freq: Four times a day (QID) | ORAL | Status: DC | PRN
  Administered 2019-04-25: 650 mg via ORAL
  Filled 2019-04-25: qty 2

## 2019-04-25 MED ORDER — PANTOPRAZOLE SODIUM 40 MG PO TBEC
40.0000 mg | DELAYED_RELEASE_TABLET | Freq: Every day | ORAL | Status: DC
  Administered 2019-04-25 – 2019-04-27 (×3): 40 mg via ORAL
  Filled 2019-04-25 (×3): qty 1

## 2019-04-25 MED ORDER — FUROSEMIDE 10 MG/ML IJ SOLN
80.0000 mg | Freq: Two times a day (BID) | INTRAMUSCULAR | Status: DC
  Administered 2019-04-25 – 2019-04-27 (×6): 80 mg via INTRAVENOUS
  Filled 2019-04-25 (×6): qty 8

## 2019-04-25 MED ORDER — APIXABAN 5 MG PO TABS
5.0000 mg | ORAL_TABLET | Freq: Two times a day (BID) | ORAL | Status: DC
  Administered 2019-04-25 – 2019-04-27 (×5): 5 mg via ORAL
  Filled 2019-04-25 (×5): qty 1

## 2019-04-25 MED ORDER — METOPROLOL SUCCINATE ER 100 MG PO TB24
150.0000 mg | ORAL_TABLET | Freq: Every day | ORAL | Status: DC
  Administered 2019-04-25 – 2019-04-27 (×3): 150 mg via ORAL
  Filled 2019-04-25 (×3): qty 2

## 2019-04-25 MED ORDER — SODIUM CHLORIDE 0.9 % IV SOLN: 250.0000 mL | INTRAVENOUS | Status: DC | PRN

## 2019-04-25 MED ORDER — ISOSORBIDE MONONITRATE ER 30 MG PO TB24
90.0000 mg | ORAL_TABLET | Freq: Every day | ORAL | Status: DC
  Administered 2019-04-25 – 2019-04-27 (×3): 90 mg via ORAL
  Filled 2019-04-25 (×3): qty 3

## 2019-04-25 MED ORDER — ONDANSETRON HCL 4 MG/2ML IJ SOLN: 4.0000 mg | Freq: Four times a day (QID) | INTRAMUSCULAR | Status: DC | PRN

## 2019-04-25 MED ORDER — CLOPIDOGREL BISULFATE 75 MG PO TABS
75.0000 mg | ORAL_TABLET | Freq: Every day | ORAL | Status: DC
  Administered 2019-04-25 – 2019-04-27 (×3): 75 mg via ORAL
  Filled 2019-04-25 (×3): qty 1

## 2019-04-25 MED ORDER — HYDROCODONE-ACETAMINOPHEN 5-325 MG PO TABS
1.0000 | ORAL_TABLET | ORAL | Status: DC | PRN
  Filled 2019-04-25: qty 2

## 2019-04-25 MED ORDER — SODIUM CHLORIDE 0.9% FLUSH: 3.0000 mL | INTRAVENOUS | Status: DC | PRN

## 2019-04-25 MED ORDER — INSULIN DETEMIR 100 UNIT/ML ~~LOC~~ SOLN
30.0000 [IU] | Freq: Every day | SUBCUTANEOUS | Status: DC
  Administered 2019-04-25 – 2019-04-26 (×2): 30 [IU] via SUBCUTANEOUS
  Filled 2019-04-25 (×3): qty 0.3

## 2019-04-25 MED ORDER — ACETAMINOPHEN 650 MG RE SUPP: 650.0000 mg | Freq: Four times a day (QID) | RECTAL | Status: DC | PRN

## 2019-04-25 NOTE — Progress Notes (Signed)
PHARMACY - PHYSICIAN COMMUNICATION CRITICAL VALUE ALERT - BLOOD CULTURE IDENTIFICATION (BCID)  [name] [name] is an 69 y.o. female who presented to [facility] on 04/24/2019 with a chief complaint of leg pain  Assessment:  2 sets of blood cultures drawn, 1 aerobic bottle with GPC in clusters.  Name of physician (or Provider) Contacted: n/a  Current antibiotics: cefazolin  Changes to prescribed antibiotics recommended:  None, likely contaminant.  No results found for this or any previous visit.   Jenetta Downer, [facility] Clinical Pharmacist Phone [phone]  04/25/2019 8:25 PM

## 2019-04-25 NOTE — Evaluation (Signed)
Physical Therapy Evaluation Patient Details Name: [name] [name] MRN: [id number] DOB: Jun 01, 1950 Today's Date: 04/25/2019   History of Present Illness  Pt is 69 y.o. female with PMHx including DM2, HTN, CKD, CAD, PVD, combined systolic and diastolic heart failure, CVA, COPD who presented with 2 day history of blister and increased bil LE edema (worse on LLE). Pt admitted with possible L leg cellulitis.   Clinical Impression  Pt presents to PT with deficits in functional mobility, gait, balance, power, strength, endurance, and with significant pain in BLE. Pt is limited by pain and difficulty WB through LEs due to blisters. Pt currently requires PT assistance to ambulate very short distances. Pt adamant about discharging home and declines HHPT at this time due to her dog. If pt is unable to progress gait prior to discharge then the pt will benefit from receiving a wheelchair to mobilize in the home setting, as she is unable to ambulate for functional household distances at this time.    Follow Up Recommendations Supervision for mobility/OOB(pt refusing HHPT and SNF)    Equipment Recommendations  Wheelchair (measurements PT)(pt will need manual wheelchair if unable to progress gait)    Recommendations for Other Services       Precautions / Restrictions Precautions Precautions: Fall Precaution Comments: LE edema, blister Restrictions Weight Bearing Restrictions: No      Mobility  Bed Mobility Overal bed mobility: Needs Assistance Bed Mobility: Supine to Sit;Sit to Supine     Supine to sit: Supervision Sit to supine: Min assist      Transfers Overall transfer level: Needs assistance   Transfers: Sit to/from Stand Sit to Stand: Min guard            Ambulation/Gait Ambulation/Gait assistance: Min guard Gait Distance (Feet): 8 Feet Assistive device: Rolling walker (2 wheeled) Gait Pattern/deviations: Step-to pattern Gait velocity: reduced Gait velocity interpretation:  <1.31 ft/sec, indicative of household ambulator General Gait Details: step to gait antalgic on LLE, reduced step length with RLE. limited tolerance 2/2 pain  Stairs            Wheelchair Mobility    Modified Rankin (Stroke Patients Only)       Balance Overall balance assessment: Needs assistance Sitting-balance support: No upper extremity supported;Feet supported Sitting balance-Leahy Scale: Good Sitting balance - Comments: supervision   Standing balance support: Bilateral upper extremity supported Standing balance-Leahy Scale: Fair Standing balance comment: minG with BUE support of RW                             Pertinent Vitals/Pain Pain Assessment: Faces Faces Pain Scale: Hurts whole lot Pain Location: BLE with WB likely 2/2 blisters Pain Descriptors / Indicators: Sore Pain Intervention(s): Limited activity within patient's tolerance    Home Living Family/patient expects to be discharged to:: Private residence Living Arrangements: Children Available Help at Discharge: Family Type of Home: House Home Access: Stairs to enter;Ramped entrance Entrance Stairs-Rails: Left Entrance Stairs-Number of Steps: 3 Home Layout: One level Home Equipment: Walker - 2 wheels;Walker - 4 wheels;Cane - single point;Bedside commode;Tub bench      Prior Function Level of Independence: Needs assistance   Gait / Transfers Assistance Needed: ambulates with Rollator  ADL's / Homemaking Assistance Needed: daughter assists with bathing and dressing, pt reports able to toilet without assist   Comments: reports she lives with her daughter who works part time      Higher education careers adviser   Dominant Hand:  Right    Extremity/Trunk Assessment   Upper Extremity Assessment Upper Extremity Assessment: Defer to OT evaluation    Lower Extremity Assessment Lower Extremity Assessment: Generalized weakness(BLE edema, LLE wrapped due to blisters)    Cervical / Trunk  Assessment Cervical / Trunk Assessment: Kyphotic  Communication   Communication: No difficulties  Cognition Arousal/Alertness: Awake/alert Behavior During Therapy: WFL for tasks assessed/performed Overall Cognitive Status: Within Functional Limits for tasks assessed                                        General Comments General comments (skin integrity, edema, etc.): VSS on RA, pt limited by pain in BLE    Exercises     Assessment/Plan    PT Assessment Patient needs continued PT services  PT Problem List Decreased strength;Decreased activity tolerance;Decreased balance;Decreased mobility;Decreased knowledge of use of DME;Decreased safety awareness;Decreased knowledge of precautions;Pain       PT Treatment Interventions DME instruction;Gait training;Stair training;Functional mobility training;Therapeutic activities;Therapeutic exercise;Balance training;Neuromuscular re-education;Patient/family education;Wheelchair mobility training    PT Goals (Current goals can be found in the Care Plan section)  Acute Rehab PT Goals Patient Stated Goal: to go home PT Goal Formulation: With patient Time For Goal Achievement: 05/09/19 Potential to Achieve Goals: Fair Additional Goals Additional Goal #1: Pt will maintain dynamic standing balance within 10 inches of her base of support with unilateral UE support and supervision. Additional Goal #2: pt will mobilize in manual wheelchair for 50' with supervision and use of BUEs.    Frequency Min 3X/week   Barriers to discharge        Co-evaluation               AM-PAC PT "6 Clicks" Mobility  Outcome Measure Help needed turning from your back to your side while in a flat bed without using bedrails?: None Help needed moving from lying on your back to sitting on the side of a flat bed without using bedrails?: None Help needed moving to and from a bed to a chair (including a wheelchair)?: A Little Help needed standing up  from a chair using your arms (e.g., wheelchair or bedside chair)?: A Little Help needed to walk in hospital room?: A Lot Help needed climbing 3-5 steps with a railing? : Total 6 Click Score: 17    End of Session   Activity Tolerance: Patient limited by pain Patient left: in bed;with call bell/phone within reach;with bed alarm set Nurse Communication: Mobility status PT Visit Diagnosis: Other abnormalities of gait and mobility (R26.89);Unsteadiness on feet (R26.81)    Time: 4742-5956 PT Time Calculation (min) (ACUTE ONLY): 18 min   Charges:   PT Evaluation $PT Eval Moderate Complexity: 1 Mod          Zenaida Niece, PT, DPT Acute Rehabilitation Pager: [phone]   Zenaida Niece 04/25/2019, 3:04 PM

## 2019-04-25 NOTE — Evaluation (Addendum)
Occupational Therapy Evaluation Patient Details Name: [name] [name] MRN: [id number] DOB: 11/10/1950 Today's Date: 04/25/2019    History of Present Illness Pt is 69 y.o. female with PMHx including DM2, HTN, CKD, CAD, PVD, combined systolic and diastolic heart failure, CVA, COPD who presented with 2 day history of blister and increased bil LE edema (worse on LLE). Pt admitted with possible L leg cellulitis.    Clinical Impression   This 69 y/o female presents with the above. PTA pt reports using rollator for functional mobility within her home, reports daughter assists with some ADL including bathing/dressing; pt reports she lives with daughter who works part time. Today pt presenting with weakness, decreased activity tolerance and notable bil LE edema. Pt sleepy initially and requiring encouragement to participate in therapy session. Pt completing bed mobility with modA, tolerating sitting EOB at supervision level for static balance; pt declined attempts at mobility beyond EOB today due to fatigue/weakness. Noted pt with increased DOE (grossly 2/4 with minimal activity today including bed mobility and intermittently with speaking). She will benefit from continued acute OT services and given current deficits recommend SNF level therapies at time of discharge (pending pt progress/participation with mobility) to progress pt towards her PLOF. Will follow.     Follow Up Recommendations  SNF;Supervision/Assistance - 24 hour(pending progress)    Equipment Recommendations  None recommended by OT           Precautions / Restrictions Precautions Precautions: Fall Precaution Comments: LE edema, LLE wrapped (d/t blister, weeping) Restrictions Weight Bearing Restrictions: No      Mobility Bed Mobility Overal bed mobility: Needs Assistance Bed Mobility: Supine to Sit;Sit to Supine     Supine to sit: Mod assist Sit to supine: Mod assist   General bed mobility comments: assist for LEs to/from  EOB, increased time/effort to scoot hips   Transfers                 General transfer comment: pt declined attempts, reports too fatigued    Balance Overall balance assessment: Needs assistance Sitting-balance support: Feet unsupported;Single extremity supported;Bilateral upper extremity supported Sitting balance-Leahy Scale: Fair                                     ADL either performed or assessed with clinical judgement   ADL Overall ADL's : Needs assistance/impaired Eating/Feeding: Set up;Sitting   Grooming: Set up;Supervision/safety;Sitting   Upper Body Bathing: Supervision/ safety;Set up;Sitting   Lower Body Bathing: Maximal assistance;Total assistance;Sitting/lateral leans;Bed level   Upper Body Dressing : Sitting;Set up;Min guard   Lower Body Dressing: Maximal assistance;Total assistance;Sitting/lateral leans;Bed level       Toileting- Clothing Manipulation and Hygiene: Total assistance;Bed level         General ADL Comments: pt sleepy this session and required encouragement to participate, presenting with weakness, DOE with minimal activity, tolerated sitting EOB approx 5-6 min during session                          Pertinent Vitals/Pain Pain Assessment: Faces Faces Pain Scale: Hurts a little bit Pain Location: cramps in bil LEs Pain Descriptors / Indicators: Cramping Pain Intervention(s): Limited activity within patient's tolerance;Monitored during session;Repositioned     Hand Dominance Right   Extremity/Trunk Assessment Upper Extremity Assessment Upper Extremity Assessment: Generalized weakness;RUE deficits/detail;LUE deficits/detail RUE Deficits / Details: limited AROM in bil shoulders (grossly  0-90*), gross weakness throughout  RUE Coordination: decreased gross motor LUE Deficits / Details: limited AROM in bil shoulders (grossly 0-90*), gross weakness throughout  LUE Coordination: decreased gross motor   Lower  Extremity Assessment Lower Extremity Assessment: Defer to PT evaluation(gauze/wrapped LLE, weeping)       Communication Communication Communication: No difficulties   Cognition Arousal/Alertness: Lethargic;Awake/alert(sleepy) Behavior During Therapy: WFL for tasks assessed/performed Overall Cognitive Status: Within Functional Limits for tasks assessed                                 General Comments: appears [facility] for basic tasks, requires encouragement to participate and to remain awake as pt very sleepy this AM   General Comments       Exercises     Shoulder Instructions      Home Living Family/patient expects to be discharged to:: Private residence Living Arrangements: Children(daughter) Available Help at Discharge: Family Type of Home: House Home Access: Stairs to enter;Ramped entrance Entrance Stairs-Number of Steps: 3 Entrance Stairs-Rails: Left Home Layout: One level     Bathroom Shower/Tub: Chief Strategy Officer: Handicapped height     Home Equipment: Environmental consultant - 2 wheels;Walker - 4 wheels;Cane - single point;Bedside commode;Tub bench          Prior Functioning/Environment Level of Independence: Needs assistance  Gait / Transfers Assistance Needed: using rollator in the house ADL's / Homemaking Assistance Needed: daughter assists with bathing and dressing, pt reports able to toilet without assist    Comments: reports she lives with her daughter who works part time         OT Problem List: Decreased strength;Decreased range of motion;Decreased activity tolerance;Impaired balance (sitting and/or standing);Pain;Increased edema;Decreased knowledge of use of DME or AE;Decreased knowledge of precautions;Cardiopulmonary status limiting activity      OT Treatment/Interventions: Self-care/ADL training;Therapeutic exercise;Energy conservation;DME and/or AE instruction;Therapeutic activities;Patient/family education;Balance training    OT  Goals(Current goals can be found in the care plan section) Acute Rehab OT Goals Patient Stated Goal: return home OT Goal Formulation: With patient Time For Goal Achievement: 05/09/19 Potential to Achieve Goals: Good  OT Frequency: Min 2X/week   Barriers to D/C:            Co-evaluation              AM-PAC OT "6 Clicks" Daily Activity     Outcome Measure Help from another person eating meals?: A Little Help from another person taking care of personal grooming?: A Little Help from another person toileting, which includes using toliet, bedpan, or urinal?: Total Help from another person bathing (including washing, rinsing, drying)?: A Lot Help from another person to put on and taking off regular upper body clothing?: A Little Help from another person to put on and taking off regular lower body clothing?: Total 6 Click Score: 13   End of Session Nurse Communication: Mobility status  Activity Tolerance: Patient limited by fatigue;Patient limited by lethargy Patient left: in bed;with call bell/phone within reach;with bed alarm set  OT Visit Diagnosis: Muscle weakness (generalized) (M62.81);Other abnormalities of gait and mobility (R26.89)                Time: 7867-5449 OT Time Calculation (min): 14 min Charges:  OT General Charges $OT Visit: 1 Visit OT Evaluation $OT Eval Moderate Complexity: 1 Mod  [name] [name], OT Acute Rehabilitation Services Pager [phone] Office [phone]   Orlando Penner  04/25/2019, 10:13 AM

## 2019-04-25 NOTE — Progress Notes (Addendum)
PROGRESS NOTE    [name] [name]  DZH:[id number] DOB: [birth year] DOA: 04/24/2019 PCP: Georgann Housekeeper, MD   Brief Narrative:  Patient is a 68 year old female with history of diabetes type 2, hypertension, CKD stage IIIa, coronary artery disease, peripheral vascular disease, combined systolic/diastolic CHF, CVA, hyperlipidemia, COPD who presented with 2 days history of increased bilateral lower extremity edema, worse on the left.  She has also experienced worsening shortness of breath on exertion, weight gain.  On presentation, she was found to have AKI, elevated BNP, severe bilateral lower extremity edema.  Admitted for the management of acute on chronic congestive heart failure.  Also suspected to have cellulitis of lower extremities and has been started on antibiotics.  Assessment & Plan:   Active Problems:   PAD (peripheral artery disease) (HCC)   Coronary artery disease involving native coronary artery of native heart without angina pectoris   Diabetes mellitus (HCC)   Diabetes mellitus type II, uncontrolled (HCC)   Hyperlipidemia with target LDL less than 70   PVD (peripheral vascular disease) with claudication (HCC)   CVA (cerebral vascular accident) (HCC)   Acute on chronic systolic CHF (congestive heart failure) (HCC)   GERD (gastroesophageal reflux disease)   Benign hypertension   CKD (chronic kidney disease) stage 3, GFR 30-59 ml/min   CHF exacerbation (HCC)   Chronic a-fib (HCC)   Cellulitis   Acute on chronic combined congestive heart failure: Presented with worsening bilateral lower extremity edema,DOE.  Echocardiogram as per 2/21 showed ejection fraction of 25 to 30%, global hypokinesis, grade 3 diastolic dysfunction.  Takes Lasix 80 mg daily at home. Started on IV Lasix.  Continue current Lasix dose.  Continue to monitor input/output, daily weight.  She follows with cardiology Elevated BNP on presentation.  Bilateral lower extremity edema/suspected cellulitis: Started on  Ancef.  Had a big bulla on left   lower extremities.  Venous Doppler to rule out DVT ordered.  History of coronary artery disease: Denies any chest pain.  Continue current home medications.  Minimal elevation of troponin with flat trend.  She follows with Dr. Tresa Endo  History of peripheral artery disease: On Plavix  Hyperlipidemia: Continue statin  Diabetes type 2: Continue current insulin regimen.  History of CVA: On Plavix and statin at home which we will continue.  CKD stage IIIa: Creatinine up from baseline.  Baseline creatinine around 1.5. we will follow-up renal function closely with diuresis. She follows with Dr. Malen Gauze  Hypertension: Currently blood pressure stable.  Continue current medications  Chronic A. fib: Currently rate is controlled.  On Toprol.  On Eliquis for anticoagulation.  Hypomagnesemia: Supplemented           DVT prophylaxis:Eliquis Code Status: Full Family Communication: Called daughter on phone for update on 04/25/19  Disposition Plan: Patient is from home.  Expect discharge disposition to home after adequate diuresis, improvement in the cellulitis, lower extremity edema  Consultants: None Procedures:None  Antimicrobials:  Anti-infectives (From admission, onward)   Start     Dose/Rate Route Frequency Ordered Stop   04/24/19 2300  ceFAZolin (ANCEF) IVPB 2g/100 mL premix     2 g 200 mL/hr over 30 Minutes Intravenous Every 12 hours 04/24/19 2225     04/24/19 1930  clindamycin (CLEOCIN) IVPB 600 mg     600 mg 100 mL/hr over 30 Minutes Intravenous  Once 04/24/19 1919 04/24/19 2030      Subjective:  Patient seen and examined at the bedside this morning.  Feels much better today.  Her lower extremities edema and cellulitis have been improving with diuretics and antibiotics.  Denied any shortness of breath.  Objective: Vitals:   04/25/19 0045 04/25/19 0128 04/25/19 0344 04/25/19 0528  BP: (!) 161/62 (!) 153/74  (!) 147/74  Pulse: 70 71  78    Resp: (!) 29 20  19   Temp:  98.3 F (36.8 C)  98.1 F (36.7 C)  TempSrc:  Oral  Oral  SpO2: 97% 96%  100%  Weight:   76.8 kg     Intake/Output Summary (Last 24 hours) at 04/25/2019 0818 Last data filed at 04/25/2019 0534 Gross per 24 hour  Intake 406.67 ml  Output 400 ml  Net 6.67 ml   Filed Weights   04/24/19 1854 04/25/19 0344  Weight: 81.6 kg 76.8 kg    Examination:  General exam: Appears calm and comfortable ,Not in distress,average built Respiratory system: Bilateral equal air entry, normal vesicular breath sounds, no wheezes or crackles  Cardiovascular system: Irregular  rhythm. No JVD, murmurs, rubs, gallops or clicks. Gastrointestinal system: Abdomen is nondistended, soft and nontender. No organomegaly or masses felt. Normal bowel sounds heard. Central nervous system: Alert and oriented. No focal neurological deficits. Extremities: 2-3+ bilateral lower extremity edema, amputations of the small toes on the right Skin: Erythematous changes of the bilateral lower extremities, large bullae on the medial side of the left lower extremity, no ulcers    Data Reviewed: I have personally reviewed following labs and imaging studies  CBC: Recent Labs  Lab 04/24/19 1745 04/25/19 0233  WBC 8.9 8.5  HGB 9.6* 9.5*  HCT 31.4* 30.8*  MCV 78.3* 78.2*  PLT 364 333   Basic Metabolic Panel: Recent Labs  Lab 04/24/19 1745 04/24/19 2100 04/25/19 0233  NA 139  --   --   K 5.2*  --   --   CL 106  --   --   CO2 21*  --   --   GLUCOSE 126*  --   --   BUN 51*  --   --   CREATININE 2.20*  --   --   CALCIUM 8.9  --   --   MG  --  1.5* 1.5*  PHOS  --   --  4.4   GFR: Estimated Creatinine Clearance: 24.2 mL/min (A) (by C-G formula based on SCr of 2.2 mg/dL (H)). Liver Function Tests: No results for input(s): AST, ALT, ALKPHOS, BILITOT, PROT, ALBUMIN in the last 168 hours. No results for input(s): LIPASE, AMYLASE in the last 168 hours. No results for input(s): AMMONIA in the  last 168 hours. Coagulation Profile: No results for input(s): INR, PROTIME in the last 168 hours. Cardiac Enzymes: No results for input(s): CKTOTAL, CKMB, CKMBINDEX, TROPONINI in the last 168 hours. BNP (last 3 results) No results for input(s): PROBNP in the last 8760 hours. HbA1C: Recent Labs    04/25/19 0233  HGBA1C 7.1*   CBG: Recent Labs  Lab 04/24/19 2249 04/25/19 0022 04/25/19 0414  GLUCAP 135* 136* 119*   Lipid Profile: No results for input(s): CHOL, HDL, LDLCALC, TRIG, CHOLHDL, LDLDIRECT in the last 72 hours. Thyroid Function Tests: Recent Labs    04/25/19 0233  TSH 1.082   Anemia Panel: No results for input(s): VITAMINB12, FOLATE, FERRITIN, TIBC, IRON, RETICCTPCT in the last 72 hours. Sepsis Labs: Recent Labs  Lab 04/24/19 1848  LATICACIDVEN 2.1*    Recent Results (from the past 240 hour(s))  Blood culture (routine x 2)     Status:  None (Preliminary result)   Collection Time: 04/24/19  6:45 PM   Specimen: BLOOD  Result Value Ref Range Status   Specimen Description BLOOD BLOOD RIGHT FOREARM  Final   Special Requests   Final    BOTTLES DRAWN AEROBIC AND ANAEROBIC Blood Culture adequate volume   Culture   Final    NO GROWTH < 12 HOURS Performed at [facility], [address] [place], [place] [address]    Report Status PENDING  Incomplete  Blood culture (routine x 2)     Status: None (Preliminary result)   Collection Time: 04/24/19  6:48 PM   Specimen: BLOOD  Result Value Ref Range Status   Specimen Description BLOOD RIGHT ANTECUBITAL  Final   Special Requests   Final    BOTTLES DRAWN AEROBIC AND ANAEROBIC Blood Culture adequate volume   Culture   Final    NO GROWTH < 12 HOURS Performed at [facility], [place] [address], [place], [place] [address]    Report Status PENDING  Incomplete  SARS CORONAVIRUS 2 (TAT 6-24 HRS) Nasopharyngeal Nasopharyngeal Swab     Status: None   Collection Time: 04/24/19  6:48 PM   Specimen: Nasopharyngeal  Swab  Result Value Ref Range Status   SARS Coronavirus 2 NEGATIVE NEGATIVE Final    Comment: (NOTE) SARS-CoV-2 target nucleic acids are NOT DETECTED. The SARS-CoV-2 RNA is generally detectable in upper and lower respiratory specimens during the acute phase of infection. Negative results do not preclude SARS-CoV-2 infection, do not rule out co-infections with other pathogens, and should not be used as the sole basis for treatment or other patient management decisions. Negative results must be combined with clinical observations, patient history, and epidemiological information. The expected result is Negative. Fact Sheet for Patients: SugarRoll.be Fact Sheet for Healthcare Providers: https://www.woods-mathews.com/ This test is not yet approved or cleared by the [place] FDA and  has been authorized for detection and/or diagnosis of SARS-CoV-2 by FDA under an Emergency Use Authorization (EUA). This EUA will remain  in effect (meaning this test can be used) for the duration of the COVID-19 declaration under Section 56 4(b)(1) of the Act, 21 U.S.C. section 360bbb-3(b)(1), unless the authorization is terminated or revoked sooner. Performed at [facility], [place] [address] [place],  [address]          Radiology Studies: DG Tibia/Fibula Left  Result Date: 04/24/2019 CLINICAL DATA:  Swelling and rash EXAM: LEFT TIBIA AND FIBULA - 2 VIEW COMPARISON:  None. FINDINGS: There is diffuse osteopenia. No fracture or dislocation. There is diffuse subcutaneous edema seen surrounding the lower extremity. No subcutaneous emphysema seen within the soft tissues. Overlying skin folds are seen within the medial aspect of the lower extremity. Surgical clips and dense vascular calcifications are noted. IMPRESSION: Diffuse subcutaneous edema, no definite emphysema within the soft tissues. Electronically Signed   By: Prudencio Pair M.D.   On: 04/24/2019  21:02   DG Chest Port 1 View  Result Date: 04/24/2019 CLINICAL DATA:  Dyspnea. Bilateral leg edema. EXAM: PORTABLE CHEST 1 VIEW COMPARISON:  03/23/2019 FINDINGS: The heart size remains enlarged. Aortic calcifications are noted. There is vascular congestion with possible early pulmonary edema. There is a small right-sided pleural effusion. There is no focal infiltrate. There is probable atelectasis at the lung bases. IMPRESSION: Cardiomegaly with pulmonary vascular congestion and possible early pulmonary edema. Small right pleural effusion. Electronically Signed   By: Constance Holster M.D.   On: 04/24/2019  18:33        Scheduled Meds: . apixaban  5 mg Oral BID  . atorvastatin  80 mg Oral QHS  . clopidogrel  75 mg Oral Daily  . furosemide  80 mg Intravenous BID  . insulin aspart  0-9 Units Subcutaneous Q4H  . insulin detemir  30 Units Subcutaneous QHS  . isosorbide mononitrate  90 mg Oral Daily  . metoprolol succinate  150 mg Oral Daily  . pantoprazole  40 mg Oral Daily  . sodium chloride flush  3 mL Intravenous Once  . sodium chloride flush  3 mL Intravenous Q12H   Continuous Infusions: . sodium chloride    .  ceFAZolin (ANCEF) IV Stopped (04/24/19 2328)     LOS: 1 day    Time spent: 35 mins.More than 50% of that time was spent in counseling and/or coordination of care.      Burnadette Pop, MD [facility] P3/28/2021, 8:18 AM

## 2019-04-25 NOTE — Progress Notes (Signed)
VASCULAR LAB PRELIMINARY  PRELIMINARY  PRELIMINARY  PRELIMINARY  Left lower extremity venous completed.    Preliminary report:  See CV proc for preliminary results.   Minaal Struckman, RVT 04/25/2019, 12:16 PM

## 2019-04-26 ENCOUNTER — Encounter (HOSPITAL_COMMUNITY): Payer: Self-pay | Admitting: Internal Medicine

## 2019-04-26 DIAGNOSIS — L02619 Cutaneous abscess of unspecified foot: Secondary | ICD-10-CM

## 2019-04-26 DIAGNOSIS — L03119 Cellulitis of unspecified part of limb: Secondary | ICD-10-CM

## 2019-04-26 HISTORY — DX: Cellulitis of unspecified part of limb: L03.119

## 2019-04-26 HISTORY — DX: Cutaneous abscess of unspecified foot: L02.619

## 2019-04-26 LAB — BASIC METABOLIC PANEL
Anion gap: 13 (ref 5–15)
BUN: 47 mg/dL — ABNORMAL HIGH (ref 8–23)
CO2: 23 mmol/L (ref 22–32)
Calcium: 9 mg/dL (ref 8.9–10.3)
Chloride: 105 mmol/L (ref 98–111)
Creatinine, Ser: 1.79 mg/dL — ABNORMAL HIGH (ref 0.44–1.00)
GFR calc Af Amer: 33 mL/min — ABNORMAL LOW (ref >60)
GFR calc non Af Amer: 28 mL/min — ABNORMAL LOW (ref >60)
Glucose, Bld: 108 mg/dL — ABNORMAL HIGH (ref 70–99)
Potassium: 4.1 mmol/L (ref 3.5–5.1)
Sodium: 141 mmol/L (ref 135–145)

## 2019-04-26 LAB — MAGNESIUM: Magnesium: 2.2 mg/dL (ref 1.7–2.4)

## 2019-04-26 LAB — GLUCOSE, CAPILLARY
Glucose-Capillary: 109 mg/dL — ABNORMAL HIGH (ref 70–99)
Glucose-Capillary: 137 mg/dL — ABNORMAL HIGH (ref 70–99)
Glucose-Capillary: 139 mg/dL — ABNORMAL HIGH (ref 70–99)
Glucose-Capillary: 168 mg/dL — ABNORMAL HIGH (ref 70–99)
Glucose-Capillary: 170 mg/dL — ABNORMAL HIGH (ref 70–99)
Glucose-Capillary: 205 mg/dL — ABNORMAL HIGH (ref 70–99)

## 2019-04-26 MED ORDER — CEFAZOLIN SODIUM-DEXTROSE 2-4 GM/100ML-% IV SOLN
2.0000 g | Freq: Two times a day (BID) | INTRAVENOUS | Status: DC
  Administered 2019-04-26: 2 g via INTRAVENOUS
  Filled 2019-04-26 (×2): qty 100

## 2019-04-26 MED ORDER — CEPHALEXIN 500 MG PO CAPS
500.0000 mg | ORAL_CAPSULE | Freq: Three times a day (TID) | ORAL | Status: DC
  Administered 2019-04-26 – 2019-04-27 (×2): 500 mg via ORAL
  Filled 2019-04-26 (×2): qty 1

## 2019-04-26 MED ORDER — CEPHALEXIN 500 MG PO CAPS
500.0000 mg | ORAL_CAPSULE | Freq: Two times a day (BID) | ORAL | Status: DC
  Filled 2019-04-26: qty 1

## 2019-04-26 NOTE — Progress Notes (Signed)
Pharmacy Antibiotic Note  [name] [name] is a 69 y.o. female admitted on 04/24/2019 with + blood cultures.  Pharmacy has been consulted for Ancef dosing.  ID: Bulla on left lower extremity. Afebrile. WBC WNL. CrCl 29  Cefazolin 3/27 >>   3/27: BC x 2>>GPC 3/27: COVID negative   Plan: Ancef 2g IV q12h until cultures finalize    Weight: 169 lb 5 oz (76.8 kg)  Temp (24hrs), Avg:97.9 F (36.6 C), Min:97.6 F (36.4 C), Max:98.1 F (36.7 C)  Recent Labs  Lab 04/24/19 1745 04/24/19 1848 04/25/19 0233 04/26/19 0502  WBC 8.9  --  8.5  --   CREATININE 2.20*  --  2.08* 1.79*  LATICACIDVEN  --  2.1*  --   --     Estimated Creatinine Clearance: 29.7 mL/min (A) (by C-G formula based on SCr of 1.79 mg/dL (H)).    Allergies  Allergen Reactions  . Doxycycline Other (See Comments)    Lethargy   . Hydrochlorothiazide Other (See Comments)    Lethargy   . Latex Rash  . Penicillins Swelling and Rash    Pt states she has tolerated Keflex in the past without problems. States she may have tolerated Augmentin in the past but it caused GI upset. Has patient had a PCN reaction causing immediate rash, facial/tongue/throat swelling, SOB or lightheadedness with hypotension: Yes Has patient had a PCN reaction causing severe rash involving mucus membranes or skin necrosis: No Has patient had a PCN reaction that required hospitalization No Has patient had a PCN reaction occurring within the last 10 years: No    [name] [name] S. [name] [name], PharmD, BCPS Clinical Staff Pharmacist [email or web address]  [name] [name] 04/26/2019 9:04 AM

## 2019-04-26 NOTE — Care Management (Signed)
    Durable Medical Equipment  (From admission, onward)         Start     Ordered   04/26/19 0753  For home use only DME lightweight manual wheelchair with seat cushion  Once    Comments: Patient suffers from CHF, Bilateral lower extremity edema/suspected cellulitis which impairs their ability to perform daily activities like ambulating  in the home.  A cane will not resolve  issue with performing activities of daily living. A wheelchair will allow patient to safely perform daily activities. Patient is not able to propel themselves in the home using a standard weight wheelchair due to CHF, Bilateral lower extremity edema/suspected cellulitis . Patient can self propel in the lightweight wheelchair. Length of need lifetime  Accessories: elevating leg rests (ELRs), wheel locks, extensions and anti-tippers.  Seat and back cushions   04/26/19 0753

## 2019-04-26 NOTE — TOC Initial Note (Signed)
Transition of Care [facility]) - Initial/Assessment Note    Patient Details  Name: [name] [name] MRN: [id number] Date of Birth: [birth year]  [facility]) CM/SW Contact:    Marilu Favre, RN Phone Number: 04/26/2019, 11:58 AM  Clinical Narrative:                  Patient from home with daughter.   Discussed PT recommendation for SNF or HH. Patient voices understanding and refusing both.   PT also recommending wheel chair. Patient wants to know cost and size before she agrees to wheel chair. Ordered wheel chair Zack with [facility] will call patient directly to discuss cost and size, at that point patient will decide if she wants wheel chair or not. Expected Discharge Plan: Home/Self Care Barriers to Discharge: Continued Medical Work up   Patient Goals and CMS Choice Patient states their goals for this hospitalization and ongoing recovery are:: to go home CMS Medicare.gov Compare Post Acute Care list provided to:: Patient Choice offered to / list presented to : Patient  Expected Discharge Plan and Services Expected Discharge Plan: Home/Self Care     Post Acute Care Choice: Home Health, Durable Medical Equipment Living arrangements for the past 2 months: Single Family Home                 DME Arranged: Wheelchair manual DME Agency: [employer] Date DME Agency Contacted: 04/26/19   Representative spoke with at DME Agency: [place]: Refused SNF, Patient Refused HH          Prior Living Arrangements/Services Living arrangements for the past 2 months: [place] Lives with:: Adult Children Patient language and need for interpreter reviewed:: Yes Do you feel safe going back to the place where you live?: Yes      Need for Family Participation in Patient Care: Yes (Comment) Care giver support system in place?: Yes (comment) Current home services: DME Criminal Activity/Legal Involvement Pertinent to Current Situation/Hospitalization: No - Comment  as needed  Activities of Daily Living      Permission Sought/Granted   Permission granted to share information with : No              Emotional Assessment Appearance:: Appears stated age Attitude/Demeanor/Rapport: Engaged Affect (typically observed): Accepting Orientation: : Oriented to Self, Oriented to Place, Oriented to  Time, Oriented to Situation Alcohol / Substance Use: Not Applicable Psych Involvement: No (comment)  Admission diagnosis:  Left leg pain [M79.605] CHF exacerbation (HCC) [I50.9] Cellulitis of left lower extremity [N46.270] Elevated d-dimer [R79.89] Patient Active Problem List   Diagnosis Date Noted  . CHF exacerbation ([place]) 04/24/2019  . Chronic a-fib ([place]) 04/24/2019  . Cellulitis 04/24/2019  . AKI (acute kidney injury) ([place]) 03/25/2019  . Vertigo 03/25/2019  . Atrial fibrillation with RVR ([place]) 03/20/2019  . Hyperkalemia   . Dyspnea 03/11/2019  . Elevated troponin 03/11/2019  . Chest pain 03/11/2019  . Acute on chronic combined systolic and diastolic CHF (congestive heart failure) ([place]) 05/08/2018  . Renal insufficiency 02/13/2018  . CKD (chronic kidney disease) stage 3, GFR 30-59 ml/min 02/13/2018  . Acute systolic CHF (congestive heart failure) ([place]) 02/13/2018  . Benign hypertension 11/26/2017  . Gangrene of right foot ([place]) 11/01/2016  . GERD (gastroesophageal reflux disease) 11/01/2016  . Gangrene of left foot ([place]) 10/17/2016  . Sepsis ([place]) 10/17/2016  . Midfoot ulcer, left, limited to breakdown of skin ([place]) 01/08/2016  . Thigh hematoma 12/22/2015  . History of blood  transfusion 12/22/2015  . Acute on chronic systolic CHF (congestive heart failure) (HCC) 12/22/2015  . H/O skin graft 12/11/2015  . SEMI (subendocardial myocardial infarction) (HCC) 11/19/2015  . DM type 2 with diabetic peripheral neuropathy (HCC) 11/19/2015  . Traumatic hemorrhagic shock (HCC)   . Chronic systolic CHF (congestive heart failure) (HCC)   . Uncontrolled  type 2 diabetes mellitus with complication (HCC)   . Demand myocardial infarction (HCC) 11/08/2015  . Surgery, elective   . Central line infection, initial encounter   . Septic shock (HCC) 11/03/2015  . Encephalopathy acute 11/03/2015  . Acute blood loss as cause of postoperative anemia 11/03/2015  . Acute respiratory failure with hypoxia (HCC)   . CVA (cerebral vascular accident) (HCC)   . S/P femoral-popliteal bypass surgery   . Malnutrition of moderate degree 11/01/2015  . Diabetic ulcer of left midfoot associated with diabetes mellitus due to underlying condition, with muscle involvement without evidence of necrosis (HCC)   . Hypertensive heart disease with chronic diastolic congestive heart failure (HCC) 10/31/2015  . Osteomyelitis of left foot (HCC) 10/30/2015  . PVD (peripheral vascular disease) with claudication (HCC) 09/28/2015  . Critical lower limb ischemia 08/28/2015  . Hyperlipidemia with target LDL less than 70 06/28/2013  . Diabetes mellitus type II, uncontrolled (HCC) 03/03/2013  . Complicated migraine 03/03/2013  . Stroke-like symptom 03/02/2013  . Diabetes mellitus (HCC) 03/02/2013  . Aphasia 03/02/2013  . Headache(784.0) 03/02/2013  . PAD (peripheral artery disease) (HCC) 06/05/2011  . Coronary artery disease involving native coronary artery of native heart without angina pectoris 06/05/2011  . Hypotension 06/05/2011  . Claudication in peripheral vascular disease:  Lifestyle limiting. 06/05/2011  . Hx of tobacco use, presenting hazards to health 06/05/2011   PCP:  Georgann Housekeeper, MD Pharmacy:   [id number] - [place], [place] - [address] AT [facility] [address] [place] [address] Phone: [phone] Fax: [phone]  [facility] [place], [place] - [address] [place] [address] Phone: [phone] Fax: [phone]     Social Determinants of  Health (SDOH) Interventions    Readmission Risk Interventions No flowsheet data found.

## 2019-04-26 NOTE — Progress Notes (Signed)
Physical Therapy Treatment Patient Details Name: [name] [name] MRN: [id number] DOB: [birth year] Today's Date: 04/26/2019    History of Present Illness Pt is 69 y.o. female with PMHx including DM2, HTN, CKD, CAD, PVD, combined systolic and diastolic heart failure, CVA, COPD who presented with 2 day history of blister and increased bil LE edema (worse on LLE). Pt admitted with possible L leg cellulitis.     PT Comments    Pt received resting in bed and agreeable to PT. Pt required supervision-min assist for bed mobility and min guard for transfers and ambulation. Pt  Is progressing slowly limited by increased pain in LLE. Pt ambulated with antalgic gait pattern, with sig increased trunk flexion, step to pattern and heavy reliance on RW. Pt limited in ambulation tolerance secondary to pain and is not yet able to ambulate household distances. Pt reporting feeling extremely weak in her UEs after ambulation. Pt educated on UE and LE therex to perform on her own as HEP for improved strengthening. Pt worked on using bed rails to pull to an upright sitting position in the bed x3-4 reps before too fatigued to do further reps. Pt encouraged to keep working on strengthening. Currently patient is unable to ambulate household distances with RW and will require a manual w/c for safe household ambulation. Pt will continue to benefit from acute PT for further mobility progression.    Follow Up Recommendations  Supervision for mobility/OOB     Equipment Recommendations  Wheelchair (measurements PT);Wheelchair cushion (measurements PT);Other (comment)(pt will need a w/c if not progressing to ambulating household distances)    Recommendations for Other Services       Precautions / Restrictions Precautions Precautions: Fall Precaution Comments: LE edema, blister Restrictions Weight Bearing Restrictions: No    Mobility  Bed Mobility Overal bed mobility: Needs Assistance Bed Mobility: Supine to Sit;Sit to  Supine     Supine to sit: Supervision Sit to supine: Min assist   General bed mobility comments: increased time and effort to get EOB but no physical assist required, min A for LE advancement returning to supine, increased time and effort, use of bed rails for scooting in bed  Transfers Overall transfer level: Needs assistance   Transfers: Sit to/from Stand Sit to Stand: Min guard         General transfer comment: min guard for safety, overall steady however pain limited, heavy reliance on RW and increased trunk flexion  Ambulation/Gait Ambulation/Gait assistance: Min guard Gait Distance (Feet): 12 Feet Assistive device: Rolling walker (2 wheeled) Gait Pattern/deviations: Step-to pattern;Antalgic;Trunk flexed Gait velocity: reduced   General Gait Details: antalgic gait pattern with sig trunk flexion and heavy reliance on RW for BUE support, very limited tolerance due to pain in LLE   Stairs             Wheelchair Mobility    Modified Rankin (Stroke Patients Only)       Balance Overall balance assessment: Needs assistance Sitting-balance support: No upper extremity supported;Feet supported Sitting balance-Leahy Scale: Good     Standing balance support: Bilateral upper extremity supported Standing balance-Leahy Scale: Fair Standing balance comment: minG with BUE support of RW                            Cognition Arousal/Alertness: Awake/alert Behavior During Therapy: WFL for tasks assessed/performed Overall Cognitive Status: Within Functional Limits for tasks assessed  Exercises Total Joint Exercises Ankle Circles/Pumps: AROM;Both;10 reps Hip ABduction/ADduction: AROM;Both;5 reps Straight Leg Raises: AROM;Both;5 reps Long Arc Quad: AROM;Both;10 reps Marching in Standing: AROM;Both;10 reps;Seated Other Exercises Other Exercises: pull to upright long sitting using bed rails x3, pt  fatigues quickly and sig weakness Other Exercises: educated on performing HEP throughout the day for improved strengthening for LEs and UEs (elbow flexion, ext, chest press, pull to sit)    General Comments General comments (skin integrity, edema, etc.): VSS      Pertinent Vitals/Pain Faces Pain Scale: Hurts even more Pain Location: LLE with WB Pain Descriptors / Indicators: Aching;Sore Pain Intervention(s): Limited activity within patient's tolerance;Monitored during session    Home Living                      Prior Function            PT Goals (current goals can now be found in the care plan section) Progress towards PT goals: Progressing toward goals    Frequency    Min 3X/week      PT Plan Current plan remains appropriate    Co-evaluation              AM-PAC PT "6 Clicks" Mobility   Outcome Measure  Help needed turning from your back to your side while in a flat bed without using bedrails?: None Help needed moving from lying on your back to sitting on the side of a flat bed without using bedrails?: None Help needed moving to and from a bed to a chair (including a wheelchair)?: A Little Help needed standing up from a chair using your arms (e.g., wheelchair or bedside chair)?: A Little Help needed to walk in hospital room?: A Little Help needed climbing 3-5 steps with a railing? : Total 6 Click Score: 18    End of Session Equipment Utilized During Treatment: Gait belt Activity Tolerance: Patient limited by pain Patient left: in bed;with call bell/phone within reach Nurse Communication: Mobility status PT Visit Diagnosis: Other abnormalities of gait and mobility (R26.89);Unsteadiness on feet (R26.81)     Time: 5053-9767 PT Time Calculation (min) (ACUTE ONLY): 25 min  Charges:  $Therapeutic Exercise: 23-37 mins                     [name] [name] PT, DPT 2:28 PM,04/26/19    [name] [name] 04/26/2019, 2:27 PM

## 2019-04-26 NOTE — Progress Notes (Signed)
Pt was started on IV cefazolin for possible lower extremity cellulitis. Her blood cultures came back with likely contaminant CNS (1/4 bottles). D/w with Dr. Renford Dills and we will change cefazolin to Keflex for 5 more days to complete 7d.  CrCl~30   Dc cefazolin Keflex 500mg  PO TID x5d  , PharmD, BCIDP, AAHIVP, CPP Infectious Disease Pharmacist 04/26/2019 1:51 PM

## 2019-04-26 NOTE — Progress Notes (Signed)
PROGRESS NOTE    [name] [name]  VZD:[id number] DOB: 1950/10/30 DOA: 04/24/2019 PCP: Wenda Low, MD   Brief Narrative:  Patient is a 69 year old female with history of diabetes type 2, hypertension, CKD stage IIIa, coronary artery disease, peripheral vascular disease, combined systolic/diastolic CHF, CVA, hyperlipidemia, COPD who presented with 2 days history of increased bilateral lower extremity edema, worse on the left.  She has also experienced worsening shortness of breath on exertion, weight gain.  On presentation, she was found to have AKI, elevated BNP, severe bilateral lower extremity edema.  Admitted for the management of acute on chronic congestive heart failure.  Also suspected to have cellulitis of lower extremities and has been started on antibiotics.  Assessment & Plan:   Active Problems:   PAD (peripheral artery disease) (HCC)   Coronary artery disease involving native coronary artery of native heart without angina pectoris   Diabetes mellitus ([place])   Diabetes mellitus type II, uncontrolled ([place])   Hyperlipidemia with target LDL less than 70   PVD (peripheral vascular disease) with claudication (HCC)   CVA (cerebral vascular accident) ([place])   Acute on chronic systolic CHF (congestive heart failure) (HCC)   GERD (gastroesophageal reflux disease)   Benign hypertension   CKD (chronic kidney disease) stage 3, GFR 30-59 ml/min   CHF exacerbation (HCC)   Chronic a-fib (HCC)   Cellulitis   Acute on chronic combined congestive heart failure: Presented with worsening bilateral lower extremity edema,DOE.  Echocardiogram as per 2/21 showed ejection fraction of 25 to 30%, global hypokinesis, grade 3 diastolic dysfunction.  Takes Lasix 80 mg daily at home. Started on IV Lasix.  Continue current Lasix dose.  Continue to monitor input/output, daily weight.  She follows with cardiology Elevated BNP on presentation.  Bilateral lower extremity edema/suspected cellulitis: Started on  Ancef.  Had a big bulla on left   lower extremities.  Venous Doppler to rule out DVT was negative .  Positive blood culture: Aerobic bottle showing gram-positive cocci in clusters.  Most likely contamination.  We will follow-up the final blood culture report.  We will send repeat blood cultures.  Patient does not have any signs of sepsis.  History of coronary artery disease: Denies any chest pain.  Continue current home medications.  Minimal elevation of troponin with flat trend.  She follows with Dr. Claiborne Billings  History of peripheral artery disease: On Plavix  Hyperlipidemia: Continue statin  Diabetes type 2: Continue current insulin regimen.  History of CVA: On Plavix and statin at home which we will continue.  CKD stage IIIa: Creatinine up from baseline on presentation. Improving with diuresis. Baseline creatinine around 1.5. we will follow-up renal function closely with diuresis. She follows with Dr. Royce Macadamia  Hypertension: Currently blood pressure stable.  Continue current medications  Paroxysmal A. fib: Currently rate is controlled.  On Toprol.  On Eliquis for anticoagulation.  Hypomagnesemia: Supplemented  Debility/deconditioning: Patient seen by PT/OT and recommended skilled facility.  She currently denies any home health or skilled nursing facility placement           DVT prophylaxis:Eliquis Code Status: Full Family Communication: Called daughter on phone for update on 04/25/19  Disposition Plan: Patient is from home.  Expect discharge disposition to home after adequate diuresis, improvement in the cellulitis, lower extremity edema .Likely tomorrow  Consultants: None Procedures:None  Antimicrobials:  Anti-infectives (From admission, onward)   Start     Dose/Rate Route Frequency Ordered Stop   04/26/19 1000  cephALEXin (KEFLEX) capsule 500 mg  500 mg Oral Every 12 hours 04/26/19 0814 05/01/19 0959   04/24/19 2300  ceFAZolin (ANCEF) IVPB 2g/100 mL premix  Status:   Discontinued     2 g 200 mL/hr over 30 Minutes Intravenous Every 12 hours 04/24/19 2225 04/26/19 0814   04/24/19 1930  clindamycin (CLEOCIN) IVPB 600 mg     600 mg 100 mL/hr over 30 Minutes Intravenous  Once 04/24/19 1919 04/24/19 2030      Subjective: Patient seen and examined at the bedside this morning.  Hemodynamically stable.  Remains comfortable.  Still has bilateral lower extremity significant edema, erythema.  Needing more iV diuresis Objective: Vitals:   04/25/19 1948 04/25/19 2328 04/26/19 0414 04/26/19 0813  BP: (!) 142/65 133/64 (!) 145/65 (!) 129/56  Pulse: 67 69 68 68  Resp: 18 17 16 17   Temp: 98.1 F (36.7 C) 97.8 F (36.6 C) 97.9 F (36.6 C) 97.6 F (36.4 C)  TempSrc: Oral Oral Oral Oral  SpO2: 98% 99% 98% 97%  Weight:        Intake/Output Summary (Last 24 hours) at 04/26/2019 0814 Last data filed at 04/26/2019 0400 Gross per 24 hour  Intake 640 ml  Output 1450 ml  Net -810 ml   Filed Weights   04/24/19 1854 04/25/19 0344  Weight: 81.6 kg 76.8 kg    Examination:  General exam: Comfortable Respiratory system:no wheezes or crackles  Cardiovascular system: Sinus rhythm.  No JVD, murmurs, rubs, gallops or clicks. Gastrointestinal system: Abdomen is nondistended, soft and nontender. No organomegaly or masses felt. Normal bowel sounds heard. Central nervous system: Alert and oriented. No focal neurological deficits. 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, erythematous skin Skin: Erythematous changes of bilateral lower extremities, large bulla on the medial side of the left lower extremity, no ulcers    Data Reviewed: I have personally reviewed following labs and imaging studies  CBC: Recent Labs  Lab 04/24/19 1745 04/25/19 0233  WBC 8.9 8.5  HGB 9.6* 9.5*  HCT 31.4* 30.8*  MCV 78.3* 78.2*  PLT 364 333   Basic Metabolic Panel: Recent Labs  Lab 04/24/19 1745 04/24/19 2100 04/25/19 0233  04/26/19 0502  NA 139  --  140 141  K 5.2*  --  4.8 4.1  CL 106  --  107 105  CO2 21*  --  18* 23  GLUCOSE 126*  --  125* 108*  BUN 51*  --  54* 47*  CREATININE 2.20*  --  2.08* 1.79*  CALCIUM 8.9  --  8.9 9.0  MG  --  1.5* 1.5* 2.2  PHOS  --   --  4.4  --    GFR: Estimated Creatinine Clearance: 29.7 mL/min (A) (by C-G formula based on SCr of 1.79 mg/dL (H)). Liver Function Tests: No results for input(s): AST, ALT, ALKPHOS, BILITOT, PROT, ALBUMIN in the last 168 hours. No results for input(s): LIPASE, AMYLASE in the last 168 hours. No results for input(s): AMMONIA in the last 168 hours. Coagulation Profile: No results for input(s): INR, PROTIME in the last 168 hours. Cardiac Enzymes: No results for input(s): CKTOTAL, CKMB, CKMBINDEX, TROPONINI in the last 168 hours. BNP (last 3 results) No results for input(s): PROBNP in the last 8760 hours. HbA1C: Recent Labs    04/25/19 0233  HGBA1C 7.1*   CBG: Recent Labs  Lab 04/25/19 1614 04/25/19 1944 04/25/19 2331 04/26/19 0417 04/26/19 0808  GLUCAP 153* 149* 150* 109* 137*   Lipid Profile:  No results for input(s): CHOL, HDL, LDLCALC, TRIG, CHOLHDL, LDLDIRECT in the last 72 hours. Thyroid Function Tests: Recent Labs    04/25/19 0233  TSH 1.082   Anemia Panel: No results for input(s): VITAMINB12, FOLATE, FERRITIN, TIBC, IRON, RETICCTPCT in the last 72 hours. Sepsis Labs: Recent Labs  Lab 04/24/19 1848  LATICACIDVEN 2.1*    Recent Results (from the past 240 hour(s))  Blood culture (routine x 2)     Status: None (Preliminary result)   Collection Time: 04/24/19  6:45 PM   Specimen: BLOOD  Result Value Ref Range Status   Specimen Description BLOOD BLOOD RIGHT FOREARM  Final   Special Requests   Final    BOTTLES DRAWN AEROBIC AND ANAEROBIC Blood Culture adequate volume   Culture  Setup Time   Final    GRAM POSITIVE COCCI IN CLUSTERS AEROBIC BOTTLE ONLY CRITICAL RESULT CALLED TO, READ BACK BY AND VERIFIED WITH: J.  CARNEY, PHARMD AT 2022 ON 04/25/19 BY C. JESSUP, MT. Performed at [facility], [address], [place], [place] [address]    Culture GRAM POSITIVE COCCI  Final   Report Status PENDING  Incomplete  Blood culture (routine x 2)     Status: None (Preliminary result)   Collection Time: 04/24/19  6:48 PM   Specimen: BLOOD  Result Value Ref Range Status   Specimen Description BLOOD RIGHT ANTECUBITAL  Final   Special Requests   Final    BOTTLES DRAWN AEROBIC AND ANAEROBIC Blood Culture adequate volume   Culture   Final    NO GROWTH < 12 HOURS Performed at [facility], [address], [place], [place] [address]    Report Status PENDING  Incomplete  SARS CORONAVIRUS 2 (TAT 6-24 HRS) Nasopharyngeal Nasopharyngeal Swab     Status: None   Collection Time: 04/24/19  6:48 PM   Specimen: Nasopharyngeal Swab  Result Value Ref Range Status   SARS Coronavirus 2 NEGATIVE NEGATIVE Final    Comment: (NOTE) SARS-CoV-2 target nucleic acids are NOT DETECTED. The SARS-CoV-2 RNA is generally detectable in upper and lower respiratory specimens during the acute phase of infection. Negative results do not preclude SARS-CoV-2 infection, do not rule out co-infections with other pathogens, and should not be used as the sole basis for treatment or other patient management decisions. Negative results must be combined with clinical observations, patient history, and epidemiological information. The expected result is Negative. Fact Sheet for Patients: HairSlick.no Fact Sheet for Healthcare Providers: quierodirigir.com This test is not yet approved or cleared by the [place] FDA and  has been authorized for detection and/or diagnosis of SARS-CoV-2 by FDA under an Emergency Use Authorization (EUA). This EUA will remain  in effect (meaning this test can be used) for the duration of the COVID-19 declaration under Section 56 4(b)(1) of the Act, 21  U.S.C. section 360bbb-3(b)(1), unless the authorization is terminated or revoked sooner. Performed at [facility], [address], [place], [place] [address]          Radiology Studies: DG Tibia/Fibula Left  Result Date: 04/24/2019 CLINICAL DATA:  Swelling and rash EXAM: LEFT TIBIA AND FIBULA - 2 VIEW COMPARISON:  None. FINDINGS: There is diffuse osteopenia. No fracture or dislocation. There is diffuse subcutaneous edema seen surrounding the lower extremity. No subcutaneous emphysema seen within the soft tissues. Overlying skin folds are seen within the medial aspect of the lower extremity. Surgical clips and dense vascular calcifications are noted. IMPRESSION: Diffuse subcutaneous edema,  no definite emphysema within the soft tissues. Electronically Signed   By: Jonna Clark M.D.   On: 04/24/2019 21:02   DG Chest Port 1 View  Result Date: 04/24/2019 CLINICAL DATA:  Dyspnea. Bilateral leg edema. EXAM: PORTABLE CHEST 1 VIEW COMPARISON:  03/23/2019 FINDINGS: The heart size remains enlarged. Aortic calcifications are noted. There is vascular congestion with possible early pulmonary edema. There is a small right-sided pleural effusion. There is no focal infiltrate. There is probable atelectasis at the lung bases. IMPRESSION: Cardiomegaly with pulmonary vascular congestion and possible early pulmonary edema. Small right pleural effusion. Electronically Signed   By: Katherine Mantle M.D.   On: 04/24/2019 18:33   VAS [place] LOWER EXTREMITY VENOUS (DVT) (ONLY [facility] & [facility])  Result Date: 04/25/2019  Lower Venous DVTStudy Indications: Weeping, blister.  Limitations: Patient's inability to tolerate touch/compression and bandages. Comparison Study: No prior study Performing Technologist: Sherren Kerns RVS  Examination Guidelines: A complete evaluation includes B-mode imaging, spectral Doppler, color Doppler, and power Doppler as needed of all accessible portions of each vessel. Bilateral testing is  considered an integral part of a complete examination. Limited examinations for reoccurring indications may be performed as noted. The reflux portion of the exam is performed with the patient in reverse Trendelenburg.  +-----+---------------+---------+-----------+----------+--------------+ RIGHTCompressibilityPhasicitySpontaneityPropertiesThrombus Aging +-----+---------------+---------+-----------+----------+--------------+ CFV  Full           Yes      Yes                  pulsatile      +-----+---------------+---------+-----------+----------+--------------+   +---------+---------------+---------+-----------+----------+-------------------+ LEFT     CompressibilityPhasicitySpontaneityPropertiesThrombus Aging      +---------+---------------+---------+-----------+----------+-------------------+ CFV      Full                                         pulsatile           +---------+---------------+---------+-----------+----------+-------------------+ SFJ      Full                                                             +---------+---------------+---------+-----------+----------+-------------------+ FV Prox                 Yes      Yes                  patent by color and                                                       Doppler             +---------+---------------+---------+-----------+----------+-------------------+ FV Mid                                                patent by color and  Doppler             +---------+---------------+---------+-----------+----------+-------------------+ FV Distal                                             patent by color and                                                       Doppler             +---------+---------------+---------+-----------+----------+-------------------+ POP      Full           Yes      Yes                                       +---------+---------------+---------+-----------+----------+-------------------+ PTV                                                   Not visualized      +---------+---------------+---------+-----------+----------+-------------------+ PERO                                                  Not visualized      +---------+---------------+---------+-----------+----------+-------------------+     Summary: RIGHT: - No evidence of common femoral vein obstruction.  LEFT: - There is no evidence of deep vein thrombosis in the lower extremity. However, portions of this examination were limited- see technologist comments above.  *See table(s) above for measurements and observations. Electronically signed by Waverly Ferrari MD on 04/25/2019 at 1:47:29 PM.    Final         Scheduled Meds: . apixaban  5 mg Oral BID  . atorvastatin  80 mg Oral QHS  . cephALEXin  500 mg Oral Q12H  . clopidogrel  75 mg Oral Daily  . furosemide  80 mg Intravenous BID  . insulin aspart  0-9 Units Subcutaneous Q4H  . insulin detemir  30 Units Subcutaneous QHS  . isosorbide mononitrate  90 mg Oral Daily  . metoprolol succinate  150 mg Oral Daily  . pantoprazole  40 mg Oral Daily  . sodium chloride flush  3 mL Intravenous Once  . sodium chloride flush  3 mL Intravenous Q12H   Continuous Infusions: . sodium chloride       LOS: 2 days    Time spent: 35 mins.More than 50% of that time was spent in counseling and/or coordination of care.      Burnadette Pop, MD [facility] P3/29/2021, 8:14 AM

## 2019-04-26 NOTE — Discharge Instructions (Signed)

## 2019-04-27 LAB — BASIC METABOLIC PANEL
Anion gap: 11 (ref 5–15)
BUN: 44 mg/dL — ABNORMAL HIGH (ref 8–23)
CO2: 22 mmol/L (ref 22–32)
Calcium: 8.7 mg/dL — ABNORMAL LOW (ref 8.9–10.3)
Chloride: 107 mmol/L (ref 98–111)
Creatinine, Ser: 1.72 mg/dL — ABNORMAL HIGH (ref 0.44–1.00)
GFR calc Af Amer: 35 mL/min — ABNORMAL LOW (ref >60)
GFR calc non Af Amer: 30 mL/min — ABNORMAL LOW (ref >60)
Glucose, Bld: 150 mg/dL — ABNORMAL HIGH (ref 70–99)
Potassium: 4.3 mmol/L (ref 3.5–5.1)
Sodium: 140 mmol/L (ref 135–145)

## 2019-04-27 LAB — CULTURE, BLOOD (ROUTINE X 2): Special Requests: ADEQUATE

## 2019-04-27 LAB — GLUCOSE, CAPILLARY
Glucose-Capillary: 104 mg/dL — ABNORMAL HIGH (ref 70–99)
Glucose-Capillary: 170 mg/dL — ABNORMAL HIGH (ref 70–99)
Glucose-Capillary: 153 mg/dL — ABNORMAL HIGH (ref 70–99)

## 2019-04-27 MED ORDER — FUROSEMIDE 20 MG PO TABS: 60.0000 mg | ORAL_TABLET | Freq: Two times a day (BID) | ORAL | 1 refills | Status: DC

## 2019-04-27 MED ORDER — CEPHALEXIN 500 MG PO CAPS: 500.0000 mg | ORAL_CAPSULE | Freq: Three times a day (TID) | ORAL | 0 refills | Status: AC

## 2019-04-27 MED ORDER — POTASSIUM CHLORIDE ER 20 MEQ PO TBCR: 40.0000 meq | EXTENDED_RELEASE_TABLET | Freq: Every day | ORAL | 1 refills | Status: DC

## 2019-04-27 NOTE — Discharge Summary (Signed)
Physician Discharge Summary  JERALDIN FESLER AJG:[id number] DOB: 07/21/1950 DOA: 04/24/2019  PCP: Wenda Low, MD  Admit date: 04/24/2019 Discharge date: 04/27/2019  Admitted From: Home Disposition:  Home  Discharge Condition:Stable CODE STATUS:FULL Diet recommendation: Heart Healthy   Brief/Interim Summary: Patient is a 69 year old female with history of diabetes type 2, hypertension, CKD stage IIIa, coronary artery disease, peripheral vascular disease, combined systolic/diastolic CHF, CVA, hyperlipidemia, COPD who presented with 2 days history of increased bilateral lower extremity edema, worse on the left.  She has also experienced worsening shortness of breath on exertion, weight gain.  On presentation, she was found to have AKI, elevated BNP, severe bilateral lower extremity edema.  Admitted for the management of acute on chronic congestive heart failure.  Also suspected to have cellulitis of lower extremities and has been started on antibiotics.  She was started on IV Lasix.  She had good diuresis during this hospitalization.  Bilateral lower extremity edema significantly improved along with cellulitis.  She is hemodynamically stable for discharge to home today.  She was seen by PT/OT and recommended skilled nursing facility but she did not declined it including home health.  Following problems were addressed during her hospitalization:  Acute on chronic combined congestive heart failure: Presented with worsening bilateral lower extremity edema,DOE.  Echocardiogram as per 2/21 showed ejection fraction of 25 to 30%, global hypokinesis, grade 3 diastolic dysfunction.  Takes Lasix 80 mg daily at home. Started on IV Lasix.  She follows with cardiology Elevated BNP on presentation. Had good diuresis during this hospitalization.  Will change her Lasix to 60 mg twice a day on discharge. I have requested cardiology team to arrange follow-up appointment.  Bilateral lower extremity edema/suspected  cellulitis: Started on Ancef.   Venous Doppler to rule out DVT was negative .  Abx changed to oral.  Positive blood culture: Aerobic bottle showing gram-positive cocci in clusters.  Most likely contamination  History of coronary artery disease: Denies any chest pain.  Continue current home medications.  Minimal elevation of troponin with flat trend.  She follows with Dr. Claiborne Billings  History of peripheral artery disease: On Plavix  Hyperlipidemia: Continue statin  Diabetes type 2: Continue current insulin regimen.  History of CVA: On Plavix and statin at home which we will continue.  CKD stage IIIa: Creatinine up from baseline on presentation. Improved with diuresis. Baseline creatinine around 1.5.She follows with Dr. Royce Macadamia.  Hypertension: Currently blood pressure stable.  Continue current medications  Paroxysmal A. fib: Currently rate is controlled.  On Toprol.  On Eliquis for anticoagulation.  Hypomagnesemia: Supplemented  Debility/deconditioning: Patient seen by PT/OT and recommended skilled facility.  She currently denies any home health or skilled nursing facility placement  Discharge Diagnoses:  Active Problems:   PAD (peripheral artery disease) (HCC)   Coronary artery disease involving native coronary artery of native heart without angina pectoris   Diabetes mellitus (HCC)   Diabetes mellitus type II, uncontrolled ([place])   Hyperlipidemia with target LDL less than 70   PVD (peripheral vascular disease) with claudication (HCC)   CVA (cerebral vascular accident) ([place])   Acute on chronic systolic CHF (congestive heart failure) (HCC)   GERD (gastroesophageal reflux disease)   Benign hypertension   CKD (chronic kidney disease) stage 3, GFR 30-59 ml/min   CHF exacerbation (HCC)   Chronic a-fib (HCC)   Cellulitis    Discharge Instructions  Discharge Instructions    Diet - low sodium heart healthy   Complete by: As directed  Discharge instructions   Complete by:  As directed    1)Take prescribed medications as instructed. 2)Follow up with your PCP in a week.  Do a BMP test during the follow-up. 3)You will be called by your cardiologist's office for the follow-up appointment 4)Please limit fluid intake to less than 1200 mL a day and salt intake to less than 2 g a day.  Monitor your weight at home.   Increase activity slowly   Complete by: As directed      Allergies as of 04/27/2019      Reactions   Doxycycline Other (See Comments)   Lethargy   Hydrochlorothiazide Other (See Comments)   Lethargy   Latex Rash   Penicillins Swelling, Rash   Pt states she has tolerated Keflex in the past without problems. States she may have tolerated Augmentin in the past but it caused GI upset. Has patient had a PCN reaction causing immediate rash, facial/tongue/throat swelling, SOB or lightheadedness with hypotension: Yes Has patient had a PCN reaction causing severe rash involving mucus membranes or skin necrosis: No Has patient had a PCN reaction that required hospitalization No Has patient had a PCN reaction occurring within the last 10 years: No      Medication List    STOP taking these medications   docusate sodium 100 MG capsule Commonly known as: COLACE   gabapentin 300 MG capsule Commonly known as: NEURONTIN   meclizine 12.5 MG tablet Commonly known as: ANTIVERT   ondansetron 4 MG tablet Commonly known as: Zofran     TAKE these medications   acetaminophen 325 MG tablet Commonly known as: TYLENOL Take 650 mg by mouth every 6 (six) hours as needed for mild pain, fever or headache.   apixaban 5 MG Tabs tablet Commonly known as: ELIQUIS Take 1 tablet (5 mg total) by mouth 2 (two) times daily.   atorvastatin 80 MG tablet Commonly known as: LIPITOR Take 80 mg by mouth at bedtime.   cephALEXin 500 MG capsule Commonly known as: KEFLEX Take 1 capsule (500 mg total) by mouth every 8 (eight) hours for 4 days.   clopidogrel 75 MG  tablet Commonly known as: PLAVIX Take 75 mg by mouth daily.   furosemide 20 MG tablet Commonly known as: LASIX Take 3 tablets (60 mg total) by mouth 2 (two) times daily. What changed:   medication strength  how much to take  when to take this   isosorbide mononitrate 30 MG 24 hr tablet Commonly known as: IMDUR Take 3 tablets (90 mg total) by mouth daily.   Levemir FlexTouch 100 UNIT/ML FlexPen Generic drug: insulin detemir Inject 35 Units into the skin at bedtime.   metFORMIN 1000 MG tablet Commonly known as: GLUCOPHAGE Take 1,000 mg by mouth 2 (two) times daily with a meal.   metoprolol succinate 100 MG 24 hr tablet Commonly known as: TOPROL-XL TAKE 1 AND 1/2 TABLETS BY MOUTH DAILY What changed:   how much to take  how to take this  when to take this  additional instructions   nitroGLYCERIN 0.4 MG SL tablet Commonly known as: NITROSTAT Place 0.4 mg under the tongue every 5 (five) minutes as needed for chest pain.   pantoprazole 40 MG tablet Commonly known as: PROTONIX Take 1 tablet (40 mg total) by mouth daily.   Potassium Chloride ER 20 MEQ Tbcr Take 40 mEq by mouth daily.   Ventolin HFA 108 (90 Base) MCG/ACT inhaler Generic drug: albuterol Inhale 1 puff into the lungs every  4 (four) hours as needed for shortness of breath.            Durable Medical Equipment  (From admission, onward)         Start     Ordered   04/26/19 0753  For home use only DME lightweight manual wheelchair with seat cushion  Once    Comments: Patient suffers from CHF, Bilateral lower extremity edema/suspected cellulitis which impairs their ability to perform daily activities like ambulating  in the home.  A cane will not resolve  issue with performing activities of daily living. A wheelchair will allow patient to safely perform daily activities. Patient is not able to propel themselves in the home using a standard weight wheelchair due to CHF, Bilateral lower extremity  edema/suspected cellulitis . Patient can self propel in the lightweight wheelchair. Length of need lifetime  Accessories: elevating leg rests (ELRs), wheel locks, extensions and anti-tippers.  Seat and back cushions   04/26/19 0753         Follow-up Information    Georgann Housekeeper, MD. Schedule an appointment as soon as possible for a visit in 1 week(s).   Specialty: Internal Medicine Contact information: [address] [place] [address] [phone]        Lennette Bihari, MD Follow up in 2 week(s).   Specialty: Cardiology Why: Office will call for appointment Contact information: [address] [place] [address] [phone]          Allergies  Allergen Reactions  . Doxycycline Other (See Comments)    Lethargy   . Hydrochlorothiazide Other (See Comments)    Lethargy   . Latex Rash  . Penicillins Swelling and Rash    Pt states she has tolerated Keflex in the past without problems. States she may have tolerated Augmentin in the past but it caused GI upset. Has patient had a PCN reaction causing immediate rash, facial/tongue/throat swelling, SOB or lightheadedness with hypotension: Yes Has patient had a PCN reaction causing severe rash involving mucus membranes or skin necrosis: No Has patient had a PCN reaction that required hospitalization No Has patient had a PCN reaction occurring within the last 10 years: No    Consultations:  None   Procedures/Studies: DG Tibia/Fibula Left  Result Date: 04/24/2019 CLINICAL DATA:  Swelling and rash EXAM: LEFT TIBIA AND FIBULA - 2 VIEW COMPARISON:  None. FINDINGS: There is diffuse osteopenia. No fracture or dislocation. There is diffuse subcutaneous edema seen surrounding the lower extremity. No subcutaneous emphysema seen within the soft tissues. Overlying skin folds are seen within the medial aspect of the lower extremity. Surgical clips and dense vascular calcifications are noted.  IMPRESSION: Diffuse subcutaneous edema, no definite emphysema within the soft tissues. Electronically Signed   By: Jonna Clark M.D.   On: 04/24/2019 21:02   DG Chest Port 1 View  Result Date: 04/24/2019 CLINICAL DATA:  Dyspnea. Bilateral leg edema. EXAM: PORTABLE CHEST 1 VIEW COMPARISON:  03/23/2019 FINDINGS: The heart size remains enlarged. Aortic calcifications are noted. There is vascular congestion with possible early pulmonary edema. There is a small right-sided pleural effusion. There is no focal infiltrate. There is probable atelectasis at the lung bases. IMPRESSION: Cardiomegaly with pulmonary vascular congestion and possible early pulmonary edema. Small right pleural effusion. Electronically Signed   By: Katherine Mantle M.D.   On: 04/24/2019 18:33   VAS [place] LOWER EXTREMITY VENOUS (DVT) (ONLY [facility] & [facility])  Result Date: 04/25/2019  Lower Venous DVTStudy  Indications: Weeping, blister.  Limitations: Patient's inability to tolerate touch/compression and bandages. Comparison Study: No prior study Performing Technologist: Sherren Kerns RVS  Examination Guidelines: A complete evaluation includes B-mode imaging, spectral Doppler, color Doppler, and power Doppler as needed of all accessible portions of each vessel. Bilateral testing is considered an integral part of a complete examination. Limited examinations for reoccurring indications may be performed as noted. The reflux portion of the exam is performed with the patient in reverse Trendelenburg.  +-----+---------------+---------+-----------+----------+--------------+ RIGHTCompressibilityPhasicitySpontaneityPropertiesThrombus Aging +-----+---------------+---------+-----------+----------+--------------+ CFV  Full           Yes      Yes                  pulsatile      +-----+---------------+---------+-----------+----------+--------------+   +---------+---------------+---------+-----------+----------+-------------------+ LEFT      CompressibilityPhasicitySpontaneityPropertiesThrombus Aging      +---------+---------------+---------+-----------+----------+-------------------+ CFV      Full                                         pulsatile           +---------+---------------+---------+-----------+----------+-------------------+ SFJ      Full                                                             +---------+---------------+---------+-----------+----------+-------------------+ FV Prox                 Yes      Yes                  patent by color and                                                       Doppler             +---------+---------------+---------+-----------+----------+-------------------+ FV Mid                                                patent by color and                                                       Doppler             +---------+---------------+---------+-----------+----------+-------------------+ FV Distal                                             patent by color and  Doppler             +---------+---------------+---------+-----------+----------+-------------------+ POP      Full           Yes      Yes                                      +---------+---------------+---------+-----------+----------+-------------------+ PTV                                                   Not visualized      +---------+---------------+---------+-----------+----------+-------------------+ PERO                                                  Not visualized      +---------+---------------+---------+-----------+----------+-------------------+     Summary: RIGHT: - No evidence of common femoral vein obstruction.  LEFT: - There is no evidence of deep vein thrombosis in the lower extremity. However, portions of this examination were limited- see technologist comments above.  *See table(s) above for  measurements and observations. Electronically signed by Waverly Ferrari MD on 04/25/2019 at 1:47:29 PM.    Final        Subjective:  Patient seen and examined at the bedside this morning.  Hemodynamically stable for discharge today.  Discharge Exam: Vitals:   04/26/19 2346 04/27/19 0451  BP: 130/69 (!) 143/62  Pulse: 64 63  Resp: 16 16  Temp: 97.8 F (36.6 C) 98 F (36.7 C)  SpO2: 97% 99%   Vitals:   04/26/19 1237 04/26/19 1611 04/26/19 2346 04/27/19 0451  BP: (!) 126/53 137/65 130/69 (!) 143/62  Pulse: 69 64 64 63  Resp: Temp: 97.6 F (36.4 C) (!) 97.5 F (36.4 C) 97.8 F (36.6 C) 98 F (36.7 C)  TempSrc: Oral Oral Oral Oral  SpO2: 98% 99% 97% 99%  Weight:        General: Pt is alert, awake, not in acute distress Cardiovascular: RRR, S1/S2 +, no rubs, no gallops Respiratory: CTA bilaterally, no wheezing, no rhonchi Abdominal: Soft, NT, ND, bowel sounds + Extremities: Trace lower extremity edema, no cyanosis    The results of significant diagnostics from this hospitalization (including imaging, microbiology, ancillary and laboratory) are listed below for reference.     Microbiology: Recent Results (from the past 240 hour(s))  Blood culture (routine x 2)     Status: Abnormal   Collection Time: 04/24/19  6:45 PM   Specimen: BLOOD  Result Value Ref Range Status   Specimen Description BLOOD BLOOD RIGHT FOREARM  Final   Special Requests   Final    BOTTLES DRAWN AEROBIC AND ANAEROBIC Blood Culture adequate volume   Culture  Setup Time   Final    GRAM POSITIVE COCCI IN CLUSTERS AEROBIC BOTTLE ONLY CRITICAL RESULT CALLED TO, READ BACK BY AND VERIFIED WITH: J. CARNEY, PHARMD AT 2022 ON 04/25/19 BY C. JESSUP, MT. Performed at [facility], [address] [place], [place] [address]    Culture (A)  Final    STAPHYLOCOCCUS SPECIES (COAGULASE NEGATIVE) THE SIGNIFICANCE OF ISOLATING THIS ORGANISM FROM A SINGLE SET  OF BLOOD CULTURES WHEN MULTIPLE  SETS ARE DRAWN IS UNCERTAIN. PLEASE NOTIFY THE MICROBIOLOGY DEPARTMENT WITHIN ONE WEEK IF SPECIATION AND SENSITIVITIES ARE REQUIRED.    Report Status 04/27/2019 FINAL  Final  Blood culture (routine x 2)     Status: None (Preliminary result)   Collection Time: 04/24/19  6:48 PM   Specimen: BLOOD  Result Value Ref Range Status   Specimen Description BLOOD RIGHT ANTECUBITAL  Final   Special Requests   Final    BOTTLES DRAWN AEROBIC AND ANAEROBIC Blood Culture adequate volume   Culture NO GROWTH 2 DAYS  Final   Report Status PENDING  Incomplete  SARS CORONAVIRUS 2 (TAT 6-24 HRS) Nasopharyngeal Nasopharyngeal Swab     Status: None   Collection Time: 04/24/19  6:48 PM   Specimen: Nasopharyngeal Swab  Result Value Ref Range Status   SARS Coronavirus 2 NEGATIVE NEGATIVE Final    Comment: (NOTE) SARS-CoV-2 target nucleic acids are NOT DETECTED. The SARS-CoV-2 RNA is generally detectable in upper and lower respiratory specimens during the acute phase of infection. Negative results do not preclude SARS-CoV-2 infection, do not rule out co-infections with other pathogens, and should not be used as the sole basis for treatment or other patient management decisions. Negative results must be combined with clinical observations, patient history, and epidemiological information. The expected result is Negative. Fact Sheet for Patients: HairSlick.no Fact Sheet for Healthcare Providers: quierodirigir.com This test is not yet approved or cleared by the [place] FDA and  has been authorized for detection and/or diagnosis of SARS-CoV-2 by FDA under an Emergency Use Authorization (EUA). This EUA will remain  in effect (meaning this test can be used) for the duration of the COVID-19 declaration under Section 56 4(b)(1) of the Act, 21 U.S.C. section 360bbb-3(b)(1), unless the authorization is terminated or revoked sooner. Performed at [facility], [address] [place], [place] [address]      Labs: BNP (last 3 results) Recent Labs    03/20/19 1157 03/23/19 0930 04/25/19 0233  BNP 2,161.2* 2,115.8* 3,075.3*   Basic Metabolic Panel: Recent Labs  Lab 04/24/19 1745 04/24/19 2100 04/25/19 0233 04/26/19 0502 04/27/19 0132  NA 139  --  140 141 140  K 5.2*  --  4.8 4.1 4.3  CL 106  --  107 105 107  CO2 21*  --  18* 23 22  GLUCOSE 126*  --  125* 108* 150*  BUN 51*  --  54* 47* 44*  CREATININE 2.20*  --  2.08* 1.79* 1.72*  CALCIUM 8.9  --  8.9 9.0 8.7*  MG  --  1.5* 1.5* 2.2  --   PHOS  --   --  4.4  --   --    Liver Function Tests: No results for input(s): AST, ALT, ALKPHOS, BILITOT, PROT, ALBUMIN in the last 168 hours. No results for input(s): LIPASE, AMYLASE in the last 168 hours. No results for input(s): AMMONIA in the last 168 hours. CBC: Recent Labs  Lab 04/24/19 1745 04/25/19 0233  WBC 8.9 8.5  HGB 9.6* 9.5*  HCT 31.4* 30.8*  MCV 78.3* 78.2*  PLT 364 333   Cardiac Enzymes: No results for input(s): CKTOTAL, CKMB, CKMBINDEX, TROPONINI in the last 168 hours. BNP: Invalid input(s): POCBNP CBG: Recent Labs  Lab 04/26/19 1609 04/26/19 2024 04/26/19 2346 04/27/19 0447 04/27/19 0848  GLUCAP 205* 139* 170* 104* 170*   D-Dimer Recent Labs    04/24/19 1848  DDIMER 2.34*   Hgb  A1c Recent Labs    04/25/19 0233  HGBA1C 7.1*   Lipid Profile No results for input(s): CHOL, HDL, LDLCALC, TRIG, CHOLHDL, LDLDIRECT in the last 72 hours. Thyroid function studies Recent Labs    04/25/19 0233  TSH 1.082   Anemia work up No results for input(s): VITAMINB12, FOLATE, FERRITIN, TIBC, IRON, RETICCTPCT in the last 72 hours. Urinalysis    Component Value Date/Time   COLORURINE YELLOW 03/21/2019 1543   APPEARANCEUR CLEAR 03/21/2019 1543   LABSPEC 1.013 03/21/2019 1543   PHURINE 5.0 03/21/2019 1543   GLUCOSEU NEGATIVE 03/21/2019 1543   HGBUR NEGATIVE 03/21/2019 1543   BILIRUBINUR NEGATIVE  03/21/2019 1543   KETONESUR NEGATIVE 03/21/2019 1543   PROTEINUR 30 (A) 03/21/2019 1543   UROBILINOGEN 1.0 10/16/2009 0243   NITRITE NEGATIVE 03/21/2019 1543   LEUKOCYTESUR NEGATIVE 03/21/2019 1543   Sepsis Labs Invalid input(s): PROCALCITONIN,  WBC,  LACTICIDVEN Microbiology Recent Results (from the past 240 hour(s))  Blood culture (routine x 2)     Status: Abnormal   Collection Time: 04/24/19  6:45 PM   Specimen: BLOOD  Result Value Ref Range Status   Specimen Description BLOOD BLOOD RIGHT FOREARM  Final   Special Requests   Final    BOTTLES DRAWN AEROBIC AND ANAEROBIC Blood Culture adequate volume   Culture  Setup Time   Final    GRAM POSITIVE COCCI IN CLUSTERS AEROBIC BOTTLE ONLY CRITICAL RESULT CALLED TO, READ BACK BY AND VERIFIED WITH: J. CARNEY, PHARMD AT 2022 ON 04/25/19 BY C. JESSUP, MT. Performed at [facility], [address], [place], [place] [address]    Culture (A)  Final    STAPHYLOCOCCUS SPECIES (COAGULASE NEGATIVE) THE SIGNIFICANCE OF ISOLATING THIS ORGANISM FROM A SINGLE SET OF BLOOD CULTURES WHEN MULTIPLE SETS ARE DRAWN IS UNCERTAIN. PLEASE NOTIFY THE MICROBIOLOGY DEPARTMENT WITHIN ONE WEEK IF SPECIATION AND SENSITIVITIES ARE REQUIRED.    Report Status 04/27/2019 FINAL  Final  Blood culture (routine x 2)     Status: None (Preliminary result)   Collection Time: 04/24/19  6:48 PM   Specimen: BLOOD  Result Value Ref Range Status   Specimen Description BLOOD RIGHT ANTECUBITAL  Final   Special Requests   Final    BOTTLES DRAWN AEROBIC AND ANAEROBIC Blood Culture adequate volume   Culture NO GROWTH 2 DAYS  Final   Report Status PENDING  Incomplete  SARS CORONAVIRUS 2 (TAT 6-24 HRS) Nasopharyngeal Nasopharyngeal Swab     Status: None   Collection Time: 04/24/19  6:48 PM   Specimen: Nasopharyngeal Swab  Result Value Ref Range Status   SARS Coronavirus 2 NEGATIVE NEGATIVE Final    Comment: (NOTE) SARS-CoV-2 target nucleic acids are NOT DETECTED. The  SARS-CoV-2 RNA is generally detectable in upper and lower respiratory specimens during the acute phase of infection. Negative results do not preclude SARS-CoV-2 infection, do not rule out co-infections with other pathogens, and should not be used as the sole basis for treatment or other patient management decisions. Negative results must be combined with clinical observations, patient history, and epidemiological information. The expected result is Negative. Fact Sheet for Patients: HairSlick.no Fact Sheet for Healthcare Providers: quierodirigir.com This test is not yet approved or cleared by the [place] FDA and  has been authorized for detection and/or diagnosis of SARS-CoV-2 by FDA under an Emergency Use Authorization (EUA). This EUA will remain  in effect (meaning this test can be used) for the duration of the COVID-19 declaration under Section 56 4(b)(1) of  the Act, 21 U.S.C. section 360bbb-3(b)(1), unless the authorization is terminated or revoked sooner. Performed at [facility], [address], [place], [place] [address]     Please note: You were cared for by a hospitalist during your hospital stay. Once you are discharged, your primary care physician will handle any further medical issues. Please note that NO REFILLS for any discharge medications will be authorized once you are discharged, as it is imperative that you return to your primary care physician (or establish a relationship with a primary care physician if you do not have one) for your post hospital discharge needs so that they can reassess your need for medications and monitor your lab values.    Time coordinating discharge: 40 minutes  SIGNED:   Burnadette Pop, MD  [facility] 04/27/2019, 10:37 AM Pager [phone]  If 7PM-7AM, please contact night-coverage [email or web address] Password [email or web address]

## 2019-04-27 NOTE — Progress Notes (Signed)
Physical Therapy Treatment Patient Details Name: [name] [name] MRN: [id number] DOB: Feb 08, 1950 Today's Date: 04/27/2019    History of Present Illness Pt is 69 y.o. female with PMHx including DM2, HTN, CKD, CAD, PVD, combined systolic and diastolic heart failure, CVA, COPD who presented with 2 day history of blister and increased bil LE edema (worse on LLE). Pt admitted with possible L leg cellulitis.     PT Comments    Wheelchair delivered to room. Pt educated on w/c parts and management. Pt verbalized understanding of all education provided. She required min assist bed mobility and min guard assist transfers. She performed w/c mobility 50' using BUE with supervision/cues. Pt to d/c home today. She has declined any follow up PT services.     Follow Up Recommendations  Supervision for mobility/OOB     Equipment Recommendations  Wheelchair (measurements PT);Wheelchair cushion (measurements PT)(delivered in room)    Recommendations for Other Services       Precautions / Restrictions Precautions Precautions: Fall;Other (comment) Precaution Comments: LE edema, blister LLE    Mobility  Bed Mobility Overal bed mobility: Needs Assistance Bed Mobility: Supine to Sit;Sit to Supine     Supine to sit: Supervision;HOB elevated Sit to supine: Min assist;HOB elevated   General bed mobility comments: +rail, increased time, assist with BLE back into bed  Transfers Overall transfer level: Needs assistance Equipment used: None Transfers: Squat Pivot Transfers     Squat pivot transfers: Min guard     General transfer comment: squat pivot bed <> w/c  Ambulation/Gait                 Psychologist, counselling mobility: Yes Wheelchair propulsion: Both upper extremities Wheelchair parts: Supervision/cueing Distance: 50 feet, manipulating turns and obstacles Wheelchair Assistance Details (indicate cue type and reason):  Educated on folding w/c, locking bracks, removing extended brake handles, elevating legrests, removing legrests, and swing-away armrests. Pt verbalized understanding. Demonstrated locking brakes during transfers.  Modified Rankin (Stroke Patients Only)       Balance Overall balance assessment: Needs assistance Sitting-balance support: No upper extremity supported;Feet supported Sitting balance-Leahy Scale: Good Sitting balance - Comments: supervision   Standing balance support: Single extremity supported;Bilateral upper extremity supported;During functional activity Standing balance-Leahy Scale: Fair                              Cognition Arousal/Alertness: Awake/alert Behavior During Therapy: WFL for tasks assessed/performed Overall Cognitive Status: Within Functional Limits for tasks assessed                                        Exercises      General Comments        Pertinent Vitals/Pain Pain Assessment: Faces Faces Pain Scale: Hurts even more Pain Location: LLE with WB Pain Descriptors / Indicators: Grimacing;Guarding Pain Intervention(s): Limited activity within patient's tolerance;Monitored during session;Repositioned    Home Living                      Prior Function            PT Goals (current goals can now be found in the care plan section) Acute Rehab PT Goals Patient Stated Goal: home Progress towards PT goals: Progressing toward goals  Frequency    Min 3X/week      PT Plan Current plan remains appropriate    Co-evaluation              AM-PAC PT "6 Clicks" Mobility   Outcome Measure  Help needed turning from your back to your side while in a flat bed without using bedrails?: None Help needed moving from lying on your back to sitting on the side of a flat bed without using bedrails?: None Help needed moving to and from a bed to a chair (including a wheelchair)?: A Little Help needed standing up  from a chair using your arms (e.g., wheelchair or bedside chair)?: A Little Help needed to walk in hospital room?: A Little Help needed climbing 3-5 steps with a railing? : Total 6 Click Score: 18    End of Session   Activity Tolerance: Patient tolerated treatment well Patient left: in bed;with call bell/phone within reach Nurse Communication: Mobility status PT Visit Diagnosis: Other abnormalities of gait and mobility (R26.89);Unsteadiness on feet (R26.81)     Time: 1610-9604 PT Time Calculation (min) (ACUTE ONLY): 24 min  Charges:  $Therapeutic Activity: 8-22 mins $Wheel Chair Management: 8-22 mins                     Lorrin Goodell, PT  Office # [phone] Pager [phone]    Lorriane Shire 04/27/2019, 12:13 PM

## 2019-04-29 LAB — CULTURE, BLOOD (ROUTINE X 2)
Culture: NO GROWTH
Special Requests: ADEQUATE

## 2019-05-01 LAB — CULTURE, BLOOD (ROUTINE X 2)
Culture: NO GROWTH
Special Requests: ADEQUATE

## 2019-05-03 ENCOUNTER — Encounter (HOSPITAL_COMMUNITY): Payer: Self-pay

## 2019-05-03 ENCOUNTER — Inpatient Hospital Stay (HOSPITAL_COMMUNITY)
Admission: EM | Admit: 2019-05-03 | Discharge: 2019-05-07 | DRG: 683 | Disposition: A | Discharge status: Home Health | Payer: Medicare Other | Attending: Family Medicine | Admitting: Family Medicine

## 2019-05-03 ENCOUNTER — Other Ambulatory Visit: Payer: Self-pay

## 2019-05-03 DIAGNOSIS — J449 Chronic obstructive pulmonary disease, unspecified: Secondary | ICD-10-CM | POA: Diagnosis present

## 2019-05-03 DIAGNOSIS — E875 Hyperkalemia: Secondary | ICD-10-CM

## 2019-05-03 DIAGNOSIS — Z87891 Personal history of nicotine dependence: Secondary | ICD-10-CM

## 2019-05-03 DIAGNOSIS — R001 Bradycardia, unspecified: Secondary | ICD-10-CM | POA: Diagnosis present

## 2019-05-03 DIAGNOSIS — E1165 Type 2 diabetes mellitus with hyperglycemia: Secondary | ICD-10-CM | POA: Diagnosis present

## 2019-05-03 DIAGNOSIS — I83009 Varicose veins of unspecified lower extremity with ulcer of unspecified site: Secondary | ICD-10-CM | POA: Diagnosis present

## 2019-05-03 DIAGNOSIS — E1140 Type 2 diabetes mellitus with diabetic neuropathy, unspecified: Secondary | ICD-10-CM | POA: Diagnosis present

## 2019-05-03 DIAGNOSIS — Z823 Family history of stroke: Secondary | ICD-10-CM

## 2019-05-03 DIAGNOSIS — I482 Chronic atrial fibrillation, unspecified: Secondary | ICD-10-CM | POA: Diagnosis not present

## 2019-05-03 DIAGNOSIS — Z7901 Long term (current) use of anticoagulants: Secondary | ICD-10-CM

## 2019-05-03 DIAGNOSIS — R112 Nausea with vomiting, unspecified: Secondary | ICD-10-CM | POA: Diagnosis present

## 2019-05-03 DIAGNOSIS — Z6831 Body mass index (BMI) 31.0-31.9, adult: Secondary | ICD-10-CM

## 2019-05-03 DIAGNOSIS — J189 Pneumonia, unspecified organism: Secondary | ICD-10-CM

## 2019-05-03 DIAGNOSIS — Z888 Allergy status to other drugs, medicaments and biological substances status: Secondary | ICD-10-CM

## 2019-05-03 DIAGNOSIS — Z833 Family history of diabetes mellitus: Secondary | ICD-10-CM

## 2019-05-03 DIAGNOSIS — Z8673 Personal history of transient ischemic attack (TIA), and cerebral infarction without residual deficits: Secondary | ICD-10-CM

## 2019-05-03 DIAGNOSIS — I10 Essential (primary) hypertension: Secondary | ICD-10-CM | POA: Diagnosis present

## 2019-05-03 DIAGNOSIS — E785 Hyperlipidemia, unspecified: Secondary | ICD-10-CM | POA: Diagnosis present

## 2019-05-03 DIAGNOSIS — I872 Venous insufficiency (chronic) (peripheral): Secondary | ICD-10-CM | POA: Diagnosis present

## 2019-05-03 DIAGNOSIS — E1122 Type 2 diabetes mellitus with diabetic chronic kidney disease: Secondary | ICD-10-CM | POA: Diagnosis present

## 2019-05-03 DIAGNOSIS — K219 Gastro-esophageal reflux disease without esophagitis: Secondary | ICD-10-CM | POA: Diagnosis present

## 2019-05-03 DIAGNOSIS — D631 Anemia in chronic kidney disease: Secondary | ICD-10-CM | POA: Diagnosis present

## 2019-05-03 DIAGNOSIS — I130 Hypertensive heart and chronic kidney disease with heart failure and stage 1 through stage 4 chronic kidney disease, or unspecified chronic kidney disease: Secondary | ICD-10-CM | POA: Diagnosis not present

## 2019-05-03 DIAGNOSIS — K8020 Calculus of gallbladder without cholecystitis without obstruction: Secondary | ICD-10-CM | POA: Diagnosis not present

## 2019-05-03 DIAGNOSIS — Z20822 Contact with and (suspected) exposure to covid-19: Secondary | ICD-10-CM | POA: Diagnosis not present

## 2019-05-03 DIAGNOSIS — K529 Noninfective gastroenteritis and colitis, unspecified: Secondary | ICD-10-CM

## 2019-05-03 DIAGNOSIS — Z91040 Latex allergy status: Secondary | ICD-10-CM

## 2019-05-03 DIAGNOSIS — E860 Dehydration: Secondary | ICD-10-CM | POA: Diagnosis present

## 2019-05-03 DIAGNOSIS — I5042 Chronic combined systolic (congestive) and diastolic (congestive) heart failure: Secondary | ICD-10-CM | POA: Diagnosis not present

## 2019-05-03 DIAGNOSIS — E11622 Type 2 diabetes mellitus with other skin ulcer: Secondary | ICD-10-CM | POA: Diagnosis present

## 2019-05-03 DIAGNOSIS — R300 Dysuria: Secondary | ICD-10-CM | POA: Diagnosis present

## 2019-05-03 DIAGNOSIS — Z79899 Other long term (current) drug therapy: Secondary | ICD-10-CM

## 2019-05-03 DIAGNOSIS — I2510 Atherosclerotic heart disease of native coronary artery without angina pectoris: Secondary | ICD-10-CM | POA: Diagnosis present

## 2019-05-03 DIAGNOSIS — N179 Acute kidney failure, unspecified: Secondary | ICD-10-CM | POA: Diagnosis not present

## 2019-05-03 DIAGNOSIS — M1990 Unspecified osteoarthritis, unspecified site: Secondary | ICD-10-CM | POA: Diagnosis present

## 2019-05-03 DIAGNOSIS — Z8249 Family history of ischemic heart disease and other diseases of the circulatory system: Secondary | ICD-10-CM

## 2019-05-03 DIAGNOSIS — L97909 Non-pressure chronic ulcer of unspecified part of unspecified lower leg with unspecified severity: Secondary | ICD-10-CM | POA: Diagnosis not present

## 2019-05-03 DIAGNOSIS — IMO0002 Reserved for concepts with insufficient information to code with codable children: Secondary | ICD-10-CM | POA: Diagnosis present

## 2019-05-03 DIAGNOSIS — Z883 Allergy status to other anti-infective agents status: Secondary | ICD-10-CM

## 2019-05-03 DIAGNOSIS — N189 Chronic kidney disease, unspecified: Secondary | ICD-10-CM | POA: Diagnosis not present

## 2019-05-03 DIAGNOSIS — E1151 Type 2 diabetes mellitus with diabetic peripheral angiopathy without gangrene: Secondary | ICD-10-CM | POA: Diagnosis present

## 2019-05-03 DIAGNOSIS — R197 Diarrhea, unspecified: Secondary | ICD-10-CM | POA: Diagnosis present

## 2019-05-03 DIAGNOSIS — Z7902 Long term (current) use of antithrombotics/antiplatelets: Secondary | ICD-10-CM

## 2019-05-03 DIAGNOSIS — I129 Hypertensive chronic kidney disease with stage 1 through stage 4 chronic kidney disease, or unspecified chronic kidney disease: Secondary | ICD-10-CM | POA: Diagnosis not present

## 2019-05-03 DIAGNOSIS — I252 Old myocardial infarction: Secondary | ICD-10-CM

## 2019-05-03 DIAGNOSIS — Z880 Allergy status to penicillin: Secondary | ICD-10-CM

## 2019-05-03 DIAGNOSIS — N1832 Chronic kidney disease, stage 3b: Secondary | ICD-10-CM | POA: Diagnosis present

## 2019-05-03 DIAGNOSIS — Z794 Long term (current) use of insulin: Secondary | ICD-10-CM

## 2019-05-03 LAB — COMPREHENSIVE METABOLIC PANEL
ALT: 70 U/L — ABNORMAL HIGH (ref 0–44)
AST: 103 U/L — ABNORMAL HIGH (ref 15–41)
Albumin: 3.4 g/dL — ABNORMAL LOW (ref 3.5–5.0)
Alkaline Phosphatase: 207 U/L — ABNORMAL HIGH (ref 38–126)
Anion gap: 11 (ref 5–15)
BUN: 70 mg/dL — ABNORMAL HIGH (ref 8–23)
CO2: 18 mmol/L — ABNORMAL LOW (ref 22–32)
Calcium: 9.1 mg/dL (ref 8.9–10.3)
Chloride: 110 mmol/L (ref 98–111)
Creatinine, Ser: 2.69 mg/dL — ABNORMAL HIGH (ref 0.44–1.00)
GFR calc Af Amer: 20 mL/min — ABNORMAL LOW (ref >60)
GFR calc non Af Amer: 17 mL/min — ABNORMAL LOW (ref >60)
Glucose, Bld: 137 mg/dL — ABNORMAL HIGH (ref 70–99)
Potassium: 7.2 mmol/L (ref 3.5–5.1)
Sodium: 139 mmol/L (ref 135–145)
Total Bilirubin: 1.2 mg/dL (ref 0.3–1.2)
Total Protein: 7.2 g/dL (ref 6.5–8.1)

## 2019-05-03 LAB — CBC
HCT: 33.4 % — ABNORMAL LOW (ref 36.0–46.0)
Hemoglobin: 10 g/dL — ABNORMAL LOW (ref 12.0–15.0)
MCH: 23.6 pg — ABNORMAL LOW (ref 26.0–34.0)
MCHC: 29.9 g/dL — ABNORMAL LOW (ref 30.0–36.0)
MCV: 78.8 fL — ABNORMAL LOW (ref 80.0–100.0)
Platelets: 411 10*3/uL — ABNORMAL HIGH (ref 150–400)
RBC: 4.24 MIL/uL (ref 3.87–5.11)
RDW: 18.3 % — ABNORMAL HIGH (ref 11.5–15.5)
WBC: 10 10*3/uL (ref 4.0–10.5)
nRBC: 0 % (ref 0.0–0.2)

## 2019-05-03 LAB — LIPASE, BLOOD: Lipase: 58 U/L — ABNORMAL HIGH (ref 11–51)

## 2019-05-03 LAB — CULTURE, BLOOD (ROUTINE X 2): Culture: NO GROWTH

## 2019-05-03 MED ORDER — ONDANSETRON HCL 4 MG/2ML IJ SOLN
4.0000 mg | Freq: Once | INTRAMUSCULAR | Status: AC
  Administered 2019-05-04: 4 mg via INTRAVENOUS
  Filled 2019-05-03: qty 2

## 2019-05-03 MED ORDER — SODIUM CHLORIDE 0.9 % IV BOLUS (SEPSIS)
500.0000 mL | Freq: Once | INTRAVENOUS | Status: AC
  Administered 2019-05-04: 500 mL via INTRAVENOUS

## 2019-05-03 MED ORDER — SODIUM CHLORIDE 0.9% FLUSH
3.0000 mL | Freq: Once | INTRAVENOUS | Status: AC
  Administered 2019-05-04: 3 mL via INTRAVENOUS

## 2019-05-03 NOTE — ED Triage Notes (Addendum)
Pt arrives to ED w/ c/o mild abdominal pain, n/v/d x 3 days. Pt denies fever, cough. Pt prescribed ABX for blister on foot, but stopped taking them 3 days ago b/c they caused abdominal pain, n/v/d.

## 2019-05-03 NOTE — ED Provider Notes (Signed)
TIME SEEN: 11:40 PM  CHIEF COMPLAINT: Abdominal pain, vomiting and diarrhea  HPI: Patient is a 69 year old female with history of CHF, hypertension, hyperlipidemia, insulin-dependent diabetes, previous stroke, A. fib on Eliquis who presented to the emergency department with a day of diffuse achy abdominal pain, nausea, vomiting and diarrhea.  Has had 3 episodes of nonbloody diarrhea today.  No fevers, hematuria, vaginal bleeding or discharge.  Has had some dysuria report decreased urine output today.  Reports history of previous hysterectomy, unilateral oophorectomy and appendectomy.  On review of records, patient was just admitted to the hospital 04/24/2019 - 04/27/2019 for CHF exacerbation and lower extremity cellulitis.  Discharged home on increased dose of Lasix as well as potassium.  Lasix was increased from 80 mg daily to 60 mg twice daily and potassium was increased from 20 mEq daily to 40 mEq daily.  She was also discharged on Keflex for cellulitis.  She feels like her leg swelling has improved.  Redness has improved in her legs as well.  She states she does have a large blister to the left inner distal lower extremity that is draining clear fluid but has improved.  She states she did stop the Keflex because of the vomiting and diarrhea and thought this was contributing.  ROS: See HPI Constitutional: no fever  Eyes: no drainage  ENT: no runny nose   Cardiovascular:  no chest pain  Resp: no SOB  GI: + vomiting and diarrhea GU: + dysuria Integumentary: no rash  Allergy: no hives  Musculoskeletal: no leg swelling  Neurological: no slurred speech ROS otherwise negative  PAST MEDICAL HISTORY/PAST SURGICAL HISTORY:  Past Medical History:  Diagnosis Date  . Anemia 10/2015   Acute Blood Loss  . Arthritis    "feel like I have it all over" (08/28/2015)  . Cellulitis and abscess of foot 04/26/2019   left lower extremity  . CHF (congestive heart failure) (HCC)   . Complication of  anesthesia    DIFFICULT WAKING "only when I was smoking; no problems since I quit"  . Coronary artery disease   . Family history of adverse reaction to anesthesia    sister slow to wake up  . GERD (gastroesophageal reflux disease)    takes Protonix daily   . Hip bursitis   . History of blood transfusion    10/2015  . Hyperlipidemia LDL goal < 70 06/28/2013   takes Atorvastatin daily  . Hypertension    takes Metoprolol and Imdur daily  . Hypoxia 01/2018  . Malnutrition (HCC)   . Migraine    "none in a long time" (08/28/2015)  . Myocardial infarction (HCC) 2011  . Neuromuscular disorder (HCC)    DIABETIC NEUROPATHY  . Osteomyelitis (HCC) 2017   Left foot  . PAD (peripheral artery disease) (HCC)   . Peripheral vascular disease (HCC)   . Respiratory failure (HCC) 10/2015   Acute Hypoxia- acute pulmonary edema 11/13/2015  . Septic shock (HCC) 10/2015  . Stroke (HCC)   . Type II diabetes mellitus (HCC)    takes Lantus nightly.Average fasting blood sugar runs 80-90  Type II    MEDICATIONS:  Prior to Admission medications   Medication Sig Start Date End Date Taking? Authorizing Provider  acetaminophen (TYLENOL) 325 MG tablet Take 650 mg by mouth every 6 (six) hours as needed for mild pain, fever or headache.    [provider]  apixaban (ELIQUIS) 5 MG TABS tablet Take 1 tablet (5 mg total) by mouth 2 (two) times  daily. 03/25/19   Guilford Shi, MD  atorvastatin (LIPITOR) 80 MG tablet Take 80 mg by mouth at bedtime.     [provider]  clopidogrel (PLAVIX) 75 MG tablet Take 75 mg by mouth daily.     [provider]  furosemide (LASIX) 20 MG tablet Take 3 tablets (60 mg total) by mouth 2 (two) times daily. 04/27/19   Shelly Coss, MD  isosorbide mononitrate (IMDUR) 30 MG 24 hr tablet Take 3 tablets (90 mg total) by mouth daily. 03/25/19   Guilford Shi, MD  LEVEMIR FLEXTOUCH 100 UNIT/ML Pen Inject 35 Units into the skin at bedtime.  03/15/16    [provider]  metFORMIN (GLUCOPHAGE) 1000 MG tablet Take 1,000 mg by mouth 2 (two) times daily with a meal.    [provider]  metoprolol succinate (TOPROL-XL) 100 MG 24 hr tablet TAKE 1 AND 1/2 TABLETS BY MOUTH DAILY Patient taking differently: Take 150 mg by mouth daily.  03/25/19   Guilford Shi, MD  nitroGLYCERIN (NITROSTAT) 0.4 MG SL tablet Place 0.4 mg under the tongue every 5 (five) minutes as needed for chest pain.  05/15/18   [provider]  pantoprazole (PROTONIX) 40 MG tablet Take 1 tablet (40 mg total) by mouth daily. 12/21/18   Troy Sine, MD  potassium chloride 20 MEQ TBCR Take 40 mEq by mouth daily. 04/27/19   Shelly Coss, MD  VENTOLIN HFA 108 (90 Base) MCG/ACT inhaler Inhale 1 puff into the lungs every 4 (four) hours as needed for shortness of breath. 03/02/18   [provider]    ALLERGIES:  Allergies  Allergen Reactions  . Doxycycline Other (See Comments)    Lethargy   . Hydrochlorothiazide Other (See Comments)    Lethargy   . Latex Rash  . Penicillins Swelling and Rash    Pt states she has tolerated Keflex in the past without problems. States she may have tolerated Augmentin in the past but it caused GI upset. Has patient had a PCN reaction causing immediate rash, facial/tongue/throat swelling, SOB or lightheadedness with hypotension: Yes Has patient had a PCN reaction causing severe rash involving mucus membranes or skin necrosis: No Has patient had a PCN reaction that required hospitalization No Has patient had a PCN reaction occurring within the last 10 years: No    SOCIAL HISTORY:  Social History   Tobacco Use  . Smoking status: Former Smoker    Packs/day: 1.50    Years: 41.00    Pack years: 61.50    Types: Cigarettes    Quit date: 10/12/2009    Years since quitting: 9.5  . Smokeless tobacco: Never Used  Substance Use Topics  . Alcohol use: No    FAMILY HISTORY: Family History  Problem Relation Age of  Onset  . Hypertension Mother   . Heart failure Mother   . Heart failure Father   . Stroke Father   . Diabetes Father     EXAM: BP 135/73 (BP Location: Left Arm)   Pulse 65   Temp 97.7 F (36.5 C) (Oral)   Resp 14   Ht 5\' 4"  (1.626 m)   Wt 78.5 kg   SpO2 95%   BMI 29.70 kg/m  CONSTITUTIONAL: Alert and oriented and responds appropriately to questions. Well-appearing; well-nourished, pleasant, in NAD HEAD: Normocephalic EYES: Conjunctivae clear, pupils appear equal, EOM appear intact ENT: normal nose; moist mucous membranes NECK: Supple, normal ROM CARD: RRR; S1 and S2 appreciated; no murmurs, no clicks, no rubs,  no gallops RESP: Normal chest excursion without splinting or tachypnea; breath sounds clear and equal bilaterally; no wheezes, no rhonchi, no rales, no hypoxia or respiratory distress, speaking full sentences ABD/GI: Normal bowel sounds; non-distended; soft, diffusely tender throughout the abdomen more so in the lower abdomen without guarding or rebound BACK:  The back appears normal EXT: Normal ROM in all joints; no deformity noted, edema to the knees bilaterally, small amount of erythema noted to both legs but no warmth, large blister that has drained to the inner left distal lower extremity SKIN: Normal color for age and race; warm; no rash on exposed skin NEURO: Moves all extremities equally PSYCH: The patient's mood and manner are appropriate.   MEDICAL DECISION MAKING: Patient here with abdominal pain, vomiting and diarrhea.  Seems to be tender mostly throughout the lower abdomen.  Will obtain urinalysis and CT of abdomen pelvis.  She was recently on antibiotics and was recently hospitalized but reports only 3 episodes of diarrhea today.  C. difficile is on the differential although patient denies copious amounts of diarrhea.  Differential includes colitis, viral gastroenteritis, UTI, pyelonephritis, cholecystitis, diverticulitis, pancreatitis.  Doubt bowel obstruction or  perforation.  Labs obtained in triage have been reviewed and interpreted by myself.  She does have mildly elevated liver function tests and lipase.  Her creatinine is elevated from baseline.  Her potassium is critically elevated at 7.2.  This was repeated and is 7.3.  She has no EKG changes.  She is on a cardiac monitor.  Will give IV fluids, albuterol, dextrose and insulin, bicarb.  Will monitor closely.  She denies chest pain, shortness of breath, palpitations.  She is well-appearing.  She will require admission.  Suspect worsening renal function and elevated potassium are due to recent changes in medications from her last discharge on 04/27/2019.  ED PROGRESS: 2:48 AM Pt now going to CT. Able to tolerate oral contrast. Updated with plan of care. Declines any medicine at this time. No vomiting or diarrhea here in the ED.    4:30 AM  Pt resting comfortably. Patient CT of abdomen pelvis independently reviewed and interpreted by myself and radiology. CT shows right lower lobe infiltrate with associated right pleural effusion. She does report associated cough that is nonproductive and shortness of breath recently. CT also shows cholelithiasis without cholecystitis. She has mild thickening of the sigmoid colon which I suspect is colitis. Given concerns for HCAP given recent hospitalization as well as colitis which could be due to C. difficile, discussed with pharmacist who recommended broad-spectrum antibiotics. Will give pain, cefepime and Flagyl. Blood cultures pending. She has not been able to provide [place] with a stool sample in the ED.    Repeat potassium level ordered and pending.    4:55 AM Discussed patient's case with hospitalist, Dr. Toniann Fail.  I have recommended admission and patient (and family if present) agree with this plan. Admitting physician will place admission orders.   I reviewed all nursing notes, vitals, pertinent previous records and interpreted all EKGs, lab and urine results,  imaging (as available).    EKG Interpretation  Date/Time:  Monday May 03 2019 22:33:19 EDT Ventricular Rate:  70 PR Interval:  106 QRS Duration: 108 QT Interval:  448 QTC Calculation: 483 R Axis:   154 Text Interpretation: Sinus rhythm with short PR with occasional Premature ventricular complexes and Fusion complexes Right axis deviation Low voltage QRS Septal infarct , age undetermined Marked ST abnormality, possible lateral subendocardial injury Abnormal ECG No significant  change since last tracing Confirmed by Aaric Dolph, Baxter Hire [phone]) on 05/03/2019 11:05:21 PM        CRITICAL CARE Performed by: Baxter Hire Peace Noyes   Total critical care time: 65 minutes  Critical care time was exclusive of separately billable procedures and treating other patients.  Critical care was necessary to treat or prevent imminent or life-threatening deterioration.  Critical care was time spent personally by me on the following activities: development of treatment plan with patient and/or surrogate as well as nursing, discussions with consultants, evaluation of patient's response to treatment, examination of patient, obtaining history from patient or surrogate, ordering and performing treatments and interventions, ordering and review of laboratory studies, ordering and review of radiographic studies, pulse oximetry and re-evaluation of patient's condition.   ADINA PUZZO was evaluated in Emergency Department on 05/03/2019 for the symptoms described in the history of present illness. She was evaluated in the context of the global COVID-19 pandemic, which necessitated consideration that the patient might be at risk for infection with the SARS-CoV-2 virus that causes COVID-19. Institutional protocols and algorithms that pertain to the evaluation of patients at risk for COVID-19 are in a state of rapid change based on information released by regulatory bodies including the CDC and federal and state organizations. These  policies and algorithms were followed during the patient's care in the ED.      Edie Darley, Layla Maw, DO 05/04/19 [phone]

## 2019-05-04 ENCOUNTER — Encounter (HOSPITAL_COMMUNITY): Payer: Self-pay | Admitting: Internal Medicine

## 2019-05-04 ENCOUNTER — Emergency Department (HOSPITAL_COMMUNITY): Payer: Medicare Other

## 2019-05-04 DIAGNOSIS — R197 Diarrhea, unspecified: Secondary | ICD-10-CM | POA: Diagnosis present

## 2019-05-04 DIAGNOSIS — Z20822 Contact with and (suspected) exposure to covid-19: Secondary | ICD-10-CM | POA: Diagnosis present

## 2019-05-04 DIAGNOSIS — I83009 Varicose veins of unspecified lower extremity with ulcer of unspecified site: Secondary | ICD-10-CM | POA: Diagnosis present

## 2019-05-04 DIAGNOSIS — R112 Nausea with vomiting, unspecified: Secondary | ICD-10-CM | POA: Diagnosis present

## 2019-05-04 DIAGNOSIS — J449 Chronic obstructive pulmonary disease, unspecified: Secondary | ICD-10-CM | POA: Diagnosis present

## 2019-05-04 DIAGNOSIS — I2510 Atherosclerotic heart disease of native coronary artery without angina pectoris: Secondary | ICD-10-CM | POA: Diagnosis present

## 2019-05-04 DIAGNOSIS — N189 Chronic kidney disease, unspecified: Secondary | ICD-10-CM | POA: Diagnosis not present

## 2019-05-04 DIAGNOSIS — E1122 Type 2 diabetes mellitus with diabetic chronic kidney disease: Secondary | ICD-10-CM | POA: Diagnosis present

## 2019-05-04 DIAGNOSIS — E1151 Type 2 diabetes mellitus with diabetic peripheral angiopathy without gangrene: Secondary | ICD-10-CM | POA: Diagnosis present

## 2019-05-04 DIAGNOSIS — Z91040 Latex allergy status: Secondary | ICD-10-CM | POA: Diagnosis not present

## 2019-05-04 DIAGNOSIS — E875 Hyperkalemia: Secondary | ICD-10-CM | POA: Diagnosis present

## 2019-05-04 DIAGNOSIS — N179 Acute kidney failure, unspecified: Secondary | ICD-10-CM

## 2019-05-04 DIAGNOSIS — D631 Anemia in chronic kidney disease: Secondary | ICD-10-CM | POA: Diagnosis present

## 2019-05-04 DIAGNOSIS — E785 Hyperlipidemia, unspecified: Secondary | ICD-10-CM | POA: Diagnosis present

## 2019-05-04 DIAGNOSIS — Z888 Allergy status to other drugs, medicaments and biological substances status: Secondary | ICD-10-CM | POA: Diagnosis not present

## 2019-05-04 DIAGNOSIS — N1832 Chronic kidney disease, stage 3b: Secondary | ICD-10-CM | POA: Diagnosis present

## 2019-05-04 DIAGNOSIS — Z880 Allergy status to penicillin: Secondary | ICD-10-CM | POA: Diagnosis not present

## 2019-05-04 DIAGNOSIS — I130 Hypertensive heart and chronic kidney disease with heart failure and stage 1 through stage 4 chronic kidney disease, or unspecified chronic kidney disease: Secondary | ICD-10-CM | POA: Diagnosis present

## 2019-05-04 DIAGNOSIS — I5042 Chronic combined systolic (congestive) and diastolic (congestive) heart failure: Secondary | ICD-10-CM | POA: Diagnosis present

## 2019-05-04 DIAGNOSIS — Z883 Allergy status to other anti-infective agents status: Secondary | ICD-10-CM | POA: Diagnosis not present

## 2019-05-04 DIAGNOSIS — R001 Bradycardia, unspecified: Secondary | ICD-10-CM | POA: Diagnosis present

## 2019-05-04 DIAGNOSIS — I482 Chronic atrial fibrillation, unspecified: Secondary | ICD-10-CM | POA: Diagnosis present

## 2019-05-04 DIAGNOSIS — L97909 Non-pressure chronic ulcer of unspecified part of unspecified lower leg with unspecified severity: Secondary | ICD-10-CM | POA: Diagnosis present

## 2019-05-04 DIAGNOSIS — Z6831 Body mass index (BMI) 31.0-31.9, adult: Secondary | ICD-10-CM | POA: Diagnosis not present

## 2019-05-04 DIAGNOSIS — K8020 Calculus of gallbladder without cholecystitis without obstruction: Secondary | ICD-10-CM | POA: Diagnosis not present

## 2019-05-04 HISTORY — DX: Acute kidney failure, unspecified: N17.9

## 2019-05-04 LAB — BASIC METABOLIC PANEL
Anion gap: 13 (ref 5–15)
BUN: 73 mg/dL — ABNORMAL HIGH (ref 8–23)
CO2: 15 mmol/L — ABNORMAL LOW (ref 22–32)
Calcium: 8.9 mg/dL (ref 8.9–10.3)
Chloride: 106 mmol/L (ref 98–111)
Creatinine, Ser: 2.57 mg/dL — ABNORMAL HIGH (ref 0.44–1.00)
GFR calc Af Amer: 21 mL/min — ABNORMAL LOW (ref >60)
GFR calc non Af Amer: 18 mL/min — ABNORMAL LOW (ref >60)
Glucose, Bld: 117 mg/dL — ABNORMAL HIGH (ref 70–99)
Potassium: 7.3 mmol/L (ref 3.5–5.1)
Sodium: 134 mmol/L — ABNORMAL LOW (ref 135–145)

## 2019-05-04 LAB — URINALYSIS, ROUTINE W REFLEX MICROSCOPIC
Bacteria, UA: NONE SEEN
Bilirubin Urine: NEGATIVE
Glucose, UA: NEGATIVE mg/dL
Ketones, ur: NEGATIVE mg/dL
Nitrite: NEGATIVE
Protein, ur: 30 mg/dL — AB
Specific Gravity, Urine: 1.012 (ref 1.005–1.030)
pH: 5 (ref 5.0–8.0)

## 2019-05-04 LAB — CBG MONITORING, ED
Glucose-Capillary: 129 mg/dL — ABNORMAL HIGH (ref 70–99)
Glucose-Capillary: 117 mg/dL — ABNORMAL HIGH (ref 70–99)

## 2019-05-04 LAB — POTASSIUM: Potassium: 5.7 mmol/L — ABNORMAL HIGH (ref 3.5–5.1)

## 2019-05-04 LAB — GLUCOSE, CAPILLARY
Glucose-Capillary: 200 mg/dL — ABNORMAL HIGH (ref 70–99)
Glucose-Capillary: 138 mg/dL — ABNORMAL HIGH (ref 70–99)

## 2019-05-04 LAB — SARS CORONAVIRUS 2 (TAT 6-24 HRS): SARS Coronavirus 2: NEGATIVE

## 2019-05-04 MED ORDER — ZOLPIDEM TARTRATE 5 MG PO TABS: 5.0000 mg | ORAL_TABLET | Freq: Every evening | ORAL | Status: DC | PRN

## 2019-05-04 MED ORDER — ACETAMINOPHEN 325 MG PO TABS: 650.0000 mg | ORAL_TABLET | Freq: Four times a day (QID) | ORAL | Status: DC | PRN

## 2019-05-04 MED ORDER — SODIUM BICARBONATE 8.4 % IV SOLN
50.0000 meq | Freq: Once | INTRAVENOUS | Status: AC
  Administered 2019-05-04: 50 meq via INTRAVENOUS
  Filled 2019-05-04: qty 50

## 2019-05-04 MED ORDER — ALBUTEROL SULFATE (2.5 MG/3ML) 0.083% IN NEBU: 3.0000 mL | INHALATION_SOLUTION | RESPIRATORY_TRACT | Status: DC | PRN

## 2019-05-04 MED ORDER — CALCIUM GLUCONATE-NACL 1-0.675 GM/50ML-% IV SOLN
1.0000 g | Freq: Once | INTRAVENOUS | Status: AC
  Administered 2019-05-04: 1000 mg via INTRAVENOUS
  Filled 2019-05-04: qty 50

## 2019-05-04 MED ORDER — IOHEXOL 9 MG/ML PO SOLN
ORAL | Status: AC
  Filled 2019-05-04: qty 500

## 2019-05-04 MED ORDER — METOPROLOL SUCCINATE ER 50 MG PO TB24
150.0000 mg | ORAL_TABLET | Freq: Every evening | ORAL | Status: DC
  Administered 2019-05-04 – 2019-05-06 (×3): 150 mg via ORAL
  Filled 2019-05-04 (×3): qty 1

## 2019-05-04 MED ORDER — ATORVASTATIN CALCIUM 80 MG PO TABS
80.0000 mg | ORAL_TABLET | Freq: Every day | ORAL | Status: DC
  Administered 2019-05-04 – 2019-05-06 (×3): 80 mg via ORAL
  Filled 2019-05-04 (×3): qty 1

## 2019-05-04 MED ORDER — ONDANSETRON HCL 4 MG PO TABS
4.0000 mg | ORAL_TABLET | Freq: Four times a day (QID) | ORAL | Status: DC | PRN
  Administered 2019-05-05: 4 mg via ORAL
  Filled 2019-05-04: qty 1

## 2019-05-04 MED ORDER — PANTOPRAZOLE SODIUM 40 MG PO TBEC
40.0000 mg | DELAYED_RELEASE_TABLET | Freq: Every day | ORAL | Status: DC
  Administered 2019-05-04 – 2019-05-07 (×4): 40 mg via ORAL
  Filled 2019-05-04 (×4): qty 1

## 2019-05-04 MED ORDER — ONDANSETRON HCL 4 MG/2ML IJ SOLN: 4.0000 mg | Freq: Four times a day (QID) | INTRAMUSCULAR | Status: DC | PRN

## 2019-05-04 MED ORDER — CLOPIDOGREL BISULFATE 75 MG PO TABS
75.0000 mg | ORAL_TABLET | Freq: Every day | ORAL | Status: DC
  Administered 2019-05-04 – 2019-05-07 (×4): 75 mg via ORAL
  Filled 2019-05-04 (×4): qty 1

## 2019-05-04 MED ORDER — ISOSORBIDE MONONITRATE ER 60 MG PO TB24
90.0000 mg | ORAL_TABLET | Freq: Every day | ORAL | Status: DC
  Administered 2019-05-04 – 2019-05-07 (×4): 90 mg via ORAL
  Filled 2019-05-04 (×2): qty 1
  Filled 2019-05-04: qty 3
  Filled 2019-05-04: qty 1

## 2019-05-04 MED ORDER — APIXABAN 5 MG PO TABS
5.0000 mg | ORAL_TABLET | Freq: Two times a day (BID) | ORAL | Status: DC
  Administered 2019-05-04 – 2019-05-07 (×7): 5 mg via ORAL
  Filled 2019-05-04 (×8): qty 1

## 2019-05-04 MED ORDER — VANCOMYCIN HCL IN DEXTROSE 1-5 GM/200ML-% IV SOLN
1000.0000 mg | Freq: Once | INTRAVENOUS | Status: AC
  Administered 2019-05-04: 1000 mg via INTRAVENOUS
  Filled 2019-05-04: qty 200

## 2019-05-04 MED ORDER — SODIUM CHLORIDE 0.9 % IV SOLN: INTRAVENOUS | Status: DC

## 2019-05-04 MED ORDER — HYDROCODONE-ACETAMINOPHEN 5-325 MG PO TABS
1.0000 | ORAL_TABLET | ORAL | Status: DC | PRN
  Administered 2019-05-04 – 2019-05-07 (×8): 1 via ORAL
  Filled 2019-05-04: qty 1
  Filled 2019-05-04: qty 2
  Filled 2019-05-04 (×7): qty 1

## 2019-05-04 MED ORDER — ACETAMINOPHEN 650 MG RE SUPP: 650.0000 mg | Freq: Four times a day (QID) | RECTAL | Status: DC | PRN

## 2019-05-04 MED ORDER — SODIUM CHLORIDE 0.9% FLUSH
3.0000 mL | Freq: Two times a day (BID) | INTRAVENOUS | Status: DC
  Administered 2019-05-05 – 2019-05-07 (×3): 3 mL via INTRAVENOUS

## 2019-05-04 MED ORDER — METRONIDAZOLE IN NACL 5-0.79 MG/ML-% IV SOLN
500.0000 mg | Freq: Once | INTRAVENOUS | Status: AC
  Administered 2019-05-04: 500 mg via INTRAVENOUS
  Filled 2019-05-04: qty 100

## 2019-05-04 MED ORDER — INSULIN ASPART 100 UNIT/ML ~~LOC~~ SOLN
0.0000 [IU] | Freq: Three times a day (TID) | SUBCUTANEOUS | Status: DC
  Administered 2019-05-04 – 2019-05-05 (×2): 3 [IU] via SUBCUTANEOUS
  Administered 2019-05-05: 1 [IU] via SUBCUTANEOUS
  Administered 2019-05-06: 2 [IU] via SUBCUTANEOUS

## 2019-05-04 MED ORDER — INSULIN DETEMIR 100 UNIT/ML ~~LOC~~ SOLN
35.0000 [IU] | Freq: Every day | SUBCUTANEOUS | Status: DC
  Administered 2019-05-04: 30 [IU] via SUBCUTANEOUS
  Administered 2019-05-05 – 2019-05-06 (×2): 35 [IU] via SUBCUTANEOUS
  Filled 2019-05-04 (×5): qty 0.35

## 2019-05-04 MED ORDER — MORPHINE SULFATE (PF) 2 MG/ML IV SOLN: 2.0000 mg | INTRAVENOUS | Status: DC | PRN

## 2019-05-04 MED ORDER — DEXTROSE 50 % IV SOLN
1.0000 | Freq: Once | INTRAVENOUS | Status: AC
  Administered 2019-05-04: 50 mL via INTRAVENOUS
  Filled 2019-05-04: qty 50

## 2019-05-04 MED ORDER — INSULIN ASPART 100 UNIT/ML IV SOLN
5.0000 [IU] | Freq: Once | INTRAVENOUS | Status: AC
  Administered 2019-05-04: 5 [IU] via INTRAVENOUS

## 2019-05-04 MED ORDER — ALBUTEROL SULFATE (2.5 MG/3ML) 0.083% IN NEBU
10.0000 mg | INHALATION_SOLUTION | Freq: Once | RESPIRATORY_TRACT | Status: AC
  Administered 2019-05-04: 10 mg via RESPIRATORY_TRACT
  Filled 2019-05-04: qty 12

## 2019-05-04 MED ORDER — HYDRALAZINE HCL 20 MG/ML IJ SOLN: 5.0000 mg | INTRAMUSCULAR | Status: DC | PRN

## 2019-05-04 MED ORDER — INSULIN ASPART 100 UNIT/ML ~~LOC~~ SOLN
0.0000 [IU] | Freq: Every day | SUBCUTANEOUS | Status: DC
  Administered 2019-05-06: 2 [IU] via SUBCUTANEOUS

## 2019-05-04 MED ORDER — SODIUM CHLORIDE 0.9 % IV SOLN
2.0000 g | Freq: Once | INTRAVENOUS | Status: AC
  Administered 2019-05-04: 2 g via INTRAVENOUS
  Filled 2019-05-04: qty 2

## 2019-05-04 NOTE — H&P (Signed)
History and Physical    [name] [name] PYP:[id number] DOB: 1950-07-14 DOA: 05/03/2019  PCP: Georgann Housekeeper, MD Consultants:  Edilia Bo- vascular; Tresa Endo - cardiology Patient coming from:  Home - lives with Daughter; NOK: Daughter, Leotis Shames, [phone]  Chief Complaint: Abdominal pain  HPI: [name] [name] is a 69 y.o. female with medical history significant of DM; HTN; stage 3a CKD; CAD; PVD; chronic combined CHF; CVA; HLD; afib on Eliquis; and COPD presenting with abdominal pain with n/v/d.  She was last admitted from 3/27-30 for acute on chronic combined CHF with LE cellulitis.  She was recommended to d/c to SNF but declined SNF or [facility] services.  She reports n/v/d for 3-4 days.  She was on "all that medication they put me on last time."  She couldn't keep anything down.  She increased her potassium and "then it shot up to 7 something."  She was very sick and lethargic.  No sick contacts.  She was vomiting 3-4 times a day and had diarrhea 5 times yesterday.  No n/v/d since last night.  Some SOB.  She has a "huge blister on this left leg."  No fevers.  She continues to refuse [facility] and SNF.    ED Course:  Carryover, per Dr. Toniann Fail:  69 year old female with CHF and chronic kidney disease recently admitted for CHF exacerbation is presenting to the ER because of worsening nausea vomiting and diarrhea last 3 days. Was recently placed on Keflex for cellulitis of the lower extremity. Patient Lasix also was recently increased. Patient's creatinine is worsened with marked hyperkalemia for which patient is receiving treatment for the hyperkalemia. Will need to come in for worsening renal function hyperkalemia and nausea vomiting diarrhea.  Review of Systems: As per HPI; otherwise review of systems reviewed and negative.   Ambulatory Status:  Ambulates with a walker "and lately I've been having trouble with that."    COVID Vaccine Status:  None  Past Medical History:  Diagnosis Date  . AKI (acute kidney  injury) (HCC) 05/04/2019  . Anemia 10/2015   Acute Blood Loss  . Arthritis    "feel like I have it all over" (08/28/2015)  . Cellulitis and abscess of foot 04/26/2019   left lower extremity  . CHF (congestive heart failure) (HCC)   . Complication of anesthesia    DIFFICULT WAKING "only when I was smoking; no problems since I quit"  . Coronary artery disease   . Family history of adverse reaction to anesthesia    sister slow to wake up  . GERD (gastroesophageal reflux disease)    takes Protonix daily   . Hip bursitis   . History of blood transfusion    10/2015  . Hyperlipidemia LDL goal < 70 06/28/2013   takes Atorvastatin daily  . Hypertension    takes Metoprolol and Imdur daily  . Hypoxia 01/2018  . Malnutrition (HCC)   . Migraine    "none in a long time" (08/28/2015)  . Myocardial infarction (HCC) 2011  . Neuromuscular disorder (HCC)    DIABETIC NEUROPATHY  . Osteomyelitis (HCC) 2017   Left foot  . PAD (peripheral artery disease) (HCC)   . Peripheral vascular disease (HCC)   . Respiratory failure (HCC) 10/2015   Acute Hypoxia- acute pulmonary edema 11/13/2015  . Septic shock (HCC) 10/2015  . Stroke (HCC)   . Type II diabetes mellitus (HCC)    takes Lantus nightly.Average fasting blood sugar runs 80-90  Type II    Past Surgical History:  Procedure Laterality Date  . ABDOMINAL HYSTERECTOMY    . AMPUTATION Right 10/22/2016   Procedure: AMPUTATION DIGIT RIGHT TOES 1-3 POSSIBLE TRANSMETATARSAL;  Surgeon: Chuck Hint, MD;  Location: [facility] OR;  Service: Vascular;  Laterality: Right;  . APPENDECTOMY    . ATHERECTOMY N/A 06/04/2011   Procedure: ATHERECTOMY;  Surgeon: Runell Gess, MD;  Location: [facility] CATH LAB;  Service: Cardiovascular;  Laterality: N/A;  . CARDIAC CATHETERIZATION  10/13/2009   95% stenosis in the AV groove circumflex and 95% ostial stenosis in small OM3. A 3x38mm drug-eluting Promus stent inserted ito the circumflex. Dilatated with a 3.25x28mm  noncompliant Quantum balloon within entire segment. The entire region was reduced to 0% and brisk TIMI3 flow.  . CAROTID DUPLEX  03/19/2011   Right ICA-demonstrates complete occlusion. Left ICA-demonstrates a small amount of fibrous plaque.  Marland Kitchen CATARACT EXTRACTION W/ INTRAOCULAR LENS IMPLANT Right   . CESAREAN SECTION  1990  . CORONARY ANGIOPLASTY    . ENDARTERECTOMY FEMORAL Left 09/05/2015   Procedure: ENDARTERECTOMY FEMORAL WITH PROFUNDOPLASTY;  Surgeon: Chuck Hint, MD;  Location: [facility] OR;  Service: Vascular;  Laterality: Left;  Left common femoral artery vein patch using left saphenous vien  . ENDARTERECTOMY FEMORAL Right 10/22/2016   Procedure: ENDARTERECTOMY RIGHT COMMON FEMORAL;  Surgeon: Chuck Hint, MD;  Location: [facility] OR;  Service: Vascular;  Laterality: Right;  . FEMORAL-POPLITEAL BYPASS GRAFT Left 11/02/2015   Procedure: BYPASS GRAFT FEMORAL-POPLITEAL ARTERY VS FEMORAL-TIBIAL ARTERY BYPASS;  Surgeon: Chuck Hint, MD;  Location: [facility] OR;  Service: Vascular;  Laterality: Left;  . FEMORAL-POPLITEAL BYPASS GRAFT Right 10/22/2016   Procedure: BYPASS GRAFT RIGHT FEMORAL- BELOW KNEE POPLITEAL ARTERY WITH VEIN;  Surgeon: Chuck Hint, MD;  Location: [facility] OR;  Service: Vascular;  Laterality: Right;  . I & D EXTREMITY Left 11/10/2015   Procedure: Debridement Left Foot Ulcer, Application  Wound VAC;  Surgeon: [name], MD;  Location: [facility] OR;  Service: Orthopedics;  Laterality: Left;  . ILIAC ARTERY STENT Left 08/28/2015   common  . INTRAOPERATIVE ARTERIOGRAM Left 09/05/2015   Procedure: INTRA OPERATIVE ARTERIOGRAM;  Surgeon: Chuck Hint, MD;  Location: [facility] OR;  Service: Vascular;  Laterality: Left;  . INTRAOPERATIVE ARTERIOGRAM Left 11/02/2015   Procedure: INTRA OPERATIVE ARTERIOGRAM;  Surgeon: Chuck Hint, MD;  Location: [facility] OR;  Service: Vascular;  Laterality: Left;  . INTRAOPERATIVE ARTERIOGRAM Right 10/22/2016   Procedure: INTRA OPERATIVE  ARTERIOGRAM;  Surgeon: Chuck Hint, MD;  Location: [facility] OR;  Service: Vascular;  Laterality: Right;  . LEFT HEART CATH AND CORONARY ANGIOGRAPHY N/A 05/11/2018   Procedure: LEFT HEART CATH AND CORONARY ANGIOGRAPHY;  Surgeon: Lennette Bihari, MD;  Location: [facility] INVASIVE CV LAB;  Service: Cardiovascular;  Laterality: N/A;  Eugenie Birks MYOVIEW  10/25/2010   Moderate perfusion defect due to infarct/scar with mild perinfarct ischemia seen in the Basal Inferolateral, Basal Anterolateral, Mid Inferolateral, and Mid Anterolateral regions. Post-stress EF is 50%.  . LOWER EXTREMITY ANGIOGRAPHY N/A 10/17/2016   Procedure: Lower Extremity Angiography;  Surgeon: Runell Gess, MD;  Location: [facility] INVASIVE CV LAB;  Service: Cardiovascular;  Laterality: N/A;  . OVARY SURGERY  1983?   "ruptured"  . PERIPHERAL VASCULAR ANGIOGRAM  01/26/2010   High-grade SFA disease: left greater than right. Left SFA would require fem-pop bypass grafting. Right SFA could be stented but might require Diamondback Orbital atherectomy.  Marland Kitchen PERIPHERAL VASCULAR ANGIOGRAM  02/23/2010   Stealth Predator orbital rotational atherectomy performed on SFA & Popliteal  up to 90,000 RPM. Stenting using overlapping 5x155mm and 5x1mm Absolute Pro Nitinol self-expanding stents beginning just at the knee up to the mid SFA resulting in reduction of 90-95% calcified SFA & Popliteal stenosis to 0. Stenting performed on the distal common & proximal iliac artery with a 10x4 Absolute Pro- 70-0%.  Marland Kitchen PERIPHERAL VASCULAR ANGIOGRAM  06/17/2010   PTA performed to the right external iliac artery stent using a 5x100 balloon at 10 atmospheres. Stenting performed using a 6x18 Genesis on Opta balloon. Postdilatation with a 7x2 balloon resulting in a 95% "in-stent" stenosis to 0% residual.  . PERIPHERAL VASCULAR ANGIOGRAM  06/04/2011   Bilateral total SFAs not percutaneously addressable. Good canidate for femoropopliteal bypass grafting  . PERIPHERAL VASCULAR ANGIOGRAM   08/28/2015  . PERIPHERAL VASCULAR CATHETERIZATION N/A 08/28/2015   Procedure: Lower Extremity Angiography;  Surgeon: Runell Gess, MD;  Location: [facility] INVASIVE CV LAB;  Service: Cardiovascular;  Laterality: N/A;  . PERIPHERAL VASCULAR CATHETERIZATION N/A 08/28/2015   Procedure: Abdominal Aortogram;  Surgeon: Runell Gess, MD;  Location: [facility] INVASIVE CV LAB;  Service: Cardiovascular;  Laterality: N/A;  . PERIPHERAL VASCULAR CATHETERIZATION Left 08/28/2015   Procedure: Peripheral Vascular Intervention;  Surgeon: Runell Gess, MD;  Location: [facility] INVASIVE CV LAB;  Service: Cardiovascular;  Laterality: Left;  common iliac  . PERIPHERAL VASCULAR CATHETERIZATION Left 08/28/2015   Procedure: Peripheral Vascular Atherectomy;  Surgeon: Runell Gess, MD;  Location: [facility] INVASIVE CV LAB;  Service: Cardiovascular;  Laterality: Left;  common iliac  . PERIPHERAL VASCULAR CATHETERIZATION N/A 09/28/2015   Procedure: Lower Extremity Angiography;  Surgeon: Runell Gess, MD;  Location: [facility] INVASIVE CV LAB;  Service: Cardiovascular;  Laterality: N/A;  . PERIPHERAL VASCULAR CATHETERIZATION Left 09/28/2015   Procedure: Peripheral Vascular Intervention;  Surgeon: Runell Gess, MD;  Location: [facility] INVASIVE CV LAB;  Service: Cardiovascular;  Laterality: Left CFA  PCI with 9 mm x 4 cm Abbott nitinol absolute Pro self-expanding stent     . SKIN SPLIT GRAFT Left 12/01/2015   Procedure: LEFT FOOT SKIN GRAFT AND VAC;  Surgeon: [name], MD;  Location: [facility] OR;  Service: Orthopedics;  Laterality: Left;  . THROMBECTOMY FEMORAL ARTERY Right 10/22/2016   Procedure: THROMBECTOMY FEMORAL ARTERY;  Surgeon: Chuck Hint, MD;  Location: [facility] OR;  Service: Vascular;  Laterality: Right;  . TRANSTHORACIC ECHOCARDIOGRAM  10/17/2009   EF 45-50%, moderate hypokinesis of the entire inferolateral myocardium, mild concentric hypertrophy and mild regurg of the mitral valva.  Marland Kitchen VEIN HARVEST Left 11/02/2015   Procedure: LEFT  GREATER SAPHENOUS VEIN HARVEST;  Surgeon: Chuck Hint, MD;  Location: [facility] OR;  Service: Vascular;  Laterality: Left;    Social History   Socioeconomic History  . Marital status: Legally Separated    Spouse name: Not on file  . Number of children: Not on file  . Years of education: Not on file  . Highest education level: Not on file  Occupational History  . Occupation: retired Occupational hygienist  Tobacco Use  . Smoking status: Former Smoker    Packs/day: 1.50    Years: 41.00    Pack years: 61.50    Types: Cigarettes    Quit date: 10/12/2009    Years since quitting: 9.5  . Smokeless tobacco: Never Used  Substance and Sexual Activity  . Alcohol use: No  . Drug use: No  . Sexual activity: Not Currently    Birth control/protection: Surgical  Other Topics Concern  . Not on file  Social History Narrative   Admitted to [place]   Former Smoker - stopped 2011   Alcohol  None   Full code   Social Determinants of Health   Financial Resource Strain:   . Difficulty of Paying Living Expenses:   Food Insecurity:   . Worried About Charity fundraiser in the Last Year:   . Arboriculturist in the Last Year:   Transportation Needs:   . Film/video editor (Medical):   Marland Kitchen Lack of Transportation (Non-Medical):   Physical Activity:   . Days of Exercise per Week:   . Minutes of Exercise per Session:   Stress:   . Feeling of Stress :   Social Connections:   . Frequency of Communication with Friends and Family:   . Frequency of Social Gatherings with Friends and Family:   . Attends Religious Services:   . Active Member of Clubs or Organizations:   . Attends Archivist Meetings:   Marland Kitchen Marital Status:   Intimate Partner Violence:   . Fear of Current or Ex-Partner:   . Emotionally Abused:   Marland Kitchen Physically Abused:   . Sexually Abused:     Allergies  Allergen Reactions  . Doxycycline Other (See Comments)    Lethargy   . Hydrochlorothiazide Other  (See Comments)    Lethargy   . Latex Rash  . Penicillins Swelling and Rash    Pt states she has tolerated Keflex in the past without problems. States she may have tolerated Augmentin in the past but it caused GI upset. Has patient had a PCN reaction causing immediate rash, facial/tongue/throat swelling, SOB or lightheadedness with hypotension: Yes Has patient had a PCN reaction causing severe rash involving mucus membranes or skin necrosis: No Has patient had a PCN reaction that required hospitalization No Has patient had a PCN reaction occurring within the last 10 years: No    Family History  Problem Relation Age of Onset  . Hypertension Mother   . Heart failure Mother   . Heart failure Father   . Stroke Father   . Diabetes Father     Prior to Admission medications   Medication Sig Start Date End Date Taking? Authorizing Provider  acetaminophen (TYLENOL) 325 MG tablet Take 650 mg by mouth every 6 (six) hours as needed for mild pain, fever or headache.   Yes [provider]  apixaban (ELIQUIS) 5 MG TABS tablet Take 1 tablet (5 mg total) by mouth 2 (two) times daily. 03/25/19  Yes Guilford Shi, MD  atorvastatin (LIPITOR) 80 MG tablet Take 80 mg by mouth at bedtime.    Yes [provider]  clopidogrel (PLAVIX) 75 MG tablet Take 75 mg by mouth daily.    Yes [provider]  furosemide (LASIX) 20 MG tablet Take 3 tablets (60 mg total) by mouth 2 (two) times daily. 04/27/19  Yes Shelly Coss, MD  isosorbide mononitrate (IMDUR) 30 MG 24 hr tablet Take 3 tablets (90 mg total) by mouth daily. 03/25/19  Yes Guilford Shi, MD  LEVEMIR FLEXTOUCH 100 UNIT/ML Pen Inject 35 Units into the skin at bedtime.  03/15/16  Yes [provider]  metFORMIN (GLUCOPHAGE) 1000 MG tablet Take 1,000 mg by mouth 2 (two) times daily with a meal.   Yes [provider]  metoprolol succinate (TOPROL-XL) 100 MG 24 hr tablet TAKE 1 AND 1/2 TABLETS BY MOUTH  DAILY Patient taking differently: Take 150 mg by mouth every evening.  03/25/19  Yes Alessandra Bevels, MD  nitroGLYCERIN (NITROSTAT) 0.4 MG SL tablet Place 0.4 mg under the tongue every 5 (five) minutes as needed for chest pain.  05/15/18  Yes [provider]  pantoprazole (PROTONIX) 40 MG tablet Take 1 tablet (40 mg total) by mouth daily. 12/21/18  Yes Lennette Bihari, MD  potassium chloride 20 MEQ TBCR Take 40 mEq by mouth daily. Patient taking differently: Take 20 mEq by mouth daily.  04/27/19  Yes Adhikari, Willia Craze, MD  VENTOLIN HFA 108 (90 Base) MCG/ACT inhaler Inhale 1 puff into the lungs every 4 (four) hours as needed for shortness of breath. 03/02/18  Yes [provider]    Physical Exam: Vitals:   05/04/19 1300 05/04/19 1400 05/04/19 1500 05/04/19 1617  BP: 140/73 (!) 107/47 (!) 113/55 125/89  Pulse: 75 68 68 76  Resp:  18 17 17   Temp:      TempSrc:      SpO2: 100% 95% 96% 99%  Weight:      Height:         . General:  Appears calm and comfortable and is NAD; appears sedentary . Eyes:  PERRL, EOMI, normal lids, iris . ENT:  grossly normal hearing, lips & tongue, mmm . Neck:  no LAD, masses or thyromegaly . Cardiovascular:  RRR, no m/r/g.  2-3+ LE edema.  Marland Kitchen Respiratory:   CTA bilaterally with no wheezes/rales/rhonchi.  Normal respiratory effort. . Abdomen:  soft, NT, ND, NABS . Skin:  L > R stasis dermatitis with large bulla on left lower leg       . Musculoskeletal:  grossly normal tone BUE/BLE, good ROM, no bony abnormality, s/p R 1st and 2nd toe amputation . Psychiatric:  grossly normal mood and affect, speech fluent and appropriate, AOx3 . Neurologic:  CN 2-12 grossly intact, moves all extremities in coordinated fashion    Radiological Exams on Admission: CT ABDOMEN PELVIS WO CONTRAST  Result Date: 05/04/2019 CLINICAL DATA:  Abdominal pain, nausea, vomiting and diarrhea x3 days. EXAM: CT ABDOMEN AND PELVIS WITHOUT CONTRAST TECHNIQUE: Multidetector  CT imaging of the abdomen and pelvis was performed following the standard protocol without IV contrast. COMPARISON:  None. FINDINGS: Lower chest: Mild atelectasis and/or infiltrate is seen within the posterior aspect of the right lung base. There is a small right pleural effusion. Hepatobiliary: No focal liver abnormality is seen. Tiny gallstones are seen within the lumen of an otherwise normal-appearing gallbladder. Pancreas: Unremarkable. No pancreatic ductal dilatation or surrounding inflammatory changes. Spleen: Normal in size without focal abnormality. Adrenals/Urinary Tract: Adrenal glands are unremarkable. Kidneys are normal, without renal calculi, focal lesion, or hydronephrosis. Bladder is unremarkable. Stomach/Bowel: Stomach is within normal limits. The appendix is absent. No evidence of bowel dilatation. Mild diffuse thickening of the sigmoid colon is seen. Noninflamed diverticula are seen within the descending and proximal sigmoid colon. Vascular/Lymphatic: There is marked severity aortic calcification. Marked severity calcification of the mesenteric arteries is seen within the upper abdomen. A right common iliac artery stent is seen. No enlarged abdominal or pelvic lymph nodes. Reproductive: Status post hysterectomy. No adnexal masses. Other: No abdominal wall hernia or abnormality. No abdominopelvic ascites. Musculoskeletal: Multilevel degenerative changes seen throughout the lumbar spine. IMPRESSION: 1. Mild atelectasis and/or infiltrate within the posterior right lung base with associated small right pleural effusion. 2. Cholelithiasis without evidence for cholecystitis. 3. Noninflamed diverticula within the descending and proximal sigmoid colon. 4. Mild diffuse thickening of the sigmoid colon which may be, in part secondary  to poor bowel distention. Mild colitis within this region cannot completely be excluded. Aortic Atherosclerosis (ICD10-I70.0). Electronically Signed   By: Aram Candela M.D.    On: 05/04/2019 03:23    EKG: Independently reviewed.  NSR with rate 70; marked nonspecific ST changes but NSCSLT   Labs on Admission: I have personally reviewed the available labs and imaging studies at the time of the admission.  Pertinent labs:   K+ 7.2 -> 7.3 -> 5.7 CO2 18 -> 15 Glucose 117 BUN 70/Creatinine 2.69/GFR 17 -> 73/2.57/18; 44/1.72/30 on 3/30 AP 207 AST 103/ALT 70 WBC 10.0 Hgb 10.0 Platelets 411 UA: small Hgb, trace LE, 30 protein COVID pending   Assessment/Plan Principal Problem:   Acute kidney injury superimposed on CKD (HCC) Active Problems:   Diabetes mellitus type II, uncontrolled (HCC)   Hyperlipidemia with target LDL less than 70   Benign hypertension   Stage 3b chronic kidney disease   Hyperkalemia   Chronic a-fib (HCC)   Nausea vomiting and diarrhea   AKI on stage 3b CKD -Baseline creatinine was 1.72 at the time of last d/c   -Today's creatinine is 2.69 (improved already to 2.57) and GFR is 17-18  -Likely due to prerenal failure secondary to dehydration associated with n/v/d and continuation of Lasix. -Hold diuretics for now -IVF  -Follow up renal function by BMP -Avoid ACEI and NSAIDs -Will admit for ongoing monitoring  Hyperkalemia -Likely associated with dehydration  -She was treated with albuterol; insulin/D50; bicarb; and IVF -Much improved on repeat -Will recheck in AM after ongoing hydration  N/v/d -Symptoms sound like a viral gastroenteritis and symptoms are already improved -Will continue to follow -CT showed possible mild colitis, which is consistent -CT also with cholelithiasis without cholecystitis as well as diverticulosis; outpatient f/u is appropriate unless new symptoms arise  Chronic combined congestive heart failure:  -Patient was recently hospitalized for this issue -She reports medication changes that may be related, as her Lasix was increased during prior hospitalization -However, it may simply be coincidental  that her diuretic was increased just before she developed a gastroenteritis -She is dry at this time but with hydration her volume status will need to be closely monitored; will monitor on telemetry -Recent Echo on 2/21 with EF 25-30% and grade 3 diastolic dysfunction -She will likely need to resume Lasix tomorrow, or sooner if she develops respiratory complaints  L > R stasis dermatitis -She was previously treated for cellulitis during her last hospitalization -She does not appear to be infected at this time, but rather with stasis dermatitis with bulla formation -Wound care consultation requested -She received 1 dose of Vanc/Cefepime/Flagyl in the ER but will not continue at this time  CAD/PAD -Continue Plavix and Imdur -No current concern for ACS  HLD  -Continue Lipitor  DM -Will check A1c -Continue Levemir -Hold Glucophage -Cover with moderate-scale SSI  HTN -Continue Toprol XL  Afib -Rate controlled with Toprol -Continue Eliquis for [facility]  Debility/deconditioning -Patient was recommended for SNF placement during last hospitalization and declined -She also declined home health services -She continues to report that she does not need these services, but also notes that she is increasingly having ambulatory difficulty -Will again request PT/OT consultation    Note: This patient has been tested and is negative for the novel coronavirus COVID-19.  DVT prophylaxis: Eliquis Code Status:  Full - confirmed with patient Family Communication: None present; I spoke with the patient's daughter by telephone at the time of admission. Disposition Plan: She  is anticipated to d/c to home without [facility] services (although that is based on her refusal rather than need so perhaps she will change her mind) once her volume issues have been resolved. Consults called: Wound care; PT/OT  Admission status: Admit - It is my clinical opinion that admission to INPATIENT is reasonable and necessary  because of the expectation that this patient will require hospital care that crosses at least 2 midnights to treat this condition based on the medical complexity of the problems presented.  Given the aforementioned information, the predictability of an adverse outcome is felt to be significant.    Jonah Blue MD [facility]   How to contact the [facility] Attending or Consulting provider 7A - 7P or covering provider during after hours 7P -7A, for this patient?  1. Check the care team in [facility] and look for a) attending/consulting TR[facility] provider listed and b) the [facility] team listed 2. Log into [email or web address] and use [place]'s universal password to access. If you do not have the password, please contact the hospital operator. 3. Locate the [facility] provider you are looking for under [facility] and page to a number that you can be directly reached. 4. If you still have difficulty reaching the provider, please page the [facility] (Director on Call) for the Hospitalists listed on amion for assistance.   05/04/2019, 4:52 PM

## 2019-05-04 NOTE — ED Notes (Signed)
Ortho tech at bedside 

## 2019-05-04 NOTE — ED Notes (Signed)
Lunch Tray Ordered @ 1052.  

## 2019-05-04 NOTE — Consult Note (Signed)
WOC Nurse Consult Note: Patient receiving care in [facility] ED 25. Reason for Consult: LLE stasis ulcer Wound type: BLE affected by venous stasis disease.  She has never been told she needs to continually wear compression hose.  I explained the need for this today. Pressure Injury POA: Yes/No/NA Measurement: LLE with a burst bulla.  Opening to bulla measures 4 cm x 2 cm Wound bed: 100% pink Drainage (amount, consistency, odor) serous Periwound: slightly erythematous.  BLE edematous Dressing procedure/placement/frequency:    Place Xeoform gauze over opening on LLE, top with foam dressing, then call Ortho Tech for BLE unna boot placement. Next change date 05/11/19. Thank you for the consult.  Discussed plan of care with the patient and bedside nurse.  WOC nurse will not follow at this time.  Please re-consult the WOC team if needed.  Helmut Muster, RN, MSN, CWOCN, CNS-BC, pager [phone]

## 2019-05-04 NOTE — ED Notes (Signed)
Attempted to call nursing report.  

## 2019-05-04 NOTE — ED Notes (Signed)
Pt to and from CT via cart. 

## 2019-05-04 NOTE — Progress Notes (Signed)
Orthopedic Tech Progress Note Patient Details:  [name] [name] Apr 19, 1950 [id number]  Ortho Devices Type of Ortho Device: Radio broadcast assistant Ortho Device/Splint Location: BLE Ortho Device/Splint Interventions: Ordered, Application   Post Interventions Patient Tolerated: Well Instructions Provided: Care of device   Donald Pore 05/04/2019, 1:09 PM

## 2019-05-04 NOTE — ED Notes (Signed)
Ortho tech paged  

## 2019-05-05 LAB — BLOOD CULTURE ID PANEL (REFLEXED)

## 2019-05-05 LAB — BASIC METABOLIC PANEL
Anion gap: 11 (ref 5–15)
BUN: 64 mg/dL — ABNORMAL HIGH (ref 8–23)
CO2: 18 mmol/L — ABNORMAL LOW (ref 22–32)
Calcium: 8.6 mg/dL — ABNORMAL LOW (ref 8.9–10.3)
Chloride: 107 mmol/L (ref 98–111)
Creatinine, Ser: 2.44 mg/dL — ABNORMAL HIGH (ref 0.44–1.00)
GFR calc Af Amer: 23 mL/min — ABNORMAL LOW (ref >60)
GFR calc non Af Amer: 20 mL/min — ABNORMAL LOW (ref >60)
Glucose, Bld: 92 mg/dL (ref 70–99)
Potassium: 5.4 mmol/L — ABNORMAL HIGH (ref 3.5–5.1)
Sodium: 136 mmol/L (ref 135–145)

## 2019-05-05 LAB — GLUCOSE, CAPILLARY
Glucose-Capillary: 113 mg/dL — ABNORMAL HIGH (ref 70–99)
Glucose-Capillary: 176 mg/dL — ABNORMAL HIGH (ref 70–99)
Glucose-Capillary: 150 mg/dL — ABNORMAL HIGH (ref 70–99)

## 2019-05-05 LAB — CBC
HCT: 28.8 % — ABNORMAL LOW (ref 36.0–46.0)
Hemoglobin: 8.5 g/dL — ABNORMAL LOW (ref 12.0–15.0)
MCH: 22.8 pg — ABNORMAL LOW (ref 26.0–34.0)
MCHC: 29.5 g/dL — ABNORMAL LOW (ref 30.0–36.0)
MCV: 77.2 fL — ABNORMAL LOW (ref 80.0–100.0)
Platelets: 269 10*3/uL (ref 150–400)
RBC: 3.73 MIL/uL — ABNORMAL LOW (ref 3.87–5.11)
RDW: 18.1 % — ABNORMAL HIGH (ref 11.5–15.5)
WBC: 7.8 10*3/uL (ref 4.0–10.5)
nRBC: 0.3 % — ABNORMAL HIGH (ref 0.0–0.2)

## 2019-05-05 NOTE — Progress Notes (Signed)
P ? colitis Etiology not entirely clear-do not think infectious-felt nauseous earlier today attempt graduate to soft diet  in the next 24 hours and monitor tolerance of the same  AKI superimposed on CKD 3 Severe hyperkalemia on admission  continue saline 75 cc/H-potassium is coming down nicely on the monitors no peaking of T waves-hopeful for hyperkalemia to resolve with rehydration  CAD  systolic, diastolic HF-EF 20-25% grade 3 diastolic dysfunction 03/12/2019 Last admission was discharged on diuretics she may need to take you at the day and have closer more careful monitoring in the outpatient setting  Atrial fibrillation [place] score >4 on Eliquis Continue Eliquis 5 twice daily-predominantly in sinus bradycardia-continue metoprolol XL 150 every afternoon  HTN In addition to above continue Imdur 90 daily  DM TY 2 On Metformin prior to admission-GFR 20 and will not support this-May need very low-dose on discharge and/or change to GLP-1 inhibitor Sugar here 92-153  COPD, mild Apparently only on Ventolin inhalers-continue the same  PAD status post L lower extremity multiple procedures Continuing Plavix 75 daily  HLD Continue Lipitor 80 daily  Morbid obesity Life-threatening  Venous stasis ulcers Continue Unna boots dressings etc.  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 Laneta Simmers to be poor candidate for CABG), HLD, A. fib/Eliquis, COPD Last admitted 3/27 through 3/30  exacerbation HF--placed on Lasix 60 p.o. twice daily on discharge and follow-up was supposed to be scheduled with cardiology  Readmit c N/V/D 3-4 days duration-malaise lethargy diarrhea and developed a huge blister on left lower extremity-recommended at last hospital stay home health skin of K7.2 on admission-->5.7 BUN/creatinine 70/2.6 (baseline 1.7) AST ALT 103/70 WBC 10 hemoglobin 10 CT abdomen pelvis on admit mild diffuse thickening sigmoid?  Colitis    Awake alert pleasant coherent tells me does not typically use nasal cannula oxygen at home no chest pain fever felt nauseous this morning on liquid diet has had diarrhea which started after hospitalization which progressed to nausea but is not having diarrhea now  O/e BP 122/88 (BP Location: Left Arm)   Pulse 92   Temp (!) 97.5 F (36.4 C) (Oral)   Resp 18   Ht 5\' 4"  (1.626 m)   Wt 78.5 kg   SpO2 95%   BMI 29.70 kg/m  Potassium 7.3-->5.4 BUN/creatinine 70/2.6-->64/2.4, bicarb 18 Hemoglobin 10.0-->8.5 Platelet 269 CBG 92-->150  EOMI NCAT no focal deficit thick neck no JVD S1-S2 no murmur seems to be in sinus, sinus bradycardia Abdomen is soft with no rebound no guarding ROM intact to lower extremities upper extremities with gross neurological motor function-she has Unna boots on and I did not assess her feet today Affect is somewhat flat

## 2019-05-05 NOTE — Discharge Instructions (Signed)
Information on my medicine - ELIQUIS® (apixaban) ° °This medication education was reviewed with me or my healthcare representative as part of my discharge preparation.  The pharmacist that spoke with me during my hospital stay was:  Naeem Quillin Rhea, RPH ° °Why was Eliquis® prescribed for you? °Eliquis® was prescribed for you to reduce the risk of a blood clot forming that can cause a stroke if you have a medical condition called atrial fibrillation (a type of irregular heartbeat). ° °What do You need to know about Eliquis® ? °Take your Eliquis® TWICE DAILY - one tablet in the morning and one tablet in the evening with or without food. If you have difficulty swallowing the tablet whole please discuss with your pharmacist how to take the medication safely. ° °Take Eliquis® exactly as prescribed by your doctor and DO NOT stop taking Eliquis® without talking to the doctor who prescribed the medication.  Stopping may increase your risk of developing a stroke.  Refill your prescription before you run out. ° °After discharge, you should have regular check-up appointments with your healthcare provider that is prescribing your Eliquis®.  In the future your dose may need to be changed if your kidney function or weight changes by a significant amount or as you get older. ° °What do you do if you miss a dose? °If you miss a dose, take it as soon as you remember on the same day and resume taking twice daily.  Do not take more than one dose of ELIQUIS at the same time to make up a missed dose. ° °Important Safety Information °A possible side effect of Eliquis® is bleeding. You should call your healthcare provider right away if you experience any of the following: °  Bleeding from an injury or your nose that does not stop. °  Unusual colored urine (red or dark brown) or unusual colored stools (red or black). °  Unusual bruising for unknown reasons. °  A serious fall or if you hit your head (even if there is no  bleeding). ° °Some medicines may interact with Eliquis® and might increase your risk of bleeding or clotting while on Eliquis®. To help avoid this, consult your healthcare provider or pharmacist prior to using any new prescription or non-prescription medications, including herbals, vitamins, non-steroidal anti-inflammatory drugs (NSAIDs) and supplements. ° °This website has more information on Eliquis® (apixaban): http://www.eliquis.com/eliquis/home ° °

## 2019-05-05 NOTE — Progress Notes (Signed)
PHARMACY - PHYSICIAN COMMUNICATION CRITICAL VALUE ALERT - BLOOD CULTURE IDENTIFICATION (BCID)  [name] [name] is an 69 y.o. female who presented to [facility] on [facility]/03/2019 with a chief complaint of abd pain  Assessment:  She presented with abd pain and N/V. She was recently placed on Keflex for cellulitis. MD didn't think cellulitis was an issue at this time. Lab called with BCID result of 1/4 bottles with likely CNS contaminant. Will FYI message MD.   Name of physician (or Provider) Contacted: Dr Mahala Menghini  Current antibiotics: None  Changes to prescribed antibiotics recommended:  None  Results for orders placed or performed during the hospital encounter of 05/03/19  Blood Culture ID Panel (Reflexed) (Collected: 05/04/2019  4:21 AM)  Result Value Ref Range   Enterococcus species NOT DETECTED NOT DETECTED   Listeria monocytogenes NOT DETECTED NOT DETECTED   Staphylococcus species DETECTED (A) NOT DETECTED   Staphylococcus aureus (BCID) NOT DETECTED NOT DETECTED   Methicillin resistance DETECTED (A) NOT DETECTED   Streptococcus species NOT DETECTED NOT DETECTED   Streptococcus agalactiae NOT DETECTED NOT DETECTED   Streptococcus pneumoniae NOT DETECTED NOT DETECTED   Streptococcus pyogenes NOT DETECTED NOT DETECTED   Acinetobacter baumannii NOT DETECTED NOT DETECTED   Enterobacteriaceae species NOT DETECTED NOT DETECTED   Enterobacter cloacae complex NOT DETECTED NOT DETECTED   Escherichia coli NOT DETECTED NOT DETECTED   Klebsiella oxytoca NOT DETECTED NOT DETECTED   Klebsiella pneumoniae NOT DETECTED NOT DETECTED   Proteus species NOT DETECTED NOT DETECTED   Serratia marcescens NOT DETECTED NOT DETECTED   Haemophilus influenzae NOT DETECTED NOT DETECTED   Neisseria meningitidis NOT DETECTED NOT DETECTED   Pseudomonas aeruginosa NOT DETECTED NOT DETECTED   Candida albicans NOT DETECTED NOT DETECTED   Candida glabrata NOT DETECTED NOT DETECTED   Candida krusei NOT DETECTED NOT  DETECTED   Candida parapsilosis NOT DETECTED NOT DETECTED   Candida tropicalis NOT DETECTED NOT DETECTED    Ulyses Southward, PharmD, BCIDP, AAHIVP, CPP Infectious Disease Pharmacist 05/05/2019 9:25 AM

## 2019-05-05 NOTE — Evaluation (Signed)
Physical Therapy Evaluation Patient Details Name: [name] [name] MRN: [id number] DOB: 11/21/1950 Today's Date: 05/05/2019   History of Present Illness  69 y.o. female w/ PMHx of DM; HTN; CKD IIIB; CAD; PVD; chronic combined CHF; CVA; HLD; afib on Eliquis; and COPD presented with abdominal pain with n/v/d.  Pt last admitted 3/27-30 for acute on chronic combined CHF with LE cellulitis.  She was recommended to d/c to SNF but declined SNF or [facility] services.  She reports n/v/d for 3-4 days. Admitted with acute kidney injury superimposed on CKD.  Clinical Impression   Pt admitted with above diagnosis. PTA states was living at home with daughter, she was ambulatory with walker, she attempted to do as much as she could functionally but when she need help her daughter was able to help. As per chart daughter works hence pt may be home alone at times. Pt currently with functional limitations due to the deficits listed below (see PT Problem List). This pm pt was moving slowly but was able to complete bed mob, sit edge of bed with stand by assist, and also transfer to recliner with mod a. Noted pt had some pain in BLE with mobility. Pt was on room air and vitals were stable with sats in 90s. Pt will benefit from skilled PT to increase her overall strength, balance and coordination, activity tolerance,  independence and safety with mobility to allow discharge to the venue listed below. At d/c pt will benefit from continued rehabilitation for return to [facility], pt would benefit from stay in a rehab facility but since she has been declining this she may also do well with home health, but currently she will need out of bed assistance.      Follow Up Recommendations Supervision for mobility/OOB;Home health PT;SNF    Equipment Recommendations  None recommended by PT    Recommendations for Other Services OT consult     Precautions / Restrictions Precautions Precautions: Fall Precaution Comments: BLE w/ unna boots c/o pain  in both Restrictions Weight Bearing Restrictions: No      Mobility  Bed Mobility Overal bed mobility: Needs Assistance Bed Mobility: Rolling;Supine to Sit Rolling: Min assist   Supine to sit: HOB elevated;Mod assist     General bed mobility comments: needed bed fixtures, set up and mod a to complete  Transfers Overall transfer level: Needs assistance Equipment used: None;1 person hand held assist Transfers: Sit to/from [employer] Transfers Sit to Stand: Mod assist Stand pivot transfers: Mod assist       General transfer comment: needed mod a to get from bed to recliner, grimmacing in pain in BLE   Ambulation/Gait             General Gait Details: did not attempt yet sec to pain in BLE  Stairs            Wheelchair Mobility    Modified Rankin (Stroke Patients Only)       Balance Overall balance assessment: Needs assistance Sitting-balance support: Feet supported;Bilateral upper extremity supported Sitting balance-Leahy Scale: Fair Sitting balance - Comments: needs SBA for safety   Standing balance support: During functional activity;Single extremity supported Standing balance-Leahy Scale: Poor Standing balance comment: mod a to complete, UE needed sec to BLE pain and weakness                             Pertinent Vitals/Pain Pain Assessment: Faces Faces Pain Scale: Hurts whole lot  Pain Location: w/ touch and movement in BLE Pain Descriptors / Indicators: Grimacing;Guarding Pain Intervention(s): Limited activity within patient's tolerance    Home Living Family/patient expects to be discharged to:: Private residence Living Arrangements: Children Available Help at Discharge: Family Type of Home: House Home Access: Stairs to enter;Ramped entrance Entrance Stairs-Rails: Left Entrance Stairs-Number of Steps: 3 Home Layout: One level Home Equipment: Walker - 2 wheels;Walker - 4 wheels;Cane - single point;Bedside commode;Tub  bench      Prior Function Level of Independence: Needs assistance   Gait / Transfers Assistance Needed: ambulates with Rollator  ADL's / Homemaking Assistance Needed: states that she attempts what she can and daughter helps with rest  Comments: reports she lives with her daughter who works part time      Higher education careers adviser   Dominant Hand: Right    Extremity/Trunk Assessment   Upper Extremity Assessment Upper Extremity Assessment: Defer to OT evaluation    Lower Extremity Assessment Lower Extremity Assessment: Generalized weakness    Cervical / Trunk Assessment Cervical / Trunk Assessment: Kyphotic  Communication   Communication: No difficulties  Cognition Arousal/Alertness: Awake/alert Behavior During Therapy: WFL for tasks assessed/performed Overall Cognitive Status: Within Functional Limits for tasks assessed                                 General Comments: pt is very pleasant and appologetic for being difficult earlier      General Comments General comments (skin integrity, edema, etc.): pt on room air VSS    Exercises     Assessment/Plan    PT Assessment Patient needs continued PT services  PT Problem List Decreased strength;Decreased activity tolerance;Decreased balance;Decreased mobility;Decreased coordination;Decreased knowledge of use of DME;Decreased safety awareness       PT Treatment Interventions DME instruction;Gait training;Functional mobility training;Therapeutic activities;Therapeutic exercise;Balance training;Neuromuscular re-education;Patient/family education    PT Goals (Current goals can be found in the Care Plan section)  Acute Rehab PT Goals Patient Stated Goal: did not voice goals at this time Time For Goal Achievement: 05/19/19 Potential to Achieve Goals: Fair    Frequency Min 3X/week   Barriers to discharge        Co-evaluation               AM-PAC PT "6 Clicks" Mobility  Outcome Measure Help needed  turning from your back to your side while in a flat bed without using bedrails?: A Little Help needed moving from lying on your back to sitting on the side of a flat bed without using bedrails?: A Lot Help needed moving to and from a bed to a chair (including a wheelchair)?: A Lot Help needed standing up from a chair using your arms (e.g., wheelchair or bedside chair)?: A Lot Help needed to walk in hospital room?: A Lot Help needed climbing 3-5 steps with a railing? : A Lot 6 Click Score: 13    End of Session   Activity Tolerance: Patient limited by pain;Patient limited by fatigue;Patient tolerated treatment well Patient left: in chair;with call bell/phone within reach Nurse Communication: Mobility status PT Visit Diagnosis: Other abnormalities of gait and mobility (R26.89);Muscle weakness (generalized) (M62.81)    Time: 8101-7510 PT Time Calculation (min) (ACUTE ONLY): 18 min   Charges:   PT Evaluation $PT Eval Moderate Complexity: 1 Mod          Drema Pry, PT   Freddi Starr 05/05/2019, 3:37 PM

## 2019-05-05 NOTE — Evaluation (Signed)
Occupational Therapy Evaluation Patient Details Name: [name] [name] MRN: [id number] DOB: Nov 16, 1950 Today's Date: 05/05/2019    History of Present Illness 69 y.o. female w/ PMHx of DM; HTN; CKD IIIB; CAD; PVD; chronic combined CHF; CVA; HLD; afib on Eliquis; and COPD presented with abdominal pain with n/v/d.  Pt last admitted 3/27-30 for acute on chronic combined CHF with LE cellulitis.  She was recommended to d/c to SNF but declined SNF or [facility] services.  She reports n/v/d for 3-4 days. Admitted with acute kidney injury superimposed on CKD.   Clinical Impression   Pt admitted with above diagnoses, presents after recent admission with SNF recommendation, then [facility] which patient has refused all follow up. Pt up in chair throughout session but limited in desire to work with OT. OT discussed and educated on importance of therapy, home safety and ECS. Pt with DOE with minimal activity, had her try pursed lip breathing with success. Continue to recommend SNF. Will continue to follow.    Follow Up Recommendations  SNF;Supervision/Assistance - 24 hour;Other (comment)(pt will likely continue to refuse)    Equipment Recommendations  None recommended by OT    Recommendations for Other Services       Precautions / Restrictions Precautions Precautions: Fall Precaution Comments: BLE weeping wounds, wrapped Restrictions Weight Bearing Restrictions: No      Mobility Bed Mobility Overal bed mobility: Needs Assistance Bed Mobility: Rolling;Supine to Sit Rolling: Min assist   Supine to sit: HOB elevated;Mod assist     General bed mobility comments: up in chair  Transfers Overall transfer level: Needs assistance Equipment used: None;1 person hand held assist Transfers: Sit to/from [employer] Transfers Sit to Stand: Mod assist Stand pivot transfers: Mod assist       General transfer comment: several attempts from therapist, pt refusing to practice    Balance Overall balance  assessment: Needs assistance Sitting-balance support: Feet supported;Bilateral upper extremity supported Sitting balance-Leahy Scale: Fair Sitting balance - Comments: needs SBA for safety   Standing balance support: During functional activity;Single extremity supported Standing balance-Leahy Scale: Poor Standing balance comment: mod a to complete, UE needed sec to BLE pain and weakness                           ADL either performed or assessed with clinical judgement   ADL Overall ADL's : Needs assistance/impaired Eating/Feeding: Set up;Sitting   Grooming: Set up;Sitting   Upper Body Bathing: Set up;Sitting   Lower Body Bathing: Maximal assistance;Total assistance;Sitting/lateral leans;Sit to/from stand   Upper Body Dressing : Set up;Sitting   Lower Body Dressing: Maximal assistance;Total assistance;Sitting/lateral leans;Sit to/from stand   Toilet Transfer: Moderate assistance;Squat-pivot             General ADL Comments: pt requires max encouragement to participate     Vision Patient Visual Report: No change from baseline       Perception     Praxis      Pertinent Vitals/Pain Pain Assessment: Faces Faces Pain Scale: Hurts whole lot Pain Location: w/ touch and movement in BLE Pain Descriptors / Indicators: Grimacing;Guarding Pain Intervention(s): Limited activity within patient's tolerance     Hand Dominance Right   Extremity/Trunk Assessment Upper Extremity Assessment Upper Extremity Assessment: Generalized weakness RUE Deficits / Details: limited AROM in bil shoulders (grossly 0-90*) LUE Deficits / Details: limited AROM in bil shoulders (grossly 0-90*)   Lower Extremity Assessment Lower Extremity Assessment: Generalized weakness(bil wraps due to weeping  cellulitis)   Cervical / Trunk Assessment Cervical / Trunk Assessment: Kyphotic   Communication Communication Communication: No difficulties   Cognition Arousal/Alertness:  Awake/alert Behavior During Therapy: WFL for tasks assessed/performed Overall Cognitive Status: No family/caregiver present to determine baseline cognitive functioning Area of Impairment: Safety/judgement                         Safety/Judgement: Decreased awareness of safety;Decreased awareness of deficits     General Comments: pt is overall pleasent but not accepting to therapy suggestions. She has had multiple readmissions but will not accept post acute help. She then stated she wanted to get more exercise at home, again reiterated HHOT/HHPT and pt stated "I am just not sure I need that"   General Comments  pt on room air VSS    Exercises     Shoulder Instructions      Home Living Family/patient expects to be discharged to:: Private residence Living Arrangements: Children Available Help at Discharge: Family Type of Home: House Home Access: Stairs to enter;Ramped entrance Entrance Stairs-Number of Steps: 3 Entrance Stairs-Rails: Left Home Layout: One level     Bathroom Shower/Tub: Chief Strategy Officer: Handicapped height Bathroom Accessibility: No   Home Equipment: Environmental consultant - 2 wheels;Walker - 4 wheels;Cane - single point;Bedside commode;Tub bench          Prior Functioning/Environment Level of Independence: Needs assistance  Gait / Transfers Assistance Needed: uses rollator around the house mostly, occasionally uses w/c ADL's / Homemaking Assistance Needed: states that she attempts what she can and daughter helps with rest Communication / Swallowing Assistance Needed: no problem Comments: reports she lives with her daughter who works part time         OT Problem List: Decreased strength;Decreased range of motion;Decreased activity tolerance;Impaired balance (sitting and/or standing);Pain;Increased edema;Decreased knowledge of use of DME or AE;Decreased knowledge of precautions;Cardiopulmonary status limiting activity;Decreased safety  awareness      OT Treatment/Interventions: Self-care/ADL training;Therapeutic exercise;Energy conservation;DME and/or AE instruction;Therapeutic activities;Patient/family education;Balance training    OT Goals(Current goals can be found in the care plan section) Acute Rehab OT Goals Patient Stated Goal: none stated OT Goal Formulation: With patient Time For Goal Achievement: 05/19/19 Potential to Achieve Goals: Good  OT Frequency: Min 2X/week   Barriers to D/C:            Co-evaluation              AM-PAC OT "6 Clicks" Daily Activity     Outcome Measure Help from another person eating meals?: A Little Help from another person taking care of personal grooming?: A Little Help from another person toileting, which includes using toliet, bedpan, or urinal?: A Lot Help from another person bathing (including washing, rinsing, drying)?: A Lot Help from another person to put on and taking off regular upper body clothing?: A Little Help from another person to put on and taking off regular lower body clothing?: Total 6 Click Score: 14   End of Session Nurse Communication: Mobility status  Activity Tolerance: Patient limited by fatigue Patient left: in chair;with call bell/phone within reach  OT Visit Diagnosis: Muscle weakness (generalized) (M62.81);Other abnormalities of gait and mobility (R26.89)                Time: 6295-2841 OT Time Calculation (min): 10 min Charges:  OT General Charges $OT Visit: 1 Visit OT Evaluation $OT Eval Moderate Complexity: 1 Mod  Dalphine Handing, MSOT, OTR/L Acute  Rehabilitation Services [facility] Office Number: [phone] Pager: [phone]  Zenovia Jarred 05/05/2019, 5:40 PM

## 2019-05-06 LAB — CBC WITH DIFFERENTIAL/PLATELET
Abs Immature Granulocytes: 0.02 10*3/uL (ref 0.00–0.07)
Basophils Absolute: 0 10*3/uL (ref 0.0–0.1)
Basophils Relative: 1 %
Eosinophils Absolute: 0.5 10*3/uL (ref 0.0–0.5)
Eosinophils Relative: 7 %
HCT: 27.5 % — ABNORMAL LOW (ref 36.0–46.0)
Hemoglobin: 8.3 g/dL — ABNORMAL LOW (ref 12.0–15.0)
Immature Granulocytes: 0 %
Lymphocytes Relative: 21 %
Lymphs Abs: 1.4 10*3/uL (ref 0.7–4.0)
MCH: 23.2 pg — ABNORMAL LOW (ref 26.0–34.0)
MCHC: 30.2 g/dL (ref 30.0–36.0)
MCV: 76.8 fL — ABNORMAL LOW (ref 80.0–100.0)
Monocytes Absolute: 0.8 10*3/uL (ref 0.1–1.0)
Monocytes Relative: 12 %
Neutro Abs: 4.1 10*3/uL (ref 1.7–7.7)
Neutrophils Relative %: 59 %
Platelets: 244 10*3/uL (ref 150–400)
RBC: 3.58 MIL/uL — ABNORMAL LOW (ref 3.87–5.11)
RDW: 17.8 % — ABNORMAL HIGH (ref 11.5–15.5)
WBC: 6.8 10*3/uL (ref 4.0–10.5)
nRBC: 0.4 % — ABNORMAL HIGH (ref 0.0–0.2)

## 2019-05-06 LAB — RENAL FUNCTION PANEL
Albumin: 2.7 g/dL — ABNORMAL LOW (ref 3.5–5.0)
Anion gap: 10 (ref 5–15)
BUN: 60 mg/dL — ABNORMAL HIGH (ref 8–23)
CO2: 18 mmol/L — ABNORMAL LOW (ref 22–32)
Calcium: 8.2 mg/dL — ABNORMAL LOW (ref 8.9–10.3)
Chloride: 108 mmol/L (ref 98–111)
Creatinine, Ser: 2.37 mg/dL — ABNORMAL HIGH (ref 0.44–1.00)
GFR calc Af Amer: 23 mL/min — ABNORMAL LOW (ref >60)
GFR calc non Af Amer: 20 mL/min — ABNORMAL LOW (ref >60)
Glucose, Bld: 84 mg/dL (ref 70–99)
Phosphorus: 4.3 mg/dL (ref 2.5–4.6)
Potassium: 4.8 mmol/L (ref 3.5–5.1)
Sodium: 136 mmol/L (ref 135–145)

## 2019-05-06 LAB — GLUCOSE, CAPILLARY
Glucose-Capillary: 124 mg/dL — ABNORMAL HIGH (ref 70–99)
Glucose-Capillary: 81 mg/dL (ref 70–99)
Glucose-Capillary: 118 mg/dL — ABNORMAL HIGH (ref 70–99)
Glucose-Capillary: 222 mg/dL — ABNORMAL HIGH (ref 70–99)

## 2019-05-06 MED ORDER — GLIPIZIDE 2.5 MG HALF TABLET
2.5000 mg | ORAL_TABLET | Freq: Every day | ORAL | Status: DC
  Filled 2019-05-06 (×3): qty 1

## 2019-05-06 NOTE — Progress Notes (Signed)
Orthopedic Tech Progress Note Patient Details:  [name] [name] 01/11/1951 [id number]  Ortho Devices Type of Ortho Device: Ace wrap, Unna boot Ortho Device/Splint Location: Bilateral unna boots Ortho Device/Splint Interventions: Application   Post Interventions Patient Tolerated: Well Instructions Provided: Care of device   Saul Fordyce 05/06/2019, 2:54 PM

## 2019-05-06 NOTE — Plan of Care (Signed)
  Problem: Education: Goal: Knowledge of General Education information will improve Description Including pain rating scale, medication(s)/side effects and non-pharmacologic comfort measures Outcome: Progressing   

## 2019-05-06 NOTE — TOC Initial Note (Signed)
Transition of Care [facility]) - Initial/Assessment Note    Patient Details  Name: [name] [name] MRN: [id number] Date of Birth: 09/18/1950  Transition of Care [facility]) CM/SW Contact:    Gala Lewandowsky, RN Phone Number: 05/06/2019, 2:51 PM  Clinical Narrative: High risk for readmission assessment completed. Patient presented for abdominal pain. Prior to arrival patient was from home with family support. Case Manager discussed home care options and patient is agreeable to home health registered nurse, physical therapy and an aide for bath. Medicare.gov list given to patient and her and daughter discussed agencies. Patient wants to use [facility] for services. Referral made to liaison Lupita Leash with [facility] for services. Start of care to begin within 24-48 hours post transition home. Patient will need orders for RN- wound care needs to be placed in comment section for Unna Boot changes x how often. Patient mentioned that she will need labs drawn in the home as well. If so, labs ordred and who to call results to need to be placed in comment section of order as well. Patient has primary care physician Dr. Donette Larry. Patient will need HH Order for RN, PT, Aide and F2F. Daughter will be in the home with the patient, she is currently working on getting FMLA. Patient will not need any durable medical equipment at this time. Case Manager will continue to follow for additional transition of care needs. Patient currently has on 02- Case Manager discussed with nursing staff to see if patient to be weaned-was not wearing oxygen prior to admission.         Expected Discharge Plan: Home w Home Health Services Barriers to Discharge: Continued Medical Work up   Patient Goals and CMS Choice Patient states their goals for this hospitalization and ongoing recovery are:: to return home and get stronger. CMS Medicare.gov Compare Post Acute Care list provided to:: Patient Choice offered to / list presented to :  Patient  Expected Discharge Plan and Services Expected Discharge Plan: Home w Home Health Services In-house Referral: NA Discharge Planning Services: CM Consult Post Acute Care Choice: Home Health Living arrangements for the past 2 months: Single Family Home                 DME Arranged: N/A     HH Arranged: RN, Disease Management, PT, Nurse's Aide, Refused SNF HH Agency: [facility] Date HH Agency Contacted: 05/06/19 Time HH Agency Contacted: [phone] Representative spoke with at [facility] Agency: Lupita Leash  Prior Living Arrangements/Services Living arrangements for the past 2 months: Single Family Home Lives with:: Adult Children Patient language and need for interpreter reviewed:: Yes Do you feel safe going back to the place where you live?: Yes      Need for Family Participation in Patient Care: Yes (Comment) Care giver support system in place?: Yes (comment) Current home services: DME(Patient has a rollator, shower chair, bsc, cane at home.) Criminal Activity/Legal Involvement Pertinent to Current Situation/Hospitalization: No - Comment as needed  Activities of Daily Living Home Assistive Devices/Equipment: Bedside commode/3-in-1, Walker (specify type), Wheelchair, Eyeglasses, Grab bars in shower ADL Screening (condition at time of admission) Patient's cognitive ability adequate to safely complete daily activities?: Yes Is the patient deaf or have difficulty hearing?: No Does the patient have difficulty seeing, even when wearing glasses/contacts?: No Does the patient have difficulty concentrating, remembering, or making decisions?: No Patient able to express need for assistance with ADLs?: Yes Does the patient have difficulty dressing or bathing?: No Independently performs  ADLs?: Yes (appropriate for developmental age) Does the patient have difficulty walking or climbing stairs?: Yes Weakness of Legs: Both Weakness of Arms/Hands: None  Permission  Sought/Granted Permission sought to share information with : Family Supports, Investment banker, corporate granted to share info w AGENCY: [place]   Emotional Assessment Appearance:: Appears stated age Attitude/Demeanor/Rapport: Engaged Affect (typically observed): Accepting Orientation: : Oriented to Self, Oriented to Place, Oriented to  Time, Oriented to Situation Alcohol / Substance Use: Not Applicable Psych Involvement: No (comment)  Admission diagnosis:  Hyperkalemia [E87.5] Colitis [K52.9] ARF (acute renal failure) (HCC) [N17.9] AKI (acute kidney injury) ([place]) [N17.9] HCAP (healthcare-associated pneumonia) [J18.9] Nausea vomiting and diarrhea [R11.2, R19.7] Acute kidney injury superimposed on chronic kidney disease ([place]) [N17.9, N18.9] Patient Active Problem List   Diagnosis Date Noted  . Acute kidney injury superimposed on CKD ([place]) 05/04/2019  . Nausea vomiting and diarrhea 05/04/2019  . CHF exacerbation ([place]) 04/24/2019  . Chronic a-fib ([place]) 04/24/2019  . Cellulitis 04/24/2019  . Vertigo 03/25/2019  . Atrial fibrillation with RVR ([place]) 03/20/2019  . Hyperkalemia   . Dyspnea 03/11/2019  . Elevated troponin 03/11/2019  . Chest pain 03/11/2019  . Acute on chronic combined systolic and diastolic CHF (congestive heart failure) ([place]) 05/08/2018  . Renal insufficiency 02/13/2018  . Stage 3b chronic kidney disease 02/13/2018  . Acute systolic CHF (congestive heart failure) ([place]) 02/13/2018  . Benign hypertension 11/26/2017  . Gangrene of right foot ([place]) 11/01/2016  . GERD (gastroesophageal reflux disease) 11/01/2016  . Gangrene of left foot ([place]) 10/17/2016  . Sepsis ([place]) 10/17/2016  . Midfoot ulcer, left, limited to breakdown of skin ([place]) 01/08/2016  . Thigh hematoma 12/22/2015  . History of blood transfusion 12/22/2015  . Acute on chronic systolic CHF (congestive heart failure) ([place]) 12/22/2015  . H/O skin graft 12/11/2015  .  SEMI (subendocardial myocardial infarction) ([place]) 11/19/2015  . DM type 2 with diabetic peripheral neuropathy ([place]) 11/19/2015  . Traumatic hemorrhagic shock ([place])   . Chronic systolic CHF (congestive heart failure) ([place])   . Uncontrolled type 2 diabetes mellitus with complication ([place])   . Demand myocardial infarction ([place]) 11/08/2015  . Surgery, elective   . Central line infection, initial encounter   . Septic shock ([place]) 11/03/2015  . Encephalopathy acute 11/03/2015  . Acute blood loss as cause of postoperative anemia 11/03/2015  . Acute respiratory failure with hypoxia ([place])   . CVA (cerebral vascular accident) ([place])   . S/P femoral-popliteal bypass surgery   . Malnutrition of moderate degree 11/01/2015  . Diabetic ulcer of left midfoot associated with diabetes mellitus due to underlying condition, with muscle involvement without evidence of necrosis ([place])   . Hypertensive heart disease with chronic diastolic congestive heart failure ([place]) 10/31/2015  . Osteomyelitis of left foot ([place]) 10/30/2015  . PVD (peripheral vascular disease) with claudication ([place]) 09/28/2015  . Critical lower limb ischemia 08/28/2015  . Hyperlipidemia with target LDL less than 70 06/28/2013  . Diabetes mellitus type II, uncontrolled ([place]) 03/03/2013  . Complicated migraine 43/15/4008  . Stroke-like symptom 03/02/2013  . Diabetes mellitus ([employer]) 03/02/2013  . Aphasia 03/02/2013  . Headache(784.0) 03/02/2013  . PAD (peripheral artery disease) ([place]) 06/05/2011  . Coronary artery disease involving native coronary artery of native heart without angina pectoris 06/05/2011  . Hypotension 06/05/2011  . Claudication in peripheral vascular disease:  Lifestyle limiting. 06/05/2011  . Hx of tobacco use, presenting hazards to health 06/05/2011   PCP:  Georgann Housekeeper, MD Pharmacy:   [facility] [phone] - [place], [place] - [address] AT [facility] [address] [place]  [address] Phone: [phone] Fax: [phone]   Readmission Risk Interventions Readmission Risk Prevention Plan 05/06/2019  Transportation Screening Complete  Medication Review (RN Care Manager) Complete  HRI or Home Care Consult Complete  SW Recovery Care/Counseling Consult Complete  Palliative Care Screening Not Applicable  Skilled Nursing Facility Complete  Some recent data might be hidden

## 2019-05-06 NOTE — Progress Notes (Addendum)
P ? colitis Etiology not entirely clear-tolerated soft diet today graduate to full regular diabetic diet and likely can discharge dependent  tolerance of the same  AKI superimposed on CKD 3 Severe hyperkalemia on admission Stop saline 75 cc/h has 2 L net positive-potassium improved  CAD  systolic, diastolic HF-EF 20-25% grade 3 diastolic dysfunction 03/12/2019 Last admission was discharged on diuretics--- resume lower dose at 40 once daily on discharge  Atrial fibrillation [place] score >4 on Eliquis Continue Eliquis 5 twice daily-predominantly in sinus bradycardia-continue metoprolol XL 150 every afternoon Can DC telemetry  HTN In addition to above continue Imdur 90 daily  DM TY 2 On Metformin prior to admission-GFR 20 and will not support this-start Glipizide2.5 as Hepatically cleared  Continue Levemir 35 units and sliding scale Sugar here well controlled   COPD, mild Apparently only on Ventolin inhalers-continue the same  PAD status post L lower extremity multiple procedures Continuing Plavix 75 daily  HLD Continue Lipitor 80 daily  Morbid obesity Life-threatening  Venous stasis ulcers Continue Unna boots dressings etc. Wounds were examined today and she will need these for the next 1 to 2 weeks to promote healing  Dilutional anemia with component of anemia chronic disease Will recheck as outpatient-has been getting fluids and has no reports of dark or tarry stools  Patient on Eliquis and Plavix for dual indications which will protect against DVT, full code, Disposition I have asked her to consider home health despite her refusal several times over the past couple of months-I have explained to her that she will likely discharge 4/9-she wishes to update her daughter herself  S  10 white female DM TY 2, HTN, CKD 3, CAD, PVD, combined systolic diastolic HF (cath 05/11/2018 multivessel stenosis-followed by Dr. Laneta Simmers to be poor candidate for CABG), HLD, A. fib/Eliquis,  COPD Last admitted 3/27 through 3/30 exacerbation HF--placed on Lasix 60 p.o. twice daily on discharge and follow-up was supposed to be scheduled with cardiology  Readmit c N/V/D 3-4 days duration-malaise lethargy diarrhea and developed a huge blister on left lower extremity-recommended at last hospital stay home health skin of K7.2 on admission-->5.7 BUN/creatinine 70/2.6 (baseline 1.7) AST ALT 103/70 WBC 10 hemoglobin 10 CT abdomen pelvis on admit mild diffuse thickening sigmoid?  Colitis    pleasant coherent no distress-on nasal cannula but sitting in chair Unna boots have been removed O/e BP 123/67 (BP Location: Left Arm)   Pulse 64   Temp 97.7 F (36.5 C) (Oral)   Resp 19   Ht 5\' 4"  (1.626 m)   Wt 81.6 kg   SpO2 100%   BMI 30.90 kg/m  Potassium 7.3-->5.4-->4.8 BUN/creatinineUsual baseline up to 2.2, 70/2.6-->64/2.4-->60/2.3, bicarb 18 Hemoglobin 10.0-->8.3 Platelet 269 CBG well controlled  EOMI NCAT no focal deficit thick neck no JVD S1-S2 no murmur  sinus bradycardia on monitors Abdomen is soft with no rebound no guarding ROM intact to lower extremities upper extremities with gross neurological motor function- Leg exam today shows a blood beneath the cuff of the dressing-

## 2019-05-07 ENCOUNTER — Ambulatory Visit: Payer: Medicare Other | Admitting: Physician Assistant

## 2019-05-07 LAB — CULTURE, BLOOD (ROUTINE X 2): Special Requests: ADEQUATE

## 2019-05-07 LAB — GLUCOSE, CAPILLARY
Glucose-Capillary: 81 mg/dL (ref 70–99)
Glucose-Capillary: 121 mg/dL — ABNORMAL HIGH (ref 70–99)

## 2019-05-07 MED ORDER — FUROSEMIDE 20 MG PO TABS: 40.0000 mg | ORAL_TABLET | Freq: Every day | ORAL | 1 refills | Status: DC

## 2019-05-07 MED ORDER — GLIPIZIDE 5 MG PO TABS: 2.5000 mg | ORAL_TABLET | Freq: Every day | ORAL | 3 refills | Status: AC

## 2019-05-07 NOTE — Plan of Care (Signed)
Pt d/c to home with daughter, d/c education complete, left facility via private vehicle.

## 2019-05-07 NOTE — Discharge Summary (Signed)
Physician Discharge Summary  [name] [name] ZOX:[id number] DOB: 1951/01/09 DOA: 05/03/2019  PCP: Georgann Housekeeper, MD  Admit date: 05/03/2019 Discharge date: 05/07/2019  Time spent: 45 minutes  Recommendations for Outpatient Follow-up:  1. Patient will need adjustment of meds based on labs at home health will perform labs and call them into Dr. Eula Listen 2. Unna boots to be placed and kept in place for the next 1 to 2 weeks and this can be extended if felt needed by primary care physician 3. Note discontinuation of Metformin and substitution of glipizide in this place in addition to change of Lasix dosing and discontinuation of aspirin this admission 4. Resume oxygen on discharge home  Discharge Diagnoses:  Principal Problem:   Acute kidney injury superimposed on CKD (HCC) Active Problems:   Diabetes mellitus type II, uncontrolled (HCC)   Hyperlipidemia with target LDL less than 70   Benign hypertension   Stage 3b chronic kidney disease   Hyperkalemia   Chronic a-fib (HCC)   Nausea vomiting and diarrhea   Discharge Condition: Guarded  Diet recommendation: Diabetic  Filed Weights   05/03/19 2001 05/06/19 0525 05/07/19 0545  Weight: 78.5 kg 81.6 kg 82.4 kg    History of present illness:  ? colitis Etiology not entirely clear-tolerated soft diet today graduate to full regular diabetic diet and likely can discharge dependent  tolerance of the same  AKI superimposed on CKD 3 Severe hyperkalemia on admission Stop saline 75 cc/h has 2 L net positive-potassium improved  CAD  systolic, diastolic HF-EF 20-25% grade 3 diastolic dysfunction 03/12/2019 Last admission was discharged on diuretics--- resume lower dose at 40 once daily on discharge  Atrial fibrillation [place] score >4 on Eliquis Continue Eliquis 5 twice daily-predominantly in sinus bradycardia-continue metoprolol XL 150 every afternoon Can DC telemetry  HTN In addition to above continue Imdur 90 daily  DM TY 2 On  Metformin prior to admission-GFR 20 and will not support this-start Glipizide2.5 as Hepatically cleared  Continue Levemir 35 units and sliding scale Sugar here well controlled   COPD, mild Apparently only on Ventolin inhalers-continue the same  PAD status post L lower extremity multiple procedures Continuing Plavix 75 daily  HLD Continue Lipitor 80 daily  Morbid obesity Life-threatening  Venous stasis ulcers Continue Unna boots dressings etc. 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 Laneta Simmers to be poor candidate for CABG), HLD, A. fib/Eliquis, COPD Last admitted 3/27 through 3/30 exacerbation HF--placed on Lasix 60 p.o. twice daily on discharge and follow-up was supposed to be scheduled with cardiology  Readmit c N/V/D 3-4 days duration-malaise lethargy diarrhea and developed a huge blister on left lower extremity-recommended at last hospital stay home health skin of K7.2 on admission-->5.7 BUN/creatinine 70/2.6 (baseline 1.7) AST ALT 103/70 WBC 10 hemoglobin 10 CT abdomen pelvis on admit mild diffuse thickening sigmoid?  Colitis    Discharge Exam: Vitals:   05/07/19 0359 05/07/19 0545  BP: 125/67 Marland Kitchen)  112/51  Pulse: 64 (!) 57  Resp:    Temp:  97.6 F (36.4 C)  SpO2: 100% 96%   She is awake alert and doing fair she has some pain in her lower extremities otherwise looking well feels well She has no  chest pain fever or chills at this time   General: Awake alert coherent no distress EOMI NCAT no focal deficit sitting up in bed with no new issues Cardiovascular: S1-S2 no murmur rub or gallop Respiratory: Clinically clear no added sound no rales no rhonchi Moving all 4 extremities equally smile symmetric on oxygen  Discharge Instructions   Discharge Instructions    Diet - low sodium heart healthy   Complete by: As directed    Discharge instructions   Complete by: As directed    You will need to have labs performed in the outpatient setting at home with home health and will need to have Unna boots changed quite regularly up to 3 times in the next 15 to 20 days It is recommended you take your medications as they have changed and according to the changes-would recommend that you get Lasix only once a day with a much lower dose and adjustment of your electrolytes and medications based on labs in the next week as we have discussed For your restless leg-like symptoms you can continue the mustard but you may want to use once you see your primary physician in a week Requip and he can discuss the use of this with you If you have not had a colonoscopy within the past 10 years this will need to be arranged for you as well as your blood counts went down a little bit think it was because of dilution of your blood as he received fluids in the hospital Notice that we have stopped your Metformin because of your kidney function in addition to stopping your potassium because of the high levels  Dr. Drue Novel is the person to adjust further your meds and make some decisions with you once again--   Increase activity slowly   Complete by: As directed      Allergies as of 05/07/2019      Reactions   Doxycycline Other (See Comments)   Lethargy   Hydrochlorothiazide Other (See Comments)   Lethargy   Latex Rash   Penicillins Swelling, Rash   Pt states she has tolerated Keflex in the past without problems. States  she may have tolerated Augmentin in the past but it caused GI upset. Has patient had a PCN reaction causing immediate rash, facial/tongue/throat swelling, SOB or lightheadedness with hypotension: Yes Has patient had a PCN reaction causing severe rash involving mucus membranes or skin necrosis: No Has patient had a PCN reaction that required hospitalization No Has patient had a PCN reaction occurring within the last 10 years: No      Medication List    STOP taking these medications   metFORMIN 1000 MG tablet Commonly known as: GLUCOPHAGE   Potassium Chloride ER 20 MEQ Tbcr     TAKE these medications   acetaminophen 325 MG tablet Commonly known as: TYLENOL Take 650 mg by mouth every 6 (six) hours as needed for mild pain, fever or headache.   apixaban 5 MG Tabs tablet Commonly known as: ELIQUIS Take 1 tablet (5 mg total) by mouth 2 (two) times daily.   atorvastatin 80 MG tablet Commonly known as: LIPITOR Take 80 mg by mouth at bedtime.   clopidogrel 75 MG tablet Commonly known as:  PLAVIX Take 75 mg by mouth daily.   furosemide 20 MG tablet Commonly known as: LASIX Take 2 tablets (40 mg total) by mouth daily. What changed:   how much to take  when to take this   glipiZIDE 5 MG tablet Commonly known as: GLUCOTROL Take 0.5 tablets (2.5 mg total) by mouth daily before breakfast.   isosorbide mononitrate 30 MG 24 hr tablet Commonly known as: IMDUR Take 3 tablets (90 mg total) by mouth daily.   Levemir FlexTouch 100 UNIT/ML FlexPen Generic drug: insulin detemir Inject 35 Units into the skin at bedtime.   metoprolol succinate 100 MG 24 hr tablet Commonly known as: TOPROL-XL TAKE 1 AND 1/2 TABLETS BY MOUTH DAILY What changed:   how much to take  how to take this  when to take this  additional instructions   nitroGLYCERIN 0.4 MG SL tablet Commonly known as: NITROSTAT Place 0.4 mg under the tongue every 5 (five) minutes as needed for chest pain.    pantoprazole 40 MG tablet Commonly known as: PROTONIX Take 1 tablet (40 mg total) by mouth daily.   Ventolin HFA 108 (90 Base) MCG/ACT inhaler Generic drug: albuterol Inhale 1 puff into the lungs every 4 (four) hours as needed for shortness of breath.            Durable Medical Equipment  (From admission, onward)         Start     Ordered   05/07/19 1120  DME 3-in-1  Once     05/07/19 1121         Allergies  Allergen Reactions  . Doxycycline Other (See Comments)    Lethargy   . Hydrochlorothiazide Other (See Comments)    Lethargy   . Latex Rash  . Penicillins Swelling and Rash    Pt states she has tolerated Keflex in the past without problems. States she may have tolerated Augmentin in the past but it caused GI upset. Has patient had a PCN reaction causing immediate rash, facial/tongue/throat swelling, SOB or lightheadedness with hypotension: Yes Has patient had a PCN reaction causing severe rash involving mucus membranes or skin necrosis: No Has patient had a PCN reaction that required hospitalization No Has patient had a PCN reaction occurring within the last 10 years: No   Follow-up Information    [facility] Follow up.   Specialty: Home Health Services Why: Registered Nurse, Physical Therapy, Aide-office to call with visit times. If yo unned to call office please call [phone]           The results of significant diagnostics from this hospitalization (including imaging, microbiology, ancillary and laboratory) are listed below for reference.    Significant Diagnostic Studies: CT ABDOMEN PELVIS WO CONTRAST  Result Date: 05/04/2019 CLINICAL DATA:  Abdominal pain, nausea, vomiting and diarrhea x3 days. EXAM: CT ABDOMEN AND PELVIS WITHOUT CONTRAST TECHNIQUE: Multidetector CT imaging of the abdomen and pelvis was performed following the standard protocol without IV contrast. COMPARISON:  None. FINDINGS: Lower chest: Mild atelectasis  and/or infiltrate is seen within the posterior aspect of the right lung base. There is a small right pleural effusion. Hepatobiliary: No focal liver abnormality is seen. Tiny gallstones are seen within the lumen of an otherwise normal-appearing gallbladder. Pancreas: Unremarkable. No pancreatic ductal dilatation or surrounding inflammatory changes. Spleen: Normal in size without focal abnormality. Adrenals/Urinary Tract: Adrenal glands are unremarkable. Kidneys are normal, without renal calculi, focal lesion, or hydronephrosis. Bladder is unremarkable. Stomach/Bowel: Stomach is within normal  limits. The appendix is absent. No evidence of bowel dilatation. Mild diffuse thickening of the sigmoid colon is seen. Noninflamed diverticula are seen within the descending and proximal sigmoid colon. Vascular/Lymphatic: There is marked severity aortic calcification. Marked severity calcification of the mesenteric arteries is seen within the upper abdomen. A right common iliac artery stent is seen. No enlarged abdominal or pelvic lymph nodes. Reproductive: Status post hysterectomy. No adnexal masses. Other: No abdominal wall hernia or abnormality. No abdominopelvic ascites. Musculoskeletal: Multilevel degenerative changes seen throughout the lumbar spine. IMPRESSION: 1. Mild atelectasis and/or infiltrate within the posterior right lung base with associated small right pleural effusion. 2. Cholelithiasis without evidence for cholecystitis. 3. Noninflamed diverticula within the descending and proximal sigmoid colon. 4. Mild diffuse thickening of the sigmoid colon which may be, in part secondary to poor bowel distention. Mild colitis within this region cannot completely be excluded. Aortic Atherosclerosis (ICD10-I70.0). Electronically Signed   By: Aram Candela M.D.   On: 05/04/2019 03:23   DG Tibia/Fibula Left  Result Date: 04/24/2019 CLINICAL DATA:  Swelling and rash EXAM: LEFT TIBIA AND FIBULA - 2 VIEW COMPARISON:   None. FINDINGS: There is diffuse osteopenia. No fracture or dislocation. There is diffuse subcutaneous edema seen surrounding the lower extremity. No subcutaneous emphysema seen within the soft tissues. Overlying skin folds are seen within the medial aspect of the lower extremity. Surgical clips and dense vascular calcifications are noted. IMPRESSION: Diffuse subcutaneous edema, no definite emphysema within the soft tissues. Electronically Signed   By: Jonna Clark M.D.   On: 04/24/2019 21:02   DG Chest Port 1 View  Result Date: 04/24/2019 CLINICAL DATA:  Dyspnea. Bilateral leg edema. EXAM: PORTABLE CHEST 1 VIEW COMPARISON:  03/23/2019 FINDINGS: The heart size remains enlarged. Aortic calcifications are noted. There is vascular congestion with possible early pulmonary edema. There is a small right-sided pleural effusion. There is no focal infiltrate. There is probable atelectasis at the lung bases. IMPRESSION: Cardiomegaly with pulmonary vascular congestion and possible early pulmonary edema. Small right pleural effusion. Electronically Signed   By: Katherine Mantle M.D.   On: 04/24/2019 18:33   VAS [place] LOWER EXTREMITY VENOUS (DVT) (ONLY [facility] & [facility])  Result Date: 04/25/2019  Lower Venous DVTStudy Indications: Weeping, blister.  Limitations: Patient's inability to tolerate touch/compression and bandages. Comparison Study: No prior study Performing Technologist: Sherren Kerns RVS  Examination Guidelines: A complete evaluation includes B-mode imaging, spectral Doppler, color Doppler, and power Doppler as needed of all accessible portions of each vessel. Bilateral testing is considered an integral part of a complete examination. Limited examinations for reoccurring indications may be performed as noted. The reflux portion of the exam is performed with the patient in reverse Trendelenburg.  +-----+---------------+---------+-----------+----------+--------------+  RIGHTCompressibilityPhasicitySpontaneityPropertiesThrombus Aging +-----+---------------+---------+-----------+----------+--------------+ CFV  Full           Yes      Yes                  pulsatile      +-----+---------------+---------+-----------+----------+--------------+   +---------+---------------+---------+-----------+----------+-------------------+ LEFT     CompressibilityPhasicitySpontaneityPropertiesThrombus Aging      +---------+---------------+---------+-----------+----------+-------------------+ CFV      Full                                         pulsatile           +---------+---------------+---------+-----------+----------+-------------------+ SFJ      Full                                                             +---------+---------------+---------+-----------+----------+-------------------+  FV Prox                 Yes      Yes                  patent by color and                                                       Doppler             +---------+---------------+---------+-----------+----------+-------------------+ FV Mid                                                patent by color and                                                       Doppler             +---------+---------------+---------+-----------+----------+-------------------+ FV Distal                                             patent by color and                                                       Doppler             +---------+---------------+---------+-----------+----------+-------------------+ POP      Full           Yes      Yes                                      +---------+---------------+---------+-----------+----------+-------------------+ PTV                                                   Not visualized      +---------+---------------+---------+-----------+----------+-------------------+ PERO                                                   Not visualized      +---------+---------------+---------+-----------+----------+-------------------+     Summary: RIGHT: - No evidence of common femoral vein obstruction.  LEFT: - There is no evidence of deep vein thrombosis in the lower extremity. However, portions of this examination were limited- see technologist comments above.  *See table(s) above for measurements and observations. Electronically signed by Deitra Mayo MD on 04/25/2019 at 1:47:29 PM.    Final  Microbiology: Recent Results (from the past 240 hour(s))  Blood culture (routine x 2)     Status: None (Preliminary result)   Collection Time: 05/04/19  4:16 AM   Specimen: BLOOD  Result Value Ref Range Status   Specimen Description BLOOD LEFT ANTECUBITAL  Final   Special Requests   Final    BOTTLES DRAWN AEROBIC AND ANAEROBIC Blood Culture adequate volume   Culture   Final    NO GROWTH 3 DAYS Performed at [facility], [address] [place], [place] [address]    Report Status PENDING  Incomplete  Blood culture (routine x 2)     Status: Abnormal   Collection Time: 05/04/19  4:21 AM   Specimen: BLOOD  Result Value Ref Range Status   Specimen Description BLOOD RIGHT ANTECUBITAL  Final   Special Requests   Final    BOTTLES DRAWN AEROBIC AND ANAEROBIC Blood Culture adequate volume   Culture  Setup Time   Final    GRAM POSITIVE COCCI IN CLUSTERS AEROBIC BOTTLE ONLY CRITICAL RESULT CALLED TO, READ BACK BY AND VERIFIED WITH: Gay Filler PharmD 9:20 05/05/19 ([email or web address]) Performed at [facility], [address] [place], [place] [address]    Culture STAPHYLOCOCCUS EPIDERMIDIS (A)  Final   Report Status 05/07/2019 FINAL  Final   Organism ID, Bacteria STAPHYLOCOCCUS EPIDERMIDIS  Final      Susceptibility   Staphylococcus epidermidis - MIC*    CIPROFLOXACIN 4 RESISTANT Resistant     ERYTHROMYCIN >=8 RESISTANT Resistant     GENTAMICIN >=16 RESISTANT Resistant     OXACILLIN >=4 RESISTANT  Resistant     TETRACYCLINE <=1 SENSITIVE Sensitive     VANCOMYCIN 2 SENSITIVE Sensitive     TRIMETH/SULFA 80 RESISTANT Resistant     CLINDAMYCIN >=8 RESISTANT Resistant     RIFAMPIN <=0.5 SENSITIVE Sensitive     Inducible Clindamycin NEGATIVE Sensitive     * STAPHYLOCOCCUS EPIDERMIDIS  Blood Culture ID Panel (Reflexed)     Status: Abnormal   Collection Time: 05/04/19  4:21 AM  Result Value Ref Range Status   Enterococcus species NOT DETECTED NOT DETECTED Final   Listeria monocytogenes NOT DETECTED NOT DETECTED Final   Staphylococcus species DETECTED (A) NOT DETECTED Final    Comment: Methicillin (oxacillin) resistant coagulase negative staphylococcus. Possible blood culture contaminant (unless isolated from more than one blood culture draw or clinical case suggests pathogenicity). No antibiotic treatment is indicated for blood  culture contaminants. CRITICAL RESULT CALLED TO, READ BACK BY AND VERIFIED WITH: Gay Filler PharmD 9:20 05/05/19 ([email or web address])    Staphylococcus aureus (BCID) NOT DETECTED NOT DETECTED Final   Methicillin resistance DETECTED (A) NOT DETECTED Final    Comment: CRITICAL RESULT CALLED TO, READ BACK BY AND VERIFIED WITH: Gay Filler PharmD 9:20 05/05/19 ([email or web address])    Streptococcus species NOT DETECTED NOT DETECTED Final   Streptococcus agalactiae NOT DETECTED NOT DETECTED Final   Streptococcus pneumoniae NOT DETECTED NOT DETECTED Final   Streptococcus pyogenes NOT DETECTED NOT DETECTED Final   Acinetobacter baumannii NOT DETECTED NOT DETECTED Final   Enterobacteriaceae species NOT DETECTED NOT DETECTED Final   Enterobacter cloacae complex NOT DETECTED NOT DETECTED Final   Escherichia coli NOT DETECTED NOT DETECTED Final   Klebsiella oxytoca NOT DETECTED NOT DETECTED Final   Klebsiella pneumoniae NOT DETECTED NOT DETECTED Final   Proteus species NOT DETECTED NOT DETECTED Final   Serratia marcescens NOT DETECTED NOT DETECTED Final   Haemophilus influenzae NOT DETECTED NOT  DETECTED  Final   Neisseria meningitidis NOT DETECTED NOT DETECTED Final   Pseudomonas aeruginosa NOT DETECTED NOT DETECTED Final   Candida albicans NOT DETECTED NOT DETECTED Final   Candida glabrata NOT DETECTED NOT DETECTED Final   Candida krusei NOT DETECTED NOT DETECTED Final   Candida parapsilosis NOT DETECTED NOT DETECTED Final   Candida tropicalis NOT DETECTED NOT DETECTED Final    Comment: Performed at [facility], [address], [place], [place] [address]  SARS CORONAVIRUS 2 (TAT 6-24 HRS) Nasopharyngeal Nasopharyngeal Swab     Status: None   Collection Time: 05/04/19  5:14 AM   Specimen: Nasopharyngeal Swab  Result Value Ref Range Status   SARS Coronavirus 2 NEGATIVE NEGATIVE Final    Comment: (NOTE) SARS-CoV-2 target nucleic acids are NOT DETECTED. The SARS-CoV-2 RNA is generally detectable in upper and lower respiratory specimens during the acute phase of infection. Negative results do not preclude SARS-CoV-2 infection, do not rule out co-infections with other pathogens, and should not be used as the sole basis for treatment or other patient management decisions. Negative results must be combined with clinical observations, patient history, and epidemiological information. The expected result is Negative. Fact Sheet for Patients: HairSlick.no Fact Sheet for Healthcare Providers: quierodirigir.com This test is not yet approved or cleared by the [place] FDA and  has been authorized for detection and/or diagnosis of SARS-CoV-2 by FDA under an Emergency Use Authorization (EUA). This EUA will remain  in effect (meaning this test can be used) for the duration of the COVID-19 declaration under Section 56 4(b)(1) of the Act, 21 U.S.C. section 360bbb-3(b)(1), unless the authorization is terminated or revoked sooner. Performed at [facility], [address], [place], [place] [address]      Labs: Basic  Metabolic Panel: Recent Labs  Lab 05/03/19 2109 05/03/19 2235 05/04/19 0400 05/05/19 0315 05/06/19 0511  NA 139 134*  --  136 136  K 7.2* 7.3* 5.7* 5.4* 4.8  CL 110 106  --  107 108  CO2 18* 15*  --  18* 18*  GLUCOSE 137* 117*  --  92 84  BUN 70* 73*  --  64* 60*  CREATININE 2.69* 2.57*  --  2.44* 2.37*  CALCIUM 9.1 8.9  --  8.6* 8.2*  PHOS  --   --   --   --  4.3   Liver Function Tests: Recent Labs  Lab 05/03/19 2109 05/06/19 0511  AST 103*  --   ALT 70*  --   ALKPHOS 207*  --   BILITOT 1.2  --   PROT 7.2  --   ALBUMIN 3.4* 2.7*   Recent Labs  Lab 05/03/19 2109  LIPASE 58*   No results for input(s): AMMONIA in the last 168 hours. CBC: Recent Labs  Lab 05/03/19 2109 05/05/19 0315 05/06/19 0511  WBC 10.0 7.8 6.8  NEUTROABS  --   --  4.1  HGB 10.0* 8.5* 8.3*  HCT 33.4* 28.8* 27.5*  MCV 78.8* 77.2* 76.8*  PLT 411* 269 244   Cardiac Enzymes: No results for input(s): CKTOTAL, CKMB, CKMBINDEX, TROPONINI in the last 168 hours. BNP: BNP (last 3 results) Recent Labs    03/20/19 1157 03/23/19 0930 04/25/19 0233  BNP 2,161.2* 2,115.8* 3,075.3*    ProBNP (last 3 results) No results for input(s): PROBNP in the last 8760 hours.  CBG: Recent Labs  Lab 05/06/19 1130 05/06/19 1630 05/06/19 2101 05/07/19 0736 05/07/19 1108  GLUCAP 81 118* 222* 81 121*  Signed:  Rhetta Mura MD   [facility] 05/07/2019, 11:21 AM

## 2019-05-07 NOTE — TOC Transition Note (Signed)
Transition of Care [facility]) - CM/SW Discharge Note   Patient Details  Name: [name] [name] MRN: [id number] Date of Birth: 15-Jun-1950  Transition of Care [facility] CM/SW Contact:  Bess Kinds, RN Phone Number: [phone] 05/07/2019, 12:27 PM   Clinical Narrative:     Patient to transition home today. HH orders placed by MD. [facility] notified of discharge orders. Spoke with patient on the phone. She states that she already has a 3N1. Her daughter will pick her up to transport home. She verbalized understanding to follow up with PCP and to pick up her medications from the pharmacy. No further TOC needs identified at this time.    Final next level of care: Home w Home Health Services Barriers to Discharge: No Barriers Identified   Patient Goals and CMS Choice Patient states their goals for this hospitalization and ongoing recovery are:: to return home and get stronger. CMS Medicare.gov Compare Post Acute Care list provided to:: Patient Choice offered to / list presented to : Patient  Discharge Placement                       Discharge Plan and Services In-house Referral: NA Discharge Planning Services: CM Consult Post Acute Care Choice: Home Health          DME Arranged: 3-N-1(already has one) DME Agency: NA       HH Arranged: RN, PT, Nurse's Aide HH Agency: [facility] Date HH Agency Contacted: 05/07/19 Time HH Agency Contacted: 1227 Representative spoke with at [facility] Agency: Lupita Leash  Social Determinants of Health (SDOH) Interventions     Readmission Risk Interventions Readmission Risk Prevention Plan 05/06/2019  Transportation Screening Complete  Medication Review [employer]) Complete  HRI or Home Care Consult Complete  SW Recovery Care/Counseling Consult Complete  Palliative Care Screening Not Applicable  Skilled Nursing Facility Complete  Some recent data might be hidden

## 2019-05-07 NOTE — Care Management Important Message (Signed)
Important Message  Patient Details  Name: [name] [name] MRN: [id number] Date of Birth: May 19, 1950   Medicare Important Message Given:  Yes     Renie Ora 05/07/2019, 8:24 AM

## 2019-05-09 DIAGNOSIS — E875 Hyperkalemia: Secondary | ICD-10-CM | POA: Diagnosis not present

## 2019-05-09 DIAGNOSIS — E1122 Type 2 diabetes mellitus with diabetic chronic kidney disease: Secondary | ICD-10-CM | POA: Diagnosis not present

## 2019-05-09 DIAGNOSIS — N179 Acute kidney failure, unspecified: Secondary | ICD-10-CM | POA: Diagnosis not present

## 2019-05-09 DIAGNOSIS — Z6830 Body mass index (BMI) 30.0-30.9, adult: Secondary | ICD-10-CM | POA: Diagnosis not present

## 2019-05-09 DIAGNOSIS — I252 Old myocardial infarction: Secondary | ICD-10-CM | POA: Diagnosis not present

## 2019-05-09 DIAGNOSIS — I130 Hypertensive heart and chronic kidney disease with heart failure and stage 1 through stage 4 chronic kidney disease, or unspecified chronic kidney disease: Secondary | ICD-10-CM | POA: Diagnosis not present

## 2019-05-09 DIAGNOSIS — Z7901 Long term (current) use of anticoagulants: Secondary | ICD-10-CM | POA: Diagnosis not present

## 2019-05-09 DIAGNOSIS — I5042 Chronic combined systolic (congestive) and diastolic (congestive) heart failure: Secondary | ICD-10-CM | POA: Diagnosis not present

## 2019-05-09 DIAGNOSIS — L03116 Cellulitis of left lower limb: Secondary | ICD-10-CM | POA: Diagnosis not present

## 2019-05-09 DIAGNOSIS — I872 Venous insufficiency (chronic) (peripheral): Secondary | ICD-10-CM | POA: Diagnosis not present

## 2019-05-09 DIAGNOSIS — E1151 Type 2 diabetes mellitus with diabetic peripheral angiopathy without gangrene: Secondary | ICD-10-CM | POA: Diagnosis not present

## 2019-05-09 DIAGNOSIS — Z794 Long term (current) use of insulin: Secondary | ICD-10-CM | POA: Diagnosis not present

## 2019-05-09 DIAGNOSIS — I2510 Atherosclerotic heart disease of native coronary artery without angina pectoris: Secondary | ICD-10-CM | POA: Diagnosis not present

## 2019-05-09 DIAGNOSIS — I70203 Unspecified atherosclerosis of native arteries of extremities, bilateral legs: Secondary | ICD-10-CM | POA: Diagnosis not present

## 2019-05-09 DIAGNOSIS — Z4800 Encounter for change or removal of nonsurgical wound dressing: Secondary | ICD-10-CM | POA: Diagnosis not present

## 2019-05-09 DIAGNOSIS — Z87891 Personal history of nicotine dependence: Secondary | ICD-10-CM | POA: Diagnosis not present

## 2019-05-09 DIAGNOSIS — I482 Chronic atrial fibrillation, unspecified: Secondary | ICD-10-CM | POA: Diagnosis not present

## 2019-05-09 DIAGNOSIS — Z8673 Personal history of transient ischemic attack (TIA), and cerebral infarction without residual deficits: Secondary | ICD-10-CM | POA: Diagnosis not present

## 2019-05-09 DIAGNOSIS — Z7902 Long term (current) use of antithrombotics/antiplatelets: Secondary | ICD-10-CM | POA: Diagnosis not present

## 2019-05-09 DIAGNOSIS — E1140 Type 2 diabetes mellitus with diabetic neuropathy, unspecified: Secondary | ICD-10-CM | POA: Diagnosis not present

## 2019-05-09 DIAGNOSIS — D631 Anemia in chronic kidney disease: Secondary | ICD-10-CM | POA: Diagnosis not present

## 2019-05-09 DIAGNOSIS — G43909 Migraine, unspecified, not intractable, without status migrainosus: Secondary | ICD-10-CM | POA: Diagnosis not present

## 2019-05-09 DIAGNOSIS — N1832 Chronic kidney disease, stage 3b: Secondary | ICD-10-CM | POA: Diagnosis not present

## 2019-05-09 DIAGNOSIS — E1165 Type 2 diabetes mellitus with hyperglycemia: Secondary | ICD-10-CM | POA: Diagnosis not present

## 2019-05-09 LAB — CULTURE, BLOOD (ROUTINE X 2)
Culture: NO GROWTH
Special Requests: ADEQUATE

## 2019-05-10 DIAGNOSIS — I70203 Unspecified atherosclerosis of native arteries of extremities, bilateral legs: Secondary | ICD-10-CM | POA: Diagnosis not present

## 2019-05-10 DIAGNOSIS — Z4800 Encounter for change or removal of nonsurgical wound dressing: Secondary | ICD-10-CM | POA: Diagnosis not present

## 2019-05-10 DIAGNOSIS — L03116 Cellulitis of left lower limb: Secondary | ICD-10-CM | POA: Diagnosis not present

## 2019-05-10 DIAGNOSIS — L0390 Cellulitis, unspecified: Secondary | ICD-10-CM | POA: Diagnosis not present

## 2019-05-10 DIAGNOSIS — E1151 Type 2 diabetes mellitus with diabetic peripheral angiopathy without gangrene: Secondary | ICD-10-CM | POA: Diagnosis not present

## 2019-05-10 DIAGNOSIS — E1165 Type 2 diabetes mellitus with hyperglycemia: Secondary | ICD-10-CM | POA: Diagnosis not present

## 2019-05-10 DIAGNOSIS — I872 Venous insufficiency (chronic) (peripheral): Secondary | ICD-10-CM | POA: Diagnosis not present

## 2019-05-11 DIAGNOSIS — I509 Heart failure, unspecified: Secondary | ICD-10-CM | POA: Diagnosis not present

## 2019-05-11 DIAGNOSIS — E782 Mixed hyperlipidemia: Secondary | ICD-10-CM | POA: Diagnosis not present

## 2019-05-11 DIAGNOSIS — I10 Essential (primary) hypertension: Secondary | ICD-10-CM | POA: Diagnosis not present

## 2019-05-11 DIAGNOSIS — E1159 Type 2 diabetes mellitus with other circulatory complications: Secondary | ICD-10-CM | POA: Diagnosis not present

## 2019-05-11 DIAGNOSIS — I4891 Unspecified atrial fibrillation: Secondary | ICD-10-CM | POA: Diagnosis not present

## 2019-05-11 DIAGNOSIS — I2510 Atherosclerotic heart disease of native coronary artery without angina pectoris: Secondary | ICD-10-CM | POA: Diagnosis not present

## 2019-05-11 DIAGNOSIS — N1831 Chronic kidney disease, stage 3a: Secondary | ICD-10-CM | POA: Diagnosis not present

## 2019-05-11 DIAGNOSIS — J449 Chronic obstructive pulmonary disease, unspecified: Secondary | ICD-10-CM | POA: Diagnosis not present

## 2019-05-11 DIAGNOSIS — N183 Chronic kidney disease, stage 3 unspecified: Secondary | ICD-10-CM | POA: Diagnosis not present

## 2019-05-11 DIAGNOSIS — I252 Old myocardial infarction: Secondary | ICD-10-CM | POA: Diagnosis not present

## 2019-05-11 DIAGNOSIS — E1140 Type 2 diabetes mellitus with diabetic neuropathy, unspecified: Secondary | ICD-10-CM | POA: Diagnosis not present

## 2019-05-12 DIAGNOSIS — E1165 Type 2 diabetes mellitus with hyperglycemia: Secondary | ICD-10-CM | POA: Diagnosis not present

## 2019-05-12 DIAGNOSIS — I70203 Unspecified atherosclerosis of native arteries of extremities, bilateral legs: Secondary | ICD-10-CM | POA: Diagnosis not present

## 2019-05-12 DIAGNOSIS — I872 Venous insufficiency (chronic) (peripheral): Secondary | ICD-10-CM | POA: Diagnosis not present

## 2019-05-12 DIAGNOSIS — Z4800 Encounter for change or removal of nonsurgical wound dressing: Secondary | ICD-10-CM | POA: Diagnosis not present

## 2019-05-12 DIAGNOSIS — L03116 Cellulitis of left lower limb: Secondary | ICD-10-CM | POA: Diagnosis not present

## 2019-05-12 DIAGNOSIS — E1151 Type 2 diabetes mellitus with diabetic peripheral angiopathy without gangrene: Secondary | ICD-10-CM | POA: Diagnosis not present

## 2019-05-14 ENCOUNTER — Encounter: Payer: Medicare Other | Admitting: Physician Assistant

## 2019-05-14 DIAGNOSIS — E1151 Type 2 diabetes mellitus with diabetic peripheral angiopathy without gangrene: Secondary | ICD-10-CM | POA: Diagnosis not present

## 2019-05-14 DIAGNOSIS — E1165 Type 2 diabetes mellitus with hyperglycemia: Secondary | ICD-10-CM | POA: Diagnosis not present

## 2019-05-14 DIAGNOSIS — L03116 Cellulitis of left lower limb: Secondary | ICD-10-CM | POA: Diagnosis not present

## 2019-05-14 DIAGNOSIS — I872 Venous insufficiency (chronic) (peripheral): Secondary | ICD-10-CM | POA: Diagnosis not present

## 2019-05-14 DIAGNOSIS — I70203 Unspecified atherosclerosis of native arteries of extremities, bilateral legs: Secondary | ICD-10-CM | POA: Diagnosis not present

## 2019-05-14 DIAGNOSIS — Z4800 Encounter for change or removal of nonsurgical wound dressing: Secondary | ICD-10-CM | POA: Diagnosis not present

## 2019-05-15 NOTE — Progress Notes (Signed)
This encounter was created in error - please disregard.

## 2019-05-16 DIAGNOSIS — I872 Venous insufficiency (chronic) (peripheral): Secondary | ICD-10-CM | POA: Diagnosis not present

## 2019-05-16 DIAGNOSIS — I70203 Unspecified atherosclerosis of native arteries of extremities, bilateral legs: Secondary | ICD-10-CM | POA: Diagnosis not present

## 2019-05-16 DIAGNOSIS — E1165 Type 2 diabetes mellitus with hyperglycemia: Secondary | ICD-10-CM | POA: Diagnosis not present

## 2019-05-16 DIAGNOSIS — Z4800 Encounter for change or removal of nonsurgical wound dressing: Secondary | ICD-10-CM | POA: Diagnosis not present

## 2019-05-16 DIAGNOSIS — E1151 Type 2 diabetes mellitus with diabetic peripheral angiopathy without gangrene: Secondary | ICD-10-CM | POA: Diagnosis not present

## 2019-05-16 DIAGNOSIS — L03116 Cellulitis of left lower limb: Secondary | ICD-10-CM | POA: Diagnosis not present

## 2019-05-17 DIAGNOSIS — Z4800 Encounter for change or removal of nonsurgical wound dressing: Secondary | ICD-10-CM | POA: Diagnosis not present

## 2019-05-17 DIAGNOSIS — L03116 Cellulitis of left lower limb: Secondary | ICD-10-CM | POA: Diagnosis not present

## 2019-05-17 DIAGNOSIS — E1165 Type 2 diabetes mellitus with hyperglycemia: Secondary | ICD-10-CM | POA: Diagnosis not present

## 2019-05-17 DIAGNOSIS — E1151 Type 2 diabetes mellitus with diabetic peripheral angiopathy without gangrene: Secondary | ICD-10-CM | POA: Diagnosis not present

## 2019-05-17 DIAGNOSIS — I70203 Unspecified atherosclerosis of native arteries of extremities, bilateral legs: Secondary | ICD-10-CM | POA: Diagnosis not present

## 2019-05-17 DIAGNOSIS — I872 Venous insufficiency (chronic) (peripheral): Secondary | ICD-10-CM | POA: Diagnosis not present

## 2019-05-18 DIAGNOSIS — I70203 Unspecified atherosclerosis of native arteries of extremities, bilateral legs: Secondary | ICD-10-CM | POA: Diagnosis not present

## 2019-05-18 DIAGNOSIS — E1151 Type 2 diabetes mellitus with diabetic peripheral angiopathy without gangrene: Secondary | ICD-10-CM | POA: Diagnosis not present

## 2019-05-18 DIAGNOSIS — I872 Venous insufficiency (chronic) (peripheral): Secondary | ICD-10-CM | POA: Diagnosis not present

## 2019-05-18 DIAGNOSIS — L03116 Cellulitis of left lower limb: Secondary | ICD-10-CM | POA: Diagnosis not present

## 2019-05-18 DIAGNOSIS — E1165 Type 2 diabetes mellitus with hyperglycemia: Secondary | ICD-10-CM | POA: Diagnosis not present

## 2019-05-18 DIAGNOSIS — Z4800 Encounter for change or removal of nonsurgical wound dressing: Secondary | ICD-10-CM | POA: Diagnosis not present

## 2019-05-20 DIAGNOSIS — L0390 Cellulitis, unspecified: Secondary | ICD-10-CM | POA: Diagnosis not present

## 2019-05-20 DIAGNOSIS — I2510 Atherosclerotic heart disease of native coronary artery without angina pectoris: Secondary | ICD-10-CM | POA: Diagnosis not present

## 2019-05-20 DIAGNOSIS — I509 Heart failure, unspecified: Secondary | ICD-10-CM | POA: Diagnosis not present

## 2019-05-20 DIAGNOSIS — N1831 Chronic kidney disease, stage 3a: Secondary | ICD-10-CM | POA: Diagnosis not present

## 2019-05-20 DIAGNOSIS — R600 Localized edema: Secondary | ICD-10-CM | POA: Diagnosis not present

## 2019-05-20 DIAGNOSIS — I872 Venous insufficiency (chronic) (peripheral): Secondary | ICD-10-CM | POA: Diagnosis not present

## 2019-05-21 DIAGNOSIS — I872 Venous insufficiency (chronic) (peripheral): Secondary | ICD-10-CM | POA: Diagnosis not present

## 2019-05-21 DIAGNOSIS — E1165 Type 2 diabetes mellitus with hyperglycemia: Secondary | ICD-10-CM | POA: Diagnosis not present

## 2019-05-21 DIAGNOSIS — I70203 Unspecified atherosclerosis of native arteries of extremities, bilateral legs: Secondary | ICD-10-CM | POA: Diagnosis not present

## 2019-05-21 DIAGNOSIS — E1151 Type 2 diabetes mellitus with diabetic peripheral angiopathy without gangrene: Secondary | ICD-10-CM | POA: Diagnosis not present

## 2019-05-21 DIAGNOSIS — Z4800 Encounter for change or removal of nonsurgical wound dressing: Secondary | ICD-10-CM | POA: Diagnosis not present

## 2019-05-21 DIAGNOSIS — L03116 Cellulitis of left lower limb: Secondary | ICD-10-CM | POA: Diagnosis not present

## 2019-05-24 DIAGNOSIS — I872 Venous insufficiency (chronic) (peripheral): Secondary | ICD-10-CM | POA: Diagnosis not present

## 2019-05-24 DIAGNOSIS — E1151 Type 2 diabetes mellitus with diabetic peripheral angiopathy without gangrene: Secondary | ICD-10-CM | POA: Diagnosis not present

## 2019-05-24 DIAGNOSIS — L03116 Cellulitis of left lower limb: Secondary | ICD-10-CM | POA: Diagnosis not present

## 2019-05-24 DIAGNOSIS — Z4800 Encounter for change or removal of nonsurgical wound dressing: Secondary | ICD-10-CM | POA: Diagnosis not present

## 2019-05-24 DIAGNOSIS — I70203 Unspecified atherosclerosis of native arteries of extremities, bilateral legs: Secondary | ICD-10-CM | POA: Diagnosis not present

## 2019-05-24 DIAGNOSIS — E1165 Type 2 diabetes mellitus with hyperglycemia: Secondary | ICD-10-CM | POA: Diagnosis not present

## 2019-05-25 DIAGNOSIS — E1151 Type 2 diabetes mellitus with diabetic peripheral angiopathy without gangrene: Secondary | ICD-10-CM | POA: Diagnosis not present

## 2019-05-25 DIAGNOSIS — I872 Venous insufficiency (chronic) (peripheral): Secondary | ICD-10-CM | POA: Diagnosis not present

## 2019-05-25 DIAGNOSIS — Z4800 Encounter for change or removal of nonsurgical wound dressing: Secondary | ICD-10-CM | POA: Diagnosis not present

## 2019-05-25 DIAGNOSIS — L03116 Cellulitis of left lower limb: Secondary | ICD-10-CM | POA: Diagnosis not present

## 2019-05-25 DIAGNOSIS — I70203 Unspecified atherosclerosis of native arteries of extremities, bilateral legs: Secondary | ICD-10-CM | POA: Diagnosis not present

## 2019-05-25 DIAGNOSIS — E1165 Type 2 diabetes mellitus with hyperglycemia: Secondary | ICD-10-CM | POA: Diagnosis not present

## 2019-05-27 DIAGNOSIS — I70203 Unspecified atherosclerosis of native arteries of extremities, bilateral legs: Secondary | ICD-10-CM | POA: Diagnosis not present

## 2019-05-27 DIAGNOSIS — E1151 Type 2 diabetes mellitus with diabetic peripheral angiopathy without gangrene: Secondary | ICD-10-CM | POA: Diagnosis not present

## 2019-05-27 DIAGNOSIS — L03116 Cellulitis of left lower limb: Secondary | ICD-10-CM | POA: Diagnosis not present

## 2019-05-27 DIAGNOSIS — Z4800 Encounter for change or removal of nonsurgical wound dressing: Secondary | ICD-10-CM | POA: Diagnosis not present

## 2019-05-27 DIAGNOSIS — I872 Venous insufficiency (chronic) (peripheral): Secondary | ICD-10-CM | POA: Diagnosis not present

## 2019-05-27 DIAGNOSIS — E1165 Type 2 diabetes mellitus with hyperglycemia: Secondary | ICD-10-CM | POA: Diagnosis not present

## 2019-05-29 DIAGNOSIS — I70203 Unspecified atherosclerosis of native arteries of extremities, bilateral legs: Secondary | ICD-10-CM | POA: Diagnosis not present

## 2019-05-29 DIAGNOSIS — I872 Venous insufficiency (chronic) (peripheral): Secondary | ICD-10-CM | POA: Diagnosis not present

## 2019-05-29 DIAGNOSIS — L03116 Cellulitis of left lower limb: Secondary | ICD-10-CM | POA: Diagnosis not present

## 2019-05-29 DIAGNOSIS — E1165 Type 2 diabetes mellitus with hyperglycemia: Secondary | ICD-10-CM | POA: Diagnosis not present

## 2019-05-29 DIAGNOSIS — Z4800 Encounter for change or removal of nonsurgical wound dressing: Secondary | ICD-10-CM | POA: Diagnosis not present

## 2019-05-29 DIAGNOSIS — E1151 Type 2 diabetes mellitus with diabetic peripheral angiopathy without gangrene: Secondary | ICD-10-CM | POA: Diagnosis not present

## 2019-05-31 ENCOUNTER — Encounter (HOSPITAL_COMMUNITY): Payer: Self-pay | Admitting: Family Medicine

## 2019-05-31 ENCOUNTER — Inpatient Hospital Stay (HOSPITAL_COMMUNITY)
Admission: EM | Admit: 2019-05-31 | Discharge: 2019-06-05 | DRG: 602 | Disposition: A | Discharge status: Skilled Nursing Facility | Payer: Medicare Other | Source: Ambulatory Visit | Attending: Internal Medicine | Admitting: Internal Medicine

## 2019-05-31 ENCOUNTER — Other Ambulatory Visit: Payer: Self-pay

## 2019-05-31 DIAGNOSIS — N189 Chronic kidney disease, unspecified: Secondary | ICD-10-CM | POA: Diagnosis present

## 2019-05-31 DIAGNOSIS — I252 Old myocardial infarction: Secondary | ICD-10-CM

## 2019-05-31 DIAGNOSIS — L089 Local infection of the skin and subcutaneous tissue, unspecified: Secondary | ICD-10-CM | POA: Diagnosis present

## 2019-05-31 DIAGNOSIS — I4821 Permanent atrial fibrillation: Secondary | ICD-10-CM | POA: Diagnosis present

## 2019-05-31 DIAGNOSIS — Z8673 Personal history of transient ischemic attack (TIA), and cerebral infarction without residual deficits: Secondary | ICD-10-CM

## 2019-05-31 DIAGNOSIS — E875 Hyperkalemia: Secondary | ICD-10-CM

## 2019-05-31 DIAGNOSIS — E1142 Type 2 diabetes mellitus with diabetic polyneuropathy: Secondary | ICD-10-CM | POA: Diagnosis present

## 2019-05-31 DIAGNOSIS — Z7401 Bed confinement status: Secondary | ICD-10-CM | POA: Diagnosis not present

## 2019-05-31 DIAGNOSIS — Z89421 Acquired absence of other right toe(s): Secondary | ICD-10-CM

## 2019-05-31 DIAGNOSIS — I5022 Chronic systolic (congestive) heart failure: Secondary | ICD-10-CM

## 2019-05-31 DIAGNOSIS — N179 Acute kidney failure, unspecified: Secondary | ICD-10-CM

## 2019-05-31 DIAGNOSIS — L0390 Cellulitis, unspecified: Secondary | ICD-10-CM | POA: Diagnosis present

## 2019-05-31 DIAGNOSIS — Z9861 Coronary angioplasty status: Secondary | ICD-10-CM

## 2019-05-31 DIAGNOSIS — R5381 Other malaise: Secondary | ICD-10-CM | POA: Diagnosis present

## 2019-05-31 DIAGNOSIS — I872 Venous insufficiency (chronic) (peripheral): Secondary | ICD-10-CM

## 2019-05-31 DIAGNOSIS — I10 Essential (primary) hypertension: Secondary | ICD-10-CM | POA: Diagnosis not present

## 2019-05-31 DIAGNOSIS — Z95828 Presence of other vascular implants and grafts: Secondary | ICD-10-CM

## 2019-05-31 DIAGNOSIS — L280 Lichen simplex chronicus: Secondary | ICD-10-CM | POA: Diagnosis present

## 2019-05-31 DIAGNOSIS — T148XXA Other injury of unspecified body region, initial encounter: Secondary | ICD-10-CM | POA: Diagnosis present

## 2019-05-31 DIAGNOSIS — N1832 Chronic kidney disease, stage 3b: Secondary | ICD-10-CM | POA: Diagnosis present

## 2019-05-31 DIAGNOSIS — I878 Other specified disorders of veins: Secondary | ICD-10-CM | POA: Diagnosis present

## 2019-05-31 DIAGNOSIS — I482 Chronic atrial fibrillation, unspecified: Secondary | ICD-10-CM | POA: Diagnosis present

## 2019-05-31 DIAGNOSIS — E119 Type 2 diabetes mellitus without complications: Secondary | ICD-10-CM | POA: Diagnosis not present

## 2019-05-31 DIAGNOSIS — M6281 Muscle weakness (generalized): Secondary | ICD-10-CM | POA: Diagnosis not present

## 2019-05-31 DIAGNOSIS — K828 Other specified diseases of gallbladder: Secondary | ICD-10-CM | POA: Diagnosis present

## 2019-05-31 DIAGNOSIS — K219 Gastro-esophageal reflux disease without esophagitis: Secondary | ICD-10-CM | POA: Diagnosis present

## 2019-05-31 DIAGNOSIS — Z881 Allergy status to other antibiotic agents status: Secondary | ICD-10-CM

## 2019-05-31 DIAGNOSIS — K8020 Calculus of gallbladder without cholecystitis without obstruction: Secondary | ICD-10-CM | POA: Diagnosis present

## 2019-05-31 DIAGNOSIS — L8995 Pressure ulcer of unspecified site, unstageable: Secondary | ICD-10-CM | POA: Diagnosis not present

## 2019-05-31 DIAGNOSIS — E860 Dehydration: Secondary | ICD-10-CM | POA: Diagnosis present

## 2019-05-31 DIAGNOSIS — R7989 Other specified abnormal findings of blood chemistry: Secondary | ICD-10-CM | POA: Diagnosis present

## 2019-05-31 DIAGNOSIS — I130 Hypertensive heart and chronic kidney disease with heart failure and stage 1 through stage 4 chronic kidney disease, or unspecified chronic kidney disease: Secondary | ICD-10-CM | POA: Diagnosis present

## 2019-05-31 DIAGNOSIS — I739 Peripheral vascular disease, unspecified: Secondary | ICD-10-CM | POA: Diagnosis present

## 2019-05-31 DIAGNOSIS — Z794 Long term (current) use of insulin: Secondary | ICD-10-CM

## 2019-05-31 DIAGNOSIS — R21 Rash and other nonspecific skin eruption: Secondary | ICD-10-CM | POA: Diagnosis not present

## 2019-05-31 DIAGNOSIS — I890 Lymphedema, not elsewhere classified: Secondary | ICD-10-CM | POA: Diagnosis not present

## 2019-05-31 DIAGNOSIS — Z87891 Personal history of nicotine dependence: Secondary | ICD-10-CM

## 2019-05-31 DIAGNOSIS — Z20822 Contact with and (suspected) exposure to covid-19: Secondary | ICD-10-CM | POA: Diagnosis present

## 2019-05-31 DIAGNOSIS — Z7902 Long term (current) use of antithrombotics/antiplatelets: Secondary | ICD-10-CM

## 2019-05-31 DIAGNOSIS — L308 Other specified dermatitis: Secondary | ICD-10-CM | POA: Diagnosis not present

## 2019-05-31 DIAGNOSIS — Z888 Allergy status to other drugs, medicaments and biological substances status: Secondary | ICD-10-CM

## 2019-05-31 DIAGNOSIS — Z7901 Long term (current) use of anticoagulants: Secondary | ICD-10-CM

## 2019-05-31 DIAGNOSIS — Z91040 Latex allergy status: Secondary | ICD-10-CM

## 2019-05-31 DIAGNOSIS — M2550 Pain in unspecified joint: Secondary | ICD-10-CM | POA: Diagnosis not present

## 2019-05-31 DIAGNOSIS — I5043 Acute on chronic combined systolic (congestive) and diastolic (congestive) heart failure: Secondary | ICD-10-CM | POA: Diagnosis present

## 2019-05-31 DIAGNOSIS — Z95820 Peripheral vascular angioplasty status with implants and grafts: Secondary | ICD-10-CM

## 2019-05-31 DIAGNOSIS — I87022 Postthrombotic syndrome with inflammation of left lower extremity: Secondary | ICD-10-CM | POA: Diagnosis not present

## 2019-05-31 DIAGNOSIS — Z823 Family history of stroke: Secondary | ICD-10-CM

## 2019-05-31 DIAGNOSIS — Z880 Allergy status to penicillin: Secondary | ICD-10-CM

## 2019-05-31 DIAGNOSIS — E1151 Type 2 diabetes mellitus with diabetic peripheral angiopathy without gangrene: Secondary | ICD-10-CM | POA: Diagnosis present

## 2019-05-31 DIAGNOSIS — L03119 Cellulitis of unspecified part of limb: Secondary | ICD-10-CM | POA: Diagnosis not present

## 2019-05-31 DIAGNOSIS — J449 Chronic obstructive pulmonary disease, unspecified: Secondary | ICD-10-CM | POA: Diagnosis present

## 2019-05-31 DIAGNOSIS — Z89411 Acquired absence of right great toe: Secondary | ICD-10-CM

## 2019-05-31 DIAGNOSIS — Z79899 Other long term (current) drug therapy: Secondary | ICD-10-CM

## 2019-05-31 DIAGNOSIS — I2584 Coronary atherosclerosis due to calcified coronary lesion: Secondary | ICD-10-CM | POA: Diagnosis not present

## 2019-05-31 DIAGNOSIS — M7989 Other specified soft tissue disorders: Secondary | ICD-10-CM | POA: Diagnosis not present

## 2019-05-31 DIAGNOSIS — E785 Hyperlipidemia, unspecified: Secondary | ICD-10-CM | POA: Diagnosis present

## 2019-05-31 DIAGNOSIS — L03116 Cellulitis of left lower limb: Principal | ICD-10-CM | POA: Diagnosis present

## 2019-05-31 DIAGNOSIS — E1122 Type 2 diabetes mellitus with diabetic chronic kidney disease: Secondary | ICD-10-CM | POA: Diagnosis present

## 2019-05-31 DIAGNOSIS — Z90710 Acquired absence of both cervix and uterus: Secondary | ICD-10-CM

## 2019-05-31 DIAGNOSIS — R112 Nausea with vomiting, unspecified: Secondary | ICD-10-CM

## 2019-05-31 DIAGNOSIS — IMO0002 Reserved for concepts with insufficient information to code with codable children: Secondary | ICD-10-CM

## 2019-05-31 DIAGNOSIS — E1165 Type 2 diabetes mellitus with hyperglycemia: Secondary | ICD-10-CM | POA: Diagnosis not present

## 2019-05-31 DIAGNOSIS — I2510 Atherosclerotic heart disease of native coronary artery without angina pectoris: Secondary | ICD-10-CM | POA: Diagnosis present

## 2019-05-31 DIAGNOSIS — Z8249 Family history of ischemic heart disease and other diseases of the circulatory system: Secondary | ICD-10-CM

## 2019-05-31 DIAGNOSIS — Z833 Family history of diabetes mellitus: Secondary | ICD-10-CM

## 2019-05-31 DIAGNOSIS — R110 Nausea: Secondary | ICD-10-CM | POA: Diagnosis not present

## 2019-05-31 LAB — COMPREHENSIVE METABOLIC PANEL
ALT: 20 U/L (ref 0–44)
AST: 32 U/L (ref 15–41)
Albumin: 2.9 g/dL — ABNORMAL LOW (ref 3.5–5.0)
Alkaline Phosphatase: 121 U/L (ref 38–126)
Anion gap: 13 (ref 5–15)
BUN: 82 mg/dL — ABNORMAL HIGH (ref 8–23)
CO2: 18 mmol/L — ABNORMAL LOW (ref 22–32)
Calcium: 8.9 mg/dL (ref 8.9–10.3)
Chloride: 102 mmol/L (ref 98–111)
Creatinine, Ser: 4.02 mg/dL — ABNORMAL HIGH (ref 0.44–1.00)
GFR calc Af Amer: 12 mL/min — ABNORMAL LOW (ref >60)
GFR calc non Af Amer: 11 mL/min — ABNORMAL LOW (ref >60)
Glucose, Bld: 148 mg/dL — ABNORMAL HIGH (ref 70–99)
Potassium: 6.4 mmol/L (ref 3.5–5.1)
Sodium: 133 mmol/L — ABNORMAL LOW (ref 135–145)
Total Bilirubin: 1.2 mg/dL (ref 0.3–1.2)
Total Protein: 6.6 g/dL (ref 6.5–8.1)

## 2019-05-31 LAB — LACTIC ACID, PLASMA
Lactic Acid, Venous: 2.6 mmol/L (ref 0.5–1.9)
Lactic Acid, Venous: 1.9 mmol/L (ref 0.5–1.9)

## 2019-05-31 LAB — CBC WITH DIFFERENTIAL/PLATELET
Abs Immature Granulocytes: 0.04 10*3/uL (ref 0.00–0.07)
Basophils Absolute: 0 10*3/uL (ref 0.0–0.1)
Basophils Relative: 0 %
Eosinophils Absolute: 0.2 10*3/uL (ref 0.0–0.5)
Eosinophils Relative: 2 %
HCT: 30.9 % — ABNORMAL LOW (ref 36.0–46.0)
Hemoglobin: 9.2 g/dL — ABNORMAL LOW (ref 12.0–15.0)
Immature Granulocytes: 0 %
Lymphocytes Relative: 15 %
Lymphs Abs: 1.5 10*3/uL (ref 0.7–4.0)
MCH: 22.5 pg — ABNORMAL LOW (ref 26.0–34.0)
MCHC: 29.8 g/dL — ABNORMAL LOW (ref 30.0–36.0)
MCV: 75.6 fL — ABNORMAL LOW (ref 80.0–100.0)
Monocytes Absolute: 0.9 10*3/uL (ref 0.1–1.0)
Monocytes Relative: 9 %
Neutro Abs: 7.3 10*3/uL (ref 1.7–7.7)
Neutrophils Relative %: 74 %
Platelets: 404 10*3/uL — ABNORMAL HIGH (ref 150–400)
RBC: 4.09 MIL/uL (ref 3.87–5.11)
RDW: 19.9 % — ABNORMAL HIGH (ref 11.5–15.5)
WBC: 9.9 10*3/uL (ref 4.0–10.5)
nRBC: 2.1 % — ABNORMAL HIGH (ref 0.0–0.2)

## 2019-05-31 LAB — RESPIRATORY PANEL BY RT PCR (FLU A&B, COVID)
Influenza A by PCR: NEGATIVE
Influenza B by PCR: NEGATIVE
SARS Coronavirus 2 by RT PCR: NEGATIVE

## 2019-05-31 LAB — MAGNESIUM: Magnesium: 2.1 mg/dL (ref 1.7–2.4)

## 2019-05-31 LAB — CBG MONITORING, ED: Glucose-Capillary: 89 mg/dL (ref 70–99)

## 2019-05-31 MED ORDER — SODIUM CHLORIDE 0.9 % IV SOLN
2.0000 g | Freq: Once | INTRAVENOUS | Status: AC
  Administered 2019-05-31: 2 g via INTRAVENOUS
  Filled 2019-05-31: qty 2

## 2019-05-31 MED ORDER — PANTOPRAZOLE SODIUM 40 MG PO TBEC
40.0000 mg | DELAYED_RELEASE_TABLET | Freq: Every day | ORAL | Status: DC
  Administered 2019-05-31 – 2019-06-02 (×3): 40 mg via ORAL
  Filled 2019-05-31 (×3): qty 1

## 2019-05-31 MED ORDER — VANCOMYCIN HCL 1750 MG/350ML IV SOLN
1750.0000 mg | Freq: Once | INTRAVENOUS | Status: AC
  Administered 2019-05-31: 1750 mg via INTRAVENOUS
  Filled 2019-05-31: qty 350

## 2019-05-31 MED ORDER — MORPHINE SULFATE (PF) 2 MG/ML IV SOLN
2.0000 mg | Freq: Once | INTRAVENOUS | Status: AC
  Administered 2019-05-31: 2 mg via INTRAVENOUS
  Filled 2019-05-31: qty 1

## 2019-05-31 MED ORDER — ALBUTEROL SULFATE HFA 108 (90 BASE) MCG/ACT IN AERS
2.0000 | INHALATION_SPRAY | Freq: Four times a day (QID) | RESPIRATORY_TRACT | Status: DC | PRN
  Filled 2019-05-31: qty 6.7

## 2019-05-31 MED ORDER — CALCIUM GLUCONATE-NACL 1-0.675 GM/50ML-% IV SOLN
1.0000 g | Freq: Once | INTRAVENOUS | Status: AC
  Administered 2019-05-31: 1000 mg via INTRAVENOUS
  Filled 2019-05-31: qty 50

## 2019-05-31 MED ORDER — VANCOMYCIN VARIABLE DOSE PER UNSTABLE RENAL FUNCTION (PHARMACIST DOSING): Status: DC

## 2019-05-31 MED ORDER — SODIUM CHLORIDE 0.9 % IV SOLN: 1.0000 g | Freq: Once | INTRAVENOUS | Status: DC

## 2019-05-31 MED ORDER — ISOSORBIDE MONONITRATE ER 60 MG PO TB24
90.0000 mg | ORAL_TABLET | Freq: Every day | ORAL | Status: DC
  Administered 2019-05-31 – 2019-06-05 (×6): 90 mg via ORAL
  Filled 2019-05-31 (×5): qty 1
  Filled 2019-05-31: qty 3

## 2019-05-31 MED ORDER — SODIUM CHLORIDE 0.9 % IV SOLN
2.0000 g | INTRAVENOUS | Status: DC
  Administered 2019-06-01 – 2019-06-04 (×4): 2 g via INTRAVENOUS
  Filled 2019-05-31 (×5): qty 2

## 2019-05-31 MED ORDER — ALBUTEROL SULFATE HFA 108 (90 BASE) MCG/ACT IN AERS
8.0000 | INHALATION_SPRAY | Freq: Once | RESPIRATORY_TRACT | Status: AC
  Administered 2019-05-31: 8 via RESPIRATORY_TRACT
  Filled 2019-05-31: qty 6.7

## 2019-05-31 MED ORDER — TRAMADOL HCL 50 MG PO TABS
50.0000 mg | ORAL_TABLET | Freq: Two times a day (BID) | ORAL | Status: DC | PRN
  Administered 2019-06-01 – 2019-06-05 (×4): 50 mg via ORAL
  Filled 2019-05-31 (×4): qty 1

## 2019-05-31 MED ORDER — METOPROLOL SUCCINATE ER 100 MG PO TB24: 100.0000 mg | ORAL_TABLET | ORAL | Status: DC

## 2019-05-31 MED ORDER — ONDANSETRON HCL 4 MG/2ML IJ SOLN
4.0000 mg | Freq: Once | INTRAMUSCULAR | Status: AC
  Administered 2019-05-31: 4 mg via INTRAVENOUS
  Filled 2019-05-31: qty 2

## 2019-05-31 MED ORDER — SODIUM POLYSTYRENE SULFONATE 15 GM/60ML PO SUSP
15.0000 g | Freq: Once | ORAL | Status: AC
  Administered 2019-05-31: 15 g via ORAL
  Filled 2019-05-31: qty 60

## 2019-05-31 MED ORDER — CLOPIDOGREL BISULFATE 75 MG PO TABS
75.0000 mg | ORAL_TABLET | Freq: Every day | ORAL | Status: DC
  Administered 2019-06-01 – 2019-06-03 (×3): 75 mg via ORAL
  Filled 2019-05-31 (×3): qty 1

## 2019-05-31 MED ORDER — SODIUM CHLORIDE 0.9 % IV SOLN: INTRAVENOUS | Status: DC

## 2019-05-31 MED ORDER — ATORVASTATIN CALCIUM 80 MG PO TABS
80.0000 mg | ORAL_TABLET | Freq: Every day | ORAL | Status: DC
  Administered 2019-05-31 – 2019-06-04 (×5): 80 mg via ORAL
  Filled 2019-05-31 (×5): qty 1

## 2019-05-31 MED ORDER — INSULIN ASPART 100 UNIT/ML ~~LOC~~ SOLN: 0.0000 [IU] | Freq: Every day | SUBCUTANEOUS | Status: DC

## 2019-05-31 MED ORDER — APIXABAN 5 MG PO TABS
5.0000 mg | ORAL_TABLET | Freq: Two times a day (BID) | ORAL | Status: DC
  Administered 2019-05-31: 5 mg via ORAL
  Filled 2019-05-31 (×2): qty 1

## 2019-05-31 MED ORDER — INSULIN ASPART 100 UNIT/ML IV SOLN
5.0000 [IU] | Freq: Once | INTRAVENOUS | Status: AC
  Administered 2019-05-31: 5 [IU] via INTRAVENOUS

## 2019-05-31 MED ORDER — MORPHINE SULFATE (PF) 2 MG/ML IV SOLN
2.0000 mg | Freq: Three times a day (TID) | INTRAVENOUS | Status: DC | PRN
  Administered 2019-06-01: 2 mg via INTRAVENOUS
  Filled 2019-05-31: qty 1

## 2019-05-31 MED ORDER — INSULIN ASPART 100 UNIT/ML ~~LOC~~ SOLN
0.0000 [IU] | Freq: Three times a day (TID) | SUBCUTANEOUS | Status: DC
  Administered 2019-06-01 – 2019-06-02 (×3): 1 [IU] via SUBCUTANEOUS
  Administered 2019-06-02 (×2): 2 [IU] via SUBCUTANEOUS
  Administered 2019-06-03 (×2): 1 [IU] via SUBCUTANEOUS
  Administered 2019-06-04: 3 [IU] via SUBCUTANEOUS
  Administered 2019-06-04: 2 [IU] via SUBCUTANEOUS
  Administered 2019-06-05: 1 [IU] via SUBCUTANEOUS

## 2019-05-31 MED ORDER — DEXTROSE 50 % IV SOLN
1.0000 | Freq: Once | INTRAVENOUS | Status: AC
  Administered 2019-05-31: 50 mL via INTRAVENOUS
  Filled 2019-05-31: qty 50

## 2019-05-31 MED ORDER — ACETAMINOPHEN 325 MG PO TABS: 650.0000 mg | ORAL_TABLET | Freq: Four times a day (QID) | ORAL | Status: DC | PRN

## 2019-05-31 NOTE — ED Triage Notes (Signed)
PT had a tele visit with [facility] today. Pt has had wounds to lower ext. For 1 month. Pt has one wound to posterior Lt calf and bil. Feet.. Pt reports she needs IV anti-Bx.

## 2019-05-31 NOTE — Progress Notes (Signed)
Pharmacy Antibiotic Note  [name] [name] is a 69 y.o. female admitted on 05/31/2019 with Wound infection.  Pharmacy has been consulted for Cefepime and Vancomycin dosing.   Height: 5\' 3"  (160 cm) Weight: 83.9 kg (185 lb) IBW/kg (Calculated) : 52.4  Temp (24hrs), Avg:97.6 F (36.4 C), Min:97.6 F (36.4 C), Max:97.6 F (36.4 C)  Recent Labs  Lab 05/31/19 1743 05/31/19 1744  WBC 9.9  --   CREATININE 4.02*  --   LATICACIDVEN  --  2.6*    Estimated Creatinine Clearance: 13.6 mL/min (A) (by C-G formula based on SCr of 4.02 mg/dL (H)).    Allergies  Allergen Reactions  . Bactrim [Sulfamethoxazole-Trimethoprim] Nausea And Vomiting  . Doxycycline Other (See Comments)    Lethargy   . Hydrochlorothiazide Other (See Comments)    Lethargy   . Latex Rash  . Penicillins Swelling and Rash    Pt states she has tolerated Keflex in the past without problems. States she may have tolerated Augmentin in the past but it caused GI upset. Has patient had a PCN reaction causing immediate rash, facial/tongue/throat swelling, SOB or lightheadedness with hypotension: Yes Has patient had a PCN reaction causing severe rash involving mucus membranes or skin necrosis: No Has patient had a PCN reaction that required hospitalization No Has patient had a PCN reaction occurring within the last 10 years: No    Antimicrobials this admission: 5/3 Cefepime >>  5/3 Vancomycin >>   Dose adjustments this admission: N/a  Microbiology results: Pending   Plan:  - Cefepime 2g IV q24h - Vancomycin 1750mg  IV x 1 dose  - Will dose Vancomycin by random levels 2/2 acute on chronic kidney injury  - Monitor patients renal function and urine output for improvement and adjust abx as needed  - De-escalate ABX when appropriate   Thank you for allowing pharmacy to be a part of this patient's care.  7/3 PharmD. BCPS 05/31/2019 7:32 PM

## 2019-05-31 NOTE — ED Provider Notes (Signed)
[facility] EMERGENCY DEPARTMENT Provider Note   CSN: [id number] Arrival date & time: 05/31/19  1657     History Chief Complaint  Patient presents with  . Wound Check    [name] [name] is a 69 y.o. female.  69 yo F with a chief complaints of worsening wounds to the left leg.  Patient has had chronic venous insufficiency wounds on that leg and its been going on for about a month there even longer.  She has been keeping it in a [employer] and then is changed to a different dressing.  She has been on different antibiotics but has had difficulty tolerating them at home.  Most recently was on Bactrim and stopped about 5 days ago did nausea and vomiting.  She denies fevers.  Is at the area is gotten much larger.  She called her family doctor and had a televisit today and they evaluated her leg and felt she had come to the ED for IV antibiotics and admission.  The history is provided by the patient.  Wound Check Pertinent negatives include no chest pain, no headaches and no shortness of breath.  Illness Severity:  Moderate Onset quality:  Gradual Duration:  2 months Timing:  Constant Progression:  Worsening Chronicity:  New Associated symptoms: fatigue   Associated symptoms: no chest pain, no congestion, no fever, no headaches, no myalgias, no nausea, no rhinorrhea, no shortness of breath, no vomiting and no wheezing        Past Medical History:  Diagnosis Date  . AKI (acute kidney injury) (HCC) 05/04/2019  . Anemia 10/2015   Acute Blood Loss  . Arthritis    "feel like I have it all over" (08/28/2015)  . Cellulitis and abscess of foot 04/26/2019   left lower extremity  . CHF (congestive heart failure) (HCC)   . Complication of anesthesia    DIFFICULT WAKING "only when I was smoking; no problems since I quit"  . Coronary artery disease   . Family history of adverse reaction to anesthesia    sister slow to wake up  . GERD (gastroesophageal reflux disease)    takes Protonix daily   . Hip bursitis   . History of blood transfusion    10/2015  . Hyperlipidemia LDL goal < 70 06/28/2013   takes Atorvastatin daily  . Hypertension    takes Metoprolol and Imdur daily  . Hypoxia 01/2018  . Malnutrition (HCC)   . Migraine    "none in a long time" (08/28/2015)  . Myocardial infarction (HCC) 2011  . Neuromuscular disorder (HCC)    DIABETIC NEUROPATHY  . Osteomyelitis (HCC) 2017   Left foot  . PAD (peripheral artery disease) (HCC)   . Peripheral vascular disease (HCC)   . Respiratory failure (HCC) 10/2015   Acute Hypoxia- acute pulmonary edema 11/13/2015  . Septic shock (HCC) 10/2015  . Stroke (HCC)   . Type II diabetes mellitus (HCC)    takes Lantus nightly.Average fasting blood sugar runs 80-90  Type II    Patient Active Problem List   Diagnosis Date Noted  . Acute kidney injury superimposed on CKD (HCC) 05/04/2019  . Nausea vomiting and diarrhea 05/04/2019  . CHF exacerbation (HCC) 04/24/2019  . Chronic a-fib (HCC) 04/24/2019  . Cellulitis 04/24/2019  . Vertigo 03/25/2019  . Atrial fibrillation with RVR (HCC) 03/20/2019  . Hyperkalemia   . Dyspnea 03/11/2019  . Elevated troponin 03/11/2019  . Chest pain 03/11/2019  . Acute on chronic combined systolic  and diastolic CHF (congestive heart failure) (HCC) 05/08/2018  . Renal insufficiency 02/13/2018  . Stage 3b chronic kidney disease 02/13/2018  . Acute systolic CHF (congestive heart failure) (HCC) 02/13/2018  . Benign hypertension 11/26/2017  . Gangrene of right foot (HCC) 11/01/2016  . GERD (gastroesophageal reflux disease) 11/01/2016  . Gangrene of left foot (HCC) 10/17/2016  . Sepsis (HCC) 10/17/2016  . Midfoot ulcer, left, limited to breakdown of skin (HCC) 01/08/2016  . Thigh hematoma 12/22/2015  . History of blood transfusion 12/22/2015  . Acute on chronic systolic CHF (congestive heart failure) (HCC) 12/22/2015  . H/O skin graft 12/11/2015  . SEMI (subendocardial myocardial  infarction) (HCC) 11/19/2015  . DM type 2 with diabetic peripheral neuropathy (HCC) 11/19/2015  . Traumatic hemorrhagic shock (HCC)   . Chronic systolic CHF (congestive heart failure) (HCC)   . Uncontrolled type 2 diabetes mellitus with complication (HCC)   . Demand myocardial infarction (HCC) 11/08/2015  . Surgery, elective   . Central line infection, initial encounter   . Septic shock (HCC) 11/03/2015  . Encephalopathy acute 11/03/2015  . Acute blood loss as cause of postoperative anemia 11/03/2015  . Acute respiratory failure with hypoxia (HCC)   . CVA (cerebral vascular accident) (HCC)   . S/P femoral-popliteal bypass surgery   . Malnutrition of moderate degree 11/01/2015  . Diabetic ulcer of left midfoot associated with diabetes mellitus due to underlying condition, with muscle involvement without evidence of necrosis (HCC)   . Hypertensive heart disease with chronic diastolic congestive heart failure (HCC) 10/31/2015  . Osteomyelitis of left foot (HCC) 10/30/2015  . PVD (peripheral vascular disease) with claudication (HCC) 09/28/2015  . Critical lower limb ischemia 08/28/2015  . Hyperlipidemia with target LDL less than 70 06/28/2013  . Diabetes mellitus type II, uncontrolled (HCC) 03/03/2013  . Complicated migraine 03/03/2013  . Stroke-like symptom 03/02/2013  . Diabetes mellitus (HCC) 03/02/2013  . Aphasia 03/02/2013  . Headache(784.0) 03/02/2013  . PAD (peripheral artery disease) (HCC) 06/05/2011  . Coronary artery disease involving native coronary artery of native heart without angina pectoris 06/05/2011  . Hypotension 06/05/2011  . Claudication in peripheral vascular disease:  Lifestyle limiting. 06/05/2011  . Hx of tobacco use, presenting hazards to health 06/05/2011    Past Surgical History:  Procedure Laterality Date  . ABDOMINAL HYSTERECTOMY    . AMPUTATION Right 10/22/2016   Procedure: AMPUTATION DIGIT RIGHT TOES 1-3 POSSIBLE TRANSMETATARSAL;  Surgeon: Chuck Hint, MD;  Location: [facility] OR;  Service: Vascular;  Laterality: Right;  . APPENDECTOMY    . ATHERECTOMY N/A 06/04/2011   Procedure: ATHERECTOMY;  Surgeon: Runell Gess, MD;  Location: [facility] CATH LAB;  Service: Cardiovascular;  Laterality: N/A;  . CARDIAC CATHETERIZATION  10/13/2009   95% stenosis in the AV groove circumflex and 95% ostial stenosis in small OM3. A 3x93mm drug-eluting Promus stent inserted ito the circumflex. Dilatated with a 3.25x34mm noncompliant Quantum balloon within entire segment. The entire region was reduced to 0% and brisk TIMI3 flow.  . CAROTID DUPLEX  03/19/2011   Right ICA-demonstrates complete occlusion. Left ICA-demonstrates a small amount of fibrous plaque.  Marland Kitchen CATARACT EXTRACTION W/ INTRAOCULAR LENS IMPLANT Right   . CESAREAN SECTION  1990  . CORONARY ANGIOPLASTY    . ENDARTERECTOMY FEMORAL Left 09/05/2015   Procedure: ENDARTERECTOMY FEMORAL WITH PROFUNDOPLASTY;  Surgeon: Chuck Hint, MD;  Location: [facility] OR;  Service: Vascular;  Laterality: Left;  Left common femoral artery vein patch using left saphenous vien  . ENDARTERECTOMY FEMORAL Right  10/22/2016   Procedure: ENDARTERECTOMY RIGHT COMMON FEMORAL;  Surgeon: Angelia Mould, MD;  Location: [place];  Service: Vascular;  Laterality: Right;  . FEMORAL-POPLITEAL BYPASS GRAFT Left 11/02/2015   Procedure: BYPASS GRAFT FEMORAL-POPLITEAL ARTERY VS FEMORAL-TIBIAL ARTERY BYPASS;  Surgeon: Angelia Mould, MD;  Location: [place];  Service: Vascular;  Laterality: Left;  . FEMORAL-POPLITEAL BYPASS GRAFT Right 10/22/2016   Procedure: BYPASS GRAFT RIGHT FEMORAL- BELOW KNEE POPLITEAL ARTERY WITH VEIN;  Surgeon: Angelia Mould, MD;  Location: [place];  Service: Vascular;  Laterality: Right;  . I & D EXTREMITY Left 11/10/2015   Procedure: Debridement Left Foot Ulcer, Application  Wound VAC;  Surgeon: Newt Minion, MD;  Location: [place];  Service: Orthopedics;  Laterality: Left;  . ILIAC ARTERY STENT Left  08/28/2015   common  . INTRAOPERATIVE ARTERIOGRAM Left 09/05/2015   Procedure: INTRA OPERATIVE ARTERIOGRAM;  Surgeon: Angelia Mould, MD;  Location: [place];  Service: Vascular;  Laterality: Left;  . INTRAOPERATIVE ARTERIOGRAM Left 11/02/2015   Procedure: INTRA OPERATIVE ARTERIOGRAM;  Surgeon: Angelia Mould, MD;  Location: [place];  Service: Vascular;  Laterality: Left;  . INTRAOPERATIVE ARTERIOGRAM Right 10/22/2016   Procedure: INTRA OPERATIVE ARTERIOGRAM;  Surgeon: Angelia Mould, MD;  Location: [place];  Service: Vascular;  Laterality: Right;  . LEFT HEART CATH AND CORONARY ANGIOGRAPHY N/A 05/11/2018   Procedure: LEFT HEART CATH AND CORONARY ANGIOGRAPHY;  Surgeon: Troy Sine, MD;  Location: [place] CV LAB;  Service: Cardiovascular;  Laterality: N/A;  Carlton Adam MYOVIEW  10/25/2010   Moderate perfusion defect due to infarct/scar with mild perinfarct ischemia seen in the Basal Inferolateral, Basal Anterolateral, Mid Inferolateral, and Mid Anterolateral regions. Post-stress EF is 50%.  . LOWER EXTREMITY ANGIOGRAPHY N/A 10/17/2016   Procedure: Lower Extremity Angiography;  Surgeon: Lorretta Harp, MD;  Location: [place] CV LAB;  Service: Cardiovascular;  Laterality: N/A;  . OVARY SURGERY  1983?   "ruptured"  . PERIPHERAL VASCULAR ANGIOGRAM  01/26/2010   High-grade SFA disease: left greater than right. Left SFA would require fem-pop bypass grafting. Right SFA could be stented but might require Diamondback Orbital atherectomy.  Marland Kitchen PERIPHERAL VASCULAR ANGIOGRAM  02/23/2010   Stealth Predator orbital rotational atherectomy performed on SFA & Popliteal up to 90,000 RPM. Stenting using overlapping 5x146mm and 5x49mm Absolute Pro Nitinol self-expanding stents beginning just at the knee up to the mid SFA resulting in reduction of 90-95% calcified SFA & Popliteal stenosis to 0. Stenting performed on the distal common & proximal iliac artery with a 10x4 Absolute Pro- 70-0%.  Marland Kitchen  PERIPHERAL VASCULAR ANGIOGRAM  06/17/2010   PTA performed to the right external iliac artery stent using a 5x100 balloon at 10 atmospheres. Stenting performed using a 6x18 Genesis on Opta balloon. Postdilatation with a 7x2 balloon resulting in a 95% "in-stent" stenosis to 0% residual.  . PERIPHERAL VASCULAR ANGIOGRAM  06/04/2011   Bilateral total SFAs not percutaneously addressable. Good canidate for femoropopliteal bypass grafting  . PERIPHERAL VASCULAR ANGIOGRAM  08/28/2015  . PERIPHERAL VASCULAR CATHETERIZATION N/A 08/28/2015   Procedure: Lower Extremity Angiography;  Surgeon: Lorretta Harp, MD;  Location: [employer] CV LAB;  Service: Cardiovascular;  Laterality: N/A;  . PERIPHERAL VASCULAR CATHETERIZATION N/A 08/28/2015   Procedure: Abdominal Aortogram;  Surgeon: Lorretta Harp, MD;  Location: [place] CV LAB;  Service: Cardiovascular;  Laterality: N/A;  . PERIPHERAL VASCULAR CATHETERIZATION Left 08/28/2015   Procedure: Peripheral Vascular Intervention;  Surgeon: Lorretta Harp, MD;  Location: [facility] INVASIVE CV LAB;  Service: Cardiovascular;  Laterality: Left;  common iliac  . PERIPHERAL VASCULAR CATHETERIZATION Left 08/28/2015   Procedure: Peripheral Vascular Atherectomy;  Surgeon: Runell Gess, MD;  Location: [facility] INVASIVE CV LAB;  Service: Cardiovascular;  Laterality: Left;  common iliac  . PERIPHERAL VASCULAR CATHETERIZATION N/A 09/28/2015   Procedure: Lower Extremity Angiography;  Surgeon: Runell Gess, MD;  Location: [facility] INVASIVE CV LAB;  Service: Cardiovascular;  Laterality: N/A;  . PERIPHERAL VASCULAR CATHETERIZATION Left 09/28/2015   Procedure: Peripheral Vascular Intervention;  Surgeon: Runell Gess, MD;  Location: [facility] INVASIVE CV LAB;  Service: Cardiovascular;  Laterality: Left CFA  PCI with 9 mm x 4 cm Abbott nitinol absolute Pro self-expanding stent     . SKIN SPLIT GRAFT Left 12/01/2015   Procedure: LEFT FOOT SKIN GRAFT AND VAC;  Surgeon: [name], MD;  Location: [facility] OR;   Service: Orthopedics;  Laterality: Left;  . THROMBECTOMY FEMORAL ARTERY Right 10/22/2016   Procedure: THROMBECTOMY FEMORAL ARTERY;  Surgeon: Chuck Hint, MD;  Location: [facility] OR;  Service: Vascular;  Laterality: Right;  . TRANSTHORACIC ECHOCARDIOGRAM  10/17/2009   EF 45-50%, moderate hypokinesis of the entire inferolateral myocardium, mild concentric hypertrophy and mild regurg of the mitral valva.  Marland Kitchen VEIN HARVEST Left 11/02/2015   Procedure: LEFT GREATER SAPHENOUS VEIN HARVEST;  Surgeon: Chuck Hint, MD;  Location: [facility] OR;  Service: Vascular;  Laterality: Left;     OB History   No obstetric history on file.     Family History  Problem Relation Age of Onset  . Hypertension Mother   . Heart failure Mother   . Heart failure Father   . Stroke Father   . Diabetes Father     Social History   Tobacco Use  . Smoking status: Former Smoker    Packs/day: 1.50    Years: 41.00    Pack years: 61.50    Types: Cigarettes    Quit date: 10/12/2009    Years since quitting: 9.6  . Smokeless tobacco: Never Used  Substance Use Topics  . Alcohol use: No  . Drug use: No    Home Medications Prior to Admission medications   Medication Sig Start Date End Date Taking? Authorizing Provider  acetaminophen (TYLENOL) 500 MG tablet Take 1,000-1,500 mg by mouth every 6 (six) hours as needed for headache (pain).   Yes [provider]  albuterol (VENTOLIN HFA) 108 (90 Base) MCG/ACT inhaler Inhale 2 puffs into the lungs every 6 (six) hours as needed for wheezing or shortness of breath.   Yes [provider]  apixaban (ELIQUIS) 5 MG TABS tablet Take 1 tablet (5 mg total) by mouth 2 (two) times daily. 03/25/19  Yes Alessandra Bevels, MD  atorvastatin (LIPITOR) 80 MG tablet Take 80 mg by mouth at bedtime.    Yes [provider]  clopidogrel (PLAVIX) 75 MG tablet Take 75 mg by mouth daily.    Yes [provider]  furosemide (LASIX) 40 MG tablet Take 40 mg by  mouth 2 (two) times daily.   Yes [provider]  glipiZIDE (GLUCOTROL) 5 MG tablet Take 0.5 tablets (2.5 mg total) by mouth daily before breakfast. 05/07/19  Yes Samtani, Jai-Gurmukh, MD  insulin detemir (LEVEMIR FLEXTOUCH) 100 UNIT/ML FlexPen Inject 35 Units into the skin at bedtime.   Yes [provider]  isosorbide mononitrate (IMDUR) 60 MG 24 hr tablet Take 90 mg by mouth daily. 05/20/19  Yes [provider]  metoprolol succinate (TOPROL-XL) 100 MG 24 hr tablet TAKE 1 AND 1/2 TABLETS BY MOUTH DAILY Patient taking differently: Take 100 mg by mouth See admin instructions. Take one tablet (100 mg) by mouth with a 50 mg tablet for a total dose of 150 mg every evening 03/25/19  Yes Kamineni, Sallye Ober, MD  metoprolol succinate (TOPROL-XL) 50 MG 24 hr tablet Take 50 mg by mouth See admin instructions. Take one tablet (50 mg) by mouth with a 100 mg tablet for a total dose of 150 mg every evening 05/20/19  Yes [provider]  nitroGLYCERIN (NITROSTAT) 0.4 MG SL tablet Place 0.4 mg under the tongue every 5 (five) minutes as needed for chest pain.  05/15/18  Yes [provider]  pantoprazole (PROTONIX) 40 MG tablet Take 1 tablet (40 mg total) by mouth daily. 12/21/18  Yes Lennette Bihari, MD  traMADol (ULTRAM) 50 MG tablet Take 50 mg by mouth 3 (three) times daily as needed (pain).  05/19/19  Yes [provider]  furosemide (LASIX) 20 MG tablet Take 2 tablets (40 mg total) by mouth daily. Patient not taking: Reported on 05/31/2019 05/07/19   Rhetta Mura, MD  isosorbide mononitrate (IMDUR) 30 MG 24 hr tablet Take 3 tablets (90 mg total) by mouth daily. Patient not taking: Reported on 05/31/2019 03/25/19   Alessandra Bevels, MD    Allergies    Bactrim [sulfamethoxazole-trimethoprim], Doxycycline, Hydrochlorothiazide, Latex, and Penicillins  Review of Systems   Review of Systems  Constitutional: Positive for fatigue. Negative for chills and fever.  HENT:  Negative for congestion and rhinorrhea.   Eyes: Negative for redness and visual disturbance.  Respiratory: Negative for shortness of breath and wheezing.   Cardiovascular: Negative for chest pain and palpitations.  Gastrointestinal: Negative for nausea and vomiting.  Genitourinary: Negative for dysuria and urgency.  Musculoskeletal: Negative for arthralgias and myalgias.  Skin: Positive for wound. Negative for pallor.  Neurological: Negative for dizziness and headaches.    Physical Exam Updated Vital Signs BP (!) 123/57 (BP Location: Right Arm)   Pulse (!) 59   Temp 97.6 F (36.4 C)   Resp 19   Ht  (1.6 m)   Wt 83.9 kg   SpO2 95%   BMI 32.77 kg/m   Physical Exam Vitals and nursing note reviewed.  Constitutional:      General: She is not in acute distress.    Appearance: She is well-developed. She is not diaphoretic.  HENT:     Head: Normocephalic and atraumatic.  Eyes:     Pupils: Pupils are equal, round, and reactive to light.  Cardiovascular:     Rate and Rhythm: Normal rate and regular rhythm.     Heart sounds: No murmur. No friction rub. No gallop.   Pulmonary:     Effort: Pulmonary effort is normal.     Breath sounds: No wheezing or rales.  Abdominal:     General: There is no distension.     Palpations: Abdomen is soft.     Tenderness: There is no abdominal tenderness.  Musculoskeletal:        General: No tenderness.     Cervical back: Normal range of motion and neck supple.     Right lower leg: Edema (3+ to the thighs) present.     Left lower leg: Edema present.     Comments: Large area of ulceration to the left lower extremity starting just below the knee and extending down to the shin.  Also along the  dorsum of the foot.  Area is pink and not warm.  Similar to the other side as far as temperature.  Skin:    General: Skin is warm and dry.  Neurological:     Mental Status: She is alert and oriented to person, place, and time.  Psychiatric:         Behavior: Behavior normal.     ED Results / Procedures / Treatments   Labs (all labs ordered are listed, but only abnormal results are displayed) Labs Reviewed  CBC WITH DIFFERENTIAL/PLATELET - Abnormal; Notable for the following components:      Result Value   Hemoglobin 9.2 (*)    HCT 30.9 (*)    MCV 75.6 (*)    MCH 22.5 (*)    MCHC 29.8 (*)    RDW 19.9 (*)    Platelets 404 (*)    nRBC 2.1 (*)    All other components within normal limits  COMPREHENSIVE METABOLIC PANEL - Abnormal; Notable for the following components:   Sodium 133 (*)    Potassium 6.4 (*)    CO2 18 (*)    Glucose, Bld 148 (*)    BUN 82 (*)    Creatinine, Ser 4.02 (*)    Albumin 2.9 (*)    GFR calc non Af Amer 11 (*)    GFR calc Af Amer 12 (*)    All other components within normal limits  LACTIC ACID, PLASMA - Abnormal; Notable for the following components:   Lactic Acid, Venous 2.6 (*)    All other components within normal limits  CULTURE, BLOOD (ROUTINE X 2)  CULTURE, BLOOD (ROUTINE X 2)  RESPIRATORY PANEL BY RT PCR (FLU A&B, COVID)  LACTIC ACID, PLASMA    EKG EKG Interpretation  Date/Time:  Monday May 31 2019 18:40:43 EDT Ventricular Rate:  59 PR Interval:    QRS Duration: 126 QT Interval:  479 QTC Calculation: 475 R Axis:   130 Text Interpretation: Normal sinus rhythm Nonspecific intraventricular conduction delay Abnormal T, consider ischemia, lateral leads No significant change since last tracing Confirmed by Melene Plan [phone]) on 05/31/2019 6:42:46 PM   Radiology No results found.  Procedures Procedures (including critical care time)  Medications Ordered in ED Medications  vancomycin (VANCOREADY) IVPB 1750 mg/350 mL (1,750 mg Intravenous New Bag/Given 05/31/19 1831)  albuterol (VENTOLIN HFA) 108 (90 Base) MCG/ACT inhaler 8 puff (has no administration in time range)  insulin aspart (novoLOG) injection 5 Units (has no administration in time range)    And  dextrose 50 % solution 50 mL  (has no administration in time range)  calcium gluconate 1 g/ 50 mL sodium chloride IVPB (has no administration in time range)  ceFEPIme (MAXIPIME) 2 g in sodium chloride 0.9 % 100 mL IVPB ( Intravenous Stopped 05/31/19 1822)  morphine 2 MG/ML injection 2 mg (2 mg Intravenous Given 05/31/19 1754)  ondansetron (ZOFRAN) injection 4 mg (4 mg Intravenous Given 05/31/19 1752)    ED Course  I have reviewed the triage vital signs and the nursing notes.  Pertinent labs & imaging results that were available during my care of the patient were reviewed by me and considered in my medical decision making (see chart for details).    MDM Rules/Calculators/A&P                      69 yo F with a chief complaint of a wound to her left lower extremity.  The patient unfortunately has chronic  venous insufficiency as well as lower extremity edema thought to be secondary to heart failure or renal insufficiency.  The patient has had worsening of her lower extremity wound and was sent by her doctor for admission today with IV antibiotics.  Started on broad-spectrum antibiotics.  Patient has a significant renal dysfunction from her baseline as well as hyperkalemia.  Patient is bradycardic, will temporize.  Hemoglobin is slightly above baseline.  Lactate is mildly elevated at 2.6.  Will discuss with hospitalist.  CRITICAL CARE Performed by: Rae Roam   Total critical care time: 35 minutes  Critical care time was exclusive of separately billable procedures and treating other patients.  Critical care was necessary to treat or prevent imminent or life-threatening deterioration.  Critical care was time spent personally by me on the following activities: development of treatment plan with patient and/or surrogate as well as nursing, discussions with consultants, evaluation of patient's response to treatment, examination of patient, obtaining history from patient or surrogate, ordering and performing treatments and  interventions, ordering and review of laboratory studies, ordering and review of radiographic studies, pulse oximetry and re-evaluation of patient's condition.  The patients results and plan were reviewed and discussed.   Any x-rays performed were independently reviewed by myself.   Differential diagnosis were considered with the presenting HPI.  Medications  vancomycin (VANCOREADY) IVPB 1750 mg/350 mL (1,750 mg Intravenous New Bag/Given 05/31/19 1831)  albuterol (VENTOLIN HFA) 108 (90 Base) MCG/ACT inhaler 8 puff (has no administration in time range)  insulin aspart (novoLOG) injection 5 Units (has no administration in time range)    And  dextrose 50 % solution 50 mL (has no administration in time range)  calcium gluconate 1 g/ 50 mL sodium chloride IVPB (has no administration in time range)  ceFEPIme (MAXIPIME) 2 g in sodium chloride 0.9 % 100 mL IVPB ( Intravenous Stopped 05/31/19 1822)  morphine 2 MG/ML injection 2 mg (2 mg Intravenous Given 05/31/19 1754)  ondansetron (ZOFRAN) injection 4 mg (4 mg Intravenous Given 05/31/19 1752)    Vitals:   05/31/19 1703 05/31/19 1740 05/31/19 1740  BP:  (!) 123/57 (!) 123/57  Pulse:  61 (!) 59  Resp:  18 19  Temp:  97.6 F (36.4 C)   SpO2:  100% 95%  Weight: 83.9 kg    Height: 5\' 3"  (1.6 m)      Final diagnoses:  Venous stasis dermatitis of left lower extremity  Hyperkalemia  AKI (acute kidney injury) (HCC)    Admission/ observation were discussed with the admitting physician, patient and/or family and they are comfortable with the plan.    Final Clinical Impression(s) / ED Diagnoses Final diagnoses:  Venous stasis dermatitis of left lower extremity  Hyperkalemia  AKI (acute kidney injury) [facility])    Rx / DC Orders ED Discharge Orders    None       [facility], DO 05/31/19 1900

## 2019-05-31 NOTE — H&P (Signed)
[facility] History and Physical  [name] [name] OZD:[id number] DOB: 08-17-1950 DOA: 05/31/2019  Referring EDP: Adela Lank PCP: Georgann Housekeeper, MD   Chief Complaint: Wound infection  HPI: [name] [name] is a 69 y.o. female with PMH of DM; HTN; stage 3a CKD; CAD; PVD; chronic combined CHF; CVA; HLD; afib on Eliquis; and COPD presenting for left leg wound infection and admitted for cellulitis, hyperkalemia and AKI on CKD.  Patient reports worsening pain and redness of LLE for past 3 days. She has chronic venous insufficiency and has been using an Radio broadcast assistant and changing dressings as well as used several different antibiotics. She stopped Bactrim about 3-5 days ago because a of nausea and vomiting. Reports that she feels her swelling in her feet is overall better than normal, but that her left leg is extremely painful to a point that it makes her nauseous and she has never seen it this red. She called her PCP today who recommended she come to ED for IV abx. Denies other complaints at this time. Other than not eating well due to nausea/vomiting while using Bactrim, patient reports normal PO intake. She has been using Tramadol and Tylenol for pain without much relief. Denies headache, dizziness, fever, chills, cough, SOB, chest pain, abdominal pain, diarrhea, constipation, dysuria, hematuria, hematochezia, melena, speech difficulty, trouble eating, confusion or any other complaints.  In the ED: Vitals stable. Labs remarkable for WBC 9.9, Hgb 9.2, Na 133, K 6.4, Cr 4.02, Lactate 2.6. Patient was given Albuterol, insulin with D50, Zofran, Morphine and calcium gluconate. Vanc/Cefepine initiated.   Review of Systems:  All other systems negative unless noted above in HPI.   Past Medical History:  Diagnosis Date  . AKI (acute kidney injury) (HCC) 05/04/2019  . Anemia 10/2015   Acute Blood Loss  . Arthritis    "feel like I have it all over" (08/28/2015)  . Cellulitis and abscess of foot 04/26/2019   left  lower extremity  . CHF (congestive heart failure) (HCC)   . Complication of anesthesia    DIFFICULT WAKING "only when I was smoking; no problems since I quit"  . Coronary artery disease   . Family history of adverse reaction to anesthesia    sister slow to wake up  . Gangrene of left foot (HCC) 10/17/2016  . Gangrene of right foot (HCC) 11/01/2016  . GERD (gastroesophageal reflux disease)    takes Protonix daily   . Hip bursitis   . History of blood transfusion    10/2015  . Hyperlipidemia LDL goal < 70 06/28/2013   takes Atorvastatin daily  . Hypertension    takes Metoprolol and Imdur daily  . Hypoxia 01/2018  . Malnutrition (HCC)   . Migraine    "none in a long time" (08/28/2015)  . Myocardial infarction (HCC) 2011  . Neuromuscular disorder (HCC)    DIABETIC NEUROPATHY  . Osteomyelitis (HCC) 2017   Left foot  . PAD (peripheral artery disease) (HCC)   . Peripheral vascular disease (HCC)   . Respiratory failure (HCC) 10/2015   Acute Hypoxia- acute pulmonary edema 11/13/2015  . Sepsis (HCC) 10/17/2016  . Septic shock (HCC) 10/2015  . Stroke (HCC)   . Type II diabetes mellitus (HCC)    takes Lantus nightly.Average fasting blood sugar runs 80-90  Type II   Past Surgical History:  Procedure Laterality Date  . ABDOMINAL HYSTERECTOMY    . AMPUTATION Right 10/22/2016   Procedure: AMPUTATION DIGIT RIGHT TOES 1-3 POSSIBLE TRANSMETATARSAL;  Surgeon: Edilia Bo,  Di Kindle, MD;  Location: [facility] OR;  Service: Vascular;  Laterality: Right;  . APPENDECTOMY    . ATHERECTOMY N/A 06/04/2011   Procedure: ATHERECTOMY;  Surgeon: Runell Gess, MD;  Location: [facility] CATH LAB;  Service: Cardiovascular;  Laterality: N/A;  . CARDIAC CATHETERIZATION  10/13/2009   95% stenosis in the AV groove circumflex and 95% ostial stenosis in small OM3. A 3x27mm drug-eluting Promus stent inserted ito the circumflex. Dilatated with a 3.25x41mm noncompliant Quantum balloon within entire segment. The entire region was  reduced to 0% and brisk TIMI3 flow.  . CAROTID DUPLEX  03/19/2011   Right ICA-demonstrates complete occlusion. Left ICA-demonstrates a small amount of fibrous plaque.  Marland Kitchen CATARACT EXTRACTION W/ INTRAOCULAR LENS IMPLANT Right   . CESAREAN SECTION  1990  . CORONARY ANGIOPLASTY    . ENDARTERECTOMY FEMORAL Left 09/05/2015   Procedure: ENDARTERECTOMY FEMORAL WITH PROFUNDOPLASTY;  Surgeon: Chuck Hint, MD;  Location: [facility] OR;  Service: Vascular;  Laterality: Left;  Left common femoral artery vein patch using left saphenous vien  . ENDARTERECTOMY FEMORAL Right 10/22/2016   Procedure: ENDARTERECTOMY RIGHT COMMON FEMORAL;  Surgeon: Chuck Hint, MD;  Location: [facility] OR;  Service: Vascular;  Laterality: Right;  . FEMORAL-POPLITEAL BYPASS GRAFT Left 11/02/2015   Procedure: BYPASS GRAFT FEMORAL-POPLITEAL ARTERY VS FEMORAL-TIBIAL ARTERY BYPASS;  Surgeon: Chuck Hint, MD;  Location: [facility] OR;  Service: Vascular;  Laterality: Left;  . FEMORAL-POPLITEAL BYPASS GRAFT Right 10/22/2016   Procedure: BYPASS GRAFT RIGHT FEMORAL- BELOW KNEE POPLITEAL ARTERY WITH VEIN;  Surgeon: Chuck Hint, MD;  [facility] OR;  Service: Vascular;  Laterality: Right;  . I & D EXTREMITY Left 11/10/2015   Procedure: Debridement Left Foot Ulcer, Application  Wound VAC;  Surgeon: [name], MD;  Location: [facility] OR;  Service: Orthopedics;  Laterality: Left;  . ILIAC ARTERY STENT Left 08/28/2015   common  . INTRAOPERATIVE ARTERIOGRAM Left 09/05/2015   Procedure: INTRA OPERATIVE ARTERIOGRAM;  Surgeon: Chuck Hint, MD;  Location: [facility] OR;  Service: Vascular;  Laterality: Left;  . INTRAOPERATIVE ARTERIOGRAM Left 11/02/2015   Procedure: INTRA OPERATIVE ARTERIOGRAM;  Surgeon: Chuck Hint, MD;  Location: [facility] OR;  Service: Vascular;  Laterality: Left;  . INTRAOPERATIVE ARTERIOGRAM Right 10/22/2016   Procedure: INTRA OPERATIVE ARTERIOGRAM;  Surgeon: Chuck Hint, MD;  Location: [facility] OR;  Service:  Vascular;  Laterality: Right;  . LEFT HEART CATH AND CORONARY ANGIOGRAPHY N/A 05/11/2018   Procedure: LEFT HEART CATH AND CORONARY ANGIOGRAPHY;  Surgeon: Lennette Bihari, MD;  Location: [facility] INVASIVE CV LAB;  Service: Cardiovascular;  Laterality: N/A;  Eugenie Birks MYOVIEW  10/25/2010   Moderate perfusion defect due to infarct/scar with mild perinfarct ischemia seen in the Basal Inferolateral, Basal Anterolateral, Mid Inferolateral, and Mid Anterolateral regions. Post-stress EF is 50%.  . LOWER EXTREMITY ANGIOGRAPHY N/A 10/17/2016   Procedure: Lower Extremity Angiography;  Surgeon: Runell Gess, MD;  Location: [facility] INVASIVE CV LAB;  Service: Cardiovascular;  Laterality: N/A;  . OVARY SURGERY  1983?   "ruptured"  . PERIPHERAL VASCULAR ANGIOGRAM  01/26/2010   High-grade SFA disease: left greater than right. Left SFA would require fem-pop bypass grafting. Right SFA could be stented but might require Diamondback Orbital atherectomy.  Marland Kitchen PERIPHERAL VASCULAR ANGIOGRAM  02/23/2010   Stealth Predator orbital rotational atherectomy performed on SFA & Popliteal up to 90,000 RPM. Stenting using overlapping 5x159mm and 5x57mm Absolute Pro Nitinol self-expanding stents beginning just at the knee up to the mid SFA resulting in  reduction of 90-95% calcified SFA & Popliteal stenosis to 0. Stenting performed on the distal common & proximal iliac artery with a 10x4 Absolute Pro- 70-0%.  Marland Kitchen PERIPHERAL VASCULAR ANGIOGRAM  06/17/2010   PTA performed to the right external iliac artery stent using a 5x100 balloon at 10 atmospheres. Stenting performed using a 6x18 Genesis on Opta balloon. Postdilatation with a 7x2 balloon resulting in a 95% "in-stent" stenosis to 0% residual.  . PERIPHERAL VASCULAR ANGIOGRAM  06/04/2011   Bilateral total SFAs not percutaneously addressable. Good canidate for femoropopliteal bypass grafting  . PERIPHERAL VASCULAR ANGIOGRAM  08/28/2015  . PERIPHERAL VASCULAR CATHETERIZATION N/A 08/28/2015    Procedure: Lower Extremity Angiography;  Surgeon: Runell Gess, MD;  Location: [facility] INVASIVE CV LAB;  Service: Cardiovascular;  Laterality: N/A;  . PERIPHERAL VASCULAR CATHETERIZATION N/A 08/28/2015   Procedure: Abdominal Aortogram;  Surgeon: Runell Gess, MD;  Location: [facility] INVASIVE CV LAB;  Service: Cardiovascular;  Laterality: N/A;  . PERIPHERAL VASCULAR CATHETERIZATION Left 08/28/2015   Procedure: Peripheral Vascular Intervention;  Surgeon: Runell Gess, MD;  Location: [facility] INVASIVE CV LAB;  Service: Cardiovascular;  Laterality: Left;  common iliac  . PERIPHERAL VASCULAR CATHETERIZATION Left 08/28/2015   Procedure: Peripheral Vascular Atherectomy;  Surgeon: Runell Gess, MD;  Location: [facility] INVASIVE CV LAB;  Service: Cardiovascular;  Laterality: Left;  common iliac  . PERIPHERAL VASCULAR CATHETERIZATION N/A 09/28/2015   Procedure: Lower Extremity Angiography;  Surgeon: Runell Gess, MD;  Location: [facility] INVASIVE CV LAB;  Service: Cardiovascular;  Laterality: N/A;  . PERIPHERAL VASCULAR CATHETERIZATION Left 09/28/2015   Procedure: Peripheral Vascular Intervention;  Surgeon: Runell Gess, MD;  Location: [facility] INVASIVE CV LAB;  Service: Cardiovascular;  Laterality: Left CFA  PCI with 9 mm x 4 cm Abbott nitinol absolute Pro self-expanding stent     . SKIN SPLIT GRAFT Left 12/01/2015   Procedure: LEFT FOOT SKIN GRAFT AND VAC;  Surgeon: [name], MD;  Location: [facility] OR;  Service: Orthopedics;  Laterality: Left;  . THROMBECTOMY FEMORAL ARTERY Right 10/22/2016   Procedure: THROMBECTOMY FEMORAL ARTERY;  Surgeon: Chuck Hint, MD;  Location: [facility] OR;  Service: Vascular;  Laterality: Right;  . TRANSTHORACIC ECHOCARDIOGRAM  10/17/2009   EF 45-50%, moderate hypokinesis of the entire inferolateral myocardium, mild concentric hypertrophy and mild regurg of the mitral valva.  Marland Kitchen VEIN HARVEST Left 11/02/2015   Procedure: LEFT GREATER SAPHENOUS VEIN HARVEST;  Surgeon: Chuck Hint, MD;   Location: [facility] OR;  Service: Vascular;  Laterality: Left;   Social History:  reports that she quit smoking about 9 years ago. Her smoking use included cigarettes. She has a 61.50 pack-year smoking history. She has never used smokeless tobacco. She reports that she does not drink alcohol or use drugs.  Allergies  Allergen Reactions  . Bactrim [Sulfamethoxazole-Trimethoprim] Nausea And Vomiting  . Doxycycline Other (See Comments)    Lethargy   . Hydrochlorothiazide Other (See Comments)    Lethargy   . Latex Rash  . Penicillins Swelling and Rash    Pt states she has tolerated Keflex in the past without problems. States she may have tolerated Augmentin in the past but it caused GI upset. Has patient had a PCN reaction causing immediate rash, facial/tongue/throat swelling, SOB or lightheadedness with hypotension: Yes Has patient had a PCN reaction causing severe rash involving mucus membranes or skin necrosis: No Has patient had a PCN reaction that required hospitalization No Has patient had a PCN reaction occurring within the last 10  years: No    Family History  Problem Relation Age of Onset  . Hypertension Mother   . Heart failure Mother   . Heart failure Father   . Stroke Father   . Diabetes Father     Prior to Admission medications   Medication Sig Start Date End Date Taking? Authorizing Provider  acetaminophen (TYLENOL) 500 MG tablet Take 1,000-1,500 mg by mouth every 6 (six) hours as needed for headache (pain).   Yes [provider]  albuterol (VENTOLIN HFA) 108 (90 Base) MCG/ACT inhaler Inhale 2 puffs into the lungs every 6 (six) hours as needed for wheezing or shortness of breath.   Yes [provider]  apixaban (ELIQUIS) 5 MG TABS tablet Take 1 tablet (5 mg total) by mouth 2 (two) times daily. 03/25/19  Yes Guilford Shi, MD  atorvastatin (LIPITOR) 80 MG tablet Take 80 mg by mouth at bedtime.    Yes [provider]  clopidogrel (PLAVIX) 75 MG  tablet Take 75 mg by mouth daily.    Yes [provider]  furosemide (LASIX) 40 MG tablet Take 40 mg by mouth 2 (two) times daily.   Yes [provider]  glipiZIDE (GLUCOTROL) 5 MG tablet Take 0.5 tablets (2.5 mg total) by mouth daily before breakfast. 05/07/19  Yes Samtani, Jai-Gurmukh, MD  insulin detemir (LEVEMIR FLEXTOUCH) 100 UNIT/ML FlexPen Inject 35 Units into the skin at bedtime.   Yes [provider]  isosorbide mononitrate (IMDUR) 60 MG 24 hr tablet Take 90 mg by mouth daily. 05/20/19  Yes [provider]  metoprolol succinate (TOPROL-XL) 100 MG 24 hr tablet TAKE 1 AND 1/2 TABLETS BY MOUTH DAILY Patient taking differently: Take 100 mg by mouth See admin instructions. Take one tablet (100 mg) by mouth with a 50 mg tablet for a total dose of 150 mg every evening 03/25/19  Yes Kamineni, Lamount Cranker, MD  metoprolol succinate (TOPROL-XL) 50 MG 24 hr tablet Take 50 mg by mouth See admin instructions. Take one tablet (50 mg) by mouth with a 100 mg tablet for a total dose of 150 mg every evening 05/20/19  Yes [provider]  nitroGLYCERIN (NITROSTAT) 0.4 MG SL tablet Place 0.4 mg under the tongue every 5 (five) minutes as needed for chest pain.  05/15/18  Yes [provider]  pantoprazole (PROTONIX) 40 MG tablet Take 1 tablet (40 mg total) by mouth daily. 12/21/18  Yes Troy Sine, MD  traMADol (ULTRAM) 50 MG tablet Take 50 mg by mouth 3 (three) times daily as needed (pain).  05/19/19  Yes [provider]  furosemide (LASIX) 20 MG tablet Take 2 tablets (40 mg total) by mouth daily. Patient not taking: Reported on 05/31/2019 05/07/19   Nita Sells, MD  isosorbide mononitrate (IMDUR) 30 MG 24 hr tablet Take 3 tablets (90 mg total) by mouth daily. Patient not taking: Reported on 05/31/2019 03/25/19   Guilford Shi, MD   Physical Exam: Vitals:   05/31/19 1740 05/31/19 1900 05/31/19 1930 05/31/19 2000  BP: (!) 123/57 109/61 119/70 133/67    Pulse: (!) 59 60 (!) 41 63  Resp: 19 18 20 17   Temp:      SpO2: 95% 99% 98% 100%  Weight:      Height:        Wt Readings from Last 3 Encounters:  05/31/19 83.9 kg  05/07/19 82.4 kg  04/25/19 76.8 kg    . General:  Appears calm and comfortable. AAOx4.  . Eyes: EOMI, normal  lids, irises & conjunctiva . ENT: grossly normal hearing, lips & tongue . Neck: normal ROM . Cardiovascular: RRR, no m/r/g. 2+ LE pitting edema to thighs bilaterally.  Marland Kitchen Respiratory: CTA bilaterally, no w/r/r. Normal respiratory effort. . Abdomen: soft, ntnd . Skin: LLE as pictured below. RLE with blister on top of right foot and s/p amputation of several right toes. . Musculoskeletal: grossly normal tone BUE/BLE . Psychiatric: grossly normal mood and affect, speech fluent and appropriate . Neurologic: grossly non-focal.             Labs on Admission:  Basic Metabolic Panel: Recent Labs  Lab 05/31/19 1743  NA 133*  K 6.4*  CL 102  CO2 18*  GLUCOSE 148*  BUN 82*  CREATININE 4.02*  CALCIUM 8.9   Liver Function Tests: Recent Labs  Lab 05/31/19 1743  AST 32  ALT 20  ALKPHOS 121  BILITOT 1.2  PROT 6.6  ALBUMIN 2.9*   No results for input(s): LIPASE, AMYLASE in the last 168 hours. No results for input(s): AMMONIA in the last 168 hours. CBC: Recent Labs  Lab 05/31/19 1743  WBC 9.9  NEUTROABS 7.3  HGB 9.2*  HCT 30.9*  MCV 75.6*  PLT 404*   Cardiac Enzymes: No results for input(s): CKTOTAL, CKMB, CKMBINDEX, TROPONINI in the last 168 hours.  BNP (last 3 results) Recent Labs    03/20/19 1157 03/23/19 0930 04/25/19 0233  BNP 2,161.2* 2,115.8* 3,075.3*    ProBNP (last 3 results) No results for input(s): PROBNP in the last 8760 hours.  CBG: No results for input(s): GLUCAP in the last 168 hours.  Radiological Exams on Admission: No results found.  EKG: Independently reviewed. HR 60. Sinus rhythm. QTc 475. No STEMI. No peaked T waves.  Assessment/Plan Principal  Problem:   Wound infection Active Problems:   PAD (peripheral artery disease) (HCC)   Coronary artery disease involving native coronary artery of native heart without angina pectoris   Claudication in peripheral vascular disease:  Lifestyle limiting.   Hx of tobacco use, presenting hazards to health   Diabetes mellitus type II, uncontrolled (HCC)   Hyperlipidemia with target LDL less than 70   PVD (peripheral vascular disease) with claudication (HCC)   S/P femoral-popliteal bypass surgery   Chronic systolic CHF (congestive heart failure) (HCC)   DM type 2 with diabetic peripheral neuropathy (HCC)   Stage 3b chronic kidney disease   Hyperkalemia   Chronic a-fib (HCC)   Cellulitis   Acute kidney injury superimposed on CKD (HCC)  69 y.o. female with PMH of DM; HTN; stage 3a CKD; CAD; PVD; chronic combined CHF; CVA; HLD; afib on Eliquis; and COPD presenting for left leg wound infection and admitted for cellulitis, hyperkalemia and AKI on CKD.  Cellulitis Chronic Venous Insufficiency -as pictured above -patient with chronic venous insufficiency but reports that she has never seen her leg so red nor has it been this painful -cont Vanc/Cefepime -elevated lactate>gently give 1 L IVF on admission and repeat lactate ordered -WBC and vitals WNL -Wound Care consulted  AKI on stage 3b CKD -Baseline creatinine was ~ 1.7 in March but now appears to be closer to 2.5 -Cr 4.02 on admission -Urine Urea and Urine Creatinine ordered on admission -Difficult picture to ascertain whether from fluid overload or fluid down; patient reports improved swelling in lower extremities but also worse swelling in thighs, no crackles on lung ascultation, hx n/v and elevated lactate>will gently give 1 L of IVF on admission to help with  lactate but may also need diuresis  -Likely due to prerenal failure secondary to dehydration associated with nausea and vomiting from Bactrim and poor PO intake with pain -Hold  diuretics for now -IVF  -Follow up renal function by BMP -Avoid ACEI and NSAIDs -Consult Renal PRN -Renally-dose meds (Eliquis halved to 5 mg daily for poor renal function)  Hyperkalemia -Likely associated with AKI -s/p insulin with D50, Albuterol and calcium gluconate -Kayexalate ordered -May need lasix restarted but will hold for now as explained above -Recheck at 0000 and 0500  Chronic combined congestive heart failure:  -Difficult to assess fluid overload vs hypovolemia at this time -Tele -Recent Echo on 2/21 with EF 25-30% and grade 3 diastolic dysfunction -Resume Lasix when able   CAD/PAD -Continue Plavix and Imdur -No current concern for ACS  HLD  -Continue Lipitor  DM -Last A1c 7.1 -Levemir held as glucose 140's on admission and with CKD -Sliding scale and restart home Levemir when able  -Hold Glucophage  HTN -Continue Toprol XL at 100 mg instead of 150 mg due to low HR  Afib -Rate controlled with Toprol -Continue Eliquis for [facility]  Debility/deconditioning -Patient was recommended for SNF placement during previous hospitalization and declined -She also declined home health services -PT/OT consultation  Code Status: Full DVT Prophylaxis: Eliquis (halved to 5 mg daily due to CrCl 13) Family Communication: None Disposition Plan: Admit to inpatient. Patient is at high risk for further decompensation due to age and co-morbidities. Anticipate discharge home in 5-7 days.   Time spent: 70 minutes  Joselyn Arrow, MD [facility] Pager [phone]

## 2019-06-01 ENCOUNTER — Other Ambulatory Visit: Payer: Self-pay

## 2019-06-01 ENCOUNTER — Encounter (HOSPITAL_COMMUNITY): Payer: Self-pay | Admitting: Family Medicine

## 2019-06-01 DIAGNOSIS — I739 Peripheral vascular disease, unspecified: Secondary | ICD-10-CM

## 2019-06-01 DIAGNOSIS — I482 Chronic atrial fibrillation, unspecified: Secondary | ICD-10-CM | POA: Diagnosis not present

## 2019-06-01 DIAGNOSIS — I2510 Atherosclerotic heart disease of native coronary artery without angina pectoris: Secondary | ICD-10-CM

## 2019-06-01 DIAGNOSIS — N179 Acute kidney failure, unspecified: Secondary | ICD-10-CM | POA: Diagnosis not present

## 2019-06-01 DIAGNOSIS — T148XXA Other injury of unspecified body region, initial encounter: Secondary | ICD-10-CM | POA: Diagnosis not present

## 2019-06-01 DIAGNOSIS — L03116 Cellulitis of left lower limb: Secondary | ICD-10-CM | POA: Diagnosis not present

## 2019-06-01 LAB — GLUCOSE, CAPILLARY
Glucose-Capillary: 80 mg/dL (ref 70–99)
Glucose-Capillary: 90 mg/dL (ref 70–99)
Glucose-Capillary: 137 mg/dL — ABNORMAL HIGH (ref 70–99)
Glucose-Capillary: 136 mg/dL — ABNORMAL HIGH (ref 70–99)
Glucose-Capillary: 111 mg/dL — ABNORMAL HIGH (ref 70–99)

## 2019-06-01 LAB — COMPREHENSIVE METABOLIC PANEL
ALT: 17 U/L (ref 0–44)
AST: 29 U/L (ref 15–41)
Albumin: 2.5 g/dL — ABNORMAL LOW (ref 3.5–5.0)
Alkaline Phosphatase: 96 U/L (ref 38–126)
Anion gap: 13 (ref 5–15)
BUN: 74 mg/dL — ABNORMAL HIGH (ref 8–23)
CO2: 15 mmol/L — ABNORMAL LOW (ref 22–32)
Calcium: 8 mg/dL — ABNORMAL LOW (ref 8.9–10.3)
Chloride: 107 mmol/L (ref 98–111)
Creatinine, Ser: 3.37 mg/dL — ABNORMAL HIGH (ref 0.44–1.00)
GFR calc Af Amer: 15 mL/min — ABNORMAL LOW (ref >60)
GFR calc non Af Amer: 13 mL/min — ABNORMAL LOW (ref >60)
Glucose, Bld: 78 mg/dL (ref 70–99)
Potassium: 5.1 mmol/L (ref 3.5–5.1)
Sodium: 135 mmol/L (ref 135–145)
Total Bilirubin: 0.9 mg/dL (ref 0.3–1.2)
Total Protein: 5.7 g/dL — ABNORMAL LOW (ref 6.5–8.1)

## 2019-06-01 LAB — CBC
HCT: 28.9 % — ABNORMAL LOW (ref 36.0–46.0)
Hemoglobin: 8.5 g/dL — ABNORMAL LOW (ref 12.0–15.0)
MCH: 22.1 pg — ABNORMAL LOW (ref 26.0–34.0)
MCHC: 29.4 g/dL — ABNORMAL LOW (ref 30.0–36.0)
MCV: 75.1 fL — ABNORMAL LOW (ref 80.0–100.0)
Platelets: 347 10*3/uL (ref 150–400)
RBC: 3.85 MIL/uL — ABNORMAL LOW (ref 3.87–5.11)
RDW: 19.8 % — ABNORMAL HIGH (ref 11.5–15.5)
WBC: 9.5 10*3/uL (ref 4.0–10.5)
nRBC: 1.7 % — ABNORMAL HIGH (ref 0.0–0.2)

## 2019-06-01 LAB — LACTIC ACID, PLASMA: Lactic Acid, Venous: 2.2 mmol/L (ref 0.5–1.9)

## 2019-06-01 MED ORDER — OXYCODONE HCL 5 MG PO TABS
5.0000 mg | ORAL_TABLET | ORAL | Status: DC | PRN
  Administered 2019-06-01 – 2019-06-05 (×5): 5 mg via ORAL
  Filled 2019-06-01 (×5): qty 1

## 2019-06-01 MED ORDER — HYDROMORPHONE HCL 1 MG/ML IJ SOLN
0.5000 mg | INTRAMUSCULAR | Status: DC | PRN
  Administered 2019-06-01 – 2019-06-05 (×4): 0.5 mg via INTRAVENOUS
  Filled 2019-06-01 (×4): qty 1

## 2019-06-01 MED ORDER — ONDANSETRON HCL 4 MG/2ML IJ SOLN
4.0000 mg | Freq: Four times a day (QID) | INTRAMUSCULAR | Status: DC | PRN
  Administered 2019-06-01 – 2019-06-02 (×2): 4 mg via INTRAVENOUS
  Filled 2019-06-01 (×2): qty 2

## 2019-06-01 MED ORDER — LINEZOLID 600 MG/300ML IV SOLN
600.0000 mg | Freq: Two times a day (BID) | INTRAVENOUS | Status: DC
  Administered 2019-06-01 – 2019-06-05 (×9): 600 mg via INTRAVENOUS
  Filled 2019-06-01 (×10): qty 300

## 2019-06-01 MED ORDER — APIXABAN 5 MG PO TABS
5.0000 mg | ORAL_TABLET | Freq: Two times a day (BID) | ORAL | Status: DC
  Administered 2019-06-02 – 2019-06-03 (×3): 5 mg via ORAL
  Filled 2019-06-01 (×3): qty 1

## 2019-06-01 MED ORDER — ALBUTEROL SULFATE (2.5 MG/3ML) 0.083% IN NEBU: 3.0000 mL | INHALATION_SOLUTION | Freq: Four times a day (QID) | RESPIRATORY_TRACT | Status: DC | PRN

## 2019-06-01 MED ORDER — FUROSEMIDE 10 MG/ML IJ SOLN
40.0000 mg | Freq: Two times a day (BID) | INTRAMUSCULAR | Status: DC
  Administered 2019-06-01 – 2019-06-02 (×4): 40 mg via INTRAVENOUS
  Filled 2019-06-01 (×4): qty 4

## 2019-06-01 MED ORDER — METOPROLOL SUCCINATE ER 100 MG PO TB24
100.0000 mg | ORAL_TABLET | Freq: Every day | ORAL | Status: DC
  Administered 2019-06-01 – 2019-06-05 (×5): 100 mg via ORAL
  Filled 2019-06-01 (×5): qty 1

## 2019-06-01 NOTE — Progress Notes (Addendum)
PROGRESS NOTE  [name] [name] XKG:[id number] DOB: [birth year] DOA: 05/31/2019 PCP: Georgann Housekeeper, MD   LOS: 1 day   Brief Narrative / Interim history: 69 year old medically complex female with history of DM2, HTN, chronic kidney disease, coronary artery disease, PVD, chronic combined CHF, prior CVA, hyperlipidemia, A. fib on Eliquis, COPD comes into the hospital with complaints of left leg wound infection/cellulitis, hyperkalemia and acute kidney injury.  Patient has had worsening pain and redness of the left lower extremity for the last 3 days.  She was on Bactrim as an outpatient but she stopped last week due to nausea and vomiting.  She was admitted to the hospital on vancomycin and cefepime.  Subjective / 24h Interval events: Seen at bedside today with wound care nurse.  Patient tells me that she has been having this large blisters forming, 1 on her left leg week ago which burst and then the leg started getting red and painful, she also has a blister that has not burst yet on the right foot and also tells me that she had a blister on her hard palate last time she was in the hospital.  Assessment & Plan: Principal Problem Left lower extremity cellulitis in the setting of chronic venous insufficiency and chronic edema-patient has severe cellulitis to the left lower extremity, continue vancomycin and cefepime, blood cultures are pending.  Pharmacy contacted me that there is a shortage of reagents to assess vancomycin levels and patient will be changed to linezolid.  She appears to have failed Bactrim as an outpatient -Discussed at bedside wound care nurse, she tells me that this is significantly worse compared to last month  Active Problems Bullous skin lesions-these can definitely be caused by acute on chronic lower extremity swelling however it would not explain the lesion that she noticed in her mouth last time she was in the hospital.  Need to rule out autoimmune skin conditions, I have asked  general surgery for a skin biopsy.  Appreciate input.  Hold Eliquis tonight and tomorrow morning to allow biopsy tomorrow, resume it tomorrow evening  Acute kidney injury on chronic kidney disease stage IIIb-her baseline creatinine was around 1.7 in March but recently around 2.5, during this admission creatinine was 4.02.  She was placed on IV fluids however this morning appears fluid overloaded given lower extremity swelling.  She is normotensive, discontinue IV fluids and start diuresis  Acute on chronic combined CHF-most recent 2D echo done in February 2021 showed an EF of 25-30%, grade 3 diastolic dysfunction.  Currently appears fluid overloaded, not sure to what degree her lower extremity swelling is chronic but will start diuresis as mentioned above.  Stop IV fluids  Coronary artery disease/PAD-no chest pain, no concern for ACS.  Continue Plavix, Imdur  Type 2 diabetes mellitus-her most recent A1c was 7.1, her Levemir has been held on admission due to low CBGs, placed on sliding scale.  CBGs are still low this morning, continue to hold Levemir and keep on sliding scale alone  CBG (last 3)  Recent Labs    05/31/19 2110 06/01/19 0347 06/01/19 0758  GLUCAP 89 80 90   Permanent A. fib-continue metoprolol for rate control, continue Eliquis for anticoagulation  Hyperlipidemia-continue atorvastatin  Essential hypertension-continue metoprolol, she was started on a lower dose than home due to heart rate into the 60s in the ED  Scheduled Meds: . apixaban  5 mg Oral BID  . atorvastatin  80 mg Oral QHS  . clopidogrel  75 mg Oral Daily  .  furosemide  40 mg Intravenous BID  . insulin aspart  0-5 Units Subcutaneous QHS  . insulin aspart  0-9 Units Subcutaneous TID WC  . isosorbide mononitrate  90 mg Oral Daily  . metoprolol succinate  100 mg Oral Daily  . pantoprazole  40 mg Oral Daily   Continuous Infusions: . ceFEPime (MAXIPIME) IV    . linezolid (ZYVOX) IV     PRN  Meds:.acetaminophen, albuterol, HYDROmorphone (DILAUDID) injection, oxyCODONE, traMADol  DVT prophylaxis: On Eliquis Code Status: Full code Family Communication: No family present at bedside  Status is: Inpatient  Remains inpatient appropriate because:IV treatments appropriate due to intensity of illness or inability to take PO   Dispo: The patient is from: Home              Anticipated d/c is to: Home              Anticipated d/c date is: 3 days              Patient currently is not medically stable to d/c.  Consultants:  None  Procedures:  None   Microbiology  Blood cultures 5/3-no growth today  Antimicrobials: Vancomycin 5/3-5/4 (stopped at pharmacy request due to shortage of reagents for vancomycin levels) Linezolid 5/4 >> Cefepime 5/4 >>   Objective: Vitals:   06/01/19 0045 06/01/19 0144 06/01/19 0800 06/01/19 0848  BP: (!) 138/51 129/61 (!) 106/55 (!) 119/57  Pulse: 60  73 76  Resp: 14  18 14   Temp:  98.2 F (36.8 C) 97.6 F (36.4 C)   TempSrc:  Oral Oral   SpO2: 98%  94% 98%  Weight:      Height:        Intake/Output Summary (Last 24 hours) at 06/01/2019 1125 Last data filed at 06/01/2019 0400 Gross per 24 hour  Intake 950 ml  Output --  Net 950 ml   Filed Weights   05/31/19 1703  Weight: 83.9 kg    Examination:  Constitutional: Appears uncomfortable Eyes: no scleral icterus ENMT: Mucous membranes are moist.  No lesions in her mouth Neck: normal, supple Respiratory: Crackles at the bases, no wheezing.  Normal respiratory effort Cardiovascular: Irregular, no murmurs appreciated.  2+ pitting lower extremity edema.   Abdomen: non distended, no tenderness. Bowel sounds positive.  Musculoskeletal: no clubbing / cyanosis.  Skin: Diffuse intensely red cellulitic rash on the left lower extremity (see pictures below), bullous lesion on the right foot Neurologic: No focal deficits, equal strength Psychiatric: Normal judgment and insight. Alert and  oriented x 3. Normal mood.         Data Reviewed: I have independently reviewed following labs and imaging studies   CBC: Recent Labs  Lab 05/31/19 1743 06/01/19 0153  WBC 9.9 9.5  NEUTROABS 7.3  --   HGB 9.2* 8.5*  HCT 30.9* 28.9*  MCV 75.6* 75.1*  PLT 404* 347   Basic Metabolic Panel: Recent Labs  Lab 05/31/19 1743 05/31/19 1924 06/01/19 0153  NA 133*  --  135  K 6.4*  --  5.1  CL 102  --  107  CO2 18*  --  15*  GLUCOSE 148*  --  78  BUN 82*  --  74*  CREATININE 4.02*  --  3.37*  CALCIUM 8.9  --  8.0*  MG  --  2.1  --    Liver Function Tests: Recent Labs  Lab 05/31/19 1743 06/01/19 0153  AST 32 29  ALT 20 17  ALKPHOS 121  96  BILITOT 1.2 0.9  PROT 6.6 5.7*  ALBUMIN 2.9* 2.5*   Coagulation Profile: No results for input(s): INR, PROTIME in the last 168 hours. HbA1C: No results for input(s): HGBA1C in the last 72 hours. CBG: Recent Labs  Lab 05/31/19 2110 06/01/19 0347 06/01/19 0758  GLUCAP 89 80 90    Recent Results (from the past 240 hour(s))  Blood culture (routine x 2)     Status: None (Preliminary result)   Collection Time: 05/31/19  5:18 PM   Specimen: BLOOD RIGHT ARM  Result Value Ref Range Status   Specimen Description BLOOD RIGHT ARM  Final   Special Requests   Final    BOTTLES DRAWN AEROBIC AND ANAEROBIC Blood Culture adequate volume   Culture   Final    NO GROWTH < 12 HOURS Performed at [facility], [place] [address], [place], [place] [address]    Report Status PENDING  Incomplete  Blood culture (routine x 2)     Status: None (Preliminary result)   Collection Time: 05/31/19  5:23 PM   Specimen: BLOOD RIGHT HAND  Result Value Ref Range Status   Specimen Description BLOOD RIGHT HAND  Final   Special Requests   Final    BOTTLES DRAWN AEROBIC AND ANAEROBIC Blood Culture results may not be optimal due to an inadequate volume of blood received in culture bottles   Culture   Final    NO GROWTH < 12 HOURS Performed at [facility], [place] [address], [place], [place] [address]    Report Status PENDING  Incomplete  Respiratory Panel by RT PCR (Flu A&B, Covid) - Nasopharyngeal Swab     Status: None   Collection Time: 05/31/19  6:45 PM   Specimen: Nasopharyngeal Swab  Result Value Ref Range Status   SARS Coronavirus 2 by RT PCR NEGATIVE NEGATIVE Final    Comment: (NOTE) SARS-CoV-2 target nucleic acids are NOT DETECTED. The SARS-CoV-2 RNA is generally detectable in upper respiratoy specimens during the acute phase of infection. The lowest concentration of SARS-CoV-2 viral copies this assay can detect is 131 copies/mL. A negative result does not preclude SARS-Cov-2 infection and should not be used as the sole basis for treatment or other patient management decisions. A negative result may occur with  improper specimen collection/handling, submission of specimen other than nasopharyngeal swab, presence of viral mutation(s) within the areas targeted by this assay, and inadequate number of viral copies (<131 copies/mL). A negative result must be combined with clinical observations, patient history, and epidemiological information. The expected result is Negative. Fact Sheet for Patients:  PinkCheek.be Fact Sheet for Healthcare Providers:  GravelBags.it This test is not yet ap proved or cleared by the [place] FDA and  has been authorized for detection and/or diagnosis of SARS-CoV-2 by FDA under an Emergency Use Authorization (EUA). This EUA will remain  in effect (meaning this test can be used) for the duration of the COVID-19 declaration under Section 564(b)(1) of the Act, 21 U.S.C. section 360bbb-3(b)(1), unless the authorization is terminated or revoked sooner.    Influenza A by PCR NEGATIVE NEGATIVE Final   Influenza B by PCR NEGATIVE NEGATIVE Final    Comment: (NOTE) The Xpert Xpress SARS-CoV-2/FLU/RSV assay is intended as an aid in  the  diagnosis of influenza from Nasopharyngeal swab specimens and  should not be used as a sole basis for treatment. Nasal washings and  aspirates are unacceptable for Xpert Xpress SARS-CoV-2/FLU/RSV  testing. Fact Sheet for  Patients: https://www.moore.com/ Fact Sheet for Healthcare Providers: https://www.young.biz/ This test is not yet approved or cleared by the [place] FDA and  has been authorized for detection and/or diagnosis of SARS-CoV-2 by  FDA under an Emergency Use Authorization (EUA). This EUA will remain  in effect (meaning this test can be used) for the duration of the  Covid-19 declaration under Section 564(b)(1) of the Act, 21  U.S.C. section 360bbb-3(b)(1), unless the authorization is  terminated or revoked. Performed at [facility], [address], [place], [place] [address]      Radiology Studies: No results found.  Pamella Pert, MD, PhD [facility]  Between 7 am - 7 pm I am available, please contact me via Amion or Securechat  Between 7 pm - 7 am I am not available, please contact night coverage MD/APP via Amion

## 2019-06-01 NOTE — Consult Note (Addendum)
[name] [birth year]  [id number].    Requesting MD: Dr.Gherghe  Chief Complaint/Reason for Consult: Request for skin biopsy  HPI: [name] [name] is a 69 y.o. female with a history of HTN, HLD, DM2, CKD, CAD, PAD on Plavix, prior CVA, CHF, COPD and A fib on Eliquis who we are asked to see for skin biopsy.  Patient reports that she has been dealing with blisters on her lower extremities since March.  It appears she first presented to the hospital on 3/27 with a 2-day history of blister on her lower extremities with bilateral lower extremity edema.  She was treated with IV antibiotics and Lasix.  She was discharged on 3/30.  She reports after discharge the bulla on her left lower extremity drained at home.  She has been using unna boots at home for this. She has been following with outpatient clinic for managament. Was sent to ED on 5/3 for wound check.  She was admitted to the hospitalist service.  We are asked to see for skin biopsy as patient states that she had a bullous lesion inside her mouth during sometime in April.  She is unsure the exact date.  She currently denies any bullous lesions on any mucosal surfaces.  ROS: Review of Systems  Constitutional: Negative for chills and fever.  Cardiovascular: Negative for chest pain.  Gastrointestinal: Negative for abdominal pain, nausea and vomiting.  Genitourinary: Negative for dysuria.  Skin: Positive for rash.  All other systems reviewed and are negative.   Family History  Problem Relation Age of Onset  . Hypertension Mother   . Heart failure Mother   . Heart failure Father   . Stroke Father   . Diabetes Father     Past Medical History:  Diagnosis Date  . AKI (acute kidney injury) (HCC) 05/04/2019  . Anemia 10/2015   Acute Blood Loss  . Arthritis    "feel like I have it all over" (08/28/2015)  . Cellulitis and abscess of foot 04/26/2019   left lower extremity  . CHF (congestive heart failure) (HCC)   . Complication of  anesthesia    DIFFICULT WAKING "only when I was smoking; no problems since I quit"  . Coronary artery disease   . Family history of adverse reaction to anesthesia    sister slow to wake up  . Gangrene of left foot (HCC) 10/17/2016  . Gangrene of right foot (HCC) 11/01/2016  . GERD (gastroesophageal reflux disease)    takes Protonix daily   . Hip bursitis   . History of blood transfusion    10/2015  . Hyperlipidemia LDL goal < 70 06/28/2013   takes Atorvastatin daily  . Hypertension    takes Metoprolol and Imdur daily  . Hypoxia 01/2018  . Malnutrition (HCC)   . Migraine    "none in a long time" (08/28/2015)  . Myocardial infarction (HCC) 2011  . Neuromuscular disorder (HCC)    DIABETIC NEUROPATHY  . Osteomyelitis (HCC) 2017   Left foot  . PAD (peripheral artery disease) (HCC)   . Peripheral vascular disease (HCC)   . Respiratory failure (HCC) 10/2015   Acute Hypoxia- acute pulmonary edema 11/13/2015  . Sepsis (HCC) 10/17/2016  . Septic shock (HCC) 10/2015  . Stroke (HCC)   . Type II diabetes mellitus (HCC)    takes Lantus nightly.Average fasting blood sugar runs 80-90  Type II    Past Surgical History:  Procedure Laterality Date  . ABDOMINAL HYSTERECTOMY    .  AMPUTATION Right 10/22/2016   Procedure: AMPUTATION DIGIT RIGHT TOES 1-3 POSSIBLE TRANSMETATARSAL;  Surgeon: Angelia Mould, MD;  Location: [place];  Service: Vascular;  Laterality: Right;  . APPENDECTOMY    . ATHERECTOMY N/A 06/04/2011   Procedure: ATHERECTOMY;  Surgeon: Lorretta Harp, MD;  Location: [facility] CATH LAB;  Service: Cardiovascular;  Laterality: N/A;  . CARDIAC CATHETERIZATION  10/13/2009   95% stenosis in the AV groove circumflex and 95% ostial stenosis in small OM3. A 3x86mm drug-eluting Promus stent inserted ito the circumflex. Dilatated with a 3.25x35mm noncompliant Quantum balloon within entire segment. The entire region was reduced to 0% and brisk TIMI3 flow.  . CAROTID DUPLEX  03/19/2011   Right  ICA-demonstrates complete occlusion. Left ICA-demonstrates a small amount of fibrous plaque.  Marland Kitchen CATARACT EXTRACTION W/ INTRAOCULAR LENS IMPLANT Right   . CESAREAN SECTION  1990  . CORONARY ANGIOPLASTY    . ENDARTERECTOMY FEMORAL Left 09/05/2015   Procedure: ENDARTERECTOMY FEMORAL WITH PROFUNDOPLASTY;  Surgeon: Angelia Mould, MD;  Location: [facility] OR;  Service: Vascular;  Laterality: Left;  Left common femoral artery vein patch using left saphenous vien  . ENDARTERECTOMY FEMORAL Right 10/22/2016   Procedure: ENDARTERECTOMY RIGHT COMMON FEMORAL;  Surgeon: Angelia Mould, MD;  Location: [place];  Service: Vascular;  Laterality: Right;  . FEMORAL-POPLITEAL BYPASS GRAFT Left 11/02/2015   Procedure: BYPASS GRAFT FEMORAL-POPLITEAL ARTERY VS FEMORAL-TIBIAL ARTERY BYPASS;  Surgeon: Angelia Mould, MD;  Location: [place];  Service: Vascular;  Laterality: Left;  . FEMORAL-POPLITEAL BYPASS GRAFT Right 10/22/2016   Procedure: BYPASS GRAFT RIGHT FEMORAL- BELOW KNEE POPLITEAL ARTERY WITH VEIN;  Surgeon: Angelia Mould, MD;  Location: [place];  Service: Vascular;  Laterality: Right;  . I & D EXTREMITY Left 11/10/2015   Procedure: Debridement Left Foot Ulcer, Application  Wound VAC;  Surgeon: Newt Minion, MD;  Location: [place];  Service: Orthopedics;  Laterality: Left;  . ILIAC ARTERY STENT Left 08/28/2015   common  . INTRAOPERATIVE ARTERIOGRAM Left 09/05/2015   Procedure: INTRA OPERATIVE ARTERIOGRAM;  Surgeon: Angelia Mould, MD;  Location: [place];  Service: Vascular;  Laterality: Left;  . INTRAOPERATIVE ARTERIOGRAM Left 11/02/2015   Procedure: INTRA OPERATIVE ARTERIOGRAM;  Surgeon: Angelia Mould, MD;  Location: [place];  Service: Vascular;  Laterality: Left;  . INTRAOPERATIVE ARTERIOGRAM Right 10/22/2016   Procedure: INTRA OPERATIVE ARTERIOGRAM;  Surgeon: Angelia Mould, MD;  Location: [place];  Service: Vascular;  Laterality: Right;  . LEFT HEART CATH AND CORONARY ANGIOGRAPHY  N/A 05/11/2018   Procedure: LEFT HEART CATH AND CORONARY ANGIOGRAPHY;  Surgeon: Troy Sine, MD;  Location: [place] CV LAB;  Service: Cardiovascular;  Laterality: N/A;  Carlton Adam MYOVIEW  10/25/2010   Moderate perfusion defect due to infarct/scar with mild perinfarct ischemia seen in the Basal Inferolateral, Basal Anterolateral, Mid Inferolateral, and Mid Anterolateral regions. Post-stress EF is 50%.  . LOWER EXTREMITY ANGIOGRAPHY N/A 10/17/2016   Procedure: Lower Extremity Angiography;  Surgeon: Lorretta Harp, MD;  Location: [place] CV LAB;  Service: Cardiovascular;  Laterality: N/A;  . OVARY SURGERY  1983?   "ruptured"  . PERIPHERAL VASCULAR ANGIOGRAM  01/26/2010   High-grade SFA disease: left greater than right. Left SFA would require fem-pop bypass grafting. Right SFA could be stented but might require Diamondback Orbital atherectomy.  Marland Kitchen PERIPHERAL VASCULAR ANGIOGRAM  02/23/2010   Stealth Predator orbital rotational atherectomy performed on SFA & Popliteal up to 90,000 RPM. Stenting using overlapping 5x123mm and 5x45mm Absolute  Pro Nitinol self-expanding stents beginning just at the knee up to the mid SFA resulting in reduction of 90-95% calcified SFA & Popliteal stenosis to 0. Stenting performed on the distal common & proximal iliac artery with a 10x4 Absolute Pro- 70-0%.  Marland Kitchen PERIPHERAL VASCULAR ANGIOGRAM  06/17/2010   PTA performed to the right external iliac artery stent using a 5x100 balloon at 10 atmospheres. Stenting performed using a 6x18 Genesis on Opta balloon. Postdilatation with a 7x2 balloon resulting in a 95% "in-stent" stenosis to 0% residual.  . PERIPHERAL VASCULAR ANGIOGRAM  06/04/2011   Bilateral total SFAs not percutaneously addressable. Good canidate for femoropopliteal bypass grafting  . PERIPHERAL VASCULAR ANGIOGRAM  08/28/2015  . PERIPHERAL VASCULAR CATHETERIZATION N/A 08/28/2015   Procedure: Lower Extremity Angiography;  Surgeon: Runell Gess, MD;  Location:  [facility] INVASIVE CV LAB;  Service: Cardiovascular;  Laterality: N/A;  . PERIPHERAL VASCULAR CATHETERIZATION N/A 08/28/2015   Procedure: Abdominal Aortogram;  Surgeon: Runell Gess, MD;  Location: [facility] INVASIVE CV LAB;  Service: Cardiovascular;  Laterality: N/A;  . PERIPHERAL VASCULAR CATHETERIZATION Left 08/28/2015   Procedure: Peripheral Vascular Intervention;  Surgeon: Runell Gess, MD;  Location: [facility] INVASIVE CV LAB;  Service: Cardiovascular;  Laterality: Left;  common iliac  . PERIPHERAL VASCULAR CATHETERIZATION Left 08/28/2015   Procedure: Peripheral Vascular Atherectomy;  Surgeon: Runell Gess, MD;  Location: [facility] INVASIVE CV LAB;  Service: Cardiovascular;  Laterality: Left;  common iliac  . PERIPHERAL VASCULAR CATHETERIZATION N/A 09/28/2015   Procedure: Lower Extremity Angiography;  Surgeon: Runell Gess, MD;  Location: [facility] INVASIVE CV LAB;  Service: Cardiovascular;  Laterality: N/A;  . PERIPHERAL VASCULAR CATHETERIZATION Left 09/28/2015   Procedure: Peripheral Vascular Intervention;  Surgeon: Runell Gess, MD;  Location: [facility] INVASIVE CV LAB;  Service: Cardiovascular;  Laterality: Left CFA  PCI with 9 mm x 4 cm Abbott nitinol absolute Pro self-expanding stent     . SKIN SPLIT GRAFT Left 12/01/2015   Procedure: LEFT FOOT SKIN GRAFT AND VAC;  Surgeon: [name], MD;  Location: [facility] OR;  Service: Orthopedics;  Laterality: Left;  . THROMBECTOMY FEMORAL ARTERY Right 10/22/2016   Procedure: THROMBECTOMY FEMORAL ARTERY;  Surgeon: Chuck Hint, MD;  Location: [facility] OR;  Service: Vascular;  Laterality: Right;  . TRANSTHORACIC ECHOCARDIOGRAM  10/17/2009   EF 45-50%, moderate hypokinesis of the entire inferolateral myocardium, mild concentric hypertrophy and mild regurg of the mitral valva.  Marland Kitchen VEIN HARVEST Left 11/02/2015   Procedure: LEFT GREATER SAPHENOUS VEIN HARVEST;  Surgeon: Chuck Hint, MD;  Location: [facility] OR;  Service: Vascular;  Laterality: Left;    Social History:  reports  that she quit smoking about 9 years ago. Her smoking use included cigarettes. She has a 61.50 pack-year smoking history. She has never used smokeless tobacco. She reports that she does not drink alcohol or use drugs.  Allergies:  Allergies  Allergen Reactions  . Bactrim [Sulfamethoxazole-Trimethoprim] Nausea And Vomiting  . Doxycycline Other (See Comments)    Lethargy   . Hydrochlorothiazide Other (See Comments)    Lethargy   . Latex Rash  . Penicillins Swelling and Rash    Pt states she has tolerated Keflex in the past without problems. States she may have tolerated Augmentin in the past but it caused GI upset. Has patient had a PCN reaction causing immediate rash, facial/tongue/throat swelling, SOB or lightheadedness with hypotension: Yes Has patient had a PCN reaction causing severe rash involving mucus membranes or skin necrosis: No Has patient  had a PCN reaction that required hospitalization No Has patient had a PCN reaction occurring within the last 10 years: No    Medications Prior to Admission  Medication Sig Dispense Refill  . acetaminophen (TYLENOL) 500 MG tablet Take 1,000-1,500 mg by mouth every 6 (six) hours as needed for headache (pain).    Marland Kitchen albuterol (VENTOLIN HFA) 108 (90 Base) MCG/ACT inhaler Inhale 2 puffs into the lungs every 6 (six) hours as needed for wheezing or shortness of breath.    Marland Kitchen apixaban (ELIQUIS) 5 MG TABS tablet Take 1 tablet (5 mg total) by mouth 2 (two) times daily. 60 tablet 3  . atorvastatin (LIPITOR) 80 MG tablet Take 80 mg by mouth at bedtime.     . clopidogrel (PLAVIX) 75 MG tablet Take 75 mg by mouth daily.     . furosemide (LASIX) 40 MG tablet Take 40 mg by mouth 2 (two) times daily.    Marland Kitchen glipiZIDE (GLUCOTROL) 5 MG tablet Take 0.5 tablets (2.5 mg total) by mouth daily before breakfast. 30 tablet 3  . insulin detemir (LEVEMIR FLEXTOUCH) 100 UNIT/ML FlexPen Inject 35 Units into the skin at bedtime.    . isosorbide mononitrate (IMDUR) 60 MG 24 hr  tablet Take 90 mg by mouth daily.    . metoprolol succinate (TOPROL-XL) 100 MG 24 hr tablet TAKE 1 AND 1/2 TABLETS BY MOUTH DAILY (Patient taking differently: Take 100 mg by mouth See admin instructions. Take one tablet (100 mg) by mouth with a 50 mg tablet for a total dose of 150 mg every evening) 30 tablet 3  . metoprolol succinate (TOPROL-XL) 50 MG 24 hr tablet Take 50 mg by mouth See admin instructions. Take one tablet (50 mg) by mouth with a 100 mg tablet for a total dose of 150 mg every evening    . nitroGLYCERIN (NITROSTAT) 0.4 MG SL tablet Place 0.4 mg under the tongue every 5 (five) minutes as needed for chest pain.     . pantoprazole (PROTONIX) 40 MG tablet Take 1 tablet (40 mg total) by mouth daily. 90 tablet 1  . traMADol (ULTRAM) 50 MG tablet Take 50 mg by mouth 3 (three) times daily as needed (pain).     . furosemide (LASIX) 20 MG tablet Take 2 tablets (40 mg total) by mouth daily. (Patient not taking: Reported on 05/31/2019) 180 tablet 1  . isosorbide mononitrate (IMDUR) 30 MG 24 hr tablet Take 3 tablets (90 mg total) by mouth daily. (Patient not taking: Reported on 05/31/2019) 30 tablet 3   For reference - Picture below is from 3/27     Physical Exam: Blood pressure (!) 119/57, pulse 76, temperature 97.6 F (36.4 C), temperature source Oral, resp. rate 14, height  (1.6 m), weight 83.9 kg, SpO2 98 %. General: pleasant, WD/WN white female who is laying in bed in NAD HEENT: head is normocephalic, atraumatic.  Sclera are noninjected.  PERRL.  Ears and nose without any masses or lesions.  Mouth is pink and moist. Dentition fair. No oral lesions Heart Irr. Irr.  No murmurs.  Palpable radial pulses bilaterally  Lungs: CTAB, no wheezes, rhonchi, or rales noted.  Respiratory effort nonlabored ZOX:WRUE, mild LUQ tenderness. Otherwise NT. ND, +BS, no masses, hernias, or organomegaly MS: 1+ edema to lower extremities b/l. Chronic venous stasis like changes to LLE with erythema and warmth.  Prior area of bullae that has drained on left medial calf is visualized. Left foot with area of sloughed skin and friable tissue as  seen in picture below. Right foot with large intact bullae as seen below. Prior right toe amputations noted.  Skin: warm and dry with no masses, lesions, or rashes Psych: A&Ox4 with an appropriate affect Neuro: cranial nerves grossly intact, equal strength in BUE/BLE bilaterally, normal speech, though process intact         Results for orders placed or performed during the hospital encounter of 05/31/19 (from the past 48 hour(s))  Blood culture (routine x 2)     Status: None (Preliminary result)   Collection Time: 05/31/19  5:18 PM   Specimen: BLOOD RIGHT ARM  Result Value Ref Range   Specimen Description BLOOD RIGHT ARM    Special Requests      BOTTLES DRAWN AEROBIC AND ANAEROBIC Blood Culture adequate volume   Culture      NO GROWTH < 12 HOURS Performed at [facility], [address], [place], [place] [address]    Report Status PENDING   Blood culture (routine x 2)     Status: None (Preliminary result)   Collection Time: 05/31/19  5:23 PM   Specimen: BLOOD RIGHT HAND  Result Value Ref Range   Specimen Description BLOOD RIGHT HAND    Special Requests      BOTTLES DRAWN AEROBIC AND ANAEROBIC Blood Culture results may not be optimal due to an inadequate volume of blood received in culture bottles   Culture      NO GROWTH < 12 HOURS Performed at [facility], [address] [place], [place] [address]    Report Status PENDING   CBC with Differential     Status: Abnormal   Collection Time: 05/31/19  5:43 PM  Result Value Ref Range   WBC 9.9 4.0 - 10.5 K/uL   RBC 4.09 3.87 - 5.11 MIL/uL   Hemoglobin 9.2 (L) 12.0 - 15.0 g/dL   HCT 03.1 (L) 59.4 - 58.5 %   MCV 75.6 (L) 80.0 - 100.0 fL   MCH 22.5 (L) 26.0 - 34.0 pg   MCHC 29.8 (L) 30.0 - 36.0 g/dL   RDW 92.9 (H) 24.4 - 62.8 %   Platelets 404 (H) 150 - 400 K/uL   nRBC 2.1 (H) 0.0 -  0.2 %   Neutrophils Relative % 74 %   Neutro Abs 7.3 1.7 - 7.7 K/uL   Lymphocytes Relative 15 %   Lymphs Abs 1.5 0.7 - 4.0 K/uL   Monocytes Relative 9 %   Monocytes Absolute 0.9 0.1 - 1.0 K/uL   Eosinophils Relative 2 %   Eosinophils Absolute 0.2 0.0 - 0.5 K/uL   Basophils Relative 0 %   Basophils Absolute 0.0 0.0 - 0.1 K/uL   Immature Granulocytes 0 %   Abs Immature Granulocytes 0.04 0.00 - 0.07 K/uL    Comment: Performed at [facility], [address], [place], [place] [address]  Comprehensive metabolic panel     Status: Abnormal   Collection Time: 05/31/19  5:43 PM  Result Value Ref Range   Sodium 133 (L) 135 - 145 mmol/L   Potassium 6.4 (HH) 3.5 - 5.1 mmol/L    Comment: SLIGHT HEMOLYSIS CRITICAL RESULT CALLED TO, READ BACK BY AND VERIFIED WITH: K.RAND RN [id number] 05/31/19 MCCORMICK K    Chloride 102 98 - 111 mmol/L   CO2 18 (L) 22 - 32 mmol/L   Glucose, Bld 148 (H) 70 - 99 mg/dL    Comment: Glucose reference range applies only to samples taken after fasting for  at least 8 hours.   BUN 82 (H) 8 - 23 mg/dL   Creatinine, Ser 9.51 (H) 0.44 - 1.00 mg/dL   Calcium 8.9 8.9 - 88.4 mg/dL   Total Protein 6.6 6.5 - 8.1 g/dL   Albumin 2.9 (L) 3.5 - 5.0 g/dL   AST 32 15 - 41 U/L   ALT 20 0 - 44 U/L   Alkaline Phosphatase 121 38 - 126 U/L   Total Bilirubin 1.2 0.3 - 1.2 mg/dL   GFR calc non Af Amer 11 (L) >60 mL/min   GFR calc Af Amer 12 (L) >60 mL/min   Anion gap 13 5 - 15    Comment: Performed at [facility], [address] [place], [place] [address]  Lactic acid, plasma     Status: Abnormal   Collection Time: 05/31/19  5:44 PM  Result Value Ref Range   Lactic Acid, Venous 2.6 (HH) 0.5 - 1.9 mmol/L    Comment: CRITICAL RESULT CALLED TO, READ BACK BY AND VERIFIED WITH: K.RAND RN [phone] 05/31/19 MCCORMICK K Performed at [facility], [address], [place], [place] [address]   Respiratory Panel by RT PCR (Flu A&B, Covid) - Nasopharyngeal Swab     Status: None    Collection Time: 05/31/19  6:45 PM   Specimen: Nasopharyngeal Swab  Result Value Ref Range   SARS Coronavirus 2 by RT PCR NEGATIVE NEGATIVE    Comment: (NOTE) SARS-CoV-2 target nucleic acids are NOT DETECTED. The SARS-CoV-2 RNA is generally detectable in upper respiratoy specimens during the acute phase of infection. The lowest concentration of SARS-CoV-2 viral copies this assay can detect is 131 copies/mL. A negative result does not preclude SARS-Cov-2 infection and should not be used as the sole basis for treatment or other patient management decisions. A negative result may occur with  improper specimen collection/handling, submission of specimen other than nasopharyngeal swab, presence of viral mutation(s) within the areas targeted by this assay, and inadequate number of viral copies (<131 copies/mL). A negative result must be combined with clinical observations, patient history, and epidemiological information. The expected result is Negative. Fact Sheet for Patients:  https://www.moore.com/ Fact Sheet for Healthcare Providers:  https://www.young.biz/ This test is not yet ap proved or cleared by the [place] FDA and  has been authorized for detection and/or diagnosis of SARS-CoV-2 by FDA under an Emergency Use Authorization (EUA). This EUA will remain  in effect (meaning this test can be used) for the duration of the COVID-19 declaration under Section 564(b)(1) of the Act, 21 U.S.C. section 360bbb-3(b)(1), unless the authorization is terminated or revoked sooner.    Influenza A by PCR NEGATIVE NEGATIVE   Influenza B by PCR NEGATIVE NEGATIVE    Comment: (NOTE) The Xpert Xpress SARS-CoV-2/FLU/RSV assay is intended as an aid in  the diagnosis of influenza from Nasopharyngeal swab specimens and  should not be used as a sole basis for treatment. Nasal washings and  aspirates are unacceptable for Xpert Xpress SARS-CoV-2/FLU/RSV   testing. Fact Sheet for Patients: https://www.moore.com/ Fact Sheet for Healthcare Providers: https://www.young.biz/ This test is not yet approved or cleared by the [place] FDA and  has been authorized for detection and/or diagnosis of SARS-CoV-2 by  FDA under an Emergency Use Authorization (EUA). This EUA will remain  in effect (meaning this test can be used) for the duration of the  Covid-19 declaration under Section 564(b)(1) of the Act, 21  U.S.C. section 360bbb-3(b)(1), unless the authorization is  terminated or  revoked. Performed at [facility], [address], [place], [place] [address]   Lactic acid, plasma     Status: None   Collection Time: 05/31/19  7:18 PM  Result Value Ref Range   Lactic Acid, Venous 1.9 0.5 - 1.9 mmol/L    Comment: Performed at [facility], [address], [place], [place] [address]  Magnesium     Status: None   Collection Time: 05/31/19  7:24 PM  Result Value Ref Range   Magnesium 2.1 1.7 - 2.4 mg/dL    Comment: Performed at [facility], [address], [place], [place] [address]  CBG monitoring, ED     Status: None   Collection Time: 05/31/19  9:10 PM  Result Value Ref Range   Glucose-Capillary 89 70 - 99 mg/dL    Comment: Glucose reference range applies only to samples taken after fasting for at least 8 hours.  Lactic acid, plasma     Status: Abnormal   Collection Time: 06/01/19  1:53 AM  Result Value Ref Range   Lactic Acid, Venous 2.2 (HH) 0.5 - 1.9 mmol/L    Comment: CRITICAL RESULT CALLED TO, READ BACK BY AND VERIFIED WITH: RN G ABEKANLE  06/01/19 BY S GEZAHEGN Performed at [facility], [address], [place], [place] [address]   CBC     Status: Abnormal   Collection Time: 06/01/19  1:53 AM  Result Value Ref Range   WBC 9.5 4.0 - 10.5 K/uL   RBC 3.85 (L) 3.87 - 5.11 MIL/uL   Hemoglobin 8.5 (L) 12.0 - 15.0 g/dL    Comment: Reticulocyte Hemoglobin testing may  be clinically indicated, consider ordering this additional test [id number]    HCT 28.9 (L) 36.0 - 46.0 %   MCV 75.1 (L) 80.0 - 100.0 fL   MCH 22.1 (L) 26.0 - 34.0 pg   MCHC 29.4 (L) 30.0 - 36.0 g/dL   RDW 86.5 (H) 78.4 - 69.6 %   Platelets 347 150 - 400 K/uL   nRBC 1.7 (H) 0.0 - 0.2 %    Comment: Performed at [facility], [address], [place], [place] [address]  Comprehensive metabolic panel     Status: Abnormal   Collection Time: 06/01/19  1:53 AM  Result Value Ref Range   Sodium 135 135 - 145 mmol/L   Potassium 5.1 3.5 - 5.1 mmol/L   Chloride 107 98 - 111 mmol/L   CO2 15 (L) 22 - 32 mmol/L   Glucose, Bld 78 70 - 99 mg/dL    Comment: Glucose reference range applies only to samples taken after fasting for at least 8 hours.   BUN 74 (H) 8 - 23 mg/dL   Creatinine, Ser 4.13 (H) 0.44 - 1.00 mg/dL   Calcium 8.0 (L) 8.9 - 10.3 mg/dL   Total Protein 5.7 (L) 6.5 - 8.1 g/dL   Albumin 2.5 (L) 3.5 - 5.0 g/dL   AST 29 15 - 41 U/L   ALT 17 0 - 44 U/L   Alkaline Phosphatase 96 38 - 126 U/L   Total Bilirubin 0.9 0.3 - 1.2 mg/dL   GFR calc non Af Amer 13 (L) >60 mL/min   GFR calc Af Amer 15 (L) >60 mL/min   Anion gap 13 5 - 15    Comment: Performed at [facility], [address] [place], [place] [address]  Glucose, capillary     Status: None   Collection Time:  06/01/19  3:47 AM  Result Value Ref Range   Glucose-Capillary 80 70 - 99 mg/dL    Comment: Glucose reference range applies only to samples taken after fasting for at least 8 hours.   Comment 1 Notify RN    Comment 2 Document in Chart   Glucose, capillary     Status: None   Collection Time: 06/01/19  7:58 AM  Result Value Ref Range   Glucose-Capillary 90 70 - 99 mg/dL    Comment: Glucose reference range applies only to samples taken after fasting for at least 8 hours.   No results found.  Anti-infectives (From admission, onward)   Start     Dose/Rate Route Frequency Ordered Stop   06/01/19 1800  ceFEPIme  (MAXIPIME) 2 g in sodium chloride 0.9 % 100 mL IVPB     2 g 200 mL/hr over 30 Minutes Intravenous Every 24 hours 05/31/19 1932     06/01/19 1115  linezolid (ZYVOX) IVPB 600 mg     600 mg 300 mL/hr over 60 Minutes Intravenous Every 12 hours 06/01/19 1028     05/31/19 1932  vancomycin variable dose per unstable renal function (pharmacist dosing)  Status:  Discontinued      Does not apply See admin instructions 05/31/19 1932 06/01/19 1028   05/31/19 1730  vancomycin (VANCOREADY) IVPB 1750 mg/350 mL     1,750 mg 175 mL/hr over 120 Minutes Intravenous  Once 05/31/19 1718 05/31/19 2046   05/31/19 1730  ceFEPIme (MAXIPIME) 2 g in sodium chloride 0.9 % 100 mL IVPB     2 g 200 mL/hr over 30 Minutes Intravenous  Once 05/31/19 1718 05/31/19 1822      Assessment/Plan HTN HLD DM2 CKD CAD PAD on Plavix prior CVA Hx CHF COPD  A fib on Eliquis  Ruptured and intact Bullae of the lower extremities with cellulitis skin changes of the LLE - Request for skin bx by [facility] - I have asked that they specify the location of where they would like the bx. We discussed we will not be able to bx on the foot.  - Given patient is on Plavix/Eliquis, will need to discuss with MD timing of bx.  Jacinto Halim, [facility] Surgery 06/01/2019, 10:38 AM Please see Amion for pager number during day hours 7:00am-4:30pm

## 2019-06-01 NOTE — Progress Notes (Signed)
CRITICAL VALUE ALERT  Critical Value:  Lactic Acid 2.2  Date & Time Notied:  06/01/19  0300  Provider Notified: Katherina Right with [facility]   Orders Received/Actions taken: No new orders received. Will continue to monitor.

## 2019-06-01 NOTE — Progress Notes (Signed)
Pt had very little urine output for the day. Bladder scan showed 796 mL of urine. RN paged Dr. Elvera Lennox for straight cath order. RN came back into room and pt had urinated using the purewick. Approximately 475 mL of urine was measured and charted. Dr. Elvera Lennox returned text page and ordered to continue with q8 hr bladder scans, but no catheterization for now. Will continue to monitor.

## 2019-06-01 NOTE — Evaluation (Signed)
Occupational Therapy Evaluation Patient Details Name: [name] [name] MRN: [id number] DOB: [birth year] Today's Date: 06/01/2019    History of Present Illness [name] [name] is a 69 y.o. female with a history of HTN, HLD, DM2, CKD, CAD, PAD on Plavix, prior CVA, CHF, COPD and A fib on Eliquis who we are asked to see for skin biopsy and sent to ED on 5/3 for wound check.   Clinical Impression   Pt admitted with above. She demonstrates the below listed deficits and will benefit from continued OT to maximize safety and independence with BADLs.  Pt seen in conjunction with PT.  She was only agreeable to EOB activity due to severity of Lt LE pain.  Pt requires mod A +2 for bed mobility and max A +2 to scoot up EOB.  She requires set up - total A for ADLs.  She reports her daughter has been staying with her and providing 24 hour assist and assisting with all ADLs, and that she sleeps in a lift chair, performs stand pivot transfer to [facility], and on occasion to her w/c.  She is adamant she does not want to go to SNF, but she has had multiple admissions.  IF she refuses SNF, recommend [facility] services.        Follow Up Recommendations  SNF;Supervision/Assistance - 24 hour    Equipment Recommendations  None recommended by OT    Recommendations for Other Services       Precautions / Restrictions Precautions Precautions: Fall Precaution Comments: bilat LE wounds in dressings Restrictions Weight Bearing Restrictions: No      Mobility Bed Mobility Overal bed mobility: Needs Assistance Bed Mobility: Supine to Sit;Sit to Supine     Supine to sit: Mod assist;+2 for physical assistance Sit to supine: Mod assist;+2 for physical assistance   General bed mobility comments: pt able to move LEs towards EOB, more difficulty with the L LE, increased time required, pt had to pull up on PT and OT had also provide truncal assist to sit at EOB  Transfers Overall transfer level: Needs assistance   Transfers:  Lateral/Scoot Transfers          Lateral/Scoot Transfers: Mod assist;+2 physical assistance General transfer comment: modAx2 to lateral scoot to [facility], pt refused to attempt to stand  due to 12/10 bilat LE pain    Balance Overall balance assessment: Needs assistance Sitting-balance support: Feet supported;Bilateral upper extremity supported Sitting balance-Leahy Scale: Good Sitting balance - Comments: pt able to maintain EOB sitting with min guard assist                                    ADL either performed or assessed with clinical judgement   ADL Overall ADL's : Needs assistance/impaired Eating/Feeding: Set up;Bed level   Grooming: Wash/dry hands;Wash/dry face;Oral care;Brushing hair;Min guard;Sitting   Upper Body Bathing: Moderate assistance;Sitting   Lower Body Bathing: Maximal assistance;Bed level   Upper Body Dressing : Maximal assistance;Sitting   Lower Body Dressing: Total assistance;Bed level   Toilet Transfer: Total assistance Toilet Transfer Details (indicate cue type and reason): unable  Toileting- Clothing Manipulation and Hygiene: Total assistance;Bed level       Functional mobility during ADLs: Moderate assistance(bed mobility )       Vision         Perception     Praxis      Pertinent Vitals/Pain Pain Assessment: 0-10 Pain Score:  10-Worst pain ever Pain Location: L LE at wound site Pain Descriptors / Indicators: Burning Pain Intervention(s): Monitored during session;Repositioned;Limited activity within patient's tolerance     Hand Dominance Right   Extremity/Trunk Assessment Upper Extremity Assessment Upper Extremity Assessment: LUE deficits/detail;Generalized weakness LUE Deficits / Details: Pt uses Lt UE as a gross assist. When asked about it, she says "it's useless"  I injureed it and it never got better, but she did not elaborate    Lower Extremity Assessment Lower Extremity Assessment: Defer to PT evaluation    Cervical / Trunk Assessment Cervical / Trunk Assessment: Normal   Communication Communication Communication: No difficulties   Cognition Arousal/Alertness: Awake/alert Behavior During Therapy: Anxious(in tears due to frustration) Overall Cognitive Status: Within Functional Limits for tasks assessed                                 General Comments: pt appears self limiting at baseline   General Comments  pt with R foot wound that was dressed and L LE wound that was dressed. Noted redness and errythema above dressing    Exercises     Shoulder Instructions      Home Living Family/patient expects to be discharged to:: Private residence Living Arrangements: Children Available Help at Discharge: Family;Available 24 hours/day Type of Home: House Home Access: Ramped entrance     Home Layout: One level     Bathroom Shower/Tub: Tub/shower unit         Home Equipment: Environmental consultant - 2 wheels;Bedside commode(lift chair)   Additional Comments: Pt states her daughter has taken a LOA to care for her       Prior Functioning/Environment Level of Independence: Needs assistance  Gait / Transfers Assistance Needed: pt stays in lift chair or w/c and is able to take "1-2" to the [facility] or w/c "sometimes", sponge baths with help of daughter ADL's / Homemaking Assistance Needed: daughter sponge baths patient, assists her with dressing, and  assist with dressing changes            OT Problem List: Decreased strength;Decreased activity tolerance;Impaired balance (sitting and/or standing);Decreased safety awareness;Decreased knowledge of use of DME or AE;Impaired UE functional use;Pain      OT Treatment/Interventions: Self-care/ADL training;Therapeutic exercise;DME and/or AE instruction;Therapeutic activities;Patient/family education;Balance training    OT Goals(Current goals can be found in the care plan section) Acute Rehab OT Goals Patient Stated Goal: home OT Goal  Formulation: With patient Time For Goal Achievement: 06/15/19 Potential to Achieve Goals: Good ADL Goals Pt Will Perform Grooming: with set-up;sitting Pt Will Perform Upper Body Bathing: with set-up;sitting Pt Will Perform Lower Body Bathing: with min assist;sit to/from stand;with adaptive equipment Pt Will Transfer to Toilet: with min assist;squat pivot transfer;bedside commode Pt Will Perform Toileting - Clothing Manipulation and hygiene: with min assist;sit to/from stand  OT Frequency: Min 2X/week   Barriers to D/C:            Co-evaluation PT/OT/SLP Co-Evaluation/Treatment: Yes Reason for Co-Treatment: Complexity of the patient's impairments (multi-system involvement);For patient/therapist safety;To address functional/ADL transfers   OT goals addressed during session: ADL's and self-care;Strengthening/ROM      AM-PAC OT "6 Clicks" Daily Activity     Outcome Measure Help from another person eating meals?: A Little Help from another person taking care of personal grooming?: A Little Help from another person toileting, which includes using toliet, bedpan, or urinal?: Total Help from another person bathing (including washing,  rinsing, drying)?: A Lot Help from another person to put on and taking off regular upper body clothing?: A Lot Help from another person to put on and taking off regular lower body clothing?: Total 6 Click Score: 12   End of Session Nurse Communication: Mobility status  Activity Tolerance: Patient limited by pain Patient left: in bed;with call bell/phone within reach;with bed alarm set  OT Visit Diagnosis: Unsteadiness on feet (R26.81);Pain;Muscle weakness (generalized) (M62.81) Pain - Right/Left: Left Pain - part of body: Leg                Time: 1610-9604 OT Time Calculation (min): 23 min Charges:  OT General Charges $OT Visit: 1 Visit OT Evaluation $OT Eval Moderate Complexity: 1 Mod  Eber Jones., OTR/L Acute Rehabilitation Services Pager  [phone] Office [phone]   Jeani Hawking M 06/01/2019, 3:06 PM

## 2019-06-01 NOTE — Consult Note (Signed)
WOC Nurse Consult Note: Patient receiving care in [facility] [phone]. Dr. Elvera Lennox to the bedside for BLE evaluation and my input into changes from last month. Reason for Consult: LLE venous stasis with cellulitis Wound type: LLE worsening etiology unclear at this time. Pressure Injury POA: Yes/No/NA Measurement: Basically the entire LLE is warm, erythematous, with heavy weeping, and skin sloughing off circumferentially.  See photos from earlier this year for comparison to this admission.  There are two distinct pink areas along the medial upper leg beginning as well, and to light pressure, the left thigh is tender.  The entire LLE is very painful to touch and there is edema present to BLE, L>R. Wound bed: red, skin sloughing. Drainage (amount, consistency, odor) serosanginous Periwound: as described Dressing procedure/placement/frequency:  Place multiple Mepitel silicone dressings [place] [phone]) over the left lower leg and foot sufficient to cover all "raw" areas.  Then top with multiple Aquacel [place] [phone]) OVER the mepitel. Wrap from the toes to below the knee with kerlex. Change the Aquacel and Kerlex daily and prn.  The Mepitel silicone dressings can stay in place up to 5 days.  There is also a large, intact, fluid filled bulla to the dorsum of the left foot that the patient states came up last Thursday.  For this area I have ordered a Mepitel silicone foam and roll gauze.  Also she had a "blister in her mouth" when she was discharged in April of this year.  No oral lesion observed today.  And, I have added Prevalon heel lift boots to her care plan.  Monitor the wound area(s) for worsening of condition such as: Signs/symptoms of infection,  Increase in size,  Development of or worsening of odor, Development of pain, or increased pain at the affected locations.  Notify the medical team if any of these develop.  Thank you for the consult.  Discussed plan of care with the patient and bedside nurse.   WOC nurse will not follow at this time.  Please re-consult the WOC team if needed.  Helmut Muster, RN, MSN, CWOCN, CNS-BC, pager [phone]

## 2019-06-01 NOTE — Evaluation (Signed)
Physical Therapy Evaluation Patient Details Name: [name] [name] MRN: [id number] DOB: 1950/07/31 Today's Date: 06/01/2019   History of Present Illness  [name] [name] is a 69 y.o. female with a history of HTN, HLD, DM2, CKD, CAD, PAD on Plavix, prior CVA, CHF, COPD and A fib on Eliquis who we are asked to see for skin biopsy and sent to ED on 5/3 for wound check.   Clinical Impression   Pt admitted with above. Pt has had 5 re-admits in the last 6 months, most recently d/c'd April 9th home with daughter. Pt has been either in her lift chair or in a w/c. Pt very sedentary at baseline due to bilat LE pain and chronic wounds. Pt to benefit from SNF upon d/c to address generalized weakness, poor endurance, impaired balance, and decreased functional mobility however pt adamantly refusing to go stating 'I'm going home with my daughter. She can stay with me. She's taking more leave."  Pt currently requiring modA for all mobility and is dependent on daughter for ADLs. Unsure how long daughter can be off from work (she works at [employer]). Acute PT to cont to follow.    Follow Up Recommendations SNF;Supervision/Assistance - 24 hour    Equipment Recommendations  (TBD at next venue)    Recommendations for Other Services       Precautions / Restrictions Precautions Precautions: Fall Precaution Comments: bilat LE wounds in dressings Restrictions Weight Bearing Restrictions: No      Mobility  Bed Mobility Overal bed mobility: Needs Assistance Bed Mobility: Supine to Sit;Sit to Supine     Supine to sit: Mod assist;+2 for physical assistance Sit to supine: Mod assist;+2 for physical assistance   General bed mobility comments: pt able to move LEs towards EOB, more difficulty with the L LE, increased time required, pt had to pull up on PT and OT had also provide truncal assist to sit at EOB  Transfers Overall transfer level: Needs assistance   Transfers: Lateral/Scoot Transfers          Lateral/Scoot Transfers: Mod assist;+2 physical assistance General transfer comment: modAx2 to lateral scoot to [facility], pt refused to attempt to stand  due to 12/10 bilat LE pain  Ambulation/Gait             General Gait Details: unable  Stairs            Wheelchair Mobility    Modified Rankin (Stroke Patients Only)       Balance Overall balance assessment: Needs assistance Sitting-balance support: Feet supported;Bilateral upper extremity supported Sitting balance-Leahy Scale: Good Sitting balance - Comments: pt able to maintain                                      Pertinent Vitals/Pain Pain Assessment: 0-10 Pain Score: 10-Worst pain ever Pain Location: L LE at wound site Pain Descriptors / Indicators: Burning Pain Intervention(s): Premedicated before session    Home Living Family/patient expects to be discharged to:: Private residence Living Arrangements: Children Available Help at Discharge: Family;Available 24 hours/day Type of Home: House Home Access: Ramped entrance     Home Layout: One level Home Equipment: Walker - 2 wheels;Bedside commode(lift chair)      Prior Function Level of Independence: Needs assistance   Gait / Transfers Assistance Needed: pt stays in lift chair or w/c, sponge baths with help of daughter  ADL's / Homemaking Assistance  Needed: daughter sponge baths patient, assist with dressing changes        Hand Dominance   Dominant Hand: Right    Extremity/Trunk Assessment   Upper Extremity Assessment Upper Extremity Assessment: Defer to OT evaluation(noted weaker L UE from previous broken arm)    Lower Extremity Assessment Lower Extremity Assessment: RLE deficits/detail;LLE deficits/detail RLE Deficits / Details: pt able to move R LE in the bed however generalized weakness grossly 3/5 LLE Deficits / Details: pt very limited by pain, grossly 3-/5, pt with large L LE wound causing pain and limiting ROM, unable to  complete LAQ at EOB    Cervical / Trunk Assessment Cervical / Trunk Assessment: Normal  Communication   Communication: No difficulties  Cognition Arousal/Alertness: Awake/alert Behavior During Therapy: (in tears due to frustration) Overall Cognitive Status: Within Functional Limits for tasks assessed                                 General Comments: pt appears self limiting at baseline      General Comments General comments (skin integrity, edema, etc.): pt with R foot wound that was dressed and L LE wound that was dressed. Noted redness and errythema above dressing    Exercises     Assessment/Plan    PT Assessment Patient needs continued PT services  PT Problem List Decreased strength;Decreased activity tolerance;Decreased balance;Decreased mobility;Decreased coordination;Decreased range of motion       PT Treatment Interventions DME instruction;Gait training;Functional mobility training;Therapeutic activities;Therapeutic exercise;Balance training    PT Goals (Current goals can be found in the Care Plan section)  Acute Rehab PT Goals Patient Stated Goal: home PT Goal Formulation: With patient Time For Goal Achievement: 06/15/19 Potential to Achieve Goals: Fair    Frequency Min 2X/week   Barriers to discharge Decreased caregiver support unsure how long daughter can be off work    Co-evaluation PT/OT/SLP Co-Evaluation/Treatment: Yes Reason for Co-Treatment: Complexity of the patient's impairments (multi-system involvement) PT goals addressed during session: Mobility/safety with mobility         AM-PAC PT "6 Clicks" Mobility  Outcome Measure Help needed turning from your back to your side while in a flat bed without using bedrails?: A Lot Help needed moving from lying on your back to sitting on the side of a flat bed without using bedrails?: A Lot Help needed moving to and from a bed to a chair (including a wheelchair)?: Total Help needed standing  up from a chair using your arms (e.g., wheelchair or bedside chair)?: Total Help needed to walk in hospital room?: Total Help needed climbing 3-5 steps with a railing? : Total 6 Click Score: 8    End of Session   Activity Tolerance: Patient limited by pain;Patient limited by fatigue Patient left: in bed;with call bell/phone within reach;with chair alarm set;with nursing/sitter in room Nurse Communication: Mobility status PT Visit Diagnosis: Difficulty in walking, not elsewhere classified (R26.2);Pain Pain - Right/Left: Left Pain - part of body: Leg    Time: 5573-2202 PT Time Calculation (min) (ACUTE ONLY): 23 min   Charges:   PT Evaluation $PT Eval Moderate Complexity: 1 Mod          Lewis Shock, PT, DPT Acute Rehabilitation Services Pager #: [phone] Office #: [phone]   Iona Hansen 06/01/2019, 12:58 PM

## 2019-06-02 DIAGNOSIS — I482 Chronic atrial fibrillation, unspecified: Secondary | ICD-10-CM | POA: Diagnosis not present

## 2019-06-02 DIAGNOSIS — T148XXA Other injury of unspecified body region, initial encounter: Secondary | ICD-10-CM

## 2019-06-02 DIAGNOSIS — I5022 Chronic systolic (congestive) heart failure: Secondary | ICD-10-CM

## 2019-06-02 DIAGNOSIS — L03116 Cellulitis of left lower limb: Principal | ICD-10-CM

## 2019-06-02 DIAGNOSIS — N179 Acute kidney failure, unspecified: Secondary | ICD-10-CM

## 2019-06-02 DIAGNOSIS — N189 Chronic kidney disease, unspecified: Secondary | ICD-10-CM

## 2019-06-02 DIAGNOSIS — N1832 Chronic kidney disease, stage 3b: Secondary | ICD-10-CM

## 2019-06-02 DIAGNOSIS — L089 Local infection of the skin and subcutaneous tissue, unspecified: Secondary | ICD-10-CM

## 2019-06-02 LAB — COMPREHENSIVE METABOLIC PANEL
ALT: 15 U/L (ref 0–44)
AST: 24 U/L (ref 15–41)
Albumin: 2.7 g/dL — ABNORMAL LOW (ref 3.5–5.0)
Alkaline Phosphatase: 99 U/L (ref 38–126)
Anion gap: 14 (ref 5–15)
BUN: 71 mg/dL — ABNORMAL HIGH (ref 8–23)
CO2: 14 mmol/L — ABNORMAL LOW (ref 22–32)
Calcium: 8.9 mg/dL (ref 8.9–10.3)
Chloride: 105 mmol/L (ref 98–111)
Creatinine, Ser: 2.98 mg/dL — ABNORMAL HIGH (ref 0.44–1.00)
GFR calc Af Amer: 18 mL/min — ABNORMAL LOW (ref >60)
GFR calc non Af Amer: 15 mL/min — ABNORMAL LOW (ref >60)
Glucose, Bld: 148 mg/dL — ABNORMAL HIGH (ref 70–99)
Potassium: 4.9 mmol/L (ref 3.5–5.1)
Sodium: 133 mmol/L — ABNORMAL LOW (ref 135–145)
Total Bilirubin: 0.8 mg/dL (ref 0.3–1.2)
Total Protein: 6.3 g/dL — ABNORMAL LOW (ref 6.5–8.1)

## 2019-06-02 LAB — GLUCOSE, CAPILLARY
Glucose-Capillary: 119 mg/dL — ABNORMAL HIGH (ref 70–99)
Glucose-Capillary: 127 mg/dL — ABNORMAL HIGH (ref 70–99)
Glucose-Capillary: 138 mg/dL — ABNORMAL HIGH (ref 70–99)
Glucose-Capillary: 141 mg/dL — ABNORMAL HIGH (ref 70–99)
Glucose-Capillary: 151 mg/dL — ABNORMAL HIGH (ref 70–99)
Glucose-Capillary: 158 mg/dL — ABNORMAL HIGH (ref 70–99)
Glucose-Capillary: 165 mg/dL — ABNORMAL HIGH (ref 70–99)

## 2019-06-02 LAB — CBC
HCT: 28.9 % — ABNORMAL LOW (ref 36.0–46.0)
Hemoglobin: 8.5 g/dL — ABNORMAL LOW (ref 12.0–15.0)
MCH: 21.9 pg — ABNORMAL LOW (ref 26.0–34.0)
MCHC: 29.4 g/dL — ABNORMAL LOW (ref 30.0–36.0)
MCV: 74.5 fL — ABNORMAL LOW (ref 80.0–100.0)
Platelets: 301 10*3/uL (ref 150–400)
RBC: 3.88 MIL/uL (ref 3.87–5.11)
RDW: 19.7 % — ABNORMAL HIGH (ref 11.5–15.5)
WBC: 9.2 10*3/uL (ref 4.0–10.5)
nRBC: 1.4 % — ABNORMAL HIGH (ref 0.0–0.2)

## 2019-06-02 LAB — PHOSPHORUS: Phosphorus: 4.9 mg/dL — ABNORMAL HIGH (ref 2.5–4.6)

## 2019-06-02 LAB — MAGNESIUM: Magnesium: 2.1 mg/dL (ref 1.7–2.4)

## 2019-06-02 MED ORDER — ALUM & MAG HYDROXIDE-SIMETH 200-200-20 MG/5ML PO SUSP: 15.0000 mL | Freq: Four times a day (QID) | ORAL | Status: DC | PRN

## 2019-06-02 MED ORDER — ONDANSETRON HCL 4 MG/2ML IJ SOLN
4.0000 mg | Freq: Four times a day (QID) | INTRAMUSCULAR | Status: DC
  Administered 2019-06-02 – 2019-06-05 (×13): 4 mg via INTRAVENOUS
  Filled 2019-06-02 (×13): qty 2

## 2019-06-02 MED ORDER — PANTOPRAZOLE SODIUM 40 MG IV SOLR
40.0000 mg | Freq: Two times a day (BID) | INTRAVENOUS | Status: DC
  Administered 2019-06-02 – 2019-06-03 (×2): 40 mg via INTRAVENOUS
  Filled 2019-06-02 (×2): qty 40

## 2019-06-02 MED ORDER — LIDOCAINE HCL 1 % IJ SOLN
5.0000 mL | Freq: Once | INTRAMUSCULAR | Status: AC
  Administered 2019-06-02: 5 mL
  Filled 2019-06-02: qty 5

## 2019-06-02 MED ORDER — METOCLOPRAMIDE HCL 5 MG/ML IJ SOLN: 10.0000 mg | Freq: Four times a day (QID) | INTRAMUSCULAR | Status: DC | PRN

## 2019-06-02 MED ORDER — LIDOCAINE HCL (PF) 1 % IJ SOLN
INTRAMUSCULAR | Status: AC
  Filled 2019-06-02: qty 5

## 2019-06-02 NOTE — TOC Initial Note (Signed)
Transition of Care [facility]) - Initial/Assessment Note    Patient Details  Name: [name] [name] MRN: [id number] Date of Birth: 1951-01-23  Transition of Care [facility]) CM/SW Contact:    Emeterio Reeve, [place] Phone Number: 06/02/2019, 12:59 PM  Clinical Narrative:                 CSW met with pt at bedside. CSW introduced self and explained her role at the hospital. Pt stated PTA she was dependent on her adult daughter. Pt lives at home with  Her daughter, [name] [name]. Pt stated she could only take a few steps at a time and mostly stayed in her bed or chair. Pt stated her daughter provided help with all ADL's. Pt states she uses a wheelchair.   CSW and pt discussed PT/OT recommendations of SNF placement. Pt stated that she did not want to go to a SNF and that she wanted to go home with her daughter. CSW informed pt that that if she goes home she will need 24 hour supervision. Pt stated that her daughter is currently taken leave from work but is unsure how much more time she can take off. Pt stated CSW can call daughter to discuss care.   PTs daughter [name] [name] stated that she is on leave from work but needs to return to work because she does not have any income coming in. [name] [name] stated that she feels like her mom would benefit from SNF. [name] [name] stated pt has been to the hospital 5 times and the last 6 months and her ability to take care of herself  Has decreased with each admission.[name] [name] stated that she has been to [facility] 2x and she would like for her to go back if able. [name] [name] is OK with CSW sending information out to facilities in the area. [name] [name] stated she will talk to her mom about SNF placement.   CSW will continue to follow.   Expected Discharge Plan: Skilled Nursing Facility Barriers to Discharge: Continued Medical Work up   Patient Goals and CMS Choice Patient states their goals for this hospitalization and ongoing recovery are:: to be independent at home CMS Medicare.gov Compare Post Acute Care  list provided to:: Patient Choice offered to / list presented to : Patient, Adult Children  Expected Discharge Plan and Services Expected Discharge Plan: [place] arrangements for the past 2 months: Single Family Home                                      Prior Living Arrangements/Services Living arrangements for the past 2 months: Single Family Home Lives with:: Adult Children Patient language and need for interpreter reviewed:: Yes Do you feel safe going back to the place where you live?: Yes      Need for Family Participation in Patient Care: Yes (Comment) Care giver support system in place?: Yes (comment)   Criminal Activity/Legal Involvement Pertinent to Current Situation/Hospitalization: No - Comment as needed  Activities of Daily Living Home Assistive Devices/Equipment: Wheelchair ADL Screening (condition at time of admission) Patient's cognitive ability adequate to safely complete daily activities?: Yes Is the patient deaf or have difficulty hearing?: No Does the patient have difficulty seeing, even when wearing glasses/contacts?: No Does the patient have difficulty concentrating, remembering, or making decisions?: No Patient able to express need for assistance with ADLs?: Yes Does the patient  have difficulty dressing or bathing?: Yes Independently performs ADLs?: Yes (appropriate for developmental age) Does the patient have difficulty walking or climbing stairs?: Yes Weakness of Legs: Both Weakness of Arms/Hands: None  Permission Sought/Granted Permission sought to share information with : Family Supports Permission granted to share information with : Yes, Verbal Permission Granted  Share Information with NAME: Dorna Bloom  Permission granted to share info w AGENCY: SNF  Permission granted to share info w Relationship: Daughter  Permission granted to share info w Contact Information: [phone]  Emotional  Assessment Appearance:: Appears stated age Attitude/Demeanor/Rapport: Engaged Affect (typically observed): Appropriate Orientation: : Oriented to Self, Oriented to Place, Oriented to  Time, Oriented to Situation Alcohol / Substance Use: Not Applicable Psych Involvement: No (comment)  Admission diagnosis:  Hyperkalemia [E87.5] Wound infection [T14.8XXA, L08.9] AKI (acute kidney injury) ([place]) [N17.9] Venous stasis dermatitis of left lower extremity [I87.2] Patient Active Problem List   Diagnosis Date Noted  . Wound infection 05/31/2019  . Acute kidney injury superimposed on CKD ([place]) 05/04/2019  . Nausea vomiting and diarrhea 05/04/2019  . CHF exacerbation ([place]) 04/24/2019  . Chronic a-fib ([place]) 04/24/2019  . Cellulitis 04/24/2019  . Vertigo 03/25/2019  . Atrial fibrillation with RVR ([place]) 03/20/2019  . Hyperkalemia   . Dyspnea 03/11/2019  . Elevated troponin 03/11/2019  . Chest pain 03/11/2019  . Acute on chronic combined systolic and diastolic CHF (congestive heart failure) ([place]) 05/08/2018  . Renal insufficiency 02/13/2018  . Stage 3b chronic kidney disease 02/13/2018  . Acute systolic CHF (congestive heart failure) ([place]) 02/13/2018  . Benign hypertension 11/26/2017  . GERD (gastroesophageal reflux disease) 11/01/2016  . Midfoot ulcer, left, limited to breakdown of skin ([place]) 01/08/2016  . Thigh hematoma 12/22/2015  . History of blood transfusion 12/22/2015  . Acute on chronic systolic CHF (congestive heart failure) ([place]) 12/22/2015  . H/O skin graft 12/11/2015  . SEMI (subendocardial myocardial infarction) ([place]) 11/19/2015  . DM type 2 with diabetic peripheral neuropathy ([place]) 11/19/2015  . Traumatic hemorrhagic shock ([place])   . Chronic systolic CHF (congestive heart failure) ([place])   . Uncontrolled type 2 diabetes mellitus with complication ([place])   . Demand myocardial infarction ([place]) 11/08/2015  . Surgery, elective   . Central line infection, initial encounter   .  Septic shock ([place]) 11/03/2015  . Encephalopathy acute 11/03/2015  . Acute blood loss as cause of postoperative anemia 11/03/2015  . Acute respiratory failure with hypoxia ([place])   . CVA (cerebral vascular accident) ([place])   . S/P femoral-popliteal bypass surgery   . Malnutrition of moderate degree 11/01/2015  . Diabetic ulcer of left midfoot associated with diabetes mellitus due to underlying condition, with muscle involvement without evidence of necrosis ([place])   . Hypertensive heart disease with chronic diastolic congestive heart failure ([place]) 10/31/2015  . Osteomyelitis of left foot ([place]) 10/30/2015  . PVD (peripheral vascular disease) with claudication ([place]) 09/28/2015  . Critical lower limb ischemia 08/28/2015  . Hyperlipidemia with target LDL less than 70 06/28/2013  . Diabetes mellitus type II, uncontrolled ([place]) 03/03/2013  . Complicated migraine 60/73/7106  . Stroke-like symptom 03/02/2013  . Diabetes mellitus ([place]) 03/02/2013  . Aphasia 03/02/2013  . Headache(784.0) 03/02/2013  . PAD (peripheral artery disease) ([place]) 06/05/2011  . Coronary artery disease involving native coronary artery of native heart without angina pectoris 06/05/2011  . Hypotension 06/05/2011  . Claudication in peripheral vascular disease:  Lifestyle limiting. 06/05/2011  . Hx of tobacco use, presenting hazards to health 06/05/2011  PCP:  Wenda Low, MD Pharmacy:   [id number] [place], [place] [id number]: [phone]: [phone]    Social Determinants of Health (SDOH) Interventions    Readmission Risk Interventions Readmission Risk Prevention Plan 05/06/2019  Transportation Screening Complete  Medication Review ([employer] Complete  HRI or [place]  SW Recovery Care/Counseling Consult Complete  Palliative Care Screening Not [place]villeComplete  Some recent data might be hidden   MEmeterio Reeve LLatanya Presser LNaugatuckSocial Worker [phone]

## 2019-06-02 NOTE — Progress Notes (Signed)
Pt was c/o confusion and dizziness. Pt vomited 50 ml of greenish bile, gave Zofran to help. Notified Dr.Alekh, came to bedside and made plan to do continuous antiemetic medications.   Denver Faster, RN

## 2019-06-02 NOTE — Progress Notes (Signed)
Pt has not voided so far on this shift.Pt was bladder scanned and had of urine in bladder. Notified Dr.Alekh, no new orders at the moment.   Denver Faster, RN

## 2019-06-02 NOTE — Progress Notes (Signed)
PT Cancellation Note  Patient Details Name: [name] [name] MRN: [id number] DOB: Mar 08, 1950   Cancelled Treatment:    Reason Eval/Treat Not Completed: Pain limiting ability to participate.  Pt had LE biopsy today and does not feel up to any attempts at mobility (even edge of bed).  She did relay to me that her most recent baseline is taking 1-2 pivotal steps from her lift chair to her bedside commode or wheel chair with her daughter's assistance.  She has not been out of the house in over a month (since her last hospitalization) and was active with home PT/OT, RN and aide.  Her preference is to return home and is aware she may need ambulance transport to do so safely.  She states her daughter is on board with her plan, but is not present to confirm.  RN case manager, [name] [name] was made aware of the above information.  PT will continue to strongly recommend SNF for rehab.  PT to check back tomorrow to continue to encourage mobility.   Thanks,  Corinna Capra, PT, DPT  Acute Rehabilitation [phone] pager #[phone] office       [name] [name] 06/02/2019, 12:45 PM

## 2019-06-02 NOTE — Discharge Instructions (Signed)

## 2019-06-02 NOTE — NC FL2 (Signed)
[place] MEDICAID FL2 LEVEL OF CARE SCREENING TOOL     IDENTIFICATION  Patient Name: [name] [name] Birthdate: 07-13-1950 Sex: female Admission Date (Current Location): 05/31/2019  [facility] and [place] Number:  Producer, television/film/video and Address:  [facility], [address] [place], [place] [address]      Provider Number: [id number]  Attending Physician Name and Address:  Glade Lloyd, MD  Relative Name and Phone Number:       Current Level of Care: Hospital Recommended Level of Care: Skilled Nursing Facility Prior Approval Number:    Date Approved/Denied:   PASRR Number: [id number]  Discharge Plan: SNF    Current Diagnoses: Patient Active Problem List   Diagnosis Date Noted  . Wound infection 05/31/2019  . Acute kidney injury superimposed on CKD (HCC) 05/04/2019  . Nausea vomiting and diarrhea 05/04/2019  . CHF exacerbation (HCC) 04/24/2019  . Chronic a-fib (HCC) 04/24/2019  . Cellulitis 04/24/2019  . Vertigo 03/25/2019  . Atrial fibrillation with RVR (HCC) 03/20/2019  . Hyperkalemia   . Dyspnea 03/11/2019  . Elevated troponin 03/11/2019  . Chest pain 03/11/2019  . Acute on chronic combined systolic and diastolic CHF (congestive heart failure) (HCC) 05/08/2018  . Renal insufficiency 02/13/2018  . Stage 3b chronic kidney disease 02/13/2018  . Acute systolic CHF (congestive heart failure) (HCC) 02/13/2018  . Benign hypertension 11/26/2017  . GERD (gastroesophageal reflux disease) 11/01/2016  . Midfoot ulcer, left, limited to breakdown of skin (HCC) 01/08/2016  . Thigh hematoma 12/22/2015  . History of blood transfusion 12/22/2015  . Acute on chronic systolic CHF (congestive heart failure) (HCC) 12/22/2015  . H/O skin graft 12/11/2015  . SEMI (subendocardial myocardial infarction) (HCC) 11/19/2015  . DM type 2 with diabetic peripheral neuropathy (HCC) 11/19/2015  . Traumatic hemorrhagic shock (HCC)   . Chronic systolic CHF (congestive  heart failure) (HCC)   . Uncontrolled type 2 diabetes mellitus with complication (HCC)   . Demand myocardial infarction (HCC) 11/08/2015  . Surgery, elective   . Central line infection, initial encounter   . Septic shock (HCC) 11/03/2015  . Encephalopathy acute 11/03/2015  . Acute blood loss as cause of postoperative anemia 11/03/2015  . Acute respiratory failure with hypoxia (HCC)   . CVA (cerebral vascular accident) (HCC)   . S/P femoral-popliteal bypass surgery   . Malnutrition of moderate degree 11/01/2015  . Diabetic ulcer of left midfoot associated with diabetes mellitus due to underlying condition, with muscle involvement without evidence of necrosis (HCC)   . Hypertensive heart disease with chronic diastolic congestive heart failure (HCC) 10/31/2015  . Osteomyelitis of left foot (HCC) 10/30/2015  . PVD (peripheral vascular disease) with claudication (HCC) 09/28/2015  . Critical lower limb ischemia 08/28/2015  . Hyperlipidemia with target LDL less than 70 06/28/2013  . Diabetes mellitus type II, uncontrolled (HCC) 03/03/2013  . Complicated migraine 03/03/2013  . Stroke-like symptom 03/02/2013  . Diabetes mellitus (HCC) 03/02/2013  . Aphasia 03/02/2013  . Headache(784.0) 03/02/2013  . PAD (peripheral artery disease) (HCC) 06/05/2011  . Coronary artery disease involving native coronary artery of native heart without angina pectoris 06/05/2011  . Hypotension 06/05/2011  . Claudication in peripheral vascular disease:  Lifestyle limiting. 06/05/2011  . Hx of tobacco use, presenting hazards to health 06/05/2011    Orientation RESPIRATION BLADDER Height & Weight     Self, Time, Situation    Incontinent Weight: 185 lb (83.9 kg) Height:  5\' 3"  (160 cm)  BEHAVIORAL SYMPTOMS/MOOD NEUROLOGICAL BOWEL  NUTRITION STATUS      Incontinent Diet(see discharge summary)  AMBULATORY STATUS COMMUNICATION OF NEEDS Skin   Extensive Assist Verbally Other (Comment)(serous blister on foot,  cellulitis on leg w/ gauze, Weeping on leg w/ gauze)                       Personal Care Assistance Level of Assistance  Bathing, Feeding, Dressing Bathing Assistance: Maximum assistance Feeding assistance: Limited assistance Dressing Assistance: Maximum assistance     Functional Limitations Info  Sight, Hearing, Speech Sight Info: Adequate Hearing Info: Adequate Speech Info: Adequate    SPECIAL CARE FACTORS FREQUENCY  PT (By licensed PT), OT (By licensed OT)     PT Frequency: 5x a week OT Frequency: 5x a week            Contractures Contractures Info: Present    Additional Factors Info  Code Status, Allergies Code Status Info: Full Allergies Info: Bactrim, Doxycycline, Hydrochlorothiazide, Latex, and Penicillins           Current Medications (06/02/2019):  This is the current hospital active medication list Current Facility-Administered Medications  Medication Dose Route Frequency Provider Last Rate Last Admin  . acetaminophen (TYLENOL) tablet 650 mg  650 mg Oral Q6H PRN Fair, Chelsea N, MD      . albuterol (PROVENTIL) (2.5 MG/3ML) 0.083% nebulizer solution 3 mL  3 mL Inhalation Q6H PRN Fair, Marin Shutter, MD      . alum & mag hydroxide-simeth (MAALOX/MYLANTA) 200-200-20 MG/5ML suspension 15 mL  15 mL Oral Q6H PRN Alekh, Kshitiz, MD      . apixaban (ELIQUIS) tablet 5 mg  5 mg Oral BID Caren Griffins, MD      . atorvastatin (LIPITOR) tablet 80 mg  80 mg Oral QHS Chauncey Mann, MD   80 mg at 06/01/19 2158  . ceFEPIme (MAXIPIME) 2 g in sodium chloride 0.9 % 100 mL IVPB  2 g Intravenous Q24H Duanne Limerick, RPH 200 mL/hr at 06/01/19 1802 2 g at 06/01/19 1802  . clopidogrel (PLAVIX) tablet 75 mg  75 mg Oral Daily Fair, Marin Shutter, MD   75 mg at 06/02/19 0956  . furosemide (LASIX) injection 40 mg  40 mg Intravenous BID Caren Griffins, MD   40 mg at 06/02/19 0819  . HYDROmorphone (DILAUDID) injection 0.5 mg  0.5 mg Intravenous Q4H PRN Caren Griffins, MD   0.5 mg at  06/02/19 0433  . insulin aspart (novoLOG) injection 0-5 Units  0-5 Units Subcutaneous QHS Fair, Chelsea N, MD      . insulin aspart (novoLOG) injection 0-9 Units  0-9 Units Subcutaneous TID WC Chauncey Mann, MD   2 Units at 06/02/19 1148  . isosorbide mononitrate (IMDUR) 24 hr tablet 90 mg  90 mg Oral Daily Fair, Marin Shutter, MD   90 mg at 06/02/19 0957  . linezolid (ZYVOX) IVPB 600 mg  600 mg Intravenous Q12H Caren Griffins, MD 300 mL/hr at 06/02/19 0825 600 mg at 06/02/19 0825  . metoCLOPramide (REGLAN) injection 10 mg  10 mg Intravenous Q6H PRN Alekh, Kshitiz, MD      . metoprolol succinate (TOPROL-XL) 24 hr tablet 100 mg  100 mg Oral Daily Fair, Marin Shutter, MD   100 mg at 06/02/19 0956  . ondansetron (ZOFRAN) injection 4 mg  4 mg Intravenous Q6H Alekh, Kshitiz, MD   4 mg at 06/02/19 1153  . oxyCODONE (Oxy IR/ROXICODONE) immediate release tablet 5 mg  5  mg Oral Q4H PRN Leatha Gilding, MD   5 mg at 06/01/19 2158  . pantoprazole (PROTONIX) injection 40 mg  40 mg Intravenous Q12H Alekh, Kshitiz, MD      . traMADol (ULTRAM) tablet 50 mg  50 mg Oral Q12H PRN Joselyn Arrow, MD   50 mg at 06/01/19 0349     Discharge Medications: Please see discharge summary for a list of discharge medications.  Relevant Imaging Results:  Relevant Lab Results:   Additional Information ZP#[id number]  Jimmy Picket, [place]

## 2019-06-02 NOTE — Procedures (Signed)
Skin Biopsy Procedure Note  Pre-operative Diagnosis: Rash  Post-operative Diagnosis: same  Indications: EMA HEBNER is a 69 y.o. female with a history of HTN, HLD, DM2, CKD, CAD, PAD on Plavix, prior CVA, CHF, COPD and A fib on Eliquis who we are asked to see for skin biopsy.  Patient reports that she has been dealing with blisters on her lower extremities since March.  It appears she first presented to the hospital on 3/27 with a 2-day history of blister on her lower extremities with bilateral lower extremity edema.  She was treated with IV antibiotics and Lasix.  She was discharged on 3/30.  She reports after discharge the bulla on her left lower extremity drained at home.  She has been using unna boots at home for this. She has been following with outpatient clinic for managament. Was sent to ED on 5/3 for wound check.  She was admitted to the hospitalist service.  We are asked to see for skin biopsy as patient states that she had a bullous lesion inside her mouth during sometime in April. There is concern for possible autoimmune cause of skin lesions. I have reached out Dr. Hanley Ben to see if there is any particular location for bx. He requested any location where inflammation is present. I reached out to pathology to see what this specimen would need to be placed in. They requested NS rather than formalin.   Anesthesia: 1% plain lidocaine  Procedure Details  The procedure, risks and complications have been discussed in detail (including, but not limited to infection and bleeding) with the patient, and the patient has signed consent to the procedure.  The skin was sterilely prepped and draped over the affected area in the usual fashion. After adequate local anesthesia, a skin biopsy was performed over the lateral border of prior bullous lesion and healthy tissue. This was placed in normal saline per pathologies request. Hemostasis was acheieved with a single 4-0 absorbable simple interuppted  vicryl stitch, surgicel and a pressure dressing. The patient was observed until stable.  Specimen: Skin biopsy to left medial leg  EBL: 2 cc's  Drains: None  Condition: Tolerated procedure well   Complications: none.  Leary Roca, [facility]

## 2019-06-02 NOTE — Progress Notes (Signed)
Patient ID: [name] [name] Age, female   DOB: 1950-05-15, 69 y.o.   MRN: [id number]  PROGRESS NOTE    [name] [name]  PPI:[id number] DOB: 31-Dec-1950 DOA: 05/31/2019 PCP: [name] Housekeeper, MD   Brief Narrative:  69 year old female with history of DM2, HTN, chronic kidney disease, coronary artery disease, PVD, chronic combined CHF, prior CVA, hyperlipidemia, A. fib on Eliquis, COPD presented with left leg wound infection/cellulitis, hyperkalemia and acute kidney injury.  Patient has had worsening pain and redness of the left lower extremity for the last 3 days.  She was on Bactrim as an outpatient but she stopped last week due to nausea and vomiting.  She was admitted on vancomycin and cefepime.  Assessment & Plan:   Left lower extremity cellulitis in the setting of chronic venous insufficiency and chronic edema -Continue zyvox and cefepime.  Cultures negative so far.  She appears to have failed Bactrim as an outpatient -Wound care evaluation appreciated  Bullous skin lesions -these can definitely be caused by acute on chronic lower extremity swelling however it would not explain the lesion that she noticed in her mouth last time she was in the hospital.  Need to rule out autoimmune skin conditions -general surgery is planning for a skin biopsy today.  Appreciate input.  -Might need outpatient dermatology follow-up  Acute kidney injury on chronic renal disease stage IIIb -Baseline creatinine 1.7 in March to 121 but recently around 2.5 -Creatinine 4.02 on admission; treated with IV fluids and subsequently discontinued -Creatinine 2.98 today.  Monitor  Acute on chronic combined CHF -Most recent 2D echo in February 2 on 21 showed EF of 25 to 30% with grade 3 diastolic dysfunction -Continue Lasix.  Strict input and output.  Daily weights.  Fluid restriction -Negative balance of 1825 cc since admission  Intermittent nausea and vomiting -Patient complains of nausea since yesterday with vomiting this  morning.  Will switch to around-the-clock Zofran.  Check LFTs in a.m.  Coronary artery disease/PAD -no chest pain, no concern for ACS.  Continue Plavix, Imdur  Type 2 diabetes mellitus -her most recent A1c was 7.1, her Levemir has been held on admission due to low CBGs, placed on sliding scale.  -Continue CBGs with SSI  Permanent A. Fib -continue metoprolol for rate control; Eliquis held for skin biopsy today.  Resume after biopsy later today.  Hyperlipidemia -continue atorvastatin  Essential hypertension -continue metoprolol, she was started on a lower dose than home due to heart rate into the 60s in the ED  Generalized deconditioning -PT recommends SNF placement.  Will consult social worker   DVT prophylaxis: Eliquis on hold Code Status: Full Family Communication: Patient at bedside Disposition Plan: Status is: Inpatient  Remains inpatient appropriate because:IV treatments appropriate due to intensity of illness or inability to take PO   Dispo: The patient is from: Home              Anticipated d/c is to: SNF              Anticipated d/c date is: 3 days              Patient currently is not medically stable to d/c.   Consultants: General surgery Procedures: None  Antimicrobials:  Anti-infectives (From admission, onward)   Start     Dose/Rate Route Frequency Ordered Stop   06/01/19 1800  ceFEPIme (MAXIPIME) 2 g in sodium chloride 0.9 % 100 mL IVPB     2 g 200 mL/hr over 30 Minutes Intravenous  Every 24 hours 05/31/19 1932     06/01/19 1115  linezolid (ZYVOX) IVPB 600 mg     600 mg 300 mL/hr over 60 Minutes Intravenous Every 12 hours 06/01/19 1028     05/31/19 1932  vancomycin variable dose per unstable renal function (pharmacist dosing)  Status:  Discontinued      Does not apply See admin instructions 05/31/19 1932 06/01/19 1028   05/31/19 1730  vancomycin (VANCOREADY) IVPB 1750 mg/350 mL     1,750 mg 175 mL/hr over 120 Minutes Intravenous  Once 05/31/19 1718  05/31/19 2046   05/31/19 1730  ceFEPIme (MAXIPIME) 2 g in sodium chloride 0.9 % 100 mL IVPB     2 g 200 mL/hr over 30 Minutes Intravenous  Once 05/31/19 1718 05/31/19 1822       Subjective: Patient seen and examined at bedside.  She does not feel well.  Complains of nausea.  Had one episode of vomiting this morning.  No overnight fever, shortness of breath or chest pain reported.  Objective: Vitals:   06/01/19 1536 06/01/19 1932 06/02/19 0340 06/02/19 0753  BP: 113/90 93/82  126/61  Pulse: 68 94  72  Resp: (!) 21 20  13   Temp: (!) 97.3 F (36.3 C) (!) 97.5 F (36.4 C) (!) 97.5 F (36.4 C) (!) 97.4 F (36.3 C)  TempSrc: Axillary Oral Oral Oral  SpO2:  98%  97%  Weight:      Height:        Intake/Output Summary (Last 24 hours) at 06/02/2019 1120 Last data filed at 06/02/2019 0815 Gross per 24 hour  Intake 300 ml  Output 2175 ml  Net -1875 ml   Filed Weights   05/31/19 1703  Weight: 83.9 kg    Examination:  General exam: Looks chronically ill.  Lying in bed. Respiratory system: Bilateral decreased breath sounds at bases with some scattered crackles Cardiovascular system: S1 & S2 heard, Rate controlled Gastrointestinal system: Abdomen is nondistended, soft and nontender. Normal bowel sounds heard. Extremities: No cyanosis, clubbing; trace lower extremity edema present.  Left foot dressing present.  Right lower extremity dressing present. Central nervous system: Alert and oriented. No focal neurological deficits. Moving extremities Skin: No other lesions or ulcers  psychiatry: Looks anxious    Data Reviewed: I have personally reviewed following labs and imaging studies  CBC: Recent Labs  Lab 05/31/19 1743 06/01/19 0153 06/02/19 0200  WBC 9.9 9.5 9.2  NEUTROABS 7.3  --   --   HGB 9.2* 8.5* 8.5*  HCT 30.9* 28.9* 28.9*  MCV 75.6* 75.1* 74.5*  PLT 404* 347 726   Basic Metabolic Panel: Recent Labs  Lab 05/31/19 1743 05/31/19 1924 06/01/19 0153 06/02/19 0200   NA 133*  --  135 133*  K 6.4*  --  5.1 4.9  CL 102  --  107 105  CO2 18*  --  15* 14*  GLUCOSE 148*  --  78 148*  BUN 82*  --  74* 71*  CREATININE 4.02*  --  3.37* 2.98*  CALCIUM 8.9  --  8.0* 8.9  MG  --  2.1  --  2.1  PHOS  --   --   --  4.9*   GFR: Estimated Creatinine Clearance: 18.3 mL/min (A) (by C-G formula based on SCr of 2.98 mg/dL (H)). Liver Function Tests: Recent Labs  Lab 05/31/19 1743 06/01/19 0153 06/02/19 0200  AST 32 29 24  ALT 20 17 15   ALKPHOS 121 96 99  BILITOT  1.2 0.9 0.8  PROT 6.6 5.7* 6.3*  ALBUMIN 2.9* 2.5* 2.7*   No results for input(s): LIPASE, AMYLASE in the last 168 hours. No results for input(s): AMMONIA in the last 168 hours. Coagulation Profile: No results for input(s): INR, PROTIME in the last 168 hours. Cardiac Enzymes: No results for input(s): CKTOTAL, CKMB, CKMBINDEX, TROPONINI in the last 168 hours. BNP (last 3 results) No results for input(s): PROBNP in the last 8760 hours. HbA1C: No results for input(s): HGBA1C in the last 72 hours. CBG: Recent Labs  Lab 06/01/19 1535 06/01/19 1934 06/02/19 0000 06/02/19 0331 06/02/19 0757  GLUCAP 136* 111* 158* 141* 138*   Lipid Profile: No results for input(s): CHOL, HDL, LDLCALC, TRIG, CHOLHDL, LDLDIRECT in the last 72 hours. Thyroid Function Tests: No results for input(s): TSH, T4TOTAL, FREET4, T3FREE, THYROIDAB in the last 72 hours. Anemia Panel: No results for input(s): VITAMINB12, FOLATE, FERRITIN, TIBC, IRON, RETICCTPCT in the last 72 hours. Sepsis Labs: Recent Labs  Lab 05/31/19 1744 05/31/19 1918 06/01/19 0153  LATICACIDVEN 2.6* 1.9 2.2*    Recent Results (from the past 240 hour(s))  Blood culture (routine x 2)     Status: None (Preliminary result)   Collection Time: 05/31/19  5:18 PM   Specimen: BLOOD RIGHT ARM  Result Value Ref Range Status   Specimen Description BLOOD RIGHT ARM  Final   Special Requests   Final    BOTTLES DRAWN AEROBIC AND ANAEROBIC Blood Culture  adequate volume   Culture   Final    NO GROWTH 2 DAYS Performed at [facility], [address], [place], [place] [address]    Report Status PENDING  Incomplete  Blood culture (routine x 2)     Status: None (Preliminary result)   Collection Time: 05/31/19  5:23 PM   Specimen: BLOOD RIGHT HAND  Result Value Ref Range Status   Specimen Description BLOOD RIGHT HAND  Final   Special Requests   Final    BOTTLES DRAWN AEROBIC AND ANAEROBIC Blood Culture results may not be optimal due to an inadequate volume of blood received in culture bottles   Culture   Final    NO GROWTH 2 DAYS Performed at [facility], [address] [place], [place] [address]    Report Status PENDING  Incomplete  Respiratory Panel by RT PCR (Flu A&B, Covid) - Nasopharyngeal Swab     Status: None   Collection Time: 05/31/19  6:45 PM   Specimen: Nasopharyngeal Swab  Result Value Ref Range Status   SARS Coronavirus 2 by RT PCR NEGATIVE NEGATIVE Final    Comment: (NOTE) SARS-CoV-2 target nucleic acids are NOT DETECTED. The SARS-CoV-2 RNA is generally detectable in upper respiratoy specimens during the acute phase of infection. The lowest concentration of SARS-CoV-2 viral copies this assay can detect is 131 copies/mL. A negative result does not preclude SARS-Cov-2 infection and should not be used as the sole basis for treatment or other patient management decisions. A negative result may occur with  improper specimen collection/handling, submission of specimen other than nasopharyngeal swab, presence of viral mutation(s) within the areas targeted by this assay, and inadequate number of viral copies (<131 copies/mL). A negative result must be combined with clinical observations, patient history, and epidemiological information. The expected result is Negative. Fact Sheet for Patients:  https://www.moore.com/ Fact Sheet for Healthcare Providers:    https://www.young.biz/ This test is not yet ap proved or cleared by the [place] FDA and  has been  authorized for detection and/or diagnosis of SARS-CoV-2 by FDA under an Emergency Use Authorization (EUA). This EUA will remain  in effect (meaning this test can be used) for the duration of the COVID-19 declaration under Section 564(b)(1) of the Act, 21 U.S.C. section 360bbb-3(b)(1), unless the authorization is terminated or revoked sooner.    Influenza A by PCR NEGATIVE NEGATIVE Final   Influenza B by PCR NEGATIVE NEGATIVE Final    Comment: (NOTE) The Xpert Xpress SARS-CoV-2/FLU/RSV assay is intended as an aid in  the diagnosis of influenza from Nasopharyngeal swab specimens and  should not be used as a sole basis for treatment. Nasal washings and  aspirates are unacceptable for Xpert Xpress SARS-CoV-2/FLU/RSV  testing. Fact Sheet for Patients: https://www.moore.com/ Fact Sheet for Healthcare Providers: https://www.young.biz/ This test is not yet approved or cleared by the [place] FDA and  has been authorized for detection and/or diagnosis of SARS-CoV-2 by  FDA under an Emergency Use Authorization (EUA). This EUA will remain  in effect (meaning this test can be used) for the duration of the  Covid-19 declaration under Section 564(b)(1) of the Act, 21  U.S.C. section 360bbb-3(b)(1), unless the authorization is  terminated or revoked. Performed at [facility], [address], [place], [place] [address]          Radiology Studies: No results found.      Scheduled Meds: . apixaban  5 mg Oral BID  . atorvastatin  80 mg Oral QHS  . clopidogrel  75 mg Oral Daily  . furosemide  40 mg Intravenous BID  . insulin aspart  0-5 Units Subcutaneous QHS  . insulin aspart  0-9 Units Subcutaneous TID WC  . isosorbide mononitrate  90 mg Oral Daily  . lidocaine (PF)      . lidocaine  5 mL Infiltration Once  .  metoprolol succinate  100 mg Oral Daily  . pantoprazole  40 mg Oral Daily   Continuous Infusions: . ceFEPime (MAXIPIME) IV 2 g (06/01/19 1802)  . linezolid (ZYVOX) IV 600 mg (06/02/19 0825)          Glade Lloyd, MD [facility] 06/02/2019, 11:20 AM

## 2019-06-03 ENCOUNTER — Inpatient Hospital Stay (HOSPITAL_COMMUNITY): Payer: Medicare Other

## 2019-06-03 DIAGNOSIS — I482 Chronic atrial fibrillation, unspecified: Secondary | ICD-10-CM | POA: Diagnosis not present

## 2019-06-03 DIAGNOSIS — L03116 Cellulitis of left lower limb: Secondary | ICD-10-CM | POA: Diagnosis not present

## 2019-06-03 DIAGNOSIS — E1165 Type 2 diabetes mellitus with hyperglycemia: Secondary | ICD-10-CM | POA: Diagnosis not present

## 2019-06-03 DIAGNOSIS — R110 Nausea: Secondary | ICD-10-CM

## 2019-06-03 DIAGNOSIS — I5022 Chronic systolic (congestive) heart failure: Secondary | ICD-10-CM | POA: Diagnosis not present

## 2019-06-03 LAB — URINALYSIS, ROUTINE W REFLEX MICROSCOPIC
Bilirubin Urine: NEGATIVE
Glucose, UA: NEGATIVE mg/dL
Hgb urine dipstick: NEGATIVE
Ketones, ur: 5 mg/dL — AB
Leukocytes,Ua: NEGATIVE
Nitrite: NEGATIVE
Protein, ur: NEGATIVE mg/dL
Specific Gravity, Urine: 1.015 (ref 1.005–1.030)
pH: 5 (ref 5.0–8.0)

## 2019-06-03 LAB — CBC
HCT: 28.1 % — ABNORMAL LOW (ref 36.0–46.0)
Hemoglobin: 8.2 g/dL — ABNORMAL LOW (ref 12.0–15.0)
MCH: 21.8 pg — ABNORMAL LOW (ref 26.0–34.0)
MCHC: 29.2 g/dL — ABNORMAL LOW (ref 30.0–36.0)
MCV: 74.5 fL — ABNORMAL LOW (ref 80.0–100.0)
Platelets: 292 10*3/uL (ref 150–400)
RBC: 3.77 MIL/uL — ABNORMAL LOW (ref 3.87–5.11)
RDW: 19.5 % — ABNORMAL HIGH (ref 11.5–15.5)
WBC: 10.7 10*3/uL — ABNORMAL HIGH (ref 4.0–10.5)
nRBC: 1.4 % — ABNORMAL HIGH (ref 0.0–0.2)

## 2019-06-03 LAB — COMPREHENSIVE METABOLIC PANEL
ALT: 13 U/L (ref 0–44)
AST: 23 U/L (ref 15–41)
Albumin: 2.5 g/dL — ABNORMAL LOW (ref 3.5–5.0)
Alkaline Phosphatase: 92 U/L (ref 38–126)
Anion gap: 11 (ref 5–15)
BUN: 73 mg/dL — ABNORMAL HIGH (ref 8–23)
CO2: 17 mmol/L — ABNORMAL LOW (ref 22–32)
Calcium: 8.7 mg/dL — ABNORMAL LOW (ref 8.9–10.3)
Chloride: 104 mmol/L (ref 98–111)
Creatinine, Ser: 3.09 mg/dL — ABNORMAL HIGH (ref 0.44–1.00)
GFR calc Af Amer: 17 mL/min — ABNORMAL LOW (ref >60)
GFR calc non Af Amer: 15 mL/min — ABNORMAL LOW (ref >60)
Glucose, Bld: 141 mg/dL — ABNORMAL HIGH (ref 70–99)
Potassium: 4.6 mmol/L (ref 3.5–5.1)
Sodium: 132 mmol/L — ABNORMAL LOW (ref 135–145)
Total Bilirubin: 1.1 mg/dL (ref 0.3–1.2)
Total Protein: 6.6 g/dL (ref 6.5–8.1)

## 2019-06-03 LAB — MAGNESIUM: Magnesium: 1.9 mg/dL (ref 1.7–2.4)

## 2019-06-03 LAB — GLUCOSE, CAPILLARY
Glucose-Capillary: 130 mg/dL — ABNORMAL HIGH (ref 70–99)
Glucose-Capillary: 131 mg/dL — ABNORMAL HIGH (ref 70–99)
Glucose-Capillary: 140 mg/dL — ABNORMAL HIGH (ref 70–99)
Glucose-Capillary: 147 mg/dL — ABNORMAL HIGH (ref 70–99)
Glucose-Capillary: 154 mg/dL — ABNORMAL HIGH (ref 70–99)
Glucose-Capillary: 165 mg/dL — ABNORMAL HIGH (ref 70–99)

## 2019-06-03 MED ORDER — PANTOPRAZOLE SODIUM 40 MG PO TBEC
40.0000 mg | DELAYED_RELEASE_TABLET | Freq: Two times a day (BID) | ORAL | Status: DC
  Administered 2019-06-03 – 2019-06-05 (×4): 40 mg via ORAL
  Filled 2019-06-03 (×4): qty 1

## 2019-06-03 NOTE — Progress Notes (Signed)
Pharmacy Antibiotic Note  [name] [name] is a 69 y.o. female admitted on 05/31/2019 with wound infection.  Pharmacy has been consulted for cefepime dosing.  Vancomycin switched to Zyvox due to renal dysfunction.  SCr up to 3.09, CrCL 18 ml/min, afebrile, WBC up to 10.7.  Plan: Zyvox 600mg  IV Q12H Cefepime 2g IV Q24H  Monitor renal fxn, clinical progress, abx LOT   Height: 5\' 3"  (160 cm) Weight: 83.9 kg (185 lb) IBW/kg (Calculated) : 52.4  Temp (24hrs), Avg:97.9 F (36.6 C), Min:97.4 F (36.3 C), Max:98.4 F (36.9 C)  Recent Labs  Lab 05/31/19 1743 05/31/19 1744 05/31/19 1918 06/01/19 0153 06/02/19 0200 06/03/19 0335  WBC 9.9  --   --  9.5 9.2 10.7*  CREATININE 4.02*  --   --  3.37* 2.98* 3.09*  LATICACIDVEN  --  2.6* 1.9 2.2*  --   --     Estimated Creatinine Clearance: 17.6 mL/min (A) (by C-G formula based on SCr of 3.09 mg/dL (H)).    Allergies  Allergen Reactions  . Bactrim [Sulfamethoxazole-Trimethoprim] Nausea And Vomiting  . Doxycycline Other (See Comments)    Lethargy   . Hydrochlorothiazide Other (See Comments)    Lethargy   . Latex Rash  . Penicillins Swelling and Rash    Pt states she has tolerated Keflex in the past without problems. States she may have tolerated Augmentin in the past but it caused GI upset. Has patient had a PCN reaction causing immediate rash, facial/tongue/throat swelling, SOB or lightheadedness with hypotension: Yes Has patient had a PCN reaction causing severe rash involving mucus membranes or skin necrosis: No Has patient had a PCN reaction that required hospitalization No Has patient had a PCN reaction occurring within the last 10 years: No    Cefepime 5/3>> Vanc 5/3 >> 5/4 Zyvox 5/4 >> Septra PTA  5/3 covid / flu - negative 5/3 BCx NGTD  [name] 7/3, PharmD, BCPS, BCCCP 06/03/2019, 10:58 AM

## 2019-06-03 NOTE — Progress Notes (Signed)
Physical Therapy Treatment Patient Details Name: [name] [name] MRN: [id number] DOB: [birth year] Today's Date: 06/03/2019    History of Present Illness [name] [name] is a 69 y.o. female with a history of HTN, HLD, DM2, CKD, CAD, PAD on Plavix, prior CVA, CHF, COPD and A fib on Eliquis who we are asked to see for skin biopsy and sent to ED on 5/3 for wound check.    PT Comments    Pt remains limited in activity tolerance by LE pain and also with reports of some dizziness initially during session although BP stable. Pt continues to require physical assistance to perform all functional mobility at this time due to generalized weakness. Pt reports throbbing pain with LLE in dependent position, reporting it feels as if her "whole leg might burst", but this appears to calm with exercise and increased time sitting at edge of bed. Pt will benefit from continuing attempts at lateral scooting transfers to drop arm recliner or wheelchair as WB may be too painful at the current time. PT also continues to recommend SNF placement due to increased falls risk and mobility deficits.  Follow Up Recommendations  SNF;Supervision/Assistance - 24 hour     Equipment Recommendations  Hospital bed;Other (comment)(mechanical lift)    Recommendations for Other Services       Precautions / Restrictions Precautions Precautions: Fall Precaution Comments: bilat LE wounds in dressings Restrictions Weight Bearing Restrictions: No    Mobility  Bed Mobility Overal bed mobility: Needs Assistance Bed Mobility: Supine to Sit;Sit to Supine     Supine to sit: Mod assist;HOB elevated Sit to supine: Mod assist      Transfers Overall transfer level: (pt declines transfer attempts, without drop arm recliner)                  Ambulation/Gait                 Stairs             Wheelchair Mobility    Modified Rankin (Stroke Patients Only)       Balance Overall balance assessment: Needs  assistance Sitting-balance support: Single extremity supported;Feet unsupported Sitting balance-Leahy Scale: Good Sitting balance - Comments: supervision                                    Cognition Arousal/Alertness: Awake/alert Behavior During Therapy: Anxious Overall Cognitive Status: Within Functional Limits for tasks assessed                                        Exercises General Exercises - Lower Extremity Ankle Circles/Pumps: AROM;Both;15 reps;Seated Quad Sets: AROM;Both;5 reps;Supine Gluteal Sets: AROM;Both;10 reps;Supine Long Arc Quad: AROM;Both;10 reps;Seated Other Exercises Other Exercises: hip internal rotation AROM bilateral LE 10 reps    General Comments General comments (skin integrity, edema, etc.): VSS on RA, BP 101/58, pt does reports some dizziness but this improves with exercise      Pertinent Vitals/Pain Pain Assessment: 0-10 Pain Score: 10-Worst pain ever Pain Location: BLE Pain Descriptors / Indicators: Burning Pain Intervention(s): Monitored during session    Home Living                      Prior Function            PT Goals (  current goals can now be found in the care plan section) Acute Rehab PT Goals Patient Stated Goal: to reduce pain Progress towards PT goals: Progressing toward goals    Frequency    Min 2X/week      PT Plan      Co-evaluation              AM-PAC PT "6 Clicks" Mobility   Outcome Measure  Help needed turning from your back to your side while in a flat bed without using bedrails?: A Lot Help needed moving from lying on your back to sitting on the side of a flat bed without using bedrails?: A Lot Help needed moving to and from a bed to a chair (including a wheelchair)?: Total Help needed standing up from a chair using your arms (e.g., wheelchair or bedside chair)?: Total Help needed to walk in hospital room?: Total Help needed climbing 3-5 steps with a railing? :  Total 6 Click Score: 8    End of Session   Activity Tolerance: Patient limited by pain Patient left: in bed;with call bell/phone within reach;with bed alarm set;with family/visitor present Nurse Communication: Mobility status PT Visit Diagnosis: Difficulty in walking, not elsewhere classified (R26.2);Pain Pain - Right/Left: (bilateral) Pain - part of body: Leg     Time: 7035-0093 PT Time Calculation (min) (ACUTE ONLY): 19 min  Charges:  $Therapeutic Exercise: 8-22 mins                     Arlyss Gandy, PT, DPT Acute Rehabilitation Pager: [phone]    Arlyss Gandy 06/03/2019, 5:14 PM

## 2019-06-03 NOTE — Final Consult Note (Signed)
Consultant Final Sign-Off Note    Assessment/Final recommendations  [name] [name] is a 69 y.o. female followed by me for Ruptured and intact Bullae of the lower extremities with cellulitis skin changes of the LLE. There was concern for possible autoimmune cause so a skin bx was requested. This was performed on 5/5. No bleeding noted at site today. We will sign off. Primary team to follow up on path.   Wound care (if applicable): Surgicel can be removed during dressing change. Continue current dressing change regimen to LE's.    Diet at discharge: per primary team   Activity at discharge: per primary team   Follow-up appointment:  PRN   Pending results:  Unresulted Labs (From admission, onward)    Start     Ordered   06/04/19 0500  Magnesium  Daily,   R    Question:  Specimen collection method  Answer:  Lab=Lab collect   06/03/19 1024   06/03/19 1026  Urinalysis, Routine w reflex microscopic  ONCE - STAT,   STAT     06/03/19 1025   06/01/19 0500  CBC  Daily,   R     05/31/19 1957   06/01/19 0500  Comprehensive metabolic panel  Daily,   R     05/31/19 1957           Medication recommendations:   Other recommendations:    Thank you for allowing [place] to participate in the care of your patient!  Please consult [place] again if you have further needs for your patient.  Barth Kirks [facility] 06/03/2019 11:18 AM    Subjective   Doing well. No bleeding overnight from skin bx site. Hgb stable   Objective  Vital signs in last 24 hours: Temp:  [97.4 F (36.3 C)-98.4 F (36.9 C)] 98 F (36.7 C) (05/06 0800) Pulse Rate:  [75-80] 79 (05/06 0800) Resp:  [14-15] 14 (05/06 0800) BP: (104-120)/(49-87) 119/54 (05/06 0800) SpO2:  [93 %-98 %] 93 % (05/06 0800)  Skin: Skin bx site with dissolvable suture in place. No active bleeding.   Pertinent labs and Studies: Recent Labs    06/01/19 0153 06/02/19 0200 06/03/19 0335  WBC 9.5 9.2 10.7*  HGB 8.5* 8.5* 8.2*  HCT 28.9* 28.9* 28.1*    BMET Recent Labs    06/02/19 0200 06/03/19 0335  NA 133* 132*  K 4.9 4.6  CL 105 104  CO2 14* 17*  GLUCOSE 148* 141*  BUN 71* 73*  CREATININE 2.98* 3.09*  CALCIUM 8.9 8.7*   No results for input(s): LABURIN in the last 72 hours. Results for orders placed or performed during the hospital encounter of 05/31/19  Blood culture (routine x 2)     Status: None (Preliminary result)   Collection Time: 05/31/19  5:18 PM   Specimen: BLOOD RIGHT ARM  Result Value Ref Range Status   Specimen Description BLOOD RIGHT ARM  Final   Special Requests   Final    BOTTLES DRAWN AEROBIC AND ANAEROBIC Blood Culture adequate volume   Culture   Final    NO GROWTH 3 DAYS Performed at [facility], [place] [address], [place], [place] [address]    Report Status PENDING  Incomplete  Blood culture (routine x 2)     Status: None (Preliminary result)   Collection Time: 05/31/19  5:23 PM   Specimen: BLOOD RIGHT HAND  Result Value Ref Range Status   Specimen Description BLOOD RIGHT HAND  Final   Special Requests  Final    BOTTLES DRAWN AEROBIC AND ANAEROBIC Blood Culture results may not be optimal due to an inadequate volume of blood received in culture bottles   Culture   Final    NO GROWTH 3 DAYS Performed at [facility], [address] [place], [place] [address]    Report Status PENDING  Incomplete  Respiratory Panel by RT PCR (Flu A&B, Covid) - Nasopharyngeal Swab     Status: None   Collection Time: 05/31/19  6:45 PM   Specimen: Nasopharyngeal Swab  Result Value Ref Range Status   SARS Coronavirus 2 by RT PCR NEGATIVE NEGATIVE Final    Comment: (NOTE) SARS-CoV-2 target nucleic acids are NOT DETECTED. The SARS-CoV-2 RNA is generally detectable in upper respiratoy specimens during the acute phase of infection. The lowest concentration of SARS-CoV-2 viral copies this assay can detect is 131 copies/mL. A negative result does not preclude SARS-Cov-2 infection and should not be used as  the sole basis for treatment or other patient management decisions. A negative result may occur with  improper specimen collection/handling, submission of specimen other than nasopharyngeal swab, presence of viral mutation(s) within the areas targeted by this assay, and inadequate number of viral copies (<131 copies/mL). A negative result must be combined with clinical observations, patient history, and epidemiological information. The expected result is Negative. Fact Sheet for Patients:  https://www.moore.com/ Fact Sheet for Healthcare Providers:  https://www.young.biz/ This test is not yet ap proved or cleared by the [place] FDA and  has been authorized for detection and/or diagnosis of SARS-CoV-2 by FDA under an Emergency Use Authorization (EUA). This EUA will remain  in effect (meaning this test can be used) for the duration of the COVID-19 declaration under Section 564(b)(1) of the Act, 21 U.S.C. section 360bbb-3(b)(1), unless the authorization is terminated or revoked sooner.    Influenza A by PCR NEGATIVE NEGATIVE Final   Influenza B by PCR NEGATIVE NEGATIVE Final    Comment: (NOTE) The Xpert Xpress SARS-CoV-2/FLU/RSV assay is intended as an aid in  the diagnosis of influenza from Nasopharyngeal swab specimens and  should not be used as a sole basis for treatment. Nasal washings and  aspirates are unacceptable for Xpert Xpress SARS-CoV-2/FLU/RSV  testing. Fact Sheet for Patients: https://www.moore.com/ Fact Sheet for Healthcare Providers: https://www.young.biz/ This test is not yet approved or cleared by the [place] FDA and  has been authorized for detection and/or diagnosis of SARS-CoV-2 by  FDA under an Emergency Use Authorization (EUA). This EUA will remain  in effect (meaning this test can be used) for the duration of the  Covid-19 declaration under Section 564(b)(1) of the Act, 21   U.S.C. section 360bbb-3(b)(1), unless the authorization is  terminated or revoked. Performed at [facility], [address], [place], [place] [address]     Imaging: No results found.

## 2019-06-03 NOTE — Progress Notes (Signed)
Patient ID: [name] [name], female   DOB: [birth year], 69 y.o.   MRN: [id number]  PROGRESS NOTE    [name] [name]  XHB:[id number] DOB: March 16, 1950 DOA: 05/31/2019 PCP: Georgann Housekeeper, MD   Brief Narrative:  69 year old female with history of DM2, HTN, chronic kidney disease, coronary artery disease, PVD, chronic combined CHF, prior CVA, hyperlipidemia, A. fib on Eliquis, COPD presented with left leg wound infection/cellulitis, hyperkalemia and acute kidney injury.  Patient has had worsening pain and redness of the left lower extremity for the last 3 days.  She was on Bactrim as an outpatient but she stopped last week due to nausea and vomiting.  She was admitted on vancomycin and cefepime.  Assessment & Plan:   Left lower extremity cellulitis in the setting of chronic venous insufficiency and chronic edema -Continue zyvox and cefepime.  Cultures negative so far.  She appears to have failed Bactrim as an outpatient -Wound care evaluation appreciated  Bullous skin lesions -these can definitely be caused by acute on chronic lower extremity swelling however it would not explain the lesion that she noticed in her mouth last time she was in the hospital.  Need to rule out autoimmune skin conditions -general surgery is planning for a skin biopsy today.  Appreciate input.  -Might need outpatient dermatology follow-up  Acute kidney injury on chronic renal disease stage IIIb -Baseline creatinine 1.7 in March to 121 but recently around 2.5 -Creatinine 4.02 on admission; treated with IV fluids and subsequently discontinued -Creatinine 3.09 today.  Monitor  Acute on chronic combined CHF -Most recent 2D echo in February 2 on 21 showed EF of 25 to 30% with grade 3 diastolic dysfunction -Currently on IV Lasix.  Strict input and output.  Daily weights.  Fluid restriction -Negative balance of 1275 cc since admission.  Hold Lasix doses for today because of slight bump in creatinine today.  Probably start oral Lasix  tomorrow.  Intermittent nausea and vomiting -Still complaining of nausea despite being on Zofran.  LFTs normal.  Continue Protonix twice a day and as needed Maalox.  Will check x-ray of the abdomen and right upper quadrant ultrasound.  If unremarkable, might have to do CT of the abdomen.  Coronary artery disease/PAD -no chest pain, no concern for ACS.  Continue Plavix, Imdur  Type 2 diabetes mellitus -her most recent A1c was 7.1, her Levemir has been held on admission due to low CBGs, placed on sliding scale.  -Continue CBGs with SSI  Permanent A. Fib -continue metoprolol for rate control; continue Eliquis.  Hyperlipidemia -continue atorvastatin  Essential hypertension -continue metoprolol, she was started on a lower dose than home due to heart rate into the 60s in the ED  Generalized deconditioning -PT recommends SNF placement.  Social worker consulted  DVT prophylaxis: Eliquis  Code Status: Full Family Communication: Patient at bedside Disposition Plan: Status is: Inpatient  Remains inpatient appropriate because:IV treatments appropriate due to intensity of illness or inability to take PO.  Still on intravenous Lasix and has a bump in her creatinine.  Also has intermittent nausea.  Will probably ready for discharge to SNF in 1 to 2 days   Dispo: The patient is from: Home              Anticipated d/c is to: SNF              Anticipated d/c date is: 1-2 days              Patient currently  is not medically stable to d/c.   Consultants: General surgery Procedures: None  Antimicrobials:  Anti-infectives (From admission, onward)   Start     Dose/Rate Route Frequency Ordered Stop   06/01/19 1800  ceFEPIme (MAXIPIME) 2 g in sodium chloride 0.9 % 100 mL IVPB     2 g 200 mL/hr over 30 Minutes Intravenous Every 24 hours 05/31/19 1932     06/01/19 1115  linezolid (ZYVOX) IVPB 600 mg     600 mg 300 mL/hr over 60 Minutes Intravenous Every 12 hours 06/01/19 1028     05/31/19  1932  vancomycin variable dose per unstable renal function (pharmacist dosing)  Status:  Discontinued      Does not apply See admin instructions 05/31/19 1932 06/01/19 1028   05/31/19 1730  vancomycin (VANCOREADY) IVPB 1750 mg/350 mL     1,750 mg 175 mL/hr over 120 Minutes Intravenous  Once 05/31/19 1718 05/31/19 2046   05/31/19 1730  ceFEPIme (MAXIPIME) 2 g in sodium chloride 0.9 % 100 mL IVPB     2 g 200 mL/hr over 30 Minutes Intravenous  Once 05/31/19 1718 05/31/19 1822       Subjective: Patient seen and examined at bedside.  She does not feel well.  Still complains of nausea and poor oral intake.  No overnight vomiting though.  No worsening shortness of breath or chest pain.   Objective: Vitals:   06/02/19 2343 06/03/19 0329 06/03/19 0338 06/03/19 0415  BP: 119/82 (!) 113/49 107/72   Pulse: 78 76 80   Resp:      Temp: 98.4 F (36.9 C)   97.6 F (36.4 C)  TempSrc: Oral   Oral  SpO2: 93% 94% 94%   Weight:      Height:        Intake/Output Summary (Last 24 hours) at 06/03/2019 0747 Last data filed at 06/03/2019 0500 Gross per 24 hour  Intake --  Output 400 ml  Net -400 ml   Filed Weights   05/31/19 1703  Weight: 83.9 kg    Examination:  General exam: No acute distress.  Chronically ill looking. Respiratory system: Bilateral decreased breath sounds at bases, no wheezing.  Some scattered crackles  cardiovascular system: Rate controlled, S1-S2 heard Gastrointestinal system: Abdomen is nondistended, soft and nontender.  Bowel sounds are heard.   Extremities: No cyanosis, clubbing; bilateral lower extremity 1-2+ edema present.  Left foot dressing present.  Right lower extremity dressing present. Central nervous system: Awake and alert.  Poor historian.  No focal neurological deficits. Moving extremities Skin: No ulcers or lesions. psychiatry: Extremely anxious looking    Data Reviewed: I have personally reviewed following labs and imaging studies  CBC: Recent Labs   Lab 05/31/19 1743 06/01/19 0153 06/02/19 0200 06/03/19 0335  WBC 9.9 9.5 9.2 10.7*  NEUTROABS 7.3  --   --   --   HGB 9.2* 8.5* 8.5* 8.2*  HCT 30.9* 28.9* 28.9* 28.1*  MCV 75.6* 75.1* 74.5* 74.5*  PLT 404* 347 301 292   Basic Metabolic Panel: Recent Labs  Lab 05/31/19 1743 05/31/19 1924 06/01/19 0153 06/02/19 0200 06/03/19 0335  NA 133*  --  135 133* 132*  K 6.4*  --  5.1 4.9 4.6  CL 102  --  107 105 104  CO2 18*  --  15* 14* 17*  GLUCOSE 148*  --  78 148* 141*  BUN 82*  --  74* 71* 73*  CREATININE 4.02*  --  3.37* 2.98* 3.09*  CALCIUM 8.9  --  8.0* 8.9 8.7*  MG  --  2.1  --  2.1 1.9  PHOS  --   --   --  4.9*  --    GFR: Estimated Creatinine Clearance: 17.6 mL/min (A) (by C-G formula based on SCr of 3.09 mg/dL (H)). Liver Function Tests: Recent Labs  Lab 05/31/19 1743 06/01/19 0153 06/02/19 0200 06/03/19 0335  AST 32 29 24 23   ALT 20 17 15 13   ALKPHOS 121 96 99 92  BILITOT 1.2 0.9 0.8 1.1  PROT 6.6 5.7* 6.3* 6.6  ALBUMIN 2.9* 2.5* 2.7* 2.5*   No results for input(s): LIPASE, AMYLASE in the last 168 hours. No results for input(s): AMMONIA in the last 168 hours. Coagulation Profile: No results for input(s): INR, PROTIME in the last 168 hours. Cardiac Enzymes: No results for input(s): CKTOTAL, CKMB, CKMBINDEX, TROPONINI in the last 168 hours. BNP (last 3 results) No results for input(s): PROBNP in the last 8760 hours. HbA1C: No results for input(s): HGBA1C in the last 72 hours. CBG: Recent Labs  Lab 06/02/19 1611 06/02/19 1946 06/02/19 2224 06/03/19 0002 06/03/19 0411  GLUCAP 151* 119* 127* 165* 147*   Lipid Profile: No results for input(s): CHOL, HDL, LDLCALC, TRIG, CHOLHDL, LDLDIRECT in the last 72 hours. Thyroid Function Tests: No results for input(s): TSH, T4TOTAL, FREET4, T3FREE, THYROIDAB in the last 72 hours. Anemia Panel: No results for input(s): VITAMINB12, FOLATE, FERRITIN, TIBC, IRON, RETICCTPCT in the last 72 hours. Sepsis  Labs: Recent Labs  Lab 05/31/19 1744 05/31/19 1918 06/01/19 0153  LATICACIDVEN 2.6* 1.9 2.2*    Recent Results (from the past 240 hour(s))  Blood culture (routine x 2)     Status: None (Preliminary result)   Collection Time: 05/31/19  5:18 PM   Specimen: BLOOD RIGHT ARM  Result Value Ref Range Status   Specimen Description BLOOD RIGHT ARM  Final   Special Requests   Final    BOTTLES DRAWN AEROBIC AND ANAEROBIC Blood Culture adequate volume   Culture   Final    NO GROWTH 3 DAYS Performed at [facility], [address] [place], [address] [place]    Report Status PENDING  Incomplete  Blood culture (routine x 2)     Status: None (Preliminary result)   Collection Time: 05/31/19  5:23 PM   Specimen: BLOOD RIGHT HAND  Result Value Ref Range Status   Specimen Description BLOOD RIGHT HAND  Final   Special Requests   Final    BOTTLES DRAWN AEROBIC AND ANAEROBIC Blood Culture results may not be optimal due to an inadequate volume of blood received in culture bottles   Culture   Final    NO GROWTH 3 DAYS Performed at [facility], [address] [place], [address] [place]    Report Status PENDING  Incomplete  Respiratory Panel by RT PCR (Flu A&B, Covid) - Nasopharyngeal Swab     Status: None   Collection Time: 05/31/19  6:45 PM   Specimen: Nasopharyngeal Swab  Result Value Ref Range Status   SARS Coronavirus 2 by RT PCR NEGATIVE NEGATIVE Final    Comment: (NOTE) SARS-CoV-2 target nucleic acids are NOT DETECTED. The SARS-CoV-2 RNA is generally detectable in upper respiratoy specimens during the acute phase of infection. The lowest concentration of SARS-CoV-2 viral copies this assay can detect is 131 copies/mL. A negative result does not preclude SARS-Cov-2 infection and should not be used as the sole basis for treatment or other  patient management decisions. A negative result may occur with  improper specimen collection/handling, submission of specimen other than  nasopharyngeal swab, presence of viral mutation(s) within the areas targeted by this assay, and inadequate number of viral copies (<131 copies/mL). A negative result must be combined with clinical observations, patient history, and epidemiological information. The expected result is Negative. Fact Sheet for Patients:  PinkCheek.be Fact Sheet for Healthcare Providers:  GravelBags.it This test is not yet ap proved or cleared by the [place] FDA and  has been authorized for detection and/or diagnosis of SARS-CoV-2 by FDA under an Emergency Use Authorization (EUA). This EUA will remain  in effect (meaning this test can be used) for the duration of the COVID-19 declaration under Section 564(b)(1) of the Act, 21 U.S.C. section 360bbb-3(b)(1), unless the authorization is terminated or revoked sooner.    Influenza A by PCR NEGATIVE NEGATIVE Final   Influenza B by PCR NEGATIVE NEGATIVE Final    Comment: (NOTE) The Xpert Xpress SARS-CoV-2/FLU/RSV assay is intended as an aid in  the diagnosis of influenza from Nasopharyngeal swab specimens and  should not be used as a sole basis for treatment. Nasal washings and  aspirates are unacceptable for Xpert Xpress SARS-CoV-2/FLU/RSV  testing. Fact Sheet for Patients: PinkCheek.be Fact Sheet for Healthcare Providers: GravelBags.it This test is not yet approved or cleared by the [place] FDA and  has been authorized for detection and/or diagnosis of SARS-CoV-2 by  FDA under an Emergency Use Authorization (EUA). This EUA will remain  in effect (meaning this test can be used) for the duration of the  Covid-19 declaration under Section 564(b)(1) of the Act, 21  U.S.C. section 360bbb-3(b)(1), unless the authorization is  terminated or revoked. Performed at [facility], [place] [address] [place], [place] [address]           Radiology Studies: No results found.      Scheduled Meds: . apixaban  5 mg Oral BID  . atorvastatin  80 mg Oral QHS  . clopidogrel  75 mg Oral Daily  . furosemide  40 mg Intravenous BID  . insulin aspart  0-5 Units Subcutaneous QHS  . insulin aspart  0-9 Units Subcutaneous TID WC  . isosorbide mononitrate  90 mg Oral Daily  . metoprolol succinate  100 mg Oral Daily  . ondansetron (ZOFRAN) IV  4 mg Intravenous Q6H  . pantoprazole (PROTONIX) IV  40 mg Intravenous Q12H   Continuous Infusions: . ceFEPime (MAXIPIME) IV 2 g (06/02/19 1734)  . linezolid (ZYVOX) IV 600 mg (06/02/19 2215)          Aline August, MD [facility] 06/03/2019, 7:47 AM

## 2019-06-03 NOTE — TOC Progression Note (Signed)
Transition of Care [facility]) - Progression Note    Patient Details  Name: [name] [name] MRN: [id number] Date of Birth: August 24, 1950  Transition of Care [facility]) CM/SW Contact  [name] [name], [place] Phone Number: 06/03/2019, 9:53 AM  Clinical Narrative:     CSW talked to [name] via phone about bed offers. [name] [name] stated that her mom has been to [facility] in the past and that's where she would prefer her return to. [name] [name] stated that the pt is still saying that she does not want to go to snf and she wants to go home. [name] [name] stated she told her mom that she needs more support than what she can currently offer. [name] [name] states she will continue to talk to her mom about SNF. [name] [name] will call back with decision in SNF.   CSW will continue to follow.   Expected Discharge Plan: Skilled Nursing Facility Barriers to Discharge: Continued Medical Work up  Expected Discharge Plan and Services Expected Discharge Plan: Skilled Nursing Facility       Living arrangements for the past 2 months: Single Family Home                                       Social Determinants of Health (SDOH) Interventions    Readmission Risk Interventions Readmission Risk Prevention Plan 05/06/2019  Transportation Screening Complete  Medication Review Oceanographer) Complete  HRI or Home Care Consult Complete  SW Recovery Care/Counseling Consult Complete  Palliative Care Screening Not Applicable  Skilled Nursing Facility Complete  Some recent data might be hidden

## 2019-06-04 ENCOUNTER — Inpatient Hospital Stay (HOSPITAL_COMMUNITY): Payer: Medicare Other

## 2019-06-04 ENCOUNTER — Other Ambulatory Visit: Payer: Self-pay

## 2019-06-04 DIAGNOSIS — I5022 Chronic systolic (congestive) heart failure: Secondary | ICD-10-CM | POA: Diagnosis not present

## 2019-06-04 DIAGNOSIS — I482 Chronic atrial fibrillation, unspecified: Secondary | ICD-10-CM | POA: Diagnosis not present

## 2019-06-04 DIAGNOSIS — L03116 Cellulitis of left lower limb: Secondary | ICD-10-CM | POA: Diagnosis not present

## 2019-06-04 DIAGNOSIS — N179 Acute kidney failure, unspecified: Secondary | ICD-10-CM | POA: Diagnosis not present

## 2019-06-04 LAB — APTT
aPTT: 50 seconds — ABNORMAL HIGH (ref 24–36)
aPTT: 145 seconds — ABNORMAL HIGH (ref 24–36)

## 2019-06-04 LAB — HEPARIN LEVEL (UNFRACTIONATED): Heparin Unfractionated: >2.2 IU/mL — ABNORMAL HIGH (ref 0.30–0.70)

## 2019-06-04 LAB — COMPREHENSIVE METABOLIC PANEL
ALT: 13 U/L (ref 0–44)
AST: 24 U/L (ref 15–41)
Albumin: 2.4 g/dL — ABNORMAL LOW (ref 3.5–5.0)
Alkaline Phosphatase: 86 U/L (ref 38–126)
Anion gap: 14 (ref 5–15)
BUN: 73 mg/dL — ABNORMAL HIGH (ref 8–23)
CO2: 17 mmol/L — ABNORMAL LOW (ref 22–32)
Calcium: 8.7 mg/dL — ABNORMAL LOW (ref 8.9–10.3)
Chloride: 103 mmol/L (ref 98–111)
Creatinine, Ser: 2.99 mg/dL — ABNORMAL HIGH (ref 0.44–1.00)
GFR calc Af Amer: 18 mL/min — ABNORMAL LOW (ref >60)
GFR calc non Af Amer: 15 mL/min — ABNORMAL LOW (ref >60)
Glucose, Bld: 134 mg/dL — ABNORMAL HIGH (ref 70–99)
Potassium: 4.8 mmol/L (ref 3.5–5.1)
Sodium: 134 mmol/L — ABNORMAL LOW (ref 135–145)
Total Bilirubin: 1.2 mg/dL (ref 0.3–1.2)
Total Protein: 6.1 g/dL — ABNORMAL LOW (ref 6.5–8.1)

## 2019-06-04 LAB — MAGNESIUM: Magnesium: 2.1 mg/dL (ref 1.7–2.4)

## 2019-06-04 LAB — CBC
HCT: 28.7 % — ABNORMAL LOW (ref 36.0–46.0)
Hemoglobin: 8.5 g/dL — ABNORMAL LOW (ref 12.0–15.0)
MCH: 22.1 pg — ABNORMAL LOW (ref 26.0–34.0)
MCHC: 29.6 g/dL — ABNORMAL LOW (ref 30.0–36.0)
MCV: 74.5 fL — ABNORMAL LOW (ref 80.0–100.0)
Platelets: 286 10*3/uL (ref 150–400)
RBC: 3.85 MIL/uL — ABNORMAL LOW (ref 3.87–5.11)
RDW: 19.9 % — ABNORMAL HIGH (ref 11.5–15.5)
WBC: 10.1 10*3/uL (ref 4.0–10.5)
nRBC: 2 % — ABNORMAL HIGH (ref 0.0–0.2)

## 2019-06-04 LAB — SURGICAL PATHOLOGY

## 2019-06-04 LAB — GLUCOSE, CAPILLARY
Glucose-Capillary: 163 mg/dL — ABNORMAL HIGH (ref 70–99)
Glucose-Capillary: 214 mg/dL — ABNORMAL HIGH (ref 70–99)
Glucose-Capillary: 152 mg/dL — ABNORMAL HIGH (ref 70–99)
Glucose-Capillary: 125 mg/dL — ABNORMAL HIGH (ref 70–99)

## 2019-06-04 MED ORDER — HEPARIN (PORCINE) 25000 UT/250ML-% IV SOLN: 800.0000 [IU]/h | INTRAVENOUS | Status: AC

## 2019-06-04 MED ORDER — TECHNETIUM TC 99M MEBROFENIN IV KIT
5.5000 | PACK | Freq: Once | INTRAVENOUS | Status: AC | PRN
  Administered 2019-06-04: 5.5 via INTRAVENOUS

## 2019-06-04 MED ORDER — HEPARIN (PORCINE) 25000 UT/250ML-% IV SOLN
1000.0000 [IU]/h | INTRAVENOUS | Status: DC
  Administered 2019-06-04: 1000 [IU]/h via INTRAVENOUS
  Filled 2019-06-04: qty 250

## 2019-06-04 MED ORDER — FUROSEMIDE 40 MG PO TABS
40.0000 mg | ORAL_TABLET | Freq: Two times a day (BID) | ORAL | Status: DC
  Administered 2019-06-04 – 2019-06-05 (×2): 40 mg via ORAL
  Filled 2019-06-04 (×3): qty 1

## 2019-06-04 NOTE — TOC Progression Note (Signed)
Transition of Care [facility]) - Progression Note    Patient Details  Name: [name] [name] MRN: [id number] Date of Birth: 07-Mar-1950  Transition of Care [facility]) CM/SW Contact  Jimmy Picket, [place] Phone Number: 06/04/2019, 11:14 AM  Clinical Narrative:     CSW spoke to pt and pts daughter Leotis Shames and agree that pt wants to go to [facility]. CSW contacted MD. MD stated patient would possibly be ready tomorrow pending she is still stable. CSW called [facility] and stated they may not have another bed over the weekend. CSW called pts daughter about back up choices for snf. [name] stated she is coming to visit about 3pm and she will talk about it with her mom and give CSW a call. CSW has requested covid test.   CSW will continue to follow.   Expected Discharge Plan: Skilled Nursing Facility Barriers to Discharge: Continued Medical Work up  Expected Discharge Plan and Services Expected Discharge Plan: Skilled Nursing Facility       Living arrangements for the past 2 months: Single Family Home                                       Social Determinants of Health (SDOH) Interventions    Readmission Risk Interventions Readmission Risk Prevention Plan 05/06/2019  Transportation Screening Complete  Medication Review Oceanographer) Complete  HRI or Home Care Consult Complete  SW Recovery Care/Counseling Consult Complete  Palliative Care Screening Not Applicable  Skilled Nursing Facility Complete  Some recent data might be hidden   Jimmy Picket, Theresia Majors, [place] Clinical Social Worker [phone]

## 2019-06-04 NOTE — Progress Notes (Signed)
ANTICOAGULATION CONSULT NOTE - Follow Up Consult  Pharmacy Consult for Heparin Indication: atrial fibrillation  Allergies  Allergen Reactions  . Bactrim [Sulfamethoxazole-Trimethoprim] Nausea And Vomiting  . Doxycycline Other (See Comments)    Lethargy   . Hydrochlorothiazide Other (See Comments)    Lethargy   . Latex Rash  . Penicillins Swelling and Rash    Pt states she has tolerated Keflex in the past without problems. States she may have tolerated Augmentin in the past but it caused GI upset. Has patient had a PCN reaction causing immediate rash, facial/tongue/throat swelling, SOB or lightheadedness with hypotension: Yes Has patient had a PCN reaction causing severe rash involving mucus membranes or skin necrosis: No Has patient had a PCN reaction that required hospitalization No Has patient had a PCN reaction occurring within the last 10 years: No    Patient Measurements: Height: 5\' 3"  (160 cm) Weight: 83.9 kg (185 lb) IBW/kg (Calculated) : 52.4 Heparin Dosing Weight: 71 kg  Vital Signs: Temp: 97.8 F (36.6 C) (05/07 1532) Temp Source: Oral (05/07 1532) BP: 108/53 (05/07 1532) Pulse Rate: 65 (05/07 1532)  Labs: Recent Labs    06/02/19 0200 06/02/19 0200 06/03/19 0335 06/04/19 0506 06/04/19 1011 06/04/19 1801  HGB 8.5*   < > 8.2* 8.5*  --   --   HCT 28.9*  --  28.1* 28.7*  --   --   PLT 301  --  292 286  --   --   APTT  --   --   --   --  50* 145*  HEPARINUNFRC  --   --   --   --  >2.20*  --   CREATININE 2.98*  --  3.09* 2.99*  --   --    < > = values in this interval not displayed.    Estimated Creatinine Clearance: 18.2 mL/min (A) (by C-G formula based on SCr of 2.99 mg/dL (H)).   Medications:  Infusions:  . ceFEPime (MAXIPIME) IV 2 g (06/04/19 1737)  . heparin 1,000 Units/hr (06/04/19 1035)  . linezolid (ZYVOX) IV 600 mg (06/04/19 1044)    Assessment: 69 year old with history of atrial fibrillation on Eliquis prior (last dose 5/6 at 2200PM) now  transitioned to IV Heparin per pharmacy protocol. Patient has CKD.   Heparin running in R-arm, level drawn from L-hand.  No problems with infusion and no bleeding noted per RN or patient.  Hemoglobin low but stable based on prior labs.  Platelets are stable.   Goal of Therapy:  Heparin level 0.3-0.7 units/ml aPTT 66-102 seconds Monitor platelets by anticoagulation protocol: Yes   Plan:  Hold heparin x1 hour then reduce to 750 units/hr. Plan was communicated with RN - 78.  Heparin level 6 hours after restart.  Daily heparin level and CBC while on therapy.  Continue to monitor for any signs and symptoms of bleeding.   Joni Reining, PharmD, BCPS, BCCCP Clinical Pharmacist Please refer to [facility] for [facility] Pharmacy numbers 06/04/2019,7:09 PM

## 2019-06-04 NOTE — Progress Notes (Signed)
Patient ID: [name] [name], female   DOB: Apr 08, 1950, 69 y.o.   MRN: [id number]  PROGRESS NOTE    [name] [name]  HGD:[id number] DOB: 30-Mar-1950 DOA: 05/31/2019 PCP: [name] Housekeeper, MD   Brief Narrative:  69 year old female with history of DM2, HTN, chronic kidney disease, coronary artery disease, PVD, chronic combined CHF, prior CVA, hyperlipidemia, A. fib on Eliquis, COPD presented with left leg wound infection/cellulitis, hyperkalemia and acute kidney injury.  Patient has had worsening pain and redness of the left lower extremity for the last 3 days.  She was on Bactrim as an outpatient but she stopped last week due to nausea and vomiting.  She was admitted on vancomycin and cefepime.  Assessment & Plan:   Left lower extremity cellulitis in the setting of chronic venous insufficiency and chronic edema -Continue zyvox and cefepime.  Cultures negative so far.  She appears to have failed Bactrim as an outpatient -Wound care evaluation appreciated  Bullous skin lesions -these can definitely be caused by acute on chronic lower extremity swelling however it would not explain the lesion that she noticed in her mouth last time she was in the hospital.  Need to rule out autoimmune skin conditions -Status post skin biopsy by general surgery on 06/02/2019.  Follow pathology. -Might need outpatient dermatology follow-up  Acute kidney injury on chronic renal disease stage IIIb -Baseline creatinine 1.7 in March to 121 but recently around 2.5 -Creatinine 4.02 on admission; treated with IV fluids and subsequently discontinued -Creatinine 2.99 today.  Monitor  Acute on chronic combined CHF -Most recent 2D echo in February 2 on 21 showed EF of 25 to 30% with grade 3 diastolic dysfunction -Strict input and output.  Daily weights.  Fluid restriction -Negative balance of 1999 cc since admission.  IV Lasix held on 06/03/2019.  Because of slight bump in creatinine.  Restart oral Lasix today.  Intermittent nausea and  vomiting -Still complaining of nausea despite being on Zofran.  LFTs normal.  Continue Protonix twice a day and as needed Maalox.  X-ray of the abdomen unremarkable.  Right upper quadrant ultrasound showed cholelithiasis and could not told cholecystitis.  HIDA scan report is pending.  If HIDA scan is positive, will consult general surgery.  Coronary artery disease/PAD -no chest pain, no concern for ACS.    Continue Imdur.  Hold Plavix in case patient needs gallbladder surgery.  Type 2 diabetes mellitus -her most recent A1c was 7.1, her Levemir has been held on admission due to low CBGs, placed on sliding scale.  -Continue CBGs with SSI  Permanent A. Fib -continue metoprolol for rate control; currently on Eliquis.  We will switch to heparin  Hyperlipidemia -continue atorvastatin  Essential hypertension -continue metoprolol, she was started on a lower dose than home due to heart rate into the 60s in the ED  Generalized deconditioning -PT recommends SNF placement.  Social worker consulted  DVT prophylaxis: Switch to heparin today. Code Status: Full Family Communication: Patient at bedside Disposition Plan: Status is: Inpatient  Remains inpatient appropriate because:IV treatments appropriate due to intensity of illness or inability to take PO.  Still has intermittent nausea with very poor oral intake.  Possibility of acute cholecystitis and HIDA scan is pending.  Dispo: The patient is from: Home              Anticipated d/c is to: SNF              Anticipated d/c date is: 1-3 days  Patient currently is not medically stable to d/c.   Consultants: General surgery Procedures: Skin biopsy on 06/02/2019  Antimicrobials:  Anti-infectives (From admission, onward)   Start     Dose/Rate Route Frequency Ordered Stop   06/01/19 1800  ceFEPIme (MAXIPIME) 2 g in sodium chloride 0.9 % 100 mL IVPB     2 g 200 mL/hr over 30 Minutes Intravenous Every 24 hours 05/31/19 1932      06/01/19 1115  linezolid (ZYVOX) IVPB 600 mg     600 mg 300 mL/hr over 60 Minutes Intravenous Every 12 hours 06/01/19 1028     05/31/19 1932  vancomycin variable dose per unstable renal function (pharmacist dosing)  Status:  Discontinued      Does not apply See admin instructions 05/31/19 1932 06/01/19 1028   05/31/19 1730  vancomycin (VANCOREADY) IVPB 1750 mg/350 mL     1,750 mg 175 mL/hr over 120 Minutes Intravenous  Once 05/31/19 1718 05/31/19 2046   05/31/19 1730  ceFEPIme (MAXIPIME) 2 g in sodium chloride 0.9 % 100 mL IVPB     2 g 200 mL/hr over 30 Minutes Intravenous  Once 05/31/19 1718 05/31/19 1822       Subjective: Patient seen and examined at bedside.  Still still complains of intermittent nausea.  Still has very poor oral intake.  No overnight fever.  No worsening shortness of breath or chest pain.  Objective: Vitals:   06/03/19 1600 06/03/19 2000 06/03/19 2345 06/04/19 0400  BP: (!) 117/55 (!) 117/59 (!) 122/52 (!) 123/50  Pulse: 70 63 62 60  Resp: 14 10 15 14   Temp: (!) 97.5 F (36.4 C) 97.6 F (36.4 C) 97.8 F (36.6 C) 97.6 F (36.4 C)  TempSrc: Oral Oral Oral Oral  SpO2: 99% 95% 97% 97%  Weight:      Height:        Intake/Output Summary (Last 24 hours) at 06/04/2019 0800 Last data filed at 06/04/2019 0433 Gross per 24 hour  Intake --  Output 724 ml  Net -724 ml   Filed Weights   05/31/19 1703  Weight: 83.9 kg    Examination:  General exam: No distress.  Poor historian.  Chronically ill looking. Respiratory system: Bilateral decreased breath sounds at bases with some basilar crackles.   Cardiovascular system: S1-S2 heard, rate controlled Gastrointestinal system: Abdomen is nondistended, soft and mildly tender in the epigastric and right upper quadrant regions.  Bowel sounds are heard.   Extremities: No cyanosis, clubbing; bilateral lower extremity 2+ edema present.  Left foot dressing present.  Right lower extremity dressing present. Central nervous  system: Alert and oriented.  Poor historian.  No focal neurological deficits. Moving extremities Skin: No other ulcers or lesions  psychiatry: Looks very anxious   Data Reviewed: I have personally reviewed following labs and imaging studies  CBC: Recent Labs  Lab 05/31/19 1743 06/01/19 0153 06/02/19 0200 06/03/19 0335 06/04/19 0506  WBC 9.9 9.5 9.2 10.7* 10.1  NEUTROABS 7.3  --   --   --   --   HGB 9.2* 8.5* 8.5* 8.2* 8.5*  HCT 30.9* 28.9* 28.9* 28.1* 28.7*  MCV 75.6* 75.1* 74.5* 74.5* 74.5*  PLT 404* 347 301 292 756   Basic Metabolic Panel: Recent Labs  Lab 05/31/19 1743 05/31/19 1924 06/01/19 0153 06/02/19 0200 06/03/19 0335 06/04/19 0506  NA 133*  --  135 133* 132* 134*  K 6.4*  --  5.1 4.9 4.6 4.8  CL 102  --  107 105 104  103  CO2 18*  --  15* 14* 17* 17*  GLUCOSE 148*  --  78 148* 141* 134*  BUN 82*  --  74* 71* 73* 73*  CREATININE 4.02*  --  3.37* 2.98* 3.09* 2.99*  CALCIUM 8.9  --  8.0* 8.9 8.7* 8.7*  MG  --  2.1  --  2.1 1.9 2.1  PHOS  --   --   --  4.9*  --   --    GFR: Estimated Creatinine Clearance: 18.2 mL/min (A) (by C-G formula based on SCr of 2.99 mg/dL (H)). Liver Function Tests: Recent Labs  Lab 05/31/19 1743 06/01/19 0153 06/02/19 0200 06/03/19 0335 06/04/19 0506  AST 32 29 24 23 24   ALT 20 17 15 13 13   ALKPHOS 121 96 99 92 86  BILITOT 1.2 0.9 0.8 1.1 1.2  PROT 6.6 5.7* 6.3* 6.6 6.1*  ALBUMIN 2.9* 2.5* 2.7* 2.5* 2.4*   No results for input(s): LIPASE, AMYLASE in the last 168 hours. No results for input(s): AMMONIA in the last 168 hours. Coagulation Profile: No results for input(s): INR, PROTIME in the last 168 hours. Cardiac Enzymes: No results for input(s): CKTOTAL, CKMB, CKMBINDEX, TROPONINI in the last 168 hours. BNP (last 3 results) No results for input(s): PROBNP in the last 8760 hours. HbA1C: No results for input(s): HGBA1C in the last 72 hours. CBG: Recent Labs  Lab 06/03/19 0411 06/03/19 0819 06/03/19 1214  06/03/19 1708 06/03/19 2212  GLUCAP 147* 140* 154* 131* 130*   Lipid Profile: No results for input(s): CHOL, HDL, LDLCALC, TRIG, CHOLHDL, LDLDIRECT in the last 72 hours. Thyroid Function Tests: No results for input(s): TSH, T4TOTAL, FREET4, T3FREE, THYROIDAB in the last 72 hours. Anemia Panel: No results for input(s): VITAMINB12, FOLATE, FERRITIN, TIBC, IRON, RETICCTPCT in the last 72 hours. Sepsis Labs: Recent Labs  Lab 05/31/19 1744 05/31/19 1918 06/01/19 0153  LATICACIDVEN 2.6* 1.9 2.2*    Recent Results (from the past 240 hour(s))  Blood culture (routine x 2)     Status: None (Preliminary result)   Collection Time: 05/31/19  5:18 PM   Specimen: BLOOD RIGHT ARM  Result Value Ref Range Status   Specimen Description BLOOD RIGHT ARM  Final   Special Requests   Final    BOTTLES DRAWN AEROBIC AND ANAEROBIC Blood Culture adequate volume   Culture   Final    NO GROWTH 4 DAYS Performed at [facility], [address] [place], [address] [place]    Report Status PENDING  Incomplete  Blood culture (routine x 2)     Status: None (Preliminary result)   Collection Time: 05/31/19  5:23 PM   Specimen: BLOOD RIGHT HAND  Result Value Ref Range Status   Specimen Description BLOOD RIGHT HAND  Final   Special Requests   Final    BOTTLES DRAWN AEROBIC AND ANAEROBIC Blood Culture results may not be optimal due to an inadequate volume of blood received in culture bottles   Culture   Final    NO GROWTH 4 DAYS Performed at [facility], [address], [place], [address] [place]    Report Status PENDING  Incomplete  Respiratory Panel by RT PCR (Flu A&B, Covid) - Nasopharyngeal Swab     Status: None   Collection Time: 05/31/19  6:45 PM   Specimen: Nasopharyngeal Swab  Result Value Ref Range Status   SARS Coronavirus 2 by RT PCR NEGATIVE NEGATIVE Final    Comment: (NOTE) SARS-CoV-2 target nucleic  acids are NOT DETECTED. The SARS-CoV-2 RNA is generally detectable in upper  respiratoy specimens during the acute phase of infection. The lowest concentration of SARS-CoV-2 viral copies this assay can detect is 131 copies/mL. A negative result does not preclude SARS-Cov-2 infection and should not be used as the sole basis for treatment or other patient management decisions. A negative result may occur with  improper specimen collection/handling, submission of specimen other than nasopharyngeal swab, presence of viral mutation(s) within the areas targeted by this assay, and inadequate number of viral copies (<131 copies/mL). A negative result must be combined with clinical observations, patient history, and epidemiological information. The expected result is Negative. Fact Sheet for Patients:  https://www.moore.com/ Fact Sheet for Healthcare Providers:  https://www.young.biz/ This test is not yet ap proved or cleared by the [place] FDA and  has been authorized for detection and/or diagnosis of SARS-CoV-2 by FDA under an Emergency Use Authorization (EUA). This EUA will remain  in effect (meaning this test can be used) for the duration of the COVID-19 declaration under Section 564(b)(1) of the Act, 21 U.S.C. section 360bbb-3(b)(1), unless the authorization is terminated or revoked sooner.    Influenza A by PCR NEGATIVE NEGATIVE Final   Influenza B by PCR NEGATIVE NEGATIVE Final    Comment: (NOTE) The Xpert Xpress SARS-CoV-2/FLU/RSV assay is intended as an aid in  the diagnosis of influenza from Nasopharyngeal swab specimens and  should not be used as a sole basis for treatment. Nasal washings and  aspirates are unacceptable for Xpert Xpress SARS-CoV-2/FLU/RSV  testing. Fact Sheet for Patients: https://www.moore.com/ Fact Sheet for Healthcare Providers: https://www.young.biz/ This test is not yet approved or cleared by the [place] FDA and  has been authorized for  detection and/or diagnosis of SARS-CoV-2 by  FDA under an Emergency Use Authorization (EUA). This EUA will remain  in effect (meaning this test can be used) for the duration of the  Covid-19 declaration under Section 564(b)(1) of the Act, 21  U.S.C. section 360bbb-3(b)(1), unless the authorization is  terminated or revoked. Performed at [facility], [address] [place], [place] [address]          Radiology Studies: DG Abd 2 Views  Result Date: 06/03/2019 CLINICAL DATA:  Nausea and vomiting. Cholelithiasis. EXAM: ABDOMEN - 2 VIEW COMPARISON:  11/03/2015. Abdomen and pelvis CT dated 05/04/2019. Portable chest dated 04/24/2019 FINDINGS: Normal bowel gas pattern. Pelvic surgical clips. Arterial calcifications and bilateral iliac stents. Lumbar spine degenerative changes and mild rotary scoliosis. Enlarged heart and prominent interstitial markings with no pleural fluid seen, similar to 04/24/1998. IMPRESSION: No acute abnormality. Electronically Signed   By: Beckie Salts M.D.   On: 06/03/2019 13:10   US Abdomen Limited RUQ  Result Date: 06/03/2019 CLINICAL DATA:  Nausea and vomiting EXAM: ULTRASOUND ABDOMEN LIMITED RIGHT UPPER QUADRANT COMPARISON:  CT abdomen and pelvis May 03, 2020 FINDINGS: Gallbladder: Within the gallbladder, there are echogenic foci which move and shadow consistent with cholelithiasis. Largest gallstone measures 6 mm in length. There is also sludge in the gallbladder. The gallbladder wall is borderline thickened. No pericholecystic fluid. No sonographic Murphy sign noted by sonographer. Common bile duct: Diameter: 5 mm. No intrahepatic or extrahepatic biliary duct dilatation. Liver: No focal lesion identified. Within normal limits in parenchymal echogenicity. Portal vein is patent on color Doppler imaging with normal direction of blood flow towards the liver. Other: There is mild ascites. There is also a right pleural effusion. IMPRESSION: 1. Cholelithiasis with mild  sludge in the gallbladder. Gallbladder wall is borderline thickened. This finding may be due to ascites which is noted. A degree of cholecystitis could present in this manner, however. This finding may warrant correlation with nuclear medicine hepatobiliary imaging study to assess for cystic duct patency. 2.  Right pleural effusion.  Mild ascites. 3.  Study otherwise unremarkable. Electronically Signed   By: Bretta Bang III M.D.   On: 06/03/2019 14:28        Scheduled Meds: . apixaban  5 mg Oral BID  . atorvastatin  80 mg Oral QHS  . clopidogrel  75 mg Oral Daily  . insulin aspart  0-5 Units Subcutaneous QHS  . insulin aspart  0-9 Units Subcutaneous TID WC  . isosorbide mononitrate  90 mg Oral Daily  . metoprolol succinate  100 mg Oral Daily  . ondansetron (ZOFRAN) IV  4 mg Intravenous Q6H  . pantoprazole  40 mg Oral BID   Continuous Infusions: . ceFEPime (MAXIPIME) IV 2 g (06/03/19 1807)  . linezolid (ZYVOX) IV 600 mg (06/03/19 2218)          Glade Lloyd, MD [facility] 06/04/2019, 8:00 AM

## 2019-06-04 NOTE — Progress Notes (Signed)
0450: Pt has not voided all night. Bladder scanned pt and was acknowledged. In and out cath was performed and of urine was received. Bladder scan recheck showed . On call provider was notified of findings. No new orders acknowledged.

## 2019-06-04 NOTE — Progress Notes (Signed)
ANTICOAGULATION CONSULT NOTE - Initial Consult  Pharmacy Consult:  Heparin Indication: atrial fibrillation  Allergies  Allergen Reactions  . Bactrim [Sulfamethoxazole-Trimethoprim] Nausea And Vomiting  . Doxycycline Other (See Comments)    Lethargy   . Hydrochlorothiazide Other (See Comments)    Lethargy   . Latex Rash  . Penicillins Swelling and Rash    Pt states she has tolerated Keflex in the past without problems. States she may have tolerated Augmentin in the past but it caused GI upset. Has patient had a PCN reaction causing immediate rash, facial/tongue/throat swelling, SOB or lightheadedness with hypotension: Yes Has patient had a PCN reaction causing severe rash involving mucus membranes or skin necrosis: No Has patient had a PCN reaction that required hospitalization No Has patient had a PCN reaction occurring within the last 10 years: No    Patient Measurements: Height: 5\' 3"  (160 cm) Weight: 83.9 kg (185 lb) IBW/kg (Calculated) : 52.4 Heparin Dosing Weight: 71 kg  Vital Signs: Temp: 97.6 F (36.4 C) (05/07 0400) Temp Source: Oral (05/07 0400) BP: 123/50 (05/07 0400) Pulse Rate: 60 (05/07 0400)  Labs: Recent Labs    06/02/19 0200 06/02/19 0200 06/03/19 0335 06/04/19 0506  HGB 8.5*   < > 8.2* 8.5*  HCT 28.9*  --  28.1* 28.7*  PLT 301  --  292 286  CREATININE 2.98*  --  3.09* 2.99*   < > = values in this interval not displayed.    Estimated Creatinine Clearance: 18.2 mL/min (A) (by C-G formula based on SCr of 2.99 mg/dL (H)).   Medical History: Past Medical History:  Diagnosis Date  . AKI (acute kidney injury) (HCC) 05/04/2019  . Anemia 10/2015   Acute Blood Loss  . Arthritis    "feel like I have it all over" (08/28/2015)  . Cellulitis and abscess of foot 04/26/2019   left lower extremity  . CHF (congestive heart failure) (HCC)   . Complication of anesthesia    DIFFICULT WAKING "only when I was smoking; no problems since I quit"  . Coronary  artery disease   . Family history of adverse reaction to anesthesia    sister slow to wake up  . Gangrene of left foot (HCC) 10/17/2016  . Gangrene of right foot (HCC) 11/01/2016  . GERD (gastroesophageal reflux disease)    takes Protonix daily   . Hip bursitis   . History of blood transfusion    10/2015  . Hyperlipidemia LDL goal < 70 06/28/2013   takes Atorvastatin daily  . Hypertension    takes Metoprolol and Imdur daily  . Hypoxia 01/2018  . Malnutrition (HCC)   . Migraine    "none in a long time" (08/28/2015)  . Myocardial infarction (HCC) 2011  . Neuromuscular disorder (HCC)    DIABETIC NEUROPATHY  . Osteomyelitis (HCC) 2017   Left foot  . PAD (peripheral artery disease) (HCC)   . Peripheral vascular disease (HCC)   . Respiratory failure (HCC) 10/2015   Acute Hypoxia- acute pulmonary edema 11/13/2015  . Sepsis (HCC) 10/17/2016  . Septic shock (HCC) 10/2015  . Stroke (HCC)   . Type II diabetes mellitus (HCC)    takes Lantus nightly.Average fasting blood sugar runs 80-90  Type II    Assessment: 43 YOF with history of Afib on Eliquis, last dose 06/03/19 ~2200.  Pharmacy has been consulted to transition patient to IV heparin in case she needs bladder surgery.  Patient has CKD; no bleeding reported.  Goal of Therapy:  Heparin  level 0.3-0.7 units/ml aPTT 66-102 seconds Monitor platelets by anticoagulation protocol: Yes   Plan:  Stat baseline aPTT and heparin level  At 1000, start IV heparin at 1000 units/hr, no bolus with recent Eliquis Check 8 hr aPTT Daily heparin level, aPTT and CBC  Earlisha Sharples D. Mina Marble, PharmD, BCPS, [place] 06/04/2019, 8:29 AM

## 2019-06-05 DIAGNOSIS — L03116 Cellulitis of left lower limb: Secondary | ICD-10-CM | POA: Diagnosis not present

## 2019-06-05 DIAGNOSIS — I482 Chronic atrial fibrillation, unspecified: Secondary | ICD-10-CM | POA: Diagnosis not present

## 2019-06-05 DIAGNOSIS — I499 Cardiac arrhythmia, unspecified: Secondary | ICD-10-CM | POA: Diagnosis not present

## 2019-06-05 DIAGNOSIS — N183 Chronic kidney disease, stage 3 unspecified: Secondary | ICD-10-CM | POA: Diagnosis not present

## 2019-06-05 DIAGNOSIS — K219 Gastro-esophageal reflux disease without esophagitis: Secondary | ICD-10-CM | POA: Diagnosis not present

## 2019-06-05 DIAGNOSIS — I10 Essential (primary) hypertension: Secondary | ICD-10-CM | POA: Diagnosis not present

## 2019-06-05 DIAGNOSIS — R000 Tachycardia, unspecified: Secondary | ICD-10-CM | POA: Diagnosis not present

## 2019-06-05 DIAGNOSIS — Z7901 Long term (current) use of anticoagulants: Secondary | ICD-10-CM | POA: Diagnosis not present

## 2019-06-05 DIAGNOSIS — I130 Hypertensive heart and chronic kidney disease with heart failure and stage 1 through stage 4 chronic kidney disease, or unspecified chronic kidney disease: Secondary | ICD-10-CM | POA: Diagnosis not present

## 2019-06-05 DIAGNOSIS — L03119 Cellulitis of unspecified part of limb: Secondary | ICD-10-CM | POA: Diagnosis not present

## 2019-06-05 DIAGNOSIS — R404 Transient alteration of awareness: Secondary | ICD-10-CM | POA: Diagnosis not present

## 2019-06-05 DIAGNOSIS — M6281 Muscle weakness (generalized): Secondary | ICD-10-CM | POA: Diagnosis not present

## 2019-06-05 DIAGNOSIS — R5381 Other malaise: Secondary | ICD-10-CM | POA: Diagnosis not present

## 2019-06-05 DIAGNOSIS — I5022 Chronic systolic (congestive) heart failure: Secondary | ICD-10-CM | POA: Diagnosis not present

## 2019-06-05 DIAGNOSIS — Z7401 Bed confinement status: Secondary | ICD-10-CM | POA: Diagnosis not present

## 2019-06-05 DIAGNOSIS — N1832 Chronic kidney disease, stage 3b: Secondary | ICD-10-CM | POA: Diagnosis not present

## 2019-06-05 DIAGNOSIS — E1165 Type 2 diabetes mellitus with hyperglycemia: Secondary | ICD-10-CM | POA: Diagnosis not present

## 2019-06-05 DIAGNOSIS — I87022 Postthrombotic syndrome with inflammation of left lower extremity: Secondary | ICD-10-CM | POA: Diagnosis not present

## 2019-06-05 DIAGNOSIS — R0689 Other abnormalities of breathing: Secondary | ICD-10-CM | POA: Diagnosis not present

## 2019-06-05 DIAGNOSIS — E1142 Type 2 diabetes mellitus with diabetic polyneuropathy: Secondary | ICD-10-CM | POA: Diagnosis not present

## 2019-06-05 DIAGNOSIS — I469 Cardiac arrest, cause unspecified: Secondary | ICD-10-CM | POA: Diagnosis not present

## 2019-06-05 DIAGNOSIS — I480 Paroxysmal atrial fibrillation: Secondary | ICD-10-CM | POA: Diagnosis not present

## 2019-06-05 DIAGNOSIS — R5383 Other fatigue: Secondary | ICD-10-CM | POA: Diagnosis not present

## 2019-06-05 DIAGNOSIS — J449 Chronic obstructive pulmonary disease, unspecified: Secondary | ICD-10-CM | POA: Diagnosis not present

## 2019-06-05 DIAGNOSIS — N179 Acute kidney failure, unspecified: Secondary | ICD-10-CM | POA: Diagnosis not present

## 2019-06-05 DIAGNOSIS — I739 Peripheral vascular disease, unspecified: Secondary | ICD-10-CM | POA: Diagnosis not present

## 2019-06-05 DIAGNOSIS — E1122 Type 2 diabetes mellitus with diabetic chronic kidney disease: Secondary | ICD-10-CM | POA: Diagnosis not present

## 2019-06-05 DIAGNOSIS — Z79899 Other long term (current) drug therapy: Secondary | ICD-10-CM | POA: Diagnosis not present

## 2019-06-05 DIAGNOSIS — E785 Hyperlipidemia, unspecified: Secondary | ICD-10-CM | POA: Diagnosis not present

## 2019-06-05 DIAGNOSIS — L089 Local infection of the skin and subcutaneous tissue, unspecified: Secondary | ICD-10-CM | POA: Diagnosis not present

## 2019-06-05 DIAGNOSIS — I4821 Permanent atrial fibrillation: Secondary | ICD-10-CM | POA: Diagnosis not present

## 2019-06-05 DIAGNOSIS — Z794 Long term (current) use of insulin: Secondary | ICD-10-CM | POA: Diagnosis not present

## 2019-06-05 DIAGNOSIS — I5043 Acute on chronic combined systolic (congestive) and diastolic (congestive) heart failure: Secondary | ICD-10-CM | POA: Diagnosis not present

## 2019-06-05 DIAGNOSIS — I2584 Coronary atherosclerosis due to calcified coronary lesion: Secondary | ICD-10-CM | POA: Diagnosis not present

## 2019-06-05 DIAGNOSIS — R4020 Unspecified coma: Secondary | ICD-10-CM | POA: Diagnosis not present

## 2019-06-05 DIAGNOSIS — I5021 Acute systolic (congestive) heart failure: Secondary | ICD-10-CM | POA: Diagnosis not present

## 2019-06-05 DIAGNOSIS — E875 Hyperkalemia: Secondary | ICD-10-CM | POA: Diagnosis not present

## 2019-06-05 DIAGNOSIS — M2550 Pain in unspecified joint: Secondary | ICD-10-CM | POA: Diagnosis not present

## 2019-06-05 LAB — CBC
HCT: 26.6 % — ABNORMAL LOW (ref 36.0–46.0)
Hemoglobin: 7.9 g/dL — ABNORMAL LOW (ref 12.0–15.0)
MCH: 21.8 pg — ABNORMAL LOW (ref 26.0–34.0)
MCHC: 29.7 g/dL — ABNORMAL LOW (ref 30.0–36.0)
MCV: 73.5 fL — ABNORMAL LOW (ref 80.0–100.0)
Platelets: 247 10*3/uL (ref 150–400)
RBC: 3.62 MIL/uL — ABNORMAL LOW (ref 3.87–5.11)
RDW: 19.8 % — ABNORMAL HIGH (ref 11.5–15.5)
WBC: 8.9 10*3/uL (ref 4.0–10.5)
nRBC: 1.5 % — ABNORMAL HIGH (ref 0.0–0.2)

## 2019-06-05 LAB — CULTURE, BLOOD (ROUTINE X 2)
Culture: NO GROWTH
Culture: NO GROWTH
Special Requests: ADEQUATE

## 2019-06-05 LAB — GLUCOSE, CAPILLARY
Glucose-Capillary: 123 mg/dL — ABNORMAL HIGH (ref 70–99)
Glucose-Capillary: 134 mg/dL — ABNORMAL HIGH (ref 70–99)
Glucose-Capillary: 163 mg/dL — ABNORMAL HIGH (ref 70–99)

## 2019-06-05 LAB — COMPREHENSIVE METABOLIC PANEL
ALT: 12 U/L (ref 0–44)
AST: 22 U/L (ref 15–41)
Albumin: 2.1 g/dL — ABNORMAL LOW (ref 3.5–5.0)
Alkaline Phosphatase: 73 U/L (ref 38–126)
Anion gap: 14 (ref 5–15)
BUN: 72 mg/dL — ABNORMAL HIGH (ref 8–23)
CO2: 15 mmol/L — ABNORMAL LOW (ref 22–32)
Calcium: 8.4 mg/dL — ABNORMAL LOW (ref 8.9–10.3)
Chloride: 104 mmol/L (ref 98–111)
Creatinine, Ser: 2.79 mg/dL — ABNORMAL HIGH (ref 0.44–1.00)
GFR calc Af Amer: 19 mL/min — ABNORMAL LOW (ref >60)
GFR calc non Af Amer: 17 mL/min — ABNORMAL LOW (ref >60)
Glucose, Bld: 135 mg/dL — ABNORMAL HIGH (ref 70–99)
Potassium: 4.2 mmol/L (ref 3.5–5.1)
Sodium: 133 mmol/L — ABNORMAL LOW (ref 135–145)
Total Bilirubin: 1.1 mg/dL (ref 0.3–1.2)
Total Protein: 5.8 g/dL — ABNORMAL LOW (ref 6.5–8.1)

## 2019-06-05 LAB — MAGNESIUM: Magnesium: 2 mg/dL (ref 1.7–2.4)

## 2019-06-05 LAB — SARS CORONAVIRUS 2 (TAT 6-24 HRS): SARS Coronavirus 2: NEGATIVE

## 2019-06-05 LAB — HEPARIN LEVEL (UNFRACTIONATED): Heparin Unfractionated: >2.2 IU/mL — ABNORMAL HIGH (ref 0.30–0.70)

## 2019-06-05 LAB — APTT: aPTT: 63 seconds — ABNORMAL HIGH (ref 24–36)

## 2019-06-05 MED ORDER — APIXABAN 5 MG PO TABS
5.0000 mg | ORAL_TABLET | Freq: Two times a day (BID) | ORAL | Status: DC
  Administered 2019-06-05: 5 mg via ORAL
  Filled 2019-06-05: qty 1

## 2019-06-05 MED ORDER — ACETAMINOPHEN 500 MG PO TABS: 1000.0000 mg | ORAL_TABLET | Freq: Four times a day (QID) | ORAL | Status: AC | PRN

## 2019-06-05 MED ORDER — CHLORHEXIDINE GLUCONATE CLOTH 2 % EX PADS
6.0000 | MEDICATED_PAD | Freq: Every day | CUTANEOUS | Status: DC
  Administered 2019-06-05: 6 via TOPICAL

## 2019-06-05 MED ORDER — ALUM & MAG HYDROXIDE-SIMETH 200-200-20 MG/5ML PO SUSP: 15.0000 mL | Freq: Four times a day (QID) | ORAL | 0 refills | Status: AC | PRN

## 2019-06-05 MED ORDER — METOPROLOL SUCCINATE ER 100 MG PO TB24: 100.0000 mg | ORAL_TABLET | Freq: Every day | ORAL | Status: AC

## 2019-06-05 MED ORDER — PANTOPRAZOLE SODIUM 40 MG PO TBEC: 40.0000 mg | DELAYED_RELEASE_TABLET | Freq: Two times a day (BID) | ORAL | Status: AC

## 2019-06-05 MED ORDER — CEPHALEXIN 250 MG PO CAPS: 250.0000 mg | ORAL_CAPSULE | Freq: Two times a day (BID) | ORAL | 0 refills | Status: AC

## 2019-06-05 MED ORDER — ONDANSETRON HCL 4 MG PO TABS: 4.0000 mg | ORAL_TABLET | Freq: Three times a day (TID) | ORAL | 0 refills | Status: AC | PRN

## 2019-06-05 MED ORDER — TRAMADOL HCL 50 MG PO TABS: 50.0000 mg | ORAL_TABLET | Freq: Two times a day (BID) | ORAL | 0 refills | Status: AC | PRN

## 2019-06-05 NOTE — Progress Notes (Addendum)
1600 Contacted SW Joey and he relayed room number and SNF. PTAR still pending. Report called and given to [facility] for RM 107A. Discussed PMH, Current DX, Meds, wound care, follow up care, AVS to go with patient. Will update with PTAR timeframe. Gabriel Cirri RN   1730: PTAR here to transport patient. Foley remains. PIV's removed. BLE Dressings reinforced. Daughter present and asked questions of how to reach the SNF. All belongings verified with patient by daughter. Gabriel Cirri RN

## 2019-06-05 NOTE — TOC Transition Note (Signed)
Transition of Care [facility]) - CM/SW Discharge Note   Patient Details  Name: [name] [name] MRN: [id number] Date of Birth: September 12, 1950  [facility] CM/SW Contact:  Annalee Genta, LCSW Phone Number: 06/05/2019, 11:00 AM   Clinical Narrative: Patient will discharge to: [facility] Care Discharge date: 06/05/2019 Family notified: daughter Transport by: Sharin Mons  Per MD patient is appropriate for discharge and will discharge to [facility] in room 107A. RN, patient, patient's family, and facility have been notified of discharge. Assessment, FL-2, PASRR, and discharge summary sent to facillity. RN was provided with the following number for report: [phone]. PTAR will be used to transfer patient to the facility. CSW will continue to follow for any discharge supports.      Barriers to Discharge: Barriers Resolved   Patient Goals and CMS Choice Patient states their goals for this hospitalization and ongoing recovery are:: to be independent at home CMS Medicare.gov Compare Post Acute Care list provided to:: Patient Choice offered to / list presented to : Patient, Adult Children  Discharge Placement                       Discharge Plan and Services                                     Social Determinants of Health (SDOH) Interventions     Readmission Risk Interventions Readmission Risk Prevention Plan 05/06/2019  Transportation Screening Complete  Medication Review Oceanographer) Complete  HRI or Home Care Consult Complete  SW Recovery Care/Counseling Consult Complete  Palliative Care Screening Not Applicable  Skilled Nursing Facility Complete  Some recent data might be hidden

## 2019-06-05 NOTE — Discharge Summary (Signed)
Physician Discharge Summary  KEARY HANAK JJK:[id number] DOB: 1950-11-27 DOA: 05/31/2019  PCP: Georgann Housekeeper, MD  Admit date: 05/31/2019 Discharge date: 06/05/2019  Admitted From: Home Disposition: SNF  Recommendations for Outpatient Follow-up:  1. Follow up with SNF provider at earliest convenience with repeat CBC/CMP in the next few days 2. Recommend outpatient evaluation and follow-up by dermatology 3. Outpatient follow-up with cardiology and general surgery 4. Follow-up in the ED if symptoms worsen or new appear   Home Health: No Equipment/Devices: None  Discharge Condition: Stable CODE STATUS: Full Diet recommendation: Heart healthy/carb modified/fluid restriction  Brief/Interim Summary: 69 year old female with history of DM2, HTN, chronic kidney disease, coronary artery disease, PVD, chronic combined CHF, prior CVA, hyperlipidemia, A. fib on Eliquis, COPD presented with left leg wound infection/cellulitis, hyperkalemia and acute kidney injury.  Patient has had worsening pain and redness of the left lower extremity for the last 3 days.  She was on Bactrim as an outpatient but she stopped last week due to nausea and vomiting.  She was admitted on vancomycin and cefepime.  In the hospitalization, she had skin biopsy done by general surgery on 06/02/2019 and the pathology showed lichenoid reaction.  She will need outpatient dermatology follow-up.  She had intermittent nausea and vomiting for which she ultimately ended up having HIDA scan which showed biliary dyskinesia for which general surgery recommended outpatient follow-up.  PT recommended SNF placement.  She will be discharged to SNF today on oral Keflex for 5 more days.   Discharge Diagnoses:   Left lower extremity cellulitis in the setting of chronic venous insufficiency and chronic edema -Currently on cefepime and Zyvox. Cultures negative so far.  She appears to have failed Bactrim as an outpatient -Wound care evaluation  appreciated -Discharge to SNF on Keflex for 5 more days.  Bullous skin lesions -these can definitely be caused by acute on chronic lower extremity swelling however it would not explain the lesion that she noticed in her mouth last time she was in the hospital.  -Status post skin biopsy by general surgery on 06/02/2019.    Pathology showed a lichenoid reaction. -We will need outpatient dermatology follow-up.  Might also need rheumatology evaluation as an outpatient  Acute kidney injury on chronic renal disease stage IIIb -Baseline creatinine 1.7 in March to 121 but recently around 2.5 -Creatinine 4.02 on admission; treated with IV fluids and subsequently discontinued -Improving to 2.79 today.  Outpatient follow-up.  Acute on chronic combined CHF -Most recent 2D echo in February 2 on 21 showed EF of 25 to 30% with grade 3 diastolic dysfunction -Low-salt diet. Fluid restriction.  Outpatient follow-up with cardiology -Patient received IV Lasix which was subsequently held because of bump in creatinine.  Oral Lasix was restarted on 06/04/2019.  Continue Lasix on discharge.  Continue Imdur and metoprolol.  Intermittent nausea and vomiting - LFTs normal.  Continue Protonix twice a day and as needed Maalox.  X-ray of the abdomen unremarkable.  Right upper quadrant ultrasound showed cholelithiasis and could not told cholecystitis.    HIDA scan showed biliary dyskinesia for which general surgery recommended outpatient follow-up.  Use Zofran as needed.  Still has some nausea but improving.  Coronary artery disease/PAD -no chest pain, no concern for ACS.   Continue Imdur.    Resume Plavix on discharge.    Type 2 diabetes mellitus -her most recent A1c was 7.1, her Levemir has been held on admission due to low CBGs.  Keep Levemir on hold for now.  Carb modified diet.  Resume home oral regimen.  Permanent A. Fib -continue metoprolol for rate control; resume Eliquis on  discharge.  Hyperlipidemia -continue atorvastatin  Essential hypertension -continue metoprolol, she was started on a lower dose than home due to heart rate into the 60s in the ED  Generalized deconditioning -PT recommends SNF placement.    Discharge Instructions  Discharge Instructions    Ambulatory referral to Cardiology   Complete by: As directed    Diet - low sodium heart healthy   Complete by: As directed    Diet Carb Modified   Complete by: As directed    Increase activity slowly   Complete by: As directed      Allergies as of 06/05/2019      Reactions   Bactrim [sulfamethoxazole-trimethoprim] Nausea And Vomiting   Doxycycline Other (See Comments)   Lethargy   Hydrochlorothiazide Other (See Comments)   Lethargy   Latex Rash   Penicillins Swelling, Rash   Pt states she has tolerated Keflex in the past without problems. States she may have tolerated Augmentin in the past but it caused GI upset. Has patient had a PCN reaction causing immediate rash, facial/tongue/throat swelling, SOB or lightheadedness with hypotension: Yes Has patient had a PCN reaction causing severe rash involving mucus membranes or skin necrosis: No Has patient had a PCN reaction that required hospitalization No Has patient had a PCN reaction occurring within the last 10 years: No      Medication List    STOP taking these medications   Levemir FlexTouch 100 UNIT/ML FlexPen Generic drug: insulin detemir     TAKE these medications   acetaminophen 500 MG tablet Commonly known as: TYLENOL Take 2 tablets (1,000 mg total) by mouth every 6 (six) hours as needed for headache (pain). What changed: how much to take   albuterol 108 (90 Base) MCG/ACT inhaler Commonly known as: VENTOLIN HFA Inhale 2 puffs into the lungs every 6 (six) hours as needed for wheezing or shortness of breath.   alum & mag hydroxide-simeth 200-200-20 MG/5ML suspension Commonly known as: MAALOX/MYLANTA Take 15 mLs by mouth  every 6 (six) hours as needed for indigestion or heartburn.   apixaban 5 MG Tabs tablet Commonly known as: ELIQUIS Take 1 tablet (5 mg total) by mouth 2 (two) times daily.   atorvastatin 80 MG tablet Commonly known as: LIPITOR Take 80 mg by mouth at bedtime.   cephALEXin 250 MG capsule Commonly known as: KEFLEX Take 1 capsule (250 mg total) by mouth 2 (two) times daily for 5 days.   clopidogrel 75 MG tablet Commonly known as: PLAVIX Take 75 mg by mouth daily.   furosemide 40 MG tablet Commonly known as: LASIX Take 40 mg by mouth 2 (two) times daily. What changed: Another medication with the same name was removed. Continue taking this medication, and follow the directions you see here.   glipiZIDE 5 MG tablet Commonly known as: GLUCOTROL Take 0.5 tablets (2.5 mg total) by mouth daily before breakfast.   isosorbide mononitrate 30 MG 24 hr tablet Commonly known as: IMDUR Take 3 tablets (90 mg total) by mouth daily. What changed: Another medication with the same name was removed. Continue taking this medication, and follow the directions you see here.   metoprolol succinate 100 MG 24 hr tablet Commonly known as: TOPROL-XL Take 1 tablet (100 mg total) by mouth daily. What changed:   how much to take  how to take this  when to take this  additional instructions  Another medication with the same name was removed. Continue taking this medication, and follow the directions you see here.   nitroGLYCERIN 0.4 MG SL tablet Commonly known as: NITROSTAT Place 0.4 mg under the tongue every 5 (five) minutes as needed for chest pain.   ondansetron 4 MG tablet Commonly known as: Zofran Take 1 tablet (4 mg total) by mouth every 8 (eight) hours as needed for nausea or vomiting.   pantoprazole 40 MG tablet Commonly known as: PROTONIX Take 1 tablet (40 mg total) by mouth 2 (two) times daily. What changed: when to take this   traMADol 50 MG tablet Commonly known as: ULTRAM Take  1 tablet (50 mg total) by mouth every 12 (twelve) hours as needed (pain). What changed: when to take this      Follow-up Information    [facility], [facility]. Call in 1 day(s).   Specialty: General Surgery Why: To schedule an appointment for follow up Contact information: [address] [place] [address] [phone]        Georgann Housekeeper, MD. Schedule an appointment as soon as possible for a visit in 1 week(s).   Specialty: Internal Medicine Why: with repeat cbc/cmp  Contact information: [address] [place] [address] [phone]        Lennette Bihari, MD .   Specialty: Cardiology Contact information: [address] [place] [address] [phone]        Dermatologist. Schedule an appointment as soon as possible for a visit in 1 week(s).          Allergies  Allergen Reactions  . Bactrim [Sulfamethoxazole-Trimethoprim] Nausea And Vomiting  . Doxycycline Other (See Comments)    Lethargy   . Hydrochlorothiazide Other (See Comments)    Lethargy   . Latex Rash  . Penicillins Swelling and Rash    Pt states she has tolerated Keflex in the past without problems. States she may have tolerated Augmentin in the past but it caused GI upset. Has patient had a PCN reaction causing immediate rash, facial/tongue/throat swelling, SOB or lightheadedness with hypotension: Yes Has patient had a PCN reaction causing severe rash involving mucus membranes or skin necrosis: No Has patient had a PCN reaction that required hospitalization No Has patient had a PCN reaction occurring within the last 10 years: No    Consultations:  General surgery   Procedures/Studies: NM Hepato W/EF  Result Date: 06/04/2019 CLINICAL DATA:  Abnormal gallbladder ultrasound EXAM: NUCLEAR MEDICINE HEPATOBILIARY IMAGING WITH GALLBLADDER EF VIEWS: Anterior right upper quadrant RADIOPHARMACEUTICALS:  5.5 mCi Tc-56m  Choletec IV COMPARISON:   None. FINDINGS: Liver uptake of radiotracer is unremarkable. There is prompt visualization of gallbladder and small bowel, indicating patency of the cystic and common bile ducts. Patient consumed 8 ounces of Ensure orally with calculation of the computer generated ejection fraction of radiotracer from the gallbladder. The patient did not experience clinical symptoms with the oral Ensure consumption. The computer generated ejection fraction of radiotracer from the gallbladder is diminished at 25%, normal greater than 33% using the oral agent. IMPRESSION: Abnormally low ejection fraction of radiotracer from the gallbladder, a finding indicative of biliary dyskinesia. Cystic and common bile ducts are patent as is evidenced by visualization of gallbladder and small bowel. Electronically Signed   By: Bretta Bang III M.D.   On: 06/04/2019 09:26   DG Abd 2 Views  Result Date: 06/03/2019 CLINICAL DATA:  Nausea and vomiting. Cholelithiasis. EXAM:  ABDOMEN - 2 VIEW COMPARISON:  11/03/2015. Abdomen and pelvis CT dated 05/04/2019. Portable chest dated 04/24/2019 FINDINGS: Normal bowel gas pattern. Pelvic surgical clips. Arterial calcifications and bilateral iliac stents. Lumbar spine degenerative changes and mild rotary scoliosis. Enlarged heart and prominent interstitial markings with no pleural fluid seen, similar to 04/24/1998. IMPRESSION: No acute abnormality. Electronically Signed   By: Beckie Salts M.D.   On: 06/03/2019 13:10   US Abdomen Limited RUQ  Result Date: 06/03/2019 CLINICAL DATA:  Nausea and vomiting EXAM: ULTRASOUND ABDOMEN LIMITED RIGHT UPPER QUADRANT COMPARISON:  CT abdomen and pelvis May 03, 2020 FINDINGS: Gallbladder: Within the gallbladder, there are echogenic foci which move and shadow consistent with cholelithiasis. Largest gallstone measures 6 mm in length. There is also sludge in the gallbladder. The gallbladder wall is borderline thickened. No pericholecystic fluid. No sonographic Murphy  sign noted by sonographer. Common bile duct: Diameter: 5 mm. No intrahepatic or extrahepatic biliary duct dilatation. Liver: No focal lesion identified. Within normal limits in parenchymal echogenicity. Portal vein is patent on color Doppler imaging with normal direction of blood flow towards the liver. Other: There is mild ascites. There is also a right pleural effusion. IMPRESSION: 1. Cholelithiasis with mild sludge in the gallbladder. Gallbladder wall is borderline thickened. This finding may be due to ascites which is noted. A degree of cholecystitis could present in this manner, however. This finding may warrant correlation with nuclear medicine hepatobiliary imaging study to assess for cystic duct patency. 2.  Right pleural effusion.  Mild ascites. 3.  Study otherwise unremarkable. Electronically Signed   By: Bretta Bang III M.D.   On: 06/03/2019 14:28       Subjective: Patient seen and examined at bedside.  Poor historian.  Feels slightly better.  No overnight fever or vomiting reported.  No worsening shortness of breath.  Discharge Exam: Vitals:   06/05/19 0734 06/05/19 0753  BP: (!) 79/56 (!) 123/50  Pulse: 61 62  Resp: (!) 9 (!) 9  Temp: (!) 97.5 F (36.4 C) 97.7 F (36.5 C)  SpO2: 95% 98%    General: Pt is alert, awake, not in acute distress Cardiovascular: rate controlled, S1/S2 + Respiratory: bilateral decreased breath sounds at bases with some scattered crackles Abdominal: Soft, NT, ND, bowel sounds + Extremities: No cyanosis, clubbing; bilateral lower extremity edema present.  Left foot dressing present.  Right lower extremity dressing present.   The results of significant diagnostics from this hospitalization (including imaging, microbiology, ancillary and laboratory) are listed below for reference.     Microbiology: Recent Results (from the past 240 hour(s))  Blood culture (routine x 2)     Status: None   Collection Time: 05/31/19  5:18 PM   Specimen: BLOOD  RIGHT ARM  Result Value Ref Range Status   Specimen Description BLOOD RIGHT ARM  Final   Special Requests   Final    BOTTLES DRAWN AEROBIC AND ANAEROBIC Blood Culture adequate volume   Culture   Final    NO GROWTH 5 DAYS Performed at [facility], [address], [place], [place] [address]    Report Status 06/05/2019 FINAL  Final  Blood culture (routine x 2)     Status: None   Collection Time: 05/31/19  5:23 PM   Specimen: BLOOD RIGHT HAND  Result Value Ref Range Status   Specimen Description BLOOD RIGHT HAND  Final   Special Requests   Final    BOTTLES DRAWN AEROBIC AND ANAEROBIC Blood Culture results may  not be optimal due to an inadequate volume of blood received in culture bottles   Culture   Final    NO GROWTH 5 DAYS Performed at [facility], [address], [place], [place] [address]    Report Status 06/05/2019 FINAL  Final  Respiratory Panel by RT PCR (Flu A&B, Covid) - Nasopharyngeal Swab     Status: None   Collection Time: 05/31/19  6:45 PM   Specimen: Nasopharyngeal Swab  Result Value Ref Range Status   SARS Coronavirus 2 by RT PCR NEGATIVE NEGATIVE Final    Comment: (NOTE) SARS-CoV-2 target nucleic acids are NOT DETECTED. The SARS-CoV-2 RNA is generally detectable in upper respiratoy specimens during the acute phase of infection. The lowest concentration of SARS-CoV-2 viral copies this assay can detect is 131 copies/mL. A negative result does not preclude SARS-Cov-2 infection and should not be used as the sole basis for treatment or other patient management decisions. A negative result may occur with  improper specimen collection/handling, submission of specimen other than nasopharyngeal swab, presence of viral mutation(s) within the areas targeted by this assay, and inadequate number of viral copies (<131 copies/mL). A negative result must be combined with clinical observations, patient history, and epidemiological information. The expected result is  Negative. Fact Sheet for Patients:  https://www.moore.com/ Fact Sheet for Healthcare Providers:  https://www.young.biz/ This test is not yet ap proved or cleared by the [place] FDA and  has been authorized for detection and/or diagnosis of SARS-CoV-2 by FDA under an Emergency Use Authorization (EUA). This EUA will remain  in effect (meaning this test can be used) for the duration of the COVID-19 declaration under Section 564(b)(1) of the Act, 21 U.S.C. section 360bbb-3(b)(1), unless the authorization is terminated or revoked sooner.    Influenza A by PCR NEGATIVE NEGATIVE Final   Influenza B by PCR NEGATIVE NEGATIVE Final    Comment: (NOTE) The Xpert Xpress SARS-CoV-2/FLU/RSV assay is intended as an aid in  the diagnosis of influenza from Nasopharyngeal swab specimens and  should not be used as a sole basis for treatment. Nasal washings and  aspirates are unacceptable for Xpert Xpress SARS-CoV-2/FLU/RSV  testing. Fact Sheet for Patients: https://www.moore.com/ Fact Sheet for Healthcare Providers: https://www.young.biz/ This test is not yet approved or cleared by the [place] FDA and  has been authorized for detection and/or diagnosis of SARS-CoV-2 by  FDA under an Emergency Use Authorization (EUA). This EUA will remain  in effect (meaning this test can be used) for the duration of the  Covid-19 declaration under Section 564(b)(1) of the Act, 21  U.S.C. section 360bbb-3(b)(1), unless the authorization is  terminated or revoked. Performed at [facility], [address] [place], [place] [address]   SARS CORONAVIRUS 2 (TAT 6-24 HRS) Nasopharyngeal Nasopharyngeal Swab     Status: None   Collection Time: 06/04/19 11:36 AM   Specimen: Nasopharyngeal Swab  Result Value Ref Range Status   SARS Coronavirus 2 NEGATIVE NEGATIVE Final    Comment: (NOTE) SARS-CoV-2 target nucleic acids are NOT  DETECTED. The SARS-CoV-2 RNA is generally detectable in upper and lower respiratory specimens during the acute phase of infection. Negative results do not preclude SARS-CoV-2 infection, do not rule out co-infections with other pathogens, and should not be used as the sole basis for treatment or other patient management decisions. Negative results must be combined with clinical observations, patient history, and epidemiological information. The expected result is Negative. Fact Sheet for Patients: HairSlick.no Fact Sheet for  Healthcare Providers: https://www.woods-mathews.com/ This test is not yet approved or cleared by the [place] and  has been authorized for detection and/or diagnosis of SARS-CoV-2 by FDA under an Emergency Use Authorization (EUA). This EUA will remain  in effect (meaning this test can be used) for the duration of the COVID-19 declaration under Section 56 4(b)(1) of the Act, 21 U.S.C. section 360bbb-3(b)(1), unless the authorization is terminated or revoked sooner. Performed at [facility], [place] [address] [place], [place] [address]      Labs: BNP (last 3 results) Recent Labs    03/20/19 1157 03/23/19 0930 04/25/19 0233  BNP 2,161.2* 2,115.8* 1,601.0*   Basic Metabolic Panel: Recent Labs  Lab 05/31/19 1743 05/31/19 1924 06/01/19 0153 06/02/19 0200 06/03/19 0335 06/04/19 0506 06/05/19 0457  NA   < >  --  135 133* 132* 134* 133*  K   < >  --  5.1 4.9 4.6 4.8 4.2  CL   < >  --  107 105 104 103 104  CO2   < >  --  15* 14* 17* 17* 15*  GLUCOSE   < >  --  78 148* 141* 134* 135*  BUN   < >  --  74* 71* 73* 73* 72*  CREATININE   < >  --  3.37* 2.98* 3.09* 2.99* 2.79*  CALCIUM   < >  --  8.0* 8.9 8.7* 8.7* 8.4*  MG  --  2.1  --  2.1 1.9 2.1 2.0  PHOS  --   --   --  4.9*  --   --   --    < > = values in this interval not displayed.   Liver Function Tests: Recent Labs  Lab 06/01/19 0153  06/02/19 0200 06/03/19 0335 06/04/19 0506 06/05/19 0457  AST 29 24 23 24 22   ALT 17 15 13 13 12   ALKPHOS 96 99 92 86 73  BILITOT 0.9 0.8 1.1 1.2 1.1  PROT 5.7* 6.3* 6.6 6.1* 5.8*  ALBUMIN 2.5* 2.7* 2.5* 2.4* 2.1*   No results for input(s): LIPASE, AMYLASE in the last 168 hours. No results for input(s): AMMONIA in the last 168 hours. CBC: Recent Labs  Lab 05/31/19 1743 05/31/19 1743 06/01/19 0153 06/02/19 0200 06/03/19 0335 06/04/19 0506 06/05/19 0457  WBC 9.9   < > 9.5 9.2 10.7* 10.1 8.9  NEUTROABS 7.3  --   --   --   --   --   --   HGB 9.2*   < > 8.5* 8.5* 8.2* 8.5* 7.9*  HCT 30.9*   < > 28.9* 28.9* 28.1* 28.7* 26.6*  MCV 75.6*   < > 75.1* 74.5* 74.5* 74.5* 73.5*  PLT 404*   < > 347 301 292 286 247   < > = values in this interval not displayed.   Cardiac Enzymes: No results for input(s): CKTOTAL, CKMB, CKMBINDEX, TROPONINI in the last 168 hours. BNP: Invalid input(s): POCBNP CBG: Recent Labs  Lab 06/04/19 1113 06/04/19 1628 06/04/19 2135 06/05/19 0005 06/05/19 0353  GLUCAP 214* 152* 125* 163* 134*   D-Dimer No results for input(s): DDIMER in the last 72 hours. Hgb A1c No results for input(s): HGBA1C in the last 72 hours. Lipid Profile No results for input(s): CHOL, HDL, LDLCALC, TRIG, CHOLHDL, LDLDIRECT in the last 72 hours. Thyroid function studies No results for input(s): TSH, T4TOTAL, T3FREE, THYROIDAB in the last 72 hours.  Invalid input(s): FREET3 Anemia work up No results for  input(s): VITAMINB12, FOLATE, FERRITIN, TIBC, IRON, RETICCTPCT in the last 72 hours. Urinalysis    Component Value Date/Time   COLORURINE YELLOW 06/03/2019 1545   APPEARANCEUR CLEAR 06/03/2019 1545   LABSPEC 1.015 06/03/2019 1545   PHURINE 5.0 06/03/2019 1545   GLUCOSEU NEGATIVE 06/03/2019 1545   HGBUR NEGATIVE 06/03/2019 1545   BILIRUBINUR NEGATIVE 06/03/2019 1545   KETONESUR 5 (A) 06/03/2019 1545   PROTEINUR NEGATIVE 06/03/2019 1545   UROBILINOGEN 1.0 10/16/2009 0243    NITRITE NEGATIVE 06/03/2019 1545   LEUKOCYTESUR NEGATIVE 06/03/2019 1545   Sepsis Labs Invalid input(s): PROCALCITONIN,  WBC,  LACTICIDVEN Microbiology Recent Results (from the past 240 hour(s))  Blood culture (routine x 2)     Status: None   Collection Time: 05/31/19  5:18 PM   Specimen: BLOOD RIGHT ARM  Result Value Ref Range Status   Specimen Description BLOOD RIGHT ARM  Final   Special Requests   Final    BOTTLES DRAWN AEROBIC AND ANAEROBIC Blood Culture adequate volume   Culture   Final    NO GROWTH 5 DAYS Performed at [facility], [address], [place], [place] [address]    Report Status 06/05/2019 FINAL  Final  Blood culture (routine x 2)     Status: None   Collection Time: 05/31/19  5:23 PM   Specimen: BLOOD RIGHT HAND  Result Value Ref Range Status   Specimen Description BLOOD RIGHT HAND  Final   Special Requests   Final    BOTTLES DRAWN AEROBIC AND ANAEROBIC Blood Culture results may not be optimal due to an inadequate volume of blood received in culture bottles   Culture   Final    NO GROWTH 5 DAYS Performed at [facility], [address] [place], [place] [address]    Report Status 06/05/2019 FINAL  Final  Respiratory Panel by RT PCR (Flu A&B, Covid) - Nasopharyngeal Swab     Status: None   Collection Time: 05/31/19  6:45 PM   Specimen: Nasopharyngeal Swab  Result Value Ref Range Status   SARS Coronavirus 2 by RT PCR NEGATIVE NEGATIVE Final    Comment: (NOTE) SARS-CoV-2 target nucleic acids are NOT DETECTED. The SARS-CoV-2 RNA is generally detectable in upper respiratoy specimens during the acute phase of infection. The lowest concentration of SARS-CoV-2 viral copies this assay can detect is 131 copies/mL. A negative result does not preclude SARS-Cov-2 infection and should not be used as the sole basis for treatment or other patient management decisions. A negative result may occur with  improper specimen collection/handling, submission of  specimen other than nasopharyngeal swab, presence of viral mutation(s) within the areas targeted by this assay, and inadequate number of viral copies (<131 copies/mL). A negative result must be combined with clinical observations, patient history, and epidemiological information. The expected result is Negative. Fact Sheet for Patients:  https://www.moore.com/ Fact Sheet for Healthcare Providers:  https://www.young.biz/ This test is not yet ap proved or cleared by the [place] FDA and  has been authorized for detection and/or diagnosis of SARS-CoV-2 by FDA under an Emergency Use Authorization (EUA). This EUA will remain  in effect (meaning this test can be used) for the duration of the COVID-19 declaration under Section 564(b)(1) of the Act, 21 U.S.C. section 360bbb-3(b)(1), unless the authorization is terminated or revoked sooner.    Influenza A by PCR NEGATIVE NEGATIVE Final   Influenza B by PCR NEGATIVE NEGATIVE Final    Comment: (NOTE) The Xpert Xpress SARS-CoV-2/FLU/RSV assay is  intended as an aid in  the diagnosis of influenza from Nasopharyngeal swab specimens and  should not be used as a sole basis for treatment. Nasal washings and  aspirates are unacceptable for Xpert Xpress SARS-CoV-2/FLU/RSV  testing. Fact Sheet for Patients: https://www.moore.com/ Fact Sheet for Healthcare Providers: https://www.young.biz/ This test is not yet approved or cleared by the [place] FDA and  has been authorized for detection and/or diagnosis of SARS-CoV-2 by  FDA under an Emergency Use Authorization (EUA). This EUA will remain  in effect (meaning this test can be used) for the duration of the  Covid-19 declaration under Section 564(b)(1) of the Act, 21  U.S.C. section 360bbb-3(b)(1), unless the authorization is  terminated or revoked. Performed at [facility], [address], [place],  [place] [address]   SARS CORONAVIRUS 2 (TAT 6-24 HRS) Nasopharyngeal Nasopharyngeal Swab     Status: None   Collection Time: 06/04/19 11:36 AM   Specimen: Nasopharyngeal Swab  Result Value Ref Range Status   SARS Coronavirus 2 NEGATIVE NEGATIVE Final    Comment: (NOTE) SARS-CoV-2 target nucleic acids are NOT DETECTED. The SARS-CoV-2 RNA is generally detectable in upper and lower respiratory specimens during the acute phase of infection. Negative results do not preclude SARS-CoV-2 infection, do not rule out co-infections with other pathogens, and should not be used as the sole basis for treatment or other patient management decisions. Negative results must be combined with clinical observations, patient history, and epidemiological information. The expected result is Negative. Fact Sheet for Patients: HairSlick.no Fact Sheet for Healthcare Providers: quierodirigir.com This test is not yet approved or cleared by the [place] FDA and  has been authorized for detection and/or diagnosis of SARS-CoV-2 by FDA under an Emergency Use Authorization (EUA). This EUA will remain  in effect (meaning this test can be used) for the duration of the COVID-19 declaration under Section 56 4(b)(1) of the Act, 21 U.S.C. section 360bbb-3(b)(1), unless the authorization is terminated or revoked sooner. Performed at [facility], [address], [place], [place] [address]      Time coordinating discharge: 35 minutes  SIGNED:   Glade Lloyd, MD  [facility] 06/05/2019, 10:48 AM

## 2019-06-05 NOTE — Progress Notes (Signed)
ANTICOAGULATION CONSULT NOTE - Follow Up Consult  Pharmacy Consult for Heparin >> Apixaban Indication: atrial fibrillation  Allergies  Allergen Reactions  . Bactrim [Sulfamethoxazole-Trimethoprim] Nausea And Vomiting  . Doxycycline Other (See Comments)    Lethargy   . Hydrochlorothiazide Other (See Comments)    Lethargy   . Latex Rash  . Penicillins Swelling and Rash    Pt states she has tolerated Keflex in the past without problems. States she may have tolerated Augmentin in the past but it caused GI upset. Has patient had a PCN reaction causing immediate rash, facial/tongue/throat swelling, SOB or lightheadedness with hypotension: Yes Has patient had a PCN reaction causing severe rash involving mucus membranes or skin necrosis: No Has patient had a PCN reaction that required hospitalization No Has patient had a PCN reaction occurring within the last 10 years: No    Patient Measurements: Height: 5\' 3"  (160 cm) Weight: 83.9 kg (185 lb) IBW/kg (Calculated) : 52.4 Heparin Dosing Weight: 71 kg  Vital Signs: Temp: 97.7 F (36.5 C) (05/08 0753) Temp Source: Oral (05/08 0753) BP: 123/50 (05/08 0753) Pulse Rate: 62 (05/08 0753)  Labs: Recent Labs    06/03/19 0335 06/03/19 0335 06/04/19 0506 06/04/19 1011 06/04/19 1801 06/05/19 0457  HGB 8.2*   < > 8.5*  --   --  7.9*  HCT 28.1*  --  28.7*  --   --  26.6*  PLT 292  --  286  --   --  247  APTT  --   --   --  50* 145* 63*  HEPARINUNFRC  --   --   --  >2.20*  --  >2.20*  CREATININE 3.09*  --  2.99*  --   --  2.79*   < > = values in this interval not displayed.    Estimated Creatinine Clearance: 19.5 mL/min (A) (by C-G formula based on SCr of 2.79 mg/dL (H)).   Medications:  Infusions:  . ceFEPime (MAXIPIME) IV 2 g (06/04/19 1737)  . heparin 800 Units/hr (06/05/19 0725)  . linezolid (ZYVOX) IV 600 mg (06/05/19 1028)    Assessment: 69 year old with history of atrial fibrillation on Eliquis prior (last dose 5/6 at  2200PM) now transitioned to IV Heparin per pharmacy protocol. Patient has CKD.   Pharmacy consulted to switch heparin to Eliquis.   Hemoglobin trended down to 7.9. Platelets are wnl.  Goal of Therapy:  Heparin level 0.3-0.7 units/ml aPTT 66-102 seconds Monitor platelets by anticoagulation protocol: Yes   Plan:  Start Eliquis 5 mg po bid D/c heparin drip 1 hour after first Eliquis dose Continue to monitor for any signs and symptoms of bleeding   78, PharmD, [facility] Clinical Pharmacist Please see AMION for all Pharmacists' Contact Phone Numbers 06/05/2019, 10:36 AM

## 2019-06-05 NOTE — Progress Notes (Signed)
ANTICOAGULATION CONSULT NOTE - Follow Up Consult  Pharmacy Consult for Heparin Indication: atrial fibrillation  Allergies  Allergen Reactions  . Bactrim [Sulfamethoxazole-Trimethoprim] Nausea And Vomiting  . Doxycycline Other (See Comments)    Lethargy   . Hydrochlorothiazide Other (See Comments)    Lethargy   . Latex Rash  . Penicillins Swelling and Rash    Pt states she has tolerated Keflex in the past without problems. States she may have tolerated Augmentin in the past but it caused GI upset. Has patient had a PCN reaction causing immediate rash, facial/tongue/throat swelling, SOB or lightheadedness with hypotension: Yes Has patient had a PCN reaction causing severe rash involving mucus membranes or skin necrosis: No Has patient had a PCN reaction that required hospitalization No Has patient had a PCN reaction occurring within the last 10 years: No    Patient Measurements: Height: 5\' 3"  (160 cm) Weight: 83.9 kg (185 lb) IBW/kg (Calculated) : 52.4 Heparin Dosing Weight: 71 kg  Vital Signs: Temp: 97.7 F (36.5 C) (05/08 0401) Temp Source: Oral (05/08 0401) BP: 121/51 (05/08 0401) Pulse Rate: 64 (05/08 0401)  Labs: Recent Labs    06/03/19 0335 06/03/19 0335 06/04/19 0506 06/04/19 1011 06/04/19 1801 06/05/19 0457  HGB 8.2*   < > 8.5*  --   --  7.9*  HCT 28.1*  --  28.7*  --   --  26.6*  PLT 292  --  286  --   --  247  APTT  --   --   --  50* 145* 63*  HEPARINUNFRC  --   --   --  >2.20*  --  >2.20*  CREATININE 3.09*  --  2.99*  --   --  2.79*   < > = values in this interval not displayed.    Estimated Creatinine Clearance: 19.5 mL/min (A) (by C-G formula based on SCr of 2.79 mg/dL (H)).   Medications:  Infusions:  . ceFEPime (MAXIPIME) IV 2 g (06/04/19 1737)  . heparin 750 Units/hr (06/04/19 2019)  . linezolid (ZYVOX) IV 600 mg (06/04/19 2230)    Assessment: 69 year old with history of atrial fibrillation on Eliquis prior (last dose 5/6 at 2200PM) now  transitioned to IV Heparin per pharmacy protocol. Patient has CKD.   Heparin level remains falsely elevated due to recent Eliquis use. aPTT is now slightly subtherapeutic after rate decrease at 63 sec. 78  Heparin running in R-arm, level drawn from L-hand.  No problems with infusion and no bleeding noted.   Hemoglobin trended down to 7.9. Platelets are wnl.  Goal of Therapy:  Heparin level 0.3-0.7 units/ml aPTT 66-102 seconds Monitor platelets by anticoagulation protocol: Yes   Plan:  Increase heparin rate slightly to 800 units/hr given drop in hgb Check repeat aPTT in 8 hours   Daily heparin level, aPTT until levels correlate.  Continue to monitor for any signs and symptoms of bleeding and daily CBC.  Marland Kitchen, PharmD, BCPS PGY2 Cardiology Pharmacy Resident 06/05/2019       7:02 AM  Please check [email or web address] for unit-specific pharmacist phone numbers

## 2019-06-05 NOTE — Progress Notes (Addendum)
Daughter Leotis Shames called as she was following discharge orders on myChart. She voiced that she has asked to speak to a provider and has not yet heard from one. She had spoken with Child psychotherapist on 06/04/19.   Pt is alert but lethargic this am. Received Dilaudid at around 0600 for pain. Her movements are clumsy and weak. She is nauseous and could not eat. Zofran 4 mg IV for nausea. Eliquis ordered to be started and Heparin Gtt stopped one hour after first dose of Eliquis. First does of Eliquis given at 1130. Daugther states she was weak but feels this is weaker, that she could not really talk to her on the phone. Will notify provider, who was in to see her this am and notified of dip in BP/sleepiness due to pain medicaion (which corrected on retake).   Gabriel Cirri RN   [phone] Provider notified and responded he will contact daughter today. Gabriel Cirri RN

## 2019-06-05 NOTE — Progress Notes (Signed)
Dressing change completed on left calf and right foot.  Mepitel silicone dressing was not changed (good for 5 days) but the Aquacel and Gauze were changed due to serosanguinous drainage.

## 2019-06-07 ENCOUNTER — Emergency Department (HOSPITAL_COMMUNITY)
Admission: EM | Admit: 2019-06-07 | Discharge: 2019-06-29 | Disposition: E | Payer: Medicare Other | Attending: Emergency Medicine | Admitting: Emergency Medicine

## 2019-06-07 DIAGNOSIS — I130 Hypertensive heart and chronic kidney disease with heart failure and stage 1 through stage 4 chronic kidney disease, or unspecified chronic kidney disease: Secondary | ICD-10-CM | POA: Insufficient documentation

## 2019-06-07 DIAGNOSIS — R5383 Other fatigue: Secondary | ICD-10-CM | POA: Diagnosis not present

## 2019-06-07 DIAGNOSIS — Z7901 Long term (current) use of anticoagulants: Secondary | ICD-10-CM | POA: Diagnosis not present

## 2019-06-07 DIAGNOSIS — Z794 Long term (current) use of insulin: Secondary | ICD-10-CM | POA: Diagnosis not present

## 2019-06-07 DIAGNOSIS — N183 Chronic kidney disease, stage 3 unspecified: Secondary | ICD-10-CM | POA: Diagnosis not present

## 2019-06-07 DIAGNOSIS — I469 Cardiac arrest, cause unspecified: Secondary | ICD-10-CM | POA: Insufficient documentation

## 2019-06-07 DIAGNOSIS — E1122 Type 2 diabetes mellitus with diabetic chronic kidney disease: Secondary | ICD-10-CM | POA: Insufficient documentation

## 2019-06-07 DIAGNOSIS — I5021 Acute systolic (congestive) heart failure: Secondary | ICD-10-CM | POA: Diagnosis not present

## 2019-06-07 DIAGNOSIS — R5381 Other malaise: Secondary | ICD-10-CM | POA: Diagnosis not present

## 2019-06-07 DIAGNOSIS — I5043 Acute on chronic combined systolic (congestive) and diastolic (congestive) heart failure: Secondary | ICD-10-CM | POA: Diagnosis not present

## 2019-06-07 DIAGNOSIS — I480 Paroxysmal atrial fibrillation: Secondary | ICD-10-CM | POA: Diagnosis not present

## 2019-06-07 DIAGNOSIS — N1832 Chronic kidney disease, stage 3b: Secondary | ICD-10-CM | POA: Diagnosis not present

## 2019-06-07 DIAGNOSIS — N179 Acute kidney failure, unspecified: Secondary | ICD-10-CM | POA: Diagnosis not present

## 2019-06-07 DIAGNOSIS — Z79899 Other long term (current) drug therapy: Secondary | ICD-10-CM | POA: Insufficient documentation

## 2019-06-07 LAB — CBG MONITORING, ED
Glucose-Capillary: 47 mg/dL — ABNORMAL LOW (ref 70–99)
Glucose-Capillary: 52 mg/dL — ABNORMAL LOW (ref 70–99)

## 2019-06-07 MED ORDER — EPINEPHRINE 1 MG/10ML IJ SOSY
PREFILLED_SYRINGE | INTRAMUSCULAR | Status: DC | PRN
  Administered 2019-06-07: 1 via INTRAVENOUS

## 2019-06-07 MED ORDER — DEXTROSE 50 % IV SOLN
INTRAVENOUS | Status: DC | PRN
  Administered 2019-06-07: 1 via INTRAVENOUS

## 2019-06-07 MED ORDER — CALCIUM CHLORIDE 10 % IV SOLN
INTRAVENOUS | Status: DC | PRN
  Administered 2019-06-07: 1 g via INTRAVENOUS

## 2019-06-07 MED ORDER — SODIUM BICARBONATE 8.4 % IV SOLN
INTRAVENOUS | Status: DC | PRN
  Administered 2019-06-07: 50 meq via INTRAVENOUS

## 2019-06-08 MED FILL — Medication: Qty: 1 | Status: AC

## 2019-06-29 NOTE — Code Documentation (Signed)
Pt arrives with gcems, no pulses upon arrival, CPR restarted with lucas

## 2019-06-29 NOTE — Progress Notes (Signed)
The chaplain responded to a page for spiritual care. The patient died after CPR. The chaplain visited with the family and helped them navigate what happens next with their deceased loved one. The chaplain does not need to follow-up.  Lavone Neri Chaplain Resident For questions concerning this note please contact me by pager [phone]

## 2019-06-29 NOTE — Code Documentation (Signed)
Pulse check: no pulses, CPR continued

## 2019-06-29 NOTE — Code Documentation (Signed)
Pulse check: no pulses, CPR stopped, time of death called by Dr Criss Alvine

## 2019-06-29 NOTE — ED Provider Notes (Signed)
[facility] EMERGENCY DEPARTMENT Provider Note   CSN: [id number] Arrival date & time: 06/09/19  1422  LEVEL 5 CAVEAT - CARDIAC ARREST History Chief Complaint  Patient presents with  . Cardiac Arrest    [name] [name] is a 69 y.o. female.  HPI 69 year old female presents with cardiac arrest.  History is by EMS as the patient is unresponsive.  Patient was seen around 1 PM when they went back to check on her a little before 2:00 she was unresponsive.  Brief chart review indicates that she was recently admitted and had acute kidney injury and hyperkalemia.  She has received CPR for 40+ minutes.  She has received 9 epinephrines.  Initial glucose around 80.  Further history is unavailable.   Past Medical History:  Diagnosis Date  . AKI (acute kidney injury) (HCC) 05/04/2019  . Anemia 10/2015   Acute Blood Loss  . Arthritis    "feel like I have it all over" (08/28/2015)  . Cellulitis and abscess of foot 04/26/2019   left lower extremity  . CHF (congestive heart failure) (HCC)   . Complication of anesthesia    DIFFICULT WAKING "only when I was smoking; no problems since I quit"  . Coronary artery disease   . Family history of adverse reaction to anesthesia    sister slow to wake up  . Gangrene of left foot (HCC) 10/17/2016  . Gangrene of right foot (HCC) 11/01/2016  . GERD (gastroesophageal reflux disease)    takes Protonix daily   . Hip bursitis   . History of blood transfusion    10/2015  . Hyperlipidemia LDL goal < 70 06/28/2013   takes Atorvastatin daily  . Hypertension    takes Metoprolol and Imdur daily  . Hypoxia 01/2018  . Malnutrition (HCC)   . Migraine    "none in a long time" (08/28/2015)  . Myocardial infarction (HCC) 2011  . Neuromuscular disorder (HCC)    DIABETIC NEUROPATHY  . Osteomyelitis (HCC) 2017   Left foot  . PAD (peripheral artery disease) (HCC)   . Peripheral vascular disease (HCC)   . Respiratory failure (HCC) 10/2015   Acute  Hypoxia- acute pulmonary edema 11/13/2015  . Sepsis (HCC) 10/17/2016  . Septic shock (HCC) 10/2015  . Stroke (HCC)   . Type II diabetes mellitus (HCC)    takes Lantus nightly.Average fasting blood sugar runs 80-90  Type II    Patient Active Problem List   Diagnosis Date Noted  . Wound infection 05/31/2019  . Acute kidney injury superimposed on CKD (HCC) 05/04/2019  . Nausea vomiting and diarrhea 05/04/2019  . CHF exacerbation (HCC) 04/24/2019  . Chronic a-fib (HCC) 04/24/2019  . Cellulitis 04/24/2019  . Vertigo 03/25/2019  . Atrial fibrillation with RVR (HCC) 03/20/2019  . Hyperkalemia   . Dyspnea 03/11/2019  . Elevated troponin 03/11/2019  . Chest pain 03/11/2019  . Acute on chronic combined systolic and diastolic CHF (congestive heart failure) (HCC) 05/08/2018  . Renal insufficiency 02/13/2018  . Stage 3b chronic kidney disease 02/13/2018  . Acute systolic CHF (congestive heart failure) (HCC) 02/13/2018  . Benign hypertension 11/26/2017  . GERD (gastroesophageal reflux disease) 11/01/2016  . Midfoot ulcer, left, limited to breakdown of skin (HCC) 01/08/2016  . Thigh hematoma 12/22/2015  . History of blood transfusion 12/22/2015  . Acute on chronic systolic CHF (congestive heart failure) (HCC) 12/22/2015  . H/O skin graft 12/11/2015  . SEMI (subendocardial myocardial infarction) (HCC) 11/19/2015  . DM type 2 with diabetic  peripheral neuropathy ([place]) 11/19/2015  . Traumatic hemorrhagic shock ([place])   . Chronic systolic CHF (congestive heart failure) ([place])   . Uncontrolled type 2 diabetes mellitus with complication ([place])   . Demand myocardial infarction ([place]) 11/08/2015  . Surgery, elective   . Central line infection, initial encounter   . Septic shock ([place]) 11/03/2015  . Encephalopathy acute 11/03/2015  . Acute blood loss as cause of postoperative anemia 11/03/2015  . Acute respiratory failure with hypoxia ([place])   . CVA (cerebral vascular accident) ([place])   . S/P  femoral-popliteal bypass surgery   . Malnutrition of moderate degree 11/01/2015  . Diabetic ulcer of left midfoot associated with diabetes mellitus due to underlying condition, with muscle involvement without evidence of necrosis ([place])   . Hypertensive heart disease with chronic diastolic congestive heart failure ([place]) 10/31/2015  . Osteomyelitis of left foot ([place]) 10/30/2015  . PVD (peripheral vascular disease) with claudication ([place]) 09/28/2015  . Critical lower limb ischemia 08/28/2015  . Hyperlipidemia with target LDL less than 70 06/28/2013  . Diabetes mellitus type II, uncontrolled ([place]) 03/03/2013  . Complicated migraine 90/24/0973  . Stroke-like symptom 03/02/2013  . Diabetes mellitus ([place]) 03/02/2013  . Aphasia 03/02/2013  . Headache(784.0) 03/02/2013  . PAD (peripheral artery disease) ([place]) 06/05/2011  . Coronary artery disease involving native coronary artery of native heart without angina pectoris 06/05/2011  . Hypotension 06/05/2011  . Claudication in peripheral vascular disease:  Lifestyle limiting. 06/05/2011  . Hx of tobacco use, presenting hazards to health 06/05/2011    Past Surgical History:  Procedure Laterality Date  . ABDOMINAL HYSTERECTOMY    . AMPUTATION Right 10/22/2016   Procedure: AMPUTATION DIGIT RIGHT TOES 1-3 POSSIBLE TRANSMETATARSAL;  Surgeon: Angelia Mould, MD;  Location: [place];  Service: Vascular;  Laterality: Right;  . APPENDECTOMY    . ATHERECTOMY N/A 06/04/2011   Procedure: ATHERECTOMY;  Surgeon: Lorretta Harp, MD;  Location: [facility] CATH LAB;  Service: Cardiovascular;  Laterality: N/A;  . CARDIAC CATHETERIZATION  10/13/2009   95% stenosis in the AV groove circumflex and 95% ostial stenosis in small OM3. A 3x36mm drug-eluting Promus stent inserted ito the circumflex. Dilatated with a 3.25x60mm noncompliant Quantum balloon within entire segment. The entire region was reduced to 0% and brisk TIMI3 flow.  . CAROTID DUPLEX  03/19/2011   Right  ICA-demonstrates complete occlusion. Left ICA-demonstrates a small amount of fibrous plaque.  Marland Kitchen CATARACT EXTRACTION W/ INTRAOCULAR LENS IMPLANT Right   . CESAREAN SECTION  1990  . CORONARY ANGIOPLASTY    . ENDARTERECTOMY FEMORAL Left 09/05/2015   Procedure: ENDARTERECTOMY FEMORAL WITH PROFUNDOPLASTY;  Surgeon: Angelia Mould, MD;  Location: [facility] OR;  Service: Vascular;  Laterality: Left;  Left common femoral artery vein patch using left saphenous vien  . ENDARTERECTOMY FEMORAL Right 10/22/2016   Procedure: ENDARTERECTOMY RIGHT COMMON FEMORAL;  Surgeon: Angelia Mould, MD;  Location: [place];  Service: Vascular;  Laterality: Right;  . FEMORAL-POPLITEAL BYPASS GRAFT Left 11/02/2015   Procedure: BYPASS GRAFT FEMORAL-POPLITEAL ARTERY VS FEMORAL-TIBIAL ARTERY BYPASS;  Surgeon: Angelia Mould, MD;  Location: [place];  Service: Vascular;  Laterality: Left;  . FEMORAL-POPLITEAL BYPASS GRAFT Right 10/22/2016   Procedure: BYPASS GRAFT RIGHT FEMORAL- BELOW KNEE POPLITEAL ARTERY WITH VEIN;  Surgeon: Angelia Mould, MD;  Location: [place];  Service: Vascular;  Laterality: Right;  . I & D EXTREMITY Left 11/10/2015   Procedure: Debridement Left Foot Ulcer, Application  Wound VAC;  Surgeon: Newt Minion, MD;  Location:  [facility] OR;  Service: Orthopedics;  Laterality: Left;  . ILIAC ARTERY STENT Left 08/28/2015   common  . INTRAOPERATIVE ARTERIOGRAM Left 09/05/2015   Procedure: INTRA OPERATIVE ARTERIOGRAM;  Surgeon: Chuck Hint, MD;  Location: [facility] OR;  Service: Vascular;  Laterality: Left;  . INTRAOPERATIVE ARTERIOGRAM Left 11/02/2015   Procedure: INTRA OPERATIVE ARTERIOGRAM;  Surgeon: Chuck Hint, MD;  Location: [facility] OR;  Service: Vascular;  Laterality: Left;  . INTRAOPERATIVE ARTERIOGRAM Right 10/22/2016   Procedure: INTRA OPERATIVE ARTERIOGRAM;  Surgeon: Chuck Hint, MD;  Location: [facility] OR;  Service: Vascular;  Laterality: Right;  . LEFT HEART CATH AND CORONARY ANGIOGRAPHY  N/A 05/11/2018   Procedure: LEFT HEART CATH AND CORONARY ANGIOGRAPHY;  Surgeon: Lennette Bihari, MD;  Location: [facility] INVASIVE CV LAB;  Service: Cardiovascular;  Laterality: N/A;  Eugenie Birks MYOVIEW  10/25/2010   Moderate perfusion defect due to infarct/scar with mild perinfarct ischemia seen in the Basal Inferolateral, Basal Anterolateral, Mid Inferolateral, and Mid Anterolateral regions. Post-stress EF is 50%.  . LOWER EXTREMITY ANGIOGRAPHY N/A 10/17/2016   Procedure: Lower Extremity Angiography;  Surgeon: Runell Gess, MD;  Location: [facility] INVASIVE CV LAB;  Service: Cardiovascular;  Laterality: N/A;  . OVARY SURGERY  1983?   "ruptured"  . PERIPHERAL VASCULAR ANGIOGRAM  01/26/2010   High-grade SFA disease: left greater than right. Left SFA would require fem-pop bypass grafting. Right SFA could be stented but might require Diamondback Orbital atherectomy.  Marland Kitchen PERIPHERAL VASCULAR ANGIOGRAM  02/23/2010   Stealth Predator orbital rotational atherectomy performed on SFA & Popliteal up to 90,000 RPM. Stenting using overlapping 5x126mm and 5x61mm Absolute Pro Nitinol self-expanding stents beginning just at the knee up to the mid SFA resulting in reduction of 90-95% calcified SFA & Popliteal stenosis to 0. Stenting performed on the distal common & proximal iliac artery with a 10x4 Absolute Pro- 70-0%.  Marland Kitchen PERIPHERAL VASCULAR ANGIOGRAM  06/17/2010   PTA performed to the right external iliac artery stent using a 5x100 balloon at 10 atmospheres. Stenting performed using a 6x18 Genesis on Opta balloon. Postdilatation with a 7x2 balloon resulting in a 95% "in-stent" stenosis to 0% residual.  . PERIPHERAL VASCULAR ANGIOGRAM  06/04/2011   Bilateral total SFAs not percutaneously addressable. Good canidate for femoropopliteal bypass grafting  . PERIPHERAL VASCULAR ANGIOGRAM  08/28/2015  . PERIPHERAL VASCULAR CATHETERIZATION N/A 08/28/2015   Procedure: Lower Extremity Angiography;  Surgeon: Runell Gess, MD;  Location:  [facility] INVASIVE CV LAB;  Service: Cardiovascular;  Laterality: N/A;  . PERIPHERAL VASCULAR CATHETERIZATION N/A 08/28/2015   Procedure: Abdominal Aortogram;  Surgeon: Runell Gess, MD;  Location: [facility] INVASIVE CV LAB;  Service: Cardiovascular;  Laterality: N/A;  . PERIPHERAL VASCULAR CATHETERIZATION Left 08/28/2015   Procedure: Peripheral Vascular Intervention;  Surgeon: Runell Gess, MD;  Location: [facility] INVASIVE CV LAB;  Service: Cardiovascular;  Laterality: Left;  common iliac  . PERIPHERAL VASCULAR CATHETERIZATION Left 08/28/2015   Procedure: Peripheral Vascular Atherectomy;  Surgeon: Runell Gess, MD;  Location: [facility] INVASIVE CV LAB;  Service: Cardiovascular;  Laterality: Left;  common iliac  . PERIPHERAL VASCULAR CATHETERIZATION N/A 09/28/2015   Procedure: Lower Extremity Angiography;  Surgeon: Runell Gess, MD;  Location: [facility] INVASIVE CV LAB;  Service: Cardiovascular;  Laterality: N/A;  . PERIPHERAL VASCULAR CATHETERIZATION Left 09/28/2015   Procedure: Peripheral Vascular Intervention;  Surgeon: Runell Gess, MD;  Location: [facility] INVASIVE CV LAB;  Service: Cardiovascular;  Laterality: Left CFA  PCI with 9 mm x 4 cm Abbott nitinol  absolute Pro self-expanding stent     . SKIN SPLIT GRAFT Left 12/01/2015   Procedure: LEFT FOOT SKIN GRAFT AND VAC;  Surgeon: [name], MD;  Location: [facility] OR;  Service: Orthopedics;  Laterality: Left;  . THROMBECTOMY FEMORAL ARTERY Right 10/22/2016   Procedure: THROMBECTOMY FEMORAL ARTERY;  Surgeon: Chuck Hint, MD;  Location: [facility] OR;  Service: Vascular;  Laterality: Right;  . TRANSTHORACIC ECHOCARDIOGRAM  10/17/2009   EF 45-50%, moderate hypokinesis of the entire inferolateral myocardium, mild concentric hypertrophy and mild regurg of the mitral valva.  Marland Kitchen VEIN HARVEST Left 11/02/2015   Procedure: LEFT GREATER SAPHENOUS VEIN HARVEST;  Surgeon: Chuck Hint, MD;  Location: [facility] OR;  Service: Vascular;  Laterality: Left;     OB History   No  obstetric history on file.     Family History  Problem Relation Age of Onset  . Hypertension Mother   . Heart failure Mother   . Heart failure Father   . Stroke Father   . Diabetes Father     Social History   Tobacco Use  . Smoking status: Former Smoker    Packs/day: 1.50    Years: 41.00    Pack years: 61.50    Types: Cigarettes    Quit date: 10/12/2009    Years since quitting: 9.6  . Smokeless tobacco: Never Used  Substance Use Topics  . Alcohol use: No  . Drug use: No    Home Medications Prior to Admission medications   Medication Sig Start Date End Date Taking? Authorizing Provider  acetaminophen (TYLENOL) 500 MG tablet Take 2 tablets (1,000 mg total) by mouth every 6 (six) hours as needed for headache (pain). 06/05/19   Glade Lloyd, MD  albuterol (VENTOLIN HFA) 108 (90 Base) MCG/ACT inhaler Inhale 2 puffs into the lungs every 6 (six) hours as needed for wheezing or shortness of breath.    [provider]  alum & mag hydroxide-simeth (MAALOX/MYLANTA) 200-200-20 MG/5ML suspension Take 15 mLs by mouth every 6 (six) hours as needed for indigestion or heartburn. 06/05/19   Glade Lloyd, MD  apixaban (ELIQUIS) 5 MG TABS tablet Take 1 tablet (5 mg total) by mouth 2 (two) times daily. 03/25/19   Alessandra Bevels, MD  atorvastatin (LIPITOR) 80 MG tablet Take 80 mg by mouth at bedtime.     [provider]  cephALEXin (KEFLEX) 250 MG capsule Take 1 capsule (250 mg total) by mouth 2 (two) times daily for 5 days. 06/05/19 06/10/19  Glade Lloyd, MD  clopidogrel (PLAVIX) 75 MG tablet Take 75 mg by mouth daily.     [provider]  furosemide (LASIX) 40 MG tablet Take 40 mg by mouth 2 (two) times daily.    [provider]  glipiZIDE (GLUCOTROL) 5 MG tablet Take 0.5 tablets (2.5 mg total) by mouth daily before breakfast. 05/07/19   Rhetta Mura, MD  isosorbide mononitrate (IMDUR) 30 MG 24 hr tablet Take 3 tablets (90 mg total) by mouth  daily. Patient not taking: Reported on 05/31/2019 03/25/19   Alessandra Bevels, MD  metoprolol succinate (TOPROL-XL) 100 MG 24 hr tablet Take 1 tablet (100 mg total) by mouth daily. 06/05/19   Glade Lloyd, MD  nitroGLYCERIN (NITROSTAT) 0.4 MG SL tablet Place 0.4 mg under the tongue every 5 (five) minutes as needed for chest pain.  05/15/18   [provider]  ondansetron (ZOFRAN) 4 MG tablet Take 1 tablet (4 mg total) by mouth every 8 (eight) hours as needed for nausea  or vomiting. 06/05/19 06/04/20  Glade Lloyd, MD  pantoprazole (PROTONIX) 40 MG tablet Take 1 tablet (40 mg total) by mouth 2 (two) times daily. 06/05/19   Glade Lloyd, MD  traMADol (ULTRAM) 50 MG tablet Take 1 tablet (50 mg total) by mouth every 12 (twelve) hours as needed (pain). 06/05/19   Glade Lloyd, MD    Allergies    Bactrim [sulfamethoxazole-trimethoprim], Doxycycline, Hydrochlorothiazide, Latex, and Penicillins  Review of Systems   Review of Systems  Unable to perform ROS: Patient unresponsive    Physical Exam Updated Vital Signs BP (!) 157/91   Pulse (!) 111   Resp 15   Ht 5\' 3"  (1.6 m)   Wt 83.9 kg   SpO2 (!) 14%   BMI 32.77 kg/m   Physical Exam Vitals and nursing note reviewed.  Constitutional:      Appearance: She is well-developed. She is obese.     Interventions: She is intubated (king airway).  HENT:     Head: Normocephalic and atraumatic.     Right Ear: External ear normal.     Left Ear: External ear normal.     Nose: Nose normal.  Eyes:     General:        Right eye: No discharge.        Left eye: No discharge.  Pulmonary:     Effort: She is intubated (king airway).  Abdominal:     General: There is no distension.     Palpations: Abdomen is soft.  Musculoskeletal:     Right lower leg: Edema present.     Left lower leg: Edema present.  Skin:    General: Skin is dry.  Neurological:     Mental Status: She is unresponsive.  Psychiatric:        Mood and Affect: Mood is not  anxious.     ED Results / Procedures / Treatments   Labs (all labs ordered are listed, but only abnormal results are displayed) Labs Reviewed  CBG MONITORING, ED - Abnormal; Notable for the following components:      Result Value   Glucose-Capillary 47 (*)    All other components within normal limits  CBG MONITORING, ED - Abnormal; Notable for the following components:   Glucose-Capillary 52 (*)    All other components within normal limits    EKG None  Radiology No results found.  Procedures Procedures (including critical care time)  Cardiopulmonary Resuscitation (CPR) Procedure Note Directed/Performed by: I personally directed ancillary staff and/or performed CPR in an effort to regain return of spontaneous circulation and to maintain cardiac, neuro and systemic perfusion.   Medications Ordered in ED Medications  EPINEPHrine (ADRENALIN) 1 MG/10ML injection (1 Syringe Intravenous Given 2019/06/22 1425)  calcium chloride injection (1 g Intravenous Given 06/22/19 1426)  sodium bicarbonate injection (50 mEq Intravenous Given 06-22-19 1427)  dextrose 50 % solution (1 ampule Intravenous Given 2019/06/22 1428)    ED Course  I have reviewed the triage vital signs and the nursing notes.  Pertinent labs & imaging results that were available during my care of the patient were reviewed by me and considered in my medical decision making (see chart for details).    MDM Rules/Calculators/A&P                      Unfortunately, despite CPR, the patient was unable to be brought back.  She was pronounced at 1433.  Given chart review showing some acute kidney  injury last admission just a couple days ago, she was given calcium, bicarbonate.  She was given D50.  Unfortunately, she has now been down for almost 1 hour.  I highly doubt she will be able to have any functional neurologic status even if we were able to get her back.  She was pronounced and I updated family and they were  allowed to see her.  They do not want autopsy. Final Clinical Impression(s) / ED Diagnoses Final diagnoses:  Cardiac arrest [facility])    Rx / DC Orders ED Discharge Orders    None       Pricilla Loveless, MD 2019/07/04 1516

## 2019-06-29 NOTE — ED Triage Notes (Signed)
Pt from [employer] with [facility] for cardiac arrest. Pt was last seen at 1325 by staff, staff went in around 1330 and found pt unresponsive and apneic. CPR started by staff immediately. EMS arrived at 1340 and continued CPR, pt was initially asystole on the monitor, 9 epis given and PEA en route. Faint pulses felt by EMS upon arrival to ED but upon arrival to treatment room pt was pulseless again, CPR restarted at 1422. King airway in place, right tibia IO.

## 2019-06-29 NOTE — Code Documentation (Signed)
Patient time of death occurred at 41

## 2019-06-29 NOTE — Code Documentation (Signed)
Pulse check: no pulse, Dr Criss Alvine noting some cardiac activity on bedside ultrasound

## 2019-06-29 NOTE — ED Notes (Signed)
Family at bedside. 

## 2019-06-29 DEATH — deceased
